# Patient Record
Sex: Female | Born: 1950
Health system: Southern US, Community
[De-identification: ages and names within clinical notes are randomized; demographics above are authoritative.]

## PROBLEM LIST (undated history)

## (undated) DIAGNOSIS — R0600 Dyspnea, unspecified: Secondary | ICD-10-CM

## (undated) DIAGNOSIS — K449 Diaphragmatic hernia without obstruction or gangrene: Secondary | ICD-10-CM

## (undated) DIAGNOSIS — F1210 Cannabis abuse, uncomplicated: Secondary | ICD-10-CM

## (undated) DIAGNOSIS — F1410 Cocaine abuse, uncomplicated: Secondary | ICD-10-CM

## (undated) DIAGNOSIS — K319 Disease of stomach and duodenum, unspecified: Secondary | ICD-10-CM

## (undated) DIAGNOSIS — IMO0002 Reserved for concepts with insufficient information to code with codable children: Secondary | ICD-10-CM

## (undated) DIAGNOSIS — N186 End stage renal disease: Secondary | ICD-10-CM

## (undated) DIAGNOSIS — J449 Chronic obstructive pulmonary disease, unspecified: Secondary | ICD-10-CM

## (undated) DIAGNOSIS — E785 Hyperlipidemia, unspecified: Secondary | ICD-10-CM

## (undated) DIAGNOSIS — N179 Acute kidney failure, unspecified: Secondary | ICD-10-CM

## (undated) DIAGNOSIS — K222 Esophageal obstruction: Secondary | ICD-10-CM

## (undated) DIAGNOSIS — K859 Acute pancreatitis without necrosis or infection, unspecified: Secondary | ICD-10-CM

## (undated) DIAGNOSIS — I639 Cerebral infarction, unspecified: Secondary | ICD-10-CM

## (undated) DIAGNOSIS — N189 Chronic kidney disease, unspecified: Secondary | ICD-10-CM

## (undated) DIAGNOSIS — Z992 Dependence on renal dialysis: Secondary | ICD-10-CM

## (undated) DIAGNOSIS — M1990 Unspecified osteoarthritis, unspecified site: Secondary | ICD-10-CM

## (undated) DIAGNOSIS — K2980 Duodenitis without bleeding: Secondary | ICD-10-CM

## (undated) DIAGNOSIS — R531 Weakness: Secondary | ICD-10-CM

## (undated) DIAGNOSIS — B1920 Unspecified viral hepatitis C without hepatic coma: Secondary | ICD-10-CM

## (undated) DIAGNOSIS — M5412 Radiculopathy, cervical region: Secondary | ICD-10-CM

## (undated) DIAGNOSIS — K219 Gastro-esophageal reflux disease without esophagitis: Secondary | ICD-10-CM

## (undated) DIAGNOSIS — K3189 Other diseases of stomach and duodenum: Secondary | ICD-10-CM

## (undated) DIAGNOSIS — I10 Essential (primary) hypertension: Secondary | ICD-10-CM

## (undated) HISTORY — DX: Acute kidney failure, unspecified: N17.9

## (undated) HISTORY — DX: Reserved for concepts with insufficient information to code with codable children: IMO0002

## (undated) HISTORY — DX: Other diseases of stomach and duodenum: K31.89

## (undated) HISTORY — PX: REPAIR OF PERFORATED ULCER: SHX6065

## (undated) HISTORY — DX: Unspecified viral hepatitis C without hepatic coma: B19.20

## (undated) HISTORY — DX: Essential (primary) hypertension: I10

## (undated) HISTORY — DX: Unspecified osteoarthritis, unspecified site: M19.90

## (undated) HISTORY — DX: Diaphragmatic hernia without obstruction or gangrene: K44.9

## (undated) HISTORY — DX: Duodenitis without bleeding: K29.80

## (undated) HISTORY — DX: Esophageal obstruction: K22.2

## (undated) HISTORY — DX: Radiculopathy, cervical region: M54.12

## (undated) HISTORY — DX: Disease of stomach and duodenum, unspecified: K31.9

## (undated) HISTORY — DX: Acute pancreatitis without necrosis or infection, unspecified: K85.90

## (undated) HISTORY — DX: Hyperlipidemia, unspecified: E78.5

---
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
## 1977-06-09 HISTORY — PX: ABDOMINAL HYSTERECTOMY: SHX81

## 1985-06-09 DIAGNOSIS — B1920 Unspecified viral hepatitis C without hepatic coma: Secondary | ICD-10-CM

## 1985-06-09 HISTORY — DX: Unspecified viral hepatitis C without hepatic coma: B19.20

## 1988-06-09 DIAGNOSIS — IMO0002 Reserved for concepts with insufficient information to code with codable children: Secondary | ICD-10-CM

## 1988-06-09 HISTORY — DX: Reserved for concepts with insufficient information to code with codable children: IMO0002

## 1988-06-09 HISTORY — PX: REPAIR OF PERFORATED ULCER: SHX6065

## 1998-06-09 DIAGNOSIS — K859 Acute pancreatitis without necrosis or infection, unspecified: Secondary | ICD-10-CM

## 1998-06-09 HISTORY — DX: Acute pancreatitis without necrosis or infection, unspecified: K85.90

## 2003-05-27 DIAGNOSIS — F1210 Cannabis abuse, uncomplicated: Secondary | ICD-10-CM

## 2003-05-27 HISTORY — DX: Cannabis abuse, uncomplicated: F12.10

## 2003-06-04 ENCOUNTER — Emergency Department (HOSPITAL_COMMUNITY): Admission: EM | Admit: 2003-06-04 | Discharge: 2003-06-05 | Payer: Self-pay | Admitting: Internal Medicine

## 2003-06-18 ENCOUNTER — Emergency Department (HOSPITAL_COMMUNITY): Admission: EM | Admit: 2003-06-18 | Discharge: 2003-06-19 | Payer: Self-pay | Admitting: Emergency Medicine

## 2004-08-04 ENCOUNTER — Emergency Department (HOSPITAL_COMMUNITY): Admission: EM | Admit: 2004-08-04 | Discharge: 2004-08-04 | Payer: Self-pay | Admitting: Family Medicine

## 2004-08-17 ENCOUNTER — Emergency Department (HOSPITAL_COMMUNITY): Admission: EM | Admit: 2004-08-17 | Discharge: 2004-08-17 | Payer: Self-pay | Admitting: Emergency Medicine

## 2004-08-21 ENCOUNTER — Ambulatory Visit: Payer: Self-pay | Admitting: Family Medicine

## 2004-08-21 ENCOUNTER — Ambulatory Visit: Payer: Self-pay | Admitting: *Deleted

## 2004-12-20 ENCOUNTER — Emergency Department (HOSPITAL_COMMUNITY): Admission: EM | Admit: 2004-12-20 | Discharge: 2004-12-20 | Payer: Self-pay | Admitting: Emergency Medicine

## 2004-12-31 ENCOUNTER — Emergency Department (HOSPITAL_COMMUNITY): Admission: EM | Admit: 2004-12-31 | Discharge: 2005-01-01 | Payer: Self-pay | Admitting: Emergency Medicine

## 2005-04-27 ENCOUNTER — Emergency Department (HOSPITAL_COMMUNITY): Admission: EM | Admit: 2005-04-27 | Discharge: 2005-04-27 | Payer: Self-pay | Admitting: Emergency Medicine

## 2005-05-11 ENCOUNTER — Emergency Department (HOSPITAL_COMMUNITY): Admission: EM | Admit: 2005-05-11 | Discharge: 2005-05-12 | Payer: Self-pay | Admitting: *Deleted

## 2005-05-28 ENCOUNTER — Emergency Department (HOSPITAL_COMMUNITY): Admission: EM | Admit: 2005-05-28 | Discharge: 2005-05-28 | Payer: Self-pay | Admitting: Emergency Medicine

## 2006-06-09 DIAGNOSIS — I10 Essential (primary) hypertension: Secondary | ICD-10-CM

## 2006-06-09 HISTORY — DX: Essential (primary) hypertension: I10

## 2007-03-16 ENCOUNTER — Emergency Department (HOSPITAL_COMMUNITY): Admission: EM | Admit: 2007-03-16 | Discharge: 2007-03-16 | Payer: Self-pay | Admitting: Emergency Medicine

## 2007-03-30 ENCOUNTER — Emergency Department (HOSPITAL_COMMUNITY): Admission: EM | Admit: 2007-03-30 | Discharge: 2007-03-30 | Payer: Self-pay | Admitting: Emergency Medicine

## 2007-05-17 ENCOUNTER — Emergency Department (HOSPITAL_COMMUNITY): Admission: EM | Admit: 2007-05-17 | Discharge: 2007-05-17 | Payer: Self-pay | Admitting: Emergency Medicine

## 2007-11-18 ENCOUNTER — Emergency Department (HOSPITAL_COMMUNITY): Admission: EM | Admit: 2007-11-18 | Discharge: 2007-11-18 | Payer: Self-pay | Admitting: Emergency Medicine

## 2008-01-09 ENCOUNTER — Emergency Department (HOSPITAL_COMMUNITY): Admission: EM | Admit: 2008-01-09 | Discharge: 2008-01-09 | Payer: Self-pay | Admitting: Emergency Medicine

## 2008-02-06 ENCOUNTER — Emergency Department (HOSPITAL_COMMUNITY): Admission: EM | Admit: 2008-02-06 | Discharge: 2008-02-07 | Payer: Self-pay | Admitting: Emergency Medicine

## 2008-02-09 ENCOUNTER — Emergency Department (HOSPITAL_COMMUNITY): Admission: EM | Admit: 2008-02-09 | Discharge: 2008-02-09 | Payer: Self-pay | Admitting: Family Medicine

## 2008-02-17 ENCOUNTER — Emergency Department (HOSPITAL_COMMUNITY): Admission: EM | Admit: 2008-02-17 | Discharge: 2008-02-17 | Payer: Self-pay | Admitting: Emergency Medicine

## 2008-06-17 ENCOUNTER — Emergency Department (HOSPITAL_COMMUNITY): Admission: EM | Admit: 2008-06-17 | Discharge: 2008-06-17 | Payer: Self-pay | Admitting: Emergency Medicine

## 2008-06-20 ENCOUNTER — Emergency Department (HOSPITAL_COMMUNITY): Admission: EM | Admit: 2008-06-20 | Discharge: 2008-06-21 | Payer: Self-pay | Admitting: Emergency Medicine

## 2008-08-08 ENCOUNTER — Ambulatory Visit: Payer: Self-pay | Admitting: Cardiology

## 2008-08-08 ENCOUNTER — Inpatient Hospital Stay (HOSPITAL_COMMUNITY): Admission: EM | Admit: 2008-08-08 | Discharge: 2008-08-12 | Payer: Self-pay | Admitting: Emergency Medicine

## 2008-08-09 ENCOUNTER — Encounter (INDEPENDENT_AMBULATORY_CARE_PROVIDER_SITE_OTHER): Payer: Self-pay | Admitting: *Deleted

## 2008-08-09 ENCOUNTER — Encounter: Payer: Self-pay | Admitting: Internal Medicine

## 2008-08-09 LAB — CONVERTED CEMR LAB: TSH: 3.172 microintl units/mL

## 2008-08-17 ENCOUNTER — Ambulatory Visit: Payer: Self-pay | Admitting: Cardiology

## 2008-10-02 ENCOUNTER — Emergency Department (HOSPITAL_COMMUNITY): Admission: EM | Admit: 2008-10-02 | Discharge: 2008-10-02 | Payer: Self-pay | Admitting: Emergency Medicine

## 2008-10-21 ENCOUNTER — Emergency Department (HOSPITAL_COMMUNITY): Admission: EM | Admit: 2008-10-21 | Discharge: 2008-10-21 | Payer: Self-pay | Admitting: Emergency Medicine

## 2008-11-13 DIAGNOSIS — K5900 Constipation, unspecified: Secondary | ICD-10-CM | POA: Insufficient documentation

## 2008-11-13 DIAGNOSIS — I10 Essential (primary) hypertension: Secondary | ICD-10-CM | POA: Insufficient documentation

## 2008-11-13 DIAGNOSIS — K279 Peptic ulcer, site unspecified, unspecified as acute or chronic, without hemorrhage or perforation: Secondary | ICD-10-CM | POA: Insufficient documentation

## 2008-12-07 ENCOUNTER — Emergency Department (HOSPITAL_COMMUNITY): Admission: EM | Admit: 2008-12-07 | Discharge: 2008-12-07 | Payer: Self-pay | Admitting: Emergency Medicine

## 2009-01-02 ENCOUNTER — Emergency Department (HOSPITAL_COMMUNITY): Admission: EM | Admit: 2009-01-02 | Discharge: 2009-01-02 | Payer: Self-pay | Admitting: Emergency Medicine

## 2009-01-20 ENCOUNTER — Emergency Department (HOSPITAL_COMMUNITY): Admission: EM | Admit: 2009-01-20 | Discharge: 2009-01-20 | Payer: Self-pay | Admitting: Emergency Medicine

## 2009-02-16 ENCOUNTER — Encounter: Payer: Self-pay | Admitting: Cardiology

## 2009-02-23 ENCOUNTER — Emergency Department (HOSPITAL_COMMUNITY): Admission: EM | Admit: 2009-02-23 | Discharge: 2009-02-23 | Payer: Self-pay | Admitting: Emergency Medicine

## 2009-03-03 ENCOUNTER — Emergency Department (HOSPITAL_COMMUNITY): Admission: EM | Admit: 2009-03-03 | Discharge: 2009-03-03 | Payer: Self-pay | Admitting: Emergency Medicine

## 2009-04-08 ENCOUNTER — Emergency Department (HOSPITAL_COMMUNITY): Admission: EM | Admit: 2009-04-08 | Discharge: 2009-04-08 | Payer: Self-pay | Admitting: Emergency Medicine

## 2009-04-19 ENCOUNTER — Emergency Department (HOSPITAL_COMMUNITY): Admission: EM | Admit: 2009-04-19 | Discharge: 2009-04-19 | Payer: Self-pay | Admitting: Emergency Medicine

## 2009-06-11 ENCOUNTER — Emergency Department (HOSPITAL_COMMUNITY): Admission: EM | Admit: 2009-06-11 | Discharge: 2009-06-11 | Payer: Self-pay | Admitting: Emergency Medicine

## 2009-06-22 ENCOUNTER — Encounter (INDEPENDENT_AMBULATORY_CARE_PROVIDER_SITE_OTHER): Payer: Self-pay | Admitting: *Deleted

## 2009-06-22 LAB — CONVERTED CEMR LAB
ALT: 21 units/L
AST: 21 units/L
Albumin: 4.2 g/dL
Alkaline Phosphatase: 73 units/L
Bilirubin, Direct: 0.1 mg/dL
Cholesterol: 210 mg/dL
HDL: 57 mg/dL
LDL Cholesterol: 129 mg/dL
Total Protein: 7.2 g/dL
Triglycerides: 119 mg/dL

## 2009-06-24 ENCOUNTER — Emergency Department (HOSPITAL_COMMUNITY): Admission: EM | Admit: 2009-06-24 | Discharge: 2009-06-24 | Payer: Self-pay | Admitting: Emergency Medicine

## 2009-07-02 ENCOUNTER — Encounter: Payer: Self-pay | Admitting: Cardiology

## 2009-07-04 ENCOUNTER — Encounter (INDEPENDENT_AMBULATORY_CARE_PROVIDER_SITE_OTHER): Payer: Self-pay | Admitting: *Deleted

## 2009-07-11 ENCOUNTER — Emergency Department (HOSPITAL_COMMUNITY): Admission: EM | Admit: 2009-07-11 | Discharge: 2009-07-11 | Payer: Self-pay | Admitting: Emergency Medicine

## 2009-07-18 ENCOUNTER — Ambulatory Visit: Payer: Self-pay | Admitting: Cardiology

## 2009-07-18 DIAGNOSIS — R29818 Other symptoms and signs involving the nervous system: Secondary | ICD-10-CM | POA: Insufficient documentation

## 2009-07-18 DIAGNOSIS — E785 Hyperlipidemia, unspecified: Secondary | ICD-10-CM | POA: Insufficient documentation

## 2009-07-18 DIAGNOSIS — E782 Mixed hyperlipidemia: Secondary | ICD-10-CM | POA: Insufficient documentation

## 2009-08-01 ENCOUNTER — Ambulatory Visit: Payer: Self-pay | Admitting: Cardiology

## 2009-10-04 ENCOUNTER — Encounter (INDEPENDENT_AMBULATORY_CARE_PROVIDER_SITE_OTHER): Payer: Self-pay | Admitting: *Deleted

## 2009-12-19 ENCOUNTER — Encounter: Payer: Self-pay | Admitting: Adult Health

## 2009-12-24 ENCOUNTER — Ambulatory Visit: Payer: Self-pay | Admitting: Cardiology

## 2009-12-24 ENCOUNTER — Encounter (INDEPENDENT_AMBULATORY_CARE_PROVIDER_SITE_OTHER): Payer: Self-pay | Admitting: *Deleted

## 2009-12-24 DIAGNOSIS — F17200 Nicotine dependence, unspecified, uncomplicated: Secondary | ICD-10-CM | POA: Insufficient documentation

## 2009-12-25 LAB — CONVERTED CEMR LAB
ALT: 10 units/L (ref 0–35)
AST: 15 units/L (ref 0–37)
Albumin: 4.1 g/dL (ref 3.5–5.2)
Alkaline Phosphatase: 64 units/L (ref 39–117)
BUN: 11 mg/dL (ref 6–23)
Bilirubin, Direct: 0.1 mg/dL (ref 0.0–0.3)
CO2: 22 meq/L (ref 19–32)
Calcium: 10.1 mg/dL (ref 8.4–10.5)
Chloride: 107 meq/L (ref 96–112)
Cholesterol: 290 mg/dL — ABNORMAL HIGH (ref 0–200)
Creatinine, Ser: 1.12 mg/dL (ref 0.40–1.20)
Glucose, Bld: 99 mg/dL (ref 70–99)
HDL: 38 mg/dL — ABNORMAL LOW (ref >39)
Indirect Bilirubin: 0.2 mg/dL (ref 0.0–0.9)
LDL Cholesterol: 216 mg/dL — ABNORMAL HIGH (ref 0–99)
Potassium: 4.1 meq/L (ref 3.5–5.3)
Sodium: 141 meq/L (ref 135–145)
Total Bilirubin: 0.3 mg/dL (ref 0.3–1.2)
Total CHOL/HDL Ratio: 7.6
Total Protein: 7.6 g/dL (ref 6.0–8.3)
Triglycerides: 181 mg/dL — ABNORMAL HIGH (ref <150)
VLDL: 36 mg/dL (ref 0–40)

## 2010-07-11 NOTE — Assessment & Plan Note (Signed)
Summary: rov   Primary Provider:  None   History of Present Illness: [name] returns to the office as scheduled for continued assessment and treatment of hypertension, chest discomfort and hyperlipidemia.  She reports no new medical problems in recent months.  She has continuing lower back pain, which is her principal day to day issue.  This is exacerbated by movement and relieved by rest.  Tylenol provides some benefit, but Ultram has been more effective.  She is reluctant to try Naprosyn or ibuprofen, even with the addition of a PPI, due to her history of severe peptic ulcer disease.    She occasionally experiences chest discomfort that is poorly characterized and that passes spontaneously.  This may be associated with exertion and is accompanied by dyspnea.  She has had no nitroglycerin for these symptoms.    She has not follow blood pressures.  She is unaware of her lipid levels.  EKG  Procedure date:  12/24/2009  Findings:      Normal sinus rhythm Within normal limits No previous tracings for comparison.   Current Medications (verified): 1)  Amlodipine Besylate 10 Mg Tabs (Amlodipine Besylate) .... Take 1 Tablet By Mouth Once Daily 2)  Lisinopril 10 Mg Tabs (Lisinopril) .... Take 1 Tablet By Mouth Once Daily 3)  Pravastatin Sodium 80 Mg Tabs (Pravastatin Sodium) .... Take One Tablet By Mouth Daily At Bedtime 4)  Ultracet 37.5-325 Mg Tabs (Tramadol-Acetaminophen) .... Take 1 Tablet By Mouth Three Times A Day As Needed  Allergies: 1)  ! Asa 2)  ! * Ibuprofen  Past History:  PMH, FH, and Social History reviewed and updated.  Past Medical History: Chest discomfort; negative stress nuclear study in 2010 Hypertension Hyperlipidemia Tobacco abuse Gastric ulcer with perforation requiring surgical intervention in the 1980s  Social History: Tobacco Use - 50 pack years; continuing at 5 cigarettes per day Drug Use - yes cocaine, marijuana Disabled  unemployed  Review  of Systems       See history of present illness.  Vital Signs:  Patient profile:   60 year old female Weight:      163 pounds O2 Sat:      97 % on Room air Pulse rate:   79 / minute BP sitting:   129 / 81  (left arm)  Vitals Entered By: Jeani Hawking Via LPN (July 18, 624THL QA348G PM)  O2 Flow:  Room air  Physical Exam  General:   General-Well developed; no acute distress:   Neck-No JVD; no carotid bruits: Lungs-No tachypnea, no rales; no rhonchi; no wheezes: Cardiovascular-normal PMI; normal S1 and S2; modest systolic murmur Abdomen-BS normal; soft and non-tender without masses or organomegaly:  Musculoskeletal-No deformities, no cyanosis or clubbing: Neurologic-Normal cranial nerves; symmetric strength and tone:  Skin-Warm, no significant lesions: Extremities-Nl distal pulses; no edema:     Impression & Recommendations:  Problem # 1:  TOBACCO ABUSE (ICD-305.1) Patient encouraged once again to quit.  She is not inclined to utilize any pharmacologic aids at the present time.  Problem # 2:  MUSCULOSKELETAL PAIN (ICD-781.99) Prescription for Ultram renewed.  Lumbosacral spine films will be obtained.  We are attempting to locate a source for primary care for her, either the free clinic or a local physician's office.  Problem # 3:  HYPERLIPIDEMIA (P102836.4) LDL is in excess of 200, which is an indication for pharmacologic therapy.  Pravastatin 80 mg q.d. will be added to her medical regime with a repeat lipid profile in one month.  Problem # 4:  HYPERTENSION (ICD-401.9) Blood pressure control is excellent; current medications will be continued.  I will reevaluate this nice woman in 7 months.  Other Orders: T-Lumbar Spine Complete, 5 Views [id number]) Future Orders: T-Lipid Profile [id number] ... 01/24/2010  Patient Instructions: 1)  Your physician recommends that you schedule a follow-up appointment in: 7 months 2)  Your physician recommends that you return for lab work in: 1  month 3)  Your physician has recommended you make the following change in your medication: pravachol 80mg  daily, ultram 50mg  1-2 tablets three times a day as needed 4)  You have been referred to [facility] for referral to medicaid, free clinic referral also 5)  Your physician discussed the hazards of tobacco use.  Tobacco use cessation is recommended and techniques and options to help you quit were discussed. Prescriptions: ULTRACET 37.5-325 MG TABS (TRAMADOL-ACETAMINOPHEN) Take 1 tablet by mouth three times a day as needed  #90 x 6   Entered by:   Tye Savoy RN   Authorized by:   Yehuda Savannah, MD, [facility]   Signed by:   Tye Savoy RN on 12/24/2009   Method used:   Electronically to        [facility] (retail)       [place] [address]       [place], [place]  [address]       Ph: [phone]       Fax: [phone]   RxID:   [id number] ULTRAM 50 MG TABS (TRAMADOL HCL) 1-2 tablets by mouth three times a day as needed  #90 x 6   Entered by:   Tye Savoy RN   Authorized by:   Yehuda Savannah, MD, [facility]   Signed by:   Tye Savoy RN on 12/24/2009   Method used:   Electronically to        [facility] (retail)       [place] [address]       [place], [place]  [address]       Ph: [phone]       Fax: [phone]   RxID:   [id number] PRAVASTATIN SODIUM 80 MG TABS (PRAVASTATIN SODIUM) Take one tablet by mouth daily at bedtime  #30 x 6   Entered by:   Tye Savoy RN   Authorized by:   Yehuda Savannah, MD, [facility]   Signed by:   Tye Savoy RN on 12/24/2009   Method used:   Electronically to        [facility] (retail)       [place] [address]       [place], [place]  [address]       Ph: [phone]       Fax: [phone]   RxID:   [id number]

## 2010-07-11 NOTE — Miscellaneous (Signed)
Summary: lisinopril hctz refill  Clinical Lists Changes  Medications: Changed medication from LISINOPRIL-HYDROCHLOROTHIAZIDE 20-25 MG TABS (LISINOPRIL-HYDROCHLOROTHIAZIDE) Take 1 tablet by mouth once a day to LISINOPRIL-HYDROCHLOROTHIAZIDE 20-25 MG TABS (LISINOPRIL-HYDROCHLOROTHIAZIDE) Take 1 tablet by mouth once a day - Signed Rx of LISINOPRIL-HYDROCHLOROTHIAZIDE 20-25 MG TABS (LISINOPRIL-HYDROCHLOROTHIAZIDE) Take 1 tablet by mouth once a day;  #30 x 1;  Signed;  Entered by: Tye Savoy RN;  Authorized by: Renella Cunas, MD, [facility];  Method used: Electronically to [facility] [place], [place], [place], [place]  [address], Ph: [phone], Fax: [phone]    Prescriptions: LISINOPRIL-HYDROCHLOROTHIAZIDE 20-25 MG TABS (LISINOPRIL-HYDROCHLOROTHIAZIDE) Take 1 tablet by mouth once a day  #30 x 1   Entered by:   Tye Savoy RN   Authorized by:   Renella Cunas, MD, [facility]   Signed by:   Tye Savoy RN on 07/02/2009   Method used:   Electronically to        [facility] (retail)       [place] [address]       [place], [place]  [address]       Ph: [phone]       Fax: [phone]   RxID:   [id number]

## 2010-07-11 NOTE — Assessment & Plan Note (Signed)
Summary: PAST DUE FOR F/U PER TAMMY/TG   Visit Type:  Follow-up Primary Provider:  no primary m.d   History of Present Illness: Ashley [name] is a 60 y/o AAF who we are following with known history of hypertension. hyperlipidemia and chest pain.  She is a former cocaine abuser. She was admitted to [facility] in March of 2010 and was ruled out for MI with myovew study. She does not have a primary care provider and is in need of medication refills.  She states she is complaint with medications and has not used cocaine for 1 year. She continues to smoke but is cutting down. 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  Preventive Screening-Counseling & Management  Alcohol-Tobacco     Smoking Status: current     Smoking Cessation Counseling: yes  Current Medications (verified): 1)  Amlodipine Besylate 10 Mg Tabs (Amlodipine Besylate) .... Take 1 Tablet By Mouth Once Daily 2)  Lisinopril 10 Mg Tabs (Lisinopril) .... Take 1 Tablet By Mouth Once Daily 3)  Potassium Chloride Crys Cr 20 Meq Cr-Tabs (Potassium Chloride Crys Cr) .... Take 1 Tablet By Mouth Once A Day 4)  Flexeril 10 Mg Tabs (Cyclobenzaprine Hcl) .... Take 1 Tablet By Mouth Two Times A Day 5)  Ultram 50 Mg Tabs (Tramadol Hcl) .... Take 1 Tablet By Mouth Three Times A Day As Needed For Pain  Allergies (verified): 1)  ! Asa  Past History:  Past medical, surgical, family and social histories (including risk factors) reviewed, and no changes noted (except as noted below).  Past Medical History: Reviewed history from 11/13/2008 and no changes required. Current Problems:  CONSTIPATION (ICD-564.00) GASTRIC ULCER (ICD-531.90) HYPERTENSION (ICD-401.9) PMH-FH-SH reviewed-no changes except otherwise noted  Family History: Reviewed history and no changes required.  Social History: Reviewed history from 11/13/2008 and no changes required. Tobacco Use - Yes.  Drug Use - yes cocaine, marijuana Disabled   unemployed  Review of Systems       generalized fatigue , back pain All other systems have been reviewed and are negative unless stated above.    Vital Signs:  Patient profile:   60 year old female Height:      65 inches Weight:      172 pounds BMI:     28.73 Pulse rate:   87 / minute BP sitting:   91 / 57  (right arm)  Vitals Entered By: Doretha Sou, CNA (July 18, 2009 2:25 PM)  Physical Exam  General:  Well developed, well nourished, in no acute distress. Head:  normocephalic and atraumatic Eyes:  PERRLA/EOM intact; conjunctiva and lids normal.Glases  Lungs:  Clear bilaterally to auscultation and percussion. Heart:  Non-displaced PMI, chest non-tender; regular rate and rhythm, S1, S2 without murmurs, rubs or gallops. Carotid upstroke normal, no bruit. Normal abdominal aortic size, no bruits. Femorals normal pulses, no bruits. Pedals normal pulses. No edema, no varicosities. Abdomen:  Bowel sounds positive; abdomen soft and non-tender without masses, organomegaly, or hernias noted. No hepatosplenomegaly. Msk:  Back normal, normal gait. Muscle strength and tone normal. Extremities:  No clubbing or cyanosis. Neurologic:  Alert and oriented x 3. Psych:  Normal affect.   Impression & Recommendations:  Problem # 1:  HYPERTENSION (ICD-401.9) Her blood pressure is much lower than previously documeted. Rechecked manually found to be 110/60.  She is having some dizziness.  I will decrease her Lisinopril to 10mg  daily.  She will continue other medications as directed.  Will check  a BMET for kidney function with use of ACE and HCTZ Her updated medication list for this problem includes:    Amlodipine Besylate 10 Mg Tabs (Amlodipine besylate) .Marland Kitchen... Take 1 tablet by mouth once daily    Lisinopril 10 Mg Tabs (Lisinopril) .Marland Kitchen... Take 1 tablet by mouth once daily  Orders: T-Basic Metabolic Panel ([id number])  Problem # 2:  HYPERLIPIDEMIA (P102836.4) Will check lipids to ascertain  need for statin. Orders: T-Lipid Profile [id number]) T-Hepatic Function [id number])  Problem # 3:  MUSCULOSKELETAL PAIN (ICD-781.99) I have prescribed flexeril 10mg  three times a day as needed back spams and ultram for pain.  She requested Vicodin.  This was not provided.  Patient Instructions: 1)  Your physician recommends that you schedule a follow-up appointment in: 2 weeks for BP check and in 1 month with MD 2)  Your physician recommends that you return for lab work in: Next week 3)  Your physician has recommended you make the following change in your medication: Decrease Lisinopril to 10mg  by mouth once daily , start taking Flexeril and Ultram Prescriptions: LISINOPRIL 10 MG TABS (LISINOPRIL) take 1 tablet by mouth once daily  #30 x 6   Entered by:   Ashley [name] Via LPN   Authorized by:   Ashley Sims, NP   Signed by:   Ashley [name] Via LPN on 624THL   Method used:   Electronically to        [facility] (retail)       [place] [address] [place], [place]  [address]       Ph: [phone]       Fax: [phone]   RxID:   [id number] ULTRAM 50 MG TABS (TRAMADOL HCL) take 1 tablet by mouth three times a day as needed for pain  #60 x 0   Entered by:   Ashley [name] Via LPN   Authorized by:   Ashley Sims, NP   Signed by:   Ashley [name] Via LPN on 624THL   Method used:   Electronically to        [facility] (retail)       [place] [address] [place], [place]  [address]       Ph: [phone]       Fax: [phone]   RxID:   [id number] FLEXERIL 10 MG TABS (CYCLOBENZAPRINE HCL) take 1 tablet by mouth two times a day  #60 x 0   Entered by:   Ashley [name] Via LPN   Authorized by:   Ashley Sims, NP   Signed by:   Ashley [name] Via LPN on 624THL   Method used:   Electronically to        [facility] (retail)       [place] [address] [place], [place]  [address]       Ph: [phone]        Fax: [phone]   RxID:   [id number]

## 2010-07-11 NOTE — Miscellaneous (Signed)
Summary: labs lipids,liver,06/21/2009  Clinical Lists Changes  Observations: Added new observation of ALBUMIN: 4.2 g/dL (06/22/2009 15:20) Added new observation of PROTEIN, TOT: 7.2 g/dL (06/22/2009 15:20) Added new observation of SGPT (ALT): 21 units/L (06/22/2009 15:20) Added new observation of SGOT (AST): 21 units/L (06/22/2009 15:20) Added new observation of ALK PHOS: 73 units/L (06/22/2009 15:20) Added new observation of BILI DIRECT: 0.1 mg/dL (06/22/2009 15:20) Added new observation of LDL: 129 mg/dL (06/22/2009 15:20) Added new observation of HDL: 57 mg/dL (06/22/2009 15:20) Added new observation of TRIGLYC TOT: 119 mg/dL (06/22/2009 15:20) Added new observation of CHOLESTEROL: 210 mg/dL (06/22/2009 15:20)

## 2010-07-11 NOTE — Assessment & Plan Note (Signed)
Summary: BP CHECK  Nurse Visit   Vital Signs:  Patient profile:   60 year old female Height:      65 inches Weight:      173 pounds O2 Sat:      98 % on Room air Pulse rate:   99 / minute BP sitting:   106 / 79  (right arm)  Vitals Entered By: Doretha Sou, CNA (August 01, 2009 3:57 PM)  O2 Flow:  Room air  Current Medications (verified): 1)  Amlodipine Besylate 10 Mg Tabs (Amlodipine Besylate) .... Take 1 Tablet By Mouth Once Daily 2)  Lisinopril 10 Mg Tabs (Lisinopril) .... Take 1/2  Tablet By Mouth Once Daily 3)  Flexeril 10 Mg Tabs (Cyclobenzaprine Hcl) .... Take 1 Tablet By Mouth Two Times A Day 4)  Ultram 50 Mg Tabs (Tramadol Hcl) .... Take 1 Tablet By Mouth Three Times A Day As Needed For Pain  Allergies (verified): 1)  ! Asa   Visit Type:  bp check Primary Provider:  no primary m.d   History of Present Illness: shortness of breath on exertion, no other complaints, lisinopril was decreased at last ov to 10mg  daily

## 2010-07-11 NOTE — Letter (Signed)
Summary: [facility], general practice at [place]. [address] [place] [address]   Phone: [phone]  Fax: [phone]     December 24, 2009 MRN: [id number]   [facility] [address] [place], [place]  [address]      YOUR LAB WORK IS DUE   January 24, 2010  Please go to [facility], located across the street from [facility] on the second floor.  Hours are Monday - Friday 7am until 7:30pm         Saturday 8am until 12noon    _X_  DO NOT EAT OR DRINK AFTER MIDNIGHT EVENING PRIOR TO LABWORK  __ YOUR LABWORK IS NOT FASTING --YOU MAY EAT PRIOR TO LABWORK

## 2010-07-11 NOTE — Miscellaneous (Signed)
Summary: labs tsh 08/09/2008  Clinical Lists Changes  Observations: Added new observation of TSH: 3.172 microintl units/mL (08/09/2008 16:29)

## 2010-08-22 ENCOUNTER — Inpatient Hospital Stay (INDEPENDENT_AMBULATORY_CARE_PROVIDER_SITE_OTHER)
Admission: RE | Admit: 2010-08-22 | Discharge: 2010-08-22 | Disposition: A | Discharge status: Home / Self Care | Payer: Self-pay | Source: Ambulatory Visit | Attending: Family Medicine | Admitting: Family Medicine

## 2010-08-22 ENCOUNTER — Ambulatory Visit (INDEPENDENT_AMBULATORY_CARE_PROVIDER_SITE_OTHER): Payer: Self-pay

## 2010-08-22 DIAGNOSIS — M545 Low back pain, unspecified: Secondary | ICD-10-CM

## 2010-09-04 ENCOUNTER — Other Ambulatory Visit: Payer: Self-pay | Admitting: Cardiology

## 2010-09-05 NOTE — Telephone Encounter (Signed)
Richey °

## 2010-09-13 LAB — DIFFERENTIAL
Eosinophils Relative: 2 % (ref 0–5)
Lymphocytes Relative: 28 % (ref 12–46)
Monocytes Absolute: 0.7 10*3/uL (ref 0.1–1.0)
Monocytes Relative: 6 % (ref 3–12)
Neutro Abs: 7.1 10*3/uL (ref 1.7–7.7)

## 2010-09-13 LAB — COMPREHENSIVE METABOLIC PANEL
AST: 23 U/L (ref 0–37)
Albumin: 4.2 g/dL (ref 3.5–5.2)
Alkaline Phosphatase: 51 U/L (ref 39–117)
BUN: 23 mg/dL (ref 6–23)
Chloride: 107 mEq/L (ref 96–112)
Creatinine, Ser: 1.94 mg/dL — ABNORMAL HIGH (ref 0.4–1.2)
GFR calc Af Amer: 32 mL/min — ABNORMAL LOW (ref >60)
Potassium: 3.8 mEq/L (ref 3.5–5.1)
Total Protein: 8.1 g/dL (ref 6.0–8.3)

## 2010-09-13 LAB — CBC
HCT: 35.9 % — ABNORMAL LOW (ref 36.0–46.0)
Platelets: 349 10*3/uL (ref 150–400)
RDW: 13.2 % (ref 11.5–15.5)
WBC: 11.3 10*3/uL — ABNORMAL HIGH (ref 4.0–10.5)

## 2010-09-13 LAB — URINALYSIS, ROUTINE W REFLEX MICROSCOPIC
Bilirubin Urine: NEGATIVE
Glucose, UA: NEGATIVE mg/dL
Ketones, ur: NEGATIVE mg/dL
pH: 5.5 (ref 5.0–8.0)

## 2010-09-15 LAB — URINE MICROSCOPIC-ADD ON

## 2010-09-15 LAB — WET PREP, GENITAL: Yeast Wet Prep HPF POC: NONE SEEN

## 2010-09-15 LAB — GC/CHLAMYDIA PROBE AMP, GENITAL: Chlamydia, DNA Probe: NEGATIVE

## 2010-09-15 LAB — URINALYSIS, ROUTINE W REFLEX MICROSCOPIC
Bilirubin Urine: NEGATIVE
Glucose, UA: NEGATIVE mg/dL
Specific Gravity, Urine: 1.015 (ref 1.005–1.030)
pH: 6 (ref 5.0–8.0)

## 2010-09-19 LAB — BASIC METABOLIC PANEL
BUN: 11 mg/dL (ref 6–23)
BUN: 7 mg/dL (ref 6–23)
BUN: 8 mg/dL (ref 6–23)
CO2: 26 mEq/L (ref 19–32)
CO2: 28 mEq/L (ref 19–32)
Calcium: 8.8 mg/dL (ref 8.4–10.5)
Calcium: 9.1 mg/dL (ref 8.4–10.5)
Chloride: 104 mEq/L (ref 96–112)
Chloride: 105 mEq/L (ref 96–112)
Chloride: 107 mEq/L (ref 96–112)
Creatinine, Ser: 0.96 mg/dL (ref 0.4–1.2)
Creatinine, Ser: 0.97 mg/dL (ref 0.4–1.2)
GFR calc Af Amer: >60 mL/min (ref >60)
GFR calc non Af Amer: 59 mL/min — ABNORMAL LOW (ref >60)
Glucose, Bld: 84 mg/dL (ref 70–99)
Glucose, Bld: 84 mg/dL (ref 70–99)
Glucose, Bld: 96 mg/dL (ref 70–99)
Potassium: 3.3 mEq/L — ABNORMAL LOW (ref 3.5–5.1)
Potassium: 4 mEq/L (ref 3.5–5.1)
Sodium: 138 mEq/L (ref 135–145)

## 2010-09-19 LAB — DIFFERENTIAL
Basophils Absolute: 0 10*3/uL (ref 0.0–0.1)
Basophils Absolute: 0 10*3/uL (ref 0.0–0.1)
Basophils Relative: 1 % (ref 0–1)
Eosinophils Absolute: 0.4 10*3/uL (ref 0.0–0.7)
Eosinophils Absolute: 0.4 10*3/uL (ref 0.0–0.7)
Eosinophils Relative: 6 % — ABNORMAL HIGH (ref 0–5)
Eosinophils Relative: 6 % — ABNORMAL HIGH (ref 0–5)
Lymphocytes Relative: 50 % — ABNORMAL HIGH (ref 12–46)
Lymphs Abs: 3.1 10*3/uL (ref 0.7–4.0)
Lymphs Abs: 3.6 10*3/uL (ref 0.7–4.0)
Monocytes Absolute: 0.8 10*3/uL (ref 0.1–1.0)
Monocytes Absolute: 0.9 10*3/uL (ref 0.1–1.0)
Monocytes Relative: 11 % (ref 3–12)
Neutro Abs: 2.5 10*3/uL (ref 1.7–7.7)
Neutro Abs: 2.7 10*3/uL (ref 1.7–7.7)
Neutrophils Relative %: 34 % — ABNORMAL LOW (ref 43–77)

## 2010-09-19 LAB — POCT CARDIAC MARKERS
CKMB, poc: <1 ng/mL — ABNORMAL LOW (ref 1.0–8.0)
Myoglobin, poc: 48.6 ng/mL (ref 12–200)
Troponin i, poc: <0.05 ng/mL (ref 0.00–0.09)

## 2010-09-19 LAB — PHOSPHORUS: Phosphorus: 3.4 mg/dL (ref 2.3–4.6)

## 2010-09-19 LAB — CBC
HCT: 36.8 % (ref 36.0–46.0)
Hemoglobin: 12.5 g/dL (ref 12.0–15.0)
MCHC: 33.8 g/dL (ref 30.0–36.0)
MCV: 90.3 fL (ref 78.0–100.0)
MCV: 91.9 fL (ref 78.0–100.0)
Platelets: 291 10*3/uL (ref 150–400)
Platelets: 356 10*3/uL (ref 150–400)
RBC: 4.01 MIL/uL (ref 3.87–5.11)
RDW: 14 % (ref 11.5–15.5)
RDW: 14.4 % (ref 11.5–15.5)
WBC: 7.3 10*3/uL (ref 4.0–10.5)
WBC: 7.4 10*3/uL (ref 4.0–10.5)

## 2010-09-19 LAB — MAGNESIUM: Magnesium: 2.1 mg/dL (ref 1.5–2.5)

## 2010-09-19 LAB — CK TOTAL AND CKMB (NOT AT ARMC)
CK, MB: 1 ng/mL (ref 0.3–4.0)
Relative Index: INVALID (ref 0.0–2.5)
Total CK: 47 U/L (ref 7–177)

## 2010-09-19 LAB — CARDIAC PANEL(CRET KIN+CKTOT+MB+TROPI)
Relative Index: INVALID (ref 0.0–2.5)
Troponin I: <0.01 ng/mL (ref 0.00–0.06)

## 2010-09-19 LAB — LIPID PANEL
Cholesterol: 231 mg/dL — ABNORMAL HIGH (ref 0–200)
Total CHOL/HDL Ratio: 4.5 RATIO

## 2010-09-19 LAB — BRAIN NATRIURETIC PEPTIDE: Pro B Natriuretic peptide (BNP): <30 pg/mL (ref 0.0–100.0)

## 2010-09-19 LAB — D-DIMER, QUANTITATIVE: D-Dimer, Quant: 0.47 ug/mL-FEU (ref 0.00–0.48)

## 2010-09-23 LAB — POCT I-STAT, CHEM 8
Calcium, Ion: 1.18 mmol/L (ref 1.12–1.32)
Creatinine, Ser: 1.1 mg/dL (ref 0.4–1.2)
Glucose, Bld: 75 mg/dL (ref 70–99)
Hemoglobin: 14.6 g/dL (ref 12.0–15.0)
Sodium: 140 mEq/L (ref 135–145)
TCO2: 24 mmol/L (ref 0–100)

## 2010-10-12 ENCOUNTER — Other Ambulatory Visit: Payer: Self-pay | Admitting: Cardiology

## 2010-10-22 NOTE — H&P (Signed)
Ashley [name], Ashley [name]                 ACCOUNT NO.:  [id number]   MEDICAL RECORD NO.:  [id number]          PATIENT TYPE:  INP   LOCATION:  A315                          FACILITY:  [facility]   PHYSICIAN:  Salem Caster, DO    DATE OF BIRTH:  1950-08-24   DATE OF ADMISSION:  08/08/2008  DATE OF DISCHARGE:  03/06/2010LH                              HISTORY & PHYSICAL   CHIEF COMPLAINT:  Chest pain.   HISTORY OF PRESENT ILLNESS:  This is a 60 year old female who presented  with left-sided chest pain.  The patient states pain is sharp in nature,  located at left sternal border, it has been on and off for approximately  1 week with radiation to her back.  The patient states that the pain was  worse with deep inspiration and movement.  The patient denies any major  coughing.   PAST MEDICAL HISTORY:  1. Hypertension.  2. Gastric ulcer.  3. Constipation.   PAST SURGICAL HISTORY:  Hysterectomy.   SOCIAL HISTORY:  No history of drug abuse.  She is current smoker,  occasional drinker.   ALLERGIES:  IBUPROFEN and ASPIRIN.   HOME MEDICATIONS:  None.   REVIEW OF SYSTEMS:  CONSTITUTIONAL:  No weight gain, weight loss, fever,  or chills.  HEENT:  Unremarkable.  CARDIOVASCULAR:  Positive for chest  pain.  RESPIRATORY:  No shortness of breath, dyspnea, or wheezing.  GI:  Unremarkable.  GENITOURINARY:  Unremarkable.  MUSCULOSKELETAL:  Positive  for some back pain.  SKIN/NEUROLOGIC/PSYCHIATRIC:  Unremarkable.   PHYSICAL EXAMINATION:  CONSTITUTIONAL:  He is alert, well-developed,  well-nourished, well-hydrated in no acute distress.  HEENT:  Head is atraumatic and normocephalic.  Eyes, PERRL, EOMI.  NECK:  Supple, nontender, and nondistended.  CARDIOVASCULAR:  Regular rate and rhythm.  No murmurs, rubs, or gallops.  LUNGS:  Clear to auscultation bilaterally.  No rales, rhonchi, or  wheezing.  CHEST:  She has some tenderness in the left sternal border, reproducible  on exam.  ABDOMEN:  Soft,  nontender, and nondistended.  Positive bowel sounds.  EXTREMITIES:  No clubbing, cyanosis, or edema.  NEUROLOGICAL:  Cranial nerves II through XII grossly intact.  The  patient moves all extremities.  She is alert and oriented x3.  SKIN:  Normal, dry, and warm.   LABORATORY DATA:  Myoglobin 50.6, CK-MB less than 1, troponin less than  0.05, white count 7.4, hemoglobin 13.0, hematocrit 36.8, and platelet  count 356,000.  Her D-dimer is 0.47.  Sodium 138, potassium 4.0,  chloride 107, CO2 26, glucose 84, BUN 11, and creatinine is 1.09.  Chest  x-ray showed no acute abnormalities.   ASSESSMENT AND PLAN:  1. Chest pain.  The patient will be ruled out for acute myocardial      infarction.  The patient will have cardiology consult.  I will get      a total of 3 sets of cardiac markers.  We will repeat EKG in the      a.m. also.  The patient will be placed on IV pain medications for      any  pain.  The patient is allergic to ASPIRIN.  We will hold on any      aspirin-like products at this time.  The patient was also found to      be hypertensive, elevated blood pressures.  Medications will be      added for blood pressure control possibly nitroglycerin IV if      needed.  The patient has severe tobacco abuse.  The patient may      need a nicotine patch if needed and also counseling regarding      tobacco cessation.  2. The patient will be placed on deep vein thrombosis as well as      gastrointestinal prophylaxis.  The patient will be followed      closely.  She will be placed on ICU for her blood pressure control      and once this is under control, the patient will be transferred to      general medical bed.  We will await for cardiology recommendations      at this time.      Salem Caster, DO  Electronically Signed     SM/MEDQ  D:  08/13/2008  T:  08/13/2008  Job:  [id number]

## 2010-10-22 NOTE — Consult Note (Signed)
NAMESHAQUISE, Ashley [name]                 ACCOUNT NO.:  [id number]   MEDICAL RECORD NO.:  [id number]          PATIENT TYPE:  INP   LOCATION:  IC04                          FACILITY:  [facility]   PHYSICIAN:  Champ Mungo. Lovena Le, MD    DATE OF BIRTH:  1951/01/04   DATE OF CONSULTATION:  08/09/2008  DATE OF DISCHARGE:                                 CONSULTATION   PRIMARY CARE PHYSICIAN:  None.   REASON FOR CONSULTATION:  Chest pain.   HISTORY OF PRESENT ILLNESS:  Ashley [name] is a 60 year old female patient  with a history of hypertension who has been off medication for the last  3 years secondary to finances.  She relocated to this area from [place]  approximately 3 years ago.  She had been taking care of her aunt and her  sister.  She has noted left-sided chest discomfort off and on for the  last week.  It is not necessarily related to exertion.  She thinks it  gets worse with eating at times.  She also notes that it comes on when  she lies down.  It gets better with sitting up.  She denies any  radiating symptoms.  She does have associated shortness of breath that  is mild.  She denies any associated nausea but has had diaphoresis.  She  denies syncope, near syncope.  Her pain worsened, and she eventually  came to the emergency room yesterday.  Her cardiac markers have been  negative thus far.  Her chest x-ray demonstrates no acute disease, and  her EKG demonstrates sinus rhythm with a heart rate of 51, normal axis  and T-wave inversions in V5 and 6.  We are asked to further evaluate.   PAST MEDICAL HISTORY:  1. Hypertension.  2. Hyperlipidemia.  3. Peptic ulcer disease status post prior upper GI bleeding in the      1980s.   ALLERGIES:  NO KNOWN DRUG ALLERGIES.  She does not take aspirin or  NSAIDs secondary to her prior history of peptic ulcer disease.   MEDICATIONS AT HOME:  None.   SOCIAL HISTORY:  The patient lives in [place] with her aunt.  She has  a 30+ pack-year history of  smoking.  Continues to smoke a pack of  cigarettes every 2-days.  She denies alcohol abuse, but she does admit  to marijuana use, as well as occasional cocaine use.  She last used  cocaine about a week ago.  She is currently unemployed.   FAMILY HISTORY:  Her father died from colon cancer.   REVIEW OF SYSTEMS:  Please see HPI.  Denies fevers, chills, sore throat.  She has had a headache.  Denies rash.  Denies dysuria, hematuria.  Denies bright red blood per rectum or melena, although she did describe  some dark tarry stools about 2 months ago, but this resolved.  She does  have some belching symptoms and does describe some dysphagia.  She  denies odynophagia.  She has had some symptoms that are questionable for  paroxysmal nocturnal dyspnea, but she denies orthopnea.  She has had  some mild pedal edema.  She does note a cough that is nonproductive.  She denies syncope or near-syncope.  The rest of the review of systems  are negative.   PHYSICAL EXAMINATION:  GENERAL:  She is a well-nourished, well-developed  female in no acute distress.  VITAL SIGNS:  Blood pressure is 159/87 (her blood pressure did go up to  187/117 in the emergency room last night), pulse 54, respirations 14,  temperature 98.1, oxygen saturation 94% on room air.  HEENT:  Normal.  NECK:  Without JVD.  LYMPH:  Without lymphadenopathy.  ENDOCRINE:  Without thyromegaly.  CARDIAC:  Normal S1 and S2.  Regular rate and rhythm without murmur.  LUNGS:  Clear to auscultation bilaterally without wheezes, rhonchi or  rales.  SKIN:  Without rash.  ABDOMEN:  Soft with normoactive bowel sounds.  No organomegaly.  Questionable epigastric tenderness with palpation.  EXTREMITIES:  Without clubbing, cyanosis or edema.  MUSCULOSKELETAL:  Without joint deformity.  NEUROLOGIC:  She is alert and oriented x3.  Cranial nerves II-XII  grossly intact.  VASCULAR:  Questionable left carotid bruit.  Dorsalis pedis and  posterior tibialis  pulses are 2+ bilaterally.   CHEST X-RAY:  As outlined above.   ELECTROCARDIOGRAM:  As outlined above.   LABORATORY DATA:  White count 7300, hemoglobin 12.2, hematocrit 35.4,  platelet count 291,000.  Sodium 139, potassium 3.3, BUN 7, creatinine  0.97, glucose 95.  BNP less than 30.  D-dimer 0.47.  Point care markers  negative x2.   ASSESSMENT:  1. Atypical chest discomfort.  2. Uncontrolled hypertension.  3. Untreated dyslipidemia.  4. Cocaine abuse.  5. Tobacco abuse.  6. History of peptic ulcer disease status post prior upper      gastrointestinal bleeding many years ago.  7. Questionable left carotid bruit.   RECOMMENDATIONS:  The patient was also seen and examined by Dr. Cristopher Peru.  We will continue to check cardiac enzymes to rule out  myocardial infarction.  An echocardiogram will be obtained.  She has a  headache for nitroglycerin.  This will be discontinued, and she will be  placed on Norvasc 5 mg a day, as well as lisinopril 20 mg a day.  She  may benefit from the addition of a diuretic to her ACE inhibitor if her  blood pressure needs better control.  Her headache will be treated.  Will consider proceeding with a nuclear study in the next 24-48 hours  after her blood pressure is better controlled unless her enzymes return  positive.  She has been encouraged to stop tobacco and cocaine use.  Lipids will also be checked, as well as a TSH.  Will also obtain carotid  Dopplers to rule out significant carotid stenosis, given her  questionable left carotid bruit.   Thank you very much for the consultation.  We will glad to follow the  patient throughout the remainder of this admission.      Richardson Dopp, PA-C      Champ Mungo. Lovena Le, MD  Electronically Signed    SW/MEDQ  D:  08/09/2008  T:  08/09/2008  Job:  [id number]

## 2010-10-22 NOTE — Discharge Summary (Signed)
Ashley [name], Ashley [name]                 ACCOUNT NO.:  [id number]   MEDICAL RECORD NO.:  [id number]          PATIENT TYPE:  INP   LOCATION:  A315                          FACILITY:  [facility]   PHYSICIAN:  Bonnielee Haff, MD     DATE OF BIRTH:  May 07, 1951   DATE OF ADMISSION:  08/08/2008  DATE OF DISCHARGE:  03/06/2010LH                               DISCHARGE SUMMARY   PRIMARY MEDICAL DOCTOR:  The patient does not have a PMD.   DISCHARGE DIAGNOSES:  1. Left-sided chest pain, etiology unclear.  CT scan is pending.  2. Status post stress test likely negative, though full results not      available yet.  3. Uncontrolled hypertension, improved.  4. Tobacco abuse.   BRIEF HOSPITAL COURSE:  This is a 60 year old African American female  who has not seen a doctor in a while, who presented to the hospital with  complaints of left-sided chest pain.  The patient had  nonspecific EKG  findings in the form of T-wave inversions in V5-V6 which flipped up and  down.  The patient was found to have been hypertensive with blood  pressures greater than [id number] and diastolic greater than 0000000.  She was  started on p.o. blood pressure medications.  She had ruled out for acute  coronary syndrome.  She was seen by Cardiology and underwent a stress  test yesterday.  Unfortunately, we do not have the report available.  Dr. Lattie Haw was supposed to call with the results of the stress test,  but I am presuming that since she he did not call, it is most likely  negative.  She has ruled out.  So I think she is okay from that  standpoint to be discharged.  I have provided her with phone numbers for  Dr. Izell Kent office to call on Monday to see if she needs a followup  appointment with them or not.   In the meantime, she continues to have left-sided chest pain which she  mentions off and on that she has been having this for a few months.  She  is very vague about it.  She is always requesting higher doses of her  narcotics.  In order to rule out any other catastrophic process, I have  ordered a CT angio chest today.  If that is negative, she can be  discharged.  A D-dimer of note was negative.   With antihypertensive medications, her blood pressure is better  controlled now.  She will be discharged home on these medications.  On  the day of discharge, the patient is still having left-sided chest pain,  though she was sleeping comfortably before I went to the room.   PHYSICAL EXAMINATION:  VITAL SIGNS:  All stable.  Blood pressure is  148/81, saturation 96% on room air.  LUNGS:  Clear to auscultation.  CARDIOVASCULAR:  S1 and S2 normal, regular without any murmurs.  ABDOMEN:  Soft, nontender and nondistended, bowel sounds are present.  No masses or organomegaly are appreciated.  EXTREMITIES:  Show no edema.   LABORATORY DATA:  No labs available  today.  She is stable for discharge  if the CT is okay.   DISCHARGE MEDICATIONS:  1. Amlodipine 10 mg daily.  2. Hydrochlorothiazide 25 mg daily.  3. Lisinopril 20 mg daily.  4. KCl, that is potassium chloride 40 mEq daily.  5. Percocet 5/325 1 tablet every 4 hours as needed for pain, only 10      tablets will be prescribed.   IMPRESSION:  Etiology for the chest pain unclear, most likely  musculoskeletal, could be pleurisy although her chest x-ray did not show  any acute abnormalities.  We also thought of pericarditis, but there was  no pleural effusion noted on echo.  Cardiology did not feel this was  pericarditis.   FOLLOW UP:  1. Follow up with unassigned physician for August 08, 2008.  We will      provide her with a phone number she needs to call.  2. She needs to call Dr. Izell [place] office on Monday to see if she      needs to see them in their office regarding the results of the      stress test.   DIET:  Heart healthy.   PHYSICAL ACTIVITY:  No restrictions.   Smoking cessation counseling was provided.  Apparently she also has done   drugs in the past and this counseling was also provided.   Once the results of the CT are available, she can be discharged if this  is negative.   ADDENDUM: Ct Chest was negative for PE or other acute process. Patient  was subsequently discharged from the hospital. No aspirin as she is  intolerant.      Bonnielee Haff, MD  Electronically Signed     GK/MEDQ  D:  08/12/2008  T:  08/12/2008  Job:  [id number]   cc:   Cristopher Estimable. Lattie Haw, [place], [address]  [place], [place] [address]

## 2010-10-22 NOTE — Assessment & Plan Note (Signed)
[facility] OFFICE NOTE   NAME:Ashley [name], Ashley [name]                        MRN:          [id number]  DATE:08/17/2008                            DOB:          22-Jan-1951    CARDIOLOGIST:  Cristopher Estimable. Lattie Haw, MD, [facility]   PRIMARY CARE PHYSICIAN:  None.   REASON FOR VISIT:  Post-hospitalization followup.   HISTORY OF PRESENT ILLNESS:  Ashley [name] is a 60 year old female patient  with a history of hypertension, hyperlipidemia, peptic ulcer disease who  recently was evaluated in [facility] with chest pain.  She  ruled out for myocardial infarction by enzymes.  She underwent a stress  Myoview study.  She had suboptimal exercise with her heart rates  climbing to 78% of her maximal predicted heart rate.  Her images  demonstrated normal perfusion.  An echocardiogram demonstrated an EF of  60-65% with mild-to-moderate LVH.  She also underwent carotid Dopplers  secondary to a bruit and this demonstrated tortuous carotid systems  without definite hemodynamically significant stenosis.  There was an  elevated velocity.  It could be related to vessel tortuosity, but more  proximal plaque in the right CCA or innominate artery beyond sonographic  field cannot be excluded.  The patient was set up for chest CT angiogram  secondary to continuous chest pain by the hospitalist.  This  demonstrated no pulmonary embolism or thoracic aortic dissection.  She  did have coronary and aortic calcifications.  Of note, her chest CT  angiogram did demonstrate classic proximal brachiocephalic arterial  anatomy without proximal stenosis.  In the office today, she states she  is doing well.  She has not really had any recurrent chest pain.  She  denies any significant shortness of breath, orthopnea, PND, or pedal  edema.  Denies any syncope or near-syncope.  She does note some chest  discomfort with eating at times.  She has also had some  dysphagia, that  was mild.  She has been trying to take Pepcid as needed for dyspepsia.   CURRENT MEDICATIONS:  1. Potassium 20 mEq 2 tablets daily.  2. Hydrochlorothiazide 25 mg daily.  3. Lisinopril 20 mg daily.  4. Amlodipine 10 mg daily.   PHYSICAL EXAMINATION:  GENERAL:  She is a well-nourished well-developed  female in no acute distress.  VITAL SIGNS:  Blood pressure is 100/78, pulse 80, weight 158 pounds.  HEENT:  Normal.  NECK:  Without JVD.  CARDIAC:  S1, S2.  Regular rate and rhythm without murmur.  LUNGS:  Clear to auscultation bilaterally.  ABDOMEN:  Soft, nontender.  EXTREMITIES:  Without edema.  NEUROLOGIC:  She is alert and orient x3.  Cranial nerves II through XII  grossly intact.   ASSESSMENT AND PLAN:  1. Chest pain.  As noted above, the patient had a recent evaluation in      the hospital with a negative stress Myoview study and good left      ventricular function by echocardiogram with just mild-to-moderate      left ventricular hypertrophy.  This was in the setting of  uncontrolled hypertension.  This is improved now.  She does have      some history of peptic ulcer disease as well as evidence of      dyspepsia.  I have recommend that she remain on an H2 blocker on a      regular basis for now.  2. Hypertension.  This is much better controlled on her current      medical regimen.  We will refill her medicines and check a BMET      today to follow up on renal function and potassium.  3. Dyslipidemia.  In the hospital, her total cholesterol was 231,      triglycerides 125, HDL 51, LDL 155.  We will continue on      therapeutic lifestyle modifications.  We will eventually follow up      lipids to reassess her lipid control and decide whether or not she      needs therapy.  4. Tobacco abuse.  She is trying to quit.  5. Polysubstance abuse.  She has not used any further cocaine.   DISPOSITION:  The patient will be set up for followup in the next 3  months  or sooner p.r.n.  We have recommended that she obtain a primary  care physician.  We will try to refer her to [facility].  If  she has obtained a primary care physician by the time she follows up  with [place], we can likely set her up for p.r.n. followup after that.      Richardson Dopp, PA-C  Electronically Signed      Marijo Conception. Verl Blalock, MD, [facility]  Electronically Signed   SW/MedQ  DD: 08/17/2008  DT: 08/18/2008  Job #: [id number]

## 2010-11-26 ENCOUNTER — Other Ambulatory Visit: Payer: Self-pay | Admitting: Cardiology

## 2011-01-04 ENCOUNTER — Emergency Department (HOSPITAL_COMMUNITY)
Admission: EM | Admit: 2011-01-04 | Discharge: 2011-01-04 | Disposition: A | Discharge status: Home / Self Care | Payer: Self-pay | Attending: Emergency Medicine | Admitting: Emergency Medicine

## 2011-01-04 DIAGNOSIS — I10 Essential (primary) hypertension: Secondary | ICD-10-CM | POA: Insufficient documentation

## 2011-01-04 DIAGNOSIS — K029 Dental caries, unspecified: Secondary | ICD-10-CM | POA: Insufficient documentation

## 2011-01-04 DIAGNOSIS — K047 Periapical abscess without sinus: Secondary | ICD-10-CM | POA: Insufficient documentation

## 2011-01-10 ENCOUNTER — Ambulatory Visit (INDEPENDENT_AMBULATORY_CARE_PROVIDER_SITE_OTHER): Payer: Self-pay | Admitting: Family Medicine

## 2011-01-10 ENCOUNTER — Encounter: Payer: Self-pay | Admitting: Family Medicine

## 2011-01-10 VITALS — BP 116/77 | HR 80 | Temp 98.0°F | Ht 63.0 in | Wt 167.0 lb

## 2011-01-10 DIAGNOSIS — M545 Low back pain, unspecified: Secondary | ICD-10-CM

## 2011-01-10 DIAGNOSIS — I10 Essential (primary) hypertension: Secondary | ICD-10-CM | POA: Insufficient documentation

## 2011-01-10 DIAGNOSIS — B1920 Unspecified viral hepatitis C without hepatic coma: Secondary | ICD-10-CM | POA: Insufficient documentation

## 2011-01-10 DIAGNOSIS — E785 Hyperlipidemia, unspecified: Secondary | ICD-10-CM | POA: Insufficient documentation

## 2011-01-10 DIAGNOSIS — K047 Periapical abscess without sinus: Secondary | ICD-10-CM

## 2011-01-10 DIAGNOSIS — M549 Dorsalgia, unspecified: Secondary | ICD-10-CM | POA: Insufficient documentation

## 2011-01-10 LAB — LIPID PANEL
Cholesterol: 250 mg/dL — ABNORMAL HIGH (ref 0–200)
HDL: 45 mg/dL (ref >39)
LDL Cholesterol: 164 mg/dL — ABNORMAL HIGH (ref 0–99)
Triglycerides: 204 mg/dL — ABNORMAL HIGH (ref <150)
VLDL: 41 mg/dL — ABNORMAL HIGH (ref 0–40)

## 2011-01-10 LAB — COMPREHENSIVE METABOLIC PANEL
Alkaline Phosphatase: 56 U/L (ref 39–117)
BUN: 16 mg/dL (ref 6–23)
Creat: 1.24 mg/dL — ABNORMAL HIGH (ref 0.50–1.10)
Glucose, Bld: 80 mg/dL (ref 70–99)
Total Bilirubin: 0.3 mg/dL (ref 0.3–1.2)

## 2011-01-10 MED ORDER — HYDROCODONE-ACETAMINOPHEN 5-500 MG PO TABS: 1.0000 | ORAL_TABLET | Freq: Four times a day (QID) | ORAL | Status: DC | PRN

## 2011-01-10 MED ORDER — CYCLOBENZAPRINE HCL 10 MG PO TABS: 10.0000 mg | ORAL_TABLET | Freq: Three times a day (TID) | ORAL | Status: DC | PRN

## 2011-01-10 NOTE — Progress Notes (Signed)
  Subjective:    Patient ID: Ashley [name], female    DOB: [birth year], 60 y.o.   MRN: [id number]  HPI New pr here to establish care and discuss the following: 1. Back pain: x6 mos. Immediate pain following attempt to pull sister up from floor following an epileptic seizure. Sister is 167 lbs. She has some pain free days but experiences low and mid back pain since the incident. She has take tylenol and used heating pad with minimal symptom relief. She has not been evaluated for this pain in the past. She denies radicular symptoms, loss of bowel or bladder function.   2. Dental bscess: pt evaluated in ED last Sunday for R upper tooth pain. Diagnosed with abscess. Started on PCN and given Vicodin prn pain. She is now out of Vicodin. She was instructed to see a doctor for removal of the tooth. She has had multiple teeth removed at the free clinic in [place] last spring. She denies fever. She is still able to eat and drink w/o difficulty.  3. HTN: taking norvasc and lisinopril. No CP, SOB. LE edema. On medication x one year. 4. HLD: started on meds last December. Unsure how high cholesterol or LDL was.  5.  Preventative care: last mammogram unknown. Last colonoscopy   Review of Systems As per HPI    Objective:   Physical Exam  Nursing note and vitals reviewed. Constitutional: She appears well-developed and well-nourished.  HENT:  Head: Normocephalic and atraumatic.  Mouth/Throat: Oropharynx is clear and moist. Abnormal dentition. Dental abscesses and dental caries present. No oropharyngeal exudate, posterior oropharyngeal edema or tonsillar abscesses.         Poor dentition with multiple teeth missing.   Cardiovascular: Normal rate, regular rhythm and normal heart sounds.   Pulmonary/Chest: Effort normal and breath sounds normal.  Musculoskeletal:       Thoracic back: She exhibits tenderness and laceration. She exhibits normal range of motion, no swelling, no edema, no deformity, no pain, no  spasm and normal pulse.       Lumbar back: She exhibits decreased range of motion, tenderness and pain. She exhibits no bony tenderness, no swelling, no edema, no deformity, no laceration, no spasm and normal pulse.       Arms:      Negative straight leg raising test seated and lying.   Neurological: No cranial nerve deficit. Coordination normal.          Assessment & Plan:

## 2011-01-10 NOTE — Patient Instructions (Signed)
Ms. Urness,  Thank you for coming in. Please get your x-rays at [facility].  I will call you with the results and we will come up with a plan once they are done.  For your tooth please apply for [facility] and we can set up the referral.   -Dr. Adrian Blackwater

## 2011-01-14 ENCOUNTER — Emergency Department (HOSPITAL_COMMUNITY)
Admission: EM | Admit: 2011-01-14 | Discharge: 2011-01-14 | Disposition: A | Discharge status: Home / Self Care | Payer: Self-pay | Attending: Emergency Medicine | Admitting: Emergency Medicine

## 2011-01-14 DIAGNOSIS — K089 Disorder of teeth and supporting structures, unspecified: Secondary | ICD-10-CM | POA: Insufficient documentation

## 2011-01-14 DIAGNOSIS — K047 Periapical abscess without sinus: Secondary | ICD-10-CM | POA: Insufficient documentation

## 2011-01-14 DIAGNOSIS — I10 Essential (primary) hypertension: Secondary | ICD-10-CM | POA: Insufficient documentation

## 2011-01-14 NOTE — Assessment & Plan Note (Addendum)
Obtained FLP. Pt with elevated t chol and LDL. Goal for this patient would be LDL < 130.  Continue pravachol.

## 2011-01-14 NOTE — Assessment & Plan Note (Signed)
BP well controlled. Continue current regimen. Check labs (Cr).

## 2011-01-14 NOTE — Assessment & Plan Note (Signed)
Continue PCN. Refill Vicodin. Pt to get orange card/Ashley [name] so that she can visit dentist.

## 2011-01-14 NOTE — Assessment & Plan Note (Signed)
Suspect sprain. Concerning that pain has been present for 6 mos.  Will send pt for x-ray. Flexeril and Vicodin for dental abscess and back pain. Will review x-rays and base further plan off of imaging.

## 2011-01-17 ENCOUNTER — Ambulatory Visit (HOSPITAL_COMMUNITY)
Admission: RE | Admit: 2011-01-17 | Discharge: 2011-01-17 | Disposition: A | Discharge status: Home / Self Care | Payer: Self-pay | Source: Ambulatory Visit | Attending: Family Medicine | Admitting: Family Medicine

## 2011-01-17 DIAGNOSIS — M51379 Other intervertebral disc degeneration, lumbosacral region without mention of lumbar back pain or lower extremity pain: Secondary | ICD-10-CM | POA: Insufficient documentation

## 2011-01-17 DIAGNOSIS — M545 Low back pain, unspecified: Secondary | ICD-10-CM

## 2011-01-17 DIAGNOSIS — M5137 Other intervertebral disc degeneration, lumbosacral region: Secondary | ICD-10-CM | POA: Insufficient documentation

## 2011-02-10 ENCOUNTER — Ambulatory Visit (INDEPENDENT_AMBULATORY_CARE_PROVIDER_SITE_OTHER): Payer: Self-pay

## 2011-02-10 ENCOUNTER — Inpatient Hospital Stay (INDEPENDENT_AMBULATORY_CARE_PROVIDER_SITE_OTHER)
Admission: RE | Admit: 2011-02-10 | Discharge: 2011-02-10 | Disposition: A | Discharge status: Home / Self Care | Payer: Self-pay | Source: Ambulatory Visit | Attending: Family Medicine | Admitting: Family Medicine

## 2011-02-10 DIAGNOSIS — M25519 Pain in unspecified shoulder: Secondary | ICD-10-CM

## 2011-02-13 ENCOUNTER — Ambulatory Visit: Payer: Self-pay

## 2011-02-16 ENCOUNTER — Emergency Department (HOSPITAL_COMMUNITY): Payer: Self-pay

## 2011-02-16 ENCOUNTER — Inpatient Hospital Stay (HOSPITAL_COMMUNITY)
Admission: EM | Admit: 2011-02-16 | Discharge: 2011-02-19 | DRG: 552 | Disposition: A | Discharge status: Home / Self Care | Payer: Self-pay | Attending: Family Medicine | Admitting: Family Medicine

## 2011-02-16 DIAGNOSIS — M47812 Spondylosis without myelopathy or radiculopathy, cervical region: Secondary | ICD-10-CM | POA: Diagnosis present

## 2011-02-16 DIAGNOSIS — M5030 Other cervical disc degeneration, unspecified cervical region: Principal | ICD-10-CM | POA: Diagnosis present

## 2011-02-16 DIAGNOSIS — Z79899 Other long term (current) drug therapy: Secondary | ICD-10-CM

## 2011-02-16 DIAGNOSIS — K259 Gastric ulcer, unspecified as acute or chronic, without hemorrhage or perforation: Secondary | ICD-10-CM | POA: Diagnosis present

## 2011-02-16 DIAGNOSIS — F17200 Nicotine dependence, unspecified, uncomplicated: Secondary | ICD-10-CM | POA: Diagnosis present

## 2011-02-16 DIAGNOSIS — E876 Hypokalemia: Secondary | ICD-10-CM | POA: Diagnosis present

## 2011-02-16 DIAGNOSIS — IMO0002 Reserved for concepts with insufficient information to code with codable children: Secondary | ICD-10-CM

## 2011-02-16 DIAGNOSIS — N179 Acute kidney failure, unspecified: Secondary | ICD-10-CM | POA: Diagnosis present

## 2011-02-16 DIAGNOSIS — E785 Hyperlipidemia, unspecified: Secondary | ICD-10-CM | POA: Diagnosis present

## 2011-02-16 DIAGNOSIS — I10 Essential (primary) hypertension: Secondary | ICD-10-CM | POA: Diagnosis present

## 2011-02-16 LAB — CBC
MCHC: 36 g/dL (ref 30.0–36.0)
MCV: 86.5 fL (ref 78.0–100.0)
Platelets: 349 10*3/uL (ref 150–400)
RDW: 13.7 % (ref 11.5–15.5)
WBC: 11.3 10*3/uL — ABNORMAL HIGH (ref 4.0–10.5)

## 2011-02-16 LAB — DIFFERENTIAL
Basophils Absolute: 0 10*3/uL (ref 0.0–0.1)
Eosinophils Absolute: 0.2 10*3/uL (ref 0.0–0.7)
Eosinophils Relative: 2 % (ref 0–5)
Lymphs Abs: 4.3 10*3/uL — ABNORMAL HIGH (ref 0.7–4.0)
Monocytes Absolute: 1.2 10*3/uL — ABNORMAL HIGH (ref 0.1–1.0)

## 2011-02-16 LAB — BASIC METABOLIC PANEL
BUN: 19 mg/dL (ref 6–23)
CO2: 23 mEq/L (ref 19–32)
Calcium: 10.2 mg/dL (ref 8.4–10.5)
Chloride: 96 mEq/L (ref 96–112)
Creatinine, Ser: 1.58 mg/dL — ABNORMAL HIGH (ref 0.50–1.10)

## 2011-02-16 LAB — POCT I-STAT TROPONIN I

## 2011-02-17 ENCOUNTER — Inpatient Hospital Stay (HOSPITAL_COMMUNITY): Payer: Self-pay

## 2011-02-17 ENCOUNTER — Encounter: Payer: Self-pay | Admitting: Family Medicine

## 2011-02-17 DIAGNOSIS — N179 Acute kidney failure, unspecified: Secondary | ICD-10-CM

## 2011-02-17 DIAGNOSIS — M5412 Radiculopathy, cervical region: Secondary | ICD-10-CM

## 2011-02-17 DIAGNOSIS — F17200 Nicotine dependence, unspecified, uncomplicated: Secondary | ICD-10-CM

## 2011-02-17 DIAGNOSIS — I10 Essential (primary) hypertension: Secondary | ICD-10-CM

## 2011-02-17 LAB — COMPREHENSIVE METABOLIC PANEL
ALT: 25 U/L (ref 0–35)
Alkaline Phosphatase: 58 U/L (ref 39–117)
BUN: 16 mg/dL (ref 6–23)
CO2: 25 mEq/L (ref 19–32)
Chloride: 104 mEq/L (ref 96–112)
GFR calc Af Amer: 60 mL/min — ABNORMAL LOW (ref >60)
GFR calc non Af Amer: 49 mL/min — ABNORMAL LOW (ref >60)
Glucose, Bld: 102 mg/dL — ABNORMAL HIGH (ref 70–99)
Potassium: 3.6 mEq/L (ref 3.5–5.1)
Sodium: 138 mEq/L (ref 135–145)
Total Bilirubin: 0.3 mg/dL (ref 0.3–1.2)
Total Protein: 6.8 g/dL (ref 6.0–8.3)

## 2011-02-17 LAB — CK TOTAL AND CKMB (NOT AT ARMC): Total CK: 94 U/L (ref 7–177)

## 2011-02-17 LAB — LIPASE, BLOOD: Lipase: 36 U/L (ref 11–59)

## 2011-02-17 LAB — HEMOGLOBIN A1C: Mean Plasma Glucose: 126 mg/dL — ABNORMAL HIGH (ref <117)

## 2011-02-17 LAB — CBC
HCT: 37.9 % (ref 36.0–46.0)
Hemoglobin: 13.1 g/dL (ref 12.0–15.0)
MCHC: 34.6 g/dL (ref 30.0–36.0)
RBC: 4.35 MIL/uL (ref 3.87–5.11)
WBC: 9.7 10*3/uL (ref 4.0–10.5)

## 2011-02-17 LAB — CARDIAC PANEL(CRET KIN+CKTOT+MB+TROPI)
Total CK: 85 U/L (ref 7–177)
Troponin I: <0.3 ng/mL (ref <0.30)

## 2011-02-17 LAB — TROPONIN I: Troponin I: <0.3 ng/mL (ref <0.30)

## 2011-02-17 LAB — MRSA PCR SCREENING: MRSA by PCR: NEGATIVE

## 2011-02-17 MED ORDER — IOHEXOL 300 MG/ML  SOLN
100.0000 mL | Freq: Once | INTRAMUSCULAR | Status: AC | PRN
  Administered 2011-02-17: 100 mL via INTRAVENOUS

## 2011-02-17 NOTE — H&P (Signed)
[facility] Admission History and Physical  Patient name: Ashley [name] Medical record number: [id number] Date of birth: [birth year] Age: 60 y.o. Gender: female  Primary Care Provider: Boykin Nearing, MD  Chief Complaint: chest and Right shoulder pain History of Present Illness: Ashley [name] is a 60 y.o. year old female with PMH significant for HTN, HLD, Hepatitis C who is presenting with about a 2 week course of increasing Right shoulder pain and several day course of chest pain.  Regarding her shoulder pain, she describes pain alternating between aching and sharp shooting pains that radiate down her Right arm to her wrist.  She has been seen at Urgent Care for this on 02/10/11 and was diagnosed with generalized shoulder pain and treated with Vicodin and Flexeril.  Patient states this has not improved her pain.  Has not been able to sleep more than several hours at night due to pain.  Denies any weakness, only pain.  No numbness or paresthesias.  Regarding her chest pain, this started several days ago and has gradually been increasing in intensity as well.  Pain is described as aching, crushing pain in her chest that radiates through to her back.  She becomes diaphoretic and nauseous when she has this pain, and had several episodes of vomiting 2 nights ago but has had none since.  No hematemesis.  Pain medication does not seem to help pain in her chest and back.  Chest pain can occur at rest or during exertion, but has no consistent trigger.  No shortness of breath associated with chest pain.  Rest does not relieve her pain and she has never taken NTG.  No fevers, chills, abdominal pain, change in bowel habits, palpitations.    Patient Active Problem List  Diagnoses  . HYPERLIPIDEMIA  . TOBACCO ABUSE  . HYPERTENSION  . GASTRIC ULCER  . CONSTIPATION  . MUSCULOSKELETAL PAIN  . Hepatitis C  . Hyperlipidemia  . Hypertension  . Low back pain  . Dental abscess   Past  Medical History: Past Medical History  Diagnosis Date  . Ulcer 1990    gastric ulcer. Ruptured s/p emergency repair  . Arthritis   . Pancreatitis 2000    resolved  . 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 Hyperlipidemia   . Hypertension     Past Surgical History: Past Surgical History  Procedure Date  . Abdominal hysterectomy 1979    Social History:  Smokes 6-7 cigarettes per day.  Has smoked 1/2 - 1 ppd for past 40 years.  Denies any EtOH or illicit drug use.  History   Social History  . Marital Status: Widowed    Spouse Name: N/A    Number of Children: N/A  . Years of Education: N/A   Social History Main Topics  . Smoking status: Current Everyday Smoker -- 0.3 packs/day    Types: Cigarettes  . Smokeless tobacco: Never Used  . Alcohol Use: 0.0 oz/week    0 Cans of beer per week  . Drug Use: No  . Sexually Active: No   Other Topics Concern  . Not on file   Social History Narrative  . No narrative on file    Family History: Family History  Problem Relation Age of Onset  . Hypertension Father   . Cancer Father   . Hyperlipidemia Father   . Seizures Sister     Allergies: Allergies  Allergen Reactions  . Aspirin Other (See Comments)  Stomach ache  . Ibuprofen Other (See Comments)    Stomach ache    Current Outpatient Prescriptions  Medication Sig Dispense Refill  . amLODipine (NORVASC) 10 MG tablet take 1 tablet by mouth once daily  30 tablet  1  . cyclobenzaprine (FLEXERIL) 10 MG tablet Take 1 tablet (10 mg total) by mouth every 8 (eight) hours as needed for muscle spasms.  30 tablet  1  . HYDROcodone-acetaminophen (VICODIN) 5-500 MG per tablet Take 1 tablet by mouth every 6 (six) hours as needed for pain.  30 tablet  0  . lisinopril (PRINIVIL,ZESTRIL) 10 MG tablet TAKE ONE TABLET BY MOUTH EVERY DAY  30 tablet  3  . pravastatin (PRAVACHOL) 40 MG tablet Take 40 mg by mouth daily.        Review Of Systems: Per HPI with the following additions:  none Otherwise 12 point review of systems was performed and was unremarkable.  Physical Exam: Pulse: 114  Blood Pressure: 146/120 RR:     O2: 98 on RA Temp: 98.1  General: alert, cooperative, appears stated age and no distress HEENT: PERRLA, extra ocular movement intact, sclera clear, anicteric and neck supple with midline trachea.  Mucus membranes dry Heart: Tachycardic.  Regular rate.  No murmur.   Lungs: clear to auscultation, no wheezes or rales and unlabored breathing Abdomen: abdomen is soft without significant tenderness, masses, organomegaly or guarding Extremities: extremities normal, atraumatic, no cyanosis or edema MSK:  Right shoulder atraumatic in appearance. Patient with full active and passive ROM of Right shoulder, though does experience pain with movement.  Pulses intact distally.  Sensation normal Right arm and hand.  Positive Neer sign.  Positive empty can sign.  Right lumbar Paraspinal muscles tight and appear to be in spasm.  Palpable muscle cords present.  No spasm Left lumbar paraspinal muscles. Skin:no rashes, no ecchymoses Neurology: normal without focal findings, mental status, speech normal, alert and oriented x3, PERLA, cranial nerves 2-12 intact, muscle tone and strength normal and symmetric, reflexes normal and symmetric, sensation grossly normal and finger to nose and cerebellar exam normal  Labs and Imaging: Lab Results  Component Value Date/Time   NA 133* 02/16/2011  9:28 PM   K 3.4* 02/16/2011  9:28 PM   CL 96 02/16/2011  9:28 PM   CO2 23 02/16/2011  9:28 PM   BUN 19 02/16/2011  9:28 PM   CREATININE 1.58* 02/16/2011  9:28 PM   CREATININE 1.24* 01/10/2011  3:09 PM   GLUCOSE 130* 02/16/2011  9:28 PM   Lab Results  Component Value Date   WBC 11.3* 02/16/2011   HGB 15.9* 02/16/2011   HCT 44.2 02/16/2011   MCV 86.5 02/16/2011   PLT 349 02/16/2011  D-Dimer: 1.01  CXR:  No acute abnormality Right shoulder xray:  No acute abnormality  Assessment and Plan: Ashley [name] is a 60  y.o. year old female presenting with Right shoulder pain and chest pain, with concern for possible ACS though less likely: 1. Chest pain:  Present for past several days and increasing in intensity.  Atypical chest pain.  Plan to admit patient for ACS rule out, will obtain A1C and TSH.  FLP in 01/2011 revealed LDL of 160.  Her risk factors include her age, HTN, and smoking history.  Also concerning is the fact she experiences pain with deep inspiration, is tachycardic, and had an elevated D-dimer.  After speaking with radiology, patient's creatinine clearance is too high for CTA of chest.  Plan is  to rehydrate the patient overnight and recheck Cr in AM; she may qualify for CTA at that time versus possible V/Q scan.  Will start on full dose Lovenox regardless with eye towards stopping this if we can rule out PE.  Somewhat less likely due to no redness/edema of LE's.  Will also obtain EKG in AM to evaluate for any changes.  Unable to start patient on ASA due to allergy. 2.  Prior cardiac history:  Patient admitted in 2010 with similar symptoms.  Underwent cardiology referral at that time.   She underwent a stress Myoview study                which had suboptimal exercise with her heart rates   climbing to 78% of her maximal predicted heart rate.  Her images demonstrated normal perfusion.  An              echocardiogram demonstrated an EF of 60-65% with mild-to-moderate LVH at that time.  Unclear if she has continued to follow-up with cardiology.  If she rules out from cardiac standpoint today, likely hold on inpatient Cards consult and await outpatient.   2.  Right shoulder pain:  Positive Neer sign/positive supraspinatus testing.  Would favor rotator cuff tendinopathy as etiology of pain.  Does not seem to have any cervical spine or neuropathic pain.  Will attempt to treat with Morphine (along with helping chest pain) overnight with eye towards switching to PO medications in AM.  Would favor outpatient Sports Med  referral and workup.   3.  Back pain:  Appears to be spasm.  Will continue patient's Flexeril.  Other possible etiologies are pancreatitis, though she is not having abdominal pain, dissecting AAA, also less likely.  Will observe for improvement 4.  HTN:  SBP 140-170s in ED.  Narrow pulse pressure, which can be concerning for aortic stenosis.  No murmur auscultated, no others signs or symptoms of aortic stenosis.  Plan to continue patient's home Norvasc.  Will temporarily hold Lisinopril and start patient on beta blocker due to chest pain and somewhat elevated creatinine.   6.  Acute kidney injury:  Baseline creatinine appears to be between 0.9 and 1.  Patient very dry on exam, likely prerenal in nature.  She admits to decreased po intake of both solids and liquids due to arm pain and not feeling hungry/thirsty.  Hold on FeNa for now and attempt to rehydrate patient o/n, recheck Cr in AM.   7.  Question of hepatitis C:  This appears in her Epic problem list but cannot find past confirmation of this.  Consider Hepatitis testing inpatient versus outpatient and, if positive, referral to Hep C clinic. 8. HLD:  Diagnosed at recent outpatient visit.  Continue with statin. 9. Tobacco abuse:  Will obtain smoking cessation consult 10. FEN/GI: heart healthy diet 11. Prophylaxis: lovenox treatment dose 12. Disposition: pending further workup  Annabell Sabal MD [phone]

## 2011-02-18 LAB — BASIC METABOLIC PANEL
BUN: 11 mg/dL (ref 6–23)
Chloride: 109 mEq/L (ref 96–112)
Glucose, Bld: 128 mg/dL — ABNORMAL HIGH (ref 70–99)
Potassium: 3.6 mEq/L (ref 3.5–5.1)

## 2011-02-18 LAB — CBC
HCT: 37.6 % (ref 36.0–46.0)
Hemoglobin: 12.8 g/dL (ref 12.0–15.0)
WBC: 7.9 10*3/uL (ref 4.0–10.5)

## 2011-02-19 ENCOUNTER — Inpatient Hospital Stay (HOSPITAL_COMMUNITY): Payer: Self-pay

## 2011-02-24 NOTE — Discharge Summary (Signed)
Ashley Ashley [name], Ashley [name]                 ACCOUNT NO.:  [id number]  MEDICAL RECORD NO.:  [id number]  LOCATION:  [id number]                         FACILITY:  [facility]  PHYSICIAN:  Jamal Collin. Korry Dalgleish, M.D.DATE OF BIRTH:  Jul 04, 1950  DATE OF ADMISSION:  02/16/2011 DATE OF DISCHARGE:  02/19/2011                              DISCHARGE SUMMARY   PRIMARY CARE PHYSICIAN:  Boykin Nearing, MD at [facility] DIAGNOSIS:  Right-sided cervical radiculopathy.  SECONDARY DIAGNOSES: 1. Acute kidney injury - resolved. 2. Tobacco dependence. 3. Hyperlipidemia. 4. Hypertension. 5. Gastric ulcer disease.  Allergies: NSAIDS, ASA 2/2 previous history of gastric ulcer disease  HOSPITAL COURSE:  The patient presented to the ED on February 16, 2011, with right-sided chest pain of several days duration as well as right shoulder pain of 2 weeks duration.  Her chest pain was crushing radiating to back, was accompanied by nausea and vomiting.  Based on the risk factors of age, hyperlipidemia, hypertension, tobacco abuse, and was concerned for cardiac origin chest pain.  ADMISSION LABS:  D-dimer is 1.01.  Sodium 134, potassium 3.1, creatinine 1.58.  Her EKG was normal.  Her cardiac enzymes were normal.  Chest x- ray was normal and right shoulder x-ray was normal. For the differential diagnosis, we shifted to focus more on pulmonary embolism as a potential cause for this chest pain as well as concern for the acute kidney injury.  In order to rule out pulmonary embolism, a CT angiogram was ordered, and the patient was started on treatment dose of Lovenox.  For the acute kidney injury, the patient was started on IV fluids at 125 mL an hour and her home dose of lisinopril was held in favor of metoprolol. A CT angiogram of the chest showed no pulmonary embolism.  However, there were a few mildly prominent right hilar lymph nodes measuring up to 1.2 cm in short axis and noted to be nonspecific and  have increased in size since comparison examination in 2010.  The patient's repeat EKGs on the morning of February 17, 2011, also showed normal sinus rhythm and nonspecific T-wave abnormality.  At that time, however, the patient was still in significant pain, which she rated 10/10, which was being treated with morphine IV 2 mg q.2 h.. Therefore, we focused on a musculoskeletal origin of pain as apposed to cardiac and pulmonary.    We ordered a cervical x-ray as well as cervical spine MRI.  The cervical spine x-ray showed straightening of the usual lordosis severe diffuse degenerative disk disease, spondylosis, and facet degenerative changes; however, there has been no significant change since she had the same study in February 2011.  The MRIs of the cervical spine showed multilevel spondylosis contributing to mild central stenosis at C4 and C5 and moderate central stenosis at C5 and C6.  There was no cord deformity, abnormal cord signal.  There were also osteophytes and uncinate spurring and contributed to moderate biforaminal stenosis at C3-C4 and C4-C5, and several biforaminal stenosis at C5-C6.  There is an exiting nerve root encroachment at these levels as possible.  The thoracic spine MRI was read as small posterolateral disk protrusion on the left  at T10-T11.  No right-sided disk protrusion, cord deformity, or nerve root encroachment demonstrated.  Therefore, we concluded that the source of her significant neck, shoulder, and right scapula pain was due to cervical radiculopathy, and she was started on a steroid burst as well as gabapentin for the pain.  At the time of discharge, pain in the right shoulder and the scapula.  Chest, diminished a significantly to a 2/10 with improved range of motion of the right shoulder.  The patient denies shortness of breath.  LABORATORY DATA AT DISCHARGE:  BMP was within normal limits.  CBC was also within normal limits.  DISCHARGE  MEDICATIONS: 1. Cyclobenzaprine 10 mg every 8 hours as needed. 2. Gabapentin 300 mg by mouth daily. 3. Prednisone 60 mg daily x3 days for a total of 5 days steroid burst. 4. Omeprazole 20 mg p.o. daily. 5. Pravastatin 20 mg p.o. daily. 6. Amlodipine 10 mg p.o. daily. 7. Lisinopril 10 mg p.o. daily.  The patient will follow up with her primary care physician, Dr. Boykin Nearing at [facility] on February 26, 2011, at 10:00 a.m.  She will follow up with Sports Medicine on February 27, 2011 at 2:30 p.m.  FOLLOWUP ISSUES:  Pain control for cervical radiculopathy as well as continued treatment for hypertension, hyperlipidemia, and tobacco dependence.    ______________________________ Dewain Penning, MD   ______________________________ Jamal Collin. Andria Frames, M.D.    EW/MEDQ  D:  02/20/2011  T:  02/20/2011  Job:  [id number]  Electronically Signed by Dewain Penning  on 02/22/2011 09:57:33 AM Electronically Signed by Madison Hickman M.D. on 02/24/2011 03:33:36 PM

## 2011-02-26 ENCOUNTER — Encounter: Payer: Self-pay | Admitting: Family Medicine

## 2011-02-26 ENCOUNTER — Ambulatory Visit (INDEPENDENT_AMBULATORY_CARE_PROVIDER_SITE_OTHER): Payer: Self-pay | Admitting: Family Medicine

## 2011-02-26 DIAGNOSIS — M67919 Unspecified disorder of synovium and tendon, unspecified shoulder: Secondary | ICD-10-CM

## 2011-02-26 DIAGNOSIS — M75101 Unspecified rotator cuff tear or rupture of right shoulder, not specified as traumatic: Secondary | ICD-10-CM

## 2011-02-26 MED ORDER — OXYCODONE-ACETAMINOPHEN 5-325 MG PO TABS: 1.0000 | ORAL_TABLET | Freq: Four times a day (QID) | ORAL | Status: DC | PRN

## 2011-02-26 MED ORDER — GABAPENTIN 100 MG PO CAPS: 100.0000 mg | ORAL_CAPSULE | Freq: Three times a day (TID) | ORAL | Status: DC

## 2011-02-26 NOTE — Patient Instructions (Signed)
Ashley [name],  Thank you for coming in today. Please increase your gabapentin to 100 mg three times daily. Take your percocet when your pain is severe. I will be in touch regarding your physical therapy.   -Dr. Adrian Blackwater

## 2011-02-27 ENCOUNTER — Encounter: Payer: Self-pay | Admitting: Family Medicine

## 2011-02-27 ENCOUNTER — Ambulatory Visit (INDEPENDENT_AMBULATORY_CARE_PROVIDER_SITE_OTHER): Payer: Self-pay | Admitting: Family Medicine

## 2011-02-27 VITALS — BP 107/71 | HR 86 | Ht 63.0 in | Wt 166.0 lb

## 2011-02-27 DIAGNOSIS — M5412 Radiculopathy, cervical region: Secondary | ICD-10-CM

## 2011-02-27 DIAGNOSIS — M67919 Unspecified disorder of synovium and tendon, unspecified shoulder: Secondary | ICD-10-CM

## 2011-02-27 DIAGNOSIS — M719 Bursopathy, unspecified: Secondary | ICD-10-CM

## 2011-02-27 DIAGNOSIS — M75101 Unspecified rotator cuff tear or rupture of right shoulder, not specified as traumatic: Secondary | ICD-10-CM

## 2011-02-27 MED ORDER — PREDNISONE 10 MG PO TABS: ORAL_TABLET | ORAL | Status: AC

## 2011-02-27 NOTE — Patient Instructions (Signed)
Gabapentin Taper:  100 mg tablet three times a day for 1 week  Then increase to 200 mg at bedtime for 1 week (and 100 mg in the morning and around lunch)  Then increase to 200 mg in the morning and before bed for 1 week (and 100 mg in the morning and around lunch)  Then increase to 200 mg three times a day  F/u in 4 weeks

## 2011-02-28 ENCOUNTER — Encounter: Payer: Self-pay | Admitting: Family Medicine

## 2011-02-28 DIAGNOSIS — M75101 Unspecified rotator cuff tear or rupture of right shoulder, not specified as traumatic: Secondary | ICD-10-CM | POA: Insufficient documentation

## 2011-02-28 DIAGNOSIS — M5412 Radiculopathy, cervical region: Secondary | ICD-10-CM

## 2011-02-28 HISTORY — DX: Radiculopathy, cervical region: M54.12

## 2011-02-28 NOTE — Progress Notes (Signed)
Subjective:    Patient ID: Ashley [name], female    DOB: 1951/01/17, 60 y.o.   MRN: [id number]  HPI  Very pleasant patient who has been having some numbness and tingling in her shoulder blade and down through her arm away to her hand for approximately the last 3 weeks on the RIGHT. No trauma or injury. She has had a similar sensation in the past, but that was on the LEFT side. She does also complain some pain with abduction and some mild pain with internal range of motion. Some pain in the lateral shoulder as well. No history of fracture, dislocation, subluxation, or operative intervention in the neck or shoulder. She is currently not working.  She has been started on some gabapentin, and recently titrated up on this to 100 mg p.o. T.i.d.  MRI of the cervical spine is independently reviewed. 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 C5-6 does appear to be the worst level.  The PMH, PSH, Social History, Family History, Medications, and allergies have been reviewed in [facility], and have been updated if relevant.   Review of Systems  REVIEW OF SYSTEMS  GEN: No fevers, chills. Nontoxic. Primarily MSK c/o today. MSK: Detailed in the HPI GI: tolerating PO intake without difficulty Neuro: above Otherwise the pertinent positives of the ROS are noted above.      Objective:   Physical Exam   Physical Exam  Blood pressure 107/71, pulse 86, height 5\' 3"  (1.6 m), weight 166 lb (75.297 kg).  GEN: Well-developed,well-nourished,in no acute distress; alert,appropriate and cooperative throughout examination HEENT: Normocephalic and atraumatic without obvious abnormalities. Ears, externally no deformities PULM: Breathing comfortably in no respiratory distress EXT: No clubbing, cyanosis, or edema PSYCH: Normally interactive. Cooperative during the interview. Pleasant. Friendly and conversant. Not anxious  or depressed appearing. Normal, full affect.  R shoulder Inspection: No muscle wasting or winging Ecchymosis/edema: neg  AC joint, scapula, clavicle: NT Abduction: full, 5/5 - painful Flexion: full, 5/5 IR, full, lift-off: 5/5 ER at neutral: full, 5/5 AC crossover and compression: neg Neer: pos Hawkins: pos Drop Test: neg Empty Can: neg Supraspinatus insertion: pos Bicipital groove: NT Speed's: neg Yergason's: neg Sulcus sign: neg Scapular dyskinesis: none C5-T1 intact Sensation intact Grip 5/5  Cervical spine: There is a moderate degree of restriction with flexion extension and lateral bending, and more so with rotation. Patient is tender to palpation most in the paracervical region just distal to the occiput and around the muscular attachment. Trapezius is relatively calm. C5-T1 is intact for motor function and sensory function. Spurling's test is positive with symptoms on the RIGHT. Deep tendon reflexes are 2+ upper extremity.      Assessment & Plan:   1. Cervical radiculopathy  predniSONE (DELTASONE) 10 MG tablet  2. Rotator cuff syndrome of right shoulder      Primary problem seems to be coming from the patient's neck, probable nerve impingement which clinically goes along by exam and correlates with her MRI findings. They the patient's primary care provider as started things off well. I would continue to titrate up her gabapentin dose, and gave her instructions to do so. Other options include try cyclic antidepressants and Lyrica. It is reasonable for the patient to have some Percocet, Vicodin, or something similar like she does currently.  She is also having some relatively significant cough impingement on the RIGHT probably has some subacromial bursitis as well. Will try doing  a 10 day prednisone burst and taper to see if she has improvement from either of these symptoms with this.  We reviewed some cervical range of motion and some rotator cuff and scapular  stabilization exercises with the patient. She will benefit from physical therapy, and is going to call us once her [place] is set up.

## 2011-03-03 NOTE — Assessment & Plan Note (Signed)
A: R shoulder pain 2/2 to cervical radiculopathy. P: increase gabapentin as tolerated. Percocet prn. PT.

## 2011-03-03 NOTE — Progress Notes (Signed)
  Subjective:    Patient ID: Ashley [name], female    DOB: 02-09-1951, 60 y.o.   MRN: [id number]  HPI Here for HFU following admission for R shoulder pain. She was admitted to rule out ACS since she has a cardiac history. She was found to have cervical radiculopathy. She reports persistent pain. She feels that the gabapentin is causing her to feel shaky. She denies fainting or falling. She is compliant with her medication. She has her first PT visit scheduled for tomorrow.   Review of Systems As per HPI    Objective:   Physical Exam Gen: alert, NAD CV: SIS2, RRR R upper Ext: full ROM. Pain with palpation of R c-spine and rotation. Grip full. Sensation intact.        Assessment & Plan:

## 2011-03-03 NOTE — H&P (Signed)
Ashley [name], Ashley [name]                 ACCOUNT NO.:  [id number]  MEDICAL RECORD NO.:  [id number]  LOCATION:  [facility]                         FACILITY:  [facility]  PHYSICIAN:  Jamal Collin. Hensel, M.D.DATE OF BIRTH:  May 14, 1951  DATE OF ADMISSION:  02/16/2011 DATE OF DISCHARGE:                             HISTORY & PHYSICAL   PRIMARY CARE PROVIDER:  Boykin Nearing, MD, at [facility].  CHIEF COMPLAINT:  Chest pain and right shoulder pain.  HISTORY OF PRESENT ILLNESS:  [name] is a 60 year old female with past medical history significant for hypertension, hyperlipidemia, and hepatitis C who is presenting with about a 2-week course of increasing right shoulder pain and several-day course of chest pain.  Regarding her shoulder pain which describes pain alternating between aching and sharp shooting pains that radiates down her right arm to her wrist.  She has been seen at Urgent Care for this on February 10, 2011, and was diagnosed with generalized shoulder pain and treated Vicodin and Flexeril.  The patient states this has not improved her pain.  She has not been able to sleep more than several hours at night due to her pain. Denies any weakness, only complains of pain.  No numbness or paresthesias.  Regarding her chest pain, it started several days ago and has been gradually increasing intensity as well.  Pain is described as aching, crushing pain in her chest that radiates through to her back. She becomes diaphoretic and nauseous when she has this pain and had several episodes of vomiting 2 nights ago, but has had none since.  No hematemesis.  Pain medications do not seem to help pain in her chest and back.  Chest pain can occur at rest or during exertion, but has no persistent trigger.  No shortness of breath associated with chest pain. She does have some pain on deep inspiration.  Rest does not relieve her pain and she has never taken nitroglycerin.  No fevers,  chills, abdominal pain, change of bowel habits, or palpitations.  PAST MEDICAL HISTORY: 1. Hyperlipidemia. 2. Tobacco abuse. 3. Hypertension. 4. Gastric ulcer, status post emergency repair in 1990. 5. Question of hepatitis C. 6. History of pancreatitis.  PAST SURGICAL HISTORY:  Abdominal hysterectomy in 1979.  FAMILY HISTORY:  Denies.  SOCIAL HISTORY:  The patient smokes 6-7 cigarettes per day.  She smoked 1/2-1 pack per day for the past 40 years.  She denies any ethanol or illicit drug use.  She does live at home by herself.  ALLERGIES: 1. ASPIRIN. 2. IBUPROFEN.  MEDICATIONS: 1. Amlodipine 10 mg p.o. daily. 2. Flexeril 10 mg p.o. q.8 h. p.r.n. muscle spasm. 3. Vicodin 5/500 one tablet p.o. q.6 h. p.r.n. pain. 4. Lisinopril 10 mg p.o. daily. 5. Pravachol 40 mg p.o. daily.  REVIEW OF SYSTEMS:  A 12-point review of systems completed and negative except for HPI.  PHYSICAL EXAMINATION:  VITAL SIGNS:  Pulse 114, blood pressure 146/120, respiratory rate 18, O2 sats 98% on room air, and temperature is 98.1. GENERAL:  Alert and cooperative patient, appears her stated age, in no distress. HEENT:  Pupils are equal and reactive to light.  Extraocular movements are intact.  Sclerae are clear, anicteric. NECK:  Supple with midline trachea.  Mucous membranes dry. HEART:  Tachycardic with regular rate.  No murmur. LUNGS:  Clear to auscultation without wheezes or rales and no labored breathing. ABDOMEN:  Soft without significant tenderness, masses, or guarding. EXTREMITIES:  Normal and atraumatic without cyanosis or edema.  No erythema. MUSCULOSKELETAL:  Right shoulder atraumatic in appearance.  The patient has full active and passive range of motion of the right shoulder. There, she does experience pain with movement.  Pulses intact distally. Sensation is normal in right arm and hands.  Positive Neer sign. Positive empty can sign.  Right lumbar paraspinal muscles is tight  and appear to be in spasm.  Palpable muscle cords present.  No spasm in the left lumbar paraspinal muscles. SKIN:  No rashes.  No ecchymosis. NEUROLOGY:  Alert and oriented x3.  Mental status and speech normal. Muscle tone and strength normal and symmetric in bilateral upper and lower extremities.  Reflexes normal and symmetric.  Sensation grossly normal throughout.  Finger-nose normal and cerebellar exam normal.  LABORATORY DATA:  Sodium 132, potassium 3.4, chloride 96, CO2 of 23, BUN 19, creatinine 1.58, and glucose 130.  CBC showed white blood cells 11.3, hematocrit 44.2, hemoglobin 51.9, and platelets 349.  D-dimers 1.01.  IMAGING: 1. Chest x-ray showed no acute abnormality. 2. Right shoulder x-ray showed no acute abnormality.  ASSESSMENT/PLAN:  [name] is a 60 year old female presenting with right shoulder pain and chest pain that is concerning for possibly acute coronary syndrome, but this is less likely. 1. Chest pain present over the past several days and increasing in     intensity.  Atypical chest pain.  Plan to admit the patient for ACS     rule out.  We will obtain A1c and TSH.  Fasting lipid panel in     August 2012 revealed LDL of 160.  Her risk factors include age,     hypertension, hyperlipidemia, and smoking history.  Also concern is     that she experienced pain with deep inspiration, is tachycardic,     and had elevated D-dimer.  After speaking with Radiology, as the     patient's creatinine clearance is 2, could not order CTA of her chest.     Plan to rehydrate the patient overnight and recheck creatinine in     the a.m.  She may have qualified for CTA at that time versus     possible V/Q scan.  Plan to start with full-dose Lovenox regardless     with an eye toward stopping this, so we can rule out PE.  Somewhat     less likely due to no redness or edema in the lower extremities.     Not tachypneic.  We will also obtain EKG in the morning to evaluate     for  any changes.  Unable to start the patient on ASPIRIN due to     allergy. 2. Prior cardiac history.  The patient admitted at recently with similar     symptoms.  No cardiology referral at that time.  She underwent a     stress Myoview study which had suboptimal exercise with heart rate     parameter at 78% with maximum reduced heart rate.  Her images     demonstrated normal perfusion.  Echocardiogram at that time showed     EF of 60-65% with mild to moderate LVH.  Unclear if she has     continued to  have followup with Cardiology.  If she rules out from     a cardiac standpoint today, likely hold on the patient's cardiology     consult and await outpatient followup. 3. Right shoulder pain.  Positive Neer sign positive and positive     supraspinatus testing.  We will favor rotator cuff tendinopathy as     the etiology of her pain.  Does not seem to have any cervical spine     or neuropathic pain.  We will attempt to treat with morphine (which     can help her chest pain as well) overnight with an eye towards to     p.o. medications in the a.m.  We will favor outpatient physical     therapy referral and workup. 4. Back pain with increased spasm.  We will continue the patient's     Flexeril.  Other possible etiologies are pancreatitis, although she     is not having abdominal pain.  Dissecting abdominal aortic aneurysm     also possibility though also less likely.  We will observe for     improvement. 5. Hypertension.  Systolic blood pressures [id number] in the ED.  She     has an elevated blood pressure which can be concerning for aortic     stenosis, however, no murmurs auscultated.  She had no signs of     aortic stenosis on echocardiogram in 2010.  She has no other signs     or symptoms of aortic stenosis.  Plan to continue the patient's     home Norvasc.  We will temporarily hold her lisinopril and start     the patient on beta-blocker due to her chest pain and somewhat     elevated  creatinine. 6. Acute kidney injury.  Baseline creatinine appears to be between 0.9-     1.  The patient's renal exam likely prerenal in nature.  She admits     to decreased p.o. intake with solids and liquids due to arm pain     and just generally feeling hungry and thirsty over the past couple     of days.  We will hold on FENa for now, attempt to rehydrate the     patient overnight and recheck her creatinine in the morning. 7. Question of hepatitis C.  This appears in her EPIC problem list,     but I cannot find any past confirmation of this.  Consider     hepatitis testing inpatient verus outpatient and if positive     referral to [facility]. 8. Hyperlipidemia, diagnosed at recent outpatient visit.  Plan to     continue statin. 9. Tobacco abuse.  We will attempt smoking cessation consult. 10.Fluids, electrolytes, nutrition and gastrointestinal.  Heart     healthy diet. 11.Prophylaxis.  Full-dose Lovenox. 12.Disposition.  Pending further workup.     Annabell Sabal, MD   ______________________________ Jamal Collin Andria Frames, M.D.    JW/MEDQ  D:  02/17/2011  T:  02/17/2011  Job:  [id number]  Electronically Signed by Annabell Sabal  on 02/26/2011 03:34:01 PM Electronically Signed by Madison Hickman M.D. on 03/03/2011 09:05:08 AM

## 2011-03-06 ENCOUNTER — Telehealth: Payer: Self-pay | Admitting: Family Medicine

## 2011-03-06 NOTE — Telephone Encounter (Signed)
Ashley [name] is calling because she thought that Dr. Adrian Blackwater was prescribing Flexeril and Percocet.  When she went to the Pharmacy to pick them up, the Percocet was not there.  She said the Flexeril is not working on its own and she wants to know if she can get the Flexeril.

## 2011-03-06 NOTE — Telephone Encounter (Signed)
Pt states that she has taken all of the percocet she was given on the 19th.  Would like a refill on these.  Will forward to Dr. Candace Cruise park as she is covering Dr. Adrian Blackwater box.  Will also route to Dr Adrian Blackwater.  Pt informed [place], Salome Spotted

## 2011-03-07 LAB — WET PREP, GENITAL
Trich, Wet Prep: NONE SEEN
Yeast Wet Prep HPF POC: NONE SEEN

## 2011-03-07 LAB — URINALYSIS, ROUTINE W REFLEX MICROSCOPIC
Ketones, ur: 15 — AB
Leukocytes, UA: NEGATIVE
Nitrite: NEGATIVE
Protein, ur: 100 — AB

## 2011-03-07 LAB — URINE MICROSCOPIC-ADD ON

## 2011-03-07 MED ORDER — OXYCODONE-ACETAMINOPHEN 5-325 MG PO TABS: 1.0000 | ORAL_TABLET | Freq: Three times a day (TID) | ORAL | Status: DC | PRN

## 2011-03-07 NOTE — Telephone Encounter (Signed)
Spoke with patient who has right rotator cuff tendonitis/nerve impingement. Seen by Dr. Adrian Blackwater 09/19 and @ Sports Med 09/20.  Next doctor's appointment ([facility]) is October 19th.  Taking 100mg  gabapentin tid and percocet tid (with gapapentin). Advised take 2 tablets gapapentin tid today then 3 tablets tid tomorrow if tolerating.  Will give Rx for percocet for 20 tablets for now. Hopefully increasing the gabapentin will decrease need for this.

## 2011-03-20 LAB — URINALYSIS, ROUTINE W REFLEX MICROSCOPIC
Bilirubin Urine: NEGATIVE
Nitrite: POSITIVE — AB
Specific Gravity, Urine: 1.025
pH: 5.5

## 2011-03-20 LAB — URINE MICROSCOPIC-ADD ON

## 2011-03-24 ENCOUNTER — Other Ambulatory Visit: Payer: Self-pay | Admitting: Cardiology

## 2011-03-27 ENCOUNTER — Ambulatory Visit: Payer: Self-pay | Admitting: Family Medicine

## 2011-06-05 ENCOUNTER — Other Ambulatory Visit: Payer: Self-pay | Admitting: Cardiology

## 2011-06-07 ENCOUNTER — Other Ambulatory Visit: Payer: Self-pay | Admitting: Cardiology

## 2011-06-09 ENCOUNTER — Telehealth: Payer: Self-pay | Admitting: Family Medicine

## 2011-06-09 NOTE — Telephone Encounter (Signed)
Dr. Adrian Blackwater - patient called asking for a refill of Lisinopril.  She says that a doctor in [place] was the last to refill it, but would like you to prescribe it from now on.  She has been out of the medication for several days.  I told patient that the [facility] Emergency Line does not send prescriptions over the phone, but I would send you a message.  Patient denies any HA, fatigue, chest pain, nausea/vomiting.  Advised her to go to Urgent Care if she develops symptoms.

## 2011-06-11 ENCOUNTER — Other Ambulatory Visit: Payer: Self-pay | Admitting: *Deleted

## 2011-06-11 MED ORDER — LISINOPRIL 10 MG PO TABS: 10.0000 mg | ORAL_TABLET | Freq: Every day | ORAL | Status: DC

## 2011-08-01 ENCOUNTER — Encounter: Payer: Self-pay | Admitting: Family Medicine

## 2011-08-01 ENCOUNTER — Ambulatory Visit (INDEPENDENT_AMBULATORY_CARE_PROVIDER_SITE_OTHER): Payer: Self-pay | Admitting: Family Medicine

## 2011-08-01 DIAGNOSIS — W19XXXA Unspecified fall, initial encounter: Secondary | ICD-10-CM

## 2011-08-01 DIAGNOSIS — M545 Low back pain, unspecified: Secondary | ICD-10-CM

## 2011-08-01 MED ORDER — NAPROXEN 500 MG PO TABS: 500.0000 mg | ORAL_TABLET | Freq: Two times a day (BID) | ORAL | Status: DC

## 2011-08-01 MED ORDER — CYCLOBENZAPRINE HCL 10 MG PO TABS: 10.0000 mg | ORAL_TABLET | Freq: Three times a day (TID) | ORAL | Status: DC | PRN

## 2011-08-01 MED ORDER — OXYCODONE-ACETAMINOPHEN 5-325 MG PO TABS: 1.0000 | ORAL_TABLET | Freq: Four times a day (QID) | ORAL | Status: DC | PRN

## 2011-08-01 NOTE — Assessment & Plan Note (Signed)
Will treat by scheduling Naproxen for one week, the use PRN.  Also Rx for flexeril and percocet as needed.  Advised pt to use heat or ice, and to continue to move as much as possible.

## 2011-08-01 NOTE — Progress Notes (Signed)
  Subjective:    Patient ID: Ashley [name], female    DOB: 07/08/1950, 61 y.o.   MRN: [id number]  HPI  Ashley [name] comes in after a fall last night.  She says she was walking down the stairs in the dark and lost her footing and fell about 5 stairs.  She is not sure how she landed, but says it may have been on her back/bottom.  She says that it hurt immediately, but she did not hear a pop.  Since then the pain has been worsening, and her back has been getting tighter.  She rates the pain as 10/10.  She denies any incontinence, numbness or tingling in legs or feet, or any weakness.  However she does say it his hard to walk due to the pain.    Review of Systems Pertinent items in HPI.     Objective:   Physical Exam BP 130/80  Pulse 84  Temp(Src) 98 F (36.7 C) (Oral)  Ht 5\' 3"  (1.6 m)  Wt 166 lb (75.297 kg)  BMI 29.41 kg/m2 General appearance: alert, cooperative and moderate distress Neck: Full ROM, without tenderness or deformity.  Back: Pt is tender in the midline from about L3 down.  She is also tender and has muscle spasm in the lumbar muscles bilaterally.   Tenderness goes down into the sacrum.  There is no one point that is particularly tender.  She has good ROM and no deformity Leg strength is 5/5 and equal Lower extremity sensation is in tact.        Assessment & Plan:

## 2011-08-01 NOTE — Assessment & Plan Note (Signed)
Concern for lumbar or coccyx fracture considering level of pain.  Will obtain X rays.  Pt denies any symptoms that would indicate her fall was from cardiac problem.

## 2011-08-01 NOTE — Patient Instructions (Signed)
I am sorry you are hurting so badly.  I want to get an x-ray of your back to make sure you did not break anything.  You can take the percocet and flexeril as needed for pain and muscle spasm.  I have written a prescription for naproxen, which I want you to take twice a day every day for one week.  Take that medication with food, it is an anti-inflammatory and will work differently than the other medications to help the pain. You can also use ice or heat on your back, whichever feels better.

## 2011-09-08 ENCOUNTER — Other Ambulatory Visit: Payer: Self-pay | Admitting: Family Medicine

## 2011-10-14 ENCOUNTER — Other Ambulatory Visit: Payer: Self-pay | Admitting: Family Medicine

## 2011-11-20 ENCOUNTER — Other Ambulatory Visit: Payer: Self-pay | Admitting: Family Medicine

## 2011-12-30 ENCOUNTER — Emergency Department (INDEPENDENT_AMBULATORY_CARE_PROVIDER_SITE_OTHER)
Admission: EM | Admit: 2011-12-30 | Discharge: 2011-12-30 | Disposition: A | Discharge status: Home / Self Care | Payer: Self-pay | Source: Home / Self Care

## 2011-12-30 ENCOUNTER — Encounter (HOSPITAL_COMMUNITY): Payer: Self-pay

## 2011-12-30 DIAGNOSIS — M549 Dorsalgia, unspecified: Secondary | ICD-10-CM

## 2011-12-30 DIAGNOSIS — G8929 Other chronic pain: Secondary | ICD-10-CM

## 2011-12-30 LAB — POCT URINALYSIS DIP (DEVICE)
Bilirubin Urine: NEGATIVE
Leukocytes, UA: NEGATIVE
Nitrite: NEGATIVE
Protein, ur: 100 mg/dL — AB
Urobilinogen, UA: 0.2 mg/dL (ref 0.0–1.0)
pH: 6 (ref 5.0–8.0)

## 2011-12-30 MED ORDER — HYDROCODONE-ACETAMINOPHEN 5-325 MG PO TABS
ORAL_TABLET | ORAL | Status: AC
  Filled 2011-12-30: qty 1

## 2011-12-30 MED ORDER — DEXAMETHASONE SODIUM PHOSPHATE 10 MG/ML IJ SOLN: 10.0000 mg | Freq: Once | INTRAMUSCULAR | Status: DC

## 2011-12-30 MED ORDER — METHYLPREDNISOLONE 4 MG PO KIT: PACK | ORAL | Status: AC

## 2011-12-30 MED ORDER — HYDROCODONE-ACETAMINOPHEN 5-325 MG PO TABS
1.0000 | ORAL_TABLET | Freq: Once | ORAL | Status: AC
  Administered 2011-12-30: 1 via ORAL

## 2011-12-30 MED ORDER — OXYCODONE-ACETAMINOPHEN 5-325 MG PO TABS: 1.0000 | ORAL_TABLET | Freq: Four times a day (QID) | ORAL | Status: AC | PRN

## 2011-12-30 MED ORDER — OXYCODONE-ACETAMINOPHEN 5-325 MG PO TABS: 1.0000 | ORAL_TABLET | Freq: Four times a day (QID) | ORAL | Status: DC | PRN

## 2011-12-30 NOTE — ED Provider Notes (Signed)
Medical screening examination/treatment/procedure(s) were performed by non-physician practitioner and as supervising physician I was immediately available for consultation/collaboration.  Philipp Deputy, M.D.   Harden Mo, MD 12/30/11 [phone]

## 2011-12-30 NOTE — ED Provider Notes (Signed)
History     CSN: [id number]  Arrival date & time 12/30/11  1508   None     Chief Complaint  Patient presents with  . Flank Pain    (Consider location/radiation/quality/duration/timing/severity/associated sxs/prior treatment) Patient is a 61 y.o. female presenting with flank pain. The history is provided by the patient.  Flank Pain  complains of right low back pain described as intermittent sharp in nature that began 4 days ago radiating into right hip and tingling in second toe. The pain is aggravated with standing and walking, pain is worse when going up stairs.  No known injury noted.  Retired, reports history of back pain for which she is on pain medication prescribed by her primary care physician.  Denies urinary symptoms.  Pain is 7/10. No red flags such as fevers, age >74, h/o trauma with bony tenderness, neurological deficits, h/o CA, unexplained weight loss, pain worse at night, pain at rest,  h/o prolonged steroid use or h/o osteopenia.    Past Medical History  Diagnosis Date  . Ulcer 1990    gastric ulcer. Ruptured s/p emergency repair  . Arthritis   . Pancreatitis 2000    resolved  . 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 Hyperlipidemia   . Hypertension   . Cervical radiculopathy 02/28/2011    Past Surgical History  Procedure Date  . Abdominal hysterectomy 1979    Family History  Problem Relation Age of Onset  . Hypertension Father   . Cancer Father   . Hyperlipidemia Father   . Seizures Sister     History  Substance Use Topics  . Smoking status: Current Everyday Smoker -- 0.3 packs/day    Types: Cigarettes  . Smokeless tobacco: Never Used  . Alcohol Use: 0.0 oz/week    0 Cans of beer per week    OB History    Grav Para Term Preterm Abortions TAB SAB Ect Mult Living                  Review of Systems  Genitourinary: Positive for flank pain.  All other systems reviewed and are negative.    Allergies  Aspirin and Ibuprofen  Home  Medications   Current Outpatient Rx  Name Route Sig Dispense Refill  . AMLODIPINE BESYLATE 10 MG PO TABS  take 1 tablet by mouth once daily 30 tablet 12  . CYCLOBENZAPRINE HCL 10 MG PO TABS Oral Take 1 tablet (10 mg total) by mouth every 8 (eight) hours as needed for muscle spasms. 30 tablet 1  . GABAPENTIN 100 MG PO CAPS Oral Take 1 capsule (100 mg total) by mouth 3 (three) times daily. 90 capsule 1  . LISINOPRIL 10 MG PO TABS Oral Take 1 tablet (10 mg total) by mouth daily. 90 tablet 3  . METHYLPREDNISOLONE 4 MG PO KIT  follow package directions 21 tablet 0  . NAPROXEN 500 MG PO TABS Oral Take 1 tablet (500 mg total) by mouth 2 (two) times daily with a meal. 60 tablet 0  . OXYCODONE-ACETAMINOPHEN 5-325 MG PO TABS Oral Take 1 tablet by mouth every 6 (six) hours as needed for pain. 12 tablet 0  . OXYCODONE-ACETAMINOPHEN 5-325 MG PO TABS Oral Take 1 tablet by mouth every 6 (six) hours as needed for pain. 30 tablet 0  . PRAVASTATIN SODIUM 40 MG PO TABS Oral Take 40 mg by mouth daily.       BP 116/77  Pulse 78  Temp 98.1 F (  36.7 C) (Oral)  Resp 18  SpO2 94%  Physical Exam  Nursing note and vitals reviewed. Constitutional: She is oriented to person, place, and time. Vital signs are normal. She appears well-developed and well-nourished. She is active and cooperative.  HENT:  Head: Normocephalic.  Eyes: Conjunctivae are normal. Pupils are equal, round, and reactive to light. No scleral icterus.  Neck: Trachea normal. Neck supple.  Cardiovascular: Normal rate and regular rhythm.   Pulmonary/Chest: Effort normal and breath sounds normal.  Musculoskeletal:       Right hip: Normal.       Right knee: Normal.       Right ankle: Normal.       Cervical back: Normal.       Thoracic back: Normal.       Lumbar back: She exhibits tenderness and spasm. She exhibits no bony tenderness.       Right foot: Normal.       Right paravertebral tenderness into right side/hip, negative straightleg raises,  painful right sided ROM of Lspine.  Neurological: She is alert and oriented to person, place, and time. No cranial nerve deficit or sensory deficit. Gait normal. GCS eye subscore is 4. GCS verbal subscore is 5. GCS motor subscore is 6.       Ambulatory with limp  Skin: Skin is warm and dry.  Psychiatric: She has a normal mood and affect. Her speech is normal and behavior is normal. Judgment and thought content normal. Cognition and memory are normal.    ED Course  Procedures (including critical care time)  Labs Reviewed  POCT URINALYSIS DIP (DEVICE) - Abnormal; Notable for the following:    Protein, ur 100 (*)     All other components within normal limits   No results found.   1. Chronic back pain       MDM  Analgesics, steroids, follow up with primary care provider, Rest, intermittent application of cold packs (later, may switch to heat, but do not sleep on heating pad), Consider physical therapy and X-ray studies if not improving. Call or return to clinic prn if these symptoms worsen or fail to improve as anticipated. Imaging not indicated at this time.          Awilda Metro, NP 12/30/11 1724

## 2011-12-30 NOTE — ED Notes (Signed)
States she was wakened from sleep yesterday w pain in her right flank and into her side and her urine is dark

## 2012-01-08 ENCOUNTER — Other Ambulatory Visit: Payer: Self-pay | Admitting: Family Medicine

## 2012-01-08 DIAGNOSIS — Z1231 Encounter for screening mammogram for malignant neoplasm of breast: Secondary | ICD-10-CM

## 2012-01-26 ENCOUNTER — Ambulatory Visit (HOSPITAL_COMMUNITY): Payer: Self-pay

## 2012-01-27 ENCOUNTER — Ambulatory Visit (HOSPITAL_COMMUNITY)
Admission: RE | Admit: 2012-01-27 | Discharge: 2012-01-27 | Disposition: A | Discharge status: Home / Self Care | Payer: Self-pay | Source: Ambulatory Visit | Attending: Family Medicine | Admitting: Family Medicine

## 2012-01-27 DIAGNOSIS — Z1231 Encounter for screening mammogram for malignant neoplasm of breast: Secondary | ICD-10-CM

## 2012-02-01 ENCOUNTER — Emergency Department (HOSPITAL_COMMUNITY)
Admission: EM | Admit: 2012-02-01 | Discharge: 2012-02-01 | Disposition: A | Discharge status: Home / Self Care | Payer: Self-pay | Attending: Emergency Medicine | Admitting: Emergency Medicine

## 2012-02-01 ENCOUNTER — Encounter (HOSPITAL_COMMUNITY): Payer: Self-pay | Admitting: *Deleted

## 2012-02-01 DIAGNOSIS — I10 Essential (primary) hypertension: Secondary | ICD-10-CM | POA: Insufficient documentation

## 2012-02-01 DIAGNOSIS — IMO0002 Reserved for concepts with insufficient information to code with codable children: Secondary | ICD-10-CM | POA: Insufficient documentation

## 2012-02-01 DIAGNOSIS — M5416 Radiculopathy, lumbar region: Secondary | ICD-10-CM

## 2012-02-01 DIAGNOSIS — M129 Arthropathy, unspecified: Secondary | ICD-10-CM | POA: Insufficient documentation

## 2012-02-01 DIAGNOSIS — E785 Hyperlipidemia, unspecified: Secondary | ICD-10-CM | POA: Insufficient documentation

## 2012-02-01 DIAGNOSIS — F17200 Nicotine dependence, unspecified, uncomplicated: Secondary | ICD-10-CM | POA: Insufficient documentation

## 2012-02-01 DIAGNOSIS — B1920 Unspecified viral hepatitis C without hepatic coma: Secondary | ICD-10-CM | POA: Insufficient documentation

## 2012-02-01 MED ORDER — OXYCODONE-ACETAMINOPHEN 5-325 MG PO TABS
1.0000 | ORAL_TABLET | Freq: Once | ORAL | Status: AC
  Administered 2012-02-01: 1 via ORAL
  Filled 2012-02-01: qty 1

## 2012-02-01 MED ORDER — OXYCODONE-ACETAMINOPHEN 5-325 MG PO TABS: 1.0000 | ORAL_TABLET | Freq: Four times a day (QID) | ORAL | Status: AC | PRN

## 2012-02-01 NOTE — ED Provider Notes (Addendum)
History  Scribed for Ashley Dessert, MD, the patient was seen in room TR09C/TR09C. This chart was scribed by Truddie Coco. The patient's care started at 5:43 PM  CSN: [id number]  Arrival date & time 02/01/12  1432   First MD Initiated Contact with Patient 02/01/12 1714      Chief Complaint  Patient presents with  . Hip Pain  . Back Pain    Patient is a 61 y.o. female presenting with back pain. The history is provided by the patient. No language interpreter was used.  Back Pain    Ashley [name] is a 61 y.o. female who presents to the Emergency Department complaining of right hip and lower back pain that started yesterday.  Pt has h/o back problems.  She states that she tried to help her sister stand up three days ago.  She has taken OTC meds with no relief.  Walking makes the pain worse.  Past Medical History  Diagnosis Date  . Ulcer 1990    gastric ulcer. Ruptured s/p emergency repair  . Arthritis   . Pancreatitis 2000    resolved  . 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 Hyperlipidemia   . Hypertension   . Cervical radiculopathy 02/28/2011    Past Surgical History  Procedure Date  . Abdominal hysterectomy 1979    Family History  Problem Relation Age of Onset  . Hypertension Father   . Cancer Father   . Hyperlipidemia Father   . Seizures Sister     History  Substance Use Topics  . Smoking status: Current Everyday Smoker -- 0.3 packs/day    Types: Cigarettes  . Smokeless tobacco: Never Used  . Alcohol Use: 0.0 oz/week    0 Cans of beer per week    OB History    Grav Para Term Preterm Abortions TAB SAB Ect Mult Living                  Review of Systems  Musculoskeletal: Positive for back pain and arthralgias (right hip pain).  All other systems reviewed and are negative.    Allergies  Aspirin and Ibuprofen  Home Medications   Current Outpatient Rx  Name Route Sig Dispense Refill  . AMLODIPINE BESYLATE 10 MG PO TABS  take 1 tablet by mouth  once daily 30 tablet 12  . LISINOPRIL 10 MG PO TABS Oral Take 1 tablet (10 mg total) by mouth daily. 90 tablet 3    BP 104/70  Pulse 78  Temp 98.5 F (36.9 C) (Oral)  Resp 18  SpO2 100%  Physical Exam  Nursing note and vitals reviewed. Constitutional: She is oriented to person, place, and time. She appears well-developed and well-nourished. No distress.  HENT:  Head: Normocephalic and atraumatic.  Eyes: Conjunctivae are normal. Pupils are equal, round, and reactive to light.  Neck: Normal range of motion.  Pulmonary/Chest: Effort normal. No respiratory distress.  Abdominal: There is no tenderness.  Musculoskeletal:       Pain in the right para lumbar region that radiates in L2/L3 area with tenderness in groin area.  No tenderness with ROM of the hip.  Normal sensation and distal pulses.  Neurological: She is alert and oriented to person, place, and time.  Skin: Skin is warm and dry. She is not diaphoretic.  Psychiatric: She has a normal mood and affect. Her behavior is normal.    ED Course  Procedures   DIAGNOSTIC STUDIES: Oxygen Saturation is 100% on  room air, normal by my interpretation.    COORDINATION OF CARE:     Labs Reviewed - No data to display No results found.   1. Lumbar radiculopathy       MDM   Pt with gradual onset of back pain suggestive of radiculopathy in the L2-3 region.  No neurovascular compromise and no incontinence.  Pt has no infectious sx, hx of CA  or other red flags concerning for pathologic back pain.  Pt is able to ambulate but is painful.  Normal strength and reflexes on exam.  Denies trauma. Will give pt pain control and to return for developement of above sx.   I personally performed the services described in this documentation, which was scribed in my presence.  The recorded information has been reviewed and considered.       Ashley Dessert, MD 02/01/12 1751  Ashley Dessert, MD 02/01/12 [id number]

## 2012-02-01 NOTE — ED Notes (Signed)
Patient stated that she was trying to help sister get up after her sister fell and must have pulled something. Patient stated that her pain to the rt hip radiating to the back started Friday. Pt is able to ambulate but limping.

## 2012-02-01 NOTE — ED Notes (Signed)
To ED for eval of right hip and lower back pain that radiates down right leg. Pt denies injury. Pain started yesterday when waking. OTC meds not relieving pain.

## 2012-03-10 ENCOUNTER — Encounter (HOSPITAL_COMMUNITY): Payer: Self-pay | Admitting: *Deleted

## 2012-03-10 ENCOUNTER — Emergency Department (HOSPITAL_COMMUNITY)
Admission: EM | Admit: 2012-03-10 | Discharge: 2012-03-10 | Disposition: A | Discharge status: Home / Self Care | Payer: Self-pay | Attending: Emergency Medicine | Admitting: Emergency Medicine

## 2012-03-10 DIAGNOSIS — M545 Low back pain, unspecified: Secondary | ICD-10-CM | POA: Insufficient documentation

## 2012-03-10 DIAGNOSIS — I10 Essential (primary) hypertension: Secondary | ICD-10-CM | POA: Insufficient documentation

## 2012-03-10 DIAGNOSIS — F17200 Nicotine dependence, unspecified, uncomplicated: Secondary | ICD-10-CM | POA: Insufficient documentation

## 2012-03-10 DIAGNOSIS — Z886 Allergy status to analgesic agent status: Secondary | ICD-10-CM | POA: Insufficient documentation

## 2012-03-10 DIAGNOSIS — G8929 Other chronic pain: Secondary | ICD-10-CM | POA: Insufficient documentation

## 2012-03-10 DIAGNOSIS — X500XXA Overexertion from strenuous movement or load, initial encounter: Secondary | ICD-10-CM | POA: Insufficient documentation

## 2012-03-10 DIAGNOSIS — Z79899 Other long term (current) drug therapy: Secondary | ICD-10-CM | POA: Insufficient documentation

## 2012-03-10 MED ORDER — OXYCODONE-ACETAMINOPHEN 7.5-325 MG PO TABS: 1.0000 | ORAL_TABLET | ORAL | Status: DC | PRN

## 2012-03-10 MED ORDER — CYCLOBENZAPRINE HCL 10 MG PO TABS: 10.0000 mg | ORAL_TABLET | Freq: Two times a day (BID) | ORAL | Status: DC | PRN

## 2012-03-10 NOTE — ED Provider Notes (Signed)
History    This chart was scribed for Ashley Lanes, MD, MD by Rhae Lerner. The patient was seen in room [place] and the patient's care was started at 3:49PM.   CSN: [id number]  Arrival date & time 03/10/12  1524      Chief Complaint  Patient presents with  . Back Pain     Patient is a 61 y.o. female presenting with back pain. The history is provided by the patient. No language interpreter was used.  Back Pain  Pertinent negatives include no fever and no weakness.   Ashley [name] is a 61 y.o. female w/ hx of chronic lower back pain who presents to the Emergency Department complaining of constant, moderate lower back pain onset 1 day ago. 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 Pt reports that she has not taken medication PTA. Denies any other pain.    Past Medical History  Diagnosis Date  . Ulcer 1990    gastric ulcer. Ruptured s/p emergency repair  . Arthritis   . Pancreatitis 2000    resolved  . 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 Hyperlipidemia   . Hypertension   . Cervical radiculopathy 02/28/2011    Past Surgical History  Procedure Date  . Abdominal hysterectomy 1979    Family History  Problem Relation Age of Onset  . Hypertension Father   . Cancer Father   . Hyperlipidemia Father   . Seizures Sister     History  Substance Use Topics  . Smoking status: Current Every Day Smoker -- 0.3 packs/day    Types: Cigarettes  . Smokeless tobacco: Never Used  . Alcohol Use: 0.0 oz/week    0 Cans of beer per week     occ    OB History    Grav Para Term Preterm Abortions TAB SAB Ect Mult Living                  Review of Systems  Constitutional: Negative for fever and chills.  Respiratory: Negative for shortness of breath.   Gastrointestinal: Negative for nausea and vomiting.  Musculoskeletal: Positive for back pain.  Neurological: Negative for weakness.  All other systems reviewed and are  negative.    Allergies  Aspirin and Ibuprofen  Home Medications   Current Outpatient Rx  Name Route Sig Dispense Refill  . AMLODIPINE BESYLATE 10 MG PO TABS  take 1 tablet by mouth once daily 30 tablet 12  . LISINOPRIL 10 MG PO TABS Oral Take 1 tablet (10 mg total) by mouth daily. 90 tablet 3    BP 96/70  Pulse 85  Temp 98.3 F (36.8 C) (Oral)  Resp 18  SpO2 99%  Physical Exam  Nursing note and vitals reviewed. Constitutional: She is oriented to person, place, and time. She appears well-developed. No distress.  HENT:  Head: Normocephalic and atraumatic.  Eyes: Pupils are equal, round, and reactive to light.  Neck: Normal range of motion.  Cardiovascular: Normal rate and intact distal pulses.   Pulmonary/Chest: No respiratory distress.  Abdominal: Normal appearance. She exhibits no distension.  Musculoskeletal: Normal range of motion.       Lumbar back: She exhibits tenderness, pain and spasm.  Neurological: She is alert and oriented to person, place, and time. No cranial nerve deficit.  Skin: Skin is warm and dry. No rash noted.  Psychiatric: She has a  normal mood and affect. Her behavior is normal.    ED Course  Procedures (including critical care time) DIAGNOSTIC STUDIES: Oxygen Saturation is 99% on room air, normal by my interpretation.    COORDINATION OF CARE: 3:51 PM Discussed ED treatment with pt     Labs Reviewed - No data to display No results found.   1. Chronic back pain       MDM         I personally performed the services described in this documentation, which was scribed in my presence. The recorded information has been reviewed and considered.    Ashley Lanes, MD 03/10/12 [phone]

## 2012-03-10 NOTE — ED Notes (Signed)
Pt is here with lower back pain from having to constantly pick up sister off the floor

## 2012-04-10 ENCOUNTER — Other Ambulatory Visit: Payer: Self-pay | Admitting: Cardiology

## 2012-04-13 ENCOUNTER — Ambulatory Visit (INDEPENDENT_AMBULATORY_CARE_PROVIDER_SITE_OTHER): Payer: Self-pay | Admitting: Family Medicine

## 2012-04-13 ENCOUNTER — Encounter: Payer: Self-pay | Admitting: Family Medicine

## 2012-04-13 VITALS — BP 149/92 | HR 82 | Temp 97.7°F | Wt 151.0 lb

## 2012-04-13 DIAGNOSIS — M545 Low back pain, unspecified: Secondary | ICD-10-CM

## 2012-04-13 DIAGNOSIS — I10 Essential (primary) hypertension: Secondary | ICD-10-CM

## 2012-04-13 DIAGNOSIS — M5412 Radiculopathy, cervical region: Secondary | ICD-10-CM

## 2012-04-13 MED ORDER — OXYCODONE-ACETAMINOPHEN 7.5-325 MG PO TABS: 1.0000 | ORAL_TABLET | ORAL | Status: DC | PRN

## 2012-04-13 MED ORDER — AMLODIPINE BESYLATE 10 MG PO TABS: 10.0000 mg | ORAL_TABLET | Freq: Every day | ORAL | Status: DC

## 2012-04-13 MED ORDER — CYCLOBENZAPRINE HCL 10 MG PO TABS: 10.0000 mg | ORAL_TABLET | Freq: Two times a day (BID) | ORAL | Status: DC | PRN

## 2012-04-13 NOTE — Assessment & Plan Note (Signed)
Pt with chronic neck and back pain, currently with flare-up likely related to increased stress from recent death of her daughter - Prescribed flexeril 20 tabs and percocet 10 tabs to help patient with acutely worsened pain, recommended also trying tylenol if pain manageable - Recommend follow-up with Dr. Adrian Blackwater in 1-2 weeks to further evaluate back and neck pain and management, as well as to check on patient going through grief process right now

## 2012-04-13 NOTE — Patient Instructions (Addendum)
It was good to meet you today.  I am really sorry to hear about your daughter and that you are having a hard time. - For your neck and back pain, I am prescribing some percocet and flexeril to get you through this flare-up.  Only take them when necessary.  Otherwise, you can try tylenol as needed, not to exceed recommended dose. - Set up follow-up with your primary doctor, Dr. Adrian Blackwater, in 2-3 weeks to see how you are doing and how your pain is. - I also refilled your amlodipine for 1 month and will let Dr. Adrian Blackwater continue to manage that.  Thanks and take care.

## 2012-04-13 NOTE — Progress Notes (Signed)
Subjective:     Patient ID: Ashley [name], female   DOB: 11-11-1950, 61 y.o.   MRN: [id number]  Neck and back pain  HPI  This is a 61 y.o. female with h/o cervical radiculopathy, low back pain, HLD, HTN, MSK pain, and right shoulder rotator cuff syndrome.  Of note, patient's daughter passed away 6 days ago.  Pt notes that 2 days ago she started having worsening of her chronic neck and back pain.  Her neck is 10/10 piercing pain and back is an aggravating pain.  These symptoms make it hard for her to do anything, and she is very busy right now trying to deal with her daughter's death.  She ran out of flexeril and percocet prescribed from the ED 03/10/12 for neck and back pain, which worked.  She has tried tylenol but this has not helped.  Heating pad helps some.  Denies fevers, instability, bowel or bladder dysfunction, swelling, or changes in activity.  Pain extends from back of head down neck into back, down to lumbar spine.  It is worse when she sits for long periods and includes a stiffness.  Reports not having ever tried physical therapy.  Since death of her daughter, she has some decreased interest, concentration, appetite, and sleep.  Pt is not currently suicidal or homicidal.    Review of Systems Per HPI; otherwise within normal limits. Other PMH reviewed.  Pt would like a refill on amlodipine which she has run out of.    Objective:   Physical Exam BP 149/92  Pulse 82  Temp 97.7 F (36.5 C) (Oral)  Wt 151 lb (68.493 kg) GEN: NAD, sitting at edge of bed and in tears MSK: Neck with full ROM but with pain, back with ROM limited by pain on forward flexion at the hips to 60 degrees, and backward extension limited to 15 degrees by sharp pain, tenderness to palpation right paraspinal muscles down to lumbar spine and right trapezius muscle.  No erythema, swelling, or effusion.  Ambulates normally but slightly slowly and appearing stiff     Assessment:     This is a 61 y.o. female with h/o  cervical radiculopathy, low back pain, HLD, HTN, MSK pain, and right shoulder rotator cuff syndrome, here today with flare-up of chronic neck and back pain 2 days ago, with death of her daughter 6 days ago.    Plan:

## 2012-04-13 NOTE — Assessment & Plan Note (Signed)
149/92 today, and patient requesting refill on amlodipine, has enough lisinopril - Refilled x 1 month; f/u with Dr. Adrian Blackwater for further management

## 2012-05-03 ENCOUNTER — Ambulatory Visit: Payer: Self-pay | Admitting: Family Medicine

## 2012-05-13 ENCOUNTER — Other Ambulatory Visit: Payer: Self-pay | Admitting: Family Medicine

## 2012-05-17 ENCOUNTER — Ambulatory Visit: Payer: Self-pay | Admitting: Family Medicine

## 2012-05-19 ENCOUNTER — Encounter: Payer: Self-pay | Admitting: Family Medicine

## 2012-05-19 ENCOUNTER — Ambulatory Visit (INDEPENDENT_AMBULATORY_CARE_PROVIDER_SITE_OTHER): Payer: Self-pay | Admitting: Family Medicine

## 2012-05-19 VITALS — BP 130/78 | HR 80 | Temp 98.2°F | Ht 63.0 in | Wt 153.0 lb

## 2012-05-19 DIAGNOSIS — M545 Low back pain, unspecified: Secondary | ICD-10-CM

## 2012-05-19 DIAGNOSIS — M5412 Radiculopathy, cervical region: Secondary | ICD-10-CM

## 2012-05-19 DIAGNOSIS — M549 Dorsalgia, unspecified: Secondary | ICD-10-CM

## 2012-05-19 MED ORDER — GABAPENTIN 100 MG PO CAPS: 300.0000 mg | ORAL_CAPSULE | Freq: Every day | ORAL | Status: DC

## 2012-05-19 MED ORDER — OXYCODONE-ACETAMINOPHEN 7.5-325 MG PO TABS: 1.0000 | ORAL_TABLET | ORAL | Status: DC | PRN

## 2012-05-19 MED ORDER — CYCLOBENZAPRINE HCL 10 MG PO TABS: 10.0000 mg | ORAL_TABLET | Freq: Two times a day (BID) | ORAL | Status: DC | PRN

## 2012-05-19 NOTE — Assessment & Plan Note (Signed)
  For your back pain: You are having muscle spasm likely for stress.   1. Refill percocet 10 tabs. No additional refills.  2. Flexeril 30 tabs.  3. Gabapentin 300 mg nightly.  4. Twice daily back exercises. Since PT is not an option at the moment.   F/u in one month.

## 2012-05-19 NOTE — Progress Notes (Signed)
Subjective:     Patient ID: Ashley [name], female   DOB: 03-Jan-1951, 61 y.o.   MRN: [id number]  HPI 61 yo F presents for evaluation of 6 weeks of persistent L scapula pain and bilateral low back pain. She denies weakness in her upper extremities, lower extremities, radicular pain down bottom or legs and tingling/numbness. She has been out of percocet and flexeril for 3 days. Pain was well controlled on this regimen. Additionally, she has not been sleeping well as she is still grieving the death of her daughter who died 2/2 end stage renal disease.   Review of Systems As per HPI    Objective:   Physical Exam BP 130/78  Pulse 80  Temp 98.2 F (36.8 C) (Oral)  Ht 5\' 3"  (1.6 m)  Wt 153 lb (69.4 kg)  BMI 27.10 kg/m2 General appearance: alert, cooperative, no distress and teary during exam.  Back: symmetric, no curvature. ROM normal. No CVA tenderness. normal grip strength. Negative straight led raising. L infraspinatus tenderness. Bilateral low back pain L>R with muscle tenderness on L at L4 level.     Assessment and Plan:

## 2012-05-19 NOTE — Patient Instructions (Addendum)
Ms. Saleeby,   Thank you for coming in today.   For your back pain: You are having muscle spasm likely for stress.   1. Refill percocet 10 tabs. No additional refills.  2. Flexeril 30 tabs.  3. Gabapentin 300 mg nightly.  4. Twice daily back exercises. Since PT is not an option at the moment.   F/u in one month.   Dr. Adrian Blackwater   Back Exercises Back exercises help treat and prevent back injuries. The goal of back exercises is to increase the strength of your abdominal and back muscles and the flexibility of your back. These exercises should be started when you no longer have back pain. Back exercises include:  Pelvic Tilt. Lie on your back with your knees bent. Tilt your pelvis until the lower part of your back is against the floor. Hold this position 5 to 10 sec and repeat 5 to 10 times.  Knee to Chest. Pull first 1 knee up against your chest and hold for 20 to 30 seconds, repeat this with the other knee, and then both knees. This may be done with the other leg straight or bent, whichever feels better.  Sit-Ups or Curl-Ups. Bend your knees 90 degrees. Start with tilting your pelvis, and do a partial, slow sit-up, lifting your trunk only 30 to 45 degrees off the floor. Take at least 2 to 3 seconds for each sit-up. Do not do sit-ups with your knees out straight. If partial sit-ups are difficult, simply do the above but with only tightening your abdominal muscles and holding it as directed.  Hip-Lift. Lie on your back with your knees flexed 90 degrees. Push down with your feet and shoulders as you raise your hips a couple inches off the floor; hold for 10 seconds, repeat 5 to 10 times.  Back arches. Lie on your stomach, propping yourself up on bent elbows. Slowly press on your hands, causing an arch in your low back. Repeat 3 to 5 times. Any initial stiffness and discomfort should lessen with repetition over time.  Shoulder-Lifts. Lie face down with arms beside your body. Keep hips and torso  pressed to floor as you slowly lift your head and shoulders off the floor. Do not overdo your exercises, especially in the beginning. Exercises may cause you some mild back discomfort which lasts for a few minutes; however, if the pain is more severe, or lasts for more than 15 minutes, do not continue exercises until you see your caregiver. Improvement with exercise therapy for back problems is slow.  See your caregivers for assistance with developing a proper back exercise program. Document Released: 07/03/2004 Document Revised: 08/18/2011 Document Reviewed: 03/27/2011 [facility] Patient Information 2013 [place].

## 2012-06-13 ENCOUNTER — Other Ambulatory Visit: Payer: Self-pay | Admitting: Cardiology

## 2012-06-16 ENCOUNTER — Ambulatory Visit: Payer: Self-pay | Admitting: Family Medicine

## 2012-06-29 ENCOUNTER — Other Ambulatory Visit: Payer: Self-pay | Admitting: *Deleted

## 2012-06-30 ENCOUNTER — Ambulatory Visit: Payer: Self-pay | Admitting: Family Medicine

## 2012-07-01 MED ORDER — LISINOPRIL 10 MG PO TABS: 10.0000 mg | ORAL_TABLET | Freq: Every day | ORAL | Status: DC

## 2012-08-08 ENCOUNTER — Encounter (HOSPITAL_COMMUNITY): Payer: Self-pay | Admitting: *Deleted

## 2012-08-08 ENCOUNTER — Emergency Department (HOSPITAL_COMMUNITY)
Admission: EM | Admit: 2012-08-08 | Discharge: 2012-08-08 | Disposition: A | Discharge status: Home / Self Care | Payer: Self-pay | Attending: Emergency Medicine | Admitting: Emergency Medicine

## 2012-08-08 DIAGNOSIS — K089 Disorder of teeth and supporting structures, unspecified: Secondary | ICD-10-CM | POA: Insufficient documentation

## 2012-08-08 DIAGNOSIS — I10 Essential (primary) hypertension: Secondary | ICD-10-CM | POA: Insufficient documentation

## 2012-08-08 DIAGNOSIS — K0889 Other specified disorders of teeth and supporting structures: Secondary | ICD-10-CM

## 2012-08-08 DIAGNOSIS — Z8619 Personal history of other infectious and parasitic diseases: Secondary | ICD-10-CM | POA: Insufficient documentation

## 2012-08-08 DIAGNOSIS — Z8639 Personal history of other endocrine, nutritional and metabolic disease: Secondary | ICD-10-CM | POA: Insufficient documentation

## 2012-08-08 DIAGNOSIS — F17200 Nicotine dependence, unspecified, uncomplicated: Secondary | ICD-10-CM | POA: Insufficient documentation

## 2012-08-08 DIAGNOSIS — Z862 Personal history of diseases of the blood and blood-forming organs and certain disorders involving the immune mechanism: Secondary | ICD-10-CM | POA: Insufficient documentation

## 2012-08-08 DIAGNOSIS — Z8719 Personal history of other diseases of the digestive system: Secondary | ICD-10-CM | POA: Insufficient documentation

## 2012-08-08 DIAGNOSIS — Z79899 Other long term (current) drug therapy: Secondary | ICD-10-CM | POA: Insufficient documentation

## 2012-08-08 DIAGNOSIS — Z8739 Personal history of other diseases of the musculoskeletal system and connective tissue: Secondary | ICD-10-CM | POA: Insufficient documentation

## 2012-08-08 MED ORDER — PENICILLIN V POTASSIUM 500 MG PO TABS: 500.0000 mg | ORAL_TABLET | Freq: Four times a day (QID) | ORAL | Status: AC

## 2012-08-08 MED ORDER — OXYCODONE-ACETAMINOPHEN 5-325 MG PO TABS: 2.0000 | ORAL_TABLET | Freq: Once | ORAL | Status: DC

## 2012-08-08 MED ORDER — OXYCODONE-ACETAMINOPHEN 5-325 MG PO TABS
2.0000 | ORAL_TABLET | Freq: Once | ORAL | Status: AC
  Administered 2012-08-08: 2 via ORAL
  Filled 2012-08-08: qty 2

## 2012-08-08 MED ORDER — CYCLOBENZAPRINE HCL 10 MG PO TABS: 10.0000 mg | ORAL_TABLET | Freq: Once | ORAL | Status: DC

## 2012-08-08 MED ORDER — OXYCODONE-ACETAMINOPHEN 5-325 MG PO TABS: 1.0000 | ORAL_TABLET | Freq: Four times a day (QID) | ORAL | Status: DC | PRN

## 2012-08-08 NOTE — ED Notes (Signed)
Reports right lower toothache since Friday. Airway intact.

## 2012-08-08 NOTE — ED Provider Notes (Signed)
History     CSN: [id number]  Arrival date & time 08/08/12  1307   First MD Initiated Contact with Patient 08/08/12 1336      Chief Complaint  Patient presents with  . Dental Pain    (Consider location/radiation/quality/duration/timing/severity/associated sxs/prior treatment) HPI Comments: Patient presenting with a chief complaint of left lower molar tooth pain.  Pain has been present for the past 2-3 days and is gradually worsening.  She has been taking Ibuprofen for the pain without relief.  She currently does not have a dentist.  Patient is a 62 y.o. female presenting with tooth pain. The history is provided by the patient.  Dental PainThe primary symptoms include mouth pain. Primary symptoms do not include dental injury, oral bleeding or fever. The symptoms occur constantly.  Additional symptoms include: dental sensitivity to temperature, gum swelling and gum tenderness. Additional symptoms do not include: purulent gums, trismus, facial swelling, trouble swallowing, pain with swallowing and drooling.    Past Medical History  Diagnosis Date  . Ulcer 1990    gastric ulcer. Ruptured s/p emergency repair  . Arthritis   . Pancreatitis 2000    resolved  . 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 Hyperlipidemia   . Hypertension   . Cervical radiculopathy 02/28/2011    Past Surgical History  Procedure Laterality Date  . Abdominal hysterectomy  1979    Family History  Problem Relation Age of Onset  . Hypertension Father   . Cancer Father   . Hyperlipidemia Father   . Seizures Sister   . Early death Daughter   . Kidney disease Daughter     end stage dialysis dependent     History  Substance Use Topics  . Smoking status: Current Every Day Smoker -- 0.30 packs/day    Types: Cigarettes  . Smokeless tobacco: Never Used  . Alcohol Use: 0.0 oz/week    0 Cans of beer per week     Comment: occ    OB History   Grav Para Term Preterm Abortions TAB SAB Ect Mult Living                   Review of Systems  Constitutional: Negative for fever and chills.  HENT: Positive for dental problem. Negative for facial swelling, drooling, trouble swallowing, neck pain and neck stiffness.   All other systems reviewed and are negative.    Allergies  Aspirin and Ibuprofen  Home Medications   Current Outpatient Rx  Name  Route  Sig  Dispense  Refill  . amLODipine (NORVASC) 10 MG tablet      take 1 tablet by mouth once daily   90 tablet   0   . lisinopril (PRINIVIL,ZESTRIL) 10 MG tablet   Oral   Take 1 tablet (10 mg total) by mouth daily.   90 tablet   3   . oxyCODONE-acetaminophen (PERCOCET/ROXICET) 5-325 MG per tablet   Oral   Take 1-2 tablets by mouth every 6 (six) hours as needed for pain.   20 tablet   0   . penicillin v potassium (VEETID) 500 MG tablet   Oral   Take 1 tablet (500 mg total) by mouth 4 (four) times daily.   40 tablet   0     BP 120/68  Pulse 91  Temp(Src) 98.3 F (36.8 C) (Oral)  Resp 18  SpO2 97%  Physical Exam  Nursing note and vitals reviewed. Constitutional: She is oriented to person, place,  and time. She appears well-developed and well-nourished. No distress.  HENT:  Head: Normocephalic and atraumatic. No trismus in the jaw.  Mouth/Throat: Uvula is midline, oropharynx is clear and moist and mucous membranes are normal. Abnormal dentition. No dental abscesses or edematous. No oropharyngeal exudate, posterior oropharyngeal edema, posterior oropharyngeal erythema or tonsillar abscesses.  Poor dental hygiene. Pt able to open and close mouth with out difficulty. Airway intact. Uvula midline. Mild gingival swelling with tenderness over affected area, but no fluctuance. No swelling or tenderness of submental and submandibular regions. No sublingual tenderness No elevation of the tongue  Eyes: Conjunctivae and EOM are normal.  Neck: Normal range of motion and full passive range of motion without pain. Neck supple.   Cardiovascular: Normal rate and regular rhythm.   Pulmonary/Chest: Effort normal and breath sounds normal. No respiratory distress. She has no wheezes.  Musculoskeletal: Normal range of motion.  Lymphadenopathy:       Head (right side): No submental, no submandibular, no tonsillar, no preauricular and no posterior auricular adenopathy present.       Head (left side): No submental, no submandibular, no tonsillar, no preauricular and no posterior auricular adenopathy present.  Neurological: She is alert and oriented to person, place, and time.  Skin: Skin is warm and dry. No rash noted. She is not diaphoretic.    ED Course  Procedures (including critical care time)  Labs Reviewed - No data to display No results found.   1. Pain, dental    Looked up the patient in the narcotic database.  No recent narcotic prescriptions.   MDM  Patient with toothache.  No gross abscess.  Exam unconcerning for Ludwig's angina or spread of infection.  Will treat with penicillin and pain medicine.  Urged patient to follow-up with dentist.          Sherlyn Lees Sylvia, PA-C 08/08/12 1621

## 2012-08-10 NOTE — ED Provider Notes (Signed)
Medical screening examination/treatment/procedure(s) were performed by non-physician practitioner and as supervising physician I was immediately available for consultation/collaboration.   Alfonzo Feller, DO 08/10/12 1314

## 2012-09-08 ENCOUNTER — Other Ambulatory Visit: Payer: Self-pay | Admitting: Family Medicine

## 2012-10-14 ENCOUNTER — Encounter (HOSPITAL_COMMUNITY): Payer: Self-pay | Admitting: Emergency Medicine

## 2012-10-14 ENCOUNTER — Emergency Department (HOSPITAL_COMMUNITY)
Admission: EM | Admit: 2012-10-14 | Discharge: 2012-10-14 | Disposition: A | Discharge status: Home / Self Care | Payer: Self-pay | Attending: Emergency Medicine | Admitting: Emergency Medicine

## 2012-10-14 ENCOUNTER — Emergency Department (HOSPITAL_COMMUNITY): Payer: Self-pay

## 2012-10-14 DIAGNOSIS — Z862 Personal history of diseases of the blood and blood-forming organs and certain disorders involving the immune mechanism: Secondary | ICD-10-CM | POA: Insufficient documentation

## 2012-10-14 DIAGNOSIS — Z79899 Other long term (current) drug therapy: Secondary | ICD-10-CM | POA: Insufficient documentation

## 2012-10-14 DIAGNOSIS — Z8719 Personal history of other diseases of the digestive system: Secondary | ICD-10-CM | POA: Insufficient documentation

## 2012-10-14 DIAGNOSIS — Z8639 Personal history of other endocrine, nutritional and metabolic disease: Secondary | ICD-10-CM | POA: Insufficient documentation

## 2012-10-14 DIAGNOSIS — IMO0002 Reserved for concepts with insufficient information to code with codable children: Secondary | ICD-10-CM | POA: Insufficient documentation

## 2012-10-14 DIAGNOSIS — Z8619 Personal history of other infectious and parasitic diseases: Secondary | ICD-10-CM | POA: Insufficient documentation

## 2012-10-14 DIAGNOSIS — Y92009 Unspecified place in unspecified non-institutional (private) residence as the place of occurrence of the external cause: Secondary | ICD-10-CM | POA: Insufficient documentation

## 2012-10-14 DIAGNOSIS — W108XXA Fall (on) (from) other stairs and steps, initial encounter: Secondary | ICD-10-CM | POA: Insufficient documentation

## 2012-10-14 DIAGNOSIS — M549 Dorsalgia, unspecified: Secondary | ICD-10-CM

## 2012-10-14 DIAGNOSIS — Z8739 Personal history of other diseases of the musculoskeletal system and connective tissue: Secondary | ICD-10-CM | POA: Insufficient documentation

## 2012-10-14 DIAGNOSIS — F17200 Nicotine dependence, unspecified, uncomplicated: Secondary | ICD-10-CM | POA: Insufficient documentation

## 2012-10-14 DIAGNOSIS — Y9301 Activity, walking, marching and hiking: Secondary | ICD-10-CM | POA: Insufficient documentation

## 2012-10-14 DIAGNOSIS — I10 Essential (primary) hypertension: Secondary | ICD-10-CM | POA: Insufficient documentation

## 2012-10-14 MED ORDER — KETOROLAC TROMETHAMINE 60 MG/2ML IM SOLN
60.0000 mg | Freq: Once | INTRAMUSCULAR | Status: AC
  Administered 2012-10-14: 60 mg via INTRAMUSCULAR
  Filled 2012-10-14: qty 2

## 2012-10-14 MED ORDER — ACETAMINOPHEN 500 MG PO TABS: 500.0000 mg | ORAL_TABLET | Freq: Four times a day (QID) | ORAL | Status: DC | PRN

## 2012-10-14 MED ORDER — CYCLOBENZAPRINE HCL 10 MG PO TABS: 10.0000 mg | ORAL_TABLET | Freq: Two times a day (BID) | ORAL | Status: DC | PRN

## 2012-10-14 MED ORDER — DIAZEPAM 5 MG PO TABS
5.0000 mg | ORAL_TABLET | Freq: Once | ORAL | Status: DC
  Filled 2012-10-14: qty 1

## 2012-10-14 NOTE — ED Provider Notes (Signed)
History    This chart was scribed for non-physician practitioner Noland Fordyce working with Carmin Muskrat, MD by Roxan Diesel, ED Scribe. This patient was seen in room TR11C/[facility]11C and the patient's care was started at 7:30 PM .   CSN: [id number]  Arrival date & time 10/14/12  1801     Chief Complaint  Patient presents with  . Back Pain     The history is provided by the patient. No language interpreter was used.    HPI Comments: Ashley [name] is a 62 y.o. female with history of chronic lower back pain who presents to the Emergency Department complaining of lower back pain that began 6 hours ago when pt fell while walking down the steps at home and landed on her tailbone.  Pt denies head impact with fall or other injuries.  She describes pain as sharp, exacerbated by movement, and radiating down right posterior thigh.  She denies head pain, neck pain, abdominal pain, urinary symptoms, bowel symptoms, numbness or tingling in legs,fever, chills or any other associated symptoms .  Pt has not attempted to treat pain.  Pt has HTN and medicates for that condition.  She denies bone problems.  Pt cannot take ibuprofen or aspirin because it causes her ulcers to burst.  PCP is Dr. Adrian Blackwater.   Past Medical History  Diagnosis Date  . Ulcer 1990    gastric ulcer. Ruptured s/p emergency repair  . Arthritis   . Pancreatitis 2000    resolved  . 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 Hyperlipidemia   . Hypertension   . Cervical radiculopathy 02/28/2011    Past Surgical History  Procedure Laterality Date  . Abdominal hysterectomy  1979    Family History  Problem Relation Age of Onset  . Hypertension Father   . Cancer Father   . Hyperlipidemia Father   . Seizures Sister   . Early death Daughter   . Kidney disease Daughter     end stage dialysis dependent     History  Substance Use Topics  . Smoking status: Current Every Day Smoker -- 0.30 packs/day    Types: Cigarettes  .  Smokeless tobacco: Never Used  . Alcohol Use: 0.0 oz/week    0 Cans of beer per week     Comment: occ    OB History   Grav Para Term Preterm Abortions TAB SAB Ect Mult Living                  Review of Systems  Constitutional: Negative for fever and chills.  HENT: Negative for neck pain.   Gastrointestinal: Negative for abdominal pain, diarrhea and constipation.  Genitourinary: Negative for dysuria and difficulty urinating.  Musculoskeletal: Positive for back pain.  All other systems reviewed and are negative.    Allergies  Aspirin and Ibuprofen  Home Medications   Current Outpatient Rx  Name  Route  Sig  Dispense  Refill  . Acetaminophen (TYLENOL PO)   Oral   Take 2 tablets by mouth once.         Marland Kitchen amLODipine (NORVASC) 10 MG tablet      take 1 tablet by mouth once daily   90 tablet   0   . lisinopril (PRINIVIL,ZESTRIL) 10 MG tablet   Oral   Take 1 tablet (10 mg total) by mouth daily.   90 tablet   3   . acetaminophen (TYLENOL) 500 MG tablet   Oral   Take  1 tablet (500 mg total) by mouth every 6 (six) hours as needed for pain.   30 tablet   0   . cyclobenzaprine (FLEXERIL) 10 MG tablet   Oral   Take 1 tablet (10 mg total) by mouth 2 (two) times daily as needed for muscle spasms.   10 tablet   0     BP 128/76  Pulse 81  Temp(Src) 98.1 F (36.7 C) (Oral)  Resp 15  Ht 5\' 2"  (1.575 m)  Wt 150 lb (68.04 kg)  BMI 27.43 kg/m2  SpO2 98%  Physical Exam  Nursing note and vitals reviewed. Constitutional: She is oriented to person, place, and time. She appears well-developed and well-nourished. No distress.  HENT:  Head: Normocephalic and atraumatic.  Eyes: EOM are normal.  Neck: Normal range of motion. Neck supple. No tracheal deviation present.  No midline cervical tenderness, crepitus or step-offs.  Cardiovascular: Normal rate.   Pulmonary/Chest: Effort normal. No respiratory distress.  Abdominal: Soft. There is no tenderness.  Musculoskeletal:  Normal range of motion. She exhibits tenderness (Lower lumbar spine, R>L). She exhibits no edema.  Neurological: She is alert and oriented to person, place, and time.  Straight leg negative Sensation intact 5/5 lower extremities Antalgic gate  Skin: Skin is warm and dry.  Intact, no ecchymosis or redness  Psychiatric: She has a normal mood and affect. Her behavior is normal.  Tearful    ED Course  Procedures (including critical care time)  DIAGNOSTIC STUDIES: Oxygen Saturation is 98% on room air, normal by my interpretation.    COORDINATION OF CARE: 7:34 PM-Discussed treatment plan which includes imaging, pain medication and f/u with orthopedist with pt at bedside and pt agreed to plan.      Labs Reviewed - No data to display Dg Lumbar Spine Complete  10/14/2012  *RADIOLOGY REPORT*  Clinical Data: Back pain  LUMBAR SPINE - COMPLETE 4+ VIEW  Comparison: Plain films 01/17/2011  Findings: Normal alignment of the lumbar vertebral bodies.  No loss of vertebral body height or disc height.  No pars fracture.  IMPRESSION: No acute findings lumbar spine.   Original Report Authenticated By: Suzy Bouchard, M.D.      1. Back pain, acute       MDM  Pt with a hx of chronic back pain presenting after falling at home on steps onto her lower back.  Denies hitting head or other injuries from fall.  Denies trying anything for pain PTA.  Denies change in bowel or bladder habits.  Denies numbness or tingling in legs.  Plain films: neg for fx   Tx in ED: Toradol injection and valium.. Pt refused valium and threw remote at RN stating she "I'm not taking this fucking medicine cuz it's not going to help my pain."  Rx: acetaminophen.  Pt has been demanding and rude to myself and nursing staff during entire visit even after I appologized for the wait time.  Pt has hx of chronic back pain.  Do not feel comfortable prescribing narcotics at this time.  Pt may follow up with PCP Dr. Rudean Hitt or pain  management for on going back pain.    I personally performed the services described in this documentation, which was scribed in my presence. The recorded information has been reviewed and is accurate.      Noland Fordyce, PA-C 10/15/12 1553

## 2012-10-14 NOTE — ED Notes (Signed)
Pt c/o lower back pain after slipping today; pt sts lower back pain

## 2012-10-14 NOTE — ED Notes (Signed)
Pt refused to sign signature pad. Stated, "I'm getting the fuck out of here if you motherfuckers aren't going to give me something stronger for my back pain.".

## 2012-10-14 NOTE — ED Notes (Signed)
Patient transported to X-ray 

## 2012-10-15 NOTE — ED Provider Notes (Signed)
  Medical screening examination/treatment/procedure(s) were performed by non-physician practitioner and as supervising physician I was immediately available for consultation/collaboration.   Carmin Muskrat, MD 10/15/12 [phone]

## 2012-12-07 ENCOUNTER — Other Ambulatory Visit: Payer: Self-pay | Admitting: Family Medicine

## 2012-12-08 NOTE — Telephone Encounter (Signed)
Will refill for 3 months but patient is due for return to office for a checkup.

## 2013-01-22 ENCOUNTER — Emergency Department (HOSPITAL_COMMUNITY)
Admission: EM | Admit: 2013-01-22 | Discharge: 2013-01-22 | Disposition: A | Discharge status: Home / Self Care | Payer: Self-pay | Attending: Emergency Medicine | Admitting: Emergency Medicine

## 2013-01-22 ENCOUNTER — Encounter (HOSPITAL_COMMUNITY): Payer: Self-pay | Admitting: *Deleted

## 2013-01-22 DIAGNOSIS — Z872 Personal history of diseases of the skin and subcutaneous tissue: Secondary | ICD-10-CM | POA: Insufficient documentation

## 2013-01-22 DIAGNOSIS — Z8739 Personal history of other diseases of the musculoskeletal system and connective tissue: Secondary | ICD-10-CM | POA: Insufficient documentation

## 2013-01-22 DIAGNOSIS — F17200 Nicotine dependence, unspecified, uncomplicated: Secondary | ICD-10-CM | POA: Insufficient documentation

## 2013-01-22 DIAGNOSIS — K0889 Other specified disorders of teeth and supporting structures: Secondary | ICD-10-CM

## 2013-01-22 DIAGNOSIS — Z79899 Other long term (current) drug therapy: Secondary | ICD-10-CM | POA: Insufficient documentation

## 2013-01-22 DIAGNOSIS — I10 Essential (primary) hypertension: Secondary | ICD-10-CM | POA: Insufficient documentation

## 2013-01-22 DIAGNOSIS — M7989 Other specified soft tissue disorders: Secondary | ICD-10-CM | POA: Insufficient documentation

## 2013-01-22 DIAGNOSIS — Z8619 Personal history of other infectious and parasitic diseases: Secondary | ICD-10-CM | POA: Insufficient documentation

## 2013-01-22 DIAGNOSIS — K089 Disorder of teeth and supporting structures, unspecified: Secondary | ICD-10-CM | POA: Insufficient documentation

## 2013-01-22 DIAGNOSIS — Z8711 Personal history of peptic ulcer disease: Secondary | ICD-10-CM | POA: Insufficient documentation

## 2013-01-22 DIAGNOSIS — M129 Arthropathy, unspecified: Secondary | ICD-10-CM | POA: Insufficient documentation

## 2013-01-22 DIAGNOSIS — Z8719 Personal history of other diseases of the digestive system: Secondary | ICD-10-CM | POA: Insufficient documentation

## 2013-01-22 DIAGNOSIS — Z8639 Personal history of other endocrine, nutritional and metabolic disease: Secondary | ICD-10-CM | POA: Insufficient documentation

## 2013-01-22 DIAGNOSIS — Z862 Personal history of diseases of the blood and blood-forming organs and certain disorders involving the immune mechanism: Secondary | ICD-10-CM | POA: Insufficient documentation

## 2013-01-22 MED ORDER — PENICILLIN V POTASSIUM 500 MG PO TABS: 500.0000 mg | ORAL_TABLET | Freq: Three times a day (TID) | ORAL | Status: DC

## 2013-01-22 MED ORDER — TRAMADOL HCL 50 MG PO TABS
50.0000 mg | ORAL_TABLET | Freq: Once | ORAL | Status: DC
  Filled 2013-01-22: qty 1

## 2013-01-22 MED ORDER — TRAMADOL HCL 50 MG PO TABS: 50.0000 mg | ORAL_TABLET | Freq: Four times a day (QID) | ORAL | Status: DC | PRN

## 2013-01-22 NOTE — ED Provider Notes (Signed)
CSN: [id number]     Arrival date & time 01/22/13  1553 History  This chart was scribed for non-physician practitioner, Domenic Moras, PA-C working with Saddie Benders. Dorna Mai, MD by Frederich Balding, ED scribe. This patient was seen in room TR08C/TR08C and the patient's care was started at 4:05 PM.   Chief Complaint  Patient presents with  . Dental Pain   The history is provided by the patient. No language interpreter was used.    HPI Comments: Ashley [name] is a 62 y.o. female who presents to the Emergency Department complaining of gradual onset, constant sharp, throbbing right lower dental pain that started yesterday. She states any type of movement of her mouth worsens the pain. Pt has tried Tylenol with no relief. Pt states she has been having diarrhea. She states she hasn't been to the dentist yet. Pt denies fever, CP and SOB as associated symptoms.   Past Medical History  Diagnosis Date  . Ulcer 1990    gastric ulcer. Ruptured s/p emergency repair  . Arthritis   . Pancreatitis 2000    resolved  . 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 Hyperlipidemia   . Hypertension   . Cervical radiculopathy 02/28/2011   Past Surgical History  Procedure Laterality Date  . Abdominal hysterectomy  1979   Family History  Problem Relation Age of Onset  . Hypertension Father   . Cancer Father   . Hyperlipidemia Father   . Seizures Sister   . Early death Daughter   . Kidney disease Daughter     end stage dialysis dependent    History  Substance Use Topics  . Smoking status: Current Every Day Smoker -- 0.30 packs/day    Types: Cigarettes  . Smokeless tobacco: Never Used  . Alcohol Use: 0.0 oz/week    0 Cans of beer per week     Comment: occ   OB History   Grav Para Term Preterm Abortions TAB SAB Ect Mult Living                 Review of Systems  Constitutional: Negative for fever.  HENT: Positive for dental problem.   Respiratory: Negative for shortness of breath.   Cardiovascular:  Negative for chest pain.  All other systems reviewed and are negative.    Allergies  Aspirin and Ibuprofen  Home Medications   Current Outpatient Rx  Name  Route  Sig  Dispense  Refill  . Acetaminophen (TYLENOL PO)   Oral   Take 2 tablets by mouth once.         Marland Kitchen amLODipine (NORVASC) 10 MG tablet   Oral   Take 1 tablet (10 mg total) by mouth daily. Please follow up with MD for checkup prior to more refills.   90 tablet   0   . lisinopril (PRINIVIL,ZESTRIL) 10 MG tablet   Oral   Take 1 tablet (10 mg total) by mouth daily.   90 tablet   3    BP 128/77  Pulse 82  Temp(Src) 98.6 F (37 C) (Oral)  Resp 18  SpO2 99%  Physical Exam  Nursing note and vitals reviewed. Constitutional: She is oriented to person, place, and time. She appears well-developed and well-nourished. No distress.  HENT:  Head: Normocephalic and atraumatic.  Second molar on right lower jaw with moderate tenderness. Mild decay noted. No gingival erythema. No obvious abscess. Mild trismus.  Eyes: EOM are normal.  Neck: Neck supple. No tracheal deviation  present.  No lymphadenopathy.   Cardiovascular: Normal rate.   Pulmonary/Chest: Effort normal. No respiratory distress.  Musculoskeletal: Normal range of motion.  Neurological: She is alert and oriented to person, place, and time.  Skin: Skin is warm and dry.  Psychiatric: She has a normal mood and affect. Her behavior is normal.    ED Course   Procedures (including critical care time)  DIAGNOSTIC STUDIES: Oxygen Saturation is 99% on RA, normal by my interpretation.    COORDINATION OF CARE: 4:44 PM-Discussed treatment plan which includes pain medication and antibiotic with pt at bedside and pt agreed to plan. Advised pt to follow up with a dentist.   Labs Reviewed - No data to display No results found. 1. Pain, dental     MDM  BP 128/77  Pulse 82  Temp(Src) 98.6 F (37 C) (Oral)  Resp 18  SpO2 99%  I have reviewed nursing notes  and vital signs.  I reviewed available ER/hospitalization records thought the EMR   I personally performed the services described in this documentation, which was scribed in my presence. The recorded information has been reviewed and is accurate.    Domenic Moras, PA-C 01/22/13 1649

## 2013-01-22 NOTE — ED Notes (Signed)
Pt c/o L lower molar pain.

## 2013-01-22 NOTE — ED Notes (Signed)
Entered room to administer medication and discharge pt. Pt was not in room.

## 2013-01-23 NOTE — ED Provider Notes (Signed)
Medical screening examination/treatment/procedure(s) were performed by non-physician practitioner and as supervising physician I was immediately available for consultation/collaboration.   Saddie Benders. Tamaka Sawin, MD 01/23/13 1737

## 2013-03-15 ENCOUNTER — Other Ambulatory Visit: Payer: Self-pay | Admitting: Family Medicine

## 2013-03-16 ENCOUNTER — Telehealth: Payer: Self-pay | Admitting: Family Medicine

## 2013-03-16 NOTE — Telephone Encounter (Signed)
Just refilled via "refill encounter".  Ashley Sinclair, MD

## 2013-03-16 NOTE — Telephone Encounter (Signed)
Pt called and needs a refill on her BP medication amlodipine sent her pharmacy. JW

## 2013-03-16 NOTE — Telephone Encounter (Signed)
Will forward to MD. Jazmin Hartsell,CMA  

## 2013-03-16 NOTE — Telephone Encounter (Signed)
Will refill ,but Please call patient to let her know to follow up as she has not been seen in clinic since December and I have not met her yet. Hilton Sinclair, MD

## 2013-03-17 NOTE — Telephone Encounter (Signed)
Left message for pt to call back and schedule an appt. Jazmin Hartsell,CMA

## 2013-05-09 HISTORY — PX: ESOPHAGOGASTRODUODENOSCOPY: SHX1529

## 2013-05-15 ENCOUNTER — Encounter (HOSPITAL_COMMUNITY): Payer: Self-pay | Admitting: Emergency Medicine

## 2013-05-15 ENCOUNTER — Emergency Department (HOSPITAL_COMMUNITY): Payer: Self-pay

## 2013-05-15 ENCOUNTER — Emergency Department (HOSPITAL_COMMUNITY)
Admission: EM | Admit: 2013-05-15 | Discharge: 2013-05-15 | Disposition: A | Discharge status: Home / Self Care | Payer: Self-pay | Attending: Emergency Medicine | Admitting: Emergency Medicine

## 2013-05-15 DIAGNOSIS — F17200 Nicotine dependence, unspecified, uncomplicated: Secondary | ICD-10-CM | POA: Insufficient documentation

## 2013-05-15 DIAGNOSIS — R6883 Chills (without fever): Secondary | ICD-10-CM | POA: Insufficient documentation

## 2013-05-15 DIAGNOSIS — R197 Diarrhea, unspecified: Secondary | ICD-10-CM | POA: Insufficient documentation

## 2013-05-15 DIAGNOSIS — Z888 Allergy status to other drugs, medicaments and biological substances status: Secondary | ICD-10-CM | POA: Insufficient documentation

## 2013-05-15 DIAGNOSIS — Z79899 Other long term (current) drug therapy: Secondary | ICD-10-CM | POA: Insufficient documentation

## 2013-05-15 DIAGNOSIS — M129 Arthropathy, unspecified: Secondary | ICD-10-CM | POA: Insufficient documentation

## 2013-05-15 DIAGNOSIS — Z8719 Personal history of other diseases of the digestive system: Secondary | ICD-10-CM | POA: Insufficient documentation

## 2013-05-15 DIAGNOSIS — R05 Cough: Secondary | ICD-10-CM | POA: Insufficient documentation

## 2013-05-15 DIAGNOSIS — R000 Tachycardia, unspecified: Secondary | ICD-10-CM | POA: Insufficient documentation

## 2013-05-15 DIAGNOSIS — R059 Cough, unspecified: Secondary | ICD-10-CM | POA: Insufficient documentation

## 2013-05-15 DIAGNOSIS — E785 Hyperlipidemia, unspecified: Secondary | ICD-10-CM | POA: Insufficient documentation

## 2013-05-15 DIAGNOSIS — B349 Viral infection, unspecified: Secondary | ICD-10-CM

## 2013-05-15 DIAGNOSIS — R5381 Other malaise: Secondary | ICD-10-CM | POA: Insufficient documentation

## 2013-05-15 DIAGNOSIS — B9789 Other viral agents as the cause of diseases classified elsewhere: Secondary | ICD-10-CM | POA: Insufficient documentation

## 2013-05-15 DIAGNOSIS — R630 Anorexia: Secondary | ICD-10-CM | POA: Insufficient documentation

## 2013-05-15 DIAGNOSIS — R110 Nausea: Secondary | ICD-10-CM | POA: Insufficient documentation

## 2013-05-15 DIAGNOSIS — Z8739 Personal history of other diseases of the musculoskeletal system and connective tissue: Secondary | ICD-10-CM | POA: Insufficient documentation

## 2013-05-15 DIAGNOSIS — I10 Essential (primary) hypertension: Secondary | ICD-10-CM | POA: Insufficient documentation

## 2013-05-15 DIAGNOSIS — R0602 Shortness of breath: Secondary | ICD-10-CM | POA: Insufficient documentation

## 2013-05-15 DIAGNOSIS — N289 Disorder of kidney and ureter, unspecified: Secondary | ICD-10-CM | POA: Insufficient documentation

## 2013-05-15 LAB — BASIC METABOLIC PANEL
BUN: 16 mg/dL (ref 6–23)
Calcium: 8.6 mg/dL (ref 8.4–10.5)
GFR calc Af Amer: 33 mL/min — ABNORMAL LOW (ref >90)
GFR calc non Af Amer: 29 mL/min — ABNORMAL LOW (ref >90)
Glucose, Bld: 101 mg/dL — ABNORMAL HIGH (ref 70–99)
Potassium: 3.9 mEq/L (ref 3.5–5.1)
Sodium: 134 mEq/L — ABNORMAL LOW (ref 135–145)

## 2013-05-15 LAB — CBC
HCT: 37.8 % (ref 36.0–46.0)
MCHC: 34.7 g/dL (ref 30.0–36.0)
RDW: 14.6 % (ref 11.5–15.5)
WBC: 14.3 10*3/uL — ABNORMAL HIGH (ref 4.0–10.5)

## 2013-05-15 MED ORDER — ALBUTEROL SULFATE HFA 108 (90 BASE) MCG/ACT IN AERS
2.0000 | INHALATION_SPRAY | RESPIRATORY_TRACT | Status: DC | PRN
  Administered 2013-05-15: 2 via RESPIRATORY_TRACT
  Filled 2013-05-15: qty 6.7

## 2013-05-15 MED ORDER — PROMETHAZINE HCL 25 MG PO TABS: 25.0000 mg | ORAL_TABLET | Freq: Four times a day (QID) | ORAL | Status: DC | PRN

## 2013-05-15 MED ORDER — MORPHINE SULFATE 4 MG/ML IJ SOLN
4.0000 mg | Freq: Once | INTRAMUSCULAR | Status: AC
  Administered 2013-05-15: 4 mg via INTRAVENOUS
  Filled 2013-05-15: qty 1

## 2013-05-15 MED ORDER — SODIUM CHLORIDE 0.9 % IV BOLUS (SEPSIS)
500.0000 mL | Freq: Once | INTRAVENOUS | Status: AC
  Administered 2013-05-15: 500 mL via INTRAVENOUS

## 2013-05-15 MED ORDER — ONDANSETRON HCL 4 MG/2ML IJ SOLN
4.0000 mg | Freq: Once | INTRAMUSCULAR | Status: AC
  Administered 2013-05-15: 4 mg via INTRAVENOUS
  Filled 2013-05-15: qty 2

## 2013-05-15 MED ORDER — ALBUTEROL SULFATE (5 MG/ML) 0.5% IN NEBU
5.0000 mg | INHALATION_SOLUTION | Freq: Once | RESPIRATORY_TRACT | Status: AC
  Administered 2013-05-15: 5 mg via RESPIRATORY_TRACT
  Filled 2013-05-15: qty 1

## 2013-05-15 NOTE — ED Provider Notes (Signed)
CSN: [id number]     Arrival date & time 05/15/13  1126 History   First MD Initiated Contact with Patient 05/15/13 1147     Chief Complaint  Patient presents with  . Chest Pain  . Shortness of Breath  . Cough   (Consider location/radiation/quality/duration/timing/severity/associated sxs/prior Treatment) Patient is a 62 y.o. female presenting with chest pain, shortness of breath, and cough. The history is provided by the patient.  Chest Pain Associated symptoms: cough, fatigue, nausea and shortness of breath   Associated symptoms: no abdominal pain, no back pain, no headache, no numbness, not vomiting and no weakness   Shortness of Breath Associated symptoms: chest pain and cough   Associated symptoms: no abdominal pain, no headaches, no rash, no sore throat and no vomiting   Cough Associated symptoms: chest pain, chills and shortness of breath   Associated symptoms: no headaches, no rash and no sore throat    this is a patient has had cough with some production. She states she's had cold chills. She states she had had nausea and diarrhea also. Her muscles ache. She's had a decreased appetite. She states her granddaughters had similar symptoms it is also here in the ER.  Past Medical History  Diagnosis Date  . Ulcer 1990    gastric ulcer. Ruptured s/p emergency repair  . Arthritis   . Pancreatitis 2000    resolved  . 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 Hyperlipidemia   . Hypertension   . Cervical radiculopathy 02/28/2011   Past Surgical History  Procedure Laterality Date  . Abdominal hysterectomy  1979   Family History  Problem Relation Age of Onset  . Hypertension Father   . Cancer Father   . Hyperlipidemia Father   . Seizures Sister   . Early death Daughter   . Kidney disease Daughter     end stage dialysis dependent    History  Substance Use Topics  . Smoking status: Current Every Day Smoker -- 0.30 packs/day    Types: Cigarettes  . Smokeless tobacco: Never  Used  . Alcohol Use: 0.0 oz/week    0 Cans of beer per week     Comment: occ   OB History   Grav Para Term Preterm Abortions TAB SAB Ect Mult Living                 Review of Systems  Constitutional: Positive for chills, appetite change and fatigue. Negative for activity change.  HENT: Negative for sore throat.   Eyes: Negative for pain.  Respiratory: Positive for cough and shortness of breath. Negative for chest tightness.   Cardiovascular: Positive for chest pain. Negative for leg swelling.  Gastrointestinal: Positive for nausea and diarrhea. Negative for vomiting and abdominal pain.  Genitourinary: Negative for dysuria and flank pain.  Musculoskeletal: Negative for back pain and neck stiffness.  Skin: Negative for rash.  Neurological: Negative for weakness, numbness and headaches.  Psychiatric/Behavioral: Negative for behavioral problems.    Allergies  Aspirin and Ibuprofen  Home Medications   Current Outpatient Rx  Name  Route  Sig  Dispense  Refill  . amLODipine (NORVASC) 10 MG tablet   Oral   Take 10 mg by mouth daily.         Marland Kitchen acetaminophen (TYLENOL) 500 MG tablet   Oral   Take 1,000 mg by mouth daily as needed for mild pain.         Marland Kitchen lisinopril (PRINIVIL,ZESTRIL) 10 MG tablet  Oral   Take 10 mg by mouth daily.         . promethazine (PHENERGAN) 25 MG tablet   Oral   Take 25 mg by mouth every 6 (six) hours as needed for nausea or vomiting.         . traMADol (ULTRAM) 50 MG tablet   Oral   Take 50 mg by mouth every 6 (six) hours as needed for moderate pain.          BP 111/67  Pulse 78  Temp(Src) 99.2 F (37.3 C) (Oral)  Resp 18  Ht 5\' 3"  (1.6 m)  Wt 149 lb (67.586 kg)  BMI 26.40 kg/m2  SpO2 90% Physical Exam  Nursing note and vitals reviewed. Constitutional: She is oriented to person, place, and time. She appears well-developed and well-nourished.  HENT:  Head: Normocephalic and atraumatic.  Eyes: EOM are normal. Pupils are equal,  round, and reactive to light.  Neck: Normal range of motion. Neck supple.  Cardiovascular: Regular rhythm and normal heart sounds.   No murmur heard. Mild tachycardia  Pulmonary/Chest: Effort normal and breath sounds normal. No respiratory distress. She has no wheezes. She has no rales.  Abdominal: Soft. Bowel sounds are normal. She exhibits no distension. There is no tenderness. There is no rebound and no guarding.  Musculoskeletal: Normal range of motion.  Neurological: She is alert and oriented to person, place, and time. No cranial nerve deficit.  Skin: Skin is warm and dry.  Psychiatric: She has a normal mood and affect. Her speech is normal.    ED Course  Procedures (including critical care time) Labs Review Labs Reviewed  CBC - Abnormal; Notable for the following:    WBC 14.3 (*)    All other components within normal limits  BASIC METABOLIC PANEL - Abnormal; Notable for the following:    Sodium 134 (*)    Glucose, Bld 101 (*)    Creatinine, Ser 1.80 (*)    GFR calc non Af Amer 29 (*)    GFR calc Af Amer 33 (*)    All other components within normal limits  POCT I-STAT TROPONIN I   Imaging Review Dg Chest Port 1 View  05/15/2013   CLINICAL DATA:  Chest pain  EXAM: PORTABLE CHEST - 1 VIEW  COMPARISON:  02/16/2011  FINDINGS: Cardiomediastinal silhouette is stable. No acute infiltrate or pleural effusion. No pulmonary edema. Bony thorax is unremarkable.  IMPRESSION: No active disease.   Electronically Signed   By: Lahoma Crocker M.D.   On: 05/15/2013 12:11    EKG Interpretation    Date/Time:  Sunday May 15 2013 11:40:33 EST Ventricular Rate:  102 PR Interval:  112 QRS Duration: 72 QT Interval:  314 QTC Calculation: 409 R Axis:   45 Text Interpretation:  Sinus tachycardia Otherwise normal ECG Confirmed by Real Cona  MD, Saralyn Willison ([id number]) on 05/15/2013 12:04:29 PM            MDM   1. Viral infection   2. Renal insufficiency    Patient was likely viral infection.  Patient feels better after treatment. She has a mild renal insufficiency with appears to be new. Will follow with her primary care Dr.  and  Jasper Riling. Alvino Chapel, MD 05/16/13 [id number]

## 2013-05-15 NOTE — ED Notes (Signed)
Onset 3 days ago productive cough, chest pain, shortness of breath, and. lack of appetite.

## 2013-05-16 ENCOUNTER — Encounter (HOSPITAL_COMMUNITY): Payer: Self-pay | Admitting: Emergency Medicine

## 2013-05-16 ENCOUNTER — Emergency Department (HOSPITAL_COMMUNITY)
Admission: EM | Admit: 2013-05-16 | Discharge: 2013-05-16 | Discharge status: Against Medical Advice | Payer: Self-pay | Attending: Emergency Medicine | Admitting: Emergency Medicine

## 2013-05-16 DIAGNOSIS — M545 Low back pain, unspecified: Secondary | ICD-10-CM | POA: Insufficient documentation

## 2013-05-16 DIAGNOSIS — M549 Dorsalgia, unspecified: Secondary | ICD-10-CM

## 2013-05-16 DIAGNOSIS — F17200 Nicotine dependence, unspecified, uncomplicated: Secondary | ICD-10-CM | POA: Insufficient documentation

## 2013-05-16 DIAGNOSIS — I10 Essential (primary) hypertension: Secondary | ICD-10-CM | POA: Insufficient documentation

## 2013-05-16 NOTE — ED Provider Notes (Signed)
Patient left without being seen or evaluated by myself.   Harlow Mares, PA-C 05/16/13 1727

## 2013-05-16 NOTE — ED Notes (Addendum)
Pt. Was never in room 9. This RN was told by staff pt was guest of fast track 6-. Guest was ambulatory, in NAD steady gait. No dyspnea noted. Pt not present in fast track area at time to be assessed.

## 2013-05-16 NOTE — ED Notes (Signed)
Pt not in room.

## 2013-05-16 NOTE — ED Notes (Signed)
Pt in xray

## 2013-05-16 NOTE — ED Notes (Signed)
Pt. reports right lower back pain radiating to right leg denies injury or fall , ambulatory , no urinary discomfort or hematuria .

## 2013-05-17 NOTE — ED Provider Notes (Signed)
Medical screening examination/treatment/procedure(s) were performed by non-physician practitioner and as supervising physician I was immediately available for consultation/collaboration.  EKG Interpretation   None         Blanchard Kelch, MD 05/17/13 1259

## 2013-05-24 ENCOUNTER — Telehealth: Payer: Self-pay | Admitting: Family Medicine

## 2013-05-24 ENCOUNTER — Encounter: Payer: Self-pay | Admitting: Family Medicine

## 2013-05-24 ENCOUNTER — Ambulatory Visit (INDEPENDENT_AMBULATORY_CARE_PROVIDER_SITE_OTHER): Payer: Self-pay | Admitting: Family Medicine

## 2013-05-24 VITALS — BP 142/87 | HR 80 | Temp 98.7°F | Ht 63.0 in | Wt 149.0 lb

## 2013-05-24 DIAGNOSIS — R109 Unspecified abdominal pain: Secondary | ICD-10-CM

## 2013-05-24 DIAGNOSIS — I10 Essential (primary) hypertension: Secondary | ICD-10-CM

## 2013-05-24 DIAGNOSIS — N179 Acute kidney failure, unspecified: Secondary | ICD-10-CM

## 2013-05-24 DIAGNOSIS — R1033 Periumbilical pain: Secondary | ICD-10-CM | POA: Insufficient documentation

## 2013-05-24 LAB — COMPREHENSIVE METABOLIC PANEL
ALT: 11 U/L (ref 0–35)
Albumin: 3.1 g/dL — ABNORMAL LOW (ref 3.5–5.2)
BUN: 19 mg/dL (ref 6–23)
CO2: 22 mEq/L (ref 19–32)
Chloride: 109 mEq/L (ref 96–112)
Creat: 2.53 mg/dL — ABNORMAL HIGH (ref 0.50–1.10)
Glucose, Bld: 100 mg/dL — ABNORMAL HIGH (ref 70–99)
Potassium: 4.1 mEq/L (ref 3.5–5.3)
Total Bilirubin: 0.3 mg/dL (ref 0.3–1.2)

## 2013-05-24 LAB — POCT UA - MICROSCOPIC ONLY

## 2013-05-24 LAB — CBC
HCT: 37.2 % (ref 36.0–46.0)
Hemoglobin: 12.7 g/dL (ref 12.0–15.0)
Platelets: 641 10*3/uL — ABNORMAL HIGH (ref 150–400)
RBC: 4.29 MIL/uL (ref 3.87–5.11)
RDW: 15.1 % (ref 11.5–15.5)
WBC: 12.9 10*3/uL — ABNORMAL HIGH (ref 4.0–10.5)

## 2013-05-24 LAB — POCT URINALYSIS DIPSTICK
Bilirubin, UA: NEGATIVE
Glucose, UA: NEGATIVE
Ketones, UA: NEGATIVE
Leukocytes, UA: NEGATIVE
Urobilinogen, UA: 0.2

## 2013-05-24 LAB — POCT H PYLORI SCREEN: H Pylori Screen, POC: NEGATIVE

## 2013-05-24 MED ORDER — LOPERAMIDE HCL 2 MG PO CAPS: 2.0000 mg | ORAL_CAPSULE | ORAL | Status: DC | PRN

## 2013-05-24 MED ORDER — PANTOPRAZOLE SODIUM 40 MG PO TBEC: 40.0000 mg | DELAYED_RELEASE_TABLET | Freq: Two times a day (BID) | ORAL | Status: DC

## 2013-05-24 MED ORDER — ONDANSETRON 8 MG PO TBDP: 8.0000 mg | ORAL_TABLET | Freq: Three times a day (TID) | ORAL | Status: DC | PRN

## 2013-05-24 MED ORDER — HYDROCODONE-ACETAMINOPHEN 5-325 MG PO TABS: 1.0000 | ORAL_TABLET | Freq: Four times a day (QID) | ORAL | Status: DC | PRN

## 2013-05-24 MED ORDER — AMLODIPINE BESYLATE 10 MG PO TABS: 10.0000 mg | ORAL_TABLET | Freq: Every day | ORAL | Status: DC

## 2013-05-24 NOTE — Assessment & Plan Note (Signed)
A: ? Prerenal vs infrarenal. P: Continue to hold ACEi Repeat labs  Control nausea and slow diarrhea

## 2013-05-24 NOTE — Assessment & Plan Note (Addendum)
A: periumbilical abdominal pain subacute with nausea, emesis and diarrhea, AKI and hematuria.  Differentials: infectious gastroenterocolitis, renal stone, ischemic colitis, acute gastritis, appendicitis. Appendicitis and ischemic colitis less likely given stable vitals.  Infectious GI illness most likely. Would like to rule out renal pathology given hematuria and AKI P: continue to hold ACEi, no NSAIDS, liberal PO intake of fluids, vicodin for pain control, phenergan/zofran for nausea, lomotil to slow diarrhea. PPI.  Labs: CBC, CMP, HIV, considering adding lipase (exam not localized to pancreas, no ETOH).  Team CNAs to schedule CT abdomen in AM.  Cautioned patient and s/s to prompt ED visit. PCP f/u in one week.

## 2013-05-24 NOTE — Assessment & Plan Note (Signed)
A: declined. Holding lisinopril. Out of Norvasc.  Meds: none  P: refilled Norvasc.

## 2013-05-24 NOTE — Progress Notes (Signed)
   Subjective:    Patient ID: Ashley [name], female    DOB: July 04, 1950, 62 y.o.   MRN: [id number]  HPI 62 yo F presents for same day visit for the following:  1. Abdominal pain: X one week. Stable. 8/10. Worse with eating and bowel movement. Has movement immediately after eating.  Preceded by one week of nausea, NBNB emesis last episode 2 days ago, non-bloody diarrhea last episode today. Along with SOB adn chills. No fever. Sick contacts, infant child with recent PNA.  Tried tramadol for pain control w/o relief. Tried phenergan for nausea control w/ relief.   Work up: Seen in ED on 05/15/13 for CP and SOB: neg CXR, SOB improved with breathing treatment. Noted AKI with Cr 1.8,  slightly elevated WBC of 14.3. Dx with viral resp illness. Advised to hold lisinopril, and f/u with PCP.   Pertinent history: Hep C S/p Hysterectomy Smokes 1-2 cigs daily, denies tobacco/ETOH/IDU Reports history of ruptured "stomach" ulcer and repair-no evidence of this in records. Has gastric ulcer on problem list.  Mentioned "spot on kidney" no renal imaging done at recent ED visit. Found CT abdomen done in 2006 reveals R renal cyst.    Review of Systems As per HPI Gen: no weight loss GU: denies dysuria, hematuria, back pain MSK: admits to L hip pain and near falls onto L side Neuro: L hand numbness    Objective:   Physical Exam BP 142/87  Pulse 80  Temp(Src) 98.7 F (37.1 C) (Oral)  Ht 5\' 3"  (1.6 m)  Wt 149 lb (67.586 kg)  BMI 26.40 kg/m2 General appearance: alert, cooperative and no distress Eyes: conjunctivae/corneas clear. PERRL, EOM's intact.  Mouth: slightly dry mucus membrane (toungue and lips) Lungs: clear to auscultation bilaterally Heart: regular rate and rhythm, S1, S2 normal, no murmur, click, rub or gallop Abdomen: hyperactive, soft, TTP epigastric area, voluntary guarding, no masses.  Extremities: extremities normal, atraumatic, no cyanosis or edema Pulses: 2+ and symmetric Skin: Skin  color, texture, turgor normal. No rashes or lesions Neurologic: Grossly normal, slightly antalgic gait on the L.   Work up today: Urine dipstick shows positive for RBC's.  Micro exam: not a clean catch with 5-15 epis and clue cells. 1-5 WBC's per HPF, 10-15 RBC's per HPF, 2+ bacteria and 1-5 hyaline; occ granular; 0-2 waxy; 1 cellular  casts seen.  POCT H pylori: negative FOBT: negative   Labs ordered and pending: CBC, CMP, HIV Images ordered and need to be schedule: CT abdomen w/o contrast.      Assessment & Plan:

## 2013-05-24 NOTE — Telephone Encounter (Signed)
Attempted to call patient to ask her to start PPI Unable to leave VM.   Blue team please try again in AM.

## 2013-05-24 NOTE — Patient Instructions (Addendum)
Ms. Phon,  Thank you for coming in today. I am concerned about your abdominal pain, acute kidney injury and blood in your urine.   For work up: CT abdomen Labs: CBC and CMP  For symptom control: vicodin  For pain Lomotil to slow diarrea zofran and phenergan for nausea  My staff will schedule your CT and call you with the appt.  Do not take: lisinopril, NSAIDs ( ibuprofen, aleve/advil, naproxen or mobic).   Go to the ED if you develop worsening pain, fever, or persistent vomiting over night.   You will need to f/u with Dr. Darene Lamer.  Try to get on her schedule for next Monday.   Dr. Adrian Blackwater

## 2013-05-25 ENCOUNTER — Encounter (HOSPITAL_COMMUNITY): Payer: Self-pay | Admitting: Emergency Medicine

## 2013-05-25 ENCOUNTER — Telehealth: Payer: Self-pay | Admitting: Family Medicine

## 2013-05-25 ENCOUNTER — Inpatient Hospital Stay (HOSPITAL_COMMUNITY)
Admission: EM | Admit: 2013-05-25 | Discharge: 2013-05-30 | DRG: 683 | Disposition: A | Discharge status: Home / Self Care | Payer: Self-pay | Attending: Family Medicine | Admitting: Family Medicine

## 2013-05-25 DIAGNOSIS — R109 Unspecified abdominal pain: Secondary | ICD-10-CM | POA: Diagnosis present

## 2013-05-25 DIAGNOSIS — R1033 Periumbilical pain: Secondary | ICD-10-CM

## 2013-05-25 DIAGNOSIS — F1910 Other psychoactive substance abuse, uncomplicated: Secondary | ICD-10-CM | POA: Diagnosis present

## 2013-05-25 DIAGNOSIS — E440 Moderate protein-calorie malnutrition: Secondary | ICD-10-CM | POA: Diagnosis present

## 2013-05-25 DIAGNOSIS — D72829 Elevated white blood cell count, unspecified: Secondary | ICD-10-CM | POA: Diagnosis present

## 2013-05-25 DIAGNOSIS — K5900 Constipation, unspecified: Secondary | ICD-10-CM

## 2013-05-25 DIAGNOSIS — D649 Anemia, unspecified: Secondary | ICD-10-CM | POA: Diagnosis present

## 2013-05-25 DIAGNOSIS — K319 Disease of stomach and duodenum, unspecified: Secondary | ICD-10-CM | POA: Diagnosis present

## 2013-05-25 DIAGNOSIS — I10 Essential (primary) hypertension: Secondary | ICD-10-CM | POA: Diagnosis present

## 2013-05-25 DIAGNOSIS — F17200 Nicotine dependence, unspecified, uncomplicated: Secondary | ICD-10-CM | POA: Diagnosis present

## 2013-05-25 DIAGNOSIS — B1920 Unspecified viral hepatitis C without hepatic coma: Secondary | ICD-10-CM | POA: Diagnosis present

## 2013-05-25 DIAGNOSIS — Z6826 Body mass index (BMI) 26.0-26.9, adult: Secondary | ICD-10-CM

## 2013-05-25 DIAGNOSIS — E785 Hyperlipidemia, unspecified: Secondary | ICD-10-CM | POA: Diagnosis present

## 2013-05-25 DIAGNOSIS — K449 Diaphragmatic hernia without obstruction or gangrene: Secondary | ICD-10-CM | POA: Diagnosis present

## 2013-05-25 DIAGNOSIS — N179 Acute kidney failure, unspecified: Principal | ICD-10-CM | POA: Diagnosis present

## 2013-05-25 DIAGNOSIS — K279 Peptic ulcer, site unspecified, unspecified as acute or chronic, without hemorrhage or perforation: Secondary | ICD-10-CM

## 2013-05-25 HISTORY — DX: Cannabis abuse, uncomplicated: F12.10

## 2013-05-25 HISTORY — DX: Cocaine abuse, uncomplicated: F14.10

## 2013-05-25 LAB — URINE MICROSCOPIC-ADD ON

## 2013-05-25 LAB — CBC WITH DIFFERENTIAL/PLATELET
Basophils Absolute: 0 10*3/uL (ref 0.0–0.1)
Eosinophils Absolute: 0.3 10*3/uL (ref 0.0–0.7)
Eosinophils Relative: 2 % (ref 0–5)
HCT: 37 % (ref 36.0–46.0)
Lymphs Abs: 4.8 10*3/uL — ABNORMAL HIGH (ref 0.7–4.0)
MCH: 30 pg (ref 26.0–34.0)
MCV: 86.9 fL (ref 78.0–100.0)
Monocytes Absolute: 1.3 10*3/uL — ABNORMAL HIGH (ref 0.1–1.0)
Neutro Abs: 6.2 10*3/uL (ref 1.7–7.7)
Platelets: 521 10*3/uL — ABNORMAL HIGH (ref 150–400)
RBC: 4.26 MIL/uL (ref 3.87–5.11)
RDW: 14.6 % (ref 11.5–15.5)

## 2013-05-25 LAB — URINALYSIS, ROUTINE W REFLEX MICROSCOPIC
Bilirubin Urine: NEGATIVE
Glucose, UA: NEGATIVE mg/dL
Ketones, ur: NEGATIVE mg/dL
Leukocytes, UA: NEGATIVE
Nitrite: NEGATIVE
Protein, ur: >300 mg/dL — AB
Specific Gravity, Urine: 1.021 (ref 1.005–1.030)
Urobilinogen, UA: 0.2 mg/dL (ref 0.0–1.0)

## 2013-05-25 LAB — COMPREHENSIVE METABOLIC PANEL
AST: 24 U/L (ref 0–37)
BUN: 21 mg/dL (ref 6–23)
CO2: 22 mEq/L (ref 19–32)
Calcium: 8.8 mg/dL (ref 8.4–10.5)
Chloride: 105 mEq/L (ref 96–112)
Creatinine, Ser: 2.5 mg/dL — ABNORMAL HIGH (ref 0.50–1.10)
GFR calc Af Amer: 22 mL/min — ABNORMAL LOW (ref >90)
GFR calc non Af Amer: 19 mL/min — ABNORMAL LOW (ref >90)
Total Bilirubin: 0.2 mg/dL — ABNORMAL LOW (ref 0.3–1.2)
Total Protein: 7.5 g/dL (ref 6.0–8.3)

## 2013-05-25 LAB — HIV ANTIBODY (ROUTINE TESTING W REFLEX): HIV: NONREACTIVE

## 2013-05-25 MED ORDER — MORPHINE SULFATE 4 MG/ML IJ SOLN
4.0000 mg | Freq: Once | INTRAMUSCULAR | Status: AC
  Administered 2013-05-26: 4 mg via INTRAVENOUS
  Filled 2013-05-25: qty 1

## 2013-05-25 MED ORDER — SODIUM CHLORIDE 0.9 % IV BOLUS (SEPSIS)
1000.0000 mL | Freq: Once | INTRAVENOUS | Status: AC
  Administered 2013-05-26: 1000 mL via INTRAVENOUS

## 2013-05-25 MED ORDER — SODIUM CHLORIDE 0.9 % IV SOLN
Freq: Once | INTRAVENOUS | Status: AC
  Administered 2013-05-26: via INTRAVENOUS

## 2013-05-25 NOTE — Telephone Encounter (Signed)
Attempted to call patient. Unable to reach her by phone. [employer] customer unavailable is the message.   Called to discuss labs and f/u symptoms.  Patient has scheduled f/u with PCP next Monday, but I would really like to check in with her sooner via phone or office visit.   Negative HIV, Normal sodium Cr on the rise, despite holding ACEi, BP was elevated yesterday but not severely.  Proteinuria, hematuria and granular cast noted on UA/micro, concerning for ATN.  UCX pending.  Elevated Plt, suspect reactive thrombocytosis given recent GI symptoms, chills and elevated WBC.   Plan: Get in touch with patient to f/u symptoms (diarrhea, urine output) may require home visit tomorrow if we cannot reach her via phone today.  CT abdomen F/u UCx.  Repeat Cr on 12/18 or 12/19.

## 2013-05-25 NOTE — ED Notes (Signed)
Pt here with her PCP c/o abd pain with N/V/D x 1 week; per PCP pt seen in her office yesterday and had labs showing acute renal injury, proteinuria and hematuria

## 2013-05-25 NOTE — ED Notes (Signed)
Pt b/p is elevated, pt has not taken lisonpril since the 7th,

## 2013-05-26 ENCOUNTER — Encounter (HOSPITAL_COMMUNITY): Payer: Self-pay | Admitting: Radiology

## 2013-05-26 ENCOUNTER — Emergency Department (HOSPITAL_COMMUNITY): Payer: Self-pay

## 2013-05-26 DIAGNOSIS — R109 Unspecified abdominal pain: Secondary | ICD-10-CM | POA: Diagnosis present

## 2013-05-26 DIAGNOSIS — F17200 Nicotine dependence, unspecified, uncomplicated: Secondary | ICD-10-CM

## 2013-05-26 DIAGNOSIS — E785 Hyperlipidemia, unspecified: Secondary | ICD-10-CM

## 2013-05-26 DIAGNOSIS — I10 Essential (primary) hypertension: Secondary | ICD-10-CM

## 2013-05-26 DIAGNOSIS — F1410 Cocaine abuse, uncomplicated: Secondary | ICD-10-CM

## 2013-05-26 HISTORY — DX: Cocaine abuse, uncomplicated: F14.10

## 2013-05-26 LAB — SODIUM, URINE, RANDOM: Sodium, Ur: 39 mEq/L

## 2013-05-26 LAB — CBC
Hemoglobin: 11.3 g/dL — ABNORMAL LOW (ref 12.0–15.0)
MCHC: 33.8 g/dL (ref 30.0–36.0)
Platelets: 456 10*3/uL — ABNORMAL HIGH (ref 150–400)
RDW: 14.9 % (ref 11.5–15.5)

## 2013-05-26 LAB — URINE CULTURE: Colony Count: NO GROWTH

## 2013-05-26 LAB — BASIC METABOLIC PANEL
Calcium: 8.5 mg/dL (ref 8.4–10.5)
Chloride: 107 mEq/L (ref 96–112)
GFR calc Af Amer: 27 mL/min — ABNORMAL LOW (ref >90)
GFR calc non Af Amer: 23 mL/min — ABNORMAL LOW (ref >90)
Glucose, Bld: 126 mg/dL — ABNORMAL HIGH (ref 70–99)
Potassium: 4 mEq/L (ref 3.5–5.1)
Sodium: 138 mEq/L (ref 135–145)

## 2013-05-26 LAB — RAPID URINE DRUG SCREEN, HOSP PERFORMED
Barbiturates: NOT DETECTED
Cocaine: POSITIVE — AB
Tetrahydrocannabinol: POSITIVE — AB

## 2013-05-26 LAB — CREATININE, URINE, RANDOM: Creatinine, Urine: 83.44 mg/dL

## 2013-05-26 LAB — LACTIC ACID, PLASMA: Lactic Acid, Venous: 0.6 mmol/L (ref 0.5–2.2)

## 2013-05-26 LAB — TROPONIN I: Troponin I: <0.3 ng/mL (ref <0.30)

## 2013-05-26 MED ORDER — INFLUENZA VAC SPLIT QUAD 0.5 ML IM SUSP
0.5000 mL | INTRAMUSCULAR | Status: AC
  Administered 2013-05-27: 0.5 mL via INTRAMUSCULAR
  Filled 2013-05-26: qty 0.5

## 2013-05-26 MED ORDER — HYDROMORPHONE HCL PF 1 MG/ML IJ SOLN
1.0000 mg | Freq: Once | INTRAMUSCULAR | Status: AC
Start: 2013-05-26 — End: 2013-05-26
  Administered 2013-05-26: 1 mg via INTRAMUSCULAR

## 2013-05-26 MED ORDER — HEPARIN SODIUM (PORCINE) 5000 UNIT/ML IJ SOLN
5000.0000 [IU] | Freq: Three times a day (TID) | INTRAMUSCULAR | Status: DC
  Administered 2013-05-26 – 2013-05-30 (×10): 5000 [IU] via SUBCUTANEOUS
  Filled 2013-05-26 (×15): qty 1

## 2013-05-26 MED ORDER — HYDROMORPHONE HCL PF 1 MG/ML IJ SOLN
1.0000 mg | Freq: Once | INTRAMUSCULAR | Status: DC
  Filled 2013-05-26: qty 1

## 2013-05-26 MED ORDER — CIPROFLOXACIN HCL 500 MG PO TABS
500.0000 mg | ORAL_TABLET | Freq: Once | ORAL | Status: AC
  Administered 2013-05-26: 500 mg via ORAL
  Filled 2013-05-26: qty 1

## 2013-05-26 MED ORDER — RANITIDINE HCL 150 MG PO TABS: 150.0000 mg | ORAL_TABLET | Freq: Two times a day (BID) | ORAL | Status: DC

## 2013-05-26 MED ORDER — ACETAMINOPHEN 325 MG PO TABS: 650.0000 mg | ORAL_TABLET | Freq: Four times a day (QID) | ORAL | Status: DC | PRN

## 2013-05-26 MED ORDER — MORPHINE SULFATE 2 MG/ML IJ SOLN
2.0000 mg | INTRAMUSCULAR | Status: DC | PRN
  Administered 2013-05-26 – 2013-05-28 (×18): 2 mg via INTRAVENOUS
  Filled 2013-05-26 (×19): qty 1

## 2013-05-26 MED ORDER — PANTOPRAZOLE SODIUM 40 MG PO TBEC
40.0000 mg | DELAYED_RELEASE_TABLET | Freq: Two times a day (BID) | ORAL | Status: DC
  Administered 2013-05-26 – 2013-05-30 (×7): 40 mg via ORAL
  Filled 2013-05-26 (×8): qty 1

## 2013-05-26 MED ORDER — AMLODIPINE BESYLATE 10 MG PO TABS
10.0000 mg | ORAL_TABLET | Freq: Every day | ORAL | Status: DC
  Administered 2013-05-26 – 2013-05-30 (×5): 10 mg via ORAL
  Filled 2013-05-26 (×6): qty 1

## 2013-05-26 MED ORDER — ONDANSETRON HCL 4 MG/2ML IJ SOLN
4.0000 mg | Freq: Four times a day (QID) | INTRAMUSCULAR | Status: DC | PRN
  Administered 2013-05-26: 4 mg via INTRAVENOUS
  Filled 2013-05-26: qty 2

## 2013-05-26 MED ORDER — HYDROCODONE-ACETAMINOPHEN 5-325 MG PO TABS: 1.0000 | ORAL_TABLET | Freq: Four times a day (QID) | ORAL | Status: DC | PRN

## 2013-05-26 MED ORDER — CIPROFLOXACIN HCL 500 MG PO TABS: 500.0000 mg | ORAL_TABLET | Freq: Two times a day (BID) | ORAL | Status: DC

## 2013-05-26 MED ORDER — METRONIDAZOLE 500 MG PO TABS
500.0000 mg | ORAL_TABLET | Freq: Once | ORAL | Status: AC
  Administered 2013-05-26: 500 mg via ORAL
  Filled 2013-05-26: qty 1

## 2013-05-26 MED ORDER — ONDANSETRON HCL 4 MG PO TABS: 4.0000 mg | ORAL_TABLET | Freq: Four times a day (QID) | ORAL | Status: DC | PRN

## 2013-05-26 MED ORDER — SUCRALFATE 1 GM/10ML PO SUSP
1.0000 g | Freq: Three times a day (TID) | ORAL | Status: DC
  Administered 2013-05-26 – 2013-05-30 (×15): 1 g via ORAL
  Filled 2013-05-26 (×19): qty 10

## 2013-05-26 MED ORDER — ONDANSETRON 4 MG PO TBDP
8.0000 mg | ORAL_TABLET | Freq: Once | ORAL | Status: AC
  Administered 2013-05-26: 8 mg via ORAL
  Filled 2013-05-26: qty 2

## 2013-05-26 MED ORDER — SODIUM CHLORIDE 0.45 % IV SOLN
INTRAVENOUS | Status: DC
  Administered 2013-05-26: 125 mL/h via INTRAVENOUS
  Administered 2013-05-28 – 2013-05-30 (×5): via INTRAVENOUS

## 2013-05-26 MED ORDER — METRONIDAZOLE 500 MG PO TABS: 500.0000 mg | ORAL_TABLET | Freq: Two times a day (BID) | ORAL | Status: DC

## 2013-05-26 NOTE — Progress Notes (Signed)
Called for report, RN unavailable d/t shift change. Will call back

## 2013-05-26 NOTE — ED Notes (Signed)
Admitting MD at bedside.

## 2013-05-26 NOTE — ED Notes (Signed)
Assisted patient in use of bedpan

## 2013-05-26 NOTE — Progress Notes (Signed)
  PCP progress note   I stopped by to see Mrs. Finken in follow up as I was her PCP before she was reassigned to Dr. Dianah Field on 12/2012 and I have been involved in her acute care needs since she saw me in follow up in the office on 05/24/13. Prior to this I had not seen her since 05/2012. She reports improved abdominal pain and increased appetite. No vomiting or diarrhea.   She endorses L leg pain, lateral calf pain. Worse with walking. Sharp and achy. No injury. No swelling. Had LL back and L hip pain in the office. This has resolved.   Positive UDS for opiods ( I prescribed vicodin on 05/24/13 for her abdominal pain), cocaine and marijuana. She admits to use. She reports last marijuana use as recently and that her household members smoke. She reports last cocaine use at one month ago. She reports not admitting to use previously because cocaine use was remote. She reports that she plans no further use. We discussed the risk of cocaine use including MI, stroke, GI ulcer, bowel ischemia, kidney injury. We discussed the risk of marijuana including abdominal pain, nausea, vomiting. She is amenable to speaking with the Education officer, museum. Consult already in for lack insurance, edited to include polysubstance abuse.   BP 162/91  Pulse 62  Temp(Src) 97.7 F (36.5 C) (Oral)  Resp 18  SpO2 100% General appearance: alert, cooperative and no distress Abdomen: soft, non-tender; bowel sounds normal; no masses,  no organomegaly , mild TTP in the epigastric area  Extremities: no edema. mild TTP l lateral calf. no skin changes.   A/P:  Improving abdominal pain. Work up reveals marijuana and cocaine both of which could contribute to pain, especially cocaine. Strongly counseled against further use. SW consult. Will offer support as much as needed. She would benefit from outpatient behavioral health if she was willing, possibly [facility].   AKI: improving with fluids. F/u repeat Cr in AM.  Appears prerenal.   LLE pain: appears MSK. No edema to suggest DVT, but doppler ordered to rule out. Anticipate improvement with fluids. Ordered heat and tylenol.   Dispo:  Home tomorrow pending continued clinical improvement, SW consult, negative LE venous doppler.   May require medication assistance vs conversation regarding the importance of medication compliance (amlodipine for HTN).  Has left over vicodin at home from rx given on 05/24/13 20 tabs, shortterm, no refills, would not send home with narcotic. Patient is not a good candidate for narcotic use outside of serious injury given recent substance abuse history. Patient may be a good candidate for SNRI, although there is abuse potential with SNRI as well. This is an outpatient issue.   I appreciate the exceptional care provided by the family medicine teaching service. I will continue to follow and will be available for questions. I will be happy to see patient in f/u, however she does alread have an appt with her PCP on Monday 05/30/2013.

## 2013-05-26 NOTE — ED Notes (Signed)
Reported by night shift nurse IV was infiltrated & discontinued.

## 2013-05-26 NOTE — Telephone Encounter (Signed)
Pt is in hospital and has been notified by admitting provider. Jazmin Hartsell,CMA

## 2013-05-26 NOTE — ED Notes (Signed)
Report received, assumed care.  

## 2013-05-26 NOTE — H&P (Signed)
[facility] Attending Admit Note Patient seen and examined by me, discussed with resident team and I agree with admitting resident Dr Landry Corporal note for today.  Patient reports feeling somewhat better with regard to her abdominal pain, likely from analgesic given in ED.  No recent vomiting, but has had marked anorexia recently.  Suspect her AKI is pre-renal secondary to poor oral intake.  She appears dry on exam.  Her abdomen is soft and nontender, audible bowel sounds heard, no guarding or rigidity. I agree with admission and IV rehydration; PPI and sucralfate; follow her renal function as well as Hgb/Hct and serial abdominal exams.  Unlikely to represent mesenteric ischemia.  Dalbert Mayotte, MD

## 2013-05-26 NOTE — ED Provider Notes (Signed)
CSN: [id number]     Arrival date & time 05/25/13  1835 History   First MD Initiated Contact with Patient 05/25/13 2138     Chief Complaint  Patient presents with  . Abdominal Pain  . Emesis  . Diarrhea  . Abnormal Lab   (Consider location/radiation/quality/duration/timing/severity/associated sxs/prior Treatment) HPI Comments: Pt comes in with cc of abd pain, n/v/d. Pt has hx of PUD, Pancreatitis. Sx started a week ago, and are persistent. Emesis and BM x 1 over the past 24 hours, non bloody. Pt was seen at PCP, noted to have abd pain, abd CT scan ordered. She also was noted to have elevated Cr. She denies any dysuria, hematuria. Pain is diffuse, worse in the upper quadrants and is constant.   Patient is a 62 y.o. female presenting with abdominal pain, vomiting, and diarrhea. The history is provided by the patient and medical records.  Abdominal Pain Associated symptoms: diarrhea, nausea and vomiting   Associated symptoms: no chest pain, no chills, no constipation, no cough, no dysuria, no fatigue, no hematuria and no shortness of breath   Emesis Associated symptoms: abdominal pain and diarrhea   Associated symptoms: no chills   Diarrhea Associated symptoms: abdominal pain and vomiting   Associated symptoms: no chills     Past Medical History  Diagnosis Date  . Ulcer 1990    gastric ulcer. Ruptured s/p emergency repair  . Arthritis   . Pancreatitis 2000    resolved  . 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 Hyperlipidemia   . Hypertension   . Cervical radiculopathy 02/28/2011  . Acute kidney injury 05/24/2013   Past Surgical History  Procedure Laterality Date  . Abdominal hysterectomy  1979   Family History  Problem Relation Age of Onset  . Hypertension Father   . Cancer Father   . Hyperlipidemia Father   . Seizures Sister   . Early death Daughter   . Kidney disease Daughter     end stage dialysis dependent    History  Substance Use Topics  . Smoking status:  Current Every Day Smoker -- 0.30 packs/day    Types: Cigarettes  . Smokeless tobacco: Never Used  . Alcohol Use: 0.0 oz/week    0 Cans of beer per week     Comment: occ   OB History   Grav Para Term Preterm Abortions TAB SAB Ect Mult Living                 Review of Systems  Constitutional: Positive for activity change. Negative for chills and fatigue.  HENT: Negative for facial swelling.   Respiratory: Negative for cough, shortness of breath and wheezing.   Cardiovascular: Negative for chest pain.  Gastrointestinal: Positive for nausea, vomiting, abdominal pain and diarrhea. Negative for constipation, blood in stool and abdominal distention.  Genitourinary: Negative for dysuria, hematuria and difficulty urinating.  Musculoskeletal: Negative for neck pain.  Skin: Negative for color change.  Neurological: Negative for speech difficulty.  Hematological: Does not bruise/bleed easily.  Psychiatric/Behavioral: Negative for confusion.    Allergies  Aspirin and Ibuprofen  Home Medications   Current Outpatient Rx  Name  Route  Sig  Dispense  Refill  . acetaminophen (TYLENOL) 500 MG tablet   Oral   Take 1,000 mg by mouth daily as needed for mild pain.         Marland Kitchen amLODipine (NORVASC) 10 MG tablet   Oral   Take 1 tablet (10 mg total) by  mouth daily.   30 tablet   0   . HYDROcodone-acetaminophen (NORCO/VICODIN) 5-325 MG per tablet   Oral   Take 1 tablet by mouth every 6 (six) hours as needed for moderate pain or severe pain.   20 tablet   0   . lisinopril (PRINIVIL,ZESTRIL) 10 MG tablet   Oral   Take 10 mg by mouth daily.         Marland Kitchen loperamide (IMODIUM) 2 MG capsule   Oral   Take 1 capsule (2 mg total) by mouth as needed for diarrhea or loose stools.   30 capsule   0   . ondansetron (ZOFRAN ODT) 8 MG disintegrating tablet   Oral   Take 1 tablet (8 mg total) by mouth every 8 (eight) hours as needed for nausea or vomiting.   20 tablet   0   . pantoprazole  (PROTONIX) 40 MG tablet   Oral   Take 1 tablet (40 mg total) by mouth 2 (two) times daily before a meal.   60 tablet   3   . traMADol (ULTRAM) 50 MG tablet   Oral   Take 50 mg by mouth every 6 (six) hours as needed for moderate pain.         . ciprofloxacin (CIPRO) 500 MG tablet   Oral   Take 1 tablet (500 mg total) by mouth 2 (two) times daily.   20 tablet   0   . HYDROcodone-acetaminophen (NORCO/VICODIN) 5-325 MG per tablet   Oral   Take 1 tablet by mouth every 6 (six) hours as needed.   15 tablet   0   . metroNIDAZOLE (FLAGYL) 500 MG tablet   Oral   Take 1 tablet (500 mg total) by mouth 2 (two) times daily.   14 tablet   0   . ranitidine (ZANTAC) 150 MG tablet   Oral   Take 1 tablet (150 mg total) by mouth 2 (two) times daily.   60 tablet   0    BP 158/68  Pulse 61  Temp(Src) 98.8 F (37.1 C) (Oral)  Resp 16  SpO2 99% Physical Exam  Nursing note and vitals reviewed. Constitutional: She is oriented to person, place, and time. She appears well-developed and well-nourished.  HENT:  Head: Normocephalic and atraumatic.  Eyes: EOM are normal. Pupils are equal, round, and reactive to light.  Neck: Neck supple. No JVD present.  Cardiovascular: Normal rate, regular rhythm and normal heart sounds.   No murmur heard. Pulmonary/Chest: Effort normal. No respiratory distress.  Abdominal: Soft. She exhibits no distension. There is tenderness. There is no rebound and no guarding.  Diffuse tenderness, worst over the epigastric region, with some voluntary guarding.  Neurological: She is alert and oriented to person, place, and time.  Skin: Skin is warm and dry.    ED Course  Procedures (including critical care time) Labs Review Labs Reviewed  CBC WITH DIFFERENTIAL - Abnormal; Notable for the following:    WBC 12.6 (*)    Platelets 521 (*)    Lymphs Abs 4.8 (*)    Monocytes Absolute 1.3 (*)    All other components within normal limits  COMPREHENSIVE METABOLIC  PANEL - Abnormal; Notable for the following:    Creatinine, Ser 2.50 (*)    Albumin 2.9 (*)    Total Bilirubin 0.2 (*)    GFR calc non Af Amer 19 (*)    GFR calc Af Amer 22 (*)    All other components within  normal limits  URINALYSIS, ROUTINE W REFLEX MICROSCOPIC - Abnormal; Notable for the following:    APPearance CLOUDY (*)    Hgb urine dipstick MODERATE (*)    Protein, ur >300 (*)    All other components within normal limits  URINE MICROSCOPIC-ADD ON - Abnormal; Notable for the following:    Squamous Epithelial / LPF MANY (*)    Bacteria, UA MANY (*)    Casts HYALINE CASTS (*)    All other components within normal limits  LIPASE, BLOOD - Abnormal; Notable for the following:    Lipase 62 (*)    All other components within normal limits  URINE CULTURE   Imaging Review Ct Abdomen Pelvis Wo Contrast  05/26/2013   CLINICAL DATA:  Abdominal pain, nausea, vomiting, and diarrhea for 1 week, abnormal urinalysis showing proteinuria and hematuria  EXAM: CT ABDOMEN AND PELVIS WITHOUT CONTRAST  TECHNIQUE: Multidetector CT imaging of the abdomen and pelvis was performed following the standard protocol without intravenous contrast. Sagittal and coronal MPR images reconstructed from axial data set.  COMPARISON:  04/27/2005  FINDINGS: Small posterior left diaphragmatic hernia containing fat.  Within limits of a nonenhanced exam no focal abnormalities of the liver, spleen, pancreas, or adrenal glands.  Kidneys appear somewhat enlarged bilaterally at with thickening of renal cortex since previous study.  No calcification hydronephrosis identified.  Small cyst at inferior pole right kidney 16 mm diameter image 32, unchanged.  Extensive atherosclerotic calcifications.  Normal appendix, retrocecal.  Stomach and bowel loops normal appearance.  Unremarkable ureters and bladder.  Uterus surgically absent.  No mass, adenopathy, free fluid or inflammatory process.  No acute osseous findings.  IMPRESSION: Small  right renal cyst.  Interval increase in size and cortical thickness of the kidneys bilaterally without gross mass or hydronephrosis.  This is nonspecific and be seen with medical renal disease, infection and other etiologies.  No other intra-abdominal or intrapelvic abnormalities identified.   Electronically Signed   By: Lavonia Dana M.D.   On: 05/26/2013 01:07    EKG Interpretation   None       MDM   1. PUD (peptic ulcer disease)   2. Abdominal pain    DDx includes: Pancreatitis Hepatobiliary pathology including cholecystitis Gastritis/PUD SBO ACS syndrome Aortic Dissection Colitis AAA Tumors Intra abdominal abscess Thrombosis Mesenteric ischemia Diverticulitis Peritonitis Appendicitis Hernia Nephrolithiasis Pyelonephritis UTI/Cystitis  Pt comes in with cc of abd pain, n/v/diarrhea. Pt has diffuse tenderness, worst over the epigastrium. CT non contrast is benign. Lipase is mildly elevated. CXR is clear. Labs show mild leukocytosis, and Cr of 2.5 - it was 1.8 just a few days ago. Pt is on nephrotoxins like Lisinopril.  I did repeat exams on the patient x 3 times -and she has persistent pain.  Although i think the epigastric pain, with lipase elevation is likely PU and i can get her GI f/u. Pt repeatedly states that she doesn't feel good, has poor pain control at home, is nauseated - with decreased po intake, and has AKI with Cr of 2.5  - and so looking at multiple active issues - i think it might be best to admit the patient.  FM will evaluate the patient independently and make the final plan. They are thinking about close follow ups and referral.  Varney Biles, MD 05/26/13 [phone]

## 2013-05-26 NOTE — H&P (Signed)
[facility] Admission History and Physical Service Pager: [phone]  Patient name: Ashley [name] Medical record number: [id number] Date of birth: August 09, 1950 Age: 62 y.o. Gender: female  Primary Care Provider: Conni Slipper, MD Consultants: None Code Status: Full  Chief Complaint: Abdminal pain and AKI  Assessment and Plan: Ashley [name] is a 62 y.o. female presenting with abdominal pain and AKI. PMH is significant for PUD, pancreatitis, hepatitis C, HLD and HTN.  # Abdominal pain - intermittent and sharp with h/o PUD. CT abdomen with no acute findings including mesenteric ischemia or renal calculus. Lipse 62. Could see thi swith PUD. Recent workup in clinic 12/16 with normal AST, ALT, and bilirubin. Afebrile with stable vitals.  - Admit to [facility] Medicine Teaching Service, attending Dr. Lindell Noe, to inpatient. - IV hydration. - Diet clear liquids. Decrease to NPO if pain intensifies. - Continue home PPI. - Added PO carafate. - Morphine 2mg  q2 hours IV prn pain. Zofran prn nausea. - Check lactic acid. - FOBT given report of dark stools and h/o PUD. 12/16 negative. If positive on today's check, hold SQ heparin and consult GI for endoscopy. - Serial abdominal exams to evaluate for evolving process/mesenteric ischemia. If early, may have been missed by CT abdomen. - UDS to evaluate for marijuana which can cause cyclic vomiting. - Will not continue cipro/flagyl as no sign of colitis or diverticulitis on CT abd.  # Kidney injury - Uncertain if acute or acute on chronic. Last normal Cr was 02/2011 at 0.91. After that, next value 05/15/13 is 1.8 and then 12/16 was 2.53. Most likely prerenal given reported decreased PO intake. Findings of medical renal disease with renal cortical thickness increase on CT abdomen, and small right renal cyst. - holding lisinopril. - IV hydration. - AM BMET. - Calculate FeNa.   # HTN - Continue norvasc. - Hold lisinopril.  #  Chest pain - Possibly related to PUD, reproducible on exam, unlikely cardiovascular. - EKG - Trop x 2  # Leg pain left - chronic per pt, no signs of DVT or fracture, pt able to ambulate, Atypical, moved for past 2 mo down left hip to back of left leg. Occasionally gives out. - Monitor  # Right flank pain - UA with >300 protein and moderate Hgb, many bacteria, 7-10 WBC but many squamous cells. 12/16 urine culture no growth. - f/u ucx from 12/17 collection  - Will hold off on abx at this time and treat if any signs of infection or dysuria.  # Mild leukocytosis - Check CBC tomorrow  # Weight loss-  Will need to monitor and f/u as an outpatient.  FEN/GI: clear liq diet, 1/2 ns at 125cc/hr Prophylaxis: sq heparin - d/c and consult GI if FOBT positive.  Disposition: Admitting to inpatient pending further evaluation of kidney injury and unremitting abdominal pain. - SW consulted due to lack of insurance  History of Present Illness: Ashley [name] is a 62 y.o. female presenting with intractable abdominal pain and elevated creatinine with h/o gastric ulcer, hep C, HLD, HTN, tobacco abuse, and back pain. She reports 2 weeks ago having emesis and diarrhea, which then progressed to abdominal pain mid-abdomen. She denies h/o similar pain. Pain was 10/10 and made her double over. Since that time, pain has not changed. Last night was "miserable". Pain is sharp, comes and goes. Emesis and diarrhea resolved but she is still intermittently feeling nauseated. She has not had diarrhea since yesterday morning and has not vomited  since 2-3 days ago. Diarrhea was dark. Emesis was whitish. She had been around a child with pneumonia. Reports chills. Has not been eating and drinking like normal for 2 months because of lowered appetite. Thinks she has lost weight. Was 156lbs "some months back" and now is 149 lbs.   Feels weak and has substernal left chest pain yesterday that comes and goes that is sharp and does not  feel like her typical ulcer pain. Reports some shortness of breath.  Also reports pain that started in hip and now is above ankle and makes her limp. Was giving out on her. [place] on it 1 mo ago and it has hurt since then. Denies redness or swelling.   Denies rash, new cough, sneeze, vision or hearing changes.  Denies new medications over past 1 month. Has not taken ibuprofen, aleve, bc powder because of ulcer.  In the ED: Got dilaudid and pain went 6 to 8. Dosed cipri and flagyl x 1, dilaudid 1mg  IM x 1, morphine 4mg  x 1 at 0007, zofran ODT 8mg  x 1, and 1L NS bolus.  Review Of Systems: Per HPI with the following additions: Otherwise 12 point review of systems was performed and was unremarkable.  Patient Active Problem List   Diagnosis Date Noted  . Acute kidney injury 05/24/2013  . Periumbilical abdominal pain 05/24/2013  . Abdominal pain 05/24/2013  . Fall 08/01/2011  . Cervical radiculopathy 02/28/2011  . Rotator cuff syndrome of right shoulder 02/28/2011  . Back pain 01/10/2011  . Hepatitis C   . Hyperlipidemia   . Hypertension   . TOBACCO ABUSE 12/24/2009  . HYPERLIPIDEMIA 07/18/2009  . MUSCULOSKELETAL PAIN 07/18/2009  . GASTRIC ULCER 11/13/2008  . CONSTIPATION 11/13/2008   Past Medical History: Past Medical History  Diagnosis Date  . Ulcer 1990    gastric ulcer. Ruptured s/p emergency repair  . Arthritis   . Pancreatitis 2000    resolved  . 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 Hyperlipidemia   . Hypertension   . Cervical radiculopathy 02/28/2011  . Acute kidney injury 05/24/2013   Past Surgical History: Past Surgical History  Procedure Laterality Date  . Abdominal hysterectomy  1979  H/o D&C  Social History: History  Substance Use Topics  . Smoking status: Current Every Day Smoker -- 0.30 packs/day    Types: Cigarettes  . Smokeless tobacco: Never Used  . Alcohol Use: 0.0 oz/week    0 Cans of beer per week     Comment: occ   Lives with daughter and  sister off [place] in house.  No insurance. Waiting for medicare to kick in.  Tobacco: 7 cig/day, but since sick has not been smoking any in the last couple weeks. Drugs: None Alcohol: None  Additional social history:  Please also refer to relevant sections of EMR.  Family History: Family History  Problem Relation Age of Onset  . Hypertension Father   . Cancer Father   . Hyperlipidemia Father   . Seizures Sister   . Early death Daughter   . Kidney disease Daughter     end stage dialysis dependent    Allergies and Medications: Allergies  Allergen Reactions  . Aspirin Other (See Comments)    Stomach ache  . Ibuprofen Other (See Comments)    Stomach ache   No current facility-administered medications on file prior to encounter.   Current Outpatient Prescriptions on File Prior to Encounter  Medication Sig Dispense Refill  .  acetaminophen (TYLENOL) 500 MG tablet Take 1,000 mg by mouth daily as needed for mild pain.      Marland Kitchen amLODipine (NORVASC) 10 MG tablet Take 1 tablet (10 mg total) by mouth daily.  30 tablet  0  . HYDROcodone-acetaminophen (NORCO/VICODIN) 5-325 MG per tablet Take 1 tablet by mouth every 6 (six) hours as needed for moderate pain or severe pain.  20 tablet  0  . lisinopril (PRINIVIL,ZESTRIL) 10 MG tablet Take 10 mg by mouth daily.      Marland Kitchen loperamide (IMODIUM) 2 MG capsule Take 1 capsule (2 mg total) by mouth as needed for diarrhea or loose stools.  30 capsule  0  . ondansetron (ZOFRAN ODT) 8 MG disintegrating tablet Take 1 tablet (8 mg total) by mouth every 8 (eight) hours as needed for nausea or vomiting.  20 tablet  0  . pantoprazole (PROTONIX) 40 MG tablet Take 1 tablet (40 mg total) by mouth 2 (two) times daily before a meal.  60 tablet  3  . traMADol (ULTRAM) 50 MG tablet Take 50 mg by mouth every 6 (six) hours as needed for moderate pain.        Objective: BP 158/68  Pulse 61  Temp(Src) 98.8 F (37.1 C) (Oral)  Resp 16  SpO2 99% Exam: GEN:  NAD, pleasant HEENT: Atraumatic, normocephalic, neck supple, EOMI, sclera clear, pupils 29mm round and reactive. CV: RRR, no murmurs, rubs, or gallops, 2+ bilateral DP pulses, no appreciable JVD PULM: CTAB, normal effort ABD: Soft, generalized tenderness worse epigastrically, nondistended, NABS SKIN: No rash or cyanosis; warm and well-perfused EXTR: No lower extremity edema or calf tenderness; left posterior leg tenderness 4cm above ankle, no erythema or swelling, no significant left hip pain. PSYCH: Mood and affect euthymic, normal rate and volume of speech NEURO: Awake, alert, no focal deficits grossly, normal speech, gait mildly antalgic BACK: Right flank pain mild  Labs and Imaging: CBC BMET   Recent Labs Lab 05/25/13 2105  WBC 12.6*  HGB 12.8  HCT 37.0  PLT 521*    Recent Labs Lab 05/25/13 2105  NA 139  K 3.9  CL 105  CO2 22  BUN 21  CREATININE 2.50*  GLUCOSE 94  CALCIUM 8.8     CT abdomen/pelvis without contrast: IMPRESSION:  Small right renal cyst.  Interval increase in size and cortical thickness of the kidneys  bilaterally without gross mass or hydronephrosis.  This is nonspecific and be seen with medical renal disease,  infection and other etiologies.  No other intra-abdominal or intrapelvic abnormalities identified.  Electronically Signed  By: Lavonia Dana M.D.  On: 05/26/2013 01:07  UA:  Urinalysis    Component Value Date/Time   COLORURINE YELLOW 05/25/2013 2247   APPEARANCEUR CLOUDY* 05/25/2013 2247   LABSPEC 1.021 05/25/2013 2247   PHURINE 5.0 05/25/2013 2247   GLUCOSEU NEGATIVE 05/25/2013 2247   HGBUR MODERATE* 05/25/2013 2247   BILIRUBINUR NEGATIVE 05/25/2013 2247   BILIRUBINUR NEG 05/24/2013 [place] 05/25/2013 2247   PROTEINUR >300* 05/25/2013 2247   UROBILINOGEN 0.2 05/25/2013 2247   UROBILINOGEN 0.2 05/24/2013 1639   NITRITE NEGATIVE 05/25/2013 2247   NITRITE NEG 05/24/2013 1639   LEUKOCYTESUR NEGATIVE 05/25/2013 2247         Hilton Sinclair, MD 05/26/2013, 8:39 AM PGY-2, [place] Intern pager: [phone], text pages welcome

## 2013-05-27 DIAGNOSIS — M79609 Pain in unspecified limb: Secondary | ICD-10-CM

## 2013-05-27 DIAGNOSIS — K5900 Constipation, unspecified: Secondary | ICD-10-CM

## 2013-05-27 LAB — URINE CULTURE

## 2013-05-27 LAB — BASIC METABOLIC PANEL
Chloride: 114 mEq/L — ABNORMAL HIGH (ref 96–112)
GFR calc Af Amer: 28 mL/min — ABNORMAL LOW (ref >90)
GFR calc non Af Amer: 24 mL/min — ABNORMAL LOW (ref >90)
Potassium: 4.2 mEq/L (ref 3.5–5.1)

## 2013-05-27 LAB — CBC
HCT: 32.4 % — ABNORMAL LOW (ref 36.0–46.0)
Hemoglobin: 10.7 g/dL — ABNORMAL LOW (ref 12.0–15.0)
MCHC: 33 g/dL (ref 30.0–36.0)
RDW: 14.9 % (ref 11.5–15.5)
WBC: 9.7 10*3/uL (ref 4.0–10.5)

## 2013-05-27 MED ORDER — BOOST / RESOURCE BREEZE PO LIQD
1.0000 | Freq: Two times a day (BID) | ORAL | Status: DC
  Administered 2013-05-27 – 2013-05-30 (×5): 1 via ORAL

## 2013-05-27 MED ORDER — POLYETHYLENE GLYCOL 3350 17 G PO PACK
34.0000 g | PACK | Freq: Two times a day (BID) | ORAL | Status: DC
  Administered 2013-05-27 – 2013-05-28 (×2): 34 g via ORAL
  Filled 2013-05-27 (×7): qty 2

## 2013-05-27 MED ORDER — POLYETHYLENE GLYCOL 3350 17 G PO PACK
17.0000 g | PACK | Freq: Two times a day (BID) | ORAL | Status: DC
Start: 2013-05-27 — End: 2013-05-27
  Administered 2013-05-27: 17 g via ORAL
  Filled 2013-05-27 (×2): qty 1

## 2013-05-27 MED ORDER — GLYCERIN (LAXATIVE) 2.1 G RE SUPP
1.0000 | Freq: Once | RECTAL | Status: AC
  Administered 2013-05-27: 1 via RECTAL
  Filled 2013-05-27: qty 1

## 2013-05-27 NOTE — Progress Notes (Signed)
INITIAL NUTRITION ASSESSMENT  DOCUMENTATION CODES Per approved criteria  -Non-severe (moderate) malnutrition in the context of chronic illness   INTERVENTION: Add Resource Breeze po BID, each supplement provides 250 kcal and 9 grams of protein RD to continue to follow nutrition care plan.  NUTRITION DIAGNOSIS: Inadequate oral intake related to abdominal pain and poor appetite as evidenced by pt report.   Goal: Intake to meet >90% of estimated nutrition needs.  Monitor:  weight trends, lab trends, I/O's, PO intake, supplement tolerance, diet advancement  Reason for Assessment: Malnutrition Screening Tool  62 y.o. female  Admitting Dx: AKI  ASSESSMENT: PMHx significant for PUD, pancreatitis, hep C, HLD, HTN and polysubstance abuse. Admitted with abdonimal pain, n/v/d x 2 weeks. Work-up reveals AKI. UDS is positive for opiods, cocaine and marijuana.  Pt reports slight weight loss from 156 - 149 lb, however this is not consistent with weight hx - as her weight hx shows that she usually weighs around 150 lb and that this has been stable. She reports very poor appetite x 2 months. She states that her appetite is so poor that she just goes 2-3 days without eating anything because she just doesn't get hungry (? Related to cocaine use?)  Pt with moderate muscle mass loss in calves. Rest of physical exam didn't reveal fat or muscle mass loss.  Pt meets criteria for moderate MALNUTRITION in the context of chronic illness as evidenced by moderate muscle mass loss and intake of <75% x at least 1 month.  Pt is currently on clear liquids and tolerating well. States that she hasn't eaten this much in "months."  Height: Ht Readings from Last 1 Encounters:  05/26/13 5\' 3"  (1.6 m)    Weight: Wt Readings from Last 1 Encounters:  05/26/13 151 lb 4.8 oz (68.629 kg)    Ideal Body Weight: 115 lb  % Ideal Body Weight: 131%  Wt Readings from Last 10 Encounters:  05/26/13 151 lb 4.8 oz  (68.629 kg)  05/24/13 149 lb (67.586 kg)  05/16/13 151 lb (68.493 kg)  05/15/13 149 lb (67.586 kg)  10/14/12 150 lb (68.04 kg)  05/19/12 153 lb (69.4 kg)  04/13/12 151 lb (68.493 kg)  08/01/11 166 lb (75.297 kg)  02/27/11 166 lb (75.297 kg)  02/26/11 166 lb (75.297 kg)    Usual Body Weight: 150 lb  % Usual Body Weight: 100%  BMI:  Body mass index is 26.81 kg/(m^2). Overweight  Estimated Nutritional Needs: Kcal: 1600 - 1800 Protein: 65 - 75 g Fluid: 1.6 - 1.8 liters  Skin: intact  Diet Order: Clear Liquid  EDUCATION NEEDS: -No education needs identified at this time   Intake/Output Summary (Last 24 hours) at 05/27/13 1243 Last data filed at 05/27/13 0950  Gross per 24 hour  Intake   1460 ml  Output    475 ml  Net    985 ml    Last BM: 12/16  Labs:   Recent Labs Lab 05/25/13 2105 05/26/13 1158 05/27/13 0525  NA 139 138 144  K 3.9 4.0 4.2  CL 105 107 114*  CO2 22 22 22   BUN 21 16 12   CREATININE 2.50* 2.15* 2.07*  CALCIUM 8.8 8.5 8.1*  GLUCOSE 94 126* 87    CBG (last 3)  No results found for this basename: GLUCAP,  in the last 72 hours  Scheduled Meds: . amLODipine  10 mg Oral Daily  . heparin  5,000 Units Subcutaneous Q8H  . pantoprazole  40 mg Oral BID  AC  . sucralfate  1 g Oral TID WC & HS    Continuous Infusions: . sodium chloride 125 mL/hr (05/26/13 1255)    Past Medical History  Diagnosis Date  . Ulcer 1990    gastric ulcer. Ruptured s/p emergency repair  . Arthritis   . Pancreatitis 2000    resolved  . 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 Hyperlipidemia   . Hypertension   . Cervical radiculopathy 02/28/2011  . Acute kidney injury 05/24/2013  . Cocaine substance abuse 05/26/2013    positive UDS   . Marijuana abuse 12/18/204    positive UDS, family members smoke as well    Past Surgical History  Procedure Laterality Date  . Abdominal hysterectomy  1979  . Repair of perforated ulcer      Inda Coke MS, RD,  LDN Pager: [phone] After-hours pager: [phone]

## 2013-05-27 NOTE — Evaluation (Signed)
Occupational Therapy Evaluation Patient Details Name: Ashley [name] MRN: [id number] DOB: 02/11/1951 Today's Date: 05/27/2013 Time: [id number] OT Time Calculation (min): 15 min  OT Assessment / Plan / Recommendation History of present illness Pt is a 62 y.o. female adm due to abdominal pain, weakness.Pt with a PMH of polysubstance abuse.     Clinical Impression   Pt was admitted with abdominal pain.  At home, she is independent with adls and helps care for her sister.  Pt hurt leg a couple of months ago and limped during eval.  Recommend skilled OT to increase safety and independence with adls with supervision level goals in acute.      OT Assessment  Patient needs continued OT Services    Follow Up Recommendations   (if pt has HHPT, then recommend HHOT for iadls)    Barriers to Discharge      Equipment Recommendations   (pt will consider tub seat/bench)    Recommendations for Other Services    Frequency  Min 2X/week    Precautions / Restrictions Precautions Precautions: Fall Precaution Comments: fell 2 months ago; reports that her Lt LE just "gives out"  Restrictions Weight Bearing Restrictions: No   Pertinent Vitals/Pain Leg and abdomen \  Not rated.  Repositioned.  Pt was premedicated    ADL  Grooming: Wash/dry hands;Supervision/safety Where Assessed - Grooming: Unsupported standing Upper Body Bathing: Set up Where Assessed - Upper Body Bathing: Unsupported sitting Lower Body Bathing: Supervision/safety Where Assessed - Lower Body Bathing: Supported sit to stand Upper Body Dressing: Set up Where Assessed - Upper Body Dressing: Unsupported sitting Lower Body Dressing: Set up Where Assessed - Lower Body Dressing: Supported sit to stand Toilet Transfer: Magazine features editor Method: Sit to Loss adjuster, chartered: Regular height toilet Toileting - Water quality scientist and Hygiene: Supervision/safety Where Assessed - Best boy and  Hygiene: Sit to stand from 3-in-1 or toilet Transfers/Ambulation Related to ADLs: ambulated to bathroom.  Pt tends to reach for objects.  States her knee sometimes gives.  She doesn't want a walker but will think about using a cane. 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 She states she has limped since she helped her sister get up from floor 2 months ago.  She doesn't feel like she needs a 3:1 commode.  Recommended to her that she gets a seat or tub bench to use in shower:  she will think about this.  Also encouraged to call non-emergency fire for help if sister falls.  Sister recently came to live wtih her.  Daughter is at home wtih her but works.  Pt is able to cross legs for adls and steady standing, but limps with walking and states knee sometimes gives.    OT Diagnosis: Acute pain;Generalized weakness  OT Problem List: Decreased strength;Decreased activity tolerance;Pain;Decreased knowledge of use of DME or AE OT Treatment Interventions: Self-care/ADL training;DME and/or AE instruction;Patient/family education   OT Goals(Current goals can be found in the care plan section) Acute Rehab OT Goals Patient Stated Goal: get rid of pain OT Goal Formulation: With patient Time For Goal Achievement: 06/10/12 Potential to Achieve Goals: Good ADL Goals Pt Will Transfer to Toilet: with supervision;ambulating;regular height toilet Pt Will Perform Tub/Shower Transfer: with min guard assist;Tub transfer;shower seat;tub bench Additional ADL Goal #1: pt will gather clothes at supervision level, with AD as needed  Visit Information  Last OT Received On: 05/27/13 Assistance Needed: +1 History of Present  Illness: Pt is a 62 y.o. female adm due to abdominal pain, weakness.Pt with a PMH of polysubstance abuse.         Prior Functioning     Home Living Family/patient expects to be discharged to:: Private residence Living Arrangements: Children;Other  relatives Prior Function Level of Independence: Independent Communication Communication: No difficulties Dominant Hand: Right         Vision/Perception     Cognition  Cognition Arousal/Alertness: Awake/alert Behavior During Therapy: WFL for tasks assessed/performed Overall Cognitive Status: Within Functional Limits for tasks assessed    Extremity/Trunk Assessment Upper Extremity Assessment Upper Extremity Assessment: Overall WFL for tasks assessed (pt has soreness in R shoulder:  variable ROM; wfls with OT)     Mobility Bed Mobility Rolling Left: 5: Supervision Left Sidelying to Sit: 5: Supervision Transfers Sit to Stand: 5: Supervision;From toilet;From bed Stand to Sit: To bed;To toilet;5: Supervision Details for Transfer Assistance: min guard to ambulate; supervison for sit to stand     Exercise     Balance Static Standing Balance Static Standing - Balance Support: No upper extremity supported;During functional activity Static Standing - Level of Assistance: 5: Stand by assistance   End of Session OT - End of Session Activity Tolerance: Patient tolerated treatment well Patient left: in bed;with call bell/phone within reach  [place] 05/27/2013, 2:24 PM Lesle Chris, OTR/L [phone] 05/27/2013

## 2013-05-27 NOTE — Progress Notes (Signed)
Clinical Social Work Department BRIEF PSYCHOSOCIAL ASSESSMENT 05/27/2013  Patient:  Ashley [name], Ashley [name]     Account Number:  [id number]     Admit date:  05/25/2013  Clinical Social Worker:  Freeman Caldron  Date/Time:  05/27/2013 02:50 PM  Referred by:  Physician  Date Referred:  05/27/2013 Referred for  Substance Abuse   Other Referral:   Interview type:  Patient Other interview type:    PSYCHOSOCIAL DATA Living Status:  ALONE Admitted from facility:   Level of care:   Primary support name:  Robyne Peers [phone]) Primary support relationship to patient:  CHILD, ADULT Degree of support available:   Good--pt has daughter, Sharlotte Alamo.    CURRENT CONCERNS Current Concerns  Substance Abuse   Other Concerns:    SOCIAL WORK ASSESSMENT / PLAN Pt and CSW engaged in conversation about pt's substance use. Pt states she does not drink, but she has chronic back pain and uses marijuana and takes pills to self-medicate. CSW asked pt if she wants to discontinue this use, and pt states she does and never intended to start using substances to control her pain. Pt requested NA meeting schedule for [facility] and CSW provided this along with 24-hour helpline. CSW also provided list of inpatient and outpatient substance abuse treatment programs for [facility]. Pt thanked CSW for resources, and no additional needs identified.   Assessment/plan status:  No Further Intervention Required Other assessment/ plan:   Information/referral to community resources:   Resources list for inpatient & outpatient programs and NA meeting schedule provided    PATIENT'S/FAMILY'S RESPONSE TO PLAN OF CARE: Pt receptive to [place] conversation and engaged openly and honestly about substance use. Pt grateful for resources list for inpatient & outpatient resources and requested NA meeting schedule. CSW provided all of this. No additional CSW needs identified. CSW signing off.       Ky Barban, MSW,  [facility] Clinical Social Worker [phone]

## 2013-05-27 NOTE — Progress Notes (Signed)
Family Medicine Teaching Service Daily Progress Note Intern Pager: [phone]  Patient name: Ashley [name] Medical record number: [id number] Date of birth: [birth year] Age: 62 y.o. Gender: female  Primary Care Provider: Conni Slipper, MD Consultants: none Code Status: full  Pt Overview and Major Events to Date:  12/18 - Admitted with abdominal pain and AKI  Assessment and Plan: Ashley [name] is a 62 y.o. female presenting with abdominal pain and AKI. PMH is significant for PUD, pancreatitis, hepatitis C, HLD and HTN.   # Abdominal pain - intermittent and sharp with h/o PUD. CT abdomen with no acute findings including mesenteric ischemia or renal calculus. Lipse 62. Could see thi swith PUD. Recent workup in clinic 12/16 with normal AST, ALT, and bilirubin. Afebrile with stable vitals.   - IV hydration.  - Diet clear liquids. Decrease to NPO if pain intensifies.  - Continue home PPI.  - Added PO carafate.  - Morphine 2mg  q2 hours IV prn pain. Zofran prn nausea.  - lactic acid 0.6 - Serial abdominal exams to evaluate for evolving process/mesenteric ischemia. If early, may have been missed by CT abdomen.  - UDS positive for marijuana which can cause cyclic vomiting.  - Will not continue cipro/flagyl as no sign of colitis or diverticulitis on CT abd.  - no BM in 1 week, aggressive bowel regimen today  # Kidney injury - Uncertain if acute or acute on chronic. Last normal Cr was 02/2011 at 0.91. After that, next value 05/15/13 is 1.8 and then 12/16 was 2.53. Most likely prerenal given reported decreased PO intake. Findings of medical renal disease with renal cortical thickness increase on CT abdomen, and small right renal cyst.  - holding lisinopril.  - IV hydration.  - AM BMET.  - FeNa 0.8 - suggests prerenal - creat stable this am at 2.5  # HTN  - Continue norvasc.  - Hold lisinopril.   # Chest pain - Possibly related to PUD, reproducible on exam, unlikely cardiovascular.  - EKG   - Trop negative x 2   # Leg pain left - chronic per pt, no signs of DVT or fracture, pt able to ambulate, Atypical, moved for past 2 mo down left hip to back of left leg. Occasionally gives out.  - Monitor   # Right flank pain - UA with >300 protein and moderate Hgb, many bacteria, 7-10 WBC but many squamous cells. 12/16 urine culture no growth.  - f/u ucx from 12/17 collection  - Will hold off on abx at this time and treat if any signs of infection or dysuria.   # Mild leukocytosis - resolved  # Weight loss- Will need to monitor and f/u as an outpatient.   FEN/GI: clear liq diet, 1/2 ns at 125cc/hr  Prophylaxis: sq heparin - d/c and consult GI if FOBT positive.  Disposition: home pending clinical improvement  Subjective: stable pain this am, reports no BM in 1 week  Objective: Temp:  [97.5 F (36.4 C)-99 F (37.2 C)] 98.5 F (36.9 C) (12/19 0558) Pulse Rate:  [50-71] 66 (12/19 0558) Resp:  [14-18] 18 (12/19 0558) BP: (128-162)/(68-92) 135/78 mmHg (12/19 0558) SpO2:  [95 %-100 %] 99 % (12/19 0558) Weight:  [151 lb 4.8 oz (68.629 kg)] 151 lb 4.8 oz (68.629 kg) (12/18 2231) Physical Exam: GEN: NAD, pleasant  HEENT: Atraumatic, normocephalic, neck supple, EOMI CV: RRR, no murmurs, rubs, or gallops, 2+ bilateral DP pulses, no appreciable JVD  PULM: CTAB, normal effort  ABD: Soft,  generalized tenderness, nondistended, NABS  SKIN: No rash or cyanosis; warm and well-perfused  EXTR: No lower extremity edema or calf tenderness; left posterior leg tenderness, no erythema or swelling, no significant left hip pain.  PSYCH: Mood and affect euthymic, normal rate and volume of speech  NEURO: Awake, alert, no focal deficits grossly, normal speech  Laboratory:  Recent Labs Lab 05/25/13 2105 05/26/13 1158 05/27/13 0525  WBC 12.6* 10.7* 9.7  HGB 12.8 11.3* 10.7*  HCT 37.0 33.4* 32.4*  PLT 521* 456* 444*    Recent Labs Lab 05/24/13 1644 05/25/13 2105 05/26/13 1158  05/27/13 0525  NA 142 139 138 144  K 4.1 3.9 4.0 4.2  CL 109 105 107 114*  CO2 22 22 22 22   BUN 19 21 16 12   CREATININE 2.53* 2.50* 2.15* 2.07*  CALCIUM 8.8 8.8 8.5 8.1*  PROT 6.7 7.5  --   --   BILITOT 0.3 0.2*  --   --   ALKPHOS 44 47  --   --   ALT 11 12  --   --   AST 25 24  --   --   GLUCOSE 100* 94 126* 87   Lactic acid 0.6 Urine Na 39 Urine creatinine 83 FENa 0.8 Trop neg x3   05/26/2013 13:44  Amphetamines NONE DETECTED  Barbiturates NONE DETECTED  Benzodiazepines NONE DETECTED  Opiates POSITIVE (A)  COCAINE POSITIVE (A)  Tetrahydrocannabinol POSITIVE (A)    Imaging/Diagnostic Tests: CT abdomen/pelvis without contrast: Small right renal cyst. Interval increase in size and cortical thickness of the kidneys bilaterally without gross mass or hydronephrosis. This is nonspecific and be seen with medical renal disease, infection and other etiologies. No other intra-abdominal or intrapelvic abnormalities identified.   Beverlyn Roux, MD 05/27/2013, 7:53 AM PGY-1, [place] Intern pager: [phone], text pages welcome

## 2013-05-27 NOTE — Progress Notes (Signed)
*  Preliminary Results* Left lower extremity venous duplex completed. Left lower extremity is negative for deep vein thrombosis. There is no evidence of left Baker's cyst.  05/27/2013 9:44 AM  Maudry Mayhew, RVT, RDCS, RDMS

## 2013-05-27 NOTE — Progress Notes (Signed)
Pt was only able to tolerate a small amount of soap suds enema. Pt with 3 small, round "pebbles" of stool in BSC. Pt states would rather have a suppository. Will continue to monitor.

## 2013-05-27 NOTE — Evaluation (Signed)
Physical Therapy Evaluation Patient Details Name: Ashley [name] MRN: [id number] DOB: [birth year] Today's Date: 05/27/2013 Time: [id number] PT Time Calculation (min): 21 min  PT Assessment / Plan / Recommendation History of Present Illness  Pt is a 62 y.o. female adm due to abdominal pain, weakness.Pt with a PMH of polysubstance abuse.    Clinical Impression  Pt adm due to the above. Presents with decreased independence with functional mobility secondary to deficits listed below. Will benefit from skilled PT to address deficits and increase independence with mobility in order to D/C home with family. Pt is limited by pain in Lt LE and describes pain as sharp and shooting. Pt reports pain began when she fell and when she attempted helping her sister up off the floor 2 months ago. Pt may have injured low back region, unsure if pt has had MRI or x-ray since accident.  Pt has had increased difficulty since fall ambulating up/down her second level to her house. Pt attempting to move to single level apartment if able to get assistance from government. Pt would benefit from post acute rehab but she is unsure due to lack of insurance. Will speak with CM regarding safest D/C disposition. Pt very pleasant and cooperative with therapy.   PT Assessment  Patient needs continued PT services    Follow Up Recommendations  Supervision/Assistance - 24 hour;Outpatient PT;Other (comment) (unsure of transportation )    Does the patient have the potential to tolerate intense rehabilitation      Barriers to Discharge Decreased caregiver support pt does not have physical (A) at home; reports that at times she has to help get her sister off the floor     Equipment Recommendations  Rolling walker with 5" wheels;3in1 (PT)    Recommendations for Other Services OT consult   Frequency Min 3X/week    Precautions / Restrictions Precautions Precautions: Fall Precaution Comments: fell 2 months ago; reports that her Lt LE  just "gives out"  Restrictions Weight Bearing Restrictions: No   Pertinent Vitals/Pain 7/10; requesting pain medicine. patient repositioned for comfort       Mobility  Bed Mobility Bed Mobility: Supine to Sit;Sitting - Scoot to Edge of Bed Supine to Sit: 5: Supervision;HOB flat;With rails Sitting - Scoot to Edge of Bed: 6: Modified independent (Device/Increase time);With rail Details for Bed Mobility Assistance: supervision for safety; relies heavily on handrails for bed mobility Transfers Transfers: Sit to Stand;Stand to Sit Sit to Stand: 4: Min guard;From bed Stand to Sit: 4: Min guard;To chair/3-in-1;With armrests Details for Transfer Assistance: min guard to steady and for safety; cues for safe hand placement  Ambulation/Gait Ambulation/Gait Assistance: 4: Min assist Ambulation Distance (Feet): 30 Feet Assistive device: 1 person hand held assist Ambulation/Gait Assistance Details: pt reaching for external objects to brace herself; has difficulty clearing Lt LE with swing phase; Lt LE did not buckle during this session; min (A) to maintain balance; pt pushing down through supported UE to brace herself throughout gt; very guarded gt  Gait Pattern: Step-through pattern;Decreased stride length;Decreased weight shift to left;Wide base of support;Trunk flexed (ER Lt LE ) Gait velocity: decreased; very guarded due to pain in Lt LE  General Gait Details: c/o pain from lower back; describes pain as "sharp shooting" down her Lt Le  Stairs: No Wheelchair Mobility Wheelchair Mobility: No         PT Diagnosis: Abnormality of gait;Generalized weakness;Acute pain  PT Problem List: Decreased strength;Decreased activity tolerance;Decreased balance;Decreased mobility;Decreased safety awareness;Decreased knowledge of use  of DME;Pain PT Treatment Interventions: DME instruction;Gait training;Stair training;Functional mobility training;Therapeutic activities;Therapeutic exercise;Balance  training;Neuromuscular re-education;Patient/family education     PT Goals(Current goals can be found in the care plan section) Acute Rehab PT Goals Patient Stated Goal: to not have leg pain PT Goal Formulation: With patient Time For Goal Achievement: 06/10/13 Potential to Achieve Goals: Good  Visit Information  Last PT Received On: 05/27/13 Assistance Needed: +1 History of Present Illness: Pt is a 62 y.o. female adm due to abdominal pain, weakness.Pt with a PMH of polysubstance abuse.         Prior Functioning  Home Living Family/patient expects to be discharged to:: Private residence Living Arrangements: Children;Other relatives Available Help at Discharge: Family;Available 24 hours/day Type of Home: House Home Access: Level entry Home Layout: Two level;Bed/bath upstairs Alternate Level Stairs-Number of Steps: 14 Alternate Level Stairs-Rails: Left Home Equipment: None Additional Comments: there is a bedroom and bathroom downstairs but that is where her sister lives; her sister is in poor health per pt and unable to provide physical (A); daughter also lives there but works and is in/out  Prior Function Level of Independence: Independent Comments: pt does not drive; daughter brings groceries; pt reports she is able to ambulate trhroughout stores but requires UE support from cart  Communication Communication: No difficulties Dominant Hand: Right    Cognition  Cognition Arousal/Alertness: Awake/alert Behavior During Therapy: WFL for tasks assessed/performed Overall Cognitive Status: Within Functional Limits for tasks assessed    Extremity/Trunk Assessment Upper Extremity Assessment Upper Extremity Assessment: Defer to OT evaluation Lower Extremity Assessment Lower Extremity Assessment: LLE deficits/detail LLE Deficits / Details: grossly 3+/5; c/o numbness in anterior portion of lower leg that is decreased to light touch  LLE: Unable to fully assess due to pain LLE  Sensation: decreased light touch Cervical / Trunk Assessment Cervical / Trunk Assessment: Normal   Balance Balance Balance Assessed: Yes Static Sitting Balance Static Sitting - Balance Support: Feet supported;No upper extremity supported Static Sitting - Level of Assistance: 7: Independent Static Standing Balance Static Standing - Balance Support: During functional activity;Left upper extremity supported Static Standing - Level of Assistance: Other (comment) (min guard)  End of Session PT - End of Session Equipment Utilized During Treatment: Gait belt Activity Tolerance: Patient tolerated treatment well Patient left: in chair;with call bell/phone within reach Nurse Communication: Mobility status;Precautions;Patient requests pain meds  GP     Gustavus Bryant, [place] 05/27/2013, 9:28 AM

## 2013-05-27 NOTE — Progress Notes (Signed)
FMTS Attending Note Patient seen and examined by me, discussed with resident team and I agree with Dr Quinn Plowman assessment and plan for today. Patient's nausea and abdominal symptoms that are related to food intake; more suggestive of PUD or esophagitis.  Given absence of elevation in alk phos or aminotransferases, makes biliary obstructive process less likely.  Agree with bowel regimen for today, consideration GI consult if not improving with aggressive bowel regimen and IV hydation with liquid diet.  To watch serum Cr, suspect pre-renal cause of AKI.  Dalbert Mayotte, MD

## 2013-05-28 DIAGNOSIS — N179 Acute kidney failure, unspecified: Principal | ICD-10-CM

## 2013-05-28 DIAGNOSIS — R1033 Periumbilical pain: Secondary | ICD-10-CM

## 2013-05-28 DIAGNOSIS — K279 Peptic ulcer, site unspecified, unspecified as acute or chronic, without hemorrhage or perforation: Secondary | ICD-10-CM

## 2013-05-28 DIAGNOSIS — R109 Unspecified abdominal pain: Secondary | ICD-10-CM

## 2013-05-28 LAB — CBC
HCT: 31.3 % — ABNORMAL LOW (ref 36.0–46.0)
MCH: 29.1 pg (ref 26.0–34.0)
MCHC: 32.9 g/dL (ref 30.0–36.0)
MCV: 88.4 fL (ref 78.0–100.0)
RBC: 3.54 MIL/uL — ABNORMAL LOW (ref 3.87–5.11)
RDW: 14.8 % (ref 11.5–15.5)
WBC: 8.9 10*3/uL (ref 4.0–10.5)

## 2013-05-28 LAB — BASIC METABOLIC PANEL
BUN: 9 mg/dL (ref 6–23)
CO2: 23 mEq/L (ref 19–32)
Calcium: 8.3 mg/dL — ABNORMAL LOW (ref 8.4–10.5)
Creatinine, Ser: 2.08 mg/dL — ABNORMAL HIGH (ref 0.50–1.10)
Glucose, Bld: 115 mg/dL — ABNORMAL HIGH (ref 70–99)
Sodium: 142 mEq/L (ref 135–145)

## 2013-05-28 LAB — OCCULT BLOOD X 1 CARD TO LAB, STOOL: Fecal Occult Bld: NEGATIVE

## 2013-05-28 MED ORDER — SODIUM CHLORIDE 0.9 % IV SOLN: INTRAVENOUS | Status: DC

## 2013-05-28 MED ORDER — MORPHINE SULFATE 4 MG/ML IJ SOLN
4.0000 mg | INTRAMUSCULAR | Status: DC | PRN
  Administered 2013-05-28 – 2013-05-30 (×8): 4 mg via INTRAVENOUS
  Filled 2013-05-28 (×8): qty 1

## 2013-05-28 MED ORDER — MORPHINE SULFATE 4 MG/ML IJ SOLN
2.5000 mg | INTRAMUSCULAR | Status: DC | PRN
  Administered 2013-05-28: 2.5 mg via INTRAVENOUS
  Filled 2013-05-28: qty 1

## 2013-05-28 NOTE — Progress Notes (Signed)
FMTS Attending Note  Patient seen and examined by me, discussed with Dr Ardelia Mems and I agree with her plan.  Continued abd pain despite having BM; to consult GI for further recs. Dalbert Mayotte, MD

## 2013-05-28 NOTE — Consult Note (Signed)
Consult Note, for Dr. Benson Norway and Collene Mares ([place])  Primary Care Physician:  Conni Slipper, MD Primary Gastroenterologist:   unassigned  HPI: Ashley [name] is a 62 y.o. female with PMH of remote gastric ulcer with hx of gastric surgery (unknown type), hepatitis C, hypertension, hyperlipidemia, polysubstance abuse presenting with abdominal pain and acute kidney injury found to have UDS positive for opiates, cocaine and marijuana.  Patient reports approximately 2 weeks of mid and epigastric abdominal pain which has been associated with nausea and intermittent vomiting. Vomiting has been nonbloody and nonbilious. Pain is worse with eating at times sharp and stabbing. She reports occasional loose stools associated with her abdominal pain but denies blood in her stool or melena. She has a history of constipation alternating with loose stool. Appetite has been decreased with the pain and oral intake has also decreased. She feels that she has lost weight but cannot quantify. No definite fevers or chills. She reports she has been taking PPI daily at home, some days twice daily.   Past Medical History  Diagnosis Date  . Ulcer 1990    gastric ulcer. Ruptured s/p emergency repair  . Arthritis   . Pancreatitis 2000    resolved  . 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 Hyperlipidemia   . Hypertension   . Cervical radiculopathy 02/28/2011  . Acute kidney injury 05/24/2013  . Cocaine substance abuse 05/26/2013    positive UDS   . Marijuana abuse 12/18/204    positive UDS, family members smoke as well    Past Surgical History  Procedure Laterality Date  . Abdominal hysterectomy  1979  . Repair of perforated ulcer      Prior to Admission medications   Medication Sig Start Date End Date Taking? Authorizing Provider  acetaminophen (TYLENOL) 500 MG tablet Take 1,000 mg by mouth daily as needed for mild pain.   Yes Historical Provider, MD  amLODipine (NORVASC) 10 MG tablet Take 1 tablet  (10 mg total) by mouth daily. 05/24/13  Yes Josalyn C Funches, MD  HYDROcodone-acetaminophen (NORCO/VICODIN) 5-325 MG per tablet Take 1 tablet by mouth every 6 (six) hours as needed for moderate pain or severe pain. 05/24/13  Yes Josalyn C Funches, MD  lisinopril (PRINIVIL,ZESTRIL) 10 MG tablet Take 10 mg by mouth daily.   Yes Historical Provider, MD  loperamide (IMODIUM) 2 MG capsule Take 1 capsule (2 mg total) by mouth as needed for diarrhea or loose stools. 05/24/13  Yes Josalyn C Funches, MD  ondansetron (ZOFRAN ODT) 8 MG disintegrating tablet Take 1 tablet (8 mg total) by mouth every 8 (eight) hours as needed for nausea or vomiting. 05/24/13  Yes Josalyn C Funches, MD  pantoprazole (PROTONIX) 40 MG tablet Take 1 tablet (40 mg total) by mouth 2 (two) times daily before a meal. 05/24/13  Yes Josalyn C Funches, MD  traMADol (ULTRAM) 50 MG tablet Take 50 mg by mouth every 6 (six) hours as needed for moderate pain.   Yes Historical Provider, MD  ciprofloxacin (CIPRO) 500 MG tablet Take 1 tablet (500 mg total) by mouth 2 (two) times daily. 05/26/13   Varney Biles, MD  HYDROcodone-acetaminophen (NORCO/VICODIN) 5-325 MG per tablet Take 1 tablet by mouth every 6 (six) hours as needed. 05/26/13   Varney Biles, MD  metroNIDAZOLE (FLAGYL) 500 MG tablet Take 1 tablet (500 mg total) by mouth 2 (two) times daily. 05/26/13   Varney Biles, MD  ranitidine (ZANTAC) 150 MG tablet Take 1 tablet (  150 mg total) by mouth 2 (two) times daily. 05/26/13   Varney Biles, MD    Current Facility-Administered Medications  Medication Dose Route Frequency Provider Last Rate Last Dose  . 0.45 % sodium chloride infusion   Intravenous Continuous Hilton Sinclair, MD 125 mL/hr at 05/26/13 1255 125 mL/hr at 05/26/13 1255  . acetaminophen (TYLENOL) tablet 650 mg  650 mg Oral Q6H PRN Josalyn C Funches, MD      . amLODipine (NORVASC) tablet 10 mg  10 mg Oral Daily Hilton Sinclair, MD   10 mg at 05/28/13 0902  .  feeding supplement (RESOURCE BREEZE) (RESOURCE BREEZE) liquid 1 Container  1 Container Oral BID BM Erlene Quan, RD   1 Container at 05/28/13 0904  . heparin injection 5,000 Units  5,000 Units Subcutaneous Q8H Hilton Sinclair, MD   5,000 Units at 05/28/13 [phone]  . morphine 2 MG/ML injection 2 mg  2 mg Intravenous Q2H PRN Hilton Sinclair, MD   2 mg at 05/28/13 1221  . ondansetron (ZOFRAN) tablet 4 mg  4 mg Oral Q6H PRN Hilton Sinclair, MD       Or  . ondansetron [facility]) injection 4 mg  4 mg Intravenous Q6H PRN Hilton Sinclair, MD   4 mg at 05/26/13 2202  . pantoprazole (PROTONIX) EC tablet 40 mg  40 mg Oral BID AC Hilton Sinclair, MD   40 mg at 05/28/13 0748  . polyethylene glycol (MIRALAX / GLYCOLAX) packet 34 g  34 g Oral BID Beverlyn Roux, MD   34 g at 05/28/13 0903  . sucralfate (CARAFATE) 1 GM/10ML suspension 1 g  1 g Oral TID WC & HS Hilton Sinclair, MD   1 g at 05/28/13 1347    Allergies as of 05/25/2013 - Review Complete 05/25/2013  Allergen Reaction Noted  . Aspirin Other (See Comments) 07/18/2009  . Ibuprofen Other (See Comments) 12/24/2009    Family History  Problem Relation Age of Onset  . Hypertension Father   . Cancer Father   . Hyperlipidemia Father   . Seizures Sister   . Early death Daughter   . Kidney disease Daughter     end stage dialysis dependent     History   Social History  . Marital Status: Widowed    Spouse Name: N/A    Number of Children: N/A  . Years of Education: N/A   Occupational History  . Not on file.   Social History Main Topics  . Smoking status: Current Every Day Smoker -- 0.30 packs/day for 40 years    Types: Cigarettes  . Smokeless tobacco: Never Used  . Alcohol Use: 0.0 oz/week    0 Cans of beer per week     Comment: occ  . Drug Use: No  . Sexual Activity: No   Other Topics Concern  . Not on file   Social History Narrative  . No narrative on file    Review of Systems: All systems reviewed  an negative except where noted in HPI.   Physical Exam: Vital signs in last 24 hours: Temp:  [98.3 F (36.8 C)-99.1 F (37.3 C)] 98.7 F (37.1 C) (12/20 1300) Pulse Rate:  [61-71] 70 (12/20 1300) Resp:  [18-20] 18 (12/20 1300) BP: (116-151)/(65-79) 126/69 mmHg (12/20 1300) SpO2:  [93 %-96 %] 96 % (12/20 1300) Weight:  [153 lb 6.4 oz (69.582 kg)] 153 lb 6.4 oz (69.582 kg) (12/19 2312) Last BM Date: 05/27/13 Gen: awake, alert, NAD  HEENT: anicteric, op clear CV: RRR, no mrg Pulm: CTA b/l Abd: soft, epigastric and midabdominal tenderness with deep palpation, nondistended, positive bowel sounds  Ext: no c/c/e Neuro: nonfocal   Lab Results:  Recent Labs  05/26/13 1158 05/27/13 0525 05/28/13 0635  WBC 10.7* 9.7 8.9  HGB 11.3* 10.7* 10.3*  HCT 33.4* 32.4* 31.3*  PLT 456* 444* 387   BMET  Recent Labs  05/26/13 1158 05/27/13 0525 05/28/13 0635  NA 138 144 142  K 4.0 4.2 3.8  CL 107 114* 110  CO2 22 22 23   GLUCOSE 126* 87 115*  BUN 16 12 9   CREATININE 2.15* 2.07* 2.08*  CALCIUM 8.5 8.1* 8.3*  Results for NAVNEET, BUHR (MRN [id number]) as of 05/28/2013 14:49  Ref. Range 05/15/2013 11:59 05/24/2013 16:44 05/25/2013 21:05 05/26/2013 11:58 05/27/2013 05:25 05/28/2013 06:35  BUN Latest Range: 6-23 mg/dL 16 19 21 16 12 9   Creatinine Latest Range: 0.50-1.10 mg/dL 1.80 (H) 2.53 (H) 2.50 (H) 2.15 (H) 2.07 (H) 2.08 (H)     LFT  Recent Labs  05/25/13 2105  PROT 7.5  ALBUMIN 2.9*  AST 24  ALT 12  ALKPHOS 47  BILITOT 0.2*   Drugs of Abuse     Component Value Date/Time   LABOPIA POSITIVE* 05/26/2013 1344   COCAINSCRNUR POSITIVE* 05/26/2013 1344   LABBENZ NONE DETECTED 05/26/2013 1344   AMPHETMU NONE DETECTED 05/26/2013 1344   THCU POSITIVE* 05/26/2013 1344   LABBARB NONE DETECTED 05/26/2013 1344     Studies/Results: CT ABDOMEN AND PELVIS WITHOUT CONTRAST   TECHNIQUE: Multidetector CT imaging of the abdomen and pelvis was performed following the standard  protocol without intravenous contrast. Sagittal and coronal MPR images reconstructed from axial data set.   COMPARISON:  04/27/2005   FINDINGS: Small posterior left diaphragmatic hernia containing fat.   Within limits of a nonenhanced exam no focal abnormalities of the liver, spleen, pancreas, or adrenal glands.   Kidneys appear somewhat enlarged bilaterally at with thickening of renal cortex since previous study.   No calcification hydronephrosis identified.   Small cyst at inferior pole right kidney 16 mm diameter image 32, unchanged.   Extensive atherosclerotic calcifications.   Normal appendix, retrocecal.   Stomach and bowel loops normal appearance.   Unremarkable ureters and bladder.   Uterus surgically absent.   No mass, adenopathy, free fluid or inflammatory process.   No acute osseous findings.   IMPRESSION: Small right renal cyst.   Interval increase in size and cortical thickness of the kidneys bilaterally without gross mass or hydronephrosis.   This is nonspecific and be seen with medical renal disease, infection and other etiologies.   No other intra-abdominal or intrapelvic abnormalities identified   Impression / Plan:  62 y.o. female with PMH of remote gastric ulcer with hx of gastric surgery (unknown type), hepatitis C, hypertension, hyperlipidemia, polysubstance abuse presenting with abdominal pain and acute kidney injury found to have UDS positive for opiates, cocaine and marijuana.  1.  Epigastric pain/N/V --  differential includes PUD, gastroduodenitis either H. pylori or ischemic in the setting of cocaine abuse, cholecystitis (less likely with normal LFTs).  I recommend a liver and gallbladder ultrasound and if negative an upper endoscopy.  Upper endoscopy was discussed today including the risks and benefits and she is agreeable to proceed. N.p.o. after midnight for possible endoscopy   2.  Anemia -- likely in part dilutional, no evidence for GI  bleeding. In fact stools heme-negative      LOS: 3  days   Beau Vanduzer M  05/28/2013, 2:43 PM

## 2013-05-28 NOTE — Progress Notes (Signed)
Family Medicine Teaching Service Daily Progress Note Intern Pager: [phone]  Patient name: Ashley [name] Medical record number: [id number] Date of birth: 1950/07/17 Age: 62 y.o. Gender: female  Primary Care Provider: Conni Slipper, MD Consultants: none Code Status: full  Pt Overview and Major Events to Date:  12/18 - Admitted with abdominal pain and AKI  Assessment and Plan: Ashley [name] is a 62 y.o. female presenting with abdominal pain and AKI. PMH is significant for PUD, pancreatitis, hepatitis C, HLD and HTN.   # Abdominal pain - intermittent and sharp with h/o PUD. CT abdomen with no acute findings including mesenteric ischemia or renal calculus. Lipse 62. Could see this with PUD. Recent workup in clinic 12/16 with normal AST, ALT, and bilirubin. Afebrile with stable vitals.   - IV hydration.  - Diet clear liquids. Decrease to NPO if pain intensifies.  - Continue home PPI, have added PO carafate.  - Morphine 2mg  q2 hours IV prn pain. Zofran prn nausea.  - lactic acid 0.6 - Serial abdominal exams to evaluate for evolving process/mesenteric ischemia. If early, may have been missed by CT abdomen.  - UDS positive for marijuana which can cause cyclic vomiting.  - Will not continue cipro/flagyl as no sign of colitis or diverticulitis on CT abd.  - FOBT negative - had large volume BM's overnight with small amount soap suds followed by suppositories, pain not better even with this - consider GI consult today as pain no better.  # Kidney injury - Uncertain if acute or acute on chronic. Last normal Cr was 02/2011 at 0.91. After that, next value 05/15/13 is 1.8 and then 12/16 was 2.53. Most likely prerenal given reported decreased PO intake. Findings of medical renal disease with renal cortical thickness increase on CT abdomen, and small right renal cyst.  - holding lisinopril.  - IV hydration.  - FeNa 0.8 - suggests prerenal - creat stable this am at 2.08, moderately improved since  admission - continue to trend BMET  # HTN  - Continue norvasc.  - Hold lisinopril secondary to AKI  # Chest pain - Possibly related to PUD, reproducible on exam, unlikely cardiovascular.  - EKG  - Trop negative x 2   # Leg pain left - chronic per pt, no signs of DVT or fracture, pt able to ambulate, Atypical, moved for past 2 mo down left hip to back of left leg. Occasionally gives out.  - Monitor  -PT/OT following  # Right flank pain - UA with >300 protein and moderate Hgb, many bacteria, 7-10 WBC but many squamous cells. 12/16 urine culture no growth.  - ucx from 12/17 collection - insignificant growth - Will hold off on abx at this time and treat if any signs of infection or dysuria.   # Mild leukocytosis - resolved  # Weight loss- Will need to monitor and f/u as an outpatient.   FEN/GI: clear liq diet, 1/2 ns at 125cc/hr  Prophylaxis: sq heparin - d/c and consult GI if FOBT positive.  Disposition: home pending clinical improvement  Subjective: stable pain this am, reports no BM in 1 week  Objective: Temp:  [98.3 F (36.8 C)-99.1 F (37.3 C)] 99.1 F (37.3 C) (12/20 0900) Pulse Rate:  [61-71] 65 (12/20 0900) Resp:  [18-20] 20 (12/20 0900) BP: (116-151)/(65-79) 138/73 mmHg (12/20 0900) SpO2:  [93 %-96 %] 96 % (12/20 0900) Weight:  [153 lb 6.4 oz (69.582 kg)] 153 lb 6.4 oz (69.582 kg) (12/19 2312) Physical Exam: GEN:  NAD, pleasant  HEENT: Atraumatic, normocephalic CV: RRR, no murmurs, rubs, or gallops PULM: CTAB, normal effort  ABD: Soft, generalized tenderness, nondistended, hyperactive bowel sounds SKIN: No rash or cyanosis; warm and well-perfused  EXTR: No lower extremity edema or calf tenderness PSYCH: Mood and affect euthymic, normal rate and volume of speech  NEURO: Awake, alert, no focal deficits grossly, normal speech  Laboratory:  Recent Labs Lab 05/26/13 1158 05/27/13 0525 05/28/13 0635  WBC 10.7* 9.7 8.9  HGB 11.3* 10.7* 10.3*  HCT 33.4* 32.4*  31.3*  PLT 456* 444* 387    Recent Labs Lab 05/24/13 1644  05/25/13 2105 05/26/13 1158 05/27/13 0525 05/28/13 0635  NA 142  --  139 138 144 142  K 4.1  --  3.9 4.0 4.2 3.8  CL 109  --  105 107 114* 110  CO2 22  --  22 22 22 23   BUN 19  --  21 16 12 9   CREATININE 2.53*  < > 2.50* 2.15* 2.07* 2.08*  CALCIUM 8.8  --  8.8 8.5 8.1* 8.3*  PROT 6.7  --  7.5  --   --   --   BILITOT 0.3  --  0.2*  --   --   --   ALKPHOS 44  --  47  --   --   --   ALT 11  --  12  --   --   --   AST 25  --  24  --   --   --   GLUCOSE 100*  --  94 126* 87 115*  < > = values in this interval not displayed. Lactic acid 0.6 Urine Na 39 Urine creatinine 83 FENa 0.8 Trop neg x3   05/26/2013 13:44  Amphetamines NONE DETECTED  Barbiturates NONE DETECTED  Benzodiazepines NONE DETECTED  Opiates POSITIVE (A)  COCAINE POSITIVE (A)  Tetrahydrocannabinol POSITIVE (A)    Imaging/Diagnostic Tests: CT abdomen/pelvis without contrast: Small right renal cyst. Interval increase in size and cortical thickness of the kidneys bilaterally without gross mass or hydronephrosis. This is nonspecific and be seen with medical renal disease, infection and other etiologies. No other intra-abdominal or intrapelvic abnormalities identified.   Leeanne Rio, MD 05/28/2013, 11:12 AM PGY-2, [place] Intern pager: [phone], text pages welcome

## 2013-05-29 ENCOUNTER — Encounter (HOSPITAL_COMMUNITY)
Admission: EM | Disposition: A | Discharge status: Home / Self Care | Payer: Self-pay | Source: Home / Self Care | Attending: Family Medicine

## 2013-05-29 ENCOUNTER — Inpatient Hospital Stay (HOSPITAL_COMMUNITY): Payer: Self-pay

## 2013-05-29 ENCOUNTER — Encounter (HOSPITAL_COMMUNITY): Payer: Self-pay | Admitting: *Deleted

## 2013-05-29 HISTORY — PX: ESOPHAGOGASTRODUODENOSCOPY: SHX5428

## 2013-05-29 LAB — BASIC METABOLIC PANEL
BUN: 7 mg/dL (ref 6–23)
Calcium: 8.6 mg/dL (ref 8.4–10.5)
Chloride: 112 mEq/L (ref 96–112)
GFR calc Af Amer: 31 mL/min — ABNORMAL LOW (ref >90)
GFR calc non Af Amer: 27 mL/min — ABNORMAL LOW (ref >90)
Potassium: 3.9 mEq/L (ref 3.5–5.1)
Sodium: 142 mEq/L (ref 135–145)

## 2013-05-29 LAB — CBC
HCT: 31.9 % — ABNORMAL LOW (ref 36.0–46.0)
Hemoglobin: 11 g/dL — ABNORMAL LOW (ref 12.0–15.0)
MCHC: 34.5 g/dL (ref 30.0–36.0)
MCV: 89.1 fL (ref 78.0–100.0)
RBC: 3.58 MIL/uL — ABNORMAL LOW (ref 3.87–5.11)

## 2013-05-29 SURGERY — EGD (ESOPHAGOGASTRODUODENOSCOPY)
Anesthesia: Moderate Sedation

## 2013-05-29 MED ORDER — FENTANYL CITRATE 0.05 MG/ML IJ SOLN
INTRAMUSCULAR | Status: DC | PRN
  Administered 2013-05-29 (×3): 25 ug via INTRAVENOUS

## 2013-05-29 MED ORDER — MIDAZOLAM HCL 10 MG/2ML IJ SOLN
INTRAMUSCULAR | Status: DC | PRN
  Administered 2013-05-29: 2 mg via INTRAVENOUS
  Administered 2013-05-29 (×2): 1 mg via INTRAVENOUS
  Administered 2013-05-29: 2 mg via INTRAVENOUS

## 2013-05-29 MED ORDER — DIPHENHYDRAMINE HCL 50 MG/ML IJ SOLN
INTRAMUSCULAR | Status: AC
  Filled 2013-05-29: qty 1

## 2013-05-29 MED ORDER — BUTAMBEN-TETRACAINE-BENZOCAINE 2-2-14 % EX AERO
INHALATION_SPRAY | CUTANEOUS | Status: DC | PRN
  Administered 2013-05-29: 2 via TOPICAL

## 2013-05-29 MED ORDER — FENTANYL CITRATE 0.05 MG/ML IJ SOLN
INTRAMUSCULAR | Status: AC
  Filled 2013-05-29: qty 2

## 2013-05-29 MED ORDER — MIDAZOLAM HCL 5 MG/ML IJ SOLN
INTRAMUSCULAR | Status: AC
  Filled 2013-05-29: qty 2

## 2013-05-29 NOTE — Progress Notes (Signed)
Family Medicine Teaching Service Daily Progress Note Intern Pager: [phone]  Patient name: Ashley [name] Medical record number: [id number] Date of birth: September 07, 1950 Age: 62 y.o. Gender: female  Primary Care Provider: Conni Slipper, MD Consultants: none Code Status: full  Pt Overview and Major Events to Date:  12/18 - Admitted with abdominal pain and AKI  Assessment and Plan: Ashley [name] is a 62 y.o. female presenting with abdominal pain and AKI. PMH is significant for PUD, pancreatitis, hepatitis C, HLD and HTN.   # Abdominal pain - intermittent and sharp with h/o PUD. CT abdomen with no acute findings including mesenteric ischemia or renal calculus. Lipse 62. Could see this with PUD. Recent workup in clinic 12/16 with normal AST, ALT, and bilirubin. Afebrile with stable vitals.   - IV hydration.  - Diet full liquids. Decrease to NPO if pain intensifies.  - Continue home PPI, have added PO carafate.  - Morphine 2mg  q2 hours IV prn pain. Zofran prn nausea.  - lactic acid 0.6 - UDS positive for marijuana which can cause cyclic vomiting.  - Will not continue cipro/flagyl as no sign of colitis or diverticulitis on CT abd.  - FOBT negative - appreciate GI c/s - EGD done today with mild gastropathy and scattered small non-bleeding angioectasias in the duodenum - will continue current therapy and await h pylori results  # Kidney injury - Uncertain if acute or acute on chronic. Last normal Cr was 02/2011 at 0.91. After that, next value 05/15/13 is 1.8 and then 12/16 was 2.53. Most likely prerenal given reported decreased PO intake. Findings of medical renal disease with renal cortical thickness increase on CT abdomen, and small right renal cyst.  - holding lisinopril.  - IV hydration.  - FeNa 0.8 - suggests prerenal - creat improved this am at 1.88 - continue to trend BMET  # HTN  - Continue norvasc.  - Hold lisinopril secondary to AKI  # Chest pain - Possibly related to PUD,  reproducible on exam, unlikely cardiovascular.  - EKG  - Trop negative x 2   # Leg pain left - chronic per pt, no signs of DVT or fracture, pt able to ambulate, Atypical, moved for past 2 mo down left hip to back of left leg. Occasionally gives out.  - Monitor  -PT/OT following  # Right flank pain - UA with >300 protein and moderate Hgb, many bacteria, 7-10 WBC but many squamous cells. 12/16 urine culture no growth.  - ucx from 12/17 collection - insignificant growth - Will hold off on abx at this time and treat if any signs of infection or dysuria.   # Mild leukocytosis - resolved  # Weight loss- Will need to monitor and f/u as an outpatient.   FEN/GI: full liq diet, 1/2 ns at 125cc/hr  Prophylaxis: sq heparin   Disposition: home pending clinical improvement  Subjective: pain stable today, no others complaints.  Objective: Temp:  [97.8 F (36.6 C)-99.7 F (37.6 C)] 98.3 F (36.8 C) (12/21 1207) Pulse Rate:  [62-99] 73 (12/21 1207) Resp:  [11-21] 19 (12/21 1207) BP: (132-187)/(53-89) 158/89 mmHg (12/21 1207) SpO2:  [93 %-100 %] 97 % (12/21 1207) Weight:  [151 lb 3.2 oz (68.584 kg)] 151 lb 3.2 oz (68.584 kg) (12/20 2044) Physical Exam: GEN: NAD, pleasant  HEENT: Atraumatic, normocephalic CV: RRR, no murmurs, rubs, or gallops PULM: CTAB, normal effort  ABD: Soft, generalized tenderness, nondistended, hyperactive bowel sounds EXTR: No lower extremity edema or calf tenderness PSYCH:  Mood and affect euthymic, normal rate and volume of speech  NEURO: Awake, alert, no focal deficits grossly, normal speech  Laboratory:  Recent Labs Lab 05/27/13 0525 05/28/13 0635 05/29/13 0615  WBC 9.7 8.9 9.0  HGB 10.7* 10.3* 11.0*  HCT 32.4* 31.3* 31.9*  PLT 444* 387 437*    Recent Labs Lab 05/24/13 1644 05/25/13 2105  05/27/13 0525 05/28/13 0635 05/29/13 0615  NA 142 139  < > 144 142 142  K 4.1 3.9  < > 4.2 3.8 3.9  CL 109 105  < > 114* 110 112  CO2 22 22  < > 22 23 20    BUN 19 21  < > 12 9 7   CREATININE 2.53* 2.50*  < > 2.07* 2.08* 1.88*  CALCIUM 8.8 8.8  < > 8.1* 8.3* 8.6  PROT 6.7 7.5  --   --   --   --   BILITOT 0.3 0.2*  --   --   --   --   ALKPHOS 44 47  --   --   --   --   ALT 11 12  --   --   --   --   AST 25 24  --   --   --   --   GLUCOSE 100* 94  < > 87 115* 83  < > = values in this interval not displayed. Lactic acid 0.6 Urine Na 39 Urine creatinine 83 FENa 0.8 Trop neg x3   05/26/2013 13:44  Amphetamines NONE DETECTED  Barbiturates NONE DETECTED  Benzodiazepines NONE DETECTED  Opiates POSITIVE (A)  COCAINE POSITIVE (A)  Tetrahydrocannabinol POSITIVE (A)    Imaging/Diagnostic Tests: CT abdomen/pelvis without contrast: Small right renal cyst. Interval increase in size and cortical thickness of the kidneys bilaterally without gross mass or hydronephrosis. This is nonspecific and be seen with medical renal disease, infection and other etiologies. No other intra-abdominal or intrapelvic abnormalities identified.   Ashley Haven, MD 05/29/2013, 3:28 PM PGY-2, [place] Intern pager: [phone], text pages welcome

## 2013-05-29 NOTE — Op Note (Signed)
[facility] [place], [id number]   ENDOSCOPY PROCEDURE REPORT  PATIENT: Genni, Carlock  MR#: [id number] BIRTHDATE: 08-08-1950 , 27  yrs. old GENDER: Female ENDOSCOPIST: Jerene Bears, MD REFERRED BY:  Dalbert Mayotte, M.D. PROCEDURE DATE:  05/29/2013 PROCEDURE:  EGD w/ biopsy ASA CLASS:     Class III INDICATIONS:  Epigastric pain.   Nausea. MEDICATIONS: These medications were titrated to patient response per physician's verbal order, Fentanyl 75 mcg IV, and Versed 6 mg IV TOPICAL ANESTHETIC: Cetacaine Spray  DESCRIPTION OF PROCEDURE: After the risks benefits and alternatives of the procedure were thoroughly explained, informed consent was obtained.  The Pentax Gastroscope [id number] endoscope was introduced through the mouth and advanced to the second portion of the duodenum. Without limitations.  The instrument was slowly withdrawn as the mucosa was fully examined.    ESOPHAGUS: A 3 cm hiatal hernia was noted.   A non-obstructing Schatzki ring was found at the gastroesophageal junction and was widely open.   The esophagus was otherwise normal.  STOMACH: There was mild antral gastropathy noted.  Cold forcep biopsies were taken at the antrum, angularis and gastric body. The stomach otherwise appeared normal.  DUODENUM: Several small angioectasias were found in the duodenal bulb and 2nd part of the duodenum.  These were not bleeding.  A sessile polyp measuring 5 mm in size was found in the duodenal sweep.  A polypectomy was performed with a cold forceps.  The resection was complete and the polyp tissue was completely retrieved.  Retroflexed views revealed a hiatal hernia.     The scope was then withdrawn from the patient and the procedure completed.  COMPLICATIONS: There were no complications.  ENDOSCOPIC IMPRESSION: 1.   3 cm hiatal hernia 2.   Non-obstructing Schatzki ring was found at the gastroesophageal junction 3.   The esophagus  was otherwise normal. 4.   There was mild antral gastropathy noted; biopsies for H. pylori 5.   The stomach otherwise appeared normal 6.   Scattered, small, non-bleeding angioectasias found in the duodenal bulb and 2nd part of the duodenum 7.   Sessile polyp measuring 5 mm in size was found in the duodenal bulb; removed with cold forceps  RECOMMENDATIONS: 1.  Await pathology results 2.  Continue PPI 3.  Follow-up of helicobacter pylori status, treat if indicated 4.  If pain persists, consider contrast-enhanced cross-sectional imaging of the abdomen  eSigned:  Jerene Bears, MD 05/29/2013 11:02 AM   CC:The Patient  PATIENT NAME:  Dawnyel, Evilsizor MR#: [id number]

## 2013-05-29 NOTE — Progress Notes (Signed)
FMTS Attending Note Patient seen and examined by me, discussed with resident team and I agree with Dr Ellen Henri assessment and plan for today.  Dalbert Mayotte, MD

## 2013-05-30 ENCOUNTER — Ambulatory Visit: Payer: Self-pay | Admitting: Family Medicine

## 2013-05-30 ENCOUNTER — Encounter (HOSPITAL_COMMUNITY): Payer: Self-pay | Admitting: Internal Medicine

## 2013-05-30 LAB — BASIC METABOLIC PANEL
BUN: 6 mg/dL (ref 6–23)
CO2: 22 mEq/L (ref 19–32)
Chloride: 108 mEq/L (ref 96–112)
Creatinine, Ser: 1.79 mg/dL — ABNORMAL HIGH (ref 0.50–1.10)
Glucose, Bld: 89 mg/dL (ref 70–99)

## 2013-05-30 LAB — CBC
HCT: 31.8 % — ABNORMAL LOW (ref 36.0–46.0)
Hemoglobin: 10.7 g/dL — ABNORMAL LOW (ref 12.0–15.0)
MCH: 29.4 pg (ref 26.0–34.0)
MCV: 87.4 fL (ref 78.0–100.0)
Platelets: 390 10*3/uL (ref 150–400)
RBC: 3.64 MIL/uL — ABNORMAL LOW (ref 3.87–5.11)
WBC: 9.2 10*3/uL (ref 4.0–10.5)

## 2013-05-30 MED ORDER — OXYCODONE HCL 5 MG PO TABS
5.0000 mg | ORAL_TABLET | ORAL | Status: DC | PRN
  Administered 2013-05-30: 5 mg via ORAL
  Filled 2013-05-30: qty 1

## 2013-05-30 MED ORDER — POLYETHYLENE GLYCOL 3350 17 G PO PACK: 17.0000 g | PACK | Freq: Every day | ORAL | Status: DC

## 2013-05-30 MED ORDER — SUCRALFATE 1 GM/10ML PO SUSP: 1.0000 g | Freq: Three times a day (TID) | ORAL | Status: DC

## 2013-05-30 NOTE — Progress Notes (Signed)
Physical Therapy Treatment Patient Details Name: Ashley [name] MRN: [id number] DOB: [birth year] Today's Date: 05/30/2013 Time: [id number] PT Time Calculation (min): 24 min  PT Assessment / Plan / Recommendation  History of Present Illness Pt is a 62 y.o. female adm due to abdominal pain, weakness.Pt with a PMH of polysubstance abuse.     PT Comments   Pt very motivated to increase ambulation distance and participate in therapy. Session focused on increase ambulation and ambulating steps to practice prior to D/C home. Pt continues to reach for external objects for UE support while ambulating, would benefit from [facility] for ambulation prior to returning home. Pt is a fall risk due to balance deficits and pain in Lt LE. 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 Will cont to follow per POC.   Follow Up Recommendations  Other (comment) (would benefit form HH AIDE )     Does the patient have the potential to tolerate intense rehabilitation     Barriers to Discharge        Equipment Recommendations  3in1 (PT);Cane    Recommendations for Other Services OT consult  Frequency Min 3X/week   Progress towards PT Goals Progress towards PT goals: Progressing toward goals  Plan Current plan remains appropriate    Precautions / Restrictions Precautions Precautions: Fall Precaution Comments: fell 2 months ago; reports that her Lt LE just "gives out"  Restrictions Weight Bearing Restrictions: No   Pertinent Vitals/Pain 7/10 "shrap pains" in Lt LE     Mobility  Bed Mobility Bed Mobility: Supine to Sit;Sitting - Scoot to Edge of Bed Supine to Sit: 6: Modified independent (Device/Increase time);HOB flat;With rails Sitting - Scoot to Edge of Bed: 6: Modified independent (Device/Increase time) Details for Bed Mobility Assistance: no physical (A); pt relied on handrails   Transfers Transfers: Sit to Stand;Stand to Sit Sit to Stand: 5: Supervision;From bed Stand to Sit: 5: Supervision;To bed Details for Transfer Assistance: supervision for safety; no LOB noted; pt was reaching for external objects for bracing upon standing; would benefit from cane for UE support Ambulation/Gait Ambulation/Gait Assistance: 4: Min guard Ambulation Distance (Feet): 150 Feet Assistive device: None;1 person hand held assist Ambulation/Gait Assistance Details: HHA at times when pt was reaching for UE support; pt to benfefit from cane for UE support and to help increase ambulation distance while reducing Lt LE pain; cues for sequencing; no LOB noted  Gait Pattern: Step-through pattern;Decreased stride length;Decreased weight shift to left;Wide base of support;Trunk flexed Gait velocity: decreased; very guarded due to pain in Lt LE  Stairs: Yes Stairs Assistance: 4: Min guard;5: Supervision Stairs Assistance Details (indicate cue type and reason): cues for stair negotiating sequencing; min guard for safety and to steady Stair Management Technique: One rail Left;Step to pattern;Sideways Number of Stairs: 2    Exercises General Exercises - Lower Extremity Ankle Circles/Pumps: AROM;Both;10 reps;Seated Long Arc Quad: AROM;Left;10 reps;Seated   PT Diagnosis:    PT Problem List:   PT Treatment Interventions:     PT Goals (current goals can now be found in the care plan section) Acute Rehab PT Goals Patient Stated Goal: get rid of pain PT Goal Formulation: With patient Time For Goal Achievement: 06/10/13 Potential to Achieve Goals: Good  Visit Information  Last PT Received On: 05/30/13 Assistance Needed: +1 History of Present Illness: Pt is a 62 y.o. female  adm due to abdominal pain, weakness.Pt with a PMH of polysubstance abuse.      Subjective Data  Subjective: pt lying supine; agreeable to therapy. states " one nurse said i may get to go home today. i need to." Patient  Stated Goal: get rid of pain   Cognition  Cognition Arousal/Alertness: Awake/alert Behavior During Therapy: WFL for tasks assessed/performed Overall Cognitive Status: Within Functional Limits for tasks assessed    Balance  Balance Balance Assessed: Yes Static Sitting Balance Static Sitting - Balance Support: Feet supported;No upper extremity supported Static Sitting - Level of Assistance: 7: Independent Static Sitting - Comment/# of Minutes: tolerated sitting EOB ~ 5 min and continued to sit EOB  Static Standing Balance Static Standing - Balance Support: No upper extremity supported;During functional activity Static Standing - Level of Assistance: 5: Stand by assistance;Other (comment) (to min guard)  End of Session PT - End of Session Equipment Utilized During Treatment: Gait belt Activity Tolerance: Patient tolerated treatment well Patient left: in bed;Other (comment);with call bell/phone within reach (sitting EOB) Nurse Communication: Mobility status;Other (comment) (Pt sitting EOB)   GP     Melina Modena [place], [place] [phone] 05/30/2013, 9:21 AM

## 2013-05-30 NOTE — Progress Notes (Signed)
Subjective: No acute events.  Feeling better.  Objective: Vital signs in last 24 hours: Temp:  [97.8 F (36.6 C)-98.8 F (37.1 C)] 98.7 F (37.1 C) (12/22 0800) Pulse Rate:  [66-81] 81 (12/22 0800) Resp:  [15-19] 18 (12/22 0800) BP: (134-166)/(53-89) 141/62 mmHg (12/22 0800) SpO2:  [93 %-99 %] 98 % (12/22 0800) Weight:  [151 lb 6.4 oz (68.675 kg)] 151 lb 6.4 oz (68.675 kg) (12/21 2026) Last BM Date: 05/29/13  Intake/Output from previous day: 12/21 0701 - 12/22 0700 In: 4883.3 [P.O.:600; I.V.:4283.3] Out: 700 [Urine:700] Intake/Output this shift:    General appearance: alert and sleepy GI: mild epigastric tenderness  Lab Results:  Recent Labs  05/28/13 0635 05/29/13 0615 05/30/13 0751  WBC 8.9 9.0 9.2  HGB 10.3* 11.0* 10.7*  HCT 31.3* 31.9* 31.8*  PLT 387 437* 390   BMET  Recent Labs  05/28/13 0635 05/29/13 0615 05/30/13 0751  NA 142 142 141  K 3.8 3.9 3.5  CL 110 112 108  CO2 23 20 22   GLUCOSE 115* 83 89  BUN 9 7 6   CREATININE 2.08* 1.88* 1.79*  CALCIUM 8.3* 8.6 8.5   LFT No results found for this basename: PROT, ALBUMIN, AST, ALT, ALKPHOS, BILITOT, BILIDIR, IBILI,  in the last 72 hours PT/INR No results found for this basename: LABPROT, INR,  in the last 72 hours Hepatitis Panel No results found for this basename: HEPBSAG, HCVAB, HEPAIGM, HEPBIGM,  in the last 72 hours C-Diff No results found for this basename: CDIFFTOX,  in the last 72 hours Fecal Lactopherrin No results found for this basename: FECLLACTOFRN,  in the last 72 hours  Studies/Results: US Abdomen Limited  05/29/2013   CLINICAL DATA:  Epigastric pain.  EXAM: US ABDOMEN LIMITED - RIGHT UPPER QUADRANT  COMPARISON:  CT 05/26/2013  FINDINGS: Gallbladder  No gallstones or wall thickening visualized. No sonographic Murphy sign noted.  Common bile duct  Diameter: Normal caliber, 5 mm  Liver:  No focal lesion identified. Within normal limits in parenchymal echogenicity.  Increased echotexture  within the right kidney suggesting chronic medical renal disease. No hydronephrosis. Right pleural effusion noted.  IMPRESSION: No evidence of cholelithiasis or acute cholecystitis.  Echogenic right kidney suggesting chronic medical renal disease.  Right pleural effusion.   Electronically Signed   By: Rolm Baptise M.D.   On: 05/29/2013 10:20    Medications:  Scheduled: . amLODipine  10 mg Oral Daily  . feeding supplement (RESOURCE BREEZE)  1 Container Oral BID BM  . heparin  5,000 Units Subcutaneous Q8H  . pantoprazole  40 mg Oral BID AC  . polyethylene glycol  34 g Oral BID  . sucralfate  1 g Oral TID WC & HS   Continuous: . sodium chloride 125 mL/hr at 05/30/13 0300  . sodium chloride      Assessment/Plan: 1) Epigastric pain.   Etiology is unclear.  ? GERD.  Continue with Protonix.    Plan: 1) Protonix BID x 1 month and then QD. 2) Signing off. 3) Follow up with PCP.   LOS: 5 days   Kenza Munar D 05/30/2013, 11:02 AM

## 2013-05-30 NOTE — Progress Notes (Addendum)
Patient only ate about 5% of her lunch, only a few bites before she developed abdominal pain.  Pain medication given with some relief and patient still unable to finish lunch.  Dr. Skeet Simmer notified, will hold discharge orders until we hear back from physician.  Will continue to monitor.  Sanda Linger  Spoke with Dr. Skeet Simmer, patient is tolerating liquids and ok to discharge home.  Eddie North Updated. Sanda Linger

## 2013-05-30 NOTE — Progress Notes (Signed)
Family Medicine Teaching Service Daily Progress Note Intern Pager: [phone]  Patient name: Ashley [name] Medical record number: [id number] Date of birth: 1950-12-12 Age: 62 y.o. Gender: female  Primary Care Provider: Conni Slipper, MD Consultants: none Code Status: full  Pt Overview and Major Events to Date:  12/18 - Admitted with abdominal pain and AKI  Assessment and Plan: Ashley [name] is a 62 y.o. female presenting with abdominal pain and AKI. PMH is significant for PUD, pancreatitis, hepatitis C, HLD and HTN.   # Abdominal pain - intermittent and sharp with h/o PUD. CT abdomen with no acute findings including mesenteric ischemia or renal calculus. Lipse 62. Could see this with PUD. Recent workup in clinic 12/16 with normal AST, ALT, and bilirubin. Afebrile with stable vitals.   - d/c IVF  - Heart healthy diet.  - Continue home PPI, have added PO carafate.  - Transition IV morphine to po oxy. Zofran prn nausea.  - lactic acid 0.6 - UDS positive for marijuana which can cause cyclic vomiting.   - FOBT negative - H. pylori negative - appreciate GI c/s - EGD done 12/21 with mild gastropathy and scattered small non-bleeding angioectasias in the duodenum - will continue current therapy - consider CT w/ contrast as an outpatient when kidneys recover  # Kidney injury - Uncertain if acute or acute on chronic. Last normal Cr was 02/2011 at 0.91. After that, next value 05/15/13 is 1.8 and then 12/16 was 2.53. Most likely prerenal given reported decreased PO intake. Findings of medical renal disease with renal cortical thickness increase on CT abdomen, and small right renal cyst.  - holding lisinopril.  - IV hydration.  - FeNa 0.8 - suggests prerenal - creat improved this am at 1.79 - continue to trend BMET  # HTN  - Continue norvasc.  - Hold lisinopril secondary to AKI  # Chest pain - Possibly related to PUD, reproducible on exam, unlikely cardiovascular.  - EKG  - Trop negative  x 2   # Leg pain left - chronic per pt, no signs of DVT or fracture, pt able to ambulate, Atypical, moved for past 2 mo down left hip to back of left leg. Occasionally gives out.  - Monitor  -PT/OT following  # Right flank pain - UA with >300 protein and moderate Hgb, many bacteria, 7-10 WBC but many squamous cells. 12/16 urine culture no growth.  - ucx from 12/17 collection - insignificant growth - Will hold off on abx at this time and treat if any signs of infection or dysuria.   # Mild leukocytosis - resolved  # Weight loss- Will need to monitor and f/u as an outpatient.   FEN/GI: full liq diet, 1/2 ns at 125cc/hr  Prophylaxis: sq heparin   Disposition: home this pm pending po pain control  Subjective: pain stable today, no others complaints.  Objective: Temp:  [97.8 F (36.6 C)-98.8 F (37.1 C)] 98.7 F (37.1 C) (12/22 0800) Pulse Rate:  [62-99] 81 (12/22 0800) Resp:  [11-21] 18 (12/22 0800) BP: (134-187)/(53-89) 141/62 mmHg (12/22 0800) SpO2:  [93 %-100 %] 98 % (12/22 0800) Weight:  [151 lb 6.4 oz (68.675 kg)] 151 lb 6.4 oz (68.675 kg) (12/21 2026) Physical Exam: GEN: NAD, pleasant  HEENT: Atraumatic, normocephalic CV: RRR, no murmurs, rubs, or gallops PULM: CTAB, normal effort  ABD: Soft, generalized tenderness, nondistended, hyperactive bowel sounds EXTR: No lower extremity edema or calf tenderness PSYCH: Mood and affect euthymic, normal rate and volume of  speech  NEURO: Awake, alert, no focal deficits grossly, normal speech  Laboratory:  Recent Labs Lab 05/28/13 0635 05/29/13 0615 05/30/13 0751  WBC 8.9 9.0 9.2  HGB 10.3* 11.0* 10.7*  HCT 31.3* 31.9* 31.8*  PLT 387 437* 390    Recent Labs Lab 05/24/13 1644 05/25/13 2105  05/28/13 0635 05/29/13 0615 05/30/13 0751  NA 142 139  < > 142 142 141  K 4.1 3.9  < > 3.8 3.9 3.5  CL 109 105  < > 110 112 108  CO2 22 22  < > 23 20 22   BUN 19 21  < > 9 7 6   CREATININE 2.53* 2.50*  < > 2.08* 1.88* 1.79*   CALCIUM 8.8 8.8  < > 8.3* 8.6 8.5  PROT 6.7 7.5  --   --   --   --   BILITOT 0.3 0.2*  --   --   --   --   ALKPHOS 44 47  --   --   --   --   ALT 11 12  --   --   --   --   AST 25 24  --   --   --   --   GLUCOSE 100* 94  < > 115* 83 89  < > = values in this interval not displayed. Lactic acid 0.6 Urine Na 39 Urine creatinine 83 FENa 0.8 Trop neg x3   05/26/2013 13:44  Amphetamines NONE DETECTED  Barbiturates NONE DETECTED  Benzodiazepines NONE DETECTED  Opiates POSITIVE (A)  COCAINE POSITIVE (A)  Tetrahydrocannabinol POSITIVE (A)   Imaging/Diagnostic Tests: CT abdomen/pelvis without contrast: Small right renal cyst. Interval increase in size and cortical thickness of the kidneys bilaterally without gross mass or hydronephrosis. This is nonspecific and be seen with medical renal disease, infection and other etiologies. No other intra-abdominal or intrapelvic abnormalities identified.   Abd U/S: No evidence of cholelithiasis or acute cholecystitis. Echogenic right kidney suggesting chronic medical renal disease. Right pleural effusion  EGD:  1. 3 cm hiatal hernia  2. Non-obstructing Schatzki ring was found at the gastroesophageal junction  3. The esophagus was otherwise normal.  4. There was mild antral gastropathy noted; biopsies for H. pylori  5. The stomach otherwise appeared normal  6. Scattered, small, non-bleeding angioectasias found in the duodenal bulb and 2nd part of the duodenum  7. Sessile polyp measuring 5 mm in size was found in the duodenal bulb; removed with cold forceps   Beverlyn Roux, MD 05/30/2013, 9:55 AM PGY-1, [place] Intern pager: [phone], text pages welcome

## 2013-05-30 NOTE — Progress Notes (Signed)
Occupational Therapy Treatment Patient Details Name: Ashley [name] MRN: [id number] DOB: December 08, 1950 Today's Date: 05/30/2013 Time: [id number] OT Time Calculation (min): 23 min  OT Assessment / Plan / Recommendation  History of present illness Pt is a 62 y.o. female adm due to abdominal pain, weakness.Pt with a PMH of polysubstance abuse.     OT comments  Pt progressing toward goals. Pt continues to require min guard-supervision level with functional mobility and is a fall risk due to Lt LE pain.  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  Pt would greatly benefit from [facility] aide at home to help decrease burden of care and also to decrease fall risk during ADLs/IADLs.  Will continue to follow acutely.  Follow Up Recommendations  Home health OT [facility] aide)    Barriers to Discharge       Equipment Recommendations  3 in 1 bedside comode;Tub/shower bench    Recommendations for Other Services    Frequency Min 2X/week   Progress towards OT Goals Progress towards OT goals: Progressing toward goals  Plan Equipment recommendations need to be updated    Precautions / Restrictions Precautions Precautions: Fall Precaution Comments: fell 2 months ago; reports that her Lt LE just "gives out"  Restrictions Weight Bearing Restrictions: No   Pertinent Vitals/Pain See vitals    ADL  Grooming: Wash/dry hands;Wash/dry face;Supervision/safety Where Assessed - Grooming: Unsupported standing Toilet Transfer: Simulated;Min guard Toilet Transfer Method: Sit to Loss adjuster, chartered:  (bed) Equipment Used: Gait belt Transfers/Ambulation Related to ADLs: Pt ambulated to bed<>sink with min guard for safety.  Pt very cautious with ambulation and reaches for furniture/wall for support due to Lt LE pain. ADL Comments: Educated pt on home safety awareness and ADL/IADL techniques to minimize fall risk.  Pt concerned about providing care for her sister at home and became emotional when  discussing burden of care.  Recommended pt set sister up with sponge baths for now rather than risk injury or fall to herself and sister during a tub transfer.  Pt states she may also take a sponge bath for now until she feels strong enough to get in/out of tub.Discussed use of tub transfer bench, and pt unsure whether it would fit in her tub at home but willing to consider.  Provided pt with indigent AE (reacher) for use during IADLs and when retrieving ADL items in order to minimize fall risk and reduce back discomfort that occurs when bending over.    OT Diagnosis:    OT Problem List:   OT Treatment Interventions:     OT Goals(current goals can now be found in the care plan section) Acute Rehab OT Goals Patient Stated Goal: get rid of pain OT Goal Formulation: With patient Time For Goal Achievement: 06/10/12 Potential to Achieve Goals: Good ADL Goals Pt Will Transfer to Toilet: with supervision;ambulating;regular height toilet Pt Will Perform Tub/Shower Transfer: with min guard assist;Tub transfer;shower seat;tub bench Additional ADL Goal #1: pt will gather clothes at supervision level, with AD as needed  Visit Information  Last OT Received On: 05/30/13 Assistance Needed: +1 History of Present Illness: Pt is a 62 y.o. female adm due to abdominal pain, weakness.Pt with a PMH of polysubstance abuse.      Subjective Data      Prior Functioning       Cognition  Cognition Arousal/Alertness: Awake/alert Behavior During Therapy: WFL for tasks assessed/performed Overall Cognitive Status: Within Functional Limits for tasks assessed  Mobility  Bed Mobility Bed Mobility: Supine to Sit;Sitting - Scoot to Edge of Bed;Sit to Supine Supine to Sit: 6: Modified independent (Device/Increase time) Sitting - Scoot to Edge of Bed: 6: Modified independent (Device/Increase time) Sit to Supine: 6: Modified independent (Device/Increase time)  Transfers Transfers: Sit to Stand;Stand to Sit Sit  to Stand: 5: Supervision;From bed Stand to Sit: 5: Supervision;To bed       Balance   End of Session OT - End of Session Equipment Utilized During Treatment: Gait belt Activity Tolerance: Patient tolerated treatment well Patient left: in bed;with call bell/phone within reach  GO   05/30/2013 Darrol Jump OTR/L Pager [phone] Office [phone]   Darrol Jump 05/30/2013, 9:36 AM

## 2013-05-30 NOTE — Progress Notes (Signed)
Patient discharged.  Patient educated on discharge instructions, follow-up appointments, and discharge medications.  Prescriptions sent electronically to [facility].  Patient educated on abdominal pain, signs and symptoms, and when to call a doctor.  Patient verbalized understanding of discharge education, medications, and follow-up appointments.  AVS signed.  Patient belongings gathered.  IV removed.  Patient ambulated off unit.

## 2013-05-30 NOTE — Progress Notes (Signed)
FMTS Attending Note  I personally saw and evaluated the patient. The plan of care was discussed with the resident team. I agree with the assessment and plan as documented by the resident.   Patient still having abdominal discomfort, tolerating small amount of fluids, no further workup planned from GI perspective. Stable for discharge home with outpatient follow up.   Patient has history of Hepatitis C and would like follow up for this as outpatient. This is unlikely contributing to acute abdominal pain as LFT's wnl.   Dossie Arbour MD

## 2013-05-31 NOTE — Discharge Summary (Addendum)
[facility] Discharge Summary  Patient name: Ashley [name] Medical record number: [id number] Date of birth: 1951/04/21 Age: 62 y.o. Gender: female Date of Admission: 05/25/2013  Date of Discharge: 05/30/13 Admitting Physician: Willeen Niece, MD  Primary Care Provider: Conni Slipper, MD Consultants: GI  Indication for Hospitalization: abdominal pain  Discharge Diagnoses/Problem List:  Patient Active Problem List   Diagnosis Date Noted  . Moderate malnutrition 06/02/2013  . Intractable abdominal pain 05/26/2013  . Acute kidney injury 05/24/2013  . Periumbilical abdominal pain 05/24/2013  . Abdominal pain 05/24/2013  . Fall 08/01/2011  . Cervical radiculopathy 02/28/2011  . Rotator cuff syndrome of right shoulder 02/28/2011  . Back pain 01/10/2011  . Hepatitis C   . Hyperlipidemia   . Hypertension   . TOBACCO ABUSE 12/24/2009  . HYPERLIPIDEMIA 07/18/2009  . MUSCULOSKELETAL PAIN 07/18/2009  . GASTRIC ULCER 11/13/2008  . CONSTIPATION 11/13/2008     Disposition: home  Discharge Condition: stable  Discharge Exam:  GEN: NAD, pleasant  HEENT: Atraumatic, normocephalic  CV: RRR, no murmurs, rubs, or gallops  PULM: CTAB, normal effort  ABD: Soft, generalized tenderness, nondistended, hyperactive bowel sounds  EXTR: No lower extremity edema or calf tenderness  PSYCH: Mood and affect euthymic, normal rate and volume of speech  NEURO: Awake, alert, no focal deficits grossly, normal speech   Brief Hospital Course:  # Abdominal pain: She received a PPI and many negative studies for her abdominal pain. GI was consulted and performed an EGD which identified a few findings that were not likely to cause pain. It improved somewhat with an aggressive bowel regimen. At this point it is likely functional abdominal pain but she continue to be worked up by GI outpatient.  # AKI: This was thought to be prerenal and related to poor PO intake given her FeNa  of 0.8. Her baseline was normal at 0.9 and it bumped to 2.53. It trended down with hydration during her stay and was 1.79 on discharge.  Issues for Follow Up:  # Abdominal pain: Not sure where this is coming from but may have a psychosomatic component. She would likely benefit from close follow-up with the same provider and potentially substance abuse counseling as the cocaine is also likely to play a role.  Significant Procedures: EGD  Significant Labs and Imaging:   Recent Labs Lab 05/28/13 0635 05/29/13 0615 05/30/13 0751  WBC 8.9 9.0 9.2  HGB 10.3* 11.0* 10.7*  HCT 31.3* 31.9* 31.8*  PLT 387 437* 390    Recent Labs Lab 05/26/13 1158 05/27/13 0525 05/28/13 0635 05/29/13 0615 05/30/13 0751  NA 138 144 142 142 141  K 4.0 4.2 3.8 3.9 3.5  CL 107 114* 110 112 108  CO2 22 22 23 20 22   GLUCOSE 126* 87 115* 83 89  BUN 16 12 9 7 6   CREATININE 2.15* 2.07* 2.08* 1.88* 1.79*  CALCIUM 8.5 8.1* 8.3* 8.6 8.5   Lactic acid 0.6  Urine Na 39  Urine creatinine 83  FENa 0.8  Trop neg x3    05/26/2013 13:44   Amphetamines  NONE DETECTED   Barbiturates  NONE DETECTED   Benzodiazepines  NONE DETECTED   Opiates  POSITIVE (A)   COCAINE  POSITIVE (A)   Tetrahydrocannabinol  POSITIVE (A)    CT abdomen/pelvis without contrast: Small right renal cyst. Interval increase in size and cortical thickness of the kidneys bilaterally without gross mass or hydronephrosis. This is nonspecific and be seen with medical renal  disease, infection and other etiologies. No other intra-abdominal or intrapelvic abnormalities identified.   Abd U/S: No evidence of cholelithiasis or acute cholecystitis. Echogenic right kidney suggesting chronic medical renal disease. Right pleural effusion   EGD:  1. 3 cm hiatal hernia  2. Non-obstructing Schatzki ring was found at the gastroesophageal junction  3. The esophagus was otherwise normal.  4. There was mild antral gastropathy noted; biopsies for H. pylori  5.  The stomach otherwise appeared normal  6. Scattered, small, non-bleeding angioectasias found in the duodenal bulb and 2nd part of the duodenum  7. Sessile polyp measuring 5 mm in size was found in the duodenal bulb; removed with cold forceps  Results/Tests Pending at Time of Discharge: none  Discharge Medications:    Medication List    STOP taking these medications       loperamide 2 MG capsule  Commonly known as:  IMODIUM      TAKE these medications       acetaminophen 500 MG tablet  Commonly known as:  TYLENOL  Take 1,000 mg by mouth daily as needed for mild pain.     amLODipine 10 MG tablet  Commonly known as:  NORVASC  Take 1 tablet (10 mg total) by mouth daily.     HYDROcodone-acetaminophen 5-325 MG per tablet  Commonly known as:  NORCO/VICODIN  Take 1 tablet by mouth every 6 (six) hours as needed for moderate pain or severe pain.     lisinopril 10 MG tablet  Commonly known as:  PRINIVIL,ZESTRIL  Take 10 mg by mouth daily.     ondansetron 8 MG disintegrating tablet  Commonly known as:  ZOFRAN ODT  Take 1 tablet (8 mg total) by mouth every 8 (eight) hours as needed for nausea or vomiting.     pantoprazole 40 MG tablet  Commonly known as:  PROTONIX  Take 1 tablet (40 mg total) by mouth 2 (two) times daily before a meal.     polyethylene glycol packet  Commonly known as:  MIRALAX / GLYCOLAX  Take 17 g by mouth daily.     sucralfate 1 GM/10ML suspension  Commonly known as:  CARAFATE  Take 10 mLs (1 g total) by mouth 4 (four) times daily -  with meals and at bedtime.     traMADol 50 MG tablet  Commonly known as:  ULTRAM  Take 50 mg by mouth every 6 (six) hours as needed for moderate pain.        Discharge Instructions: Please refer to Patient Instructions section of EMR for full details.  Patient was counseled important signs and symptoms that should prompt return to medical care, changes in medications, dietary instructions, activity restrictions, and follow  up appointments.   Follow-Up Appointments: Follow-up Information   Follow up with MANN,JYOTHI, MD. Schedule an appointment as soon as possible for a visit in 5 days.   Specialty:  Gastroenterology   Contact information:   [address] [place] [address] [phone]       Follow up with Conni Slipper, MD In 1 week.   Specialty:  Family Medicine   Contact information:   [place] [address] [phone]       Beverlyn Roux, MD 06/02/2013, 12:14 AM PGY-1, [place]

## 2013-06-01 NOTE — Discharge Summary (Signed)
I agree with the discharge summary as documented.   Saurav Crumble MD  

## 2013-06-02 DIAGNOSIS — E440 Moderate protein-calorie malnutrition: Secondary | ICD-10-CM | POA: Diagnosis present

## 2013-06-03 NOTE — Discharge Summary (Signed)
I agree with the discharge summary as documented.   Brittan Mapel MD  

## 2013-06-06 ENCOUNTER — Encounter: Payer: Self-pay | Admitting: Internal Medicine

## 2013-06-09 DIAGNOSIS — E785 Hyperlipidemia, unspecified: Secondary | ICD-10-CM

## 2013-06-09 HISTORY — DX: Hyperlipidemia, unspecified: E78.5

## 2013-06-17 ENCOUNTER — Ambulatory Visit: Payer: Self-pay | Admitting: Family Medicine

## 2013-06-27 ENCOUNTER — Ambulatory Visit: Payer: Self-pay | Admitting: Family Medicine

## 2013-08-11 ENCOUNTER — Other Ambulatory Visit: Payer: Self-pay | Admitting: *Deleted

## 2013-08-11 ENCOUNTER — Telehealth: Payer: Self-pay | Admitting: Family Medicine

## 2013-08-11 NOTE — Telephone Encounter (Signed)
Pt called and needs a refill on her BP medication. jw

## 2013-08-11 NOTE — Telephone Encounter (Signed)
Had pt schedule appt for 09/01/13.  Will forward to MD to give one months supply of BP meds please. Toniesha Zellner, Salome Spotted

## 2013-08-12 MED ORDER — AMLODIPINE BESYLATE 10 MG PO TABS: 10.0000 mg | ORAL_TABLET | Freq: Every day | ORAL | Status: DC

## 2013-08-14 MED ORDER — LISINOPRIL 10 MG PO TABS: 10.0000 mg | ORAL_TABLET | Freq: Every day | ORAL | Status: DC

## 2013-08-14 NOTE — Telephone Encounter (Signed)
Had already filled norvasc yesterday. Filled lisinopril today for 1 month. Pt has h/o AKI and did not follow up after hospitalization. We need to check a BMET at f/u visit.  Ashley Sinclair, MD

## 2013-09-01 ENCOUNTER — Telehealth: Payer: Self-pay | Admitting: Family Medicine

## 2013-09-01 ENCOUNTER — Ambulatory Visit: Payer: Self-pay | Admitting: Family Medicine

## 2013-09-01 NOTE — Telephone Encounter (Signed)
Ashley [name] had to cancel appt today due to transportation issue.   Need to have her meds refilled.  Would you please call her back to advise

## 2013-09-06 NOTE — Telephone Encounter (Signed)
Can you help me figure out which medications she needs refilled, [employer]?  Hilton Sinclair, MD

## 2013-09-06 NOTE — Telephone Encounter (Signed)
LM for patient to call back.  Please ask her when she does which medications she needs refills on. Thanks [employer]

## 2013-09-19 ENCOUNTER — Encounter: Payer: Self-pay | Admitting: Family Medicine

## 2013-09-19 ENCOUNTER — Ambulatory Visit (INDEPENDENT_AMBULATORY_CARE_PROVIDER_SITE_OTHER): Payer: Medicare Other | Admitting: Family Medicine

## 2013-09-19 VITALS — BP 160/85 | HR 83 | Temp 98.4°F | Ht 62.0 in | Wt 144.9 lb

## 2013-09-19 DIAGNOSIS — F17200 Nicotine dependence, unspecified, uncomplicated: Secondary | ICD-10-CM

## 2013-09-19 DIAGNOSIS — I10 Essential (primary) hypertension: Secondary | ICD-10-CM

## 2013-09-19 DIAGNOSIS — E785 Hyperlipidemia, unspecified: Secondary | ICD-10-CM

## 2013-09-19 LAB — BASIC METABOLIC PANEL
BUN: 22 mg/dL (ref 6–23)
CALCIUM: 9.4 mg/dL (ref 8.4–10.5)
CHLORIDE: 106 meq/L (ref 96–112)
CO2: 21 meq/L (ref 19–32)
CREATININE: 1.81 mg/dL — AB (ref 0.50–1.10)
GLUCOSE: 93 mg/dL (ref 70–99)
Potassium: 4 mEq/L (ref 3.5–5.3)
Sodium: 139 mEq/L (ref 135–145)

## 2013-09-19 LAB — LIPID PANEL
Cholesterol: 299 mg/dL — ABNORMAL HIGH (ref 0–200)
HDL: 44 mg/dL (ref >39)
LDL Cholesterol: 220 mg/dL — ABNORMAL HIGH (ref 0–99)
TRIGLYCERIDES: 175 mg/dL — AB (ref <150)
Total CHOL/HDL Ratio: 6.8 Ratio
VLDL: 35 mg/dL (ref 0–40)

## 2013-09-19 MED ORDER — AMLODIPINE BESYLATE 10 MG PO TABS: 10.0000 mg | ORAL_TABLET | Freq: Every day | ORAL | Status: DC

## 2013-09-19 NOTE — Patient Instructions (Signed)
Dear Ashley [name],   It was great to see you today. Thank you for coming to clinic. Please read below regarding the issues that we discussed.   1. Blood pressure-start taking amlodipine again. See [place] in 1 week to see how your blood pressure is doing. I am going to get a blood test to see if your kidneys can handle lisinopril. We may consider another medicine called hydrochlorothiazide.  2. You have a history of high cholesterol so we are going to check this again.  3. Great job cutting back to 5-6 cigarettes. 1800quit now is a resource for quitting smoking. They have free nicotine replacement oftentimes. If they do not have any available, you can always get it over the counter and the quit line can help you with amounts. I would recommend trying the gum which is equal to about 2 cigarettes when it is the 2mg  dosing.   Please follow up in clinic in 1-2 weeks to talk about back pain and blood pressure with Dr. Darene Lamer. Please call earlier if you have any questions or concerns.   Sincerely,  Dr. Garret Reddish  You need to talk to Dr. Darene Lamer about the following:  Health Maintenance Due  Topic Date Due  . Tetanus/tdap  09/20/1967  . Colonoscopy  09/20/1998  . Zostavax  09/19/2008  . Pneumococcal Polysaccharide Vaccine Age 80 And Over  09/19/2013

## 2013-09-19 NOTE — Assessment & Plan Note (Addendum)
Poorly controlled off amlodipine. Restart amlodipine at this time. Recheck BMP to see if can tolerate lisinopril again. As alternative, consider HCTZ given african [place]. Follow up with PCP in 1 week.

## 2013-09-19 NOTE — Progress Notes (Signed)
  Garret Reddish, MD Phone: [phone]  Subjective:   Ashley [name] is a 63 y.o. year old very pleasant female patient who presents with the following:  Hypertension Hyperlipidemia BP Readings from Last 3 Encounters:  09/19/13 160/85  05/30/13 141/62  05/30/13 141/62  Home BP monitoring-no Compliant with medications-  BP-ran out of amlodipine 5 days ago, was taken off of lisinopril due to AKI in December  HLD-never on medication ROS-Denies any chest pain or shortness of breath. Mild headache (typical when off meds). Occasional LE edema when on amlodipine. No blurry vision, LE edema, transient weakness, orthopnea, PND.   Tobacco abuse Trying to quit. Down to 5-6 cigarettes per day. States cannot get over this hill. Willing to try gum or patch.  ROS- no shortness of breath as above.   Past Medical History- history of AKI in December, history of abdominal pain with negative work up in December (states decreased appetite today but otherwise pain has resolved), chronic back pain  Medications- reviewed and updated Current Outpatient Prescriptions  Medication Sig Dispense Refill  . acetaminophen (TYLENOL) 500 MG tablet Take 1,000 mg by mouth daily as needed for mild pain.      Marland Kitchen amLODipine (NORVASC) 10 MG tablet Take 1 tablet (10 mg total) by mouth daily.  90 tablet  3  . HYDROcodone-acetaminophen (NORCO/VICODIN) 5-325 MG per tablet Take 1 tablet by mouth every 6 (six) hours as needed for moderate pain or severe pain.  20 tablet  0  . ondansetron (ZOFRAN ODT) 8 MG disintegrating tablet Take 1 tablet (8 mg total) by mouth every 8 (eight) hours as needed for nausea or vomiting.  20 tablet  0  . polyethylene glycol (MIRALAX / GLYCOLAX) packet Take 17 g by mouth daily.  14 each  0   No current facility-administered medications for this visit.    Objective: BP 160/85  Pulse 83  Temp(Src) 98.4 F (36.9 C) (Oral)  Ht 5\' 2"  (1.575 m)  Wt 144 lb 14.4 oz (65.726 kg)  BMI 26.50  kg/m2 Gen: NAD, resting comfortably on table  CV: RRR no murmurs rubs or gallops Lungs: CTAB no crackles, wheeze, rhonchi Abdomen: soft/nontender/nondistended Ext: no edema Skin: warm, dry, no rash Neuro: grossly normal, moves all extremities  Assessment/Plan:  TOBACCO ABUSE Wants to quit-praised desire. Given number for 1800 quit now and advised 2mg  gum (3x a day) should match nicotine need on 5-6 cigarettes.   Hypertension Poorly controlled off amlodipine. Restart amlodipine at this time. Recheck BMP to see if can tolerate lisinopril again. As alternative, consider HCTZ given african [place]. Follow up with PCP in 1 week.   Hyperlipidemia Check lipids today. Patient no longer on pravachol as she was previously. Cost is a concern but patient knows she will likely need statin.     Orders Placed This Encounter  Procedures  . Lipid panel    Order Specific Question:  Has the patient fasted?    Answer:  No  . Basic metabolic panel    Order Specific Question:  Has the patient fasted?    Answer:  No    Meds ordered this encounter  Medications  . amLODipine (NORVASC) 10 MG tablet    Sig: Take 1 tablet (10 mg total) by mouth daily.    Dispense:  90 tablet    Refill:  3

## 2013-09-19 NOTE — Assessment & Plan Note (Signed)
Check lipids today. Patient no longer on pravachol as she was previously. Cost is a concern but patient knows she will likely need statin.

## 2013-09-19 NOTE — Assessment & Plan Note (Signed)
Wants to quit-praised desire. Given number for 1800 quit now and advised 2mg  gum (3x a day) should match nicotine need on 5-6 cigarettes.

## 2013-09-22 ENCOUNTER — Telehealth: Payer: Self-pay | Admitting: Family Medicine

## 2013-09-22 MED ORDER — PRAVASTATIN SODIUM 80 MG PO TABS: 80.0000 mg | ORAL_TABLET | Freq: Every day | ORAL | Status: DC

## 2013-09-22 NOTE — Telephone Encounter (Signed)
Discussed 35% risk of heart attack/stroke in next 10 years based off of lipids. Patient agreeable to start statin but has no prescription drug coverage. Called [facility] and can start pravastatin 80mg  #90 1 tablet daily with 3 refills for $16 each refill.   Also discussed Creatinine remains elevated at 1.8 so will defer decision on lisinopril to Dr. Dianah Field who is seeing her in early May.

## 2013-10-07 ENCOUNTER — Encounter: Payer: Self-pay | Admitting: Family Medicine

## 2013-10-07 ENCOUNTER — Ambulatory Visit (INDEPENDENT_AMBULATORY_CARE_PROVIDER_SITE_OTHER): Payer: Medicare Other | Admitting: Family Medicine

## 2013-10-07 VITALS — BP 143/104 | HR 76 | Temp 98.3°F | Wt 148.0 lb

## 2013-10-07 DIAGNOSIS — F17200 Nicotine dependence, unspecified, uncomplicated: Secondary | ICD-10-CM

## 2013-10-07 DIAGNOSIS — M549 Dorsalgia, unspecified: Secondary | ICD-10-CM

## 2013-10-07 DIAGNOSIS — I10 Essential (primary) hypertension: Secondary | ICD-10-CM

## 2013-10-07 DIAGNOSIS — N179 Acute kidney failure, unspecified: Secondary | ICD-10-CM

## 2013-10-07 DIAGNOSIS — E785 Hyperlipidemia, unspecified: Secondary | ICD-10-CM

## 2013-10-07 MED ORDER — OXYCODONE-ACETAMINOPHEN 5-325 MG PO TABS: 1.0000 | ORAL_TABLET | Freq: Two times a day (BID) | ORAL | Status: DC | PRN

## 2013-10-07 NOTE — Assessment & Plan Note (Signed)
Uncontrolled, though second check today (manual) was at goal for age (57/90).  - Check 2-3 times this week at Drug store and call with values. - If elevated >150/90 and Cr elevated at f/u in 2 weeks, start HCTZ. - Continue norvasc daily. - Return precautions reviewed.

## 2013-10-07 NOTE — Assessment & Plan Note (Signed)
Patient is quitting with quit date set at Mother's Day. - Knows about 1-800-QUIT-NOW number. - Contact me if need help quitting

## 2013-10-07 NOTE — Assessment & Plan Note (Signed)
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 No red flag symptoms. - Recommended not lifting/asking her sister's provider if live-in aide is possible. - If lifting, lift with legs, not back. - 15 percocet rx'ed today. Discussed this will not be chronic. - Tylenol prn 2-3 times daily. - Stay active. - Return precautions reveiwed.

## 2013-10-07 NOTE — Assessment & Plan Note (Addendum)
Just started pravastatin 2-3 weeks ago. - Recommended taking QHS.

## 2013-10-07 NOTE — Patient Instructions (Signed)
Great to see you.  For back pain, Percocet is only for short-term. Take tylenol as needed 2-3 times daily.  Stay active but avoid heavy lifting. Lift with your legs if needed, not with your back. Watch out for red flag symptoms we discussed, and if any arise, go to the ED.  For your BP, Recheck it 2-3 times and call me next week with values so we can decide if we should start anything. Take norvasc daily. If you are dizzy or lightheaded, check BP and hold medication if it is <120/80. If you develop chest pain, shortness of breath, or other concerning symptoms, seek immediate care. Follow up with me in 2 weeks for this.  For your kidneys, Drink 6-8 glasses of water daily. Follow up in 2 weeks. Continue not taking lisinopril.  Take cholesterol medicine before bed.  Great job cutting back on smoking.  Use the 1800-quit now phone number if needed. I am happy to help stay off if needed.  Best,  Hilton Sinclair, MD   Back Pain, Adult Back pain is very common. The pain often gets better over time. The cause of back pain is usually not dangerous. Most people can learn to manage their back pain on their own.  HOME CARE   Stay active. Start with short walks on flat ground if you can. Try to walk farther each day.  Do not sit, drive, or stand in one place for more than 30 minutes. Do not stay in bed.  Do not avoid exercise or work. Activity can help your back heal faster.  Be careful when you bend or lift an object. Bend at your knees, keep the object close to you, and do not twist.  Sleep on a firm mattress. Lie on your side, and bend your knees. If you lie on your back, put a pillow under your knees.  Only take medicines as told by your doctor.  Put ice on the injured area.  Put ice in a plastic bag.  Place a towel between your skin and the bag.  Leave the ice on for 15-20 minutes, 03-04 times a day for the first 2 to 3 days. After that, you can switch between ice and  heat packs.  Ask your doctor about back exercises or massage.  Avoid feeling anxious or stressed. Find good ways to deal with stress, such as exercise. GET HELP RIGHT AWAY IF:   Your pain does not go away with rest or medicine.  Your pain does not go away in 1 week.  You have new problems.  You do not feel well.  The pain spreads into your legs.  You cannot control when you poop (bowel movement) or pee (urinate).  Your arms or legs feel weak or lose feeling (numbness).  You feel sick to your stomach (nauseous) or throw up (vomit).  You have belly (abdominal) pain.  You feel like you may pass out (faint). MAKE SURE YOU:   Understand these instructions.  Will watch your condition.  Will get help right away if you are not doing well or get worse. Document Released: 11/12/2007 Document Revised: 08/18/2011 Document Reviewed: 10/14/2010 [facility] Patient Information 2014 [place].

## 2013-10-07 NOTE — Assessment & Plan Note (Signed)
Last check consistently elevated at 1.8. Prerenal vs intrarenal. - Stay hydrated and off lisinopril. - Refer to nephrologist if Cr elevated at f/u in 2 weeks.

## 2013-10-07 NOTE — Progress Notes (Signed)
Patient ID: Ashley [name], female   DOB: 1950/06/26, 63 y.o.   MRN: [id number] Subjective:   CC: F/u BP and back pain  HPI:   BP: Has not been stable in a while per patient. Since hospitalization in December 2014, creatinine has been elevated and lisinopril has been held. Last visit BP 160/80, today initial check is 143/104, recheck is 135/90 manually. Has had headaches for 2 weeks. Denies chest pain, leg swelling, or dizziness. Does not check BP outside of office visits. Takes norvasc daily.  AKI: Patient has never seen neprhologist. Cr has been in 1.8 range since 05/2013 admission. She admittedly does not do well staying hydrated. She has stopped taking lisinopril.  Back pain - Patient has severe low back pain that hurts even to walk. 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 Her sister has MR and frequently falls. Pain shoots down back of both legs to knees, along with some numbness, left worse than right. She denies perineal numbness, incontinence of urine or stool, or sudden weakness.   Cholesterol: Patient started pravastatin 2-3 weeks ago but is unsure of time of day to take.  Tobacco abuse: Patient is trying to cut back and now only smokes 3 cigarettes (down from 6-7 daily). Worst time is after meals, but she feels she is over that hump.  Review of Systems - Per HPI.   Smoking status: Cutting back, per above.      Objective:  Physical Exam BP 143/104  Pulse 76  Temp(Src) 98.3 F (36.8 C) (Oral)  Wt 148 lb (67.132 kg) GEN: NAD, pleasant CV: RRR, no m/r/g PULM: CTAB, normal effort HEENT: Atraumatic, normocephalic, neck supple, EOMI, sclera clear  ABD: Soft, nontender, nondistended SKIN: No rash or cyanosis; warm and well-perfused EXTR: No lower extremity edema or calf tenderness PSYCH: Mood and affect euthymic, normal rate and volume of speech NEURO: Awake, alert, no focal deficits grossly, normal speech and gait EXTR: Normal gait, midlly  stiff Low back tender at spine and paraspinal, R>L Straight leg raise at 70 deg with pain and L>R tingling Able to bend forward to 60 deg and extend to 15 deg without discomfort    Assessment:     Ashley [name] is a 63 y.o. female with h/o back pain and HTN here for follow up.    Plan:     Hypertension - Uncontrolled, though second check today (manual) was at goal for age (12/90).  - Check 2-3 times this week at Drug store and call with values. - If elevated >150/90 and Cr elevated at f/u in 2 weeks, start HCTZ. - Continue norvasc daily. - Return precautions reviewed.  AKI - Last check consistently elevated at 1.8. Prerenal vs intrarenal. - Stay hydrated and off lisinopril. - Refer to nephrologist if Cr elevated at f/u in 2 weeks.  Back pain - Chronic, worsened recently due to lifting heavy sister off ground. No red flag symptoms. - Recommended not lifting/asking her sister's provider if live-in aide is possible. - If lifting, lift with legs, not back. - 15 percocet rx'ed today. Discussed this will not be chronic. - Tylenol prn 2-3 times daily. - Stay active. - Return precautions reveiwed.  HLD - Just started pravastatin 2-3 weeks ago. - Recommended taking QHS.  Tobacco abuse: Patient is quitting with quit date set at Mother's Day. - Knows about 1-800-QUIT-NOW number. - Contact me if need help quitting  # Health Maintenance: Not discussed  Follow-up: Follow up in 2 weeks for f/u of AKI and HTN.   Hilton Sinclair, MD [place]

## 2013-11-04 ENCOUNTER — Encounter: Payer: Self-pay | Admitting: Family Medicine

## 2013-11-04 ENCOUNTER — Ambulatory Visit (INDEPENDENT_AMBULATORY_CARE_PROVIDER_SITE_OTHER): Payer: Medicare Other | Admitting: Family Medicine

## 2013-11-04 VITALS — BP 156/94 | HR 76 | Temp 98.6°F | Ht 62.0 in | Wt 142.0 lb

## 2013-11-04 DIAGNOSIS — N179 Acute kidney failure, unspecified: Secondary | ICD-10-CM

## 2013-11-04 DIAGNOSIS — I10 Essential (primary) hypertension: Secondary | ICD-10-CM

## 2013-11-04 DIAGNOSIS — M549 Dorsalgia, unspecified: Secondary | ICD-10-CM

## 2013-11-04 DIAGNOSIS — K089 Disorder of teeth and supporting structures, unspecified: Secondary | ICD-10-CM

## 2013-11-04 DIAGNOSIS — F1410 Cocaine abuse, uncomplicated: Secondary | ICD-10-CM | POA: Insufficient documentation

## 2013-11-04 DIAGNOSIS — R0989 Other specified symptoms and signs involving the circulatory and respiratory systems: Secondary | ICD-10-CM

## 2013-11-04 DIAGNOSIS — F17200 Nicotine dependence, unspecified, uncomplicated: Secondary | ICD-10-CM

## 2013-11-04 DIAGNOSIS — R0609 Other forms of dyspnea: Secondary | ICD-10-CM

## 2013-11-04 LAB — CBC
HCT: 37.9 % (ref 36.0–46.0)
Hemoglobin: 12.8 g/dL (ref 12.0–15.0)
MCH: 28.9 pg (ref 26.0–34.0)
MCHC: 33.8 g/dL (ref 30.0–36.0)
MCV: 85.6 fL (ref 78.0–100.0)
Platelets: 386 10*3/uL (ref 150–400)
RBC: 4.43 MIL/uL (ref 3.87–5.11)
RDW: 15.7 % — AB (ref 11.5–15.5)
WBC: 9.1 10*3/uL (ref 4.0–10.5)

## 2013-11-04 LAB — BASIC METABOLIC PANEL
BUN: 24 mg/dL — AB (ref 6–23)
CALCIUM: 9.3 mg/dL (ref 8.4–10.5)
CO2: 22 mEq/L (ref 19–32)
Chloride: 107 mEq/L (ref 96–112)
Creat: 1.88 mg/dL — ABNORMAL HIGH (ref 0.50–1.10)
Glucose, Bld: 100 mg/dL — ABNORMAL HIGH (ref 70–99)
Potassium: 4.2 mEq/L (ref 3.5–5.3)
Sodium: 139 mEq/L (ref 135–145)

## 2013-11-04 MED ORDER — OXYCODONE-ACETAMINOPHEN 5-325 MG PO TABS: 1.0000 | ORAL_TABLET | Freq: Two times a day (BID) | ORAL | Status: DC | PRN

## 2013-11-04 NOTE — Patient Instructions (Addendum)
Continue BP medication. We are checking labs today for your kidney function and shortness of breath on exertion, and I will call if anything is NOT normal. I am giving 10 tabs of roxicet. Do not take with tylenol. This is not a long term solution. Try to have help lifting and lift with your butt and legs. Use heating pad and rest. Stay active but no heavy lifting. I am also referring you to cardiology. If you do not hear from anyone in 1-2 weeks, call us b back. Go get a chest xray at [facility].  As leaving, make an appt with Dr Valentina Lucks for tobacco cessation. Very proud of you for making this decision. Follow up with me in 1-2 weeks to talk about your shortness of breath. If you develop severe shortness of breath, dizziness, chest pain, or other concerns, seek immediate care.   Dental Care: Organization         Address  Phone  Notes  [facility] Department of [facility] [phone] Accepts children up to age 65 who are enrolled in [place] or [place]; pregnant women with a Medicaid card; and children who have applied for Medicaid or [place] Health Choice, but were declined, whose parents can pay a reduced fee at time of service.  [facility] Department of [facility]  [address], [place] [phone] Accepts children up to age 9 who are enrolled in [place] or [place]; pregnant women with a Medicaid card; and children who have applied for Medicaid or [place] Health Choice, but were declined, whose parents can pay a reduced fee at time of service.  [facility]  [place] [phone] Patients are seen by appointment only. Walk-ins are not accepted. [place] will see patients 52 years of age and older. Monday - Tuesday (8am-5pm) Most Wednesdays (8:30-5pm) $30 per visit, cash only  [facility]  [address], [facility] [phone] Patients are seen by appointment only. Walk-ins are not accepted. [place] will see patients 67 years of age and older. One Wednesday Evening (Monthly: Volunteer Based).  $30 per visit, cash only  [place]  [phone] for adults; Children under age 65, call Graduate Pediatric Dentistry at [phone]. Children aged 44-14, please call [phone] to request a pediatric application.  Dental services are provided in all areas of dental care including fillings, crowns and bridges, complete and partial dentures, implants, gum treatment, root canals, and extractions. Preventive care is also provided. Treatment is provided to both adults and children. Patients are selected via a lottery and there is often a waiting list.   [facility] [address] [place]  [phone] [email or web address]   [facility] [address] [place], [place] [phone], Ext. [phone] Second and Fourth Thursday of each month, opens at 6:30 AM; Clinic ends at 9 AM.  Patients are seen on a first-come first-served basis, and a limited number are seen during each clinic.   [facility]  [address] [place], [place] [phone]   Eligibility Requirements You must have lived in [place], [place], or [place] counties for at least the last three months.   You cannot be eligible for state or federal sponsored [employer], including [employer], [place], or [employer].   You generally cannot be eligible  for healthcare insurance through your employer.    How to apply: Eligibility screenings are held every Tuesday and Wednesday afternoon from 1:00 pm until 4:00 pm. You do not need an appointment for the interview!  [facility] [address], [place], [place]   [place]  [place]  [place]  [phone]

## 2013-11-04 NOTE — Progress Notes (Signed)
Patient ID: Ashley [name], female   DOB: 1950-11-14, 63 y.o.   MRN: [id number] Subjective:   CC: follow up BP and back pain  HPI:   Hypertension BP at home has been 130/60s. Pt reports wanting help quitting smoking. She has been able to get down to 3 cigarettes per day. She takes norvasc daily, denying side effects of leg swelling. Denies chest pain. She occasionally has dyspnea going up stairs, new over the past 2 weeks. Denies cough, PND, or orthopnea. Denies dizziness. SOB improves if she sits still a while. Denies weight gain. Not taking lisinopril due to AKI.  AKI Patient has been staying hydrated, holding lisinopril, and avoiding NSAIDS to try and improve her kidney function.  Tobacco cessation She desires help quitting. She smokes 3 cig/day. Times she desires a cigarette are first thing in the morning and after food. She is interested in medication to help her quit.  Back pain Chronic, worsened last visit due to lifting and again today due to lifting sister yesterday. Sister is heavy and dependent on patient, and fell again yesterday. We had discussed her having someone help with lifting. She had one person help and it was still difficulty. She thinks she lifted with her back, too. Denies red flag symptoms.   Review of Systems - Per HPI. Additionally, poor appetite. Bad teeth may be the cause.  Smoking status: 3 cigarettes daily  Objective:  Physical Exam BP 156/94  Pulse 76  Temp(Src) 98.6 F (37 C) (Oral)  Ht 5\' 2"  (1.575 m)  Wt 142 lb (64.411 kg)  BMI 25.97 kg/m2 GEN: NAD HEENT: Atraumatic, normocephalic, neck supple, EOMI, sclera clear, poor dentition CV: RRR, no murmurs, rubs, or gallops PULM: CTAB, normal effort ABD: Soft, nontender, nondistended SKIN: No rash or cyanosis; warm and well-perfused EXTR: No lower extremity edema or calf tenderness PSYCH: Mood and affect euthymic, normal rate and volume of speech NEURO: Awake, alert, no focal deficits grossly, normal  speech and gait    Assessment:     Ashley [name] is a 63 y.o. female with h/o chronic back pain and HTN here for follow up.    Plan:     HTN Well-controlled on current management. Pt still smoking. Likely some component of white coat HTN. H/o cocaine use. - Continue current management with norvasc. Continue holding lisinopril until see Cr. - BMET today - At f/u, discuss if pt continuing smoking cessation and revisit drug use hx.  Dyspnea New, exertional, though no other signs of fluid overload/new CHF and no chest pain. Dyspnea not currently present. No prior stress test in system. 98% O2 sat during ambulation in clinic. - Will refer to cardiology for stress test or consideration of holter monitoring. - BMET, TSH, CBC today - 2 view CXR. - If persistent, echo and BNP; no obvious reason to perform now.  Tobacco cessation Contemplational. Has brought down to 3 cig/day. - Make appt with Dr Valentina Lucks for help with tobacco cessation. Pt unsure which agent she would like to try at this time.  AKI 1.8 at last check. - F/u BMET today, then decide re: lisinopril.  - If still elevated, nephrology referral.   Back pain Chronic with intermittent worsening due to lifting sister. No red flag symptoms. - Discussed avoiding heavy lifting but trying to stay active. Discussed calling a few friends to help if this happens again, since home health is not around when it happens usually. - Rx 10 more percocet. Discussed not chronic. - Tylenol  PRN prior to using percocet. Do not use more than 3g tylenol in a day. - Heating pad, rest, ice. - Return precautions reviewed. - UDS and pain contract at f/u if more percocet needed.  Poor dentition Pt states this is making her have less appetite. - Provided dentsit list.  # Health Maintenance:  - Not discussed - At f/u, discuss if pt needs Hep C follow up.  Follow-up: Follow up in 1-2 weeks for f/u dyspnea.    Hilton Sinclair, MD [place]

## 2013-11-05 LAB — TSH: TSH: 0.85 u[IU]/mL (ref 0.350–4.500)

## 2013-11-06 DIAGNOSIS — K089 Disorder of teeth and supporting structures, unspecified: Secondary | ICD-10-CM | POA: Insufficient documentation

## 2013-11-06 NOTE — Assessment & Plan Note (Signed)
1.8 at last check. - F/u BMET today, then decide re: lisinopril.  - If still elevated, nephrology referral.

## 2013-11-06 NOTE — Assessment & Plan Note (Signed)
Well-controlled on current management. Pt still smoking. Likely some component of white coat HTN. H/o cocaine use. - Continue current management with norvasc. Continue holding lisinopril until see Cr. - BMET today - At f/u, discuss if pt continuing smoking cessation and revisit drug use hx.

## 2013-11-06 NOTE — Assessment & Plan Note (Signed)
Pt states this is making her have less appetite. - Provided dentsit list.

## 2013-11-06 NOTE — Assessment & Plan Note (Signed)
New, exertional, though no other signs of fluid overload/new CHF and no chest pain. Dyspnea not currently present. No prior stress test in system. 98% O2 sat during ambulation in clinic. - Will refer to cardiology for stress test or consideration of holter monitoring. - BMET, TSH, CBC today - 2 view CXR. - If persistent, echo and BNP; no obvious reason to perform now.

## 2013-11-06 NOTE — Assessment & Plan Note (Signed)
Contemplational. Has brought down to 3 cig/day. - Make appt with Dr Valentina Lucks for help with tobacco cessation. Pt unsure which agent she would like to try at this time.

## 2013-11-06 NOTE — Assessment & Plan Note (Signed)
Chronic with intermittent worsening due to lifting sister. No red flag symptoms. - Discussed avoiding heavy lifting but trying to stay active. Discussed calling a few friends to help if this happens again, since home health is not around when it happens usually. - Rx 10 more percocet. Discussed not chronic. - Tylenol PRN prior to using percocet. Do not use more than 3g tylenol in a day. - Heating pad, rest, ice. - Return precautions reviewed. - UDS and pain contract at f/u if more percocet needed.

## 2013-11-08 ENCOUNTER — Telehealth: Payer: Self-pay | Admitting: Family Medicine

## 2013-11-08 NOTE — Telephone Encounter (Addendum)
Patient's creatinine is still at 1.8, which has been her baseline since 05/2013 with next prior check 02/2011 when it was normal. Referring to nephrology for further eval. Discussed with patient at office visit.  Hilton Sinclair, MD PGY-2, [place]

## 2013-11-10 ENCOUNTER — Ambulatory Visit: Payer: Medicare Other | Admitting: Pharmacist

## 2013-11-21 ENCOUNTER — Telehealth: Payer: Self-pay | Admitting: *Deleted

## 2013-11-21 NOTE — Telephone Encounter (Signed)
Spoke with daughter regarding upcoming appt with [facility].  Her appt is 12/01/2013 with Dr. Shelva Majestic.  [place]  They are located in the same building as [facility].  Their number is [phone] if patient or daughter need to reschedule.  She is calling back when she gets home to get all this information.  Thanks [employer]

## 2013-11-28 ENCOUNTER — Ambulatory Visit (INDEPENDENT_AMBULATORY_CARE_PROVIDER_SITE_OTHER): Payer: Medicare Other | Admitting: Family Medicine

## 2013-11-28 ENCOUNTER — Encounter: Payer: Self-pay | Admitting: Family Medicine

## 2013-11-28 VITALS — BP 134/82 | HR 69 | Temp 98.6°F | Ht 62.0 in | Wt 141.0 lb

## 2013-11-28 DIAGNOSIS — N189 Chronic kidney disease, unspecified: Secondary | ICD-10-CM

## 2013-11-28 DIAGNOSIS — F17200 Nicotine dependence, unspecified, uncomplicated: Secondary | ICD-10-CM

## 2013-11-28 DIAGNOSIS — M545 Low back pain, unspecified: Secondary | ICD-10-CM

## 2013-11-28 DIAGNOSIS — N179 Acute kidney failure, unspecified: Secondary | ICD-10-CM

## 2013-11-28 MED ORDER — TRAMADOL HCL 50 MG PO TABS: 50.0000 mg | ORAL_TABLET | Freq: Three times a day (TID) | ORAL | Status: DC | PRN

## 2013-11-28 MED ORDER — OXYCODONE-ACETAMINOPHEN 5-325 MG PO TABS: 1.0000 | ORAL_TABLET | Freq: Two times a day (BID) | ORAL | Status: DC | PRN

## 2013-11-28 NOTE — Patient Instructions (Signed)
Follow up with me in 1 month or sooner if needed for your back. See me when convenient within the n ext month to evaluate your abdominal pain. If you do not hear from our referral to the kidney doctor or to physical therapy, call us in 1-2 weeks.  Best,  Hilton Sinclair, MD

## 2013-11-28 NOTE — Progress Notes (Signed)
Patient ID: MIKESHIA ZOTTER, female   DOB: 08/04/1950, 63 y.o.   MRN: [id number] Subjective:   CC: Back pain f/u , f/u kidneys, smoking cessation  HPI:   Back pain follow up Patient again picked up her sister early this morning and pain is now 9/10. Tylenol does not help. She finished percocet last month. She used ice pack on her back. She denies red flag symptoms of perineal numbness / tingling, leg weakness, or incontinence of stool or urine. She has not seen PT for this. She has occasional numbness in her legs. Denies other change in back pain. When not lifting (between last visit and this morning), she felt so much better that she has not needed tylenol or percocet.   CKD Creatinine rechecked and found at 1.8, unchanged from prior.  Tobacco cessation Desired, but patient missed last appt. Amenable to meet with Dr Valentina Lucks.    Review of Systems - Per HPI.   SH: Continues to smoke cigarettes, wants to quit.    Objective:  Physical Exam BP 134/82  Pulse 69  Temp(Src) 98.6 F (37 C) (Oral)  Ht 5\' 2"  (1.575 m)  Wt 141 lb (63.957 kg)  BMI 25.78 kg/m2 GEN: NAD, pleasant PULM: Normal effort ABD: Soft, nondistended BACK: tender along paraspinal muscles in lumbar spine EXTR: Negative straight leg raise bilaterally 4+/5 bilateral hip flexion and abduction and adduction Normal sensation along bilateral LE NEURO: Awake, alert, no focal deficits, normal speech and gait Psych: Mood and affect euthymic    Assessment:     ILETA SADOSKY is a 63 y.o. female with h/o chronic low back pain here for follow up and to discuss CKD.    Plan:     CKD With creatinine unchange, still at 1.8. - Referral to nephrology placed.  Back pain follow up Clear acute worsening with lifting sister. She has trouble finding help to lift sister. - Try back brace WHILE lifting to encourage lifting with butt and legs. And more importantly (though less likely), continue to try no heavy lifting. Ask for help with  your sister. - PT referral to strengthen core muscles to support back. - Percocet # 10; Transition to tramadol for severe pain (# 20).  - UDS and pain contract today. - Continue heat and ice. - F/u 1 month or sooner PRN. - MRI if change in back pain quality or red flag symptoms.  Tobacco cessation Patient had missed appt with Dr Valentina Lucks. Still desires cessation. - Has another appt scheduled with Dr Valentina Lucks.   # Health Maintenance: Not discussed - Needs appt to f/u hep C.  Follow-up: Follow up in 1 month for f/u of back pain.  Hilton Sinclair, MD [place]

## 2013-11-29 ENCOUNTER — Ambulatory Visit: Payer: Medicare Other | Admitting: Pharmacist

## 2013-11-29 LAB — DRUG SCR UR, PAIN MGMT, REFLEX CONF
AMPHETAMINE SCRN UR: NEGATIVE
BARBITURATE QUANT UR: NEGATIVE
BENZODIAZEPINES.: NEGATIVE
Cocaine Metabolites: NEGATIVE
Creatinine,U: 149.44 mg/dL
Methadone: NEGATIVE
Opiates: NEGATIVE
PROPOXYPHENE: NEGATIVE
Phencyclidine (PCP): NEGATIVE

## 2013-12-01 ENCOUNTER — Encounter: Payer: Self-pay | Admitting: Cardiovascular Disease

## 2013-12-01 ENCOUNTER — Encounter (INDEPENDENT_AMBULATORY_CARE_PROVIDER_SITE_OTHER): Payer: Medicare Other | Admitting: Cardiovascular Disease

## 2013-12-01 VITALS — BP 145/86 | HR 78 | Ht 62.0 in | Wt 145.0 lb

## 2013-12-01 DIAGNOSIS — I10 Essential (primary) hypertension: Secondary | ICD-10-CM

## 2013-12-01 DIAGNOSIS — R0602 Shortness of breath: Secondary | ICD-10-CM

## 2013-12-03 LAB — CANNABANOIDS (GC/LC/MS), URINE: THC-COOH UR CONFIRM: 491 ng/mL — AB (ref <5)

## 2013-12-04 NOTE — Assessment & Plan Note (Signed)
With creatinine unchange, still at 1.8. - Referral to nephrology placed.

## 2013-12-04 NOTE — Assessment & Plan Note (Signed)
Clear acute worsening with lifting sister. She has trouble finding help to lift sister. - Try back brace WHILE lifting to encourage lifting with butt and legs. And more importantly (though less likely), continue to try no heavy lifting. Ask for help with your sister. - PT referral to strengthen core muscles to support back. - Percocet # 10; Transition to tramadol for severe pain (# 20).  - UDS and pain contract today. - Continue heat and ice. - F/u 1 month or sooner PRN. - MRI if change in back pain quality or red flag symptoms.

## 2013-12-04 NOTE — Assessment & Plan Note (Signed)
Patient had missed appt with Dr Valentina Lucks. Still desires cessation. - Has another appt scheduled with Dr Valentina Lucks.

## 2013-12-11 ENCOUNTER — Telehealth: Payer: Self-pay | Admitting: Family Medicine

## 2013-12-11 NOTE — Telephone Encounter (Addendum)
Called to discuss positive UDS for marijuana with patient. Let her know that if this occurs again, I will unfortunately not be able to prescribe her narcotic pain medication any longer. She voiced understanding.  Hilton Sinclair, MD

## 2013-12-19 ENCOUNTER — Telehealth: Payer: Self-pay | Admitting: *Deleted

## 2013-12-19 NOTE — Telephone Encounter (Signed)
Message copied by Bobbye Riggs on Mon Dec 19, 2013  3:15 PM ------      Message from: Conni Slipper T      Created: Sun Dec 18, 2013  5:04 PM      Regarding: RE: appointment       Yes, would still be good to have this evaluation.            ----- Message -----         From: Bobbye Riggs, CMA         Sent: 12/14/2013  11:21 AM           To: Hilton Sinclair, MD      Subject: RE: appointment                                          Spoke with pt.  She states that she was confused and didn't remember you telling her that she was going to a cardiologist.   Pt tells me that she is not having the shortness of breath anymore and wants to know if she still needs to go?  Told her I would send you a message. [place], Salome Spotted            ----- Message -----         From: Hilton Sinclair, MD         Sent: 12/13/2013  12:31 PM           To: [facility]      Subject: FW: appointment                                          This is a message I got from cardiology about ms Burt Knack today FYI.                  ----- Message -----         From: Lauralee Evener, CMA         Sent: 12/13/2013   8:32 AM           To: Hilton Sinclair, MD      Subject: RE: appointment                                          We will  Be happy to see the patient however it may not be with Dr. Claiborne Billings. His schedule is booked up until the end of September. Have your office to call to see when the soonest appointment can be made.      Thanks for the referral.      ----- Message -----         From: Hilton Sinclair, MD         Sent: 12/08/2013  11:48 PM           To: Lauralee Evener, CMA      Subject: RE: appointment  I had referred her to nephrology and cardiology. Is there any way to get her back in?            Thanks for letting me know.            Conni Slipper, MD            ----- Message -----         From: Lauralee Evener, CMA  Sent: 12/01/2013   4:29 PM           To: Hilton Sinclair, MD      Subject: appointment                                              This patient was referred here for cardiac evaluation to be done today by Dr. Claiborne Billings. She was in the exam room waiting to be seen and decided to leave  Before being seen. Patient did not know why she was here. States that she thought that she was seeing a "kidney doctor"                  Thanks for the referral                  Barry Brunner, [place]; AAMA                                                 ------

## 2013-12-19 NOTE — Telephone Encounter (Signed)
New appt for 01/30/14 @ 245pm with Dr. Vivia Ewing.  Pt informed and agreeable. Aniayah Alaniz, Salome Spotted

## 2013-12-28 ENCOUNTER — Ambulatory Visit (INDEPENDENT_AMBULATORY_CARE_PROVIDER_SITE_OTHER): Payer: Medicare Other | Admitting: Family Medicine

## 2013-12-28 ENCOUNTER — Encounter: Payer: Self-pay | Admitting: Family Medicine

## 2013-12-28 VITALS — BP 155/91 | HR 100 | Temp 97.6°F | Resp 18 | Wt 141.0 lb

## 2013-12-28 DIAGNOSIS — M545 Low back pain, unspecified: Secondary | ICD-10-CM

## 2013-12-28 MED ORDER — HYDROCODONE-ACETAMINOPHEN 5-325 MG PO TABS: 1.0000 | ORAL_TABLET | Freq: Every day | ORAL | Status: DC | PRN

## 2013-12-28 NOTE — Progress Notes (Signed)
Patient ID: Ashley [name], female   DOB: 04/22/1951, 63 y.o.   MRN: [id number] Subjective:   CC: follow up back pain  HPI:   64 y.o. female with h/o chronic back pain here for follow up. Stressor: sick sister who Daney frequently has to pick up if she falls. She has contacted her sister's physician to see if she can get a home aid. She has been using back brace with lifting. She denies perineal numbness, leg weakness, or incontinence. She has not had any falls. She takes tylenol 500mg  Q4 hours every day, only when percocet runs out. She does not take tramadol as she reports it does not help. Pain is central lumbar, 10/10.  Walks for 35 minutes daily.  Uses heat therapy.  Has not heard from PT referral. Overall, pain is unimproved.  Review of Systems - Per HPI.  PMH: Reviewed    Objective:  Physical Exam BP 155/91  Pulse 100  Temp(Src) 97.6 F (36.4 C) (Oral)  Resp 18  Wt 141 lb (63.957 kg)  SpO2 100% GEN: NAD, pleasant Back: TTP mid-back and paraspinal muscles throughout lumbar back Negative straight leg raise bilaterally Normal gait    Assessment:     Ashley [name] is a 63 y.o. female with h/o chronic back pain here for follow up of back pain.    Plan:     Follow up back pain Lumbar spine, worsened when she lifts her sister. Wearing brace, attempting to find health aid for sister so she does not need to lift. - Discussed cyclic nature of severe pain and using narcotics only with severe pain, not daily if possible. - Last visit pain contract and positive UDS for marijuana - discussed that if positive again, will need to stop rx'ing narcotics. - Continue back brace. - PT referral - asked nursing to follow up on this and her renal referral. - Will decrease from percocet to vicodin and rx #10 for severe symptoms. Do not use WITH tylenol. - Return precautions reviewed.  - Continue heat and ice. Try to increase activity.   # Health Maintenance:  - Needs appt to f/u hep  C.  Follow-up: Follow up in 1 month for f/u of back pain.   Hilton Sinclair, MD [place]

## 2013-12-28 NOTE — Patient Instructions (Signed)
Good to see you. Increase activity. I will check on your PT and kidney referral. Call in 1 week if you have not heard anything. Seek immediate care if you have the red flag symptoms we discussed. Otherwise, follow up in 1 month.  Best,  Hilton Sinclair, MD

## 2013-12-31 NOTE — Assessment & Plan Note (Signed)
Lumbar spine, worsened when she lifts her sister. Wearing brace, attempting to find health aid for sister so she does not need to lift. - Discussed cyclic nature of severe pain and using narcotics only with severe pain, not daily if possible. - Last visit pain contract and positive UDS for marijuana - discussed that if positive again, will need to stop rx'ing narcotics. - Continue back brace. - PT referral - asked nursing to follow up on this and her renal referral. - Will decrease from percocet to vicodin and rx #10 for severe symptoms. Do not use WITH tylenol. - Return precautions reviewed.  - Continue heat and ice. Try to increase activity.

## 2014-01-19 NOTE — Progress Notes (Signed)
Pt was never seen and had ECG and left.

## 2014-01-30 ENCOUNTER — Encounter: Payer: Self-pay | Admitting: Cardiovascular Disease

## 2014-01-30 ENCOUNTER — Encounter (HOSPITAL_COMMUNITY): Payer: Self-pay | Admitting: *Deleted

## 2014-01-30 ENCOUNTER — Ambulatory Visit (INDEPENDENT_AMBULATORY_CARE_PROVIDER_SITE_OTHER): Payer: Medicare Other | Admitting: Cardiovascular Disease

## 2014-01-30 VITALS — BP 132/82 | HR 75 | Resp 20 | Ht 63.0 in | Wt 139.2 lb

## 2014-01-30 DIAGNOSIS — I159 Secondary hypertension, unspecified: Secondary | ICD-10-CM

## 2014-01-30 DIAGNOSIS — E785 Hyperlipidemia, unspecified: Secondary | ICD-10-CM

## 2014-01-30 DIAGNOSIS — R0609 Other forms of dyspnea: Secondary | ICD-10-CM

## 2014-01-30 DIAGNOSIS — R0989 Other specified symptoms and signs involving the circulatory and respiratory systems: Secondary | ICD-10-CM

## 2014-01-30 DIAGNOSIS — I158 Other secondary hypertension: Secondary | ICD-10-CM

## 2014-01-30 DIAGNOSIS — R0600 Dyspnea, unspecified: Secondary | ICD-10-CM

## 2014-01-30 NOTE — Assessment & Plan Note (Signed)
Her blood pressure is slightly higher than desirable today. Ideally systolic blood pressure will be around 1:30 since she has significant chronic kidney disease

## 2014-01-30 NOTE — Assessment & Plan Note (Signed)
She has substantial problems with exertional dyspnea (NYHA functional class II-III), but without findings in the physical exam to suggest overt CHF. This may be an angina equivalent. She has at least 3 major risk factors for coronary disease (tobacco use, hypercholesterolemia, hypertension). Recommend that she have a myocardial perfusion study. Unfortunately treadmill exercise is not a good choice because of her exercise limiting left sciatic neuropathy. Will also recommend echocardiogram to assess LV systolic and diastolic function.

## 2014-01-30 NOTE — Assessment & Plan Note (Signed)
Her LDL cholesterol is very high prior to treatment with pravastatin. The typical reduction in LDL cholesterol with the current regimen is unlikely to exceed 35%. She will probably have to switch to a more potent statin such as atorvastatin or Crestor. Target LDL cholesterol depends heavily on whether or not to identify CAD. Would wait on the results of her stress test before recommending changes.

## 2014-01-30 NOTE — Progress Notes (Signed)
Patient ID: NEKETA LACE, female   DOB: December 20, 1950, 63 y.o.   MRN: [id number]      Reason for office visit Exertional dyspnea  Ashley [name] is a 63 year old woman with approximately 10 year history of systemic hypertension and recently diagnosed hyperlipidemia on statin therapy. She has a 40 year history of smoking. She continues to smoke roughly 4 cigarettes a day. She has chronic kidney disease, probably stage III and had acute on chronic renal insufficiency December 2014  Over the last few months she has began expressing exertional dyspnea. She has to walk up 14 steps to her home and has to stop once to catch her breath. This is new for her. Sometimes she is exhausted after taking a shower or making the bed. She has to stop and rest repeatedly when doing housework. She lives with her mentally challenged sister who is very heavy and prone to falling and she has to occasionally lift her off the floor.  She denies cough, wheezing or hemoptysis and does not have lower extremity edema, exertional chest pain, dizziness palpitations or syncope. She does complain of right arm pain but this is clearly positional and consistent with a rotator cuff syndrome. Activity is seriously limited by pain in her left groin and hip. Her left leg "gives out" when walking moderate distances.  She has a history of hepatitis C. She denies intravenous drug use. She states that she occasionally will use marijuana because of her severe back pain. She denies other illicit drugs. In December her urine drug screen was positive for cocaine.   Allergies  Allergen Reactions  . Aspirin Other (See Comments)    Stomach ache  . Ibuprofen Other (See Comments)    Stomach ache    Current Outpatient Prescriptions  Medication Sig Dispense Refill  . acetaminophen (TYLENOL) 500 MG tablet Take 1,000 mg by mouth daily as needed for mild pain.      Marland Kitchen amLODipine (NORVASC) 10 MG tablet Take 1 tablet (10 mg total) by mouth daily.  90  tablet  3  . pravastatin (PRAVACHOL) 80 MG tablet Take 1 tablet (80 mg total) by mouth daily.  90 tablet  3   No current facility-administered medications for this visit.    Past Medical History  Diagnosis Date  . Ulcer 1990    gastric ulcer. Ruptured s/p emergency repair  . Arthritis   . Pancreatitis 2000    resolved  . 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 Hyperlipidemia   . Hypertension   . Cervical radiculopathy 02/28/2011  . Acute kidney injury 05/24/2013  . Cocaine substance abuse 05/26/2013    positive UDS   . Marijuana abuse 12/18/204    positive UDS, family members smoke as well    Past Surgical History  Procedure Laterality Date  . Abdominal hysterectomy  1979  . Repair of perforated ulcer    . Esophagogastroduodenoscopy N/A 05/29/2013    Procedure: ESOPHAGOGASTRODUODENOSCOPY (EGD);  Surgeon: Jerene Bears, MD;  Location: [facility] ENDOSCOPY;  Service: Endoscopy;  Laterality: N/A;    Family History  Problem Relation Age of Onset  . Hypertension Father   . Cancer Father   . Hyperlipidemia Father   . Seizures Sister   . Early death Daughter   . Kidney disease Daughter     end stage dialysis dependent     History   Social History  . Marital Status: Widowed    Spouse Name: N/A    Number of Children:  N/A  . Years of Education: N/A   Occupational History  . Not on file.   Social History Main Topics  . Smoking status: Current Every Day Smoker -- 0.25 packs/day for 40 years    Types: Cigarettes  . Smokeless tobacco: Never Used  . Alcohol Use: No  . Drug Use: No  . Sexual Activity: No   Other Topics Concern  . Not on file   Social History Narrative  . No narrative on file    Review of systems: The patient specifically denies any chest pain at rest or with exertion, dyspnea at rest, orthopnea, paroxysmal nocturnal dyspnea, syncope, palpitations, focal neurological deficits, intermittent claudication, lower extremity edema, unexplained weight gain, cough,  hemoptysis or wheezing.  The patient also denies abdominal pain, nausea, vomiting, dysphagia, diarrhea, constipation, polyuria, polydipsia, dysuria, hematuria, frequency, urgency, abnormal bleeding or bruising, fever, chills, unexpected weight changes, mood swings, change in skin or hair texture, change in voice quality, auditory or visual problems, allergic reactions or rashes, new musculoskeletal complaints other than usual "aches and pains".   PHYSICAL EXAM BP 132/82  Pulse 75  Resp 20  Ht 5\' 3"  (1.6 m)  Wt 139 lb 3.2 oz (63.141 kg)  BMI 24.66 kg/m2  General: Alert, oriented x3, no distress Head: no evidence of trauma, PERRL, EOMI, no exophtalmos or lid lag, no myxedema, no xanthelasma; normal ears, nose and oropharynx Neck: normal jugular venous pulsations and no hepatojugular reflux; brisk carotid pulses without delay and no carotid bruits Chest: clear to auscultation, no signs of consolidation by percussion or palpation, normal fremitus, symmetrical and full respiratory excursions Cardiovascular: normal position and quality of the apical impulse, regular rhythm, normal first and second heart sounds, no murmurs, rubs or gallops Abdomen: no tenderness or distention, no masses by palpation, no abnormal pulsatility or arterial bruits, normal bowel sounds, no hepatosplenomegaly Extremities: no clubbing, cyanosis or edema; 2+ radial, ulnar and brachial pulses bilaterally; 2+ right femoral, posterior tibial and dorsalis pedis pulses; 2+ left femoral, posterior tibial and dorsalis pedis pulses; no subclavian or femoral bruits Neurological: grossly nonfocal   EKG: Normal sinus rhythm  Lipid Panel     Component Value Date/Time   CHOL 299* 09/19/2013 1514   TRIG 175* 09/19/2013 1514   HDL 44 09/19/2013 1514   CHOLHDL 6.8 09/19/2013 1514   VLDL 35 09/19/2013 1514   LDLCALC 220* 09/19/2013 1514    BMET    Component Value Date/Time   NA 139 11/04/2013 1453   K 4.2 11/04/2013 1453   CL 107  11/04/2013 1453   CO2 22 11/04/2013 1453   GLUCOSE 100* 11/04/2013 1453   BUN 24* 11/04/2013 1453   CREATININE 1.88* 11/04/2013 1453   CREATININE 1.79* 05/30/2013 0751   CALCIUM 9.3 11/04/2013 1453   GFRNONAA 29* 05/30/2013 0751   GFRAA 33* 05/30/2013 0751     ASSESSMENT AND PLAN Dyspnea on exertion She has substantial problems with exertional dyspnea (NYHA functional class II-III), but without findings in the physical exam to suggest overt CHF. This may be an angina equivalent. She has at least 3 major risk factors for coronary disease (tobacco use, hypercholesterolemia, hypertension). Recommend that she have a myocardial perfusion study. Unfortunately treadmill exercise is not a good choice because of her exercise limiting left sciatic neuropathy. Will also recommend echocardiogram to assess LV systolic and diastolic function.  HYPERLIPIDEMIA Her LDL cholesterol is very high prior to treatment with pravastatin. The typical reduction in LDL cholesterol with the current regimen is unlikely  to exceed 35%. She will probably have to switch to a more potent statin such as atorvastatin or Crestor. Target LDL cholesterol depends heavily on whether or not to identify CAD. Would wait on the results of her stress test before recommending changes.  Hypertension Her blood pressure is slightly higher than desirable today. Ideally systolic blood pressure will be around 1:30 since she has significant chronic kidney disease   Orders Placed This Encounter  Procedures  . Myocardial Perfusion Imaging  . EKG 12-Lead  . 2D Echocardiogram without contrast   No orders of the defined types were placed in this encounter.    Ashley Humbles, MD, [place] [phone] office [phone] pager

## 2014-01-30 NOTE — Patient Instructions (Signed)
Your physician has requested that you have an echocardiogram. Echocardiography is a painless test that uses sound waves to create images of your heart. It provides your doctor with information about the size and shape of your heart and how well your heart's chambers and valves are working. This procedure takes approximately one hour. There are no restrictions for this procedure.  Your physician has requested that you have a lexiscan myoview. For further information please visit [email or web address]. Please follow instruction sheet, as given.    Your physician recommends that you schedule a follow-up appointment After all test are complete, with Dr. Sallyanne Kuster

## 2014-01-31 ENCOUNTER — Ambulatory Visit: Payer: Medicare Other | Admitting: Family Medicine

## 2014-02-02 ENCOUNTER — Telehealth (HOSPITAL_COMMUNITY): Payer: Self-pay

## 2014-02-03 NOTE — Telephone Encounter (Signed)
Encounter complete. 

## 2014-02-08 ENCOUNTER — Encounter (HOSPITAL_COMMUNITY): Payer: Medicare Other

## 2014-02-08 ENCOUNTER — Inpatient Hospital Stay (HOSPITAL_COMMUNITY): Admission: RE | Admit: 2014-02-08 | Payer: Medicare Other | Source: Ambulatory Visit

## 2014-03-08 ENCOUNTER — Ambulatory Visit: Payer: Medicare Other | Admitting: Cardiovascular Disease

## 2014-03-16 ENCOUNTER — Telehealth: Payer: Self-pay | Admitting: *Deleted

## 2014-03-16 NOTE — Telephone Encounter (Signed)
Received fax from [facility]. They have attempted to contact patient X3.  1st time LM with dgt to have mom to call back, next two times the LM.  Per office policy they will not attempt to call again and if we feel she still needs to be referred they process (including wait time) will need to be started all over again. [place], Salome Spotted

## 2014-03-19 ENCOUNTER — Encounter: Payer: Self-pay | Admitting: Family Medicine

## 2014-03-19 NOTE — Telephone Encounter (Signed)
I just wrote her a letter about this. Thank you. Hilton Sinclair, MD

## 2014-05-08 ENCOUNTER — Ambulatory Visit: Payer: Medicare Other | Admitting: Cardiovascular Disease

## 2014-05-08 ENCOUNTER — Encounter: Payer: Self-pay | Admitting: Internal Medicine

## 2014-07-27 ENCOUNTER — Ambulatory Visit: Payer: Medicare Other | Admitting: Family Medicine

## 2014-08-09 ENCOUNTER — Encounter: Payer: Medicare Other | Admitting: Family Medicine

## 2014-08-09 NOTE — Progress Notes (Signed)
This encounter was created in error - please disregard.

## 2014-09-19 ENCOUNTER — Encounter: Payer: Self-pay | Admitting: Family Medicine

## 2014-09-19 ENCOUNTER — Telehealth: Payer: Self-pay | Admitting: Family Medicine

## 2014-09-19 ENCOUNTER — Ambulatory Visit (INDEPENDENT_AMBULATORY_CARE_PROVIDER_SITE_OTHER): Payer: PPO | Admitting: Family Medicine

## 2014-09-19 VITALS — BP 134/82 | HR 72 | Temp 98.3°F | Ht 62.0 in | Wt 134.4 lb

## 2014-09-19 DIAGNOSIS — B182 Chronic viral hepatitis C: Secondary | ICD-10-CM

## 2014-09-19 DIAGNOSIS — R634 Abnormal weight loss: Secondary | ICD-10-CM

## 2014-09-19 LAB — COMPREHENSIVE METABOLIC PANEL
ALK PHOS: 50 U/L (ref 39–117)
ALT: 13 U/L (ref 0–35)
AST: 20 U/L (ref 0–37)
Albumin: 3.3 g/dL — ABNORMAL LOW (ref 3.5–5.2)
BUN: 16 mg/dL (ref 6–23)
CHLORIDE: 104 meq/L (ref 96–112)
CO2: 26 meq/L (ref 19–32)
Calcium: 9.2 mg/dL (ref 8.4–10.5)
Creat: 1.52 mg/dL — ABNORMAL HIGH (ref 0.50–1.10)
Glucose, Bld: 75 mg/dL (ref 70–99)
POTASSIUM: 4.7 meq/L (ref 3.5–5.3)
SODIUM: 141 meq/L (ref 135–145)
Total Bilirubin: 0.4 mg/dL (ref 0.2–1.2)
Total Protein: 6.8 g/dL (ref 6.0–8.3)

## 2014-09-19 LAB — CBC WITH DIFFERENTIAL/PLATELET
BASOS PCT: 1 % (ref 0–1)
Basophils Absolute: 0.1 10*3/uL (ref 0.0–0.1)
EOS PCT: 4 % (ref 0–5)
Eosinophils Absolute: 0.3 10*3/uL (ref 0.0–0.7)
HCT: 45.3 % (ref 36.0–46.0)
HEMOGLOBIN: 14.9 g/dL (ref 12.0–15.0)
Lymphocytes Relative: 39 % (ref 12–46)
Lymphs Abs: 2.9 10*3/uL (ref 0.7–4.0)
MCH: 28.9 pg (ref 26.0–34.0)
MCHC: 32.9 g/dL (ref 30.0–36.0)
MCV: 87.8 fL (ref 78.0–100.0)
MONO ABS: 1 10*3/uL (ref 0.1–1.0)
MPV: 10 fL (ref 8.6–12.4)
Monocytes Relative: 13 % — ABNORMAL HIGH (ref 3–12)
NEUTROS ABS: 3.2 10*3/uL (ref 1.7–7.7)
Neutrophils Relative %: 43 % (ref 43–77)
Platelets: 377 10*3/uL (ref 150–400)
RBC: 5.16 MIL/uL — AB (ref 3.87–5.11)
RDW: 14.6 % (ref 11.5–15.5)
WBC: 7.4 10*3/uL (ref 4.0–10.5)

## 2014-09-19 LAB — POCT GLYCOSYLATED HEMOGLOBIN (HGB A1C): Hemoglobin A1C: 5.8

## 2014-09-19 LAB — TSH: TSH: 1.429 u[IU]/mL (ref 0.350–4.500)

## 2014-09-19 LAB — POCT SEDIMENTATION RATE: POCT SED RATE: 37 mm/hr — AB (ref 0–22)

## 2014-09-19 MED ORDER — NICOTINE 14 MG/24HR TD PT24: 14.0000 mg | MEDICATED_PATCH | Freq: Every day | TRANSDERMAL | Status: DC

## 2014-09-19 MED ORDER — POLYETHYLENE GLYCOL 3350 17 G PO PACK: 17.0000 g | PACK | Freq: Two times a day (BID) | ORAL | Status: DC

## 2014-09-19 NOTE — Progress Notes (Signed)
Patient ID: Ashley [name], female   DOB: [birth year], 64 y.o.   MRN: [id number] Subjective:   CC: Decreased appetite and weight loss  HPI:   Patient presents to same-day clinic with decreased appetite for 2 months. She eats one fruit for breakfast, often does not eat lunch, and will have a normal-sized dinner. She drinks an normal amount of fluids. She also reports constipation despite taking a stool softener. She denies associated fevers, medication changes, abdominal pain, nausea, vomiting, diarrhea, bloating or increased burping, vaginal bleeding, blood in urine or stool, dysuria, or cough. She has not had symptoms like this in the past. She still smokes 6 cigarettes a day but would like to quit. She denies alcohol use or current drug use but she does have a history of drug use. She does feel like her clothes are fitting more loosely.  She also notes recently becoming sexually active and would like to know the name of a lubrication she could use. She does not use protection consistently.   Review of Systems - Per HPI.   PMH: Gastric ulcer, tobacco abuse, constipation, hepatitis C, hyperlipidemia, hypertension, history of AKI, history of abdominal pain, history of cocaine abuse, history of moderate malnutrition Smoking status: 6 cigarettes per day, contemplational    Objective:  Physical Exam BP 134/82 mmHg  Pulse 72  Temp(Src) 98.3 F (36.8 C) (Oral)  Ht 5\' 2"  (1.575 m)  Wt 134 lb 6 oz (60.952 kg)  BMI 24.57 kg/m2 GEN: NAD CV: RRR, no m/r/g PULM: CTAB, normal effort ABD: S/NT/ND, no organomegaly EXTR: No LE edema or calf tenderness HEENT: Atraumatic, normocephalic, sclera clear, extraocular movements intact, no obvious thyromegaly, neck supple, moist mucous membranes Psych: Mood and affect euthymic, pHQ9: 7 with no SI or HI  Assessment:     EVALYSE TACKITT is a 64 y.o. female here for poor appetite.    Plan:     # See problem list and after visit summary for problem-specific  plans. -For improved lubrication, suggested astroglide or other similar lubricant. Follow-up if no improvement.  # Health Maintenance: STD testing per above.  Follow-up: Follow up in 2-3 weeks for f/u of weight loss.  I have spent at least 25 minutes in room with patient, >50% of this time used in counseling.   Hilton Sinclair, MD [place]

## 2014-09-19 NOTE — Telephone Encounter (Addendum)
Blue team, please notify Ms Rantz that labs mostly normal except elevated sedimentation rate which is a sign of inflammation. This could be due to many things, one of which could be her hepatitis C. Will refer her to GI for further evaluation and discussion of treatment.  Documentation: Labs mostly normal (A1c prediabetic at 5.8, HIV and RPR negative, CMET with Cr 1.52 (improved from prior 1.88), low albumin 3.3, but normal Ca and LFTs, normal CBC and TSH) with EXCEPTION of ESR elevated at 37. Concern for inflammatory, malignant, or infective process (h/o Hep C, has not seen GI in recent past). No colonoscopy seen in system. Had planned to have patient f/u for age-appropriate screening discussion.  - At this time, will refer to gastroenterology for evaluation/treatment of patient's chronic hepatitis C. - At f/u with me, will further discuss age-appropriate screening.  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 Will discuss if any symptoms of GERD or dysphagia.    Hilton Sinclair, MD

## 2014-09-19 NOTE — Assessment & Plan Note (Signed)
Poor appetite for 2 months with 5 pounds of weight loss in the last 8 months, 11 pounds in the last 10 months. Chronic tobacco abuse with no reported coughing, hepatitis C, constipation, unprotected sex. PHQ9 suggestive of mild depression. No SI or HI. No obvious medication on list that could contribute to poor appetite. -We'll check CMET, TSH, HIV, RPR, ESR, A1c, and CBC with differential -Discussed using protection regularly to avoid contracting STD. -Discussed adding Ensure or boost once to twice daily in between meals -Nicotine patch prescribed and tobacco cessation quit line number provided. -We'll also treat constipation in efforts to improve appetite. Miralax 1-2 times daily. -Asked patient to follow-up in 2-3 weeks. At that time, we'll need to discuss depression, age-appropriate screenings, UA, CXR, and potential hepatitis C treatment which patient was curious about.

## 2014-09-19 NOTE — Patient Instructions (Addendum)
Good to see you. We are getting labs today and I will call if any are not normal. Please follow up with me in 3 weeks. Drink ensure daily in between your meals 1-2 times daily. Be sure to stay hydrated. Work on quitting smoking. Use 14mg  nicotine patch daily for 6 weeks. Then we will decrease to the 7mg  patch. Use astroglide for lubrication but be sure to always protect yourself against STDs with a condom. Use miralax 1-2 times daily with plenty of water to help with constipation.  Best,  Hilton Sinclair, MD

## 2014-09-20 LAB — RPR

## 2014-09-20 LAB — HIV ANTIBODY (ROUTINE TESTING W REFLEX): HIV: NONREACTIVE

## 2014-09-20 NOTE — Addendum Note (Signed)
Addended by: Conni Slipper T on: 09/20/2014 10:45 PM   Modules accepted: Orders

## 2014-09-20 NOTE — Progress Notes (Signed)
I was the preceptor on the day of this visit.   Tynia Wiers MD  

## 2014-09-21 NOTE — Telephone Encounter (Signed)
Advised pt as directed below and verbalized understanding. Makaya Juneau, CMA. 

## 2014-10-10 ENCOUNTER — Other Ambulatory Visit: Payer: Self-pay | Admitting: Family Medicine

## 2014-10-11 ENCOUNTER — Other Ambulatory Visit: Payer: Self-pay | Admitting: Family Medicine

## 2014-10-11 NOTE — Telephone Encounter (Signed)
Needs refill on pravastatin (PRAVACHOL) and amLODipine [facility]), pharmacy sent the request to the wrong provider yesterday. Patient also states that other scripts were to be sent in for lubrication and smoking, yet she has not received anything yet. Thanks [employer], ASA

## 2014-10-12 MED ORDER — ATORVASTATIN CALCIUM 80 MG PO TABS: 80.0000 mg | ORAL_TABLET | Freq: Every day | ORAL | Status: DC

## 2014-10-12 MED ORDER — AMLODIPINE BESYLATE 10 MG PO TABS: 10.0000 mg | ORAL_TABLET | Freq: Every day | ORAL | Status: DC

## 2014-10-12 MED ORDER — NICOTINE 14 MG/24HR TD PT24: 14.0000 mg | MEDICATED_PATCH | Freq: Every day | TRANSDERMAL | Status: DC

## 2014-10-12 NOTE — Telephone Encounter (Signed)
Called to notify patient:  Sending in amlodipine and resending nicoderm patch rx.  Astroglide was what we discussed for lubrication, which can be purchased without rx.  At last lipid check calculated 10 yr ASCVD risk 35% but patient had no rx drug coverage so sent in pravastatin 80mg . She reports taking daily. Will refuse at this time and instead send in atorvastatin 80mg  in hopes she will have coverage at this time (she stated she is unsure but will call pharmacy to check if covered and notify us if not, at which point can resent pravastatin 80mg ). With h/o Hep C but normal LFTs 09/2014, will have patient return for repeat LFTs in 4-6 weeks and repeat lipids in 3-6 months. Discussed smoking cessation at last visit as well.  Hilton Sinclair, MD

## 2014-10-13 ENCOUNTER — Encounter: Payer: Self-pay | Admitting: Internal Medicine

## 2014-10-24 ENCOUNTER — Encounter: Payer: Self-pay | Admitting: Internal Medicine

## 2014-10-24 ENCOUNTER — Ambulatory Visit (INDEPENDENT_AMBULATORY_CARE_PROVIDER_SITE_OTHER): Payer: PPO | Admitting: Family Medicine

## 2014-10-24 VITALS — BP 145/89 | HR 85 | Temp 98.1°F | Ht 62.0 in | Wt 133.0 lb

## 2014-10-24 DIAGNOSIS — M533 Sacrococcygeal disorders, not elsewhere classified: Secondary | ICD-10-CM

## 2014-10-24 DIAGNOSIS — M62838 Other muscle spasm: Secondary | ICD-10-CM | POA: Diagnosis not present

## 2014-10-24 DIAGNOSIS — B1920 Unspecified viral hepatitis C without hepatic coma: Secondary | ICD-10-CM | POA: Diagnosis not present

## 2014-10-24 DIAGNOSIS — R634 Abnormal weight loss: Secondary | ICD-10-CM | POA: Diagnosis not present

## 2014-10-24 MED ORDER — TIZANIDINE HCL 2 MG PO CAPS: 2.0000 mg | ORAL_CAPSULE | Freq: Three times a day (TID) | ORAL | Status: DC | PRN

## 2014-10-24 NOTE — Patient Instructions (Addendum)
Take tylenol 1-2 times daily as needed. Use the Flexeril 3 times daily as needed but be aware this will cause drowsiness and do not drive after taking it. Continue using heat to the area. Do physical therapy everyday to over time help improve the symptoms. Follow-up in one month if no improvement.  Sacroiliac Joint Dysfunction The sacroiliac joint connects the lower part of the spine (the sacrum) with the bones of the pelvis. CAUSES  Sometimes, there is no obvious reason for sacroiliac joint dysfunction. Other times, it may occur   During pregnancy.  After injury, such as:  Car accidents.  Sport-related injuries.  Work-related injuries.  Due to one leg being shorter than the other.  Due to other conditions that affect the joints, such as:  Rheumatoid arthritis.  Gout.  Psoriasis.  Joint infection (septic arthritis). SYMPTOMS  Symptoms may include:  Pain in the:  Lower back.  Buttocks.  Groin.  Thighs and legs.  Difficult sitting, standing, walking, lying, bending or lifting. DIAGNOSIS  A number of tests may be used to help diagnose the cause of sacroiliac joint dysfunction, including:  Imaging tests to look for other causes of pain, including:  MRI.  CT scan.  Bone scan.  Diagnostic injection: During a special x-ray (called fluoroscopy), a needle is put into the sacroiliac joint. A numbing medicine is injected into the joint. If the pain is improved or stopped, the diagnosis of sacroiliac joint dysfunction is more likely. TREATMENT  There are a number of types of treatment used for sacroiliac joint dysfunction, including:  Only take over-the-counter or prescription medicines for pain, discomfort, or fever as directed by your caregiver.  Medications to relax muscles.  Rest. Decreasing activity can help cut down on painful muscle spasms and allow the back to heal.  Application of heat or ice to the lower back may improve muscle spasms and soothe  pain.  Brace. A special back brace, called a sacroiliac belt, can help support the joint while your back is healing.  Physical therapy can help teach comfortable positions and exercises to strengthen muscles that support the sacroiliac joint.  Cortisone injections. Injections of steroid medicine into the joint can help decrease swelling and improve pain.  Hyaluronic acid injections. This chemical improves lubrication within the sacroiliac joint, thereby decreasing pain.  Radiofrequency ablation. A special needle is placed into the joint, where it burns away nerves that are carrying pain messages from the joint.  Surgery. Because pain occurs during movement of the joint, screws and plates may be installed in order to limit or prevent joint motion. HOME CARE INSTRUCTIONS   Take all medications exactly as directed.  Follow instructions regarding both rest and physical activity, to avoid worsening the pain.  Do physical therapy exercises exactly as prescribed. SEEK IMMEDIATE MEDICAL CARE IF:  You experience increasingly severe pain.  You develop new symptoms, such as numbness or tingling in your legs or feet.  You lose bladder or bowel control. Document Released: 08/22/2008 Document Revised: 08/18/2011 Document Reviewed: 08/22/2008 [facility] Patient Information 2015 [place], [place]. This information is not intended to replace advice given to you by your health care provider. Make sure you discuss any questions you have with your health care provider.

## 2014-10-24 NOTE — Assessment & Plan Note (Signed)
We decided to defer follow-up of weight loss today as patient preferred to discuss right low back pain. -Return for f/u weight loss.

## 2014-10-24 NOTE — Assessment & Plan Note (Addendum)
History and exam consistent with right SI joint dysfunction with no radiculopathy symptoms, no red flag symptoms, and with tenderness at right SI joint. Positive Faber test. Some muscle spasm on exam. -SI joint dysfunction exercises provided and urged patient PT is mainstay of therapy. -Referral placed for sports medicine to consider SI joint injection. -Can use Tylenol 500mg  twice a day when necessary. -Tizanidine short course prescribed for muscle spasming.  -Heat therapy, ice when necessary -Follow-up in one month if no improvement to reevaluate. Treated with short-courses of opioids in the past but prefer to avoid this as patient has history of drug abuse.

## 2014-10-24 NOTE — Progress Notes (Signed)
Patient ID: Ashley [name], female   DOB: [birth year], 64 y.o.   MRN: [id number] Subjective:   CC: Right low back pain  HPI:   Right low back pain Patient presents to discuss right low back pain that radiates to her posterior leg to mid lateral thigh. She states this has been present for about 2 weeks and has been worsening, making it hard to walk. She denies any falls or increased lifting, and reports this started out of the blue. Denies any swelling, erythema, warmth, fevers, chills, dysuria, numbness, tingling, leg weakness, bowel or bladder incontinence, perineal numbness or tingling. She has never had symptoms like this before. She has not tried any medication. She has not used any massage, heat or ice therapy, or physical therapy.  History of hepatitis C  Patient denies any new symptoms but did not hear anything about her referral to GI for history of hepatitis C.   Review of Systems - Per HPI.   PMH: Tobacco abuse, rotator cuff syndrome right shoulder, poor dentition, MSK pain, moderate malnutrition and weight loss, hypertension, hyperlipidemia, hepatitis C with history of IV drug abuse, gastric ulcer, constipation, history of cocaine abuse, back pain    Objective:  Physical Exam BP 145/89 mmHg  Pulse 85  Temp(Src) 98.1 F (36.7 C) (Oral)  Ht 5\' 2"  (1.575 m)  Wt 133 lb (60.328 kg)  BMI 24.32 kg/m2 GEN: NAD Extremities: Right hip and low back with no deformity, inflammation, or erythema Generalized tenderness to palpation right lumbar and lateral upper thigh, worse at SI joint  Negative straight leg raise bilaterally Positive Faber test right, negative left lower extremity 4+ out of 5 bilateral hip flexion, abduction, and adduction Full hip range of motion with reproduced pain at lumbar and lateral leg on external rotation Mildly antalgic gait HEENT: AT/[place], sclera clear    Assessment:     Ashley [name] is a 64 y.o. female here for right low back pain.    Plan:     # See  problem list and after visit summary for problem-specific plans.   # Health Maintenance: Not discussed  Follow-up: Follow up in 1 month for f/u of low back pain if not improved significantly. Call in 1 week if have not heard about GI referral.  Hilton Sinclair, MD [place]

## 2014-10-24 NOTE — Assessment & Plan Note (Signed)
History of hepatitis C, most recent LFTs normal April 2016. -Referral had been placed to GI at last visit. CMA to evaluate why patient did not receive a call. -Call clinic in  1 week if nothing heard re: referral still.

## 2014-11-07 ENCOUNTER — Ambulatory Visit: Payer: PPO | Admitting: Sports Medicine

## 2014-12-01 ENCOUNTER — Encounter: Payer: PPO | Admitting: Internal Medicine

## 2015-03-21 ENCOUNTER — Encounter (HOSPITAL_COMMUNITY): Payer: Self-pay | Admitting: Family Medicine

## 2015-03-21 ENCOUNTER — Emergency Department (HOSPITAL_COMMUNITY)
Admission: EM | Admit: 2015-03-21 | Discharge: 2015-03-21 | Disposition: A | Discharge status: Home / Self Care | Payer: PPO | Attending: Physician Assistant | Admitting: Physician Assistant

## 2015-03-21 DIAGNOSIS — Z720 Tobacco use: Secondary | ICD-10-CM | POA: Diagnosis not present

## 2015-03-21 DIAGNOSIS — Z8739 Personal history of other diseases of the musculoskeletal system and connective tissue: Secondary | ICD-10-CM | POA: Diagnosis not present

## 2015-03-21 DIAGNOSIS — Z8719 Personal history of other diseases of the digestive system: Secondary | ICD-10-CM | POA: Insufficient documentation

## 2015-03-21 DIAGNOSIS — Z8619 Personal history of other infectious and parasitic diseases: Secondary | ICD-10-CM | POA: Insufficient documentation

## 2015-03-21 DIAGNOSIS — E785 Hyperlipidemia, unspecified: Secondary | ICD-10-CM | POA: Insufficient documentation

## 2015-03-21 DIAGNOSIS — I10 Essential (primary) hypertension: Secondary | ICD-10-CM | POA: Diagnosis not present

## 2015-03-21 DIAGNOSIS — Z79899 Other long term (current) drug therapy: Secondary | ICD-10-CM | POA: Insufficient documentation

## 2015-03-21 DIAGNOSIS — M1990 Unspecified osteoarthritis, unspecified site: Secondary | ICD-10-CM | POA: Insufficient documentation

## 2015-03-21 DIAGNOSIS — R350 Frequency of micturition: Secondary | ICD-10-CM | POA: Diagnosis present

## 2015-03-21 DIAGNOSIS — N390 Urinary tract infection, site not specified: Secondary | ICD-10-CM | POA: Diagnosis not present

## 2015-03-21 LAB — URINALYSIS, ROUTINE W REFLEX MICROSCOPIC
Bilirubin Urine: NEGATIVE
GLUCOSE, UA: NEGATIVE mg/dL
KETONES UR: NEGATIVE mg/dL
NITRITE: NEGATIVE
Protein, ur: >300 mg/dL — AB
SPECIFIC GRAVITY, URINE: 1.011 (ref 1.005–1.030)
Urobilinogen, UA: 0.2 mg/dL (ref 0.0–1.0)
pH: 5.5 (ref 5.0–8.0)

## 2015-03-21 LAB — URINE MICROSCOPIC-ADD ON

## 2015-03-21 MED ORDER — PHENAZOPYRIDINE HCL 200 MG PO TABS: 200.0000 mg | ORAL_TABLET | Freq: Three times a day (TID) | ORAL | Status: DC

## 2015-03-21 MED ORDER — CEPHALEXIN 250 MG PO CAPS
500.0000 mg | ORAL_CAPSULE | Freq: Once | ORAL | Status: AC
  Administered 2015-03-21: 500 mg via ORAL
  Filled 2015-03-21: qty 2

## 2015-03-21 MED ORDER — CEPHALEXIN 500 MG PO CAPS: 500.0000 mg | ORAL_CAPSULE | Freq: Four times a day (QID) | ORAL | Status: DC

## 2015-03-21 MED ORDER — PHENAZOPYRIDINE HCL 100 MG PO TABS
200.0000 mg | ORAL_TABLET | Freq: Once | ORAL | Status: AC
  Administered 2015-03-21: 200 mg via ORAL
  Filled 2015-03-21: qty 2

## 2015-03-21 NOTE — ED Notes (Signed)
The pt is c/o painful urination since yesterday.  No temp no bloody urine seen.  No previous history

## 2015-03-21 NOTE — ED Notes (Signed)
The pt had frequency this am but the frequency has slowed down as the day has  Advanced.  No odor  Clear urine

## 2015-03-21 NOTE — Discharge Instructions (Signed)
Urinary Tract Infection A urinary tract infection (UTI) can occur any place along the urinary tract. The tract includes the kidneys, ureters, bladder, and urethra. A type of germ called bacteria often causes a UTI. UTIs are often helped with antibiotic medicine.  HOME CARE   If given, take antibiotics as told by your doctor. Finish them even if you start to feel better.  Drink enough fluids to keep your pee (urine) clear or pale yellow.  Avoid tea, drinks with caffeine, and bubbly (carbonated) drinks.  Pee often. Avoid holding your pee in for a long time.  Pee before and after having sex (intercourse).  Wipe from front to back after you poop (bowel movement) if you are a woman. Use each tissue only once. GET HELP RIGHT AWAY IF:   You have back pain.  You have lower belly (abdominal) pain.  You have chills.  You feel sick to your stomach (nauseous).  You throw up (vomit).  Your burning or discomfort with peeing does not go away.  You have a fever.  Your symptoms are not better in 3 days. MAKE SURE YOU:   Understand these instructions.  Will watch your condition.  Will get help right away if you are not doing well or get worse.   This information is not intended to replace advice given to you by your health care provider. Make sure you discuss any questions you have with your health care provider.     Take your next dose of your medicines tomorrow morning.  Remember that pyridium will make your urine orange and is normal.  Get rechecked if you develop any worsened symptoms such as fever, nausea, vomiting or worse pain.

## 2015-03-21 NOTE — ED Notes (Signed)
Pt here with pain and burning with urination.

## 2015-03-21 NOTE — ED Provider Notes (Signed)
CSN: [id number]     Arrival date & time 03/21/15  1525 History   First MD Initiated Contact with Patient 03/21/15 1721     Chief Complaint  Patient presents with  . Urinary Frequency     (Consider location/radiation/quality/duration/timing/severity/associated sxs/prior Treatment) The history is provided by the patient.   Ashley [name] is a 64 y.o. female presenting with a 1 day history of increased urinary frequency, burning pain with urination along with urgency.  She denies hematuria, fevers, chills, flank pain, vaginal discharge or vaginal pain or any worsened low back pain although endorses she has low back pain chronically, not worse today.  She has has had no medicines prior to arrival.     Past Medical History  Diagnosis Date  . Ulcer 1990    gastric ulcer. Ruptured s/p emergency repair  . Arthritis   . Pancreatitis 2000    resolved  . 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 Hyperlipidemia   . Hypertension   . Cervical radiculopathy 02/28/2011  . Acute kidney injury ([place]) 05/24/2013  . Cocaine substance abuse 05/26/2013    positive UDS   . Marijuana abuse 12/18/204    positive UDS, family members smoke as well   Past Surgical History  Procedure Laterality Date  . Abdominal hysterectomy  1979  . Repair of perforated ulcer    . Esophagogastroduodenoscopy N/A 05/29/2013    Procedure: ESOPHAGOGASTRODUODENOSCOPY (EGD);  Surgeon: Jerene Bears, MD;  Location: [facility] ENDOSCOPY;  Service: Endoscopy;  Laterality: N/A;   Family History  Problem Relation Age of Onset  . Hypertension Father   . Cancer Father   . Hyperlipidemia Father   . Seizures Sister   . Early death Daughter   . Kidney disease Daughter     end stage dialysis dependent    Social History  Substance Use Topics  . Smoking status: Current Every Day Smoker -- 0.25 packs/day for 40 years    Types: Cigarettes  . Smokeless tobacco: Never Used  . Alcohol Use: No   OB History    No data available      Review of Systems  Constitutional: Negative for fever and chills.  HENT: Negative for congestion and sore throat.   Eyes: Negative.   Respiratory: Negative for chest tightness and shortness of breath.   Cardiovascular: Negative for chest pain.  Gastrointestinal: Negative for nausea and abdominal pain.  Genitourinary: Positive for dysuria, urgency and frequency. Negative for vaginal discharge and vaginal pain.  Musculoskeletal: Negative for joint swelling, arthralgias and neck pain.  Skin: Negative.  Negative for rash and wound.  Neurological: Negative for dizziness, weakness, light-headedness, numbness and headaches.  Psychiatric/Behavioral: Negative.       Allergies  Aspirin and Ibuprofen  Home Medications   Prior to Admission medications   Medication Sig Start Date End Date Taking? Authorizing Provider  acetaminophen (TYLENOL) 500 MG tablet Take 1,000 mg by mouth daily as needed for mild pain.   Yes Historical Provider, MD  amLODipine (NORVASC) 10 MG tablet Take 1 tablet (10 mg total) by mouth daily. 10/12/14  Yes Hilton Sinclair, MD  atorvastatin (LIPITOR) 80 MG tablet Take 80 mg by mouth daily. 01/08/15  Yes Historical Provider, MD  polyethylene glycol (MIRALAX / GLYCOLAX) packet Take 17 g by mouth 2 (two) times daily. Until stooling regularly 09/19/14  Yes Hilton Sinclair, MD  cephALEXin (KEFLEX) 500 MG capsule Take 1 capsule (500 mg total) by mouth 4 (four) times daily. 03/21/15  Evalee Jefferson, PA-C  nicotine (NICODERM CQ - DOSED IN MG/24 HOURS) 14 mg/24hr patch Place 1 patch (14 mg total) onto the skin daily. For 6 weeks. After that, will transition to 7mg  daily for 2 weeks 10/12/14   Hilton Sinclair, MD  phenazopyridine (PYRIDIUM) 200 MG tablet Take 1 tablet (200 mg total) by mouth 3 (three) times daily. 03/21/15   Evalee Jefferson, PA-C   BP 159/86 mmHg  Pulse 71  Temp(Src) 98.6 F (37 C)  Resp 18  SpO2 98% Physical Exam  Constitutional: She appears well-developed  and well-nourished.  HENT:  Head: Normocephalic.  Eyes: Conjunctivae are normal.  Neck: Normal range of motion. Neck supple.  Cardiovascular: Normal rate and intact distal pulses.   Pedal pulses normal.  Pulmonary/Chest: Effort normal.  Abdominal: Soft. Bowel sounds are normal. She exhibits no distension and no mass. There is no tenderness. There is no rebound, no guarding and no CVA tenderness.  Musculoskeletal: Normal range of motion. She exhibits no edema.       Lumbar back: She exhibits tenderness. She exhibits no swelling, no edema and no spasm.  Neurological: She is alert. She has normal strength. She displays no atrophy and no tremor. No sensory deficit. Gait normal.  Reflex Scores:      Patellar reflexes are 2+ on the right side and 2+ on the left side.      Achilles reflexes are 2+ on the right side and 2+ on the left side. No strength deficit noted in hip and knee flexor and extensor muscle groups.  Ankle flexion and extension intact.  Skin: Skin is warm and dry.  Psychiatric: She has a normal mood and affect.  Nursing note and vitals reviewed.   ED Course  Procedures (including critical care time) Labs Review Labs Reviewed  URINALYSIS, ROUTINE W REFLEX MICROSCOPIC (NOT AT [facility]) - Abnormal; Notable for the following:    APPearance CLOUDY (*)    Hgb urine dipstick MODERATE (*)    Protein, ur >300 (*)    Leukocytes, UA TRACE (*)    All other components within normal limits  URINE MICROSCOPIC-ADD ON - Abnormal; Notable for the following:    Squamous Epithelial / LPF FEW (*)    All other components within normal limits  URINE CULTURE    Imaging Review No results found. I have personally reviewed and evaluated these images and lab results as part of my medical decision-making.   EKG Interpretation None      MDM   Final diagnoses:  UTI (lower urinary tract infection)    Patient with UTI, urine culture was ordered.  She was placed on Keflex and Pyridium first  dose is given here.  She is encouraged to follow-up with her PCP for repeat urinalysis per lab visit after the antibiotics are completed, of course recheck either here or with her primary doctor for any worsening symptoms including fevers, pain, nausea or vomiting.    Evalee Jefferson, PA-C 03/21/15 1821  Courteney Julio Alm, MD 03/21/15 [id number]

## 2015-03-24 LAB — URINE CULTURE
Culture: 10000
SPECIAL REQUESTS: NORMAL

## 2015-03-25 ENCOUNTER — Telehealth (HOSPITAL_BASED_OUTPATIENT_CLINIC_OR_DEPARTMENT_OTHER): Payer: Self-pay | Admitting: Emergency Medicine

## 2015-03-25 NOTE — Telephone Encounter (Signed)
Post ED Visit - Positive Culture Follow-up  Culture report reviewed by antimicrobial stewardship pharmacist:  []  Heide Guile, Pharm.D., BCPS []  Alycia Rossetti, Pharm.D., BCPS []  [place], Pharm.D., BCPS, AAHIVP []  Legrand Como, Pharm.D., BCPS, AAHIVP []  [place], Pharm.D. []  Cassie Teachey, [place].D. Anette Guarneri PharmD  Positive urine culture E. Coli Treated with cephalexin, organism sensitive to the same and no further patient follow-up is required at this time.  Hazle Nordmann 03/25/2015, 11:50 AM

## 2015-04-11 ENCOUNTER — Ambulatory Visit (INDEPENDENT_AMBULATORY_CARE_PROVIDER_SITE_OTHER): Payer: PPO | Admitting: Family Medicine

## 2015-04-11 ENCOUNTER — Encounter: Payer: Self-pay | Admitting: Family Medicine

## 2015-04-11 VITALS — BP 138/80 | HR 71 | Temp 98.3°F | Ht 62.0 in | Wt 138.0 lb

## 2015-04-11 DIAGNOSIS — R300 Dysuria: Secondary | ICD-10-CM

## 2015-04-11 DIAGNOSIS — N390 Urinary tract infection, site not specified: Secondary | ICD-10-CM

## 2015-04-11 DIAGNOSIS — R319 Hematuria, unspecified: Secondary | ICD-10-CM

## 2015-04-11 LAB — POCT URINALYSIS DIPSTICK
BILIRUBIN UA: NEGATIVE
GLUCOSE UA: NEGATIVE
KETONES UA: NEGATIVE
Nitrite, UA: POSITIVE
Protein, UA: >=300
Spec Grav, UA: 1.025
Urobilinogen, UA: 0.2
pH, UA: 7

## 2015-04-11 LAB — POCT UA - MICROSCOPIC ONLY

## 2015-04-11 MED ORDER — SULFAMETHOXAZOLE-TRIMETHOPRIM 800-160 MG PO TABS: 1.0000 | ORAL_TABLET | Freq: Two times a day (BID) | ORAL | Status: DC

## 2015-04-11 NOTE — Patient Instructions (Signed)
Thank you so much for coming to see me today! I have sent in an antibiotic for you to take twice a day for ten days. Please let me know if you experience any adverse effects or if this medication is ineffective. I will let you know if we need to change antibiotics based on the culture.  Thanks again! Dr. Gerlean Ren

## 2015-04-14 DIAGNOSIS — N390 Urinary tract infection, site not specified: Secondary | ICD-10-CM | POA: Insufficient documentation

## 2015-04-14 LAB — URINE CULTURE

## 2015-04-14 NOTE — Assessment & Plan Note (Signed)
-   Urine with 3+ rods, cloudy, moderate leukocytes, positive nitrites, loaded WBC - Urine culture sent, Klebsiella speciated, sensitive to Bactrim - Will avoid Keflex given recent treatment failure - Prescription for Bactrim given to complete ten day course

## 2015-04-14 NOTE — Progress Notes (Signed)
Subjective:     Patient ID: Ashley [name], female   DOB: [birth year], 64 y.o.   MRN: [id number]  HPI Mrs. Mosbey is a 64yo female presenting today for possible UTI. # DYSURIA  Pain urinating started 3 weeks ago. Pain is: suprapubic Medications tried: Keflex and Pyridium. States symptoms improved with course of Keflex, but never fully resolved. Prescribed Keflex 500mg  qid for five days on 03/21/15. More than 3 UTIs in the last 12 months: No, last UTI several years ago STD exposure: No  Symptoms Urgency: Yes Frequency: Yes Change in Odor: Yes Blood in urine: No Pain in back: Yes, Mid-back, Denies flank pain Fever: No Vaginal discharge: No Mouth Ulcers: No  Review of Symptoms - see HPI PMH - Smoking status noted.    Review of Systems Per HPI    Objective:   Physical Exam  Constitutional: She appears well-developed and well-nourished. No distress.  Cardiovascular: Normal rate and regular rhythm.  Exam reveals no gallop and no friction rub.   No murmur heard. Pulmonary/Chest: Effort normal. No respiratory distress. She has no wheezes.  Abdominal: Soft. Bowel sounds are normal. She exhibits no distension.  Suprapubic tenderness, No CVA tenderness noted  Musculoskeletal: She exhibits no edema.  Skin: No rash noted.  Psychiatric: She has a normal mood and affect.      Assessment and Plan:     UTI (urinary tract infection) - Urine with 3+ rods, cloudy, moderate leukocytes, positive nitrites, loaded WBC - Urine culture sent, Klebsiella speciated, sensitive to Bactrim - Will avoid Keflex given recent treatment failure - Prescription for Bactrim given to complete ten day course

## 2015-07-26 ENCOUNTER — Telehealth: Payer: Self-pay | Admitting: *Deleted

## 2015-07-26 NOTE — Telephone Encounter (Signed)
Called patient to offer flu vaccine, however, VM picked up.  Left message on patient's voice mail to return call. Victoria Henshaw L, RN   

## 2015-09-15 ENCOUNTER — Emergency Department (HOSPITAL_COMMUNITY)
Admission: EM | Admit: 2015-09-15 | Discharge: 2015-09-15 | Disposition: A | Discharge status: Home / Self Care | Payer: PPO | Attending: Emergency Medicine | Admitting: Emergency Medicine

## 2015-09-15 ENCOUNTER — Encounter (HOSPITAL_COMMUNITY): Payer: Self-pay | Admitting: Emergency Medicine

## 2015-09-15 DIAGNOSIS — Z79899 Other long term (current) drug therapy: Secondary | ICD-10-CM | POA: Insufficient documentation

## 2015-09-15 DIAGNOSIS — Z8619 Personal history of other infectious and parasitic diseases: Secondary | ICD-10-CM | POA: Insufficient documentation

## 2015-09-15 DIAGNOSIS — F1120 Opioid dependence, uncomplicated: Secondary | ICD-10-CM | POA: Diagnosis not present

## 2015-09-15 DIAGNOSIS — F17210 Nicotine dependence, cigarettes, uncomplicated: Secondary | ICD-10-CM | POA: Diagnosis not present

## 2015-09-15 DIAGNOSIS — Z872 Personal history of diseases of the skin and subcutaneous tissue: Secondary | ICD-10-CM | POA: Insufficient documentation

## 2015-09-15 DIAGNOSIS — I10 Essential (primary) hypertension: Secondary | ICD-10-CM | POA: Diagnosis not present

## 2015-09-15 DIAGNOSIS — Z87448 Personal history of other diseases of urinary system: Secondary | ICD-10-CM | POA: Insufficient documentation

## 2015-09-15 DIAGNOSIS — E785 Hyperlipidemia, unspecified: Secondary | ICD-10-CM | POA: Diagnosis not present

## 2015-09-15 DIAGNOSIS — F1123 Opioid dependence with withdrawal: Secondary | ICD-10-CM | POA: Diagnosis present

## 2015-09-15 DIAGNOSIS — Z8719 Personal history of other diseases of the digestive system: Secondary | ICD-10-CM | POA: Diagnosis not present

## 2015-09-15 DIAGNOSIS — Z8739 Personal history of other diseases of the musculoskeletal system and connective tissue: Secondary | ICD-10-CM | POA: Diagnosis not present

## 2015-09-15 MED ORDER — ONDANSETRON 8 MG PO TBDP: 8.0000 mg | ORAL_TABLET | Freq: Three times a day (TID) | ORAL | Status: DC | PRN

## 2015-09-15 MED ORDER — ONDANSETRON 8 MG PO TBDP
8.0000 mg | ORAL_TABLET | Freq: Once | ORAL | Status: AC
  Administered 2015-09-15: 8 mg via ORAL
  Filled 2015-09-15: qty 1

## 2015-09-15 MED ORDER — CLONIDINE HCL 0.2 MG PO TABS: 0.2000 mg | ORAL_TABLET | Freq: Every day | ORAL | Status: DC

## 2015-09-15 MED ORDER — CLONIDINE HCL 0.1 MG PO TABS
0.1000 mg | ORAL_TABLET | Freq: Once | ORAL | Status: AC
  Administered 2015-09-15: 0.1 mg via ORAL
  Filled 2015-09-15: qty 1

## 2015-09-15 NOTE — ED Provider Notes (Signed)
CSN: [id number]     Arrival date & time 09/15/15  1506 History   First MD Initiated Contact with Patient 09/15/15 1542     Chief Complaint  Patient presents with  . 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 Last use was yesterday. Note some myalgias and emesis which is since resolved. Denies any suicidal or homicidal ideations. Denies any concurrent alcohol use. Went to [facility] and was sent to for further evaluation. Denies being in rehabilitation before. Denies any concurrent psychiatric illness  The history is provided by the patient.    Past Medical History  Diagnosis Date  . Ulcer 1990    gastric ulcer. Ruptured s/p emergency repair  . Arthritis   . Pancreatitis 2000    resolved  . 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 Hyperlipidemia   . Hypertension   . Cervical radiculopathy 02/28/2011  . Acute kidney injury ([place]) 05/24/2013  . Cocaine substance abuse 05/26/2013    positive UDS   . Marijuana abuse 12/18/204    positive UDS, family members smoke as well   Past Surgical History  Procedure Laterality Date  . Abdominal hysterectomy  1979  . Repair of perforated ulcer    . Esophagogastroduodenoscopy N/A 05/29/2013    Procedure: ESOPHAGOGASTRODUODENOSCOPY (EGD);  Surgeon: Jerene Bears, MD;  Location: [facility] ENDOSCOPY;  Service: Endoscopy;  Laterality: N/A;   Family History  Problem Relation Age of Onset  . Hypertension Father   . Cancer Father   . Hyperlipidemia Father   . Seizures Sister   . Early death Daughter   . Kidney disease Daughter     end stage dialysis dependent    Social History  Substance Use Topics  . Smoking status: Current Every Day Smoker -- 0.25 packs/day for 40 years    Types: Cigarettes  . Smokeless tobacco: Never Used  . Alcohol Use: No   OB History    No data available     Review of Systems  All other systems  reviewed and are negative.     Allergies  Aspirin and Ibuprofen  Home Medications   Prior to Admission medications   Medication Sig Start Date End Date Taking? Authorizing Provider  amLODipine (NORVASC) 10 MG tablet Take 1 tablet (10 mg total) by mouth daily. 10/12/14  Yes Hilton Sinclair, MD  atorvastatin (LIPITOR) 80 MG tablet Take 80 mg by mouth daily. 01/08/15  Yes Historical Provider, MD  cephALEXin (KEFLEX) 500 MG capsule Take 1 capsule (500 mg total) by mouth 4 (four) times daily. Patient not taking: Reported on 09/15/2015 03/21/15   Evalee Jefferson, PA-C  nicotine (NICODERM CQ - DOSED IN MG/24 HOURS) 14 mg/24hr patch Place 1 patch (14 mg total) onto the skin daily. For 6 weeks. After that, will transition to 7mg  daily for 2 weeks Patient not taking: Reported on 09/15/2015 10/12/14   Hilton Sinclair, MD  phenazopyridine (PYRIDIUM) 200 MG tablet Take 1 tablet (200 mg total) by mouth 3 (three) times daily. Patient not taking: Reported on 09/15/2015 03/21/15   Evalee Jefferson, PA-C  polyethylene glycol (MIRALAX / GLYCOLAX) packet Take 17 g by mouth 2 (two) times daily. Until stooling regularly Patient not taking: Reported on 09/15/2015 09/19/14   Hilton Sinclair, MD  sulfamethoxazole-trimethoprim (BACTRIM DS,SEPTRA DS) 800-160 MG tablet Take 1 tablet by mouth 2 (two) times daily. Patient not taking: Reported on 09/15/2015 04/11/15   Lorna Few,  DO   BP 149/96 mmHg  Pulse 78  Temp(Src) 97.9 F (36.6 C) (Oral)  Resp 18  SpO2 100% Physical Exam  Constitutional: She is oriented to person, place, and time. She appears well-developed and well-nourished.  Non-toxic appearance. No distress.  HENT:  Head: Normocephalic and atraumatic.  Eyes: Conjunctivae, EOM and lids are normal. Pupils are equal, round, and reactive to light.  Neck: Normal range of motion. Neck supple. No tracheal deviation present. No thyroid mass present.  Cardiovascular: Normal rate, regular rhythm and normal heart  sounds.  Exam reveals no gallop.   No murmur heard. Pulmonary/Chest: Effort normal and breath sounds normal. No stridor. No respiratory distress. She has no decreased breath sounds. She has no wheezes. She has no rhonchi. She has no rales.  Abdominal: Soft. Normal appearance and bowel sounds are normal. She exhibits no distension. There is no tenderness. There is no rebound and no CVA tenderness.  Musculoskeletal: Normal range of motion. She exhibits no edema or tenderness.  Neurological: She is alert and oriented to person, place, and time. She has normal strength. No cranial nerve deficit or sensory deficit. GCS eye subscore is 4. GCS verbal subscore is 5. GCS motor subscore is 6.  Skin: Skin is warm and dry. No abrasion and no rash noted.  Psychiatric: She has a normal mood and affect. Her speech is normal and behavior is normal. She expresses no suicidal plans and no homicidal plans.  Nursing note and vitals reviewed.   ED Course  Procedures (including critical care time) Labs Review Labs Reviewed - No data to display  Imaging Review No results found. I have personally reviewed and evaluated these images and lab results as part of my medical decision-making.   EKG Interpretation None      MDM   Final diagnoses:  None    Patient given meds opiate withdrawal here as well as resources. She has no acute psychiatric illness at this time.    Lacretia Leigh, MD 09/15/15 [phone]

## 2015-09-15 NOTE — ED Notes (Signed)
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 Last use 24 hours ago. Pt denies using any other substances. Pt having chills, aches, weakness, nausea and HA. Denies SI/HI.

## 2015-09-15 NOTE — ED Notes (Signed)
Case management unable to pay for prescriptions due to insurance status that will help cover medications.

## 2015-09-15 NOTE — ED Notes (Signed)
Bed: [facility]4 Expected date:  Expected time:  Means of arrival:  Comments: EMS- Heroin detox

## 2015-09-15 NOTE — Discharge Instructions (Signed)
Community Resource Guide Outpatient Counseling/Substance Abuse Adult °The United Way’s “211” is a great source of information about community services available.  Access by dialing 2-1-1 from anywhere in [place], or by website -  www.nc211.org.  ° °Other Local Resources (Updated 06/2015) ° °Crisis Hotlines °  °Services  ° °  °Area Served  °[facility] • Crisis Hotline, available 24 hours a day, 7 days a week: 800-939-5911 [place], [place]  ° [facility] • Crisis Hotline, available 24 hours a day, 7 days a week: 866-275-9552 [place], [place]  °[facility] • Suicide Prevention Hotline, available 24 hours a day, 7 days a week: 800-273-8255 [place], [place]  °[facility] ° • Crisis Hotline, available 24 hours a day, 7 days a week: 336-676-6840 [place], [place] °  °[facility] • Crisis Hotline, available 24 hours a day, 7 days a week: 800-256-2452 All °  °[facility] • Crisis Hotline, available 24 hours a day, 7 days a week: 877-626-1772 [place]  ° °Other Local Resources (Updated 06/2015) ° °Outpatient Counseling/ Substance Abuse Programs  °Services  ° °  °Address and Phone Number  °[facility] ° • Options include Individual counseling, group counseling, intensive outpatient program (several hours a day, several days a week) °• Offers depression assessments °• Provides methadone maintenance program 336-333-6860 °[address], [address] °[place], [place] [address] °  °[facility] ° • Offers partial hospitalization/day treatment and DUI/DWI programs °• Accepts Medicare, private insurance 336-299-4655 °[address], [address] °[place], [place] [address]  °[facility] ° ° • Services include intensive outpatient program (several hours a day, several days a week), outpatient treatment, DUI/DWI services, family education °• Also has some services specifically for Veterans °• Offers transitional housing   336-886-5594 °[address] °[place], [place] [address] °  °  °[facility] • Accepts Medicare, private pay, and private insurance 336-272-0855 °[address] °[place], [place] [address]  °[facility] • Services include individual counseling, substance abuse intensive outpatient program (several hours a day, several days a week), day treatment °• Accepts Medicare, Medicaid, private insurance 336-271-5888 °[address] °[place], [place] [address]  °[facility] ° • Offers substance abuse intensive outpatient program (several hours a day, several days a week), partial hospitalization program 336-832-9800 °[address] °[place], [place] [address] ° °336-349-4454 °[address] °[place], [place] [address] ° °336-386-3795 °[address] °[place], [place] [address] ° °336-993-6120 °[address] °[place], [place] [address]  °[facility] • Individual counseling only °• Accepts private insurance only 336-292-1510 °[address] °[place], [place] [address]  °[facility] • Methadone maintenance program 800-805-6989 °[address] °[place], [place] [address]  °[facility] • Walk-In Clinic providing substance abuse and mental health counseling °• Accepts Medicaid, Medicare, private insurance °• Offers sliding scale for uninsured 336-342-8316 °[address] °[place], [place]   °[facility] • Offers individual counseling, and intensive in-home services 336-347-7415 °[address] °[place], [place] [address]  °[facility] • Offers individual counseling, family counseling, group therapy, domestic violence counseling, consumer credit counseling °• Accepts Medicare, Medicaid, private insurance °• Offers sliding scale for uninsured 336-387-6161 °[address] °, [place] [address] ° °336-889-6161 °[place], [address] °[place],  [address]  °[facility] • Offers individual, family   and group counseling °• 3 locations - [place], [place], and [place] ° 336-899-8800 ° °[address] °[place], [place] [address] ° °[address] °[place], [place] [address] ° °[address] °[place], [place] [address]  °[facility]  ° • Offers psychiatric assessment, 8-week Intensive Outpatient Program (several hours a day, several times a week, daytime or evenings), early recovery group, family Program, medication management °• Private pay or private insurance only 336 -621-3381, or  °800-659-3381 °[address] °[place], [place] [address]  °[facility] • Offers individual, couples and family counseling °• Accepts Medicaid, private insurance, and sliding scale for uninsured 336-542-2076 °[address] °[place], [place] [address]  °David Fuller, [facility] • Individual counseling °• Private insurance 336-852-4051 °[address] °[place], [place] [address]  °[facility] ° • Offers assessment, substance abuse treatment, and behavioral health treatment 336-878-6098 °[address] °[place], [place] [address]  °[facility] • Individual counseling °• Accepts private insurance 336-272-1972 °[address] °[place], [place] [address]  °[facility] • Individual counseling °• Accepts Medicare, private insurance 336-547-1574 °[address] °[place], [place] [address]  °[facility]  ° • Offers intensive outpatient program (several hours a day, several times a week) °• Private pay, private insurance [phone] °[address] °[place], [place]  °[facility] • Individual counseling °• Medicare, private insurance 336-505-9494 °[address] °[place], [place] [address]  °[facility]  ° • Offers intensive outpatient program (several hours a day, several times a week) and partial hospitalization  program 336-794-3550 °[address] °[place], [place] [address]  °Parrish McKinney, MD • Individual counseling 336-282-1251 °[address] °[place], [place] [address]  °[facility] • Offers Christian counseling to individuals, couples, and families °• Accepts Medicare and private insurance; offers sliding scale for uninsured 336-288-1484 °[address] °[place], [place] [address]  °[facility] • Christian counseling 336-542-2060 °[address] °[place], [place]  [address]  °[facility] ° • Offers crisis counseling, individual counseling, group therapy, in-home therapy, domestic violence services, day treatment, DWI services, Community Support Team (CST), Assertive Community Treatment Team (ACTT), substance abuse Intensive Outpatient Program (several hours a day, several times a week) °• 2 locations - [place] and [place] 336-229-5905 °[address] °[place], [place] [address] ° °336-694-1777 °[address] °[place], [place] [address]  °[facility]  ° ° • Individual counseling and group therapy °• Accepts private insurance, Medicare, Medicaid 336-379-7146 °[address] °[place], [place]  °[facility] • Offers individual and family counseling °• Offers LGBTQ services °• Accepts private insurance and private pay 336-288-9190 °[address] °[place], [place] [address]  °[facility]  ° • Offers individual counseling, group therapy, and outpatient detox °• Accepts private insurance 336-389-1413 °[address] °[place], [place]  °[facility] • Individual counseling °• Accepts Medicare, private insurance 336-632-3505 °[address] °[place], [place] [address]  °[facility] • Individual counseling °• Accepts Medicare, private insurance 336-570-0104 °[address] °, [place] [address]  °[facility] ° • Offers substance abuse  Intensive Outpatient Program (several hours a day, several times a week) 336-323-1385, or °[phone] °,   ° °

## 2015-09-15 NOTE — Care Management (Signed)
CM spoke with Gennette Pac Nurse who is requesting assistance with the cost of the patient's Clonidine and Zofran. Informed the patient has insurance. Unable to use MATCH. Presenter, broadcasting BSN CCM

## 2015-09-16 ENCOUNTER — Telehealth: Payer: Self-pay | Admitting: Obstetrics and Gynecology

## 2015-09-16 NOTE — Progress Notes (Signed)
NCM did follow up with ED RN, Apolonio Schneiders. Pt was given Rx to take to her pharmacy. Contacted pt to follow up on Rx. States she does have Rx to take to pharmacy. Jonnie Finner RN CCM Case Mgmt phone [phone]

## 2015-10-09 ENCOUNTER — Other Ambulatory Visit: Payer: Self-pay | Admitting: Family Medicine

## 2016-01-15 ENCOUNTER — Ambulatory Visit: Payer: PPO | Admitting: *Deleted

## 2016-01-29 ENCOUNTER — Ambulatory Visit: Payer: Medicare Other | Admitting: *Deleted

## 2016-01-29 ENCOUNTER — Encounter: Payer: Self-pay | Admitting: Internal Medicine

## 2016-01-29 ENCOUNTER — Ambulatory Visit (INDEPENDENT_AMBULATORY_CARE_PROVIDER_SITE_OTHER): Payer: Medicare Other | Admitting: Internal Medicine

## 2016-01-29 VITALS — BP 149/75 | HR 83 | Temp 98.1°F | Ht 62.5 in | Wt 123.4 lb

## 2016-01-29 DIAGNOSIS — R300 Dysuria: Secondary | ICD-10-CM

## 2016-01-29 DIAGNOSIS — N3000 Acute cystitis without hematuria: Secondary | ICD-10-CM | POA: Diagnosis not present

## 2016-01-29 LAB — POCT URINALYSIS DIPSTICK
Bilirubin, UA: NEGATIVE
Glucose, UA: NEGATIVE
Ketones, UA: NEGATIVE
Nitrite, UA: POSITIVE
PH UA: 6
SPEC GRAV UA: 1.02
UROBILINOGEN UA: 0.2

## 2016-01-29 LAB — POCT UA - MICROSCOPIC ONLY

## 2016-01-29 MED ORDER — CIPROFLOXACIN HCL 500 MG PO TABS: 500.0000 mg | ORAL_TABLET | Freq: Two times a day (BID) | ORAL | 0 refills | Status: DC

## 2016-01-29 MED ORDER — PHENAZOPYRIDINE HCL 100 MG PO TABS: 100.0000 mg | ORAL_TABLET | Freq: Three times a day (TID) | ORAL | 0 refills | Status: DC | PRN

## 2016-01-29 NOTE — Progress Notes (Signed)
   Subjective:    Patient ID: Ashley [name], female    DOB: 03-28-1951, 65 y.o.   MRN: [id number]  HPI  Patient presents for same day appt for dysuria.   Dysuria Reporting dysuria for two weeks. Had an appt scheduled last week but was unable to come as she did not have a ride. Also endorses R flank pain and increased urinary frequency. Denies hematuria, abdominal pain, nausea, vomiting, fevers, chills. Has not taken anything to help with pain. Has had UTIs in the past and said her current symptoms feel similar.   Review of Systems See HPI.     Objective:   Physical Exam  Constitutional: She is oriented to person, place, and time. She appears well-developed and well-nourished. No distress.  HENT:  Head: Normocephalic and atraumatic.  Eyes: EOM are normal.  Pulmonary/Chest: Effort normal. No respiratory distress.  Abdominal: Soft. Bowel sounds are normal. She exhibits no distension. There is no tenderness.  Positive R CVA tenderness  Neurological: She is alert and oriented to person, place, and time.  Skin: Skin is warm and dry.  Psychiatric: She has a normal mood and affect. Her behavior is normal.  Vitals reviewed.     Assessment & Plan:  UTI (urinary tract infection) UA with moderate hemoglobin, positive nitrites, trace leuks. Positive R CVA tenderness. Afebrile and well-appearing in office, so outpatient treatment appropriate.  - Cipro 500mg  BID x10d - Pyridium PRN - Return if no improvement in one week, or if develops fevers, vomiting, abdominal pain  Adin Hector, MD, MPH PGY-2 [facility] Pager [phone]

## 2016-01-29 NOTE — Patient Instructions (Addendum)
It was nice meeting you today Ms. Klopf!  For your urinary tract infection, please begin taking ciprofloxacin (antibiotic) twice a day for the next 10 days. It will cost $4 at [facility].   You can also take pyridium up to three times a day as needed for burning with urination.  If you begin to develop fevers, severe abdominal pain, or vomiting, please call our office or go to the emergency room.  If your symptoms have not improved in one week, please call to schedule another appointment.   If you have any questions or concerns, please feel free to call the clinic.   Be well,  Dr. Avon Gully

## 2016-01-29 NOTE — Assessment & Plan Note (Signed)
UA with moderate hemoglobin, positive nitrites, trace leuks. Positive R CVA tenderness. Afebrile and well-appearing in office, so outpatient treatment appropriate.  - Cipro 500mg  BID x10d - Pyridium PRN - Return if no improvement in one week, or if develops fevers, vomiting, abdominal pain

## 2016-01-29 NOTE — Addendum Note (Signed)
Addended by: [place] Pink on: 01/29/2016 12:12 PM   Modules accepted: Orders

## 2016-02-04 ENCOUNTER — Ambulatory Visit: Payer: Medicare Other | Admitting: Internal Medicine

## 2016-03-04 ENCOUNTER — Ambulatory Visit: Payer: Medicare Other | Admitting: *Deleted

## 2016-03-11 ENCOUNTER — Ambulatory Visit: Payer: Medicare Other | Admitting: *Deleted

## 2016-04-08 ENCOUNTER — Ambulatory Visit: Payer: Medicare Other | Admitting: Family Medicine

## 2016-04-10 ENCOUNTER — Ambulatory Visit: Payer: Medicare Other | Admitting: Family Medicine

## 2016-04-15 ENCOUNTER — Ambulatory Visit (INDEPENDENT_AMBULATORY_CARE_PROVIDER_SITE_OTHER): Payer: Medicare Other | Admitting: Family Medicine

## 2016-04-15 ENCOUNTER — Encounter: Payer: Self-pay | Admitting: Family Medicine

## 2016-04-15 VITALS — BP 146/64 | HR 78 | Temp 98.1°F | Ht 63.0 in | Wt 123.0 lb

## 2016-04-15 DIAGNOSIS — F1190 Opioid use, unspecified, uncomplicated: Secondary | ICD-10-CM | POA: Diagnosis not present

## 2016-04-15 DIAGNOSIS — Z23 Encounter for immunization: Secondary | ICD-10-CM | POA: Diagnosis not present

## 2016-04-15 MED ORDER — CLONIDINE HCL 0.1 MG/24HR TD PTWK: 0.1000 mg | MEDICATED_PATCH | TRANSDERMAL | 12 refills | Status: DC

## 2016-04-15 MED ORDER — CLONIDINE HCL 0.1 MG/24HR TD PTWK: 0.1000 mg | MEDICATED_PATCH | TRANSDERMAL | Status: DC

## 2016-04-15 NOTE — Assessment & Plan Note (Addendum)
Clonidine 0.1mg  patch x1 prescribed to help with withdrawal symptoms.  Patient appears highly motivated to pursue sobriety. Integrated care spoke with patient today and offered her outpatient options. Please see Jeanett Schlein Robb's note.

## 2016-04-15 NOTE — Progress Notes (Signed)
Dr. Shawna Orleans requested a [facility].   Presenting Issue:  Patient would like to stop opioid use  Assessment / Plan / Recommendations: Patient expresses high motivation for wanting to stop opioid use. Current barrier to receiving treatment is her sister, who is intellectually disabled, and lives with the patient. Patient states that her sister goes to a program on Mondays, Tuesdays, and Thursdays, and so those days she has more flexibility. [facility] helped patient create an action plan, given patient's readiness to receive help. Patient plans to attend walk-in hours at [facility] [place] next Monday at 12:30pm. Patient states that if she is able to recover from her opioid use, she will have more money and will be able to do more things that she enjoys. [facility] will follow up by phone on Tuesday to see how it goes.

## 2016-04-15 NOTE — Patient Instructions (Signed)
It was great to meet you today, thank you for sharing your story with me.  I hope that speaking to our behavioral health team about outpatient rehab was helpful to you, and I truly hope that you find an option that works for you.  I have sent in a clonidine patch that stays on for 1 week to help take the edge off your withdrawal symptoms.    Dr. Bufford Lope, [place]

## 2016-04-15 NOTE — Progress Notes (Signed)
    Subjective:  Ashley [name] is a 65 y.o. female who presents to the [facility] today with a chief complaint of hot flashes.   HPI: #Heroin use Is seeking help for heroin use. States has had issues with chronic back pain that led her to first seek opiate pills but has lately been using heroin intermittently. Has noticed that approximately 2-3 days after using heroin will get hot flashes, sweats, diarrhea, nausea and overall feeling sick. This resolves when she resumes heroin use. States is very motivated to get sober, needs help with withdrawal symptoms and a place to go for outpatient rehab. Inpatient rehab is not an option since her sister is mentally delayed and she is the primary caretaker without other familial support.   ROS: Per HPI  Objective:  Physical Exam: BP (!) 146/64   Pulse 78   Temp 98.1 F (36.7 C) (Oral)   Ht 5\' 3"  (1.6 m)   Wt 123 lb (55.8 kg)   BMI 21.79 kg/m   Gen: NAD, resting comfortably CV: RRR with no murmurs appreciated Pulm: NWOB, CTAB with no crackles, wheezes, or rhonchi Skin: warm, dry Neuro: grossly normal, moves all extremities Psych: Normal affect and thought content   Assessment/Plan:  Heroin use Clonidine 0.1mg  patch x1 prescribed to help with withdrawal symptoms.  Patient appears highly motivated to pursue sobriety. Integrated care spoke with patient today and offered her outpatient options. Please see Jeanett Schlein Robb's note.   Bufford Lope, DO PGY-1, [facility] 04/15/2016 4:01 PM

## 2016-04-22 ENCOUNTER — Telehealth: Payer: Self-pay

## 2016-04-22 NOTE — Telephone Encounter (Addendum)
[facility] called to follow up with patient about going to [facility] Patient states that she has not gone. She has been feeling very fatigued and states that medications have not been helping her enough. She is hoping to have more energy by the end of the week and will try to go to [facility] on Friday. Patient reminded that walk-in hours for outpatient program are MWF 12:30-3. She would appreciate a check in call from [facility] on Friday. I told her that Caroleen Hamman could call to check in on Friday.

## 2016-04-25 ENCOUNTER — Telehealth: Payer: Self-pay | Admitting: Psychology

## 2016-04-25 NOTE — Telephone Encounter (Signed)
[facility] Caroleen Hamman) called to inquire whether patient was able to access [facility] ([facility]). [facility] left voicemail and will plan to give patient a follow-up call on Monday 11/20

## 2016-04-29 ENCOUNTER — Telehealth: Payer: Self-pay

## 2016-04-29 NOTE — Telephone Encounter (Signed)
[facility] calling to check in about patient's plan to go to [facility]. Patient states she has been unable to get a ride, though still feels it is very important for her to go to [facility]. She plans to look into riding the bus and going next week. States she would appreciate a followup call from [facility] in two weeks.

## 2016-05-05 ENCOUNTER — Ambulatory Visit: Payer: Medicare Other | Admitting: Student

## 2016-05-20 ENCOUNTER — Ambulatory Visit: Payer: Medicare Other | Admitting: Obstetrics and Gynecology

## 2016-05-29 ENCOUNTER — Ambulatory Visit: Payer: Medicare Other | Admitting: Obstetrics and Gynecology

## 2016-06-04 ENCOUNTER — Ambulatory Visit: Payer: Medicare Other | Admitting: Obstetrics and Gynecology

## 2016-06-16 ENCOUNTER — Ambulatory Visit: Payer: Medicare Other | Admitting: Obstetrics and Gynecology

## 2016-06-20 ENCOUNTER — Ambulatory Visit: Payer: Medicare Other | Admitting: Obstetrics and Gynecology

## 2016-10-31 ENCOUNTER — Other Ambulatory Visit: Payer: Self-pay | Admitting: Obstetrics and Gynecology

## 2016-10-31 MED ORDER — AMLODIPINE BESYLATE 10 MG PO TABS: 10.0000 mg | ORAL_TABLET | Freq: Every day | ORAL | 0 refills | Status: DC

## 2016-10-31 NOTE — Telephone Encounter (Signed)
Pt calling to request refill of:  Name of Medication(s) amlodipine Last date of OV:  04-15-16 Pharmacy:  [facility]  [address] Has appt 11-11-16 but needs her pills until then, she ran out today  Will route refill request to Clinic RN.  Discussed with patient policy to call pharmacy for future refills.  Also, discussed refills may take up to 48 hours to approve or deny.  Roseanna Rainbow

## 2016-11-11 ENCOUNTER — Ambulatory Visit (INDEPENDENT_AMBULATORY_CARE_PROVIDER_SITE_OTHER): Payer: Medicare Other | Admitting: Obstetrics and Gynecology

## 2016-11-11 ENCOUNTER — Encounter: Payer: Self-pay | Admitting: Obstetrics and Gynecology

## 2016-11-11 VITALS — BP 178/90 | HR 70 | Temp 98.3°F | Wt 112.0 lb

## 2016-11-11 DIAGNOSIS — I10 Essential (primary) hypertension: Secondary | ICD-10-CM | POA: Diagnosis not present

## 2016-11-11 DIAGNOSIS — F1990 Other psychoactive substance use, unspecified, uncomplicated: Secondary | ICD-10-CM | POA: Diagnosis not present

## 2016-11-11 DIAGNOSIS — Z0000 Encounter for general adult medical examination without abnormal findings: Secondary | ICD-10-CM | POA: Diagnosis not present

## 2016-11-11 DIAGNOSIS — R634 Abnormal weight loss: Secondary | ICD-10-CM

## 2016-11-11 DIAGNOSIS — E785 Hyperlipidemia, unspecified: Secondary | ICD-10-CM | POA: Diagnosis not present

## 2016-11-11 MED ORDER — AMLODIPINE BESYLATE 10 MG PO TABS: 10.0000 mg | ORAL_TABLET | Freq: Every day | ORAL | 1 refills | Status: AC

## 2016-11-11 MED ORDER — CLONIDINE HCL 0.1 MG PO TABS: 0.1000 mg | ORAL_TABLET | Freq: Four times a day (QID) | ORAL | 0 refills | Status: DC | PRN

## 2016-11-11 NOTE — Patient Instructions (Signed)
Medication given for withdrawal symptoms Need to come back in for discussion on cessation  BP medication refilled. Do not take while taking withdrawal medication New cholesterol medication also sent   orders placed for colonoscopy and mammogram

## 2016-11-11 NOTE — Progress Notes (Signed)
Subjective: Chief Complaint  Patient presents with  . Medication Refill     HPI: Ashley [name] is a 66 y.o. presenting to clinic today to discuss the following:  #Hypertension Blood pressure at home: not checking Blood pressure today: elevated Taking Meds: needs refills on medications, has been out of medications for about 1 week Side effects: none ROS: Denies headache, chest pain, abdominal pain or shortness of breath.  #Substance Abuse H/o multiple substance use Recently seen in clinic trying to get off of heroin Last used heroin 3 days ago Believes she is starting to withdraw endorsing cold chills, diarrhea, nausea, loss of appetitie  #Hyperlipidemia  Not taking statin Not taking ASA States she stopped because she did not like how it made her feel; willing to try new medication  #Weight loss Has noticed a 30lb weight loss Not trying to loss weight has had loss of appetite Denies any fevers, pain, +chills, no masses Not up to date on cancer screenings   ROS noted in HPI.   Past Medical, Surgical, Social, and Family History Reviewed & Updated per EMR.   Past Medical History:  Diagnosis Date  . Acute kidney injury ([place]) 05/24/2013  . Arthritis   . Cervical radiculopathy 02/28/2011  . Cocaine substance abuse 05/26/2013   positive UDS   . Hepatitis C 1987   history of IVDA  . Hyperlipidemia   . Hypertension   . Marijuana abuse 12/18/204   positive UDS, family members smoke as well  . Pancreatitis 2000   resolved  . Ulcer 1990   gastric ulcer. Ruptured s/p emergency repair    History  Smoking Status  . Current Every Day Smoker  . Packs/day: 0.25  . Years: 40.00  . Types: Cigarettes  Smokeless Tobacco  . Never Used    Objective: BP (!) 178/90   Pulse 70   Temp 98.3 F (36.8 C) (Oral)   Wt 112 lb (50.8 kg)   SpO2 99%   BMI 19.84 kg/m  Vitals and nursing notes reviewed  Physical Exam Gen: NAD, resting comfortably, thin CV: Regular  rate Pulm: NWOB Skin: warm, dry Neuro: grossly normal, moves all extremities Psych: Normal affect and thought content  Assessment/Plan: Please see problem based Assessment and Plan PATIENT EDUCATION PROVIDED: See AVS    Diagnosis and plan along with any newly prescribed medication(s) were discussed in detail with this patient today. The patient verbalized understanding and agreed with the plan. Patient advised if symptoms worsen return to clinic or ER.    Orders Placed This Encounter  Procedures  . MM DIGITAL SCREENING BILATERAL    Standing Status:   Future    Standing Expiration Date:   01/13/2018    Order Specific Question:   Reason for Exam (SYMPTOM  OR DIAGNOSIS REQUIRED)    Answer:   breast cancer screening    Order Specific Question:   Preferred imaging location?    Answer:   [facility]  . Ambulatory referral to Gastroenterology    Referral Priority:   Routine    Referral Type:   Consultation    Referral Reason:   Specialty Services Required    Number of Visits Requested:   1    Meds ordered this encounter  Medications  . amLODipine (NORVASC) 10 MG tablet    Sig: Take 1 tablet (10 mg total) by mouth daily.    Dispense:  90 tablet    Refill:  1  . cloNIDine (CATAPRES) 0.1 MG tablet  Sig: Take 1 tablet (0.1 mg total) by mouth every 6 (six) hours as needed (withdrawl symtpoms).    Dispense:  10 tablet    Refill:  0    Luiz Blare, DO 11/11/2016, 3:21 PM PGY-3, [place]

## 2016-11-13 ENCOUNTER — Ambulatory Visit (INDEPENDENT_AMBULATORY_CARE_PROVIDER_SITE_OTHER): Payer: Medicare Other | Admitting: Obstetrics and Gynecology

## 2016-11-13 ENCOUNTER — Encounter: Payer: Self-pay | Admitting: Internal Medicine

## 2016-11-13 VITALS — BP 190/100 | HR 68 | Temp 98.1°F | Ht 63.0 in | Wt 114.6 lb

## 2016-11-13 DIAGNOSIS — I10 Essential (primary) hypertension: Secondary | ICD-10-CM

## 2016-11-13 DIAGNOSIS — Z1211 Encounter for screening for malignant neoplasm of colon: Secondary | ICD-10-CM | POA: Insufficient documentation

## 2016-11-13 DIAGNOSIS — E785 Hyperlipidemia, unspecified: Secondary | ICD-10-CM

## 2016-11-13 DIAGNOSIS — F1990 Other psychoactive substance use, unspecified, uncomplicated: Secondary | ICD-10-CM | POA: Diagnosis not present

## 2016-11-13 DIAGNOSIS — R634 Abnormal weight loss: Secondary | ICD-10-CM

## 2016-11-13 DIAGNOSIS — Z0000 Encounter for general adult medical examination without abnormal findings: Secondary | ICD-10-CM

## 2016-11-13 MED ORDER — BOOST / RESOURCE BREEZE PO LIQD: 1.0000 | Freq: Three times a day (TID) | ORAL | 6 refills | Status: DC

## 2016-11-13 MED ORDER — CLONIDINE HCL 0.1 MG PO TABS: 0.1000 mg | ORAL_TABLET | Freq: Once | ORAL | Status: DC

## 2016-11-13 MED ORDER — CLONIDINE HCL 0.1 MG PO TABS
0.2000 mg | ORAL_TABLET | Freq: Once | ORAL | Status: AC
  Administered 2016-11-13: 0.2 mg via ORAL

## 2016-11-13 MED ORDER — ROSUVASTATIN CALCIUM 20 MG PO TABS: 20.0000 mg | ORAL_TABLET | Freq: Every day | ORAL | 6 refills | Status: DC

## 2016-11-13 NOTE — Assessment & Plan Note (Signed)
Orders placed for colonoscopy and mammogram.

## 2016-11-13 NOTE — Progress Notes (Signed)
     Subjective: Chief Complaint  Patient presents with  . Weight Loss     HPI: Ashley [name] is a 66 y.o. presenting to clinic today to discuss the following:  #Weight Loss Presents with daughter to discuss weight loss Daughter concerned  patient states she has no appetite Clothes are fitting looser Not up to date on health maintenance  Endorses +diarrhia, chills/sweats Denies fevers, pain, masses, blood  #Hypertension Off blood pressure medicine Didn't have the money to fill from last visit Has not taken in about a week Elevated BP today  #Substance Use Patient last seen 2 days ago with withdrawal symptoms  Was given clonidine to help with symptoms but never picked up States she used heroin again yesterday Daughter is aware of moms substance use  ROS noted in HPI.   Past Medical, Surgical, Social, and Family History Reviewed & Updated per EMR.    History  Smoking Status  . Current Every Day Smoker  . Packs/day: 0.25  . Years: 40.00  . Types: Cigarettes  Smokeless Tobacco  . Never Used      Objective: BP (!) 190/100   Pulse 68   Temp 98.1 F (36.7 C) (Oral)   Ht 5\' 3"  (1.6 m)   Wt 114 lb 9.6 oz (52 kg)   SpO2 99%   BMI 20.30 kg/m  Vitals and nursing notes reviewed  Physical Exam Gen: NAD, resting comfortably, thin CV: Regular rate Pulm: NWOB Skin: warm, dry Neuro: grossly normal, moves all extremities Psych: Normal affect and thought content   Assessment/Plan: Please see problem based Assessment and Plan PATIENT EDUCATION PROVIDED: See AVS     Orders Placed This Encounter  Procedures  . Lipid Panel    Order Specific Question:   Has the patient fasted?    Answer:   No  . HIV antibody  . RPR  . TSH  . Comprehensive metabolic panel    Order Specific Question:   Has the patient fasted?    Answer:   No  . CBC with Differential  . Hepatitis C antibody    Meds ordered this encounter  Medications  . feeding supplement (BOOST /  RESOURCE BREEZE) LIQD    Sig: Take 1 Container by mouth 3 (three) times daily between meals.    Dispense:  237 mL    Refill:  6  . DISCONTD: cloNIDine (CATAPRES) tablet 0.1 mg  . cloNIDine (CATAPRES) tablet 0.2 mg     Luiz Blare, DO 11/13/2016, 3:06 PM PGY-3, [place]

## 2016-11-13 NOTE — Assessment & Plan Note (Signed)
Discussed cessation and health implications of continued use. Patient having symptoms of withdrawal. Rx given for clonidine to help with withdrawal symptoms. Patient does not seem ready to fully quit. Need to monitor.

## 2016-11-13 NOTE — Assessment & Plan Note (Signed)
Did not tolerate Lipitor. Not currently on statin. ASCVD score 49% in the next 10 years. Intolerant to ASA. Rx given for Crestor to try. Will need lipid panel at next visit with other blood work.

## 2016-11-13 NOTE — Assessment & Plan Note (Addendum)
Elevated today. Patient has been without BP medication. Refill given. Need to follow-up in 2 weeks for recheck to make sure she doesn't need adjustments. Ideal BP <130/80. Discussed long term implications of elevated pressures.

## 2016-11-13 NOTE — Patient Instructions (Addendum)
Supplemental drink sent to pharmacy Discount coupons given Take all your medications are prescribed Please contact our social worker about helping with substance use Need to schedule mammogram!  Failure to Thrive, Adult Failure to thrive is a group of problems. These problems include eating too little and losing weight. People who have this condition may do fewer and fewer activities over time. They may lose interest in being with friends or they may not want to eat or drink. Follow these instructions at home:  Take medicines only as told by your doctor.  Eat a healthy, well-balanced diet. Make sure that you eat enough.  Be active. Do strength training. A physical therapist can help to set up an exercise program that fits you.  Make sure that you are safe at home.  Make sure that you have a plan for what to do if you cannot make decisions for yourself. Contact a doctor if:  You are not able to eat well.  You are not able to move around.  You feel very sad.  You feel very hopeless. Get help right away if:  You think about ending your life.  You cannot eat or drink.  You do not get out of bed.  Staying at home is not safe.  You have a fever. This information is not intended to replace advice given to you by your health care provider. Make sure you discuss any questions you have with your health care provider. Document Released: 05/15/2011 Document Revised: 11/01/2015 Document Reviewed: 08/21/2014 Elsevier Interactive Patient Education  [employer].

## 2016-11-13 NOTE — Assessment & Plan Note (Signed)
Ongoing problem for patient over the last 2 years. Since last visit in November patient has lost 11lbs. Poor appetite. Chronic tobacco abuse. Polysubstance abuse. No obvious medication on list that could contribute to poor appetite. Discussed that acutely while withdrawing and using heroin patient is going to have weight loss and poor appetite. Encouraged patient to stop substance use to help with weight gain. Also discussed smoking cessation. Discussed adding Ensure or boost to meals.

## 2016-11-14 LAB — CBC WITH DIFFERENTIAL/PLATELET
BASOS: 0 %
Basophils Absolute: 0 10*3/uL (ref 0.0–0.2)
EOS (ABSOLUTE): 0.2 10*3/uL (ref 0.0–0.4)
EOS: 3 %
Hematocrit: 36.2 % (ref 34.0–46.6)
Hemoglobin: 11.6 g/dL (ref 11.1–15.9)
IMMATURE GRANS (ABS): 0 10*3/uL (ref 0.0–0.1)
IMMATURE GRANULOCYTES: 0 %
LYMPHS: 39 %
Lymphocytes Absolute: 3.1 10*3/uL (ref 0.7–3.1)
MCH: 29 pg (ref 26.6–33.0)
MCHC: 32 g/dL (ref 31.5–35.7)
MCV: 91 fL (ref 79–97)
MONOS ABS: 0.7 10*3/uL (ref 0.1–0.9)
Monocytes: 9 %
NEUTROS PCT: 49 %
Neutrophils Absolute: 3.9 10*3/uL (ref 1.4–7.0)
PLATELETS: 336 10*3/uL (ref 150–379)
RBC: 4 x10E6/uL (ref 3.77–5.28)
RDW: 15.4 % (ref 12.3–15.4)
WBC: 8 10*3/uL (ref 3.4–10.8)

## 2016-11-14 LAB — LIPID PANEL
CHOLESTEROL TOTAL: 281 mg/dL — AB (ref 100–199)
Chol/HDL Ratio: 3.9 ratio (ref 0.0–4.4)
HDL: 72 mg/dL (ref >39)
LDL Calculated: 181 mg/dL — ABNORMAL HIGH (ref 0–99)
Triglycerides: 142 mg/dL (ref 0–149)
VLDL Cholesterol Cal: 28 mg/dL (ref 5–40)

## 2016-11-14 LAB — COMPREHENSIVE METABOLIC PANEL
ALK PHOS: 58 IU/L (ref 39–117)
ALT: 10 IU/L (ref 0–32)
AST: 16 IU/L (ref 0–40)
Albumin/Globulin Ratio: 1.2 (ref 1.2–2.2)
Albumin: 3.6 g/dL (ref 3.6–4.8)
BUN/Creatinine Ratio: 9 — ABNORMAL LOW (ref 12–28)
BUN: 41 mg/dL — ABNORMAL HIGH (ref 8–27)
Bilirubin Total: <0.2 mg/dL (ref 0.0–1.2)
CALCIUM: 9 mg/dL (ref 8.7–10.3)
CO2: 22 mmol/L (ref 18–29)
CREATININE: 4.75 mg/dL — AB (ref 0.57–1.00)
Chloride: 104 mmol/L (ref 96–106)
GFR calc Af Amer: 10 mL/min/{1.73_m2} — ABNORMAL LOW (ref >59)
GFR, EST NON AFRICAN AMERICAN: 9 mL/min/{1.73_m2} — AB (ref >59)
Globulin, Total: 3.1 g/dL (ref 1.5–4.5)
Glucose: 127 mg/dL — ABNORMAL HIGH (ref 65–99)
POTASSIUM: 4.1 mmol/L (ref 3.5–5.2)
Sodium: 142 mmol/L (ref 134–144)
Total Protein: 6.7 g/dL (ref 6.0–8.5)

## 2016-11-14 LAB — RPR: RPR: NONREACTIVE

## 2016-11-14 LAB — TSH: TSH: 2.02 u[IU]/mL (ref 0.450–4.500)

## 2016-11-14 LAB — HIV ANTIBODY (ROUTINE TESTING W REFLEX): HIV SCREEN 4TH GENERATION: NONREACTIVE

## 2016-11-14 LAB — HEPATITIS C ANTIBODY: Hep C Virus Ab: >11 s/co ratio — ABNORMAL HIGH (ref 0.0–0.9)

## 2016-11-16 NOTE — Assessment & Plan Note (Signed)
Elevated. Patient noncompliant with medications. One dose of clonidine given in clinic with monitoring. Patient encouraged to go pick up medications today. Given discount coupons.

## 2016-11-16 NOTE — Assessment & Plan Note (Addendum)
Ongoing long-term problem. Patient actually has gained weight in the last 2 days since last visit. Patient denies any unprotected sex. Denies mood symptoms, SI or HI. Having some symptoms but feel this is most consistent with substance abuse and withdrawal.  -Will check CMET, TSH, HIV, RPR, and CBC with differential -Discussed need to get up to date on health screenings. Orders placed.  -Discussed adding Boost to meals; Rx given -Tobacco cessation counseling provided -Follow-up in 3-4 weeks for weight recheck.

## 2016-11-16 NOTE — Assessment & Plan Note (Signed)
Lipid panel ordered. Patient encouraged to go pick up statin from last visit and start taking.

## 2016-11-16 NOTE — Assessment & Plan Note (Signed)
Continues to use with most recent use yesterday. Called pharmacy to discontinue clonidine as patient not going to be a good candidate for medication with recurrent use. CSW information given to help with finding rehab facilities. Patient does not appear ready to quit. Will continue to counsel.

## 2016-11-17 ENCOUNTER — Other Ambulatory Visit: Payer: Self-pay | Admitting: Obstetrics and Gynecology

## 2016-11-17 DIAGNOSIS — B182 Chronic viral hepatitis C: Secondary | ICD-10-CM

## 2016-11-18 ENCOUNTER — Telehealth: Payer: Self-pay | Admitting: Internal Medicine

## 2016-11-18 NOTE — Telephone Encounter (Signed)
I am covering Dr. Gerarda Fraction' inbox while she is on vacation. Called patient over the phone to discuss her lab results from 6/18. I let her know that her kidney function has gotten significantly worse from two years ago. Her potassium is normal. Discussed with Dr. McDiarmid. Patient was already scheduled to follow-up with Dr. Gerarda Fraction on 7/3. I have moved up this appointment to 6/22 at 10:30AM. Patient will likely need addition work-up for her renal insufficiency. Patient voiced understanding.  Hyman Bible, MD PGY-2

## 2016-11-28 ENCOUNTER — Ambulatory Visit: Payer: Medicare Other | Admitting: Obstetrics and Gynecology

## 2016-12-03 ENCOUNTER — Ambulatory Visit: Payer: Medicare Other | Admitting: Obstetrics and Gynecology

## 2016-12-08 ENCOUNTER — Ambulatory Visit: Payer: Medicare Other | Admitting: Obstetrics and Gynecology

## 2016-12-09 ENCOUNTER — Ambulatory Visit: Payer: Medicare Other | Admitting: Obstetrics and Gynecology

## 2016-12-31 ENCOUNTER — Encounter: Payer: Self-pay | Admitting: *Deleted

## 2017-01-07 ENCOUNTER — Encounter: Payer: Medicare Other | Admitting: Internal Medicine

## 2017-01-19 ENCOUNTER — Ambulatory Visit: Payer: Medicare Other | Admitting: Internal Medicine

## 2017-02-13 ENCOUNTER — Ambulatory Visit: Payer: Medicare Other

## 2017-06-09 DIAGNOSIS — I639 Cerebral infarction, unspecified: Secondary | ICD-10-CM

## 2017-06-09 HISTORY — DX: Cerebral infarction, unspecified: I63.9

## 2017-06-13 ENCOUNTER — Inpatient Hospital Stay (HOSPITAL_COMMUNITY)
Admission: EM | Admit: 2017-06-13 | Discharge: 2017-07-02 | DRG: 981 | Disposition: A | Discharge status: Skilled Nursing Facility | Payer: Medicare Other | Attending: Family Medicine | Admitting: Family Medicine

## 2017-06-13 ENCOUNTER — Emergency Department (HOSPITAL_COMMUNITY): Payer: Medicare Other

## 2017-06-13 ENCOUNTER — Other Ambulatory Visit: Payer: Self-pay

## 2017-06-13 ENCOUNTER — Encounter (HOSPITAL_COMMUNITY): Payer: Self-pay

## 2017-06-13 DIAGNOSIS — N186 End stage renal disease: Secondary | ICD-10-CM | POA: Diagnosis present

## 2017-06-13 DIAGNOSIS — B9689 Other specified bacterial agents as the cause of diseases classified elsewhere: Secondary | ICD-10-CM | POA: Diagnosis present

## 2017-06-13 DIAGNOSIS — R9431 Abnormal electrocardiogram [ECG] [EKG]: Secondary | ICD-10-CM | POA: Diagnosis not present

## 2017-06-13 DIAGNOSIS — R531 Weakness: Secondary | ICD-10-CM | POA: Diagnosis not present

## 2017-06-13 DIAGNOSIS — K259 Gastric ulcer, unspecified as acute or chronic, without hemorrhage or perforation: Secondary | ICD-10-CM | POA: Diagnosis not present

## 2017-06-13 DIAGNOSIS — Z79899 Other long term (current) drug therapy: Secondary | ICD-10-CM | POA: Diagnosis not present

## 2017-06-13 DIAGNOSIS — F1410 Cocaine abuse, uncomplicated: Secondary | ICD-10-CM | POA: Diagnosis present

## 2017-06-13 DIAGNOSIS — R7989 Other specified abnormal findings of blood chemistry: Secondary | ICD-10-CM

## 2017-06-13 DIAGNOSIS — E872 Acidosis: Secondary | ICD-10-CM | POA: Diagnosis present

## 2017-06-13 DIAGNOSIS — Z6821 Body mass index (BMI) 21.0-21.9, adult: Secondary | ICD-10-CM

## 2017-06-13 DIAGNOSIS — Z90710 Acquired absence of both cervix and uterus: Secondary | ICD-10-CM

## 2017-06-13 DIAGNOSIS — Z992 Dependence on renal dialysis: Secondary | ICD-10-CM

## 2017-06-13 DIAGNOSIS — A5901 Trichomonal vulvovaginitis: Secondary | ICD-10-CM | POA: Diagnosis present

## 2017-06-13 DIAGNOSIS — Z8249 Family history of ischemic heart disease and other diseases of the circulatory system: Secondary | ICD-10-CM | POA: Diagnosis not present

## 2017-06-13 DIAGNOSIS — N185 Chronic kidney disease, stage 5: Secondary | ICD-10-CM | POA: Diagnosis not present

## 2017-06-13 DIAGNOSIS — R404 Transient alteration of awareness: Secondary | ICD-10-CM | POA: Diagnosis not present

## 2017-06-13 DIAGNOSIS — N189 Chronic kidney disease, unspecified: Secondary | ICD-10-CM

## 2017-06-13 DIAGNOSIS — I120 Hypertensive chronic kidney disease with stage 5 chronic kidney disease or end stage renal disease: Secondary | ICD-10-CM | POA: Diagnosis not present

## 2017-06-13 DIAGNOSIS — E785 Hyperlipidemia, unspecified: Secondary | ICD-10-CM | POA: Diagnosis not present

## 2017-06-13 DIAGNOSIS — G8191 Hemiplegia, unspecified affecting right dominant side: Secondary | ICD-10-CM | POA: Diagnosis not present

## 2017-06-13 DIAGNOSIS — I953 Hypotension of hemodialysis: Secondary | ICD-10-CM | POA: Diagnosis not present

## 2017-06-13 DIAGNOSIS — R29703 NIHSS score 3: Secondary | ICD-10-CM | POA: Diagnosis not present

## 2017-06-13 DIAGNOSIS — N179 Acute kidney failure, unspecified: Secondary | ICD-10-CM | POA: Diagnosis not present

## 2017-06-13 DIAGNOSIS — K5900 Constipation, unspecified: Secondary | ICD-10-CM | POA: Diagnosis not present

## 2017-06-13 DIAGNOSIS — Z7982 Long term (current) use of aspirin: Secondary | ICD-10-CM | POA: Diagnosis not present

## 2017-06-13 DIAGNOSIS — I6789 Other cerebrovascular disease: Secondary | ICD-10-CM | POA: Diagnosis not present

## 2017-06-13 DIAGNOSIS — Z419 Encounter for procedure for purposes other than remedying health state, unspecified: Secondary | ICD-10-CM

## 2017-06-13 DIAGNOSIS — I10 Essential (primary) hypertension: Secondary | ICD-10-CM | POA: Diagnosis present

## 2017-06-13 DIAGNOSIS — E440 Moderate protein-calorie malnutrition: Secondary | ICD-10-CM | POA: Diagnosis present

## 2017-06-13 DIAGNOSIS — R42 Dizziness and giddiness: Secondary | ICD-10-CM | POA: Diagnosis not present

## 2017-06-13 DIAGNOSIS — N760 Acute vaginitis: Secondary | ICD-10-CM | POA: Diagnosis present

## 2017-06-13 DIAGNOSIS — I639 Cerebral infarction, unspecified: Secondary | ICD-10-CM | POA: Diagnosis present

## 2017-06-13 DIAGNOSIS — F1910 Other psychoactive substance abuse, uncomplicated: Secondary | ICD-10-CM

## 2017-06-13 DIAGNOSIS — Z4901 Encounter for fitting and adjustment of extracorporeal dialysis catheter: Secondary | ICD-10-CM

## 2017-06-13 DIAGNOSIS — T7421XA Adult sexual abuse, confirmed, initial encounter: Secondary | ICD-10-CM | POA: Diagnosis present

## 2017-06-13 DIAGNOSIS — B1920 Unspecified viral hepatitis C without hepatic coma: Secondary | ICD-10-CM | POA: Diagnosis present

## 2017-06-13 DIAGNOSIS — F17210 Nicotine dependence, cigarettes, uncomplicated: Secondary | ICD-10-CM | POA: Diagnosis present

## 2017-06-13 HISTORY — DX: Chronic kidney disease, unspecified: N18.9

## 2017-06-13 HISTORY — DX: Weakness: R53.1

## 2017-06-13 LAB — CBC
HEMATOCRIT: 29.8 % — AB (ref 36.0–46.0)
Hemoglobin: 9.8 g/dL — ABNORMAL LOW (ref 12.0–15.0)
MCH: 28.6 pg (ref 26.0–34.0)
MCHC: 32.9 g/dL (ref 30.0–36.0)
MCV: 86.9 fL (ref 78.0–100.0)
PLATELETS: 287 10*3/uL (ref 150–400)
RBC: 3.43 MIL/uL — ABNORMAL LOW (ref 3.87–5.11)
RDW: 15.2 % (ref 11.5–15.5)
WBC: 8.6 10*3/uL (ref 4.0–10.5)

## 2017-06-13 LAB — I-STAT TROPONIN, ED: TROPONIN I, POC: 0.01 ng/mL (ref 0.00–0.08)

## 2017-06-13 LAB — I-STAT CHEM 8, ED
BUN: 81 mg/dL — ABNORMAL HIGH (ref 6–20)
CHLORIDE: 110 mmol/L (ref 101–111)
Calcium, Ion: 1 mmol/L — ABNORMAL LOW (ref 1.15–1.40)
Creatinine, Ser: 10.3 mg/dL — ABNORMAL HIGH (ref 0.44–1.00)
GLUCOSE: 89 mg/dL (ref 65–99)
HEMATOCRIT: 29 % — AB (ref 36.0–46.0)
HEMOGLOBIN: 9.9 g/dL — AB (ref 12.0–15.0)
Potassium: 4.9 mmol/L (ref 3.5–5.1)
SODIUM: 140 mmol/L (ref 135–145)
TCO2: 20 mmol/L — ABNORMAL LOW (ref 22–32)

## 2017-06-13 LAB — URINALYSIS, ROUTINE W REFLEX MICROSCOPIC
Bilirubin Urine: NEGATIVE
Glucose, UA: 50 mg/dL — AB
KETONES UR: NEGATIVE mg/dL
Nitrite: NEGATIVE
PROTEIN: 100 mg/dL — AB
Specific Gravity, Urine: 1.011 (ref 1.005–1.030)
pH: 6 (ref 5.0–8.0)

## 2017-06-13 LAB — DIFFERENTIAL
BASOS ABS: 0 10*3/uL (ref 0.0–0.1)
BASOS PCT: 0 %
EOS ABS: 0.2 10*3/uL (ref 0.0–0.7)
Eosinophils Relative: 2 %
Lymphocytes Relative: 29 %
Lymphs Abs: 2.5 10*3/uL (ref 0.7–4.0)
MONOS PCT: 9 %
Monocytes Absolute: 0.8 10*3/uL (ref 0.1–1.0)
Neutro Abs: 5.1 10*3/uL (ref 1.7–7.7)
Neutrophils Relative %: 60 %

## 2017-06-13 LAB — COMPREHENSIVE METABOLIC PANEL
ALT: 15 U/L (ref 14–54)
AST: 16 U/L (ref 15–41)
Albumin: 2.8 g/dL — ABNORMAL LOW (ref 3.5–5.0)
Alkaline Phosphatase: 77 U/L (ref 38–126)
Anion gap: 11 (ref 5–15)
BUN: 85 mg/dL — AB (ref 6–20)
CO2: 16 mmol/L — AB (ref 22–32)
Calcium: 7.6 mg/dL — ABNORMAL LOW (ref 8.9–10.3)
Chloride: 107 mmol/L (ref 101–111)
Creatinine, Ser: 9.67 mg/dL — ABNORMAL HIGH (ref 0.44–1.00)
GFR, EST AFRICAN AMERICAN: 4 mL/min — AB (ref >60)
GFR, EST NON AFRICAN AMERICAN: 4 mL/min — AB (ref >60)
Glucose, Bld: 89 mg/dL (ref 65–99)
POTASSIUM: 4.6 mmol/L (ref 3.5–5.1)
SODIUM: 134 mmol/L — AB (ref 135–145)
Total Bilirubin: 0.3 mg/dL (ref 0.3–1.2)
Total Protein: 6.7 g/dL (ref 6.5–8.1)

## 2017-06-13 LAB — PROTIME-INR
INR: 1.05
PROTHROMBIN TIME: 13.6 s (ref 11.4–15.2)

## 2017-06-13 LAB — RAPID URINE DRUG SCREEN, HOSP PERFORMED
Amphetamines: NOT DETECTED
BENZODIAZEPINES: NOT DETECTED
Barbiturates: NOT DETECTED
COCAINE: POSITIVE — AB
Opiates: POSITIVE — AB
Tetrahydrocannabinol: NOT DETECTED

## 2017-06-13 LAB — WET PREP, GENITAL
SPERM: NONE SEEN
YEAST WET PREP: NONE SEEN

## 2017-06-13 LAB — ETHANOL

## 2017-06-13 LAB — APTT: APTT: 30 s (ref 24–36)

## 2017-06-13 MED ORDER — METRONIDAZOLE 500 MG PO TABS
500.0000 mg | ORAL_TABLET | Freq: Once | ORAL | Status: AC
Start: 2017-06-14 — End: 2017-06-14
  Administered 2017-06-14: 500 mg via ORAL
  Filled 2017-06-13: qty 1

## 2017-06-13 MED ORDER — SODIUM CHLORIDE 0.9 % IV BOLUS (SEPSIS)
1000.0000 mL | Freq: Once | INTRAVENOUS | Status: AC
  Administered 2017-06-13: 1000 mL via INTRAVENOUS

## 2017-06-13 MED ORDER — SODIUM CHLORIDE 0.9 % IV SOLN
INTRAVENOUS | Status: DC
Start: 2017-06-14 — End: 2017-06-14
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
## 2017-06-13 NOTE — ED Triage Notes (Signed)
Pt arrived by [facility]; stated that patient c/o R sided wkness and dizziness upon standing for past six days; pt states that she felt like her R side gives out on her and she has been having to drag R leg. A/O x4

## 2017-06-13 NOTE — ED Notes (Signed)
Patient transported to CT 

## 2017-06-13 NOTE — ED Notes (Signed)
HW label sent to main lab,  Collected dark green tube for istat troponin.  Showing collected earlier but no results in chl.

## 2017-06-13 NOTE — ED Notes (Signed)
SANE nurse at bedside.

## 2017-06-14 ENCOUNTER — Inpatient Hospital Stay (HOSPITAL_COMMUNITY): Payer: Medicare Other

## 2017-06-14 ENCOUNTER — Other Ambulatory Visit: Payer: Self-pay

## 2017-06-14 DIAGNOSIS — B9689 Other specified bacterial agents as the cause of diseases classified elsewhere: Secondary | ICD-10-CM | POA: Diagnosis present

## 2017-06-14 DIAGNOSIS — E440 Moderate protein-calorie malnutrition: Secondary | ICD-10-CM | POA: Diagnosis not present

## 2017-06-14 DIAGNOSIS — F1910 Other psychoactive substance abuse, uncomplicated: Secondary | ICD-10-CM | POA: Diagnosis not present

## 2017-06-14 DIAGNOSIS — N19 Unspecified kidney failure: Secondary | ICD-10-CM | POA: Diagnosis not present

## 2017-06-14 DIAGNOSIS — R29703 NIHSS score 3: Secondary | ICD-10-CM | POA: Diagnosis present

## 2017-06-14 DIAGNOSIS — I639 Cerebral infarction, unspecified: Secondary | ICD-10-CM | POA: Diagnosis present

## 2017-06-14 DIAGNOSIS — R262 Difficulty in walking, not elsewhere classified: Secondary | ICD-10-CM | POA: Diagnosis not present

## 2017-06-14 DIAGNOSIS — K259 Gastric ulcer, unspecified as acute or chronic, without hemorrhage or perforation: Secondary | ICD-10-CM | POA: Diagnosis present

## 2017-06-14 DIAGNOSIS — R531 Weakness: Secondary | ICD-10-CM

## 2017-06-14 DIAGNOSIS — A5901 Trichomonal vulvovaginitis: Secondary | ICD-10-CM | POA: Diagnosis present

## 2017-06-14 DIAGNOSIS — S59919D Unspecified injury of unspecified forearm, subsequent encounter: Secondary | ICD-10-CM | POA: Diagnosis not present

## 2017-06-14 DIAGNOSIS — I6789 Other cerebrovascular disease: Secondary | ICD-10-CM

## 2017-06-14 DIAGNOSIS — M6281 Muscle weakness (generalized): Secondary | ICD-10-CM | POA: Diagnosis not present

## 2017-06-14 DIAGNOSIS — E872 Acidosis: Secondary | ICD-10-CM | POA: Diagnosis not present

## 2017-06-14 DIAGNOSIS — M5127 Other intervertebral disc displacement, lumbosacral region: Secondary | ICD-10-CM | POA: Diagnosis not present

## 2017-06-14 DIAGNOSIS — Z4901 Encounter for fitting and adjustment of extracorporeal dialysis catheter: Secondary | ICD-10-CM | POA: Diagnosis not present

## 2017-06-14 DIAGNOSIS — Z90710 Acquired absence of both cervix and uterus: Secondary | ICD-10-CM | POA: Diagnosis not present

## 2017-06-14 DIAGNOSIS — N186 End stage renal disease: Secondary | ICD-10-CM | POA: Diagnosis not present

## 2017-06-14 DIAGNOSIS — N189 Chronic kidney disease, unspecified: Secondary | ICD-10-CM | POA: Diagnosis not present

## 2017-06-14 DIAGNOSIS — I10 Essential (primary) hypertension: Secondary | ICD-10-CM | POA: Diagnosis not present

## 2017-06-14 DIAGNOSIS — F17210 Nicotine dependence, cigarettes, uncomplicated: Secondary | ICD-10-CM | POA: Diagnosis present

## 2017-06-14 DIAGNOSIS — Z6821 Body mass index (BMI) 21.0-21.9, adult: Secondary | ICD-10-CM | POA: Diagnosis not present

## 2017-06-14 DIAGNOSIS — F1410 Cocaine abuse, uncomplicated: Secondary | ICD-10-CM

## 2017-06-14 DIAGNOSIS — R42 Dizziness and giddiness: Secondary | ICD-10-CM | POA: Diagnosis not present

## 2017-06-14 DIAGNOSIS — Z7982 Long term (current) use of aspirin: Secondary | ICD-10-CM | POA: Diagnosis not present

## 2017-06-14 DIAGNOSIS — N760 Acute vaginitis: Secondary | ICD-10-CM | POA: Diagnosis present

## 2017-06-14 DIAGNOSIS — G8191 Hemiplegia, unspecified affecting right dominant side: Secondary | ICD-10-CM | POA: Diagnosis present

## 2017-06-14 DIAGNOSIS — E785 Hyperlipidemia, unspecified: Secondary | ICD-10-CM | POA: Diagnosis not present

## 2017-06-14 DIAGNOSIS — T7421XD Adult sexual abuse, confirmed, subsequent encounter: Secondary | ICD-10-CM | POA: Diagnosis not present

## 2017-06-14 DIAGNOSIS — G458 Other transient cerebral ischemic attacks and related syndromes: Secondary | ICD-10-CM | POA: Diagnosis not present

## 2017-06-14 DIAGNOSIS — K5900 Constipation, unspecified: Secondary | ICD-10-CM | POA: Diagnosis not present

## 2017-06-14 DIAGNOSIS — I953 Hypotension of hemodialysis: Secondary | ICD-10-CM | POA: Diagnosis not present

## 2017-06-14 DIAGNOSIS — Z79899 Other long term (current) drug therapy: Secondary | ICD-10-CM | POA: Diagnosis not present

## 2017-06-14 DIAGNOSIS — T7421XA Adult sexual abuse, confirmed, initial encounter: Secondary | ICD-10-CM | POA: Diagnosis present

## 2017-06-14 DIAGNOSIS — B1920 Unspecified viral hepatitis C without hepatic coma: Secondary | ICD-10-CM | POA: Diagnosis present

## 2017-06-14 DIAGNOSIS — N179 Acute kidney failure, unspecified: Secondary | ICD-10-CM | POA: Diagnosis not present

## 2017-06-14 DIAGNOSIS — G464 Cerebellar stroke syndrome: Secondary | ICD-10-CM | POA: Diagnosis not present

## 2017-06-14 DIAGNOSIS — Z8249 Family history of ischemic heart disease and other diseases of the circulatory system: Secondary | ICD-10-CM | POA: Diagnosis not present

## 2017-06-14 DIAGNOSIS — I120 Hypertensive chronic kidney disease with stage 5 chronic kidney disease or end stage renal disease: Secondary | ICD-10-CM | POA: Diagnosis not present

## 2017-06-14 DIAGNOSIS — R278 Other lack of coordination: Secondary | ICD-10-CM | POA: Diagnosis not present

## 2017-06-14 DIAGNOSIS — N185 Chronic kidney disease, stage 5: Secondary | ICD-10-CM | POA: Diagnosis not present

## 2017-06-14 DIAGNOSIS — Z992 Dependence on renal dialysis: Secondary | ICD-10-CM | POA: Diagnosis not present

## 2017-06-14 DIAGNOSIS — R7989 Other specified abnormal findings of blood chemistry: Secondary | ICD-10-CM | POA: Diagnosis not present

## 2017-06-14 HISTORY — DX: Weakness: R53.1

## 2017-06-14 LAB — RENAL FUNCTION PANEL
Albumin: 2.8 g/dL — ABNORMAL LOW (ref 3.5–5.0)
Anion gap: 12 (ref 5–15)
BUN: 76 mg/dL — AB (ref 6–20)
CHLORIDE: 107 mmol/L (ref 101–111)
CO2: 14 mmol/L — ABNORMAL LOW (ref 22–32)
Calcium: 7.6 mg/dL — ABNORMAL LOW (ref 8.9–10.3)
Creatinine, Ser: 9.2 mg/dL — ABNORMAL HIGH (ref 0.44–1.00)
GFR calc Af Amer: 4 mL/min — ABNORMAL LOW (ref >60)
GFR, EST NON AFRICAN AMERICAN: 4 mL/min — AB (ref >60)
Glucose, Bld: 90 mg/dL (ref 65–99)
POTASSIUM: 3.9 mmol/L (ref 3.5–5.1)
Phosphorus: 5.3 mg/dL — ABNORMAL HIGH (ref 2.5–4.6)
Sodium: 133 mmol/L — ABNORMAL LOW (ref 135–145)

## 2017-06-14 LAB — ECHOCARDIOGRAM COMPLETE
Height: 62 in
WEIGHTICAEL: 1756.63 [oz_av]

## 2017-06-14 LAB — CBC
HEMATOCRIT: 30.6 % — AB (ref 36.0–46.0)
Hemoglobin: 10.2 g/dL — ABNORMAL LOW (ref 12.0–15.0)
MCH: 29 pg (ref 26.0–34.0)
MCHC: 33.3 g/dL (ref 30.0–36.0)
MCV: 86.9 fL (ref 78.0–100.0)
Platelets: 287 10*3/uL (ref 150–400)
RBC: 3.52 MIL/uL — ABNORMAL LOW (ref 3.87–5.11)
RDW: 15.3 % (ref 11.5–15.5)
WBC: 8.4 10*3/uL (ref 4.0–10.5)

## 2017-06-14 LAB — RPR: RPR: NONREACTIVE

## 2017-06-14 LAB — LIPID PANEL
CHOL/HDL RATIO: 3.8 ratio
Cholesterol: 219 mg/dL — ABNORMAL HIGH (ref 0–200)
HDL: 57 mg/dL (ref >40)
LDL Cholesterol: 137 mg/dL — ABNORMAL HIGH (ref 0–99)
Triglycerides: 125 mg/dL (ref <150)
VLDL: 25 mg/dL (ref 0–40)

## 2017-06-14 LAB — HEMOGLOBIN A1C
Hgb A1c MFr Bld: 5.1 % (ref 4.8–5.6)
MEAN PLASMA GLUCOSE: 99.67 mg/dL

## 2017-06-14 LAB — PROTEIN / CREATININE RATIO, URINE
CREATININE, URINE: 107.1 mg/dL
Protein Creatinine Ratio: 4.07 mg/mg{Cre} — ABNORMAL HIGH (ref 0.00–0.15)
Total Protein, Urine: 436 mg/dL

## 2017-06-14 LAB — HIV ANTIBODY (ROUTINE TESTING W REFLEX): HIV SCREEN 4TH GENERATION: NONREACTIVE

## 2017-06-14 LAB — SODIUM, URINE, RANDOM: Sodium, Ur: 28 mmol/L

## 2017-06-14 LAB — CREATININE, URINE, RANDOM: CREATININE, URINE: 107.75 mg/dL

## 2017-06-14 MED ORDER — METRONIDAZOLE 500 MG PO TABS
500.0000 mg | ORAL_TABLET | Freq: Two times a day (BID) | ORAL | Status: DC
  Filled 2017-06-14: qty 1

## 2017-06-14 MED ORDER — SENNOSIDES-DOCUSATE SODIUM 8.6-50 MG PO TABS: 1.0000 | ORAL_TABLET | Freq: Every evening | ORAL | Status: DC | PRN

## 2017-06-14 MED ORDER — ACETAMINOPHEN 325 MG PO TABS
650.0000 mg | ORAL_TABLET | Freq: Four times a day (QID) | ORAL | Status: DC | PRN
  Administered 2017-06-14 – 2017-07-01 (×14): 650 mg via ORAL
  Filled 2017-06-14 (×15): qty 2

## 2017-06-14 MED ORDER — CLONIDINE HCL 0.1 MG PO TABS
0.1000 mg | ORAL_TABLET | Freq: Two times a day (BID) | ORAL | Status: DC
  Administered 2017-06-14 – 2017-06-16 (×5): 0.1 mg via ORAL
  Filled 2017-06-14 (×5): qty 1

## 2017-06-14 MED ORDER — ENOXAPARIN SODIUM 30 MG/0.3ML ~~LOC~~ SOLN
30.0000 mg | Freq: Every day | SUBCUTANEOUS | Status: DC
  Administered 2017-06-14 – 2017-07-01 (×17): 30 mg via SUBCUTANEOUS
  Filled 2017-06-14 (×19): qty 0.3

## 2017-06-14 MED ORDER — HYDRALAZINE HCL 20 MG/ML IJ SOLN
5.0000 mg | Freq: Four times a day (QID) | INTRAMUSCULAR | Status: DC | PRN
  Administered 2017-06-14: 5 mg via INTRAVENOUS
  Filled 2017-06-14: qty 1

## 2017-06-14 MED ORDER — STROKE: EARLY STAGES OF RECOVERY BOOK
Freq: Once | Status: DC
  Filled 2017-06-14: qty 1

## 2017-06-14 MED ORDER — ROSUVASTATIN CALCIUM 20 MG PO TABS: 40.0000 mg | ORAL_TABLET | Freq: Every day | ORAL | Status: DC

## 2017-06-14 MED ORDER — ROSUVASTATIN CALCIUM 10 MG PO TABS: 5.0000 mg | ORAL_TABLET | Freq: Every day | ORAL | Status: DC

## 2017-06-14 MED ORDER — ROSUVASTATIN CALCIUM 20 MG PO TABS: 20.0000 mg | ORAL_TABLET | Freq: Every day | ORAL | Status: DC

## 2017-06-14 MED ORDER — CALCIUM CARBONATE ANTACID 500 MG PO CHEW
200.0000 mg | CHEWABLE_TABLET | Freq: Three times a day (TID) | ORAL | Status: DC
  Administered 2017-06-14 – 2017-06-29 (×41): 200 mg via ORAL
  Filled 2017-06-14 (×43): qty 1

## 2017-06-14 MED ORDER — LACTATED RINGERS IV SOLN
INTRAVENOUS | Status: AC
  Administered 2017-06-14 – 2017-06-15 (×2): via INTRAVENOUS

## 2017-06-14 MED ORDER — PANTOPRAZOLE SODIUM 40 MG PO TBEC
40.0000 mg | DELAYED_RELEASE_TABLET | Freq: Every day | ORAL | Status: DC
  Administered 2017-06-14 – 2017-07-02 (×19): 40 mg via ORAL
  Filled 2017-06-14 (×20): qty 1

## 2017-06-14 MED ORDER — HYDRALAZINE HCL 20 MG/ML IJ SOLN
5.0000 mg | Freq: Once | INTRAMUSCULAR | Status: AC
  Administered 2017-06-14: 5 mg via INTRAVENOUS
  Filled 2017-06-14: qty 1

## 2017-06-14 MED ORDER — METRONIDAZOLE 500 MG PO TABS: 1500.0000 mg | ORAL_TABLET | Freq: Once | ORAL | Status: DC

## 2017-06-14 MED ORDER — ROSUVASTATIN CALCIUM 10 MG PO TABS
10.0000 mg | ORAL_TABLET | Freq: Every day | ORAL | Status: DC
  Administered 2017-06-14: 10 mg via ORAL
  Filled 2017-06-14: qty 1

## 2017-06-14 MED ORDER — AMLODIPINE BESYLATE 5 MG PO TABS: 10.0000 mg | ORAL_TABLET | Freq: Every day | ORAL | Status: DC

## 2017-06-14 MED ORDER — ASPIRIN EC 81 MG PO TBEC
81.0000 mg | DELAYED_RELEASE_TABLET | Freq: Every day | ORAL | Status: DC
  Administered 2017-06-14 – 2017-07-02 (×18): 81 mg via ORAL
  Filled 2017-06-14 (×18): qty 1

## 2017-06-14 MED ORDER — METRONIDAZOLE 500 MG PO TABS
500.0000 mg | ORAL_TABLET | Freq: Two times a day (BID) | ORAL | Status: AC
  Administered 2017-06-14 – 2017-06-18 (×9): 500 mg via ORAL
  Filled 2017-06-14 (×9): qty 1
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
## 2017-06-14 NOTE — ED Notes (Signed)
Patient denies pain and is resting comfortably.  

## 2017-06-14 NOTE — ED Notes (Signed)
Monitor intact to pt - sinus rhythm. Pt alert, oriented. Denies any c/o pain/weakness. Pt ate breakfast w/o difficulty.

## 2017-06-14 NOTE — Progress Notes (Signed)
VASCULAR LAB PRELIMINARY  PRELIMINARY  PRELIMINARY  PRELIMINARY  Carotid duplex completed.    Preliminary report:  1-39% ICA plaquing.  Vertebral artery flow is antegrade.  Tortuous left ICA/ECA.  Malyna Budney, RVT 06/14/2017, 4:21 PM

## 2017-06-14 NOTE — Progress Notes (Signed)
*  PRELIMINARY RESULTS* Echocardiogram 2D Echocardiogram has been performed.  Ashley [name] 06/14/2017, 4:09 PM

## 2017-06-14 NOTE — Consult Note (Addendum)
NEUROHOSPITALISTS - CONSULTATION NOTE   Requesting Physician: Dr. Kerrin Mo  Admit date: 06/13/2017    Chief Complaint: Right sided weakness  History obtained from:  Patient and Chart     HPI   Ashley [name] is an 67 y.o. female with a PMH of HTN, HLD and Polysubstance Abuse who presents to the hospital with complaints of acute onset right sided weakness over one week ago. 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  Patient is not a good historian. She states that approximately one week ago she had an acute onset of Right sided weakness with H/A and blurred vision. Her symptoms slowly improved over the week and she did not seek medical attention until yesterday when her Right sided weakness started to worsen.  She states she has not been taking her Crestor for a few months. Has not filled the Rx likely due to costs. She says she has not taken ASA in over 30 years for history of ulcer and stomach pains.   Date last known well: Date: 06/07/2017 Time last known well: patient unsure of timing tPA Given: No: stroke symptoms started over one week ago Modified Rankin: Rankin Score=0 NIHSS: 3  Past Medical History: She  has a past medical history of Acute kidney injury ([place]) (05/24/2013), Arthritis, Cervical radiculopathy (02/28/2011), Cocaine substance abuse ([place]) (05/26/2013), Gastropathy, Hepatitis C (1987), Hiatal hernia, Hyperlipidemia, Hypertension, Marijuana abuse (12/18/204), Pancreatitis (2000), Schatzki's ring, and Ulcer (1990).  Past Surgical History: She  has a past surgical history that includes Abdominal hysterectomy (1979); Repair of perforated ulcer; and Esophagogastroduodenoscopy (N/A, 05/29/2013).  Family History:  indicated that her mother is deceased. She indicated that her father is deceased. She indicated that both of her sisters are alive. She indicated that her brother is alive. She indicated that only one of her two  daughters is alive.  Social History:  She  reports that she has been smoking cigarettes.  She has a 10.00 pack-year smoking history. she has never used smokeless tobacco. She reports that she uses drugs. Drugs: Heroin and Marijuana. She reports that she does not drink alcohol.  Allergies:  Allergies  Allergen Reactions  . Aspirin Other (See Comments)    Stomach ache  . Ibuprofen Other (See Comments)    Stomach ache   Medications: Current Meds  Medication Sig  . amLODipine (NORVASC) 10 MG tablet Take 1 tablet (10 mg total) by mouth daily.  . rosuvastatin (CRESTOR) 20 MG tablet Take 1 tablet (20 mg total) by mouth daily.  Patient was not taking Crestor for the past few months. Did not fill Rx  ROS: History obtained from the patient General ROS: Posititve for - Mild H/A x one week Psychological ROS: negative for - behavioral disorder, hallucinations, memory difficulties, mood swings or suicidal ideation Ophthalmic ROS: negative for - blurry vision, double vision, eye pain or loss of vision ENT ROS: negative for - epistaxis, nasal discharge, oral lesions, sore throat, tinnitus or vertigo Allergy and Immunology ROS: negative for - hives or itchy/watery eyes Hematological and Lymphatic ROS: negative for - bleeding problems, bruising or swollen lymph nodes Endocrine ROS: negative for - galactorrhea, hair pattern changes, polydipsia/polyuria or temperature intolerance Respiratory ROS: negative for - cough, hemoptysis, shortness of breath or wheezing Cardiovascular ROS: negative for - chest pain, dyspnea on exertion, edema or irregular heartbeat Gastrointestinal ROS: negative for - abdominal pain, diarrhea, hematemesis, nausea/vomiting or stool incontinence Genito-Urinary ROS: positive for -vaginal discharge Musculoskeletal ROS: negative for - joint  swelling or muscular weakness Neurological ROS: as noted in HPI Dermatological ROS: negative for rash and skin lesion changes  Physical  Examination: Vitals:   06/14/17 0800 06/14/17 0900 06/14/17 0930 06/14/17 1016  BP: (!) 167/95 (!) 169/90 (!) 165/92 (!) 173/85  Pulse: 78 79 81 84  Resp: 14 13 14 16   Temp:    98.7 F (37.1 C)  TempSrc:    Oral  SpO2: 98% 99% 99% 98%  Weight:    49.8 kg (109 lb 12.6 oz)  Height:    5\' 2"  (1.575 m)   HEENT-  Normocephalic, Normal external eye/conjunctiva.  Normal external ears. Normal external nose, mucus membranes and septum.   Cardiovascular - Regular rate and rhythm  Respiratory - Lungs clear bilaterally. Non-labored breathing, No wheezing. Abdomen - soft and non-tender, BS normal Extremities- no edema or cyanosis Skin-warm and dry  Neurological Examination Mental Status: Alert, oriented, thought content appropriate.  Speech fluent without evidence of aphasia or neglect.  Able to follow 3 step commands without difficulty. Cranial Nerves: II: Visual Fields are full. Pupils are equal, round, and reactive to light.   III,IV, VI: EOMI without ptosis or diploplia. Rotational and horizontal nystagmus.   V: Facial sensation is symmetric to temperature VII: Facial movement is symmetric.  VIII: hearing is intact to voice X: Uvula elevates symmetrically XI: Shoulder shrug is symmetric. XII: tongue is midline without atrophy or fasciculations.  Motor: Tone is normal. Bulk is normal. 5/5 strength on left. 4/5 on the right. Right Leg slightly weaker than right arm but likely secondary to pain with movement.  Sensor: Sensation is symmetric to light touch and temperature in the arms and legs. Deep Tendon Reflexes: 2+ and symmetric throughout in the biceps and patellae Plantars: Toes are downgoing bilaterally.  Cerebellar: slightly ataxic with finger to nose on Right Gait: no truncal ataxia   Lab Results: CBC: Recent Labs  Lab 06/13/17 2052 06/13/17 2122 06/14/17 0334  WBC 8.6  --  8.4  HGB 9.8* 9.9* 10.2*  HCT 29.8* 29.0* 30.6*  MCV 86.9  --  86.9  PLT 287  --  016   Basic  Metabolic Panel: Recent Labs  Lab 06/13/17 2052 06/13/17 2122 06/14/17 0334  NA 134* 140 133*  K 4.6 4.9 3.9  CL 107 110 107  CO2 16*  --  14*  GLUCOSE 89 89 90  BUN 85* 81* 76*  CREATININE 9.67* 10.30* 9.20*  CALCIUM 7.6*  --  7.6*  PHOS  --   --  5.3*   Liver Function Tests: Recent Labs  Lab 06/13/17 2052 06/14/17 0334  AST 16  --   ALT 15  --   ALKPHOS 77  --   BILITOT 0.3  --   PROT 6.7  --   ALBUMIN 2.8* 2.8*   Urinalysis:  Recent Labs  Lab 06/13/17 2125  COLORURINE YELLOW  APPEARANCEUR CLOUDY*  LABSPEC 1.011  PHURINE 6.0  GLUCOSEU 50*  HGBUR SMALL*  BILIRUBINUR NEGATIVE  KETONESUR NEGATIVE  PROTEINUR 100*  NITRITE NEGATIVE  LEUKOCYTESUR LARGE*   Urine Drug Screen:      Component Value Date/Time   LABOPIA POSITIVE (A) 06/13/2017 2125   COCAINSCRNUR POSITIVE (A) 06/13/2017 2125   COCAINSCRNUR NEG 11/28/2013 1704   LABBENZ NONE DETECTED 06/13/2017 2125   LABBENZ NEG 11/28/2013 1704   AMPHETMU NONE DETECTED 06/13/2017 2125   THCU NONE DETECTED 06/13/2017 2125   LABBARB NONE DETECTED 06/13/2017 2125    Alcohol Level:  Recent Labs  Lab 06/13/17 2052  ETH <10   Imaging: Ct Head Wo Contrast Result Date: 06/13/2017 IMPRESSION: 1. No acute intracranial abnormality. 2. Atrophy with chronic small vessel white matter ischemic disease.   Mr Brain Wo Contrast Result Date: 06/14/2017 IMPRESSION: 1. Left posterior limb of internal capsule subcentimeter subacute infarction. No hemorrhage or mass effect. 2. Moderate chronic microvascular ischemic changes and moderate parenchymal volume loss of the brain. 3. Few punctate foci of chronic microhemorrhage in periventricular white matter, probably related to hypertension.   Mr Lumbar Spine Wo Contrast Result Date: 06/14/2017 IMPRESSION: 1. No acute osseous abnormality or malalignment. 2. Lumbar spondylosis with prominent L5-S1 discogenic degenerative changes and lower lumbar facet arthrosis. 3. L4-5 mild bilateral  foraminal and L5-S1 moderate right mild left foraminal stenosis. 4. No significant canal stenosis.     IMPRESSION: [name] is a 67 y.o. female with PMH of HTN, HLD, Polysubstance abuse who presents to the hospital with complaints of acute onset right sided weakness over one week ago. MRI reveals:  Subacute, Left posterior limb of internal capsule Infarction:  Suspected Etiology: small vessel disease Resultant Symptoms: right sided weakness Stroke Risk Factors: hyperlipidemia, hypertension and smoking Other Stroke Risk Factors: Advanced age, Cigarette smoker, Polysubstance Abuse  Outstanding Stroke Work-up Studies:     Echocardiogram:                                                     B/L Carotid U/S:   Stroke labs                                                     PLAN  06/14/2017: Frequent Neurochecks  Telemetry Monitoring NPO until passes Stroke swallow screen START Low dose EC Aspirin with Protonix and mealtime, has hx of ulcer 30 years ago Continue Statin at higher dose, as patient had not been taking x months If patient complains of stomach pain then discontinue ASA therapy HgbA1C and Lipid Profile PT/OT/SLP Consult Case Management /MSW- assistance with medications if necessary Ongoing aggressive stroke risk factor management Patient will be counseled to be compliant with her antithrombotic medications Patient will be counseled on Lifestyle modifications including, Diet, Exercise, and Stress Follow up with [facility] in 6 weeks  HYPERTENSION: Stable, some elevated B/P noted since admission. SBP goal of < 160. DBP goal of < 95 with subacute stroke Long term BP goal normotensive. May slowly restart home B/P medications  Home Meds: Norvasc  HYPERLIPIDEMIA:  Lipid panel pending Home Meds: Crestor 20 mg -patient has not been taking for a few months LDL  goal < 70 -     11/13/2016 LDL was 181 Increased  Crestor to 40 mg daily Continue statin at  discharge  R/O DIABETES: Lab Results  Component Value Date   HGBA1C 5.8 09/19/2014  HgbA1c goal < 7.0  TOBACCO ABUSE & POLYSUBSTANCE ABUSE UDS+ Current smoker Smoking cessation counseling provided Nicotine patch provided  Other Active Problems: Active Problems:   Progressive focal motor weakness    Hospital day # 0 VTE prophylaxis: SCD's  Diet - Diet renal with fluid restriction Fluid restriction: 1200 mL Fluid; Room service appropriate? Yes; Fluid consistency:  Thin   FAMILY UPDATES: No family at bedside  TEAM UPDATES:Hensel, Jamal Collin, MD   Prior Home Stroke Medications:  No antithrombotic  Discharge Stroke Meds:  Please discharge patient on aspirin 81 mg daily   Disposition: 01-Home or Self Care Therapy Recs:               PENDING Home Equipment:         PENDING Follow Up:  Rory Percy, DO -PCP Follow up in 1-2 weeks      Assessment and plan discussed with with attending physician and they are in agreement.    Mary Sella, ANP-C [facility] 06/14/2017, 10:46 AM  Please page stroke NP  Or  PA  Or MD from 8am -4 pm  as this patient from this time will be  followed by the stroke.   You can look them up on [email or web address]  Password [email or web address]    NEUROHOSPITALIST ADDENDUM Seen and examined the patient today. Formulated plan as documented above by Shaune Spittle I agree with recommendations as above.   Lacunar stroke in smoker, hypertensive pt. Needs aggressive control of risk factors. Stroke team to follow.  Karena Addison Ondrea Dow MD [facility] [phone]  If 7pm to 7am, please call on call as listed on AMION.

## 2017-06-14 NOTE — ED Notes (Signed)
Pt lying on bed w/eyes closed. Respirations even, unlabored. Pt aware of bed assignment.
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
## 2017-06-14 NOTE — Evaluation (Signed)
Speech Language Pathology Evaluation Patient Details Name: Ashley [name] MRN: [id number] DOB: [birth year] Today's Date: 06/14/2017 Time: 8453-6468 SLP Time Calculation (min) (ACUTE ONLY): 13 min  Problem List:  Patient Active Problem List   Diagnosis Date Noted  . 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 Healthcare maintenance 11/13/2016  . Substance use disorder 11/13/2016  . Loss of weight 09/19/2014  . Poor dentition 11/06/2013  . Dyspnea on exertion 11/04/2013  . Moderate malnutrition ([place]) 06/02/2013  . Acute kidney injury ([place]) 05/24/2013  . Abdominal pain 05/24/2013  . Fall 08/01/2011  . Cervical radiculopathy 02/28/2011  . Back pain 01/10/2011  . Hepatitis C   . Hyperlipidemia   . Hypertension   . TOBACCO ABUSE 12/24/2009  . GASTRIC ULCER 11/13/2008   Past Medical History:  Past Medical History:  Diagnosis Date  . Acute kidney injury ([address]) 05/24/2013  . Arthritis   . Cervical radiculopathy 02/28/2011  . Cocaine substance abuse ([place]) 05/26/2013   positive UDS   . Gastropathy   . Hepatitis C 1987   history of IVDA  . Hiatal hernia   . Hyperlipidemia   . Hypertension   . Marijuana abuse 12/18/204   positive UDS, family members smoke as well  . Pancreatitis 2000   resolved  . Schatzki's ring   . Ulcer 1990   gastric ulcer. Ruptured s/p emergency repair   Past Surgical History:  Past Surgical History:  Procedure Laterality Date  . ABDOMINAL HYSTERECTOMY  1979  . ESOPHAGOGASTRODUODENOSCOPY N/A 05/29/2013   Procedure: ESOPHAGOGASTRODUODENOSCOPY (EGD);  Surgeon: Jerene Bears, MD;  Location: [facility] ENDOSCOPY;  Service: Endoscopy;  Laterality: N/A;  . REPAIR OF PERFORATED ULCER     HPI:  67 y.o. female with a PMH of HTN, HLD and Polysubstance Abuse who presents to the hospital with complaints of acute onset right sided weakness over one week ago. 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   Assessment / Plan /  Recommendation Clinical Impression  Pt presents with normal speech, expressive/receptive language.  She demonstrated reduced effort during assessment of memory, problem-solving, giving up early into task, saying "I don't know" but also c/o pain and asking for medication (RN notified). Pt lives alone; she is worried she won't be able to move back into her home given stroke dx.  SLP will follow briefly for further evaluation as vigilance, pain management improves.     SLP Assessment  SLP Recommendation/Assessment: Patient needs continued Speech Lanaguage Pathology Services SLP Visit Diagnosis: Cognitive communication deficit (R41.841)    Follow Up Recommendations  Other (comment)(tba)    Frequency and Duration min 2x/week  1 week      SLP Evaluation Cognition  Overall Cognitive Status: Difficult to assess Arousal/Alertness: Awake/alert Orientation Level: Oriented to person;Oriented to place;Oriented to time Attention: Selective Selective Attention: Appears intact Memory: Impaired Memory Impairment: Storage deficit Problem Solving: Impaired Problem Solving Impairment: Verbal basic Executive Function: Reasoning Reasoning: Impaired Reasoning Impairment: Verbal basic Safety/Judgment: Appears intact       Comprehension  Auditory Comprehension Overall Auditory Comprehension: Appears within functional limits for tasks assessed Visual Recognition/Discrimination Discrimination: Within Function Limits Reading Comprehension Reading Status: Within funtional limits    Expression Expression Primary Mode of Expression: Verbal Verbal Expression Overall Verbal Expression: Appears within functional limits for tasks assessed Written Expression Dominant Hand: Right Written Expression: Not tested   Oral / Motor  Oral Motor/Sensory Function Overall Oral Motor/Sensory Function: Within functional limits Motor Speech Overall Motor Speech: Appears within functional limits  for tasks assessed    GO                    Juan Quam Laurice 06/14/2017, 12:50 PM  Estill Bamberg L. Tivis Ringer, [place] CCC/SLP Pager [phone]

## 2017-06-14 NOTE — Progress Notes (Signed)
Patient c/o new sharp right sided flank pain, attending paged, verbal orders for tylenol 650 q 6, will follow orders and continue to monitor

## 2017-06-14 NOTE — Progress Notes (Signed)
FPTS Interim Progress Note  S: Patient sleeping and easily woken on my arrival.  She denies pain or any other concerns.  She was surprised to find that she has a subacute stroke.  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 - will start clonidine 0.1 mg BID - d/c hydralazine - d/c home amlodipine - per neuro recs, permissive hypertension not necessary since stroke is subacute.  Goal today is < 980 systolic, goal tomorrow is <221 systolic  CKD V:  - nephrology consulted, appreciate their recommendations - renally dose medications - add phosphate binder  Trichomonas and bacterial vaginosis - metronidazole 500 mg BID x 5 days  Kathrene Alu, MD 06/14/2017, 9:12 AM PGY-1, [place] [facility] Service pager [phone]

## 2017-06-14 NOTE — ED Notes (Signed)
Pt is requesting something to eat. Pt is given a [place] sandwich, apple sauce with ginger ale to drinkm

## 2017-06-15 ENCOUNTER — Inpatient Hospital Stay (HOSPITAL_COMMUNITY): Payer: Medicare Other

## 2017-06-15 ENCOUNTER — Encounter (HOSPITAL_COMMUNITY): Payer: Self-pay | Admitting: *Deleted

## 2017-06-15 DIAGNOSIS — N189 Chronic kidney disease, unspecified: Secondary | ICD-10-CM

## 2017-06-15 DIAGNOSIS — T7421XD Adult sexual abuse, confirmed, subsequent encounter: Secondary | ICD-10-CM

## 2017-06-15 DIAGNOSIS — N185 Chronic kidney disease, stage 5: Secondary | ICD-10-CM

## 2017-06-15 LAB — KAPPA/LAMBDA LIGHT CHAINS
KAPPA, LAMDA LIGHT CHAIN RATIO: 1.31 (ref 0.26–1.65)
Kappa free light chain: 121.4 mg/L — ABNORMAL HIGH (ref 3.3–19.4)
Lambda free light chains: 92.4 mg/L — ABNORMAL HIGH (ref 5.7–26.3)

## 2017-06-15 LAB — RENAL FUNCTION PANEL
ALBUMIN: 2.5 g/dL — AB (ref 3.5–5.0)
Anion gap: 9 (ref 5–15)
BUN: 66 mg/dL — AB (ref 6–20)
CALCIUM: 7.3 mg/dL — AB (ref 8.9–10.3)
CO2: 17 mmol/L — ABNORMAL LOW (ref 22–32)
Chloride: 111 mmol/L (ref 101–111)
Creatinine, Ser: 8.81 mg/dL — ABNORMAL HIGH (ref 0.44–1.00)
GFR calc Af Amer: 5 mL/min — ABNORMAL LOW (ref >60)
GFR calc non Af Amer: 4 mL/min — ABNORMAL LOW (ref >60)
GLUCOSE: 119 mg/dL — AB (ref 65–99)
PHOSPHORUS: 5.9 mg/dL — AB (ref 2.5–4.6)
Potassium: 4.2 mmol/L (ref 3.5–5.1)
SODIUM: 137 mmol/L (ref 135–145)

## 2017-06-15 LAB — GC/CHLAMYDIA PROBE AMP (~~LOC~~) NOT AT ARMC
Chlamydia: NEGATIVE
Neisseria Gonorrhea: NEGATIVE

## 2017-06-15 LAB — C4 COMPLEMENT: Complement C4, Body Fluid: 19 mg/dL (ref 14–44)

## 2017-06-15 LAB — C3 COMPLEMENT: C3 COMPLEMENT: 105 mg/dL (ref 82–167)

## 2017-06-15 MED ORDER — AMLODIPINE BESYLATE 10 MG PO TABS
10.0000 mg | ORAL_TABLET | Freq: Every day | ORAL | Status: DC
  Administered 2017-06-15 – 2017-07-02 (×17): 10 mg via ORAL
  Filled 2017-06-15 (×18): qty 1

## 2017-06-15 MED ORDER — ATORVASTATIN CALCIUM 40 MG PO TABS
40.0000 mg | ORAL_TABLET | Freq: Every day | ORAL | Status: DC
  Administered 2017-06-15 – 2017-07-02 (×18): 40 mg via ORAL
  Filled 2017-06-15 (×18): qty 1

## 2017-06-15 MED ORDER — ROSUVASTATIN CALCIUM 20 MG PO TABS: 40.0000 mg | ORAL_TABLET | Freq: Every day | ORAL | Status: DC

## 2017-06-15 MED ORDER — NICOTINE 14 MG/24HR TD PT24
14.0000 mg | MEDICATED_PATCH | Freq: Every day | TRANSDERMAL | Status: DC
  Administered 2017-06-15 – 2017-06-16 (×2): 14 mg via TRANSDERMAL
  Filled 2017-06-15 (×2): qty 1

## 2017-06-15 MED ORDER — NEPRO/CARBSTEADY PO LIQD
237.0000 mL | Freq: Two times a day (BID) | ORAL | Status: DC
  Administered 2017-06-15 – 2017-07-02 (×20): 237 mL via ORAL
  Filled 2017-06-15 (×37): qty 237

## 2017-06-15 MED ORDER — DEXTROSE 5 % IV SOLN
1.5000 g | INTRAVENOUS | Status: AC
  Administered 2017-06-16: 1.5 g via INTRAVENOUS
  Filled 2017-06-15 (×2): qty 1.5
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
## 2017-06-15 NOTE — Progress Notes (Signed)
Introduced myself to patient as her PCP and briefly discussed her current admission. Will plan to follow along and see her in the clinic for follow up and further primary care. Appreciate the excellent care of Dr. Shan Levans and the [facility] Medicine team.  Rory Percy, DO PGY-1, [place] [facility] 06/15/2017 4:23 PM

## 2017-06-15 NOTE — Progress Notes (Signed)
Upper Extremity Vein Map  Right Cephalic  Segment Diameter Depth Comment  1. Axilla 3.67mm 1.55mm   2. Mid upper arm 1.18mm 1.66mm   3. Above [facility] 0.32mm 1.40mm   4. In AC 1.30mm 0.21mm   5. Below AC 2.70mm 2.66mm   6. Mid forearm 2.63mm 1.46mm   7. Wrist   Not visualized                  Right Basilic  Segment Diameter Depth Comment  1. Axilla   Not visualized  2. Mid upper arm 1.69mm 14.61mm   3. Above [facility]   Not visualized  4. In [facility]   Not visualized  5. Below [facility]   Not visualized  6. Mid forearm   Not visualized  7. Wrist   Not visualized                   Left Cephalic  Segment Diameter Depth Comment  1. Axilla 3.81mm 1.49mm   2. Mid upper arm 2.38mm 2.81mm   3. Above [facility] 1.52mm 1.15mm   4. In [facility] 1.29mm 2.5mm   5. Below AC 3.22mm 4.37mm   6. Mid forearm 3.73mm 2.22mm Branch  7. Wrist 1.14mm 1.59mm                   Left Basilic  Segment Diameter Depth Comment  1. Axilla 2.62mm 16.23mm   2. Mid upper arm 3.40mm 4.98mm   3. Above AC 4.79mm 4.23mm   4. In [facility] 3.88mm 5.6mm Branch  5. Below AC 2.25mm 1.108mm   6. Mid forearm 2.24mm 1.76mm   7. Wrist 1.51mm 1.25mm                    06/15/2017 6:13 PM Maudry Mayhew, BS, RVT, RDCS, RDMS

## 2017-06-15 NOTE — SANE Note (Signed)
Follow-up Phone Call  Patient gives verbal consent for a FNE/SANE follow-up phone call in 48-72 hours: no Patient's telephone number: patient unsure without her phone and does not currently have her phone Patient gives verbal consent to leave voicemail at the phone number listed above: No DO NOT CALL between the hours of: Patient does not give consent to follow up, states, "I'll probably be in the hospital in 2 days, after that I may call the support people because I've been feeling all the things you said, but I just want to move on."
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
## 2017-06-15 NOTE — Progress Notes (Signed)
Rehab Admissions Coordinator Note:  Patient was screened by Retta Diones for appropriateness for an Inpatient Acute Rehab Consult.  At this time, we are recommending [facility].  If she has help after discharge, could also consider inpatient rehab consult.  Call me for questions.  Retta Diones 06/15/2017, 8:52 AM  I can be reached at [phone].
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
## 2017-06-15 NOTE — Progress Notes (Signed)
Initial Nutrition Assessment  DOCUMENTATION CODES:   Non-severe (moderate) malnutrition in context of chronic illness  INTERVENTION:   Nepro Shake po BID, each supplement provides 425 kcal and 19 grams protein  NUTRITION DIAGNOSIS:   Moderate Malnutrition related to chronic illness(CKD V, HTN, polysubstance abuse) as evidenced by moderate fat depletion, mild muscle depletion.  GOAL:   Patient will meet greater than or equal to 90% of their needs  MONITOR:   PO intake, Supplement acceptance, Labs, Weight trends  REASON FOR ASSESSMENT:   Malnutrition Screening Tool    ASSESSMENT:   67 yo admitted with subacute CVA with polysubstance abuse and HTN. Pt with CKD V not yet on HD. Pt with hx of hepatitis C, gastric ulcer  Pt alert, very flat affect on visit today Recorded po intake 15% at lunch today, 50% at breakfast. Pt ate 100% at dinner last night. Pt reports she had no appetite PTA but unable to elaborate much on what she has been eating/drinking. Just states "not much."  Pt reports she has lost weight; reports UBW around 140 pounds. Current wt of 109 pounds (22% wt loss). According to wt encounters, pt weighed 114 pounds in June of 2018, 123 pounds in August of 2017 and 139 pounds in August of 2015. Appears gradual consistent wt loss over several years but not significant per time frame.    Labs: phosphorus 5.3, sodium 133 Meds: LR at 75 ml/hr   NUTRITION - FOCUSED PHYSICAL EXAM:    Most Recent Value  Orbital Region  Moderate depletion  Upper Arm Region  Moderate depletion  Thoracic and Lumbar Region  Mild depletion  Buccal Region  Moderate depletion  Temple Region  Mild depletion  Clavicle Bone Region  Mild depletion  Clavicle and Acromion Bone Region  Mild depletion  Scapular Bone Region  Mild depletion  Dorsal Hand  Mild depletion  Patellar Region  Mild depletion  Anterior Thigh Region  Mild depletion  Posterior Calf Region  Mild depletion  Edema (RD  Assessment)  None       Diet Order:  Diet renal with fluid restriction Fluid restriction: 1200 mL Fluid; Room service appropriate? Yes; Fluid consistency: Thin Diet NPO time specified Except for: Sips with Meds  EDUCATION NEEDS:   Not appropriate for education at this time  Skin:  Skin Assessment: Reviewed RN Assessment  Last BM:  06/13/17  Height:   Ht Readings from Last 1 Encounters:  06/14/17 5\' 2"  (1.575 m)    Weight:   Wt Readings from Last 1 Encounters:  06/15/17 109 lb 12.6 oz (49.8 kg)    Ideal Body Weight:     BMI:  Body mass index is 20.08 kg/m.  Estimated Nutritional Needs:   Kcal:  1500-1750 kcals  Protein:  75-88 g  Fluid:  1.3 L   Kerman Passey MS, RD, LDN, CNSC [phone] Pager  [phone] Weekend/On-Call Pager
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
## 2017-06-16 ENCOUNTER — Inpatient Hospital Stay (HOSPITAL_COMMUNITY): Payer: Medicare Other

## 2017-06-16 ENCOUNTER — Encounter (HOSPITAL_COMMUNITY)
Admission: EM | Disposition: A | Discharge status: Skilled Nursing Facility | Payer: Self-pay | Source: Home / Self Care | Attending: Family Medicine

## 2017-06-16 ENCOUNTER — Inpatient Hospital Stay (HOSPITAL_COMMUNITY): Payer: Medicare Other | Admitting: Anesthesiology

## 2017-06-16 DIAGNOSIS — R7989 Other specified abnormal findings of blood chemistry: Secondary | ICD-10-CM

## 2017-06-16 DIAGNOSIS — N185 Chronic kidney disease, stage 5: Secondary | ICD-10-CM

## 2017-06-16 HISTORY — PX: AV FISTULA PLACEMENT: SHX1204

## 2017-06-16 HISTORY — PX: INSERTION OF DIALYSIS CATHETER: SHX1324

## 2017-06-16 LAB — BASIC METABOLIC PANEL
ANION GAP: 10 (ref 5–15)
BUN: 70 mg/dL — AB (ref 6–20)
CHLORIDE: 113 mmol/L — AB (ref 101–111)
CO2: 18 mmol/L — ABNORMAL LOW (ref 22–32)
Calcium: 7.2 mg/dL — ABNORMAL LOW (ref 8.9–10.3)
Creatinine, Ser: 8.68 mg/dL — ABNORMAL HIGH (ref 0.44–1.00)
GFR calc Af Amer: 5 mL/min — ABNORMAL LOW (ref >60)
GFR calc non Af Amer: 4 mL/min — ABNORMAL LOW (ref >60)
GLUCOSE: 91 mg/dL (ref 65–99)
POTASSIUM: 4 mmol/L (ref 3.5–5.1)
SODIUM: 141 mmol/L (ref 135–145)

## 2017-06-16 LAB — CBC
HEMATOCRIT: 27.4 % — AB (ref 36.0–46.0)
HEMOGLOBIN: 8.9 g/dL — AB (ref 12.0–15.0)
MCH: 28.4 pg (ref 26.0–34.0)
MCHC: 32.5 g/dL (ref 30.0–36.0)
MCV: 87.5 fL (ref 78.0–100.0)
Platelets: 277 10*3/uL (ref 150–400)
RBC: 3.13 MIL/uL — ABNORMAL LOW (ref 3.87–5.11)
RDW: 15.6 % — AB (ref 11.5–15.5)
WBC: 6.3 10*3/uL (ref 4.0–10.5)

## 2017-06-16 LAB — PROTEIN ELECTROPHORESIS, SERUM
A/G RATIO SPE: 0.8 (ref 0.7–1.7)
ALBUMIN ELP: 2.6 g/dL — AB (ref 2.9–4.4)
ALPHA-1-GLOBULIN: 0.2 g/dL (ref 0.0–0.4)
Alpha-2-Globulin: 0.9 g/dL (ref 0.4–1.0)
Beta Globulin: 1 g/dL (ref 0.7–1.3)
GAMMA GLOBULIN: 1.1 g/dL (ref 0.4–1.8)
Globulin, Total: 3.2 g/dL (ref 2.2–3.9)
M-Spike, %: 0.3 g/dL — ABNORMAL HIGH
Total Protein ELP: 5.8 g/dL — ABNORMAL LOW (ref 6.0–8.5)

## 2017-06-16 LAB — RENAL FUNCTION PANEL
ANION GAP: 14 (ref 5–15)
Albumin: 2.2 g/dL — ABNORMAL LOW (ref 3.5–5.0)
BUN: 72 mg/dL — AB (ref 6–20)
CHLORIDE: 112 mmol/L — AB (ref 101–111)
CO2: 15 mmol/L — ABNORMAL LOW (ref 22–32)
Calcium: 7.1 mg/dL — ABNORMAL LOW (ref 8.9–10.3)
Creatinine, Ser: 8.5 mg/dL — ABNORMAL HIGH (ref 0.44–1.00)
GFR calc Af Amer: 5 mL/min — ABNORMAL LOW (ref >60)
GFR calc non Af Amer: 4 mL/min — ABNORMAL LOW (ref >60)
GLUCOSE: 89 mg/dL (ref 65–99)
POTASSIUM: 3.9 mmol/L (ref 3.5–5.1)
Phosphorus: 5.7 mg/dL — ABNORMAL HIGH (ref 2.5–4.6)
Sodium: 141 mmol/L (ref 135–145)

## 2017-06-16 LAB — SURGICAL PCR SCREEN
MRSA, PCR: NEGATIVE
STAPHYLOCOCCUS AUREUS: NEGATIVE

## 2017-06-16 LAB — PARATHYROID HORMONE, INTACT (NO CA): PTH: 1017 pg/mL — ABNORMAL HIGH (ref 15–65)

## 2017-06-16 SURGERY — INSERTION OF DIALYSIS CATHETER
Anesthesia: General | Site: Groin | Laterality: Right

## 2017-06-16 MED ORDER — PHENYLEPHRINE 40 MCG/ML (10ML) SYRINGE FOR IV PUSH (FOR BLOOD PRESSURE SUPPORT)
PREFILLED_SYRINGE | INTRAVENOUS | Status: AC
  Filled 2017-06-16: qty 30

## 2017-06-16 MED ORDER — CALCITRIOL 1 MCG/ML IV SOLN
0.5000 ug | INTRAVENOUS | Status: DC
  Filled 2017-06-16: qty 0.5

## 2017-06-16 MED ORDER — HYDRALAZINE HCL 10 MG PO TABS
10.0000 mg | ORAL_TABLET | Freq: Four times a day (QID) | ORAL | Status: DC
  Administered 2017-06-16 – 2017-06-21 (×14): 10 mg via ORAL
  Filled 2017-06-16 (×15): qty 1

## 2017-06-16 MED ORDER — SODIUM CHLORIDE 0.9 % IV SOLN
INTRAVENOUS | Status: DC | PRN
  Administered 2017-06-16 (×2): via INTRAVENOUS

## 2017-06-16 MED ORDER — HEPARIN SODIUM (PORCINE) 1000 UNIT/ML IJ SOLN
INTRAMUSCULAR | Status: AC
  Filled 2017-06-16: qty 1

## 2017-06-16 MED ORDER — ONDANSETRON HCL 4 MG/2ML IJ SOLN
INTRAMUSCULAR | Status: DC | PRN
  Administered 2017-06-16: 4 mg via INTRAVENOUS

## 2017-06-16 MED ORDER — OXYCODONE HCL 5 MG PO TABS
5.0000 mg | ORAL_TABLET | Freq: Once | ORAL | Status: AC
  Administered 2017-06-16: 5 mg via ORAL
  Filled 2017-06-16: qty 1

## 2017-06-16 MED ORDER — NICOTINE 7 MG/24HR TD PT24
7.0000 mg | MEDICATED_PATCH | Freq: Every day | TRANSDERMAL | Status: DC
  Administered 2017-06-17 – 2017-06-23 (×7): 7 mg via TRANSDERMAL
  Filled 2017-06-16 (×7): qty 1

## 2017-06-16 MED ORDER — 0.9 % SODIUM CHLORIDE (POUR BTL) OPTIME
TOPICAL | Status: DC | PRN
  Administered 2017-06-16: 1000 mL

## 2017-06-16 MED ORDER — CLONIDINE HCL 0.3 MG PO TABS: 0.3000 mg | ORAL_TABLET | Freq: Two times a day (BID) | ORAL | Status: DC

## 2017-06-16 MED ORDER — MIDAZOLAM HCL 5 MG/5ML IJ SOLN
INTRAMUSCULAR | Status: DC | PRN
  Administered 2017-06-16: 2 mg via INTRAVENOUS

## 2017-06-16 MED ORDER — PROPOFOL 10 MG/ML IV BOLUS
INTRAVENOUS | Status: AC
  Filled 2017-06-16: qty 20

## 2017-06-16 MED ORDER — PROMETHAZINE HCL 25 MG/ML IJ SOLN: 6.2500 mg | INTRAMUSCULAR | Status: DC | PRN

## 2017-06-16 MED ORDER — SODIUM CHLORIDE 0.9 % IV SOLN
INTRAVENOUS | Status: DC | PRN
  Administered 2017-06-16: 500 mL

## 2017-06-16 MED ORDER — DEXAMETHASONE SODIUM PHOSPHATE 10 MG/ML IJ SOLN
INTRAMUSCULAR | Status: AC
  Filled 2017-06-16: qty 1

## 2017-06-16 MED ORDER — FENTANYL CITRATE (PF) 250 MCG/5ML IJ SOLN
INTRAMUSCULAR | Status: AC
  Filled 2017-06-16: qty 5

## 2017-06-16 MED ORDER — ONDANSETRON HCL 4 MG/2ML IJ SOLN
INTRAMUSCULAR | Status: AC
  Filled 2017-06-16: qty 2

## 2017-06-16 MED ORDER — PROPOFOL 10 MG/ML IV BOLUS
INTRAVENOUS | Status: DC | PRN
  Administered 2017-06-16: 150 mg via INTRAVENOUS

## 2017-06-16 MED ORDER — CLONIDINE HCL 0.3 MG/24HR TD PTWK
0.3000 mg | MEDICATED_PATCH | TRANSDERMAL | Status: DC
  Administered 2017-06-16 – 2017-06-23 (×2): 0.3 mg via TRANSDERMAL
  Filled 2017-06-16 (×2): qty 1

## 2017-06-16 MED ORDER — ALBUMIN HUMAN 5 % IV SOLN
INTRAVENOUS | Status: DC | PRN
  Administered 2017-06-16: 12:00:00 via INTRAVENOUS

## 2017-06-16 MED ORDER — CLONIDINE HCL 0.1 MG PO TABS
0.1000 mg | ORAL_TABLET | Freq: Two times a day (BID) | ORAL | Status: AC
  Administered 2017-06-17 (×2): 0.1 mg via ORAL
  Filled 2017-06-16 (×2): qty 1

## 2017-06-16 MED ORDER — HYDROMORPHONE HCL 1 MG/ML IJ SOLN: 0.2500 mg | INTRAMUSCULAR | Status: DC | PRN

## 2017-06-16 MED ORDER — CLONIDINE HCL 0.1 MG PO TABS
0.1000 mg | ORAL_TABLET | Freq: Three times a day (TID) | ORAL | Status: AC
  Administered 2017-06-16 (×2): 0.1 mg via ORAL
  Filled 2017-06-16 (×2): qty 1

## 2017-06-16 MED ORDER — CALCITRIOL 0.5 MCG PO CAPS
0.5000 ug | ORAL_CAPSULE | ORAL | Status: DC
  Administered 2017-06-17 – 2017-06-26 (×5): 0.5 ug via ORAL
  Filled 2017-06-16 (×4): qty 1

## 2017-06-16 MED ORDER — LIDOCAINE HCL (PF) 1 % IJ SOLN
INTRAMUSCULAR | Status: AC
  Filled 2017-06-16: qty 30

## 2017-06-16 MED ORDER — LIDOCAINE-EPINEPHRINE (PF) 1 %-1:200000 IJ SOLN
INTRAMUSCULAR | Status: AC
  Filled 2017-06-16: qty 30

## 2017-06-16 MED ORDER — SODIUM CHLORIDE 0.9 % IV SOLN
Freq: Once | INTRAVENOUS | Status: AC
  Administered 2017-06-16: 10:00:00 via INTRAVENOUS

## 2017-06-16 MED ORDER — EPHEDRINE SULFATE 50 MG/ML IJ SOLN
INTRAMUSCULAR | Status: DC | PRN
  Administered 2017-06-16 (×5): 10 mg via INTRAVENOUS

## 2017-06-16 MED ORDER — LIDOCAINE HCL (CARDIAC) 20 MG/ML IV SOLN
INTRAVENOUS | Status: DC | PRN
  Administered 2017-06-16: 100 mg via INTRAVENOUS

## 2017-06-16 MED ORDER — MIDAZOLAM HCL 2 MG/2ML IJ SOLN
INTRAMUSCULAR | Status: AC
  Filled 2017-06-16: qty 2

## 2017-06-16 MED ORDER — HEPARIN SODIUM (PORCINE) 1000 UNIT/ML IJ SOLN
INTRAMUSCULAR | Status: DC | PRN
  Administered 2017-06-16: 6000 [IU]

## 2017-06-16 MED ORDER — EPHEDRINE 5 MG/ML INJ
INTRAVENOUS | Status: AC
  Filled 2017-06-16: qty 20

## 2017-06-16 MED ORDER — CLONIDINE HCL 0.1 MG PO TABS: 0.1000 mg | ORAL_TABLET | Freq: Two times a day (BID) | ORAL | Status: DC

## 2017-06-16 MED ORDER — DEXAMETHASONE SODIUM PHOSPHATE 10 MG/ML IJ SOLN
INTRAMUSCULAR | Status: DC | PRN
  Administered 2017-06-16: 10 mg via INTRAVENOUS

## 2017-06-16 MED ORDER — TRAMADOL HCL 50 MG PO TABS
50.0000 mg | ORAL_TABLET | Freq: Four times a day (QID) | ORAL | Status: DC | PRN
  Administered 2017-06-16 – 2017-07-02 (×46): 50 mg via ORAL
  Filled 2017-06-16 (×46): qty 1

## 2017-06-16 MED ORDER — FENTANYL CITRATE (PF) 100 MCG/2ML IJ SOLN
INTRAMUSCULAR | Status: DC | PRN
  Administered 2017-06-16: 50 ug via INTRAVENOUS
  Administered 2017-06-16 (×2): 25 ug via INTRAVENOUS
  Administered 2017-06-16: 100 ug via INTRAVENOUS

## 2017-06-16 SURGICAL SUPPLY — 67 items
ARMBAND PINK RESTRICT EXTREMIT (MISCELLANEOUS) ×6 IMPLANT
BAG DECANTER FOR FLEXI CONT (MISCELLANEOUS) ×3 IMPLANT
BIOPATCH RED 1 DISK 7.0 (GAUZE/BANDAGES/DRESSINGS) ×3 IMPLANT
CANISTER SUCT 3000ML PPV (MISCELLANEOUS) ×3 IMPLANT
CATH ANGIO 5F BER 65CM (CATHETERS) ×3 IMPLANT
CATH PALINDROME RT-P 15FX19CM (CATHETERS) IMPLANT
CATH PALINDROME RT-P 15FX23CM (CATHETERS) IMPLANT
CATH PALINDROME RT-P 15FX28CM (CATHETERS) IMPLANT
CATH PALINDROME RT-P 15FX55CM (CATHETERS) ×3 IMPLANT
CATH STRAIGHT 5FR 65CM (CATHETERS) IMPLANT
CLIP VESOCCLUDE MED 6/CT (CLIP) ×3 IMPLANT
CLIP VESOCCLUDE SM WIDE 6/CT (CLIP) ×3 IMPLANT
COVER PROBE W GEL 5X96 (DRAPES) ×6 IMPLANT
COVER SURGICAL LIGHT HANDLE (MISCELLANEOUS) ×3 IMPLANT
DECANTER SPIKE VIAL GLASS SM (MISCELLANEOUS) ×3 IMPLANT
DERMABOND ADVANCED (GAUZE/BANDAGES/DRESSINGS) ×2
DERMABOND ADVANCED .7 DNX12 (GAUZE/BANDAGES/DRESSINGS) ×4 IMPLANT
DRAPE C-ARM 42X72 X-RAY (DRAPES) ×3 IMPLANT
DRAPE CHEST BREAST 15X10 FENES (DRAPES) ×3 IMPLANT
DRAPE ORTHO SPLIT 77X108 STRL (DRAPES) ×2
DRAPE SURG ORHT 6 SPLT 77X108 (DRAPES) ×4 IMPLANT
DRSG OPSITE POSTOP 4X6 (GAUZE/BANDAGES/DRESSINGS) ×3 IMPLANT
ELECT REM PT RETURN 9FT ADLT (ELECTROSURGICAL) ×3
ELECTRODE REM PT RTRN 9FT ADLT (ELECTROSURGICAL) ×2 IMPLANT
GAUZE SPONGE 4X4 12PLY STRL LF (GAUZE/BANDAGES/DRESSINGS) ×3 IMPLANT
GAUZE SPONGE 4X4 16PLY XRAY LF (GAUZE/BANDAGES/DRESSINGS) ×3 IMPLANT
GLOVE BIO SURGEON STRL SZ7 (GLOVE) ×3 IMPLANT
GLOVE BIOGEL M 6.5 STRL (GLOVE) ×6 IMPLANT
GLOVE BIOGEL M 7.0 STRL (GLOVE) ×3 IMPLANT
GLOVE BIOGEL PI IND STRL 6.5 (GLOVE) ×2 IMPLANT
GLOVE BIOGEL PI IND STRL 7.5 (GLOVE) ×2 IMPLANT
GLOVE BIOGEL PI INDICATOR 6.5 (GLOVE) ×1
GLOVE BIOGEL PI INDICATOR 7.5 (GLOVE) ×1
GOWN STRL REUS W/ TWL LRG LVL3 (GOWN DISPOSABLE) ×6 IMPLANT
GOWN STRL REUS W/ TWL XL LVL3 (GOWN DISPOSABLE) ×2 IMPLANT
GOWN STRL REUS W/TWL LRG LVL3 (GOWN DISPOSABLE) ×3
GOWN STRL REUS W/TWL XL LVL3 (GOWN DISPOSABLE) ×1
GUIDEWIRE AMPLATZ STIFF 0.35 (WIRE) ×3 IMPLANT
HEMOSTAT SPONGE AVITENE ULTRA (HEMOSTASIS) IMPLANT
KIT BASIN OR (CUSTOM PROCEDURE TRAY) ×3 IMPLANT
KIT ROOM TURNOVER OR (KITS) ×3 IMPLANT
NEEDLE 18GX1X1/2 (RX/OR ONLY) (NEEDLE) ×3 IMPLANT
NEEDLE HYPO 25GX1X1/2 BEV (NEEDLE) ×3 IMPLANT
NS IRRIG 1000ML POUR BTL (IV SOLUTION) ×3 IMPLANT
PACK CV ACCESS (CUSTOM PROCEDURE TRAY) ×3 IMPLANT
PACK SURGICAL SETUP 50X90 (CUSTOM PROCEDURE TRAY) ×3 IMPLANT
PAD ARMBOARD 7.5X6 YLW CONV (MISCELLANEOUS) ×6 IMPLANT
SET MICROPUNCTURE 5F STIFF (MISCELLANEOUS) IMPLANT
SHEATH AVANTI 11CM 5FR (MISCELLANEOUS) ×3 IMPLANT
SOAP 2 % CHG 4 OZ (WOUND CARE) ×3 IMPLANT
SUT ETHILON 3 0 PS 1 (SUTURE) ×3 IMPLANT
SUT MNCRL AB 4-0 PS2 18 (SUTURE) ×3 IMPLANT
SUT PROLENE 6 0 BV (SUTURE) IMPLANT
SUT PROLENE 7 0 BV 1 (SUTURE) ×6 IMPLANT
SUT VIC AB 3-0 SH 27 (SUTURE) ×1
SUT VIC AB 3-0 SH 27X BRD (SUTURE) ×2 IMPLANT
SYR 10ML LL (SYRINGE) ×3 IMPLANT
SYR 20CC LL (SYRINGE) ×6 IMPLANT
SYR 3ML LL SCALE MARK (SYRINGE) ×3 IMPLANT
SYR 5ML LL (SYRINGE) ×3 IMPLANT
SYR CONTROL 10ML LL (SYRINGE) ×3 IMPLANT
TOWEL GREEN STERILE (TOWEL DISPOSABLE) ×3 IMPLANT
TOWEL GREEN STERILE FF (TOWEL DISPOSABLE) ×3 IMPLANT
UNDERPAD 30X30 (UNDERPADS AND DIAPERS) ×3 IMPLANT
WATER STERILE IRR 1000ML POUR (IV SOLUTION) ×3 IMPLANT
WIRE AMPLATZ SS-J .035X180CM (WIRE) IMPLANT
WIRE BENTSON .035X145CM (WIRE) ×3 IMPLANT

## 2017-06-16 NOTE — Op Note (Signed)
OPERATIVE NOTE  PROCEDURE: 1.  Left first stage basilic vein transposition  2.  Right femoral vein tunneled dialysis catheter placement 3.  Right femoral vein tunneled dialysis catheter exchange 4.  Right vein cannulation under ultrasound guidance 5.  Bilateral internal jugular vein cannulation under ultrasound guidance  PRE-OPERATIVE DIAGNOSIS: hemodialysis dependence  POST-OPERATIVE DIAGNOSIS: same as above  SURGEON: Adele Barthel, MD  ANESTHESIA: general  ESTIMATED BLOOD LOSS: 30 cc  FINDING(S): 1.  Tips of the catheter in the right atrium on fluoroscopy 2.  Likely proximal right internal jugular vein stenosis vs occlusion 3.  Left brachial artery: 3.0 mm, somewhat diseased 4.  Left radial artery: palpable at end of case 5.  Easily palpable thrill in left arm at end of case  SPECIMEN(S):  none  INDICATIONS:   Ashley [name] is a 67 y.o. female who  presents with end stage renal disease.  The patient presents for tunneled dialysis catheter placement and left arm arteriovenous fistula vs graft placement.  The patient is aware the risks of tunneled dialysis catheter placement include but are not limited to: bleeding, infection, central venous injury, pneumothorax, possible venous stenosis, possible malpositioning in the venous system, and possible infections related to long-term catheter presence.  Risk, benefits, and alternatives to access surgery were discussed.  The patient is aware the risks include but are not limited to: bleeding, infection, steal syndrome, nerve damage, ischemic monomelic neuropathy, thrombosis, failure to mature, complications related to venous hypertension, need for additional procedures, death and stroke.  The patient agrees to proceed forward with the procedure.   DESCRIPTION: After written full informed consent was obtained from the patient, the patient was taken back to the operating room.  Prior to induction, the patient was given IV antibiotics.   After obtaining adequate sedation, the patient was prepped and draped in the standard fashion for a chest tunneled dialysis catheter placement.  I first cannulated the right internal jugular vein which appeared to be under sized on Sonosite.  I could easily cannulate the vein and aspirate but the wire would not pass proximally.  I pulled the wire and needle and held pressure for 2 minutes.  I then turned my attention to the left neck.  Under Sonosite guidance, I cannulated the left internal jugular vein.  The wire passed proximally but would not advanced into the superior vena cava, rather the wire looped into the right internal jugular vein.  Despite several manipulations, this failed to redirect the wire into the superior vena cava.  I loaded a 5-Fr sheath over the wire with some resistance.  I removed the wire and prior to loading the sheath with heparinized saline, I tried to aspirate the sheath.  No blood would aspirate.  I pulled the sheath back until I got some venous aspiration.  I then tried to loaded a wire into the sheath.  The wire would not easily pass.  I elected to pull the wire and sheath and held pressure for 3 minutes.  No change in blood pressure was noted.  At this point, I elected to attempt a femoral tunneled dialysis catheter.  The drapes were removed and then the patient was prepped and draped for a femoral tunneled dialysis catheter placement.  Under ultrasound guidance, the right femoral vein was cannulated with a 18 gauge needle.  A J-wire was then placed into the right atrium under fluroscopic guidance.  The wire was then secured in place with a clamp to the drapes.  I  then made stab incisions at the cannulation and exit sites.  I dissected from the exit site to the cannulation site with a metal dissector.  The wire was then unclamped and I removed the needle.  In the process of trying to dilator the skin tract and venotomy, the wire got bent.  I loaded the BER-2 catheter that  previously been pulled over the bent wire and then exchanged it for an Amplatz wire which was advanced into the right atrium under fluoroscopy.  I removed the catheter and then the skin and venotomy was dilated serially with graduated dilators over the Amplatz wire..  Finally, the dilator-sheath was placed under fluroscopic guidance into the iliac vein.  The dilator and wire were removed.    A 55 cm Palindrome catheter was placed under fluoroscopic guidance into the right atrium.  The sheath was broken and peeled away while holding the catheter cuff at the level of the skin.  The catheter was clamped with the plastic clamp.  The back of the catheter was transected revealing the two lumen.  The dissector was docked onto one of these lumens.  The dissector with the back end of this catheter was pulled through the subcutaneous tunnel.  The catheter was again clamped and the back end of the catheter was transected again to reveal the two lumens again.  The catheter collar was placed over the catheter and then two ports loaded onto the two lumens.  The catheter collar was snapped into place, securing the two ports.  The catheter appeared to be too long as the tip appeared to be above the superior vena cava, so exchange of the tunneled dialysis catheter and placement of a more distal exit site was going to be needed.    I pulled the tunneled dialysis catheter up in the groin incision and clamped the catheter transversely.  I transected the back end of the catheter and removed.  I pulled the cut end out the groin incision and then clamped one lumen of the catheter.  I released the transverse clamp and then loaded the Amplatz wire through the open lumen.  I removed the prior catheter and held pressure to the groin incision.  I loaded the dilator-sheath over the wire into the right groin.  I loaded the 55 cm Palindrome catheter over the wire into the right atrium.  I removed the Amplatz wire.  I made another exit site  stab incision another 5 cm distal from the prior incision.  Using the dissector, I dissected from this new exit incision to the groin incision.  The backend of the catheter was clamped with the plastic clamp.  The back of the catheter was transected revealing the two lumen.  The dissector was docked onto one of these lumens.  The dissector with the back end of this catheter was pulled through the subcutaneous tunnel.  The catheter was again clamped and the back end of the catheter was transected again to reveal the two lumens again.  The catheter collar was placed over the catheter and then two ports loaded onto the two lumens.  Each port was tested by aspirating and flushing.  No resistance was noted.  Each port was then thoroughly flushed with heparinized saline.  The catheter was secured in placed with two interrupted stitches of 3-0 Nylon tied to the catheter.  The cannulation incision and previous exit incisions were closed with a U-stitch of 4-0 Monocryl.  The cannulation and exit incisions were cleaned and sterile  bandages applied.  Each port was then loaded with concentrated heparin (1000 Units/mL) at the manufacturer recommended volumes to each port.  Sterile caps were applied to each port.    On completion fluoroscopy, the tips of the catheter were in the right atrium, and there was no evidence of pneumothorax.    At this point, the drapes were removed.  The patient was repositioned for a left arm access procedure.  The patient was reprepped and redraped for such procedure.  Using SonoSite guidance, the location of the patient's left brachial artery and basilic vein were marked out on the skin.   I looked for a cephalic vein, but this vein was not easily visible at this point.  I made a transverse incision at the level of the antecubitum and dissected through the subcutaneous tissue and fascia to gain exposure of the brachial artery.  This was noted to be 3 mm in diameter externally.  This was  dissected out proximally and distally and controlled with vessel loops .  I then dissected out the basilic vein.  This was noted to be 2-2.5 mm in diameter externally.  The distal segment of the vein was ligated with a  2-0 silk, and the vein was transected.  The proximal segment was interrogated with serial dilators.  The vein accepted up to a 3.5 mm dilator without any difficulty.  I then instilled the heparinized saline into the vein and clamped it.  At this point, I reset my exposure of the brachial artery and placed the artery under tension proximally and distally.  I made an arteriotomy with a #11 blade, and then I extended the arteriotomy with a Potts scissor.  I injected heparinized saline proximal and distal to this arteriotomy.  The vein was then sewn to the artery in an end-to-side configuration with a running stitch of 7-0 Prolene.  Prior to completing this anastomosis, I allowed the vein and artery to backbleed.  There was no evidence of clot from any vessels.  I completed the anastomosis in the usual fashion and then released all vessel loops and clamps.    There was a palpable thrill in the venous outflow, and there was a palpable radial pulse.  At this point, I irrigated out the surgical wound.  There was no further active bleeding.  The subcutaneous tissue was reapproximated with a running stitch of 3-0 Vicryl.  The skin was then reapproximated with a running subcuticular stitch of 4-0 Vicryl.  The skin was then cleaned, dried, and reinforced with Dermabond.  The patient tolerated this procedure well.    COMPLICATIONS: none  CONDITION: stable   Adele Barthel, MD, [facility] [place] Office: [phone] Pager: [phone]  06/16/2017, 11:55 AM

## 2017-06-16 NOTE — Interval H&P Note (Signed)
   History and Physical Update  The patient was interviewed and re-examined.  The patient's previous History and Physical has been reviewed and is unchanged from Dr. Donnetta Hutching consult.  There is no change in the plan of care: tunneled dialysis catheter placement, left arteriovenous fistula vs arteriovenous graft placement.   The patient is aware the risks of tunneled dialysis catheter placement include but are not limited to: bleeding, infection, central venous injury, pneumothorax, possible venous stenosis, possible malpositioning in the venous system, and possible infections related to long-term catheter presence.   Risk, benefits, and alternatives to access surgery were discussed.    The patient is aware the risks include but are not limited to: bleeding, infection, steal syndrome, nerve damage, ischemic monomelic neuropathy, thrombosis, failure to mature, complications related to venous hypertension, need for additional procedures, death and stroke.    The patient agrees to proceed forward with the procedure.   Adele Barthel, MD, FACS [facility] [place] Office: [phone] Pager: [phone]  06/16/2017, 10:21 AM

## 2017-06-16 NOTE — Progress Notes (Signed)
Asked to assist with transition from PO clonidine to clonidine patch.  Patient has been receiving clonidine 0.1mg  PO BID since 1/6 with consistently elevated blood pressures (152-199/76-101 in the past 24h).  Team entered for clonidine 0.3mg  transdermal patch to start today.  Due to delayed onset of action of clonidine patch, it is recommended to overlap with oral therapy for at least 48h.  Plan: -place clonidine patch today as ordered by IMTS team -clonidine 0.1mg  PO to be given two more times today to help with elevated blood pressure -clonidine 0.1mg  BID x2 doses to be given on 1/9, then PO clonidine to stop   Sahiba Granholm D. Angus Amini, PharmD, BCPS Clinical Pharmacist Clinical Phone for 06/16/2017 until 3:30pm: [phone] If after 3:30pm, please call main pharmacy at [phone] 06/16/2017 3:42 PM
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
## 2017-06-16 NOTE — Progress Notes (Signed)
[facility] ROUNDING NOTE   Subjective:   89F with severe renal failure, unclear if AoCKD or progressive CKD, has proteinuria, occ hematuria, in setting of chronic HCV infection. Here with subacute CVA.   Complements and serologies are negative so far  June 2018 -- creatinine  4 "s   This looks like chronic progressive renal failure    Objective:  Vital signs in last 24 hours:  Temp:  [98.2 F (36.8 C)-98.7 F (37.1 C)] 98.4 F (36.9 C) (01/08 0804) Pulse Rate:  [58-74] 58 (01/08 0804) Resp:  [17-18] 18 (01/08 0804) BP: (152-178)/(76-89) 178/76 (01/08 0804) SpO2:  [100 %] 100 % (01/08 0804) Weight:  [109 lb 5.6 oz (49.6 kg)] 109 lb 5.6 oz (49.6 kg) (01/07 2048)  Weight change: -7.1 oz (-0.2 kg) Filed Weights   06/14/17 2043 06/15/17 0259 06/15/17 2048  Weight: 109 lb 12.6 oz (49.8 kg) 109 lb 12.6 oz (49.8 kg) 109 lb 5.6 oz (49.6 kg)    Intake/Output: I/O last 3 completed shifts: In: 3110.8 [P.O.:1342; I.V.:1768.8] Out: 975 [Urine:975]   Intake/Output this shift:  No intake/output data recorded.  CVS- RRR RS- CTA ABD- BS present soft non-distended EXT- no edema   Basic Metabolic Panel: Recent Labs  Lab 06/13/17 2052 06/13/17 2122 06/14/17 0334 06/15/17 1338 06/16/17 0459  NA 134* 140 133* 137 141  141  K 4.6 4.9 3.9 4.2 4.0  3.9  CL 107 110 107 111 113*  112*  CO2 16*  --  14* 17* 18*  15*  GLUCOSE 89 89 90 119* 91  89  BUN 85* 81* 76* 66* 70*  72*  CREATININE 9.67* 10.30* 9.20* 8.81* 8.68*  8.50*  CALCIUM 7.6*  --  7.6* 7.3* 7.2*  7.1*  PHOS  --   --  5.3* 5.9* 5.7*    Liver Function Tests: Recent Labs  Lab 06/13/17 2052 06/14/17 0334 06/15/17 1338 06/16/17 0459  AST 16  --   --   --   ALT 15  --   --   --   ALKPHOS 77  --   --   --   BILITOT 0.3  --   --   --   PROT 6.7  --   --   --   ALBUMIN 2.8* 2.8* 2.5* 2.2*   No results for input(s): LIPASE, AMYLASE in the last 168 hours. No results for input(s): AMMONIA  in the last 168 hours.  CBC: Recent Labs  Lab 06/13/17 2052 06/13/17 2122 06/14/17 0334 06/16/17 0459  WBC 8.6  --  8.4 6.3  NEUTROABS 5.1  --   --   --   HGB 9.8* 9.9* 10.2* 8.9*  HCT 29.8* 29.0* 30.6* 27.4*  MCV 86.9  --  86.9 87.5  PLT 287  --  287 277    Cardiac Enzymes: No results for input(s): CKTOTAL, CKMB, CKMBINDEX, TROPONINI in the last 168 hours.  BNP: Invalid input(s): POCBNP  CBG: No results for input(s): GLUCAP in the last 168 hours.  Microbiology: Results for orders placed or performed during the hospital encounter of 06/13/17  Wet prep, genital     Status: Abnormal   Collection Time: 06/13/17 10:41 PM  Result Value Ref Range Status   Yeast Wet Prep HPF POC NONE SEEN NONE SEEN Final   Trich, Wet Prep PRESENT (A) NONE SEEN Final   Clue Cells Wet Prep HPF POC PRESENT (A) NONE SEEN Final   WBC, Wet Prep HPF POC TOO NUMEROUS TO COUNT (  A) NONE SEEN Final   Sperm NONE SEEN  Final    Coagulation Studies: Recent Labs    06/13/17 2050/09/05  LABPROT 13.6  INR 1.05    Urinalysis: Recent Labs    06/13/17 2123-09-05  COLORURINE YELLOW  LABSPEC 1.011  PHURINE 6.0  GLUCOSEU 50*  HGBUR SMALL*  BILIRUBINUR NEGATIVE  KETONESUR NEGATIVE  PROTEINUR 100*  NITRITE NEGATIVE  LEUKOCYTESUR LARGE*      Imaging: US Renal  Result Date: 06/15/2017 CLINICAL DATA:  Acute kidney injury. EXAM: RENAL / URINARY TRACT ULTRASOUND COMPLETE COMPARISON:  None. FINDINGS: Right Kidney: Length: 8.7 cm. Thinning of the renal parenchyma and diffusely increased renal echogenicity. No mass or hydronephrosis visualized. Left Kidney: Length: 8.7 cm. Thinning of the renal parenchyma and diffusely increased renal echogenicity. No mass or hydronephrosis visualized. Bladder: Appears normal for degree of bladder distention. IMPRESSION: Renal cortical thinning and increased echogenicity consistent with chronic medical renal disease. No hydronephrosis. Electronically Signed   By: Jeb Levering M.D.    On: 06/15/2017 02:17     Medications:   . [MAR Hold] cefUROXime (ZINACEF)  IV     . [MAR Hold]  stroke: mapping our early stages of recovery book   Does not apply Once  . [MAR Hold] amLODipine  10 mg Oral Daily  . [MAR Hold] aspirin EC  81 mg Oral Daily  . [MAR Hold] atorvastatin  40 mg Oral q1800  . [MAR Hold] calcium carbonate  200 mg of elemental calcium Oral TID WC  . [MAR Hold] cloNIDine  0.1 mg Oral BID  . [MAR Hold] enoxaparin (LOVENOX) injection  30 mg Subcutaneous Daily  . [MAR Hold] feeding supplement (NEPRO CARB STEADY)  237 mL Oral BID BM  . [MAR Hold] metroNIDAZOLE  500 mg Oral BID  . [MAR Hold] nicotine  14 mg Transdermal Daily  . [MAR Hold] pantoprazole  40 mg Oral Daily   [MAR Hold] acetaminophen, [MAR Hold] senna-docusate  Assessment/ Plan:  1. Renal Failure of unclear chronicity, severe; historical proteinuria, HCV Ab positive. Serologies pending and small echodense kidneys on ultrasound  2. Metabolic Acidosis, mild inc AG, likely 2/2 #1 3. HTN 4. Subacute CVA  Plan 1. Not uremic, no immediate HD needs  Although I suspect that dialysis will be needed unfortunately this admission  Baseline creatiinine about 4 in June 2016   Appreciate VVS and will initiate dialysis 2. Eval for GN: awaiting studies Ratio Kappa/lambda normal and complements normal   Not sure needs biopsy    3. Renal US small echodense  4. Daily weights, Daily Renal Panel, Strict I/Os, Avoid nephrotoxins (NSAIDs, judicious IV Contrast) 5. PTH 1017 start Calcitriol 0.33mcg MWF with dialysis      LOS: 2 Sparkles Mcneely W @TODAY @10 :08 AM
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
## 2017-06-16 NOTE — Anesthesia Preprocedure Evaluation (Addendum)
Anesthesia Evaluation  Patient identified by MRN, date of birth, ID band Patient awake    Reviewed: Allergy & Precautions, NPO status , Patient's Chart, lab work & pertinent test results  History of Anesthesia Complications Negative for: history of anesthetic complications  Airway Mallampati: II  TM Distance: >3 FB Neck ROM: Full    Dental  (+) Dental Advisory Given, Poor Dentition   Pulmonary Current Smoker,    Pulmonary exam normal        Cardiovascular hypertension, Pt. on medications Normal cardiovascular exam  Left ventricle:  The cavity size was normal. Wall thickness was increased increased in a pattern of mild to moderate LVH. Systolic function was normal. The estimated ejection fraction was in the range of 60% to 65%. Wall motion was normal;   Neuro/Psych CVA, Residual Symptoms negative psych ROS   GI/Hepatic hiatal hernia, PUD, (+) Hepatitis -, C  Endo/Other    Renal/GU Renal disease     Musculoskeletal   Abdominal   Peds  Hematology   Anesthesia Other Findings   Reproductive/Obstetrics                            Anesthesia Physical Anesthesia Plan  ASA: III  Anesthesia Plan: General   Post-op Pain Management:    Induction: Intravenous  PONV Risk Score and Plan: 2 and Ondansetron and Dexamethasone  Airway Management Planned: LMA  Additional Equipment:   Intra-op Plan:   Post-operative Plan: Extubation in OR  Informed Consent: I have reviewed the patients History and Physical, chart, labs and discussed the procedure including the risks, benefits and alternatives for the proposed anesthesia with the patient or authorized representative who has indicated his/her understanding and acceptance.   Dental advisory given  Plan Discussed with: CRNA, Anesthesiologist and Surgeon  Anesthesia Plan Comments:        Anesthesia Quick Evaluation
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
## 2017-06-16 NOTE — Progress Notes (Signed)
SLP Cancellation Note  Patient Details Name: Ashley [name] MRN: [id number] DOB: May 30, 1951   Cancelled treatment:       Reason Eval/Treat Not Completed: Patient at procedure or test/unavailable   Juan Quam Laurice 06/16/2017, 11:57 AM
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
## 2017-06-16 NOTE — Transfer of Care (Signed)
Immediate Anesthesia Transfer of Care Note  Patient: Ashley [name]  Procedure(s) Performed: INSERTION OF DIALYSIS CATHETER (Right Groin) ARTERIOVENOUS (AV) FISTULA CREATION LEFT ARM (Left )  Patient Location: PACU  Anesthesia Type:General  Level of Consciousness: awake and patient cooperative  Airway & Oxygen Therapy: Patient Spontanous Breathing  Post-op Assessment: Report given to RN and Post -op Vital signs reviewed and stable  Post vital signs: Reviewed and stable  Last Vitals:  Vitals:   06/16/17 0804 06/16/17 1316  BP: (!) 178/76   Pulse: (!) 58   Resp: 18   Temp: 36.9 C (P) 36.6 C  SpO2: 100%     Last Pain:  Vitals:   06/16/17 1316  TempSrc:   PainSc: (P) Asleep         Complications: No apparent anesthesia complications

## 2017-06-16 NOTE — Progress Notes (Signed)
Patients pain is unrelieved by current PRN medications. MD notified X2. Winfrey, MD stated that she would discuss this with her team and place orders. Will continue to monitor.

## 2017-06-16 NOTE — Anesthesia Procedure Notes (Signed)
Procedure Name: LMA Insertion Date/Time: 06/16/2017 11:54 AM Performed by: Lance Coon, CRNA Pre-anesthesia Checklist: Patient identified, Emergency Drugs available, Suction available, Timeout performed and Patient being monitored Patient Re-evaluated:Patient Re-evaluated prior to induction Oxygen Delivery Method: Circle system utilized Preoxygenation: Pre-oxygenation with 100% oxygen Induction Type: IV induction LMA: LMA inserted LMA Size: 3.0 Number of attempts: 1 Placement Confirmation: breath sounds checked- equal and bilateral and positive ETCO2 Tube secured with: Tape Dental Injury: Teeth and Oropharynx as per pre-operative assessment

## 2017-06-16 NOTE — Anesthesia Postprocedure Evaluation (Signed)
Anesthesia Post Note  Patient: Ashley [name]  Procedure(s) Performed: INSERTION OF DIALYSIS CATHETER (Right Groin) ARTERIOVENOUS (AV) FISTULA CREATION LEFT ARM (Left )     Patient location during evaluation: PACU Anesthesia Type: General Level of consciousness: sedated Pain management: pain level controlled Vital Signs Assessment: post-procedure vital signs reviewed and stable Respiratory status: spontaneous breathing and respiratory function stable Cardiovascular status: stable Postop Assessment: no apparent nausea or vomiting Anesthetic complications: no    Last Vitals:  Vitals:   06/16/17 1316 06/16/17 1330  BP:    Pulse:    Resp:    Temp: 36.6 C 36.6 C  SpO2:      Last Pain:  Vitals:   06/16/17 1330  TempSrc:   PainSc: 0-No pain                 Lanelle Lindo DANIEL

## 2017-06-17 ENCOUNTER — Encounter (HOSPITAL_COMMUNITY): Payer: Self-pay | Admitting: Vascular Surgery

## 2017-06-17 ENCOUNTER — Telehealth: Payer: Self-pay | Admitting: Vascular Surgery

## 2017-06-17 LAB — RENAL FUNCTION PANEL
ALBUMIN: 2.6 g/dL — AB (ref 3.5–5.0)
ANION GAP: 12 (ref 5–15)
BUN: 67 mg/dL — ABNORMAL HIGH (ref 6–20)
CALCIUM: 7.4 mg/dL — AB (ref 8.9–10.3)
CO2: 15 mmol/L — ABNORMAL LOW (ref 22–32)
CREATININE: 8.4 mg/dL — AB (ref 0.44–1.00)
Chloride: 108 mmol/L (ref 101–111)
GFR, EST AFRICAN AMERICAN: 5 mL/min — AB (ref >60)
GFR, EST NON AFRICAN AMERICAN: 4 mL/min — AB (ref >60)
Glucose, Bld: 120 mg/dL — ABNORMAL HIGH (ref 65–99)
PHOSPHORUS: 5.1 mg/dL — AB (ref 2.5–4.6)
Potassium: 4 mmol/L (ref 3.5–5.1)
SODIUM: 135 mmol/L (ref 135–145)

## 2017-06-17 NOTE — Telephone Encounter (Signed)
-----   Message from Mena Goes, RN sent at 06/16/2017  2:28 PM EST ----- Regarding: 6-8 weeks    ----- Message ----- From: Conrad Westfield, MD Sent: 06/16/2017   1:16 PM To: [facility] [address]  NEVAEN TREDWAY [phone] [birth year]  PROCEDURE: 1.  Left first stage basilic vein transposition  2.  Right femoral vein tunneled dialysis catheter placement 3.  Right femoral vein tunneled dialysis catheter exchange 4.  Right vein cannulation under ultrasound guidance 5.  Bilateral internal jugular vein cannulation under ultrasound guidance  Asst: Dagoberto Ligas, [facility]   Follow-up: 6-8 weeks

## 2017-06-17 NOTE — Progress Notes (Signed)
[facility] ROUNDING NOTE   Subjective:   51F with severe renal failure, unclear if AoCKD or progressive CKD, has proteinuria, occ hematuria, in setting of chronic HCV infection. Here with subacute CVA.   Complements and serologies are negative so far  June 2018 -- creatinine  4 "s  Now up to 8.4   This looks like chronic progressive renal failure    Objective:  Vital signs in last 24 hours:  Temp:  [97.8 F (36.6 C)-98.6 F (37 C)] 97.9 F (36.6 C) (01/09 0831) Pulse Rate:  [72-83] 72 (01/09 0831) Resp:  [16-18] 18 (01/09 0831) BP: (137-155)/(69-78) 144/69 (01/09 0831) SpO2:  [100 %] 100 % (01/09 0831) Weight:  [109 lb 12.6 oz (49.8 kg)] 109 lb 12.6 oz (49.8 kg) (01/08 2105)  Weight change: 7.1 oz (0.2 kg) Filed Weights   06/15/17 0259 06/15/17 2048 06/16/17 2105  Weight: 109 lb 12.6 oz (49.8 kg) 109 lb 5.6 oz (49.6 kg) 109 lb 12.6 oz (49.8 kg)    Intake/Output: I/O last 3 completed shifts: In: 2440 [P.O.:900; I.V.:700; IV Piggyback:250] Out: 1230 [Urine:1200; Blood:30]   Intake/Output this shift:  Total I/O In: -  Out: 350 [Urine:350]  CVS- RRR RS- CTA ABD- BS present soft non-distended EXT- no edema   Basic Metabolic Panel: Recent Labs  Lab 06/13/17 2052 06/13/17 2122 06/14/17 0334 06/15/17 1338 06/16/17 0459 06/17/17 0625  NA 134* 140 133* 137 141  141 135  K 4.6 4.9 3.9 4.2 4.0  3.9 4.0  CL 107 110 107 111 113*  112* 108  CO2 16*  --  14* 17* 18*  15* 15*  GLUCOSE 89 89 90 119* 91  89 120*  BUN 85* 81* 76* 66* 70*  72* 67*  CREATININE 9.67* 10.30* 9.20* 8.81* 8.68*  8.50* 8.40*  CALCIUM 7.6*  --  7.6* 7.3* 7.2*  7.1* 7.4*  PHOS  --   --  5.3* 5.9* 5.7* 5.1*    Liver Function Tests: Recent Labs  Lab 06/13/17 2052 06/14/17 0334 06/15/17 1338 06/16/17 0459 06/17/17 0625  AST 16  --   --   --   --   ALT 15  --   --   --   --   ALKPHOS 77  --   --   --   --   BILITOT 0.3  --   --   --   --   PROT 6.7  --   --    --   --   ALBUMIN 2.8* 2.8* 2.5* 2.2* 2.6*   No results for input(s): LIPASE, AMYLASE in the last 168 hours. No results for input(s): AMMONIA in the last 168 hours.  CBC: Recent Labs  Lab 06/13/17 2052 06/13/17 2122 06/14/17 0334 06/16/17 0459  WBC 8.6  --  8.4 6.3  NEUTROABS 5.1  --   --   --   HGB 9.8* 9.9* 10.2* 8.9*  HCT 29.8* 29.0* 30.6* 27.4*  MCV 86.9  --  86.9 87.5  PLT 287  --  287 277    Cardiac Enzymes: No results for input(s): CKTOTAL, CKMB, CKMBINDEX, TROPONINI in the last 168 hours.  BNP: Invalid input(s): POCBNP  CBG: No results for input(s): GLUCAP in the last 168 hours.  Microbiology: Results for orders placed or performed during the hospital encounter of 06/13/17  Wet prep, genital     Status: Abnormal   Collection Time: 06/13/17 10:41 PM  Result Value Ref Range Status   Yeast Wet Prep  HPF POC NONE SEEN NONE SEEN Final   Trich, Wet Prep PRESENT (A) NONE SEEN Final   Clue Cells Wet Prep HPF POC PRESENT (A) NONE SEEN Final   WBC, Wet Prep HPF POC TOO NUMEROUS TO COUNT (A) NONE SEEN Final   Sperm NONE SEEN  Final  Surgical PCR screen     Status: None   Collection Time: 06/16/17  8:31 AM  Result Value Ref Range Status   MRSA, PCR NEGATIVE NEGATIVE Final   Staphylococcus aureus NEGATIVE NEGATIVE Final    Comment: (NOTE) The Xpert SA Assay (FDA approved for NASAL specimens in patients 69 years of age and older), is one component of a comprehensive surveillance program. It is not intended to diagnose infection nor to guide or monitor treatment.     Coagulation Studies: No results for input(s): LABPROT, INR in the last 72 hours.  Urinalysis: No results for input(s): COLORURINE, LABSPEC, PHURINE, GLUCOSEU, HGBUR, BILIRUBINUR, KETONESUR, PROTEINUR, UROBILINOGEN, NITRITE, LEUKOCYTESUR in the last 72 hours.  Invalid input(s): APPERANCEUR    Imaging: Dg Chest Port 1 View  Result Date: 06/16/2017 CLINICAL DATA:  Dialysis catheter. EXAM: PORTABLE  CHEST 1 VIEW COMPARISON:  05/15/2013. FINDINGS: Right femoral dialysis catheter noted with its tip projected over the superior vena cava. Heart size stable. No pulmonary venous congestion. No focal infiltrate. No pleural effusion or pneumothorax. IMPRESSION: Right femoral dialysis catheter with its tip projected over the superior vena cava. No acute cardiopulmonary disease. Electronically Signed   By: Marcello Moores  Register   On: 06/16/2017 13:49   Dg Fluoro Guide Cv Line-no Report  Result Date: 06/16/2017 Fluoroscopy was utilized by the requesting physician.  No radiographic interpretation.     Medications:    .  stroke: mapping our early stages of recovery book   Does not apply Once  . amLODipine  10 mg Oral Daily  . aspirin EC  81 mg Oral Daily  . atorvastatin  40 mg Oral q1800  . calcitRIOL  0.5 mcg Oral Q M,W,F-HD  . calcium carbonate  200 mg of elemental calcium Oral TID WC  . cloNIDine  0.3 mg Transdermal Q Tue  . cloNIDine  0.1 mg Oral BID  . enoxaparin (LOVENOX) injection  30 mg Subcutaneous Daily  . feeding supplement (NEPRO CARB STEADY)  237 mL Oral BID BM  . hydrALAZINE  10 mg Oral Q6H  . metroNIDAZOLE  500 mg Oral BID  . nicotine  7 mg Transdermal Daily  . pantoprazole  40 mg Oral Daily   acetaminophen, senna-docusate, traMADol  Assessment/ Plan:  1. Renal Failure of unclear chronicity, severe; historical proteinuria, HCV Ab positive. Serologies pending and small echodense kidneys on ultrasound  2. Metabolic Acidosis, mild inc AG, likely 2/2 #1 3. HTN 4. Subacute CVA  Plan 1. Not uremic, no immediate HD needs  Although I suspect that dialysis will be needed unfortunately this admission  Baseline creatiinine about 4 in June 2016   Appreciate VVS and will initiate dialysis appreciate VVS assistance  2. Eval for GN: awaiting studies Ratio Kappa/lambda normal and complements normal   Not sure needs biopsy    3. Renal US small echodense  4. Daily weights, Daily Renal Panel,  Strict I/Os, Avoid nephrotoxins (NSAIDs, judicious IV Contrast) 5. PTH 1017 start Calcitriol 0.21mcg MWF with dialysis      LOS: 3 Buel Molder W @TODAY @10 :25 AM

## 2017-06-17 NOTE — Telephone Encounter (Signed)
Sched appt 07/29/17 at 11:30. Lm on hm#.

## 2017-06-17 NOTE — Progress Notes (Signed)
SLP Cancellation Note  Patient Details Name: Ashley [name] MRN: [id number] DOB: [birth year]   Cancelled treatment:       Reason Eval/Treat Not Completed: Patient declined, no reason specified; attempted x2; pt refused twice agitated "trying to take a nap."   Elvina Sidle, M.S., CCC-SLP 06/17/2017, 3:39 PM

## 2017-06-17 NOTE — Progress Notes (Addendum)
  Postoperative hemodialysis access     Date of Surgery:  06/16/17 Surgeon: Bridgett Larsson  Subjective:  C/o pain at the incision.  Denies pain in her hand.  PHYSICAL EXAMINATION:  Vitals:   06/16/17 2105 06/17/17 0419  BP: 139/71 (!) 148/78  Pulse: 77 81  Resp: 17 18  Temp: 98.1 F (36.7 C) 98.6 F (37 C)  SpO2: 100% 100%    Incision is clean and dry Sensation in digits is intact;  There is  Thrill  There is bruit. The graft/fistula is palpable    ASSESSMENT/PLAN:  Ashley [name] is a 67 y.o. year old female who is s/p  PROCEDURE: 1.  Left first stage basilic vein transposition  2.  Right femoral vein tunneled dialysis catheter placement 3.  Right femoral vein tunneled dialysis catheter exchange 4.  Right vein cannulation under ultrasound guidance 5.  Bilateral internal jugular vein cannulation under ultrasound guidance.  -graft/fistula is patent -pt does not have evidence of steal sx-only pain around the incision. -f/u with Dr. Bridgett Larsson in 4-6 weeks to check maturation of AVF -will sign off-call as needed.   Leontine Locket, PA-C [facility] [phone]

## 2017-06-17 NOTE — Progress Notes (Signed)
PT Cancellation Note  Patient Details Name: Ashley [name] MRN: [id number] DOB: [birth year]   Cancelled Treatment:    Reason Eval/Treat Not Completed: Patient declined, no reason specified. Despite max encouragement from therapist, pt declining OOB mobility at this time, stating "I don't feel like it right now". PT will continue to f/u with pt as available.   [place] 06/17/2017, 9:27 AM
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
## 2017-06-18 LAB — RENAL FUNCTION PANEL
Albumin: 2.4 g/dL — ABNORMAL LOW (ref 3.5–5.0)
Anion gap: 9 (ref 5–15)
BUN: 68 mg/dL — AB (ref 6–20)
CO2: 17 mmol/L — AB (ref 22–32)
CREATININE: 8.54 mg/dL — AB (ref 0.44–1.00)
Calcium: 7.8 mg/dL — ABNORMAL LOW (ref 8.9–10.3)
Chloride: 110 mmol/L (ref 101–111)
GFR calc Af Amer: 5 mL/min — ABNORMAL LOW (ref >60)
GFR, EST NON AFRICAN AMERICAN: 4 mL/min — AB (ref >60)
GLUCOSE: 87 mg/dL (ref 65–99)
POTASSIUM: 4.8 mmol/L (ref 3.5–5.1)
Phosphorus: 6.5 mg/dL — ABNORMAL HIGH (ref 2.5–4.6)
Sodium: 136 mmol/L (ref 135–145)

## 2017-06-18 NOTE — Progress Notes (Signed)
[facility] ROUNDING NOTE   Subjective:   67F with severe renal failure, unclear if AoCKD or progressive CKD, has proteinuria, occ hematuria, in setting of chronic HCV infection. Here with subacute CVA.   Complements and serologies are negative so far  June 2018 -- creatinine  4 "s  Now up to 8.4   This looks like chronic progressive renal failure    She started HD 1/10 will need CLIP and dialysis for today ordered    Objective:  Vital signs in last 24 hours:  Temp:  [98 F (36.7 C)-98.5 F (36.9 C)] 98.1 F (36.7 C) (01/10 0821) Pulse Rate:  [65-78] 65 (01/10 0821) Resp:  [18-20] 18 (01/10 0821) BP: (116-150)/(58-80) 141/79 (01/10 0821) SpO2:  [100 %] 100 % (01/10 0821) Weight:  [109 lb 5.6 oz (49.6 kg)] 109 lb 5.6 oz (49.6 kg) (01/09 2104)  Weight change: -7.1 oz (-0.2 kg) Filed Weights   06/15/17 2048 06/16/17 2105 06/17/17 2104  Weight: 109 lb 5.6 oz (49.6 kg) 109 lb 12.6 oz (49.8 kg) 109 lb 5.6 oz (49.6 kg)    Intake/Output: I/O last 3 completed shifts: In: 420 [P.O.:420] Out: 1350 [Urine:1350]   Intake/Output this shift:  No intake/output data recorded.  CVS- RRR RS- CTA ABD- BS present soft non-distended EXT- no edema   Basic Metabolic Panel: Recent Labs  Lab 06/14/17 0334 06/15/17 1338 06/16/17 0459 06/17/17 0625 06/18/17 0806  NA 133* 137 141  141 135 136  K 3.9 4.2 4.0  3.9 4.0 4.8  CL 107 111 113*  112* 108 110  CO2 14* 17* 18*  15* 15* 17*  GLUCOSE 90 119* 91  89 120* 87  BUN 76* 66* 70*  72* 67* 68*  CREATININE 9.20* 8.81* 8.68*  8.50* 8.40* 8.54*  CALCIUM 7.6* 7.3* 7.2*  7.1* 7.4* 7.8*  PHOS 5.3* 5.9* 5.7* 5.1* 6.5*    Liver Function Tests: Recent Labs  Lab 06/13/17 2052 06/14/17 0334 06/15/17 1338 06/16/17 0459 06/17/17 0625 06/18/17 0806  AST 16  --   --   --   --   --   ALT 15  --   --   --   --   --   ALKPHOS 77  --   --   --   --   --   BILITOT 0.3  --   --   --   --   --   PROT 6.7  --   --    --   --   --   ALBUMIN 2.8* 2.8* 2.5* 2.2* 2.6* 2.4*   No results for input(s): LIPASE, AMYLASE in the last 168 hours. No results for input(s): AMMONIA in the last 168 hours.  CBC: Recent Labs  Lab 06/13/17 2052 06/13/17 2122 06/14/17 0334 06/16/17 0459  WBC 8.6  --  8.4 6.3  NEUTROABS 5.1  --   --   --   HGB 9.8* 9.9* 10.2* 8.9*  HCT 29.8* 29.0* 30.6* 27.4*  MCV 86.9  --  86.9 87.5  PLT 287  --  287 277    Cardiac Enzymes: No results for input(s): CKTOTAL, CKMB, CKMBINDEX, TROPONINI in the last 168 hours.  BNP: Invalid input(s): POCBNP  CBG: No results for input(s): GLUCAP in the last 168 hours.  Microbiology: Results for orders placed or performed during the hospital encounter of 06/13/17  Wet prep, genital     Status: Abnormal   Collection Time: 06/13/17 10:41 PM  Result Value Ref Range  Status   Yeast Wet Prep HPF POC NONE SEEN NONE SEEN Final   Trich, Wet Prep PRESENT (A) NONE SEEN Final   Clue Cells Wet Prep HPF POC PRESENT (A) NONE SEEN Final   WBC, Wet Prep HPF POC TOO NUMEROUS TO COUNT (A) NONE SEEN Final   Sperm NONE SEEN  Final  Surgical PCR screen     Status: None   Collection Time: 06/16/17  8:31 AM  Result Value Ref Range Status   MRSA, PCR NEGATIVE NEGATIVE Final   Staphylococcus aureus NEGATIVE NEGATIVE Final    Comment: (NOTE) The Xpert SA Assay (FDA approved for NASAL specimens in patients 69 years of age and older), is one component of a comprehensive surveillance program. It is not intended to diagnose infection nor to guide or monitor treatment.     Coagulation Studies: No results for input(s): LABPROT, INR in the last 72 hours.  Urinalysis: No results for input(s): COLORURINE, LABSPEC, PHURINE, GLUCOSEU, HGBUR, BILIRUBINUR, KETONESUR, PROTEINUR, UROBILINOGEN, NITRITE, LEUKOCYTESUR in the last 72 hours.  Invalid input(s): APPERANCEUR    Imaging: Dg Chest Port 1 View  Result Date: 06/16/2017 CLINICAL DATA:  Dialysis catheter. EXAM:  PORTABLE CHEST 1 VIEW COMPARISON:  05/15/2013. FINDINGS: Right femoral dialysis catheter noted with its tip projected over the superior vena cava. Heart size stable. No pulmonary venous congestion. No focal infiltrate. No pleural effusion or pneumothorax. IMPRESSION: Right femoral dialysis catheter with its tip projected over the superior vena cava. No acute cardiopulmonary disease. Electronically Signed   By: Marcello Moores  Register   On: 06/16/2017 13:49   Dg Fluoro Guide Cv Line-no Report  Result Date: 06/16/2017 Fluoroscopy was utilized by the requesting physician.  No radiographic interpretation.     Medications:    .  stroke: mapping our early stages of recovery book   Does not apply Once  . amLODipine  10 mg Oral Daily  . aspirin EC  81 mg Oral Daily  . atorvastatin  40 mg Oral q1800  . calcitRIOL  0.5 mcg Oral Q M,W,F-HD  . calcium carbonate  200 mg of elemental calcium Oral TID WC  . cloNIDine  0.3 mg Transdermal Q Tue  . enoxaparin (LOVENOX) injection  30 mg Subcutaneous Daily  . feeding supplement (NEPRO CARB STEADY)  237 mL Oral BID BM  . hydrALAZINE  10 mg Oral Q6H  . metroNIDAZOLE  500 mg Oral BID  . nicotine  7 mg Transdermal Daily  . pantoprazole  40 mg Oral Daily   acetaminophen, senna-docusate, traMADol  Assessment/ Plan:  1. Renal Failure of unclear chronicity, severe; historical proteinuria, HCV Ab positive. Serologies pending and small echodense kidneys on ultrasound  2. Metabolic Acidosis, mild inc AG, likely 2/2 #1 3. HTN 4. Subacute CVA  Plan 1. Not uremic, no immediate HD needs  Although I suspect that dialysis will be needed unfortunately this admission  Baseline creatiinine about 4 in June 2016   Appreciate VVS   Continue  Dialysis #2 2. Eval for GN: awaiting studies Ratio Kappa/lambda normal and complements normal   Not sure needs biopsy    3. Renal US small echodense  4. Daily weights, Daily Renal Panel, Strict I/Os, Avoid nephrotoxins (NSAIDs, judicious IV  Contrast) 5. PTH 1017 start Calcitriol 0.27mcg MWF with dialysis      LOS: 4 Kiran Lapine W @TODAY @9 :57 AM
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
## 2017-06-18 NOTE — Progress Notes (Signed)
PT Cancellation Note  Patient Details Name: Ashley [name] MRN: [id number] DOB: 03/30/1951   Cancelled Treatment:    Reason Eval/Treat Not Completed: Patient declined, no reason specified. Despite max encouragement from therapist, pt refusing to perform any OOB activity at this time, reporting "I don't want to do it right now." PT will continue to f/u with pt as available.    [place] 06/18/2017, 8:30 AM

## 2017-06-18 NOTE — Care Management Important Message (Signed)
Important Message  Patient Details  Name: Ashley [name] MRN: [id number] Date of Birth: [birth year]   Medicare Important Message Given:  Yes    Talesha Ellithorpe Montine Circle 06/18/2017, 12:59 PM

## 2017-06-19 LAB — CBC
HCT: 25.5 % — ABNORMAL LOW (ref 36.0–46.0)
Hemoglobin: 8.3 g/dL — ABNORMAL LOW (ref 12.0–15.0)
MCH: 28.7 pg (ref 26.0–34.0)
MCHC: 32.5 g/dL (ref 30.0–36.0)
MCV: 88.2 fL (ref 78.0–100.0)
PLATELETS: 248 10*3/uL (ref 150–400)
RBC: 2.89 MIL/uL — ABNORMAL LOW (ref 3.87–5.11)
RDW: 15.6 % — AB (ref 11.5–15.5)
WBC: 10 10*3/uL (ref 4.0–10.5)

## 2017-06-19 LAB — URINALYSIS, ROUTINE W REFLEX MICROSCOPIC
Bilirubin Urine: NEGATIVE
GLUCOSE, UA: 50 mg/dL — AB
KETONES UR: NEGATIVE mg/dL
Nitrite: NEGATIVE
Protein, ur: 100 mg/dL — AB
Specific Gravity, Urine: 1.013 (ref 1.005–1.030)
pH: 5 (ref 5.0–8.0)

## 2017-06-19 LAB — RENAL FUNCTION PANEL
ALBUMIN: 2.4 g/dL — AB (ref 3.5–5.0)
ANION GAP: 8 (ref 5–15)
BUN: 55 mg/dL — ABNORMAL HIGH (ref 6–20)
CHLORIDE: 108 mmol/L (ref 101–111)
CO2: 23 mmol/L (ref 22–32)
Calcium: 7.9 mg/dL — ABNORMAL LOW (ref 8.9–10.3)
Creatinine, Ser: 6.51 mg/dL — ABNORMAL HIGH (ref 0.44–1.00)
GFR calc Af Amer: 7 mL/min — ABNORMAL LOW (ref >60)
GFR, EST NON AFRICAN AMERICAN: 6 mL/min — AB (ref >60)
GLUCOSE: 89 mg/dL (ref 65–99)
PHOSPHORUS: 6 mg/dL — AB (ref 2.5–4.6)
Potassium: 4.4 mmol/L (ref 3.5–5.1)
Sodium: 139 mmol/L (ref 135–145)

## 2017-06-19 LAB — HEPATITIS B SURFACE ANTIBODY,QUALITATIVE: Hep B S Ab: REACTIVE

## 2017-06-19 LAB — HEPATITIS B SURFACE ANTIGEN: HEP B S AG: NEGATIVE

## 2017-06-19 LAB — HEPATITIS B CORE ANTIBODY, TOTAL: Hep B Core Total Ab: POSITIVE — AB

## 2017-06-19 MED ORDER — KIDNEY FAILURE BOOK
Freq: Once | Status: DC
  Filled 2017-06-19: qty 1

## 2017-06-19 MED ORDER — OXYCODONE HCL 5 MG PO TABS
5.0000 mg | ORAL_TABLET | Freq: Once | ORAL | Status: AC
  Administered 2017-06-19: 5 mg via ORAL
  Filled 2017-06-19: qty 1

## 2017-06-19 MED ORDER — METRONIDAZOLE 500 MG PO TABS
500.0000 mg | ORAL_TABLET | Freq: Two times a day (BID) | ORAL | Status: DC
  Administered 2017-06-19 – 2017-06-22 (×8): 500 mg via ORAL
  Filled 2017-06-19 (×8): qty 1

## 2017-06-19 MED ORDER — CALCITRIOL 0.5 MCG PO CAPS
ORAL_CAPSULE | ORAL | Status: AC
  Filled 2017-06-19: qty 1

## 2017-06-19 NOTE — Progress Notes (Signed)
[facility] ROUNDING NOTE   Subjective:   67F with severe renal failure, unclear if AoCKD or progressive CKD, has proteinuria, occ hematuria, in setting of chronic HCV infection. Here with subacute CVA.   Complements and serologies are negative so far  June 2018 -- creatinine  4 "s  Now up to 8.4   This looks like chronic progressive renal failure    She  HD # 3  will need CLIP  Awaiting placement     Objective:  Vital signs in last 24 hours:  Temp:  [98 F (36.7 C)-98.7 F (37.1 C)] 98.7 F (37.1 C) (01/11 0450) Pulse Rate:  [68-77] 68 (01/11 0450) Resp:  [17-18] 18 (01/11 0450) BP: (120-164)/(65-81) 137/65 (01/11 0450) SpO2:  [96 %-100 %] 100 % (01/11 0450) Weight:  [119 lb 4.3 oz (54.1 kg)-122 lb 9.2 oz (55.6 kg)] 119 lb 4.3 oz (54.1 kg) (01/10 2051)  Weight change: 13 lb 3.6 oz (6 kg) Filed Weights   06/18/17 1550 06/18/17 1800 06/18/17 2051  Weight: 122 lb 9.2 oz (55.6 kg) 119 lb 4.3 oz (54.1 kg) 119 lb 4.3 oz (54.1 kg)    Intake/Output: I/O last 3 completed shifts: In: 480 [P.O.:480] Out: 2700 [Urine:1200; Other:1500]   Intake/Output this shift:  Total I/O In: 720 [P.O.:720] Out: -   CVS- RRR RS- CTA ABD- BS present soft non-distended EXT- no edema   Basic Metabolic Panel: Recent Labs  Lab 06/15/17 1338 06/16/17 0459 06/17/17 0625 06/18/17 0806 06/19/17 0447  NA 137 141  141 135 136 139  K 4.2 4.0  3.9 4.0 4.8 4.4  CL 111 113*  112* 108 110 108  CO2 17* 18*  15* 15* 17* 23  GLUCOSE 119* 91  89 120* 87 89  BUN 66* 70*  72* 67* 68* 55*  CREATININE 8.81* 8.68*  8.50* 8.40* 8.54* 6.51*  CALCIUM 7.3* 7.2*  7.1* 7.4* 7.8* 7.9*  PHOS 5.9* 5.7* 5.1* 6.5* 6.0*    Liver Function Tests: Recent Labs  Lab 06/13/17 2052  06/15/17 1338 06/16/17 0459 06/17/17 0625 06/18/17 0806 06/19/17 0447  AST 16  --   --   --   --   --   --   ALT 15  --   --   --   --   --   --   ALKPHOS 77  --   --   --   --   --   --   BILITOT 0.3   --   --   --   --   --   --   PROT 6.7  --   --   --   --   --   --   ALBUMIN 2.8*   < > 2.5* 2.2* 2.6* 2.4* 2.4*   < > = values in this interval not displayed.   No results for input(s): LIPASE, AMYLASE in the last 168 hours. No results for input(s): AMMONIA in the last 168 hours.  CBC: Recent Labs  Lab 06/13/17 2052 06/13/17 2122 06/14/17 0334 06/16/17 0459  WBC 8.6  --  8.4 6.3  NEUTROABS 5.1  --   --   --   HGB 9.8* 9.9* 10.2* 8.9*  HCT 29.8* 29.0* 30.6* 27.4*  MCV 86.9  --  86.9 87.5  PLT 287  --  287 277    Cardiac Enzymes: No results for input(s): CKTOTAL, CKMB, CKMBINDEX, TROPONINI in the last 168 hours.  BNP: Invalid input(s): POCBNP  CBG: No  results for input(s): GLUCAP in the last 168 hours.  Microbiology: Results for orders placed or performed during the hospital encounter of 06/13/17  Wet prep, genital     Status: Abnormal   Collection Time: 06/13/17 10:41 PM  Result Value Ref Range Status   Yeast Wet Prep HPF POC NONE SEEN NONE SEEN Final   Trich, Wet Prep PRESENT (A) NONE SEEN Final   Clue Cells Wet Prep HPF POC PRESENT (A) NONE SEEN Final   WBC, Wet Prep HPF POC TOO NUMEROUS TO COUNT (A) NONE SEEN Final   Sperm NONE SEEN  Final  Surgical PCR screen     Status: None   Collection Time: 06/16/17  8:31 AM  Result Value Ref Range Status   MRSA, PCR NEGATIVE NEGATIVE Final   Staphylococcus aureus NEGATIVE NEGATIVE Final    Comment: (NOTE) The Xpert SA Assay (FDA approved for NASAL specimens in patients 69 years of age and older), is one component of a comprehensive surveillance program. It is not intended to diagnose infection nor to guide or monitor treatment.     Coagulation Studies: No results for input(s): LABPROT, INR in the last 72 hours.  Urinalysis: No results for input(s): COLORURINE, LABSPEC, PHURINE, GLUCOSEU, HGBUR, BILIRUBINUR, KETONESUR, PROTEINUR, UROBILINOGEN, NITRITE, LEUKOCYTESUR in the last 72 hours.  Invalid input(s):  APPERANCEUR    Imaging: No results found.   Medications:    .  stroke: mapping our early stages of recovery book   Does not apply Once  . amLODipine  10 mg Oral Daily  . aspirin EC  81 mg Oral Daily  . atorvastatin  40 mg Oral q1800  . calcitRIOL  0.5 mcg Oral Q M,W,F-HD  . calcium carbonate  200 mg of elemental calcium Oral TID WC  . cloNIDine  0.3 mg Transdermal Q Tue  . enoxaparin (LOVENOX) injection  30 mg Subcutaneous Daily  . feeding supplement (NEPRO CARB STEADY)  237 mL Oral BID BM  . hydrALAZINE  10 mg Oral Q6H  . kidney failure book   Does not apply Once  . nicotine  7 mg Transdermal Daily  . oxyCODONE  5 mg Oral Once  . pantoprazole  40 mg Oral Daily   acetaminophen, senna-docusate, traMADol  Assessment/ Plan:  1. Renal Failure of unclear chronicity, severe; historical proteinuria, HCV Ab positive. Serologies pending and small echodense kidneys on ultrasound  2. Metabolic Acidosis, mild inc AG, likely 2/2 #1 3. HTN 4. Subacute CVA  Plan 1. Not uremic, no immediate HD needs  Although I suspect that dialysis will be needed unfortunately this admission  Baseline creatiinine about 4 in June 2016   Appreciate VVS   Continue  Dialysis #3 2. Eval for GN: awaiting studies Ratio Kappa/lambda normal and complements normal   Not sure needs biopsy    3. Renal US small echodense  4. Daily weights, Daily Renal Panel, Strict I/Os, Avoid nephrotoxins (NSAIDs, judicious IV Contrast) 5. PTH 1017 start Calcitriol 0.42mcg MWF with dialysis      LOS: 5 Zygmunt Mcglinn W @TODAY @11 :15 AM

## 2017-06-19 NOTE — Clinical Social Work Note (Signed)
Clinical Social Work Assessment  Patient Details  Name: Ashley [name] MRN: [id number] Date of Birth: 1950-09-03  Date of referral:  06/17/17               Reason for consult:  Facility Placement, Discharge Planning                Permission sought to share information with:  Family Supports Permission granted to share information::  Yes, Verbal Permission Granted  Name::     Robyne Peers  Agency::     Relationship::  Daughter  Contact Information:  [phone]  Housing/Transportation Living arrangements for the past 2 months:  [place] of Information:  Patient Patient Interpreter Needed:  None Criminal Activity/Legal Involvement Pertinent to Current Situation/Hospitalization:  No - Comment as needed Significant Relationships:  Adult Children Lives with:  Self Do you feel safe going back to the place where you live?  No Need for family participation in patient care:  Yes (Comment)  Care giving concerns:  Ms. Bogie reported that she lives alone and would have no one to be with her at discharge. Patient also advised CSW that her son and daughter work.   Social Worker assessment / plan:  CSW talked with patient at the bedside on 06/17/17 regarding discharge planning and recommendation of ST rehab. Patient was in bed and was alert, oriented and agreeable to talking with CSW. Ms. Gable reported that she lives alone and her daughter lives in [place] and her son lives in [place]. Patient indicated that she was totally self-sufficient and after discussing explaining the purpose of ST rehab, patient agreeable. CSW and patient also discussed her new ESRD diagnosis and starting dialysis.  Employment status:  Retired, Disabled (Comment on whether or not currently receiving Disability) Insurance information:  Managed Medicare PT Recommendations:  [place] / Referral to community resources:  [place]  Patient/Family's  Response to care: Ms. Kats expressed no concerns regarding her care during hospitalization.  Patient/Family's Understanding of and Emotional Response to Diagnosis, Current Treatment, and Prognosis:  Patient appeared to understand her health issues, reason for hospitalization and the benefits of ST rehab.  Emotional Assessment Appearance:  Appears stated age Attitude/Demeanor/Rapport:  Other(Appropriate) Affect (typically observed):  Appropriate, Pleasant Orientation:  Oriented to Self, Oriented to Situation, Oriented to Place, Oriented to  Time Alcohol / Substance use:  Tobacco Use, Alcohol Use, Illicit Drugs(Patient reported that she smokes, does not drink and uses illicit drugs) Psych involvement (Current and /or in the community):  No (Comment)  Discharge Needs  Concerns to be addressed:  Discharge Planning Concerns Readmission within the last 30 days:    Current discharge risk:  None Barriers to Discharge:  Continued Medical Work up   [employer] Mila [place], [place] 06/19/2017, 5:58 PM

## 2017-06-19 NOTE — Progress Notes (Signed)
  Speech Language Pathology Treatment: Cognitive-Linquistic  Patient Details Name: Ashley [name] MRN: [id number] DOB: 10/14/1950 Today's Date: 06/19/2017 Time: 8676-1950 SLP Time Calculation (min) (ACUTE ONLY): 10 min  Assessment / Plan / Recommendation Clinical Impression  F/u for cognitive-communication.  Pt with improved spontaneity of communication and pragmatics.  Demonstrates improved divergent naming, selective attention, and functional recall of events, meal choices for today with Mod I cues. Disposition will be SNF for rehab.  No further SLP needs identified - our services will sign off.   HPI HPI: 67 y.o. female with a PMH of HTN, HLD and Polysubstance Abuse who presents to the hospital with complaints of acute onset right sided weakness over one week ago. 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      SLP Plan  All goals met       Recommendations                   Follow up Recommendations: None SLP Visit Diagnosis: Cognitive communication deficit (D32.671) Plan: All goals met       GO                Juan Quam Laurice 06/19/2017, 9:56 AM

## 2017-06-19 NOTE — Care Management Note (Signed)
Case Management Note  Patient Details  Name: Ashley [name] MRN: [id number] Date of Birth: [birth year]  Subjective/Objective:                 + CVA, will SNF for rehab, also awaiting CLIP in progress. CSW following for SNF placement.   Action/Plan:   Expected Discharge Date:                  Expected Discharge Plan:  Skilled Nursing Facility  In-House Referral:  Clinical Social Work  Discharge planning Services  CM Consult  Post Acute Care Choice:    Choice offered to:     DME Arranged:    DME Agency:     HH Arranged:    [place] Agency:     Status of Service:  In process, will continue to follow  If discussed at Long Length of Stay Meetings, dates discussed:    Additional Comments:  Carles Collet, RN 06/19/2017, 2:43 PM

## 2017-06-19 NOTE — Clinical Social Work Note (Signed)
Bed search has been initiated for ST rehab and to date no bed offers. CSW will f/u with assistant Clinical SW Director regarding facilities that may consider patient.   Breyah Akhter Givens, MSW, LCSW Licensed Clinical Social Worker [place] [phone]
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
## 2017-06-19 NOTE — Progress Notes (Signed)
Patient is complaining of pain unrelieved by current PRN meds. MD notified and made aware. MD stated that he would talk to his colleague and possibly put in a order for pain medication. Will continue to monitor.
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
## 2017-06-19 NOTE — Progress Notes (Signed)
Patient is complaining of pain unrelieved by current PRN medications. MD notified. MD stated that he would come up and see the patient. Will continue to monitor.

## 2017-06-20 LAB — RENAL FUNCTION PANEL
ALBUMIN: 2.4 g/dL — AB (ref 3.5–5.0)
ANION GAP: 9 (ref 5–15)
BUN: 35 mg/dL — ABNORMAL HIGH (ref 6–20)
CALCIUM: 8.3 mg/dL — AB (ref 8.9–10.3)
CO2: 25 mmol/L (ref 22–32)
Chloride: 102 mmol/L (ref 101–111)
Creatinine, Ser: 4.98 mg/dL — ABNORMAL HIGH (ref 0.44–1.00)
GFR calc Af Amer: 9 mL/min — ABNORMAL LOW (ref >60)
GFR, EST NON AFRICAN AMERICAN: 8 mL/min — AB (ref >60)
GLUCOSE: 89 mg/dL (ref 65–99)
PHOSPHORUS: 4.9 mg/dL — AB (ref 2.5–4.6)
Potassium: 4.1 mmol/L (ref 3.5–5.1)
Sodium: 136 mmol/L (ref 135–145)
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
## 2017-06-20 NOTE — Progress Notes (Signed)
[facility] ROUNDING NOTE   Subjective:   31F with severe renal failure, unclear if AoCKD or progressive CKD, has proteinuria, occ hematuria, in setting of chronic HCV infection. Here with subacute CVA.   Complements and serologies are negative so far  June 2018 -- creatinine  4 "s  Now up to 8.4   This looks like chronic progressive renal failure    She  HD # 3  will need CLIP  Awaiting placement     Objective:  Vital signs in last 24 hours:  Temp:  [98.1 F (36.7 C)-99 F (37.2 C)] 99 F (37.2 C) (01/12 0529) Pulse Rate:  [74-86] 82 (01/12 0529) Resp:  [17-20] 18 (01/12 0529) BP: (140-162)/(55-83) 148/81 (01/12 0529) SpO2:  [98 %-100 %] 98 % (01/12 0529) Weight:  [120 lb 5.9 oz (54.6 kg)-122 lb 9.2 oz (55.6 kg)] 120 lb 5.9 oz (54.6 kg) (01/11 2100)  Weight change: 0 lb (0 kg) Filed Weights   06/19/17 1405 06/19/17 1650 06/19/17 2100  Weight: 122 lb 9.2 oz (55.6 kg) 120 lb 5.9 oz (54.6 kg) 120 lb 5.9 oz (54.6 kg)    Intake/Output: I/O last 3 completed shifts: In: 1200 [P.O.:1200] Out: 45 [Urine:2150; Other:1000]   Intake/Output this shift:  No intake/output data recorded.  CVS- RRR RS- CTA ABD- BS present soft non-distended EXT- no edema   Basic Metabolic Panel: Recent Labs  Lab 06/16/17 0459 06/17/17 0625 06/18/17 0806 06/19/17 0447 06/20/17 0601  NA 141  141 135 136 139 136  K 4.0  3.9 4.0 4.8 4.4 4.1  CL 113*  112* 108 110 108 102  CO2 18*  15* 15* 17* 23 25  GLUCOSE 91  89 120* 87 89 89  BUN 70*  72* 67* 68* 55* 35*  CREATININE 8.68*  8.50* 8.40* 8.54* 6.51* 4.98*  CALCIUM 7.2*  7.1* 7.4* 7.8* 7.9* 8.3*  PHOS 5.7* 5.1* 6.5* 6.0* 4.9*    Liver Function Tests: Recent Labs  Lab 06/13/17 2052  06/16/17 0459 06/17/17 0625 06/18/17 0806 06/19/17 0447 06/20/17 0601  AST 16  --   --   --   --   --   --   ALT 15  --   --   --   --   --   --   ALKPHOS 77  --   --   --   --   --   --   BILITOT 0.3  --   --   --   --    --   --   PROT 6.7  --   --   --   --   --   --   ALBUMIN 2.8*   < > 2.2* 2.6* 2.4* 2.4* 2.4*   < > = values in this interval not displayed.   No results for input(s): LIPASE, AMYLASE in the last 168 hours. No results for input(s): AMMONIA in the last 168 hours.  CBC: Recent Labs  Lab 06/13/17 2052 06/13/17 2122 06/14/17 0334 06/16/17 0459 06/19/17 1710  WBC 8.6  --  8.4 6.3 10.0  NEUTROABS 5.1  --   --   --   --   HGB 9.8* 9.9* 10.2* 8.9* 8.3*  HCT 29.8* 29.0* 30.6* 27.4* 25.5*  MCV 86.9  --  86.9 87.5 88.2  PLT 287  --  287 277 248    Cardiac Enzymes: No results for input(s): CKTOTAL, CKMB, CKMBINDEX, TROPONINI in the last 168 hours.  BNP: Invalid input(s):  POCBNP  CBG: No results for input(s): GLUCAP in the last 168 hours.  Microbiology: Results for orders placed or performed during the hospital encounter of 06/13/17  Wet prep, genital     Status: Abnormal   Collection Time: 06/13/17 10:41 PM  Result Value Ref Range Status   Yeast Wet Prep HPF POC NONE SEEN NONE SEEN Final   Trich, Wet Prep PRESENT (A) NONE SEEN Final   Clue Cells Wet Prep HPF POC PRESENT (A) NONE SEEN Final   WBC, Wet Prep HPF POC TOO NUMEROUS TO COUNT (A) NONE SEEN Final   Sperm NONE SEEN  Final  Surgical PCR screen     Status: None   Collection Time: 06/16/17  8:31 AM  Result Value Ref Range Status   MRSA, PCR NEGATIVE NEGATIVE Final   Staphylococcus aureus NEGATIVE NEGATIVE Final    Comment: (NOTE) The Xpert SA Assay (FDA approved for NASAL specimens in patients 53 years of age and older), is one component of a comprehensive surveillance program. It is not intended to diagnose infection nor to guide or monitor treatment.     Coagulation Studies: No results for input(s): LABPROT, INR in the last 72 hours.  Urinalysis: Recent Labs    06/19/17 1820  COLORURINE YELLOW  LABSPEC 1.013  PHURINE 5.0  GLUCOSEU 50*  HGBUR SMALL*  BILIRUBINUR NEGATIVE  KETONESUR NEGATIVE  PROTEINUR  100*  NITRITE NEGATIVE  LEUKOCYTESUR TRACE*      Imaging: No results found.   Medications:    .  stroke: mapping our early stages of recovery book   Does not apply Once  . amLODipine  10 mg Oral Daily  . aspirin EC  81 mg Oral Daily  . atorvastatin  40 mg Oral q1800  . calcitRIOL  0.5 mcg Oral Q M,W,F-HD  . calcium carbonate  200 mg of elemental calcium Oral TID WC  . cloNIDine  0.3 mg Transdermal Q Tue  . enoxaparin (LOVENOX) injection  30 mg Subcutaneous Daily  . feeding supplement (NEPRO CARB STEADY)  237 mL Oral BID BM  . hydrALAZINE  10 mg Oral Q6H  . kidney failure book   Does not apply Once  . metroNIDAZOLE  500 mg Oral Q12H  . nicotine  7 mg Transdermal Daily  . pantoprazole  40 mg Oral Daily   acetaminophen, senna-docusate, traMADol  Assessment/ Plan:  1. Renal Failure of unclear chronicity, severe; historical proteinuria, HCV Ab positive. Serologies pending and small echodense kidneys on ultrasound  2. Metabolic Acidosis, mild inc AG, likely 2/2 #1 3. HTN 4. Subacute CVA  Plan 1. Not uremic, no immediate HD needs  Although I suspect that dialysis will be needed unfortunately this admission  Baseline creatiinine about 4 in June 2016   Appreciate VVS   She has had 3 treatments so far and ws awaiting CLIP  2. Eval for GN: awaiting studies Ratio Kappa/lambda normal and complements normal   Not sure needs biopsy    3. Renal US small echodense  4. Daily weights, Daily Renal Panel, Strict I/Os, Avoid nephrotoxins (NSAIDs, judicious IV Contrast) 5. PTH 1017 start Calcitriol 0.50mcg MWF with dialysis      LOS: 6 Osmin Welz W @TODAY @11 :03 AM

## 2017-06-21 LAB — RENAL FUNCTION PANEL
ANION GAP: 11 (ref 5–15)
Albumin: 2.3 g/dL — ABNORMAL LOW (ref 3.5–5.0)
BUN: 51 mg/dL — ABNORMAL HIGH (ref 6–20)
CALCIUM: 8.3 mg/dL — AB (ref 8.9–10.3)
CO2: 22 mmol/L (ref 22–32)
Chloride: 101 mmol/L (ref 101–111)
Creatinine, Ser: 5.68 mg/dL — ABNORMAL HIGH (ref 0.44–1.00)
GFR calc non Af Amer: 7 mL/min — ABNORMAL LOW (ref >60)
GFR, EST AFRICAN AMERICAN: 8 mL/min — AB (ref >60)
GLUCOSE: 130 mg/dL — AB (ref 65–99)
POTASSIUM: 4.3 mmol/L (ref 3.5–5.1)
Phosphorus: 5.1 mg/dL — ABNORMAL HIGH (ref 2.5–4.6)
Sodium: 134 mmol/L — ABNORMAL LOW (ref 135–145)

## 2017-06-21 LAB — IRON AND TIBC
Iron: 28 ug/dL (ref 28–170)
Saturation Ratios: 14 % (ref 10.4–31.8)
TIBC: 196 ug/dL — AB (ref 250–450)
UIBC: 168 ug/dL

## 2017-06-21 MED ORDER — DARBEPOETIN ALFA 60 MCG/0.3ML IJ SOSY
60.0000 ug | PREFILLED_SYRINGE | INTRAMUSCULAR | Status: DC
  Administered 2017-06-22: 60 ug via INTRAVENOUS
  Filled 2017-06-21: qty 0.3

## 2017-06-21 MED ORDER — HYDRALAZINE HCL 25 MG PO TABS
25.0000 mg | ORAL_TABLET | Freq: Three times a day (TID) | ORAL | Status: DC
  Administered 2017-06-21 – 2017-07-02 (×29): 25 mg via ORAL
  Filled 2017-06-21 (×31): qty 1
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
## 2017-06-21 NOTE — Progress Notes (Signed)
Ashley [name]   Subjective:   48F with severe renal failure, unclear if AoCKD or progressive CKD, has proteinuria, occ hematuria, in setting of chronic HCV infection. Here with subacute CVA.   Complements and serologies are negative so far  June 2018 -- creatinine  4 "s  Now up to 8.4   This looks like chronic progressive renal failure    She  HD # 3  will need CLIP  Awaiting placement     She is MWF dialysis schedule and will continue this treatment     Objective:  Vital signs in last 24 hours:  Temp:  [98.1 F (36.7 C)-98.8 F (37.1 C)] 98.8 F (37.1 C) (01/13 0942) Pulse Rate:  [73-84] 80 (01/13 0942) Resp:  [16-18] 16 (01/13 0942) BP: (150-175)/(73-84) 170/84 (01/13 0942) SpO2:  [98 %-100 %] 99 % (01/13 0942) Weight:  [120 lb 2.4 oz (54.5 kg)] 120 lb 2.4 oz (54.5 kg) (01/12 2045)  Weight change: -6.8 oz (-1.1 kg) Filed Weights   06/19/17 1650 06/19/17 2100 06/20/17 2045  Weight: 120 lb 5.9 oz (54.6 kg) 120 lb 5.9 oz (54.6 kg) 120 lb 2.4 oz (54.5 kg)    Intake/Output: I/O last 3 completed shifts: In: 1320 [P.O.:1320] Out: 2275 [Urine:2275]   Intake/Output this shift:  Total I/O In: 240 [P.O.:240] Out: 0   CVS- RRR RS- CTA ABD- BS present soft non-distended EXT- no edema   Basic Metabolic Panel: Recent Labs  Lab 06/17/17 0625 06/18/17 0806 06/19/17 0447 06/20/17 0601 06/21/17 0726  NA 135 136 139 136 134*  K 4.0 4.8 4.4 4.1 4.3  CL 108 110 108 102 101  CO2 15* 17* 23 25 22   GLUCOSE 120* 87 89 89 130*  BUN 67* 68* 55* 35* 51*  CREATININE 8.40* 8.54* 6.51* 4.98* 5.68*  CALCIUM 7.4* 7.8* 7.9* 8.3* 8.3*  PHOS 5.1* 6.5* 6.0* 4.9* 5.1*    Liver Function Tests: Recent Labs  Lab 06/17/17 0625 06/18/17 0806 06/19/17 0447 06/20/17 0601 06/21/17 0726  ALBUMIN 2.6* 2.4* 2.4* 2.4* 2.3*   No results for input(s): LIPASE, AMYLASE in the last 168 hours. No results for input(s): AMMONIA in the last 168  hours.  CBC: Recent Labs  Lab 06/16/17 0459 06/19/17 1710  WBC 6.3 10.0  HGB 8.9* 8.3*  HCT 27.4* 25.5*  MCV 87.5 88.2  PLT 277 248    Cardiac Enzymes: No results for input(s): CKTOTAL, CKMB, CKMBINDEX, TROPONINI in the last 168 hours.  BNP: Invalid input(s): POCBNP  CBG: No results for input(s): GLUCAP in the last 168 hours.  Microbiology: Results for orders placed or performed during the hospital encounter of 06/13/17  Wet prep, genital     Status: Abnormal   Collection Time: 06/13/17 10:41 PM  Result Value Ref Range Status   Yeast Wet Prep HPF POC NONE SEEN NONE SEEN Final   Trich, Wet Prep PRESENT (A) NONE SEEN Final   Clue Cells Wet Prep HPF POC PRESENT (A) NONE SEEN Final   WBC, Wet Prep HPF POC TOO NUMEROUS TO COUNT (A) NONE SEEN Final   Sperm NONE SEEN  Final  Surgical PCR screen     Status: None   Collection Time: 06/16/17  8:31 AM  Result Value Ref Range Status   MRSA, PCR NEGATIVE NEGATIVE Final   Staphylococcus aureus NEGATIVE NEGATIVE Final    Comment: ([name]) The Xpert SA Assay (FDA approved for NASAL specimens in patients 16 years of age and older), is one component  of a comprehensive surveillance program. It is not intended to diagnose infection nor to guide or monitor treatment.     Coagulation Studies: No results for input(s): LABPROT, INR in the last 72 hours.  Urinalysis: Recent Labs    06/19/17 1820  COLORURINE YELLOW  LABSPEC 1.013  PHURINE 5.0  GLUCOSEU 50*  HGBUR SMALL*  BILIRUBINUR NEGATIVE  KETONESUR NEGATIVE  PROTEINUR 100*  NITRITE NEGATIVE  LEUKOCYTESUR TRACE*      Imaging: No results found.   Medications:    .  stroke: mapping our early stages of recovery book   Does not apply Once  . amLODipine  10 mg Oral Daily  . aspirin EC  81 mg Oral Daily  . atorvastatin  40 mg Oral q1800  . calcitRIOL  0.5 mcg Oral Q M,W,F-HD  . calcium carbonate  200 mg of elemental calcium Oral TID WC  . cloNIDine  0.3 mg Transdermal  Q Tue  . enoxaparin (LOVENOX) injection  30 mg Subcutaneous Daily  . feeding supplement (NEPRO CARB STEADY)  237 mL Oral BID BM  . hydrALAZINE  10 mg Oral Q6H  . kidney failure book   Does not apply Once  . metroNIDAZOLE  500 mg Oral Q12H  . nicotine  7 mg Transdermal Daily  . pantoprazole  40 mg Oral Daily   acetaminophen, senna-docusate, traMADol  Assessment/ Plan:  1. Renal Failure of unclear chronicity, severe; historical proteinuria, HCV Ab positive. Serologies pending and small echodense kidneys on ultrasound  2. Metabolic Acidosis, mild inc AG, likely 2/2 #1 3. HTN 4. Subacute CVA  Plan 1. Not uremic, no immediate HD needs  Although I suspect that dialysis will be needed unfortunately this admission  Baseline creatiinine about 4 in June 2016   Appreciate VVS   She has had 3 treatments so far and ws awaiting CLIP  2. Eval for GN: awaiting studies Ratio Kappa/lambda normal and complements normal  She did have a small M spike and it may be prudent to have Oncology see her at some point  3. Renal US small echodense  4. Daily weights, Daily Renal Panel, Strict I/Os, Avoid nephrotoxins (NSAIDs, judicious IV Contrast) 5. PTH 1017 start Calcitriol 0.89mcg MWF with dialysis 6. Anemia  Start ESA and check iron studies      LOS: 7 Killian Ress W @TODAY @9 :54 AM

## 2017-06-22 DIAGNOSIS — R531 Weakness: Secondary | ICD-10-CM

## 2017-06-22 LAB — RENAL FUNCTION PANEL
Albumin: 2.3 g/dL — ABNORMAL LOW (ref 3.5–5.0)
Anion gap: 11 (ref 5–15)
BUN: 67 mg/dL — AB (ref 6–20)
CHLORIDE: 100 mmol/L — AB (ref 101–111)
CO2: 21 mmol/L — AB (ref 22–32)
CREATININE: 6.01 mg/dL — AB (ref 0.44–1.00)
Calcium: 8.3 mg/dL — ABNORMAL LOW (ref 8.9–10.3)
GFR calc non Af Amer: 6 mL/min — ABNORMAL LOW (ref >60)
GFR, EST AFRICAN AMERICAN: 7 mL/min — AB (ref >60)
GLUCOSE: 139 mg/dL — AB (ref 65–99)
Phosphorus: 4.7 mg/dL — ABNORMAL HIGH (ref 2.5–4.6)
Potassium: 4.1 mmol/L (ref 3.5–5.1)
Sodium: 132 mmol/L — ABNORMAL LOW (ref 135–145)

## 2017-06-22 LAB — URINE CULTURE

## 2017-06-22 MED ORDER — CALCITRIOL 0.5 MCG PO CAPS
ORAL_CAPSULE | ORAL | Status: AC
  Administered 2017-06-22: 0.5 ug via ORAL
  Filled 2017-06-22: qty 1

## 2017-06-22 MED ORDER — DARBEPOETIN ALFA 60 MCG/0.3ML IJ SOSY
PREFILLED_SYRINGE | INTRAMUSCULAR | Status: AC
  Filled 2017-06-22: qty 0.3

## 2017-06-22 MED ORDER — DARBEPOETIN ALFA 100 MCG/0.5ML IJ SOSY
PREFILLED_SYRINGE | INTRAMUSCULAR | Status: AC
  Administered 2017-06-22: 60 ug via INTRAVENOUS
  Filled 2017-06-22: qty 0.5
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
## 2017-06-22 NOTE — Progress Notes (Signed)
  [facility] Progress Note   Assessment/ Plan:   1. 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  Continue secondary prevention 2.ESRD: MWF schedule, HD #3 today.  Likely due to chronic progressive nephrosclerosis. 3. Anemia: Hgb 8.3 06/19/2017, Tsat 14%, getting ESA and IV iron.    Please note she has a + Hep B core antibody but a negative Hep B surface antigen and a reactive Hep B s antibody.  She does not need to be in Hep B iso at this point but will need to be monitored for seroconversion which although rare can occur.   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 Nutrition:getting nepro 6. Hypertension: on hydralazine, amlodipine 7.  Dispo: plan for SNF  Subjective:    Seen on HD.  Pt feeling sweaty, UF turned off, BP dropped and pt was placed in Trendelenberg.   Objective:   BP 128/71   Pulse 86   Temp 98.6 F (37 C) (Oral)   Resp 18   Ht 5\' 2"  (1.575 m)   Wt 54.8 kg (120 lb 13 oz)   SpO2 96%   BMI 22.10 kg/m   Physical Exam: Gen: older woman, diaphoretic CVS: RRR no m/r/g Resp: clear bilaterally no c/w/r MIW:OEHO nontender NABS Ext: no LE edema  Labs: BMET Recent Labs  Lab 06/16/17 0459 06/17/17 0625 06/18/17 0806 06/19/17 0447 06/20/17 0601 06/21/17 0726 06/22/17 0623  NA 141  141 135 136 139 136 134* 132*  K 4.0  3.9 4.0 4.8 4.4 4.1 4.3 4.1  CL 113*  112* 108 110 108 102 101 100*  CO2 18*  15* 15* 17* 23 25 22  21*  GLUCOSE 91  89 120* 87 89 89 130* 139*  BUN 70*  72* 67* 68* 55* 35* 51* 67*  CREATININE 8.68*  8.50* 8.40* 8.54* 6.51* 4.98* 5.68* 6.01*  CALCIUM 7.2*  7.1* 7.4* 7.8* 7.9* 8.3* 8.3* 8.3*  PHOS 5.7* 5.1* 6.5* 6.0* 4.9* 5.1* 4.7*   CBC Recent Labs  Lab 06/16/17 0459 06/19/17 1710  WBC 6.3 10.0  HGB 8.9* 8.3*  HCT 27.4* 25.5*  MCV 87.5 88.2  PLT 277 248    @IMGRELPRIORS @ Medications:    .  stroke: mapping our early stages of recovery book   Does not apply Once  . amLODipine  10 mg  Oral Daily  . aspirin EC  81 mg Oral Daily  . atorvastatin  40 mg Oral q1800  . calcitRIOL      . calcitRIOL  0.5 mcg Oral Q M,W,F-HD  . calcium carbonate  200 mg of elemental calcium Oral TID WC  . cloNIDine  0.3 mg Transdermal Q Tue  . Darbepoetin Alfa      . darbepoetin (ARANESP) injection - DIALYSIS  60 mcg Intravenous Q Mon-HD  . enoxaparin (LOVENOX) injection  30 mg Subcutaneous Daily  . feeding supplement (NEPRO CARB STEADY)  237 mL Oral BID BM  . hydrALAZINE  25 mg Oral Q8H  . kidney failure book   Does not apply Once  . metroNIDAZOLE  500 mg Oral Q12H  . nicotine  7 mg Transdermal Daily  . pantoprazole  40 mg Oral Daily     Madelon Lips MD [facility] pgr [phone] 06/22/2017, 12:00 PM

## 2017-06-22 NOTE — Procedures (Signed)
Patient seen and examined on Hemodialysis. QB 400 mL/ min, UF goal 3.5L  Pt BP dropped, I turned UF off.    Treatment adjusted as needed.  Madelon Lips MD [facility] pgr [phone] 12:14 PM

## 2017-06-22 NOTE — Clinical Social Work Note (Addendum)
CSW continuing efforts to find placement for patient, in and outside of [facility] and no bed offers to date. CSW has also consulted with CSW Microbiologist to discuss patient and will following up on the recommendations to contact local SNF's directly regarding patient and if no offers, contact [place] and [place].  Lashaundra Lehrmann Givens, MSW, LCSW Licensed Clinical Social Worker [place] [phone]

## 2017-06-23 ENCOUNTER — Encounter (HOSPITAL_COMMUNITY): Payer: Self-pay | Admitting: Family Medicine

## 2017-06-23 DIAGNOSIS — Z992 Dependence on renal dialysis: Secondary | ICD-10-CM

## 2017-06-23 DIAGNOSIS — N186 End stage renal disease: Secondary | ICD-10-CM

## 2017-06-23 DIAGNOSIS — I10 Essential (primary) hypertension: Secondary | ICD-10-CM

## 2017-06-23 LAB — RENAL FUNCTION PANEL
ALBUMIN: 2.3 g/dL — AB (ref 3.5–5.0)
ANION GAP: 11 (ref 5–15)
Albumin: 2.3 g/dL — ABNORMAL LOW (ref 3.5–5.0)
Anion gap: 12 (ref 5–15)
BUN: 43 mg/dL — ABNORMAL HIGH (ref 6–20)
BUN: 62 mg/dL — ABNORMAL HIGH (ref 6–20)
CALCIUM: 8.5 mg/dL — AB (ref 8.9–10.3)
CALCIUM: 7.9 mg/dL — AB (ref 8.9–10.3)
CHLORIDE: 98 mmol/L — AB (ref 101–111)
CO2: 24 mmol/L (ref 22–32)
CO2: 22 mmol/L (ref 22–32)
CREATININE: 5.77 mg/dL — AB (ref 0.44–1.00)
Chloride: 100 mmol/L — ABNORMAL LOW (ref 101–111)
Creatinine, Ser: 4.64 mg/dL — ABNORMAL HIGH (ref 0.44–1.00)
GFR calc Af Amer: 10 mL/min — ABNORMAL LOW (ref >60)
GFR calc Af Amer: 8 mL/min — ABNORMAL LOW (ref >60)
GFR, EST NON AFRICAN AMERICAN: 9 mL/min — AB (ref >60)
GFR, EST NON AFRICAN AMERICAN: 7 mL/min — AB (ref >60)
GLUCOSE: 92 mg/dL (ref 65–99)
Glucose, Bld: 165 mg/dL — ABNORMAL HIGH (ref 65–99)
PHOSPHORUS: 5.1 mg/dL — AB (ref 2.5–4.6)
POTASSIUM: 4.3 mmol/L (ref 3.5–5.1)
Phosphorus: 4.8 mg/dL — ABNORMAL HIGH (ref 2.5–4.6)
Potassium: 4.1 mmol/L (ref 3.5–5.1)
SODIUM: 135 mmol/L (ref 135–145)
SODIUM: 132 mmol/L — AB (ref 135–145)

## 2017-06-23 LAB — CBC
HCT: 21.6 % — ABNORMAL LOW (ref 36.0–46.0)
Hemoglobin: 7 g/dL — ABNORMAL LOW (ref 12.0–15.0)
MCH: 28.6 pg (ref 26.0–34.0)
MCHC: 32.4 g/dL (ref 30.0–36.0)
MCV: 88.2 fL (ref 78.0–100.0)
PLATELETS: 315 10*3/uL (ref 150–400)
RBC: 2.45 MIL/uL — ABNORMAL LOW (ref 3.87–5.11)
RDW: 15.2 % (ref 11.5–15.5)
WBC: 11.5 10*3/uL — AB (ref 4.0–10.5)

## 2017-06-23 MED ORDER — LIDOCAINE HCL (PF) 1 % IJ SOLN: 5.0000 mL | INTRAMUSCULAR | Status: DC | PRN

## 2017-06-23 MED ORDER — SODIUM CHLORIDE 0.9 % IV SOLN: 100.0000 mL | INTRAVENOUS | Status: DC | PRN

## 2017-06-23 MED ORDER — NICOTINE 14 MG/24HR TD PT24
14.0000 mg | MEDICATED_PATCH | Freq: Every day | TRANSDERMAL | Status: DC
  Administered 2017-06-24 – 2017-07-02 (×9): 14 mg via TRANSDERMAL
  Filled 2017-06-23 (×9): qty 1

## 2017-06-23 MED ORDER — HEPARIN SODIUM (PORCINE) 1000 UNIT/ML DIALYSIS: 1000.0000 [IU] | INTRAMUSCULAR | Status: DC | PRN

## 2017-06-23 MED ORDER — ALTEPLASE 2 MG IJ SOLR: 2.0000 mg | Freq: Once | INTRAMUSCULAR | Status: DC | PRN

## 2017-06-23 MED ORDER — LIDOCAINE-PRILOCAINE 2.5-2.5 % EX CREA: 1.0000 "application " | TOPICAL_CREAM | CUTANEOUS | Status: DC | PRN

## 2017-06-23 MED ORDER — PENTAFLUOROPROP-TETRAFLUOROETH EX AERO: 1.0000 "application " | INHALATION_SPRAY | CUTANEOUS | Status: DC | PRN
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
## 2017-06-23 NOTE — Progress Notes (Signed)
Nutrition Follow-up  DOCUMENTATION CODES:   Non-severe (moderate) malnutrition in context of chronic illness  INTERVENTION:   Continue Nepro Shake po BID, each supplement provides 425 kcal and 19 grams protein.  NUTRITION DIAGNOSIS:   Moderate Malnutrition related to chronic illness(CKD V, HTN, polysubstance abuse) as evidenced by moderate fat depletion, mild muscle depletion.  Ongoing  GOAL:   Patient will meet greater than or equal to 90% of their needs  Progressing  MONITOR:   PO intake, Supplement acceptance, Labs, Weight trends  ASSESSMENT:   67 yo admitted with subacute CVA with polysubstance abuse and HTN. Pt with CKD V. Dialysis cath placed 1/18 and on MWF HD schedule. Pt with hx of hepatitis C, gastric ulcer  Spoke with pt at bedside who reports she has been eating "alright." Pt completed 75% of breakfast this AM which included pancakes, coffee, and peaches. Meal completion has been between 0-75%. Per pt, she has been occasionally consuming Nepro oral nutrition supplements. Per chart, she has refused. Unopened Nepro shake by pt's bedside at time of visit. Encouraged pt to consume Nepro shakes and added instructions to serve cold.  Medications reviewed and include: Lipitor 40 mg, calcium carbonate, and Protonix. Labs reviewed: BUN 43 (H), creatinine 4.64 (H), calcium 8.5 (L), phosphorus 5.1 (H)  Diet Order:  Diet renal with fluid restriction Fluid restriction: 1200 mL Fluid; Room service appropriate? Yes; Fluid consistency: Thin  EDUCATION NEEDS:   Not appropriate for education at this time  Skin:  Skin Assessment: Reviewed RN Assessment  Last BM:  06/22/17  Height:   Ht Readings from Last 1 Encounters:  06/14/17 5\' 2"  (1.575 m)    Weight:   Wt Readings from Last 1 Encounters:  06/22/17 113 lb 4.8 oz (51.4 kg)    Ideal Body Weight:  50 kg  BMI:  Body mass index is 20.72 kg/m.  Estimated Nutritional Needs:   Kcal:  1500-1750 kcals  Protein:   75-88 g  Fluid:  1.3 L    Gaynell Face, MS, RD, LDN Pager: [phone] Weekend/After Hours: [phone]

## 2017-06-23 NOTE — Progress Notes (Signed)
  [facility] Progress Note   Assessment/ Plan:   1. 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  Continue secondary prevention 2.ESRD: MWF schedule, HD #3 1/14.  Likely due to chronic progressive nephrosclerosis.  CLIP in process 3. Anemia: Hgb 8.3 06/19/2017, Tsat 14%, getting ESA and IV iron.    Please note she has a + Hep B core antibody but a negative Hep B surface antigen and a reactive Hep B s antibody.  She does not need to be in Hep B iso at this point but will need to be monitored for seroconversion which although rare can occur.   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 Nutrition:getting nepro 6. Hypertension: on hydralazine, amlodipine 7.  Dispo: plan for SNF  Subjective:    No complaints today.   Objective:   BP 136/69   Pulse 85   Temp 98.4 F (36.9 C) (Oral)   Resp 18   Ht 5\' 2"  (1.575 m)   Wt 51.4 kg (113 lb 4.8 oz)   SpO2 100%   BMI 20.72 kg/m   Physical Exam: Gen: older woman, sitting in chair.   CVS: RRR no m/r/g Resp: clear bilaterally no c/w/r WSF:KCLE nontender NABS Ext: no LE edema ACCESS: R IJ TDC in place, LUE AVF + T/B  Labs: BMET Recent Labs  Lab 06/17/17 0625 06/18/17 0806 06/19/17 0447 06/20/17 0601 06/21/17 0726 06/22/17 0623 06/23/17 0637  NA 135 136 139 136 134* 132* 135  K 4.0 4.8 4.4 4.1 4.3 4.1 4.3  CL 108 110 108 102 101 100* 100*  CO2 15* 17* 23 25 22  21* 24  GLUCOSE 120* 87 89 89 130* 139* 92  BUN 67* 68* 55* 35* 51* 67* 43*  CREATININE 8.40* 8.54* 6.51* 4.98* 5.68* 6.01* 4.64*  CALCIUM 7.4* 7.8* 7.9* 8.3* 8.3* 8.3* 8.5*  PHOS 5.1* 6.5* 6.0* 4.9* 5.1* 4.7* 5.1*   CBC Recent Labs  Lab 06/19/17 1710  WBC 10.0  HGB 8.3*  HCT 25.5*  MCV 88.2  PLT 248    @IMGRELPRIORS @ Medications:    .  stroke: mapping our early stages of recovery book   Does not apply Once  . amLODipine  10 mg Oral Daily  . aspirin EC  81 mg Oral Daily  . atorvastatin  40 mg Oral q1800  . calcitRIOL   0.5 mcg Oral Q M,W,F-HD  . calcium carbonate  200 mg of elemental calcium Oral TID WC  . cloNIDine  0.3 mg Transdermal Q Tue  . darbepoetin (ARANESP) injection - DIALYSIS  60 mcg Intravenous Q Mon-HD  . enoxaparin (LOVENOX) injection  30 mg Subcutaneous Daily  . feeding supplement (NEPRO CARB STEADY)  237 mL Oral BID BM  . hydrALAZINE  25 mg Oral Q8H  . kidney failure book   Does not apply Once  . [START ON 06/24/2017] nicotine  14 mg Transdermal Daily  . pantoprazole  40 mg Oral Daily     Madelon Lips MD [facility] pgr [phone] 06/23/2017, 1:45 PM

## 2017-06-23 NOTE — Progress Notes (Signed)
Accepted at [facility] .1st treatment Thursday,January 17.2019 @ 10:45am .Schedule and chairtime Tuesday,Thursday,Saturday 12:10pm 2nd shift

## 2017-06-23 NOTE — Progress Notes (Signed)
Patient states that her current nicotine patch is not working for her. MD notified. New orders placed. Will continue to monitor.

## 2017-06-23 NOTE — Clinical Social Work Note (Addendum)
CSW continuing search for a skilled nursing facility for Ms. Hartzell. Calls made today to admissions directors at [facility], [facility] and [facility] facilities and they are unable to accept patient. Contact made with Freda Munro, admissions director with [facility] at 1:44 pm and she indicated that patient's clinicals would be reviewed and she would get back with CSW. Another call made to [facility] at 3:46 pm and message left. Per Dr. Jacquiline Doe direction, calls will be made to [facility] and [facility].  Call made to [facility] and spoke with admissions director Suanne Marker regarding patient. She requested that clinicals be sent to them. CSW also informed that their patient's use [facility] in [place], as patient's HD center will have to be changed.  Shakyla Nolley Givens, MSW, LCSW Licensed Clinical Social Worker [place] [phone]
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
## 2017-06-24 LAB — RENAL FUNCTION PANEL
Albumin: 2.4 g/dL — ABNORMAL LOW (ref 3.5–5.0)
Anion gap: 11 (ref 5–15)
BUN: 40 mg/dL — ABNORMAL HIGH (ref 6–20)
CALCIUM: 8.2 mg/dL — AB (ref 8.9–10.3)
CO2: 24 mmol/L (ref 22–32)
CREATININE: 3.87 mg/dL — AB (ref 0.44–1.00)
Chloride: 100 mmol/L — ABNORMAL LOW (ref 101–111)
GFR calc Af Amer: 13 mL/min — ABNORMAL LOW (ref >60)
GFR, EST NON AFRICAN AMERICAN: 11 mL/min — AB (ref >60)
Glucose, Bld: 122 mg/dL — ABNORMAL HIGH (ref 65–99)
Phosphorus: 3 mg/dL (ref 2.5–4.6)
Potassium: 3.2 mmol/L — ABNORMAL LOW (ref 3.5–5.1)
SODIUM: 135 mmol/L (ref 135–145)

## 2017-06-24 LAB — CBC
HCT: 22.3 % — ABNORMAL LOW (ref 36.0–46.0)
Hemoglobin: 7.2 g/dL — ABNORMAL LOW (ref 12.0–15.0)
MCH: 28.9 pg (ref 26.0–34.0)
MCHC: 32.3 g/dL (ref 30.0–36.0)
MCV: 89.6 fL (ref 78.0–100.0)
Platelets: 329 10*3/uL (ref 150–400)
RBC: 2.49 MIL/uL — ABNORMAL LOW (ref 3.87–5.11)
RDW: 15.6 % — ABNORMAL HIGH (ref 11.5–15.5)
WBC: 8.8 10*3/uL (ref 4.0–10.5)

## 2017-06-24 LAB — PREPARE RBC (CROSSMATCH)

## 2017-06-24 MED ORDER — SODIUM CHLORIDE 0.9 % IV SOLN: Freq: Once | INTRAVENOUS | Status: DC

## 2017-06-24 MED ORDER — SODIUM CHLORIDE 0.9 % IV SOLN
125.0000 mg | INTRAVENOUS | Status: DC
  Administered 2017-06-24 – 2017-06-26 (×2): 125 mg via INTRAVENOUS
  Filled 2017-06-24 (×5): qty 10

## 2017-06-24 NOTE — Plan of Care (Signed)
Pt a and o x4; right side weakness noted but pt mobilizing.  Tolerating diet.  RA.  Tolerates HD.  Awaiting SNF placement.

## 2017-06-24 NOTE — Progress Notes (Signed)
  [facility] Progress Note   Assessment/ Plan:   1. 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  Continue secondary prevention 2.ESRD: Shorter HD treatment today and then TTS schedule as an outpatient.  CLIP'd to [facility] TTS 2nd shift.   3. Anemia: Hgb 8.3 06/19/2017, Tsat 14%, getting ESA and IV iron.  Hgb 7.0 today without signs of bleeding, will tranfuse 1 u pRBCs today in HD.    Please note she has a + Hep B core antibody but a negative Hep B surface antigen and a reactive Hep B s antibody.  She does not need to be in Hep B iso at this point but will need to be monitored for seroconversion which although rare can occur.   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 Nutrition:getting nepro 6. Hypertension: on hydralazine, amlodipine, clonidine patch 7.  Dispo: d/c today  Subjective:    Seen this AM on dialysis.  For d/c today. Short treatment today as she will be TTS.    Objective:   BP (!) 144/84 (BP Location: Right Arm)   Pulse 85   Temp 98.3 F (36.8 C) (Oral)   Resp 18   Ht 5\' 2"  (1.575 m)   Wt 52 kg (114 lb 10.2 oz) Comment: stood to scale  SpO2 100%   BMI 20.97 kg/m   Physical Exam: Gen: older woman, lying in bed, NAD CVS: RRR no m/r/g Resp: clear bilaterally no c/w/r RZN:BVAP nontender NABS Ext: no LE edema ACCESS: R IJ TDC in place, LUE AVF + T/B  Labs: BMET Recent Labs  Lab 06/19/17 0447 06/20/17 0601 06/21/17 0726 06/22/17 0623 06/23/17 0637 06/23/17 2228 06/24/17 0814  NA 139 136 134* 132* 135 132* 135  K 4.4 4.1 4.3 4.1 4.3 4.1 3.2*  CL 108 102 101 100* 100* 98* 100*  CO2 23 25 22  21* 24 22 24   GLUCOSE 89 89 130* 139* 92 165* 122*  BUN 55* 35* 51* 67* 43* 62* 40*  CREATININE 6.51* 4.98* 5.68* 6.01* 4.64* 5.77* 3.87*  CALCIUM 7.9* 8.3* 8.3* 8.3* 8.5* 7.9* 8.2*  PHOS 6.0* 4.9* 5.1* 4.7* 5.1* 4.8* 3.0   CBC Recent Labs  Lab 06/19/17 1710 06/23/17 2228 06/24/17 0833  WBC 10.0 11.5* 8.8  HGB 8.3* 7.0* 7.2*   HCT 25.5* 21.6* 22.3*  MCV 88.2 88.2 89.6  PLT 248 315 329    @IMGRELPRIORS @ Medications:    . amLODipine  10 mg Oral Daily  . aspirin EC  81 mg Oral Daily  . atorvastatin  40 mg Oral q1800  . calcitRIOL  0.5 mcg Oral Q M,W,F-HD  . calcium carbonate  200 mg of elemental calcium Oral TID WC  . cloNIDine  0.3 mg Transdermal Q Tue  . darbepoetin (ARANESP) injection - DIALYSIS  60 mcg Intravenous Q Mon-HD  . enoxaparin (LOVENOX) injection  30 mg Subcutaneous Daily  . feeding supplement (NEPRO CARB STEADY)  237 mL Oral BID BM  . hydrALAZINE  25 mg Oral Q8H  . kidney failure book   Does not apply Once  . nicotine  14 mg Transdermal Daily  . pantoprazole  40 mg Oral Daily     Madelon Lips MD [facility] pgr [phone] 06/24/2017, 1:10 PM

## 2017-06-24 NOTE — Clinical Social Work Note (Addendum)
Clinicals transmitted to [facility] admissions director Suanne Marker this morning and confirmed by phone call that information received. [facility]acement request form and necessary clinicals faxed to [facility] for dialysis placement in [place] while at the skilled facility. Once CSW set-up at a dialysis center in [place], patient will be ready for discharge. CSW will continue following and providing SW intervention services through discharge.  Gerron Guidotti Givens, MSW, LCSW Licensed Clinical Social Worker [place] [phone]

## 2017-06-24 NOTE — Procedures (Signed)
Patient seen and examined on Hemodialysis. QB 400 mL/ min, UF goal 1L.  Getting 1 u pRBCs.  Treatment adjusted as needed.  Madelon Lips MD [facility] pgr [phone] 1:13 PM
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
## 2017-06-25 LAB — UIFE/LIGHT CHAINS/TP QN, 24-HR UR
% BETA, Urine: 20.2 %
ALBUMIN, U: 40.2 %
ALPHA 1 URINE: 10.4 %
ALPHA 2 UR: 14.8 %
FREE KAPPA LT CHAINS, UR: 479 mg/L — AB (ref 1.35–24.19)
FREE KAPPA/LAMBDA RATIO: 3.8 (ref 2.04–10.37)
Free Lambda Lt Chains,Ur: 126 mg/L — ABNORMAL HIGH (ref 0.24–6.66)
GAMMA GLOBULIN URINE: 14.4 %
TOTAL PROTEIN, URINE-UPE24: 302.8 mg/dL
Total Protein, Urine-Ur/day: 1817 mg/24 hr — ABNORMAL HIGH (ref 30–150)
Total Volume: 600

## 2017-06-25 LAB — RENAL FUNCTION PANEL
Albumin: 2.4 g/dL — ABNORMAL LOW (ref 3.5–5.0)
Anion gap: 9 (ref 5–15)
BUN: 35 mg/dL — ABNORMAL HIGH (ref 6–20)
CO2: 26 mmol/L (ref 22–32)
CREATININE: 3.81 mg/dL — AB (ref 0.44–1.00)
Calcium: 8.5 mg/dL — ABNORMAL LOW (ref 8.9–10.3)
Chloride: 100 mmol/L — ABNORMAL LOW (ref 101–111)
GFR, EST AFRICAN AMERICAN: 13 mL/min — AB (ref >60)
GFR, EST NON AFRICAN AMERICAN: 11 mL/min — AB (ref >60)
Glucose, Bld: 88 mg/dL (ref 65–99)
POTASSIUM: 4.5 mmol/L (ref 3.5–5.1)
Phosphorus: 4.2 mg/dL (ref 2.5–4.6)
Sodium: 135 mmol/L (ref 135–145)

## 2017-06-25 LAB — BPAM RBC
Blood Product Expiration Date: 201902032359
ISSUE DATE / TIME: 201901161102
Unit Type and Rh: 600

## 2017-06-25 LAB — TYPE AND SCREEN
ABO/RH(D): A NEG
Antibody Screen: NEGATIVE
Unit division: 0
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
## 2017-06-25 NOTE — Progress Notes (Signed)
  [facility] Progress Note   Assessment/ Plan:   1. 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  Continue secondary prevention 2.ESRD: Next HD planned for tomorrow 1/18.  CLIP'd to [facility] TTS 2nd shift but looks like she'll be going to the [place] area for discharge. 3. Anemia: Hgb 8.3 06/19/2017, Tsat 14%, getting ESA and IV iron.  Hgb 7.0 today without signs of bleeding, will tranfuse 1 u pRBCs today in HD.    Please note she has a + Hep B core antibody but a negative Hep B surface antigen and a reactive Hep B s antibody.  She does not need to be in Hep B iso at this point but will need to be monitored for seroconversion which although rare can occur.   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 Nutrition:getting nepro 6. Hypertension: on hydralazine, amlodipine, clonidine patch 7.  M-spike: IFE in process- consider f/u as OP with oncology 8.  Dispo: d/c today  Subjective:    D/c didn't happen yesterday, going to [place] area SNF, appears from CM notes that she'll need to go to a [place] [facility].   Objective:   BP (!) 150/72 (BP Location: Right Arm)   Pulse 79   Temp 98.4 F (36.9 C) (Oral)   Resp 18   Ht 5\' 2"  (1.575 m)   Wt 52 kg (114 lb 10.2 oz)   SpO2 100%   BMI 20.97 kg/m   Physical Exam: Gen: older woman, lying in bed, NAD CVS: RRR no m/r/g Resp: clear bilaterally no c/w/r DPO:EUMP nontender NABS Ext: no LE edema ACCESS: R IJ TDC in place, LUE AVF + T/B  Labs: BMET Recent Labs  Lab 06/20/17 0601 06/21/17 0726 06/22/17 0623 06/23/17 0637 06/23/17 2228 06/24/17 0814 06/25/17 0716  NA 136 134* 132* 135 132* 135 135  K 4.1 4.3 4.1 4.3 4.1 3.2* 4.5  CL 102 101 100* 100* 98* 100* 100*  CO2 25 22 21* 24 22 24 26   GLUCOSE 89 130* 139* 92 165* 122* 88  BUN 35* 51* 67* 43* 62* 40* 35*  CREATININE 4.98* 5.68* 6.01* 4.64* 5.77* 3.87* 3.81*  CALCIUM 8.3* 8.3* 8.3* 8.5* 7.9* 8.2* 8.5*  PHOS 4.9* 5.1* 4.7* 5.1* 4.8* 3.0 4.2    CBC Recent Labs  Lab 06/19/17 1710 06/23/17 2228 06/24/17 0833  WBC 10.0 11.5* 8.8  HGB 8.3* 7.0* 7.2*  HCT 25.5* 21.6* 22.3*  MCV 88.2 88.2 89.6  PLT 248 315 329    @IMGRELPRIORS @ Medications:    . amLODipine  10 mg Oral Daily  . aspirin EC  81 mg Oral Daily  . atorvastatin  40 mg Oral q1800  . calcitRIOL  0.5 mcg Oral Q M,W,F-HD  . calcium carbonate  200 mg of elemental calcium Oral TID WC  . cloNIDine  0.3 mg Transdermal Q Tue  . darbepoetin (ARANESP) injection - DIALYSIS  60 mcg Intravenous Q Mon-HD  . enoxaparin (LOVENOX) injection  30 mg Subcutaneous Daily  . feeding supplement (NEPRO CARB STEADY)  237 mL Oral BID BM  . hydrALAZINE  25 mg Oral Q8H  . kidney failure book   Does not apply Once  . nicotine  14 mg Transdermal Daily  . pantoprazole  40 mg Oral Daily     Madelon Lips MD [facility] pgr [phone] 06/25/2017, 1:03 PM

## 2017-06-25 NOTE — Progress Notes (Signed)
OT Cancellation Note  Patient Details Name: Ashley [name] MRN: [id number] DOB: 07-09-1950   Cancelled Treatment:    Reason Eval/Treat Not Completed: Patient declined, no reason specified. Despite max A pt declining participation this session. Will attempt tomorrow.   [place] 06/25/2017, 5:19 PM  Hulda Humphrey OTR/L [phone]

## 2017-06-25 NOTE — Progress Notes (Signed)
PT Cancellation Note  Patient Details Name: Ashley [name] MRN: [id number] DOB: July 26, 1950   Cancelled Treatment:    Reason Eval/Treat Not Completed: Patient declined, no reason specified. Pt refusing to participate in any mobility at this time, reporting that she just recently walked to and from the bathroom with staff. PT will continue to f/u with pt as available.    [place] 06/25/2017, 2:28 PM

## 2017-06-26 LAB — RENAL FUNCTION PANEL
Albumin: 2.3 g/dL — ABNORMAL LOW (ref 3.5–5.0)
Anion gap: 13 (ref 5–15)
BUN: 60 mg/dL — ABNORMAL HIGH (ref 6–20)
CHLORIDE: 100 mmol/L — AB (ref 101–111)
CO2: 22 mmol/L (ref 22–32)
Calcium: 8.4 mg/dL — ABNORMAL LOW (ref 8.9–10.3)
Creatinine, Ser: 5.43 mg/dL — ABNORMAL HIGH (ref 0.44–1.00)
GFR, EST AFRICAN AMERICAN: 8 mL/min — AB (ref >60)
GFR, EST NON AFRICAN AMERICAN: 7 mL/min — AB (ref >60)
Glucose, Bld: 131 mg/dL — ABNORMAL HIGH (ref 65–99)
POTASSIUM: 4 mmol/L (ref 3.5–5.1)
Phosphorus: 4.2 mg/dL (ref 2.5–4.6)
Sodium: 135 mmol/L (ref 135–145)

## 2017-06-26 LAB — CBC
HEMATOCRIT: 24.4 % — AB (ref 36.0–46.0)
Hemoglobin: 8.3 g/dL — ABNORMAL LOW (ref 12.0–15.0)
MCH: 31.3 pg (ref 26.0–34.0)
MCHC: 34 g/dL (ref 30.0–36.0)
MCV: 92.1 fL (ref 78.0–100.0)
PLATELETS: 384 10*3/uL (ref 150–400)
RBC: 2.65 MIL/uL — AB (ref 3.87–5.11)
RDW: 15.5 % (ref 11.5–15.5)
WBC: 10.9 10*3/uL — AB (ref 4.0–10.5)

## 2017-06-26 NOTE — Progress Notes (Addendum)
  [facility] Progress Note   Assessment/ Plan:   1. 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  Continue secondary prevention 2.ESRD: for HD today- unclear what schedule she will be on as outpt, may need a [facility] unit, will do MWF here.  Using R femoral TDC, 1st stage BVT performed 06/16/17 by Dr, Bridgett Larsson. 3. Anemia: Hgb 8.3 06/19/2017, Tsat 14%, getting ESA and IV iron.  S/p 1 u prbcs 1/16.    Please note she has a + Hep B core antibody but a negative Hep B surface antigen and a reactive Hep B s antibody.  She does not need to be in Hep B iso at this point but will need to be monitored for seroconversion which although rare can occur.   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 Nutrition:getting nepro 6. Hypertension: on hydralazine, amlodipine, clonidine patch 7.  Dispo: d/c today hopefully  Subjective:    For HD today as well.  She is awaiting SNF placement.     Objective:   BP 135/67 (BP Location: Right Arm)   Pulse 70   Temp 98 F (36.7 C) (Oral)   Resp 18   Ht 5\' 2"  (1.575 m)   Wt 52 kg (114 lb 10.2 oz)   SpO2 99%   BMI 20.97 kg/m   Physical Exam: Gen: older woman, lying in bed, NAD CVS: RRR no m/r/g Resp: clear bilaterally no c/w/r BJY:NWGN nontender NABS Ext: no LE edema ACCESS: R femoral TDC in place, LUE AVF + T/B (1st stage BVT)  Labs: BMET Recent Labs  Lab 06/21/17 0726 06/22/17 5621 06/23/17 3086 06/23/17 2228 06/24/17 0814 06/25/17 0716 06/26/17 0921  NA 134* 132* 135 132* 135 135 135  K 4.3 4.1 4.3 4.1 3.2* 4.5 4.0  CL 101 100* 100* 98* 100* 100* 100*  CO2 22 21* 24 22 24 26 22   GLUCOSE 130* 139* 92 165* 122* 88 131*  BUN 51* 67* 43* 62* 40* 35* 60*  CREATININE 5.68* 6.01* 4.64* 5.77* 3.87* 3.81* 5.43*  CALCIUM 8.3* 8.3* 8.5* 7.9* 8.2* 8.5* 8.4*  PHOS 5.1* 4.7* 5.1* 4.8* 3.0 4.2 4.2   CBC Recent Labs  Lab 06/19/17 1710 06/23/17 2228 06/24/17 0833 06/26/17 0921  WBC 10.0 11.5* 8.8 10.9*  HGB 8.3* 7.0*  7.2* 8.3*  HCT 25.5* 21.6* 22.3* 24.4*  MCV 88.2 88.2 89.6 92.1  PLT 248 315 329 384    @IMGRELPRIORS @ Medications:    . amLODipine  10 mg Oral Daily  . aspirin EC  81 mg Oral Daily  . atorvastatin  40 mg Oral q1800  . calcitRIOL  0.5 mcg Oral Q M,W,F-HD  . calcium carbonate  200 mg of elemental calcium Oral TID WC  . cloNIDine  0.3 mg Transdermal Q Tue  . darbepoetin (ARANESP) injection - DIALYSIS  60 mcg Intravenous Q Mon-HD  . enoxaparin (LOVENOX) injection  30 mg Subcutaneous Daily  . feeding supplement (NEPRO CARB STEADY)  237 mL Oral BID BM  . hydrALAZINE  25 mg Oral Q8H  . kidney failure book   Does not apply Once  . nicotine  14 mg Transdermal Daily  . pantoprazole  40 mg Oral Daily     Madelon Lips MD [facility] pgr [phone] 06/26/2017, 12:21 PM

## 2017-06-26 NOTE — Progress Notes (Signed)
Smoking cessation information given to patient and reviewed. Patient stated that she would review the information in detail and had no further questions. Will continue to provide education.
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
## 2017-06-26 NOTE — Progress Notes (Signed)
PT Cancellation Note  Patient Details Name: Ashley [name] MRN: [id number] DOB: [birth year]   Cancelled Treatment:    Reason Eval/Treat Not Completed: Patient at procedure or test/unavailable. Pt going to HD. PT will continue to f/u with pt as available.    [place] 06/26/2017, 12:52 PM

## 2017-06-26 NOTE — Clinical Social Work Note (Signed)
CSW continuing efforts to find placement for patient. Followed up with Suanne Marker at [facility] skilled facility and was informed that they have declined patient. Call made to [facility] and talked with admissions director Tamika at 3:57 pm. She requested that clinicals be sent through [facility] and she will review on Monday as she had left work for the day. CSW will f/u with admissions director on Monday.  Kano Heckmann Givens, MSW, LCSW Licensed Clinical Social Worker [place] [phone]
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
## 2017-06-26 NOTE — Progress Notes (Signed)
Patient tolerated hemodialysis treatment well.  Post wt 51.7 kg.  Educated regarding cath care post hospitalization.  Patient verbalized understanding.

## 2017-06-27 LAB — RENAL FUNCTION PANEL
ALBUMIN: 2.4 g/dL — AB (ref 3.5–5.0)
ANION GAP: 12 (ref 5–15)
BUN: 28 mg/dL — ABNORMAL HIGH (ref 6–20)
CO2: 24 mmol/L (ref 22–32)
Calcium: 8.5 mg/dL — ABNORMAL LOW (ref 8.9–10.3)
Chloride: 96 mmol/L — ABNORMAL LOW (ref 101–111)
Creatinine, Ser: 3.31 mg/dL — ABNORMAL HIGH (ref 0.44–1.00)
GFR, EST AFRICAN AMERICAN: 15 mL/min — AB (ref >60)
GFR, EST NON AFRICAN AMERICAN: 13 mL/min — AB (ref >60)
Glucose, Bld: 88 mg/dL (ref 65–99)
PHOSPHORUS: 4.4 mg/dL (ref 2.5–4.6)
Potassium: 4.2 mmol/L (ref 3.5–5.1)
Sodium: 132 mmol/L — ABNORMAL LOW (ref 135–145)

## 2017-06-27 LAB — CBC
HEMATOCRIT: 26.2 % — AB (ref 36.0–46.0)
HEMOGLOBIN: 8.3 g/dL — AB (ref 12.0–15.0)
MCH: 29.1 pg (ref 26.0–34.0)
MCHC: 31.7 g/dL (ref 30.0–36.0)
MCV: 91.9 fL (ref 78.0–100.0)
Platelets: 401 10*3/uL — ABNORMAL HIGH (ref 150–400)
RBC: 2.85 MIL/uL — ABNORMAL LOW (ref 3.87–5.11)
RDW: 15.6 % — ABNORMAL HIGH (ref 11.5–15.5)
WBC: 10.4 10*3/uL (ref 4.0–10.5)

## 2017-06-27 MED ORDER — OXYCODONE HCL 5 MG PO TABS
5.0000 mg | ORAL_TABLET | Freq: Once | ORAL | Status: AC
  Administered 2017-06-27: 5 mg via ORAL
  Filled 2017-06-27: qty 1

## 2017-06-27 NOTE — Progress Notes (Signed)
Patient is complaining of pain unrelieved by current PRN medications. MD notified and aware. Will continue to monitor.
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
## 2017-06-27 NOTE — Progress Notes (Signed)
  [facility] Progress Note   Assessment/ Plan:   1. 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  Continue secondary prevention 2.ESRD: for HD today- unclear what schedule she will be on as outpt, may need a davita unit, will do MWF here.  Using R femoral TDC, 1st stage BVT performed 06/16/17 by Dr, Bridgett Larsson. 3. Anemia: Hgb 8.3 06/19/2017, Tsat 14%, getting ESA and IV iron.  S/p 1 u prbcs 1/16.    Please note she has a + Hep B core antibody but a negative Hep B surface antigen and a reactive Hep B s antibody.  She does not need to be in Hep B iso at this point but will need to be monitored for seroconversion which although rare can occur.   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 Nutrition:getting nepro 6. Hypertension: on hydralazine, amlodipine, clonidine patch 7.  Dispo: needs a SNF  Subjective:    Awaiting SNF.     Objective:   BP 129/68 (BP Location: Right Arm)   Pulse 71   Temp 98.3 F (36.8 C) (Oral)   Resp 18   Ht 5\' 2"  (1.575 m)   Wt 51.6 kg (113 lb 12.1 oz)   SpO2 99%   BMI 20.81 kg/m   Physical Exam: Gen: older woman, lying in bed, NAD CVS: RRR no m/r/g Resp: clear bilaterally no c/w/r WLS:LHTD nontender NABS Ext: no LE edema ACCESS: R femoral TDC in place, LUE AVF + T/B (1st stage BVT)  Labs: BMET Recent Labs  Lab 06/22/17 0623 06/23/17 4287 06/23/17 2228 06/24/17 0814 06/25/17 0716 06/26/17 0921 06/27/17 0514  NA 132* 135 132* 135 135 135 132*  K 4.1 4.3 4.1 3.2* 4.5 4.0 4.2  CL 100* 100* 98* 100* 100* 100* 96*  CO2 21* 24 22 24 26 22 24   GLUCOSE 139* 92 165* 122* 88 131* 88  BUN 67* 43* 62* 40* 35* 60* 28*  CREATININE 6.01* 4.64* 5.77* 3.87* 3.81* 5.43* 3.31*  CALCIUM 8.3* 8.5* 7.9* 8.2* 8.5* 8.4* 8.5*  PHOS 4.7* 5.1* 4.8* 3.0 4.2 4.2 4.4   CBC Recent Labs  Lab 06/23/17 2228 06/24/17 0833 06/26/17 0921 06/27/17 0514  WBC 11.5* 8.8 10.9* 10.4  HGB 7.0* 7.2* 8.3* 8.3*  HCT 21.6* 22.3* 24.4* 26.2*  MCV  88.2 89.6 92.1 91.9  PLT 315 329 384 401*    @IMGRELPRIORS @ Medications:    . amLODipine  10 mg Oral Daily  . aspirin EC  81 mg Oral Daily  . atorvastatin  40 mg Oral q1800  . calcitRIOL  0.5 mcg Oral Q M,W,F-HD  . calcium carbonate  200 mg of elemental calcium Oral TID WC  . cloNIDine  0.3 mg Transdermal Q Tue  . darbepoetin (ARANESP) injection - DIALYSIS  60 mcg Intravenous Q Mon-HD  . enoxaparin (LOVENOX) injection  30 mg Subcutaneous Daily  . feeding supplement (NEPRO CARB STEADY)  237 mL Oral BID BM  . hydrALAZINE  25 mg Oral Q8H  . kidney failure book   Does not apply Once  . nicotine  14 mg Transdermal Daily  . pantoprazole  40 mg Oral Daily     Madelon Lips MD [facility] pgr [phone] 06/27/2017, 11:21 AM

## 2017-06-28 LAB — RENAL FUNCTION PANEL
ALBUMIN: 2.4 g/dL — AB (ref 3.5–5.0)
Anion gap: 12 (ref 5–15)
BUN: 49 mg/dL — AB (ref 6–20)
CO2: 24 mmol/L (ref 22–32)
Calcium: 8.5 mg/dL — ABNORMAL LOW (ref 8.9–10.3)
Chloride: 97 mmol/L — ABNORMAL LOW (ref 101–111)
Creatinine, Ser: 4.88 mg/dL — ABNORMAL HIGH (ref 0.44–1.00)
GFR calc Af Amer: 10 mL/min — ABNORMAL LOW (ref >60)
GFR, EST NON AFRICAN AMERICAN: 8 mL/min — AB (ref >60)
GLUCOSE: 84 mg/dL (ref 65–99)
PHOSPHORUS: 5.4 mg/dL — AB (ref 2.5–4.6)
POTASSIUM: 4 mmol/L (ref 3.5–5.1)
Sodium: 133 mmol/L — ABNORMAL LOW (ref 135–145)

## 2017-06-28 LAB — OCCULT BLOOD X 1 CARD TO LAB, STOOL: FECAL OCCULT BLD: POSITIVE — AB

## 2017-06-28 MED ORDER — POLYETHYLENE GLYCOL 3350 17 G PO PACK
17.0000 g | PACK | Freq: Every day | ORAL | Status: DC | PRN
  Administered 2017-06-28: 17 g via ORAL
  Filled 2017-06-28: qty 1

## 2017-06-28 MED ORDER — DARBEPOETIN ALFA 150 MCG/0.3ML IJ SOSY
150.0000 ug | PREFILLED_SYRINGE | INTRAMUSCULAR | Status: DC
  Filled 2017-06-28: qty 0.3

## 2017-06-28 NOTE — Progress Notes (Signed)
Patients fecal occult was positive. MD notified and made aware. Will continue to monitor.
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
## 2017-06-28 NOTE — Progress Notes (Signed)
  [facility] Progress Note   Assessment/ Plan:    23F p/w subacute CVA, h/o drug abuse, Hep C, new start HD, awaiting placement.   1. 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  Continue secondary prevention  2.ESRD: for HD today- unclear what schedule she will be on as outpt, may need a davita unit, will do MWF here.  Using R femoral TDC, 1st stage BVT performed 06/16/17 by Dr, Bridgett Larsson.  Has a + Hep B core antibody but a negative Hep B surface antigen and a reactive Hep B s antibody.  3. Anemia:  Tsat 14%, getting Aranesp (increased to 150 mcg q Monday) and IV iron.  S/p 1 u prbcs 1/16.      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 Nutrition:getting nepro  6. Hypertension: on hydralazine, amlodipine, clonidine patch  7.  Dispo: needs a SNF- dispo has been difficult d/t h/o drug use.  Subjective:    Awaiting SNF. No changes today.      Objective:   BP 123/62 (BP Location: Right Arm)   Pulse 69   Temp 98.4 F (36.9 C) (Oral)   Resp 16   Ht 5\' 2"  (1.575 m)   Wt 51.8 kg (114 lb 3.2 oz)   SpO2 100%   BMI 20.89 kg/m   Physical Exam: Gen: older woman, lying in bed, NAD CVS: RRR no m/r/g Resp: clear bilaterally no c/w/r LGX:QJJH nontender NABS Ext: no LE edema ACCESS: R femoral TDC in place, LUE AVF + T/B (1st stage BVT)  Labs: BMET Recent Labs  Lab 06/23/17 0637 06/23/17 2228 06/24/17 0814 06/25/17 0716 06/26/17 0921 06/27/17 0514 06/28/17 0740  NA 135 132* 135 135 135 132* 133*  K 4.3 4.1 3.2* 4.5 4.0 4.2 4.0  CL 100* 98* 100* 100* 100* 96* 97*  CO2 24 22 24 26 22 24 24   GLUCOSE 92 165* 122* 88 131* 88 84  BUN 43* 62* 40* 35* 60* 28* 49*  CREATININE 4.64* 5.77* 3.87* 3.81* 5.43* 3.31* 4.88*  CALCIUM 8.5* 7.9* 8.2* 8.5* 8.4* 8.5* 8.5*  PHOS 5.1* 4.8* 3.0 4.2 4.2 4.4 5.4*   CBC Recent Labs  Lab 06/23/17 2228 06/24/17 0833 06/26/17 0921 06/27/17 0514  WBC 11.5* 8.8 10.9* 10.4  HGB 7.0* 7.2* 8.3* 8.3*  HCT 21.6*  22.3* 24.4* 26.2*  MCV 88.2 89.6 92.1 91.9  PLT 315 329 384 401*    @IMGRELPRIORS @ Medications:    . amLODipine  10 mg Oral Daily  . aspirin EC  81 mg Oral Daily  . atorvastatin  40 mg Oral q1800  . calcitRIOL  0.5 mcg Oral Q M,W,F-HD  . calcium carbonate  200 mg of elemental calcium Oral TID WC  . cloNIDine  0.3 mg Transdermal Q Tue  . darbepoetin (ARANESP) injection - DIALYSIS  60 mcg Intravenous Q Mon-HD  . enoxaparin (LOVENOX) injection  30 mg Subcutaneous Daily  . feeding supplement (NEPRO CARB STEADY)  237 mL Oral BID BM  . hydrALAZINE  25 mg Oral Q8H  . kidney failure book   Does not apply Once  . nicotine  14 mg Transdermal Daily  . pantoprazole  40 mg Oral Daily     Madelon Lips MD [facility] pgr [phone] 06/28/2017, 10:42 AM

## 2017-06-29 DIAGNOSIS — N189 Chronic kidney disease, unspecified: Secondary | ICD-10-CM

## 2017-06-29 MED ORDER — CALCITRIOL 0.5 MCG PO CAPS: 0.5000 ug | ORAL_CAPSULE | Freq: Once | ORAL | Status: DC

## 2017-06-29 MED ORDER — DARBEPOETIN ALFA 150 MCG/0.3ML IJ SOSY: 150.0000 ug | PREFILLED_SYRINGE | INTRAMUSCULAR | Status: DC

## 2017-06-29 MED ORDER — CALCIUM ACETATE (PHOS BINDER) 667 MG PO CAPS
667.0000 mg | ORAL_CAPSULE | Freq: Three times a day (TID) | ORAL | Status: DC
  Administered 2017-06-29 – 2017-07-02 (×8): 667 mg via ORAL
  Filled 2017-06-29 (×8): qty 1

## 2017-06-29 MED ORDER — CLONIDINE HCL 0.2 MG/24HR TD PTWK
0.2000 mg | MEDICATED_PATCH | TRANSDERMAL | Status: DC
  Administered 2017-06-29: 0.2 mg via TRANSDERMAL
  Filled 2017-06-29: qty 1

## 2017-06-29 MED ORDER — SODIUM CHLORIDE 0.9 % IV SOLN
125.0000 mg | Freq: Once | INTRAVENOUS | Status: AC
  Administered 2017-06-30: 125 mg via INTRAVENOUS
  Filled 2017-06-29 (×2): qty 10
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
## 2017-06-29 NOTE — Clinical Social Work Note (Addendum)
Ashley [name] has declined at this time. Still pt has no bed offers. CSW to consult with CSW Surveyor, quantity.   5:00- AD suggest CSW reach out to [place]. Dentist will be in the building tomorrow morning and will look at pt on the hub. CSW has not heard back from [place].   [place], [name]

## 2017-06-29 NOTE — Progress Notes (Signed)
[facility] Progress Note   Assessment/ Plan:    67F p/w subacute CVA, h/o drug abuse, Hep C, new start HD, awaiting placement.   1. 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  Continue secondary prevention  2.ESRD: new this admission- for HD today- unclear what schedule she will be on as outpt, may need a davita unit, will do MWF here.  If gets accepted here has spot TTS at [place]- second shift. Using R femoral TDC, 1st stage BVT performed 06/16/17 by Dr, Bridgett Larsson.  Has a + Hep B core antibody but a negative Hep B surface antigen and a reactive Hep B s antibody.  3. Anemia:  Tsat 14%, getting Aranesp (increased to 150 mcg q Monday) and IV iron.  S/p 1 u prbcs 1/16.      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 Nutrition:getting nepro  6. Hypertension: on hydralazine, amlodipine, clonidine patch- will decrease dose of patch as BP seems well controlled   7.  Dispo: needs a SNF- dispo has been difficult d/t h/o drug use. Is ready to go from renal standpoint as long as has OP HD unit  Subjective:    Awaiting SNF. No changes today.      Objective:   BP (!) 109/51 (BP Location: Right Arm)   Pulse 69   Temp 98.1 F (36.7 C) (Oral)   Resp 18   Ht 5\' 2"  (1.575 m)   Wt 51.8 kg (114 lb 3.2 oz)   SpO2 100%   BMI 20.89 kg/m   Physical Exam: Gen: older woman, lying in bed, NAD CVS: RRR no m/r/g Resp: clear bilaterally no c/w/r JHE:RDEY nontender NABS Ext: no LE edema ACCESS: R femoral TDC in place, LUE AVF + T/B (1st stage BVT)  Labs: BMET Recent Labs  Lab 06/23/17 0637 06/23/17 2228 06/24/17 0814 06/25/17 0716 06/26/17 0921 06/27/17 0514 06/28/17 0740  NA 135 132* 135 135 135 132* 133*  K 4.3 4.1 3.2* 4.5 4.0 4.2 4.0  CL 100* 98* 100* 100* 100* 96* 97*  CO2 24 22 24 26 22 24 24   GLUCOSE 92 165* 122* 88 131* 88 84  BUN 43* 62* 40* 35* 60* 28* 49*  CREATININE 4.64* 5.77* 3.87* 3.81* 5.43* 3.31* 4.88*  CALCIUM  8.5* 7.9* 8.2* 8.5* 8.4* 8.5* 8.5*  PHOS 5.1* 4.8* 3.0 4.2 4.2 4.4 5.4*   CBC Recent Labs  Lab 06/23/17 2228 06/24/17 0833 06/26/17 0921 06/27/17 0514  WBC 11.5* 8.8 10.9* 10.4  HGB 7.0* 7.2* 8.3* 8.3*  HCT 21.6* 22.3* 24.4* 26.2*  MCV 88.2 89.6 92.1 91.9  PLT 315 329 384 401*    @IMGRELPRIORS @ Medications:    . amLODipine  10 mg Oral Daily  . aspirin EC  81 mg Oral Daily  . atorvastatin  40 mg Oral q1800  . calcitRIOL  0.5 mcg Oral Q M,W,F-HD  . calcium carbonate  200 mg of elemental calcium Oral TID WC  . cloNIDine  0.3 mg Transdermal Q Tue  . darbepoetin (ARANESP) injection - DIALYSIS  150 mcg Intravenous Q Mon-HD  . enoxaparin (LOVENOX) injection  30 mg Subcutaneous Daily  . feeding supplement (NEPRO CARB STEADY)  237 mL Oral BID BM  . hydrALAZINE  25 mg Oral Q8H  . kidney failure book   Does not apply Once  . nicotine  14 mg Transdermal Daily  . pantoprazole  40 mg Oral Daily  Ayslin Kundert A  06/29/2017, 12:06 PM
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
## 2017-06-30 LAB — RENAL FUNCTION PANEL
ALBUMIN: 2.3 g/dL — AB (ref 3.5–5.0)
ANION GAP: 12 (ref 5–15)
BUN: 82 mg/dL — ABNORMAL HIGH (ref 6–20)
CALCIUM: 8.1 mg/dL — AB (ref 8.9–10.3)
CO2: 21 mmol/L — ABNORMAL LOW (ref 22–32)
Chloride: 101 mmol/L (ref 101–111)
Creatinine, Ser: 6.33 mg/dL — ABNORMAL HIGH (ref 0.44–1.00)
GFR calc non Af Amer: 6 mL/min — ABNORMAL LOW (ref >60)
GFR, EST AFRICAN AMERICAN: 7 mL/min — AB (ref >60)
GLUCOSE: 87 mg/dL (ref 65–99)
PHOSPHORUS: 6.5 mg/dL — AB (ref 2.5–4.6)
POTASSIUM: 6.7 mmol/L — AB (ref 3.5–5.1)
Sodium: 134 mmol/L — ABNORMAL LOW (ref 135–145)

## 2017-06-30 LAB — CBC
HEMATOCRIT: 21.2 % — AB (ref 36.0–46.0)
HEMOGLOBIN: 7.1 g/dL — AB (ref 12.0–15.0)
MCH: 31.6 pg (ref 26.0–34.0)
MCHC: 33.5 g/dL (ref 30.0–36.0)
MCV: 94.2 fL (ref 78.0–100.0)
Platelets: 408 10*3/uL — ABNORMAL HIGH (ref 150–400)
RBC: 2.25 MIL/uL — AB (ref 3.87–5.11)
RDW: 15.7 % — ABNORMAL HIGH (ref 11.5–15.5)
WBC: 8.3 10*3/uL (ref 4.0–10.5)

## 2017-06-30 MED ORDER — DARBEPOETIN ALFA 150 MCG/0.3ML IJ SOSY
PREFILLED_SYRINGE | INTRAMUSCULAR | Status: AC
  Filled 2017-06-30: qty 0.3

## 2017-06-30 MED ORDER — CALCITRIOL 0.5 MCG PO CAPS
ORAL_CAPSULE | ORAL | Status: AC
  Filled 2017-06-30: qty 1

## 2017-06-30 MED ORDER — DARBEPOETIN ALFA 150 MCG/0.3ML IJ SOSY
150.0000 ug | PREFILLED_SYRINGE | INTRAMUSCULAR | Status: DC
  Administered 2017-06-30: 150 ug via INTRAVENOUS

## 2017-06-30 MED ORDER — CALCITRIOL 0.5 MCG PO CAPS
0.5000 ug | ORAL_CAPSULE | ORAL | Status: DC
  Administered 2017-06-30 – 2017-07-02 (×2): 0.5 ug via ORAL

## 2017-06-30 NOTE — Procedures (Signed)
Patient was seen on dialysis and the procedure was supervised.  BFR 350  Via PC BP is  117/60.   Patient appears to be tolerating treatment well  Alyscia Carmon A 06/30/2017
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
## 2017-06-30 NOTE — Care Management Note (Signed)
Case Management Note  Patient Details  Name: Ashley [name] MRN: [id number] Date of Birth: 10-28-1950  Subjective/Objective:  Pt presented for weakness. Hx of Drug Abuse.  Renal Failure on HD. CLIP completed for [facility] TTS 2nd shift. CSW trying to assist with SNF placement for disposition.                   Action/Plan: Discussed in [facility] recommendations for CSW to contact [facility] to see if pt will be able to get a SNF bed at this location. CM will continue to monitor.   Expected Discharge Date:                  Expected Discharge Plan:  [place]  In-House Referral:  Clinical Social Work  Discharge planning Services  CM Consult  Post Acute Care Choice:  NA Choice offered to:  NA  DME Arranged:  N/A DME Agency:  NA  HH Arranged:  NA HH Agency:  NA  Status of Service:  Completed, signed off  If discussed at [place] of Stay Meetings, dates discussed:  06-30-17 Additional Comments:  Bethena Roys, RN 06/30/2017, 9:52 AM

## 2017-06-30 NOTE — Clinical Social Work Note (Addendum)
CSW received call from Maudie Mercury, Mudlogger of nursing at [place] (10:05 am) requesting clinicals be resent on patient and she will review and get back with SW.  1:33 pm - CSW followed up with Kara Pacer at [facility]  [phone] - mobile) regarding Ms. Bachar patient and was informed that they cannot accept patient.   1:45 pm - Call made to [facility] with Althea Charon and [place] following up regarding Ms. Frith and they are unable to accept patient.  1:59 pm - Call made to Kit, admissions director at [facility] regarding patient. Per Kit, at present they do not have any female beds.  5:07 pm - Call made to medical director, Dr. Reynaldo Minium and update given. CSW advised to contact Edwyna Ready, [place] Surveyor, quantity. Call made to [facility] and he will follow-up with Freda Munro at [facility] regarding patient as Dr. Reynaldo Minium had made contact with them regarding Ms. Novakowski. CSW will wait to hear from [place] Surveyor, quantity regarding [employer].   Taquilla Downum Givens, MSW, LCSW Licensed Clinical Social Worker [place] [phone]

## 2017-06-30 NOTE — Progress Notes (Signed)
Nutrition Follow-up  DOCUMENTATION CODES:   Non-severe (moderate) malnutrition in context of chronic illness  INTERVENTION:   -Continue Nepro Shake po BID, each supplement provides 425 kcal and 19 grams protein    NUTRITION DIAGNOSIS:   Moderate Malnutrition related to chronic illness(CKD V, HTN, polysubstance abuse) as evidenced by moderate fat depletion, mild muscle depletion.  Being addressed as po intake improving, taking supplements  GOAL:   Patient will meet greater than or equal to 90% of their needs  Progressing  MONITOR:   PO intake, Supplement acceptance, Labs, Weight trends  REASON FOR ASSESSMENT:   Malnutrition Screening Tool    ASSESSMENT:   67 yo admitted with subacute CVA with polysubstance abuse and HTN. Pt with CKD V. Dialysis cath placed 1/18 and on MWF HD schedule. Pt with hx of hepatitis C, gastric ulcer  Recent po intake 50-100% of all meals,  taking some Nepro Hyperkalemic today, pt without HD x 4 days, plan for HD today Awaiting SNF placement  Weight fluctuating since admission but relatively stable since admission. Admission wt 109 pounds, current wt 113 pounds, Noted pt with good UOP, may not require strict fluid restriction at this time  Labs:sodium 134, potassium 6.7 (H), phosphorus 6.5 (H) Meds: calcitriol, calcium acetate, ferric gluconate,   Diet Order:  Fall precautions Diet renal with fluid restriction Fluid restriction: 1200 mL Fluid; Room service appropriate? Yes; Fluid consistency: Thin  EDUCATION NEEDS:   Not appropriate for education at this time  Skin:  Skin Assessment: Reviewed RN Assessment  Last BM:  06/22/17  Height:   Ht Readings from Last 1 Encounters:  06/14/17 5\' 2"  (1.575 m)    Weight:   Wt Readings from Last 1 Encounters:  06/29/17 113 lb 8.6 oz (51.5 kg)    Ideal Body Weight:  50 kg  BMI:  Body mass index is 20.77 kg/m.  Estimated Nutritional Needs:   Kcal:  1500-1750 kcals  Protein:  75-88  g  Fluid:  1.3 L  Kerman Passey MS, RD, LDN, CNSC [phone] Pager  [phone] Weekend/On-Call Pager
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
## 2017-06-30 NOTE — Progress Notes (Signed)
[facility] Progress Note   Assessment/ Plan:    39F p/w subacute CVA, h/o drug abuse, Hep C, new start HD, awaiting placement.   1. 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  Continue secondary prevention  2.ESRD: new this admission- for HD today- unclear what schedule she will be on as outpt, may need a davita unit, last done Friday, K was 6.7 today, I think just because went 4 days without.  If gets accepted to SNF here has spot TTS at [place]- second shift. Using R femoral TDC, 1st stage BVT performed 06/16/17 by Dr, Bridgett Larsson.  Has a + Hep B core antibody but a negative Hep B surface antigen and a reactive Hep B s antibody. HD today , then will plan next for Thursday- keep on TTS for now  3. Anemia:  Tsat 14%, getting Aranesp (increased to 150 mcg q Monday) and IV iron.  S/p 1 u prbcs 1/16.      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 Nutrition:getting nepro  6. Hypertension: on hydralazine, amlodipine, clonidine patch- will decrease dose of patch as BP seems well controlled - as I dec clonidine patch yest- no change today- if stays good- will dec to #1 patch and try to get off of that   7.  Dispo: needs a SNF- dispo has been difficult d/t h/o drug use. Is ready to go from renal standpoint as long as has OP HD unit  Subjective:    Awaiting SNF. No changes today.      Objective:   BP 128/71   Pulse 72   Temp 98.4 F (36.9 C) (Oral)   Resp 20   Ht 5\' 2"  (1.575 m)   Wt 51.5 kg (113 lb 8.6 oz)   SpO2 100%   BMI 20.77 kg/m   Physical Exam: Gen: older woman, lying in bed, NAD CVS: RRR no m/r/g Resp: clear bilaterally no c/w/r JQB:HALP nontender NABS Ext: no LE edema ACCESS: R femoral TDC in place, LUE AVF + T/B (1st stage BVT)  Labs: BMET Recent Labs  Lab 06/23/17 2228 06/24/17 0814 06/25/17 0716 06/26/17 0921 06/27/17 0514 06/28/17 0740 06/30/17 0721  NA 132* 135 135 135 132* 133* 134*  K 4.1  3.2* 4.5 4.0 4.2 4.0 6.7*  CL 98* 100* 100* 100* 96* 97* 101  CO2 22 24 26 22 24 24  21*  GLUCOSE 165* 122* 88 131* 88 84 87  BUN 62* 40* 35* 60* 28* 49* 82*  CREATININE 5.77* 3.87* 3.81* 5.43* 3.31* 4.88* 6.33*  CALCIUM 7.9* 8.2* 8.5* 8.4* 8.5* 8.5* 8.1*  PHOS 4.8* 3.0 4.2 4.2 4.4 5.4* 6.5*   CBC Recent Labs  Lab 06/23/17 2228 06/24/17 0833 06/26/17 0921 06/27/17 0514  WBC 11.5* 8.8 10.9* 10.4  HGB 7.0* 7.2* 8.3* 8.3*  HCT 21.6* 22.3* 24.4* 26.2*  MCV 88.2 89.6 92.1 91.9  PLT 315 329 384 401*    @IMGRELPRIORS @ Medications:    . amLODipine  10 mg Oral Daily  . aspirin EC  81 mg Oral Daily  . atorvastatin  40 mg Oral q1800  . calcitRIOL  0.5 mcg Oral Q M,W,F-HD  . calcitRIOL  0.5 mcg Oral Once  . calcium acetate  667 mg Oral TID WC  . cloNIDine  0.2 mg Transdermal Weekly  . [START ON 07/01/2017] darbepoetin (ARANESP) injection - DIALYSIS  150 mcg Intravenous Q Wed-HD  . enoxaparin (LOVENOX) injection  30 mg Subcutaneous Daily  . feeding supplement (NEPRO CARB STEADY)  237 mL Oral BID BM  . hydrALAZINE  25 mg Oral Q8H  . kidney failure book   Does not apply Once  . nicotine  14 mg Transdermal Daily  . pantoprazole  40 mg Oral Daily     Izsak Meir A  06/30/2017, 10:09 AM

## 2017-06-30 NOTE — Progress Notes (Signed)
OT Cancellation Note    06/30/17 1100  OT Visit Information  Last OT Received On 06/30/17  Reason Eval/Treat Not Completed Other (comment) (Pt in HD. Will attmept later if able. )  [facility], OT/L  [phone] 06/30/2017

## 2017-07-01 LAB — CBC WITH DIFFERENTIAL/PLATELET
Basophils Absolute: 0 10*3/uL (ref 0.0–0.1)
Basophils Relative: 1 %
EOS PCT: 4 %
Eosinophils Absolute: 0.3 10*3/uL (ref 0.0–0.7)
HCT: 23.2 % — ABNORMAL LOW (ref 36.0–46.0)
Hemoglobin: 7.6 g/dL — ABNORMAL LOW (ref 12.0–15.0)
LYMPHS ABS: 1.8 10*3/uL (ref 0.7–4.0)
LYMPHS PCT: 22 %
MCH: 30.6 pg (ref 26.0–34.0)
MCHC: 32.8 g/dL (ref 30.0–36.0)
MCV: 93.5 fL (ref 78.0–100.0)
MONO ABS: 1.5 10*3/uL — AB (ref 0.1–1.0)
Monocytes Relative: 18 %
Neutro Abs: 4.5 10*3/uL (ref 1.7–7.7)
Neutrophils Relative %: 55 %
PLATELETS: 375 10*3/uL (ref 150–400)
RBC: 2.48 MIL/uL — AB (ref 3.87–5.11)
RDW: 16.4 % — AB (ref 11.5–15.5)
WBC: 8.2 10*3/uL (ref 4.0–10.5)

## 2017-07-01 LAB — RENAL FUNCTION PANEL
Albumin: 2.4 g/dL — ABNORMAL LOW (ref 3.5–5.0)
Anion gap: 12 (ref 5–15)
BUN: 38 mg/dL — AB (ref 6–20)
CHLORIDE: 100 mmol/L — AB (ref 101–111)
CO2: 24 mmol/L (ref 22–32)
Calcium: 8.6 mg/dL — ABNORMAL LOW (ref 8.9–10.3)
Creatinine, Ser: 3.91 mg/dL — ABNORMAL HIGH (ref 0.44–1.00)
GFR calc Af Amer: 13 mL/min — ABNORMAL LOW (ref >60)
GFR calc non Af Amer: 11 mL/min — ABNORMAL LOW (ref >60)
GLUCOSE: 90 mg/dL (ref 65–99)
POTASSIUM: 4.1 mmol/L (ref 3.5–5.1)
Phosphorus: 4.5 mg/dL (ref 2.5–4.6)
Sodium: 136 mmol/L (ref 135–145)

## 2017-07-01 MED ORDER — CLONIDINE HCL 0.1 MG/24HR TD PTWK
0.1000 mg | MEDICATED_PATCH | TRANSDERMAL | Status: DC
  Filled 2017-07-01: qty 1

## 2017-07-01 MED ORDER — HEPARIN SODIUM (PORCINE) 5000 UNIT/ML IJ SOLN
5000.0000 [IU] | Freq: Three times a day (TID) | INTRAMUSCULAR | Status: DC
  Administered 2017-07-01 – 2017-07-02 (×5): 5000 [IU] via SUBCUTANEOUS
  Filled 2017-07-01 (×5): qty 1

## 2017-07-01 MED ORDER — SODIUM CHLORIDE 0.9 % IV SOLN
125.0000 mg | INTRAVENOUS | Status: DC
  Administered 2017-07-02: 125 mg via INTRAVENOUS
  Filled 2017-07-01 (×2): qty 10
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
## 2017-07-01 NOTE — Care Management Important Message (Signed)
Important Message  Patient Details  Name: Ashley [name] MRN: [id number] Date of Birth: April 12, 1951   Medicare Important Message Given:  Yes    Orbie Pyo 07/01/2017, 12:36 PM

## 2017-07-01 NOTE — Care Management Note (Signed)
Case Management Note Per Previous Note: Bethena Roys, RN 06/30/2017, 9:52 AM Patient Details  Name: Ashley [name] MRN: [id number] Date of Birth: 02/03/1951  Subjective/Objective:  Pt presented for weakness. Hx of Drug Abuse.  Renal Failure on HD. CLIP completed for [facility] TTS 2nd shift. CSW trying to assist with SNF placement for disposition.             Action/Plan: Discussed in [facility] recommendations for CSW to contact [facility] to see if pt will be able to get a SNF bed at this location. CM will continue to monitor.   Expected Discharge Date:                  Expected Discharge Plan:  [place]  In-House Referral:  Clinical Social Work  Discharge planning Services  CM Consult, Medication Assistance  Post Acute Care Choice:  NA Choice offered to:  NA  DME Arranged:  N/A DME Agency:  NA  HH Arranged:  NA HH Agency:  NA  Status of Service:  Completed, signed off  If discussed at [employer] of Stay Meetings, dates discussed:  06-30-17 ---- Additional Comments:  Insurance noted, Patient will not qualify for medication assistance at this time.  Kristen Cardinal, RN  Nurse case manager [phone] 07/01/2017, 3:19 PM

## 2017-07-01 NOTE — Progress Notes (Signed)
  [facility] Progress Note   Assessment/ Plan:    33F p/w subacute CVA, h/o drug abuse, Hep C, new start HD, awaiting placement. - Possibly to [facility]   1. 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  Continue secondary prevention  2.ESRD: new this admission-- unclear what schedule she will be on as outpt as dispo is unclear, - has spot TTS at [place]- second shift. Using R femoral TDC, 1st stage BVT performed 06/16/17 by Dr, Bridgett Larsson.  Has a + Hep B core antibody but a negative Hep B surface antigen and a reactive Hep B s antibody. Next HD on Thursday- keep on TTS for now  3. Anemia:  Tsat 14%, getting Aranesp (increased to 150 mcg q Monday) and IV iron.  S/p 1 u prbcs 1/16.      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 Nutrition:getting nepro  6. Hypertension: on hydralazine, amlodipine, clonidine patch- - will stop patch - then work on other meds if BP stays low   7.  Dispo: needs a SNF- dispo has been difficult d/t h/o drug use. Is ready to go from renal standpoint    Subjective:    Awaiting SNF. HD yest- removed 1500- tolerated well - says is tired maybe due to BP    Objective:   BP (!) 115/53 (BP Location: Right Arm)   Pulse 71   Temp 98.7 F (37.1 C) (Oral)   Resp 16   Ht 5\' 2"  (1.575 m)   Wt 51 kg (112 lb 7 oz)   SpO2 100%   BMI 20.56 kg/m   Physical Exam: Gen: older woman, lying in bed, NAD CVS: RRR no m/r/g Resp: clear bilaterally no c/w/r ZOX:[id number] nontender NABS Ext: no LE edema ACCESS: R femoral TDC in place, LUE AVF + T/B (1st stage BVT)  Labs: BMET Recent Labs  Lab 06/25/17 0716 06/26/17 0921 06/27/17 0514 06/28/17 0740 06/30/17 0721 07/01/17 0523  NA 135 135 132* 133* 134* 136  K 4.5 4.0 4.2 4.0 6.7* 4.1  CL 100* 100* 96* 97* 101 100*  CO2 26 22 24 24  21* 24  GLUCOSE 88 131* 88 84 87 90  BUN 35* 60* 28* 49* 82* 38*  CREATININE 3.81* 5.43* 3.31* 4.88* 6.33* 3.91*  CALCIUM  8.5* 8.4* 8.5* 8.5* 8.1* 8.6*  PHOS 4.2 4.2 4.4 5.4* 6.5* 4.5   CBC Recent Labs  Lab 06/26/17 0921 06/27/17 0514 06/30/17 0721  WBC 10.9* 10.4 8.3  HGB 8.3* 8.3* 7.1*  HCT 24.4* 26.2* 21.2*  MCV 92.1 91.9 94.2  PLT 384 401* 408*    @IMGRELPRIORS @ Medications:    . amLODipine  10 mg Oral Daily  . aspirin EC  81 mg Oral Daily  . atorvastatin  40 mg Oral q1800  . calcitRIOL  0.5 mcg Oral Q T,Th,Sa-HD  . calcium acetate  667 mg Oral TID WC  . cloNIDine  0.2 mg Transdermal Weekly  . darbepoetin (ARANESP) injection - DIALYSIS  150 mcg Intravenous Q Tue-HD  . enoxaparin (LOVENOX) injection  30 mg Subcutaneous Daily  . feeding supplement (NEPRO CARB STEADY)  237 mL Oral BID BM  . hydrALAZINE  25 mg Oral Q8H  . kidney failure book   Does not apply Once  . nicotine  14 mg Transdermal Daily  . pantoprazole  40 mg Oral Daily     Lynann Demetrius A  07/01/2017, 10:15 AM

## 2017-07-02 DIAGNOSIS — N2581 Secondary hyperparathyroidism of renal origin: Secondary | ICD-10-CM | POA: Diagnosis not present

## 2017-07-02 DIAGNOSIS — H2513 Age-related nuclear cataract, bilateral: Secondary | ICD-10-CM | POA: Diagnosis not present

## 2017-07-02 DIAGNOSIS — E872 Acidosis: Secondary | ICD-10-CM | POA: Diagnosis not present

## 2017-07-02 DIAGNOSIS — G464 Cerebellar stroke syndrome: Secondary | ICD-10-CM | POA: Diagnosis not present

## 2017-07-02 DIAGNOSIS — N185 Chronic kidney disease, stage 5: Secondary | ICD-10-CM | POA: Diagnosis not present

## 2017-07-02 DIAGNOSIS — I639 Cerebral infarction, unspecified: Secondary | ICD-10-CM | POA: Diagnosis not present

## 2017-07-02 DIAGNOSIS — Z23 Encounter for immunization: Secondary | ICD-10-CM | POA: Diagnosis not present

## 2017-07-02 DIAGNOSIS — D631 Anemia in chronic kidney disease: Secondary | ICD-10-CM | POA: Diagnosis not present

## 2017-07-02 DIAGNOSIS — D509 Iron deficiency anemia, unspecified: Secondary | ICD-10-CM | POA: Diagnosis not present

## 2017-07-02 DIAGNOSIS — T8249XD Other complication of vascular dialysis catheter, subsequent encounter: Secondary | ICD-10-CM | POA: Diagnosis not present

## 2017-07-02 DIAGNOSIS — D689 Coagulation defect, unspecified: Secondary | ICD-10-CM | POA: Diagnosis not present

## 2017-07-02 DIAGNOSIS — N179 Acute kidney failure, unspecified: Secondary | ICD-10-CM | POA: Diagnosis not present

## 2017-07-02 DIAGNOSIS — S59919D Unspecified injury of unspecified forearm, subsequent encounter: Secondary | ICD-10-CM | POA: Diagnosis not present

## 2017-07-02 DIAGNOSIS — R278 Other lack of coordination: Secondary | ICD-10-CM | POA: Diagnosis not present

## 2017-07-02 DIAGNOSIS — G458 Other transient cerebral ischemic attacks and related syndromes: Secondary | ICD-10-CM | POA: Diagnosis not present

## 2017-07-02 DIAGNOSIS — R262 Difficulty in walking, not elsewhere classified: Secondary | ICD-10-CM | POA: Diagnosis not present

## 2017-07-02 DIAGNOSIS — N186 End stage renal disease: Secondary | ICD-10-CM | POA: Diagnosis not present

## 2017-07-02 DIAGNOSIS — Z992 Dependence on renal dialysis: Secondary | ICD-10-CM | POA: Diagnosis not present

## 2017-07-02 DIAGNOSIS — M6281 Muscle weakness (generalized): Secondary | ICD-10-CM | POA: Diagnosis not present

## 2017-07-02 DIAGNOSIS — I10 Essential (primary) hypertension: Secondary | ICD-10-CM | POA: Diagnosis not present

## 2017-07-02 LAB — RENAL FUNCTION PANEL
ANION GAP: 12 (ref 5–15)
Albumin: 2.3 g/dL — ABNORMAL LOW (ref 3.5–5.0)
BUN: 63 mg/dL — ABNORMAL HIGH (ref 6–20)
CHLORIDE: 98 mmol/L — AB (ref 101–111)
CO2: 22 mmol/L (ref 22–32)
CREATININE: 5.24 mg/dL — AB (ref 0.44–1.00)
Calcium: 8.2 mg/dL — ABNORMAL LOW (ref 8.9–10.3)
GFR calc Af Amer: 9 mL/min — ABNORMAL LOW (ref >60)
GFR calc non Af Amer: 8 mL/min — ABNORMAL LOW (ref >60)
GLUCOSE: 125 mg/dL — AB (ref 65–99)
Phosphorus: 4.7 mg/dL — ABNORMAL HIGH (ref 2.5–4.6)
Potassium: 4.3 mmol/L (ref 3.5–5.1)
Sodium: 132 mmol/L — ABNORMAL LOW (ref 135–145)

## 2017-07-02 LAB — CBC
HCT: 21.5 % — ABNORMAL LOW (ref 36.0–46.0)
HEMOGLOBIN: 7 g/dL — AB (ref 12.0–15.0)
MCH: 30.4 pg (ref 26.0–34.0)
MCHC: 32.6 g/dL (ref 30.0–36.0)
MCV: 93.5 fL (ref 78.0–100.0)
Platelets: 390 10*3/uL (ref 150–400)
RBC: 2.3 MIL/uL — AB (ref 3.87–5.11)
RDW: 16.4 % — ABNORMAL HIGH (ref 11.5–15.5)
WBC: 8.8 10*3/uL (ref 4.0–10.5)

## 2017-07-02 LAB — PREPARE RBC (CROSSMATCH)

## 2017-07-02 MED ORDER — SENNOSIDES-DOCUSATE SODIUM 8.6-50 MG PO TABS: 1.0000 | ORAL_TABLET | Freq: Every evening | ORAL | 0 refills | Status: DC | PRN

## 2017-07-02 MED ORDER — NICOTINE 14 MG/24HR TD PT24: 14.0000 mg | MEDICATED_PATCH | Freq: Every day | TRANSDERMAL | 0 refills | Status: DC

## 2017-07-02 MED ORDER — PANTOPRAZOLE SODIUM 40 MG PO TBEC: 40.0000 mg | DELAYED_RELEASE_TABLET | Freq: Every day | ORAL | 0 refills | Status: DC

## 2017-07-02 MED ORDER — CALCITRIOL 0.5 MCG PO CAPS
ORAL_CAPSULE | ORAL | Status: AC
  Administered 2017-07-02: 0.5 ug via ORAL
  Filled 2017-07-02: qty 1

## 2017-07-02 MED ORDER — HYDRALAZINE HCL 25 MG PO TABS: 25.0000 mg | ORAL_TABLET | Freq: Three times a day (TID) | ORAL | 0 refills | Status: DC

## 2017-07-02 MED ORDER — ATORVASTATIN CALCIUM 40 MG PO TABS: 40.0000 mg | ORAL_TABLET | Freq: Every day | ORAL | 0 refills | Status: DC

## 2017-07-02 MED ORDER — CALCITRIOL 0.5 MCG PO CAPS: 0.5000 ug | ORAL_CAPSULE | ORAL | 0 refills | Status: DC

## 2017-07-02 MED ORDER — ASPIRIN 81 MG PO TBEC: 81.0000 mg | DELAYED_RELEASE_TABLET | Freq: Every day | ORAL | 0 refills | Status: DC

## 2017-07-02 MED ORDER — SODIUM CHLORIDE 0.9 % IV SOLN
Freq: Once | INTRAVENOUS | Status: AC
  Administered 2017-07-02: 17:00:00 via INTRAVENOUS

## 2017-07-02 MED ORDER — HEPARIN SODIUM (PORCINE) 1000 UNIT/ML DIALYSIS: 20.0000 [IU]/kg | INTRAMUSCULAR | Status: DC | PRN

## 2017-07-02 MED ORDER — TRAMADOL HCL 50 MG PO TABS: 50.0000 mg | ORAL_TABLET | Freq: Four times a day (QID) | ORAL | 0 refills | Status: DC | PRN

## 2017-07-02 MED ORDER — POLYETHYLENE GLYCOL 3350 17 G PO PACK: 17.0000 g | PACK | Freq: Every day | ORAL | 0 refills | Status: DC | PRN

## 2017-07-02 MED ORDER — ACETAMINOPHEN 325 MG PO TABS: 650.0000 mg | ORAL_TABLET | Freq: Four times a day (QID) | ORAL | 0 refills | Status: DC | PRN

## 2017-07-02 MED ORDER — CALCIUM ACETATE (PHOS BINDER) 667 MG PO CAPS: 667.0000 mg | ORAL_CAPSULE | Freq: Three times a day (TID) | ORAL | 0 refills | Status: DC

## 2017-07-02 NOTE — Progress Notes (Signed)
  [facility] Progress Note   Assessment/ Plan:    72F p/w subacute CVA, h/o drug abuse, Hep C, new start HD, awaiting placement. - Possibly to [facility]   1. 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  Continue secondary prevention  2.ESRD: new this admission-- unclear what schedule she will be on as outpt as dispo is unclear, - has spot TTS at [place]- second shift. Using R femoral TDC, 1st stage BVT performed 06/16/17 by Dr, Bridgett Larsson.  Has a + Hep B core antibody but a negative Hep B surface antigen and a reactive Hep B s antibody. Next HD on Thursday/today- keep on TTS for now  3. Anemia:  Tsat 14%, getting Aranesp (increased to 150 mcg q Monday) and IV iron.  S/p 1 u prbcs 1/16.      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 Nutrition:getting nepro  6. Hypertension: on hydralazine, amlodipine, clonidine patch- - will stop patch - then work on other meds if BP stays low   7.  Dispo: needs a SNF- dispo has been difficult d/t h/o drug use. Is ready to go from renal standpoint - maybe [place]    Subjective:    Awaiting SNF. HD due today   Objective:   BP (!) 129/59 (BP Location: Right Arm)   Pulse 68   Temp 98.5 F (36.9 C) (Oral)   Resp 16   Ht 5\' 2"  (1.575 m)   Wt 52.2 kg (115 lb)   SpO2 100%   BMI 21.03 kg/m   Physical Exam: Gen: older woman, lying in bed, NAD CVS: RRR no m/r/g Resp: clear bilaterally no c/w/r VCB:[id number] nontender NABS Ext: no LE edema ACCESS: R femoral TDC in place, LUE AVF + T/B (1st stage BVT)  Labs: BMET Recent Labs  Lab 06/26/17 0921 06/27/17 0514 06/28/17 0740 06/30/17 0721 07/01/17 0523  NA 135 132* 133* 134* 136  K 4.0 4.2 4.0 6.7* 4.1  CL 100* 96* 97* 101 100*  CO2 22 24 24  21* 24  GLUCOSE 131* 88 84 87 90  BUN 60* 28* 49* 82* 38*  CREATININE 5.43* 3.31* 4.88* 6.33* 3.91*  CALCIUM 8.4* 8.5* 8.5* 8.1* 8.6*  PHOS 4.2 4.4 5.4* 6.5* 4.5   CBC Recent Labs   Lab 06/26/17 0921 06/27/17 0514 06/30/17 0721 07/01/17 1020  WBC 10.9* 10.4 8.3 8.2  NEUTROABS  --   --   --  4.5  HGB 8.3* 8.3* 7.1* 7.6*  HCT 24.4* 26.2* 21.2* 23.2*  MCV 92.1 91.9 94.2 93.5  PLT 384 401* 408* 375    @IMGRELPRIORS @ Medications:    . amLODipine  10 mg Oral Daily  . aspirin EC  81 mg Oral Daily  . atorvastatin  40 mg Oral q1800  . calcitRIOL  0.5 mcg Oral Q T,Th,Sa-HD  . calcium acetate  667 mg Oral TID WC  . darbepoetin (ARANESP) injection - DIALYSIS  150 mcg Intravenous Q Tue-HD  . feeding supplement (NEPRO CARB STEADY)  237 mL Oral BID BM  . heparin injection (subcutaneous)  5,000 Units Subcutaneous Q8H  . hydrALAZINE  25 mg Oral Q8H  . kidney failure book   Does not apply Once  . nicotine  14 mg Transdermal Daily  . pantoprazole  40 mg Oral Daily     Starla Deller A  07/02/2017, 9:20 AM
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
## 2017-07-02 NOTE — Clinical Social Work Placement (Signed)
   CLINICAL SOCIAL WORK PLACEMENT  NOTE 07/02/17 - DISCHARGED TO [facility]BULANCE  Date:  07/02/2017  Patient Details  Name: Ashley [name] MRN: [id number] Date of Birth: [birth year]  Clinical Social Work is seeking post-discharge placement for this patient at the [place] level of care (*CSW will initial, date and re-position this form in  chart as items are completed):  No(Difficult placement - consulted with CSW Microbiologist )   Patient/family provided with [place] Work Department's list of facilities offering this level of care within the geographic area requested by the patient (or if unable, by the patient's family).  No   Patient/family informed of their freedom to choose among providers that offer the needed level of care, that participate in Medicare, Medicaid or managed care program needed by the patient, have an available bed and are willing to accept the patient.  No   Patient/family informed of [facility]'s ownership interest in [facility] and [facility], as well as of the fact that they are under no obligation to receive care at these facilities.  PASRR submitted to EDS on 06/15/17     PASRR number received on 06/15/17     Existing PASRR number confirmed on       [facility] transmitted to all facilities in geographic area requested by pt/family on 06/17/17(CSW also did SNF search out of county and out of state from 06/16/17)     [place] transmitted to all facilities within larger geographic area on       Patient informed that his/her managed care company has contracts with or will negotiate with certain facilities, including the following:        Yes(Patient advised when facility located that would accept patient.)   Patient/family informed of bed offers received.  Patient chooses bed at [facility] was advised that facility would accept her)     Physician recommends and patient chooses bed at       Patient to be transferred to [facility] on 07/02/17.  Patient to be transferred to facility by Ambulance     Patient family notified on 07/02/17 of transfer.  Name of family member notified:  Onita Winchester(Patient contacted daughter by phone regarding her discharge to [facility] on 1/24 while CSW in room room)     PHYSICIAN       Additional Comment:    _______________________________________________ Sable Feil, LCSW 07/02/2017, 6:59 PM

## 2017-07-02 NOTE — Progress Notes (Signed)
Report given to [facility].Made them aware that patient will be coming to their facility late between  8-9 pm. Du to the fact that unable to give blood at HD and she presently getting it now at the time of the report.

## 2017-07-02 NOTE — Progress Notes (Signed)
Patient arrived to unit by bed.  Reviewed treatment plan and this RN agrees with plan.  Report received from bedside RN, Gwenlyn Perking.  Consent verified.  Patient A & O X 4.   Lung sounds diminished and clear to ausculation in all fields. No edema. Cardiac:  NSR.  Removed caps and cleansed R femoral  catheter with chlorhedxidine.  Aspirated ports of heparin and flushed them with saline per protocol.  Connected and secured lines, initiated treatment at 1035.  UF Goal of 2500 mL and net fluid removal 2 L.  Will continue to monitor.

## 2017-07-02 NOTE — Progress Notes (Signed)
OT Cancellation Note  Patient Details Name: Ashley [name] MRN: [id number] DOB: 1951-02-27   Cancelled Treatment:    Reason Eval/Treat Not Completed: Patient at procedure or test/ unavailable(HD) . OT will continue to follow for treatment as schedule allows Jaci Carrel 07/02/2017, 10:58 AM  Hulda Humphrey OTR/L [phone]
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
## 2017-07-02 NOTE — Procedures (Signed)
Patient was seen on dialysis and the procedure was supervised.  BFR 400  Via PC BP is  120/65.   Patient appears to be tolerating treatment well  Ashley [name] A 07/02/2017

## 2017-07-02 NOTE — Progress Notes (Signed)
Dialysis treatment completed.  2500 mL ultrafiltrated.  2000 mL net fluid removal.  Patient status unchanged. Lung sounds diminished to ausculation in all fields. No edema. Cardiac: NSR.  Cleansed R femoral catheter with chlorhexidine.  Disconnected lines and flushed ports with saline per protocol.  Ports locked with heparin and capped per protocol.    Report given to bedside, RN Gwenlyn Perking.

## 2017-07-02 NOTE — Progress Notes (Signed)
PT Cancellation Note  Patient Details Name: Ashley [name] MRN: [id number] DOB: 1950-09-26   Cancelled Treatment:    Reason Eval/Treat Not Completed: Patient at procedure or test/unavailable. Pt off of the floor for HD. PT will continue to f/u with pt as available.    [place] 07/02/2017, 11:09 AM

## 2017-07-02 NOTE — Progress Notes (Signed)
[facility] [phone].

## 2017-07-03 LAB — TYPE AND SCREEN
ABO/RH(D): A NEG
Antibody Screen: NEGATIVE
DAT, IgG: NEGATIVE
UNIT DIVISION: 0
Unit division: 0

## 2017-07-03 LAB — BPAM RBC
Blood Product Expiration Date: 201902112359
Blood Product Expiration Date: 201902212359
ISSUE DATE / TIME: 201901241628
Unit Type and Rh: 600
Unit Type and Rh: 600

## 2017-07-04 DIAGNOSIS — D631 Anemia in chronic kidney disease: Secondary | ICD-10-CM | POA: Diagnosis not present

## 2017-07-04 DIAGNOSIS — N2581 Secondary hyperparathyroidism of renal origin: Secondary | ICD-10-CM | POA: Diagnosis not present

## 2017-07-04 DIAGNOSIS — Z23 Encounter for immunization: Secondary | ICD-10-CM | POA: Diagnosis not present

## 2017-07-04 DIAGNOSIS — T8249XD Other complication of vascular dialysis catheter, subsequent encounter: Secondary | ICD-10-CM | POA: Diagnosis not present

## 2017-07-04 DIAGNOSIS — D689 Coagulation defect, unspecified: Secondary | ICD-10-CM | POA: Diagnosis not present

## 2017-07-04 DIAGNOSIS — D509 Iron deficiency anemia, unspecified: Secondary | ICD-10-CM | POA: Diagnosis not present

## 2017-07-04 DIAGNOSIS — N186 End stage renal disease: Secondary | ICD-10-CM | POA: Diagnosis not present

## 2017-07-07 DIAGNOSIS — N2581 Secondary hyperparathyroidism of renal origin: Secondary | ICD-10-CM | POA: Diagnosis not present

## 2017-07-07 DIAGNOSIS — T8249XD Other complication of vascular dialysis catheter, subsequent encounter: Secondary | ICD-10-CM | POA: Diagnosis not present

## 2017-07-07 DIAGNOSIS — D689 Coagulation defect, unspecified: Secondary | ICD-10-CM | POA: Diagnosis not present

## 2017-07-07 DIAGNOSIS — Z23 Encounter for immunization: Secondary | ICD-10-CM | POA: Diagnosis not present

## 2017-07-07 DIAGNOSIS — D509 Iron deficiency anemia, unspecified: Secondary | ICD-10-CM | POA: Diagnosis not present

## 2017-07-07 DIAGNOSIS — D631 Anemia in chronic kidney disease: Secondary | ICD-10-CM | POA: Diagnosis not present

## 2017-07-07 DIAGNOSIS — N186 End stage renal disease: Secondary | ICD-10-CM | POA: Diagnosis not present

## 2017-07-08 DIAGNOSIS — I10 Essential (primary) hypertension: Secondary | ICD-10-CM | POA: Diagnosis not present

## 2017-07-08 DIAGNOSIS — N185 Chronic kidney disease, stage 5: Secondary | ICD-10-CM | POA: Diagnosis not present

## 2017-07-08 DIAGNOSIS — I639 Cerebral infarction, unspecified: Secondary | ICD-10-CM | POA: Diagnosis not present

## 2017-07-09 DIAGNOSIS — Z23 Encounter for immunization: Secondary | ICD-10-CM | POA: Diagnosis not present

## 2017-07-09 DIAGNOSIS — D509 Iron deficiency anemia, unspecified: Secondary | ICD-10-CM | POA: Diagnosis not present

## 2017-07-09 DIAGNOSIS — D631 Anemia in chronic kidney disease: Secondary | ICD-10-CM | POA: Diagnosis not present

## 2017-07-09 DIAGNOSIS — D689 Coagulation defect, unspecified: Secondary | ICD-10-CM | POA: Diagnosis not present

## 2017-07-09 DIAGNOSIS — N186 End stage renal disease: Secondary | ICD-10-CM | POA: Diagnosis not present

## 2017-07-09 DIAGNOSIS — T8249XD Other complication of vascular dialysis catheter, subsequent encounter: Secondary | ICD-10-CM | POA: Diagnosis not present

## 2017-07-09 DIAGNOSIS — N2581 Secondary hyperparathyroidism of renal origin: Secondary | ICD-10-CM | POA: Diagnosis not present

## 2017-07-10 DIAGNOSIS — Z992 Dependence on renal dialysis: Secondary | ICD-10-CM | POA: Diagnosis not present

## 2017-07-10 DIAGNOSIS — N186 End stage renal disease: Secondary | ICD-10-CM | POA: Diagnosis not present

## 2017-07-10 DIAGNOSIS — N179 Acute kidney failure, unspecified: Secondary | ICD-10-CM | POA: Diagnosis not present

## 2017-07-11 DIAGNOSIS — N2581 Secondary hyperparathyroidism of renal origin: Secondary | ICD-10-CM | POA: Diagnosis not present

## 2017-07-11 DIAGNOSIS — D509 Iron deficiency anemia, unspecified: Secondary | ICD-10-CM | POA: Diagnosis not present

## 2017-07-11 DIAGNOSIS — T8249XD Other complication of vascular dialysis catheter, subsequent encounter: Secondary | ICD-10-CM | POA: Diagnosis not present

## 2017-07-11 DIAGNOSIS — D689 Coagulation defect, unspecified: Secondary | ICD-10-CM | POA: Diagnosis not present

## 2017-07-11 DIAGNOSIS — D631 Anemia in chronic kidney disease: Secondary | ICD-10-CM | POA: Diagnosis not present

## 2017-07-11 DIAGNOSIS — Z992 Dependence on renal dialysis: Secondary | ICD-10-CM | POA: Diagnosis not present

## 2017-07-11 DIAGNOSIS — N186 End stage renal disease: Secondary | ICD-10-CM | POA: Diagnosis not present

## 2017-07-14 DIAGNOSIS — D689 Coagulation defect, unspecified: Secondary | ICD-10-CM | POA: Diagnosis not present

## 2017-07-14 DIAGNOSIS — T8249XD Other complication of vascular dialysis catheter, subsequent encounter: Secondary | ICD-10-CM | POA: Diagnosis not present

## 2017-07-14 DIAGNOSIS — N186 End stage renal disease: Secondary | ICD-10-CM | POA: Diagnosis not present

## 2017-07-14 DIAGNOSIS — D509 Iron deficiency anemia, unspecified: Secondary | ICD-10-CM | POA: Diagnosis not present

## 2017-07-14 DIAGNOSIS — D631 Anemia in chronic kidney disease: Secondary | ICD-10-CM | POA: Diagnosis not present

## 2017-07-14 DIAGNOSIS — Z992 Dependence on renal dialysis: Secondary | ICD-10-CM | POA: Diagnosis not present

## 2017-07-14 DIAGNOSIS — N2581 Secondary hyperparathyroidism of renal origin: Secondary | ICD-10-CM | POA: Diagnosis not present

## 2017-07-16 DIAGNOSIS — D509 Iron deficiency anemia, unspecified: Secondary | ICD-10-CM | POA: Diagnosis not present

## 2017-07-16 DIAGNOSIS — N2581 Secondary hyperparathyroidism of renal origin: Secondary | ICD-10-CM | POA: Diagnosis not present

## 2017-07-16 DIAGNOSIS — Z992 Dependence on renal dialysis: Secondary | ICD-10-CM | POA: Diagnosis not present

## 2017-07-16 DIAGNOSIS — D689 Coagulation defect, unspecified: Secondary | ICD-10-CM | POA: Diagnosis not present

## 2017-07-16 DIAGNOSIS — N186 End stage renal disease: Secondary | ICD-10-CM | POA: Diagnosis not present

## 2017-07-16 DIAGNOSIS — T8249XD Other complication of vascular dialysis catheter, subsequent encounter: Secondary | ICD-10-CM | POA: Diagnosis not present

## 2017-07-16 DIAGNOSIS — D631 Anemia in chronic kidney disease: Secondary | ICD-10-CM | POA: Diagnosis not present

## 2017-07-18 DIAGNOSIS — N2581 Secondary hyperparathyroidism of renal origin: Secondary | ICD-10-CM | POA: Diagnosis not present

## 2017-07-18 DIAGNOSIS — D689 Coagulation defect, unspecified: Secondary | ICD-10-CM | POA: Diagnosis not present

## 2017-07-18 DIAGNOSIS — D509 Iron deficiency anemia, unspecified: Secondary | ICD-10-CM | POA: Diagnosis not present

## 2017-07-18 DIAGNOSIS — T8249XD Other complication of vascular dialysis catheter, subsequent encounter: Secondary | ICD-10-CM | POA: Diagnosis not present

## 2017-07-18 DIAGNOSIS — D631 Anemia in chronic kidney disease: Secondary | ICD-10-CM | POA: Diagnosis not present

## 2017-07-18 DIAGNOSIS — Z992 Dependence on renal dialysis: Secondary | ICD-10-CM | POA: Diagnosis not present

## 2017-07-18 DIAGNOSIS — N186 End stage renal disease: Secondary | ICD-10-CM | POA: Diagnosis not present

## 2017-07-18 DIAGNOSIS — H2513 Age-related nuclear cataract, bilateral: Secondary | ICD-10-CM | POA: Diagnosis not present

## 2017-07-21 DIAGNOSIS — D689 Coagulation defect, unspecified: Secondary | ICD-10-CM | POA: Diagnosis not present

## 2017-07-21 DIAGNOSIS — T8249XD Other complication of vascular dialysis catheter, subsequent encounter: Secondary | ICD-10-CM | POA: Diagnosis not present

## 2017-07-21 DIAGNOSIS — Z992 Dependence on renal dialysis: Secondary | ICD-10-CM | POA: Diagnosis not present

## 2017-07-21 DIAGNOSIS — N186 End stage renal disease: Secondary | ICD-10-CM | POA: Diagnosis not present

## 2017-07-21 DIAGNOSIS — N2581 Secondary hyperparathyroidism of renal origin: Secondary | ICD-10-CM | POA: Diagnosis not present

## 2017-07-21 DIAGNOSIS — D509 Iron deficiency anemia, unspecified: Secondary | ICD-10-CM | POA: Diagnosis not present

## 2017-07-21 DIAGNOSIS — D631 Anemia in chronic kidney disease: Secondary | ICD-10-CM | POA: Diagnosis not present

## 2017-07-23 DIAGNOSIS — D689 Coagulation defect, unspecified: Secondary | ICD-10-CM | POA: Diagnosis not present

## 2017-07-23 DIAGNOSIS — T8249XD Other complication of vascular dialysis catheter, subsequent encounter: Secondary | ICD-10-CM | POA: Diagnosis not present

## 2017-07-23 DIAGNOSIS — Z992 Dependence on renal dialysis: Secondary | ICD-10-CM | POA: Diagnosis not present

## 2017-07-23 DIAGNOSIS — D631 Anemia in chronic kidney disease: Secondary | ICD-10-CM | POA: Diagnosis not present

## 2017-07-23 DIAGNOSIS — N2581 Secondary hyperparathyroidism of renal origin: Secondary | ICD-10-CM | POA: Diagnosis not present

## 2017-07-23 DIAGNOSIS — D509 Iron deficiency anemia, unspecified: Secondary | ICD-10-CM | POA: Diagnosis not present

## 2017-07-23 DIAGNOSIS — N186 End stage renal disease: Secondary | ICD-10-CM | POA: Diagnosis not present

## 2017-07-25 DIAGNOSIS — D689 Coagulation defect, unspecified: Secondary | ICD-10-CM | POA: Diagnosis not present

## 2017-07-25 DIAGNOSIS — N186 End stage renal disease: Secondary | ICD-10-CM | POA: Diagnosis not present

## 2017-07-25 DIAGNOSIS — D631 Anemia in chronic kidney disease: Secondary | ICD-10-CM | POA: Diagnosis not present

## 2017-07-25 DIAGNOSIS — N2581 Secondary hyperparathyroidism of renal origin: Secondary | ICD-10-CM | POA: Diagnosis not present

## 2017-07-25 DIAGNOSIS — D509 Iron deficiency anemia, unspecified: Secondary | ICD-10-CM | POA: Diagnosis not present

## 2017-07-25 DIAGNOSIS — T8249XD Other complication of vascular dialysis catheter, subsequent encounter: Secondary | ICD-10-CM | POA: Diagnosis not present

## 2017-07-25 DIAGNOSIS — Z992 Dependence on renal dialysis: Secondary | ICD-10-CM | POA: Diagnosis not present

## 2017-07-28 DIAGNOSIS — D631 Anemia in chronic kidney disease: Secondary | ICD-10-CM | POA: Diagnosis not present

## 2017-07-28 DIAGNOSIS — D689 Coagulation defect, unspecified: Secondary | ICD-10-CM | POA: Diagnosis not present

## 2017-07-28 DIAGNOSIS — T8249XD Other complication of vascular dialysis catheter, subsequent encounter: Secondary | ICD-10-CM | POA: Diagnosis not present

## 2017-07-28 DIAGNOSIS — N2581 Secondary hyperparathyroidism of renal origin: Secondary | ICD-10-CM | POA: Diagnosis not present

## 2017-07-28 DIAGNOSIS — N186 End stage renal disease: Secondary | ICD-10-CM | POA: Diagnosis not present

## 2017-07-28 DIAGNOSIS — Z992 Dependence on renal dialysis: Secondary | ICD-10-CM | POA: Diagnosis not present

## 2017-07-28 DIAGNOSIS — D509 Iron deficiency anemia, unspecified: Secondary | ICD-10-CM | POA: Diagnosis not present

## 2017-07-29 ENCOUNTER — Other Ambulatory Visit: Payer: Self-pay | Admitting: *Deleted

## 2017-07-29 ENCOUNTER — Telehealth: Payer: Self-pay | Admitting: *Deleted

## 2017-07-29 ENCOUNTER — Encounter: Payer: Medicare Other | Admitting: Vascular Surgery

## 2017-07-29 ENCOUNTER — Encounter: Payer: Self-pay | Admitting: *Deleted

## 2017-07-29 DIAGNOSIS — I639 Cerebral infarction, unspecified: Secondary | ICD-10-CM | POA: Diagnosis not present

## 2017-07-29 DIAGNOSIS — D649 Anemia, unspecified: Secondary | ICD-10-CM | POA: Diagnosis not present

## 2017-07-29 DIAGNOSIS — N186 End stage renal disease: Secondary | ICD-10-CM | POA: Diagnosis not present

## 2017-07-29 DIAGNOSIS — I10 Essential (primary) hypertension: Secondary | ICD-10-CM | POA: Diagnosis not present

## 2017-07-29 NOTE — Pre-Procedure Instructions (Signed)
    Ashley [name]  07/29/2017      [id number] [address], [place] - [place] [address] [place] [address] Phone: [phone] Fax: [phone]  [place] [address]), [place] [address] [place] [address] Phone: [phone] Fax: [phone]  [facility] [place], [place] [address] [place] [address] [place] [address] Phone: [phone] Fax: [phone]    Your procedure is scheduled on Friday, July 31, 2017  [facility] to [facility] Admitting at 7:00 A.M.  Call this number if you have problems the morning of surgery:  [phone]   Remember:  Do not eat food or drink liquids after midnight.  Take these medicines the morning of surgery with A SIP OF WATER : amLODipine (NORVASC),  hydrALAZINE (APRESOLINE), pantoprazole (PROTONIX), if needed: pain medication, ondansetron (ZOFRAN ODT) for nausea or vomiting. Stop taking vitamins, fish oil and herbal medications. Do not take any NSAIDs ie: Ibuprofen, Advil, Naproxen (Aleve), Motrin, BC and Goody Powder; stop now.   Do not wear jewelry, make-up or nail polish.  Do not wear lotions, powders, or perfumes, or deodorant.  Do not shave 48 hours prior to surgery.    Do not bring valuables to the hospital.  [facility] is not responsible for any belongings or valuables.  Contacts, dentures or bridgework may not be worn into surgery.  Leave your suitcase in the car.  After surgery it may be brought to your room. Patients discharged the day of surgery will not be allowed to drive home. Please read over the following fact sheets that you were given. Call to confirm receipt of fax: Allyne Gee, RN, BSN @ [phone]- Thank You

## 2017-07-29 NOTE — Progress Notes (Signed)
Referral from [facility] for [facility] exchange due to "poor functioning TDC".  Verified patient is able to give consent per kidney center.

## 2017-07-29 NOTE — Telephone Encounter (Signed)
Call to [facility] at [facility] and reviewed instructions (see letter).They will handle transportation. I also faxed instruction letter to [facility].

## 2017-07-29 NOTE — Progress Notes (Signed)
Spoke to patient and to nurse at [facility]. To be at [facility] at 7 am on 07/31/17 for [facility] exchange. NPO past [place] night prior and expect call and follow the instructions from the hospital pre-admission department. Letter faxed to [facility] with instructions.

## 2017-07-30 ENCOUNTER — Encounter (HOSPITAL_COMMUNITY): Payer: Self-pay | Admitting: *Deleted

## 2017-07-30 ENCOUNTER — Other Ambulatory Visit: Payer: Self-pay

## 2017-07-30 DIAGNOSIS — T8249XD Other complication of vascular dialysis catheter, subsequent encounter: Secondary | ICD-10-CM | POA: Diagnosis not present

## 2017-07-30 DIAGNOSIS — N2581 Secondary hyperparathyroidism of renal origin: Secondary | ICD-10-CM | POA: Diagnosis not present

## 2017-07-30 DIAGNOSIS — D689 Coagulation defect, unspecified: Secondary | ICD-10-CM | POA: Diagnosis not present

## 2017-07-30 DIAGNOSIS — Z992 Dependence on renal dialysis: Secondary | ICD-10-CM | POA: Diagnosis not present

## 2017-07-30 DIAGNOSIS — D509 Iron deficiency anemia, unspecified: Secondary | ICD-10-CM | POA: Diagnosis not present

## 2017-07-30 DIAGNOSIS — N186 End stage renal disease: Secondary | ICD-10-CM | POA: Diagnosis not present

## 2017-07-30 DIAGNOSIS — D631 Anemia in chronic kidney disease: Secondary | ICD-10-CM | POA: Diagnosis not present

## 2017-07-30 NOTE — Progress Notes (Signed)
SDW-Pre-op call completed by pt nurse Cloyd Stagers, RN of [facility]. Pt unavailable, please complete anesthesia assessment  and Apnea Screening with pt on DOS. RN called and confirmed receipt of fax. RN verbalized understanding of all pre-op instructions.

## 2017-07-31 ENCOUNTER — Encounter (HOSPITAL_COMMUNITY)
Admission: RE | Disposition: A | Discharge status: Home / Self Care | Payer: Self-pay | Source: Ambulatory Visit | Attending: Vascular Surgery

## 2017-07-31 ENCOUNTER — Ambulatory Visit (HOSPITAL_COMMUNITY)
Admission: RE | Admit: 2017-07-31 | Discharge: 2017-07-31 | Disposition: A | Discharge status: Home / Self Care | Payer: Medicare Other | Source: Ambulatory Visit | Attending: Vascular Surgery | Admitting: Vascular Surgery

## 2017-07-31 ENCOUNTER — Ambulatory Visit (HOSPITAL_COMMUNITY): Payer: Medicare Other | Admitting: Anesthesiology

## 2017-07-31 ENCOUNTER — Ambulatory Visit (HOSPITAL_COMMUNITY): Payer: Medicare Other

## 2017-07-31 ENCOUNTER — Encounter (HOSPITAL_COMMUNITY): Payer: Self-pay | Admitting: Urology

## 2017-07-31 ENCOUNTER — Telehealth: Payer: Self-pay | Admitting: Vascular Surgery

## 2017-07-31 ENCOUNTER — Telehealth: Payer: Self-pay | Admitting: Family Medicine

## 2017-07-31 DIAGNOSIS — N186 End stage renal disease: Secondary | ICD-10-CM

## 2017-07-31 DIAGNOSIS — T8241XA Breakdown (mechanical) of vascular dialysis catheter, initial encounter: Secondary | ICD-10-CM | POA: Insufficient documentation

## 2017-07-31 DIAGNOSIS — Z95828 Presence of other vascular implants and grafts: Secondary | ICD-10-CM

## 2017-07-31 DIAGNOSIS — T82898A Other specified complication of vascular prosthetic devices, implants and grafts, initial encounter: Secondary | ICD-10-CM | POA: Diagnosis not present

## 2017-07-31 DIAGNOSIS — Z452 Encounter for adjustment and management of vascular access device: Secondary | ICD-10-CM | POA: Diagnosis not present

## 2017-07-31 DIAGNOSIS — I120 Hypertensive chronic kidney disease with stage 5 chronic kidney disease or end stage renal disease: Secondary | ICD-10-CM | POA: Insufficient documentation

## 2017-07-31 DIAGNOSIS — Z8673 Personal history of transient ischemic attack (TIA), and cerebral infarction without residual deficits: Secondary | ICD-10-CM | POA: Diagnosis not present

## 2017-07-31 DIAGNOSIS — Y832 Surgical operation with anastomosis, bypass or graft as the cause of abnormal reaction of the patient, or of later complication, without mention of misadventure at the time of the procedure: Secondary | ICD-10-CM | POA: Insufficient documentation

## 2017-07-31 DIAGNOSIS — B1920 Unspecified viral hepatitis C without hepatic coma: Secondary | ICD-10-CM | POA: Insufficient documentation

## 2017-07-31 DIAGNOSIS — Z87891 Personal history of nicotine dependence: Secondary | ICD-10-CM | POA: Diagnosis not present

## 2017-07-31 DIAGNOSIS — Z992 Dependence on renal dialysis: Secondary | ICD-10-CM | POA: Insufficient documentation

## 2017-07-31 HISTORY — DX: Gastro-esophageal reflux disease without esophagitis: K21.9

## 2017-07-31 HISTORY — DX: Cerebral infarction, unspecified: I63.9

## 2017-07-31 HISTORY — PX: EXCHANGE OF A DIALYSIS CATHETER: SHX5818

## 2017-07-31 LAB — POCT I-STAT 4, (NA,K, GLUC, HGB,HCT)
Glucose, Bld: 89 mg/dL (ref 65–99)
HCT: 26 % — ABNORMAL LOW (ref 36.0–46.0)
HEMOGLOBIN: 8.8 g/dL — AB (ref 12.0–15.0)
POTASSIUM: 3.7 mmol/L (ref 3.5–5.1)
Sodium: 138 mmol/L (ref 135–145)

## 2017-07-31 SURGERY — EXCHANGE OF A DIALYSIS CATHETER
Anesthesia: General | Site: Groin | Laterality: Left

## 2017-07-31 MED ORDER — PROPOFOL 500 MG/50ML IV EMUL
INTRAVENOUS | Status: DC | PRN
  Administered 2017-07-31: 75 ug/kg/min via INTRAVENOUS

## 2017-07-31 MED ORDER — HEPARIN SODIUM (PORCINE) 1000 UNIT/ML IJ SOLN
INTRAMUSCULAR | Status: DC | PRN
  Administered 2017-07-31: 6 [IU] via INTRAVENOUS

## 2017-07-31 MED ORDER — LIDOCAINE-EPINEPHRINE 0.5 %-1:200000 IJ SOLN
INTRAMUSCULAR | Status: AC
  Filled 2017-07-31: qty 1

## 2017-07-31 MED ORDER — HYDROMORPHONE HCL 1 MG/ML IJ SOLN: 0.2500 mg | INTRAMUSCULAR | Status: DC | PRN

## 2017-07-31 MED ORDER — PROMETHAZINE HCL 25 MG/ML IJ SOLN: 6.2500 mg | INTRAMUSCULAR | Status: DC | PRN

## 2017-07-31 MED ORDER — 0.9 % SODIUM CHLORIDE (POUR BTL) OPTIME
TOPICAL | Status: DC | PRN
  Administered 2017-07-31: 1000 mL

## 2017-07-31 MED ORDER — HEPARIN SODIUM (PORCINE) 1000 UNIT/ML IJ SOLN
INTRAMUSCULAR | Status: AC
  Filled 2017-07-31: qty 1

## 2017-07-31 MED ORDER — TRAMADOL HCL 50 MG PO TABS
50.0000 mg | ORAL_TABLET | Freq: Once | ORAL | Status: AC
  Administered 2017-07-31: 50 mg via ORAL

## 2017-07-31 MED ORDER — PHENYLEPHRINE 40 MCG/ML (10ML) SYRINGE FOR IV PUSH (FOR BLOOD PRESSURE SUPPORT)
PREFILLED_SYRINGE | INTRAVENOUS | Status: AC
  Filled 2017-07-31: qty 10

## 2017-07-31 MED ORDER — LIDOCAINE-EPINEPHRINE 0.5 %-1:200000 IJ SOLN
INTRAMUSCULAR | Status: DC | PRN
  Administered 2017-07-31: 8 mL

## 2017-07-31 MED ORDER — TRAMADOL HCL 50 MG PO TABS
ORAL_TABLET | ORAL | Status: AC
  Filled 2017-07-31: qty 1

## 2017-07-31 MED ORDER — PROPOFOL 10 MG/ML IV BOLUS
INTRAVENOUS | Status: AC
  Filled 2017-07-31: qty 40

## 2017-07-31 MED ORDER — FENTANYL CITRATE (PF) 250 MCG/5ML IJ SOLN
INTRAMUSCULAR | Status: AC
  Filled 2017-07-31: qty 5

## 2017-07-31 MED ORDER — FENTANYL CITRATE (PF) 100 MCG/2ML IJ SOLN
INTRAMUSCULAR | Status: DC | PRN
  Administered 2017-07-31: 25 ug via INTRAVENOUS
  Administered 2017-07-31: 50 ug via INTRAVENOUS
  Administered 2017-07-31: 25 ug via INTRAVENOUS
  Administered 2017-07-31: 50 ug via INTRAVENOUS

## 2017-07-31 MED ORDER — PROPOFOL 10 MG/ML IV BOLUS
INTRAVENOUS | Status: DC | PRN
  Administered 2017-07-31: 20 mg via INTRAVENOUS

## 2017-07-31 MED ORDER — SODIUM CHLORIDE 0.9 % IV SOLN
INTRAVENOUS | Status: DC
  Administered 2017-07-31 (×2): via INTRAVENOUS

## 2017-07-31 MED ORDER — SODIUM CHLORIDE 0.9 % IV SOLN
INTRAVENOUS | Status: DC | PRN
  Administered 2017-07-31: 10:00:00

## 2017-07-31 MED ORDER — CEFUROXIME SODIUM 1.5 G IV SOLR
INTRAVENOUS | Status: AC
  Filled 2017-07-31: qty 1.5

## 2017-07-31 MED ORDER — SODIUM CHLORIDE 0.9 % IV SOLN
1.5000 g | INTRAVENOUS | Status: AC
  Administered 2017-07-31: 1.5 g via INTRAVENOUS

## 2017-07-31 MED ORDER — CHLORHEXIDINE GLUCONATE 4 % EX LIQD: 60.0000 mL | Freq: Once | CUTANEOUS | Status: DC

## 2017-07-31 SURGICAL SUPPLY — 36 items
BAG DECANTER FOR FLEXI CONT (MISCELLANEOUS) ×2 IMPLANT
BIOPATCH RED 1 DISK 7.0 (GAUZE/BANDAGES/DRESSINGS) ×2 IMPLANT
CATH PALINDROME RT-P 15FX19CM (CATHETERS) IMPLANT
CATH PALINDROME RT-P 15FX23CM (CATHETERS) IMPLANT
CATH PALINDROME RT-P 15FX28CM (CATHETERS) IMPLANT
CATH PALINDROME RT-P 15FX55CM (CATHETERS) ×2 IMPLANT
COVER PROBE W GEL 5X96 (DRAPES) IMPLANT
COVER SURGICAL LIGHT HANDLE (MISCELLANEOUS) ×2 IMPLANT
DECANTER SPIKE VIAL GLASS SM (MISCELLANEOUS) ×2 IMPLANT
DRAPE C-ARM 42X72 X-RAY (DRAPES) ×2 IMPLANT
DRAPE CHEST BREAST 15X10 FENES (DRAPES) ×2 IMPLANT
DRSG TEGADERM 2-3/8X2-3/4 SM (GAUZE/BANDAGES/DRESSINGS) ×2 IMPLANT
GAUZE SPONGE 4X4 12PLY STRL LF (GAUZE/BANDAGES/DRESSINGS) ×4 IMPLANT
GLOVE SS BIOGEL STRL SZ 7.5 (GLOVE) ×1 IMPLANT
GLOVE SUPERSENSE BIOGEL SZ 7.5 (GLOVE) ×1
GOWN STRL REUS W/ TWL LRG LVL3 (GOWN DISPOSABLE) ×3 IMPLANT
GOWN STRL REUS W/TWL LRG LVL3 (GOWN DISPOSABLE) ×3
KIT BASIN OR (CUSTOM PROCEDURE TRAY) ×2 IMPLANT
KIT ROOM TURNOVER OR (KITS) ×2 IMPLANT
NEEDLE 18GX1X1/2 (RX/OR ONLY) (NEEDLE) ×2 IMPLANT
NEEDLE 22X1 1/2 (OR ONLY) (NEEDLE) ×2 IMPLANT
NEEDLE HYPO 25GX1X1/2 BEV (NEEDLE) ×2 IMPLANT
NS IRRIG 1000ML POUR BTL (IV SOLUTION) ×2 IMPLANT
PACK SURGICAL SETUP 50X90 (CUSTOM PROCEDURE TRAY) ×2 IMPLANT
PAD ARMBOARD 7.5X6 YLW CONV (MISCELLANEOUS) ×4 IMPLANT
SOAP 2 % CHG 4 OZ (WOUND CARE) ×2 IMPLANT
SUT ETHILON 3 0 PS 1 (SUTURE) ×2 IMPLANT
SUT VICRYL 4-0 PS2 18IN ABS (SUTURE) ×2 IMPLANT
SYR 10ML LL (SYRINGE) ×2 IMPLANT
SYR 20CC LL (SYRINGE) ×2 IMPLANT
SYR 30ML LL (SYRINGE) IMPLANT
SYR 5ML LL (SYRINGE) ×4 IMPLANT
SYR CONTROL 10ML LL (SYRINGE) ×2 IMPLANT
TAPE CLOTH SURG 4X10 WHT LF (GAUZE/BANDAGES/DRESSINGS) ×4 IMPLANT
TOWEL GREEN STERILE (TOWEL DISPOSABLE) ×2 IMPLANT
WATER STERILE IRR 1000ML POUR (IV SOLUTION) ×2 IMPLANT

## 2017-07-31 NOTE — Anesthesia Procedure Notes (Signed)
Procedure Name: MAC Date/Time: 07/31/2017 9:18 AM Performed by: Eligha Bridegroom, CRNA Pre-anesthesia Checklist: Patient identified, Emergency Drugs available, Suction available and Patient being monitored Patient Re-evaluated:Patient Re-evaluated prior to induction Oxygen Delivery Method: Nasal cannula Preoxygenation: Pre-oxygenation with 100% oxygen Induction Type: IV induction

## 2017-07-31 NOTE — Telephone Encounter (Signed)
Pt had outpatient surgery today and is in pain.  Her Tylenol is not working. It was suggested she contact the dr who did the surgery but she said she had and they would give her anything. She is a resident at [facility].  It was suggested she talk to the nurses or dr there. She says there is no dr there.  She is wanting something for the pain.

## 2017-07-31 NOTE — Op Note (Signed)
    OPERATIVE REPORT  DATE OF SURGERY: 07/31/2017  PATIENT: Ashley [name], 67 y.o. female MRN: [id number]  DOB: 1951-03-18  PRE-OPERATIVE DIAGNOSIS: End-stage renal disease with poorly functioning right femoral hemodialysis catheter  POST-OPERATIVE DIAGNOSIS:  Same  PROCEDURE: Placement of new left femoral vein tunneled hemodialysis catheter, removal of right femoral catheter  SURGEON:  Curt Jews, M.D.  PHYSICIAN ASSISTANT: Nurse  ANESTHESIA: Local with sedation  EBL: Minimal ml  Total I/O In: 640 [P.O.:240; I.V.:300; Other:100] Out: 5 [Blood:5]  BLOOD ADMINISTERED: None  DRAINS: None  SPECIMEN: None  COUNTS CORRECT:  YES  PLAN OF CARE: PACU stable  PATIENT DISPOSITION:  PACU - hemodynamically stable  PROCEDURE DETAILS: The patient was taken to the operating room placed supine position where the area of the right and left groins and the right catheter was prepped and draped in the usual sterile fashion.  Using local anesthesia and sinus site visualization the left common femoral vein was entered with a guidewire through an 18-gauge needle.  The guidewire was passed centrally and this was confirmed with fluoroscopy.  Dilator and peel-away sheath was passed over the guidewire and the dilator was removed.  The 55 cm tunneled catheter was passed over the guidewire and through the peel-away sheath and was positioned at the level of the mid right atrium.  The existing catheter from the right side was actually with the tips in the innominate vein.  The left femoral catheter was brought through a separate stab incision and the 2 lm ports were attached and both lumens flushed and aspirated easily and were locked with 1000 unit/cc heparin.  The catheter was secured to the skin with a 3-0 nylon stitch and the entry site was closed with a 4-0 subcuticular Vicryl stitch.  Next the right femoral catheter was removed by initially instilling local anesthesia around the exit site.  The cuff was  mobilized and the catheter was removed.  The patient tolerated this procedure without immediate complication was transferred to the recovery room where chest x-rays pending   Rosetta Posner, M.D., [facility] 07/31/2017 11:03 AM

## 2017-07-31 NOTE — Telephone Encounter (Signed)
Will forward to MD to advise. Marcellas Marchant,CMA  

## 2017-07-31 NOTE — Anesthesia Preprocedure Evaluation (Addendum)
Anesthesia Evaluation  Patient identified by MRN, date of birth, ID band Patient awake    Reviewed: Allergy & Precautions, NPO status , Patient's Chart, lab work & pertinent test results  History of Anesthesia Complications Negative for: history of anesthetic complications  Airway Mallampati: II  TM Distance: >3 FB Neck ROM: Full    Dental no notable dental hx. (+) Dental Advisory Given   Pulmonary Current Smoker, former smoker,    Pulmonary exam normal        Cardiovascular hypertension, Pt. on medications Normal cardiovascular exam  Left ventricle:  The cavity size was normal. Wall thickness was increased increased in a pattern of mild to moderate LVH. Systolic function was normal. The estimated ejection fraction was in the range of 60% to 65%. Wall motion was normal;   Neuro/Psych CVA, Residual Symptoms negative psych ROS   GI/Hepatic hiatal hernia, PUD, (+) Hepatitis -, C  Endo/Other    Renal/GU Renal disease     Musculoskeletal   Abdominal   Peds  Hematology   Anesthesia Other Findings   Reproductive/Obstetrics                            Anesthesia Physical  Anesthesia Plan  ASA: III  Anesthesia Plan: MAC   Post-op Pain Management:    Induction: Intravenous  PONV Risk Score and Plan: 2 and Ondansetron, Dexamethasone and Treatment may vary due to age or medical condition  Airway Management Planned: Natural Airway and Simple Face Mask  Additional Equipment:   Intra-op Plan:   Post-operative Plan:   Informed Consent: I have reviewed the patients History and Physical, chart, labs and discussed the procedure including the risks, benefits and alternatives for the proposed anesthesia with the patient or authorized representative who has indicated his/her understanding and acceptance.   Dental advisory given  Plan Discussed with: CRNA and Anesthesiologist  Anesthesia Plan  Comments:        Anesthesia Quick Evaluation

## 2017-07-31 NOTE — Anesthesia Postprocedure Evaluation (Signed)
Anesthesia Post Note  Patient: Ashley [name]  Procedure(s) Performed: Removal  OF A  Right GroinTUNNELED  DIALYSIS CATHETER ,  Insertion of Left Femoral Dialysis Catheter. (Left Groin)     Anesthesia Post Evaluation  Last Vitals:  Vitals:   07/31/17 0757 07/31/17 1015  BP: (!) 145/65   Pulse: 81 79  Resp: 18 (!) 29  Temp: 36.6 C (!) 36.1 C  SpO2: 100% 100%    Last Pain:  Vitals:   07/31/17 0757  TempSrc: Oral                 [name]

## 2017-07-31 NOTE — Telephone Encounter (Signed)
Appt was sched for 07/29/17 at 11:30. Appt given to pt. Pt called back to cancel the appt. She stated she will call back to resch when she is ready.

## 2017-07-31 NOTE — H&P (Signed)
Patient name: Ashley [name] MRN: [id number] DOB: 28-Aug-1950 Sex: female    HPI: Ashley [name] is a 67 y.o. female presenting for surgery today.  She had placement of a left arm AV fistula and right femoral vein hemodialysis catheter by Dr. Claudie Fisherman on 06/17/2007.  At that same procedure she had access of both internal jugular veins and could not thread the wire proximally.  Therefore had replacement of a right femoral vein catheter.  She has had poor functioning of this and is here today for exchange  Current Facility-Administered Medications  Medication Dose Route Frequency Provider Last Rate Last Dose  . 0.9 %  sodium chloride infusion   Intravenous Continuous Zailyn Thoennes, Kristen Loader, MD      . cefUROXime (ZINACEF) 1.5 g in sodium chloride 0.9 % 100 mL IVPB  1.5 g Intravenous 30 min Pre-Op Ladarian Bonczek, Kristen Loader, MD      . chlorhexidine (HIBICLENS) 4 % liquid 4 application  60 mL Topical Once Gearald Stonebraker, Kristen Loader, MD       And  . Melene Muller ON 08/01/2017] chlorhexidine (HIBICLENS) 4 % liquid 4 application  60 mL Topical Once Mashonda Broski, Kristen Loader, MD      . sodium chloride 0.9 % with cefUROXime (ZINACEF) ADS Med              PHYSICAL EXAM: There were no vitals filed for this visit.  GENERAL: The patient is a well-nourished female, in no acute distress. The vital signs are documented above. Right femoral vein catheter in place  MEDICAL ISSUES: Poorly functioning right femoral vein hemodialysis catheter.  Will replace in the operating room today.   Larina Earthly, MD FACS [facility] Tel [phone] Pager [phone]

## 2017-07-31 NOTE — Anesthesia Postprocedure Evaluation (Signed)
Anesthesia Post Note  Patient: ALIANAH LOFTON  Procedure(s) Performed: Removal  OF A  Right GroinTUNNELED  DIALYSIS CATHETER ,  Insertion of Left Femoral Dialysis Catheter. (Left Groin)     Patient location during evaluation: PACU Anesthesia Type: General Level of consciousness: awake and alert Pain management: pain level controlled Vital Signs Assessment: post-procedure vital signs reviewed and stable Respiratory status: spontaneous breathing and respiratory function stable Cardiovascular status: stable Postop Assessment: no apparent nausea or vomiting Anesthetic complications: no    Last Vitals:  Vitals:   07/31/17 1045 07/31/17 1100  BP: 130/65 131/64  Pulse: 81 85  Resp: 13 14  Temp:    SpO2: 96% 100%    Last Pain:  Vitals:   07/31/17 0757  TempSrc: Oral                 Roosevelt Bisher DANIEL

## 2017-07-31 NOTE — Telephone Encounter (Signed)
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

## 2017-07-31 NOTE — Transfer of Care (Signed)
Immediate Anesthesia Transfer of Care Note  Patient: Ashley [name]  Procedure(s) Performed: Removal  OF A  Right GroinTUNNELED  DIALYSIS CATHETER ,  Insertion of Left Femoral Dialysis Catheter. (Left Groin)  Patient Location: PACU  Anesthesia Type:MAC  Level of Consciousness: awake and alert   Airway & Oxygen Therapy: Patient Spontanous Breathing and Patient connected to nasal cannula oxygen  Post-op Assessment: Report given to RN and Post -op Vital signs reviewed and stable  Post vital signs: Reviewed and stable  Last Vitals:  Vitals:   07/31/17 0757 07/31/17 1015  BP: (!) 145/65   Pulse: 81 79  Resp: 18 (!) 29  Temp: 36.6 C (!) 36.1 C  SpO2: 100% 100%    Last Pain:  Vitals:   07/31/17 0757  TempSrc: Oral      Patients Stated Pain Goal: 0 (44/81/85 6314)  Complications: No apparent anesthesia complications

## 2017-08-01 ENCOUNTER — Encounter (HOSPITAL_COMMUNITY): Payer: Self-pay | Admitting: Vascular Surgery

## 2017-08-01 DIAGNOSIS — D509 Iron deficiency anemia, unspecified: Secondary | ICD-10-CM | POA: Diagnosis not present

## 2017-08-01 DIAGNOSIS — D631 Anemia in chronic kidney disease: Secondary | ICD-10-CM | POA: Diagnosis not present

## 2017-08-01 DIAGNOSIS — T8249XD Other complication of vascular dialysis catheter, subsequent encounter: Secondary | ICD-10-CM | POA: Diagnosis not present

## 2017-08-01 DIAGNOSIS — N186 End stage renal disease: Secondary | ICD-10-CM | POA: Diagnosis not present

## 2017-08-01 DIAGNOSIS — N2581 Secondary hyperparathyroidism of renal origin: Secondary | ICD-10-CM | POA: Diagnosis not present

## 2017-08-01 DIAGNOSIS — D689 Coagulation defect, unspecified: Secondary | ICD-10-CM | POA: Diagnosis not present

## 2017-08-01 DIAGNOSIS — Z992 Dependence on renal dialysis: Secondary | ICD-10-CM | POA: Diagnosis not present

## 2017-08-01 NOTE — Telephone Encounter (Signed)
Unfortunately, I can't give her anything for it without being seen. It's best for her to follow up with her surgeon to assess if anything is wrong with the surgical site given they won't prescribe anything over the phone.  Rory Percy, DO PGY-1, [facility] 08/01/2017 8:27 PM

## 2017-08-03 LAB — POCT I-STAT 4, (NA,K, GLUC, HGB,HCT)
GLUCOSE: 87 mg/dL (ref 65–99)
HEMATOCRIT: 28 % — AB (ref 36.0–46.0)
Hemoglobin: 9.5 g/dL — ABNORMAL LOW (ref 12.0–15.0)
Potassium: 7.6 mmol/L (ref 3.5–5.1)
SODIUM: 134 mmol/L — AB (ref 135–145)

## 2017-08-03 NOTE — Telephone Encounter (Signed)
Spoke with patient and she is unable to be seen in the office due to be at a rehab center.  Advised her to have the floor nurse working with her today contact her surgeon and update them on her pain level and how she is doing.  Informed her that they are responsible for her pain.  Patient voiced understanding. Oaklynn Stierwalt,CMA

## 2017-08-04 DIAGNOSIS — N2581 Secondary hyperparathyroidism of renal origin: Secondary | ICD-10-CM | POA: Diagnosis not present

## 2017-08-04 DIAGNOSIS — T8249XD Other complication of vascular dialysis catheter, subsequent encounter: Secondary | ICD-10-CM | POA: Diagnosis not present

## 2017-08-04 DIAGNOSIS — M2518 Fistula, other specified site: Secondary | ICD-10-CM | POA: Diagnosis not present

## 2017-08-04 DIAGNOSIS — D689 Coagulation defect, unspecified: Secondary | ICD-10-CM | POA: Diagnosis not present

## 2017-08-04 DIAGNOSIS — N186 End stage renal disease: Secondary | ICD-10-CM | POA: Diagnosis not present

## 2017-08-04 DIAGNOSIS — D631 Anemia in chronic kidney disease: Secondary | ICD-10-CM | POA: Diagnosis not present

## 2017-08-04 DIAGNOSIS — D509 Iron deficiency anemia, unspecified: Secondary | ICD-10-CM | POA: Diagnosis not present

## 2017-08-04 DIAGNOSIS — Z992 Dependence on renal dialysis: Secondary | ICD-10-CM | POA: Diagnosis not present

## 2017-08-05 ENCOUNTER — Encounter (HOSPITAL_COMMUNITY): Payer: Self-pay | Admitting: Vascular Surgery

## 2017-08-05 DIAGNOSIS — M2518 Fistula, other specified site: Secondary | ICD-10-CM | POA: Diagnosis not present

## 2017-08-05 NOTE — OR Nursing (Signed)
Entered a charge for Dialysis Catheter.

## 2017-08-06 DIAGNOSIS — T8249XD Other complication of vascular dialysis catheter, subsequent encounter: Secondary | ICD-10-CM | POA: Diagnosis not present

## 2017-08-06 DIAGNOSIS — Z992 Dependence on renal dialysis: Secondary | ICD-10-CM | POA: Diagnosis not present

## 2017-08-06 DIAGNOSIS — M2518 Fistula, other specified site: Secondary | ICD-10-CM | POA: Diagnosis not present

## 2017-08-06 DIAGNOSIS — D631 Anemia in chronic kidney disease: Secondary | ICD-10-CM | POA: Diagnosis not present

## 2017-08-06 DIAGNOSIS — N2581 Secondary hyperparathyroidism of renal origin: Secondary | ICD-10-CM | POA: Diagnosis not present

## 2017-08-06 DIAGNOSIS — N186 End stage renal disease: Secondary | ICD-10-CM | POA: Diagnosis not present

## 2017-08-06 DIAGNOSIS — D509 Iron deficiency anemia, unspecified: Secondary | ICD-10-CM | POA: Diagnosis not present

## 2017-08-06 DIAGNOSIS — D689 Coagulation defect, unspecified: Secondary | ICD-10-CM | POA: Diagnosis not present

## 2017-08-07 DIAGNOSIS — N186 End stage renal disease: Secondary | ICD-10-CM | POA: Diagnosis not present

## 2017-08-07 DIAGNOSIS — Z992 Dependence on renal dialysis: Secondary | ICD-10-CM | POA: Diagnosis not present

## 2017-08-07 DIAGNOSIS — N179 Acute kidney failure, unspecified: Secondary | ICD-10-CM | POA: Diagnosis not present

## 2017-08-07 DIAGNOSIS — M2518 Fistula, other specified site: Secondary | ICD-10-CM | POA: Diagnosis not present

## 2017-08-08 DIAGNOSIS — T8249XD Other complication of vascular dialysis catheter, subsequent encounter: Secondary | ICD-10-CM | POA: Diagnosis not present

## 2017-08-08 DIAGNOSIS — N2581 Secondary hyperparathyroidism of renal origin: Secondary | ICD-10-CM | POA: Diagnosis not present

## 2017-08-08 DIAGNOSIS — D509 Iron deficiency anemia, unspecified: Secondary | ICD-10-CM | POA: Diagnosis not present

## 2017-08-08 DIAGNOSIS — N186 End stage renal disease: Secondary | ICD-10-CM | POA: Diagnosis not present

## 2017-08-08 DIAGNOSIS — D631 Anemia in chronic kidney disease: Secondary | ICD-10-CM | POA: Diagnosis not present

## 2017-08-08 DIAGNOSIS — D689 Coagulation defect, unspecified: Secondary | ICD-10-CM | POA: Diagnosis not present

## 2017-08-08 DIAGNOSIS — Z23 Encounter for immunization: Secondary | ICD-10-CM | POA: Diagnosis not present

## 2017-08-08 DIAGNOSIS — Z992 Dependence on renal dialysis: Secondary | ICD-10-CM | POA: Diagnosis not present

## 2017-08-10 DIAGNOSIS — M2518 Fistula, other specified site: Secondary | ICD-10-CM | POA: Diagnosis not present

## 2017-08-11 DIAGNOSIS — Z992 Dependence on renal dialysis: Secondary | ICD-10-CM | POA: Diagnosis not present

## 2017-08-11 DIAGNOSIS — N186 End stage renal disease: Secondary | ICD-10-CM | POA: Diagnosis not present

## 2017-08-11 DIAGNOSIS — N2581 Secondary hyperparathyroidism of renal origin: Secondary | ICD-10-CM | POA: Diagnosis not present

## 2017-08-11 DIAGNOSIS — T8249XD Other complication of vascular dialysis catheter, subsequent encounter: Secondary | ICD-10-CM | POA: Diagnosis not present

## 2017-08-11 DIAGNOSIS — Z23 Encounter for immunization: Secondary | ICD-10-CM | POA: Diagnosis not present

## 2017-08-11 DIAGNOSIS — D689 Coagulation defect, unspecified: Secondary | ICD-10-CM | POA: Diagnosis not present

## 2017-08-11 DIAGNOSIS — M2518 Fistula, other specified site: Secondary | ICD-10-CM | POA: Diagnosis not present

## 2017-08-11 DIAGNOSIS — D631 Anemia in chronic kidney disease: Secondary | ICD-10-CM | POA: Diagnosis not present

## 2017-08-11 DIAGNOSIS — D509 Iron deficiency anemia, unspecified: Secondary | ICD-10-CM | POA: Diagnosis not present

## 2017-08-12 DIAGNOSIS — M2518 Fistula, other specified site: Secondary | ICD-10-CM | POA: Diagnosis not present

## 2017-08-13 DIAGNOSIS — N186 End stage renal disease: Secondary | ICD-10-CM | POA: Diagnosis not present

## 2017-08-13 DIAGNOSIS — T8249XD Other complication of vascular dialysis catheter, subsequent encounter: Secondary | ICD-10-CM | POA: Diagnosis not present

## 2017-08-13 DIAGNOSIS — D689 Coagulation defect, unspecified: Secondary | ICD-10-CM | POA: Diagnosis not present

## 2017-08-13 DIAGNOSIS — D631 Anemia in chronic kidney disease: Secondary | ICD-10-CM | POA: Diagnosis not present

## 2017-08-13 DIAGNOSIS — D509 Iron deficiency anemia, unspecified: Secondary | ICD-10-CM | POA: Diagnosis not present

## 2017-08-13 DIAGNOSIS — N2581 Secondary hyperparathyroidism of renal origin: Secondary | ICD-10-CM | POA: Diagnosis not present

## 2017-08-13 DIAGNOSIS — M2518 Fistula, other specified site: Secondary | ICD-10-CM | POA: Diagnosis not present

## 2017-08-13 DIAGNOSIS — Z23 Encounter for immunization: Secondary | ICD-10-CM | POA: Diagnosis not present

## 2017-08-13 DIAGNOSIS — Z992 Dependence on renal dialysis: Secondary | ICD-10-CM | POA: Diagnosis not present

## 2017-08-14 DIAGNOSIS — M2518 Fistula, other specified site: Secondary | ICD-10-CM | POA: Diagnosis not present

## 2017-08-15 DIAGNOSIS — Z23 Encounter for immunization: Secondary | ICD-10-CM | POA: Diagnosis not present

## 2017-08-15 DIAGNOSIS — T8249XD Other complication of vascular dialysis catheter, subsequent encounter: Secondary | ICD-10-CM | POA: Diagnosis not present

## 2017-08-15 DIAGNOSIS — Z992 Dependence on renal dialysis: Secondary | ICD-10-CM | POA: Diagnosis not present

## 2017-08-15 DIAGNOSIS — D689 Coagulation defect, unspecified: Secondary | ICD-10-CM | POA: Diagnosis not present

## 2017-08-15 DIAGNOSIS — N186 End stage renal disease: Secondary | ICD-10-CM | POA: Diagnosis not present

## 2017-08-15 DIAGNOSIS — D509 Iron deficiency anemia, unspecified: Secondary | ICD-10-CM | POA: Diagnosis not present

## 2017-08-15 DIAGNOSIS — N2581 Secondary hyperparathyroidism of renal origin: Secondary | ICD-10-CM | POA: Diagnosis not present

## 2017-08-15 DIAGNOSIS — D631 Anemia in chronic kidney disease: Secondary | ICD-10-CM | POA: Diagnosis not present

## 2017-08-17 DIAGNOSIS — M2518 Fistula, other specified site: Secondary | ICD-10-CM | POA: Diagnosis not present

## 2017-08-18 DIAGNOSIS — D509 Iron deficiency anemia, unspecified: Secondary | ICD-10-CM | POA: Diagnosis not present

## 2017-08-18 DIAGNOSIS — Z992 Dependence on renal dialysis: Secondary | ICD-10-CM | POA: Diagnosis not present

## 2017-08-18 DIAGNOSIS — D689 Coagulation defect, unspecified: Secondary | ICD-10-CM | POA: Diagnosis not present

## 2017-08-18 DIAGNOSIS — N186 End stage renal disease: Secondary | ICD-10-CM | POA: Diagnosis not present

## 2017-08-18 DIAGNOSIS — T8249XD Other complication of vascular dialysis catheter, subsequent encounter: Secondary | ICD-10-CM | POA: Diagnosis not present

## 2017-08-18 DIAGNOSIS — Z23 Encounter for immunization: Secondary | ICD-10-CM | POA: Diagnosis not present

## 2017-08-18 DIAGNOSIS — N2581 Secondary hyperparathyroidism of renal origin: Secondary | ICD-10-CM | POA: Diagnosis not present

## 2017-08-18 DIAGNOSIS — M2518 Fistula, other specified site: Secondary | ICD-10-CM | POA: Diagnosis not present

## 2017-08-18 DIAGNOSIS — D631 Anemia in chronic kidney disease: Secondary | ICD-10-CM | POA: Diagnosis not present

## 2017-08-19 DIAGNOSIS — M2518 Fistula, other specified site: Secondary | ICD-10-CM | POA: Diagnosis not present

## 2017-08-20 DIAGNOSIS — D689 Coagulation defect, unspecified: Secondary | ICD-10-CM | POA: Diagnosis not present

## 2017-08-20 DIAGNOSIS — Z23 Encounter for immunization: Secondary | ICD-10-CM | POA: Diagnosis not present

## 2017-08-20 DIAGNOSIS — D631 Anemia in chronic kidney disease: Secondary | ICD-10-CM | POA: Diagnosis not present

## 2017-08-20 DIAGNOSIS — M2518 Fistula, other specified site: Secondary | ICD-10-CM | POA: Diagnosis not present

## 2017-08-20 DIAGNOSIS — T8249XD Other complication of vascular dialysis catheter, subsequent encounter: Secondary | ICD-10-CM | POA: Diagnosis not present

## 2017-08-20 DIAGNOSIS — Z992 Dependence on renal dialysis: Secondary | ICD-10-CM | POA: Diagnosis not present

## 2017-08-20 DIAGNOSIS — N186 End stage renal disease: Secondary | ICD-10-CM | POA: Diagnosis not present

## 2017-08-20 DIAGNOSIS — N2581 Secondary hyperparathyroidism of renal origin: Secondary | ICD-10-CM | POA: Diagnosis not present

## 2017-08-20 DIAGNOSIS — D509 Iron deficiency anemia, unspecified: Secondary | ICD-10-CM | POA: Diagnosis not present

## 2017-08-21 DIAGNOSIS — M2518 Fistula, other specified site: Secondary | ICD-10-CM | POA: Diagnosis not present

## 2017-08-22 DIAGNOSIS — T8249XD Other complication of vascular dialysis catheter, subsequent encounter: Secondary | ICD-10-CM | POA: Diagnosis not present

## 2017-08-22 DIAGNOSIS — D631 Anemia in chronic kidney disease: Secondary | ICD-10-CM | POA: Diagnosis not present

## 2017-08-22 DIAGNOSIS — N2581 Secondary hyperparathyroidism of renal origin: Secondary | ICD-10-CM | POA: Diagnosis not present

## 2017-08-22 DIAGNOSIS — Z992 Dependence on renal dialysis: Secondary | ICD-10-CM | POA: Diagnosis not present

## 2017-08-22 DIAGNOSIS — D689 Coagulation defect, unspecified: Secondary | ICD-10-CM | POA: Diagnosis not present

## 2017-08-22 DIAGNOSIS — Z23 Encounter for immunization: Secondary | ICD-10-CM | POA: Diagnosis not present

## 2017-08-22 DIAGNOSIS — D509 Iron deficiency anemia, unspecified: Secondary | ICD-10-CM | POA: Diagnosis not present

## 2017-08-22 DIAGNOSIS — N186 End stage renal disease: Secondary | ICD-10-CM | POA: Diagnosis not present

## 2017-08-25 DIAGNOSIS — Z23 Encounter for immunization: Secondary | ICD-10-CM | POA: Diagnosis not present

## 2017-08-25 DIAGNOSIS — Z992 Dependence on renal dialysis: Secondary | ICD-10-CM | POA: Diagnosis not present

## 2017-08-25 DIAGNOSIS — D631 Anemia in chronic kidney disease: Secondary | ICD-10-CM | POA: Diagnosis not present

## 2017-08-25 DIAGNOSIS — N2581 Secondary hyperparathyroidism of renal origin: Secondary | ICD-10-CM | POA: Diagnosis not present

## 2017-08-25 DIAGNOSIS — T8249XD Other complication of vascular dialysis catheter, subsequent encounter: Secondary | ICD-10-CM | POA: Diagnosis not present

## 2017-08-25 DIAGNOSIS — D509 Iron deficiency anemia, unspecified: Secondary | ICD-10-CM | POA: Diagnosis not present

## 2017-08-25 DIAGNOSIS — I10 Essential (primary) hypertension: Secondary | ICD-10-CM | POA: Diagnosis not present

## 2017-08-25 DIAGNOSIS — D689 Coagulation defect, unspecified: Secondary | ICD-10-CM | POA: Diagnosis not present

## 2017-08-25 DIAGNOSIS — N186 End stage renal disease: Secondary | ICD-10-CM | POA: Diagnosis not present

## 2017-08-25 DIAGNOSIS — D649 Anemia, unspecified: Secondary | ICD-10-CM | POA: Diagnosis not present

## 2017-08-25 DIAGNOSIS — I639 Cerebral infarction, unspecified: Secondary | ICD-10-CM | POA: Diagnosis not present

## 2017-08-27 DIAGNOSIS — N2581 Secondary hyperparathyroidism of renal origin: Secondary | ICD-10-CM | POA: Diagnosis not present

## 2017-08-27 DIAGNOSIS — D631 Anemia in chronic kidney disease: Secondary | ICD-10-CM | POA: Diagnosis not present

## 2017-08-27 DIAGNOSIS — T8249XD Other complication of vascular dialysis catheter, subsequent encounter: Secondary | ICD-10-CM | POA: Diagnosis not present

## 2017-08-27 DIAGNOSIS — D689 Coagulation defect, unspecified: Secondary | ICD-10-CM | POA: Diagnosis not present

## 2017-08-27 DIAGNOSIS — Z23 Encounter for immunization: Secondary | ICD-10-CM | POA: Diagnosis not present

## 2017-08-27 DIAGNOSIS — D509 Iron deficiency anemia, unspecified: Secondary | ICD-10-CM | POA: Diagnosis not present

## 2017-08-27 DIAGNOSIS — Z992 Dependence on renal dialysis: Secondary | ICD-10-CM | POA: Diagnosis not present

## 2017-08-27 DIAGNOSIS — N186 End stage renal disease: Secondary | ICD-10-CM | POA: Diagnosis not present

## 2017-08-29 DIAGNOSIS — D689 Coagulation defect, unspecified: Secondary | ICD-10-CM | POA: Diagnosis not present

## 2017-08-29 DIAGNOSIS — N2581 Secondary hyperparathyroidism of renal origin: Secondary | ICD-10-CM | POA: Diagnosis not present

## 2017-08-29 DIAGNOSIS — N186 End stage renal disease: Secondary | ICD-10-CM | POA: Diagnosis not present

## 2017-08-29 DIAGNOSIS — T8249XD Other complication of vascular dialysis catheter, subsequent encounter: Secondary | ICD-10-CM | POA: Diagnosis not present

## 2017-08-29 DIAGNOSIS — D509 Iron deficiency anemia, unspecified: Secondary | ICD-10-CM | POA: Diagnosis not present

## 2017-08-29 DIAGNOSIS — Z992 Dependence on renal dialysis: Secondary | ICD-10-CM | POA: Diagnosis not present

## 2017-08-29 DIAGNOSIS — Z23 Encounter for immunization: Secondary | ICD-10-CM | POA: Diagnosis not present

## 2017-08-29 DIAGNOSIS — D631 Anemia in chronic kidney disease: Secondary | ICD-10-CM | POA: Diagnosis not present

## 2017-09-01 DIAGNOSIS — T8249XD Other complication of vascular dialysis catheter, subsequent encounter: Secondary | ICD-10-CM | POA: Diagnosis not present

## 2017-09-01 DIAGNOSIS — N2581 Secondary hyperparathyroidism of renal origin: Secondary | ICD-10-CM | POA: Diagnosis not present

## 2017-09-01 DIAGNOSIS — D689 Coagulation defect, unspecified: Secondary | ICD-10-CM | POA: Diagnosis not present

## 2017-09-01 DIAGNOSIS — Z992 Dependence on renal dialysis: Secondary | ICD-10-CM | POA: Diagnosis not present

## 2017-09-01 DIAGNOSIS — D509 Iron deficiency anemia, unspecified: Secondary | ICD-10-CM | POA: Diagnosis not present

## 2017-09-01 DIAGNOSIS — D631 Anemia in chronic kidney disease: Secondary | ICD-10-CM | POA: Diagnosis not present

## 2017-09-01 DIAGNOSIS — Z23 Encounter for immunization: Secondary | ICD-10-CM | POA: Diagnosis not present

## 2017-09-01 DIAGNOSIS — N186 End stage renal disease: Secondary | ICD-10-CM | POA: Diagnosis not present

## 2017-09-03 DIAGNOSIS — N2581 Secondary hyperparathyroidism of renal origin: Secondary | ICD-10-CM | POA: Diagnosis not present

## 2017-09-03 DIAGNOSIS — Z23 Encounter for immunization: Secondary | ICD-10-CM | POA: Diagnosis not present

## 2017-09-03 DIAGNOSIS — T8249XD Other complication of vascular dialysis catheter, subsequent encounter: Secondary | ICD-10-CM | POA: Diagnosis not present

## 2017-09-03 DIAGNOSIS — Z992 Dependence on renal dialysis: Secondary | ICD-10-CM | POA: Diagnosis not present

## 2017-09-03 DIAGNOSIS — D689 Coagulation defect, unspecified: Secondary | ICD-10-CM | POA: Diagnosis not present

## 2017-09-03 DIAGNOSIS — D509 Iron deficiency anemia, unspecified: Secondary | ICD-10-CM | POA: Diagnosis not present

## 2017-09-03 DIAGNOSIS — N186 End stage renal disease: Secondary | ICD-10-CM | POA: Diagnosis not present

## 2017-09-03 DIAGNOSIS — D631 Anemia in chronic kidney disease: Secondary | ICD-10-CM | POA: Diagnosis not present

## 2017-09-05 DIAGNOSIS — Z23 Encounter for immunization: Secondary | ICD-10-CM | POA: Diagnosis not present

## 2017-09-05 DIAGNOSIS — D509 Iron deficiency anemia, unspecified: Secondary | ICD-10-CM | POA: Diagnosis not present

## 2017-09-05 DIAGNOSIS — D689 Coagulation defect, unspecified: Secondary | ICD-10-CM | POA: Diagnosis not present

## 2017-09-05 DIAGNOSIS — N186 End stage renal disease: Secondary | ICD-10-CM | POA: Diagnosis not present

## 2017-09-05 DIAGNOSIS — N2581 Secondary hyperparathyroidism of renal origin: Secondary | ICD-10-CM | POA: Diagnosis not present

## 2017-09-05 DIAGNOSIS — Z992 Dependence on renal dialysis: Secondary | ICD-10-CM | POA: Diagnosis not present

## 2017-09-05 DIAGNOSIS — D631 Anemia in chronic kidney disease: Secondary | ICD-10-CM | POA: Diagnosis not present

## 2017-09-05 DIAGNOSIS — T8249XD Other complication of vascular dialysis catheter, subsequent encounter: Secondary | ICD-10-CM | POA: Diagnosis not present

## 2017-09-07 DIAGNOSIS — N186 End stage renal disease: Secondary | ICD-10-CM | POA: Diagnosis not present

## 2017-09-07 DIAGNOSIS — N179 Acute kidney failure, unspecified: Secondary | ICD-10-CM | POA: Diagnosis not present

## 2017-09-07 DIAGNOSIS — Z992 Dependence on renal dialysis: Secondary | ICD-10-CM | POA: Diagnosis not present

## 2017-09-08 DIAGNOSIS — T8249XD Other complication of vascular dialysis catheter, subsequent encounter: Secondary | ICD-10-CM | POA: Diagnosis not present

## 2017-09-08 DIAGNOSIS — D689 Coagulation defect, unspecified: Secondary | ICD-10-CM | POA: Diagnosis not present

## 2017-09-08 DIAGNOSIS — N186 End stage renal disease: Secondary | ICD-10-CM | POA: Diagnosis not present

## 2017-09-08 DIAGNOSIS — N2581 Secondary hyperparathyroidism of renal origin: Secondary | ICD-10-CM | POA: Diagnosis not present

## 2017-09-08 DIAGNOSIS — D631 Anemia in chronic kidney disease: Secondary | ICD-10-CM | POA: Diagnosis not present

## 2017-09-08 DIAGNOSIS — D509 Iron deficiency anemia, unspecified: Secondary | ICD-10-CM | POA: Diagnosis not present

## 2017-09-10 DIAGNOSIS — D509 Iron deficiency anemia, unspecified: Secondary | ICD-10-CM | POA: Diagnosis not present

## 2017-09-10 DIAGNOSIS — D689 Coagulation defect, unspecified: Secondary | ICD-10-CM | POA: Diagnosis not present

## 2017-09-10 DIAGNOSIS — T8249XD Other complication of vascular dialysis catheter, subsequent encounter: Secondary | ICD-10-CM | POA: Diagnosis not present

## 2017-09-10 DIAGNOSIS — N186 End stage renal disease: Secondary | ICD-10-CM | POA: Diagnosis not present

## 2017-09-10 DIAGNOSIS — D631 Anemia in chronic kidney disease: Secondary | ICD-10-CM | POA: Diagnosis not present

## 2017-09-10 DIAGNOSIS — N2581 Secondary hyperparathyroidism of renal origin: Secondary | ICD-10-CM | POA: Diagnosis not present

## 2017-09-12 DIAGNOSIS — T8249XD Other complication of vascular dialysis catheter, subsequent encounter: Secondary | ICD-10-CM | POA: Diagnosis not present

## 2017-09-12 DIAGNOSIS — D509 Iron deficiency anemia, unspecified: Secondary | ICD-10-CM | POA: Diagnosis not present

## 2017-09-12 DIAGNOSIS — N2581 Secondary hyperparathyroidism of renal origin: Secondary | ICD-10-CM | POA: Diagnosis not present

## 2017-09-12 DIAGNOSIS — D631 Anemia in chronic kidney disease: Secondary | ICD-10-CM | POA: Diagnosis not present

## 2017-09-12 DIAGNOSIS — D689 Coagulation defect, unspecified: Secondary | ICD-10-CM | POA: Diagnosis not present

## 2017-09-12 DIAGNOSIS — N186 End stage renal disease: Secondary | ICD-10-CM | POA: Diagnosis not present

## 2017-09-15 DIAGNOSIS — D509 Iron deficiency anemia, unspecified: Secondary | ICD-10-CM | POA: Diagnosis not present

## 2017-09-15 DIAGNOSIS — T8249XD Other complication of vascular dialysis catheter, subsequent encounter: Secondary | ICD-10-CM | POA: Diagnosis not present

## 2017-09-15 DIAGNOSIS — D689 Coagulation defect, unspecified: Secondary | ICD-10-CM | POA: Diagnosis not present

## 2017-09-15 DIAGNOSIS — N186 End stage renal disease: Secondary | ICD-10-CM | POA: Diagnosis not present

## 2017-09-15 DIAGNOSIS — D631 Anemia in chronic kidney disease: Secondary | ICD-10-CM | POA: Diagnosis not present

## 2017-09-15 DIAGNOSIS — N2581 Secondary hyperparathyroidism of renal origin: Secondary | ICD-10-CM | POA: Diagnosis not present

## 2017-09-18 DIAGNOSIS — N2581 Secondary hyperparathyroidism of renal origin: Secondary | ICD-10-CM | POA: Diagnosis not present

## 2017-09-18 DIAGNOSIS — D689 Coagulation defect, unspecified: Secondary | ICD-10-CM | POA: Diagnosis not present

## 2017-09-18 DIAGNOSIS — T8249XD Other complication of vascular dialysis catheter, subsequent encounter: Secondary | ICD-10-CM | POA: Diagnosis not present

## 2017-09-18 DIAGNOSIS — D631 Anemia in chronic kidney disease: Secondary | ICD-10-CM | POA: Diagnosis not present

## 2017-09-18 DIAGNOSIS — D509 Iron deficiency anemia, unspecified: Secondary | ICD-10-CM | POA: Diagnosis not present

## 2017-09-18 DIAGNOSIS — N186 End stage renal disease: Secondary | ICD-10-CM | POA: Diagnosis not present

## 2017-09-19 DIAGNOSIS — T8249XD Other complication of vascular dialysis catheter, subsequent encounter: Secondary | ICD-10-CM | POA: Diagnosis not present

## 2017-09-19 DIAGNOSIS — N2581 Secondary hyperparathyroidism of renal origin: Secondary | ICD-10-CM | POA: Diagnosis not present

## 2017-09-19 DIAGNOSIS — D689 Coagulation defect, unspecified: Secondary | ICD-10-CM | POA: Diagnosis not present

## 2017-09-19 DIAGNOSIS — D509 Iron deficiency anemia, unspecified: Secondary | ICD-10-CM | POA: Diagnosis not present

## 2017-09-19 DIAGNOSIS — N186 End stage renal disease: Secondary | ICD-10-CM | POA: Diagnosis not present

## 2017-09-19 DIAGNOSIS — D631 Anemia in chronic kidney disease: Secondary | ICD-10-CM | POA: Diagnosis not present

## 2017-09-22 DIAGNOSIS — D689 Coagulation defect, unspecified: Secondary | ICD-10-CM | POA: Diagnosis not present

## 2017-09-22 DIAGNOSIS — I10 Essential (primary) hypertension: Secondary | ICD-10-CM | POA: Diagnosis not present

## 2017-09-22 DIAGNOSIS — N186 End stage renal disease: Secondary | ICD-10-CM | POA: Diagnosis not present

## 2017-09-22 DIAGNOSIS — D631 Anemia in chronic kidney disease: Secondary | ICD-10-CM | POA: Diagnosis not present

## 2017-09-22 DIAGNOSIS — T8249XD Other complication of vascular dialysis catheter, subsequent encounter: Secondary | ICD-10-CM | POA: Diagnosis not present

## 2017-09-22 DIAGNOSIS — N2581 Secondary hyperparathyroidism of renal origin: Secondary | ICD-10-CM | POA: Diagnosis not present

## 2017-09-22 DIAGNOSIS — D509 Iron deficiency anemia, unspecified: Secondary | ICD-10-CM | POA: Diagnosis not present

## 2017-09-22 DIAGNOSIS — I639 Cerebral infarction, unspecified: Secondary | ICD-10-CM | POA: Diagnosis not present

## 2017-09-24 ENCOUNTER — Encounter: Payer: Self-pay | Admitting: Surgery

## 2017-09-24 ENCOUNTER — Other Ambulatory Visit: Payer: Self-pay | Admitting: *Deleted

## 2017-09-24 ENCOUNTER — Other Ambulatory Visit: Payer: Self-pay

## 2017-09-24 ENCOUNTER — Ambulatory Visit (INDEPENDENT_AMBULATORY_CARE_PROVIDER_SITE_OTHER): Payer: Medicare Other | Admitting: Surgery

## 2017-09-24 ENCOUNTER — Encounter: Payer: Self-pay | Admitting: *Deleted

## 2017-09-24 VITALS — BP 146/80 | HR 75 | Temp 98.7°F | Resp 14 | Ht 62.0 in | Wt 127.0 lb

## 2017-09-24 DIAGNOSIS — D631 Anemia in chronic kidney disease: Secondary | ICD-10-CM | POA: Diagnosis not present

## 2017-09-24 DIAGNOSIS — D689 Coagulation defect, unspecified: Secondary | ICD-10-CM | POA: Diagnosis not present

## 2017-09-24 DIAGNOSIS — D509 Iron deficiency anemia, unspecified: Secondary | ICD-10-CM | POA: Diagnosis not present

## 2017-09-24 DIAGNOSIS — N2581 Secondary hyperparathyroidism of renal origin: Secondary | ICD-10-CM | POA: Diagnosis not present

## 2017-09-24 DIAGNOSIS — N186 End stage renal disease: Secondary | ICD-10-CM | POA: Diagnosis not present

## 2017-09-24 DIAGNOSIS — Z992 Dependence on renal dialysis: Secondary | ICD-10-CM

## 2017-09-24 DIAGNOSIS — T8249XD Other complication of vascular dialysis catheter, subsequent encounter: Secondary | ICD-10-CM | POA: Diagnosis not present

## 2017-09-24 NOTE — Progress Notes (Signed)
[facility] [place]  Patient name: Ashley [name] MRN: [id number] DOB: 04-10-1951 Sex: female   REASON FOR VISIT:   Follow up  [place]:    Ashley [name] is a 67 y.o. female who returns today for follow-up.  She is status post left first stage basilic vein fistula creation on 06/16/2017 by Dr. Bridgett Larsson.  She subsequently had a catheter changed on February 22.  She is on dialysis Tuesday Thursday Saturday.  She has not been evaluated for a second stage procedure.   PAST MEDICAL HISTORY:   Past Medical History:  Diagnosis Date  . Acute kidney injury ([place]) 05/24/2013  . Arthritis   . Cervical radiculopathy 02/28/2011  . Chronic renal failure   . Cocaine substance abuse ([place]) 05/26/2013   positive UDS   . Gastropathy   . GERD (gastroesophageal reflux disease)   . Hepatitis C 1987   history of IVDA  . Hiatal hernia   . Hyperlipidemia   . Hypertension   . Marijuana abuse 12/18/204   positive UDS, family members smoke as well  . Pancreatitis 2000   resolved  . 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 Schatzki's ring   . Stroke ([place])   . Ulcer 1990   gastric ulcer. Ruptured s/p emergency repair     FAMILY HISTORY:   Family History  Problem Relation Age of Onset  . Hypertension Father   . Cancer Father   . Hyperlipidemia Father   . Seizures Sister   . Early death Daughter   . Kidney disease Daughter        end stage dialysis dependent     SOCIAL HISTORY:   Social History   Tobacco Use  . Smoking status: Former Smoker    Packs/day: 0.25    Years: 40.00    Pack years: 10.00    Types: Cigarettes  . Smokeless tobacco: Never Used  . Tobacco comment: wears a patch  Substance Use Topics  . Alcohol use: No    Alcohol/week: 0.0 oz     ALLERGIES:   Allergies  Allergen Reactions  . Aspirin Other (See Comments)    Stomach ache  . Ibuprofen Other (See Comments)    Stomach ache      CURRENT MEDICATIONS:   Current Outpatient Medications  Medication Sig Dispense Refill  . acetaminophen (TYLENOL) 325 MG tablet Take 2 tablets (650 mg total) by mouth every 6 (six) hours as needed (pain). (Patient taking differently: Take 650 mg by mouth every 6 (six) hours as needed for moderate pain or headache. ) 100 tablet 0  . amLODipine (NORVASC) 10 MG tablet Take 1 tablet (10 mg total) by mouth daily. 90 tablet 1  . atorvastatin (LIPITOR) 40 MG tablet Take 1 tablet (40 mg total) by mouth daily at 6 PM. 30 tablet 0  . calcitRIOL (ROCALTROL) 0.5 MCG capsule Take 1 capsule (0.5 mcg total) by mouth Every Tuesday,Thursday,and Saturday with dialysis. 30 capsule 0  . calcium acetate (PHOSLO) 667 MG capsule Take 1 capsule (667 mg total) by mouth 3 (three) times daily with meals. 30 capsule 0  . feeding supplement (BOOST / RESOURCE BREEZE) LIQD Take 1 Container by mouth 3 (three) times daily between meals. 237 mL 6  . hydrALAZINE (APRESOLINE) 25 MG tablet Take 1 tablet (25 mg total) by mouth every 8 (eight) hours. 90 tablet 0  . nicotine (NICODERM CQ - DOSED IN MG/24 HOURS) 14 mg/24hr patch Place 1 patch (14 mg  total) onto the skin daily. 28 patch 0  . Nutritional Supplements (FEEDING SUPPLEMENT, NEPRO CARB STEADY,) LIQD Take by mouth.    . ondansetron (ZOFRAN ODT) 8 MG disintegrating tablet Take 1 tablet (8 mg total) by mouth every 8 (eight) hours as needed for nausea or vomiting. 20 tablet 0  . pantoprazole (PROTONIX) 40 MG tablet Take 1 tablet (40 mg total) by mouth daily. 30 tablet 0  . polyethylene glycol (MIRALAX / GLYCOLAX) packet Take 17 g by mouth daily as needed for mild constipation. 14 each 0  . senna-docusate (SENOKOT-S) 8.6-50 MG tablet Take 1 tablet by mouth at bedtime as needed for mild constipation. 30 tablet 0  . traMADol (ULTRAM) 50 MG tablet Take 1 tablet (50 mg total) by mouth every 6 (six) hours as needed for moderate pain. 30 tablet 0   No current facility-administered  medications for this visit.     REVIEW OF SYSTEMS:   [X]  denotes positive finding, [ ]  denotes negative finding Cardiac  Comments:  Chest pain or chest pressure:    Shortness of breath upon exertion:    Short of breath when lying flat:    Irregular heart rhythm:        Vascular    Pain in calf, thigh, or hip brought on by ambulation:    Pain in feet at night that wakes you up from your sleep:     Blood clot in your veins:    Leg swelling:         Pulmonary    Oxygen at home:    Productive cough:     Wheezing:         Neurologic    Sudden weakness in arms or legs:     Sudden numbness in arms or legs:     Sudden onset of difficulty speaking or slurred speech:    Temporary loss of vision in one eye:     Problems with dizziness:         Gastrointestinal    Blood in stool:     Vomited blood:         Genitourinary    Burning when urinating:     Blood in urine:        Psychiatric    Major depression:         Hematologic    Bleeding problems:    Problems with blood clotting too easily:        Skin    Rashes or ulcers:        Constitutional    Fever or chills:      PHYSICAL EXAM:   There were no vitals filed for this visit.  GENERAL: The patient is a well-nourished female, in no acute distress. The vital signs are documented above. CARDIAC: There is a regular rate and rhythm.  VASCULAR: Palpable thrill within the fistula.  All incisions have healed. PULMONARY: Non-labored respirations MUSCULOSKELETAL: There are no major deformities or cyanosis. NEUROLOGIC: No focal weakness or paresthesias are detected. SKIN: There are no ulcers or rashes noted. PSYCHIATRIC: The patient has a normal affect.  STUDIES:   I evaluated the basilic vein with sono site.  It appears to be a generous vein, greater than 7 mm.  There is some narrowing of the upper arm but the vein maintains a greater than 5 mm lumen.  MEDICAL ISSUES:   Patient will be scheduled for a second stage  basilic vein fistula by Dr. Bridgett Larsson next week on a nondialysis day.  The risks and benefits of the procedure were discussed with the patient.  All questions answered.    Annamarie Major, MD [facility] [phone] Pager [phone]

## 2017-09-24 NOTE — Progress Notes (Signed)
Call to [facility] @ [facility] [place]. Instructed to have patient at [facility] admitting department at 8 am on 10/02/16 for surgery. They will arrange transportation. Reminded BODE to read letter of instructions sent back to facility with patient this am.

## 2017-09-24 NOTE — Progress Notes (Signed)
Vitals:   09/24/17 0921  BP: (!) 147/80  Pulse: 75  Resp: 14  Temp: 98.7 F (37.1 C)  TempSrc: Oral  SpO2: 100%  Weight: 127 lb (57.6 kg)  Height: 5\' 2"  (1.575 m)

## 2017-09-24 NOTE — H&P (View-Only) (Signed)
[facility] [place]  Patient name: Ashley [name] MRN: [id number] DOB: 08-30-1950 Sex: female   REASON FOR VISIT:   Follow up  [place]:    Ashley [name] is a 67 y.o. female who returns today for follow-up.  She is status post left first stage basilic vein fistula creation on 06/16/2017 by Dr. Bridgett Larsson.  She subsequently had a catheter changed on February 22.  She is on dialysis Tuesday Thursday Saturday.  She has not been evaluated for a second stage procedure.   PAST MEDICAL HISTORY:   Past Medical History:  Diagnosis Date  . Acute kidney injury ([place]) 05/24/2013  . Arthritis   . Cervical radiculopathy 02/28/2011  . Chronic renal failure   . Cocaine substance abuse ([place]) 05/26/2013   positive UDS   . Gastropathy   . GERD (gastroesophageal reflux disease)   . Hepatitis C 1987   history of IVDA  . Hiatal hernia   . Hyperlipidemia   . Hypertension   . Marijuana abuse 12/18/204   positive UDS, family members smoke as well  . Pancreatitis 2000   resolved  . 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 Schatzki's ring   . Stroke ([place])   . Ulcer 1990   gastric ulcer. Ruptured s/p emergency repair     FAMILY HISTORY:   Family History  Problem Relation Age of Onset  . Hypertension Father   . Cancer Father   . Hyperlipidemia Father   . Seizures Sister   . Early death Daughter   . Kidney disease Daughter        end stage dialysis dependent     SOCIAL HISTORY:   Social History   Tobacco Use  . Smoking status: Former Smoker    Packs/day: 0.25    Years: 40.00    Pack years: 10.00    Types: Cigarettes  . Smokeless tobacco: Never Used  . Tobacco comment: wears a patch  Substance Use Topics  . Alcohol use: No    Alcohol/week: 0.0 oz     ALLERGIES:   Allergies  Allergen Reactions  . Aspirin Other (See Comments)    Stomach ache  . Ibuprofen Other (See Comments)    Stomach ache      CURRENT MEDICATIONS:   Current Outpatient Medications  Medication Sig Dispense Refill  . acetaminophen (TYLENOL) 325 MG tablet Take 2 tablets (650 mg total) by mouth every 6 (six) hours as needed (pain). (Patient taking differently: Take 650 mg by mouth every 6 (six) hours as needed for moderate pain or headache. ) 100 tablet 0  . amLODipine (NORVASC) 10 MG tablet Take 1 tablet (10 mg total) by mouth daily. 90 tablet 1  . atorvastatin (LIPITOR) 40 MG tablet Take 1 tablet (40 mg total) by mouth daily at 6 PM. 30 tablet 0  . calcitRIOL (ROCALTROL) 0.5 MCG capsule Take 1 capsule (0.5 mcg total) by mouth Every Tuesday,Thursday,and Saturday with dialysis. 30 capsule 0  . calcium acetate (PHOSLO) 667 MG capsule Take 1 capsule (667 mg total) by mouth 3 (three) times daily with meals. 30 capsule 0  . feeding supplement (BOOST / RESOURCE BREEZE) LIQD Take 1 Container by mouth 3 (three) times daily between meals. 237 mL 6  . hydrALAZINE (APRESOLINE) 25 MG tablet Take 1 tablet (25 mg total) by mouth every 8 (eight) hours. 90 tablet 0  . nicotine (NICODERM CQ - DOSED IN MG/24 HOURS) 14 mg/24hr patch Place 1 patch (14 mg  total) onto the skin daily. 28 patch 0  . Nutritional Supplements (FEEDING SUPPLEMENT, NEPRO CARB STEADY,) LIQD Take by mouth.    . ondansetron (ZOFRAN ODT) 8 MG disintegrating tablet Take 1 tablet (8 mg total) by mouth every 8 (eight) hours as needed for nausea or vomiting. 20 tablet 0  . pantoprazole (PROTONIX) 40 MG tablet Take 1 tablet (40 mg total) by mouth daily. 30 tablet 0  . polyethylene glycol (MIRALAX / GLYCOLAX) packet Take 17 g by mouth daily as needed for mild constipation. 14 each 0  . senna-docusate (SENOKOT-S) 8.6-50 MG tablet Take 1 tablet by mouth at bedtime as needed for mild constipation. 30 tablet 0  . traMADol (ULTRAM) 50 MG tablet Take 1 tablet (50 mg total) by mouth every 6 (six) hours as needed for moderate pain. 30 tablet 0   No current facility-administered  medications for this visit.     REVIEW OF SYSTEMS:   [X]  denotes positive finding, [ ]  denotes negative finding Cardiac  Comments:  Chest pain or chest pressure:    Shortness of breath upon exertion:    Short of breath when lying flat:    Irregular heart rhythm:        Vascular    Pain in calf, thigh, or hip brought on by ambulation:    Pain in feet at night that wakes you up from your sleep:     Blood clot in your veins:    Leg swelling:         Pulmonary    Oxygen at home:    Productive cough:     Wheezing:         Neurologic    Sudden weakness in arms or legs:     Sudden numbness in arms or legs:     Sudden onset of difficulty speaking or slurred speech:    Temporary loss of vision in one eye:     Problems with dizziness:         Gastrointestinal    Blood in stool:     Vomited blood:         Genitourinary    Burning when urinating:     Blood in urine:        Psychiatric    Major depression:         Hematologic    Bleeding problems:    Problems with blood clotting too easily:        Skin    Rashes or ulcers:        Constitutional    Fever or chills:      PHYSICAL EXAM:   There were no vitals filed for this visit.  GENERAL: The patient is a well-nourished female, in no acute distress. The vital signs are documented above. CARDIAC: There is a regular rate and rhythm.  VASCULAR: Palpable thrill within the fistula.  All incisions have healed. PULMONARY: Non-labored respirations MUSCULOSKELETAL: There are no major deformities or cyanosis. NEUROLOGIC: No focal weakness or paresthesias are detected. SKIN: There are no ulcers or rashes noted. PSYCHIATRIC: The patient has a normal affect.  STUDIES:   I evaluated the basilic vein with sono site.  It appears to be a generous vein, greater than 7 mm.  There is some narrowing of the upper arm but the vein maintains a greater than 5 mm lumen.  MEDICAL ISSUES:   Patient will be scheduled for a second stage  basilic vein fistula by Dr. Bridgett Larsson next week on a nondialysis day.  The risks and benefits of the procedure were discussed with the patient.  All questions answered.    Annamarie Major, MD [facility] [phone] Pager [phone]

## 2017-09-26 DIAGNOSIS — D631 Anemia in chronic kidney disease: Secondary | ICD-10-CM | POA: Diagnosis not present

## 2017-09-26 DIAGNOSIS — D509 Iron deficiency anemia, unspecified: Secondary | ICD-10-CM | POA: Diagnosis not present

## 2017-09-26 DIAGNOSIS — T8249XD Other complication of vascular dialysis catheter, subsequent encounter: Secondary | ICD-10-CM | POA: Diagnosis not present

## 2017-09-26 DIAGNOSIS — N2581 Secondary hyperparathyroidism of renal origin: Secondary | ICD-10-CM | POA: Diagnosis not present

## 2017-09-26 DIAGNOSIS — D689 Coagulation defect, unspecified: Secondary | ICD-10-CM | POA: Diagnosis not present

## 2017-09-26 DIAGNOSIS — N186 End stage renal disease: Secondary | ICD-10-CM | POA: Diagnosis not present

## 2017-09-29 DIAGNOSIS — D689 Coagulation defect, unspecified: Secondary | ICD-10-CM | POA: Diagnosis not present

## 2017-09-29 DIAGNOSIS — D509 Iron deficiency anemia, unspecified: Secondary | ICD-10-CM | POA: Diagnosis not present

## 2017-09-29 DIAGNOSIS — D631 Anemia in chronic kidney disease: Secondary | ICD-10-CM | POA: Diagnosis not present

## 2017-09-29 DIAGNOSIS — T8249XD Other complication of vascular dialysis catheter, subsequent encounter: Secondary | ICD-10-CM | POA: Diagnosis not present

## 2017-09-29 DIAGNOSIS — N186 End stage renal disease: Secondary | ICD-10-CM | POA: Diagnosis not present

## 2017-09-29 DIAGNOSIS — N2581 Secondary hyperparathyroidism of renal origin: Secondary | ICD-10-CM | POA: Diagnosis not present

## 2017-09-30 ENCOUNTER — Ambulatory Visit: Payer: Medicare Other | Admitting: Neurology

## 2017-09-30 NOTE — Pre-Procedure Instructions (Signed)
    KIERAH GOATLEY  09/30/2017        Your procedure is scheduled on Friday, October 02, 2017  Report to [facility] Admitting at 8:00 A.M.  Call this number if you have problems the morning of surgery:  [phone]   Remember:  Do not eat food or drink liquids after midnight Thursday, October 01, 2017  Take these medicines the morning of surgery with A SIP OF WATER : amLODipine (NORVASC),  hydrALAZINE (APRESOLINE), pantoprazole (PROTONIX), if needed: pain medication, ondansetron (ZOFRAN ODT) for nausea or vomiting.  Stop taking vitamins, fish oil and herbal medications. Do not take any NSAIDs ie: Ibuprofen, Advil, Naproxen (Aleve), Motrin, BC and Goody Powder; stop now.  Do not wear jewelry, make-up or nail polish.  Do not wear lotions, powders, or perfumes, or deodorant.  Do not shave 48 hours prior to surgery.    Do not bring valuables to the hospital.  [facility] is not responsible for any belongings or valuables.  Contacts, dentures or bridgework may not be worn into surgery. For patients admitted to the hospital, discharge time will be determined by your treatment team.  Patients discharged the day of surgery will not be allowed to drive home.  Please read over the following fact sheets that you were given.

## 2017-09-30 NOTE — Progress Notes (Signed)
Pt nurse, Cloyd Stagers, RN of [facility] confirmed receipt of fax, reviewed and verbalized understanding of all pre-op instructions. Nurse denies that pt C/O SOB and chest pain. Please complete assessment on DOS with pt.

## 2017-10-01 DIAGNOSIS — D509 Iron deficiency anemia, unspecified: Secondary | ICD-10-CM | POA: Diagnosis not present

## 2017-10-01 DIAGNOSIS — T8249XD Other complication of vascular dialysis catheter, subsequent encounter: Secondary | ICD-10-CM | POA: Diagnosis not present

## 2017-10-01 DIAGNOSIS — N2581 Secondary hyperparathyroidism of renal origin: Secondary | ICD-10-CM | POA: Diagnosis not present

## 2017-10-01 DIAGNOSIS — N186 End stage renal disease: Secondary | ICD-10-CM | POA: Diagnosis not present

## 2017-10-01 DIAGNOSIS — D631 Anemia in chronic kidney disease: Secondary | ICD-10-CM | POA: Diagnosis not present

## 2017-10-01 DIAGNOSIS — D689 Coagulation defect, unspecified: Secondary | ICD-10-CM | POA: Diagnosis not present

## 2017-10-02 ENCOUNTER — Ambulatory Visit (HOSPITAL_COMMUNITY): Payer: Medicare Other | Admitting: Certified Registered Nurse Anesthetist

## 2017-10-02 ENCOUNTER — Ambulatory Visit (HOSPITAL_COMMUNITY)
Admission: RE | Admit: 2017-10-02 | Discharge: 2017-10-02 | Disposition: A | Discharge status: Rehabilitation Facility | Payer: Medicare Other | Source: Ambulatory Visit | Attending: Vascular Surgery | Admitting: Vascular Surgery

## 2017-10-02 ENCOUNTER — Encounter (HOSPITAL_COMMUNITY)
Admission: RE | Disposition: A | Discharge status: Rehabilitation Facility | Payer: Self-pay | Source: Ambulatory Visit | Attending: Vascular Surgery

## 2017-10-02 ENCOUNTER — Encounter (HOSPITAL_COMMUNITY): Payer: Self-pay | Admitting: *Deleted

## 2017-10-02 DIAGNOSIS — E785 Hyperlipidemia, unspecified: Secondary | ICD-10-CM | POA: Diagnosis not present

## 2017-10-02 DIAGNOSIS — N186 End stage renal disease: Secondary | ICD-10-CM | POA: Insufficient documentation

## 2017-10-02 DIAGNOSIS — I120 Hypertensive chronic kidney disease with stage 5 chronic kidney disease or end stage renal disease: Secondary | ICD-10-CM | POA: Diagnosis not present

## 2017-10-02 DIAGNOSIS — N185 Chronic kidney disease, stage 5: Secondary | ICD-10-CM | POA: Diagnosis not present

## 2017-10-02 DIAGNOSIS — K219 Gastro-esophageal reflux disease without esophagitis: Secondary | ICD-10-CM | POA: Insufficient documentation

## 2017-10-02 DIAGNOSIS — Z87891 Personal history of nicotine dependence: Secondary | ICD-10-CM | POA: Diagnosis not present

## 2017-10-02 DIAGNOSIS — Z79899 Other long term (current) drug therapy: Secondary | ICD-10-CM | POA: Diagnosis not present

## 2017-10-02 HISTORY — PX: BASCILIC VEIN TRANSPOSITION: SHX5742

## 2017-10-02 LAB — POCT I-STAT 4, (NA,K, GLUC, HGB,HCT)
GLUCOSE: 87 mg/dL (ref 65–99)
HCT: 46 % (ref 36.0–46.0)
Hemoglobin: 15.6 g/dL — ABNORMAL HIGH (ref 12.0–15.0)
Potassium: 4.2 mmol/L (ref 3.5–5.1)
SODIUM: 137 mmol/L (ref 135–145)

## 2017-10-02 SURGERY — TRANSPOSITION, VEIN, BASILIC
Anesthesia: Monitor Anesthesia Care | Site: Arm Upper | Laterality: Left

## 2017-10-02 MED ORDER — KETAMINE HCL 10 MG/ML IJ SOLN
INTRAMUSCULAR | Status: AC
  Filled 2017-10-02: qty 1

## 2017-10-02 MED ORDER — LIDOCAINE-EPINEPHRINE 0.5 %-1:200000 IJ SOLN
INTRAMUSCULAR | Status: AC
  Filled 2017-10-02: qty 1

## 2017-10-02 MED ORDER — KETAMINE HCL 10 MG/ML IJ SOLN
INTRAMUSCULAR | Status: DC | PRN
  Administered 2017-10-02 (×2): 20 mg via INTRAVENOUS

## 2017-10-02 MED ORDER — SODIUM CHLORIDE 0.9 % IV SOLN
INTRAVENOUS | Status: DC
  Administered 2017-10-02: 35 mL/h via INTRAVENOUS

## 2017-10-02 MED ORDER — PROPOFOL 1000 MG/100ML IV EMUL
INTRAVENOUS | Status: AC
  Filled 2017-10-02: qty 100

## 2017-10-02 MED ORDER — LIDOCAINE-EPINEPHRINE 0.5 %-1:200000 IJ SOLN
INTRAMUSCULAR | Status: DC | PRN
  Administered 2017-10-02: 24 mL

## 2017-10-02 MED ORDER — PHENYLEPHRINE HCL 10 MG/ML IJ SOLN
INTRAMUSCULAR | Status: DC | PRN
  Administered 2017-10-02 (×2): 80 ug via INTRAVENOUS

## 2017-10-02 MED ORDER — MIDAZOLAM HCL 5 MG/5ML IJ SOLN
INTRAMUSCULAR | Status: DC | PRN
  Administered 2017-10-02: 1 mg via INTRAVENOUS

## 2017-10-02 MED ORDER — MIDAZOLAM HCL 2 MG/2ML IJ SOLN
INTRAMUSCULAR | Status: AC
  Filled 2017-10-02: qty 2

## 2017-10-02 MED ORDER — OXYCODONE-ACETAMINOPHEN 5-325 MG PO TABS: 1.0000 | ORAL_TABLET | Freq: Four times a day (QID) | ORAL | 0 refills | Status: DC | PRN

## 2017-10-02 MED ORDER — FENTANYL CITRATE (PF) 100 MCG/2ML IJ SOLN
INTRAMUSCULAR | Status: AC
  Administered 2017-10-02: 50 ug via INTRAVENOUS
  Filled 2017-10-02: qty 2

## 2017-10-02 MED ORDER — ONDANSETRON HCL 4 MG/2ML IJ SOLN
INTRAMUSCULAR | Status: DC | PRN
  Administered 2017-10-02: 4 mg via INTRAVENOUS

## 2017-10-02 MED ORDER — GLYCOPYRROLATE 0.2 MG/ML IJ SOLN
INTRAMUSCULAR | Status: DC | PRN
  Administered 2017-10-02: .2 mg via INTRAVENOUS

## 2017-10-02 MED ORDER — SODIUM CHLORIDE 0.9 % IV SOLN
INTRAVENOUS | Status: DC | PRN
  Administered 2017-10-02: 500 mL

## 2017-10-02 MED ORDER — METOPROLOL TARTRATE 5 MG/5ML IV SOLN
INTRAVENOUS | Status: DC | PRN
  Administered 2017-10-02: 1 mg via INTRAVENOUS

## 2017-10-02 MED ORDER — PROPOFOL 10 MG/ML IV BOLUS
INTRAVENOUS | Status: DC | PRN
  Administered 2017-10-02 (×3): 30 mg via INTRAVENOUS
  Administered 2017-10-02 (×3): 20 mg via INTRAVENOUS
  Administered 2017-10-02: 30 mg via INTRAVENOUS
  Administered 2017-10-02: 20 mg via INTRAVENOUS

## 2017-10-02 MED ORDER — SODIUM CHLORIDE 0.9 % IV SOLN
INTRAVENOUS | Status: AC
  Filled 2017-10-02: qty 1.2

## 2017-10-02 MED ORDER — FENTANYL CITRATE (PF) 100 MCG/2ML IJ SOLN
25.0000 ug | INTRAMUSCULAR | Status: DC | PRN
  Administered 2017-10-02 (×2): 50 ug via INTRAVENOUS

## 2017-10-02 MED ORDER — FENTANYL CITRATE (PF) 250 MCG/5ML IJ SOLN
INTRAMUSCULAR | Status: DC | PRN
  Administered 2017-10-02 (×8): 25 ug via INTRAVENOUS

## 2017-10-02 MED ORDER — CHLORHEXIDINE GLUCONATE 4 % EX LIQD: 60.0000 mL | Freq: Once | CUTANEOUS | Status: DC

## 2017-10-02 MED ORDER — METOPROLOL TARTRATE 5 MG/5ML IV SOLN
INTRAVENOUS | Status: AC
  Filled 2017-10-02: qty 5

## 2017-10-02 MED ORDER — ONDANSETRON HCL 4 MG/2ML IJ SOLN
INTRAMUSCULAR | Status: AC
  Filled 2017-10-02: qty 2

## 2017-10-02 MED ORDER — 0.9 % SODIUM CHLORIDE (POUR BTL) OPTIME
TOPICAL | Status: DC | PRN
  Administered 2017-10-02: 1000 mL

## 2017-10-02 MED ORDER — PROPOFOL 500 MG/50ML IV EMUL
INTRAVENOUS | Status: DC | PRN
  Administered 2017-10-02: 25 ug/kg/min via INTRAVENOUS

## 2017-10-02 MED ORDER — CEFAZOLIN SODIUM-DEXTROSE 2-4 GM/100ML-% IV SOLN
2.0000 g | INTRAVENOUS | Status: AC
  Administered 2017-10-02: 2 g via INTRAVENOUS

## 2017-10-02 MED ORDER — FENTANYL CITRATE (PF) 250 MCG/5ML IJ SOLN
INTRAMUSCULAR | Status: AC
  Filled 2017-10-02: qty 5

## 2017-10-02 MED ORDER — DEXAMETHASONE SODIUM PHOSPHATE 10 MG/ML IJ SOLN
INTRAMUSCULAR | Status: AC
  Filled 2017-10-02: qty 1

## 2017-10-02 MED ORDER — DEXAMETHASONE SODIUM PHOSPHATE 10 MG/ML IJ SOLN
INTRAMUSCULAR | Status: DC | PRN
  Administered 2017-10-02: 10 mg via INTRAVENOUS

## 2017-10-02 MED ORDER — DEXTROSE 5 % IV SOLN
INTRAVENOUS | Status: DC | PRN
  Administered 2017-10-02: 25 ug/min via INTRAVENOUS

## 2017-10-02 MED ORDER — PHENYLEPHRINE 40 MCG/ML (10ML) SYRINGE FOR IV PUSH (FOR BLOOD PRESSURE SUPPORT)
PREFILLED_SYRINGE | INTRAVENOUS | Status: AC
  Filled 2017-10-02: qty 10

## 2017-10-02 MED ORDER — PROPOFOL 10 MG/ML IV BOLUS
INTRAVENOUS | Status: AC
  Filled 2017-10-02: qty 20

## 2017-10-02 MED ORDER — CEFAZOLIN SODIUM-DEXTROSE 2-4 GM/100ML-% IV SOLN
INTRAVENOUS | Status: AC
  Filled 2017-10-02: qty 100

## 2017-10-02 MED ORDER — LIDOCAINE 2% (20 MG/ML) 5 ML SYRINGE
INTRAMUSCULAR | Status: AC
  Filled 2017-10-02: qty 5

## 2017-10-02 SURGICAL SUPPLY — 31 items
ARMBAND PINK RESTRICT EXTREMIT (MISCELLANEOUS) ×2 IMPLANT
CANISTER SUCT 3000ML PPV (MISCELLANEOUS) ×2 IMPLANT
CANNULA VESSEL 3MM 2 BLNT TIP (CANNULA) ×2 IMPLANT
CLIP LIGATING EXTRA MED SLVR (CLIP) ×2 IMPLANT
CLIP LIGATING EXTRA SM BLUE (MISCELLANEOUS) ×2 IMPLANT
COVER PROBE W GEL 5X96 (DRAPES) ×2 IMPLANT
DECANTER SPIKE VIAL GLASS SM (MISCELLANEOUS) ×2 IMPLANT
DERMABOND ADHESIVE PROPEN (GAUZE/BANDAGES/DRESSINGS) ×1
DERMABOND ADVANCED (GAUZE/BANDAGES/DRESSINGS) ×1
DERMABOND ADVANCED .7 DNX12 (GAUZE/BANDAGES/DRESSINGS) ×1 IMPLANT
DERMABOND ADVANCED .7 DNX6 (GAUZE/BANDAGES/DRESSINGS) ×1 IMPLANT
ELECT REM PT RETURN 9FT ADLT (ELECTROSURGICAL) ×2
ELECTRODE REM PT RTRN 9FT ADLT (ELECTROSURGICAL) ×1 IMPLANT
GLOVE SS BIOGEL STRL SZ 7.5 (GLOVE) ×1 IMPLANT
GLOVE SUPERSENSE BIOGEL SZ 7.5 (GLOVE) ×1
GOWN STRL REUS W/ TWL LRG LVL3 (GOWN DISPOSABLE) ×3 IMPLANT
GOWN STRL REUS W/TWL LRG LVL3 (GOWN DISPOSABLE) ×3
KIT BASIN OR (CUSTOM PROCEDURE TRAY) ×2 IMPLANT
KIT TURNOVER KIT B (KITS) ×2 IMPLANT
NS IRRIG 1000ML POUR BTL (IV SOLUTION) ×2 IMPLANT
PACK CV ACCESS (CUSTOM PROCEDURE TRAY) ×2 IMPLANT
PAD ARMBOARD 7.5X6 YLW CONV (MISCELLANEOUS) ×4 IMPLANT
SUT PROLENE 6 0 CC (SUTURE) ×2 IMPLANT
SUT SILK 2 0 SH (SUTURE) ×2 IMPLANT
SUT SILK 3 0 (SUTURE) ×1
SUT SILK 3-0 18XBRD TIE 12 (SUTURE) ×1 IMPLANT
SUT VIC AB 3-0 SH 27 (SUTURE) ×3
SUT VIC AB 3-0 SH 27X BRD (SUTURE) ×3 IMPLANT
TOWEL GREEN STERILE (TOWEL DISPOSABLE) ×2 IMPLANT
UNDERPAD 30X30 (UNDERPADS AND DIAPERS) ×2 IMPLANT
WATER STERILE IRR 1000ML POUR (IV SOLUTION) ×2 IMPLANT

## 2017-10-02 NOTE — Interval H&P Note (Signed)
History and Physical Interval Note:  10/02/2017 12:15 PM  Ashley [name]  has presented today for surgery, with the diagnosis of END STAGE RENAL DISEASE FOR HEMODIALYSIS ACCESS  The various methods of treatment have been discussed with the patient and family. After consideration of risks, benefits and other options for treatment, the patient has consented to  Procedure(s): BASILIC [place] (Left) as a surgical intervention .  The patient's history has been reviewed, patient examined, no change in status, stable for surgery.  I have reviewed the patient's chart and labs.  Questions were answered to the patient's satisfaction.     Curt Jews

## 2017-10-02 NOTE — Anesthesia Preprocedure Evaluation (Signed)
Anesthesia Evaluation  Patient identified by MRN, date of birth, ID band Patient awake    Reviewed: Allergy & Precautions, NPO status , Patient's Chart, lab work & pertinent test results  History of Anesthesia Complications Negative for: history of anesthetic complications  Airway Mallampati: II  TM Distance: >3 FB Neck ROM: Full    Dental no notable dental hx. (+) Dental Advisory Given   Pulmonary Current Smoker, former smoker,    Pulmonary exam normal        Cardiovascular hypertension, Pt. on medications Normal cardiovascular exam  Left ventricle:  The cavity size was normal. Wall thickness was increased increased in a pattern of mild to moderate LVH. Systolic function was normal. The estimated ejection fraction was in the range of 60% to 65%. Wall motion was normal;   Neuro/Psych CVA, Residual Symptoms negative psych ROS   GI/Hepatic hiatal hernia, PUD, (+) Hepatitis -, C  Endo/Other    Renal/GU Renal disease     Musculoskeletal   Abdominal   Peds  Hematology   Anesthesia Other Findings   Reproductive/Obstetrics                             Lab Results  Component Value Date   WBC 8.8 07/02/2017   HGB 15.6 (H) 10/02/2017   HCT 46.0 10/02/2017   MCV 93.5 07/02/2017   PLT 390 07/02/2017   Lab Results  Component Value Date   CREATININE 5.24 (H) 07/02/2017   BUN 63 (H) 07/02/2017   NA 137 10/02/2017   K 4.2 10/02/2017   CL 98 (L) 07/02/2017   CO2 22 07/02/2017    Anesthesia Physical  Anesthesia Plan  ASA: III  Anesthesia Plan: MAC   Post-op Pain Management:    Induction: Intravenous  PONV Risk Score and Plan: 2 and Ondansetron, Treatment may vary due to age or medical condition and Propofol infusion  Airway Management Planned: Natural Airway and Simple Face Mask  Additional Equipment:   Intra-op Plan:   Post-operative Plan:   Informed Consent: I have reviewed  the patients History and Physical, chart, labs and discussed the procedure including the risks, benefits and alternatives for the proposed anesthesia with the patient or authorized representative who has indicated his/her understanding and acceptance.   Dental advisory given  Plan Discussed with: CRNA and Anesthesiologist  Anesthesia Plan Comments:         Anesthesia Quick Evaluation

## 2017-10-02 NOTE — Discharge Instructions (Signed)
° °  [facility] ° °Discharge Instructions ° °AV Fistula or Graft Surgery for Dialysis Access ° °Please refer to the following instructions for your post-procedure care. Your surgeon or physician assistant will discuss any changes with you. ° °Activity ° °You may drive the day following your surgery, if you are comfortable and no longer taking prescription pain medication. Resume full activity as the soreness in your incision resolves. ° °Bathing/Showering ° °You may shower after you go home. Keep your incision dry for 48 hours. Do not soak in a bathtub, hot tub, or swim until the incision heals completely. You may not shower if you have a hemodialysis catheter. ° °Incision Care ° °Clean your incision with mild soap and water after 48 hours. Pat the area dry with a clean towel. You do not need a bandage unless otherwise instructed. Do not apply any ointments or creams to your incision. You may have skin glue on your incision. Do not peel it off. It will come off on its own in about one week. Your arm may swell a bit after surgery. To reduce swelling use pillows to elevate your arm so it is above your heart. Your doctor will tell you if you need to lightly wrap your arm with an ACE bandage. ° °Diet ° °Resume your normal diet. There are not special food restrictions following this procedure. In order to heal from your surgery, it is CRITICAL to get adequate nutrition. Your body requires vitamins, minerals, and protein. Vegetables are the best source of vitamins and minerals. Vegetables also provide the perfect balance of protein. Processed food has little nutritional value, so try to avoid this. ° °Medications ° °Resume taking all of your medications. If your incision is causing pain, you may take over-the counter pain relievers such as acetaminophen (Tylenol). If you were prescribed a stronger pain medication, please be aware these medications can cause nausea and constipation. Prevent  nausea by taking the medication with a snack or meal. Avoid constipation by drinking plenty of fluids and eating foods with high amount of fiber, such as fruits, vegetables, and grains. Do not take Tylenol if you are taking prescription pain medications. ° ° ° ° °Follow up °Your surgeon may want to see you in the office following your access surgery. If so, this will be arranged at the time of your surgery. ° °Please call us immediately for any of the following conditions: ° °Increased pain, redness, drainage (pus) from your incision site °Fever of 101 degrees or higher °Severe or worsening pain at your incision site °Hand pain or numbness. ° °Reduce your risk of vascular disease: ° °Stop smoking. If you would like help, call [facility] at 1-800-QUIT-NOW (1-800-784-8669) or [employer] at 336-586-4000 ° °Manage your cholesterol °Maintain a desired weight °Control your diabetes °Keep your blood pressure down ° °Dialysis ° °It will take several weeks to several months for your new dialysis access to be ready for use. Your surgeon will determine when it is OK to use it. Your nephrologist will continue to direct your dialysis. You can continue to use your Permcath until your new access is ready for use. ° °If you have any questions, please call the office at [phone]. ° °

## 2017-10-02 NOTE — Op Note (Signed)
    OPERATIVE REPORT  DATE OF SURGERY: 10/02/2017  PATIENT: Ashley [name], 67 y.o. female MRN: [id number]  DOB: August 20, 1950  PRE-OPERATIVE DIAGNOSIS: End-stage renal disease  POST-OPERATIVE DIAGNOSIS:  Same  PROCEDURE: Second stage left basilic vein transposition  SURGEON:  Curt Jews, M.D.  PHYSICIAN ASSISTANT: Nurse  ANESTHESIA: Local with sedation  EBL: Minimal ml  Total I/O In: 640 [P.O.:240; I.V.:400] Out: 10 [Blood:10]  BLOOD ADMINISTERED: None  DRAINS: None  SPECIMEN: None  COUNTS CORRECT:  YES  PLAN OF CARE: PACU  PATIENT DISPOSITION:  PACU - hemodynamically stable  PROCEDURE DETAILS: Patient was taken to the operating placed supine position where the area of the left arm and left axilla were prepped and draped you sterile fashion.  Incision was made over the prior brachial artery to basilic vein anastomosis.  The vein was isolated at this level.  Separate incision was made in the mid upper arm and then at the axilla and the vein was exposed throughout.  Veins were tributary branches were ligated with 3-0 and 4-0 silk ties and divided.  The vein was marked to reduce risk for twisting.  The vein was occluded near the arteriovenous anastomosis and the vein was transected.  The vein was brought out through the tunnel.  The vein was gently dilated and was of excellent caliber.  A subcutaneous tunnel was created from the level of the antecubital incision to the axillary incision and the vein was brought back through this tunnel.  The vein was cut to the appropriate length and the vein was sewn to a cuff of vein at the brachial artery anastomosis with a running 6-0 Prolene suture.  Clamps were removed and thrill was noted.  The wounds were closed with 3-0 Vicryl in the subcutaneous and sub-particular tissue.  Sterile dressing was applied and the patient was transferred to the recovery room in stable condition   Rosetta Posner, M.D., [facility] 10/02/2017 4:44 PM

## 2017-10-02 NOTE — Transfer of Care (Signed)
Immediate Anesthesia Transfer of Care Note  Patient: Ashley [name]  Procedure(s) Performed: BASILIC VEIN TRANSPOSITION SECOND STAGE LEFT ARM (Left Arm Upper)  Patient Location: PACU  Anesthesia Type:MAC  Level of Consciousness: awake, alert  and oriented  Airway & Oxygen Therapy: Patient Spontanous Breathing and Patient connected to nasal cannula oxygen  Post-op Assessment: Report given to RN and Post -op Vital signs reviewed and stable  Post vital signs: Reviewed and stable  Last Vitals:  Vitals Value Taken Time  BP 139/79 10/02/2017  3:16 PM  Temp 36.8 C 10/02/2017  3:16 PM  Pulse 77 10/02/2017  3:18 PM  Resp 12 10/02/2017  3:18 PM  SpO2 94 % 10/02/2017  3:18 PM  Vitals shown include unvalidated device data.  Last Pain:  Vitals:   10/02/17 1043  TempSrc:   PainSc: 0-No pain         Complications: No apparent anesthesia complications

## 2017-10-03 DIAGNOSIS — D509 Iron deficiency anemia, unspecified: Secondary | ICD-10-CM | POA: Diagnosis not present

## 2017-10-03 DIAGNOSIS — T8249XD Other complication of vascular dialysis catheter, subsequent encounter: Secondary | ICD-10-CM | POA: Diagnosis not present

## 2017-10-03 DIAGNOSIS — N2581 Secondary hyperparathyroidism of renal origin: Secondary | ICD-10-CM | POA: Diagnosis not present

## 2017-10-03 DIAGNOSIS — D631 Anemia in chronic kidney disease: Secondary | ICD-10-CM | POA: Diagnosis not present

## 2017-10-03 DIAGNOSIS — N186 End stage renal disease: Secondary | ICD-10-CM | POA: Diagnosis not present

## 2017-10-03 DIAGNOSIS — D689 Coagulation defect, unspecified: Secondary | ICD-10-CM | POA: Diagnosis not present

## 2017-10-03 NOTE — Anesthesia Postprocedure Evaluation (Signed)
Anesthesia Post Note  Patient: Ashley [name]  Procedure(s) Performed: BASILIC VEIN TRANSPOSITION SECOND STAGE LEFT ARM (Left Arm Upper)     Patient location during evaluation: PACU Anesthesia Type: MAC Level of consciousness: awake and alert Pain management: pain level controlled Vital Signs Assessment: post-procedure vital signs reviewed and stable Respiratory status: spontaneous breathing, nonlabored ventilation, respiratory function stable and patient connected to nasal cannula oxygen Cardiovascular status: stable and blood pressure returned to baseline Postop Assessment: no apparent nausea or vomiting Anesthetic complications: no    Last Vitals:  Vitals:   10/02/17 1600 10/02/17 1615  BP: 120/75 128/75  Pulse: 70 75  Resp: 13 15  Temp:  (!) 36.1 C  SpO2: 93% 92%    Last Pain:  Vitals:   10/02/17 1615  TempSrc:   PainSc: 0-No pain                 Tiajuana Amass

## 2017-10-04 ENCOUNTER — Encounter (HOSPITAL_COMMUNITY): Payer: Self-pay | Admitting: Vascular Surgery

## 2017-10-05 ENCOUNTER — Telehealth: Payer: Self-pay | Admitting: Surgery

## 2017-10-05 NOTE — Telephone Encounter (Signed)
-----   Message from Mena Goes, RN sent at 10/03/2017  5:25 PM EDT ----- Regarding: 5-6 weeks in [facility] clinic   ----- Message ----- From: Ulyses Amor, PA-C Sent: 10/02/2017   2:56 PM To: [facility] Charge Pool  F/u in [place] wed. Clinic 5-6 weeks s/p second stage basilic av fistula

## 2017-10-05 NOTE — Telephone Encounter (Signed)
Sched appt 11/18/17 at 1:00. Spoke to pt to inform of appt.

## 2017-10-06 DIAGNOSIS — N2581 Secondary hyperparathyroidism of renal origin: Secondary | ICD-10-CM | POA: Diagnosis not present

## 2017-10-06 DIAGNOSIS — D631 Anemia in chronic kidney disease: Secondary | ICD-10-CM | POA: Diagnosis not present

## 2017-10-06 DIAGNOSIS — N186 End stage renal disease: Secondary | ICD-10-CM | POA: Diagnosis not present

## 2017-10-06 DIAGNOSIS — T8249XD Other complication of vascular dialysis catheter, subsequent encounter: Secondary | ICD-10-CM | POA: Diagnosis not present

## 2017-10-06 DIAGNOSIS — D509 Iron deficiency anemia, unspecified: Secondary | ICD-10-CM | POA: Diagnosis not present

## 2017-10-06 DIAGNOSIS — D689 Coagulation defect, unspecified: Secondary | ICD-10-CM | POA: Diagnosis not present

## 2017-10-08 DIAGNOSIS — T8249XD Other complication of vascular dialysis catheter, subsequent encounter: Secondary | ICD-10-CM | POA: Diagnosis not present

## 2017-10-08 DIAGNOSIS — R51 Headache: Secondary | ICD-10-CM | POA: Diagnosis not present

## 2017-10-08 DIAGNOSIS — N186 End stage renal disease: Secondary | ICD-10-CM | POA: Diagnosis not present

## 2017-10-08 DIAGNOSIS — D509 Iron deficiency anemia, unspecified: Secondary | ICD-10-CM | POA: Diagnosis not present

## 2017-10-08 DIAGNOSIS — D689 Coagulation defect, unspecified: Secondary | ICD-10-CM | POA: Diagnosis not present

## 2017-10-08 DIAGNOSIS — Z992 Dependence on renal dialysis: Secondary | ICD-10-CM | POA: Diagnosis not present

## 2017-10-08 DIAGNOSIS — N2581 Secondary hyperparathyroidism of renal origin: Secondary | ICD-10-CM | POA: Diagnosis not present

## 2017-10-10 DIAGNOSIS — D689 Coagulation defect, unspecified: Secondary | ICD-10-CM | POA: Diagnosis not present

## 2017-10-10 DIAGNOSIS — Z992 Dependence on renal dialysis: Secondary | ICD-10-CM | POA: Diagnosis not present

## 2017-10-10 DIAGNOSIS — D509 Iron deficiency anemia, unspecified: Secondary | ICD-10-CM | POA: Diagnosis not present

## 2017-10-10 DIAGNOSIS — N2581 Secondary hyperparathyroidism of renal origin: Secondary | ICD-10-CM | POA: Diagnosis not present

## 2017-10-10 DIAGNOSIS — N186 End stage renal disease: Secondary | ICD-10-CM | POA: Diagnosis not present

## 2017-10-10 DIAGNOSIS — R51 Headache: Secondary | ICD-10-CM | POA: Diagnosis not present

## 2017-10-10 DIAGNOSIS — T8249XD Other complication of vascular dialysis catheter, subsequent encounter: Secondary | ICD-10-CM | POA: Diagnosis not present

## 2017-10-12 ENCOUNTER — Ambulatory Visit: Payer: Medicare Other | Admitting: Surgery

## 2017-10-13 DIAGNOSIS — D509 Iron deficiency anemia, unspecified: Secondary | ICD-10-CM | POA: Diagnosis not present

## 2017-10-13 DIAGNOSIS — D689 Coagulation defect, unspecified: Secondary | ICD-10-CM | POA: Diagnosis not present

## 2017-10-13 DIAGNOSIS — R51 Headache: Secondary | ICD-10-CM | POA: Diagnosis not present

## 2017-10-13 DIAGNOSIS — N2581 Secondary hyperparathyroidism of renal origin: Secondary | ICD-10-CM | POA: Diagnosis not present

## 2017-10-13 DIAGNOSIS — N186 End stage renal disease: Secondary | ICD-10-CM | POA: Diagnosis not present

## 2017-10-13 DIAGNOSIS — T8249XD Other complication of vascular dialysis catheter, subsequent encounter: Secondary | ICD-10-CM | POA: Diagnosis not present

## 2017-10-13 DIAGNOSIS — Z992 Dependence on renal dialysis: Secondary | ICD-10-CM | POA: Diagnosis not present

## 2017-10-14 DIAGNOSIS — D689 Coagulation defect, unspecified: Secondary | ICD-10-CM | POA: Diagnosis not present

## 2017-10-14 DIAGNOSIS — T8249XD Other complication of vascular dialysis catheter, subsequent encounter: Secondary | ICD-10-CM | POA: Diagnosis not present

## 2017-10-14 DIAGNOSIS — N186 End stage renal disease: Secondary | ICD-10-CM | POA: Diagnosis not present

## 2017-10-14 DIAGNOSIS — Z992 Dependence on renal dialysis: Secondary | ICD-10-CM | POA: Diagnosis not present

## 2017-10-14 DIAGNOSIS — N2581 Secondary hyperparathyroidism of renal origin: Secondary | ICD-10-CM | POA: Diagnosis not present

## 2017-10-14 DIAGNOSIS — T8249XA Other complication of vascular dialysis catheter, initial encounter: Secondary | ICD-10-CM | POA: Diagnosis not present

## 2017-10-14 DIAGNOSIS — D509 Iron deficiency anemia, unspecified: Secondary | ICD-10-CM | POA: Diagnosis not present

## 2017-10-14 DIAGNOSIS — R51 Headache: Secondary | ICD-10-CM | POA: Diagnosis not present

## 2017-10-15 DIAGNOSIS — T8249XD Other complication of vascular dialysis catheter, subsequent encounter: Secondary | ICD-10-CM | POA: Diagnosis not present

## 2017-10-15 DIAGNOSIS — D689 Coagulation defect, unspecified: Secondary | ICD-10-CM | POA: Diagnosis not present

## 2017-10-15 DIAGNOSIS — N186 End stage renal disease: Secondary | ICD-10-CM | POA: Diagnosis not present

## 2017-10-15 DIAGNOSIS — N2581 Secondary hyperparathyroidism of renal origin: Secondary | ICD-10-CM | POA: Diagnosis not present

## 2017-10-15 DIAGNOSIS — R51 Headache: Secondary | ICD-10-CM | POA: Diagnosis not present

## 2017-10-15 DIAGNOSIS — D509 Iron deficiency anemia, unspecified: Secondary | ICD-10-CM | POA: Diagnosis not present

## 2017-10-15 DIAGNOSIS — Z992 Dependence on renal dialysis: Secondary | ICD-10-CM | POA: Diagnosis not present

## 2017-10-17 DIAGNOSIS — Z992 Dependence on renal dialysis: Secondary | ICD-10-CM | POA: Diagnosis not present

## 2017-10-17 DIAGNOSIS — T8249XD Other complication of vascular dialysis catheter, subsequent encounter: Secondary | ICD-10-CM | POA: Diagnosis not present

## 2017-10-17 DIAGNOSIS — D509 Iron deficiency anemia, unspecified: Secondary | ICD-10-CM | POA: Diagnosis not present

## 2017-10-17 DIAGNOSIS — N2581 Secondary hyperparathyroidism of renal origin: Secondary | ICD-10-CM | POA: Diagnosis not present

## 2017-10-17 DIAGNOSIS — D689 Coagulation defect, unspecified: Secondary | ICD-10-CM | POA: Diagnosis not present

## 2017-10-17 DIAGNOSIS — R51 Headache: Secondary | ICD-10-CM | POA: Diagnosis not present

## 2017-10-17 DIAGNOSIS — N186 End stage renal disease: Secondary | ICD-10-CM | POA: Diagnosis not present

## 2017-10-20 DIAGNOSIS — N186 End stage renal disease: Secondary | ICD-10-CM | POA: Diagnosis not present

## 2017-10-20 DIAGNOSIS — D509 Iron deficiency anemia, unspecified: Secondary | ICD-10-CM | POA: Diagnosis not present

## 2017-10-20 DIAGNOSIS — N2581 Secondary hyperparathyroidism of renal origin: Secondary | ICD-10-CM | POA: Diagnosis not present

## 2017-10-20 DIAGNOSIS — T8249XD Other complication of vascular dialysis catheter, subsequent encounter: Secondary | ICD-10-CM | POA: Diagnosis not present

## 2017-10-20 DIAGNOSIS — Z992 Dependence on renal dialysis: Secondary | ICD-10-CM | POA: Diagnosis not present

## 2017-10-20 DIAGNOSIS — D689 Coagulation defect, unspecified: Secondary | ICD-10-CM | POA: Diagnosis not present

## 2017-10-20 DIAGNOSIS — R51 Headache: Secondary | ICD-10-CM | POA: Diagnosis not present

## 2017-10-22 DIAGNOSIS — D689 Coagulation defect, unspecified: Secondary | ICD-10-CM | POA: Diagnosis not present

## 2017-10-22 DIAGNOSIS — D509 Iron deficiency anemia, unspecified: Secondary | ICD-10-CM | POA: Diagnosis not present

## 2017-10-22 DIAGNOSIS — Z992 Dependence on renal dialysis: Secondary | ICD-10-CM | POA: Diagnosis not present

## 2017-10-22 DIAGNOSIS — T8249XD Other complication of vascular dialysis catheter, subsequent encounter: Secondary | ICD-10-CM | POA: Diagnosis not present

## 2017-10-22 DIAGNOSIS — N2581 Secondary hyperparathyroidism of renal origin: Secondary | ICD-10-CM | POA: Diagnosis not present

## 2017-10-22 DIAGNOSIS — N186 End stage renal disease: Secondary | ICD-10-CM | POA: Diagnosis not present

## 2017-10-22 DIAGNOSIS — R51 Headache: Secondary | ICD-10-CM | POA: Diagnosis not present

## 2017-10-24 DIAGNOSIS — N186 End stage renal disease: Secondary | ICD-10-CM | POA: Diagnosis not present

## 2017-10-24 DIAGNOSIS — T8249XD Other complication of vascular dialysis catheter, subsequent encounter: Secondary | ICD-10-CM | POA: Diagnosis not present

## 2017-10-24 DIAGNOSIS — Z992 Dependence on renal dialysis: Secondary | ICD-10-CM | POA: Diagnosis not present

## 2017-10-24 DIAGNOSIS — R51 Headache: Secondary | ICD-10-CM | POA: Diagnosis not present

## 2017-10-24 DIAGNOSIS — D509 Iron deficiency anemia, unspecified: Secondary | ICD-10-CM | POA: Diagnosis not present

## 2017-10-24 DIAGNOSIS — D689 Coagulation defect, unspecified: Secondary | ICD-10-CM | POA: Diagnosis not present

## 2017-10-24 DIAGNOSIS — N2581 Secondary hyperparathyroidism of renal origin: Secondary | ICD-10-CM | POA: Diagnosis not present

## 2017-10-27 DIAGNOSIS — R51 Headache: Secondary | ICD-10-CM | POA: Diagnosis not present

## 2017-10-27 DIAGNOSIS — N186 End stage renal disease: Secondary | ICD-10-CM | POA: Diagnosis not present

## 2017-10-27 DIAGNOSIS — Z992 Dependence on renal dialysis: Secondary | ICD-10-CM | POA: Diagnosis not present

## 2017-10-27 DIAGNOSIS — D509 Iron deficiency anemia, unspecified: Secondary | ICD-10-CM | POA: Diagnosis not present

## 2017-10-27 DIAGNOSIS — T8249XD Other complication of vascular dialysis catheter, subsequent encounter: Secondary | ICD-10-CM | POA: Diagnosis not present

## 2017-10-27 DIAGNOSIS — N2581 Secondary hyperparathyroidism of renal origin: Secondary | ICD-10-CM | POA: Diagnosis not present

## 2017-10-27 DIAGNOSIS — D689 Coagulation defect, unspecified: Secondary | ICD-10-CM | POA: Diagnosis not present

## 2017-10-29 DIAGNOSIS — R51 Headache: Secondary | ICD-10-CM | POA: Diagnosis not present

## 2017-10-29 DIAGNOSIS — T8249XD Other complication of vascular dialysis catheter, subsequent encounter: Secondary | ICD-10-CM | POA: Diagnosis not present

## 2017-10-29 DIAGNOSIS — N186 End stage renal disease: Secondary | ICD-10-CM | POA: Diagnosis not present

## 2017-10-29 DIAGNOSIS — D509 Iron deficiency anemia, unspecified: Secondary | ICD-10-CM | POA: Diagnosis not present

## 2017-10-29 DIAGNOSIS — Z992 Dependence on renal dialysis: Secondary | ICD-10-CM | POA: Diagnosis not present

## 2017-10-29 DIAGNOSIS — N2581 Secondary hyperparathyroidism of renal origin: Secondary | ICD-10-CM | POA: Diagnosis not present

## 2017-10-29 DIAGNOSIS — D689 Coagulation defect, unspecified: Secondary | ICD-10-CM | POA: Diagnosis not present

## 2017-10-31 DIAGNOSIS — R51 Headache: Secondary | ICD-10-CM | POA: Diagnosis not present

## 2017-10-31 DIAGNOSIS — D509 Iron deficiency anemia, unspecified: Secondary | ICD-10-CM | POA: Diagnosis not present

## 2017-10-31 DIAGNOSIS — Z992 Dependence on renal dialysis: Secondary | ICD-10-CM | POA: Diagnosis not present

## 2017-10-31 DIAGNOSIS — N186 End stage renal disease: Secondary | ICD-10-CM | POA: Diagnosis not present

## 2017-10-31 DIAGNOSIS — T8249XD Other complication of vascular dialysis catheter, subsequent encounter: Secondary | ICD-10-CM | POA: Diagnosis not present

## 2017-10-31 DIAGNOSIS — D689 Coagulation defect, unspecified: Secondary | ICD-10-CM | POA: Diagnosis not present

## 2017-10-31 DIAGNOSIS — N2581 Secondary hyperparathyroidism of renal origin: Secondary | ICD-10-CM | POA: Diagnosis not present

## 2017-11-03 DIAGNOSIS — N186 End stage renal disease: Secondary | ICD-10-CM | POA: Diagnosis not present

## 2017-11-03 DIAGNOSIS — D689 Coagulation defect, unspecified: Secondary | ICD-10-CM | POA: Diagnosis not present

## 2017-11-03 DIAGNOSIS — T8249XD Other complication of vascular dialysis catheter, subsequent encounter: Secondary | ICD-10-CM | POA: Diagnosis not present

## 2017-11-03 DIAGNOSIS — Z992 Dependence on renal dialysis: Secondary | ICD-10-CM | POA: Diagnosis not present

## 2017-11-03 DIAGNOSIS — D509 Iron deficiency anemia, unspecified: Secondary | ICD-10-CM | POA: Diagnosis not present

## 2017-11-03 DIAGNOSIS — R51 Headache: Secondary | ICD-10-CM | POA: Diagnosis not present

## 2017-11-03 DIAGNOSIS — N2581 Secondary hyperparathyroidism of renal origin: Secondary | ICD-10-CM | POA: Diagnosis not present

## 2017-11-05 DIAGNOSIS — D509 Iron deficiency anemia, unspecified: Secondary | ICD-10-CM | POA: Diagnosis not present

## 2017-11-05 DIAGNOSIS — Z992 Dependence on renal dialysis: Secondary | ICD-10-CM | POA: Diagnosis not present

## 2017-11-05 DIAGNOSIS — N186 End stage renal disease: Secondary | ICD-10-CM | POA: Diagnosis not present

## 2017-11-05 DIAGNOSIS — N2581 Secondary hyperparathyroidism of renal origin: Secondary | ICD-10-CM | POA: Diagnosis not present

## 2017-11-05 DIAGNOSIS — T8249XD Other complication of vascular dialysis catheter, subsequent encounter: Secondary | ICD-10-CM | POA: Diagnosis not present

## 2017-11-05 DIAGNOSIS — R51 Headache: Secondary | ICD-10-CM | POA: Diagnosis not present

## 2017-11-05 DIAGNOSIS — D689 Coagulation defect, unspecified: Secondary | ICD-10-CM | POA: Diagnosis not present

## 2017-11-06 DIAGNOSIS — N179 Acute kidney failure, unspecified: Secondary | ICD-10-CM | POA: Diagnosis not present

## 2017-11-06 DIAGNOSIS — Z992 Dependence on renal dialysis: Secondary | ICD-10-CM | POA: Diagnosis not present

## 2017-11-06 DIAGNOSIS — N186 End stage renal disease: Secondary | ICD-10-CM | POA: Diagnosis not present

## 2017-11-07 DIAGNOSIS — T8249XD Other complication of vascular dialysis catheter, subsequent encounter: Secondary | ICD-10-CM | POA: Diagnosis not present

## 2017-11-07 DIAGNOSIS — N2581 Secondary hyperparathyroidism of renal origin: Secondary | ICD-10-CM | POA: Diagnosis not present

## 2017-11-07 DIAGNOSIS — N186 End stage renal disease: Secondary | ICD-10-CM | POA: Diagnosis not present

## 2017-11-07 DIAGNOSIS — D631 Anemia in chronic kidney disease: Secondary | ICD-10-CM | POA: Diagnosis not present

## 2017-11-07 DIAGNOSIS — D509 Iron deficiency anemia, unspecified: Secondary | ICD-10-CM | POA: Diagnosis not present

## 2017-11-07 DIAGNOSIS — D689 Coagulation defect, unspecified: Secondary | ICD-10-CM | POA: Diagnosis not present

## 2017-11-10 DIAGNOSIS — N2581 Secondary hyperparathyroidism of renal origin: Secondary | ICD-10-CM | POA: Diagnosis not present

## 2017-11-10 DIAGNOSIS — N186 End stage renal disease: Secondary | ICD-10-CM | POA: Diagnosis not present

## 2017-11-10 DIAGNOSIS — T8249XD Other complication of vascular dialysis catheter, subsequent encounter: Secondary | ICD-10-CM | POA: Diagnosis not present

## 2017-11-10 DIAGNOSIS — D631 Anemia in chronic kidney disease: Secondary | ICD-10-CM | POA: Diagnosis not present

## 2017-11-10 DIAGNOSIS — D689 Coagulation defect, unspecified: Secondary | ICD-10-CM | POA: Diagnosis not present

## 2017-11-10 DIAGNOSIS — D509 Iron deficiency anemia, unspecified: Secondary | ICD-10-CM | POA: Diagnosis not present

## 2017-11-12 DIAGNOSIS — N186 End stage renal disease: Secondary | ICD-10-CM | POA: Diagnosis not present

## 2017-11-12 DIAGNOSIS — T8249XD Other complication of vascular dialysis catheter, subsequent encounter: Secondary | ICD-10-CM | POA: Diagnosis not present

## 2017-11-12 DIAGNOSIS — N2581 Secondary hyperparathyroidism of renal origin: Secondary | ICD-10-CM | POA: Diagnosis not present

## 2017-11-12 DIAGNOSIS — D631 Anemia in chronic kidney disease: Secondary | ICD-10-CM | POA: Diagnosis not present

## 2017-11-12 DIAGNOSIS — D509 Iron deficiency anemia, unspecified: Secondary | ICD-10-CM | POA: Diagnosis not present

## 2017-11-12 DIAGNOSIS — D689 Coagulation defect, unspecified: Secondary | ICD-10-CM | POA: Diagnosis not present

## 2017-11-14 DIAGNOSIS — N186 End stage renal disease: Secondary | ICD-10-CM | POA: Diagnosis not present

## 2017-11-14 DIAGNOSIS — D631 Anemia in chronic kidney disease: Secondary | ICD-10-CM | POA: Diagnosis not present

## 2017-11-14 DIAGNOSIS — T8249XD Other complication of vascular dialysis catheter, subsequent encounter: Secondary | ICD-10-CM | POA: Diagnosis not present

## 2017-11-14 DIAGNOSIS — N2581 Secondary hyperparathyroidism of renal origin: Secondary | ICD-10-CM | POA: Diagnosis not present

## 2017-11-14 DIAGNOSIS — D689 Coagulation defect, unspecified: Secondary | ICD-10-CM | POA: Diagnosis not present

## 2017-11-14 DIAGNOSIS — D509 Iron deficiency anemia, unspecified: Secondary | ICD-10-CM | POA: Diagnosis not present

## 2017-11-17 DIAGNOSIS — N186 End stage renal disease: Secondary | ICD-10-CM | POA: Diagnosis not present

## 2017-11-17 DIAGNOSIS — N2581 Secondary hyperparathyroidism of renal origin: Secondary | ICD-10-CM | POA: Diagnosis not present

## 2017-11-17 DIAGNOSIS — D631 Anemia in chronic kidney disease: Secondary | ICD-10-CM | POA: Diagnosis not present

## 2017-11-17 DIAGNOSIS — T8249XD Other complication of vascular dialysis catheter, subsequent encounter: Secondary | ICD-10-CM | POA: Diagnosis not present

## 2017-11-17 DIAGNOSIS — D689 Coagulation defect, unspecified: Secondary | ICD-10-CM | POA: Diagnosis not present

## 2017-11-17 DIAGNOSIS — D509 Iron deficiency anemia, unspecified: Secondary | ICD-10-CM | POA: Diagnosis not present

## 2017-11-18 ENCOUNTER — Ambulatory Visit (INDEPENDENT_AMBULATORY_CARE_PROVIDER_SITE_OTHER): Payer: Self-pay | Admitting: Physician Assistant

## 2017-11-18 ENCOUNTER — Other Ambulatory Visit: Payer: Self-pay

## 2017-11-18 VITALS — BP 119/76 | HR 76 | Temp 97.9°F | Resp 16 | Ht 62.0 in | Wt 132.0 lb

## 2017-11-18 DIAGNOSIS — N186 End stage renal disease: Secondary | ICD-10-CM | POA: Diagnosis not present

## 2017-11-18 DIAGNOSIS — I10 Essential (primary) hypertension: Secondary | ICD-10-CM | POA: Diagnosis not present

## 2017-11-18 DIAGNOSIS — I639 Cerebral infarction, unspecified: Secondary | ICD-10-CM | POA: Diagnosis not present

## 2017-11-18 DIAGNOSIS — Z992 Dependence on renal dialysis: Secondary | ICD-10-CM

## 2017-11-18 NOTE — Progress Notes (Signed)
    Postoperative Access Visit   History of Present Illness   Ashley [name] is a 67 y.o. year old female who presents for postoperative follow-up for: left second stage basilic vein transposition by Dr. Donnetta Hutching  (Date: 10/02/17).  The patient's wounds are healed.  The patient denies steal symptoms.  The patient currently resides in a nursing facility and is seen in a wheelchair today.  She is currently dialyzing on a TTS schedule from L groin [facility].   Physical Examination   Vitals:   11/18/17 1249  BP: 119/76  Pulse: 76  Resp: 16  Temp: 97.9 F (36.6 C)  TempSrc: Oral  SpO2: 99%  Weight: 132 lb (59.9 kg)  Height: 5\' 2"  (1.575 m)   Body mass index is 24.14 kg/m.  left arm Incision is healed, hand grip is 5/5, sensation in digits is intact, palpable thrill, bruit can be auscultated; palpable L radial pulse     Medical Decision Making   Ashley [name] is a 67 y.o. year old female who presents s/p left second stage basilic vein transposition   The patient's access is ready for use.  The patient's tunneled dialysis catheter can be removed when Nephrology is comfortable with the performance of the fistula during HD  The patient may follow up as needed   Dagoberto Ligas, PA-C [facility]: [phone]

## 2017-11-19 DIAGNOSIS — D509 Iron deficiency anemia, unspecified: Secondary | ICD-10-CM | POA: Diagnosis not present

## 2017-11-19 DIAGNOSIS — T8249XD Other complication of vascular dialysis catheter, subsequent encounter: Secondary | ICD-10-CM | POA: Diagnosis not present

## 2017-11-19 DIAGNOSIS — N2581 Secondary hyperparathyroidism of renal origin: Secondary | ICD-10-CM | POA: Diagnosis not present

## 2017-11-19 DIAGNOSIS — N186 End stage renal disease: Secondary | ICD-10-CM | POA: Diagnosis not present

## 2017-11-19 DIAGNOSIS — D631 Anemia in chronic kidney disease: Secondary | ICD-10-CM | POA: Diagnosis not present

## 2017-11-19 DIAGNOSIS — D689 Coagulation defect, unspecified: Secondary | ICD-10-CM | POA: Diagnosis not present

## 2017-11-21 DIAGNOSIS — N2581 Secondary hyperparathyroidism of renal origin: Secondary | ICD-10-CM | POA: Diagnosis not present

## 2017-11-21 DIAGNOSIS — D509 Iron deficiency anemia, unspecified: Secondary | ICD-10-CM | POA: Diagnosis not present

## 2017-11-21 DIAGNOSIS — D631 Anemia in chronic kidney disease: Secondary | ICD-10-CM | POA: Diagnosis not present

## 2017-11-21 DIAGNOSIS — N186 End stage renal disease: Secondary | ICD-10-CM | POA: Diagnosis not present

## 2017-11-21 DIAGNOSIS — D689 Coagulation defect, unspecified: Secondary | ICD-10-CM | POA: Diagnosis not present

## 2017-11-21 DIAGNOSIS — T8249XD Other complication of vascular dialysis catheter, subsequent encounter: Secondary | ICD-10-CM | POA: Diagnosis not present

## 2017-11-24 DIAGNOSIS — N186 End stage renal disease: Secondary | ICD-10-CM | POA: Diagnosis not present

## 2017-11-24 DIAGNOSIS — N2581 Secondary hyperparathyroidism of renal origin: Secondary | ICD-10-CM | POA: Diagnosis not present

## 2017-11-24 DIAGNOSIS — T8249XD Other complication of vascular dialysis catheter, subsequent encounter: Secondary | ICD-10-CM | POA: Diagnosis not present

## 2017-11-24 DIAGNOSIS — D509 Iron deficiency anemia, unspecified: Secondary | ICD-10-CM | POA: Diagnosis not present

## 2017-11-24 DIAGNOSIS — D689 Coagulation defect, unspecified: Secondary | ICD-10-CM | POA: Diagnosis not present

## 2017-11-24 DIAGNOSIS — D631 Anemia in chronic kidney disease: Secondary | ICD-10-CM | POA: Diagnosis not present

## 2017-11-26 DIAGNOSIS — N2581 Secondary hyperparathyroidism of renal origin: Secondary | ICD-10-CM | POA: Diagnosis not present

## 2017-11-26 DIAGNOSIS — D509 Iron deficiency anemia, unspecified: Secondary | ICD-10-CM | POA: Diagnosis not present

## 2017-11-26 DIAGNOSIS — N186 End stage renal disease: Secondary | ICD-10-CM | POA: Diagnosis not present

## 2017-11-26 DIAGNOSIS — T8249XD Other complication of vascular dialysis catheter, subsequent encounter: Secondary | ICD-10-CM | POA: Diagnosis not present

## 2017-11-26 DIAGNOSIS — D631 Anemia in chronic kidney disease: Secondary | ICD-10-CM | POA: Diagnosis not present

## 2017-11-26 DIAGNOSIS — D689 Coagulation defect, unspecified: Secondary | ICD-10-CM | POA: Diagnosis not present

## 2017-11-28 DIAGNOSIS — D689 Coagulation defect, unspecified: Secondary | ICD-10-CM | POA: Diagnosis not present

## 2017-11-28 DIAGNOSIS — D631 Anemia in chronic kidney disease: Secondary | ICD-10-CM | POA: Diagnosis not present

## 2017-11-28 DIAGNOSIS — D509 Iron deficiency anemia, unspecified: Secondary | ICD-10-CM | POA: Diagnosis not present

## 2017-11-28 DIAGNOSIS — N2581 Secondary hyperparathyroidism of renal origin: Secondary | ICD-10-CM | POA: Diagnosis not present

## 2017-11-28 DIAGNOSIS — T8249XD Other complication of vascular dialysis catheter, subsequent encounter: Secondary | ICD-10-CM | POA: Diagnosis not present

## 2017-11-28 DIAGNOSIS — N186 End stage renal disease: Secondary | ICD-10-CM | POA: Diagnosis not present

## 2017-12-01 DIAGNOSIS — N186 End stage renal disease: Secondary | ICD-10-CM | POA: Diagnosis not present

## 2017-12-01 DIAGNOSIS — D509 Iron deficiency anemia, unspecified: Secondary | ICD-10-CM | POA: Diagnosis not present

## 2017-12-01 DIAGNOSIS — N2581 Secondary hyperparathyroidism of renal origin: Secondary | ICD-10-CM | POA: Diagnosis not present

## 2017-12-01 DIAGNOSIS — D689 Coagulation defect, unspecified: Secondary | ICD-10-CM | POA: Diagnosis not present

## 2017-12-01 DIAGNOSIS — T8249XD Other complication of vascular dialysis catheter, subsequent encounter: Secondary | ICD-10-CM | POA: Diagnosis not present

## 2017-12-01 DIAGNOSIS — D631 Anemia in chronic kidney disease: Secondary | ICD-10-CM | POA: Diagnosis not present

## 2017-12-03 DIAGNOSIS — T8249XD Other complication of vascular dialysis catheter, subsequent encounter: Secondary | ICD-10-CM | POA: Diagnosis not present

## 2017-12-03 DIAGNOSIS — N186 End stage renal disease: Secondary | ICD-10-CM | POA: Diagnosis not present

## 2017-12-03 DIAGNOSIS — D689 Coagulation defect, unspecified: Secondary | ICD-10-CM | POA: Diagnosis not present

## 2017-12-03 DIAGNOSIS — N2581 Secondary hyperparathyroidism of renal origin: Secondary | ICD-10-CM | POA: Diagnosis not present

## 2017-12-03 DIAGNOSIS — D509 Iron deficiency anemia, unspecified: Secondary | ICD-10-CM | POA: Diagnosis not present

## 2017-12-03 DIAGNOSIS — D631 Anemia in chronic kidney disease: Secondary | ICD-10-CM | POA: Diagnosis not present

## 2017-12-05 DIAGNOSIS — N2581 Secondary hyperparathyroidism of renal origin: Secondary | ICD-10-CM | POA: Diagnosis not present

## 2017-12-05 DIAGNOSIS — D631 Anemia in chronic kidney disease: Secondary | ICD-10-CM | POA: Diagnosis not present

## 2017-12-05 DIAGNOSIS — N186 End stage renal disease: Secondary | ICD-10-CM | POA: Diagnosis not present

## 2017-12-05 DIAGNOSIS — T8249XD Other complication of vascular dialysis catheter, subsequent encounter: Secondary | ICD-10-CM | POA: Diagnosis not present

## 2017-12-05 DIAGNOSIS — D689 Coagulation defect, unspecified: Secondary | ICD-10-CM | POA: Diagnosis not present

## 2017-12-05 DIAGNOSIS — D509 Iron deficiency anemia, unspecified: Secondary | ICD-10-CM | POA: Diagnosis not present

## 2017-12-06 DIAGNOSIS — Z992 Dependence on renal dialysis: Secondary | ICD-10-CM | POA: Diagnosis not present

## 2017-12-06 DIAGNOSIS — N186 End stage renal disease: Secondary | ICD-10-CM | POA: Diagnosis not present

## 2017-12-06 DIAGNOSIS — N179 Acute kidney failure, unspecified: Secondary | ICD-10-CM | POA: Diagnosis not present

## 2018-02-04 ENCOUNTER — Ambulatory Visit: Payer: Medicare Other | Admitting: Family Medicine

## 2018-02-05 ENCOUNTER — Ambulatory Visit: Payer: Medicare Other | Admitting: Family Medicine

## 2018-04-02 ENCOUNTER — Encounter: Payer: Self-pay | Admitting: Internal Medicine

## 2018-04-02 ENCOUNTER — Ambulatory Visit: Payer: Medicare Other | Attending: Internal Medicine | Admitting: Internal Medicine

## 2018-04-02 VITALS — BP 149/80 | HR 82 | Temp 98.0°F | Resp 16 | Ht 62.0 in | Wt 150.0 lb

## 2018-04-02 DIAGNOSIS — F17200 Nicotine dependence, unspecified, uncomplicated: Secondary | ICD-10-CM | POA: Diagnosis not present

## 2018-04-02 DIAGNOSIS — N186 End stage renal disease: Secondary | ICD-10-CM | POA: Insufficient documentation

## 2018-04-02 DIAGNOSIS — Z90710 Acquired absence of both cervix and uterus: Secondary | ICD-10-CM | POA: Insufficient documentation

## 2018-04-02 DIAGNOSIS — Z79899 Other long term (current) drug therapy: Secondary | ICD-10-CM | POA: Insufficient documentation

## 2018-04-02 DIAGNOSIS — Z8711 Personal history of peptic ulcer disease: Secondary | ICD-10-CM | POA: Insufficient documentation

## 2018-04-02 DIAGNOSIS — I120 Hypertensive chronic kidney disease with stage 5 chronic kidney disease or end stage renal disease: Secondary | ICD-10-CM | POA: Insufficient documentation

## 2018-04-02 DIAGNOSIS — Z992 Dependence on renal dialysis: Secondary | ICD-10-CM | POA: Insufficient documentation

## 2018-04-02 DIAGNOSIS — Z8673 Personal history of transient ischemic attack (TIA), and cerebral infarction without residual deficits: Secondary | ICD-10-CM | POA: Diagnosis not present

## 2018-04-02 DIAGNOSIS — Z841 Family history of disorders of kidney and ureter: Secondary | ICD-10-CM | POA: Insufficient documentation

## 2018-04-02 DIAGNOSIS — Z1239 Encounter for other screening for malignant neoplasm of breast: Secondary | ICD-10-CM

## 2018-04-02 DIAGNOSIS — I10 Essential (primary) hypertension: Secondary | ICD-10-CM

## 2018-04-02 DIAGNOSIS — Z8249 Family history of ischemic heart disease and other diseases of the circulatory system: Secondary | ICD-10-CM | POA: Insufficient documentation

## 2018-04-02 DIAGNOSIS — F1911 Other psychoactive substance abuse, in remission: Secondary | ICD-10-CM | POA: Diagnosis not present

## 2018-04-02 DIAGNOSIS — Z886 Allergy status to analgesic agent status: Secondary | ICD-10-CM | POA: Insufficient documentation

## 2018-04-02 DIAGNOSIS — D649 Anemia, unspecified: Secondary | ICD-10-CM | POA: Insufficient documentation

## 2018-04-02 DIAGNOSIS — Z8619 Personal history of other infectious and parasitic diseases: Secondary | ICD-10-CM | POA: Insufficient documentation

## 2018-04-02 DIAGNOSIS — E785 Hyperlipidemia, unspecified: Secondary | ICD-10-CM | POA: Insufficient documentation

## 2018-04-02 DIAGNOSIS — B182 Chronic viral hepatitis C: Secondary | ICD-10-CM | POA: Insufficient documentation

## 2018-04-02 DIAGNOSIS — F17210 Nicotine dependence, cigarettes, uncomplicated: Secondary | ICD-10-CM | POA: Insufficient documentation

## 2018-04-02 DIAGNOSIS — Z1331 Encounter for screening for depression: Secondary | ICD-10-CM | POA: Insufficient documentation

## 2018-04-02 DIAGNOSIS — Z1211 Encounter for screening for malignant neoplasm of colon: Secondary | ICD-10-CM

## 2018-04-02 MED ORDER — NICOTINE 7 MG/24HR TD PT24: 7.0000 mg | MEDICATED_PATCH | Freq: Every day | TRANSDERMAL | 0 refills | Status: DC

## 2018-04-02 MED ORDER — NICOTINE 14 MG/24HR TD PT24: 14.0000 mg | MEDICATED_PATCH | Freq: Every day | TRANSDERMAL | 0 refills | Status: DC

## 2018-04-02 MED ORDER — ASPIRIN EC 81 MG PO TBEC: 81.0000 mg | DELAYED_RELEASE_TABLET | Freq: Every day | ORAL | 1 refills | Status: DC

## 2018-04-02 NOTE — Patient Instructions (Signed)
Start the nicotine patches at the 14 mg daily.  Use it for 1 month and then step down to the 7 mg.  Start the low-dose aspirin 81 mg.  Make sure that you are taking the Protonix daily with this.  If it causes any upset stomach please let me know.  Please remember to get documentation of when you receive the flu vaccine and pneumonia vaccine from your dialysis center.  Please bring that for me on your next appointment.
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
## 2018-04-03 LAB — COMPREHENSIVE METABOLIC PANEL
A/G RATIO: 1.4 (ref 1.2–2.2)
ALT: 17 IU/L (ref 0–32)
AST: 22 IU/L (ref 0–40)
Albumin: 4.3 g/dL (ref 3.6–4.8)
Alkaline Phosphatase: 57 IU/L (ref 39–117)
BUN / CREAT RATIO: 5 — AB (ref 12–28)
BUN: 25 mg/dL (ref 8–27)
Bilirubin Total: 0.3 mg/dL (ref 0.0–1.2)
CALCIUM: 9.9 mg/dL (ref 8.7–10.3)
CHLORIDE: 94 mmol/L — AB (ref 96–106)
CO2: 23 mmol/L (ref 20–29)
Creatinine, Ser: 5.51 mg/dL — ABNORMAL HIGH (ref 0.57–1.00)
GFR, EST AFRICAN AMERICAN: 8 mL/min/{1.73_m2} — AB (ref >59)
GFR, EST NON AFRICAN AMERICAN: 7 mL/min/{1.73_m2} — AB (ref >59)
Globulin, Total: 3.1 g/dL (ref 1.5–4.5)
Glucose: 65 mg/dL (ref 65–99)
POTASSIUM: 3.8 mmol/L (ref 3.5–5.2)
SODIUM: 139 mmol/L (ref 134–144)
TOTAL PROTEIN: 7.4 g/dL (ref 6.0–8.5)

## 2018-04-03 LAB — CBC
HEMOGLOBIN: 13.1 g/dL (ref 11.1–15.9)
Hematocrit: 38 % (ref 34.0–46.6)
MCH: 31.3 pg (ref 26.6–33.0)
MCHC: 34.5 g/dL (ref 31.5–35.7)
MCV: 91 fL (ref 79–97)
Platelets: 312 10*3/uL (ref 150–450)
RBC: 4.18 x10E6/uL (ref 3.77–5.28)
RDW: 14 % (ref 12.3–15.4)
WBC: 8.2 10*3/uL (ref 3.4–10.8)

## 2018-04-03 LAB — LIPID PANEL
Chol/HDL Ratio: 3.1 ratio (ref 0.0–4.4)
Cholesterol, Total: 194 mg/dL (ref 100–199)
HDL: 63 mg/dL (ref >39)
LDL CALC: 104 mg/dL — AB (ref 0–99)
TRIGLYCERIDES: 135 mg/dL (ref 0–149)
VLDL Cholesterol Cal: 27 mg/dL (ref 5–40)

## 2018-04-04 ENCOUNTER — Other Ambulatory Visit: Payer: Self-pay | Admitting: Internal Medicine

## 2018-04-04 MED ORDER — ATORVASTATIN CALCIUM 40 MG PO TABS: 40.0000 mg | ORAL_TABLET | Freq: Every day | ORAL | 5 refills | Status: DC

## 2018-04-05 LAB — HEPATITIS C VRS RNA DETECT BY PCR-QUAL: HCV RNA NAA QUALITATIVE: POSITIVE — AB

## 2018-04-07 ENCOUNTER — Telehealth: Payer: Self-pay

## 2018-04-07 LAB — SPECIMEN STATUS REPORT

## 2018-04-07 NOTE — Telephone Encounter (Signed)
Contacted pt to go over lab results pt is aware and doesn't have any questions or concerns 

## 2018-04-09 ENCOUNTER — Telehealth: Payer: Self-pay | Admitting: Licensed Clinical Social Worker

## 2018-04-09 NOTE — Telephone Encounter (Signed)
LCSWA placed call to patient. LCSWA explained that a referral was completed by PCP to address behavioral health screen from previous appointment.   Pt shared that she has been feeling overwhelmed with her medical conditions, dialysis, and the various appointments. Pt has limited transportation and is unable to meet with LCSWA in the near future. She denied current or hx of SI/HI. Pt has an adult daughter who resides locally and will check on her periodically. She reports feeling safe in her home.  LCSWA discussed therapeutic interventions to maintain motivation, decrease symptoms, and promote relaxation. Supportive resources for food insecurity were discussed and mailed out to pt's updated address.   Pt was strongly encouraged to contact LCSWA with any additional concern or questions. Pt was appreciative for the assistance. No additional concerns noted.

## 2018-04-10 LAB — SPECIMEN STATUS REPORT

## 2018-04-10 LAB — HEPATITIS C GENOTYPE

## 2018-04-10 LAB — HCV RNA QUANT RFLX ULTRA OR GENOTYP
HCV Quant Baseline: 954000 IU/mL
HCV log10: 5.98 log10 IU/mL

## 2018-04-12 ENCOUNTER — Telehealth: Payer: Self-pay

## 2018-04-12 ENCOUNTER — Encounter: Payer: Medicare Other | Admitting: Family

## 2018-04-12 NOTE — Telephone Encounter (Signed)
Contacted pt to go over lab results pt is aware and doesn't have any questions or concerns 

## 2018-04-28 ENCOUNTER — Ambulatory Visit (INDEPENDENT_AMBULATORY_CARE_PROVIDER_SITE_OTHER): Payer: Medicare Other | Admitting: Infectious Diseases

## 2018-04-28 ENCOUNTER — Encounter: Payer: Self-pay | Admitting: Infectious Diseases

## 2018-04-28 VITALS — BP 133/84 | HR 101 | Temp 98.3°F | Wt 148.0 lb

## 2018-04-28 DIAGNOSIS — R21 Rash and other nonspecific skin eruption: Secondary | ICD-10-CM | POA: Diagnosis not present

## 2018-04-28 DIAGNOSIS — B182 Chronic viral hepatitis C: Secondary | ICD-10-CM

## 2018-04-28 NOTE — Patient Instructions (Signed)
Nice to meet you today!    We need to get a little more information about your hepatitis c infection before we start your treatment. I anticipate that we can get you started in a few weeks.    Until we can get you treated would recommend: - condoms with sexual encounters or abstinence - no sharing of razors, toothbrushes or anything that could potentially have blood on it.  - limit alcohol to as little as possible to less than 1 drink a day  - limit tylenol use to less than 2,000 mg daily    We will call you once we get your liver ultrasound and lab results and when to start your Mavyret - this will be 3 pills once daily. Don't miss a single pill!   We will see you back 4 weeks after starting treatment!

## 2018-04-28 NOTE — Assessment & Plan Note (Signed)
Hyperpigmented macules on palms that are suspicious appearing for syphilis. Will check RPR today. Last sexual partner was 1 year ago but does not recall any previous testing.
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
## 2018-04-28 NOTE — Assessment & Plan Note (Signed)
New Patient with Chronic Hepatitis C genotype 2b, treatment naive.  APRI 0.176 FIB-4 1.18 >> both methods of non-invasive liver staging indicate that the patient is statistically very low risk for fibrosis (Metavir F0). However, considering that she has had known diagnosis now for nearly 40 years will proceed with ABD electrography/US.    I discussed with the patient the lab findings that confirm chronic hepatitis C as well as the natural history and progression of disease including about 30% of people who develop cirrhosis of the liver if left untreated and once cirrhosis is established there is a 2-7% risk per year of liver cancer and liver failure.  I discussed the importance of treatment and benefits in reducing the risk, even if significant liver fibrosis exists. I also discussed risk for re-infection following treatment should he not continue to modify risk factors.    Patient counseled extensively on limiting acetaminophen to no more than 2 grams daily, avoidance of alcohol.  Transmission discussed with patient including sexual transmission, sharing razors and toothbrush.   Will need referral to gastroenterology if concern for cirrhosis  Substance abuse is in remission.   Considering ESRD and PPI use will plan for MAVYRET 3 with likely duration of therapy 8 weeks. Will await U/S to be certain.   Hepatitis A titer today. She has immunity to Hep B through previous infection.    Pneumovax and flu vaccine given with HD center.   Further work up to include liver staging   Will call her with results and duration of therapy with Mavyret.

## 2018-04-29 LAB — PROTIME-INR
INR: 1
Prothrombin Time: 10.3 s (ref 9.0–11.5)

## 2018-04-29 LAB — RPR: RPR Ser Ql: NONREACTIVE

## 2018-04-29 LAB — HEPATITIS A ANTIBODY, TOTAL: HEPATITIS A AB,TOTAL: REACTIVE — AB

## 2018-05-05 ENCOUNTER — Ambulatory Visit (HOSPITAL_COMMUNITY): Payer: Medicare Other

## 2018-05-13 ENCOUNTER — Encounter: Payer: Self-pay | Admitting: Internal Medicine

## 2018-05-18 ENCOUNTER — Other Ambulatory Visit: Payer: Self-pay | Admitting: Internal Medicine

## 2018-05-18 DIAGNOSIS — Z13820 Encounter for screening for osteoporosis: Secondary | ICD-10-CM

## 2018-05-18 DIAGNOSIS — Z1231 Encounter for screening mammogram for malignant neoplasm of breast: Secondary | ICD-10-CM

## 2018-05-26 ENCOUNTER — Ambulatory Visit: Payer: Medicare Other | Admitting: Infectious Diseases

## 2018-06-08 ENCOUNTER — Ambulatory Visit: Payer: Medicare Other | Admitting: Internal Medicine

## 2018-06-18 ENCOUNTER — Other Ambulatory Visit (HOSPITAL_COMMUNITY): Payer: Self-pay | Admitting: Nephrology

## 2018-06-18 DIAGNOSIS — N186 End stage renal disease: Secondary | ICD-10-CM

## 2018-06-21 ENCOUNTER — Other Ambulatory Visit: Payer: Self-pay

## 2018-06-21 ENCOUNTER — Other Ambulatory Visit (HOSPITAL_COMMUNITY): Payer: Self-pay | Admitting: Nephrology

## 2018-06-21 ENCOUNTER — Encounter (HOSPITAL_COMMUNITY): Payer: Self-pay

## 2018-06-21 ENCOUNTER — Ambulatory Visit (HOSPITAL_COMMUNITY)
Admission: RE | Admit: 2018-06-21 | Discharge: 2018-06-21 | Disposition: A | Discharge status: Home / Self Care | Payer: Medicare Other | Source: Ambulatory Visit | Attending: Nephrology | Admitting: Nephrology

## 2018-06-21 DIAGNOSIS — Z8673 Personal history of transient ischemic attack (TIA), and cerebral infarction without residual deficits: Secondary | ICD-10-CM | POA: Diagnosis not present

## 2018-06-21 DIAGNOSIS — Y832 Surgical operation with anastomosis, bypass or graft as the cause of abnormal reaction of the patient, or of later complication, without mention of misadventure at the time of the procedure: Secondary | ICD-10-CM | POA: Diagnosis not present

## 2018-06-21 DIAGNOSIS — Z79899 Other long term (current) drug therapy: Secondary | ICD-10-CM | POA: Insufficient documentation

## 2018-06-21 DIAGNOSIS — Z992 Dependence on renal dialysis: Secondary | ICD-10-CM | POA: Insufficient documentation

## 2018-06-21 DIAGNOSIS — T82858A Stenosis of vascular prosthetic devices, implants and grafts, initial encounter: Secondary | ICD-10-CM | POA: Insufficient documentation

## 2018-06-21 DIAGNOSIS — N186 End stage renal disease: Secondary | ICD-10-CM

## 2018-06-21 DIAGNOSIS — Z7982 Long term (current) use of aspirin: Secondary | ICD-10-CM | POA: Insufficient documentation

## 2018-06-21 DIAGNOSIS — Z87891 Personal history of nicotine dependence: Secondary | ICD-10-CM | POA: Diagnosis not present

## 2018-06-21 DIAGNOSIS — I120 Hypertensive chronic kidney disease with stage 5 chronic kidney disease or end stage renal disease: Secondary | ICD-10-CM | POA: Diagnosis not present

## 2018-06-21 DIAGNOSIS — E785 Hyperlipidemia, unspecified: Secondary | ICD-10-CM | POA: Diagnosis not present

## 2018-06-21 DIAGNOSIS — K219 Gastro-esophageal reflux disease without esophagitis: Secondary | ICD-10-CM | POA: Diagnosis not present

## 2018-06-21 HISTORY — PX: IR AV DIALY SHUNT INTRO NEEDLE/INTRACATH INITIAL W/PTA/IMG LEFT: IMG6103

## 2018-06-21 MED ORDER — LIDOCAINE HCL (PF) 1 % IJ SOLN
INTRAMUSCULAR | Status: AC | PRN
  Administered 2018-06-21: 10 mL

## 2018-06-21 MED ORDER — IOPAMIDOL (ISOVUE-300) INJECTION 61%
INTRAVENOUS | Status: AC
  Administered 2018-06-21: 40 mL
  Filled 2018-06-21: qty 100

## 2018-06-21 MED ORDER — SODIUM CHLORIDE 0.9 % IV SOLN: INTRAVENOUS | Status: DC

## 2018-06-21 MED ORDER — LIDOCAINE HCL 1 % IJ SOLN
INTRAMUSCULAR | Status: AC
  Filled 2018-06-21: qty 20

## 2018-06-21 NOTE — Procedures (Signed)
LUE AVFistulagram Venous PTA 7 mm Success EBL 0 Comp 0

## 2018-06-21 NOTE — H&P (Signed)
Chief Complaint: Patient was seen in consultation today for left arm dialysis fistula evaluation with possible angioplasty/stent at the request of [place]  Referring Physician(s): Coladonato,Joseph  Supervising Physician: Marybelle Killings  Patient Status: [facility] - Out-pt  History of Present Illness: Ashley [name] is a 68 y.o. female   Fistula placed 06/16/17 Has used successfully all yr Did have [facility] ago  Successfully completed dialysis Thurs 06/17/18 After dialysis-- fistula swelled and was very painful Swelling has decreased-- pain is less Was unable to dialyze Sat secondary swelling and pain  Request now for fistula evaluation and possible angioplasty/stent placement    Past Medical History:  Diagnosis Date  . Acute kidney injury ([place]) 05/24/2013  . Arthritis   . Cervical radiculopathy 02/28/2011  . Chronic renal failure   . Cocaine substance abuse ([place]) 05/26/2013   positive UDS   . Gastropathy   . GERD (gastroesophageal reflux disease)   . Hepatitis C 1987   history of IVDA  . Hiatal hernia   . Hyperlipidemia   . Hypertension   . Marijuana abuse 12/18/204   positive UDS, family members smoke as well  . Pancreatitis 2000   resolved  . 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 Schatzki's ring   . Stroke ([place])   . Ulcer 1990   gastric ulcer. Ruptured s/p emergency repair    Past Surgical History:  Procedure Laterality Date  . ABDOMINAL HYSTERECTOMY  1979  . AV FISTULA PLACEMENT Left 06/16/2017   Procedure: ARTERIOVENOUS (AV) FISTULA CREATION LEFT ARM;  Surgeon: Conrad Kimberly, MD;  Location: [place];  Service: Vascular;  Laterality: Left;  . BASCILIC VEIN TRANSPOSITION Left 10/02/2017   Procedure: BASILIC VEIN TRANSPOSITION SECOND STAGE LEFT ARM;  Surgeon: Rosetta Posner, MD;  Location: [facility] OR;  Service: Vascular;  Laterality: Left;  . ESOPHAGOGASTRODUODENOSCOPY N/A 05/29/2013   Procedure: ESOPHAGOGASTRODUODENOSCOPY (EGD);   Surgeon: Jerene Bears, MD;  Location: [facility] ENDOSCOPY;  Service: Endoscopy;  Laterality: N/A;  . EXCHANGE OF A DIALYSIS CATHETER Left 07/31/2017   Procedure: Removal  OF A  Right GroinTUNNELED  DIALYSIS CATHETER ,  Insertion of Left Femoral Dialysis Catheter.;  Surgeon: Rosetta Posner, MD;  Location: [place];  Service: Vascular;  Laterality: Left;  . INSERTION OF DIALYSIS CATHETER Right 06/16/2017   Procedure: INSERTION OF DIALYSIS CATHETER;  Surgeon: Conrad Salem, MD;  Location: [place];  Service: Vascular;  Laterality: Right;  . REPAIR OF PERFORATED ULCER      Allergies: Aspirin and Ibuprofen  Medications: Prior to Admission medications   Medication Sig Start Date End Date Taking? Authorizing Provider  amLODipine (NORVASC) 10 MG tablet Take 1 tablet (10 mg total) by mouth daily. 11/11/16  Yes Katheren Shams, DO  aspirin EC 81 MG tablet Take 1 tablet (81 mg total) by mouth daily. 04/02/18  Yes Ladell Pier, MD  atorvastatin (LIPITOR) 40 MG tablet Take 1 tablet (40 mg total) by mouth daily at 6 PM. 04/04/18  Yes Ladell Pier, MD  B Complex-C-Folic Acid (DIALYVITE 867) 0.8 MG TABS Take by mouth.   Yes [provider]  cyclobenzaprine (FLEXERIL) 5 MG tablet Take 5 mg by mouth 3 (three) times daily as needed for muscle spasms.   Yes [provider]  ferric citrate (AURYXIA) 1 GM 210 MG(Fe) tablet Take 210 mg by mouth 2 (two) times daily with a meal.   Yes [provider]  hydrALAZINE (APRESOLINE) 25 MG tablet Take  1 tablet (25 mg total) by mouth every 8 (eight) hours. 07/02/17  Yes Rumball, Alison, DO  Lidocaine-Prilocaine, Bulk, 2.5-2.5 % CREA by Does not apply route. Apply 1 a small amount to skin 3 times a week apply to AVF 60 min prior to dialysis, wrap site w plastic wrap q tx   Yes [provider]  pantoprazole (PROTONIX) 40 MG tablet Take 1 tablet (40 mg total) by mouth daily. 07/02/17  Yes Rory Percy, DO     Family History  Problem Relation Age  of Onset  . Hypertension Father   . Cancer Father   . Hyperlipidemia Father   . Seizures Sister   . Early death Daughter   . Kidney disease Daughter        end stage dialysis dependent     Social History   Socioeconomic History  . Marital status: Widowed    Spouse name: Not on file  . Number of children: Not on file  . Years of education: Not on file  . Highest education level: Not on file  Occupational History  . Not on file  Social Needs  . Financial resource strain: Not on file  . Food insecurity:    Worry: Not on file    Inability: Not on file  . Transportation needs:    Medical: Not on file    Non-medical: Not on file  Tobacco Use  . Smoking status: Former Smoker    Packs/day: 0.25    Years: 40.00    Pack years: 10.00    Types: Cigarettes  . Smokeless tobacco: Never Used  . Tobacco comment: wears a patch  Substance and Sexual Activity  . Alcohol use: No    Alcohol/week: 0.0 standard drinks  . Drug use: Yes    Types: Heroin, Marijuana    Comment: hasn't used in months  . Sexual activity: Never  Lifestyle  . Physical activity:    Days per week: Not on file    Minutes per session: Not on file  . Stress: Not on file  Relationships  . Social connections:    Talks on phone: Not on file    Gets together: Not on file    Attends religious service: Not on file    Active member of club or organization: Not on file    Attends meetings of clubs or organizations: Not on file    Relationship status: Not on file  Other Topics Concern  . Not on file  Social History Narrative  . Not on file     Review of Systems: A 12 point ROS discussed and pertinent positives are indicated in the HPI above.  All other systems are negative.  Review of Systems  Constitutional: Negative for activity change, fatigue and fever.  Respiratory: Negative for cough and shortness of breath.   Cardiovascular: Negative for chest pain.  Gastrointestinal: Negative for abdominal pain.    Musculoskeletal: Negative for back pain.  Psychiatric/Behavioral: Negative for behavioral problems and confusion.    Vital Signs: BP (!) 165/77   Pulse 87   Temp 97.8 F (36.6 C) (Oral)   Ht 5\' 2"  (1.575 m)   Wt 150 lb (68 kg)   SpO2 100%   BMI 27.44 kg/m   Physical Exam Vitals signs reviewed.  Cardiovascular:     Rate and Rhythm: Normal rate and regular rhythm.     Heart sounds: Normal heart sounds.  Pulmonary:     Effort: Pulmonary effort is normal.  Breath sounds: Normal breath sounds.  Abdominal:     General: Bowel sounds are normal.     Palpations: Abdomen is soft.     Tenderness: There is no abdominal tenderness.  Musculoskeletal: Normal range of motion.     Comments: Left upper arm fistula Minimal swelling noted Tender to touch + pulses No thrill  Skin:    General: Skin is warm and dry.  Neurological:     Mental Status: She is alert.     Imaging: No results found.  Labs:  CBC: Recent Labs    06/30/17 0721 07/01/17 1020 07/02/17 1049 07/31/17 0808 07/31/17 0824 10/02/17 1039 04/02/18 1058  WBC 8.3 8.2 8.8  --   --   --  8.2  HGB 7.1* 7.6* 7.0* 9.5* 8.8* 15.6* 13.1  HCT 21.2* 23.2* 21.5* 28.0* 26.0* 46.0 38.0  PLT 408* 375 390  --   --   --  312    COAGS: Recent Labs    04/28/18 1432  INR 1.0    BMP: Recent Labs    06/30/17 0721 07/01/17 0523 07/02/17 1049 07/31/17 0808 07/31/17 0824 10/02/17 1039 04/02/18 1058  NA 134* 136 132* 134* 138 137 139  K 6.7* 4.1 4.3 7.6* 3.7 4.2 3.8  CL 101 100* 98*  --   --   --  94*  CO2 21* 24 22  --   --   --  23  GLUCOSE 87 90 125* 87 89 87 65  BUN 82* 38* 63*  --   --   --  25  CALCIUM 8.1* 8.6* 8.2*  --   --   --  9.9  CREATININE 6.33* 3.91* 5.24*  --   --   --  5.51*  GFRNONAA 6* 11* 8*  --   --   --  7*  GFRAA 7* 13* 9*  --   --   --  8*    LIVER FUNCTION TESTS: Recent Labs    06/30/17 0721 07/01/17 0523 07/02/17 1049 04/02/18 1058  BILITOT  --   --   --  0.3  AST  --   --    --  22  ALT  --   --   --  17  ALKPHOS  --   --   --  57  PROT  --   --   --  7.4  ALBUMIN 2.3* 2.4* 2.3* 4.3    TUMOR MARKERS: No results for input(s): AFPTM, CEA, CA199, CHROMGRNA in the last 8760 hours.  Assessment and Plan:  Left arm fistula swelling and pain Good pulse-- no thrill For evaluation and possible angioplasty stent placement Pt is aware of procedure benefits and risks including but not limited to Infection; bleeding; damage to fistula Agreeable to proceed Consent signed and in chart   Thank you for this interesting consult.  I greatly enjoyed meeting BENJAMIN MERRIHEW and look forward to participating in their care.  A copy of this report was sent to the requesting provider on this date.  Electronically Signed: Lavonia Drafts, PA-C 06/21/2018, 9:32 AM   I spent a total of  30 Minutes   in face to face in clinical consultation, greater than 50% of which was counseling/coordinating care for left arm fistula evaluation

## 2018-07-05 ENCOUNTER — Ambulatory Visit (HOSPITAL_COMMUNITY): Payer: Medicare Other

## 2018-07-12 ENCOUNTER — Ambulatory Visit: Payer: Medicare Other | Admitting: Infectious Diseases

## 2018-07-14 ENCOUNTER — Other Ambulatory Visit: Payer: Medicare Other

## 2018-07-14 ENCOUNTER — Ambulatory Visit: Payer: Medicare Other

## 2018-07-21 ENCOUNTER — Other Ambulatory Visit: Payer: Medicare Other

## 2018-08-09 ENCOUNTER — Ambulatory Visit: Payer: Medicare Other

## 2018-09-15 ENCOUNTER — Other Ambulatory Visit: Payer: Medicare Other

## 2018-09-15 ENCOUNTER — Ambulatory Visit: Payer: Medicare Other

## 2018-10-14 ENCOUNTER — Other Ambulatory Visit: Payer: Self-pay

## 2018-10-14 ENCOUNTER — Observation Stay (HOSPITAL_COMMUNITY): Payer: Medicare Other

## 2018-10-14 ENCOUNTER — Inpatient Hospital Stay (HOSPITAL_COMMUNITY)
Admission: EM | Admit: 2018-10-14 | Discharge: 2018-10-16 | DRG: 438 | Disposition: A | Discharge status: Home / Self Care | Payer: Medicare Other | Attending: Internal Medicine | Admitting: Internal Medicine

## 2018-10-14 ENCOUNTER — Encounter (HOSPITAL_COMMUNITY): Payer: Self-pay

## 2018-10-14 ENCOUNTER — Emergency Department (HOSPITAL_COMMUNITY): Payer: Medicare Other

## 2018-10-14 DIAGNOSIS — E8889 Other specified metabolic disorders: Secondary | ICD-10-CM | POA: Diagnosis present

## 2018-10-14 DIAGNOSIS — Z841 Family history of disorders of kidney and ureter: Secondary | ICD-10-CM

## 2018-10-14 DIAGNOSIS — Z8349 Family history of other endocrine, nutritional and metabolic diseases: Secondary | ICD-10-CM | POA: Diagnosis not present

## 2018-10-14 DIAGNOSIS — Z886 Allergy status to analgesic agent status: Secondary | ICD-10-CM

## 2018-10-14 DIAGNOSIS — E861 Hypovolemia: Secondary | ICD-10-CM | POA: Diagnosis present

## 2018-10-14 DIAGNOSIS — Z7982 Long term (current) use of aspirin: Secondary | ICD-10-CM

## 2018-10-14 DIAGNOSIS — I10 Essential (primary) hypertension: Secondary | ICD-10-CM | POA: Diagnosis present

## 2018-10-14 DIAGNOSIS — Z87891 Personal history of nicotine dependence: Secondary | ICD-10-CM | POA: Diagnosis not present

## 2018-10-14 DIAGNOSIS — Z992 Dependence on renal dialysis: Secondary | ICD-10-CM

## 2018-10-14 DIAGNOSIS — I120 Hypertensive chronic kidney disease with stage 5 chronic kidney disease or end stage renal disease: Secondary | ICD-10-CM | POA: Diagnosis present

## 2018-10-14 DIAGNOSIS — R109 Unspecified abdominal pain: Secondary | ICD-10-CM | POA: Diagnosis not present

## 2018-10-14 DIAGNOSIS — B182 Chronic viral hepatitis C: Secondary | ICD-10-CM | POA: Diagnosis present

## 2018-10-14 DIAGNOSIS — Z8711 Personal history of peptic ulcer disease: Secondary | ICD-10-CM | POA: Diagnosis not present

## 2018-10-14 DIAGNOSIS — Z79899 Other long term (current) drug therapy: Secondary | ICD-10-CM

## 2018-10-14 DIAGNOSIS — N2581 Secondary hyperparathyroidism of renal origin: Secondary | ICD-10-CM | POA: Diagnosis present

## 2018-10-14 DIAGNOSIS — K219 Gastro-esophageal reflux disease without esophagitis: Secondary | ICD-10-CM | POA: Diagnosis present

## 2018-10-14 DIAGNOSIS — N2889 Other specified disorders of kidney and ureter: Secondary | ICD-10-CM | POA: Diagnosis present

## 2018-10-14 DIAGNOSIS — K279 Peptic ulcer, site unspecified, unspecified as acute or chronic, without hemorrhage or perforation: Secondary | ICD-10-CM | POA: Diagnosis not present

## 2018-10-14 DIAGNOSIS — D631 Anemia in chronic kidney disease: Secondary | ICD-10-CM | POA: Diagnosis present

## 2018-10-14 DIAGNOSIS — N186 End stage renal disease: Secondary | ICD-10-CM | POA: Diagnosis present

## 2018-10-14 DIAGNOSIS — Z90710 Acquired absence of both cervix and uterus: Secondary | ICD-10-CM

## 2018-10-14 DIAGNOSIS — K859 Acute pancreatitis without necrosis or infection, unspecified: Secondary | ICD-10-CM | POA: Diagnosis not present

## 2018-10-14 DIAGNOSIS — Z8673 Personal history of transient ischemic attack (TIA), and cerebral infarction without residual deficits: Secondary | ICD-10-CM

## 2018-10-14 DIAGNOSIS — E785 Hyperlipidemia, unspecified: Secondary | ICD-10-CM | POA: Diagnosis present

## 2018-10-14 DIAGNOSIS — Z8249 Family history of ischemic heart disease and other diseases of the circulatory system: Secondary | ICD-10-CM

## 2018-10-14 LAB — COMPREHENSIVE METABOLIC PANEL
ALT: 16 U/L (ref 0–44)
AST: 14 U/L — ABNORMAL LOW (ref 15–41)
Albumin: 3.5 g/dL (ref 3.5–5.0)
Alkaline Phosphatase: 51 U/L (ref 38–126)
Anion gap: 15 (ref 5–15)
BUN: 69 mg/dL — ABNORMAL HIGH (ref 8–23)
CO2: 22 mmol/L (ref 22–32)
Calcium: 9.9 mg/dL (ref 8.9–10.3)
Chloride: 104 mmol/L (ref 98–111)
Creatinine, Ser: 12.82 mg/dL — ABNORMAL HIGH (ref 0.44–1.00)
GFR calc Af Amer: 3 mL/min — ABNORMAL LOW (ref >60)
GFR calc non Af Amer: 3 mL/min — ABNORMAL LOW (ref >60)
Glucose, Bld: 97 mg/dL (ref 70–99)
Potassium: 4.4 mmol/L (ref 3.5–5.1)
Sodium: 141 mmol/L (ref 135–145)
Total Bilirubin: 0.6 mg/dL (ref 0.3–1.2)
Total Protein: 7.3 g/dL (ref 6.5–8.1)

## 2018-10-14 LAB — RAPID URINE DRUG SCREEN, HOSP PERFORMED
Amphetamines: NOT DETECTED
Barbiturates: NOT DETECTED
Benzodiazepines: NOT DETECTED
Cocaine: NOT DETECTED
Opiates: NOT DETECTED
Tetrahydrocannabinol: POSITIVE — AB

## 2018-10-14 LAB — CBC WITH DIFFERENTIAL/PLATELET
Abs Immature Granulocytes: 0.05 10*3/uL (ref 0.00–0.07)
Basophils Absolute: 0 10*3/uL (ref 0.0–0.1)
Basophils Relative: 0 %
Eosinophils Absolute: 0.3 10*3/uL (ref 0.0–0.5)
Eosinophils Relative: 2 %
HCT: 36.1 % (ref 36.0–46.0)
Hemoglobin: 11.9 g/dL — ABNORMAL LOW (ref 12.0–15.0)
Immature Granulocytes: 0 %
Lymphocytes Relative: 25 %
Lymphs Abs: 2.9 10*3/uL (ref 0.7–4.0)
MCH: 29.2 pg (ref 26.0–34.0)
MCHC: 33 g/dL (ref 30.0–36.0)
MCV: 88.7 fL (ref 80.0–100.0)
Monocytes Absolute: 1.2 10*3/uL — ABNORMAL HIGH (ref 0.1–1.0)
Monocytes Relative: 10 %
Neutro Abs: 7 10*3/uL (ref 1.7–7.7)
Neutrophils Relative %: 63 %
Platelets: 283 10*3/uL (ref 150–400)
RBC: 4.07 MIL/uL (ref 3.87–5.11)
RDW: 18.1 % — ABNORMAL HIGH (ref 11.5–15.5)
WBC: 11.4 10*3/uL — ABNORMAL HIGH (ref 4.0–10.5)
nRBC: 0 % (ref 0.0–0.2)

## 2018-10-14 LAB — MRSA PCR SCREENING: MRSA by PCR: NEGATIVE

## 2018-10-14 LAB — HIV ANTIBODY (ROUTINE TESTING W REFLEX): HIV Screen 4th Generation wRfx: NONREACTIVE

## 2018-10-14 LAB — LACTIC ACID, PLASMA: Lactic Acid, Venous: 0.8 mmol/L (ref 0.5–1.9)

## 2018-10-14 LAB — LIPID PANEL
Cholesterol: 177 mg/dL (ref 0–200)
HDL: 61 mg/dL (ref >40)
LDL Cholesterol: 97 mg/dL (ref 0–99)
Total CHOL/HDL Ratio: 2.9 RATIO
Triglycerides: 96 mg/dL (ref <150)
VLDL: 19 mg/dL (ref 0–40)

## 2018-10-14 LAB — LIPASE, BLOOD: Lipase: 179 U/L — ABNORMAL HIGH (ref 11–51)

## 2018-10-14 MED ORDER — ASPIRIN EC 81 MG PO TBEC
81.0000 mg | DELAYED_RELEASE_TABLET | Freq: Every day | ORAL | Status: DC
  Administered 2018-10-14 – 2018-10-16 (×2): 81 mg via ORAL
  Filled 2018-10-14 (×3): qty 1

## 2018-10-14 MED ORDER — ONDANSETRON HCL 4 MG/2ML IJ SOLN: 4.0000 mg | Freq: Four times a day (QID) | INTRAMUSCULAR | Status: DC | PRN

## 2018-10-14 MED ORDER — DOXERCALCIFEROL 4 MCG/2ML IV SOLN
4.0000 ug | INTRAVENOUS | Status: DC
  Administered 2018-10-16: 11:00:00 4 ug via INTRAVENOUS
  Filled 2018-10-14: qty 2

## 2018-10-14 MED ORDER — ONDANSETRON HCL 4 MG/2ML IJ SOLN
4.0000 mg | Freq: Once | INTRAMUSCULAR | Status: AC
  Administered 2018-10-14: 4 mg via INTRAVENOUS
  Filled 2018-10-14: qty 2

## 2018-10-14 MED ORDER — FENTANYL CITRATE (PF) 100 MCG/2ML IJ SOLN
50.0000 ug | Freq: Once | INTRAMUSCULAR | Status: AC
  Administered 2018-10-14: 50 ug via INTRAVENOUS
  Filled 2018-10-14: qty 2

## 2018-10-14 MED ORDER — ACETAMINOPHEN 325 MG PO TABS: 650.0000 mg | ORAL_TABLET | Freq: Four times a day (QID) | ORAL | Status: DC | PRN

## 2018-10-14 MED ORDER — IOHEXOL 300 MG/ML  SOLN
100.0000 mL | Freq: Once | INTRAMUSCULAR | Status: AC | PRN
  Administered 2018-10-14: 100 mL via INTRAVENOUS

## 2018-10-14 MED ORDER — SODIUM CHLORIDE 0.9 % IV BOLUS
500.0000 mL | Freq: Once | INTRAVENOUS | Status: AC
  Administered 2018-10-14: 500 mL via INTRAVENOUS

## 2018-10-14 MED ORDER — HEPARIN SODIUM (PORCINE) 5000 UNIT/ML IJ SOLN
5000.0000 [IU] | Freq: Three times a day (TID) | INTRAMUSCULAR | Status: DC
  Administered 2018-10-14 – 2018-10-16 (×7): 5000 [IU] via SUBCUTANEOUS
  Filled 2018-10-14 (×6): qty 1

## 2018-10-14 MED ORDER — ATORVASTATIN CALCIUM 40 MG PO TABS
40.0000 mg | ORAL_TABLET | Freq: Every day | ORAL | Status: DC
  Administered 2018-10-14 – 2018-10-16 (×3): 40 mg via ORAL
  Filled 2018-10-14 (×3): qty 1

## 2018-10-14 MED ORDER — ONDANSETRON HCL 4 MG PO TABS: 4.0000 mg | ORAL_TABLET | Freq: Four times a day (QID) | ORAL | Status: DC | PRN

## 2018-10-14 MED ORDER — PANTOPRAZOLE SODIUM 40 MG PO TBEC
40.0000 mg | DELAYED_RELEASE_TABLET | Freq: Every day | ORAL | Status: DC
  Administered 2018-10-14 – 2018-10-16 (×3): 40 mg via ORAL
  Filled 2018-10-14 (×3): qty 1

## 2018-10-14 MED ORDER — OXYCODONE HCL 5 MG PO TABS
5.0000 mg | ORAL_TABLET | ORAL | Status: DC | PRN
  Administered 2018-10-14 – 2018-10-16 (×9): 5 mg via ORAL
  Filled 2018-10-14 (×9): qty 1

## 2018-10-14 MED ORDER — HYDROMORPHONE HCL 1 MG/ML IJ SOLN
0.5000 mg | INTRAMUSCULAR | Status: DC | PRN
  Administered 2018-10-14 (×3): 1 mg via INTRAVENOUS
  Filled 2018-10-14 (×3): qty 1

## 2018-10-14 MED ORDER — ACETAMINOPHEN 650 MG RE SUPP: 650.0000 mg | Freq: Four times a day (QID) | RECTAL | Status: DC | PRN

## 2018-10-14 MED ORDER — AMLODIPINE BESYLATE 10 MG PO TABS
10.0000 mg | ORAL_TABLET | Freq: Every day | ORAL | Status: DC
  Administered 2018-10-14 – 2018-10-16 (×3): 10 mg via ORAL
  Filled 2018-10-14 (×3): qty 1

## 2018-10-14 MED ORDER — HYDRALAZINE HCL 25 MG PO TABS
25.0000 mg | ORAL_TABLET | Freq: Three times a day (TID) | ORAL | Status: DC
  Administered 2018-10-14 – 2018-10-16 (×7): 25 mg via ORAL
  Filled 2018-10-14 (×7): qty 1

## 2018-10-14 NOTE — Progress Notes (Signed)
Initial Nutrition Assessment  DOCUMENTATION CODES:   Not applicable  INTERVENTION:   Once diet advanced    Nepro Shake po BID, each supplement provides 425 kcal and 19 grams protein  Provide renal MVI daily  NUTRITION DIAGNOSIS:   Increased nutrient needs related to chronic illness(ESRD on HD) as evidenced by estimated needs.  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 Presents this admission with acute pancreatitis.    RD working remotely.  Last HD on 5/2 outpatient, missed treatment on 5/5 due to abdominal pain. Plan for inpatient HD today.   Attempted to speak with pt via phone, no answer. Will need to obtain nutrition/weight history if possible. In previous admissions, pt consumed Nepro shakes. Will provide these once diet is advanced as she is currently NPO.   Outpatient EDW charted as 65.5 kg. Current wt looks to be stated at 65.8 kg. Will need to obtain actual weight to monitor trends.   Medications: hectorol  Labs: lipase 179 (H)   Diet Order:   Diet Order            Diet NPO time specified Except for: Sips with Meds  Diet effective now              EDUCATION NEEDS:   Not appropriate for education at this time  Skin:  Skin Assessment: Reviewed RN Assessment  Last BM:  5/6  Height:   Ht Readings from Last 1 Encounters:  10/14/18 5\' 2"  (1.575 m)    Weight:   Wt Readings from Last 1 Encounters:  10/14/18 65.8 kg    Ideal Body Weight:  50 kg  BMI:  Body mass index is 26.52 kg/m.  Estimated Nutritional Needs:   Kcal:  1800-2000 kcal  Protein:  90-110 grams  Fluid:  1000 ml +  UOP   Mariana Single RD, LDN Clinical Nutrition Pager # [phone]

## 2018-10-14 NOTE — ED Notes (Signed)
ED TO INPATIENT HANDOFF REPORT  ED Nurse Name and Phone #: [phone], Tray [place]  S Name/Age/Gender Ashley [name] 68 y.o. female Room/Bed: 029C/029C  Code Status   Code Status: Full Code  Home/SNF/Other Home Patient oriented to: self, place, time and situation Is this baseline? Yes   Triage Complete: Triage complete  Chief Complaint Abd Pain  Triage Note Pt arrived via [facility]; pt from hm with c/o L sided abd pain; pt is dialysis pt, restricted on the L side, lst dialysis was Sat; denies n/v/d; 150/100, 100, CBG 100, 16, 98 % on RA   Allergies Allergies  Allergen Reactions  . Aspirin Nausea And Vomiting    Stomach ache  . Ibuprofen Nausea And Vomiting    Stomach ache    Level of Care/Admitting Diagnosis ED Disposition    ED Disposition Condition Comment   Admit  Hospital Area: [facility] [[id number]]  Level of Care: Med-Surg [16]  I expect the patient will be discharged within 24 hours: No (not a candidate for 5C-Observation unit)  Covid Evaluation: N/A  Diagnosis: Acute pancreatitis [577.0.ICD-9-CM]  Admitting Physician: Lenore Cordia [[id number]]  Attending Physician: Lenore Cordia [[id number]]  PT Class (Do Not Modify): Observation [104]  PT Acc Code (Do Not Modify): Observation [10022]       B Medical/Surgery History Past Medical History:  Diagnosis Date  . Acute kidney injury ([place]) 05/24/2013  . Arthritis   . Cervical radiculopathy 02/28/2011  . Chronic renal failure   . Cocaine substance abuse ([place]) 05/26/2013   positive UDS   . Gastropathy   . GERD (gastroesophageal reflux disease)   . Hepatitis C 1987   history of IVDA  . Hiatal hernia   . Hyperlipidemia   . Hypertension   . Marijuana abuse 12/18/204   positive UDS, family members smoke as well  . Pancreatitis 2000   resolved  . 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 Schatzki's ring   . Stroke ([place])   . Ulcer 1990   gastric ulcer. Ruptured s/p emergency repair   Past  Surgical History:  Procedure Laterality Date  . ABDOMINAL HYSTERECTOMY  1979  . AV FISTULA PLACEMENT Left 06/16/2017   Procedure: ARTERIOVENOUS (AV) FISTULA CREATION LEFT ARM;  Surgeon: Conrad Gordon, MD;  Location: [place];  Service: Vascular;  Laterality: Left;  . BASCILIC VEIN TRANSPOSITION Left 10/02/2017   Procedure: BASILIC VEIN TRANSPOSITION SECOND STAGE LEFT ARM;  Surgeon: Rosetta Posner, MD;  Location[facility] OR;  Service: Vascular;  Laterality: Left;  . ESOPHAGOGASTRODUODENOSCOPY N/A 05/29/2013   Procedure: ESOPHAGOGASTRODUODENOSCOPY (EGD);  Surgeon: Jerene Bears, MD;  Location[facility] ENDOSCOPY;  Service: Endoscopy;  Laterality: N/A;  . EXCHANGE OF A DIALYSIS CATHETER Left 07/31/2017   Procedure: Removal  OF A  Right GroinTUNNELED  DIALYSIS CATHETER ,  Insertion of Left Femoral Dialysis Catheter.;  Surgeon: Rosetta Posner, MD;  Location: [place];  Service: Vascular;  Laterality: Left;  . INSERTION OF DIALYSIS CATHETER Right 06/16/2017   Procedure: INSERTION OF DIALYSIS CATHETER;  Surgeon: Conrad Callaway, MD;  Location: [place];  Service: Vascular;  Laterality: Right;  . IR AV DIALY SHUNT INTRO [place] W/PTA/IMG LEFT  06/21/2018  . REPAIR OF PERFORATED ULCER       A IV Location/Drains/Wounds Patient Lines/Drains/Airways Status   Active Line/Drains/Airways    Name:   Placement date:   Placement time:   Site:   Days:   Peripheral IV 07/31/17 Posterior;Right Hand  07/31/17    0806    Hand   440   Peripheral IV 10/14/18 Right Forearm   10/14/18    0153    Forearm   less than 1   Fistula / Graft Left Upper arm Arteriovenous fistula   06/16/17    1245    Upper arm   485   Hemodialysis Catheter Left Femoral vein Double-lumen   07/31/17    0950    Femoral vein   440   Incision (Closed) 06/16/17 Groin Right   06/16/17    1116     485   Incision (Closed) 06/16/17 Arm Left   06/16/17    1226     485   Incision (Closed) 07/31/17 Groin Left   07/31/17    1005     440   Incision (Closed) 07/31/17  Groin Right   07/31/17    1005     440   Incision (Closed) 10/02/17 Arm Left   10/02/17    1400     377          Intake/Output Last 24 hours No intake or output data in the 24 hours ending 10/14/18 0549  Labs/Imaging Results for orders placed or performed during the hospital encounter of 10/14/18 (from the past 48 hour(s))  CBC with Differential/Platelet     Status: Abnormal   Collection Time: 10/14/18  1:56 AM  Result Value Ref Range   WBC 11.4 (H) 4.0 - 10.5 K/uL   RBC 4.07 3.87 - 5.11 MIL/uL   Hemoglobin 11.9 (L) 12.0 - 15.0 g/dL   HCT 36.1 36.0 - 46.0 %   MCV 88.7 80.0 - 100.0 fL   MCH 29.2 26.0 - 34.0 pg   MCHC 33.0 30.0 - 36.0 g/dL   RDW 18.1 (H) 11.5 - 15.5 %   Platelets 283 150 - 400 K/uL   nRBC 0.0 0.0 - 0.2 %   Neutrophils Relative % 63 %   Neutro Abs 7.0 1.7 - 7.7 K/uL   Lymphocytes Relative 25 %   Lymphs Abs 2.9 0.7 - 4.0 K/uL   Monocytes Relative 10 %   Monocytes Absolute 1.2 (H) 0.1 - 1.0 K/uL   Eosinophils Relative 2 %   Eosinophils Absolute 0.3 0.0 - 0.5 K/uL   Basophils Relative 0 %   Basophils Absolute 0.0 0.0 - 0.1 K/uL   Immature Granulocytes 0 %   Abs Immature Granulocytes 0.05 0.00 - 0.07 K/uL    Comment: Performed at [facility], [address], [place], [place] [address]  Comprehensive metabolic panel     Status: Abnormal   Collection Time: 10/14/18  1:56 AM  Result Value Ref Range   Sodium 141 135 - 145 mmol/L   Potassium 4.4 3.5 - 5.1 mmol/L   Chloride 104 98 - 111 mmol/L   CO2 22 22 - 32 mmol/L   Glucose, Bld 97 70 - 99 mg/dL   BUN 69 (H) 8 - 23 mg/dL   Creatinine, Ser 12.82 (H) 0.44 - 1.00 mg/dL   Calcium 9.9 8.9 - 10.3 mg/dL   Total Protein 7.3 6.5 - 8.1 g/dL   Albumin 3.5 3.5 - 5.0 g/dL   AST 14 (L) 15 - 41 U/L   ALT 16 0 - 44 U/L   Alkaline Phosphatase 51 38 - 126 U/L   Total Bilirubin 0.6 0.3 - 1.2 mg/dL   GFR calc non Af Amer 3 (L) >60 mL/min   GFR calc Af Amer 3 (  L) >60 mL/min   Anion gap 15 5 - 15    Comment:  Performed at [facility] [address], [place], [place] [address]  Lipase, blood     Status: Abnormal   Collection Time: 10/14/18  1:56 AM  Result Value Ref Range   Lipase 179 (H) 11 - 51 U/L    Comment: Performed at [facility], [place] [address] [place], [place] [address]  Lactic acid, plasma     Status: None   Collection Time: 10/14/18  1:56 AM  Result Value Ref Range   Lactic Acid, Venous 0.8 0.5 - 1.9 mmol/L    Comment: Performed at [facility] [address], [place], [place] [address]   Ct Abdomen Pelvis W Contrast  Result Date: 10/14/2018 CLINICAL DATA:  68 y/o  F; left-sided abdominal pain for 3 days. EXAM: CT ABDOMEN AND PELVIS WITH CONTRAST TECHNIQUE: Multidetector CT imaging of the abdomen and pelvis was performed using the standard protocol following bolus administration of intravenous contrast. CONTRAST:  155m OMNIPAQUE IOHEXOL 300 MG/ML  SOLN COMPARISON:  05/26/2013 CT abdomen and pelvis. 06/14/2017 lumbar spine MRI. 06/14/2018 renal ultrasound. FINDINGS: Lower chest: Small posterior left diaphragmatic hernia containing fat is stable. Stable 3 mm nodule a peripheral right lower lobe compatible with benign etiology. Hepatobiliary: No focal liver abnormality is seen. No gallstones, gallbladder wall thickening, or biliary dilatation. Pancreas: Mild edema surrounding the body and tail of pancreas. No findings of pancreatic necrosis or acute peripancreatic collection. Spleen: Normal in size without focal abnormality. Adrenals/Urinary Tract: Adrenal glands are unremarkable. Bilateral renal atrophy. Small cysts are present in kidneys bilaterally. No hydronephrosis or urinary stone. Normal bladder. New indeterminate 10 mm 41 HU nodule within the dorsal aspect of left mid kidney (series 3, image 15). Stomach/Bowel: Stomach is within normal limits. Appendix appears normal. No evidence of bowel wall thickening, distention, or inflammatory changes. Vascular/Lymphatic: Aortic  atherosclerosis. No enlarged abdominal or pelvic lymph nodes. Reproductive: Status post hysterectomy. No adnexal masses. Other: No abdominal wall hernia or abnormality. No abdominopelvic ascites. Musculoskeletal: No fracture is seen. IMPRESSION: 1. Mild edema surrounding the body and tail of pancreas compatible with acute pancreatitis in the appropriate clinical setting. No evidence for pancreatic necrosis or acute peripancreatic collection. 2. New indeterminate 10 mm nodule within the dorsal aspect of left mid kidney. Further characterization with renal protocol CT or MRI is recommended. This recommendation follows ACR consensus guidelines: Management of Incidental Renal Mass on CT: A White Paper of the ACR Incidental Findings Committee. J Am Coll Radiol 2017; article in press. 3. Aortic Atherosclerosis (ICD10-I70.0). Electronically Signed   By: LKristine GarbeM.D.   On: 10/14/2018 03:42    Pending Labs Unresulted Labs (From admission, onward)    Start     Ordered   10/15/18 0500  CBC  Tomorrow morning,   R     10/14/18 0501   10/15/18 0500  Comprehensive metabolic panel  Tomorrow morning,   R     10/14/18 0501   10/14/18 0500  HIV antibody (Routine Testing)  Add-on,   R     10/14/18 0501          Vitals/Pain Today's Vitals   10/14/18 0150 10/14/18 0151 10/14/18 0156  BP:   (!) 155/81  Pulse:   82  Resp:   18  Temp:   98.5 F (36.9 C)  TempSrc:   Oral  SpO2:   99%  Weight:  65.8 kg  Height:  '5\' 2"'  (1.575 m)   PainSc: 10-Worst pain ever      Isolation Precautions No active isolations  Medications Medications  heparin injection 5,000 Units (has no administration in time range)  acetaminophen (TYLENOL) tablet 650 mg (has no administration in time range)    Or  acetaminophen (TYLENOL) suppository 650 mg (has no administration in time range)  ondansetron (ZOFRAN) tablet 4 mg (has no administration in time range)    Or  ondansetron (ZOFRAN) injection 4 mg (has no  administration in time range)  sodium chloride 0.9 % bolus 500 mL (has no administration in time range)  HYDROmorphone (DILAUDID) injection 0.5-1 mg (has no administration in time range)  oxyCODONE (Oxy IR/ROXICODONE) immediate release tablet 5 mg (has no administration in time range)  amLODipine (NORVASC) tablet 10 mg (has no administration in time range)  aspirin EC tablet 81 mg (has no administration in time range)  atorvastatin (LIPITOR) tablet 40 mg (has no administration in time range)  hydrALAZINE (APRESOLINE) tablet 25 mg (has no administration in time range)  pantoprazole (PROTONIX) EC tablet 40 mg (has no administration in time range)  fentaNYL (SUBLIMAZE) injection 50 mcg (50 mcg Intravenous Given 10/14/18 0204)  ondansetron (ZOFRAN) injection 4 mg (4 mg Intravenous Given 10/14/18 0204)  iohexol (OMNIPAQUE) 300 MG/ML solution 100 mL (100 mLs Intravenous Contrast Given 10/14/18 0316)  fentaNYL (SUBLIMAZE) injection 50 mcg (50 mcg Intravenous Given 10/14/18 0422)    Mobility walks Low fall risk   Focused Assessments GI   R Recommendations: See Admitting Provider Note  Report given to:   Additional Notes:

## 2018-10-14 NOTE — ED Triage Notes (Signed)
Pt arrived via [facility]; pt from hm with c/o L sided abd pain; pt is dialysis pt, restricted on the L side, lst dialysis was Sat; denies n/v/d; 150/100, 100, CBG 100, 16, 98 % on RA

## 2018-10-14 NOTE — H&P (Signed)
History and Physical    WYNNE ROZAK OIN:[id number] DOB: January 27, 1951 DOA: 10/14/2018  PCP: Ladell Pier, MD  Patient coming from: Home  I have personally briefly reviewed patient's old medical records in [place]  Chief Complaint: Abdominal pain  HPI: Ashley [name] is a 68 y.o. female with medical history significant for ESRD on TTS HD, history of CVA, hypertension, hyperlipidemia, chronic hepatitis C, peptic ulcer disease, and history of substance use who presents to the ED for evaluation of abdominal pain.  Patient states she had new onset abdominal pain across her upper abdomen beginning 3 to 4 days ago.  Symptoms have been persistent and she has been unable to maintain adequate oral intake due to fear of exacerbating her pain.  She thinks this may be related to eating some takeout tacos for 5 days ago however denies any nausea, vomiting, diarrhea or similar symptoms in family members were also ate tacos.  She has not had any fevers, chills, diaphoresis, chest pain, dyspnea, or swelling in her feet or legs.  She says she still has some urine output.  She denies any recent alcohol use.  Her last dialysis was on 10/10/2018, she missed her last session due to significant abdominal pain.  ED Course:  Initial vitals show BP 155/81, pulse 82, RR 18, temp 90.5 Fahrenheit, SPO2 99% on room air.  Labs are notable for WBC 11.4, hemoglobin 11.9, platelets 283, BUN 69, creatinine 12.82, potassium 4.4, AST 14, ALT 16, alk phos 51, total bilirubin 0.6, lipase 179, lactic acid 0.8.  CT abdomen/pelvis with contrast showed changes consistent with acute pancreatitis without evidence of pancreatic necrosis or peripancreatic collection.  No gallstones, gallbladder wall thickening, or biliary dilatation were seen.  A new indeterminate 10 mm nodule within the dorsal aspect of left mid kidney was seen.  Patient was given IV Zofran and IV fentanyl x2 and the hospitalist service was consulted to admit for  further evaluation and management.  Review of Systems: As per HPI otherwise 10 point review of systems negative.    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 Ruptured s/p emergency repair    Past Surgical History:  Procedure Laterality Date   ABDOMINAL HYSTERECTOMY  1979   AV FISTULA PLACEMENT Left 06/16/2017   Procedure: ARTERIOVENOUS (AV) FISTULA CREATION LEFT ARM;  Surgeon: Conrad Lost Nation, MD;  Location: [facility];  Service: Vascular;  Laterality: Left;   [place] Left 10/02/2017   Procedure: BASILIC VEIN TRANSPOSITION SECOND STAGE LEFT ARM;  Surgeon: Rosetta Posner, MD;  Location: [facility];  Service: Vascular;  Laterality: Left;   ESOPHAGOGASTRODUODENOSCOPY N/A 05/29/2013   Procedure: ESOPHAGOGASTRODUODENOSCOPY (EGD);  Surgeon: Jerene Bears, MD;  Location[facility];  Service: Endoscopy;  Laterality: N/A;   EXCHANGE OF A DIALYSIS CATHETER Left 07/31/2017   Procedure: Removal  OF A  Right GroinTUNNELED  DIALYSIS CATHETER ,  Insertion of Left Femoral Dialysis Catheter.;  Surgeon: Rosetta Posner, MD;  Location: [facility];  Service: Vascular;  Laterality: Left;   INSERTION OF DIALYSIS CATHETER Right 06/16/2017   Procedure: INSERTION OF DIALYSIS CATHETER;  Surgeon: Conrad Garland, MD;  Location: [facility];  Service: Vascular;  Laterality: Right;   IR AV DIALY SHUNT INTRO NEEDLE/INTRACATH INITIAL W/PTA/IMG LEFT  06/21/2018   REPAIR OF  PERFORATED ULCER       reports that she has quit smoking. Her smoking use included cigarettes. She has a 10.00 pack-year smoking history. She has never used smokeless tobacco. She reports current drug use. Drugs: Heroin and Marijuana. She reports that she does not drink alcohol.  Allergies  Allergen Reactions   Aspirin Nausea And Vomiting    Stomach ache   Ibuprofen Nausea And Vomiting    Stomach ache    Family History  Problem Relation Age of Onset   Hypertension Father    Cancer Father    Hyperlipidemia Father    Seizures Sister    Early death Daughter    Kidney disease Daughter        end stage dialysis dependent      Prior to Admission medications   Medication Sig Start Date End Date Taking? Authorizing Provider  amLODipine (NORVASC) 10 MG tablet Take 1 tablet (10 mg total) by mouth daily. 11/11/16   Katheren Shams, DO  aspirin EC 81 MG tablet Take 1 tablet (81 mg total) by mouth daily. 04/02/18   Ladell Pier, MD  atorvastatin (LIPITOR) 40 MG tablet Take 1 tablet (40 mg total) by mouth daily at 6 PM. 04/04/18   Ladell Pier, MD  B Complex-C-Folic Acid (DIALYVITE 811) 0.8 MG TABS Take by mouth.    [provider]  cyclobenzaprine (FLEXERIL) 5 MG tablet Take 5 mg by mouth 3 (three) times daily as needed for muscle spasms.    [provider]  ferric citrate (AURYXIA) 1 GM 210 MG(Fe) tablet Take 210 mg by mouth 2 (two) times daily with a meal.    [provider]  hydrALAZINE (APRESOLINE) 25 MG tablet Take 1 tablet (25 mg total) by mouth every 8 (eight) hours. 07/02/17   Rory Percy, DO  Lidocaine-Prilocaine, Bulk, 2.5-2.5 % CREA by Does not apply route. Apply 1 a small amount to skin 3 times a week apply to AVF 60 min prior to dialysis, wrap site w plastic wrap q tx    [provider]  pantoprazole (PROTONIX) 40 MG tablet Take 1 tablet (40 mg total) by mouth daily. 07/02/17   Rory Percy, DO    Physical Exam: Vitals:    10/14/18 0151 10/14/18 0156  BP:  (!) 155/81  Pulse:  82  Resp:  18  Temp:  98.5 F (36.9 C)  TempSrc:  Oral  SpO2:  99%  Weight: 65.8 kg   Height: _0  (1.575 m)     Constitutional: Elderly woman resting supine in bed, NAD, calm, comfortable Eyes: PERRL, lids and conjunctivae normal ENMT: Mucous membranes are moist. Posterior pharynx clear of any exudate or lesions.Normal dentition.  Neck: normal, supple, no masses. Respiratory: Faint bibasilar crackles.  Normal respiratory effort. No accessory muscle use.  Cardiovascular: Regular rate and rhythm, no murmurs / rubs / gallops. No extremity edema. 2+ pedal pulses.  aVF LUE with palpable thrill. Abdomen: Generalized tenderness to palpation. No hepatosplenomegaly. Bowel sounds positive.  Musculoskeletal: no clubbing /  cyanosis. No joint deformity upper and lower extremities. Good ROM, no contractures. Normal muscle tone.  Skin: no rashes, lesions, ulcers. No induration Neurologic: CN 2-12 grossly intact. Sensation intact, Strength 5/5 in all 4.  Psychiatric: Normal judgment and insight. Alert and oriented x 3. Normal mood.     Labs on Admission: I have personally reviewed following labs and imaging studies  CBC: Recent Labs  Lab 10/14/18 0156  WBC 11.4*  NEUTROABS 7.0  HGB 11.9*  HCT 36.1  MCV 88.7  PLT 194   Basic Metabolic Panel: Recent Labs  Lab 10/14/18 0156  NA 141  K 4.4  CL 104  CO2 22  GLUCOSE 97  BUN 69*  CREATININE 12.82*  CALCIUM 9.9   GFR: Estimated Creatinine Clearance: 3.6 mL/min (A) (by C-G formula based on SCr of 12.82 mg/dL (H)). Liver Function Tests: Recent Labs  Lab 10/14/18 0156  AST 14*  ALT 16  ALKPHOS 51  BILITOT 0.6  PROT 7.3  ALBUMIN 3.5   Recent Labs  Lab 10/14/18 0156  LIPASE 179*   No results for input(s): AMMONIA in the last 168 hours. Coagulation Profile: No results for input(s): INR, PROTIME in the last 168 hours. Cardiac Enzymes: No results for input(s):  CKTOTAL, CKMB, CKMBINDEX, TROPONINI in the last 168 hours. BNP (last 3 results) No results for input(s): PROBNP in the last 8760 hours. HbA1C: No results for input(s): HGBA1C in the last 72 hours. CBG: No results for input(s): GLUCAP in the last 168 hours. Lipid Profile: No results for input(s): CHOL, HDL, LDLCALC, TRIG, CHOLHDL, LDLDIRECT in the last 72 hours. Thyroid Function Tests: No results for input(s): TSH, T4TOTAL, FREET4, T3FREE, THYROIDAB in the last 72 hours. Anemia Panel: No results for input(s): VITAMINB12, FOLATE, FERRITIN, TIBC, IRON, RETICCTPCT in the last 72 hours. Urine analysis:    Component Value Date/Time   COLORURINE YELLOW 06/19/2017 1820   APPEARANCEUR HAZY (A) 06/19/2017 1820   LABSPEC 1.013 06/19/2017 1820   PHURINE 5.0 06/19/2017 1820   GLUCOSEU 50 (A) 06/19/2017 1820   HGBUR SMALL (A) 06/19/2017 1820   BILIRUBINUR NEGATIVE 06/19/2017 1820   BILIRUBINUR NEG 01/29/2016 1142   KETONESUR NEGATIVE 06/19/2017 1820   PROTEINUR 100 (A) 06/19/2017 1820   UROBILINOGEN 0.2 01/29/2016 1142   UROBILINOGEN 0.2 03/21/2015 1649   NITRITE NEGATIVE 06/19/2017 1820   LEUKOCYTESUR TRACE (A) 06/19/2017 1820    Radiological Exams on Admission: Ct Abdomen Pelvis W Contrast  Result Date: 10/14/2018 CLINICAL DATA:  68 y/o  F; left-sided abdominal pain for 3 days. EXAM: CT ABDOMEN AND PELVIS WITH CONTRAST TECHNIQUE: Multidetector CT imaging of the abdomen and pelvis was performed using the standard protocol following bolus administration of intravenous contrast. CONTRAST:  147m OMNIPAQUE IOHEXOL 300 MG/ML  SOLN COMPARISON:  05/26/2013 CT abdomen and pelvis. 06/14/2017 lumbar spine MRI. 06/14/2018 renal ultrasound. FINDINGS: Lower chest: Small posterior left diaphragmatic hernia containing fat is stable. Stable 3 mm nodule a peripheral right lower lobe compatible with benign etiology. Hepatobiliary: No focal liver abnormality is seen. No gallstones, gallbladder wall thickening, or  biliary dilatation. Pancreas: Mild edema surrounding the body and tail of pancreas. No findings of pancreatic necrosis or acute peripancreatic collection. Spleen: Normal in size without focal abnormality. Adrenals/Urinary Tract: Adrenal glands are unremarkable. Bilateral renal atrophy. Small cysts are present in kidneys bilaterally. No hydronephrosis or urinary stone. Normal bladder. New indeterminate 10 mm 41 HU nodule within the dorsal aspect of left mid kidney (series 3, image 15). Stomach/Bowel: Stomach is within  normal limits. Appendix appears normal. No evidence of bowel wall thickening, distention, or inflammatory changes. Vascular/Lymphatic: Aortic atherosclerosis. No enlarged abdominal or pelvic lymph nodes. Reproductive: Status post hysterectomy. No adnexal masses. Other: No abdominal wall hernia or abnormality. No abdominopelvic ascites. Musculoskeletal: No fracture is seen. IMPRESSION: 1. Mild edema surrounding the body and tail of pancreas compatible with acute pancreatitis in the appropriate clinical setting. No evidence for pancreatic necrosis or acute peripancreatic collection. 2. New indeterminate 10 mm nodule within the dorsal aspect of left mid kidney. Further characterization with renal protocol CT or MRI is recommended. This recommendation follows ACR consensus guidelines: Management of Incidental Renal Mass on CT: A White Paper of the ACR Incidental Findings Committee. J Am Coll Radiol 2017; article in press. 3. Aortic Atherosclerosis (ICD10-I70.0). Electronically Signed   By: Kristine Garbe M.D.   On: 10/14/2018 03:42    EKG: Independently reviewed.  Sinus rhythm without acute ischemic changes, motion artifact.  Assessment/Plan Principal Problem:   Acute pancreatitis Active Problems:   Peptic ulcer disease   Hyperlipidemia   Hypertension   ESRD (end stage renal disease) on dialysis ([place])   History of CVA (cerebrovascular accident)  CAMAY PEDIGO is a 68 y.o. female  with medical history significant for ESRD on TTS HD, history of CVA, hypertension, hyperlipidemia, chronic hepatitis C, peptic ulcer disease, and history of substance use who is admitted with acute pancreatitis.   Acute pancreatitis: Unclear etiology, no gallstones or biliary dilatation seen on CT scan.  Patient denies any recent alcohol use.  Continue supportive care and pain control.  IV fluid resuscitation limited due to ESRD. -Give 500 mL normal saline now -Continue pain control with oral OxyIR as tolerated and IV Dilaudid as needed -Strict I/O's  ESRD on TTS HD: Last dialysis was 10/10/2018.  Currently appears euvolemic to mildly hypovolemic on exam.  No emergent need for dialysis. -Consult nephrology in a.m. for usual dialysis  Hypertension: -Continue home amlodipine and hydralazine  History of CVA: -Continue aspirin 81 mg daily and atorvastatin 40 mg daily  Peptic ulcer disease: -Continue pantoprazole  Left kidney nodule: Determinate 10 mm nodule of the left mid kidney seen on CT scan.  Follow-up renal protocol CT or MRI recommended.  DVT prophylaxis: Subcutaneous heparin Code Status: Full code, confirmed with patient Family Communication: None present on admission Disposition Plan: Pending clinical progress Consults called: None, will need nephrology consult for usual dialysis Admission status: Observation   Zada Finders MD [facility] Pager [phone]  If 7PM-7AM, please contact night-coverage [email or web address]  10/14/2018, 5:13 AM
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
## 2018-10-14 NOTE — Consult Note (Signed)
[facility] Renal Consultation Note  Indication for Consultation:  Management of ESRD/hemodialysis; anemia, hypertension/volume and secondary hyperparathyroidism  HPI: Ashley [name] is a 68 y.o. female with  ESRD ( HD TTS  @ [facility])started at 06/17/17 [facility] , . PMH subacute CVA, Ischemic L posterior limb of internal capsule. Hep C, pancreatitis ,polysubstance abuse (denies etoh past 27yr/ but  use + Marijuana  ) HTN, HLD  . Now admitted with Pancreatitis( lipase 179) Last HD was Sat 5/02, missed Tuesday sec abd pain ( ate [employer] Saturday night from takeout never  eaten there before) Usually  compliant with op HD .  Denies fevers , chills, sob, cough , CP, dizziness or problems with last HD . Currently in Rm back from [place] states Abd pain better with pain med.      Past Medical History:  Diagnosis Date  . Acute kidney injury ([place]) 05/24/2013  . Arthritis   . Cervical radiculopathy 02/28/2011  . Chronic renal failure   . Cocaine substance abuse ([place]) 05/26/2013   positive UDS   . Gastropathy   . GERD (gastroesophageal reflux disease)   . Hepatitis C 1987   history of IVDA  . Hiatal hernia   . Hyperlipidemia   . Hypertension   . Marijuana abuse 12/18/204   positive UDS, family members smoke as well  . Pancreatitis 2000   resolved  . 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 Schatzki's ring   . Stroke ([place])   . Ulcer 1990   gastric ulcer. Ruptured s/p emergency repair    Past Surgical History:  Procedure Laterality Date  . ABDOMINAL HYSTERECTOMY  1979  . AV FISTULA PLACEMENT Left 06/16/2017   Procedure: ARTERIOVENOUS (AV) FISTULA CREATION LEFT ARM;  Surgeon: Conrad Alpine, MD;  Location: [place];  Service: Vascular;  Laterality: Left;  . BASCILIC VEIN TRANSPOSITION Left 10/02/2017   Procedure: BASILIC VEIN TRANSPOSITION SECOND STAGE LEFT ARM;  Surgeon: Rosetta Posner, MD;  Location[facility] OR;  Service: Vascular;  Laterality: Left;  . ESOPHAGOGASTRODUODENOSCOPY N/A 05/29/2013    Procedure: ESOPHAGOGASTRODUODENOSCOPY (EGD);  Surgeon: Jerene Bears, MD;  Location: [facility] ENDOSCOPY;  Service: Endoscopy;  Laterality: N/A;  . EXCHANGE OF A DIALYSIS CATHETER Left 07/31/2017   Procedure: Removal  OF A  Right GroinTUNNELED  DIALYSIS CATHETER ,  Insertion of Left Femoral Dialysis Catheter.;  Surgeon: Rosetta Posner, MD;  Location: [place];  Service: Vascular;  Laterality: Left;  . INSERTION OF DIALYSIS CATHETER Right 06/16/2017   Procedure: INSERTION OF DIALYSIS CATHETER;  Surgeon: Conrad , MD;  Location: [place];  Service: Vascular;  Laterality: Right;  . IR AV DIALY SHUNT INTRO [place] W/PTA/IMG LEFT  06/21/2018  . REPAIR OF PERFORATED ULCER        Family History  Problem Relation Age of Onset  . Hypertension Father   . Cancer Father   . Hyperlipidemia Father   . Seizures Sister   . Early death Daughter   . Kidney disease Daughter        end stage dialysis dependent       reports that she has quit smoking. Her smoking use included cigarettes. She has a 10.00 pack-year smoking history. She has never used smokeless tobacco. She reports current drug use. Drugs: Heroin and Marijuana. She reports that she does not drink alcohol.   Allergies  Allergen Reactions  . Aspirin Nausea And Vomiting    Stomach ache  . Ibuprofen Nausea And Vomiting  Stomach ache    Prior to Admission medications   Medication Sig Start Date End Date Taking? Authorizing Provider  amLODipine (NORVASC) 10 MG tablet Take 1 tablet (10 mg total) by mouth daily. 11/11/16  Yes Luiz Blare Y, DO  atorvastatin (LIPITOR) 40 MG tablet Take 1 tablet (40 mg total) by mouth daily at 6 PM. 04/04/18  Yes Ladell Pier, MD  B Complex-C-Folic Acid (DIALYVITE 790) 0.8 MG TABS Take by mouth.   Yes [provider]  cyclobenzaprine (FLEXERIL) 5 MG tablet Take 5 mg by mouth 3 (three) times daily as needed for muscle spasms.   Yes [provider]  ferric citrate (AURYXIA) 1 GM 210  MG(Fe) tablet Take 210 mg by mouth 2 (two) times daily with a meal.   Yes [provider]  hydrALAZINE (APRESOLINE) 25 MG tablet Take 1 tablet (25 mg total) by mouth every 8 (eight) hours. 07/02/17  Yes Rumball, Alison, DO  Lidocaine-Prilocaine, Bulk, 2.5-2.5 % CREA by Does not apply route. Apply 1 a small amount to skin 3 times a week apply to AVF 60 min prior to dialysis, wrap site w plastic wrap q tx   Yes [provider]  pantoprazole (PROTONIX) 40 MG tablet Take 1 tablet (40 mg total) by mouth daily. 07/02/17  Yes Rory Percy, DO    WIO:[id number] **OR** acetaminophen, HYDROmorphone (DILAUDID) injection, ondansetron **OR** ondansetron (ZOFRAN) IV, oxyCODONE  Results for orders placed or performed during the hospital encounter of 10/14/18 (from the past 48 hour(s))  CBC with Differential/Platelet     Status: Abnormal   Collection Time: 10/14/18  1:56 AM  Result Value Ref Range   WBC 11.4 (H) 4.0 - 10.5 K/uL   RBC 4.07 3.87 - 5.11 MIL/uL   Hemoglobin 11.9 (L) 12.0 - 15.0 g/dL   HCT 36.1 36.0 - 46.0 %   MCV 88.7 80.0 - 100.0 fL   MCH 29.2 26.0 - 34.0 pg   MCHC 33.0 30.0 - 36.0 g/dL   RDW 18.1 (H) 11.5 - 15.5 %   Platelets 283 150 - 400 K/uL   nRBC 0.0 0.0 - 0.2 %   Neutrophils Relative % 63 %   Neutro Abs 7.0 1.7 - 7.7 K/uL   Lymphocytes Relative 25 %   Lymphs Abs 2.9 0.7 - 4.0 K/uL   Monocytes Relative 10 %   Monocytes Absolute 1.2 (H) 0.1 - 1.0 K/uL   Eosinophils Relative 2 %   Eosinophils Absolute 0.3 0.0 - 0.5 K/uL   Basophils Relative 0 %   Basophils Absolute 0.0 0.0 - 0.1 K/uL   Immature Granulocytes 0 %   Abs Immature Granulocytes 0.05 0.00 - 0.07 K/uL    Comment: Performed at [facility], [address] [place], [place] [address]  Comprehensive metabolic panel     Status: Abnormal   Collection Time: 10/14/18  1:56 AM  Result Value Ref Range   Sodium 141 135 - 145 mmol/L   Potassium 4.4 3.5 - 5.1 mmol/L   Chloride 104 98 - 111 mmol/L    CO2 22 22 - 32 mmol/L   Glucose, Bld 97 70 - 99 mg/dL   BUN 69 (H) 8 - 23 mg/dL   Creatinine, Ser 12.82 (H) 0.44 - 1.00 mg/dL   Calcium 9.9 8.9 - 10.3 mg/dL   Total Protein 7.3 6.5 - 8.1 g/dL   Albumin 3.5 3.5 - 5.0 g/dL   AST 14 (L) 15 - 41 U/L   ALT 16 0 -  44 U/L   Alkaline Phosphatase 51 38 - 126 U/L   Total Bilirubin 0.6 0.3 - 1.2 mg/dL   GFR calc non Af Amer 3 (L) >60 mL/min   GFR calc Af Amer 3 (L) >60 mL/min   Anion gap 15 5 - 15    Comment: Performed at [facility] [address] [place], [place] [address]  Lipase, blood     Status: Abnormal   Collection Time: 10/14/18  1:56 AM  Result Value Ref Range   Lipase 179 (H) 11 - 51 U/L    Comment: Performed at [facility], [place] [address], [place], [place] [address]  Lactic acid, plasma     Status: None   Collection Time: 10/14/18  1:56 AM  Result Value Ref Range   Lactic Acid, Venous 0.8 0.5 - 1.9 mmol/L    Comment: Performed at [facility] [address], [place], [place] [address]  Urine rapid drug screen (hosp performed)     Status: Abnormal   Collection Time: 10/14/18  9:38 AM  Result Value Ref Range   Opiates NONE DETECTED NONE DETECTED   Cocaine NONE DETECTED NONE DETECTED   Benzodiazepines NONE DETECTED NONE DETECTED   Amphetamines NONE DETECTED NONE DETECTED   Tetrahydrocannabinol POSITIVE (A) NONE DETECTED   Barbiturates NONE DETECTED NONE DETECTED    Comment: (NOTE) DRUG SCREEN FOR MEDICAL PURPOSES ONLY.  IF CONFIRMATION IS NEEDED FOR ANY PURPOSE, NOTIFY LAB WITHIN 5 DAYS. LOWEST DETECTABLE LIMITS FOR URINE DRUG SCREEN Drug Class                     Cutoff (ng/mL) Amphetamine and metabolites    1000 Barbiturate and metabolites    200 Benzodiazepine                 734 Tricyclics and metabolites     300 Opiates and metabolites        300 Cocaine and metabolites        300 THC                            50 Performed at [facility], [place] [address] [place], [place] [address]      ROS: See hpi   Physical Exam: Vitals:   10/14/18 0156 10/14/18 0828  BP: (!) 155/81 (!) 145/79  Pulse: 82   Resp: 18 18  Temp: 98.5 F (36.9 C) 98 F (36.7 C)  SpO2: 99% 100%     General: alert wd, wn  Female ,nad HEENT: , eomi, not icteric, MMM Neck: supple, no jvd Heart: RRR no r,m,g Lungs: CTA bilat ,nonlabored breathing  Abdomen: BS pos, Soft , Non tender, Non distended, no mass appreciated   Extremities: NO pedal edema  Skin: Warm dry with out rash or ulcer noted  Neuro: Alert, OX3, moving all extrem , appropriate  Dialysis Access: LUA AVF pos bruit   Dialysis Orders: Center: [facility] on TTS . EDW 65.5 HD Bath 2k, 2.5 CA  Time 3hr 33min Heparin 2000 bolus / 1037mid tx. Access LUA AVF     Hec 4 mcg IV/HD  NO ESA  Last hgb 12.5 (/30/20)   Assessment/Plan  1. ESRD -  HD tts (has missed hd 5/05  last hd 5/02)  k 4.4  Bun 69  Cr 12.89  ( op bun 38  Cr 10 )  Hd today for mildly  ^  lab ,no excess vol  2. Pancreatitis = wu per admit  3. Hypertension/volume  - no excess vol on exam ,(at edw by wt) bp stable Mild ^ with abd discomfort earlier / no uf today / Home amlodipine 5mg  / hydralazine 25 mg tid  4. Anemia  - hgb 11.9  No esa 5. Metabolic bone disease - CA  9.9 Hec continue / Auryxia as binder continue when po meal 6. Left kidney nodule:Determinate 10 mm nodule of the left mid kidney seen on CT scan.  Follow-up renal protocol CT or MRI recommended.  Ernest Haber, PA-C [facility] [phone] 10/14/2018, 10:22 AM

## 2018-10-14 NOTE — Progress Notes (Signed)
Patient admitted after midnight, please see H&P.  Here with pancreatitis.  Had episode in past but not sure what caused it.  Denies current alcohol -check FLP -check RUQ U/S  Eulogio Bear DO

## 2018-10-15 ENCOUNTER — Other Ambulatory Visit: Payer: Self-pay

## 2018-10-15 DIAGNOSIS — N186 End stage renal disease: Secondary | ICD-10-CM

## 2018-10-15 LAB — CBC
HCT: 34.2 % — ABNORMAL LOW (ref 36.0–46.0)
Hemoglobin: 11.3 g/dL — ABNORMAL LOW (ref 12.0–15.0)
MCH: 29.1 pg (ref 26.0–34.0)
MCHC: 33 g/dL (ref 30.0–36.0)
MCV: 88.1 fL (ref 80.0–100.0)
Platelets: 235 10*3/uL (ref 150–400)
RBC: 3.88 MIL/uL (ref 3.87–5.11)
RDW: 17.9 % — ABNORMAL HIGH (ref 11.5–15.5)
WBC: 6.9 10*3/uL (ref 4.0–10.5)
nRBC: 0 % (ref 0.0–0.2)

## 2018-10-15 MED ORDER — DICLOFENAC SODIUM 1 % TD GEL
2.0000 g | Freq: Four times a day (QID) | TRANSDERMAL | Status: DC
  Administered 2018-10-15 – 2018-10-16 (×5): 2 g via TOPICAL
  Filled 2018-10-15: qty 100
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
## 2018-10-15 NOTE — Progress Notes (Signed)
[facility] Progress Note    Assessment/ Plan:   Dialysis Orders: Center: [facility] on TTS . EDW 65.5 HD Bath 2k, 2.5 CA  Time 3hr 38min Heparin 2000 bolus / 1055mid tx. Access LUA AVF     Hec 4 mcg IV/HD  NO ESA  Last hgb 12.5 (/30/20)   Assessment/Plan  1. ESRD -  HD tts (has missed hd 5/05  last hd 5/02)  k 4.4  Bun 69  Cr 12.89  ( op bun 38  Cr 10 )  s/p Hd Thur. - next HD Sat; minimal UF given poor oral intake.   2. Pancreatitis = wu per admit; currently NPO  3. Hypertension/volume  - no excess vol on exam ,(at edw by wt) bp stable Mild ^ with abd discomfort; a little better than yest / Home amlodipine 5mg  / hydralazine 25 mg tid  4. Anemia  - hgb 11.3  No esa 5. Metabolic bone disease - CA  9.9 Hec continue / Auryxia as binder continue when po meal 6. Left kidney nodule:Determinate 10 mm nodule of the left mid kidney seen on CT scan. Follow-up renal protocol CT or MRI recommended.  Subjective:   Lt flank pain with movement, o/w non painful. Abd pain but better than yest. Denies f/c/n/v   Objective:   BP 123/77 (BP Location: Right Arm)   Pulse 91   Temp 100.2 F (37.9 C) (Oral)   Resp 18   Ht 5\' 2"  (1.575 m)   Wt 67.4 kg   SpO2 92%   BMI 27.18 kg/m   Intake/Output Summary (Last 24 hours) at 10/15/2018 7408 Last data filed at 10/15/2018 0453 Gross per 24 hour  Intake 60 ml  Output -68 ml  Net 128 ml   Weight change: 2.028 kg  Physical Exam: General: alert wd, wn  Female ,nad Heart: RRR no r,m,g Lungs: CTA bilat ,nonlabored breathing  Abdomen: BS pos, SNDNT Extremities: NO pedal edema  Skin: Warm dry with out rash or ulcer noted  Neuro: Alert, OX3, moving all extrem , appropriate  Dialysis Access: LUA BBF pos bruit   Imaging: Ct Abdomen Pelvis W Contrast  Result Date: 10/14/2018 CLINICAL DATA:  68 y/o  F; left-sided abdominal pain for 3 days. EXAM: CT ABDOMEN AND PELVIS WITH CONTRAST TECHNIQUE: Multidetector CT imaging of the abdomen and pelvis  was performed using the standard protocol following bolus administration of intravenous contrast. CONTRAST:  179mL OMNIPAQUE IOHEXOL 300 MG/ML  SOLN COMPARISON:  05/26/2013 CT abdomen and pelvis. 06/14/2017 lumbar spine MRI. 06/14/2018 renal ultrasound. FINDINGS: Lower chest: Small posterior left diaphragmatic hernia containing fat is stable. Stable 3 mm nodule a peripheral right lower lobe compatible with benign etiology. Hepatobiliary: No focal liver abnormality is seen. No gallstones, gallbladder wall thickening, or biliary dilatation. Pancreas: Mild edema surrounding the body and tail of pancreas. No findings of pancreatic necrosis or acute peripancreatic collection. Spleen: Normal in size without focal abnormality. Adrenals/Urinary Tract: Adrenal glands are unremarkable. Bilateral renal atrophy. Small cysts are present in kidneys bilaterally. No hydronephrosis or urinary stone. Normal bladder. New indeterminate 10 mm 41 HU nodule within the dorsal aspect of left mid kidney (series 3, image 15). Stomach/Bowel: Stomach is within normal limits. Appendix appears normal. No evidence of bowel wall thickening, distention, or inflammatory changes. Vascular/Lymphatic: Aortic atherosclerosis. No enlarged abdominal or pelvic lymph nodes. Reproductive: Status post hysterectomy. No adnexal masses. Other: No abdominal wall hernia or abnormality. No abdominopelvic ascites. Musculoskeletal: No fracture is seen. IMPRESSION: 1. Mild  edema surrounding the body and tail of pancreas compatible with acute pancreatitis in the appropriate clinical setting. No evidence for pancreatic necrosis or acute peripancreatic collection. 2. New indeterminate 10 mm nodule within the dorsal aspect of left mid kidney. Further characterization with renal protocol CT or MRI is recommended. This recommendation follows ACR consensus guidelines: Management of Incidental Renal Mass on CT: A White Paper of the ACR Incidental Findings Committee. J Am Coll  Radiol 2017; article in press. 3. Aortic Atherosclerosis (ICD10-I70.0). Electronically Signed   By: Kristine Garbe M.D.   On: 10/14/2018 03:42   US Abdomen Limited Ruq  Result Date: 10/14/2018 CLINICAL DATA:  Pancreatitis EXAM: ULTRASOUND ABDOMEN LIMITED RIGHT UPPER QUADRANT COMPARISON:  Abdominal CT 10/14/2018 FINDINGS: Gallbladder: No gallstones or wall thickening visualized. No sonographic Murphy sign noted by sonographer. Common bile duct: Diameter: 5 mm Liver: No focal lesion identified. Within normal limits in parenchymal echogenicity. Portal vein is patent on color Doppler imaging with normal direction of blood flow towards the liver. Echogenic right kidney as seen with chronic medical renal disease. IMPRESSION: Negative.  No evidence of biliary calculus. Electronically Signed   By: Monte Fantasia M.D.   On: 10/14/2018 10:15    Labs: BMET Recent Labs  Lab 10/14/18 0156 10/15/18 0318  NA 141 138  K 4.4 4.3  CL 104 101  CO2 22 26  GLUCOSE 97 79  BUN 69* 23  CREATININE 12.82* 7.09*  CALCIUM 9.9 8.6*   CBC Recent Labs  Lab 10/14/18 0156 10/15/18 0318  WBC 11.4* 6.9  NEUTROABS 7.0  --   HGB 11.9* 11.3*  HCT 36.1 34.2*  MCV 88.7 88.1  PLT 283 235    Medications:    . amLODipine  10 mg Oral Daily  . aspirin EC  81 mg Oral Daily  . atorvastatin  40 mg Oral q1800  . [START ON 10/16/2018] doxercalciferol  4 mcg Intravenous Q T,Th,Sa-HD  . heparin  5,000 Units Subcutaneous Q8H  . hydrALAZINE  25 mg Oral Q8H  . pantoprazole  40 mg Oral Daily      Otelia Santee, MD 10/15/2018, 7:14 AM

## 2018-10-15 NOTE — Progress Notes (Addendum)
Progress Note    Ashley [name]  IHK:[id number] DOB: [birth year]  DOA: 10/14/2018 PCP: Ladell Pier, MD    Brief Narrative:     Medical records reviewed and are as summarized below:  Ashley [name] is an 68 y.o. female with medical history significant for ESRD on TTS HD, history of CVA, hypertension, hyperlipidemia, chronic hepatitis C, peptic ulcer disease, and history of substance use who presents to the ED for evaluation of abdominal pain.  Patient states she had new onset abdominal pain across her upper abdomen beginning 3 to 4 days ago.  Symptoms have been persistent and she has been unable to maintain adequate oral intake due to fear of exacerbating her pain.    Assessment/Plan:   Principal Problem:   Acute pancreatitis Active Problems:   Peptic ulcer disease   Hyperlipidemia   Hypertension   ESRD (end stage renal disease) on dialysis ([place])   History of CVA (cerebrovascular accident)  Acute pancreatitis: Unclear etiology, no gallstones or biliary dilatation seen on CT scan.  RUQ normal, Patient denies any recent alcohol use.   -Continue pain control with oral OxyIR as tolerated and IV Dilaudid as needed (has not used dilaudid today -advance diet as tolerated with hope for d/c in AM  ESRD on TTS HD: -s/p HD yesterday  Hypertension: -Continue home amlodipine and hydralazine  History of CVA: -Continue aspirin 81 mg daily and atorvastatin 40 mg daily  Peptic ulcer disease: -Continue pantoprazole  Left kidney nodule: Determinate 10 mm nodule of the left mid kidney seen on CT scan.  Follow-up renal protocol CT or MRI recommended.   Family Communication/Anticipated D/C date and plan/Code Status   DVT prophylaxis: heparin Code Status: Full Code.  Family Communication:  Disposition Plan: home in AM if tolerates advancement of diet-- needs continued monitoring in hospital-- ESRD so judicious fluids    Medical Consultants:    renal  Subjective:    Less pain and would like to try clear liquids (has not required dilaudid today)  Objective:    Vitals:   10/14/18 2154 10/15/18 0453 10/15/18 0500 10/15/18 0945  BP: 127/72 123/77  130/62  Pulse: 75 91  89  Resp: 20 18  20   Temp: 98.1 F (36.7 C) 100.2 F (37.9 C)  99.6 F (37.6 C)  TempSrc: Oral Oral  Oral  SpO2: 95% 92%  95%  Weight: 67.4 kg  67.4 kg   Height:        Intake/Output Summary (Last 24 hours) at 10/15/2018 1248 Last data filed at 10/15/2018 1241 Gross per 24 hour  Intake 370 ml  Output 532 ml  Net -162 ml   Filed Weights   10/14/18 1622 10/14/18 2154 10/15/18 0500  Weight: 67.8 kg 67.4 kg 67.4 kg    Exam: In bed, NAD rrr Clear, no wheezing +BS, min edema No LE edema  Data Reviewed:   I have personally reviewed following labs and imaging studies:  Labs: Labs show the following:   Basic Metabolic Panel: Recent Labs  Lab 10/14/18 0156 10/15/18 0318  NA 141 138  K 4.4 4.3  CL 104 101  CO2 22 26  GLUCOSE 97 79  BUN 69* 23  CREATININE 12.82* 7.09*  CALCIUM 9.9 8.6*   GFR Estimated Creatinine Clearance: 6.6 mL/min (A) (by C-G formula based on SCr of 7.09 mg/dL (H)). Liver Function Tests: Recent Labs  Lab 10/14/18 0156 10/15/18 0318  AST 14* 21  ALT 16 18  ALKPHOS 51 48  BILITOT 0.6 0.8  PROT 7.3 6.4*  ALBUMIN 3.5 2.9*   Recent Labs  Lab 10/14/18 0156  LIPASE 179*   No results for input(s): AMMONIA in the last 168 hours. Coagulation profile No results for input(s): INR, PROTIME in the last 168 hours.  CBC: Recent Labs  Lab 10/14/18 0156 10/15/18 0318  WBC 11.4* 6.9  NEUTROABS 7.0  --   HGB 11.9* 11.3*  HCT 36.1 34.2*  MCV 88.7 88.1  PLT 283 235   Cardiac Enzymes: No results for input(s): CKTOTAL, CKMB, CKMBINDEX, TROPONINI in the last 168 hours. BNP (last 3 results) No results for input(s): PROBNP in the last 8760 hours. CBG: No results for input(s): GLUCAP in the last 168 hours. D-Dimer: No results for  input(s): DDIMER in the last 72 hours. Hgb A1c: No results for input(s): HGBA1C in the last 72 hours. Lipid Profile: Recent Labs    10/14/18 0941  CHOL 177  HDL 61  LDLCALC 97  TRIG 96  CHOLHDL 2.9   Thyroid function studies: No results for input(s): TSH, T4TOTAL, T3FREE, THYROIDAB in the last 72 hours.  Invalid input(s): FREET3 Anemia work up: No results for input(s): VITAMINB12, FOLATE, FERRITIN, TIBC, IRON, RETICCTPCT in the last 72 hours. Sepsis Labs: Recent Labs  Lab 10/14/18 0156 10/15/18 0318  WBC 11.4* 6.9  LATICACIDVEN 0.8  --     Microbiology Recent Results (from the past 240 hour(s))  MRSA PCR Screening     Status: None   Collection Time: 10/14/18 10:50 AM  Result Value Ref Range Status   MRSA by PCR NEGATIVE NEGATIVE Final    Comment:        The GeneXpert MRSA Assay (FDA approved for NASAL specimens only), is one component of a comprehensive MRSA colonization surveillance program. It is not intended to diagnose MRSA infection nor to guide or monitor treatment for MRSA infections. Performed at [facility], [place] [address] [place], [place] [address]     Procedures and diagnostic studies:  Ct Abdomen Pelvis W Contrast  Result Date: 10/14/2018 CLINICAL DATA:  68 y/o  F; left-sided abdominal pain for 3 days. EXAM: CT ABDOMEN AND PELVIS WITH CONTRAST TECHNIQUE: Multidetector CT imaging of the abdomen and pelvis was performed using the standard protocol following bolus administration of intravenous contrast. CONTRAST:  186mL OMNIPAQUE IOHEXOL 300 MG/ML  SOLN COMPARISON:  05/26/2013 CT abdomen and pelvis. 06/14/2017 lumbar spine MRI. 06/14/2018 renal ultrasound. FINDINGS: Lower chest: Small posterior left diaphragmatic hernia containing fat is stable. Stable 3 mm nodule a peripheral right lower lobe compatible with benign etiology. Hepatobiliary: No focal liver abnormality is seen. No gallstones, gallbladder wall thickening, or biliary dilatation.  Pancreas: Mild edema surrounding the body and tail of pancreas. No findings of pancreatic necrosis or acute peripancreatic collection. Spleen: Normal in size without focal abnormality. Adrenals/Urinary Tract: Adrenal glands are unremarkable. Bilateral renal atrophy. Small cysts are present in kidneys bilaterally. No hydronephrosis or urinary stone. Normal bladder. New indeterminate 10 mm 41 HU nodule within the dorsal aspect of left mid kidney (series 3, image 15). Stomach/Bowel: Stomach is within normal limits. Appendix appears normal. No evidence of bowel wall thickening, distention, or inflammatory changes. Vascular/Lymphatic: Aortic atherosclerosis. No enlarged abdominal or pelvic lymph nodes. Reproductive: Status post hysterectomy. No adnexal masses. Other: No abdominal wall hernia or abnormality. No abdominopelvic ascites. Musculoskeletal: No fracture is seen. IMPRESSION: 1. Mild edema surrounding the body and tail of pancreas compatible with acute pancreatitis in the appropriate clinical setting. No evidence  for pancreatic necrosis or acute peripancreatic collection. 2. New indeterminate 10 mm nodule within the dorsal aspect of left mid kidney. Further characterization with renal protocol CT or MRI is recommended. This recommendation follows ACR consensus guidelines: Management of Incidental Renal Mass on CT: A White Paper of the ACR Incidental Findings Committee. J Am Coll Radiol 2017; article in press. 3. Aortic Atherosclerosis (ICD10-I70.0). Electronically Signed   By: Kristine Garbe M.D.   On: 10/14/2018 03:42   US Abdomen Limited Ruq  Result Date: 10/14/2018 CLINICAL DATA:  Pancreatitis EXAM: ULTRASOUND ABDOMEN LIMITED RIGHT UPPER QUADRANT COMPARISON:  Abdominal CT 10/14/2018 FINDINGS: Gallbladder: No gallstones or wall thickening visualized. No sonographic Murphy sign noted by sonographer. Common bile duct: Diameter: 5 mm Liver: No focal lesion identified. Within normal limits in  parenchymal echogenicity. Portal vein is patent on color Doppler imaging with normal direction of blood flow towards the liver. Echogenic right kidney as seen with chronic medical renal disease. IMPRESSION: Negative.  No evidence of biliary calculus. Electronically Signed   By: Monte Fantasia M.D.   On: 10/14/2018 10:15    Medications:    amLODipine  10 mg Oral Daily   aspirin EC  81 mg Oral Daily   atorvastatin  40 mg Oral q1800   diclofenac sodium  2 g Topical QID   [START ON 10/16/2018] doxercalciferol  4 mcg Intravenous Q T,Th,Sa-HD   heparin  5,000 Units Subcutaneous Q8H   hydrALAZINE  25 mg Oral Q8H   pantoprazole  40 mg Oral Daily   Continuous Infusions:   LOS: 1 day   Geradine Girt  [facility]   How to contact the [facility] Attending or Consulting provider [place] or covering provider during after hours [place], for this patient?  1. Check the care team in [facility] and look for a) attending/consulting [facility] provider listed and b) the [facility] team listed 2. Log into [email or web address] and use [place]'s universal password to access. If you do not have the password, please contact the hospital operator. 3. Locate the [facility] provider you are looking for under [facility] and page to a number that you can be directly reached. 4. If you still have difficulty reaching the provider, please page the [facility] (Director on Call) for the Hospitalists listed on amion for assistance.  10/15/2018, 12:48 PM

## 2018-10-15 NOTE — Progress Notes (Signed)
Patient unable to tolerate full liquid diet. Patient placed back on clear liquid. Will continue to monitor.

## 2018-10-16 DIAGNOSIS — Z992 Dependence on renal dialysis: Secondary | ICD-10-CM

## 2018-10-16 DIAGNOSIS — K279 Peptic ulcer, site unspecified, unspecified as acute or chronic, without hemorrhage or perforation: Secondary | ICD-10-CM

## 2018-10-16 DIAGNOSIS — I10 Essential (primary) hypertension: Secondary | ICD-10-CM

## 2018-10-16 DIAGNOSIS — Z8673 Personal history of transient ischemic attack (TIA), and cerebral infarction without residual deficits: Secondary | ICD-10-CM

## 2018-10-16 DIAGNOSIS — E785 Hyperlipidemia, unspecified: Secondary | ICD-10-CM

## 2018-10-16 LAB — COMPREHENSIVE METABOLIC PANEL
ALT: 18 U/L (ref 0–44)
AST: 21 U/L (ref 15–41)
Albumin: 2.9 g/dL — ABNORMAL LOW (ref 3.5–5.0)
Alkaline Phosphatase: 48 U/L (ref 38–126)
Anion gap: 11 (ref 5–15)
BUN: 23 mg/dL (ref 8–23)
CO2: 26 mmol/L (ref 22–32)
Calcium: 8.6 mg/dL — ABNORMAL LOW (ref 8.9–10.3)
Chloride: 101 mmol/L (ref 98–111)
Creatinine, Ser: 7.09 mg/dL — ABNORMAL HIGH (ref 0.44–1.00)
GFR calc Af Amer: 6 mL/min — ABNORMAL LOW (ref >60)
GFR calc non Af Amer: 5 mL/min — ABNORMAL LOW (ref >60)
Glucose, Bld: 79 mg/dL (ref 70–99)
Potassium: 4.3 mmol/L (ref 3.5–5.1)
Sodium: 138 mmol/L (ref 135–145)
Total Bilirubin: 0.8 mg/dL (ref 0.3–1.2)
Total Protein: 6.4 g/dL — ABNORMAL LOW (ref 6.5–8.1)

## 2018-10-16 LAB — RENAL FUNCTION PANEL
Albumin: 3 g/dL — ABNORMAL LOW (ref 3.5–5.0)
Anion gap: 13 (ref 5–15)
BUN: 26 mg/dL — ABNORMAL HIGH (ref 8–23)
CO2: 24 mmol/L (ref 22–32)
Calcium: 9 mg/dL (ref 8.9–10.3)
Chloride: 101 mmol/L (ref 98–111)
Creatinine, Ser: 7.44 mg/dL — ABNORMAL HIGH (ref 0.44–1.00)
GFR calc Af Amer: 6 mL/min — ABNORMAL LOW (ref >60)
GFR calc non Af Amer: 5 mL/min — ABNORMAL LOW (ref >60)
Glucose, Bld: 84 mg/dL (ref 70–99)
Phosphorus: 4 mg/dL (ref 2.5–4.6)
Potassium: 4 mmol/L (ref 3.5–5.1)
Sodium: 138 mmol/L (ref 135–145)

## 2018-10-16 LAB — CBC
HCT: 35 % — ABNORMAL LOW (ref 36.0–46.0)
Hemoglobin: 11.3 g/dL — ABNORMAL LOW (ref 12.0–15.0)
MCH: 28.5 pg (ref 26.0–34.0)
MCHC: 32.3 g/dL (ref 30.0–36.0)
MCV: 88.4 fL (ref 80.0–100.0)
Platelets: 250 10*3/uL (ref 150–400)
RBC: 3.96 MIL/uL (ref 3.87–5.11)
RDW: 17.2 % — ABNORMAL HIGH (ref 11.5–15.5)
WBC: 4.6 10*3/uL (ref 4.0–10.5)
nRBC: 0 % (ref 0.0–0.2)

## 2018-10-16 LAB — LIPASE, BLOOD: Lipase: 55 U/L — ABNORMAL HIGH (ref 11–51)

## 2018-10-16 MED ORDER — DOXERCALCIFEROL 4 MCG/2ML IV SOLN
INTRAVENOUS | Status: AC
  Administered 2018-10-16: 4 ug via INTRAVENOUS
  Filled 2018-10-16: qty 2

## 2018-10-16 NOTE — Discharge Summary (Signed)
Physician Discharge Summary  Ashley [name] MHD:[id number] DOB: 1951-03-02 DOA: 10/14/2018  PCP: Ladell Pier, MD  Admit date: 10/14/2018 Discharge date: 10/16/2018  Admitted From: Home  Disposition: Home  Recommendations for Outpatient Follow-up:  1. Follow up with PCP/nephrology in 1-2 weeks 2. Continue to follow with dialysis schedule Tuesday Thursday Saturday  Discharge Condition: Stable CODE STATUS: Full Diet recommendation: Renal/dialysis diet per nephrology  Brief/Interim Summary: Ashley [name] is an 68 y.o. female with medical history significant forESRD on TTS HD, history of CVA, hypertension, hyperlipidemia, chronic hepatitis C, peptic ulcer disease, and history of substance use whopresents to the ED for evaluation of abdominal pain. Patient states she had new onset abdominal pain across her upper abdomen beginning 3 to 4 days ago. Symptoms have been persistent and she has been unable to maintain adequate oral intake due to fear of exacerbating her pain.  Above for acute intractable abdominal pain, poor p.o. intake nausea vomiting.  Lipase concerning for pancreatitis although no clear etiology was ever confirmed given negative imaging labs and history.  This time patient symptoms have resolved on supportive care, tolerating p.o. well, otherwise based downtrending.  Patient requesting discharge home which is certainly reasonable.  She will need close follow-up with PCP and nephrology in the next 3 to 5 days, patient is to continue dialysis Tuesday Thursday Saturday as scheduled.  Patient otherwise stable and agreeable for discharge home.  Discharge Diagnoses:  Principal Problem:   Acute pancreatitis Active Problems:   Peptic ulcer disease   Hyperlipidemia   Hypertension   ESRD (end stage renal disease) on dialysis ([facility])   History of CVA (cerebrovascular accident)  Acute pancreatitis, resolved, POA -without clear etiology or cause: Unclear etiology, no gallstones or  biliary dilatation seen on CT scan.RUQ normal, Patient denies any recent alcohol use or changes in medications/diet.  -Pain well controlled today, no further need for narcotics -Tolerating diet advancement well  ESRD on TTS HD: -Currently in dialysis, follow with nephrology as above and continue scheduled dialysis appointments  Hypertension, well controlled: -Continue home amlodipine and hydralazine  History of CVA: -Continue aspirin 81 mg daily and atorvastatin 40 mg daily  Peptic ulcer disease: -Continue pantoprazole  Left kidney nodule, incidentally noted: Indeterminate 10 mm nodule of the left mid kidney seen on CT scan. Follow-up renal protocol CT or MRI per protocol -defer to PCP for further evaluation and scheduling  Discharge Instructions: Follow-up with PCP and nephrology as scheduled   Allergies as of 10/16/2018      Reactions   Aspirin Nausea And Vomiting   Stomach ache   Ibuprofen Nausea And Vomiting   Stomach ache      Medication List    TAKE these medications   amLODipine 10 MG tablet Commonly known as:  NORVASC Take 1 tablet (10 mg total) by mouth daily.   atorvastatin 40 MG tablet Commonly known as:  LIPITOR Take 1 tablet (40 mg total) by mouth daily at 6 PM.   Auryxia 1 GM 210 MG(Fe) tablet Generic drug:  ferric citrate Take 210 mg by mouth 2 (two) times daily with a meal.   cyclobenzaprine 5 MG tablet Commonly known as:  FLEXERIL Take 5 mg by mouth 3 (three) times daily as needed for muscle spasms.   Dialyvite 800 0.8 MG Tabs Take by mouth.   hydrALAZINE 25 MG tablet Commonly known as:  APRESOLINE Take 1 tablet (25 mg total) by mouth every 8 (eight) hours.   Lidocaine-Prilocaine (Bulk) 2.5-2.5 %  Crea by Does not apply route. Apply 1 a small amount to skin 3 times a week apply to AVF 60 min prior to dialysis, wrap site w plastic wrap q tx   pantoprazole 40 MG tablet Commonly known as:  PROTONIX Take 1 tablet (40 mg total) by mouth  daily.       Allergies  Allergen Reactions  . Aspirin Nausea And Vomiting    Stomach ache  . Ibuprofen Nausea And Vomiting    Stomach ache    Consultations:  Nephrology   Procedures/Studies: Ct Abdomen Pelvis W Contrast  Result Date: 10/14/2018 CLINICAL DATA:  68 y/o  F; left-sided abdominal pain for 3 days. EXAM: CT ABDOMEN AND PELVIS WITH CONTRAST TECHNIQUE: Multidetector CT imaging of the abdomen and pelvis was performed using the standard protocol following bolus administration of intravenous contrast. CONTRAST:  112mL OMNIPAQUE IOHEXOL 300 MG/ML  SOLN COMPARISON:  05/26/2013 CT abdomen and pelvis. 06/14/2017 lumbar spine MRI. 06/14/2018 renal ultrasound. FINDINGS: Lower chest: Small posterior left diaphragmatic hernia containing fat is stable. Stable 3 mm nodule a peripheral right lower lobe compatible with benign etiology. Hepatobiliary: No focal liver abnormality is seen. No gallstones, gallbladder wall thickening, or biliary dilatation. Pancreas: Mild edema surrounding the body and tail of pancreas. No findings of pancreatic necrosis or acute peripancreatic collection. Spleen: Normal in size without focal abnormality. Adrenals/Urinary Tract: Adrenal glands are unremarkable. Bilateral renal atrophy. Small cysts are present in kidneys bilaterally. No hydronephrosis or urinary stone. Normal bladder. New indeterminate 10 mm 41 HU nodule within the dorsal aspect of left mid kidney (series 3, image 15). Stomach/Bowel: Stomach is within normal limits. Appendix appears normal. No evidence of bowel wall thickening, distention, or inflammatory changes. Vascular/Lymphatic: Aortic atherosclerosis. No enlarged abdominal or pelvic lymph nodes. Reproductive: Status post hysterectomy. No adnexal masses. Other: No abdominal wall hernia or abnormality. No abdominopelvic ascites. Musculoskeletal: No fracture is seen. IMPRESSION: 1. Mild edema surrounding the body and tail of pancreas compatible with acute  pancreatitis in the appropriate clinical setting. No evidence for pancreatic necrosis or acute peripancreatic collection. 2. New indeterminate 10 mm nodule within the dorsal aspect of left mid kidney. Further characterization with renal protocol CT or MRI is recommended. This recommendation follows ACR consensus guidelines: Management of Incidental Renal Mass on CT: A White Paper of the ACR Incidental Findings Committee. J Am Coll Radiol 2017; article in press. 3. Aortic Atherosclerosis (ICD10-I70.0). Electronically Signed   By: Kristine Garbe M.D.   On: 10/14/2018 03:42   US Abdomen Limited Ruq  Result Date: 10/14/2018 CLINICAL DATA:  Pancreatitis EXAM: ULTRASOUND ABDOMEN LIMITED RIGHT UPPER QUADRANT COMPARISON:  Abdominal CT 10/14/2018 FINDINGS: Gallbladder: No gallstones or wall thickening visualized. No sonographic Murphy sign noted by sonographer. Common bile duct: Diameter: 5 mm Liver: No focal lesion identified. Within normal limits in parenchymal echogenicity. Portal vein is patent on color Doppler imaging with normal direction of blood flow towards the liver. Echogenic right kidney as seen with chronic medical renal disease. IMPRESSION: Negative.  No evidence of biliary calculus. Electronically Signed   By: Monte Fantasia M.D.   On: 10/14/2018 10:15   (Echo, Carotid, EGD, Colonoscopy, ERCP)    Subjective: Tolerating p.o. well today without complaint or difficulty -abdominal pain has resolved.  Denies chest pain, shortness of breath, nausea, vomiting, diarrhea, constipation, headache, fevers, chills.   Discharge Exam: Vitals:   10/16/18 1130 10/16/18 1149  BP:  (!) 121/97  Pulse:  (!) 103  Resp:    Temp: 98.7  F (37.1 C)   SpO2:     Vitals:   10/16/18 1030 10/16/18 1100 10/16/18 1130 10/16/18 1149  BP: (!) 163/90 (!) 142/85  (!) 121/97  Pulse: 89 67  (!) 103  Resp:      Temp:   98.7 F (37.1 C)   TempSrc:   Oral   SpO2:      Weight:    65.9 kg  Height:         General: Pt is alert, awake, not in acute distress Cardiovascular: RRR, S1/S2 +, no rubs, no gallops Respiratory: CTA bilaterally, no wheezing, no rhonchi Abdominal: Soft, NT, ND, bowel sounds + Extremities: no edema, no cyanosis    The results of significant diagnostics from this hospitalization (including imaging, microbiology, ancillary and laboratory) are listed below for reference.     Microbiology: Recent Results (from the past 240 hour(s))  MRSA PCR Screening     Status: None   Collection Time: 10/14/18 10:50 AM  Result Value Ref Range Status   MRSA by PCR NEGATIVE NEGATIVE Final    Comment:        The GeneXpert MRSA Assay (FDA approved for NASAL specimens only), is one component of a comprehensive MRSA colonization surveillance program. It is not intended to diagnose MRSA infection nor to guide or monitor treatment for MRSA infections. Performed at [facility], [place] [address], [place], [place] [address]      Labs: BNP (last 3 results) No results for input(s): BNP in the last 8760 hours. Basic Metabolic Panel: Recent Labs  Lab 10/14/18 0156 10/15/18 0318 10/16/18 0657  NA 141 138 138  K 4.4 4.3 4.0  CL 104 101 101  CO2 22 26 24   GLUCOSE 97 79 84  BUN 69* 23 26*  CREATININE 12.82* 7.09* 7.44*  CALCIUM 9.9 8.6* 9.0  PHOS  --   --  4.0   Liver Function Tests: Recent Labs  Lab 10/14/18 0156 10/15/18 0318 10/16/18 0657  AST 14* 21  --   ALT 16 18  --   ALKPHOS 51 48  --   BILITOT 0.6 0.8  --   PROT 7.3 6.4*  --   ALBUMIN 3.5 2.9* 3.0*   Recent Labs  Lab 10/14/18 0156 10/16/18 0657  LIPASE 179* 55*   No results for input(s): AMMONIA in the last 168 hours. CBC: Recent Labs  Lab 10/14/18 0156 10/15/18 0318 10/16/18 0657  WBC 11.4* 6.9 4.6  NEUTROABS 7.0  --   --   HGB 11.9* 11.3* 11.3*  HCT 36.1 34.2* 35.0*  MCV 88.7 88.1 88.4  PLT 283 235 250   Cardiac Enzymes: No results for input(s): CKTOTAL, CKMB, CKMBINDEX, TROPONINI in  the last 168 hours. BNP: Invalid input(s): POCBNP CBG: No results for input(s): GLUCAP in the last 168 hours. D-Dimer No results for input(s): DDIMER in the last 72 hours. Hgb A1c No results for input(s): HGBA1C in the last 72 hours. Lipid Profile Recent Labs    10/14/18 0941  CHOL 177  HDL 61  LDLCALC 97  TRIG 96  CHOLHDL 2.9   Thyroid function studies No results for input(s): TSH, T4TOTAL, T3FREE, THYROIDAB in the last 72 hours.  Invalid input(s): FREET3 Anemia work up No results for input(s): VITAMINB12, FOLATE, FERRITIN, TIBC, IRON, RETICCTPCT in the last 72 hours. Urinalysis    Component Value Date/Time   COLORURINE YELLOW 06/19/2017 1820   APPEARANCEUR HAZY (A) 06/19/2017 1820   LABSPEC 1.013 06/19/2017 1820   PHURINE  5.0 06/19/2017 1820   GLUCOSEU 50 (A) 06/19/2017 1820   HGBUR SMALL (A) 06/19/2017 1820   BILIRUBINUR NEGATIVE 06/19/2017 1820   BILIRUBINUR NEG 01/29/2016 1142   KETONESUR NEGATIVE 06/19/2017 1820   PROTEINUR 100 (A) 06/19/2017 1820   UROBILINOGEN 0.2 01/29/2016 1142   UROBILINOGEN 0.2 03/21/2015 1649   NITRITE NEGATIVE 06/19/2017 1820   LEUKOCYTESUR TRACE (A) 06/19/2017 1820   Sepsis Labs Invalid input(s): PROCALCITONIN,  WBC,  LACTICIDVEN Microbiology Recent Results (from the past 240 hour(s))  MRSA PCR Screening     Status: None   Collection Time: 10/14/18 10:50 AM  Result Value Ref Range Status   MRSA by PCR NEGATIVE NEGATIVE Final    Comment:        The GeneXpert MRSA Assay (FDA approved for NASAL specimens only), is one component of a comprehensive MRSA colonization surveillance program. It is not intended to diagnose MRSA infection nor to guide or monitor treatment for MRSA infections. Performed at [facility], [place] [address], [place], [place] [address]      Time coordinating discharge: Over 30 minutes  SIGNED:   Little Ishikawa, DO [facility] 10/16/2018, 12:29 PM Pager   If 7PM-7AM, please  contact night-coverage [email or web address] Password [email or web address]

## 2018-10-16 NOTE — Progress Notes (Signed)
[facility] Progress Note    Assessment/ Plan:   Dialysis Orders: Center:[facility]. DJS97.68YO Bath 2k, 2.5 CATime 3hr 44minHeparin 2000 bolus / 1080mid tx. AccessLUA AVF  Hec 15mcg IV/HD  NO ESA Last hgb 12.5 (/30/20)  Assessment/Plan  1. ESRD -HD tts (has missed hd 5/05 last hd 5/02) k 4.4 Bun 69 Cr 12.89 ( op bun 38 Cr 10 ) s/p Hd Thur.  Seen on HD at 810AM Lt BBBT  3/2.25 142/81   UF goal 1.5L (net 1L) she's tolerating No changes for now  - next HD Tues   2. Pancreatitis = wu per admit; clears 5/8 and tolerated jello 3. Hypertension/volume - no excess vol on exam ,(at edw by wt) bp stable Mild ^ with abd discomfort; a little better than yest / Home amlodipine 5mg  / hydralazine 25 mg tid 4. Anemia - hgb 11.3 No esa 5. Metabolic bone disease -CA 9.9 Hec continue / Auryxia as binder continue when po meal 6. Left kidney nodule: 10 mm nodule of the left mid kidney seen on CT scan. Follow-up renal protocol CT or MRI recommended  Subjective:    Abd pain better and able to tolerate jello but upset by the ice cream.  Denies f/c/n/v   Objective:   BP (!) 176/86 (BP Location: Right Arm)   Pulse 82   Temp 98.6 F (37 C) (Oral)   Resp 20   Ht 5\' 2"  (1.575 m)   Wt 66.9 kg   SpO2 93%   BMI 26.98 kg/m   Intake/Output Summary (Last 24 hours) at 10/16/2018 0801 Last data filed at 10/16/2018 0200 Gross per 24 hour  Intake 700 ml  Output 600 ml  Net 100 ml   Weight change: -0.9 kg  Physical Exam: General:alert wd, wn Female ,nad Heart:RRR no r,m,g Lungs:CTA bilat ,nonlabored breathing Abdomen:BS pos, SNDNT Extremities:NO pedal edema Skin:Warm dry with out rash or ulcer noted Neuro:Alert, OX3, moving all extrem , appropriate Dialysis Access:LUA BBF pos bruit    Imaging: US Abdomen Limited Ruq  Result Date: 10/14/2018 CLINICAL DATA:  Pancreatitis EXAM: ULTRASOUND ABDOMEN LIMITED RIGHT UPPER QUADRANT COMPARISON:   Abdominal CT 10/14/2018 FINDINGS: Gallbladder: No gallstones or wall thickening visualized. No sonographic Murphy sign noted by sonographer. Common bile duct: Diameter: 5 mm Liver: No focal lesion identified. Within normal limits in parenchymal echogenicity. Portal vein is patent on color Doppler imaging with normal direction of blood flow towards the liver. Echogenic right kidney as seen with chronic medical renal disease. IMPRESSION: Negative.  No evidence of biliary calculus. Electronically Signed   By: Monte Fantasia M.D.   On: 10/14/2018 10:15    Labs: BMET Recent Labs  Lab 10/14/18 0156 10/15/18 0318  NA 141 138  K 4.4 4.3  CL 104 101  CO2 22 26  GLUCOSE 97 79  BUN 69* 23  CREATININE 12.82* 7.09*  CALCIUM 9.9 8.6*   CBC Recent Labs  Lab 10/14/18 0156 10/15/18 0318  WBC 11.4* 6.9  NEUTROABS 7.0  --   HGB 11.9* 11.3*  HCT 36.1 34.2*  MCV 88.7 88.1  PLT 283 235    Medications:    . amLODipine  10 mg Oral Daily  . aspirin EC  81 mg Oral Daily  . atorvastatin  40 mg Oral q1800  . diclofenac sodium  2 g Topical QID  . doxercalciferol  4 mcg Intravenous Q T,Th,Sa-HD  . heparin  5,000 Units Subcutaneous Q8H  . hydrALAZINE  25 mg Oral Q8H  .  pantoprazole  40 mg Oral Daily      Otelia Santee, MD 10/16/2018, 8:01 AM

## 2018-10-16 NOTE — Progress Notes (Signed)
DISCHARGE NOTE HOME Ashley [name] to be discharged to home per MD order. Discussed prescriptions and follow up appointments with the patient. Prescriptions given to patient; medication list explained in detail. Soft and low fat renal diet emphasized .Patient verbalized understanding.  Skin clean, dry and intact without evidence of skin break down, no evidence of skin tears noted. IV catheter discontinued intact. Site without signs and symptoms of complications. Dressing and pressure applied. Pt denies pain at the site currently. No complaints noted especially after eating her lunch and dinner meals,thought her food intake were just less than 50% of her meal tray,but the patient said "this is so much better ,first ,I was able to eat and keep it down and second I don't have any discomfort nor pain when I ate".  Patient free of lines, drains, and wounds.   An After Visit Summary (AVS) was printed and given to the patient. Patient escorted via wheelchair, and discharged home via private auto.  Green, Zenon Mayo, RN

## 2018-11-17 ENCOUNTER — Ambulatory Visit: Payer: Medicare Other

## 2018-11-17 ENCOUNTER — Inpatient Hospital Stay: Admission: RE | Admit: 2018-11-17 | Payer: Medicare Other | Source: Ambulatory Visit

## 2018-12-08 DIAGNOSIS — K859 Acute pancreatitis without necrosis or infection, unspecified: Secondary | ICD-10-CM

## 2018-12-08 HISTORY — DX: Acute pancreatitis without necrosis or infection, unspecified: K85.90

## 2018-12-15 ENCOUNTER — Emergency Department (HOSPITAL_COMMUNITY): Payer: Medicare Other

## 2018-12-15 ENCOUNTER — Emergency Department (HOSPITAL_COMMUNITY)
Admission: EM | Admit: 2018-12-15 | Discharge: 2018-12-15 | Disposition: A | Discharge status: Home / Self Care | Payer: Medicare Other | Attending: Emergency Medicine | Admitting: Emergency Medicine

## 2018-12-15 ENCOUNTER — Encounter (HOSPITAL_COMMUNITY): Payer: Self-pay | Admitting: Emergency Medicine

## 2018-12-15 DIAGNOSIS — I120 Hypertensive chronic kidney disease with stage 5 chronic kidney disease or end stage renal disease: Secondary | ICD-10-CM | POA: Diagnosis not present

## 2018-12-15 DIAGNOSIS — Z992 Dependence on renal dialysis: Secondary | ICD-10-CM | POA: Diagnosis not present

## 2018-12-15 DIAGNOSIS — Z87891 Personal history of nicotine dependence: Secondary | ICD-10-CM | POA: Insufficient documentation

## 2018-12-15 DIAGNOSIS — B182 Chronic viral hepatitis C: Secondary | ICD-10-CM | POA: Insufficient documentation

## 2018-12-15 DIAGNOSIS — N186 End stage renal disease: Secondary | ICD-10-CM | POA: Insufficient documentation

## 2018-12-15 DIAGNOSIS — R0789 Other chest pain: Secondary | ICD-10-CM | POA: Insufficient documentation

## 2018-12-15 DIAGNOSIS — Z79899 Other long term (current) drug therapy: Secondary | ICD-10-CM | POA: Insufficient documentation

## 2018-12-15 LAB — BASIC METABOLIC PANEL
Anion gap: 14 (ref 5–15)
BUN: 42 mg/dL — ABNORMAL HIGH (ref 8–23)
CO2: 27 mmol/L (ref 22–32)
Calcium: 9.7 mg/dL (ref 8.9–10.3)
Chloride: 98 mmol/L (ref 98–111)
Creatinine, Ser: 7.62 mg/dL — ABNORMAL HIGH (ref 0.44–1.00)
GFR calc Af Amer: 6 mL/min — ABNORMAL LOW (ref >60)
GFR calc non Af Amer: 5 mL/min — ABNORMAL LOW (ref >60)
Glucose, Bld: 92 mg/dL (ref 70–99)
Potassium: 4.7 mmol/L (ref 3.5–5.1)
Sodium: 139 mmol/L (ref 135–145)

## 2018-12-15 LAB — CBC
HCT: 35.6 % — ABNORMAL LOW (ref 36.0–46.0)
Hemoglobin: 11.6 g/dL — ABNORMAL LOW (ref 12.0–15.0)
MCH: 30.5 pg (ref 26.0–34.0)
MCHC: 32.6 g/dL (ref 30.0–36.0)
MCV: 93.7 fL (ref 80.0–100.0)
Platelets: 315 10*3/uL (ref 150–400)
RBC: 3.8 MIL/uL — ABNORMAL LOW (ref 3.87–5.11)
RDW: 14.7 % (ref 11.5–15.5)
WBC: 9.9 10*3/uL (ref 4.0–10.5)
nRBC: 0 % (ref 0.0–0.2)

## 2018-12-15 LAB — TROPONIN I (HIGH SENSITIVITY)
Troponin I (High Sensitivity): 4 ng/L (ref <18)
Troponin I (High Sensitivity): 4 ng/L (ref <18)

## 2018-12-15 LAB — D-DIMER, QUANTITATIVE: D-Dimer, Quant: 0.8 ug/mL-FEU — ABNORMAL HIGH (ref 0.00–0.50)

## 2018-12-15 MED ORDER — FENTANYL CITRATE (PF) 100 MCG/2ML IJ SOLN: 25.0000 ug | Freq: Once | INTRAMUSCULAR | Status: DC

## 2018-12-15 MED ORDER — FENTANYL CITRATE (PF) 100 MCG/2ML IJ SOLN
50.0000 ug | Freq: Once | INTRAMUSCULAR | Status: AC
  Administered 2018-12-15: 50 ug via INTRAVENOUS
  Filled 2018-12-15: qty 2

## 2018-12-15 MED ORDER — SODIUM CHLORIDE 0.9% FLUSH: 3.0000 mL | Freq: Once | INTRAVENOUS | Status: DC

## 2018-12-15 MED ORDER — FENTANYL CITRATE (PF) 100 MCG/2ML IJ SOLN
50.0000 ug | Freq: Once | INTRAMUSCULAR | Status: AC
  Administered 2018-12-15: 50 ug via INTRAMUSCULAR
  Filled 2018-12-15: qty 2

## 2018-12-15 MED ORDER — IOHEXOL 350 MG/ML SOLN
75.0000 mL | Freq: Once | INTRAVENOUS | Status: AC | PRN
  Administered 2018-12-15: 75 mL via INTRAVENOUS

## 2018-12-15 NOTE — ED Triage Notes (Signed)
Pt arrives to ED with cp that started 1 hour ago. Dialysis pt last treatment yesterday.

## 2018-12-15 NOTE — Discharge Instructions (Addendum)
Please read instructions below. Follow up with your primary care provider. You can take over the counter medication as needed for symptoms.  Return to the ER for new or worsening symptoms; including worsening chest pain, shortness of breath, pain that radiates to the arm or neck, pain or shortness of breath worsened with exertion.
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
## 2018-12-15 NOTE — ED Notes (Signed)
Patient transported to CT 

## 2018-12-27 ENCOUNTER — Telehealth: Payer: Self-pay

## 2018-12-27 NOTE — Telephone Encounter (Signed)
Referral from [facility] [phone], no notes and sent referral to scheduling

## 2018-12-29 ENCOUNTER — Inpatient Hospital Stay (HOSPITAL_COMMUNITY)
Admission: EM | Admit: 2018-12-29 | Discharge: 2019-01-04 | DRG: 438 | Disposition: A | Discharge status: Home / Self Care | Payer: Medicare Other | Attending: Internal Medicine | Admitting: Internal Medicine

## 2018-12-29 ENCOUNTER — Encounter (HOSPITAL_COMMUNITY): Payer: Self-pay

## 2018-12-29 ENCOUNTER — Other Ambulatory Visit: Payer: Self-pay

## 2018-12-29 DIAGNOSIS — Z992 Dependence on renal dialysis: Secondary | ICD-10-CM

## 2018-12-29 DIAGNOSIS — I120 Hypertensive chronic kidney disease with stage 5 chronic kidney disease or end stage renal disease: Secondary | ICD-10-CM | POA: Diagnosis present

## 2018-12-29 DIAGNOSIS — N186 End stage renal disease: Secondary | ICD-10-CM

## 2018-12-29 DIAGNOSIS — N2581 Secondary hyperparathyroidism of renal origin: Secondary | ICD-10-CM | POA: Diagnosis present

## 2018-12-29 DIAGNOSIS — Z20828 Contact with and (suspected) exposure to other viral communicable diseases: Secondary | ICD-10-CM | POA: Diagnosis present

## 2018-12-29 DIAGNOSIS — Z87891 Personal history of nicotine dependence: Secondary | ICD-10-CM

## 2018-12-29 DIAGNOSIS — Z90710 Acquired absence of both cervix and uterus: Secondary | ICD-10-CM

## 2018-12-29 DIAGNOSIS — K861 Other chronic pancreatitis: Secondary | ICD-10-CM | POA: Diagnosis present

## 2018-12-29 DIAGNOSIS — K219 Gastro-esophageal reflux disease without esophagitis: Secondary | ICD-10-CM | POA: Diagnosis present

## 2018-12-29 DIAGNOSIS — I10 Essential (primary) hypertension: Secondary | ICD-10-CM | POA: Diagnosis present

## 2018-12-29 DIAGNOSIS — B182 Chronic viral hepatitis C: Secondary | ICD-10-CM | POA: Diagnosis present

## 2018-12-29 DIAGNOSIS — K8500 Idiopathic acute pancreatitis without necrosis or infection: Secondary | ICD-10-CM | POA: Diagnosis not present

## 2018-12-29 DIAGNOSIS — Z79899 Other long term (current) drug therapy: Secondary | ICD-10-CM

## 2018-12-29 DIAGNOSIS — N289 Disorder of kidney and ureter, unspecified: Secondary | ICD-10-CM

## 2018-12-29 DIAGNOSIS — K279 Peptic ulcer, site unspecified, unspecified as acute or chronic, without hemorrhage or perforation: Secondary | ICD-10-CM | POA: Diagnosis present

## 2018-12-29 DIAGNOSIS — E785 Hyperlipidemia, unspecified: Secondary | ICD-10-CM | POA: Diagnosis present

## 2018-12-29 DIAGNOSIS — K859 Acute pancreatitis without necrosis or infection, unspecified: Secondary | ICD-10-CM | POA: Diagnosis present

## 2018-12-29 DIAGNOSIS — D649 Anemia, unspecified: Secondary | ICD-10-CM | POA: Diagnosis present

## 2018-12-29 DIAGNOSIS — Z8673 Personal history of transient ischemic attack (TIA), and cerebral infarction without residual deficits: Secondary | ICD-10-CM

## 2018-12-29 MED ORDER — SODIUM CHLORIDE 0.9% FLUSH: 3.0000 mL | Freq: Once | INTRAVENOUS | Status: DC

## 2018-12-29 NOTE — ED Triage Notes (Signed)
Pt comes via [facility] for abd pain that has been going on for two days, some nausea yesterday and today. Hx of pancreatitis, feels the same. Pt is a dialysis pt, T Thu Sat

## 2018-12-30 ENCOUNTER — Other Ambulatory Visit: Payer: Self-pay

## 2018-12-30 ENCOUNTER — Observation Stay (HOSPITAL_COMMUNITY): Payer: Medicare Other

## 2018-12-30 ENCOUNTER — Encounter (HOSPITAL_COMMUNITY): Payer: Self-pay | Admitting: General Practice

## 2018-12-30 DIAGNOSIS — Z90710 Acquired absence of both cervix and uterus: Secondary | ICD-10-CM | POA: Diagnosis not present

## 2018-12-30 DIAGNOSIS — N2581 Secondary hyperparathyroidism of renal origin: Secondary | ICD-10-CM | POA: Diagnosis present

## 2018-12-30 DIAGNOSIS — K219 Gastro-esophageal reflux disease without esophagitis: Secondary | ICD-10-CM | POA: Diagnosis present

## 2018-12-30 DIAGNOSIS — I120 Hypertensive chronic kidney disease with stage 5 chronic kidney disease or end stage renal disease: Secondary | ICD-10-CM | POA: Diagnosis present

## 2018-12-30 DIAGNOSIS — Z20828 Contact with and (suspected) exposure to other viral communicable diseases: Secondary | ICD-10-CM | POA: Diagnosis present

## 2018-12-30 DIAGNOSIS — I10 Essential (primary) hypertension: Secondary | ICD-10-CM

## 2018-12-30 DIAGNOSIS — Z992 Dependence on renal dialysis: Secondary | ICD-10-CM | POA: Diagnosis not present

## 2018-12-30 DIAGNOSIS — N289 Disorder of kidney and ureter, unspecified: Secondary | ICD-10-CM

## 2018-12-30 DIAGNOSIS — Z8673 Personal history of transient ischemic attack (TIA), and cerebral infarction without residual deficits: Secondary | ICD-10-CM | POA: Diagnosis not present

## 2018-12-30 DIAGNOSIS — N186 End stage renal disease: Secondary | ICD-10-CM | POA: Diagnosis present

## 2018-12-30 DIAGNOSIS — K279 Peptic ulcer, site unspecified, unspecified as acute or chronic, without hemorrhage or perforation: Secondary | ICD-10-CM

## 2018-12-30 DIAGNOSIS — B182 Chronic viral hepatitis C: Secondary | ICD-10-CM | POA: Diagnosis present

## 2018-12-30 DIAGNOSIS — E785 Hyperlipidemia, unspecified: Secondary | ICD-10-CM | POA: Diagnosis present

## 2018-12-30 DIAGNOSIS — K8500 Idiopathic acute pancreatitis without necrosis or infection: Secondary | ICD-10-CM | POA: Diagnosis present

## 2018-12-30 DIAGNOSIS — K861 Other chronic pancreatitis: Secondary | ICD-10-CM | POA: Diagnosis present

## 2018-12-30 DIAGNOSIS — Z79899 Other long term (current) drug therapy: Secondary | ICD-10-CM | POA: Diagnosis not present

## 2018-12-30 DIAGNOSIS — D649 Anemia, unspecified: Secondary | ICD-10-CM | POA: Diagnosis present

## 2018-12-30 DIAGNOSIS — Z87891 Personal history of nicotine dependence: Secondary | ICD-10-CM | POA: Diagnosis not present

## 2018-12-30 LAB — LACTIC ACID, PLASMA
Lactic Acid, Venous: 1 mmol/L (ref 0.5–1.9)
Lactic Acid, Venous: 0.7 mmol/L (ref 0.5–1.9)

## 2018-12-30 LAB — COMPREHENSIVE METABOLIC PANEL
ALT: 22 U/L (ref 0–44)
AST: 23 U/L (ref 15–41)
Albumin: 3.6 g/dL (ref 3.5–5.0)
Alkaline Phosphatase: 79 U/L (ref 38–126)
Anion gap: 15 (ref 5–15)
BUN: 45 mg/dL — ABNORMAL HIGH (ref 8–23)
CO2: 26 mmol/L (ref 22–32)
Calcium: 9.7 mg/dL (ref 8.9–10.3)
Chloride: 97 mmol/L — ABNORMAL LOW (ref 98–111)
Creatinine, Ser: 9 mg/dL — ABNORMAL HIGH (ref 0.44–1.00)
GFR calc Af Amer: 5 mL/min — ABNORMAL LOW (ref >60)
GFR calc non Af Amer: 4 mL/min — ABNORMAL LOW (ref >60)
Glucose, Bld: 91 mg/dL (ref 70–99)
Potassium: 4.9 mmol/L (ref 3.5–5.1)
Sodium: 138 mmol/L (ref 135–145)
Total Bilirubin: 0.4 mg/dL (ref 0.3–1.2)
Total Protein: 7.5 g/dL (ref 6.5–8.1)

## 2018-12-30 LAB — CBC
HCT: 36.2 % (ref 36.0–46.0)
Hemoglobin: 11.9 g/dL — ABNORMAL LOW (ref 12.0–15.0)
MCH: 31.1 pg (ref 26.0–34.0)
MCHC: 32.9 g/dL (ref 30.0–36.0)
MCV: 94.5 fL (ref 80.0–100.0)
Platelets: 309 10*3/uL (ref 150–400)
RBC: 3.83 MIL/uL — ABNORMAL LOW (ref 3.87–5.11)
RDW: 14.7 % (ref 11.5–15.5)
WBC: 8.4 10*3/uL (ref 4.0–10.5)
nRBC: 0 % (ref 0.0–0.2)

## 2018-12-30 LAB — MRSA PCR SCREENING: MRSA by PCR: NEGATIVE

## 2018-12-30 LAB — SARS CORONAVIRUS 2 BY RT PCR (HOSPITAL ORDER, PERFORMED IN ~~LOC~~ HOSPITAL LAB): SARS Coronavirus 2: NEGATIVE

## 2018-12-30 LAB — LIPASE, BLOOD: Lipase: 134 U/L — ABNORMAL HIGH (ref 11–51)

## 2018-12-30 MED ORDER — FENTANYL CITRATE (PF) 100 MCG/2ML IJ SOLN
50.0000 ug | Freq: Once | INTRAMUSCULAR | Status: AC
  Administered 2018-12-30: 50 ug via INTRAVENOUS
  Filled 2018-12-30: qty 2

## 2018-12-30 MED ORDER — HYDRALAZINE HCL 25 MG PO TABS
25.0000 mg | ORAL_TABLET | Freq: Three times a day (TID) | ORAL | Status: DC
  Administered 2018-12-31 – 2019-01-04 (×13): 25 mg via ORAL
  Filled 2018-12-30 (×15): qty 1

## 2018-12-30 MED ORDER — HEPARIN SODIUM (PORCINE) 5000 UNIT/ML IJ SOLN
5000.0000 [IU] | Freq: Three times a day (TID) | INTRAMUSCULAR | Status: DC
  Administered 2018-12-30 – 2019-01-04 (×15): 5000 [IU] via SUBCUTANEOUS
  Filled 2018-12-30 (×15): qty 1

## 2018-12-30 MED ORDER — ACETAMINOPHEN 650 MG RE SUPP: 650.0000 mg | Freq: Four times a day (QID) | RECTAL | Status: DC | PRN

## 2018-12-30 MED ORDER — AMLODIPINE BESYLATE 10 MG PO TABS
10.0000 mg | ORAL_TABLET | Freq: Every day | ORAL | Status: DC
  Administered 2018-12-30 – 2019-01-04 (×6): 10 mg via ORAL
  Filled 2018-12-30: qty 2
  Filled 2018-12-30 (×5): qty 1

## 2018-12-30 MED ORDER — MORPHINE SULFATE (PF) 2 MG/ML IV SOLN
2.0000 mg | INTRAVENOUS | Status: DC | PRN
  Administered 2018-12-30: 4 mg via INTRAVENOUS
  Filled 2018-12-30: qty 2

## 2018-12-30 MED ORDER — SODIUM CHLORIDE 0.9 % IV SOLN: Freq: Once | INTRAVENOUS | Status: DC

## 2018-12-30 MED ORDER — HYDROMORPHONE HCL 1 MG/ML IJ SOLN
0.5000 mg | INTRAMUSCULAR | Status: DC | PRN
  Administered 2018-12-30 – 2019-01-03 (×21): 0.5 mg via INTRAVENOUS
  Filled 2018-12-30 (×21): qty 1

## 2018-12-30 MED ORDER — OXYCODONE HCL 5 MG PO TABS
5.0000 mg | ORAL_TABLET | ORAL | Status: DC | PRN
  Administered 2018-12-30 – 2019-01-04 (×12): 5 mg via ORAL
  Filled 2018-12-30 (×13): qty 1

## 2018-12-30 MED ORDER — SODIUM CHLORIDE 0.9 % IV BOLUS
1000.0000 mL | Freq: Once | INTRAVENOUS | Status: AC
  Administered 2018-12-30: 05:00:00 1000 mL via INTRAVENOUS

## 2018-12-30 MED ORDER — ONDANSETRON HCL 4 MG PO TABS: 4.0000 mg | ORAL_TABLET | Freq: Four times a day (QID) | ORAL | Status: DC | PRN

## 2018-12-30 MED ORDER — ATORVASTATIN CALCIUM 40 MG PO TABS
40.0000 mg | ORAL_TABLET | Freq: Every day | ORAL | Status: DC
  Administered 2018-12-31 – 2019-01-03 (×4): 40 mg via ORAL
  Filled 2018-12-30 (×4): qty 1

## 2018-12-30 MED ORDER — ACETAMINOPHEN 325 MG PO TABS: 650.0000 mg | ORAL_TABLET | Freq: Four times a day (QID) | ORAL | Status: DC | PRN

## 2018-12-30 MED ORDER — ONDANSETRON HCL 4 MG/2ML IJ SOLN: 4.0000 mg | Freq: Four times a day (QID) | INTRAMUSCULAR | Status: DC | PRN

## 2018-12-30 MED ORDER — LIDOCAINE VISCOUS HCL 2 % MT SOLN
15.0000 mL | Freq: Once | OROMUCOSAL | Status: AC
  Administered 2018-12-30: 15 mL via ORAL
  Filled 2018-12-30: qty 15

## 2018-12-30 MED ORDER — PANTOPRAZOLE SODIUM 40 MG PO TBEC
40.0000 mg | DELAYED_RELEASE_TABLET | Freq: Every day | ORAL | Status: DC
  Administered 2018-12-30 – 2019-01-04 (×6): 40 mg via ORAL
  Filled 2018-12-30 (×6): qty 1

## 2018-12-30 MED ORDER — ALUM & MAG HYDROXIDE-SIMETH 200-200-20 MG/5ML PO SUSP
30.0000 mL | Freq: Once | ORAL | Status: AC
  Administered 2018-12-30: 30 mL via ORAL
  Filled 2018-12-30: qty 30

## 2018-12-30 MED ORDER — CHLORHEXIDINE GLUCONATE CLOTH 2 % EX PADS: 6.0000 | MEDICATED_PAD | Freq: Every day | CUTANEOUS | Status: DC

## 2018-12-30 NOTE — Plan of Care (Signed)
  Problem: Education: Goal: Knowledge of General Education information will improve Description Including pain rating scale, medication(s)/side effects and non-pharmacologic comfort measures Outcome: Progressing   

## 2018-12-30 NOTE — ED Notes (Signed)
WT 150.1

## 2018-12-30 NOTE — Consult Note (Signed)
Renal Service Consult Note [facility]  Ashley [name] 12/30/2018 Ashley [name] Requesting Physician:  Dr Ashley Crane, C.   Reason for Consult:  ESRD pt w/ abd pain HPI: The patient is a 68 [name].o. year-old with hx of CVA, pancreatitis, HTN, HL, hep C, drug abuse has been on HD since Jan 2019, presented to ED with abd pain for two days.  H/o pancreatitis , pain is similar. No n/v or diarreha, no fevers.  Has not missed HD.  Asked to see for esrd.   Lipase is 134.  No SIRS in ED.  Pt's pain better after pain medication.  COVID negative. Pt has good HD compliacne.      ROS  denies CP  no joint pain   no HA  no blurry vision  no rash    Past Medical History  Past Medical History:  Diagnosis Date  . Acute kidney injury (Ashley [name]) 05/24/2013  . Arthritis   . Cervical radiculopathy 02/28/2011  . Chronic renal failure   . Cocaine substance abuse ([place]) 05/26/2013   positive UDS   . Gastropathy   . GERD (gastroesophageal reflux disease)   . Hepatitis C 1987   history of IVDA  . Hiatal hernia   . Hyperlipidemia   . Hypertension   . Marijuana abuse 12/18/204   positive UDS, family members smoke as well  . Pancreatitis 2000   resolved  . 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 Schatzki's ring   . Stroke ([place])   . Ulcer 1990   gastric ulcer. Ruptured s/p emergency repair   Past Surgical History  Past Surgical History:  Procedure Laterality Date  . ABDOMINAL HYSTERECTOMY  1979  . AV FISTULA PLACEMENT Left 06/16/2017   Procedure: ARTERIOVENOUS (AV) FISTULA CREATION LEFT ARM;  Surgeon: Ashley Baidland, [name];  Location: [place];  Service: Vascular;  Laterality: Left;  . BASCILIC VEIN TRANSPOSITION Left 10/02/2017   Procedure: BASILIC VEIN TRANSPOSITION SECOND STAGE LEFT ARM;  Surgeon: Ashley Posner, [name];  [facility] OR;  Service: Vascular;  Laterality: Left;  . ESOPHAGOGASTRODUODENOSCOPY N/A 05/29/2013   Procedure: ESOPHAGOGASTRODUODENOSCOPY (EGD);  Surgeon: Ashley Bears, [name];   Location: [facility] ENDOSCOPY;  Service: Endoscopy;  Laterality: N/A;  . EXCHANGE OF A DIALYSIS CATHETER Left 07/31/2017   Procedure: Removal  OF A  Right GroinTUNNELED  DIALYSIS CATHETER ,  Insertion of Left Femoral Dialysis Catheter.;  Surgeon: Ashley Posner, [name];  Location: [place];  Service: Vascular;  Laterality: Left;  . INSERTION OF DIALYSIS CATHETER Right 06/16/2017   Procedure: INSERTION OF DIALYSIS CATHETER;  Surgeon: Ashley Orchard, [name];  Location: [place];  Service: Vascular;  Laterality: Right;  . IR AV DIALY SHUNT INTRO [place] W/PTA/IMG LEFT  06/21/2018  . REPAIR OF PERFORATED ULCER     Family History  Family History  Problem Relation Age of Onset  . Hypertension Father   . Cancer Father   . Hyperlipidemia Father   . Seizures Sister   . Early death Daughter   . Kidney disease Daughter        end stage dialysis dependent    Social History  reports that she has quit smoking. Her smoking use included cigarettes. She has a 10.00 pack-year smoking history. She has never used smokeless tobacco. She reports current drug use. Drugs: Heroin and Marijuana. She reports that she does not drink alcohol. Allergies  Allergies  Allergen Reactions  . Aspirin Nausea And Vomiting    Stomach ache  .  Ibuprofen Nausea And Vomiting    Stomach ache   Home medications Prior to Admission medications   Medication Sig Start Date End Date Taking? Authorizing Provider  amLODipine (NORVASC) 10 MG tablet Take 1 tablet (10 mg total) by mouth daily. 11/11/16  Yes Ashley Blare [name], [name]  atorvastatin (LIPITOR) 40 MG tablet Take 1 tablet (40 mg total) by mouth daily at 6 PM. 04/04/18  Yes Ashley [name]  hydrALAZINE (APRESOLINE) 25 MG tablet Take 1 tablet (25 mg total) by mouth every 8 (eight) hours. 07/02/17  Yes Ashley [name]  pantoprazole (PROTONIX) 40 MG tablet Take 1 tablet (40 mg total) by mouth daily. Patient not taking: Reported on 12/30/2018 07/02/17   Ashley [name]   Liver  Function Tests Recent Labs  Lab 12/29/18 2258  AST 23  ALT 22  ALKPHOS 79  BILITOT 0.4  PROT 7.5  ALBUMIN 3.6   Recent Labs  Lab 12/29/18 2258  LIPASE 134*   CBC Recent Labs  Lab 12/29/18 2258  WBC 8.4  HGB 11.9*  HCT 36.2  MCV 94.5  PLT 573   Basic Metabolic Panel Recent Labs  Lab 12/29/18 2258  NA 138  K 4.9  CL 97*  CO2 26  GLUCOSE 91  BUN 45*  CREATININE 9.00*  CALCIUM 9.7   Iron/TIBC/Ferritin/ %Sat    Component Value Date/Time   IRON 28 06/21/2017 1055   TIBC 196 (L) 06/21/2017 1055   IRONPCTSAT 14 06/21/2017 1055    Vitals:   12/30/18 1115 12/30/18 1130 12/30/18 1145 12/30/18 1213  BP: (!) 167/90 (!) 160/96  (!) 165/83  Pulse:   86 73  Resp:    18  Temp:    (!) 97.5 F (36.4 C)  TempSrc:    Oral  SpO2:  93% 95% 100%  Weight:    68.4 kg  Height:    5' 2.5" (1.588 m)    Exam Gen alert, no distress, calm elderly AAF, ambulates w/o difficulty No rash, cyanosis or gangrene Sclera anicteric, throat clear  No jvd or bruits Chest clear bilat to the bases RRR no RG, soft SEM Abd soft mod distended, nontender, no mass or ascites +bs GU defer MS no joint effusions or deformity Ext no LE or UE edema, no wounds or ulcers Neuro is alert, Ox 3 , nf LUA AVF +bruit    Home meds:  - amlodipine 10/ hydralazine 25 tid  - pantoprazole 40/ atorvastatin 40 hs    Outpt HD: TTS Ashley  [name]  2/2 bath   66.5kg   350/800  LUA AVF  P4  Hep 2000 +1000prn  - mircera 30 on 7/7  - hect 2 ug     Assessment/ Plan: 1. Abd pain - suspected pancreatitis, Abd CT pending. Per primary 2. ESRD - on HD TTS. Plan HD today.  3. HTN/ vol - cont BP meds as needed. 2kg up. UF 1- 2kg on HD today 4. H/o CVA 5. H/o hep C 6. Anemia ckd - Hb > 11, no esa for now. Last was on 7/7 7. MBD ckd - con tmeds      Ashley Splinter  [name] 12/30/2018, 12:43 PM

## 2018-12-30 NOTE — ED Notes (Signed)
The pt went to the br  The staff taking her id not knownthat the pt needed a specimen  Not saved

## 2018-12-30 NOTE — H&P (Signed)
History and Physical    Ashley [name]:[id number] DOB: 12/24/1950 DOA: 12/29/2018  PCP: Ladell Pier, MD  Patient coming from: Home  I have personally briefly reviewed patient's old medical records in [place]  Chief Complaint: Abd pain  HPI: Ashley [name] is a 68 y.o. female with medical history significant of prior substance abuse in past, ESRD on TTS dialysis, HTN.  Patient had mild acute pancreatitis in May of this year, uncomplicated and ultimately idiopathic as work up didn't reveal an obvious cause at that time.  Today she presents to the ED with c/o 2 day history of epigastric abd pain.  Gradually worsening.  Some associated nausea.  No vomiting no diarrhea.  Feels identical to pancreatitis pain she had back in May.  No fever, no cough, no SOB, no dysuria, no CP.  No EtOH use.  Does still make urine about 2 times a day she reports.   ED Course: Lipase 134.  No SIRS.  Given Fentanyl and IVF with slight improvement in pain.  COVID pending.   Review of Systems: As per HPI, otherwise all review of systems negative.  Past Medical History:  Diagnosis Date  . Acute kidney injury ([place]) 05/24/2013  . Arthritis   . Cervical radiculopathy 02/28/2011  . Chronic renal failure   . Cocaine substance abuse ([place]) 05/26/2013   positive UDS   . Gastropathy   . GERD (gastroesophageal reflux disease)   . Hepatitis C 1987   history of IVDA  . Hiatal hernia   . Hyperlipidemia   . Hypertension   . Marijuana abuse 12/18/204   positive UDS, family members smoke as well  . Pancreatitis 2000   resolved  . 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 Schatzki's ring   . Stroke ([place])   . Ulcer 1990   gastric ulcer. Ruptured s/p emergency repair    Past Surgical History:  Procedure Laterality Date  . ABDOMINAL HYSTERECTOMY  1979  . AV FISTULA PLACEMENT Left 06/16/2017   Procedure: ARTERIOVENOUS (AV) FISTULA CREATION LEFT ARM;  Surgeon: Conrad [place], MD;  Location[facility];  Service: Vascular;  Laterality: Left;  . BASCILIC VEIN TRANSPOSITION Left 10/02/2017   Procedure: BASILIC VEIN TRANSPOSITION SECOND STAGE LEFT ARM;  Surgeon: Rosetta Posner, MD;  Location: [facility] OR;  Service: Vascular;  Laterality: Left;  . ESOPHAGOGASTRODUODENOSCOPY N/A 05/29/2013   Procedure: ESOPHAGOGASTRODUODENOSCOPY (EGD);  Surgeon: Jerene Bears, MD;  Location: [facility] ENDOSCOPY;  Service: Endoscopy;  Laterality: N/A;  . EXCHANGE OF A DIALYSIS CATHETER Left 07/31/2017   Procedure: Removal  OF A  Right GroinTUNNELED  DIALYSIS CATHETER ,  Insertion of Left Femoral Dialysis Catheter.;  Surgeon: Rosetta Posner, MD;  Location: [place];  Service: Vascular;  Laterality: Left;  . INSERTION OF DIALYSIS CATHETER Right 06/16/2017   Procedure: INSERTION OF DIALYSIS CATHETER;  Surgeon: Conrad [place], MD;  Location: [place];  Service: Vascular;  Laterality: Right;  . IR AV DIALY SHUNT INTRO [place] W/PTA/IMG LEFT  06/21/2018  . REPAIR OF PERFORATED ULCER       reports that she has quit smoking. Her smoking use included cigarettes. She has a 10.00 pack-year smoking history. She has never used smokeless tobacco. She reports current drug use. Drugs: Heroin and Marijuana. She reports that she does not drink alcohol.  Allergies  Allergen Reactions  . Aspirin Nausea And Vomiting    Stomach ache  . Ibuprofen Nausea And Vomiting    Stomach ache  Family History  Problem Relation Age of Onset  . Hypertension Father   . Cancer Father   . Hyperlipidemia Father   . Seizures Sister   . Early death Daughter   . Kidney disease Daughter        end stage dialysis dependent      Prior to Admission medications   Medication Sig Start Date End Date Taking? Authorizing Provider  amLODipine (NORVASC) 10 MG tablet Take 1 tablet (10 mg total) by mouth daily. 11/11/16  Yes Luiz Blare Y, DO  atorvastatin (LIPITOR) 40 MG tablet Take 1 tablet (40 mg total) by mouth daily at 6 PM. 04/04/18  Yes Ladell Pier, MD  hydrALAZINE (APRESOLINE) 25 MG tablet Take 1 tablet (25 mg total) by mouth every 8 (eight) hours. 07/02/17  Yes Rory Percy, DO  pantoprazole (PROTONIX) 40 MG tablet Take 1 tablet (40 mg total) by mouth daily. Patient not taking: Reported on 12/30/2018 07/02/17   Rory Percy, DO    Physical Exam: Vitals:   12/29/18 2258 12/30/18 0158 12/30/18 0315 12/30/18 0330  BP: (!) 159/88 (!) 157/81 (!) 156/76 137/66  Pulse: 85 82 82 79  Resp: 18 18    Temp: 98.2 F (36.8 C) 97.7 F (36.5 C)    TempSrc: Oral Oral    SpO2: 99% 100% 97% 95%    Constitutional: NAD, calm, comfortable Eyes: PERRL, lids and conjunctivae normal ENMT: Mucous membranes are moist. Posterior pharynx clear of any exudate or lesions.Normal dentition.  Neck: normal, supple, no masses, no thyromegaly Respiratory: clear to auscultation bilaterally, no wheezing, no crackles. Normal respiratory effort. No accessory muscle use.  Cardiovascular: Regular rate and rhythm, no murmurs / rubs / gallops. No extremity edema. 2+ pedal pulses. No carotid bruits.  Abdomen: no tenderness, no masses palpated. No hepatosplenomegaly. Bowel sounds positive.  Musculoskeletal: no clubbing / cyanosis. No joint deformity upper and lower extremities. Good ROM, no contractures. Normal muscle tone.  Skin: no rashes, lesions, ulcers. No induration Neurologic: CN 2-12 grossly intact. Sensation intact, DTR normal. Strength 5/5 in all 4.  Psychiatric: Normal judgment and insight. Alert and oriented x 3. Normal mood.    Labs on Admission: I have personally reviewed following labs and imaging studies  CBC: Recent Labs  Lab 12/29/18 2258  WBC 8.4  HGB 11.9*  HCT 36.2  MCV 94.5  PLT 387   Basic Metabolic Panel: Recent Labs  Lab 12/29/18 2258  NA 138  K 4.9  CL 97*  CO2 26  GLUCOSE 91  BUN 45*  CREATININE 9.00*  CALCIUM 9.7   GFR: CrCl cannot be calculated (Unknown ideal weight.). Liver Function Tests: Recent Labs   Lab 12/29/18 2258  AST 23  ALT 22  ALKPHOS 79  BILITOT 0.4  PROT 7.5  ALBUMIN 3.6   Recent Labs  Lab 12/29/18 2258  LIPASE 134*   No results for input(s): AMMONIA in the last 168 hours. Coagulation Profile: No results for input(s): INR, PROTIME in the last 168 hours. Cardiac Enzymes: No results for input(s): CKTOTAL, CKMB, CKMBINDEX, TROPONINI in the last 168 hours. BNP (last 3 results) No results for input(s): PROBNP in the last 8760 hours. HbA1C: No results for input(s): HGBA1C in the last 72 hours. CBG: No results for input(s): GLUCAP in the last 168 hours. Lipid Profile: No results for input(s): CHOL, HDL, LDLCALC, TRIG, CHOLHDL, LDLDIRECT in the last 72 hours. Thyroid Function Tests: No results for input(s): TSH, T4TOTAL, FREET4, T3FREE, THYROIDAB in the last 72  hours. Anemia Panel: No results for input(s): VITAMINB12, FOLATE, FERRITIN, TIBC, IRON, RETICCTPCT in the last 72 hours. Urine analysis:    Component Value Date/Time   COLORURINE YELLOW 06/19/2017 1820   APPEARANCEUR HAZY (A) 06/19/2017 1820   LABSPEC 1.013 06/19/2017 1820   PHURINE 5.0 06/19/2017 1820   GLUCOSEU 50 (A) 06/19/2017 1820   HGBUR SMALL (A) 06/19/2017 1820   BILIRUBINUR NEGATIVE 06/19/2017 1820   BILIRUBINUR NEG 01/29/2016 1142   KETONESUR NEGATIVE 06/19/2017 1820   PROTEINUR 100 (A) 06/19/2017 1820   UROBILINOGEN 0.2 01/29/2016 1142   UROBILINOGEN 0.2 03/21/2015 1649   NITRITE NEGATIVE 06/19/2017 1820   LEUKOCYTESUR TRACE (A) 06/19/2017 1820    Radiological Exams on Admission: No results found.  EKG: Independently reviewed.  Assessment/Plan Principal Problem:   Acute pancreatitis Active Problems:   Peptic ulcer disease   Hypertension   ESRD (end stage renal disease) on dialysis [facility])   History of cardioembolic cerebrovascular accident (CVA)   Lesion of left native kidney    1. Epigastric abd pain, likely representing recurrent acute pancreatitis - 1. Mild lipase elevation  similar to May noted, HPI similar to May, cause was felt to be idiopathic back in May 2. Getting confirmatory CT abd/pelvis w/o contrast and to see if any complication (ie pseudocyst formation). 3. Check lactate 4. IVF: do want to treat acute pancreatitis with IVF since she has poor PO intake, but conservatively since she is a dialysis patient 1. got ~300cc bolus in ED (not the 1L that was initially ordered), will run the remaining ~700cc in at 75cc/hr for now, let nephro decide on maint rate when they see her today for routine dialysis. 5. No SIRS at this time to suggest a need for ABx 6. Morphine PRN pain 7. Zofran PRN nausea 8. UA pending 2. PUD - Resume PPI that she wasn't taking 3. HTN - Continue amlodipine and hydralazine 4. H/o Stroke - 1. continue BP meds and statin 2. Dont see ASA on her med rec, did someone intentionally stop this?  Maybe PCP due to h/o PUD including perforated ulcer? (PCP noted she wasn't on it during office visit Oct 2019).  Will hold off on ordering for now in setting of acute epigastric abdominal pain. 5. ESRD - 1. Dialysis TTS 2. Due for routine dialysis today, call nephro in AM 3. Looks like phosphate binders also have disappeared from her med rec despite h/o metabolic bone disease.  Was on Auryxia back in May, will defer re-ordering of these to nephro though. 6. L kidney nodule - 1. Was 3mm back in May 2. Follow up on size on todays CT scan after its performed.  DVT prophylaxis: Heparin [place] Code Status: Full Family Communication: No family in room Disposition Plan: Home after admit Consults called: None, call nephro in AM Admission status: Place in obs - convert to IP when not discharged today (as I doubt she will be)   Alcario Drought, [place] [facility]  How to contact the [facility] Attending or Consulting provider [place] or covering provider during after hours [place], for this patient?  1. Check the care team in [facility] and look for a)  attending/consulting [facility] provider listed and b) the [facility] team listed 2. Log into [email or web address]  Amion Physician Scheduling and messaging for groups and whole hospitals  On call and physician scheduling software for group practices, residents, hospitalists and other medical providers for call, clinic, rotation and shift schedules. OnCall [employer] is a hospital-wide system  for scheduling doctors and paging doctors on call. EasyPlot is for scientific plotting and data analysis.  [email or web address]  and use [place]'s universal password to access. If you do not have the password, please contact the hospital operator.  3. Locate the [facility] provider you are looking for under [facility] and page to a number that you can be directly reached. 4. If you still have difficulty reaching the provider, please page the [facility] (Director on Call) for the Hospitalists listed on amion for assistance.  12/30/2018, 6:08 AM

## 2018-12-30 NOTE — Plan of Care (Signed)
  Problem: Pain Managment: Goal: General experience of comfort will improve Outcome: Progressing   

## 2018-12-30 NOTE — Progress Notes (Signed)
New Admission Note:   Arrival Method: from ED via stretcher Mental Orientation:  A&Ox4 Telemetry: none Assessment: Completed, refer to chart Skin: Intact IV: RH saline locked Pain: 7/10 Tubes: None Safety Measures: Safety Fall Prevention Plan has been discussed  Admission: to be completed 5 [place] Orientation: Patient has been orientated to the room, unit and staff.   Family: none at bedside  Orders to be reviewed and implemented. Will continue to monitor the patient. Call light has been placed within reach and bed alarm has been activated.

## 2018-12-30 NOTE — ED Notes (Signed)
Pt reports that her pain is still there in her abd

## 2018-12-30 NOTE — Progress Notes (Signed)
Ashley [name] is a 68 y.o. female with medical history significant of prior pancreatitis, ESRD on HD TTS, HTN, who presented with significant epigastric abdominal pain of 2 days duration and was found to have elevated lipase.  Physical exam is suggestive of acute pancreatitis.  CT abdomen and pelvis without contrast done on admission is pending.  Denies vomiting or diarrhea.  Denies alcohol use.  She likes to eat greasy food.  Symptoms feel identical to prior pancreatitis.  She states she still makes urine about 2 times a day.  Patient had mild acute pancreatitis in May of this year, uncomplicated and ultimately idiopathic as work up didn't reveal an obvious cause at that time.  12/30/18: Patient was seen and examined at her bedside in the ED.  Epigastric abdominal pain is about a 7 out of 10 nonradiating.  Will DC IV fluid due to ESRD on HD and switch IV pain medication to Dilaudid.  Please refer to H&P dictated by Ashley [name] on 12/30/2018 for further details of the assessment and plan.
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
## 2018-12-30 NOTE — Progress Notes (Signed)
Renal Navigator notified OP HD clinic/[facility] of patient's negative COVID 19 rapid test result to provide continuity of care and safety.  Alphonzo Cruise, [place] Renal Navigator [phone]

## 2018-12-30 NOTE — ED Notes (Signed)
Dr gardner at bedside  

## 2018-12-30 NOTE — ED Notes (Signed)
abd pain for 2 days last dialysis was weddnesday  No n v or diarrhea

## 2018-12-30 NOTE — Progress Notes (Signed)
Renal Navigator notified by patient's RN that she has been admitted. Renal Navigator spoke with Dr. Schertz/Nephrologist, and therefore, patient will be dialyzed in hospital today and Renal Navigator has cancelled her OP HD treatment for tomorrow at [facility] clinic and her SCAT transportation to appointment tomorrow.  Alphonzo Cruise,  Renal Navigator [phone]

## 2018-12-30 NOTE — Progress Notes (Signed)
Renal Navigator notified by Renal PA/L. Penninger requesting rescheduled HD treatment for tomorrow, 12/31/18. Renal Navigator obtained rescheduled HD appointment [facility] tomorrow, 12/31/18 at 12:00pm, with 11:45am arrival time.  Renal Navigator arranged [facility] transportation for this appointment from her home with a 10:25am-10:55am pick up window and back to her home with a 4:00pm-4:30pm pick up window from the clinic.  If patient remains in the hospital in the morning of 12/31/18, other arrangements for transportation to the clinic will need to [place] will monitor and assist.  Alphonzo Cruise, [place] Renal Navigator [phone]

## 2018-12-31 LAB — CBC
HCT: 38.2 % (ref 36.0–46.0)
Hemoglobin: 12.5 g/dL (ref 12.0–15.0)
MCH: 30.9 pg (ref 26.0–34.0)
MCHC: 32.7 g/dL (ref 30.0–36.0)
MCV: 94.3 fL (ref 80.0–100.0)
Platelets: 310 10*3/uL (ref 150–400)
RBC: 4.05 MIL/uL (ref 3.87–5.11)
RDW: 14.1 % (ref 11.5–15.5)
WBC: 6.3 10*3/uL (ref 4.0–10.5)
nRBC: 0 % (ref 0.0–0.2)

## 2018-12-31 LAB — URINALYSIS, ROUTINE W REFLEX MICROSCOPIC
Bacteria, UA: NONE SEEN
Bilirubin Urine: NEGATIVE
Glucose, UA: NEGATIVE mg/dL
Ketones, ur: NEGATIVE mg/dL
Leukocytes,Ua: NEGATIVE
Nitrite: NEGATIVE
Protein, ur: >=300 mg/dL — AB
Specific Gravity, Urine: 1.008 (ref 1.005–1.030)
pH: 9 — ABNORMAL HIGH (ref 5.0–8.0)

## 2018-12-31 LAB — LIPASE, BLOOD: Lipase: 63 U/L — ABNORMAL HIGH (ref 11–51)

## 2018-12-31 MED ORDER — DOXERCALCIFEROL 0.5 MCG PO CAPS
2.0000 ug | ORAL_CAPSULE | ORAL | Status: DC
  Administered 2019-01-01: 2 ug via ORAL
  Filled 2018-12-31 (×2): qty 4

## 2018-12-31 NOTE — Progress Notes (Addendum)
[facility] Progress Note   Subjective:  Seen in room - still with abd pain, but improved slightly. No vomiting. No CP/dyspnea.  Objective Vitals:   12/31/18 0300 12/31/18 0330 12/31/18 0400 12/31/18 0434  BP: (!) 138/95 (!) 155/89 (!) 143/65 (!) 153/72  Pulse: (!) 107 (!) 106 99 61  Resp:    16  Temp:   98.5 F (36.9 C) 98.3 F (36.8 C)  TempSrc:   Oral Oral  SpO2:   95% 97%  Weight:   66.3 kg   Height:       Physical Exam General: Frail woman, NAD. Heart: RRR; 2/6 systolic murmur Lungs: CTAB Abdomen: soft, mild generalized tenderness Extremities: No LE edema Dialysis Access: LUE AVF + thrill  Additional Objective Labs: Basic Metabolic Panel: Recent Labs  Lab 12/29/18 2258  NA 138  K 4.9  CL 97*  CO2 26  GLUCOSE 91  BUN 45*  CREATININE 9.00*  CALCIUM 9.7   Liver Function Tests: Recent Labs  Lab 12/29/18 2258  AST 23  ALT 22  ALKPHOS 79  BILITOT 0.4  PROT 7.5  ALBUMIN 3.6   Recent Labs  Lab 12/29/18 2258  LIPASE 134*   CBC: Recent Labs  Lab 12/29/18 2258 12/31/18 0607  WBC 8.4 6.3  HGB 11.9* 12.5  HCT 36.2 38.2  MCV 94.5 94.3  PLT 309 310   Blood Culture    Component Value Date/Time   SDES URINE, RANDOM 06/20/2017 2222   SPECREQUEST NONE 06/20/2017 2222   CULT MULTIPLE SPECIES PRESENT, SUGGEST RECOLLECTION (A) 06/20/2017 2222   REPTSTATUS 06/22/2017 FINAL 06/20/2017 2222   Studies/Results: Ct Abdomen Pelvis Wo Contrast  Result Date: 12/30/2018 CLINICAL DATA:  Abdominal pain.  Suspected pancreatitis. EXAM: CT ABDOMEN AND PELVIS WITHOUT CONTRAST TECHNIQUE: Multidetector CT imaging of the abdomen and pelvis was performed following the standard protocol without IV contrast. COMPARISON:  CT abdomen pelvis dated Oct 14, 2018. FINDINGS: Lower chest: No acute abnormality. Bibasilar pleural-parenchymal scarring. Hepatobiliary: No focal liver abnormality is seen. No gallstones, gallbladder wall thickening, or biliary dilatation.  Pancreas: Mild edema and stranding around the pancreatic head. Previously seen edema around the pancreatic body and tail has resolved. No ductal dilatation. Spleen: Normal in size without focal abnormality. Adrenals/Urinary Tract: The adrenal glands are unremarkable. Unchanged bilateral renal atrophy. No renal calculi or hydronephrosis. The bladder is unremarkable. Stomach/Bowel: Stomach is within normal limits. Appendix appears normal. No evidence of bowel wall thickening, distention, or inflammatory changes. Vascular/Lymphatic: Aortic atherosclerosis. No enlarged abdominal or pelvic lymph nodes. Reproductive: Status post hysterectomy. No adnexal masses. Other: No abdominal wall hernia or abnormality. No abdominopelvic ascites. No pneumoperitoneum. Musculoskeletal: No acute or significant osseous findings. IMPRESSION: 1. Mild edema and stranding around the head of the pancreas, consistent with acute pancreatitis. 2.  Aortic atherosclerosis (ICD10-I70.0). Electronically Signed   By: Titus Dubin M.D.   On: 12/30/2018 08:41   Medications:  . amLODipine  10 mg Oral Daily  . atorvastatin  40 mg Oral q1800  . Chlorhexidine Gluconate Cloth  6 each Topical Q0600  . heparin  5,000 Units Subcutaneous Q8H  . hydrALAZINE  25 mg Oral Q8H  . pantoprazole  40 mg Oral Daily  . sodium chloride flush  3 mL Intravenous Once    Dialysis Orders: TTS [facility] 3.5hr, 2K/2Ca, EDW 66.5kg, 350/800, LUE AVF, Profile 4, Hep 2000 bolus - Mircera 77mcg IV q 2 weeks (last 7/7) - Hectoral 18mcg IV q HD  Assessment/Plan: 1. Pancreatitis: No pseudocyst per  abd CT, +mild inflammation of head of pancreas c/w pancreatitis.  Afebrile. Pain control per primary. 2. ESRD: Continue HD per TTS schedule - next 7/25.  3. HTN/volume: BP reasonable, continue home meds. 4. Anemia: Hgb 12.5 - technically due for ESA, but holding for now since Hgb > 11. 5. Secondary hyperparathyroidism: Ca ok. Resume VDRA with next HD. Phos pending. 6. Hx  CVA 7. Hx Hep C  Ashley Penton, PA-C 12/31/2018, 9:20 AM  [facility] Pager: [phone]  Pt seen, examined and agree w A/P as above.  Kelly Splinter  MD 12/31/2018, 4:04 PM

## 2018-12-31 NOTE — Progress Notes (Signed)
PROGRESS NOTE  Ashley [name] WVP:[id number] DOB: [birth year] DOA: 12/29/2018 PCP: Ladell Pier, MD  HPI/Recap of past 24 hours: Ashley [name] is a 68 y.o. female with medical history significant of prior pancreatitis, ESRD on HD TTS, HTN, who presented with significant epigastric abdominal pain of 2 days duration and was found to have elevated lipase.  Physical exam is suggestive of acute pancreatitis.  CT abdomen and pelvis without contrast done on admission is pending.  Denies vomiting or diarrhea.  Denies alcohol use.  She likes to eat greasy food.  Symptoms feel identical to prior pancreatitis.  She states she still makes urine about 2 times a day.  Patient had mild acute pancreatitis in May of this year, uncomplicated and ultimately idiopathic as work up didn't reveal an obvious cause at that time.  Evidence of acute pancreatitis on CT abdomen and pelvis with elevated lipase level.  Admitted for acute recurrent pancreatitis.   12/31/18: Patient was seen and examined at her bedside.  Had HD last night.  Abdominal pain is persistent but improved.  No nausea.  She inquires about her diet, will start full liquid and advance her diet as tolerated.    Assessment/Plan: Principal Problem:   Acute pancreatitis Active Problems:   Peptic ulcer disease   Hypertension   ESRD (end stage renal disease) on dialysis [facility])   History of cardioembolic cerebrovascular accident (CVA)   Lesion of left native kidney   Acute recurrent pancreatitis Presented with elevated lipase level, epigastric abdominal pain and evidence of pancreatitis on CT abdomen and pelvis States she has had pancreatitis x3 times Continue p.o. oxycodone as needed for moderate pain Continue IV Dilaudid as needed for severe pain She asked about a diet today Started full liquid angiograms done as tolerated Repeat lipase level in the morning  ESRD on HD Tuesday Thursday Saturday Last hemodialysis 12/30/2018 Nephrology  following  Essential hypertension Blood pressure stable Continue amlodipine 10 mg daily and p.o. hydralazine 25 mg 3 times daily Continue to monitor vital signs  Hyperlipidemia Continue Lipitor 40 mg daily  History of peptic ulcer disease Continue PPI, Protonix 40 mg daily  History of left kidney nodule 10 mm in May 2020  Risks: High risk for decompensation due to acute recurrent pancreatitis, ESRD on HD, multiple comorbidities and advanced age.  Patient will require at least 2 midnights for further evaluation and treatment of present condition.    DVT prophylaxis: Heparin [place] 3 times daily Code Status: Full Family Communication: No family in room Disposition Plan: Home after admit Consults called: None, call nephro in AM      Objective: Vitals:   12/31/18 0330 12/31/18 0400 12/31/18 0434 12/31/18 0949  BP: (!) 155/89 (!) 143/65 (!) 153/72 (!) 141/77  Pulse: (!) 106 99 61 90  Resp:   16 18  Temp:  98.5 F (36.9 C) 98.3 F (36.8 C) 98.7 F (37.1 C)  TempSrc:  Oral Oral Oral  SpO2:  95% 97% 95%  Weight:  66.3 kg    Height:        Intake/Output Summary (Last 24 hours) at 12/31/2018 1510 Last data filed at 12/31/2018 1212 Gross per 24 hour  Intake 60 ml  Output 1400 ml  Net -1340 ml   Filed Weights   12/30/18 2015 12/31/18 0046 12/31/18 0400  Weight: 67.5 kg 67.3 kg 66.3 kg    Exam:  . General: 68 y.o. year-old female well developed well nourished in no acute distress.  Alert  and oriented x3. . Cardiovascular: Regular rate and rhythm with no rubs or gallops.  No thyromegaly or JVD noted.   Marland Kitchen Respiratory: Clear to auscultation with no wheezes or rales. Good inspiratory effort. . Abdomen: Soft mildly tender in epigastric region.  Nondistended with normal bowel sounds x4 quadrants. . Musculoskeletal: No lower extremity edema. 2/4 pulses in all 4 extremities. Marland Kitchen Psychiatry: Mood is appropriate for condition and setting   Data Reviewed: CBC: Recent Labs  Lab  12/29/18 2258 12/31/18 0607  WBC 8.4 6.3  HGB 11.9* 12.5  HCT 36.2 38.2  MCV 94.5 94.3  PLT 309 536   Basic Metabolic Panel: Recent Labs  Lab 12/29/18 2258 12/31/18 0607  NA 138 137  K 4.9 4.0  CL 97* 96*  CO2 26 25  GLUCOSE 91 82  BUN 45* 13  CREATININE 9.00* 5.47*  CALCIUM 9.7 9.0   GFR: Estimated Creatinine Clearance: 8.7 mL/min (A) (by C-G formula based on SCr of 5.47 mg/dL (H)). Liver Function Tests: Recent Labs  Lab 12/29/18 2258 12/31/18 0607  AST 23 25  ALT 22 20  ALKPHOS 79 77  BILITOT 0.4 0.8  PROT 7.5 7.7  ALBUMIN 3.6 3.6   Recent Labs  Lab 12/29/18 2258 12/31/18 0607  LIPASE 134* 63*   No results for input(s): AMMONIA in the last 168 hours. Coagulation Profile: No results for input(s): INR, PROTIME in the last 168 hours. Cardiac Enzymes: No results for input(s): CKTOTAL, CKMB, CKMBINDEX, TROPONINI in the last 168 hours. BNP (last 3 results) No results for input(s): PROBNP in the last 8760 hours. HbA1C: No results for input(s): HGBA1C in the last 72 hours. CBG: No results for input(s): GLUCAP in the last 168 hours. Lipid Profile: No results for input(s): CHOL, HDL, LDLCALC, TRIG, CHOLHDL, LDLDIRECT in the last 72 hours. Thyroid Function Tests: No results for input(s): TSH, T4TOTAL, FREET4, T3FREE, THYROIDAB in the last 72 hours. Anemia Panel: No results for input(s): VITAMINB12, FOLATE, FERRITIN, TIBC, IRON, RETICCTPCT in the last 72 hours. Urine analysis:    Component Value Date/Time   COLORURINE YELLOW 12/31/2018 1014   APPEARANCEUR CLEAR 12/31/2018 1014   LABSPEC 1.008 12/31/2018 1014   PHURINE 9.0 (H) 12/31/2018 1014   GLUCOSEU NEGATIVE 12/31/2018 1014   HGBUR SMALL (A) 12/31/2018 1014   BILIRUBINUR NEGATIVE 12/31/2018 1014   BILIRUBINUR NEG 01/29/2016 1142   KETONESUR NEGATIVE 12/31/2018 1014   PROTEINUR >=300 (A) 12/31/2018 1014   UROBILINOGEN 0.2 01/29/2016 1142   UROBILINOGEN 0.2 03/21/2015 1649   NITRITE NEGATIVE  12/31/2018 1014   LEUKOCYTESUR NEGATIVE 12/31/2018 1014   Sepsis Labs: @LABRCNTIP (procalcitonin:4,lacticidven:4)  ) Recent Results (from the past 240 hour(s))  SARS Coronavirus 2 (CEPHEID - Performed in [place] hospital lab), Hosp Order     Status: None   Collection Time: 12/30/18  6:19 AM   Specimen: Nasopharyngeal Swab  Result Value Ref Range Status   SARS Coronavirus 2 NEGATIVE NEGATIVE Final    Comment: (NOTE) If result is NEGATIVE SARS-CoV-2 target nucleic acids are NOT DETECTED. The SARS-CoV-2 RNA is generally detectable in upper and lower  respiratory specimens during the acute phase of infection. The lowest  concentration of SARS-CoV-2 viral copies this assay can detect is 250  copies / mL. A negative result does not preclude SARS-CoV-2 infection  and should not be used as the sole basis for treatment or other  patient management decisions.  A negative result may occur with  improper specimen collection / handling, submission of specimen other  than nasopharyngeal swab, presence of viral mutation(s) within the  areas targeted by this assay, and inadequate number of viral copies  (<250 copies / mL). A negative result must be combined with clinical  observations, patient history, and epidemiological information. If result is POSITIVE SARS-CoV-2 target nucleic acids are DETECTED. The SARS-CoV-2 RNA is generally detectable in upper and lower  respiratory specimens dur ing the acute phase of infection.  Positive  results are indicative of active infection with SARS-CoV-2.  Clinical  correlation with patient history and other diagnostic information is  necessary to determine patient infection status.  Positive results do  not rule out bacterial infection or co-infection with other viruses. If result is PRESUMPTIVE POSTIVE SARS-CoV-2 nucleic acids MAY BE PRESENT.   A presumptive positive result was obtained on the submitted specimen  and confirmed on repeat testing.  While  2019 novel coronavirus  (SARS-CoV-2) nucleic acids may be present in the submitted sample  additional confirmatory testing may be necessary for epidemiological  and / or clinical management purposes  to differentiate between  SARS-CoV-2 and other Sarbecovirus currently known to infect humans.  If clinically indicated additional testing with an alternate test  methodology [phone]) is advised. The SARS-CoV-2 RNA is generally  detectable in upper and lower respiratory sp ecimens during the acute  phase of infection. The expected result is Negative. Fact Sheet for Patients:  StrictlyIdeas.no Fact Sheet for Healthcare Providers: BankingDealers.co.za This test is not yet approved or cleared by the [place] FDA and has been authorized for detection and/or diagnosis of SARS-CoV-2 by FDA under an Emergency Use Authorization (EUA).  This EUA will remain in effect (meaning this test can be used) for the duration of the COVID-19 declaration under Section 564(b)(1) of the Act, 21 U.S.C. section 360bbb-3(b)(1), unless the authorization is terminated or revoked sooner. Performed at [facility], [place] [address], [place], [place] [address]   MRSA PCR Screening     Status: None   Collection Time: 12/30/18 12:15 PM   Specimen: Nasal Mucosa; Nasopharyngeal  Result Value Ref Range Status   MRSA by PCR NEGATIVE NEGATIVE Final    Comment:        The GeneXpert MRSA Assay (FDA approved for NASAL specimens only), is one component of a comprehensive MRSA colonization surveillance program. It is not intended to diagnose MRSA infection nor to guide or monitor treatment for MRSA infections. Performed at [facility], [place] [address], [place], [place] [address]       Studies: No results found.  Scheduled Meds: . amLODipine  10 mg Oral Daily  . atorvastatin  40 mg Oral q1800  . Chlorhexidine Gluconate Cloth  6 each Topical Q0600  .  [START ON 01/01/2019] doxercalciferol  2 mcg Oral Once per day on Tue Thu Sat  . heparin  5,000 Units Subcutaneous Q8H  . hydrALAZINE  25 mg Oral Q8H  . pantoprazole  40 mg Oral Daily  . sodium chloride flush  3 mL Intravenous Once    Continuous Infusions:   LOS: 1 day     Kayleen Memos, MD [facility] Pager [phone]  If 7PM-7AM, please contact night-coverage [email or web address] Password [email or web address] 12/31/2018, 3:10 PM

## 2019-01-01 LAB — COMPREHENSIVE METABOLIC PANEL
ALT: 20 U/L (ref 0–44)
AST: 25 U/L (ref 15–41)
Albumin: 3.6 g/dL (ref 3.5–5.0)
Alkaline Phosphatase: 77 U/L (ref 38–126)
Anion gap: 16 — ABNORMAL HIGH (ref 5–15)
BUN: 13 mg/dL (ref 8–23)
CO2: 25 mmol/L (ref 22–32)
Calcium: 9 mg/dL (ref 8.9–10.3)
Chloride: 96 mmol/L — ABNORMAL LOW (ref 98–111)
Creatinine, Ser: 5.47 mg/dL — ABNORMAL HIGH (ref 0.44–1.00)
GFR calc Af Amer: 8 mL/min — ABNORMAL LOW (ref >60)
GFR calc non Af Amer: 7 mL/min — ABNORMAL LOW (ref >60)
Glucose, Bld: 82 mg/dL (ref 70–99)
Potassium: 4 mmol/L (ref 3.5–5.1)
Sodium: 137 mmol/L (ref 135–145)
Total Bilirubin: 0.8 mg/dL (ref 0.3–1.2)
Total Protein: 7.7 g/dL (ref 6.5–8.1)

## 2019-01-01 LAB — CBC
HCT: 35.7 % — ABNORMAL LOW (ref 36.0–46.0)
Hemoglobin: 11.7 g/dL — ABNORMAL LOW (ref 12.0–15.0)
MCH: 30.6 pg (ref 26.0–34.0)
MCHC: 32.8 g/dL (ref 30.0–36.0)
MCV: 93.5 fL (ref 80.0–100.0)
Platelets: 334 10*3/uL (ref 150–400)
RBC: 3.82 MIL/uL — ABNORMAL LOW (ref 3.87–5.11)
RDW: 13.8 % (ref 11.5–15.5)
WBC: 5.6 10*3/uL (ref 4.0–10.5)
nRBC: 0 % (ref 0.0–0.2)

## 2019-01-01 LAB — RENAL FUNCTION PANEL
Albumin: 3.4 g/dL — ABNORMAL LOW (ref 3.5–5.0)
Anion gap: 15 (ref 5–15)
BUN: 29 mg/dL — ABNORMAL HIGH (ref 8–23)
CO2: 23 mmol/L (ref 22–32)
Calcium: 9.2 mg/dL (ref 8.9–10.3)
Chloride: 94 mmol/L — ABNORMAL LOW (ref 98–111)
Creatinine, Ser: 8.52 mg/dL — ABNORMAL HIGH (ref 0.44–1.00)
GFR calc Af Amer: 5 mL/min — ABNORMAL LOW (ref >60)
GFR calc non Af Amer: 4 mL/min — ABNORMAL LOW (ref >60)
Glucose, Bld: 168 mg/dL — ABNORMAL HIGH (ref 70–99)
Phosphorus: 6.3 mg/dL — ABNORMAL HIGH (ref 2.5–4.6)
Potassium: 3.9 mmol/L (ref 3.5–5.1)
Sodium: 132 mmol/L — ABNORMAL LOW (ref 135–145)

## 2019-01-01 MED ORDER — LABETALOL HCL 5 MG/ML IV SOLN: 10.0000 mg | Freq: Four times a day (QID) | INTRAVENOUS | Status: DC | PRN

## 2019-01-01 NOTE — Plan of Care (Signed)
  Problem: Nutrition: Goal: Adequate nutrition will be maintained Outcome: Progressing   Problem: Coping: Goal: Level of anxiety will decrease Outcome: Progressing   

## 2019-01-01 NOTE — Progress Notes (Signed)
PROGRESS NOTE  Ashley [name] VPX:[id number] DOB: 1951/03/27 DOA: 12/29/2018 PCP: Ladell Pier, MD  HPI/Recap of past 24 hours: Ashley [name] is a 68 y.o. female with medical history significant of prior pancreatitis, ESRD on HD TTS, HTN, who presented with significant epigastric abdominal pain of 2 days duration and was found to have elevated lipase.  Physical exam is suggestive of acute pancreatitis.  CT abdomen and pelvis without contrast done on admission is pending.  Denies vomiting or diarrhea.  Denies alcohol use.  She likes to eat greasy food.  Symptoms feel identical to prior pancreatitis.  She states she still makes urine about 2 times a day.  Patient had mild acute pancreatitis in May of this year, uncomplicated and ultimately idiopathic as work up didn't reveal an obvious cause at that time.  Evidence of acute pancreatitis on CT abdomen and pelvis with elevated lipase level.  Admitted for acute recurrent pancreatitis.  01/01/19: Patient was seen and examined at her bedside at HD center.  Patient reports chest pain intermittent occurring 30-40 mins into dialysis session for the past 4 weeks.EKG ordered as need when having chest pain.  Denies nausea and abdominal pain is improving.    Assessment/Plan: Principal Problem:   Acute pancreatitis Active Problems:   Peptic ulcer disease   Hypertension   ESRD (end stage renal disease) on dialysis [facility])   History of cardioembolic cerebrovascular accident (CVA)   Lesion of left native kidney   Acute recurrent pancreatitis Presented with elevated lipase level, epigastric abdominal pain and evidence of pancreatitis on CT abdomen and pelvis No pseudocyst on CT scan States she has had pancreatitis x3 times Continue p.o. oxycodone as needed for moderate pain Continue IV Dilaudid as needed for severe pain She asked about a diet today Soft Diet as tolerated Lipase 134>>63  ESRD on HD Tuesday Thursday Saturday Last hemodialysis  12/30/2018 HD Today 01/01/2019 Nephrology following  Intermittent chest discomfort, unclear etiology States discomfort 30-40 minutes into HD for the last 4 weeks Twelve-lead EKG PRN when having chest discomfort  Essential hypertension Blood pressure not at goal post HD C/w amlodipine 10 mg daily and p.o. hydralazine 25 mg 3 times daily C/w  monitor vital signs IV labetalol 10 mg every 6 hours PRN for systolic blood pressure greater than 170  Hyperlipidemia Continue Lipitor 40 mg daily  History of peptic ulcer disease Continue PPI, Protonix 40 mg daily  History of left kidney nodule 10 mm in May 2020  History of hepatitis C Follow-up with GI      DVT prophylaxis: Heparin [place] 3 times daily Code Status: Full Family Communication: No family in room Disposition Plan: Home after admit Consults called: None, call nephro in AM      Objective: Vitals:   01/01/19 1030 01/01/19 1100 01/01/19 1130 01/01/19 1141  BP: (!) 162/99 (!) 163/96 (!) 164/84 (!) 151/92  Pulse: (!) 107 (!) 103 100 (!) 101  Resp: 16 18 19 18   Temp:    97.9 F (36.6 C)  TempSrc:    Oral  SpO2:    99%  Weight:    67.8 kg  Height:        Intake/Output Summary (Last 24 hours) at 01/01/2019 1258 Last data filed at 01/01/2019 1141 Gross per 24 hour  Intake 680 ml  Output 500 ml  Net 180 ml   Filed Weights   12/31/18 2003 01/01/19 0800 01/01/19 1141  Weight: 66.1 kg 68.1 kg 67.8 kg  Exam:  . General: 68 y.o. year-old female well-developed well-nourished in no acute distress.  Alert and oriented x3.   . Cardiovascular: Regular rate and rhythm no rubs or gallops.  No JVD or thyromegaly noted.   Marland Kitchen Respiratory: Clear to auscultation no wheezes or rales.  Good inspiratory effort.  . Abdomen: Soft mildly tender epigastric with normal bowel sounds present. . Musculoskeletal: No lower extremity edema.  2 out of 4 pulses in all 4 extremities . Psychiatry: Mood is appropriate for condition and setting    Data Reviewed: CBC: Recent Labs  Lab 12/29/18 2258 12/31/18 0607 01/01/19 0810  WBC 8.4 6.3 5.6  HGB 11.9* 12.5 11.7*  HCT 36.2 38.2 35.7*  MCV 94.5 94.3 93.5  PLT 309 310 510   Basic Metabolic Panel: Recent Labs  Lab 12/29/18 2258 12/31/18 0607 01/01/19 0810  NA 138 137 132*  K 4.9 4.0 3.9  CL 97* 96* 94*  CO2 26 25 23   GLUCOSE 91 82 168*  BUN 45* 13 29*  CREATININE 9.00* 5.47* 8.52*  CALCIUM 9.7 9.0 9.2  PHOS  --   --  6.3*   GFR: Estimated Creatinine Clearance: 5.6 mL/min (A) (by C-G formula based on SCr of 8.52 mg/dL (H)). Liver Function Tests: Recent Labs  Lab 12/29/18 2258 12/31/18 0607 01/01/19 0810  AST 23 25  --   ALT 22 20  --   ALKPHOS 79 77  --   BILITOT 0.4 0.8  --   PROT 7.5 7.7  --   ALBUMIN 3.6 3.6 3.4*   Recent Labs  Lab 12/29/18 2258 12/31/18 0607  LIPASE 134* 63*   No results for input(s): AMMONIA in the last 168 hours. Coagulation Profile: No results for input(s): INR, PROTIME in the last 168 hours. Cardiac Enzymes: No results for input(s): CKTOTAL, CKMB, CKMBINDEX, TROPONINI in the last 168 hours. BNP (last 3 results) No results for input(s): PROBNP in the last 8760 hours. HbA1C: No results for input(s): HGBA1C in the last 72 hours. CBG: No results for input(s): GLUCAP in the last 168 hours. Lipid Profile: No results for input(s): CHOL, HDL, LDLCALC, TRIG, CHOLHDL, LDLDIRECT in the last 72 hours. Thyroid Function Tests: No results for input(s): TSH, T4TOTAL, FREET4, T3FREE, THYROIDAB in the last 72 hours. Anemia Panel: No results for input(s): VITAMINB12, FOLATE, FERRITIN, TIBC, IRON, RETICCTPCT in the last 72 hours. Urine analysis:    Component Value Date/Time   COLORURINE YELLOW 12/31/2018 1014   APPEARANCEUR CLEAR 12/31/2018 1014   LABSPEC 1.008 12/31/2018 1014   PHURINE 9.0 (H) 12/31/2018 1014   GLUCOSEU NEGATIVE 12/31/2018 1014   HGBUR SMALL (A) 12/31/2018 1014   BILIRUBINUR NEGATIVE 12/31/2018 1014    BILIRUBINUR NEG 01/29/2016 1142   KETONESUR NEGATIVE 12/31/2018 1014   PROTEINUR >=300 (A) 12/31/2018 1014   UROBILINOGEN 0.2 01/29/2016 1142   UROBILINOGEN 0.2 03/21/2015 1649   NITRITE NEGATIVE 12/31/2018 1014   LEUKOCYTESUR NEGATIVE 12/31/2018 1014   Sepsis Labs: @LABRCNTIP (procalcitonin:4,lacticidven:4)  ) Recent Results (from the past 240 hour(s))  SARS Coronavirus 2 (CEPHEID - Performed in [place] hospital lab), Hosp Order     Status: None   Collection Time: 12/30/18  6:19 AM   Specimen: Nasopharyngeal Swab  Result Value Ref Range Status   SARS Coronavirus 2 NEGATIVE NEGATIVE Final    Comment: (NOTE) If result is NEGATIVE SARS-CoV-2 target nucleic acids are NOT DETECTED. The SARS-CoV-2 RNA is generally detectable in upper and lower  respiratory specimens during the acute phase of infection. The  lowest  concentration of SARS-CoV-2 viral copies this assay can detect is 250  copies / mL. A negative result does not preclude SARS-CoV-2 infection  and should not be used as the sole basis for treatment or other  patient management decisions.  A negative result may occur with  improper specimen collection / handling, submission of specimen other  than nasopharyngeal swab, presence of viral mutation(s) within the  areas targeted by this assay, and inadequate number of viral copies  (<250 copies / mL). A negative result must be combined with clinical  observations, patient history, and epidemiological information. If result is POSITIVE SARS-CoV-2 target nucleic acids are DETECTED. The SARS-CoV-2 RNA is generally detectable in upper and lower  respiratory specimens dur ing the acute phase of infection.  Positive  results are indicative of active infection with SARS-CoV-2.  Clinical  correlation with patient history and other diagnostic information is  necessary to determine patient infection status.  Positive results do  not rule out bacterial infection or co-infection with  other viruses. If result is PRESUMPTIVE POSTIVE SARS-CoV-2 nucleic acids MAY BE PRESENT.   A presumptive positive result was obtained on the submitted specimen  and confirmed on repeat testing.  While 2019 novel coronavirus  (SARS-CoV-2) nucleic acids may be present in the submitted sample  additional confirmatory testing may be necessary for epidemiological  and / or clinical management purposes  to differentiate between  SARS-CoV-2 and other Sarbecovirus currently known to infect humans.  If clinically indicated additional testing with an alternate test  methodology [phone]) is advised. The SARS-CoV-2 RNA is generally  detectable in upper and lower respiratory sp ecimens during the acute  phase of infection. The expected result is Negative. Fact Sheet for Patients:  StrictlyIdeas.no Fact Sheet for Healthcare Providers: BankingDealers.co.za This test is not yet approved or cleared by the [place] FDA and has been authorized for detection and/or diagnosis of SARS-CoV-2 by FDA under an Emergency Use Authorization (EUA).  This EUA will remain in effect (meaning this test can be used) for the duration of the COVID-19 declaration under Section 564(b)(1) of the Act, 21 U.S.C. section 360bbb-3(b)(1), unless the authorization is terminated or revoked sooner. Performed at [facility], [place] [address], [place], [place] [address]   MRSA PCR Screening     Status: None   Collection Time: 12/30/18 12:15 PM   Specimen: Nasal Mucosa; Nasopharyngeal  Result Value Ref Range Status   MRSA by PCR NEGATIVE NEGATIVE Final    Comment:        The GeneXpert MRSA Assay (FDA approved for NASAL specimens only), is one component of a comprehensive MRSA colonization surveillance program. It is not intended to diagnose MRSA infection nor to guide or monitor treatment for MRSA infections. Performed at [facility], [place] [address],  [place], [place] [address]       Studies: No results found.  Scheduled Meds: . amLODipine  10 mg Oral Daily  . atorvastatin  40 mg Oral q1800  . Chlorhexidine Gluconate Cloth  6 each Topical Q0600  . doxercalciferol  2 mcg Oral Once per day on Tue Thu Sat  . heparin  5,000 Units Subcutaneous Q8H  . hydrALAZINE  25 mg Oral Q8H  . pantoprazole  40 mg Oral Daily  . sodium chloride flush  3 mL Intravenous Once    Continuous Infusions:   LOS: 2 days     Kayleen Memos, MD [facility] Pager [phone]  If 7PM-7AM, please  contact night-coverage [email or web address] Password [facility] 01/01/2019, 12:58 PM

## 2019-01-01 NOTE — Progress Notes (Signed)
   [facility] Progress Note   Subjective:  Seen on HD - tolerating. No UF today since hasn't really eaten in 2 days. She is hoping will be able to eat later today, feels hungry. No CP/dyspnea.  Objective Vitals:   01/01/19 0830 01/01/19 0900 01/01/19 0930 01/01/19 1000  BP: (!) 151/90 (!) 159/90 (!) 146/97 (!) 163/97  Pulse: 92 93 97 96  Resp: 16 18 18 18   Temp:      TempSrc:      SpO2:      Weight:      Height:       Physical Exam General: Frail woman, NAD. Heart: RRR; 2/6 systolic murmur Lungs: CTAB Abdomen: soft, mild generalized tenderness Extremities: No LE edema Dialysis Access: LUE AVF + thrill  Additional Objective Labs: Basic Metabolic Panel: Recent Labs  Lab 12/29/18 2258 12/31/18 0607 01/01/19 0810  NA 138 137 132*  K 4.9 4.0 3.9  CL 97* 96* 94*  CO2 26 25 23   GLUCOSE 91 82 168*  BUN 45* 13 29*  CREATININE 9.00* 5.47* 8.52*  CALCIUM 9.7 9.0 9.2  PHOS  --   --  6.3*   Liver Function Tests: Recent Labs  Lab 12/29/18 2258 12/31/18 0607 01/01/19 0810  AST 23 25  --   ALT 22 20  --   ALKPHOS 79 77  --   BILITOT 0.4 0.8  --   PROT 7.5 7.7  --   ALBUMIN 3.6 3.6 3.4*   Recent Labs  Lab 12/29/18 2258 12/31/18 0607  LIPASE 134* 63*   CBC: Recent Labs  Lab 12/29/18 2258 12/31/18 0607 01/01/19 0810  WBC 8.4 6.3 5.6  HGB 11.9* 12.5 11.7*  HCT 36.2 38.2 35.7*  MCV 94.5 94.3 93.5  PLT 309 310 334   Medications:  . amLODipine  10 mg Oral Daily  . atorvastatin  40 mg Oral q1800  . Chlorhexidine Gluconate Cloth  6 each Topical Q0600  . doxercalciferol  2 mcg Oral Once per day on Tue Thu Sat  . heparin  5,000 Units Subcutaneous Q8H  . hydrALAZINE  25 mg Oral Q8H  . pantoprazole  40 mg Oral Daily  . sodium chloride flush  3 mL Intravenous Once    Dialysis Orders: TTS [facility] 3.5hr, 2K/2Ca, EDW 66.5kg, 350/800, LUE AVF, Profile 4, Hep 2000 bolus - Mircera 33mcg IV q 2 weeks (last 7/7) - Hectoral 96mcg IV q  HD  Assessment/Plan: 1. Pancreatitis: No pseudocyst per abd CT, + mild inflammation of head of pancreas c/w pancreatitis.  Afebrile. Pain control per primary. 2. ESRD: Continue HD per TTS schedule - HD today. 3. HTN/volume: BP slightly high, no edema - continue home meds. 4. Anemia: Hgb 11.7 - technically due for ESA, but holding for now since Hgb > 11. 5. Secondary hyperparathyroidism: Ca ok, Phos ^ high. Continue VDRA, resume binders when eating. 6. Hx CVA 7. Hx Hep C  Veneta Penton, PA-C 01/01/2019, 10:31 AM  [facility] Pager: [phone]

## 2019-01-02 DIAGNOSIS — B182 Chronic viral hepatitis C: Secondary | ICD-10-CM

## 2019-01-02 DIAGNOSIS — Z992 Dependence on renal dialysis: Secondary | ICD-10-CM

## 2019-01-02 DIAGNOSIS — N186 End stage renal disease: Secondary | ICD-10-CM

## 2019-01-02 LAB — LIPASE, BLOOD: Lipase: 42 U/L (ref 11–51)

## 2019-01-02 NOTE — Progress Notes (Addendum)
  [facility] Progress Note   Subjective:  Seen in room. Abd pain continues to improve. Tried to eat meatloaf and rice last night, says was not appetizing at all. Looks like back on clears this morning. No dyspnea.  Objective Vitals:   01/01/19 1719 01/01/19 2119 01/02/19 0558 01/02/19 0854  BP: (!) 131/59 (!) 117/55 124/68 (!) 142/71  Pulse: 91 84 83 86  Resp: 18 12 16 18   Temp: 98.2 F (36.8 C) 98.6 F (37 C) 98.4 F (36.9 C) 98 F (36.7 C)  TempSrc: Oral Oral Oral Oral  SpO2: 98% 96% 95% 96%  Weight:   64.9 kg   Height:       Physical Exam General:Frail woman, NAD. Heart:RRR; 2/6 systolic murmur Lungs:CTAB Abdomen:soft, mild generalized tenderness Extremities:No LE edema Dialysis Access:LUE AVF + thrill  Additional Objective Labs: Basic Metabolic Panel: Recent Labs  Lab 12/29/18 2258 12/31/18 0607 01/01/19 0810  NA 138 137 132*  K 4.9 4.0 3.9  CL 97* 96* 94*  CO2 26 25 23   GLUCOSE 91 82 168*  BUN 45* 13 29*  CREATININE 9.00* 5.47* 8.52*  CALCIUM 9.7 9.0 9.2  PHOS  --   --  6.3*   Liver Function Tests: Recent Labs  Lab 12/29/18 2258 12/31/18 0607 01/01/19 0810  AST 23 25  --   ALT 22 20  --   ALKPHOS 79 77  --   BILITOT 0.4 0.8  --   PROT 7.5 7.7  --   ALBUMIN 3.6 3.6 3.4*   Recent Labs  Lab 12/29/18 2258 12/31/18 0607 01/02/19 0540  LIPASE 134* 63* 42   CBC: Recent Labs  Lab 12/29/18 2258 12/31/18 0607 01/01/19 0810  WBC 8.4 6.3 5.6  HGB 11.9* 12.5 11.7*  HCT 36.2 38.2 35.7*  MCV 94.5 94.3 93.5  PLT 309 310 334   Medications:  . amLODipine  10 mg Oral Daily  . atorvastatin  40 mg Oral q1800  . Chlorhexidine Gluconate Cloth  6 each Topical Q0600  . doxercalciferol  2 mcg Oral Once per day on Tue Thu Sat  . heparin  5,000 Units Subcutaneous Q8H  . hydrALAZINE  25 mg Oral Q8H  . pantoprazole  40 mg Oral Daily  . sodium chloride flush  3 mL Intravenous Once    Dialysis Orders: TTS [facility] 3.5hr, 2K/2Ca, EDW  66.5kg, 350/800, LUE AVF, Profile 4, Hep 2000 bolus - Mircera 83mcg IV q 2 weeks (last 7/7) - Hectoral 1mcg IV q HD  Assessment/Plan: 1.Pancreatitis: No pseudocyst per abd CT, + mild inflammation of head of pancreas c/w pancreatitis.Afebrile. Lipase improving. 2. ESRD:Continue HD per TTS schedule - next 7/28 if still inpatient. 3.HTN/volume:BP slightly high, no edema - continue home meds. 4. Anemia:Hgb 11.7 - technically due for ESA, but holding for now since Hgb > 11. 5. Secondary hyperparathyroidism:Ca ok, Phos ^ high. Continue VDRA, resume binders when eating. 6.Hx CVA 7. Hx Hep C  Veneta Penton, PA-C 01/02/2019, 9:01 AM  [facility] Pager: [phone]  Pt seen, examined and agree w A/P as above.  Kelly Splinter  MD 01/02/2019, 2:32 PM

## 2019-01-02 NOTE — Progress Notes (Signed)
PROGRESS NOTE  [name] JSE:[id number] DOB: Jan 05, 1951 DOA: 12/29/2018 PCP: Ladell Pier, MD  HPI/Recap of past 24 hours: Ashley [name] is a 68 y.o. female with medical history significant of prior pancreatitis, ESRD on HD TTS, HTN, who presented with significant epigastric abdominal pain of 2 days duration and was found to have elevated lipase.  Physical exam is suggestive of acute pancreatitis.  CT abdomen and pelvis without contrast done on admission is pending.  Denies vomiting or diarrhea.  Denies alcohol use.  She likes to eat greasy food.  Symptoms feel identical to prior pancreatitis.  She states she still makes urine about 2 times a day.  Patient had mild acute pancreatitis in May of this year, uncomplicated and ultimately idiopathic as work up didn't reveal an obvious cause at that time.  Evidence of acute pancreatitis on CT abdomen and pelvis with elevated lipase level.  Admitted for acute recurrent pancreatitis.   01/02/19: Patient was seen and examined at her bedside this morning.  She reports persistent nausea and improved but persistent epigastric pain.  Denies chest pain or dyspnea at rest.      Assessment/Plan: Principal Problem:   Acute pancreatitis Active Problems:   Peptic ulcer disease   Hypertension   ESRD (end stage renal disease) on dialysis [facility])   History of cardioembolic cerebrovascular accident (CVA)   Lesion of left native kidney   Acute recurrent pancreatitis Presented with elevated lipase level, epigastric abdominal pain and evidence of pancreatitis on CT abdomen and pelvis No pseudocyst on CT scan States she has had pancreatitis x3 times Continue p.o. oxycodone as needed for moderate pain Continue IV Dilaudid as needed for severe pain Advance diet as tolerated Lipase 134>>63>> 42 Persistent nausea Continue antiemetics GI consulted  Untreated hepatitis C  Follow-up with GI or ID clinic outpatient LFTs are stable No coagulopathy   ESRD on HD Tuesday Thursday Saturday Last hemodialysis 12/30/2018 HD Today 01/01/2019 Nephrology following  Intermittent chest discomfort, unclear etiology States discomfort 30-40 minutes into HD for the last 4 weeks Twelve-lead EKG PRN when having chest discomfort  Essential hypertension Blood pressure not at goal post HD C/w amlodipine 10 mg daily and p.o. hydralazine 25 mg 3 times daily C/w  monitor vital signs IV labetalol 10 mg every 6 hours PRN for systolic blood pressure greater than 170  Hyperlipidemia Continue Lipitor 40 mg daily  History of peptic ulcer disease Continue PPI, Protonix 40 mg daily  History of left kidney nodule 10 mm in May 2020  History of hepatitis C Follow-up with GI      DVT prophylaxis: Heparin [place] 3 times daily Code Status: Full Family Communication: No family in room Disposition Plan:  Home possibly tomorrow 01/03/19 or when GI signs off. Consults called:  Nephro, GI.      Objective: Vitals:   01/01/19 1719 01/01/19 2119 01/02/19 0558 01/02/19 0854  BP: (!) 131/59 (!) 117/55 124/68 (!) 142/71  Pulse: 91 84 83 86  Resp: 18 12 16 18   Temp: 98.2 F (36.8 C) 98.6 F (37 C) 98.4 F (36.9 C) 98 F (36.7 C)  TempSrc: Oral Oral Oral Oral  SpO2: 98% 96% 95% 96%  Weight:   64.9 kg   Height:        Intake/Output Summary (Last 24 hours) at 01/02/2019 1101 Last data filed at 01/02/2019 0912 Gross per 24 hour  Intake 1080 ml  Output 700 ml  Net 380 ml   [employer]  01/01/19 0800 01/01/19 1141 01/02/19 0558  Weight: 68.1 kg 67.8 kg 64.9 kg    Exam:  . General: 68 y.o. year-old female well-developed well-nourished.  Alert and oriented x3.   . Cardiovascular: Regular rate and rhythm.  No rubs or gallops.  No JVD or thyromegaly. Marland Kitchen Respiratory: Clear to Auscultation No Wheezes or Rales.  Good Inspiratory Effort.  . Abdomen: Soft mildly tender epigastric region.  Bowel sounds present. . Musculoskeletal: No lower extremity edema.   Peripheral pulses in 4 extremities. Marland Kitchen Psychiatry: Mood is appropriate   Data Reviewed: CBC: Recent Labs  Lab 12/29/18 2258 12/31/18 0607 01/01/19 0810  WBC 8.4 6.3 5.6  HGB 11.9* 12.5 11.7*  HCT 36.2 38.2 35.7*  MCV 94.5 94.3 93.5  PLT 309 310 818   Basic Metabolic Panel: Recent Labs  Lab 12/29/18 2258 12/31/18 0607 01/01/19 0810  NA 138 137 132*  K 4.9 4.0 3.9  CL 97* 96* 94*  CO2 26 25 23   GLUCOSE 91 82 168*  BUN 45* 13 29*  CREATININE 9.00* 5.47* 8.52*  CALCIUM 9.7 9.0 9.2  PHOS  --   --  6.3*   GFR: Estimated Creatinine Clearance: 5.5 mL/min (A) (by C-G formula based on SCr of 8.52 mg/dL (H)). Liver Function Tests: Recent Labs  Lab 12/29/18 2258 12/31/18 0607 01/01/19 0810  AST 23 25  --   ALT 22 20  --   ALKPHOS 79 77  --   BILITOT 0.4 0.8  --   PROT 7.5 7.7  --   ALBUMIN 3.6 3.6 3.4*   Recent Labs  Lab 12/29/18 2258 12/31/18 0607 01/02/19 0540  LIPASE 134* 63* 42   No results for input(s): AMMONIA in the last 168 hours. Coagulation Profile: No results for input(s): INR, PROTIME in the last 168 hours. Cardiac Enzymes: No results for input(s): CKTOTAL, CKMB, CKMBINDEX, TROPONINI in the last 168 hours. BNP (last 3 results) No results for input(s): PROBNP in the last 8760 hours. HbA1C: No results for input(s): HGBA1C in the last 72 hours. CBG: No results for input(s): GLUCAP in the last 168 hours. Lipid Profile: No results for input(s): CHOL, HDL, LDLCALC, TRIG, CHOLHDL, LDLDIRECT in the last 72 hours. Thyroid Function Tests: No results for input(s): TSH, T4TOTAL, FREET4, T3FREE, THYROIDAB in the last 72 hours. Anemia Panel: No results for input(s): VITAMINB12, FOLATE, FERRITIN, TIBC, IRON, RETICCTPCT in the last 72 hours. Urine analysis:    Component Value Date/Time   COLORURINE YELLOW 12/31/2018 1014   APPEARANCEUR CLEAR 12/31/2018 1014   LABSPEC 1.008 12/31/2018 1014   PHURINE 9.0 (H) 12/31/2018 1014   GLUCOSEU NEGATIVE 12/31/2018  1014   HGBUR SMALL (A) 12/31/2018 1014   BILIRUBINUR NEGATIVE 12/31/2018 1014   BILIRUBINUR NEG 01/29/2016 1142   KETONESUR NEGATIVE 12/31/2018 1014   PROTEINUR >=300 (A) 12/31/2018 1014   UROBILINOGEN 0.2 01/29/2016 1142   UROBILINOGEN 0.2 03/21/2015 1649   NITRITE NEGATIVE 12/31/2018 1014   LEUKOCYTESUR NEGATIVE 12/31/2018 1014   Sepsis Labs: @LABRCNTIP (procalcitonin:4,lacticidven:4)  ) Recent Results (from the past 240 hour(s))  SARS Coronavirus 2 (CEPHEID - Performed in [place] hospital lab), Hosp Order     Status: None   Collection Time: 12/30/18  6:19 AM   Specimen: Nasopharyngeal Swab  Result Value Ref Range Status   SARS Coronavirus 2 NEGATIVE NEGATIVE Final    Comment: (NOTE) If result is NEGATIVE SARS-CoV-2 target nucleic acids are NOT DETECTED. The SARS-CoV-2 RNA is generally detectable in upper and lower  respiratory specimens during  the acute phase of infection. The lowest  concentration of SARS-CoV-2 viral copies this assay can detect is 250  copies / mL. A negative result does not preclude SARS-CoV-2 infection  and should not be used as the sole basis for treatment or other  patient management decisions.  A negative result may occur with  improper specimen collection / handling, submission of specimen other  than nasopharyngeal swab, presence of viral mutation(s) within the  areas targeted by this assay, and inadequate number of viral copies  (<250 copies / mL). A negative result must be combined with clinical  observations, patient history, and epidemiological information. If result is POSITIVE SARS-CoV-2 target nucleic acids are DETECTED. The SARS-CoV-2 RNA is generally detectable in upper and lower  respiratory specimens dur ing the acute phase of infection.  Positive  results are indicative of active infection with SARS-CoV-2.  Clinical  correlation with patient history and other diagnostic information is  necessary to determine patient infection  status.  Positive results do  not rule out bacterial infection or co-infection with other viruses. If result is PRESUMPTIVE POSTIVE SARS-CoV-2 nucleic acids MAY BE PRESENT.   A presumptive positive result was obtained on the submitted specimen  and confirmed on repeat testing.  While 2019 novel coronavirus  (SARS-CoV-2) nucleic acids may be present in the submitted sample  additional confirmatory testing may be necessary for epidemiological  and / or clinical management purposes  to differentiate between  SARS-CoV-2 and other Sarbecovirus currently known to infect humans.  If clinically indicated additional testing with an alternate test  methodology [phone]) is advised. The SARS-CoV-2 RNA is generally  detectable in upper and lower respiratory sp ecimens during the acute  phase of infection. The expected result is Negative. Fact Sheet for Patients:  StrictlyIdeas.no Fact Sheet for Healthcare Providers: BankingDealers.co.za This test is not yet approved or cleared by the [place] FDA and has been authorized for detection and/or diagnosis of SARS-CoV-2 by FDA under an Emergency Use Authorization (EUA).  This EUA will remain in effect (meaning this test can be used) for the duration of the COVID-19 declaration under Section 564(b)(1) of the Act, 21 U.S.C. section 360bbb-3(b)(1), unless the authorization is terminated or revoked sooner. Performed at [facility], [place] [address] [place], [place] [address]   MRSA PCR Screening     Status: None   Collection Time: 12/30/18 12:15 PM   Specimen: Nasal Mucosa; Nasopharyngeal  Result Value Ref Range Status   MRSA by PCR NEGATIVE NEGATIVE Final    Comment:        The GeneXpert MRSA Assay (FDA approved for NASAL specimens only), is one component of a comprehensive MRSA colonization surveillance program. It is not intended to diagnose MRSA infection nor to guide or monitor  treatment for MRSA infections. Performed at [facility], [place] [address] [place], [place] [address]       Studies: No results found.  Scheduled Meds: . amLODipine  10 mg Oral Daily  . atorvastatin  40 mg Oral q1800  . Chlorhexidine Gluconate Cloth  6 each Topical Q0600  . doxercalciferol  2 mcg Oral Once per day on Tue Thu Sat  . heparin  5,000 Units Subcutaneous Q8H  . hydrALAZINE  25 mg Oral Q8H  . pantoprazole  40 mg Oral Daily  . sodium chloride flush  3 mL Intravenous Once    Continuous Infusions:   LOS: 3 days     Kayleen Memos, MD [facility]  Pager [phone]  If 7PM-7AM, please contact night-coverage [email or web address] Password [email or web address] 01/02/2019, 11:01 AM

## 2019-01-02 NOTE — Consult Note (Signed)
Referring Provider: Irene Pap Primary Care Physician:  Ladell Pier, MD Primary Gastroenterologist:  Dr. Carol Ada  Reason for Consultation: Nausea, abdominal pain, recurrent pancreatitis.   HPI: Ashley [name] is a 68 y.o. female with a past medical history significant for pancreatitis, GERD, perforated gastric ulcer with surgical repair 1990, Hepatitis C (never treated), IVDA,  ESRD on HD, HTN who presented to the ED with epigastric pain for 2 days. She was concerned she was having another episode of pancreatitis so she presented to [facility] ED for further evaluation.   In review of her records, she was admitted admitted to the hospital 10/14/2018 with abdominal pain, she was diagnosed with pancreatitis. Lipase 179 at that time and an abdominal/pelvic CT showed Mild edema surrounding the body and tail of pancreas compatible with acute pancreatitis in the appropriate clinical setting. No evidence for pancreatic necrosis or acute peripancreatic collection. A RUQ sonogram did not show any gallstones or gallbladder wall thickening. Normal liver. Patent portal vein.  ED Course 12/30/2018:  Na 138. K 4.9. Glu 91. BUN 45. Cr. 9.0. Alk phos 79. AST 23. ALT 22. Lipase 134. Lactic acid 1.0. WBC 8.4. Hg 11.9. HCT 36.2. SARS Coronoavirus 2 negative.  Abdominal/Pevlic CT WO contrast 8/67/6720: Normal liver. No gallstones. Pancreas with mild edema and stranding around the pancreatic head. Previously seen edema around the pancreatic body and tail has resolved. No ductal dilatation. Mild edema and stranding around the head of the pancreas, consistent with acute pancreatitis  Currently, she complains of having mild upper abdominal pain that is somewhat triggered by eating and is reduced after receiving Dilaudid. She had nausea and dry heaves last night, no vomiting. She passed a small dark loose stool yesterday, stools dark since taking iron. She takes Protonix 61m QD for GERD. No dysphagia or heartburn.  She denies alcohol use. No NSAIDs use. She complains of having left chest pain 30-60 minutes before her dialysis is completed. She does not have chest pain otherwise. She discussed her CP complaint with her nephrologist and she was advised to see a cardiologist which has not yet been done. No chest pain at this time. No SOB. No fever, sweat or chills. No weight loss. No family history of pancreatic cancer. She's never had a colonoscopy. She reported completing a Cologuard test 3 months ago which was negative. Chronic hepatitis C never treated, she is considering treatment in the near future. Her only new medication was Hydralazine, unlikely cause of pancreatitis.   05/29/2013 EGD by Dr. PHilarie Fredrickson 3 cm HH, non obstructing Schatzki's ring, mild antral gastropathy, small nonbleeding AVM duodenum, polyp duodenal bulb (benign reactive mucosa). No evidence of H. Pylori.  Past Medical History:  Diagnosis Date  . [place] ([place] 05/24[place]  . Acute pancreatitis 12/2018  . Arthritis   . Cervical radiculopathy 02/28/2011  . Chronic renal failure   . [place] 05/26/2013   positive UDS   . Gastropathy   . GERD (gastroesophageal reflux disease)   . Hepatitis C 1987   history of IVDA  . Hiatal hernia   . Hyperlipidemia   . Hypertension   . Marijuana abuse 12/18/204   positive UDS, family members smoke as well  . Pancreatitis 2000   resolved  . 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 Schatzki's ring   . [place]roke ([place]   . Ulcer 1990   gastric ulcer. Ruptured s/p emergency repair    Past Surgical History:  Procedure Laterality Date  .  ABDOMINAL HYSTERECTOMY  1979  . AV FISTULA PLACEMENT Left 06/16/2017   Procedure: ARTERIOVENOUS (AV) FISTULA CREATION LEFT ARM;  Surgeon: Conrad Burns, MD;  Location: [place];  Service: Vascular;  Laterality: Left;  . BASCILIC VEIN TRANSPOSITION Left 10/02/2017   Procedure: BASILIC VEIN TRANSPOSITION SECOND STAGE LEFT ARM;  Surgeon:  Rosetta Posner, MD;  Location: [facility] OR;  Service: Vascular;  Laterality: Left;  . ESOPHAGOGASTRODUODENOSCOPY N/A 05/29/2013   Procedure: ESOPHAGOGASTRODUODENOSCOPY (EGD);  Surgeon: Jerene Bears, MD;  [facility] END[facility]COPY;  Service: Endoscopy;  Laterality: N/A;  . EXCHANGE OF A DIALYSIS CATHETER Left 07/31/2017   Procedure: Removal  OF A  Right GroinTUNNELED  DIALYSIS CATHETER ,  Insertion of Left Femoral Dialysis Catheter.;  Surgeon: Rosetta Posner, MD;  Location: [place];  Service: Vascular;  Laterality: Left;  . INSERTION OF DIALYSIS CATHETER Right 06/16/2017   Procedure: INSERTION OF DIALYSIS CATHETER;  Surgeon: Conrad Brandt, MD;  Location: [place];  Service: Vascular;  Laterality: Right;  . IR AV DIALY SHUNT INTRO [place] W/PTA/IMG LEFT  06/21/2018  . REPAIR OF PERFORATED ULCER      Prior to Admission medications   Medication Sig Start Date End Date Taking? Authorizing Provider  amLODipine (NORVASC) 10 MG tablet Take 1 tablet (10 mg total) by mouth daily. 11/11/16  Yes Luiz Blare Y, DO  atorvastatin (LIPITOR) 40 MG tablet Take 1 tablet (40 mg total) by mouth daily at 6 PM. 04/04/18  Yes Ladell Pier, MD  hydrALAZINE (APRESOLINE) 25 MG tablet Take 1 tablet (25 mg total) by mouth every 8 (eight) hours. 07/02/17  Yes Rory Percy, DO  pantoprazole (PROTONIX) 40 MG tablet Take 1 tablet (40 mg total) by mouth daily. Patient not taking: Reported on 12/30/2018 07/02/17   Rory Percy, DO    Current Facility-Administered Medications  Medication Dose Route Frequency Provider Last Rate Last Dose  . acetaminophen (TYLENOL) tablet 650 mg  650 mg Oral Q6H PRN Etta Quill, DO       Or  . acetaminophen (TYLENOL) suppository 650 mg  650 mg Rectal Q6H PRN Etta Quill, DO      . amLODipine (NORVASC) tablet 10 mg  10 mg Oral Daily Jennette Kettle M, DO   10 mg at 01/02/19 5176  . atorvastatin (LIPITOR) tablet 40 mg  40 mg Oral q1800 Etta Quill, DO   40 mg at 01/01/19 1755   . Chlorhexidine Gluconate Cloth 2 % PADS 6 each  6 each Topical Q0600 Roney Jaffe, MD      . doxercalciferol (HECTOROL) capsule 2 mcg  2 mcg Oral Once per day on Tue Thu Sat Loren Racer, PA-C   2 mcg at 01/01/19 1409  . heparin injection 5,000 Units  5,000 Units Subcutaneous Q8H Jennette Kettle M, DO   5,000 Units at 01/02/19 0600  . hydrALAZINE (APRESOLINE) tablet 25 mg  25 mg Oral Q8H Gardner, Jared M, DO   25 mg at 01/02/19 0600  . HYDROmorphone (DILAUDID) injection 0.5 mg  0.5 mg Intravenous Q4H PRN Irene Pap N, DO   0.5 mg at 01/02/19 1102  . labetalol (NORMODYNE) injection 10 mg  10 mg Intravenous Q6H PRN Irene Pap N, DO      . ondansetron (ZOFRAN) tablet 4 mg  4 mg Oral Q6H PRN Etta Quill, DO       Or  . ondansetron [facility]) injection 4 mg  4 mg Intravenous Q6H PRN Etta Quill, DO      .  oxyCODONE (Oxy IR/ROXICODONE) immediate release tablet 5 mg  5 mg Oral Q3H PRN Irene Pap N, DO   5 mg at 01/01/19 2139  . pantoprazole (PROTONIX) EC tablet 40 mg  40 mg Oral Daily Jennette Kettle M, DO   40 mg at 01/02/19 5956  . sodium chloride flush (NS) 0.9 % injection 3 mL  3 mL Intravenous Once Cardama, Grayce Sessions, MD   Stopped at 12/30/18 0703    Allergies as of 12/29/2018 - Review Complete 12/29/2018  Allergen Reaction Noted  . Aspirin Nausea And Vomiting 07/18/2009  . Ibuprofen Nausea And Vomiting 12/24/2009    Family History  Problem Relation Age of Onset  . Hypertension Father   . Cancer Father   . Hyperlipidemia Father   . Seizures Sister   . Early death Daughter   . Kidney disease Daughter        end stage dialysis dependent     Social History   Socioeconomic History  . Marital status: Widowed    Spouse name: Not on file  . Number of children: Not on file  . Years of education: Not on file  . Highest education level: Not on file  Occupational History  . Not on file  Social Needs  . Financial resource strain: Not on file  . Food insecurity     Worry: Not on file    Inability: Not on file  . Transportation needs    Medical: Not on file    Non-medical: Not on file  Tobacco Use  . Smoking status: Former Smoker    Packs/day: 0.25    Years: 40.00    Pack years: 10.00    Types: Cigarettes  . Smokeless tobacco: Never Used  . Tobacco comment: wears a patch  Substance and Sexual Activity  . Alcohol use: No    Alcohol/week: 0.0 standard drinks  . Drug use: Yes    Types: Heroin, Marijuana, Cocaine    Comment: hasn't used in months  . Sexual activity: Never  Lifestyle  . Physical activity    Days per week: Not on file    Minutes per session: Not on file  . Stress: Not on file  Relationships  . Social Herbalist on phone: Not on file    Gets together: Not on file    Attends religious service: Not on file    Active member of club or organization: Not on file    Attends meetings of clubs or organizations: Not on file    Relationship status: Not on file  . Intimate partner violence    Fear of current or ex partner: Not on file    Emotionally abused: Not on file    Physically abused: Not on file    Forced sexual activity: Not on file  Other Topics Concern  . Not on file  Social History Narrative  . Not on file    Review of Systems:SeeHPI, all other systems reviewed and are negative   Physical Exam: Vital signs in last 24 hours: Temp:  [97.9 F (36.6 C)-98.6 F (37 C)] 98 F (36.7 C) (07/26 0854) Pulse Rate:  [83-103] 86 (07/26 0854) Resp:  [12-19] 18 (07/26 0854) BP: (117-164)/(55-92) 142/71 (07/26 0854) SpO2:  [95 %-99 %] 96 % (07/26 0854) Weight:  [64.9 kg-67.8 kg] 64.9 kg (07/26 0558) Last BM Date: 01/01/19 General:   Alert,  well-developed, well-nourished, pleasant and cooperative in NAD. Head:  Normocephalic and atraumatic. Eyes:  Sclera clear, no icterus.  Conjunctiva pink. Ears:  Normal auditory acuity. Nose:  No deformity, discharge or lesions. Mouth:  No deformity or lesions.   Neck:   Supple. Lungs:  Clear throughout. Heart:  RRR, no murmurs. Abdomen:  Soft, mild LUQ tenderness without rebound or guarding, no HSM. Rectal:  Deferred  Msk:  Symmetrical without gross deformities. . Pulses:  Normal pulses noted. Extremities:  Without clubbing or edema. LUE with fistula, + bruit and thrill. Neurologic:  Alert and  oriented x4;  grossly normal neurologically. Skin:  Intact without significant lesions or rashes.. Psych:  Alert and cooperative. Normal mood and affect.  Intake/Output from previous day: 07/25 0701 - 07/26 0700 In: 1080 [P.O.:1080] Out: 700 [Urine:200] Intake/Output this shift: Total I/O In: 240 [P.O.:240] Out: 0   Lab Results: Recent Labs    12/31/18 0607 01/01/19 0810  WBC 6.3 5.6  HGB 12.5 11.7*  HCT 38.2 35.7*  PLT 310 334   BMET Recent Labs    12/31/18 0607 01/01/19 0810  NA 137 132*  K 4.0 3.9  CL 96* 94*  CO2 25 23  GLUCOSE 82 168*  BUN 13 29*  CREATININE 5.47* 8.52*  CALCIUM 9.0 9.2   LFT Recent Labs    12/31/18 0607 01/01/19 0810  PROT 7.7  --   ALBUMIN 3.6 3.4*  AST 25  --   ALT 20  --   ALKPHOS 77  --   BILITOT 0.8  --    PT/INR No results for input(s): LABPROT, INR in the last 72 hours. Hepatitis Panel No results for input(s): HEPBSAG, HCVAB, HEPAIGM, HEPBIGM in the last 72 hours.    Studies/Results: No results found.  IMPRESSION/PLAN:  1. 68 y.o. female with recurrent epigastric pain diagnosed with pancreatitis per CT imaging. Lipase 134 >>63>>42. Minimal nausea/dry heaves last night. Tolerating a renal solid diet without significant abdominal pain. -consider EUS to further evaluate the pancreas and biliary tree, further recommendations per Dr. Benson Norway -request nephrology input for IV fluids in setting -will check IgG4, Triglyceride level in am -diet as tolerated -monitor abdominal pain -Zofran 61m po or IV Q 6hrs as needed   2. ESRD on HD. Last dialysis ws 7/25. CP during HD. Hg 12.5-11.7. -consider  cardiac evaluation while in hospital for c/o left chest pain that occurs only during dialysis  3. Chronic Hepatitis C, never treated. -follow up with Dr. HBenson Norwayas outpatient for Hep C RNA quant and Hep C tx  Further recommendations per Dr. PMaurine MinisterMDorathy Daft 01/02/2019, 11:13 AM

## 2019-01-02 NOTE — Plan of Care (Signed)
  Problem: Pain Managment: Goal: General experience of comfort will improve Outcome: Progressing   Problem: Coping: Goal: Level of anxiety will decrease Outcome: Progressing   Problem: Nutrition: Goal: Adequate nutrition will be maintained Outcome: Progressing   

## 2019-01-03 DIAGNOSIS — K8500 Idiopathic acute pancreatitis without necrosis or infection: Principal | ICD-10-CM

## 2019-01-03 LAB — CBC
HCT: 37.3 % (ref 36.0–46.0)
Hemoglobin: 12.6 g/dL (ref 12.0–15.0)
MCH: 31.1 pg (ref 26.0–34.0)
MCHC: 33.8 g/dL (ref 30.0–36.0)
MCV: 92.1 fL (ref 80.0–100.0)
Platelets: 282 10*3/uL (ref 150–400)
RBC: 4.05 MIL/uL (ref 3.87–5.11)
RDW: 13.7 % (ref 11.5–15.5)
WBC: 5 10*3/uL (ref 4.0–10.5)
nRBC: 0 % (ref 0.0–0.2)

## 2019-01-03 LAB — BASIC METABOLIC PANEL
Anion gap: 13 (ref 5–15)
BUN: 25 mg/dL — ABNORMAL HIGH (ref 8–23)
CO2: 27 mmol/L (ref 22–32)
Calcium: 9.3 mg/dL (ref 8.9–10.3)
Chloride: 96 mmol/L — ABNORMAL LOW (ref 98–111)
Creatinine, Ser: 9.11 mg/dL — ABNORMAL HIGH (ref 0.44–1.00)
GFR calc Af Amer: 5 mL/min — ABNORMAL LOW (ref >60)
GFR calc non Af Amer: 4 mL/min — ABNORMAL LOW (ref >60)
Glucose, Bld: 110 mg/dL — ABNORMAL HIGH (ref 70–99)
Potassium: 3.8 mmol/L (ref 3.5–5.1)
Sodium: 136 mmol/L (ref 135–145)

## 2019-01-03 LAB — TRIGLYCERIDES: Triglycerides: 68 mg/dL (ref <150)

## 2019-01-03 MED ORDER — FERRIC CITRATE 1 GM 210 MG(FE) PO TABS
210.0000 mg | ORAL_TABLET | Freq: Three times a day (TID) | ORAL | Status: DC
  Administered 2019-01-03 – 2019-01-04 (×2): 210 mg via ORAL
  Filled 2019-01-03 (×2): qty 1

## 2019-01-03 MED ORDER — CHLORHEXIDINE GLUCONATE CLOTH 2 % EX PADS: 6.0000 | MEDICATED_PAD | Freq: Every day | CUTANEOUS | Status: DC

## 2019-01-03 NOTE — Progress Notes (Signed)
[facility] Progress Note   Subjective:   Patient seen in room. Reports she is feeling better today. Ate breakfast this morning and has not had any abdominal pain yet. Denies N/V/D, SOB, CP, palpitations, and edema.  Objective Vitals:   01/02/19 1639 01/03/19 0500 01/03/19 0604 01/03/19 0829  BP: 113/80  (!) 155/81 130/66  Pulse: 83  84 82  Resp: 18  16 18   Temp: 98.2 F (36.8 C)  98.5 F (36.9 C) 98.9 F (37.2 C)  TempSrc: Oral  Oral Oral  SpO2: 95%  92% 97%  Weight:  65.2 kg    Height:       Physical Exam General: Frail appearing woman, alert and in NAD Heart: RRR, no murmurs, rubs or gallops Lungs: CTA bilaterally without wheezing, rhonchi or rales Abdomen: Soft, non-distended. + LUQ pain on light palpation Extremities: No peripheral edema Dialysis Access: LUE AVF + thrill  Additional Objective Labs: Basic Metabolic Panel: Recent Labs  Lab 12/31/18 0607 01/01/19 0810 01/03/19 0926  NA 137 132* 136  K 4.0 3.9 3.8  CL 96* 94* 96*  CO2 25 23 27   GLUCOSE 82 168* 110*  BUN 13 29* 25*  CREATININE 5.47* 8.52* 9.11*  CALCIUM 9.0 9.2 9.3  PHOS  --  6.3*  --    Liver Function Tests: Recent Labs  Lab 12/29/18 2258 12/31/18 0607 01/01/19 0810  AST 23 25  --   ALT 22 20  --   ALKPHOS 79 77  --   BILITOT 0.4 0.8  --   PROT 7.5 7.7  --   ALBUMIN 3.6 3.6 3.4*   Recent Labs  Lab 12/29/18 2258 12/31/18 0607 01/02/19 0540  LIPASE 134* 63* 42   CBC: Recent Labs  Lab 12/29/18 2258 12/31/18 0607 01/01/19 0810 01/03/19 0926  WBC 8.4 6.3 5.6 5.0  HGB 11.9* 12.5 11.7* 12.6  HCT 36.2 38.2 35.7* 37.3  MCV 94.5 94.3 93.5 92.1  PLT 309 310 334 282   Blood Culture    Component Value Date/Time   SDES URINE, RANDOM 06/20/2017 2222   SPECREQUEST NONE 06/20/2017 2222   CULT MULTIPLE SPECIES PRESENT, SUGGEST RECOLLECTION (A) 06/20/2017 2222   REPTSTATUS 06/22/2017 FINAL 06/20/2017 2222    Medications:  . amLODipine  10 mg Oral Daily  .  atorvastatin  40 mg Oral q1800  . Chlorhexidine Gluconate Cloth  6 each Topical Q0600  . doxercalciferol  2 mcg Oral Once per day on Tue Thu Sat  . heparin  5,000 Units Subcutaneous Q8H  . hydrALAZINE  25 mg Oral Q8H  . pantoprazole  40 mg Oral Daily  . sodium chloride flush  3 mL Intravenous Once    Dialysis Orders: TTS [facility] 3.5hr, 2K/2Ca, EDW 66.5kg, 350/800, LUE AVF, Profile 4, Hep 2000 bolus - Mircera 37mcg IV q 2 weeks (last 7/7) - Hectoral 40mcg IV q HD - Auryxia 210mg  1 tab TID with meals   Assessment/Plan: 1.Pancreatitis: No pseudocyst per abd CT, + mild inflammation of head of pancreas c/w pancreatitis.Afebrile. Lipase improving. Feeling better this morning. Ongoing work up per GI.  2. ESRD:Continue HD per TTS schedule -next tomorrow if still inpatient. K+ 3.8.  3.HTN/volume:BPvariable/slightly high, no edema. Continue amlodipine and hydralazine. No evidence of volume overload on exam today. UF with dialysis tomorrow as tolerated. Now below outpatient EDW, will likely need to be lowered at discharge. 4. Anemia:Hgb 12.6. No ESA indicated at this time.  5. Secondary hyperparathyroidism:Ca 9.3, Phos 6.3.ContinueVDRA. Will resume binders as  she is now tolerating some PO intake. 6.Hx CVA: Per primary 7. Hx Hep C: Outpatient GI follow up recommended.  Anice Paganini, PA-C 01/03/2019, 1:27 PM  [facility] Pager: [phone]

## 2019-01-03 NOTE — Progress Notes (Signed)
Daily Rounding Note  01/03/2019, 3:14 PM  LOS: 4 days   SUBJECTIVE:   Chief complaint: Acute pancreatitis. Patient's pain is pretty much gone.  She ate eggs and grits this morning and a hamburger and sides at lunch.  This did not cause any nausea or pain.  Still a little bit of tenderness to palpation in the left upper quadrant but no pain spontaneously or with movement.  Feels like her appetite has improved.  Has not had a bowel movement for about 3 days but does not feel like she needs a laxative yet.  OBJECTIVE:         Vital signs in last 24 hours:    Temp:  [98.2 F (36.8 C)-98.9 F (37.2 C)] 98.9 F (37.2 C) (07/27 0829) Pulse Rate:  [82-84] 82 (07/27 0829) Resp:  [16-18] 18 (07/27 0829) BP: (113-155)/(66-81) 130/66 (07/27 0829) SpO2:  [92 %-97 %] 97 % (07/27 0829) Weight:  [65.2 kg] 65.2 kg (07/27 0500) Last BM Date: 01/01/19 Filed Weights   01/01/19 1141 01/02/19 0558 01/03/19 0500  Weight: 67.8 kg 64.9 kg 65.2 kg   General: Patient looks a bit chronically ill but not significantly unwell and not acutely ill. Heart: RRR. Chest: Clear bilaterally.  Vocal quality hoarse. Abdomen: Active bowel sounds.  Not distended.  Tender without guarding or rebound in the left upper quadrant. Extremities: CCE.  Thrill palpable at fistula on inner right upper arm. Neuro/Psych: Pleasant, cooperative, fully alert and oriented.  No gross deficits.  Intake/Output from previous day: 07/26 0701 - 07/27 0700 In: 840 [P.O.:840] Out: 300 [Urine:300]  Intake/Output this shift: Total I/O In: 120 [P.O.:120] Out: 200 [Urine:200]  Lab Results: Recent Labs    01/01/19 0810 01/03/19 0926  WBC 5.6 5.0  HGB 11.7* 12.6  HCT 35.7* 37.3  PLT 334 282   BMET Recent Labs    01/01/19 0810 01/03/19 0926  NA 132* 136  K 3.9 3.8  CL 94* 96*  CO2 23 27  GLUCOSE 168* 110*  BUN 29* 25*  CREATININE 8.52* 9.11*  CALCIUM 9.2 9.3    LFT Recent Labs    01/01/19 0810  ALBUMIN 3.4*    Studies/Results: No results found.   Scheduled Meds: . amLODipine  10 mg Oral Daily  . atorvastatin  40 mg Oral q1800  . Chlorhexidine Gluconate Cloth  6 each Topical Q0600  . [START ON 01/04/2019] Chlorhexidine Gluconate Cloth  6 each Topical Q0600  . doxercalciferol  2 mcg Oral Once per day on Tue Thu Sat  . ferric citrate  210 mg Oral TID WC  . heparin  5,000 Units Subcutaneous Q8H  . hydrALAZINE  25 mg Oral Q8H  . pantoprazole  40 mg Oral Daily  . sodium chloride flush  3 mL Intravenous Once   Continuous Infusions: PRN Meds:.acetaminophen **OR** acetaminophen, HYDROmorphone (DILAUDID) injection, labetalol, ondansetron **OR** ondansetron (ZOFRAN) IV, oxyCODONE   ASSESMENT:   *    Recurrent, acute pancreatitis of unclear etiology. Lipase 134 >> 42.  Normal LFTs.    *   ESRD.   On hemodialysis TTS.  *     Hepatitis C, never treated.  No fatty liver or cirrhosis on CT 4 days ago. Patient met with Janene Madeira, NP from infectious disease in 04/2018 Plan was to obtain ultrasound for evaluation of liver, cirrhosis screening and then begin treatment with Mavyret.  However pt lost to follow-up, never had the ultrasound.Marland Kitchen  *  Repair of Perforated gastric ulcer 1990.   PLAN   *     Eventually will need MRCP as an outpatient and possibly endoscopic ultrasound.  In the meantime supportive care.  Follow-up will be with Dr. Zenovia Jarred  *    Patient eventually will need screening colonoscopy which she has never had in her lifetime.  *    Patient needs to get reestablished with the infectious disease clinic and get started on treatment for her hep C.  *    Patient is well enough to go home after dialysis treatment tomorrow.   Azucena Freed  01/03/2019, 3:14 PM Phone [phone]

## 2019-01-03 NOTE — Progress Notes (Signed)
PROGRESS NOTE  Ashley [name] TMA:[id number] DOB: 08-Aug-1950 DOA: 12/29/2018 PCP: Ladell Pier, MD  HPI/Recap of past 24 hours: Ashley [name] is a 68 y.o. female with medical history significant of prior pancreatitis, ESRD on HD TTS, HTN, who presented with significant epigastric abdominal pain of 2 days duration and was found to have elevated lipase.  Physical exam is suggestive of acute pancreatitis.  CT abdomen and pelvis without contrast done on admission is pending.  Denies vomiting or diarrhea.  Denies alcohol use.  She likes to eat greasy food.  Symptoms feel identical to prior pancreatitis.  She states she still makes urine about 2 times a day.  Patient had mild acute pancreatitis in May of this year, uncomplicated and ultimately idiopathic as work up didn't reveal an obvious cause at that time.  Evidence of acute pancreatitis on CT abdomen and pelvis with elevated lipase level.  Admitted for acute recurrent pancreatitis.  01/03/19: Patient was seen and examined on her bedside.  Patient reports persistent nausea and epigastric pain worse after eating.  GI is following.  She denies any cardiopulmonary symptoms.   Assessment/Plan: Principal Problem:   Acute pancreatitis Active Problems:   Peptic ulcer disease   Hypertension   ESRD (end stage renal disease) on dialysis [facility])   History of cardioembolic cerebrovascular accident (CVA)   Lesion of left native kidney   Acute recurrent pancreatitis, unclear etiology Presented with elevated lipase level, epigastric abdominal pain and evidence of pancreatitis on CT abdomen and pelvis No pseudocyst on CT scan States she has had pancreatitis x3 times No history of alcohol abuse Continue p.o. oxycodone as needed for moderate pain Continue IV Dilaudid as needed for severe pain Advance diet as tolerated Lipase 134>>63>> 42 Persistent nausea and epigastric pain worse with food Continue antiemetics GI following Triglyceride level  normal No leukocytosis, afebrile  Untreated hepatitis C  Follow-up with GI or ID clinic outpatient LFTs are stable No coagulopathy  ESRD on HD Tuesday Thursday Saturday Last hemodialysis 12/30/2018 HD Today 01/01/2019 Nephrology following  Resolved intermittent chest discomfort, unclear etiology  Essential hypertension Blood pressure at goal C/w amlodipine 10 mg daily and p.o. hydralazine 25 mg 3 times daily C/w  monitor vital signs IV labetalol 10 mg every 6 hours PRN for systolic blood pressure greater than 170  Hyperlipidemia Continue Lipitor 40 mg daily  History of peptic ulcer disease Continue PPI, Protonix 40 mg daily  History of left kidney nodule 10 mm in May 2020  History of hepatitis C Follow-up with GI      DVT prophylaxis: Heparin [place] 3 times daily Code Status: Full Family Communication: No family in room Disposition Plan:  Home possibly tomorrow 01/04/19 or when GI signs off. Consults called:  Nephro, GI.      Objective: Vitals:   01/02/19 1639 01/03/19 0500 01/03/19 0604 01/03/19 0829  BP: 113/80  (!) 155/81 130/66  Pulse: 83  84 82  Resp: 18  16 18   Temp: 98.2 F (36.8 C)  98.5 F (36.9 C) 98.9 F (37.2 C)  TempSrc: Oral  Oral Oral  SpO2: 95%  92% 97%  Weight:  65.2 kg    Height:        Intake/Output Summary (Last 24 hours) at 01/03/2019 1251 Last data filed at 01/03/2019 0831 Gross per 24 hour  Intake 720 ml  Output 500 ml  Net 220 ml   Filed Weights   01/01/19 1141 01/02/19 0558 01/03/19 0500  Weight: 67.8 kg  64.9 kg 65.2 kg    Exam:  . General: 68 y.o. year-old female well-developed well-nourished appears uncomfortable due to epigastric pain. . Cardiovascular: Regular rate and rhythm no rubs or gallops.  No JVD or thyromegaly. Marland Kitchen Respiratory: Clear to auscultation no wheezes or Rales.  Good inspiratory effort.  . Abdomen: Epigastric tenderness with palpation.  Hypoactive bowel sounds. . Musculoskeletal: No lower extremity  edema.  2 out of 4 pulses in all 4 extremities . Psychiatry: Mood appropriate for condition and setting.   Data Reviewed: CBC: Recent Labs  Lab 12/29/18 2258 12/31/18 0607 01/01/19 0810 01/03/19 0926  WBC 8.4 6.3 5.6 5.0  HGB 11.9* 12.5 11.7* 12.6  HCT 36.2 38.2 35.7* 37.3  MCV 94.5 94.3 93.5 92.1  PLT 309 310 334 253   Basic Metabolic Panel: Recent Labs  Lab 12/29/18 2258 12/31/18 0607 01/01/19 0810 01/03/19 0926  NA 138 137 132* 136  K 4.9 4.0 3.9 3.8  CL 97* 96* 94* 96*  CO2 26 25 23 27   GLUCOSE 91 82 168* 110*  BUN 45* 13 29* 25*  CREATININE 9.00* 5.47* 8.52* 9.11*  CALCIUM 9.7 9.0 9.2 9.3  PHOS  --   --  6.3*  --    GFR: Estimated Creatinine Clearance: 5.2 mL/min (A) (by C-G formula based on SCr of 9.11 mg/dL (H)). Liver Function Tests: Recent Labs  Lab 12/29/18 2258 12/31/18 0607 01/01/19 0810  AST 23 25  --   ALT 22 20  --   ALKPHOS 79 77  --   BILITOT 0.4 0.8  --   PROT 7.5 7.7  --   ALBUMIN 3.6 3.6 3.4*   Recent Labs  Lab 12/29/18 2258 12/31/18 0607 01/02/19 0540  LIPASE 134* 63* 42   No results for input(s): AMMONIA in the last 168 hours. Coagulation Profile: No results for input(s): INR, PROTIME in the last 168 hours. Cardiac Enzymes: No results for input(s): CKTOTAL, CKMB, CKMBINDEX, TROPONINI in the last 168 hours. BNP (last 3 results) No results for input(s): PROBNP in the last 8760 hours. HbA1C: No results for input(s): HGBA1C in the last 72 hours. CBG: No results for input(s): GLUCAP in the last 168 hours. Lipid Profile: Recent Labs    01/03/19 0927  TRIG 68   Thyroid Function Tests: No results for input(s): TSH, T4TOTAL, FREET4, T3FREE, THYROIDAB in the last 72 hours. Anemia Panel: No results for input(s): VITAMINB12, FOLATE, FERRITIN, TIBC, IRON, RETICCTPCT in the last 72 hours. Urine analysis:    Component Value Date/Time   COLORURINE YELLOW 12/31/2018 1014   APPEARANCEUR CLEAR 12/31/2018 1014   LABSPEC 1.008  12/31/2018 1014   PHURINE 9.0 (H) 12/31/2018 1014   GLUCOSEU NEGATIVE 12/31/2018 1014   HGBUR SMALL (A) 12/31/2018 1014   BILIRUBINUR NEGATIVE 12/31/2018 1014   BILIRUBINUR NEG 01/29/2016 1142   KETONESUR NEGATIVE 12/31/2018 1014   PROTEINUR >=300 (A) 12/31/2018 1014   UROBILINOGEN 0.2 01/29/2016 1142   UROBILINOGEN 0.2 03/21/2015 1649   NITRITE NEGATIVE 12/31/2018 1014   LEUKOCYTESUR NEGATIVE 12/31/2018 1014   Sepsis Labs: @LABRCNTIP (procalcitonin:4,lacticidven:4)  ) Recent Results (from the past 240 hour(s))  SARS Coronavirus 2 (CEPHEID - Performed in [place] hospital lab), Hosp Order     Status: None   Collection Time: 12/30/18  6:19 AM   Specimen: Nasopharyngeal Swab  Result Value Ref Range Status   SARS Coronavirus 2 NEGATIVE NEGATIVE Final    Comment: (NOTE) If result is NEGATIVE SARS-CoV-2 target nucleic acids are NOT DETECTED. The SARS-CoV-2 RNA  is generally detectable in upper and lower  respiratory specimens during the acute phase of infection. The lowest  concentration of SARS-CoV-2 viral copies this assay can detect is 250  copies / mL. A negative result does not preclude SARS-CoV-2 infection  and should not be used as the sole basis for treatment or other  patient management decisions.  A negative result may occur with  improper specimen collection / handling, submission of specimen other  than nasopharyngeal swab, presence of viral mutation(s) within the  areas targeted by this assay, and inadequate number of viral copies  (<250 copies / mL). A negative result must be combined with clinical  observations, patient history, and epidemiological information. If result is POSITIVE SARS-CoV-2 target nucleic acids are DETECTED. The SARS-CoV-2 RNA is generally detectable in upper and lower  respiratory specimens dur ing the acute phase of infection.  Positive  results are indicative of active infection with SARS-CoV-2.  Clinical  correlation with patient history  and other diagnostic information is  necessary to determine patient infection status.  Positive results do  not rule out bacterial infection or co-infection with other viruses. If result is PRESUMPTIVE POSTIVE SARS-CoV-2 nucleic acids MAY BE PRESENT.   A presumptive positive result was obtained on the submitted specimen  and confirmed on repeat testing.  While 2019 novel coronavirus  (SARS-CoV-2) nucleic acids may be present in the submitted sample  additional confirmatory testing may be necessary for epidemiological  and / or clinical management purposes  to differentiate between  SARS-CoV-2 and other Sarbecovirus currently known to infect humans.  If clinically indicated additional testing with an alternate test  methodology [phone]) is advised. The SARS-CoV-2 RNA is generally  detectable in upper and lower respiratory sp ecimens during the acute  phase of infection. The expected result is Negative. Fact Sheet for Patients:  StrictlyIdeas.no Fact Sheet for Healthcare Providers: BankingDealers.co.za This test is not yet approved or cleared by the [place] FDA and has been authorized for detection and/or diagnosis of SARS-CoV-2 by FDA under an Emergency Use Authorization (EUA).  This EUA will remain in effect (meaning this test can be used) for the duration of the COVID-19 declaration under Section 564(b)(1) of the Act, 21 U.S.C. section 360bbb-3(b)(1), unless the authorization is terminated or revoked sooner. Performed at [facility], [place] [address], [place], [place] [address]   MRSA PCR Screening     Status: None   Collection Time: 12/30/18 12:15 PM   Specimen: Nasal Mucosa; Nasopharyngeal  Result Value Ref Range Status   MRSA by PCR NEGATIVE NEGATIVE Final    Comment:        The GeneXpert MRSA Assay (FDA approved for NASAL specimens only), is one component of a comprehensive MRSA colonization surveillance program.  It is not intended to diagnose MRSA infection nor to guide or monitor treatment for MRSA infections. Performed at [facility], [place] [address] [place], [place] [address]       Studies: No results found.  Scheduled Meds: . amLODipine  10 mg Oral Daily  . atorvastatin  40 mg Oral q1800  . Chlorhexidine Gluconate Cloth  6 each Topical Q0600  . doxercalciferol  2 mcg Oral Once per day on Tue Thu Sat  . heparin  5,000 Units Subcutaneous Q8H  . hydrALAZINE  25 mg Oral Q8H  . pantoprazole  40 mg Oral Daily  . sodium chloride flush  3 mL Intravenous Once    Continuous Infusions:   LOS: 4  days     Kayleen Memos, MD [facility] Pager [phone]  If 7PM-7AM, please contact night-coverage [email or web address] Password [facility] 01/03/2019, 12:51 PM

## 2019-01-03 NOTE — Care Management Important Message (Signed)
Important Message  Patient Details  Name: Ashley [name] MRN: [id number] Date of Birth: 1951-02-22   Medicare Important Message Given:  Yes     Orbie Pyo 01/03/2019, 2:55 PM

## 2019-01-04 LAB — COMPREHENSIVE METABOLIC PANEL
ALT: 23 U/L (ref 0–44)
AST: 34 U/L (ref 15–41)
Albumin: 3.3 g/dL — ABNORMAL LOW (ref 3.5–5.0)
Alkaline Phosphatase: 72 U/L (ref 38–126)
Anion gap: 14 (ref 5–15)
BUN: 37 mg/dL — ABNORMAL HIGH (ref 8–23)
CO2: 24 mmol/L (ref 22–32)
Calcium: 9.3 mg/dL (ref 8.9–10.3)
Chloride: 98 mmol/L (ref 98–111)
Creatinine, Ser: 11.22 mg/dL — ABNORMAL HIGH (ref 0.44–1.00)
GFR calc Af Amer: 4 mL/min — ABNORMAL LOW (ref >60)
GFR calc non Af Amer: 3 mL/min — ABNORMAL LOW (ref >60)
Glucose, Bld: 85 mg/dL (ref 70–99)
Potassium: 4.5 mmol/L (ref 3.5–5.1)
Sodium: 136 mmol/L (ref 135–145)
Total Bilirubin: 0.3 mg/dL (ref 0.3–1.2)
Total Protein: 7.2 g/dL (ref 6.5–8.1)

## 2019-01-04 LAB — LIPASE, BLOOD: Lipase: 42 U/L (ref 11–51)

## 2019-01-04 MED ORDER — OXYCODONE HCL 5 MG PO TABS: 5.0000 mg | ORAL_TABLET | Freq: Two times a day (BID) | ORAL | 0 refills | Status: AC | PRN

## 2019-01-04 MED ORDER — DOXERCALCIFEROL 1 MCG PO CAPS: 2.0000 ug | ORAL_CAPSULE | ORAL | 0 refills | Status: DC

## 2019-01-04 MED ORDER — PANTOPRAZOLE SODIUM 40 MG PO TBEC: 40.0000 mg | DELAYED_RELEASE_TABLET | Freq: Every day | ORAL | 0 refills | Status: DC

## 2019-01-04 MED ORDER — FERRIC CITRATE 1 GM 210 MG(FE) PO TABS: 210.0000 mg | ORAL_TABLET | Freq: Three times a day (TID) | ORAL | 0 refills | Status: DC

## 2019-01-04 MED FILL — AURYXIA 210 MG TABLET: 1 GM | 30 days supply | Qty: 90 | Fill #0

## 2019-01-04 MED FILL — PANTOPRAZOLE SOD DR 40 MG T: 40 | 30 days supply | Qty: 30 | Fill #0

## 2019-01-04 MED FILL — oxyCODONE HCL 5 MG TABS: 5 | 7 days supply | Qty: 14 | Fill #0

## 2019-01-04 NOTE — Plan of Care (Signed)
  Problem: Education: Goal: Knowledge of General Education information will improve Description: Including pain rating scale, medication(s)/side effects and non-pharmacologic comfort measures Outcome: Adequate for Discharge   Problem: Health Behavior/Discharge Planning: Goal: Ability to manage health-related needs will improve Outcome: Adequate for Discharge   Problem: Clinical Measurements: Goal: Ability to maintain clinical measurements within normal limits will improve Outcome: Adequate for Discharge Goal: Will remain free from infection Outcome: Adequate for Discharge Goal: Diagnostic test results will improve Outcome: Adequate for Discharge Goal: Respiratory complications will improve Outcome: Adequate for Discharge Goal: Cardiovascular complication will be avoided Outcome: Adequate for Discharge   Problem: Activity: Goal: Risk for activity intolerance will decrease Outcome: Adequate for Discharge   Problem: Nutrition: Goal: Adequate nutrition will be maintained 01/04/2019 1029 by Baldo Ash, RN Outcome: Adequate for Discharge 01/04/2019 [phone] by Baldo Ash, RN Outcome: Progressing   Problem: Coping: Goal: Level of anxiety will decrease Outcome: Adequate for Discharge   Problem: Elimination: Goal: Will not experience complications related to bowel motility Outcome: Adequate for Discharge Goal: Will not experience complications related to urinary retention Outcome: Adequate for Discharge   Problem: Pain Managment: Goal: General experience of comfort will improve Outcome: Adequate for Discharge   Problem: Safety: Goal: Ability to remain free from injury will improve Outcome: Adequate for Discharge   Problem: Skin Integrity: Goal: Risk for impaired skin integrity will decrease Outcome: Adequate for Discharge

## 2019-01-04 NOTE — Progress Notes (Signed)
Ashley [name] to be discharged home per MD order. Discussed prescriptions and follow up appointments with the patient. Prescriptions given to patient; medication list explained in detail. Patient verbalized understanding.  Skin clean, dry and intact without evidence of skin break down, no evidence of skin tears noted. IV catheter discontinued intact. Site without signs and symptoms of complications. Dressing and pressure applied. Pt denies pain at the site currently. No complaints noted.  Patient free of lines, drains, and wounds.   An After Visit Summary (AVS) was printed and given to the patient. Patient escorted via wheelchair, and discharged home via private auto.  Baldo Ash, RN

## 2019-01-04 NOTE — Progress Notes (Signed)
Renal Navigator scheduled HD at [facility]'s [facility] at 12:00pm as her RN states she is scheduled for discharge today. Renal Navigator spoke with Dr. Foster/Nephrology who states patient can be discharged to home HD clinic today. Per [facility], they can pick her up from HD clinic and take her home if she can be taken from hospital to HD clinic. Renal Navigator spoke with CSW/V. Crawford to request a cab voucher to take patient to HD clinic from hospital.  Renal Navigator spoke with patient at bedside who was understanding. Patient's RN aware.  Alphonzo Cruise, [place] Renal Navigator [phone]

## 2019-01-04 NOTE — Clinical Social Work Note (Signed)
LATE ENTRY: Per request from Dialysis Coordinator Colleen, patient was provided with a cab voucher to get to HD center from hospital this morning. She will be transported home by [facility] after dialysis.   Keiko Myricks Givens, MSW, LCSW Licensed Clinical Social Worker [place] [phone]

## 2019-01-04 NOTE — Discharge Summary (Signed)
Discharge Summary  Ashley [name] LXB:[id number] DOB: 1951-04-08  PCP: Ladell Pier, MD  Admit date: 12/29/2018 Discharge date: 01/04/2019  Time spent: 35 minutes  Recommendations for Outpatient Follow-up:  1. Follow-up with GI 2. Follow-up with your primary care provider 3. Take your medications as prescribed  Discharge Diagnoses:  Active Hospital Problems   Diagnosis Date Noted   Acute pancreatitis 10/14/2018   Lesion of left native kidney 12/30/2018   History of cardioembolic cerebrovascular accident (CVA) 04/02/2018   ESRD (end stage renal disease) on dialysis ([place]) 06/23/2017   Hypertension    Peptic ulcer disease 11/13/2008    Resolved Hospital Problems  No resolved problems to display.    Discharge Condition: Stable  Diet recommendation: Resume previous diet  Vitals:   01/03/19 2059 01/04/19 0549  BP: (!) 142/66 134/66  Pulse: 78 78  Resp: 16 16  Temp: 98.8 F (37.1 C) 98.5 F (36.9 C)  SpO2: 99% 95%    History of present illness:  Ashley E Cooperis a 68 y.o.femalewith medical history significant ofprior pancreatitis, ESRD on HD TTS, HTN, who presented with significant epigastric abdominal pain of 2 days duration and was found to have elevated lipase.  Physical exam is suggestive of acute pancreatitis.  CT abdomen and pelvis without contrast done on admission is pending.  Denies vomiting or diarrhea.  Denies alcohol use.  She likes to eat greasy food.  Symptoms feel identical to prior pancreatitis.  She states she still makes urine about 2 times a day.  Patient had mild acute pancreatitis in May of this year, uncomplicated and ultimately idiopathic as work up didn't reveal an obvious cause at that time.  Evidence of acute pancreatitis on CT abdomen and pelvis with elevated lipase level.  Admitted for acute recurrent pancreatitis, idiopathic.  GI was consulted and followed.  She will follow-up with GI outpatient for possible MRI/MRCP/EUS in 3 to 5  weeks.  01/04/19: Patient was seen and examined at her bedside.  No acute events overnight.  She states she feels better today and she is tolerating a diet with no nausea or worsening abdominal pain.  On the day of discharge, the patient was hemodynamically stable.  She will need to follow-up with GI and her primary care provider posthospitalization.  Patient is scheduled for hemodialysis today 01/04/19 at 1200 outpatient.  Patient understands and agrees to plan.  01/03/19: Patient was seen and examined on her bedside.  Patient reports persistent nausea and epigastric pain worse after eating.  GI is following.  She denies any cardiopulmonary symptoms.   Hospital Course:  Principal Problem:   Acute pancreatitis Active Problems:   Peptic ulcer disease   Hypertension   ESRD (end stage renal disease) on dialysis ([place])   History of cardioembolic cerebrovascular accident (CVA)   Lesion of left native kidney  Acute recurrent pancreatitis, idiopathic Presented with elevated lipase level, epigastric abdominal pain and evidence of pancreatitis on CT abdomen and pelvis No pseudocyst on CT scan States she has had pancreatitis x3 times No history of alcohol abuse Triglyceride level normal GI consulted plan for MRI/MRCP/EUS outpatient in 3 to 5 weeks. Optimized pain control Lipase 134>>63>> 42>>42 Tolerating diet well Afebrile with no leukocytosis Follow-up with GI outpatient  Untreated hepatitis C  LFTs are stable No coagulopathy Follow-up with GI outpatient   ESRD on HD Tuesday Thursday Saturday Last hemodialysis 12/30/2018, HD on 01/01/2019 Planned outpatient HD on 01/04/2019 at noon Keep hemodialysis appointments  Resolved intermittent chest discomfort, unclear etiology  Essential hypertension Blood pressure is at goal, normotensive. Continue amlodipine 10 mg daily and p.o. hydralazine 25 mg 3 times daily  Hyperlipidemia Continue Lipitor 40 mg daily  History of peptic ulcer  disease Continue PPI, Protonix 40 mg daily  History of left kidney nodule 10 mm in May 2020 Follow-up with your primary care provider for surveillance   Code Status:Full  Consults called: Nephro, GI.   Discharge Exam: BP 134/66 (BP Location: Right Arm)    Pulse 78    Temp 98.5 F (36.9 C) (Oral)    Resp 16    Ht 5' 2.5" (1.588 m)    Wt 65 kg    SpO2 95%    BMI 25.79 kg/m   General: 68 y.o. year-old female well developed well nourished in no acute distress.  Alert and oriented x3.  Cardiovascular: Regular rate and rhythm with no rubs or gallops.  No thyromegaly or JVD noted.    Respiratory: Clear to auscultation with no wheezes or rales. Good inspiratory effort.  Abdomen: Soft nontender nondistended with normal bowel sounds x4 quadrants.  Musculoskeletal: No lower extremity edema. 2/4 pulses in all 4 extremities.  Psychiatry: Mood is appropriate for condition and setting  Discharge Instructions You were cared for by a hospitalist during your hospital stay. If you have any questions about your discharge medications or the care you received while you were in the hospital after you are discharged, you can call the unit and asked to speak with the hospitalist on call if the hospitalist that took care of you is not available. Once you are discharged, your primary care physician will handle any further medical issues. Please note that NO REFILLS for any discharge medications will be authorized once you are discharged, as it is imperative that you return to your primary care physician (or establish a relationship with a primary care physician if you do not have one) for your aftercare needs so that they can reassess your need for medications and monitor your lab values.   Allergies as of 01/04/2019      Reactions   Aspirin Nausea And Vomiting   Stomach ache   Ibuprofen Nausea And Vomiting   Stomach ache      Medication List    TAKE these medications   amLODipine 10 MG  tablet Commonly known as: NORVASC Take 1 tablet (10 mg total) by mouth daily.   atorvastatin 40 MG tablet Commonly known as: LIPITOR Take 1 tablet (40 mg total) by mouth daily at 6 PM.   ferric citrate 1 GM 210 MG(Fe) tablet Commonly known as: AURYXIA Take 1 tablet (210 mg total) by mouth 3 (three) times daily with meals.   hydrALAZINE 25 MG tablet Commonly known as: APRESOLINE Take 1 tablet (25 mg total) by mouth every 8 (eight) hours.   oxyCODONE 5 MG immediate release tablet Commonly known as: Oxy IR/ROXICODONE Take 1 tablet (5 mg total) by mouth 2 (two) times daily as needed for up to 7 days for moderate pain or breakthrough pain.   pantoprazole 40 MG tablet Commonly known as: PROTONIX Take 1 tablet (40 mg total) by mouth daily.      Allergies  Allergen Reactions   Aspirin Nausea And Vomiting    Stomach ache   Ibuprofen Nausea And Vomiting    Stomach ache   Follow-up Information    Ladell Pier, MD. Call in 1 day(s).   Specialty: Internal Medicine Why: Please call for a post hospital follow-up appointment. Contact information:  [address] [place] [address] [phone]        Jerene Bears, MD. Call in 1 day(s).   Specialty: Gastroenterology Why: Please call for a post hospital follow-up appointment. Contact information: [address] [place] [address] [phone]            The results of significant diagnostics from this hospitalization (including imaging, microbiology, ancillary and laboratory) are listed below for reference.    Significant Diagnostic Studies: Ct Abdomen Pelvis Wo Contrast  Result Date: 12/30/2018 CLINICAL DATA:  Abdominal pain.  Suspected pancreatitis. EXAM: CT ABDOMEN AND PELVIS WITHOUT CONTRAST TECHNIQUE: Multidetector CT imaging of the abdomen and pelvis was performed following the standard protocol without IV contrast. COMPARISON:  CT abdomen pelvis dated Oct 14, 2018. FINDINGS: Lower chest: No acute  abnormality. Bibasilar pleural-parenchymal scarring. Hepatobiliary: No focal liver abnormality is seen. No gallstones, gallbladder wall thickening, or biliary dilatation. Pancreas: Mild edema and stranding around the pancreatic head. Previously seen edema around the pancreatic body and tail has resolved. No ductal dilatation. Spleen: Normal in size without focal abnormality. Adrenals/Urinary Tract: The adrenal glands are unremarkable. Unchanged bilateral renal atrophy. No renal calculi or hydronephrosis. The bladder is unremarkable. Stomach/Bowel: Stomach is within normal limits. Appendix appears normal. No evidence of bowel wall thickening, distention, or inflammatory changes. Vascular/Lymphatic: Aortic atherosclerosis. No enlarged abdominal or pelvic lymph nodes. Reproductive: Status post hysterectomy. No adnexal masses. Other: No abdominal wall hernia or abnormality. No abdominopelvic ascites. No pneumoperitoneum. Musculoskeletal: No acute or significant osseous findings. IMPRESSION: 1. Mild edema and stranding around the head of the pancreas, consistent with acute pancreatitis. 2.  Aortic atherosclerosis (ICD10-I70.0). Electronically Signed   By: Titus Dubin M.D.   On: 12/30/2018 08:41   Dg Chest 2 View  Result Date: 12/15/2018 CLINICAL DATA:  68 year old female with chest pain. EXAM: CHEST - 2 VIEW COMPARISON:  Chest radiograph dated 07/31/2017 FINDINGS: Bibasilar linear and platelike atelectasis/scarring. No focal consolidation, pleural effusion, or pneumothorax. The cardiac silhouette is within normal limits. Atherosclerotic calcification of the aorta. No acute osseous pathology. A vascular stent noted in the region of the left axilla. IMPRESSION: No active cardiopulmonary disease. Electronically Signed   By: Anner Crete M.D.   On: 12/15/2018 11:42   Ct Angio Chest Pe W/cm &/or Wo Cm  Result Date: 12/15/2018 CLINICAL DATA:  Left-sided chest pain since this morning. EXAM: CT ANGIOGRAPHY CHEST  WITH CONTRAST TECHNIQUE: Multidetector CT imaging of the chest was performed using the standard protocol during bolus administration of intravenous contrast. Multiplanar CT image reconstructions and MIPs were obtained to evaluate the vascular anatomy. CONTRAST:  32mL OMNIPAQUE IOHEXOL 350 MG/ML SOLN COMPARISON:  CTA chest dated February 17, 2011. FINDINGS: Cardiovascular: Satisfactory opacification of the pulmonary arteries to the segmental level. No evidence of pulmonary embolism. Normal heart size. No pericardial effusion. No thoracic aortic aneurysm or dissection. Coronary, aortic arch, and branch vessel atherosclerotic vascular disease. Mediastinum/Nodes: Mildly prominent subcentimeter right hilar lymph nodes. No pathologically enlarged mediastinal, hilar, or axillary lymph nodes. Thyroid gland, trachea, and esophagus demonstrate no significant findings. Lungs/Pleura: Scattered pleuroparenchymal scarring and mild depend subsegmental atelectasis. No focal consolidation, pleural effusion, or pneumothorax. Unchanged mild paraseptal emphysema in the lung apices. Unchanged 3 mm nodule in the right upper lobe (series 8, image 32), stable since 2012, benign. Upper Abdomen: No acute abnormality. Musculoskeletal: No chest wall abnormality. No acute or significant osseous findings. Review of the MIP images confirms the above findings. IMPRESSION: 1. No evidence of pulmonary  embolism. No acute intrathoracic process. 2.  Emphysema (ICD10-J43.9). 3.  Aortic atherosclerosis (ICD10-I70.0). Electronically Signed   By: Titus Dubin M.D.   On: 12/15/2018 17:16    Microbiology: Recent Results (from the past 240 hour(s))  SARS Coronavirus 2 (CEPHEID - Performed in [place] hospital lab), Hosp Order     Status: None   Collection Time: 12/30/18  6:19 AM   Specimen: Nasopharyngeal Swab  Result Value Ref Range Status   SARS Coronavirus 2 NEGATIVE NEGATIVE Final    Comment: (NOTE) If result is NEGATIVE SARS-CoV-2  target nucleic acids are NOT DETECTED. The SARS-CoV-2 RNA is generally detectable in upper and lower  respiratory specimens during the acute phase of infection. The lowest  concentration of SARS-CoV-2 viral copies this assay can detect is 250  copies / mL. A negative result does not preclude SARS-CoV-2 infection  and should not be used as the sole basis for treatment or other  patient management decisions.  A negative result may occur with  improper specimen collection / handling, submission of specimen other  than nasopharyngeal swab, presence of viral mutation(s) within the  areas targeted by this assay, and inadequate number of viral copies  (<250 copies / mL). A negative result must be combined with clinical  observations, patient history, and epidemiological information. If result is POSITIVE SARS-CoV-2 target nucleic acids are DETECTED. The SARS-CoV-2 RNA is generally detectable in upper and lower  respiratory specimens dur ing the acute phase of infection.  Positive  results are indicative of active infection with SARS-CoV-2.  Clinical  correlation with patient history and other diagnostic information is  necessary to determine patient infection status.  Positive results do  not rule out bacterial infection or co-infection with other viruses. If result is PRESUMPTIVE POSTIVE SARS-CoV-2 nucleic acids MAY BE PRESENT.   A presumptive positive result was obtained on the submitted specimen  and confirmed on repeat testing.  While 2019 novel coronavirus  (SARS-CoV-2) nucleic acids may be present in the submitted sample  additional confirmatory testing may be necessary for epidemiological  and / or clinical management purposes  to differentiate between  SARS-CoV-2 and other Sarbecovirus currently known to infect humans.  If clinically indicated additional testing with an alternate test  methodology [phone]) is advised. The SARS-CoV-2 RNA is generally  detectable in upper and lower  respiratory sp ecimens during the acute  phase of infection. The expected result is Negative. Fact Sheet for Patients:  StrictlyIdeas.no Fact Sheet for Healthcare Providers: BankingDealers.co.za This test is not yet approved or cleared by the [place] FDA and has been authorized for detection and/or diagnosis of SARS-CoV-2 by FDA under an Emergency Use Authorization (EUA).  This EUA will remain in effect (meaning this test can be used) for the duration of the COVID-19 declaration under Section 564(b)(1) of the Act, 21 U.S.C. section 360bbb-3(b)(1), unless the authorization is terminated or revoked sooner. Performed at [facility], [place] [address] [place], [place] [address]   MRSA PCR Screening     Status: None   Collection Time: 12/30/18 12:15 PM   Specimen: Nasal Mucosa; Nasopharyngeal  Result Value Ref Range Status   MRSA by PCR NEGATIVE NEGATIVE Final    Comment:        The GeneXpert MRSA Assay (FDA approved for NASAL specimens only), is one component of a comprehensive MRSA colonization surveillance program. It is not intended to diagnose MRSA infection nor to guide or monitor treatment for MRSA infections. Performed at [facility]  [facility], [place] [address], [place], [place] [address]      Labs: Basic Metabolic Panel: Recent Labs  Lab 12/29/18 2258 12/31/18 0607 01/01/19 0810 01/03/19 0926 01/04/19 0649  NA 138 137 132* 136 136  K 4.9 4.0 3.9 3.8 4.5  CL 97* 96* 94* 96* 98  CO2 26 25 23 27 24   GLUCOSE 91 82 168* 110* 85  BUN 45* 13 29* 25* 37*  CREATININE 9.00* 5.47* 8.52* 9.11* 11.22*  CALCIUM 9.7 9.0 9.2 9.3 9.3  PHOS  --   --  6.3*  --   --    Liver Function Tests: Recent Labs  Lab 12/29/18 2258 12/31/18 0607 01/01/19 0810 01/04/19 0649  AST 23 25  --  34  ALT 22 20  --  23  ALKPHOS 79 77  --  72  BILITOT 0.4 0.8  --  0.3  PROT 7.5 7.7  --  7.2  ALBUMIN 3.6 3.6 3.4* 3.3*   Recent Labs   Lab 12/29/18 2258 12/31/18 0607 01/02/19 0540 01/04/19 0649  LIPASE 134* 63* 42 42   No results for input(s): AMMONIA in the last 168 hours. CBC: Recent Labs  Lab 12/29/18 2258 12/31/18 0607 01/01/19 0810 01/03/19 0926  WBC 8.4 6.3 5.6 5.0  HGB 11.9* 12.5 11.7* 12.6  HCT 36.2 38.2 35.7* 37.3  MCV 94.5 94.3 93.5 92.1  PLT 309 310 334 282   Cardiac Enzymes: No results for input(s): CKTOTAL, CKMB, CKMBINDEX, TROPONINI in the last 168 hours. BNP: BNP (last 3 results) No results for input(s): BNP in the last 8760 hours.  ProBNP (last 3 results) No results for input(s): PROBNP in the last 8760 hours.  CBG: No results for input(s): GLUCAP in the last 168 hours.     Signed:  Kayleen Memos, MD [facility] 01/04/2019, 9:56 AM

## 2019-01-04 NOTE — Plan of Care (Signed)
  Problem: Education: Goal: Knowledge of General Education information will improve Description: Including pain rating scale, medication(s)/side effects and non-pharmacologic comfort measures Outcome: Progressing   Problem: Pain Managment: Goal: General experience of comfort will improve Outcome: Progressing   

## 2019-01-04 NOTE — Plan of Care (Signed)
  Problem: Nutrition: Goal: Adequate nutrition will be maintained Outcome: Progressing   

## 2019-01-04 NOTE — Discharge Instructions (Signed)
Acute Pancreatitis  Acute pancreatitis happens when the pancreas gets swollen. The pancreas is a large gland in the body that helps to control blood sugar. It also makes enzymes that help to digest food. This condition can last a few days and cause serious problems. The lungs, heart, and kidneys may stop working. What are the causes? Causes include:  Alcohol abuse.  Drug abuse.  Gallstones.  A tumor in the pancreas. Other causes include:  Some medicines.  Some chemicals.  Diabetes.  An infection.  Damage caused by an accident.  The poison (venom) from a scorpion bite.  Belly (abdominal) surgery.  The body's defense system (immune system) attacking the pancreas (autoimmune pancreatitis).  Genes that are passed from parent to child (inherited). In some cases, the cause is not known. What are the signs or symptoms?  Pain in the upper belly that may be felt in the back. The pain may be very bad.  Swelling of the belly.  Feeling sick to your stomach (nauseous) and throwing up (vomiting).  Fever. How is this treated? You will likely have to stay in the hospital. Treatment may include:  Pain medicine.  Fluid through an IV tube.  Placing a tube in the stomach to take out the stomach contents. This may help you stop throwing up.  Not eating for 3-4 days.  Antibiotic medicines, if you have an infection.  Treating any other problems that may be the cause.  Steroid medicines, if your problem is caused by your defense system attacking your body's own tissues.  Surgery. Follow these instructions at home: Eating and drinking   Follow instructions from your doctor about what to eat and drink.  Eat foods that do not have a lot of fat in them.  Eat small meals often. Do not eat big meals.  Drink enough fluid to keep your pee (urine) pale yellow.  Do not drink alcohol if it caused your condition. Medicines  Take over-the-counter and prescription medicines only  as told by your doctor.  Ask your doctor if the medicine prescribed to you: ? Requires you to avoid driving or using heavy machinery. ? Can cause trouble pooping (constipation). You may need to take steps to prevent or treat trouble pooping:  Take over-the-counter or prescription medicines.  Eat foods that are high in fiber. These include beans, whole grains, and fresh fruits and vegetables.  Limit foods that are high in fat and sugar. These include fried or sweet foods. General instructions  Do not use any products that contain nicotine or tobacco, such as cigarettes, e-cigarettes, and chewing tobacco. If you need help quitting, ask your doctor.  Get plenty of rest.  Check your blood sugar at home as told by your doctor.  Keep all follow-up visits as told by your doctor. This is important. Contact a doctor if:  You do not get better as quickly as expected.  You have new symptoms.  Your symptoms get worse.  You have pain or weakness that lasts a long time.  You keep feeling sick to your stomach.  You get better and then you have pain again.  You have a fever. Get help right away if:  You cannot eat or keep fluids down.  Your pain gets very bad.  Your skin or the white part of your eyes turns yellow.  You have sudden swelling in your belly.  You throw up.  You feel dizzy or you pass out (faint).  Your blood sugar is high (over 300  mg/dL). Summary  Acute pancreatitis happens when the pancreas gets swollen.  This condition is often caused by alcohol abuse, drug abuse, or gallstones.  You will likely have to stay in the hospital for treatment. This information is not intended to replace advice given to you by your health care provider. Make sure you discuss any questions you have with your health care provider. Document Released: 11/12/2007 Document Revised: 03/15/2018 Document Reviewed: 03/15/2018 Elsevier Patient Education  2020 [employer].   Pancreatitis Eating Plan Pancreatitis is when your pancreas becomes irritated and swollen (inflamed). The pancreas is a small organ located behind your stomach. It helps your body digest food and regulate your blood sugar. Pancreatitis can affect how your body digests food, especially foods with fat. You may also have other symptoms such as abdominal pain or nausea. When you have pancreatitis, following a low-fat eating plan may help you manage symptoms and recover more quickly. Work with your health care provider or a diet and nutrition specialist (dietitian) to create an eating plan that is right for you. What are tips for following this plan? Reading food labels Use the information on food labels to help keep track of how much fat you eat:  Check the serving size.  Look for the amount of total fat in grams (g) in one serving. ? Low-fat foods have 3 g of fat or less per serving. ? Fat-free foods have 0.5 g of fat or less per serving.  Keep track of how much fat you eat based on how many servings you eat. ? For example, if you eat two servings, the amount of fat you eat will be two times what is listed on the label. Shopping   Buy low-fat or nonfat foods, such as: ? Fresh, frozen, or canned fruits and vegetables. ? Grains, including pasta, bread, and rice. ? Lean meat, poultry, fish, and other protein foods. ? Low-fat or nonfat dairy.  Avoid buying bakery products and other sweets made with whole milk, butter, and eggs.  Avoid buying snack foods with added fat, such as anything with butter or cheese flavoring. Cooking  Remove skin from poultry, and remove extra fat from meat.  Limit the amount of fat and oil you use to 6 teaspoons or less per day.  Cook using low-fat methods, such as boiling, broiling, grilling, steaming, or baking.  Use spray oil to cook. Add fat-free chicken broth to add flavor and moisture.  Avoid adding cream to thicken soups or sauces. Use other  thickeners such as corn starch or tomato paste. Meal planning   Eat a low-fat diet as told by your dietitian. For most people, this means having no more than 55-65 grams of fat each day.  Eat small, frequent meals throughout the day. For example, you may have 5-6 small meals instead of 3 large meals.  Drink enough fluid to keep your urine pale yellow.  Do not drink alcohol. Talk to your health care provider if you need help stopping.  Limit how much caffeine you have, including black coffee, black and green tea, caffeinated soft drinks, and energy drinks. General information  Let your health care provider or dietitian know if you have unplanned weight loss on this eating plan.  You may be instructed to follow a clear liquid diet during a flare of symptoms. Talk with your health care provider about how to manage your diet during symptoms of a flare.  Take any vitamins or supplements as told by your health care  provider.  Work with a Microbiologist, especially if you have other conditions such as obesity or diabetes mellitus. What foods should I avoid? Fruits Fried fruits. Fruits served with butter or cream. Vegetables Fried vegetables. Vegetables cooked with butter, cheese, or cream. Grains Biscuits, waffles, donuts, pastries, and croissants. Pies and cookies. Butter-flavored popcorn. Regular crackers. Meats and other protein foods Fatty cuts of meat. Poultry with skin. Organ meats. Bacon, sausage, and cold cuts. Whole eggs. Nuts and nut butters. Dairy Whole and 2% milk. Whole milk yogurt. Whole milk ice cream. Cream and half-and-half. Cream cheese. Sour cream. Cheese. Beverages Wine, beer, and liquor. The items listed above may not be a complete list of foods and beverages to avoid. Contact a dietitian for more information. Summary  Pancreatitis can affect how your body digests food, especially foods with fat.  When you have pancreatitis, it is recommended that you follow a low-fat  eating plan to help you recover more quickly and manage symptoms. For most people, this means limiting fat to no more than 55-65 grams per day.  Do not drink alcohol. Limit the amount of caffeine you have, and drink enough fluid to keep your urine pale yellow. This information is not intended to replace advice given to you by your health care provider. Make sure you discuss any questions you have with your health care provider. Document Released: 09/01/2017 Document Revised: 09/16/2018 Document Reviewed: 09/01/2017 Elsevier Patient Education  2020 [employer].

## 2019-01-05 LAB — IGG 4: IgG, Subclass 4: 25 mg/dL (ref 2–96)

## 2019-01-28 ENCOUNTER — Encounter: Payer: Self-pay | Admitting: Gastroenterology

## 2019-01-28 ENCOUNTER — Ambulatory Visit (INDEPENDENT_AMBULATORY_CARE_PROVIDER_SITE_OTHER): Payer: Medicare Other | Admitting: Gastroenterology

## 2019-01-28 VITALS — BP 144/80 | HR 83 | Temp 97.8°F | Ht 62.0 in | Wt 147.0 lb

## 2019-01-28 DIAGNOSIS — Z1211 Encounter for screening for malignant neoplasm of colon: Secondary | ICD-10-CM | POA: Diagnosis not present

## 2019-01-28 DIAGNOSIS — K858 Other acute pancreatitis without necrosis or infection: Secondary | ICD-10-CM | POA: Diagnosis not present

## 2019-01-28 NOTE — Patient Instructions (Signed)
You have been scheduled for an MRI at [facility]  on 02/04/2019. Your appointment time is 7:00pm. Please arrive 30 minutes prior to your appointment time for registration purposes. Please make certain not to have anything to eat or drink 4 hours prior to your test. In addition, if you have any metal in your body, have a pacemaker or defibrillator, please be sure to let your ordering physician know. This test typically takes 45 minutes to 1 hour to complete. Should you need to reschedule, please call [phone] to do so.  Try over the counter magnesium supplement for muscle cramps IF ok with dialysis.   Your provider has ordered Cologuard testing as an option for colon cancer screening. This is performed by [employer] and may be out of network with your insurance. PRIOR to completing the test, it is YOUR responsibility to contact your insurance about covered benefits for this test. Your out of pocket expense could be anywhere from $0.00 to $649.00.   When you call to check coverage with your insurer, please provide the following information:   -The ONLY provider of Cologuard is [place] code for Cologuard is [id number].  [employer] NPI # [id number]  -[employer] Tax ID # [id number]   We have already sent your demographic and insurance information to [employer] (phone number [phone]) and they should contact you within the next week regarding your test. If you have not heard from them within the next week, please call our office at [phone].  Thank you for choosing me and [facility].  Janett Billow Zehr-PA

## 2019-01-28 NOTE — Progress Notes (Signed)
01/28/2019 SAKENA WEHRS ON:[id number] 1950/08/23   HISTORY OF PRESENT ILLNESS: This is a 68 year old female who is a patient of Dr. Vena Rua.  Was apparently a Dr. Benson Norway patient by default during an unassigned hospital stay, but patient was seen by our service as inpatient as well and has elected to follow-up here.  Anyway, she's had recurrent pancreatitis.  Had one episode remotely in the past, but has had 2 episodes of mild pancreatitis now since May of this year.  No source found.  No evidence of gallstones.  No alcohol use.  IgG4 normal as well as normal triglycerides.  She was discharged from the hospital about 3 weeks ago at which time her symptoms were much improved and lipase had normalized.  LFTs have been normal.  Upon discharge from the hospital was recommended that she have an MRI abdomen/MRCP in 3 to 5 weeks to rule out pancreatic divisum, structural abnormalities.  May also need EUS to rule out microlithiasis, etc.  Currently she feels well.  Says that her appetite is not great, but she forces herself to eat.  Denies any abdominal pain, nausea, vomiting.  She has never had a colonoscopy in the past.  Denies any issues with moving her bowels or any sign of rectal bleeding.  Thinks that she had family history of colon cancer in apparent paternal uncle, but no first-degree relative.  She does have hepatitis C, genotype 2B.  Supposed to follow-up with infectious disease for treatment.   Past Medical History:  Diagnosis Date  . Acute kidney injury ([place]) 05/24/2013  . Acute pancreatitis 12/2018  . Arthritis   . Cervical radiculopathy 02/28/2011  . Chronic renal failure   . Cocaine substance abuse ([place]) 05/26/2013   positive UDS   . Gastropathy   . GERD (gastroesophageal reflux disease)   . Hepatitis C 1987   history of IVDA  . Hiatal hernia   . Hyperlipidemia   . Hypertension   . Marijuana abuse 12/18/204   positive UDS, family members smoke as well  . Pancreatitis 2000   resolved  . 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 Schatzki's ring   . Stroke ([place])   . Ulcer 1990   gastric ulcer. Ruptured s/p emergency repair   Past Surgical History:  Procedure Laterality Date  . ABDOMINAL HYSTERECTOMY  1979  . AV FISTULA PLACEMENT Left 06/16/2017   Procedure: ARTERIOVENOUS (AV) FISTULA CREATION LEFT ARM;  Surgeon: Conrad Clearmont, MD;  Location: [facility];  Service: Vascular;  Laterality: Left;  . BASCILIC VEIN TRANSPOSITION Left 10/02/2017   Procedure: BASILIC VEIN TRANSPOSITION SECOND STAGE LEFT ARM;  Surgeon: Rosetta Posner, MD;  Location: [facility] OR;  Service: Vascular;  Laterality: Left;  . ESOPHAGOGASTRODUODENOSCOPY N/A 05/29/2013   Procedure: ESOPHAGOGASTRODUODENOSCOPY (EGD);  Surgeon: Jerene Bears, MD;  Location: [facility] ENDOSCOPY;  Service: Endoscopy;  Laterality: N/A;  . EXCHANGE OF A DIALYSIS CATHETER Left 07/31/2017   Procedure: Removal  OF A  Right GroinTUNNELED  DIALYSIS CATHETER ,  Insertion of Left Femoral Dialysis Catheter.;  Surgeon: Rosetta Posner, MD;  Location: [place];  Service: Vascular;  Laterality: Left;  . INSERTION OF DIALYSIS CATHETER Right 06/16/2017   Procedure: INSERTION OF DIALYSIS CATHETER;  Surgeon: Conrad Vernon, MD;  Location: [facility];  Service: Vascular;  Laterality: Right;  . IR AV DIALY SHUNT INTRO [place] W/PTA/IMG LEFT  06/21/2018  . REPAIR OF PERFORATED ULCER      reports that she has been smoking  cigarettes. She has a 10.00 pack-year smoking history. She has never used smokeless tobacco. She reports previous drug use. Drugs: Heroin, Marijuana, and Cocaine. She reports that she does not drink alcohol. family history includes Cancer in her father; Early death in her daughter; Hyperlipidemia in her father; Hypertension in her father; Kidney disease in her daughter; Seizures in her sister. Allergies  Allergen Reactions  . Aspirin Nausea And Vomiting    Stomach ache  . Ibuprofen Nausea And Vomiting    Stomach ache       Outpatient Encounter Medications as of 01/28/2019  Medication Sig  . amLODipine (NORVASC) 10 MG tablet Take 1 tablet (10 mg total) by mouth daily.  Marland Kitchen atorvastatin (LIPITOR) 40 MG tablet Take 1 tablet (40 mg total) by mouth daily at 6 PM.  . ferric citrate (AURYXIA) 1 GM 210 MG(Fe) tablet Take 420 mg by mouth 2 (two) times daily with a meal.  . hydrALAZINE (APRESOLINE) 25 MG tablet Take 1 tablet (25 mg total) by mouth every 8 (eight) hours.  . pantoprazole (PROTONIX) 40 MG tablet Take 1 tablet (40 mg total) by mouth daily.  . [DISCONTINUED] ferric citrate (AURYXIA) 1 GM 210 MG(Fe) tablet Take 1 tablet (210 mg total) by mouth 3 (three) times daily with meals. (Patient taking differently: Take 210 mg by mouth 2 (two) times daily with a meal. )   No facility-administered encounter medications on file as of 01/28/2019.      REVIEW OF SYSTEMS  : All other systems reviewed and negative except where noted in the History of Present Illness.   PHYSICAL EXAM: BP (!) 144/80   Pulse 83   Temp 97.8 F (36.6 C)   Ht 5\' 2"  (1.575 m)   Wt 147 lb (66.7 kg)   BMI 26.89 kg/m  General: Well developed black female in no acute distress Head: Normocephalic and atraumatic Eyes:  Sclerae anicteric, conjunctiva pink. Ears: Normal auditory acuity Lungs: Clear throughout to auscultation; no increased WOB. Heart: Regular rate and rhythm; no M/R/G. Abdomen: Soft, non-distended.  BS present.  Mild upper abdominal TTP. Musculoskeletal: Symmetrical with no gross deformities  Skin: No lesions on visible extremities Extremities: No edema  Neurological: Alert oriented x 4, grossly non-focal Psychological:  Alert and cooperative. Normal mood and affect  ASSESSMENT AND PLAN: *Recurrent acute pancreatitis with unclear etiology: No gallstones noted, LFTs normal, no alcohol use.  IgG4 and triglyceride levels normal.  Will schedule MRI abdomen/MRCP without contrast to rule out pancreatic divisum/anatomic abnormalities.   May need to consider EUS to rule out microlithiasis, etc. *CRC screening: Discussed colonoscopy as well as Cologuard.  Patient would like to have Cologuard performed first, but is agreeable to colonoscopy if she has a positive Cologuard.  No family history of colon cancer no rectal bleeding/bowel complaints. *ESRD on HD TTS *Repair of perforated GU in 1990 *Hep C:  Needs treatment with ID.  Liver appears normal by ultrasound and CT scan.  LFTs normal.   CC:  Ladell Pier, MD

## 2019-02-03 ENCOUNTER — Inpatient Hospital Stay: Payer: Medicare Other | Admitting: Internal Medicine

## 2019-02-03 NOTE — Progress Notes (Signed)
Addendum: Reviewed and agree with assessment and management plan. Agree with MRI/MRCP, will followup results  Kirsta Probert, Lajuan Lines, MD

## 2019-02-04 ENCOUNTER — Other Ambulatory Visit: Payer: Self-pay

## 2019-02-04 ENCOUNTER — Ambulatory Visit: Payer: Medicare Other | Attending: Internal Medicine | Admitting: Internal Medicine

## 2019-02-04 ENCOUNTER — Encounter: Payer: Self-pay | Admitting: Internal Medicine

## 2019-02-04 ENCOUNTER — Ambulatory Visit (HOSPITAL_COMMUNITY): Admission: RE | Admit: 2019-02-04 | Payer: Medicare Other | Source: Ambulatory Visit

## 2019-02-04 ENCOUNTER — Ambulatory Visit (HOSPITAL_BASED_OUTPATIENT_CLINIC_OR_DEPARTMENT_OTHER): Payer: Medicare Other | Admitting: Pharmacist

## 2019-02-04 ENCOUNTER — Other Ambulatory Visit: Payer: Self-pay | Admitting: Gastroenterology

## 2019-02-04 VITALS — BP 150/80 | HR 80 | Temp 98.2°F | Resp 16 | Ht 62.0 in | Wt 149.4 lb

## 2019-02-04 DIAGNOSIS — IMO0002 Reserved for concepts with insufficient information to code with codable children: Secondary | ICD-10-CM

## 2019-02-04 DIAGNOSIS — I10 Essential (primary) hypertension: Secondary | ICD-10-CM

## 2019-02-04 DIAGNOSIS — Z09 Encounter for follow-up examination after completed treatment for conditions other than malignant neoplasm: Secondary | ICD-10-CM

## 2019-02-04 DIAGNOSIS — Z23 Encounter for immunization: Secondary | ICD-10-CM

## 2019-02-04 DIAGNOSIS — F1911 Other psychoactive substance abuse, in remission: Secondary | ICD-10-CM

## 2019-02-04 DIAGNOSIS — F17210 Nicotine dependence, cigarettes, uncomplicated: Secondary | ICD-10-CM

## 2019-02-04 DIAGNOSIS — B182 Chronic viral hepatitis C: Secondary | ICD-10-CM

## 2019-02-04 DIAGNOSIS — K858 Other acute pancreatitis without necrosis or infection: Secondary | ICD-10-CM

## 2019-02-04 DIAGNOSIS — F17200 Nicotine dependence, unspecified, uncomplicated: Secondary | ICD-10-CM | POA: Insufficient documentation

## 2019-02-04 DIAGNOSIS — K859 Acute pancreatitis without necrosis or infection, unspecified: Secondary | ICD-10-CM

## 2019-02-04 DIAGNOSIS — Z1239 Encounter for other screening for malignant neoplasm of breast: Secondary | ICD-10-CM

## 2019-02-04 DIAGNOSIS — F1120 Opioid dependence, uncomplicated: Secondary | ICD-10-CM | POA: Insufficient documentation

## 2019-02-04 MED ORDER — HYDRALAZINE HCL 50 MG PO TABS: 50.0000 mg | ORAL_TABLET | Freq: Three times a day (TID) | ORAL | 3 refills | Status: DC

## 2019-02-04 NOTE — Patient Instructions (Addendum)
Your blood pressure is not at goal of 130/80 or lower.  We have increased hydralazine to 50 mg 3 times a day.  Continue to limit salt in the foods.  You have received the flu vaccine and tetanus vaccine (Tdap) today.  You have been referred for a mammogram.  I have submitted a referral for you to see the infectious disease specialist for follow-up on the chronic hepatitis C.   Td Vaccine (Tetanus and Diphtheria): What You Need to Know 1. Why get vaccinated? Tetanus  and diphtheria are very serious diseases. They are rare in the [place] today, but people who do become infected often have severe complications. Td vaccine is used to protect adolescents and adults from both of these diseases. Both tetanus and diphtheria are infections caused by bacteria. Diphtheria spreads from person to person through coughing or sneezing. Tetanus-causing bacteria enter the body through cuts, scratches, or wounds. TETANUS (Lockjaw) causes painful muscle tightening and stiffness, usually all over the body.  It can lead to tightening of muscles in the head and neck so you can't open your mouth, swallow, or sometimes even breathe. Tetanus kills about 1 out of every 10 people who are infected even after receiving the best medical care. DIPHTHERIA can cause a thick coating to form in the back of the throat.  It can lead to breathing problems, paralysis, heart failure, and death. Before vaccines, as many as 200,000 cases of diphtheria and hundreds of cases of tetanus were reported in the [place] each year. Since vaccination began, reports of cases for both diseases have dropped by about 99%. 2. Td vaccine Td vaccine can protect adolescents and adults from tetanus and diphtheria. Td is usually given as a booster dose every 10 years but it can also be given earlier after a severe and dirty wound or burn. Another vaccine, called Tdap, which protects against pertussis in addition to tetanus and diphtheria, is  sometimes recommended instead of Td vaccine. Your doctor or the person giving you the vaccine can give you more information. Td may safely be given at the same time as other vaccines. 3. Some people should not get this vaccine  A person who has ever had a life-threatening allergic reaction after a previous dose of any tetanus or diphtheria containing vaccine, OR has a severe allergy to any part of this vaccine, should not get Td vaccine. Tell the person giving the vaccine about any severe allergies.  Talk to your doctor if you: ? had severe pain or swelling after any vaccine containing diphtheria or tetanus, ? ever had a condition called Guillain Barr Syndrome (GBS), ? aren't feeling well on the day the shot is scheduled. 4. Risks of a vaccine reaction With any medicine, including vaccines, there is a chance of side effects. These are usually mild and go away on their own. Serious reactions are also possible but are rare. Most people who get Td vaccine do not have any problems with it. Mild Problems following Td vaccine: (Did not interfere with activities)  Pain where the shot was given (about 8 people in 10)  Redness or swelling where the shot was given (about 1 person in 4)  Mild fever (rare)  Headache (about 1 person in 4)  Tiredness (about 1 person in 4) Moderate Problems following Td vaccine: (Interfered with activities, but did not require medical attention)  Fever over 102F (rare) Severe Problems following Td vaccine: (Unable to perform usual activities; required medical attention)  Swelling, severe pain,  bleeding and/or redness in the arm where the shot was given (rare). Problems that could happen after any vaccine:  People sometimes faint after a medical procedure, including vaccination. Sitting or lying down for about 15 minutes can help prevent fainting, and injuries caused by a fall. Tell your doctor if you feel dizzy, or have vision changes or ringing in the  ears.  Some people get severe pain in the shoulder and have difficulty moving the arm where a shot was given. This happens very rarely.  Any medication can cause a severe allergic reaction. Such reactions from a vaccine are very rare, estimated at fewer than 1 in a million doses, and would happen within a few minutes to a few hours after the vaccination. As with any medicine, there is a very remote chance of a vaccine causing a serious injury or death. The safety of vaccines is always being monitored. For more information, visit: http://www.aguilar.org/ 5. What if there is a serious reaction? What should I look for?  Look for anything that concerns you, such as signs of a severe allergic reaction, very high fever, or unusual behavior. Signs of a severe allergic reaction can include hives, swelling of the face and throat, difficulty breathing, a fast heartbeat, dizziness, and weakness. These would usually start a few minutes to a few hours after the vaccination. What should I do?  If you think it is a severe allergic reaction or other emergency that can't wait, call 9-1-1 or get the person to the nearest hospital. Otherwise, call your doctor.  Afterward, the reaction should be reported to the Vaccine Adverse Event Reporting System (VAERS). Your doctor might file this report, or you can do it yourself through the VAERS web site at www.vaers.SamedayNews.es, or by calling 916-657-0522. VAERS does not give medical advice. 6. The National Vaccine Injury Compensation Program The [employer] Vaccine Injury Compensation Program (VICP) is a federal program that was created to compensate people who may have been injured by certain vaccines. Persons who believe they may have been injured by a vaccine can learn about the program and about filing a claim by calling 682-671-4033 or visiting the [place] website at GoldCloset.com.ee. There is a time limit to file a claim for compensation. 7. How can I  learn more?  Ask your doctor. He or she can give you the vaccine package insert or suggest other sources of information.  Call your local or state health department.  Contact the Centers for Disease Control and Prevention (CDC): ? Call 510-396-5851 (1-800-CDC-INFO) ? Visit CDC's website at http://hunter.com/ Vaccine Information Statement Td Vaccine (09/18/15) This information is not intended to replace advice given to you by your health care provider. Make sure you discuss any questions you have with your health care provider. Document Released: 03/23/2006 Document Revised: 01/11/2018 Document Reviewed: 01/11/2018 Elsevier Interactive Patient Education  [place].   Influenza Virus Vaccine injection (Fluarix) What is this medicine? INFLUENZA VIRUS VACCINE (in floo EN zuh VAHY ruhs vak SEEN) helps to reduce the risk of getting influenza also known as the flu. This medicine may be used for other purposes; ask your health care provider or pharmacist if you have questions. COMMON BRAND NAME(S): Fluarix, Fluzone What should I tell my health care provider before I take this medicine? They need to know if you have any of these conditions:  bleeding disorder like hemophilia  fever or infection  Guillain-Barre syndrome or other neurological problems  immune system problems  infection with the human immunodeficiency virus (  HIV) or AIDS  low blood platelet counts  multiple sclerosis  an unusual or allergic reaction to influenza virus vaccine, eggs, chicken proteins, latex, gentamicin, other medicines, foods, dyes or preservatives  pregnant or trying to get pregnant  breast-feeding How should I use this medicine? This vaccine is for injection into a muscle. It is given by a health care professional. A copy of Vaccine Information Statements will be given before each vaccination. Read this sheet carefully each time. The sheet may change frequently. Talk to your  pediatrician regarding the use of this medicine in children. Special care may be needed. Overdosage: If you think you have taken too much of this medicine contact a poison control center or emergency room at once. NOTE: This medicine is only for you. Do not share this medicine with others. What if I miss a dose? This does not apply. What may interact with this medicine?  chemotherapy or radiation therapy  medicines that lower your immune system like etanercept, anakinra, infliximab, and adalimumab  medicines that treat or prevent blood clots like warfarin  phenytoin  steroid medicines like prednisone or cortisone  theophylline  vaccines This list may not describe all possible interactions. Give your health care provider a list of all the medicines, herbs, non-prescription drugs, or dietary supplements you use. Also tell them if you smoke, drink alcohol, or use illegal drugs. Some items may interact with your medicine. What should I watch for while using this medicine? Report any side effects that do not go away within 3 days to your doctor or health care professional. Call your health care provider if any unusual symptoms occur within 6 weeks of receiving this vaccine. You may still catch the flu, but the illness is not usually as bad. You cannot get the flu from the vaccine. The vaccine will not protect against colds or other illnesses that may cause fever. The vaccine is needed every year. What side effects may I notice from receiving this medicine? Side effects that you should report to your doctor or health care professional as soon as possible:  allergic reactions like skin rash, itching or hives, swelling of the face, lips, or tongue Side effects that usually do not require medical attention (report to your doctor or health care professional if they continue or are bothersome):  fever  headache  muscle aches and pains  pain, tenderness, redness, or swelling at site where  injected  weak or tired This list may not describe all possible side effects. Call your doctor for medical advice about side effects. You may report side effects to FDA at 1-800-FDA-1088. Where should I keep my medicine? This vaccine is only given in a clinic, pharmacy, doctor's office, or other health care setting and will not be stored at home. NOTE: This sheet is a summary. It may not cover all possible information. If you have questions about this medicine, talk to your doctor, pharmacist, or health care provider.  2020 Elsevier/Gold Standard (2007-12-22 09:30:40)
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
## 2019-02-04 NOTE — Progress Notes (Signed)
Patient presents for vaccination against tetanus and influenza per orders of Dr. Wynetta Emery. Consent given. Counseling provided. No contraindications exists. Vaccine administered without incident.

## 2019-02-04 NOTE — Progress Notes (Signed)
Pt states her whole body is in pain   

## 2019-02-08 ENCOUNTER — Telehealth: Payer: Self-pay

## 2019-02-08 NOTE — Telephone Encounter (Signed)
Incoming fax from [employer] for patients Cologuard. Fax indicates that the stool sample could not be processed. Pt has been notified that they will call her to initiate a new kit to be mailed to her home address.   

## 2019-02-15 ENCOUNTER — Emergency Department (HOSPITAL_COMMUNITY): Payer: Medicare Other

## 2019-02-15 ENCOUNTER — Emergency Department (HOSPITAL_COMMUNITY)
Admission: EM | Admit: 2019-02-15 | Discharge: 2019-02-15 | Discharge status: Against Medical Advice | Payer: Medicare Other | Attending: Emergency Medicine | Admitting: Emergency Medicine

## 2019-02-15 DIAGNOSIS — Z992 Dependence on renal dialysis: Secondary | ICD-10-CM | POA: Diagnosis not present

## 2019-02-15 DIAGNOSIS — R0600 Dyspnea, unspecified: Secondary | ICD-10-CM | POA: Insufficient documentation

## 2019-02-15 DIAGNOSIS — I120 Hypertensive chronic kidney disease with stage 5 chronic kidney disease or end stage renal disease: Secondary | ICD-10-CM | POA: Insufficient documentation

## 2019-02-15 DIAGNOSIS — Z79899 Other long term (current) drug therapy: Secondary | ICD-10-CM | POA: Diagnosis not present

## 2019-02-15 DIAGNOSIS — N186 End stage renal disease: Secondary | ICD-10-CM | POA: Diagnosis not present

## 2019-02-15 DIAGNOSIS — R0789 Other chest pain: Secondary | ICD-10-CM | POA: Diagnosis not present

## 2019-02-15 DIAGNOSIS — F17210 Nicotine dependence, cigarettes, uncomplicated: Secondary | ICD-10-CM | POA: Diagnosis not present

## 2019-02-15 DIAGNOSIS — Z5321 Procedure and treatment not carried out due to patient leaving prior to being seen by health care provider: Secondary | ICD-10-CM

## 2019-02-15 LAB — CBC
HCT: 33.3 % — ABNORMAL LOW (ref 36.0–46.0)
Hemoglobin: 10.9 g/dL — ABNORMAL LOW (ref 12.0–15.0)
MCH: 31.7 pg (ref 26.0–34.0)
MCHC: 32.7 g/dL (ref 30.0–36.0)
MCV: 96.8 fL (ref 80.0–100.0)
Platelets: 373 10*3/uL (ref 150–400)
RBC: 3.44 MIL/uL — ABNORMAL LOW (ref 3.87–5.11)
RDW: 16.9 % — ABNORMAL HIGH (ref 11.5–15.5)
WBC: 7.8 10*3/uL (ref 4.0–10.5)
nRBC: 0 % (ref 0.0–0.2)

## 2019-02-15 LAB — TROPONIN I (HIGH SENSITIVITY): Troponin I (High Sensitivity): 4 ng/L (ref <18)

## 2019-02-15 LAB — BASIC METABOLIC PANEL
Anion gap: 15 (ref 5–15)
BUN: 22 mg/dL (ref 8–23)
CO2: 24 mmol/L (ref 22–32)
Calcium: 8.1 mg/dL — ABNORMAL LOW (ref 8.9–10.3)
Chloride: 101 mmol/L (ref 98–111)
Creatinine, Ser: 6.39 mg/dL — ABNORMAL HIGH (ref 0.44–1.00)
GFR calc Af Amer: 7 mL/min — ABNORMAL LOW (ref >60)
GFR calc non Af Amer: 6 mL/min — ABNORMAL LOW (ref >60)
Glucose, Bld: 92 mg/dL (ref 70–99)
Potassium: 3.8 mmol/L (ref 3.5–5.1)
Sodium: 140 mmol/L (ref 135–145)

## 2019-02-15 MED ORDER — SODIUM CHLORIDE 0.9% FLUSH: 3.0000 mL | Freq: Once | INTRAVENOUS | Status: DC

## 2019-02-15 NOTE — ED Notes (Signed)
Went to draw pt's trop, pt not in room. Notified Kayla(RN) & Claudia(PA) will wait 5 minutes to see if pt returns.

## 2019-02-15 NOTE — ED Notes (Signed)
Pt hasn't returned, notified Claudia(PA)

## 2019-02-15 NOTE — ED Triage Notes (Signed)
Experienced 10/10 chest pain while on dialysis treatment this morning.  She did not get to complete her treatment.  She reports she is scheduled for an cardiac MRi on 9/28.  Her pain has decreased to 9/10 since discontinuing treatment.

## 2019-02-15 NOTE — ED Notes (Signed)
Pt left without notifying staff. PA notified.
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
## 2019-02-18 ENCOUNTER — Other Ambulatory Visit: Payer: Self-pay | Admitting: Cardiology

## 2019-02-18 ENCOUNTER — Other Ambulatory Visit (HOSPITAL_COMMUNITY): Payer: Self-pay | Admitting: Cardiology

## 2019-02-18 DIAGNOSIS — R079 Chest pain, unspecified: Secondary | ICD-10-CM

## 2019-02-25 ENCOUNTER — Ambulatory Visit (HOSPITAL_COMMUNITY): Payer: Medicare Other

## 2019-02-28 ENCOUNTER — Other Ambulatory Visit: Payer: Self-pay

## 2019-02-28 ENCOUNTER — Emergency Department (HOSPITAL_COMMUNITY)
Admission: EM | Admit: 2019-02-28 | Discharge: 2019-03-01 | Disposition: A | Discharge status: Home / Self Care | Payer: Medicare Other | Attending: Emergency Medicine | Admitting: Emergency Medicine

## 2019-02-28 ENCOUNTER — Encounter (HOSPITAL_COMMUNITY): Payer: Self-pay

## 2019-02-28 DIAGNOSIS — R1013 Epigastric pain: Secondary | ICD-10-CM | POA: Diagnosis present

## 2019-02-28 DIAGNOSIS — I120 Hypertensive chronic kidney disease with stage 5 chronic kidney disease or end stage renal disease: Secondary | ICD-10-CM | POA: Diagnosis not present

## 2019-02-28 DIAGNOSIS — N186 End stage renal disease: Secondary | ICD-10-CM | POA: Diagnosis not present

## 2019-02-28 DIAGNOSIS — Z79899 Other long term (current) drug therapy: Secondary | ICD-10-CM | POA: Diagnosis not present

## 2019-02-28 DIAGNOSIS — K859 Acute pancreatitis without necrosis or infection, unspecified: Secondary | ICD-10-CM | POA: Insufficient documentation

## 2019-02-28 DIAGNOSIS — F17210 Nicotine dependence, cigarettes, uncomplicated: Secondary | ICD-10-CM | POA: Insufficient documentation

## 2019-02-28 DIAGNOSIS — Z992 Dependence on renal dialysis: Secondary | ICD-10-CM | POA: Insufficient documentation

## 2019-02-28 LAB — CBC
HCT: 27.8 % — ABNORMAL LOW (ref 36.0–46.0)
Hemoglobin: 9.4 g/dL — ABNORMAL LOW (ref 12.0–15.0)
MCH: 32.4 pg (ref 26.0–34.0)
MCHC: 33.8 g/dL (ref 30.0–36.0)
MCV: 95.9 fL (ref 80.0–100.0)
Platelets: 269 10*3/uL (ref 150–400)
RBC: 2.9 MIL/uL — ABNORMAL LOW (ref 3.87–5.11)
RDW: 16.2 % — ABNORMAL HIGH (ref 11.5–15.5)
WBC: 7.5 10*3/uL (ref 4.0–10.5)
nRBC: 0 % (ref 0.0–0.2)

## 2019-02-28 LAB — COMPREHENSIVE METABOLIC PANEL
ALT: 19 U/L (ref 0–44)
AST: 25 U/L (ref 15–41)
Albumin: 3.5 g/dL (ref 3.5–5.0)
Alkaline Phosphatase: 75 U/L (ref 38–126)
Anion gap: 13 (ref 5–15)
BUN: 65 mg/dL — ABNORMAL HIGH (ref 8–23)
CO2: 28 mmol/L (ref 22–32)
Calcium: 8.4 mg/dL — ABNORMAL LOW (ref 8.9–10.3)
Chloride: 98 mmol/L (ref 98–111)
Creatinine, Ser: 9.61 mg/dL — ABNORMAL HIGH (ref 0.44–1.00)
GFR calc Af Amer: 4 mL/min — ABNORMAL LOW (ref >60)
GFR calc non Af Amer: 4 mL/min — ABNORMAL LOW (ref >60)
Glucose, Bld: 108 mg/dL — ABNORMAL HIGH (ref 70–99)
Potassium: 4.7 mmol/L (ref 3.5–5.1)
Sodium: 139 mmol/L (ref 135–145)
Total Bilirubin: 0.5 mg/dL (ref 0.3–1.2)
Total Protein: 7.1 g/dL (ref 6.5–8.1)

## 2019-02-28 LAB — LIPASE, BLOOD: Lipase: 123 U/L — ABNORMAL HIGH (ref 11–51)

## 2019-02-28 MED ORDER — MORPHINE SULFATE (PF) 4 MG/ML IV SOLN
4.0000 mg | Freq: Once | INTRAVENOUS | Status: AC
  Administered 2019-02-28: 4 mg via INTRAVENOUS
  Filled 2019-02-28: qty 1

## 2019-02-28 MED ORDER — METOCLOPRAMIDE HCL 5 MG/ML IJ SOLN
10.0000 mg | Freq: Once | INTRAMUSCULAR | Status: AC
  Administered 2019-02-28: 10 mg via INTRAVENOUS
  Filled 2019-02-28: qty 2

## 2019-02-28 MED ORDER — SODIUM CHLORIDE 0.9% FLUSH
3.0000 mL | Freq: Once | INTRAVENOUS | Status: AC
  Administered 2019-03-01: 03:00:00 3 mL via INTRAVENOUS

## 2019-02-28 NOTE — ED Triage Notes (Signed)
Pt from home with ems for c.o generalized abd pain since Friday. Pt receives dialysis T/Th/Sat, last treatment was on Saturday. Pt endorsing some nausea, no vomiting, 1 episode of diarrhea this morning. Pt a.o, nad noted.

## 2019-02-28 NOTE — ED Notes (Signed)
Pt advises she is a dialysis pt, unable to provide urine sample.
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
## 2019-03-01 ENCOUNTER — Emergency Department (HOSPITAL_COMMUNITY): Payer: Medicare Other

## 2019-03-01 MED ORDER — HYDROMORPHONE HCL 1 MG/ML IJ SOLN
1.0000 mg | Freq: Once | INTRAMUSCULAR | Status: AC
  Administered 2019-03-01: 03:00:00 1 mg via INTRAVENOUS
  Filled 2019-03-01: qty 1

## 2019-03-01 MED ORDER — OXYCODONE HCL 5 MG PO TABS: 5.0000 mg | ORAL_TABLET | ORAL | 0 refills | Status: AC | PRN

## 2019-03-01 NOTE — Discharge Instructions (Signed)
You were seen in the ER for abdominal pain.  Your pancreas levels were elevated.  There was some inflammation around your pancreas on CT but no other complications.  I suspect your pain is from a flare of pancreatitis.  Treatment of mild pancreatitis includes pain control, nausea control and clear liquid diet.  Take 500 to 1000 mg of acetaminophen every 6 hours for mild to moderate pain.  For more severe breakthrough pain take 5 mg of oxycodone every 4 hours or as needed.  Use Phenergan for nausea as needed.  Stick to a clear liquid diet for the next 48 to 72 hours and slowly resume normal diet as pain improves.  Go to your appointment for MRI/MRCP by gastroenterology next week.  Return to the ER for worsening pain despite medicines, persistent nausea or vomiting, fevers, chest pain

## 2019-03-01 NOTE — ED Notes (Signed)
Pt oxygen dropped to 82% while sleeping and after receiving pain meds, placed on 2L

## 2019-03-01 NOTE — ED Notes (Signed)
Patient verbalizes understanding of discharge instructions. Opportunity for questioning and answers were provided. Armband removed by staff, pt discharged from ED ambulatory.   

## 2019-03-04 ENCOUNTER — Ambulatory Visit (HOSPITAL_COMMUNITY): Admission: RE | Admit: 2019-03-04 | Payer: Medicare Other | Source: Ambulatory Visit

## 2019-03-09 ENCOUNTER — Ambulatory Visit (HOSPITAL_COMMUNITY): Admission: RE | Admit: 2019-03-09 | Payer: Medicare Other | Source: Ambulatory Visit

## 2019-03-18 ENCOUNTER — Other Ambulatory Visit: Payer: Self-pay

## 2019-03-18 ENCOUNTER — Encounter (HOSPITAL_COMMUNITY)
Admission: RE | Admit: 2019-03-18 | Discharge: 2019-03-18 | Disposition: A | Discharge status: Home / Self Care | Payer: Medicare Other | Source: Ambulatory Visit | Attending: Cardiology | Admitting: Cardiology

## 2019-03-18 DIAGNOSIS — R079 Chest pain, unspecified: Secondary | ICD-10-CM | POA: Diagnosis present

## 2019-03-18 MED ORDER — TECHNETIUM TC 99M TETROFOSMIN IV KIT: 10.0000 | PACK | Freq: Once | INTRAVENOUS | Status: DC | PRN

## 2019-03-18 MED ORDER — TECHNETIUM TC 99M TETROFOSMIN IV KIT
10.0000 | PACK | Freq: Once | INTRAVENOUS | Status: AC | PRN
  Administered 2019-03-18: 10 via INTRAVENOUS

## 2019-03-18 MED ORDER — TECHNETIUM TC 99M TETROFOSMIN IV KIT
30.0000 | PACK | Freq: Once | INTRAVENOUS | Status: AC | PRN
  Administered 2019-03-18: 30 via INTRAVENOUS

## 2019-03-18 MED ORDER — REGADENOSON 0.4 MG/5ML IV SOLN
INTRAVENOUS | Status: AC
  Administered 2019-03-18: 0.4 mg via INTRAVENOUS
  Filled 2019-03-18: qty 5

## 2019-03-18 MED ORDER — REGADENOSON 0.4 MG/5ML IV SOLN
0.4000 mg | Freq: Once | INTRAVENOUS | Status: AC
  Administered 2019-03-18: 10:00:00 0.4 mg via INTRAVENOUS

## 2019-03-30 ENCOUNTER — Other Ambulatory Visit: Payer: Self-pay

## 2019-03-30 ENCOUNTER — Ambulatory Visit
Admission: RE | Admit: 2019-03-30 | Discharge: 2019-03-30 | Disposition: A | Discharge status: Home / Self Care | Payer: Medicare Other | Source: Ambulatory Visit | Attending: Internal Medicine | Admitting: Internal Medicine

## 2019-03-30 DIAGNOSIS — Z1239 Encounter for other screening for malignant neoplasm of breast: Secondary | ICD-10-CM

## 2019-03-31 ENCOUNTER — Telehealth: Payer: Self-pay

## 2019-03-31 NOTE — Telephone Encounter (Signed)
Contacted pt to go over MM results pt is aware and doesn't have any questions or concerns

## 2019-04-05 ENCOUNTER — Telehealth: Payer: Self-pay

## 2019-04-05 NOTE — Telephone Encounter (Signed)
COVID-19 Pre-Screening Questions:04/05/19   Do you currently have a fever (>100 F), chills or unexplained body aches? NO  Are you currently experiencing new cough, shortness of breath, sore throat, runny nose? NO .  . Have you recently travelled outside the state of [place] in the last 14 days? NO .  Have you been in contact with someone that is currently pending confirmation of Covid19 testing or has been confirmed to have the Covid19 virus?  NO  **If the patient answers NO to ALL questions -  advise the patient to please call the clinic before coming to the office should any symptoms develop.     

## 2019-04-06 ENCOUNTER — Other Ambulatory Visit: Payer: Self-pay

## 2019-04-06 ENCOUNTER — Ambulatory Visit (INDEPENDENT_AMBULATORY_CARE_PROVIDER_SITE_OTHER): Payer: Medicare Other | Admitting: Family

## 2019-04-06 ENCOUNTER — Encounter: Payer: Self-pay | Admitting: Family

## 2019-04-06 ENCOUNTER — Telehealth: Payer: Self-pay | Admitting: Pharmacy Technician

## 2019-04-06 VITALS — BP 173/84 | HR 82 | Temp 98.6°F | Wt 147.0 lb

## 2019-04-06 DIAGNOSIS — E785 Hyperlipidemia, unspecified: Secondary | ICD-10-CM

## 2019-04-06 DIAGNOSIS — B182 Chronic viral hepatitis C: Secondary | ICD-10-CM | POA: Diagnosis not present
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
## 2019-04-06 NOTE — Patient Instructions (Signed)
Nice to meet you.  We will check your blood work today.  I will reach out to your primary care provider about changing atorvastatin to rosuvastatin.  Limit acetaminophen (Tylenol) usage to no more than 2 grams (2,000 mg) per day.  Avoid alcohol.  Do not share toothbrushes or razors.  Practice safe sex to protect against transmission as well as sexually transmitted disease.    Hepatitis C Hepatitis C is a viral infection of the liver. It can lead to scarring of the liver (cirrhosis), liver failure, or liver cancer. Hepatitis C may go undetected for months or years because people with the infection may not have symptoms, or they may have only mild symptoms. What are the causes? This condition is caused by the hepatitis C virus (HCV). The virus can spread from person to person (is contagious) through:  Blood.  Childbirth. A woman who has hepatitis C can pass it to her baby during birth.  Bodily fluids, such as breast milk, tears, semen, vaginal fluids, and saliva.  Blood transfusions or organ transplants done in the [place] before 1992.  What increases the risk? The following factors may make you more likely to develop this condition:  Having contact with unclean (contaminated) needles or syringes. This may result from: ? Acupuncture. ? Tattoing. ? Body piercing. ? Injecting drugs.  Having unprotected sex with someone who is infected.  Needing treatment to filter your blood (kidney dialysis).  Having HIV (human immunodeficiency virus) or AIDS (acquired immunodeficiency syndrome).  Working in a job that involves contact with blood or bodily fluids, such as health care.  What are the signs or symptoms? Symptoms of this condition include:  Fatigue.  Loss of appetite.  Nausea.  Vomiting.  Abdominal pain.  Dark yellow urine.  Yellowish skin and eyes (jaundice).  Itchy skin.  Clay-colored bowel movements.  Joint pain.  Bleeding and bruising easily.   Fluid building up in your stomach (ascites).  In some cases, you may not have any symptoms. How is this diagnosed? This condition is diagnosed with:  Blood tests.  Other tests to check how well your liver is functioning. They may include: ? Magnetic resonance elastography (MRE). This imaging test uses MRIs and sound waves to measure liver stiffness. ? Transient elastography. This imaging test uses ultrasounds to measure liver stiffness. ? Liver biopsy. This test requires taking a small tissue sample from your liver to examine it under a microscope.  How is this treated? Your health care provider may perform noninvasive tests or a liver biopsy to help decide the best course of treatment. Treatment may include:  Antiviral medicines and other medicines.  Follow-up treatments every 6-12 months for infections or other liver conditions.  Receiving a donated liver (liver transplant).  Follow these instructions at home: Medicines  Take over-the-counter and prescription medicines only as told by your health care provider.  Take your antiviral medicine as told by your health care provider. Do not stop taking the antiviral even if you start to feel better.  Do not take any medicines unless approved by your health care provider, including over-the-counter medicines and birth control pills. Activity  Rest as needed.  Do not have sex unless approved by your health care provider.  Ask your health care provider when you may return to school or work. Eating and drinking  Eat a balanced diet with plenty of fruits and vegetables, whole grains, and lowfat (lean) meats or non-meat proteins (such as beans or tofu).  Drink enough fluids  to keep your urine clear or pale yellow.  Do not drink alcohol. General instructions  Do not share toothbrushes, nail clippers, or razors.  Wash your hands frequently with soap and water. If soap and water are not available, use hand sanitizer.  Cover any  cuts or open sores on your skin to prevent spreading the virus.  Keep all follow-up visits as told by your health care provider. This is important. You may need follow-up visits every 6-12 months. How is this prevented? There is no vaccine for hepatitis C. The only way to prevent the disease is to reduce the risk of exposure to the virus. Make sure you:  Wash your hands frequently with soap and water. If soap and water are not available, use hand sanitizer.  Do not share needles or syringes.  Practice safe sex and use condoms.  Avoid handling blood or bodily fluids without gloves or other protection.  Avoid getting tattoos or piercings in shops or other locations that are not clean.  Contact a health care provider if:  You have a fever.  You develop abdominal pain.  You pass dark urine.  You pass clay-colored stools.  You develop joint pain. Get help right away if:  You have increasing fatigue or weakness.  You lose your appetite.  You cannot eat or drink without vomiting.  You develop jaundice or your jaundice gets worse.  You bruise or bleed easily. Summary  Hepatitis C is a viral infection of the liver. It can lead to scarring of the liver (cirrhosis), liver failure, or liver cancer.  The hepatitis C virus (HCV) causes this condition. The virus can pass from person to person (is contagious).  You should not take any medicines unless approved by your health care provider. This includes over-the-counter medicines and birth control pills. This information is not intended to replace advice given to you by your health care provider. Make sure you discuss any questions you have with your health care provider. Document Released: 05/23/2000 Document Revised: 07/01/2016 Document Reviewed: 07/01/2016 Elsevier Interactive Patient Education  [employer].

## 2019-04-06 NOTE — Assessment & Plan Note (Signed)
Ashley [name] currently takes atorvastatin which interacts with Hepatitis C medications. Have sent message to primary care team about changing atorvastatin to rosuvastatin during Hepatitis C treatment.

## 2019-04-06 NOTE — Telephone Encounter (Signed)
[facility] Patient Teacher, English as a foreign language completed.    The patient is insured through [employer] and has a ? copay (to be determined).  We will continue to follow to see if copay assistance is needed.  Venida Jarvis. Nadara Mustard [place] Patient [facility] for Infectious Disease Phone: [phone] Fax:  [phone]

## 2019-04-06 NOTE — Assessment & Plan Note (Signed)
Ms. Aumann is a 68 y/o female with Genotype 2b, asymptomatic, chronic Hepatitis C likely acquired from history of IV drug use. She is treatment naive and previous work up with low risk of fibrosis based on APRI and FIB-4. Discussed the pathophysiology, transmission, prevention, risks if left untreated, and treatment options with questions answered. Will check RNA level and liver function and fibrosis testing today as her last viral load is 68 year old. Message sent to her primary care team with need for change of atorvastatin to rosuvastatin and staying off pantoprazole due to medication interactions which could increase risk of treatment failure. Currently planning on treating with Mavyret for 8 weeks pending blood work results.

## 2019-04-12 ENCOUNTER — Telehealth: Payer: Self-pay | Admitting: Family

## 2019-04-12 LAB — LIVER FIBROSIS, FIBROTEST-ACTITEST
ALT: 18 U/L (ref 6–29)
Alpha-2-Macroglobulin: 317 mg/dL — ABNORMAL HIGH (ref 106–279)
Apolipoprotein A1: 156 mg/dL (ref 101–198)
Bilirubin: 0.3 mg/dL (ref 0.2–1.2)
Fibrosis Score: 0.35
GGT: 39 U/L (ref 3–65)
Haptoglobin: 165 mg/dL (ref 43–212)
Necroinflammat ACT Score: 0.07
Reference ID: 3143665

## 2019-04-12 LAB — HEPATITIS C RNA QUANTITATIVE
HCV Quantitative Log: 6.79 Log IU/mL — ABNORMAL HIGH
HCV RNA, PCR, QN: 6110000 IU/mL — ABNORMAL HIGH

## 2019-04-12 LAB — HEPATIC FUNCTION PANEL
AG Ratio: 1.3 (calc) (ref 1.0–2.5)
ALT: 17 U/L (ref 6–29)
AST: 20 U/L (ref 10–35)
Albumin: 4 g/dL (ref 3.6–5.1)
Alkaline phosphatase (APISO): 87 U/L (ref 37–153)
Bilirubin, Direct: 0.1 mg/dL (ref 0.0–0.2)
Globulin: 3.2 g/dL (calc) (ref 1.9–3.7)
Indirect Bilirubin: 0.2 mg/dL (calc) (ref 0.2–1.2)
Total Bilirubin: 0.3 mg/dL (ref 0.2–1.2)
Total Protein: 7.2 g/dL (ref 6.1–8.1)

## 2019-04-12 LAB — CBC
HCT: 31.4 % — ABNORMAL LOW (ref 35.0–45.0)
Hemoglobin: 10.2 g/dL — ABNORMAL LOW (ref 11.7–15.5)
MCH: 29.9 pg (ref 27.0–33.0)
MCHC: 32.5 g/dL (ref 32.0–36.0)
MCV: 92.1 fL (ref 80.0–100.0)
MPV: 10.2 fL (ref 7.5–12.5)
Platelets: 350 10*3/uL (ref 140–400)
RBC: 3.41 10*6/uL — ABNORMAL LOW (ref 3.80–5.10)
RDW: 15.2 % — ABNORMAL HIGH (ref 11.0–15.0)
WBC: 7.9 10*3/uL (ref 3.8–10.8)

## 2019-04-12 MED ORDER — ROSUVASTATIN CALCIUM 10 MG PO TABS: 10.0000 mg | ORAL_TABLET | Freq: Every day | ORAL | 2 refills | Status: DC

## 2019-04-12 MED ORDER — SOFOSBUVIR-VELPATASVIR 400-100 MG PO TABS: 1.0000 | ORAL_TABLET | Freq: Every day | ORAL | 2 refills | Status: DC

## 2019-04-12 NOTE — Telephone Encounter (Signed)
[name] has Genotype 2b Chronic Hepatitis C with a viral load of 6.11 million. APRI score is 0.143 and FIB-4 is 0.7 indicating low risk of fibrosis. Fibrotest results remain pending at present. Will plan for 12 weeks of Epclusa with medication taken following dialysis on treatment days. Have sent in new prescription for rosuvastatin 10 mg from atorvastatin due to medication interaction. Discussed plan of care and treatment goals with Ashley [name].

## 2019-04-13 NOTE — Telephone Encounter (Signed)
[facility] Patient Advocate Encounter   Received notification from [facility] that prior authorization for Ashley [name] is required.   PA submitted on 11/04 Key [id number] Status is pending- should know within 72 hours    [facility] will continue to follow.  Bartholomew Crews, CPhT Specialty Pharmacy Patient [facility] for Infectious Disease Phone: [phone] Fax: [phone] 04/13/2019 4:11 PM

## 2019-04-14 ENCOUNTER — Other Ambulatory Visit: Payer: Self-pay | Admitting: Internal Medicine

## 2019-04-15 ENCOUNTER — Encounter: Payer: Self-pay | Admitting: Pharmacy Technician

## 2019-04-15 ENCOUNTER — Telehealth: Payer: Self-pay | Admitting: Pharmacist

## 2019-04-15 MED FILL — SOFOSBUVIR-VELPATASVIR 400-: 400-100 | 28 days supply | Qty: 28 | Fill #0

## 2019-04-15 NOTE — Telephone Encounter (Signed)
[facility] Patient Advocate Encounter  Prior Authorization for [name] has been approved.    PA# [id number] Effective dates: 04/14/2019 through 07/06/2019  Patients co-pay is $0.00   [facility] will call the patient to counsel and set up first shipment of the medication.  Bartholomew Crews, CPhT Specialty Pharmacy Patient [facility] for Infectious Disease Phone: [phone] Fax: [phone] 04/15/2019 10:23 AM

## 2019-04-15 NOTE — Telephone Encounter (Signed)
Ashley [name] reached out to [facility] with questions about her [name] as well as some of her other medications. She reported she had forgotten to inform us that she was taking metoprolol succinate 25 mg daily, which I have added to her medication list. This medication does not interact with her [name] and she should continue to take this as prescribed. She also had a question when to stop her atorvastatin and start her rosuvastatin. She was counseled to stop her atorvastatin and start taking rosuvastatin right away. I also talked to her about her Eplcusa, reiterating the importance of adherence and that she needs to take this medication everyday with food. I informed her she may experience some of the common side effect such as headache or fatigue but these typically resolve as therapy is continued. All of her questions were answered and she will start her Epclusa when she receives it. She was encourage to give [place] a call if she experiences any issues or if she has any further questions.   Nicoletta Dress, PharmD PGY2 Infectious Disease Pharmacy Resident

## 2019-04-15 NOTE — Telephone Encounter (Signed)
Great, thanks

## 2019-05-11 MED FILL — SOFOSBUVIR-VELPATASVIR 400-: 400-100 | 28 days supply | Qty: 28 | Fill #1

## 2019-05-16 ENCOUNTER — Ambulatory Visit: Payer: Medicare Other | Admitting: Internal Medicine

## 2019-05-17 ENCOUNTER — Other Ambulatory Visit: Payer: Self-pay | Admitting: Pharmacist

## 2019-05-17 DIAGNOSIS — B182 Chronic viral hepatitis C: Secondary | ICD-10-CM

## 2019-05-17 DIAGNOSIS — Z79899 Other long term (current) drug therapy: Secondary | ICD-10-CM

## 2019-05-18 ENCOUNTER — Ambulatory Visit: Payer: Medicare Other | Admitting: Pharmacist

## 2019-05-26 ENCOUNTER — Telehealth: Payer: Self-pay | Admitting: Pharmacist

## 2019-05-26 NOTE — Telephone Encounter (Signed)
Patient missed her 4 week follow up for Hep C. Called to see how she was doing. She is doing well on Epclusa and states that she hasn't missed a single dose.  She is not having any side effects.  She has already started her 2nd month.  Will reschedule her to see me when she has completed therapy in February. Told her to keep doing a great job and finish out her full 3 months of treatment.

## 2019-06-15 MED FILL — SOFOSBUVIR-VELPATASVIR 400-: 400-100 | 28 days supply | Qty: 28 | Fill #2

## 2019-07-09 ENCOUNTER — Encounter (HOSPITAL_COMMUNITY): Payer: Self-pay | Admitting: Emergency Medicine

## 2019-07-09 ENCOUNTER — Observation Stay (HOSPITAL_COMMUNITY)
Admission: EM | Admit: 2019-07-09 | Discharge: 2019-07-11 | Disposition: A | Discharge status: Home / Self Care | Payer: Medicare Other | Attending: Internal Medicine | Admitting: Internal Medicine

## 2019-07-09 ENCOUNTER — Other Ambulatory Visit: Payer: Self-pay

## 2019-07-09 ENCOUNTER — Emergency Department (HOSPITAL_COMMUNITY): Payer: Medicare Other

## 2019-07-09 ENCOUNTER — Observation Stay (HOSPITAL_COMMUNITY): Payer: Medicare Other

## 2019-07-09 DIAGNOSIS — D631 Anemia in chronic kidney disease: Secondary | ICD-10-CM | POA: Diagnosis not present

## 2019-07-09 DIAGNOSIS — I10 Essential (primary) hypertension: Secondary | ICD-10-CM | POA: Diagnosis not present

## 2019-07-09 DIAGNOSIS — Z20822 Contact with and (suspected) exposure to covid-19: Secondary | ICD-10-CM | POA: Diagnosis not present

## 2019-07-09 DIAGNOSIS — Z8673 Personal history of transient ischemic attack (TIA), and cerebral infarction without residual deficits: Secondary | ICD-10-CM | POA: Diagnosis not present

## 2019-07-09 DIAGNOSIS — K8500 Idiopathic acute pancreatitis without necrosis or infection: Secondary | ICD-10-CM

## 2019-07-09 DIAGNOSIS — Z8711 Personal history of peptic ulcer disease: Secondary | ICD-10-CM | POA: Insufficient documentation

## 2019-07-09 DIAGNOSIS — N186 End stage renal disease: Secondary | ICD-10-CM | POA: Diagnosis not present

## 2019-07-09 DIAGNOSIS — F17210 Nicotine dependence, cigarettes, uncomplicated: Secondary | ICD-10-CM | POA: Diagnosis not present

## 2019-07-09 DIAGNOSIS — M1990 Unspecified osteoarthritis, unspecified site: Secondary | ICD-10-CM | POA: Insufficient documentation

## 2019-07-09 DIAGNOSIS — Z79899 Other long term (current) drug therapy: Secondary | ICD-10-CM | POA: Insufficient documentation

## 2019-07-09 DIAGNOSIS — Z886 Allergy status to analgesic agent status: Secondary | ICD-10-CM | POA: Diagnosis not present

## 2019-07-09 DIAGNOSIS — I120 Hypertensive chronic kidney disease with stage 5 chronic kidney disease or end stage renal disease: Secondary | ICD-10-CM | POA: Insufficient documentation

## 2019-07-09 DIAGNOSIS — F1910 Other psychoactive substance abuse, uncomplicated: Secondary | ICD-10-CM | POA: Diagnosis present

## 2019-07-09 DIAGNOSIS — N2581 Secondary hyperparathyroidism of renal origin: Secondary | ICD-10-CM | POA: Diagnosis not present

## 2019-07-09 DIAGNOSIS — K859 Acute pancreatitis without necrosis or infection, unspecified: Secondary | ICD-10-CM | POA: Diagnosis present

## 2019-07-09 DIAGNOSIS — B182 Chronic viral hepatitis C: Secondary | ICD-10-CM

## 2019-07-09 DIAGNOSIS — Z8719 Personal history of other diseases of the digestive system: Secondary | ICD-10-CM | POA: Diagnosis not present

## 2019-07-09 DIAGNOSIS — K858 Other acute pancreatitis without necrosis or infection: Secondary | ICD-10-CM | POA: Diagnosis not present

## 2019-07-09 DIAGNOSIS — E785 Hyperlipidemia, unspecified: Secondary | ICD-10-CM | POA: Diagnosis not present

## 2019-07-09 DIAGNOSIS — F1290 Cannabis use, unspecified, uncomplicated: Secondary | ICD-10-CM | POA: Diagnosis not present

## 2019-07-09 DIAGNOSIS — Z992 Dependence on renal dialysis: Secondary | ICD-10-CM

## 2019-07-09 DIAGNOSIS — I700 Atherosclerosis of aorta: Secondary | ICD-10-CM | POA: Diagnosis not present

## 2019-07-09 DIAGNOSIS — E8889 Other specified metabolic disorders: Secondary | ICD-10-CM | POA: Insufficient documentation

## 2019-07-09 DIAGNOSIS — B1920 Unspecified viral hepatitis C without hepatic coma: Secondary | ICD-10-CM | POA: Diagnosis present

## 2019-07-09 DIAGNOSIS — F17200 Nicotine dependence, unspecified, uncomplicated: Secondary | ICD-10-CM | POA: Diagnosis present

## 2019-07-09 HISTORY — DX: End stage renal disease: N18.6

## 2019-07-09 HISTORY — DX: End stage renal disease: Z99.2

## 2019-07-09 LAB — RESPIRATORY PANEL BY RT PCR (FLU A&B, COVID)
Influenza A by PCR: NEGATIVE
Influenza B by PCR: NEGATIVE
SARS Coronavirus 2 by RT PCR: NEGATIVE

## 2019-07-09 LAB — COMPREHENSIVE METABOLIC PANEL WITH GFR
ALT: 9 U/L (ref 0–44)
AST: 15 U/L (ref 15–41)
Albumin: 3.1 g/dL — ABNORMAL LOW (ref 3.5–5.0)
Alkaline Phosphatase: 53 U/L (ref 38–126)
Anion gap: 15 (ref 5–15)
BUN: 30 mg/dL — ABNORMAL HIGH (ref 8–23)
CO2: 28 mmol/L (ref 22–32)
Calcium: 9.6 mg/dL (ref 8.9–10.3)
Chloride: 92 mmol/L — ABNORMAL LOW (ref 98–111)
Creatinine, Ser: 9.3 mg/dL — ABNORMAL HIGH (ref 0.44–1.00)
GFR calc Af Amer: 4 mL/min — ABNORMAL LOW
GFR calc non Af Amer: 4 mL/min — ABNORMAL LOW
Glucose, Bld: 140 mg/dL — ABNORMAL HIGH (ref 70–99)
Potassium: 4.4 mmol/L (ref 3.5–5.1)
Sodium: 135 mmol/L (ref 135–145)
Total Bilirubin: 0.1 mg/dL — ABNORMAL LOW (ref 0.3–1.2)
Total Protein: 7.4 g/dL (ref 6.5–8.1)

## 2019-07-09 LAB — CBC WITH DIFFERENTIAL/PLATELET
Abs Immature Granulocytes: 0.02 10*3/uL (ref 0.00–0.07)
Basophils Absolute: 0 10*3/uL (ref 0.0–0.1)
Basophils Relative: 0 %
Eosinophils Absolute: 0.3 10*3/uL (ref 0.0–0.5)
Eosinophils Relative: 4 %
HCT: 40.4 % (ref 36.0–46.0)
Hemoglobin: 12.8 g/dL (ref 12.0–15.0)
Immature Granulocytes: 0 %
Lymphocytes Relative: 28 %
Lymphs Abs: 1.9 10*3/uL (ref 0.7–4.0)
MCH: 28 pg (ref 26.0–34.0)
MCHC: 31.7 g/dL (ref 30.0–36.0)
MCV: 88.4 fL (ref 80.0–100.0)
Monocytes Absolute: 0.7 10*3/uL (ref 0.1–1.0)
Monocytes Relative: 10 %
Neutro Abs: 3.9 10*3/uL (ref 1.7–7.7)
Neutrophils Relative %: 58 %
Platelets: 244 10*3/uL (ref 150–400)
RBC: 4.57 MIL/uL (ref 3.87–5.11)
RDW: 18.3 % — ABNORMAL HIGH (ref 11.5–15.5)
WBC: 6.8 10*3/uL (ref 4.0–10.5)
nRBC: 0 % (ref 0.0–0.2)

## 2019-07-09 LAB — LIPASE, BLOOD: Lipase: 64 U/L — ABNORMAL HIGH (ref 11–51)

## 2019-07-09 MED ORDER — CLONIDINE HCL 0.1 MG PO TABS
0.1000 mg | ORAL_TABLET | Freq: Two times a day (BID) | ORAL | Status: DC
  Administered 2019-07-09 – 2019-07-11 (×4): 0.1 mg via ORAL
  Filled 2019-07-09 (×4): qty 1

## 2019-07-09 MED ORDER — LACTATED RINGERS IV SOLN: INTRAVENOUS | Status: DC

## 2019-07-09 MED ORDER — SOFOSBUVIR-VELPATASVIR 400-100 MG PO TABS: 1.0000 | ORAL_TABLET | Freq: Every day | ORAL | Status: DC

## 2019-07-09 MED ORDER — HYDROXYZINE HCL 25 MG PO TABS: 25.0000 mg | ORAL_TABLET | Freq: Three times a day (TID) | ORAL | Status: DC | PRN

## 2019-07-09 MED ORDER — ONDANSETRON HCL 4 MG/2ML IJ SOLN
4.0000 mg | Freq: Once | INTRAMUSCULAR | Status: AC
  Administered 2019-07-09: 4 mg via INTRAVENOUS
  Filled 2019-07-09: qty 2

## 2019-07-09 MED ORDER — SODIUM CHLORIDE 0.9 % IV SOLN: 100.0000 mL | INTRAVENOUS | Status: DC | PRN

## 2019-07-09 MED ORDER — MORPHINE SULFATE (PF) 4 MG/ML IV SOLN
4.0000 mg | Freq: Once | INTRAVENOUS | Status: AC
  Administered 2019-07-09: 4 mg via INTRAVENOUS
  Filled 2019-07-09: qty 1

## 2019-07-09 MED ORDER — HEPARIN SODIUM (PORCINE) 1000 UNIT/ML DIALYSIS: 20.0000 [IU]/kg | INTRAMUSCULAR | Status: DC | PRN

## 2019-07-09 MED ORDER — LIDOCAINE HCL (PF) 1 % IJ SOLN: 5.0000 mL | INTRAMUSCULAR | Status: DC | PRN

## 2019-07-09 MED ORDER — ACETAMINOPHEN 325 MG PO TABS: 650.0000 mg | ORAL_TABLET | Freq: Four times a day (QID) | ORAL | Status: DC | PRN

## 2019-07-09 MED ORDER — SODIUM CHLORIDE 0.9 % IV SOLN: 62.5000 mg | INTRAVENOUS | Status: DC

## 2019-07-09 MED ORDER — ACETAMINOPHEN 650 MG RE SUPP: 650.0000 mg | Freq: Four times a day (QID) | RECTAL | Status: DC | PRN

## 2019-07-09 MED ORDER — RENA-VITE PO TABS
1.0000 | ORAL_TABLET | Freq: Every day | ORAL | Status: DC
  Administered 2019-07-10 – 2019-07-11 (×2): 1 via ORAL
  Filled 2019-07-09 (×2): qty 1

## 2019-07-09 MED ORDER — LIDOCAINE-PRILOCAINE 2.5-2.5 % EX CREA: 1.0000 "application " | TOPICAL_CREAM | CUTANEOUS | Status: DC | PRN

## 2019-07-09 MED ORDER — ONDANSETRON HCL 4 MG PO TABS: 4.0000 mg | ORAL_TABLET | Freq: Four times a day (QID) | ORAL | Status: DC | PRN

## 2019-07-09 MED ORDER — PENTAFLUOROPROP-TETRAFLUOROETH EX AERO: 1.0000 "application " | INHALATION_SPRAY | CUTANEOUS | Status: DC | PRN

## 2019-07-09 MED ORDER — AMLODIPINE BESYLATE 10 MG PO TABS
10.0000 mg | ORAL_TABLET | Freq: Every day | ORAL | Status: DC
  Administered 2019-07-10 – 2019-07-11 (×2): 10 mg via ORAL
  Filled 2019-07-09 (×2): qty 1

## 2019-07-09 MED ORDER — HEPARIN SODIUM (PORCINE) 1000 UNIT/ML DIALYSIS: 1000.0000 [IU] | INTRAMUSCULAR | Status: DC | PRN

## 2019-07-09 MED ORDER — CALCIUM CARBONATE ANTACID 500 MG PO CHEW: 500.0000 mg | CHEWABLE_TABLET | Freq: Four times a day (QID) | ORAL | Status: DC | PRN

## 2019-07-09 MED ORDER — SORBITOL 70 % SOLN
30.0000 mL | Status: DC | PRN
  Filled 2019-07-09: qty 30

## 2019-07-09 MED ORDER — ZOLPIDEM TARTRATE 5 MG PO TABS: 5.0000 mg | ORAL_TABLET | Freq: Every evening | ORAL | Status: DC | PRN

## 2019-07-09 MED ORDER — NEPRO/CARBSTEADY PO LIQD
237.0000 mL | Freq: Three times a day (TID) | ORAL | Status: DC | PRN
  Filled 2019-07-09: qty 237

## 2019-07-09 MED ORDER — FENTANYL CITRATE (PF) 100 MCG/2ML IJ SOLN
12.5000 ug | INTRAMUSCULAR | Status: DC | PRN
  Administered 2019-07-09 – 2019-07-11 (×11): 12.5 ug via INTRAVENOUS
  Filled 2019-07-09 (×11): qty 2

## 2019-07-09 MED ORDER — DOXERCALCIFEROL 4 MCG/2ML IV SOLN: 4.0000 ug | INTRAVENOUS | Status: DC

## 2019-07-09 MED ORDER — CAMPHOR-MENTHOL 0.5-0.5 % EX LOTN: 1.0000 "application " | TOPICAL_LOTION | Freq: Three times a day (TID) | CUTANEOUS | Status: DC | PRN

## 2019-07-09 MED ORDER — CHLORHEXIDINE GLUCONATE CLOTH 2 % EX PADS: 6.0000 | MEDICATED_PAD | Freq: Every day | CUTANEOUS | Status: DC

## 2019-07-09 MED ORDER — HEPARIN SODIUM (PORCINE) 5000 UNIT/ML IJ SOLN
5000.0000 [IU] | Freq: Three times a day (TID) | INTRAMUSCULAR | Status: DC
  Administered 2019-07-10: 5000 [IU] via SUBCUTANEOUS
  Filled 2019-07-09: qty 1

## 2019-07-09 MED ORDER — ALTEPLASE 2 MG IJ SOLR: 2.0000 mg | Freq: Once | INTRAMUSCULAR | Status: DC | PRN

## 2019-07-09 MED ORDER — METOPROLOL SUCCINATE ER 25 MG PO TB24
25.0000 mg | ORAL_TABLET | Freq: Every day | ORAL | Status: DC
  Administered 2019-07-10 – 2019-07-11 (×2): 25 mg via ORAL
  Filled 2019-07-09 (×2): qty 1

## 2019-07-09 MED ORDER — ONDANSETRON HCL 4 MG/2ML IJ SOLN: 4.0000 mg | Freq: Four times a day (QID) | INTRAMUSCULAR | Status: DC | PRN

## 2019-07-09 MED ORDER — NICOTINE 7 MG/24HR TD PT24
7.0000 mg | MEDICATED_PATCH | Freq: Every day | TRANSDERMAL | Status: DC
  Administered 2019-07-09 – 2019-07-11 (×3): 7 mg via TRANSDERMAL
  Filled 2019-07-09 (×3): qty 1

## 2019-07-09 MED ORDER — DOCUSATE SODIUM 283 MG RE ENEM
1.0000 | ENEMA | RECTAL | Status: DC | PRN
  Filled 2019-07-09: qty 1

## 2019-07-09 NOTE — ED Notes (Signed)
Ask patient for urine sample, Patient stated that she was a dialysis patient and produced very limited urine output.

## 2019-07-09 NOTE — Progress Notes (Signed)
Pt roomed to unit at 1825. Received a call from hemodialysis for pt to come and have a treatment. Report given to HD nurse

## 2019-07-09 NOTE — ED Provider Notes (Signed)
Medical screening examination/treatment/procedure(s) were conducted as a shared visit with non-physician practitioner(s) and myself.  I personally evaluated the patient during the encounter.    Patient has history of recurrent pancreatitis.  He has had 3 days of abdominal pain.  Associated nausea without vomiting.  Patient is alert and appropriate.  Mntal status is clear.  No respiratory distress.  I agree with plan of management.   Charlesetta Shanks, MD 07/09/19 [phone]

## 2019-07-09 NOTE — Progress Notes (Signed)
Patient returned back from dialysis. Report received from HD nurse.

## 2019-07-09 NOTE — ED Triage Notes (Signed)
To ED via [facility] from home -- c/o abd pain-- "I think it is my pancreatitis" denies etoh use "for years" -- dialysis T. TH, Sat pt-- fistula in left upper arm -- did not go to dialysis today.

## 2019-07-09 NOTE — H&P (Addendum)
History and Physical    HERO PREM [id number] DOB: [birth year] DOA: 07/09/2019  PCP: Sonia Side., FNP Consultants:  Elna Breslow- ID; Py[place]le - GI; [place] - nephrology Patient coming from:  Home - lives alone; NOK: Daughter, Robyne Peers, [phone]  Chief Complaint: Abdominal pain  HPI: Ashley [name] is a 69 y.o. female with medical history significant of CVA; polysubstance abuse; HTN; HLD; Hep C; ESRD on TTS HD; and recurrent pancreatitis presenting with abdominal pain.  She reports she has been doing her "regular everything, going to dialysis and back home."  For the last 3 times she has been here, it was related to her pancreas.  She noticed problems on Wednesday or Thursday.  She had upper abdominal pain anytime she tried to eat or drink  She vomited Thursday but none since.  She tried to eat yesterday and she had severe pain after.  She tried to drink coffee this morning "and it didn't act too good."  She didn't go to HD today due to pain.  She does not drink ETOH.  She has been on Hep C medication for 2 months and 3 weeks - this is the last week.  No fevers.  Her last BM was Wednesday.   ED Course:  H/o recurrent pancreatitis, drug user.  Abd pain with n/v with eating.  Lipase 64, CT abnormal.  COVID pending.  Missed HD today - nephrology will try to dialyze tonight.  Review of Systems: As per HPI; otherwise review of systems reviewed and negative.   Ambulatory Status:  Ambulates without assistance  Past Medical History:  Diagnosis Date  . Acute pancreatitis 12/2018  . Arthritis   . Cervical radiculopathy 02/28/2011  . Cocaine substance abuse ([place]) 05/26/2013   positive UDS   . ESRD on hemodialysis (HCC)    TTS  . Gastropathy   . GERD (gastroesophageal reflux disease)   . Hepatitis C 1987   history of IVDA  . Hiatal hernia   . Hyperlipidemia   . Hypertension   . Marijuana abuse 12/18/204   positive UDS, family members smoke as well  . Pancreatitis 2000    resolved  . 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 Schatzki's ring   . Stroke ([place])   . Ulcer 1990   gastric ulcer. Ruptured s/p emergency repair    Past Surgical History:  Procedure Laterality Date  . ABDOMINAL HYSTERECTOMY  1979  . AV FISTULA PLACEMENT Left 06/16/2017   Procedure: ARTERIOVENOUS (AV) FISTULA CREATION LEFT ARM;  Surgeon: Conrad Cache, MD;  Location: [place];  Service: Vascular;  Laterality: Left;  . BASCILIC VEIN TRANSPOSITION Left 10/02/2017   Procedure: BASILIC VEIN TRANSPOSITION SECOND STAGE LEFT ARM;  Surgeon: Rosetta Posner, MD;  [facility];  Service: Vascular;  Laterality: Left;  . ESOPHAGOGASTRODUODENOSCOPY N/A 05/29/2013   Procedure: ESOPHAGOGASTRODUODENOSCOPY (EGD);  Surgeon: Jerene Bears, MD;  Location: [facility] ENDOSCOPY;  Service: Endoscopy;  Laterality: N/A;  . EXCHANGE OF A DIALYSIS CATHETER Left 07/31/2017   Procedure: Removal  OF A  Right GroinTUNNELED  DIALYSIS CATHETER ,  Insertion of Left Femoral Dialysis Catheter.;  Surgeon: Rosetta Posner, MD;  Location: [place];  Service: Vascular;  Laterality: Left;  . INSERTION OF DIALYSIS CATHETER Right 06/16/2017   Procedure: INSERTION OF DIALYSIS CATHETER;  Surgeon: Conrad Indianola, MD;  Location: [place];  Service: Vascular;  Laterality: Right;  . IR AV DIALY SHUNT INTRO [place] W/PTA/IMG LEFT  06/21/2018  . REPAIR OF  PERFORATED ULCER      Social History   Socioeconomic History  . Marital status: Widowed    Spouse name: Not on file  . Number of children: 3  . Years of education: Not on file  . Highest education level: Not on file  Occupational History  . Occupation: retired  Tobacco Use  . Smoking status: Current Every Day Smoker    Packs/day: 0.25    Years: 40.00    Pack years: 10.00    Types: Cigarettes  . Smokeless tobacco: Never Used  Substance and Sexual Activity  . Alcohol use: No    Alcohol/week: 0.0 standard drinks  . Drug use: Not Currently    Types: Heroin, Marijuana, Cocaine     Comment: hasn't used cocaine in 1-2 years; she smokes marijuana daily, "whenever I can get it"  . Sexual activity: Never  Other Topics Concern  . Not on file  Social History Narrative  . Not on file   Social Determinants of Health   Financial Resource Strain:   . Difficulty of Paying Living Expenses: Not on file  Food Insecurity:   . Worried About Charity fundraiser in the Last Year: Not on file  . Ran Out of Food in the Last Year: Not on file  Transportation Needs:   . Lack of Transportation (Medical): Not on file  . Lack of Transportation (Non-Medical): Not on file  Physical Activity:   . Days of Exercise per Week: Not on file  . Minutes of Exercise per Session: Not on file  Stress:   . Feeling of Stress : Not on file  Social Connections:   . Frequency of Communication with Friends and Family: Not on file  . Frequency of Social Gatherings with Friends and Family: Not on file  . Attends Religious Services: Not on file  . Active Member of Clubs or Organizations: Not on file  . Attends Archivist Meetings: Not on file  . Marital Status: Not on file  Intimate Partner Violence:   . Fear of Current or Ex-Partner: Not on file  . Emotionally Abused: Not on file  . Physically Abused: Not on file  . Sexually Abused: Not on file    Allergies  Allergen Reactions  . Aspirin Nausea And Vomiting    Stomach ache  . Ibuprofen Nausea And Vomiting    Stomach ache    Family History  Problem Relation Age of Onset  . Hypertension Father   . Cancer Father   . Hyperlipidemia Father   . Seizures Sister   . Early death Daughter   . Kidney disease Daughter        end stage dialysis dependent     Prior to Admission medications   Medication Sig Start Date End Date Taking? Authorizing Provider  amLODipine (NORVASC) 10 MG tablet Take 1 tablet (10 mg total) by mouth daily. 11/11/16  Yes Phelps, Aviva Signs Y, DO  B Complex-C-Zn-Folic Acid (DIALYVITE [id number] WITH ZINC) 0.8 MG TABS Take  1 tablet by mouth daily. 07/07/19  Yes [provider]  cloNIDine (CATAPRES) 0.1 MG tablet Take 0.1 mg by mouth 2 (two) times daily. 06/25/19  Yes [provider]  metoprolol succinate (TOPROL-XL) 25 MG 24 hr tablet Take 25 mg by mouth daily.   Yes [provider]  rosuvastatin (CRESTOR) 10 MG tablet Take 1 tablet (10 mg total) by mouth daily. 04/12/19  Yes Golden Circle, FNP  hydrALAZINE (APRESOLINE) 50 MG tablet Take 1 tablet (50  mg total) by mouth every 8 (eight) hours. Patient not taking: Reported on 07/09/2019 02/04/19   Ladell Pier, MD  Sofosbuvir-Velpatasvir (EPCLUSA) 400-100 MG TABS Take 1 tablet by mouth daily. Take 1 tablet by mouth daily. Patient not taking: Reported on 07/09/2019 04/12/19   Golden Circle, FNP    Physical Exam: Vitals:   07/09/19 1545 07/09/19 1600 07/09/19 1615 07/09/19 1630  BP: (!) 160/76  (!) 165/71 (!) 175/72  Pulse: (!) 52 (!) 49 (!) 56 (!) 56  Resp: 12 15 13 13   Temp:      TempSrc:      SpO2: 100% 100% 100% 99%  Weight:      Height:         . General:  Appears calm and comfortable and is NAD . Eyes:  PERRL, EOMI, normal lids, iris . ENT:  grossly normal hearing, lips & tongue, mmm; poor dentition . Neck:  no LAD, masses or thyromegaly . Cardiovascular:  RRR, no m/r/g. No LE edema.  Marland Kitchen Respiratory:   CTA bilaterally with no wheezes/rales/rhonchi.  Normal respiratory effort. . Abdomen:  soft, mildly diffiusely TTP, ND, NABS . Skin:  no rash or induration seen on limited exam . Musculoskeletal:  grossly normal tone BUE/BLE, good ROM, no bony abnormality . Psychiatric:  grossly normal mood and affect, speech fluent and appropriate, AOx3 . Neurologic:  CN 2-12 grossly intact, moves all extremities in coordinated fashion    Radiological Exams on Admission: CT ABDOMEN PELVIS WO CONTRAST  Result Date: 07/09/2019 CLINICAL DATA:  Abdominal pain and vomiting. Elevated lipase and history of pancreatitis. EXAM: CT  ABDOMEN AND PELVIS WITHOUT CONTRAST TECHNIQUE: Multidetector CT imaging of the abdomen and pelvis was performed following the standard protocol without IV contrast. COMPARISON:  03/01/2019 FINDINGS: Lower chest: Stable bibasilar scarring. Hepatobiliary: No focal liver abnormality is seen. No gallstones, gallbladder wall thickening, or biliary dilatation. Pancreas: Peripancreatic inflammation present around the head and body of the pancreas. No abnormal fluid collections. No pancreatic calcifications or significant pancreatic ductal dilatation. Spleen: Normal in size without focal abnormality. Adrenals/Urinary Tract: Adrenal glands are unremarkable. Kidneys are normal, without renal calculi, focal lesion, or hydronephrosis. Bladder is decompressed. Stomach/Bowel: Bowel shows no evidence of obstruction, ileus, inflammation or lesion. The appendix is normal. No free air identified. Vascular/Lymphatic: Stable densely calcified plaque in the abdominal aorta and iliac arteries without evidence of aneurysmal disease. Reproductive: Status post hysterectomy. No adnexal masses. Other: No abdominal wall hernia or abnormality. No abdominopelvic ascites. Musculoskeletal: No acute or significant osseous findings. IMPRESSION: Peripancreatic inflammation around the head and body of the pancreas. Findings are consistent with acute pancreatitis. Electronically Signed   By: Aletta Edouard M.D.   On: 07/09/2019 12:17   DG Chest Portable 1 View  Result Date: 07/09/2019 CLINICAL DATA:  Chest and upper abdominal pain. Shortness of breath. EXAM: PORTABLE CHEST 1 VIEW COMPARISON:  02/15/2019 FINDINGS: Numerous leads and wires project over the chest. Left upper extremity vascular stent. Midline trachea. Normal heart size. Tortuous thoracic aorta. Atherosclerosis in the transverse aorta. No pleural effusion or pneumothorax. Clear lungs. IMPRESSION: No acute cardiopulmonary disease. Aortic Atherosclerosis (ICD10-I70.0). Electronically  Signed   By: Abigail Miyamoto M.D.   On: 07/09/2019 10:10    EKG: not done   Labs on Admission: I have personally reviewed the available labs and imaging studies at the time of the admission.  Pertinent labs:   Glucose 140 BUN 30/Creatinine 9.30/GFR 4 Albumin 3.1 Lipase 64 Normal CBC HCV quant 6,110,000  Assessment/Plan Principal Problem:   Acute pancreatitis Active Problems:   Hepatitis C   Dyslipidemia   Hypertension   Polysubstance abuse ([place])   Tobacco dependence    Recurrent acute pancreatitis -Patient with prior h/o acute idiopathic pancreatitis presenting with similar symptoms -She initially presented with abdominal pain on 5/7; she was discharged on 5/9 and d/c summary noted that she had acute intractable abdominal pain with poor PO intake and n/v; she had elevated lipase.  Pancreatitis was not clearly confirmed during that visit. -She returned on 7/23 with PE suggestive of acute pancreatitis.  She was hospitalized through 7/28 with elevated lipase and CT findings confirming acute pancreatitis.  This was idiopathic in nature and GI was consulted. -She followed up with [facility] on 8/21 and saw Dr. Hilarie Fredrickson and PA Zehr.  She was scheduled for MRI/MRCP and there was discussion about need for a possible EUS.  She does not appear to have had MRI/MRCP performed. -She returned to the ER on 9/21 and was again diagnosed with acute pancreatitis, but it was mild enough that she was discharged to home. -She now presents with abdominal pain with eating but not really n/v, unremarkable lipase, pancreatitis findings on CT -Overall, this appears to be very mild pancreatitis -Will observe for now -Strict NPO for now but advance diet as tolerated -Aggressive IVF hydration is generally indicated but is not appropriate due to ESRD; will hydrate with 50 cc/hr x 10 hours -Pain control with fentanyl 12.5 mg q2h prn.   -Nausea control with Zofran -Will order MRI/MRCP for further  evaluation -Await results of MRI and clinical course to determine if GI consult is needed.  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 million -She was started on Epclusa on 11/3 and is starting her last week of treatment this week for a total of 3 months -In review of side effects, pancreatitis is not listed and this was confirmed with pharmacy -She was changed from atorvastatin to rosuvastatin due to medication interaction prior to treatment with [name] -Will continue Epclusa for now  Polysubstance abuse -Patient acknowledges daily marijuana use, ongoing -Her last UDS that was + for cocaine was in 2019 -Cessation encouraged; this should be encouraged on an ongoing basis -UDS ordered  HTN -Continue Norvasc, Clonidine, Toprol XL -She reports that she has chosen to no longer take hydralazine so will hold for now  HLD -As noted above, she was changed from Lipitor to Crestor to accommodate Epclusa -However, both statins are associated with an increased risk for pancreatitis -Will hold Crestor for now  Tobacco dependence -Tobacco Dependence: encourage cessation.   -This was discussed with the patient and should be reviewed on an ongoing basis.   -Patch ordered at patient request.   Note: This patient has been tested and is negative for the novel coronavirus COVID-19.  DVT prophylaxis: Heparin Code Status:  Full - confirmed with patient Family Communication: None present; she did not desire that I contact her family at the time of admission  Disposition Plan:  Home once clinically improved Consults called: Nephrology  Admission status: It is my clinical opinion that referral for OBSERVATION is reasonable and  necessary in this patient based  on the above information provided. The aforementioned taken together are felt to place the patient at high risk for further clinical deterioration. However it is anticipated that the patient may be medically stable for discharge from the hospital within 24 to 48 hours.    Karmen Bongo MD [facility]   How to contact the [facility] Attending or Consulting provider [place] or covering provider during after hours [place], for this patient?  1. Check the care team in [facility] and look for a) attending/consulting [facility] provider listed and b) the [facility] team listed 2. Log into [email or web address] and use [place]'s universal password to access. If you do not have the password, please contact the hospital operator. 3. Locate the [facility] provider you are looking for under [facility] and page to a number that you can be directly reached. 4. If you still have difficulty reaching the provider, please page the [facility] (Director on Call) for the Hospitalists listed on amion for assistance.   07/09/2019, 4:46 PM

## 2019-07-09 NOTE — Consult Note (Signed)
[facility] Renal Consultation Note    Indication for Consultation:  Management of ESRD/hemodialysis; anemia, hypertension/volume and secondary hyperparathyroidism  HPI: Ashley [name] is a 69 y.o. female with ESRD on TTS dialysis at [facility] with a hx of CVA, HTN, HLD, Hep C, drug abuse, hx recurrent pancreatitis,  presents with recurrent abdominal pain that started Wednesday or Thursday.  She denies ETOH but does think she may have been eating some high fat foods.   Last HD 1/28. Lipase is 64 LFT wnl. K 4.4 WBC 6.8 hgb 12.8. Abdominal ST showed peripancreatic inflammation around the head and body of the pancreas.  She is admitted for further pain management and need for dialysis. She is afebrile. BP is in usual range - EDW is 62.5 - weight in computer says 74.4  She vomited Thursday but not since. Pain is epigastric.  No SOB, CP, fever, chills. She thinks she may be constipated.  She has been taking Epclusa for Hep C and has about 5 days to take and has no problems with this med.   Past Medical History:  Diagnosis Date  . Acute pancreatitis 12/2018  . Arthritis   . Cervical radiculopathy 02/28/2011  . Chronic renal failure   . Cocaine substance abuse ([place]) 05/26/2013   positive UDS   . Gastropathy   . GERD (gastroesophageal reflux disease)   . Hepatitis C 1987   history of IVDA  . Hiatal hernia   . Hyperlipidemia   . Hypertension   . Marijuana abuse 12/18/204   positive UDS, family members smoke as well  . Pancreatitis 2000   resolved  . 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 Schatzki's ring   . Stroke ([place])   . Ulcer 1990   gastric ulcer. Ruptured s/p emergency repair   Past Surgical History:  Procedure Laterality Date  . ABDOMINAL HYSTERECTOMY  1979  . AV FISTULA PLACEMENT Left 06/16/2017   Procedure: ARTERIOVENOUS (AV) FISTULA CREATION LEFT ARM;  Surgeon: Conrad Ponder, MD;  Location: [place];  Service: Vascular;  Laterality: Left;  . BASCILIC VEIN TRANSPOSITION  Left 10/02/2017   Procedure: BASILIC VEIN TRANSPOSITION SECOND STAGE LEFT ARM;  Surgeon: Rosetta Posner, MD;  Location: [facility] OR;  Service: Vascular;  Laterality: Left;  . ESOPHAGOGASTRODUODENOSCOPY N/A 05/29/2013   Procedure: ESOPHAGOGASTRODUODENOSCOPY (EGD);  Surgeon: Jerene Bears, MD;  Location: [facility] ENDOSCOPY;  Service: Endoscopy;  Laterality: N/A;  . EXCHANGE OF A DIALYSIS CATHETER Left 07/31/2017   Procedure: Removal  OF A  Right GroinTUNNELED  DIALYSIS CATHETER ,  Insertion of Left Femoral Dialysis Catheter.;  Surgeon: Rosetta Posner, MD;  Location: [place];  Service: Vascular;  Laterality: Left;  . INSERTION OF DIALYSIS CATHETER Right 06/16/2017   Procedure: INSERTION OF DIALYSIS CATHETER;  Surgeon: Conrad [place], MD;  Location: [place];  Service: Vascular;  Laterality: Right;  . IR AV DIALY SHUNT INTRO [place] W/PTA/IMG LEFT  06/21/2018  . REPAIR OF PERFORATED ULCER     Family History  Problem Relation Age of Onset  . Hypertension Father   . Cancer Father   . Hyperlipidemia Father   . Seizures Sister   . Early death Daughter   . Kidney disease Daughter        end stage dialysis dependent    Social History:  reports that she has been smoking cigarettes. She has a 10.00 pack-year smoking history. She has never used smokeless tobacco. She reports previous drug use. Drugs: Heroin, Marijuana, and Cocaine.  She reports that she does not drink alcohol. Allergies  Allergen Reactions  . Aspirin Nausea And Vomiting    Stomach ache  . Ibuprofen Nausea And Vomiting    Stomach ache   Prior to Admission medications   Medication Sig Start Date End Date Taking? Authorizing Provider  amLODipine (NORVASC) 10 MG tablet Take 1 tablet (10 mg total) by mouth daily. 11/11/16  Yes Phelps, Aviva Signs Y, DO  B Complex-C-Zn-Folic Acid (DIALYVITE Q000111Q WITH ZINC) 0.8 MG TABS Take 1 tablet by mouth daily. 07/07/19  Yes [provider]  cloNIDine (CATAPRES) 0.1 MG tablet Take 0.1 mg by mouth 2 (two)  times daily. 06/25/19  Yes [provider]  metoprolol succinate (TOPROL-XL) 25 MG 24 hr tablet Take 25 mg by mouth daily.   Yes [provider]  rosuvastatin (CRESTOR) 10 MG tablet Take 1 tablet (10 mg total) by mouth daily. 04/12/19  Yes Golden Circle, FNP  hydrALAZINE (APRESOLINE) 50 MG tablet Take 1 tablet (50 mg total) by mouth every 8 (eight) hours. Patient not taking: Reported on 07/09/2019 02/04/19   Ladell Pier, MD  Sofosbuvir-Velpatasvir (EPCLUSA) 400-100 MG TABS Take 1 tablet by mouth daily. Take 1 tablet by mouth daily. Patient not taking: Reported on 07/09/2019 04/12/19   Golden Circle, FNP   No current facility-administered medications for this encounter.   Current Outpatient Medications  Medication Sig Dispense Refill  . amLODipine (NORVASC) 10 MG tablet Take 1 tablet (10 mg total) by mouth daily. 90 tablet 1  . B Complex-C-Zn-Folic Acid (DIALYVITE Q000111Q WITH ZINC) 0.8 MG TABS Take 1 tablet by mouth daily.    . cloNIDine (CATAPRES) 0.1 MG tablet Take 0.1 mg by mouth 2 (two) times daily.    . metoprolol succinate (TOPROL-XL) 25 MG 24 hr tablet Take 25 mg by mouth daily.    . rosuvastatin (CRESTOR) 10 MG tablet Take 1 tablet (10 mg total) by mouth daily. 30 tablet 2  . hydrALAZINE (APRESOLINE) 50 MG tablet Take 1 tablet (50 mg total) by mouth every 8 (eight) hours. (Patient not taking: Reported on 07/09/2019) 90 tablet 3  . Sofosbuvir-Velpatasvir (EPCLUSA) 400-100 MG TABS Take 1 tablet by mouth daily. Take 1 tablet by mouth daily. (Patient not taking: Reported on 07/09/2019) 28 tablet 2   Labs: Basic Metabolic Panel: Recent Labs  Lab 07/09/19 0940  NA 135  K 4.4  CL 92*  CO2 28  GLUCOSE 140*  BUN 30*  CREATININE 9.30*  CALCIUM 9.6   Liver Function Tests: Recent Labs  Lab 07/09/19 0940  AST 15  ALT 9  ALKPHOS 53  BILITOT 0.1*  PROT 7.4  ALBUMIN 3.1*   Recent Labs  Lab 07/09/19 0940  LIPASE 64*   CBC: Recent Labs  Lab  07/09/19 0940  WBC 6.8  NEUTROABS 3.9  HGB 12.8  HCT 40.4  MCV 88.4  PLT 244  Studies/Results: CT ABDOMEN PELVIS WO CONTRAST  Result Date: 07/09/2019 CLINICAL DATA:  Abdominal pain and vomiting. Elevated lipase and history of pancreatitis. EXAM: CT ABDOMEN AND PELVIS WITHOUT CONTRAST TECHNIQUE: Multidetector CT imaging of the abdomen and pelvis was performed following the standard protocol without IV contrast. COMPARISON:  03/01/2019 FINDINGS: Lower chest: Stable bibasilar scarring. Hepatobiliary: No focal liver abnormality is seen. No gallstones, gallbladder wall thickening, or biliary dilatation. Pancreas: Peripancreatic inflammation present around the head and body of the pancreas. No abnormal fluid collections. No pancreatic calcifications or significant pancreatic ductal dilatation. Spleen: Normal in size without focal abnormality.  Adrenals/Urinary Tract: Adrenal glands are unremarkable. Kidneys are normal, without renal calculi, focal lesion, or hydronephrosis. Bladder is decompressed. Stomach/Bowel: Bowel shows no evidence of obstruction, ileus, inflammation or lesion. The appendix is normal. No free air identified. Vascular/Lymphatic: Stable densely calcified plaque in the abdominal aorta and iliac arteries without evidence of aneurysmal disease. Reproductive: Status post hysterectomy. No adnexal masses. Other: No abdominal wall hernia or abnormality. No abdominopelvic ascites. Musculoskeletal: No acute or significant osseous findings. IMPRESSION: Peripancreatic inflammation around the head and body of the pancreas. Findings are consistent with acute pancreatitis. Electronically Signed   By: Aletta Edouard M.D.   On: 07/09/2019 12:17   DG Chest Portable 1 View  Result Date: 07/09/2019 CLINICAL DATA:  Chest and upper abdominal pain. Shortness of breath. EXAM: PORTABLE CHEST 1 VIEW COMPARISON:  02/15/2019 FINDINGS: Numerous leads and wires project over the chest. Left upper extremity vascular  stent. Midline trachea. Normal heart size. Tortuous thoracic aorta. Atherosclerosis in the transverse aorta. No pleural effusion or pneumothorax. Clear lungs. IMPRESSION: No acute cardiopulmonary disease. Aortic Atherosclerosis (ICD10-I70.0). Electronically Signed   By: Abigail Miyamoto M.D.   On: 07/09/2019 10:10    ROS: As per HPI otherwise negative.  Physical Exam: Vitals:   07/09/19 0930 07/09/19 0937 07/09/19 0939 07/09/19 1004  BP:  (!) 158/72    Pulse:  (!) 59    Resp:  16    Temp:  98.6 F (37 C)    TempSrc:  Oral    SpO2: 96% 95%    Weight:   66.7 kg 74.4 kg  Height:    5\' 2"  (1.575 m)     General: WDWN NAD  Head: NCAT sclera not icteric MMM Neck: Supple.  Lungs: CTA bilaterally without wheezes, rales, or rhonchi. Breathing is unlabored. Heart: RRR with S1 S2.  Abdomen: soft sig epigastric tenderness+ BS Lower extremities:without edema or ischemic changes, no open wounds  Neuro: A & O  X 3. Moves all extremities spontaneously. Psych:  Responds to questions appropriately with a normal affect. Dialysis Access: left upper AVF + bruit  Dialysis Orders: TTS [facility] 160 Optiflux 3.5 hours EDW 62.5 2 K 2.25 Ca profile 4 heparin 2000 with 1000 mid tm left upper AVF Recent labs: hgb 1.25 - up from 10.4 - last Mircera 59 q 2 last 1/21  and venofer 50/week on Tuesdays iPTH 614 Hectorol 4 Aurixya 1 ac sensipar 30/day Current BP meds amlodipine 10 and hydral 100 bid clonidine 0.1 bid  Assessment/Plan: 1. Recurrent pancreatitis -per primary - not sure if high fat foods are what is really setting it off - but can aggravate - discussed with her 2. ESRD -  TTS - HD today per routine K 4.4 pending COVID clearance 3. Hypertension/volume  - post BP average 150/80 recently getting to 62.1 below edw - lowest BP during tmts 110s/60s- needs accurate weight pre HD and UF to EDW as tolerated. CXR neg 4. Anemia  - hgb 12.8 - no ESA for now - continue weekly Fe - due on Tuesdays 5. Metabolic bone disease  -  Continue hectorol/binders when taking solids 6. Nutrition - NPO > CL per primary  7. Hx Hep C on Epclusa since 04/12/19 - 12 week course  8. Hx CVA 9. Hx substance abuse  Myriam Jacobson, PA-C [facility] [place] [phone] 07/09/2019, 2:51 PM
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
## 2019-07-10 DIAGNOSIS — Z992 Dependence on renal dialysis: Secondary | ICD-10-CM | POA: Diagnosis not present

## 2019-07-10 DIAGNOSIS — K8500 Idiopathic acute pancreatitis without necrosis or infection: Secondary | ICD-10-CM | POA: Diagnosis not present

## 2019-07-10 DIAGNOSIS — F17200 Nicotine dependence, unspecified, uncomplicated: Secondary | ICD-10-CM | POA: Diagnosis not present

## 2019-07-10 DIAGNOSIS — B182 Chronic viral hepatitis C: Secondary | ICD-10-CM | POA: Diagnosis not present

## 2019-07-10 LAB — COMPREHENSIVE METABOLIC PANEL
ALT: 10 U/L (ref 0–44)
AST: 14 U/L — ABNORMAL LOW (ref 15–41)
Albumin: 2.8 g/dL — ABNORMAL LOW (ref 3.5–5.0)
Alkaline Phosphatase: 47 U/L (ref 38–126)
Anion gap: 12 (ref 5–15)
BUN: 16 mg/dL (ref 8–23)
CO2: 28 mmol/L (ref 22–32)
Calcium: 9 mg/dL (ref 8.9–10.3)
Chloride: 99 mmol/L (ref 98–111)
Creatinine, Ser: 6.66 mg/dL — ABNORMAL HIGH (ref 0.44–1.00)
GFR calc Af Amer: 7 mL/min — ABNORMAL LOW (ref >60)
GFR calc non Af Amer: 6 mL/min — ABNORMAL LOW (ref >60)
Glucose, Bld: 70 mg/dL (ref 70–99)
Potassium: 4.4 mmol/L (ref 3.5–5.1)
Sodium: 139 mmol/L (ref 135–145)
Total Bilirubin: 0.6 mg/dL (ref 0.3–1.2)
Total Protein: 6.9 g/dL (ref 6.5–8.1)

## 2019-07-10 LAB — CBC
HCT: 38 % (ref 36.0–46.0)
Hemoglobin: 12.3 g/dL (ref 12.0–15.0)
MCH: 28.6 pg (ref 26.0–34.0)
MCHC: 32.4 g/dL (ref 30.0–36.0)
MCV: 88.4 fL (ref 80.0–100.0)
Platelets: 186 10*3/uL (ref 150–400)
RBC: 4.3 MIL/uL (ref 3.87–5.11)
RDW: 17.6 % — ABNORMAL HIGH (ref 11.5–15.5)
WBC: 6.5 10*3/uL (ref 4.0–10.5)
nRBC: 0 % (ref 0.0–0.2)

## 2019-07-10 NOTE — Progress Notes (Addendum)
[facility] Progress Note   Dialysis Orders: TTS [place] 160 Optiflux 3.5 hours EDW 62.5 2 K 2.25 Ca profile 4 heparin 2000 with 1000 mid tm left upper AVF Recent labs: hgb 1.25 - up from 10.4 - last Mircera 59 q 2 last 1/21  and venofer 50/week on Tuesdays iPTH 614 Hectorol 4 Aurixya 1 ac sensipar 30/day Current BP meds amlodipine 10 and hydral 100 bid clonidine 0.1 bid-- also on MTP 25 succ - not on dialysis med list - needs updating at d/c   Assessment/Plan: 1. Recurrent pancreatitis -per primary - not sure if high fat foods are what is really setting it off - but can aggravate - discussed with her. For dc Monday am per PMD notes.  2. ESRD -  TTS - next HD Tuesday - tolerated HD without problems Saturday 3. Hypertension/volume  - post BP average 150/80 recently getting to 62.1 below edw - lowest BP during tmts 110s/60s- needs accurate weight pre HD and UF to EDW as tolerated. CXR neg net UF 500 yesterday with post wt 61.3 - BP still up - suspect EDW may need to go down a tad more  4. Anemia  - hgb 12.8 - no ESA for now - continue weekly Fe - due on Tuesdays 5. Metabolic bone disease -  Continue hectorol/ add binders when taking solids 6. Nutrition - NPO > CL per primary  7. Hx Hep C on Epclusa since 04/12/19 - 12 week course  8. Hx CVA 9. Hx substance abuse  Myriam Jacobson, PA-C [facility] Beeper [phone] 07/10/2019,8:08 AM  LOS: 0 days    Pt seen, examined and agree w assess/plan as above with additions as indicated.  [facility] 07/10/2019, 5:14 PM     Subjective:   Lives alone - has daughter in town whom she sees but called EMS herself to come to the hospital. Hasn't had anything to eat and not eating much PTA since abdominal pain started   Objective Vitals:   07/09/19 2200 07/09/19 2249 07/09/19 2319 07/10/19 0505  BP: (!) 164/86 (!) 164/88 (!) 162/89 (!) 156/69  Pulse: 74 75 73 63  Resp:    18  Temp:  98.4 F (36.9  C)  98.5 F (36.9 C)  TempSrc:  Oral  Oral  SpO2:  99% 98% 91%  Weight:  61.3 kg    Height:       Physical Exam General: NAD breathing easily on room air Heart: RRR Lungs: no rales Abdomen: soft with epigastric tenderness though not as much as on exam yesterday Extremities: no LE edema Dialysis Access:  Left upper AVF + bruit   Additional Objective Labs: Basic Metabolic Panel: Recent Labs  Lab 07/09/19 0940 07/10/19 0354  NA 135 139  K 4.4 4.4  CL 92* 99  CO2 28 28  GLUCOSE 140* 70  BUN 30* 16  CREATININE 9.30* 6.66*  CALCIUM 9.6 9.0   Liver Function Tests: Recent Labs  Lab 07/09/19 0940 07/10/19 0354  AST 15 14*  ALT 9 10  ALKPHOS 53 47  BILITOT 0.1* 0.6  PROT 7.4 6.9  ALBUMIN 3.1* 2.8*   Recent Labs  Lab 07/09/19 0940  LIPASE 64*   CBC: Recent Labs  Lab 07/09/19 0940 07/10/19 0354  WBC 6.8 6.5  NEUTROABS 3.9  --   HGB 12.8 12.3  HCT 40.4 38.0  MCV 88.4 88.4  PLT 244 186   Blood Culture    Component Value  Date/Time   SDES URINE, RANDOM 06/20/2017 2222   SPECREQUEST NONE 06/20/2017 2222   CULT MULTIPLE SPECIES PRESENT, SUGGEST RECOLLECTION (A) 06/20/2017 2222   REPTSTATUS 06/22/2017 FINAL 06/20/2017 2222    Cardiac Enzymes: No results for input(s): CKTOTAL, CKMB, CKMBINDEX, TROPONINI in the last 168 hours. CBG: No results for input(s): GLUCAP in the last 168 hours. Iron Studies: No results for input(s): IRON, TIBC, TRANSFERRIN, FERRITIN in the last 72 hours. Lab Results  Component Value Date   INR 1.0 04/28/2018   INR 1.05 06/13/2017   Studies/Results: CT ABDOMEN PELVIS WO CONTRAST  Result Date: 07/09/2019 CLINICAL DATA:  Abdominal pain and vomiting. Elevated lipase and history of pancreatitis. EXAM: CT ABDOMEN AND PELVIS WITHOUT CONTRAST TECHNIQUE: Multidetector CT imaging of the abdomen and pelvis was performed following the standard protocol without IV contrast. COMPARISON:  03/01/2019 FINDINGS: Lower chest: Stable bibasilar  scarring. Hepatobiliary: No focal liver abnormality is seen. No gallstones, gallbladder wall thickening, or biliary dilatation. Pancreas: Peripancreatic inflammation present around the head and body of the pancreas. No abnormal fluid collections. No pancreatic calcifications or significant pancreatic ductal dilatation. Spleen: Normal in size without focal abnormality. Adrenals/Urinary Tract: Adrenal glands are unremarkable. Kidneys are normal, without renal calculi, focal lesion, or hydronephrosis. Bladder is decompressed. Stomach/Bowel: Bowel shows no evidence of obstruction, ileus, inflammation or lesion. The appendix is normal. No free air identified. Vascular/Lymphatic: Stable densely calcified plaque in the abdominal aorta and iliac arteries without evidence of aneurysmal disease. Reproductive: Status post hysterectomy. No adnexal masses. Other: No abdominal wall hernia or abnormality. No abdominopelvic ascites. Musculoskeletal: No acute or significant osseous findings. IMPRESSION: Peripancreatic inflammation around the head and body of the pancreas. Findings are consistent with acute pancreatitis. Electronically Signed   By: Aletta Edouard M.D.   On: 07/09/2019 12:17   MR ABDOMEN MRCP WO CONTRAST  Result Date: 07/09/2019 CLINICAL DATA:  Abdominal pain and vomiting. Acute pancreatitis. End-stage renal disease. EXAM: MRI ABDOMEN WITHOUT CONTRAST  (INCLUDING MRCP) TECHNIQUE: Multiplanar multisequence MR imaging of the abdomen was performed. Heavily T2-weighted images of the biliary and pancreatic ducts were obtained, and three-dimensional MRCP images were rendered by post processing. COMPARISON:  Noncontrast CT on 07/09/2019 FINDINGS: Lower chest: No acute findings. Hepatobiliary: No masses visualized on this unenhanced exam. Diffuse T2 hypointensity of hepatic parenchyma is consistent with iron overload. Gallbladder is unremarkable. Common bile duct measures 7 mm in diameter which is within normal limits  for age. No evidence of choledocholithiasis or biliary stricture. Pancreas: No evidence of pancreatic mass. Mild pancreatic and peripancreatic edema is consistent with acute pancreatitis. No peripancreatic fluid collections. Spleen: Within normal limits in size. Diffuse T2 hypointensity of parenchyma is consistent with iron overload. Adrenals/Urinary tract: Normal adrenal glands. 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 No evidence of renal mass or hydronephrosis. Stomach/Bowel: Visualized portion unremarkable. Vascular/Lymphatic: No pathologically enlarged lymph nodes identified. No evidence of abdominal aortic aneurysm. Other:  None. Musculoskeletal:  No suspicious bone lesions identified. IMPRESSION: Mild acute pancreatitis. No evidence of pancreatic necrosis, pseudocyst, or other complication. No evidence of biliary ductal dilatation or choledocholithiasis. Hemosiderosis. Electronically Signed   By: Marlaine Hind M.D.   On: 07/09/2019 18:30   MR 3D Recon At Scanner  Result Date: 07/09/2019 CLINICAL DATA:  Abdominal pain and vomiting. Acute pancreatitis. End-stage renal disease. EXAM: MRI ABDOMEN WITHOUT CONTRAST  (INCLUDING MRCP) TECHNIQUE: Multiplanar multisequence MR imaging of the abdomen was performed. Heavily T2-weighted images of  the biliary and pancreatic ducts were obtained, and three-dimensional MRCP images were rendered by post processing. COMPARISON:  Noncontrast CT on 07/09/2019 FINDINGS: Lower chest: No acute findings. Hepatobiliary: No masses visualized on this unenhanced exam. Diffuse T2 hypointensity of hepatic parenchyma is consistent with iron overload. Gallbladder is unremarkable. Common bile duct measures 7 mm in diameter which is within normal limits for age. No evidence of choledocholithiasis or biliary stricture. Pancreas: No evidence of pancreatic mass. Mild pancreatic and peripancreatic edema  is consistent with acute pancreatitis. No peripancreatic fluid collections. Spleen: Within normal limits in size. Diffuse T2 hypointensity of parenchyma is consistent with iron overload. Adrenals/Urinary tract: Normal adrenal glands. 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 No evidence of renal mass or hydronephrosis. Stomach/Bowel: Visualized portion unremarkable. Vascular/Lymphatic: No pathologically enlarged lymph nodes identified. No evidence of abdominal aortic aneurysm. Other:  None. Musculoskeletal:  No suspicious bone lesions identified. IMPRESSION: Mild acute pancreatitis. No evidence of pancreatic necrosis, pseudocyst, or other complication. No evidence of biliary ductal dilatation or choledocholithiasis. Hemosiderosis. Electronically Signed   By: Marlaine Hind M.D.   On: 07/09/2019 18:30   DG Chest Portable 1 View  Result Date: 07/09/2019 CLINICAL DATA:  Chest and upper abdominal pain. Shortness of breath. EXAM: PORTABLE CHEST 1 VIEW COMPARISON:  02/15/2019 FINDINGS: Numerous leads and wires project over the chest. Left upper extremity vascular stent. Midline trachea. Normal heart size. Tortuous thoracic aorta. Atherosclerosis in the transverse aorta. No pleural effusion or pneumothorax. Clear lungs. IMPRESSION: No acute cardiopulmonary disease. Aortic Atherosclerosis (ICD10-I70.0). Electronically Signed   By: Abigail Miyamoto M.D.   On: 07/09/2019 10:10   Medications: . [START ON 07/12/2019] ferric gluconate (FERRLECIT/NULECIT) IV     . amLODipine  10 mg Oral Daily  . Chlorhexidine Gluconate Cloth  6 each Topical Q0600  . cloNIDine  0.1 mg Oral BID  . [START ON 07/12/2019] doxercalciferol  4 mcg Intravenous Q T,Th,Sa-HD  . heparin  5,000 Units Subcutaneous Q8H  . metoprolol succinate  25 mg Oral Daily  . multivitamin  1 tablet Oral Daily  . nicotine  7 mg Transdermal Daily  . Sofosbuvir-Velpatasvir  1  tablet Oral Daily

## 2019-07-10 NOTE — Progress Notes (Signed)
Progress Note    Ashley [name]  [id number] DOB: 12-04-1950  DOA: 07/09/2019 PCP: Sonia Side., FNP    Brief Narrative:    Medical records reviewed and are as summarized below:  Ashley [name] is an 68 y.o. female presenting with abdominal pain.  She was found to have a mild pancreatitis.  Patient denies alcohol.  Assessment/Plan:   Principal Problem:   Acute pancreatitis Active Problems:   Hepatitis C   Dyslipidemia   Hypertension   Polysubstance abuse ([place])   Tobacco dependence   Recurrent acute pancreatitis -Patient with prior h/o acute idiopathic pancreatitis presenting with similar symptoms -She initially presented with abdominal pain on 5/7; she was discharged on 5/9 and d/c summary noted that she had acute intractable abdominal pain with poor PO intake and n/v; she had elevated lipase.  Pancreatitis was not clearly confirmed during that visit. -She returned on 7/23 with PE suggestive of acute pancreatitis.  She was hospitalized through 7/28 with elevated lipase and CT findings confirming acute pancreatitis.  This was idiopathic in nature and GI was consulted. -She followed up with [facility] on 8/21 and saw Dr. Hilarie Fredrickson and PA Zehr.  She was scheduled for MRI/MRCP and there was discussion about need for a possible EUS.  She does not appear to have had MRI/MRCP performed. -She returned to the ER on 9/21 and was again diagnosed with acute pancreatitis, but it was mild enough that she was discharged to home. -She now presents with abdominal pain with eating but not really n/v, unremarkable lipase, pancreatitis findings on CT -Slowly advance diet -Pain control with fentanyl 12.5 mg q2h prn.  -Nausea control with Zofran -MRCP: Mild acute pancreatitis.  No evidence of pancreatic necrosis, pseudocyst, or any other complication.  No biliary ductal dilatation or choledocholithiasis.  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 -She was started on Epclusa on 11/3 and is starting her last week of treatment this week for a total of 3 months -In review of side effects, pancreatitis is not listed and this was confirmed with pharmacy -She was changed from atorvastatin to rosuvastatin due to medication interaction prior to treatment with [name] -Will continue Epclusa for now  Polysubstance abuse -Patient acknowledges daily marijuana use, ongoing -Her last UDS that was + for cocaine was in 2019 -Encourage cessation  HTN -Continue Norvasc, Clonidine, Toprol XL -She reports that she has chosen to no longer take hydralazine so will stop  HLD -As noted above, she was changed from Lipitor to Crestor to accommodate Epclusa -However, both statins are associated with an increased risk for pancreatitis -Will hold Crestor for now  Tobacco dependence -Encourage cessation    Family Communication/Anticipated D/C date and plan/Code Status   DVT prophylaxis: Heparin Code Status: Full Code.  Family Communication: None Disposition Plan: Home in the a.m.   Medical Consultants:    Renal    Subjective:   Asking for clears  Objective:    Vitals:   07/09/19 2200 07/09/19 2249 07/09/19 2319 07/10/19 0505  BP: (!) 164/86 (!) 164/88 (!) 162/89 (!) 156/69  Pulse: 74 75 73 63  Resp:    18  Temp:  98.4 F (  36.9 C)  98.5 F (36.9 C)  TempSrc:  Oral  Oral  SpO2:  99% 98% 91%  Weight:  61.3 kg    Height:        Intake/Output Summary (Last 24 hours) at 07/10/2019 1143 Last data filed at 07/10/2019 0857 Gross per 24 hour  Intake 0 ml  Output 500 ml  Net -500 ml   Filed Weights   07/09/19 1004 07/09/19 1950 07/09/19 2249  Weight: 74.4 kg 61.8 kg 61.3 kg     Exam: In bed, no acute distress Positive bowel sounds tender to palpation mid epigastric region Alert and oriented x3 Pleasant cooperative Regular rate and rhythm No increased work of breathing  Data Reviewed:   I have personally reviewed following labs and imaging studies:  Labs: Labs show the following:   Basic Metabolic Panel: Recent Labs  Lab 07/09/19 0940 07/10/19 0354  NA 135 139  K 4.4 4.4  CL 92* 99  CO2 28 28  GLUCOSE 140* 70  BUN 30* 16  CREATININE 9.30* 6.66*  CALCIUM 9.6 9.0   GFR Estimated Creatinine Clearance: 6.8 mL/min (A) (by C-G formula based on SCr of 6.66 mg/dL (H)). Liver Function Tests: Recent Labs  Lab 07/09/19 0940 07/10/19 0354  AST 15 14*  ALT 9 10  ALKPHOS 53 47  BILITOT 0.1* 0.6  PROT 7.4 6.9  ALBUMIN 3.1* 2.8*   Recent Labs  Lab 07/09/19 0940  LIPASE 64*   No results for input(s): AMMONIA in the last 168 hours. Coagulation profile No results for input(s): INR, PROTIME in the last 168 hours.  CBC: Recent Labs  Lab 07/09/19 0940 07/10/19 0354  WBC 6.8 6.5  NEUTROABS 3.9  --   HGB 12.8 12.3  HCT 40.4 38.0  MCV 88.4 88.4  PLT 244 186   Cardiac Enzymes: No results for input(s): CKTOTAL, CKMB, CKMBINDEX, TROPONINI in the last 168 hours. BNP (last 3 results) No results for input(s): PROBNP in the last 8760 hours. CBG: No results for input(s): GLUCAP in the last 168 hours. D-Dimer: No results for input(s): DDIMER in the last 72 hours. Hgb A1c: No results for input(s): HGBA1C in the last 72 hours. Lipid Profile: No results for input(s): CHOL, HDL, LDLCALC, TRIG, CHOLHDL, LDLDIRECT in the last 72 hours. Thyroid function studies: No results for input(s): TSH, T4TOTAL, T3FREE, THYROIDAB in the last 72 hours.  Invalid input(s): FREET3 Anemia work up: No results for input(s): VITAMINB12, FOLATE, FERRITIN, TIBC, IRON, RETICCTPCT in the last 72 hours. Sepsis Labs: Recent Labs  Lab 07/09/19 0940 07/10/19 0354   WBC 6.8 6.5    Microbiology Recent Results (from the past 240 hour(s))  Respiratory Panel by RT PCR (Flu A&B, Covid) - Nasopharyngeal Swab     Status: None   Collection Time: 07/09/19  2:38 PM   Specimen: Nasopharyngeal Swab  Result Value Ref Range Status   SARS Coronavirus 2 by RT PCR NEGATIVE NEGATIVE Final    Comment: (NOTE) SARS-CoV-2 target nucleic acids are NOT DETECTED. The SARS-CoV-2 RNA is generally detectable in upper respiratoy specimens during the acute phase of infection. The lowest concentration of SARS-CoV-2 viral copies this assay can detect is 131 copies/mL. A negative result does not preclude SARS-Cov-2 infection and should not be used as the sole basis for treatment or other patient management decisions. A negative result may occur with  improper specimen collection/handling, submission of specimen other than nasopharyngeal swab, presence of viral mutation(s) within the areas targeted by this assay, and inadequate  number of viral copies (<131 copies/mL). A negative result must be combined with clinical observations, patient history, and epidemiological information. The expected result is Negative. Fact Sheet for Patients:  PinkCheek.be Fact Sheet for Healthcare Providers:  GravelBags.it This test is not yet ap proved or cleared by the [place] FDA and  has been authorized for detection and/or diagnosis of SARS-CoV-2 by FDA under an Emergency Use Authorization (EUA). This EUA will remain  in effect (meaning this test can be used) for the duration of the COVID-19 declaration under Section 564(b)(1) of the Act, 21 U.S.C. section 360bbb-3(b)(1), unless the authorization is terminated or revoked sooner.    Influenza A by PCR NEGATIVE NEGATIVE Final   Influenza B by PCR NEGATIVE NEGATIVE Final    Comment: (NOTE) The Xpert Xpress SARS-CoV-2/FLU/RSV assay is intended as an aid in  the diagnosis of  influenza from Nasopharyngeal swab specimens and  should not be used as a sole basis for treatment. Nasal washings and  aspirates are unacceptable for Xpert Xpress SARS-CoV-2/FLU/RSV  testing. Fact Sheet for Patients: PinkCheek.be Fact Sheet for Healthcare Providers: GravelBags.it This test is not yet approved or cleared by the [place] FDA and  has been authorized for detection and/or diagnosis of SARS-CoV-2 by  FDA under an Emergency Use Authorization (EUA). This EUA will remain  in effect (meaning this test can be used) for the duration of the  Covid-19 declaration under Section 564(b)(1) of the Act, 21  U.S.C. section 360bbb-3(b)(1), unless the authorization is  terminated or revoked. Performed at [facility], [place] [address], [place], [place] [address]     Procedures and diagnostic studies:  CT ABDOMEN PELVIS WO CONTRAST  Result Date: 07/09/2019 CLINICAL DATA:  Abdominal pain and vomiting. Elevated lipase and history of pancreatitis. EXAM: CT ABDOMEN AND PELVIS WITHOUT CONTRAST TECHNIQUE: Multidetector CT imaging of the abdomen and pelvis was performed following the standard protocol without IV contrast. COMPARISON:  03/01/2019 FINDINGS: Lower chest: Stable bibasilar scarring. Hepatobiliary: No focal liver abnormality is seen. No gallstones, gallbladder wall thickening, or biliary dilatation. Pancreas: Peripancreatic inflammation present around the head and body of the pancreas. No abnormal fluid collections. No pancreatic calcifications or significant pancreatic ductal dilatation. Spleen: Normal in size without focal abnormality. Adrenals/Urinary Tract: Adrenal glands are unremarkable. Kidneys are normal, without renal calculi, focal lesion, or hydronephrosis. Bladder is decompressed. Stomach/Bowel: Bowel shows no evidence of obstruction, ileus, inflammation or lesion. The appendix is normal. No free air identified.  Vascular/Lymphatic: Stable densely calcified plaque in the abdominal aorta and iliac arteries without evidence of aneurysmal disease. Reproductive: Status post hysterectomy. No adnexal masses. Other: No abdominal wall hernia or abnormality. No abdominopelvic ascites. Musculoskeletal: No acute or significant osseous findings. IMPRESSION: Peripancreatic inflammation around the head and body of the pancreas. Findings are consistent with acute pancreatitis. Electronically Signed   By: Aletta Edouard M.D.   On: 07/09/2019 12:17   MR ABDOMEN MRCP WO CONTRAST  Result Date: 07/09/2019 CLINICAL DATA:  Abdominal pain and vomiting. Acute pancreatitis. End-stage renal disease. EXAM: MRI ABDOMEN WITHOUT CONTRAST  (INCLUDING MRCP) TECHNIQUE: Multiplanar multisequence MR imaging of the abdomen was performed. Heavily T2-weighted images of the biliary and pancreatic ducts were obtained, and three-dimensional MRCP images were rendered by post processing. COMPARISON:  Noncontrast CT on 07/09/2019 FINDINGS: Lower chest: No acute findings. Hepatobiliary: No masses visualized on this unenhanced exam. Diffuse T2 hypointensity of hepatic parenchyma is consistent with iron overload. Gallbladder is unremarkable. Common bile duct measures 7 mm in  diameter which is within normal limits for age. No evidence of choledocholithiasis or biliary stricture. Pancreas: No evidence of pancreatic mass. Mild pancreatic and peripancreatic edema is consistent with acute pancreatitis. No peripancreatic fluid collections. Spleen: Within normal limits in size. Diffuse T2 hypointensity of parenchyma is consistent with iron overload. Adrenals/Urinary tract: Normal adrenal glands. 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 No evidence of renal mass or hydronephrosis. Stomach/Bowel: Visualized portion unremarkable. Vascular/Lymphatic: No pathologically enlarged lymph  nodes identified. No evidence of abdominal aortic aneurysm. Other:  None. Musculoskeletal:  No suspicious bone lesions identified. IMPRESSION: Mild acute pancreatitis. No evidence of pancreatic necrosis, pseudocyst, or other complication. No evidence of biliary ductal dilatation or choledocholithiasis. Hemosiderosis. Electronically Signed   By: Marlaine Hind M.D.   On: 07/09/2019 18:30   MR 3D Recon At Scanner  Result Date: 07/09/2019 CLINICAL DATA:  Abdominal pain and vomiting. Acute pancreatitis. End-stage renal disease. EXAM: MRI ABDOMEN WITHOUT CONTRAST  (INCLUDING MRCP) TECHNIQUE: Multiplanar multisequence MR imaging of the abdomen was performed. Heavily T2-weighted images of the biliary and pancreatic ducts were obtained, and three-dimensional MRCP images were rendered by post processing. COMPARISON:  Noncontrast CT on 07/09/2019 FINDINGS: Lower chest: No acute findings. Hepatobiliary: No masses visualized on this unenhanced exam. Diffuse T2 hypointensity of hepatic parenchyma is consistent with iron overload. Gallbladder is unremarkable. Common bile duct measures 7 mm in diameter which is within normal limits for age. No evidence of choledocholithiasis or biliary stricture. Pancreas: No evidence of pancreatic mass. Mild pancreatic and peripancreatic edema is consistent with acute pancreatitis. No peripancreatic fluid collections. Spleen: Within normal limits in size. Diffuse T2 hypointensity of parenchyma is consistent with iron overload. Adrenals/Urinary tract: Normal adrenal glands. 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 No evidence of renal mass or hydronephrosis. Stomach/Bowel: Visualized portion unremarkable. Vascular/Lymphatic: No pathologically enlarged lymph nodes identified. No evidence of abdominal aortic aneurysm. Other:  None. Musculoskeletal:  No suspicious bone lesions identified. IMPRESSION: Mild  acute pancreatitis. No evidence of pancreatic necrosis, pseudocyst, or other complication. No evidence of biliary ductal dilatation or choledocholithiasis. Hemosiderosis. Electronically Signed   By: Marlaine Hind M.D.   On: 07/09/2019 18:30   DG Chest Portable 1 View  Result Date: 07/09/2019 CLINICAL DATA:  Chest and upper abdominal pain. Shortness of breath. EXAM: PORTABLE CHEST 1 VIEW COMPARISON:  02/15/2019 FINDINGS: Numerous leads and wires project over the chest. Left upper extremity vascular stent. Midline trachea. Normal heart size. Tortuous thoracic aorta. Atherosclerosis in the transverse aorta. No pleural effusion or pneumothorax. Clear lungs. IMPRESSION: No acute cardiopulmonary disease. Aortic Atherosclerosis (ICD10-I70.0). Electronically Signed   By: Abigail Miyamoto M.D.   On: 07/09/2019 10:10    Medications:   . amLODipine  10 mg Oral Daily  . Chlorhexidine Gluconate Cloth  6 each Topical Q0600  . cloNIDine  0.1 mg Oral BID  . [START ON 07/12/2019] doxercalciferol  4 mcg Intravenous Q T,Th,Sa-HD  . heparin  5,000 Units Subcutaneous Q8H  . metoprolol succinate  25 mg Oral Daily  . multivitamin  1 tablet Oral Daily  . nicotine  7 mg Transdermal Daily  . Sofosbuvir-Velpatasvir  1 tablet Oral Daily   Continuous Infusions: . [START ON 07/12/2019] ferric gluconate (FERRLECIT/NULECIT) IV       LOS: 0 days   Geradine Girt  [facility]   How to contact the [facility] Attending or  Consulting provider [place] or covering provider during after hours [place], for this patient?  1. Check the care team in [facility] and look for a) attending/consulting [facility] provider listed and b) the [facility] team listed 2. Log into [email or web address] and use [place] universal password to access. If you do not have the password, please contact the hospital operator. 3. Locate the [facility] provider you are looking for under [facility] and page to a number that you can be directly reached. 4. If you still have  difficulty reaching the provider, please page the [facility] (Director on Call) for the Hospitalists listed on amion for assistance.  07/10/2019, 11:43 AM

## 2019-07-11 ENCOUNTER — Ambulatory Visit: Payer: Medicare Other | Admitting: Internal Medicine

## 2019-07-11 DIAGNOSIS — Z992 Dependence on renal dialysis: Secondary | ICD-10-CM | POA: Diagnosis not present

## 2019-07-11 DIAGNOSIS — K8500 Idiopathic acute pancreatitis without necrosis or infection: Secondary | ICD-10-CM | POA: Diagnosis not present

## 2019-07-11 MED ORDER — OXYCODONE HCL 5 MG PO TABS: 5.0000 mg | ORAL_TABLET | Freq: Three times a day (TID) | ORAL | 0 refills | Status: DC | PRN

## 2019-07-11 MED ORDER — NEPRO/CARBSTEADY PO LIQD: 237.0000 mL | Freq: Three times a day (TID) | ORAL | 0 refills | Status: DC | PRN

## 2019-07-11 NOTE — Plan of Care (Deleted)
Pt for discharge going home, alert and oriented, tolerates her meal, ambulatory, left AV fistula, complained of abdominal pain given fentanyl as ordered, discontinued peripheral IV line, given health teachings, next appointment, due med explained and understood, given all her personal belongings, waiting for her family to pick her up.

## 2019-07-11 NOTE — Progress Notes (Signed)
[facility] Progress Note   Dialysis Orders: TTS [place] 160 Optiflux 3.5 hours EDW 62.5 2 K 2.25 Ca profile 4 heparin 2000 with 1000 mid tm left upper AVF Recent labs: hgb 1.25 - up from 10.4 - last Mircera 59 q 2 last 1/21  and venofer 50/week on Tuesdays iPTH 614 Hectorol 4 Aurixya 1 ac sensipar 30/day Current BP meds amlodipine 10 and hydral 100 bid clonidine 0.1 bid-- also on MTP 25 succ - not on dialysis med list - needs updating at d/c   Assessment/Plan: 1. Recurrent pancreatitis -per primary. 2. ESRD -  TTS - next HD Tuesday - tolerated HD without problems Saturday, plan next HD here tomorrow if remains in hospital o/w can go to outpt unit with attention to EDW as I suspect she will continue to lose weight as she recovers.  3. Hypertension/volume  - post BP average 150/80 recently getting to 62.1 below edw - lowest BP during tmts 110s/60s- needs accurate weight pre HD and UF to EDW as tolerated. CXR neg net UF 500 Sat with post wt 61.3 - BP still up - suspect EDW may need to go down a tad more >  Will request wt to be done today.  4. Anemia  - hgb 12s - no ESA for now - continue weekly Fe - due on Tuesdays 5. Metabolic bone disease -  Continue hectorol/ add binders when taking solids 6. Nutrition - NPO > CL per primary  7. Hx Hep C on Epclusa since 04/12/19 - 12 week course  8. Hx CVA 9. Hx substance abuse      Subjective:   Says not tolerating much diet due to epigastric pain.  Appears she may discharge today.  No complaints of dyspnea, edema.   Objective Vitals:   07/10/19 0505 07/10/19 1416 07/10/19 2150 07/11/19 0526  BP: (!) 156/69 (!) 143/85 (!) 164/78 (!) 171/73  Pulse: 63 66 62 (!) 53  Resp: 18 16 18 16   Temp: 98.5 F (36.9 C) 98.6 F (37 C) 99 F (37.2 C) 98.2 F (36.8 C)  TempSrc: Oral Oral Oral Oral  SpO2: 91% 95% 92% 95%  Weight:      Height:       Physical Exam General: NAD breathing easily on room air Heart: RRR Lungs: no rales Abdomen:  soft with epigastric tenderness  Extremities: no LE edema Dialysis Access:  Left upper AVF + bruit   Additional Objective Labs: Basic Metabolic Panel: Recent Labs  Lab 07/09/19 0940 07/10/19 0354  NA 135 139  K 4.4 4.4  CL 92* 99  CO2 28 28  GLUCOSE 140* 70  BUN 30* 16  CREATININE 9.30* 6.66*  CALCIUM 9.6 9.0   Liver Function Tests: Recent Labs  Lab 07/09/19 0940 07/10/19 0354  AST 15 14*  ALT 9 10  ALKPHOS 53 47  BILITOT 0.1* 0.6  PROT 7.4 6.9  ALBUMIN 3.1* 2.8*   Recent Labs  Lab 07/09/19 0940  LIPASE 64*   CBC: Recent Labs  Lab 07/09/19 0940 07/10/19 0354  WBC 6.8 6.5  NEUTROABS 3.9  --   HGB 12.8 12.3  HCT 40.4 38.0  MCV 88.4 88.4  PLT 244 186   Blood Culture    Component Value Date/Time   SDES URINE, RANDOM 06/20/2017 2222   SPECREQUEST NONE 06/20/2017 2222   CULT MULTIPLE SPECIES PRESENT, SUGGEST RECOLLECTION (A) 06/20/2017 2222   REPTSTATUS 06/22/2017 FINAL 06/20/2017 2222    Cardiac Enzymes: No results for input(s): CKTOTAL,  CKMB, CKMBINDEX, TROPONINI in the last 168 hours. CBG: No results for input(s): GLUCAP in the last 168 hours. Iron Studies: No results for input(s): IRON, TIBC, TRANSFERRIN, FERRITIN in the last 72 hours. Lab Results  Component Value Date   INR 1.0 04/28/2018   INR 1.05 06/13/2017   Studies/Results: CT ABDOMEN PELVIS WO CONTRAST  Result Date: 07/09/2019 CLINICAL DATA:  Abdominal pain and vomiting. Elevated lipase and history of pancreatitis. EXAM: CT ABDOMEN AND PELVIS WITHOUT CONTRAST TECHNIQUE: Multidetector CT imaging of the abdomen and pelvis was performed following the standard protocol without IV contrast. COMPARISON:  03/01/2019 FINDINGS: Lower chest: Stable bibasilar scarring. Hepatobiliary: No focal liver abnormality is seen. No gallstones, gallbladder wall thickening, or biliary dilatation. Pancreas: Peripancreatic inflammation present around the head and body of the pancreas. No abnormal fluid collections.  No pancreatic calcifications or significant pancreatic ductal dilatation. Spleen: Normal in size without focal abnormality. Adrenals/Urinary Tract: Adrenal glands are unremarkable. Kidneys are normal, without renal calculi, focal lesion, or hydronephrosis. Bladder is decompressed. Stomach/Bowel: Bowel shows no evidence of obstruction, ileus, inflammation or lesion. The appendix is normal. No free air identified. Vascular/Lymphatic: Stable densely calcified plaque in the abdominal aorta and iliac arteries without evidence of aneurysmal disease. Reproductive: Status post hysterectomy. No adnexal masses. Other: No abdominal wall hernia or abnormality. No abdominopelvic ascites. Musculoskeletal: No acute or significant osseous findings. IMPRESSION: Peripancreatic inflammation around the head and body of the pancreas. Findings are consistent with acute pancreatitis. Electronically Signed   By: Aletta Edouard M.D.   On: 07/09/2019 12:17   MR ABDOMEN MRCP WO CONTRAST  Result Date: 07/09/2019 CLINICAL DATA:  Abdominal pain and vomiting. Acute pancreatitis. End-stage renal disease. EXAM: MRI ABDOMEN WITHOUT CONTRAST  (INCLUDING MRCP) TECHNIQUE: Multiplanar multisequence MR imaging of the abdomen was performed. Heavily T2-weighted images of the biliary and pancreatic ducts were obtained, and three-dimensional MRCP images were rendered by post processing. COMPARISON:  Noncontrast CT on 07/09/2019 FINDINGS: Lower chest: No acute findings. Hepatobiliary: No masses visualized on this unenhanced exam. Diffuse T2 hypointensity of hepatic parenchyma is consistent with iron overload. Gallbladder is unremarkable. Common bile duct measures 7 mm in diameter which is within normal limits for age. No evidence of choledocholithiasis or biliary stricture. Pancreas: No evidence of pancreatic mass. Mild pancreatic and peripancreatic edema is consistent with acute pancreatitis. No peripancreatic fluid collections. Spleen: Within normal  limits in size. Diffuse T2 hypointensity of parenchyma is consistent with iron overload. Adrenals/Urinary tract: Normal adrenal glands. 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 No evidence of renal mass or hydronephrosis. Stomach/Bowel: Visualized portion unremarkable. Vascular/Lymphatic: No pathologically enlarged lymph nodes identified. No evidence of abdominal aortic aneurysm. Other:  None. Musculoskeletal:  No suspicious bone lesions identified. IMPRESSION: Mild acute pancreatitis. No evidence of pancreatic necrosis, pseudocyst, or other complication. No evidence of biliary ductal dilatation or choledocholithiasis. Hemosiderosis. Electronically Signed   By: Marlaine Hind M.D.   On: 07/09/2019 18:30   MR 3D Recon At Scanner  Result Date: 07/09/2019 CLINICAL DATA:  Abdominal pain and vomiting. Acute pancreatitis. End-stage renal disease. EXAM: MRI ABDOMEN WITHOUT CONTRAST  (INCLUDING MRCP) TECHNIQUE: Multiplanar multisequence MR imaging of the abdomen was performed. Heavily T2-weighted images of the biliary and pancreatic ducts were obtained, and three-dimensional MRCP images were rendered by post processing. COMPARISON:  Noncontrast CT on 07/09/2019 FINDINGS: Lower chest: No acute findings. Hepatobiliary: No masses visualized on this unenhanced exam. Diffuse T2 hypointensity of hepatic parenchyma  is consistent with iron overload. Gallbladder is unremarkable. Common bile duct measures 7 mm in diameter which is within normal limits for age. No evidence of choledocholithiasis or biliary stricture. Pancreas: No evidence of pancreatic mass. Mild pancreatic and peripancreatic edema is consistent with acute pancreatitis. No peripancreatic fluid collections. Spleen: Within normal limits in size. Diffuse T2 hypointensity of parenchyma is consistent with iron overload. Adrenals/Urinary tract: Normal adrenal glands. 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 No evidence of renal mass or hydronephrosis. Stomach/Bowel: Visualized portion unremarkable. Vascular/Lymphatic: No pathologically enlarged lymph nodes identified. No evidence of abdominal aortic aneurysm. Other:  None. Musculoskeletal:  No suspicious bone lesions identified. IMPRESSION: Mild acute pancreatitis. No evidence of pancreatic necrosis, pseudocyst, or other complication. No evidence of biliary ductal dilatation or choledocholithiasis. Hemosiderosis. Electronically Signed   By: Marlaine Hind M.D.   On: 07/09/2019 18:30   DG Chest Portable 1 View  Result Date: 07/09/2019 CLINICAL DATA:  Chest and upper abdominal pain. Shortness of breath. EXAM: PORTABLE CHEST 1 VIEW COMPARISON:  02/15/2019 FINDINGS: Numerous leads and wires project over the chest. Left upper extremity vascular stent. Midline trachea. Normal heart size. Tortuous thoracic aorta. Atherosclerosis in the transverse aorta. No pleural effusion or pneumothorax. Clear lungs. IMPRESSION: No acute cardiopulmonary disease. Aortic Atherosclerosis (ICD10-I70.0). Electronically Signed   By: Abigail Miyamoto M.D.   On: 07/09/2019 10:10   Medications: . [START ON 07/12/2019] ferric gluconate (FERRLECIT/NULECIT) IV     . amLODipine  10 mg Oral Daily  . Chlorhexidine Gluconate Cloth  6 each Topical Q0600  . cloNIDine  0.1 mg Oral BID  . [START ON 07/12/2019] doxercalciferol  4 mcg Intravenous Q T,Th,Sa-HD  . heparin  5,000 Units Subcutaneous Q8H  . metoprolol succinate  25 mg Oral Daily  . multivitamin  1 tablet Oral Daily  . nicotine  7 mg Transdermal Daily  . Sofosbuvir-Velpatasvir  1 tablet Oral Daily

## 2019-07-11 NOTE — Plan of Care (Signed)
Pt for discharge going home, alert and oriented, tolerates her meal, ambulatory, given pain med for abdominal pain, discontinued peripheral IV line, given health teachings, next appointment, due med explained and understood, given all her personal belongings, waiting for her family to pick her up.

## 2019-07-11 NOTE — Progress Notes (Signed)
Pt discharged home.

## 2019-07-11 NOTE — Discharge Summary (Signed)
Physician Discharge Summary  Ashley [name] [id number] DOB: 01/10/1951 DOA: 07/09/2019  PCP: Sonia Side., FNP  Admit date: 07/09/2019 Discharge date: 07/11/2019  Time spent: 45 minutes  Recommendations for Outpatient Follow-up:  1. Follow up with GI 5-10 days for evaluation of recurrent pancreatitis  2. Dialysis per usual schedule 3. Full liquid diet to advance slowly as tolerated. Avoid fatty foods   Discharge Diagnoses:  Principal Problem:   Acute pancreatitis Active Problems:   Hepatitis C   Dyslipidemia   Hypertension   Polysubstance abuse ([place])   Tobacco dependence   Discharge Condition: stable  Diet recommendation: full liquid to advance slowly as tolerated  Filed Weights   07/09/19 1004 07/09/19 1950 07/09/19 2249  Weight: 74.4 kg 61.8 kg 61.3 kg    History of present illness:  Ashley [name] is a 69 y.o. female with medical history significant of CVA; polysubstance abuse; HTN; HLD; Hep C; ESRD on TTS HD; and recurrent pancreatitis presented 1/30 with abdominal pain.  She reported she had been doing her "regular everything, going to dialysis and back home."  For the previous 3 times she has been hospitalized, it was related to her pancreas. She described upper abdominal pain anytime she tried to eat or drink  She vomited 2 days prior to presentation but none since.  She tried to eat 1 day prior and she had severe pain after.   She didn't go to HD  due to pain.  She does not drink ETOH.  She had been on Hep C medication for 2 months and 3 weeks - this is the last week.  No fevers.  Her last BM was Wednesday.   Hospital Course:  Recurrent acute pancreatitis Patient with prior h/o acute idiopathic pancreatitis presenting with similar symptoms. -She initially presented with abdominal pain on 5/7; she was discharged on 5/9 and d/c summary noted that she had acute intractable abdominal pain with poor PO intake and n/v; she had elevated lipase. Pancreatitis was not  clearly confirmed during that visit. -She returned on 7/23 with PE suggestive of acute pancreatitis. She was hospitalized through 7/28 with elevated lipase and CT findings confirming acute pancreatitis. This was idiopathic in nature and GI was consulted. -She followed up with [facility] on 8/21 and saw Dr. Hilarie Fredrickson and PA Zehr. She was scheduled for MRI/MRCP and there was discussion about need for a possible EUS. She does not appear to have had MRI/MRCP performed. -She returned to the ER on 9/21 and was again diagnosed with acute pancreatitis, but it was mild enough that she was discharged to home. -She present 1/30 with abdominal pain with eating but not really n/v, unremarkable lipase, pancreatitis findings on CT. -MRCP: Mild acute pancreatitis.  No evidence of pancreatic necrosis, pseudocyst, or any other complication.  No biliary ductal dilatation or choledocholithiasis.  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 No nausea or vomiting. She is afebrile non-toxic appearing and hemodynamically stable.  -Will need to follow-up outpatient with GI  ESRD on TTS HD -Patient on chronic TTS HD -Nephrology following, she got HD yesterday.  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 -She was started on Epclusa on 11/3 and is starting her last week of treatment this week for a total of 3 months -In review of side effects, pancreatitis is not listed and this was confirmed with pharmacy -She was changed  from atorvastatin to rosuvastatin due to medication interaction prior to treatment with Epclusa -Will continue Epclusa   Polysubstance abuse -Patient acknowledges daily marijuana use, ongoing -Her last UDS that was + for cocaine was in 2019 -Encourage cessation  HTN -Continue Norvasc, Clonidine, Toprol XL -She reported that she has chosen to no longer take hydralazine so will  stop  HLD -As noted above, she was changed from Lipitor to Crestor to accommodate Epclusa -However, both statins are associated with an increased risk for pancreatitis -Will stop Crestor for now  Tobacco dependence -Encourage cessation  Procedures:     Consultations:  [facility]  Discharge Exam: Vitals:   07/10/19 2150 07/11/19 0526  BP: (!) 164/78 (!) 171/73  Pulse: 62 (!) 53  Resp: 18 16  Temp: 99 F (37.2 C) 98.2 F (36.8 C)  SpO2: 92% 95%    General: awake alert no acute distress Cardiovascular: rrr no mgr no LE edema Respiratory: normal effort BS clear bilaterally no wheeze Abd: non-distended soft +BS mild diffuse tenderness abdomen with palpation worse in epigastric area. No guarding or rebounding  Discharge Instructions   Discharge Instructions    Call MD for:  persistant dizziness or light-headedness   Complete by: As directed    Call MD for:  severe uncontrolled pain   Complete by: As directed    Call MD for:  temperature >100.4   Complete by: As directed    Diet - low sodium heart healthy   Complete by: As directed    Discharge instructions   Complete by: As directed    Take medications as prescribed Eat small frequent meals starting with full liquids and gradually advance to bland food Avoid fatty foods Follow up with GI 5-10 days for evaluation of recurrent pancreatitis   Increase activity slowly   Complete by: As directed      Allergies as of 07/11/2019      Reactions   Aspirin Nausea And Vomiting   Stomach ache   Ibuprofen Nausea And Vomiting   Stomach ache      Medication List    STOP taking these medications   hydrALAZINE 50 MG tablet Commonly known as: APRESOLINE   rosuvastatin 10 MG tablet Commonly known as: CRESTOR     TAKE these medications   amLODipine 10 MG tablet Commonly known as: NORVASC Take 1 tablet (10 mg total) by mouth daily.   cloNIDine 0.1 MG tablet Commonly known as: CATAPRES Take 0.1 mg by  mouth 2 (two) times daily.   DIALYVITE 800 WITH ZINC 0.8 MG Tabs Take 1 tablet by mouth daily.   feeding supplement (NEPRO CARB STEADY) Liqd Take 237 mLs by mouth 3 (three) times daily as needed (Supplement).   metoprolol succinate 25 MG 24 hr tablet Commonly known as: TOPROL-XL Take 25 mg by mouth daily.   oxyCODONE 5 MG immediate release tablet Commonly known as: Roxicodone Take 1 tablet (5 mg total) by mouth every 8 (eight) hours as needed for up to 5 days for severe pain or breakthrough pain.   Sofosbuvir-Velpatasvir 400-100 MG Tabs Commonly known as: Epclusa Take 1 tablet by mouth daily. Take 1 tablet by mouth daily.      Allergies  Allergen Reactions  . Aspirin Nausea And Vomiting    Stomach ache  . Ibuprofen Nausea And Vomiting    Stomach ache      The results of significant diagnostics from this hospitalization (including imaging, microbiology, ancillary and laboratory) are listed below for reference.  Significant Diagnostic Studies: CT ABDOMEN PELVIS WO CONTRAST  Result Date: 07/09/2019 CLINICAL DATA:  Abdominal pain and vomiting. Elevated lipase and history of pancreatitis. EXAM: CT ABDOMEN AND PELVIS WITHOUT CONTRAST TECHNIQUE: Multidetector CT imaging of the abdomen and pelvis was performed following the standard protocol without IV contrast. COMPARISON:  03/01/2019 FINDINGS: Lower chest: Stable bibasilar scarring. Hepatobiliary: No focal liver abnormality is seen. No gallstones, gallbladder wall thickening, or biliary dilatation. Pancreas: Peripancreatic inflammation present around the head and body of the pancreas. No abnormal fluid collections. No pancreatic calcifications or significant pancreatic ductal dilatation. Spleen: Normal in size without focal abnormality. Adrenals/Urinary Tract: Adrenal glands are unremarkable. Kidneys are normal, without renal calculi, focal lesion, or hydronephrosis. Bladder is decompressed. Stomach/Bowel: Bowel shows no evidence of  obstruction, ileus, inflammation or lesion. The appendix is normal. No free air identified. Vascular/Lymphatic: Stable densely calcified plaque in the abdominal aorta and iliac arteries without evidence of aneurysmal disease. Reproductive: Status post hysterectomy. No adnexal masses. Other: No abdominal wall hernia or abnormality. No abdominopelvic ascites. Musculoskeletal: No acute or significant osseous findings. IMPRESSION: Peripancreatic inflammation around the head and body of the pancreas. Findings are consistent with acute pancreatitis. Electronically Signed   By: Aletta Edouard M.D.   On: 07/09/2019 12:17   MR ABDOMEN MRCP WO CONTRAST  Result Date: 07/09/2019 CLINICAL DATA:  Abdominal pain and vomiting. Acute pancreatitis. End-stage renal disease. EXAM: MRI ABDOMEN WITHOUT CONTRAST  (INCLUDING MRCP) TECHNIQUE: Multiplanar multisequence MR imaging of the abdomen was performed. Heavily T2-weighted images of the biliary and pancreatic ducts were obtained, and three-dimensional MRCP images were rendered by post processing. COMPARISON:  Noncontrast CT on 07/09/2019 FINDINGS: Lower chest: No acute findings. Hepatobiliary: No masses visualized on this unenhanced exam. Diffuse T2 hypointensity of hepatic parenchyma is consistent with iron overload. Gallbladder is unremarkable. Common bile duct measures 7 mm in diameter which is within normal limits for age. No evidence of choledocholithiasis or biliary stricture. Pancreas: No evidence of pancreatic mass. Mild pancreatic and peripancreatic edema is consistent with acute pancreatitis. No peripancreatic fluid collections. Spleen: Within normal limits in size. Diffuse T2 hypointensity of parenchyma is consistent with iron overload. Adrenals/Urinary tract: Normal adrenal glands. 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 No evidence of renal mass or hydronephrosis.  Stomach/Bowel: Visualized portion unremarkable. Vascular/Lymphatic: No pathologically enlarged lymph nodes identified. No evidence of abdominal aortic aneurysm. Other:  None. Musculoskeletal:  No suspicious bone lesions identified. IMPRESSION: Mild acute pancreatitis. No evidence of pancreatic necrosis, pseudocyst, or other complication. No evidence of biliary ductal dilatation or choledocholithiasis. Hemosiderosis. Electronically Signed   By: Marlaine Hind M.D.   On: 07/09/2019 18:30   MR 3D Recon At Scanner  Result Date: 07/09/2019 CLINICAL DATA:  Abdominal pain and vomiting. Acute pancreatitis. End-stage renal disease. EXAM: MRI ABDOMEN WITHOUT CONTRAST  (INCLUDING MRCP) TECHNIQUE: Multiplanar multisequence MR imaging of the abdomen was performed. Heavily T2-weighted images of the biliary and pancreatic ducts were obtained, and three-dimensional MRCP images were rendered by post processing. COMPARISON:  Noncontrast CT on 07/09/2019 FINDINGS: Lower chest: No acute findings. Hepatobiliary: No masses visualized on this unenhanced exam. Diffuse T2 hypointensity of hepatic parenchyma is consistent with iron overload. Gallbladder is unremarkable. Common bile duct measures 7 mm in diameter which is within normal limits for age. No evidence of choledocholithiasis or biliary stricture. Pancreas: No evidence of pancreatic mass. Mild pancreatic and peripancreatic edema is consistent with acute pancreatitis. No peripancreatic fluid collections.  Spleen: Within normal limits in size. Diffuse T2 hypointensity of parenchyma is consistent with iron overload. Adrenals/Urinary tract: Normal adrenal glands. 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 No evidence of renal mass or hydronephrosis. Stomach/Bowel: Visualized portion unremarkable. Vascular/Lymphatic: No pathologically enlarged lymph nodes identified. No evidence of abdominal aortic  aneurysm. Other:  None. Musculoskeletal:  No suspicious bone lesions identified. IMPRESSION: Mild acute pancreatitis. No evidence of pancreatic necrosis, pseudocyst, or other complication. No evidence of biliary ductal dilatation or choledocholithiasis. Hemosiderosis. Electronically Signed   By: Marlaine Hind M.D.   On: 07/09/2019 18:30   DG Chest Portable 1 View  Result Date: 07/09/2019 CLINICAL DATA:  Chest and upper abdominal pain. Shortness of breath. EXAM: PORTABLE CHEST 1 VIEW COMPARISON:  02/15/2019 FINDINGS: Numerous leads and wires project over the chest. Left upper extremity vascular stent. Midline trachea. Normal heart size. Tortuous thoracic aorta. Atherosclerosis in the transverse aorta. No pleural effusion or pneumothorax. Clear lungs. IMPRESSION: No acute cardiopulmonary disease. Aortic Atherosclerosis (ICD10-I70.0). Electronically Signed   By: Abigail Miyamoto M.D.   On: 07/09/2019 10:10    Microbiology: Recent Results (from the past 240 hour(s))  Respiratory Panel by RT PCR (Flu A&B, Covid) - Nasopharyngeal Swab     Status: None   Collection Time: 07/09/19  2:38 PM   Specimen: Nasopharyngeal Swab  Result Value Ref Range Status   SARS Coronavirus 2 by RT PCR NEGATIVE NEGATIVE Final    Comment: (NOTE) SARS-CoV-2 target nucleic acids are NOT DETECTED. The SARS-CoV-2 RNA is generally detectable in upper respiratoy specimens during the acute phase of infection. The lowest concentration of SARS-CoV-2 viral copies this assay can detect is 131 copies/mL. A negative result does not preclude SARS-Cov-2 infection and should not be used as the sole basis for treatment or other patient management decisions. A negative result may occur with  improper specimen collection/handling, submission of specimen other than nasopharyngeal swab, presence of viral mutation(s) within the areas targeted by this assay, and inadequate number of viral copies (<131 copies/mL). A negative result must be combined  with clinical observations, patient history, and epidemiological information. The expected result is Negative. Fact Sheet for Patients:  PinkCheek.be Fact Sheet for Healthcare Providers:  GravelBags.it This test is not yet ap proved or cleared by the [place] FDA and  has been authorized for detection and/or diagnosis of SARS-CoV-2 by FDA under an Emergency Use Authorization (EUA). This EUA will remain  in effect (meaning this test can be used) for the duration of the COVID-19 declaration under Section 564(b)(1) of the Act, 21 U.S.C. section 360bbb-3(b)(1), unless the authorization is terminated or revoked sooner.    Influenza A by PCR NEGATIVE NEGATIVE Final   Influenza B by PCR NEGATIVE NEGATIVE Final    Comment: (NOTE) The Xpert Xpress SARS-CoV-2/FLU/RSV assay is intended as an aid in  the diagnosis of influenza from Nasopharyngeal swab specimens and  should not be used as a sole basis for treatment. Nasal washings and  aspirates are unacceptable for Xpert Xpress SARS-CoV-2/FLU/RSV  testing. Fact Sheet for Patients: PinkCheek.be Fact Sheet for Healthcare Providers: GravelBags.it This test is not yet approved or cleared by the [place] FDA and  has been authorized for detection and/or diagnosis of SARS-CoV-2 by  FDA under an Emergency Use Authorization (EUA). This EUA will remain  in effect (meaning this test can be used) for the duration of the  Covid-19 declaration under Section 564(b)(1) of the  Act, 21  U.S.C. section 360bbb-3(b)(1), unless the authorization is  terminated or revoked. Performed at [facility], [place] [address], [place], [place] [address]      Labs: Basic Metabolic Panel: Recent Labs  Lab 07/09/19 0940 07/10/19 0354  NA 135 139  K 4.4 4.4  CL 92* 99  CO2 28 28  GLUCOSE 140* 70  BUN 30* 16  CREATININE 9.30* 6.66*   CALCIUM 9.6 9.0   Liver Function Tests: Recent Labs  Lab 07/09/19 0940 07/10/19 0354  AST 15 14*  ALT 9 10  ALKPHOS 53 47  BILITOT 0.1* 0.6  PROT 7.4 6.9  ALBUMIN 3.1* 2.8*   Recent Labs  Lab 07/09/19 0940  LIPASE 64*   No results for input(s): AMMONIA in the last 168 hours. CBC: Recent Labs  Lab 07/09/19 0940 07/10/19 0354  WBC 6.8 6.5  NEUTROABS 3.9  --   HGB 12.8 12.3  HCT 40.4 38.0  MCV 88.4 88.4  PLT 244 186   Cardiac Enzymes: No results for input(s): CKTOTAL, CKMB, CKMBINDEX, TROPONINI in the last 168 hours. BNP: BNP (last 3 results) No results for input(s): BNP in the last 8760 hours.  ProBNP (last 3 results) No results for input(s): PROBNP in the last 8760 hours.  CBG: No results for input(s): GLUCAP in the last 168 hours.     SignedRadene Gunning NP [facility] 07/11/2019, 10:16 AM

## 2019-07-11 NOTE — Discharge Instructions (Signed)
Acute Pancreatitis    Acute pancreatitis happens when the pancreas gets swollen. The pancreas is a large gland in the body that helps to control blood sugar. It also makes enzymes that help to digest food.  This condition can last a few days and cause serious problems. The lungs, heart, and kidneys may stop working.  What are the causes?  Causes include:  · Alcohol abuse.  · Drug abuse.  · Gallstones.  · A tumor in the pancreas.  Other causes include:  · Some medicines.  · Some chemicals.  · Diabetes.  · An infection.  · Damage caused by an accident.  · The poison (venom) from a scorpion bite.  · Belly (abdominal) surgery.  · The body's defense system (immune system) attacking the pancreas (autoimmune pancreatitis).  · Genes that are passed from parent to child (inherited).  In some cases, the cause is not known.  What are the signs or symptoms?  · Pain in the upper belly that may be felt in the back. The pain may be very bad.  · Swelling of the belly.  · Feeling sick to your stomach (nauseous) and throwing up (vomiting).  · Fever.  How is this treated?  You will likely have to stay in the hospital. Treatment may include:  · Pain medicine.  · Fluid through an IV tube.  · Placing a tube in the stomach to take out the stomach contents. This may help you stop throwing up.  · Not eating for 3-4 days.  · Antibiotic medicines, if you have an infection.  · Treating any other problems that may be the cause.  · Steroid medicines, if your problem is caused by your defense system attacking your body's own tissues.  · Surgery.  Follow these instructions at home:  Eating and drinking    · Follow instructions from your doctor about what to eat and drink.  · Eat foods that do not have a lot of fat in them.  · Eat small meals often. Do not eat big meals.  · Drink enough fluid to keep your pee (urine) pale yellow.  · Do not drink alcohol if it caused your condition.  Medicines  · Take over-the-counter and prescription medicines only  as told by your doctor.  · Ask your doctor if the medicine prescribed to you:  ? Requires you to avoid driving or using heavy machinery.  ? Can cause trouble pooping (constipation). You may need to take steps to prevent or treat trouble pooping:  § Take over-the-counter or prescription medicines.  § Eat foods that are high in fiber. These include beans, whole grains, and fresh fruits and vegetables.  § Limit foods that are high in fat and sugar. These include fried or sweet foods.  General instructions  · Do not use any products that contain nicotine or tobacco, such as cigarettes, e-cigarettes, and chewing tobacco. If you need help quitting, ask your doctor.  · Get plenty of rest.  · Check your blood sugar at home as told by your doctor.  · Keep all follow-up visits as told by your doctor. This is important.  Contact a doctor if:  · You do not get better as quickly as expected.  · You have new symptoms.  · Your symptoms get worse.  · You have pain or weakness that lasts a long time.  · You keep feeling sick to your stomach.  · You get better and then you have pain again.  ·   You have a fever.  Get help right away if:  · You cannot eat or keep fluids down.  · Your pain gets very bad.  · Your skin or the white part of your eyes turns yellow.  · You have sudden swelling in your belly.  · You throw up.  · You feel dizzy or you pass out (faint).  · Your blood sugar is high (over 300 mg/dL).  Summary  · Acute pancreatitis happens when the pancreas gets swollen.  · This condition is often caused by alcohol abuse, drug abuse, or gallstones.  · You will likely have to stay in the hospital for treatment.  This information is not intended to replace advice given to you by your health care provider. Make sure you discuss any questions you have with your health care provider.  Document Revised: 03/15/2018 Document Reviewed: 03/15/2018  Elsevier Patient Education © 2020 [employer]

## 2019-07-12 ENCOUNTER — Telehealth: Payer: Self-pay

## 2019-07-12 ENCOUNTER — Telehealth: Payer: Self-pay | Admitting: Nephrology

## 2019-07-12 NOTE — Telephone Encounter (Signed)
NOTES ON [place], SENT REFERRAL TO [place]

## 2019-07-12 NOTE — Telephone Encounter (Signed)
Transition of Care Contact from [place]   Date of Discharge: 07/11/19  Date of Contact: 07/12/19 Method of contact: phone Talked to patient   Patient contacted to discuss transition of care form recent hospitaliztion. Patient was admitted to [facility] from 1/30 to 2/1 with the discharge diagnosis of acute recurrent pancreatitis.    Medication changes were reviewed - hydralazine and crestor stopped.    Patient will follow up with is outpatient dialysis center on 07/14/19.  Did not go to dialysis today because ongoing abdominal pain and weakness.  Advised to limit fluids/potassium and not to miss another dialysis.    Other follow up needs include f/u with GI.   Jen Mow, PA-C [facility] Pager: [phone]

## 2019-07-14 ENCOUNTER — Inpatient Hospital Stay (HOSPITAL_COMMUNITY)
Admission: EM | Admit: 2019-07-14 | Discharge: 2019-07-20 | DRG: 438 | Disposition: A | Discharge status: Home Health | Payer: Medicare Other | Attending: Family Medicine | Admitting: Family Medicine

## 2019-07-14 ENCOUNTER — Other Ambulatory Visit: Payer: Self-pay

## 2019-07-14 ENCOUNTER — Encounter (HOSPITAL_COMMUNITY): Payer: Self-pay | Admitting: Internal Medicine

## 2019-07-14 ENCOUNTER — Emergency Department (HOSPITAL_COMMUNITY): Payer: Medicare Other

## 2019-07-14 DIAGNOSIS — K861 Other chronic pancreatitis: Secondary | ICD-10-CM | POA: Diagnosis present

## 2019-07-14 DIAGNOSIS — Z79899 Other long term (current) drug therapy: Secondary | ICD-10-CM

## 2019-07-14 DIAGNOSIS — K5900 Constipation, unspecified: Secondary | ICD-10-CM | POA: Diagnosis present

## 2019-07-14 DIAGNOSIS — F17210 Nicotine dependence, cigarettes, uncomplicated: Secondary | ICD-10-CM | POA: Diagnosis present

## 2019-07-14 DIAGNOSIS — N186 End stage renal disease: Secondary | ICD-10-CM | POA: Diagnosis present

## 2019-07-14 DIAGNOSIS — I160 Hypertensive urgency: Secondary | ICD-10-CM | POA: Diagnosis present

## 2019-07-14 DIAGNOSIS — N2581 Secondary hyperparathyroidism of renal origin: Secondary | ICD-10-CM | POA: Diagnosis present

## 2019-07-14 DIAGNOSIS — B1920 Unspecified viral hepatitis C without hepatic coma: Secondary | ICD-10-CM | POA: Diagnosis present

## 2019-07-14 DIAGNOSIS — B182 Chronic viral hepatitis C: Secondary | ICD-10-CM | POA: Diagnosis not present

## 2019-07-14 DIAGNOSIS — K859 Acute pancreatitis without necrosis or infection, unspecified: Principal | ICD-10-CM | POA: Diagnosis present

## 2019-07-14 DIAGNOSIS — Z8673 Personal history of transient ischemic attack (TIA), and cerebral infarction without residual deficits: Secondary | ICD-10-CM

## 2019-07-14 DIAGNOSIS — R0902 Hypoxemia: Secondary | ICD-10-CM

## 2019-07-14 DIAGNOSIS — I10 Essential (primary) hypertension: Secondary | ICD-10-CM | POA: Diagnosis present

## 2019-07-14 DIAGNOSIS — Z20822 Contact with and (suspected) exposure to covid-19: Secondary | ICD-10-CM | POA: Diagnosis present

## 2019-07-14 DIAGNOSIS — Z90710 Acquired absence of both cervix and uterus: Secondary | ICD-10-CM

## 2019-07-14 DIAGNOSIS — Z992 Dependence on renal dialysis: Secondary | ICD-10-CM

## 2019-07-14 DIAGNOSIS — E875 Hyperkalemia: Secondary | ICD-10-CM | POA: Diagnosis present

## 2019-07-14 DIAGNOSIS — J9601 Acute respiratory failure with hypoxia: Secondary | ICD-10-CM | POA: Diagnosis present

## 2019-07-14 DIAGNOSIS — J9600 Acute respiratory failure, unspecified whether with hypoxia or hypercapnia: Secondary | ICD-10-CM | POA: Clinically undetermined

## 2019-07-14 DIAGNOSIS — E785 Hyperlipidemia, unspecified: Secondary | ICD-10-CM | POA: Diagnosis not present

## 2019-07-14 DIAGNOSIS — D631 Anemia in chronic kidney disease: Secondary | ICD-10-CM | POA: Diagnosis present

## 2019-07-14 DIAGNOSIS — I120 Hypertensive chronic kidney disease with stage 5 chronic kidney disease or end stage renal disease: Secondary | ICD-10-CM | POA: Diagnosis present

## 2019-07-14 LAB — CREATININE, SERUM
Creatinine, Ser: 15.45 mg/dL — ABNORMAL HIGH (ref 0.44–1.00)
GFR calc Af Amer: 2 mL/min — ABNORMAL LOW (ref >60)
GFR calc non Af Amer: 2 mL/min — ABNORMAL LOW (ref >60)

## 2019-07-14 LAB — COMPREHENSIVE METABOLIC PANEL
ALT: 11 U/L (ref 0–44)
AST: 17 U/L (ref 15–41)
Albumin: 3.4 g/dL — ABNORMAL LOW (ref 3.5–5.0)
Alkaline Phosphatase: 53 U/L (ref 38–126)
Anion gap: 19 — ABNORMAL HIGH (ref 5–15)
BUN: 70 mg/dL — ABNORMAL HIGH (ref 8–23)
CO2: 22 mmol/L (ref 22–32)
Calcium: 9.4 mg/dL (ref 8.9–10.3)
Chloride: 94 mmol/L — ABNORMAL LOW (ref 98–111)
Creatinine, Ser: 15.21 mg/dL — ABNORMAL HIGH (ref 0.44–1.00)
GFR calc Af Amer: 2 mL/min — ABNORMAL LOW (ref >60)
GFR calc non Af Amer: 2 mL/min — ABNORMAL LOW (ref >60)
Glucose, Bld: 95 mg/dL (ref 70–99)
Potassium: 5.3 mmol/L — ABNORMAL HIGH (ref 3.5–5.1)
Sodium: 135 mmol/L (ref 135–145)
Total Bilirubin: 0.3 mg/dL (ref 0.3–1.2)
Total Protein: 7.5 g/dL (ref 6.5–8.1)

## 2019-07-14 LAB — LIPASE, BLOOD
Lipase: 79 U/L — ABNORMAL HIGH (ref 11–51)
Lipase: 82 U/L — ABNORMAL HIGH (ref 11–51)

## 2019-07-14 LAB — CBC WITH DIFFERENTIAL/PLATELET
Abs Immature Granulocytes: 0.01 10*3/uL (ref 0.00–0.07)
Basophils Absolute: 0 10*3/uL (ref 0.0–0.1)
Basophils Relative: 0 %
Eosinophils Absolute: 0.3 10*3/uL (ref 0.0–0.5)
Eosinophils Relative: 4 %
HCT: 36.5 % (ref 36.0–46.0)
Hemoglobin: 11.7 g/dL — ABNORMAL LOW (ref 12.0–15.0)
Immature Granulocytes: 0 %
Lymphocytes Relative: 26 %
Lymphs Abs: 2 10*3/uL (ref 0.7–4.0)
MCH: 27.8 pg (ref 26.0–34.0)
MCHC: 32.1 g/dL (ref 30.0–36.0)
MCV: 86.7 fL (ref 80.0–100.0)
Monocytes Absolute: 1 10*3/uL (ref 0.1–1.0)
Monocytes Relative: 13 %
Neutro Abs: 4.2 10*3/uL (ref 1.7–7.7)
Neutrophils Relative %: 57 %
Platelets: 202 10*3/uL (ref 150–400)
RBC: 4.21 MIL/uL (ref 3.87–5.11)
RDW: 17.5 % — ABNORMAL HIGH (ref 11.5–15.5)
WBC: 7.5 10*3/uL (ref 4.0–10.5)
nRBC: 0 % (ref 0.0–0.2)

## 2019-07-14 LAB — SARS CORONAVIRUS 2 (TAT 6-24 HRS): SARS Coronavirus 2: NEGATIVE

## 2019-07-14 LAB — CBC
HCT: 36.2 % (ref 36.0–46.0)
Hemoglobin: 11.8 g/dL — ABNORMAL LOW (ref 12.0–15.0)
MCH: 28.5 pg (ref 26.0–34.0)
MCHC: 32.6 g/dL (ref 30.0–36.0)
MCV: 87.4 fL (ref 80.0–100.0)
Platelets: 223 10*3/uL (ref 150–400)
RBC: 4.14 MIL/uL (ref 3.87–5.11)
RDW: 17.5 % — ABNORMAL HIGH (ref 11.5–15.5)
WBC: 7.5 10*3/uL (ref 4.0–10.5)
nRBC: 0 % (ref 0.0–0.2)

## 2019-07-14 MED ORDER — ONDANSETRON HCL 4 MG/2ML IJ SOLN: 4.0000 mg | Freq: Four times a day (QID) | INTRAMUSCULAR | Status: DC | PRN

## 2019-07-14 MED ORDER — OXYCODONE HCL 5 MG PO TABS
5.0000 mg | ORAL_TABLET | ORAL | Status: DC | PRN
  Administered 2019-07-14 – 2019-07-15 (×4): 10 mg via ORAL
  Administered 2019-07-15: 08:00:00 5 mg via ORAL
  Administered 2019-07-15 – 2019-07-18 (×13): 10 mg via ORAL
  Filled 2019-07-14: qty 2
  Filled 2019-07-14: qty 1
  Filled 2019-07-14 (×16): qty 2

## 2019-07-14 MED ORDER — ONDANSETRON HCL 4 MG PO TABS: 4.0000 mg | ORAL_TABLET | Freq: Four times a day (QID) | ORAL | Status: DC | PRN

## 2019-07-14 MED ORDER — MORPHINE SULFATE (PF) 2 MG/ML IV SOLN
2.0000 mg | INTRAVENOUS | Status: DC | PRN
  Administered 2019-07-14 – 2019-07-16 (×9): 2 mg via INTRAVENOUS
  Filled 2019-07-14 (×9): qty 1

## 2019-07-14 MED ORDER — CLONIDINE HCL 0.1 MG PO TABS
0.1000 mg | ORAL_TABLET | Freq: Two times a day (BID) | ORAL | Status: DC
  Administered 2019-07-14 – 2019-07-20 (×12): 0.1 mg via ORAL
  Filled 2019-07-14 (×12): qty 1

## 2019-07-14 MED ORDER — ACETAMINOPHEN 325 MG PO TABS
650.0000 mg | ORAL_TABLET | Freq: Four times a day (QID) | ORAL | Status: DC | PRN
  Filled 2019-07-14: qty 2

## 2019-07-14 MED ORDER — MORPHINE SULFATE (PF) 4 MG/ML IV SOLN
4.0000 mg | Freq: Once | INTRAVENOUS | Status: AC
  Administered 2019-07-14: 12:00:00 4 mg via INTRAMUSCULAR
  Filled 2019-07-14: qty 1

## 2019-07-14 MED ORDER — MORPHINE SULFATE (PF) 4 MG/ML IV SOLN: 4.0000 mg | Freq: Once | INTRAVENOUS | Status: DC

## 2019-07-14 MED ORDER — METOPROLOL SUCCINATE ER 25 MG PO TB24
25.0000 mg | ORAL_TABLET | Freq: Every day | ORAL | Status: DC
  Administered 2019-07-14 – 2019-07-20 (×7): 25 mg via ORAL
  Filled 2019-07-14 (×7): qty 1

## 2019-07-14 MED ORDER — ACETAMINOPHEN 650 MG RE SUPP: 650.0000 mg | Freq: Four times a day (QID) | RECTAL | Status: DC | PRN

## 2019-07-14 MED ORDER — HEPARIN SODIUM (PORCINE) 5000 UNIT/ML IJ SOLN
5000.0000 [IU] | Freq: Three times a day (TID) | INTRAMUSCULAR | Status: DC
  Administered 2019-07-15 – 2019-07-18 (×12): 5000 [IU] via SUBCUTANEOUS
  Filled 2019-07-14 (×13): qty 1

## 2019-07-14 MED ORDER — AMLODIPINE BESYLATE 10 MG PO TABS
10.0000 mg | ORAL_TABLET | Freq: Every day | ORAL | Status: DC
  Administered 2019-07-14 – 2019-07-20 (×7): 10 mg via ORAL
  Filled 2019-07-14 (×7): qty 1

## 2019-07-14 MED ORDER — SOFOSBUVIR-VELPATASVIR 400-100 MG PO TABS: 1.0000 | ORAL_TABLET | Freq: Every day | ORAL | Status: DC

## 2019-07-14 NOTE — Progress Notes (Signed)
Subjective:  Just recently dc Recurrent acute/ chronic  Pancreatitis . No reports " Abd pain  hurt too bad to go HD  tue and today and came to ER with Abd pain . / denies sob / last hd  07/09/19/ reports little po intake   Objective Vital signs in last 24 hours: Vitals:   07/14/19 1245 07/14/19 1300 07/14/19 1330 07/14/19 1445  BP: (!) 162/78  (!) 162/79 (!) 164/88  Pulse: (!) 55 (!) 57 (!) 57 61  Resp:  11 18 20   Temp:    (!) 97.5 F (36.4 C)  TempSrc:    Oral  SpO2: 97% 99% 98% 100%  Weight:    61.4 kg  Height:    5\' 2"  (1.575 m)   Weight change:   Physical Exam: General:  Alert .Pleasant ,elderly AAF , NAD  Ox3 Heart: RRR, No m, r, G Lungs: Scattered soft wheezing , unlabored  Breathing  Abdomen: BS  Pos , soft , diffusely tender no rebound , no ascites  Extremities: no pedal edema Dialysis Access:  Pos bruit  LUA AVF   Dialysis Orders: Center: [facility] on TTS . EDW 61.0 HD [place]  2k, 2.25 ca   Time 3.5 hrs  Heparin 2000 and 1000 mid  txm . Access LUA AVF     Hec 4 mcg IV/HD  Mircera 50 mcg  q 2wks  (last given 06/30/19) hgb >11 recent hosp    Sensipar 30mg   q day   Problem/Plan: 1. Abd pain / HO Chronic/ Recurrent  Pancreatitis - per admit  2. ESRD -  HD  TTS  K 5.3 / Cr 15.21 with CXR  No volume excess ( last HD sat 07/09/19 ) hd today  keep even 3. Hypertension/volume  - No volume excess on CXR / bp up in ER  continued home meds on Amlodipine 10 mg qd, Metoprolol xl 25 mg q day, Clonidine 0.1 mg bid  4. Anemia  - hgb 11.7 no esa needs currently 5. Metabolic bone disease -  contiued vit d on hd  And binder when po foods  6. HO CVA  7. HO Hep C on Epclusa  Since 04/12/19  8. HO substance abuse    Ernest Haber, [place] [facility] Beeper [phone] 07/14/2019,2:57 PM  LOS: 0 days   Labs: Basic Metabolic Panel: Recent Labs  Lab 07/09/19 0940 07/10/19 0354 07/14/19 0833  NA 135 139 135  K 4.4 4.4 5.3*  CL 92* 99 94*  CO2 28 28 22   GLUCOSE 140* 70 95   BUN 30* 16 70*  CREATININE 9.30* 6.66* 15.21*  CALCIUM 9.6 9.0 9.4   Liver Function Tests: Recent Labs  Lab 07/09/19 0940 07/10/19 0354 07/14/19 0833  AST 15 14* 17  ALT 9 10 11   ALKPHOS 53 47 53  BILITOT 0.1* 0.6 0.3  PROT 7.4 6.9 7.5  ALBUMIN 3.1* 2.8* 3.4*   Recent Labs  Lab 07/09/19 0940 07/14/19 0833  LIPASE 64* 79*   No results for input(s): AMMONIA in the last 168 hours. CBC: Recent Labs  Lab 07/09/19 0940 07/10/19 0354 07/14/19 0915  WBC 6.8 6.5 7.5  NEUTROABS 3.9  --  4.2  HGB 12.8 12.3 11.7*  HCT 40.4 38.0 36.5  MCV 88.4 88.4 86.7  PLT 244 186 202   Cardiac Enzymes: No results for input(s): CKTOTAL, CKMB, CKMBINDEX, TROPONINI in the last 168 hours. CBG: No results for input(s): GLUCAP in the last 168 hours.  Studies/Results: DG Chest  2 View  Result Date: 07/14/2019 CLINICAL DATA:  Hypoxia, abdominal pain EXAM: CHEST - 2 VIEW COMPARISON:  07/09/2019 FINDINGS: The heart size and mediastinal contours are within normal limits. Calcific aortic knob. No focal airspace consolidation, pleural effusion, or pneumothorax. The visualized skeletal structures are unremarkable. Vascular stent within the left upper extremity. IMPRESSION: No active cardiopulmonary disease. Electronically Signed   By: Davina Poke D.O.   On: 07/14/2019 10:04   Medications:

## 2019-07-14 NOTE — ED Notes (Signed)
Got patient undress on the monitor patient is resting with call bell in reach 

## 2019-07-14 NOTE — H&P (Signed)
History and Physical    Ashley [name] [id number] DOB: 05-08-1951 DOA: 07/14/2019  PCP: Sonia Side., FNP  Patient coming from: Home  I have personally briefly reviewed patient's old medical records in [place]  Chief Complaint: Abdominal pain  HPI: Ashley [name] is a 69 y.o. female with medical history significant of end-stage renal disease on hemodialysis Tuesday, Thursday, Saturday, recurrent pancreatitis, was recently discharged from the hospital on 07/11/2019 after being treated for an episode of pancreatitis.  After returning home, she reports worsening of abdominal pain.  Pain was similar to the pancreatitis pain that she had while she was in the hospital.  Her pain got worse when she tried to eat or drink anything.  She was unable to keep anything down by mouth.  She mainly tried to consume broth as well as Nepro drinks.  She reports that her pain has substantially worse after trying to drink anything.  Her p.o. intake is been poor the last several days.  She has not had any diarrhea.  No fever, cough, shortness of breath.  Due to her worsening abdominal pain, she came back to the emergency room for evaluation.  ED Course: On arrival to the emergency room, potassium noted to be elevated at 5.3.  Her last dialysis was on 1/30.  EKG did not show any acute changes.  Lipase mildly elevated at 79.  On her last admission, lipase was only 64, but radiographic evidence of pancreatitis was noted on MRCP.  Due to her persistent abdominal pain, diminished p.o. intake and need for dialysis, she has been referred for admission.  Review of Systems: As per HPI otherwise 10 point review of systems negative.    Past Medical History:  Diagnosis Date  . Acute pancreatitis 12/2018  . Arthritis   . Cervical radiculopathy 02/28/2011  . Cocaine substance abuse ([place]) 05/26/2013   positive UDS   . ESRD on hemodialysis (HCC)    TTS  . Gastropathy   . GERD (gastroesophageal reflux disease)   .  Hepatitis C 1987   history of IVDA  . Hiatal hernia   . Hyperlipidemia   . Hypertension   . Marijuana abuse 12/18/204   positive UDS, family members smoke as well  . Pancreatitis 2000   resolved  . 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 Schatzki's ring   . Stroke ([place])   . Ulcer 1990   gastric ulcer. Ruptured s/p emergency repair    Past Surgical History:  Procedure Laterality Date  . ABDOMINAL HYSTERECTOMY  1979  . AV FISTULA PLACEMENT Left 06/16/2017   Procedure: ARTERIOVENOUS (AV) FISTULA CREATION LEFT ARM;  Surgeon: [place], MD;  Location: [place];  Service: Vascular;  Laterality: Left;  . BASCILIC VEIN TRANSPOSITION Left 10/02/2017   Procedure: BASILIC VEIN TRANSPOSITION SECOND STAGE LEFT ARM;  Surgeon: Rosetta Posner, MD;  Location: [facility] OR;  Service: Vascular;  Laterality: Left;  . ESOPHAGOGASTRODUODENOSCOPY N/A 05/29/2013   Procedure: ESOPHAGOGASTRODUODENOSCOPY (EGD);  Surgeon: Jerene Bears, MD;  Location: [facility] ENDOSCOPY;  Service: Endoscopy;  Laterality: N/A;  . EXCHANGE OF A DIALYSIS CATHETER Left 07/31/2017   Procedure: Removal  OF A  Right GroinTUNNELED  DIALYSIS CATHETER ,  Insertion of Left Femoral Dialysis Catheter.;  Surgeon: Rosetta Posner, MD;  Location: [place];  Service: Vascular;  Laterality: Left;  . INSERTION OF DIALYSIS CATHETER Right 06/16/2017   Procedure: INSERTION OF DIALYSIS CATHETER;  Surgeon: Conrad [place], MD;  Location: [place];  Service: Vascular;  Laterality: Right;  . IR AV DIALY SHUNT INTRO NEEDLE/INTRACATH INITIAL W/PTA/IMG LEFT  06/21/2018  . REPAIR OF PERFORATED ULCER      Social History:  reports that she has been smoking cigarettes. She has a 10.00 pack-year smoking history. She has never used smokeless tobacco. She reports previous drug use. Drugs: Heroin, Marijuana, and Cocaine. She reports that she does not drink alcohol.  Allergies  Allergen Reactions  . Aspirin Nausea And Vomiting    Stomach ache  . Ibuprofen Nausea And Vomiting     Stomach ache    Family History  Problem Relation Age of Onset  . Hypertension Father   . Cancer Father   . Hyperlipidemia Father   . Seizures Sister   . Early death Daughter   . Kidney disease Daughter        end stage dialysis dependent      Prior to Admission medications   Medication Sig Start Date End Date Taking? Authorizing Provider  amLODipine (NORVASC) 10 MG tablet Take 1 tablet (10 mg total) by mouth daily. 11/11/16   Katheren Shams, DO  B Complex-C-Zn-Folic Acid (DIALYVITE Q000111Q WITH ZINC) 0.8 MG TABS Take 1 tablet by mouth daily. 07/07/19   [provider]  cloNIDine (CATAPRES) 0.1 MG tablet Take 0.1 mg by mouth 2 (two) times daily. 06/25/19   [provider]  metoprolol succinate (TOPROL-XL) 25 MG 24 hr tablet Take 25 mg by mouth daily.    [provider]  Nutritional Supplements (FEEDING SUPPLEMENT, NEPRO CARB STEADY,) LIQD Take 237 mLs by mouth 3 (three) times daily as needed (Supplement). 07/11/19   Black, Lezlie Octave, NP  oxyCODONE (ROXICODONE) 5 MG immediate release tablet Take 1 tablet (5 mg total) by mouth every 8 (eight) hours as needed for up to 5 days for severe pain or breakthrough pain. 07/11/19 07/16/19  Geradine Girt, DO  Sofosbuvir-Velpatasvir (EPCLUSA) 400-100 MG TABS Take 1 tablet by mouth daily. Take 1 tablet by mouth daily. Patient not taking: Reported on 07/09/2019 04/12/19   [place], FNP    Physical Exam: Vitals:   07/14/19 1245 07/14/19 1300 07/14/19 1330 07/14/19 1445  BP: (!) 162/78  (!) 162/79 (!) 164/88  Pulse: (!) 55 (!) 57 (!) 57 61  Resp:  11 18 20   Temp:    (!) 97.5 F (36.4 C)  TempSrc:    Oral  SpO2: 97% 99% 98% 100%  Weight:    61.4 kg  Height:    5\' 2"  (1.575 m)    Constitutional: NAD, calm, comfortable Eyes: PERRL, lids and conjunctivae normal ENMT: Mucous membranes are moist. Posterior pharynx clear of any exudate or lesions.Normal dentition.  Neck: normal, supple, no masses, no thyromegaly Respiratory:  clear to auscultation bilaterally, no wheezing, no crackles. Normal respiratory effort. No accessory muscle use.  Cardiovascular: Regular rate and rhythm, no murmurs / rubs / gallops. No extremity edema. 2+ pedal pulses. No carotid bruits.  Abdomen: Soft, diffusely tender, no masses palpated. No hepatosplenomegaly. Bowel sounds positive.  Musculoskeletal: no clubbing / cyanosis. No joint deformity upper and lower extremities. Good ROM, no contractures. Normal muscle tone.  Skin: no rashes, lesions, ulcers. No induration Neurologic: CN 2-12 grossly intact. Sensation intact, DTR normal. Strength 5/5 in all 4.  Psychiatric: Normal judgment and insight. Alert and oriented x 3. Normal mood.    Labs on Admission: I have personally reviewed following labs and imaging studies  CBC: Recent Labs  Lab 07/09/19 0940 07/10/19  0354 07/14/19 0915  WBC 6.8 6.5 7.5  NEUTROABS 3.9  --  4.2  HGB 12.8 12.3 11.7*  HCT 40.4 38.0 36.5  MCV 88.4 88.4 86.7  PLT 244 186 [id number]   Basic Metabolic Panel: Recent Labs  Lab 07/09/19 0940 07/10/19 0354 07/14/19 0833  NA 135 139 135  K 4.4 4.4 5.3*  CL 92* 99 94*  CO2 28 28 22   GLUCOSE 140* 70 95  BUN 30* 16 70*  CREATININE 9.30* 6.66* 15.21*  CALCIUM 9.6 9.0 9.4   GFR: Estimated Creatinine Clearance: 3 mL/min (A) (by C-G formula based on SCr of 15.21 mg/dL (H)). Liver Function Tests: Recent Labs  Lab 07/09/19 0940 07/10/19 0354 07/14/19 0833  AST 15 14* 17  ALT 9 10 11   ALKPHOS 53 47 53  BILITOT 0.1* 0.6 0.3  PROT 7.4 6.9 7.5  ALBUMIN 3.1* 2.8* 3.4*   Recent Labs  Lab 07/09/19 0940 07/14/19 0833  LIPASE 64* 79*   No results for input(s): AMMONIA in the last 168 hours. Coagulation Profile: No results for input(s): INR, PROTIME in the last 168 hours. Cardiac Enzymes: No results for input(s): CKTOTAL, CKMB, CKMBINDEX, TROPONINI in the last 168 hours. BNP (last 3 results) No results for input(s): PROBNP in the last 8760 hours. HbA1C: No  results for input(s): HGBA1C in the last 72 hours. CBG: No results for input(s): GLUCAP in the last 168 hours. Lipid Profile: No results for input(s): CHOL, HDL, LDLCALC, TRIG, CHOLHDL, LDLDIRECT in the last 72 hours. Thyroid Function Tests: No results for input(s): TSH, T4TOTAL, FREET4, T3FREE, THYROIDAB in the last 72 hours. Anemia Panel: No results for input(s): VITAMINB12, FOLATE, FERRITIN, TIBC, IRON, RETICCTPCT in the last 72 hours. Urine analysis:    Component Value Date/Time   COLORURINE YELLOW 12/31/2018 1014   APPEARANCEUR CLEAR 12/31/2018 1014   LABSPEC 1.008 12/31/2018 1014   PHURINE 9.0 (H) 12/31/2018 1014   GLUCOSEU NEGATIVE 12/31/2018 1014   HGBUR SMALL (A) 12/31/2018 1014   BILIRUBINUR NEGATIVE 12/31/2018 1014   BILIRUBINUR NEG 01/29/2016 1142   KETONESUR NEGATIVE 12/31/2018 1014   PROTEINUR >=300 (A) 12/31/2018 1014   UROBILINOGEN 0.2 01/29/2016 1142   UROBILINOGEN 0.2 03/21/2015 1649   NITRITE NEGATIVE 12/31/2018 1014   LEUKOCYTESUR NEGATIVE 12/31/2018 1014    Radiological Exams on Admission: DG Chest 2 View  Result Date: 07/14/2019 CLINICAL DATA:  Hypoxia, abdominal pain EXAM: CHEST - 2 VIEW COMPARISON:  07/09/2019 FINDINGS: The heart size and mediastinal contours are within normal limits. Calcific aortic knob. No focal airspace consolidation, pleural effusion, or pneumothorax. The visualized skeletal structures are unremarkable. Vascular stent within the left upper extremity. IMPRESSION: No active cardiopulmonary disease. Electronically Signed   By: Davina Poke D.O.   On: 07/14/2019 10:04    EKG: Independently reviewed.  Sinus rhythm without acute changes.  Assessment/Plan Active Problems:   Hepatitis C   Dyslipidemia   Hypertension   ESRD (end stage renal disease) on dialysis (HCC)   Pancreatitis   Hyperkalemia     1. Recurrent pancreatitis.  Patient has acute on chronic pancreatitis.  She does not have any clear precipitating factors at this  time.  Treat supportively with pain management.  Would avoid aggressive IV hydration to her dialysis status.  Start on clear liquids and advance as tolerated. 2. End-stage renal disease on hemodialysis.  Nephrology following.  She is undergoing dialysis today. 3. Hyperkalemia.  This should correct with dialysis. 4. Hypertension.  Continue home regimen.  Blood pressure mildly elevated,  but this should improve after dialysis. 5. Dyslipidemia.  Not on statin since this can predispose to pancreatitis with concurrent Epclusa use  DVT prophylaxis: Heparin Code Status: Full code Family Communication: Discussed with patient Disposition Plan: Discharge home once abdominal pain has improved and able to tolerate p.o. intake Consults called: Nephrology Admission status: Observation, MedSurg  Kathie Dike MD [facility]   If 7PM-7AM, please contact night-coverage [email or web address]   07/14/2019, 3:55 PM

## 2019-07-14 NOTE — ED Triage Notes (Signed)
Pt coming from HD via [facility] with complaints of abdominal pain and constipation X1 week. Pt did not receive HD TX today. Last HD was 07/09/2019.

## 2019-07-14 NOTE — ED Notes (Signed)
Pt noted to have oxygen saturations of 87% with good pleth. PA Gerald Stabs made aware. CXR ordered per verbal order. Pt placed on 2L [place].

## 2019-07-14 NOTE — ED Notes (Signed)
Per lab, cbc clotted. Will place new order and resend specimen.

## 2019-07-14 NOTE — ED Notes (Signed)
IV team at bedside 

## 2019-07-14 NOTE — Progress Notes (Signed)
Report given to Dialysis RN.

## 2019-07-15 DIAGNOSIS — I10 Essential (primary) hypertension: Secondary | ICD-10-CM | POA: Diagnosis not present

## 2019-07-15 DIAGNOSIS — K861 Other chronic pancreatitis: Secondary | ICD-10-CM | POA: Diagnosis present

## 2019-07-15 DIAGNOSIS — Z79899 Other long term (current) drug therapy: Secondary | ICD-10-CM | POA: Diagnosis not present

## 2019-07-15 DIAGNOSIS — N186 End stage renal disease: Secondary | ICD-10-CM | POA: Diagnosis present

## 2019-07-15 DIAGNOSIS — E875 Hyperkalemia: Secondary | ICD-10-CM | POA: Diagnosis present

## 2019-07-15 DIAGNOSIS — I120 Hypertensive chronic kidney disease with stage 5 chronic kidney disease or end stage renal disease: Secondary | ICD-10-CM | POA: Diagnosis present

## 2019-07-15 DIAGNOSIS — Z90710 Acquired absence of both cervix and uterus: Secondary | ICD-10-CM | POA: Diagnosis not present

## 2019-07-15 DIAGNOSIS — E785 Hyperlipidemia, unspecified: Secondary | ICD-10-CM | POA: Diagnosis present

## 2019-07-15 DIAGNOSIS — F17210 Nicotine dependence, cigarettes, uncomplicated: Secondary | ICD-10-CM | POA: Diagnosis present

## 2019-07-15 DIAGNOSIS — Z992 Dependence on renal dialysis: Secondary | ICD-10-CM | POA: Diagnosis not present

## 2019-07-15 DIAGNOSIS — N2581 Secondary hyperparathyroidism of renal origin: Secondary | ICD-10-CM | POA: Diagnosis present

## 2019-07-15 DIAGNOSIS — D631 Anemia in chronic kidney disease: Secondary | ICD-10-CM | POA: Diagnosis present

## 2019-07-15 DIAGNOSIS — K859 Acute pancreatitis without necrosis or infection, unspecified: Secondary | ICD-10-CM | POA: Diagnosis present

## 2019-07-15 DIAGNOSIS — K5900 Constipation, unspecified: Secondary | ICD-10-CM | POA: Diagnosis present

## 2019-07-15 DIAGNOSIS — Z20822 Contact with and (suspected) exposure to covid-19: Secondary | ICD-10-CM | POA: Diagnosis present

## 2019-07-15 DIAGNOSIS — I160 Hypertensive urgency: Secondary | ICD-10-CM | POA: Diagnosis present

## 2019-07-15 DIAGNOSIS — J9601 Acute respiratory failure with hypoxia: Secondary | ICD-10-CM | POA: Diagnosis present

## 2019-07-15 DIAGNOSIS — B1920 Unspecified viral hepatitis C without hepatic coma: Secondary | ICD-10-CM | POA: Diagnosis present

## 2019-07-15 DIAGNOSIS — Z8673 Personal history of transient ischemic attack (TIA), and cerebral infarction without residual deficits: Secondary | ICD-10-CM | POA: Diagnosis not present

## 2019-07-15 LAB — COMPREHENSIVE METABOLIC PANEL
ALT: 9 U/L (ref 0–44)
AST: 15 U/L (ref 15–41)
Albumin: 3.3 g/dL — ABNORMAL LOW (ref 3.5–5.0)
Alkaline Phosphatase: 58 U/L (ref 38–126)
Anion gap: 14 (ref 5–15)
BUN: 22 mg/dL (ref 8–23)
CO2: 29 mmol/L (ref 22–32)
Calcium: 9.3 mg/dL (ref 8.9–10.3)
Chloride: 94 mmol/L — ABNORMAL LOW (ref 98–111)
Creatinine, Ser: 8.3 mg/dL — ABNORMAL HIGH (ref 0.44–1.00)
GFR calc Af Amer: 5 mL/min — ABNORMAL LOW (ref >60)
GFR calc non Af Amer: 4 mL/min — ABNORMAL LOW (ref >60)
Glucose, Bld: 134 mg/dL — ABNORMAL HIGH (ref 70–99)
Potassium: 4.4 mmol/L (ref 3.5–5.1)
Sodium: 137 mmol/L (ref 135–145)
Total Bilirubin: 0.7 mg/dL (ref 0.3–1.2)
Total Protein: 8 g/dL (ref 6.5–8.1)

## 2019-07-15 LAB — CBC
HCT: 40.1 % (ref 36.0–46.0)
Hemoglobin: 13.2 g/dL (ref 12.0–15.0)
MCH: 28.7 pg (ref 26.0–34.0)
MCHC: 32.9 g/dL (ref 30.0–36.0)
MCV: 87.2 fL (ref 80.0–100.0)
Platelets: 239 10*3/uL (ref 150–400)
RBC: 4.6 MIL/uL (ref 3.87–5.11)
RDW: 17.5 % — ABNORMAL HIGH (ref 11.5–15.5)
WBC: 6.6 10*3/uL (ref 4.0–10.5)
nRBC: 0 % (ref 0.0–0.2)

## 2019-07-15 MED ORDER — CHLORHEXIDINE GLUCONATE CLOTH 2 % EX PADS
6.0000 | MEDICATED_PAD | Freq: Every day | CUTANEOUS | Status: DC
  Administered 2019-07-15 – 2019-07-18 (×2): 6 via TOPICAL

## 2019-07-15 MED ORDER — CHLORHEXIDINE GLUCONATE CLOTH 2 % EX PADS
6.0000 | MEDICATED_PAD | Freq: Every day | CUTANEOUS | Status: DC
  Administered 2019-07-15 – 2019-07-20 (×3): 6 via TOPICAL
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
## 2019-07-15 NOTE — Progress Notes (Addendum)
[facility] Progress Note   Subjective: Patient seen and examined at bedside.  Reports abdominal pain, feels like she needs to pass gas but then it goes away.  Denies n/v/d, CP, SOB, weakness, dizziness and fatigue.  HD yesterday tolerated well.     Objective Vitals:   07/14/19 1830 07/14/19 1938 07/15/19 0054 07/15/19 0624  BP: (!) 188/80 (!) 142/96 134/69 136/70  Pulse: 74 72 62 62  Resp: 18  16 15   Temp: 98 F (36.7 C)  98.6 F (37 C) 98.3 F (36.8 C)  TempSrc: Oral  Oral Oral  SpO2: 100%  (!) 89% 92%  Weight: 60 kg     Height:       Physical Exam General:NAD, chronically ill appearing female, laying in bed Heart:RRR Lungs:CTAB Abdomen:soft, +epigastric tenderness, ND Extremities:no LE edema Dialysis Access: LU AVF +b   Filed Weights   07/14/19 1445 07/14/19 1455 07/14/19 1830  Weight: 61.4 kg 61.5 kg 60 kg    Intake/Output Summary (Last 24 hours) at 07/15/2019 1041 Last data filed at 07/15/2019 0300 Gross per 24 hour  Intake 240 ml  Output 1500 ml  Net -1260 ml    Additional Objective Labs: Basic Metabolic Panel: Recent Labs  Lab 07/10/19 0354 07/10/19 0354 07/14/19 0833 07/14/19 1717 07/15/19 0714  NA 139  --  135  --  137  K 4.4  --  5.3*  --  4.4  CL 99  --  94*  --  94*  CO2 28  --  22  --  29  GLUCOSE 70  --  95  --  134*  BUN 16  --  70*  --  22  CREATININE 6.66*   < > 15.21* 15.45* 8.30*  CALCIUM 9.0  --  9.4  --  9.3   < > = values in this interval not displayed.   Liver Function Tests: Recent Labs  Lab 07/10/19 0354 07/14/19 0833 07/15/19 0714  AST 14* 17 15  ALT 10 11 9   ALKPHOS 47 53 58  BILITOT 0.6 0.3 0.7  PROT 6.9 7.5 8.0  ALBUMIN 2.8* 3.4* 3.3*   Recent Labs  Lab 07/09/19 0940 07/14/19 0833 07/14/19 1717  LIPASE 64* 79* 82*   CBC: Recent Labs  Lab 07/09/19 0940 07/09/19 0940 07/10/19 0354 07/10/19 0354 07/14/19 0915 07/14/19 1717 07/15/19 0714  WBC 6.8   < > 6.5   < > 7.5 7.5 6.6  NEUTROABS 3.9   --   --   --  4.2  --   --   HGB 12.8   < > 12.3   < > 11.7* 11.8* 13.2  HCT 40.4   < > 38.0   < > 36.5 36.2 40.1  MCV 88.4  --  88.4  --  86.7 87.4 87.2  PLT 244   < > 186   < > 202 223 239   < > = values in this interval not displayed.   Studies/Results: DG Chest 2 View  Result Date: 07/14/2019 CLINICAL DATA:  Hypoxia, abdominal pain EXAM: CHEST - 2 VIEW COMPARISON:  07/09/2019 FINDINGS: The heart size and mediastinal contours are within normal limits. Calcific aortic knob. No focal airspace consolidation, pleural effusion, or pneumothorax. The visualized skeletal structures are unremarkable. Vascular stent within the left upper extremity. IMPRESSION: No active cardiopulmonary disease. Electronically Signed   By: Davina Poke D.O.   On: 07/14/2019 10:04    Medications:  . amLODipine  10 mg Oral Daily  .  Chlorhexidine Gluconate Cloth  6 each Topical Q0600  . cloNIDine  0.1 mg Oral BID  . heparin  5,000 Units Subcutaneous Q8H  . metoprolol succinate  25 mg Oral Daily  . Sofosbuvir-Velpatasvir  1 tablet Oral Daily    Dialysis Orders: [facility] on TTS . EDW 61.0 HD Bath  2k, 2.25 ca   Time 3.5 hrs  Heparin 2000 and 1000 mid  txm . Access LUA AVF     Hec 4 mcg IV/HD  Mircera 50 mcg  q 2wks  (last given 06/30/19) hgb >11 recent hosp    Sensipar 30mg   q day   Assessment/Plan: 1. Abd pain/Hx chronic/recurrent pancreatitis - per primary 2. ESRD - on HD TTS.  HD yesterday tolerated well.  Orders written for HD tomorrow if remains admitted. K 4.4, use 2K bath 3. Anemia of CKD- Hgb 13.2, ESA not indicated at this time, follow trends 4. Secondary hyperparathyroidism - Ca at goal. Will check phos. Continue VDRA, add binders once diet advanced.  5. HTN/volume -  BP well controlled.  Continue home meds, amlodipine, metoprolol and clonidine.  Does not appear volume overloaded on exam.    6. Nutrition - Renal diet w/fluid restrictions once advanced 7. Hx CVA 8. Hx Hep C on Epclusa 9. Hx  substance abuse  Jen Mow, PA-C [facility] Pager: [phone] 07/15/2019,10:41 AM  LOS: 0 days

## 2019-07-15 NOTE — Progress Notes (Signed)
PROGRESS NOTE    Ashley [name]  [id number] DOB: 1950/08/13 DOA: 07/14/2019 PCP: Sonia Side., FNP   Brief Narrative:  HPI: Ashley [name] is a 69 y.o. female with medical history significant of end-stage renal disease on hemodialysis Tuesday, Thursday, Saturday, recurrent pancreatitis, was recently discharged from the hospital on 07/11/2019 after being treated for an episode of pancreatitis.  After returning home, she reports worsening of abdominal pain.  Pain was similar to the pancreatitis pain that she had while she was in the hospital.  Her pain got worse when she tried to eat or drink anything.  She was unable to keep anything down by mouth.  She mainly tried to consume broth as well as Nepro drinks.  She reports that her pain has substantially worse after trying to drink anything.  Her p.o. intake is been poor the last several days.  She has not had any diarrhea.  No fever, cough, shortness of breath.  Due to her worsening abdominal pain, she came back to the emergency room for evaluation.  ED Course: On arrival to the emergency room, potassium noted to be elevated at 5.3.  Her last dialysis was on 1/30.  EKG did not show any acute changes.  Lipase mildly elevated at 79.  On her last admission, lipase was only 64, but radiographic evidence of pancreatitis was noted on MRCP.  Due to her persistent abdominal pain, diminished p.o. intake and need for dialysis, she has been referred for admission.  Assessment & Plan:   Active Problems:   Hepatitis C   Dyslipidemia   Hypertension   ESRD (end stage renal disease) on dialysis (HCC)   Pancreatitis   Hyperkalemia   Acute on chronic pancreatitis (HCC)   Acute on chronic pancreatitis/recurrent pancreatitis: Still with abdominal pain but now 7 out of 10 instead of 10 out of 10 yesterday.  No nausea.  Tolerating clear liquids.  Will advance to full liquid.  Continue current pain management.  End-stage renal disease on HD: Receives HD on TTS  schedule.  Nephrology on board.  Appreciate their help.  Hyperkalemia: Corrected after dialysis.  Essential hypertension/hypertensive urgency: Presented with blood pressure of 136/115.  Now much better and within normal range.Continue all her home medications.  Dyslipidemia: Not on any statins due to recurrent pancreatitis.  DVT prophylaxis: Heparin Code Status: Full code Family Communication: None present at bedside.  Plan of care discussed with patient in length and he verbalized understanding and agreed with it. Patient is from: Home Disposition Plan: Potentially home Barriers to discharge: Clinical improvement   Estimated body mass index is 24.19 kg/m as calculated from the following:   Height as of this encounter: 5\' 2"  (1.575 m).   Weight as of this encounter: 60 kg.      Nutritional status:               Consultants:   Nephrology  Procedures:   None  Antimicrobials:   None   Subjective: Seen and examined.  Feels better but still with abdominal pain of 8 out of 10.  No nausea and tolerating clear liquids.  Objective: Vitals:   07/14/19 1830 07/14/19 1938 07/15/19 0054 07/15/19 0624  BP: (!) 188/80 (!) 142/96 134/69 136/70  Pulse: 74 72 62 62  Resp: 18  16 15   Temp: 98 F (36.7 C)  98.6 F (37 C) 98.3 F (36.8 C)  TempSrc: Oral  Oral Oral  SpO2: 100%  (!) 89% 92%  Weight: 60  kg     Height:        Intake/Output Summary (Last 24 hours) at 07/15/2019 1151 Last data filed at 07/15/2019 0300 Gross per 24 hour  Intake 240 ml  Output 1500 ml  Net -1260 ml   Filed Weights   07/14/19 1445 07/14/19 1455 07/14/19 1830  Weight: 61.4 kg 61.5 kg 60 kg    Examination:  General exam: Appears calm and comfortable  Respiratory system: Clear to auscultation. Respiratory effort normal. Cardiovascular system: S1 & S2 heard, RRR. No JVD, murmurs, rubs, gallops or clicks. No pedal edema. Gastrointestinal system: Abdomen is nondistended, soft and  epigastric and right upper quadrant tenderness. No organomegaly or masses felt. Normal bowel sounds heard. Central nervous system: Alert and oriented. No focal neurological deficits. Extremities: Symmetric 5 x 5 power. Skin: No rashes, lesions or ulcers Psychiatry: Judgement and insight appear normal. Mood & affect flat.   Data Reviewed: I have personally reviewed following labs and imaging studies  CBC: Recent Labs  Lab 07/09/19 0940 07/10/19 0354 07/14/19 0915 07/14/19 1717 07/15/19 0714  WBC 6.8 6.5 7.5 7.5 6.6  NEUTROABS 3.9  --  4.2  --   --   HGB 12.8 12.3 11.7* 11.8* 13.2  HCT 40.4 38.0 36.5 36.2 40.1  MCV 88.4 88.4 86.7 87.4 87.2  PLT 244 186 202 223 [id number]   Basic Metabolic Panel: Recent Labs  Lab 07/09/19 0940 07/10/19 0354 07/14/19 0833 07/14/19 1717 07/15/19 0714  NA 135 139 135  --  137  K 4.4 4.4 5.3*  --  4.4  CL 92* 99 94*  --  94*  CO2 28 28 22   --  29  GLUCOSE 140* 70 95  --  134*  BUN 30* 16 70*  --  22  CREATININE 9.30* 6.66* 15.21* 15.45* 8.30*  CALCIUM 9.6 9.0 9.4  --  9.3   GFR: Estimated Creatinine Clearance: 5 mL/min (A) (by C-G formula based on SCr of 8.3 mg/dL (H)). Liver Function Tests: Recent Labs  Lab 07/09/19 0940 07/10/19 0354 07/14/19 0833 07/15/19 0714  AST 15 14* 17 15  ALT 9 10 11 9   ALKPHOS 53 47 53 58  BILITOT 0.1* 0.6 0.3 0.7  PROT 7.4 6.9 7.5 8.0  ALBUMIN 3.1* 2.8* 3.4* 3.3*   Recent Labs  Lab 07/09/19 0940 07/14/19 0833 07/14/19 1717  LIPASE 64* 79* 82*   No results for input(s): AMMONIA in the last 168 hours. Coagulation Profile: No results for input(s): INR, PROTIME in the last 168 hours. Cardiac Enzymes: No results for input(s): CKTOTAL, CKMB, CKMBINDEX, TROPONINI in the last 168 hours. BNP (last 3 results) No results for input(s): PROBNP in the last 8760 hours. HbA1C: No results for input(s): HGBA1C in the last 72 hours. CBG: No results for input(s): GLUCAP in the last 168 hours. Lipid Profile: No  results for input(s): CHOL, HDL, LDLCALC, TRIG, CHOLHDL, LDLDIRECT in the last 72 hours. Thyroid Function Tests: No results for input(s): TSH, T4TOTAL, FREET4, T3FREE, THYROIDAB in the last 72 hours. Anemia Panel: No results for input(s): VITAMINB12, FOLATE, FERRITIN, TIBC, IRON, RETICCTPCT in the last 72 hours. Sepsis Labs: No results for input(s): PROCALCITON, LATICACIDVEN in the last 168 hours.  Recent Results (from the past 240 hour(s))  Respiratory Panel by RT PCR (Flu A&B, Covid) - Nasopharyngeal Swab     Status: None   Collection Time: 07/09/19  2:38 PM   Specimen: Nasopharyngeal Swab  Result Value Ref Range Status   SARS Coronavirus 2 by  RT PCR NEGATIVE NEGATIVE Final    Comment: (NOTE) SARS-CoV-2 target nucleic acids are NOT DETECTED. The SARS-CoV-2 RNA is generally detectable in upper respiratoy specimens during the acute phase of infection. The lowest concentration of SARS-CoV-2 viral copies this assay can detect is 131 copies/mL. A negative result does not preclude SARS-Cov-2 infection and should not be used as the sole basis for treatment or other patient management decisions. A negative result may occur with  improper specimen collection/handling, submission of specimen other than nasopharyngeal swab, presence of viral mutation(s) within the areas targeted by this assay, and inadequate number of viral copies (<131 copies/mL). A negative result must be combined with clinical observations, patient history, and epidemiological information. The expected result is Negative. Fact Sheet for Patients:  PinkCheek.be Fact Sheet for Healthcare Providers:  GravelBags.it This test is not yet ap proved or cleared by the [place] FDA and  has been authorized for detection and/or diagnosis of SARS-CoV-2 by FDA under an Emergency Use Authorization (EUA). This EUA will remain  in effect (meaning this test can be used) for  the duration of the COVID-19 declaration under Section 564(b)(1) of the Act, 21 U.S.C. section 360bbb-3(b)(1), unless the authorization is terminated or revoked sooner.    Influenza A by PCR NEGATIVE NEGATIVE Final   Influenza B by PCR NEGATIVE NEGATIVE Final    Comment: (NOTE) The Xpert Xpress SARS-CoV-2/FLU/RSV assay is intended as an aid in  the diagnosis of influenza from Nasopharyngeal swab specimens and  should not be used as a sole basis for treatment. Nasal washings and  aspirates are unacceptable for Xpert Xpress SARS-CoV-2/FLU/RSV  testing. Fact Sheet for Patients: PinkCheek.be Fact Sheet for Healthcare Providers: GravelBags.it This test is not yet approved or cleared by the [place] FDA and  has been authorized for detection and/or diagnosis of SARS-CoV-2 by  FDA under an Emergency Use Authorization (EUA). This EUA will remain  in effect (meaning this test can be used) for the duration of the  Covid-19 declaration under Section 564(b)(1) of the Act, 21  U.S.C. section 360bbb-3(b)(1), unless the authorization is  terminated or revoked. Performed at [facility], [place] [address] [place], [place] [address]   SARS CORONAVIRUS 2 (TAT 6-24 HRS) Nasopharyngeal Nasopharyngeal Swab     Status: None   Collection Time: 07/14/19  1:02 PM   Specimen: Nasopharyngeal Swab  Result Value Ref Range Status   SARS Coronavirus 2 NEGATIVE NEGATIVE Final    Comment: (NOTE) SARS-CoV-2 target nucleic acids are NOT DETECTED. The SARS-CoV-2 RNA is generally detectable in upper and lower respiratory specimens during the acute phase of infection. Negative results do not preclude SARS-CoV-2 infection, do not rule out co-infections with other pathogens, and should not be used as the sole basis for treatment or other patient management decisions. Negative results must be combined with clinical observations, patient history, and  epidemiological information. The expected result is Negative. Fact Sheet for Patients: SugarRoll.be Fact Sheet for Healthcare Providers: https://www.woods-mathews.com/ This test is not yet approved or cleared by the [place] FDA and  has been authorized for detection and/or diagnosis of SARS-CoV-2 by FDA under an Emergency Use Authorization (EUA). This EUA will remain  in effect (meaning this test can be used) for the duration of the COVID-19 declaration under Section 56 4(b)(1) of the Act, 21 U.S.C. section 360bbb-3(b)(1), unless the authorization is terminated or revoked sooner. Performed at [facility], [place] [address], [place], [place] [address]  Radiology Studies: DG Chest 2 View  Result Date: 07/14/2019 CLINICAL DATA:  Hypoxia, abdominal pain EXAM: CHEST - 2 VIEW COMPARISON:  07/09/2019 FINDINGS: The heart size and mediastinal contours are within normal limits. Calcific aortic knob. No focal airspace consolidation, pleural effusion, or pneumothorax. The visualized skeletal structures are unremarkable. Vascular stent within the left upper extremity. IMPRESSION: No active cardiopulmonary disease. Electronically Signed   By: Davina Poke D.O.   On: 07/14/2019 10:04    Scheduled Meds: . amLODipine  10 mg Oral Daily  . Chlorhexidine Gluconate Cloth  6 each Topical Q0600  . Chlorhexidine Gluconate Cloth  6 each Topical Q0600  . cloNIDine  0.1 mg Oral BID  . heparin  5,000 Units Subcutaneous Q8H  . metoprolol succinate  25 mg Oral Daily  . Sofosbuvir-Velpatasvir  1 tablet Oral Daily   Continuous Infusions:   LOS: 0 days   Time spent: 32 minutes   Darliss Cheney, MD [facility]  07/15/2019, 11:51 AM   To contact the attending provider between 7A-7P or the covering provider during after hours 7P-7A, please log into the web site [email or web address].
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
## 2019-07-15 NOTE — Evaluation (Signed)
Physical Therapy Evaluation Patient Details Name: Ashley [name] MRN: [id number] DOB: April 25, 1951 Today's Date: 07/15/2019   History of Present Illness  Pt is a 69 y/o female admitted secondary to worsening abdominal pain, likely from pancreatitis. Pt with recent hospitalization for pancreatitis. PMH includes HTN, ESRD on HD TTS, CVA, and hep C.   Clinical Impression  Pt admitted secondary to problem above with deficits below. Pt unsteady without AD and required min A. Pt with improved steadiness with use of RW. Educated about use of RW at home to increase safety. Will continue to follow acutely to maximize functional mobility independence and safety.     Follow Up Recommendations Home health PT    Equipment Recommendations  Rolling walker with 5" wheels    Recommendations for Other Services       Precautions / Restrictions Precautions Precautions: Fall Restrictions Weight Bearing Restrictions: No      Mobility  Bed Mobility Overal bed mobility: Modified Independent                Transfers Overall transfer level: Needs assistance Equipment used: None;Rolling walker (2 wheeled) Transfers: Sit to/from Stand Sit to Stand: Min assist;Min guard         General transfer comment: Pt with unsteadiness when standing without AD. Required min A for steadying assist. Min guard A required when using RW.   Ambulation/Gait Ambulation/Gait assistance: Min guard Gait Distance (Feet): 100 Feet Assistive device: Rolling walker (2 wheeled) Gait Pattern/deviations: Step-through pattern;Decreased stride length Gait velocity: Decreased   General Gait Details: Improved steadiness with RW. Min guard for safety. Educated about using RW at home to increase safety.   Stairs            Wheelchair Mobility    Modified Rankin (Stroke Patients Only)       Balance Overall balance assessment: Needs assistance Sitting-balance support: No upper extremity supported;Feet  supported Sitting balance-Leahy Scale: Fair     Standing balance support: Bilateral upper extremity supported;During functional activity Standing balance-Leahy Scale: Poor Standing balance comment: Reliant on BUE support                              Pertinent Vitals/Pain Pain Assessment: Faces Faces Pain Scale: Hurts a little bit Pain Location: abdomen Pain Descriptors / Indicators: Grimacing Pain Intervention(s): Limited activity within patient's tolerance;Monitored during session;Repositioned    Home Living Family/patient expects to be discharged to:: Private residence Living Arrangements: Alone Available Help at Discharge: Friend(s);Family;Available PRN/intermittently Type of Home: Apartment Home Access: Elevator     Home Layout: One level Home Equipment: None      Prior Function Level of Independence: Independent         Comments: Uses [facility] for transportation to/from HD.      Hand Dominance        Extremity/Trunk Assessment   Upper Extremity Assessment Upper Extremity Assessment: Overall WFL for tasks assessed    Lower Extremity Assessment Lower Extremity Assessment: Generalized weakness    Cervical / Trunk Assessment Cervical / Trunk Assessment: Normal  Communication   Communication: No difficulties  Cognition Arousal/Alertness: Awake/alert Behavior During Therapy: WFL for tasks assessed/performed Overall Cognitive Status: Within Functional Limits for tasks assessed                                        General Comments  Exercises     Assessment/Plan    PT Assessment Patient needs continued PT services  PT Problem List Decreased strength;Decreased balance;Decreased mobility;Decreased knowledge of use of DME       PT Treatment Interventions Gait training;DME instruction;Stair training;Functional mobility training;Therapeutic activities;Therapeutic exercise;Balance training;Patient/family education    PT  Goals (Current goals can be found in the Care Plan section)  Acute Rehab PT Goals Patient Stated Goal: to feel better PT Goal Formulation: With patient Time For Goal Achievement: 07/29/19 Potential to Achieve Goals: Good    Frequency Min 3X/week   Barriers to discharge Decreased caregiver support      Co-evaluation               AM-PAC PT "6 Clicks" Mobility  Outcome Measure Help needed turning from your back to your side while in a flat bed without using bedrails?: None Help needed moving from lying on your back to sitting on the side of a flat bed without using bedrails?: None Help needed moving to and from a bed to a chair (including a wheelchair)?: A Little Help needed standing up from a chair using your arms (e.g., wheelchair or bedside chair)?: A Little Help needed to walk in hospital room?: A Little Help needed climbing 3-5 steps with a railing? : A Lot 6 Click Score: 19    End of Session Equipment Utilized During Treatment: Gait belt Activity Tolerance: Patient tolerated treatment well Patient left: in bed;with call bell/phone within reach;with bed alarm set Nurse Communication: Mobility status PT Visit Diagnosis: Unsteadiness on feet (R26.81);Muscle weakness (generalized) (M62.81)    Time: [id number] PT Time Calculation (min) (ACUTE ONLY): 17 min   Charges:   PT Evaluation $PT Eval Low Complexity: 1 Low          Lou Miner, DPT  Acute Rehabilitation Services  Pager: [phone] Office: [phone]   Rudean Hitt 07/15/2019, 2:31 PM

## 2019-07-16 LAB — CBC
HCT: 38.4 % (ref 36.0–46.0)
Hemoglobin: 12.3 g/dL (ref 12.0–15.0)
MCH: 28 pg (ref 26.0–34.0)
MCHC: 32 g/dL (ref 30.0–36.0)
MCV: 87.3 fL (ref 80.0–100.0)
Platelets: 273 10*3/uL (ref 150–400)
RBC: 4.4 MIL/uL (ref 3.87–5.11)
RDW: 17.1 % — ABNORMAL HIGH (ref 11.5–15.5)
WBC: 6 10*3/uL (ref 4.0–10.5)
nRBC: 0 % (ref 0.0–0.2)

## 2019-07-16 LAB — RENAL FUNCTION PANEL
Albumin: 3.1 g/dL — ABNORMAL LOW (ref 3.5–5.0)
Anion gap: 16 — ABNORMAL HIGH (ref 5–15)
BUN: 32 mg/dL — ABNORMAL HIGH (ref 8–23)
CO2: 27 mmol/L (ref 22–32)
Calcium: 9.5 mg/dL (ref 8.9–10.3)
Chloride: 91 mmol/L — ABNORMAL LOW (ref 98–111)
Creatinine, Ser: 10.51 mg/dL — ABNORMAL HIGH (ref 0.44–1.00)
GFR calc Af Amer: 4 mL/min — ABNORMAL LOW (ref >60)
GFR calc non Af Amer: 3 mL/min — ABNORMAL LOW (ref >60)
Glucose, Bld: 156 mg/dL — ABNORMAL HIGH (ref 70–99)
Phosphorus: 8.6 mg/dL — ABNORMAL HIGH (ref 2.5–4.6)
Potassium: 4.2 mmol/L (ref 3.5–5.1)
Sodium: 134 mmol/L — ABNORMAL LOW (ref 135–145)

## 2019-07-16 MED ORDER — HEPARIN SODIUM (PORCINE) 1000 UNIT/ML IJ SOLN
INTRAMUSCULAR | Status: AC
  Administered 2019-07-16: 2000 [IU]
  Filled 2019-07-16: qty 3

## 2019-07-16 NOTE — Progress Notes (Signed)
[facility] Progress Note   Subjective: c/o ongoing quite severe abd pain and inability to tolerate po intake  Objective Vitals:   07/16/19 0554 07/16/19 0719 07/16/19 0732 07/16/19 0745  BP: (!) 173/77 (!) 153/76 (!) 144/78 128/80  Pulse: (!) 57 61 61 62  Resp: 18 18    Temp: 98 F (36.7 C) 98.1 F (36.7 C)    TempSrc: Oral Oral    SpO2: 96% 99%    Weight:  60.5 kg    Height:       Physical Exam General:NAD, chronically ill appearing female, laying in bed Heart:RRR Lungs:CTAB Abdomen:soft, +epigastric tenderness, ND Extremities:no LE edema Dialysis Access: LU AVF +b   Filed Weights   07/14/19 1455 07/14/19 1830 07/16/19 0719  Weight: 61.5 kg 60 kg 60.5 kg    Intake/Output Summary (Last 24 hours) at 07/16/2019 0909 Last data filed at 07/15/2019 2200 Gross per 24 hour  Intake 120 ml  Output --  Net 120 ml    Additional Objective Labs: Basic Metabolic Panel: Recent Labs  Lab 07/14/19 0833 07/14/19 0833 07/14/19 1717 07/15/19 0714 07/16/19 0744  NA 135  --   --  137 134*  K 5.3*  --   --  4.4 4.2  CL 94*  --   --  94* 91*  CO2 22  --   --  29 27  GLUCOSE 95  --   --  134* 156*  BUN 70*  --   --  22 32*  CREATININE 15.21*   < > 15.45* 8.30* 10.51*  CALCIUM 9.4  --   --  9.3 9.5  PHOS  --   --   --   --  8.6*   < > = values in this interval not displayed.   Liver Function Tests: Recent Labs  Lab 07/10/19 0354 07/10/19 0354 07/14/19 0833 07/15/19 0714 07/16/19 0744  AST 14*  --  17 15  --   ALT 10  --  11 9  --   ALKPHOS 47  --  53 58  --   BILITOT 0.6  --  0.3 0.7  --   PROT 6.9  --  7.5 8.0  --   ALBUMIN 2.8*   < > 3.4* 3.3* 3.1*   < > = values in this interval not displayed.   Recent Labs  Lab 07/09/19 0940 07/14/19 0833 07/14/19 1717  LIPASE 64* 79* 82*   CBC: Recent Labs  Lab 07/09/19 0940 07/09/19 0940 07/10/19 0354 07/10/19 0354 07/14/19 0915 07/14/19 0915 07/14/19 1717 07/15/19 0714 07/16/19 0743  WBC 6.8    < > 6.5   < > 7.5   < > 7.5 6.6 6.0  NEUTROABS 3.9  --   --   --  4.2  --   --   --   --   HGB 12.8   < > 12.3   < > 11.7*   < > 11.8* 13.2 12.3  HCT 40.4   < > 38.0   < > 36.5   < > 36.2 40.1 38.4  MCV 88.4   < > 88.4  --  86.7  --  87.4 87.2 87.3  PLT 244   < > 186   < > 202   < > 223 239 273   < > = values in this interval not displayed.   Studies/Results: DG Chest 2 View  Result Date: 07/14/2019 CLINICAL DATA:  Hypoxia, abdominal pain EXAM: CHEST - 2 VIEW  COMPARISON:  07/09/2019 FINDINGS: The heart size and mediastinal contours are within normal limits. Calcific aortic knob. No focal airspace consolidation, pleural effusion, or pneumothorax. The visualized skeletal structures are unremarkable. Vascular stent within the left upper extremity. IMPRESSION: No active cardiopulmonary disease. Electronically Signed   By: Davina Poke D.O.   On: 07/14/2019 10:04    Medications:  . heparin      . amLODipine  10 mg Oral Daily  . Chlorhexidine Gluconate Cloth  6 each Topical Q0600  . Chlorhexidine Gluconate Cloth  6 each Topical Q0600  . cloNIDine  0.1 mg Oral BID  . heparin  5,000 Units Subcutaneous Q8H  . metoprolol succinate  25 mg Oral Daily  . Sofosbuvir-Velpatasvir  1 tablet Oral Daily    Dialysis Orders: [place] on TTS . EDW 61.0 HD Bath  2k, 2.25 ca   Time 3.5 hrs  Heparin 2000 and 1000 mid  txm . Access LUA AVF     Hec 4 mcg IV/HD  Mircera 50 mcg  q 2wks  (last given 06/30/19) hgb >11 recent hosp    Sensipar 30mg   q day   Assessment/Plan: 1. Abd pain/Hx chronic/recurrent pancreatitis - seems out of proportion to lipase level; if pain persists would consider reinvolving GI. 2. ESRD - on HD TTS.  HD today.  No UF due to poor po intake.  Standing post weight, may need new EDW at d/c as below EDW currently.  3. Anemia of CKD- Hgb 13.2, ESA not indicated at this time, follow trends 4. Secondary hyperparathyroidism - Ca at goal. Phos 8.6. Continue VDRA, add binders once diet  advanced.  5. HTN/volume -  BP generally well controlled.  Continue home meds, amlodipine, metoprolol and clonidine.  Does not appear volume overloaded on exam.    6. Nutrition - Renal diet w/fluid restrictions once advanced 7. Hx CVA 8. Hx Hep C on Epclusa 9. Hx substance abuse  Jannifer Hick MD [facility] Pager [phone]

## 2019-07-16 NOTE — Progress Notes (Signed)
Patient returned from [place], awake, c/o abd. Pain10/10. Pain med given. Lunch tray order. VS in doc flow sheet. NO other distress voice.

## 2019-07-16 NOTE — Progress Notes (Signed)
PROGRESS NOTE    Ashley [name]  [id number] DOB: 12/22/1950 DOA: 07/14/2019 PCP: Sonia Side., FNP   Brief Narrative:  HPI: Ashley [name] is a 69 y.o. female with medical history significant of end-stage renal disease on hemodialysis Tuesday, Thursday, Saturday, recurrent pancreatitis, was recently discharged from the hospital on 07/11/2019 after being treated for an episode of pancreatitis.  After returning home, she reports worsening of abdominal pain.  Pain was similar to the pancreatitis pain that she had while she was in the hospital.  Her pain got worse when she tried to eat or drink anything.  She was unable to keep anything down by mouth.  She mainly tried to consume broth as well as Nepro drinks.  She reports that her pain has substantially worse after trying to drink anything.  Her p.o. intake is been poor the last several days.  She has not had any diarrhea.  No fever, cough, shortness of breath.  Due to her worsening abdominal pain, she came back to the emergency room for evaluation.  ED Course: On arrival to the emergency room, potassium noted to be elevated at 5.3.  Her last dialysis was on 1/30.  EKG did not show any acute changes.  Lipase mildly elevated at 79.  On her last admission, lipase was only 64, but radiographic evidence of pancreatitis was noted on MRCP.  Due to her persistent abdominal pain, diminished p.o. intake and need for dialysis, she has been referred for admission.  Assessment & Plan:   Active Problems:   Hepatitis C   Dyslipidemia   Hypertension   ESRD (end stage renal disease) on dialysis (HCC)   Pancreatitis   Hyperkalemia   Acute on chronic pancreatitis (HCC)   Acute on chronic pancreatitis/recurrent pancreatitis: Continues to complain of 7 out of 10 abdominal pain but has been tolerating full liquid diet without any nausea.  Also has generalized abdominal tenderness more so in the epigastrium.  Will advance to soft diet.  Wonder if her pain is  going to be chronic now that she has recurrent and chronic pancreatitis.  Potentially discharge home tomorrow if continues to tolerate soft diet.  End-stage renal disease on HD: Receives HD on TTS schedule.  Nephrology on board.  Appreciate their help.  Hyperkalemia: Corrected after dialysis.  Essential hypertension/hypertensive urgency: Presented with blood pressure of 136/115.  Now much better and within normal range.Continue all her home medications.  Dyslipidemia: Not on any statins due to recurrent pancreatitis.  DVT prophylaxis: Heparin Code Status: Full code Family Communication: None present at bedside.  Plan of care discussed with patient in length and he verbalized understanding and agreed with it. Patient is from: Home Disposition Plan: Home with home health potentially tomorrow Barriers to discharge: Clinical improvement   Estimated body mass index is 24.4 kg/m as calculated from the following:   Height as of this encounter: 5\' 2"  (1.575 m).   Weight as of this encounter: 60.5 kg.      Nutritional status:               Consultants:   Nephrology  Procedures:   None  Antimicrobials:   None   Subjective: Seen and examined in dialysis today.  Continues to complain of 7 out of 10 abdominal pain but no nausea and tolerating full liquid diet.  Objective: Vitals:   07/16/19 0915 07/16/19 0945 07/16/19 1015 07/16/19 1045  BP: (!) 142/76 (!) 153/75 (!) 164/85 (!) 165/88  Pulse: 69 72  75 76  Resp:      Temp:      TempSrc:      SpO2:      Weight:      Height:        Intake/Output Summary (Last 24 hours) at 07/16/2019 1204 Last data filed at 07/15/2019 2200 Gross per 24 hour  Intake 120 ml  Output --  Net 120 ml   Filed Weights   07/14/19 1455 07/14/19 1830 07/16/19 0719  Weight: 61.5 kg 60 kg 60.5 kg    Examination:  General exam: Appears calm and comfortable  Respiratory system: Clear to auscultation. Respiratory effort  normal. Cardiovascular system: S1 & S2 heard, RRR. No JVD, murmurs, rubs, gallops or clicks. No pedal edema. Gastrointestinal system: Abdomen is nondistended, soft and generalized abdominal tenderness, more pronounced in epigastric region. No organomegaly or masses felt. Normal bowel sounds heard. Central nervous system: Alert and oriented. No focal neurological deficits. Extremities: Symmetric 5 x 5 power. Skin: No rashes, lesions or ulcers.  Psychiatry: Judgement and insight appear poor, mood & affect flat.   Data Reviewed: I have personally reviewed following labs and imaging studies  CBC: Recent Labs  Lab 07/10/19 0354 07/14/19 0915 07/14/19 1717 07/15/19 0714 07/16/19 0743  WBC 6.5 7.5 7.5 6.6 6.0  NEUTROABS  --  4.2  --   --   --   HGB 12.3 11.7* 11.8* 13.2 12.3  HCT 38.0 36.5 36.2 40.1 38.4  MCV 88.4 86.7 87.4 87.2 87.3  PLT 186 202 223 239 [id number]   Basic Metabolic Panel: Recent Labs  Lab 07/10/19 0354 07/14/19 0833 07/14/19 1717 07/15/19 0714 07/16/19 0744  NA 139 135  --  137 134*  K 4.4 5.3*  --  4.4 4.2  CL 99 94*  --  94* 91*  CO2 28 22  --  29 27  GLUCOSE 70 95  --  134* 156*  BUN 16 70*  --  22 32*  CREATININE 6.66* 15.21* 15.45* 8.30* 10.51*  CALCIUM 9.0 9.4  --  9.3 9.5  PHOS  --   --   --   --  8.6*   GFR: Estimated Creatinine Clearance: 4.3 mL/min (A) (by C-G formula based on SCr of 10.51 mg/dL (H)). Liver Function Tests: Recent Labs  Lab 07/10/19 0354 07/14/19 0833 07/15/19 0714 07/16/19 0744  AST 14* 17 15  --   ALT 10 11 9   --   ALKPHOS 47 53 58  --   BILITOT 0.6 0.3 0.7  --   PROT 6.9 7.5 8.0  --   ALBUMIN 2.8* 3.4* 3.3* 3.1*   Recent Labs  Lab 07/14/19 0833 07/14/19 1717  LIPASE 79* 82*   No results for input(s): AMMONIA in the last 168 hours. Coagulation Profile: No results for input(s): INR, PROTIME in the last 168 hours. Cardiac Enzymes: No results for input(s): CKTOTAL, CKMB, CKMBINDEX, TROPONINI in the last 168 hours. BNP  (last 3 results) No results for input(s): PROBNP in the last 8760 hours. HbA1C: No results for input(s): HGBA1C in the last 72 hours. CBG: No results for input(s): GLUCAP in the last 168 hours. Lipid Profile: No results for input(s): CHOL, HDL, LDLCALC, TRIG, CHOLHDL, LDLDIRECT in the last 72 hours. Thyroid Function Tests: No results for input(s): TSH, T4TOTAL, FREET4, T3FREE, THYROIDAB in the last 72 hours. Anemia Panel: No results for input(s): VITAMINB12, FOLATE, FERRITIN, TIBC, IRON, RETICCTPCT in the last 72 hours. Sepsis Labs: No results for input(s): PROCALCITON, LATICACIDVEN in the  last 168 hours.  Recent Results (from the past 240 hour(s))  Respiratory Panel by RT PCR (Flu A&B, Covid) - Nasopharyngeal Swab     Status: None   Collection Time: 07/09/19  2:38 PM   Specimen: Nasopharyngeal Swab  Result Value Ref Range Status   SARS Coronavirus 2 by RT PCR NEGATIVE NEGATIVE Final    Comment: (NOTE) SARS-CoV-2 target nucleic acids are NOT DETECTED. The SARS-CoV-2 RNA is generally detectable in upper respiratoy specimens during the acute phase of infection. The lowest concentration of SARS-CoV-2 viral copies this assay can detect is 131 copies/mL. A negative result does not preclude SARS-Cov-2 infection and should not be used as the sole basis for treatment or other patient management decisions. A negative result may occur with  improper specimen collection/handling, submission of specimen other than nasopharyngeal swab, presence of viral mutation(s) within the areas targeted by this assay, and inadequate number of viral copies (<131 copies/mL). A negative result must be combined with clinical observations, patient history, and epidemiological information. The expected result is Negative. Fact Sheet for Patients:  PinkCheek.be Fact Sheet for Healthcare Providers:  GravelBags.it This test is not yet ap proved or cleared  by the [place] FDA and  has been authorized for detection and/or diagnosis of SARS-CoV-2 by FDA under an Emergency Use Authorization (EUA). This EUA will remain  in effect (meaning this test can be used) for the duration of the COVID-19 declaration under Section 564(b)(1) of the Act, 21 U.S.C. section 360bbb-3(b)(1), unless the authorization is terminated or revoked sooner.    Influenza A by PCR NEGATIVE NEGATIVE Final   Influenza B by PCR NEGATIVE NEGATIVE Final    Comment: (NOTE) The Xpert Xpress SARS-CoV-2/FLU/RSV assay is intended as an aid in  the diagnosis of influenza from Nasopharyngeal swab specimens and  should not be used as a sole basis for treatment. Nasal washings and  aspirates are unacceptable for Xpert Xpress SARS-CoV-2/FLU/RSV  testing. Fact Sheet for Patients: PinkCheek.be Fact Sheet for Healthcare Providers: GravelBags.it This test is not yet approved or cleared by the [place] FDA and  has been authorized for detection and/or diagnosis of SARS-CoV-2 by  FDA under an Emergency Use Authorization (EUA). This EUA will remain  in effect (meaning this test can be used) for the duration of the  Covid-19 declaration under Section 564(b)(1) of the Act, 21  U.S.C. section 360bbb-3(b)(1), unless the authorization is  terminated or revoked. Performed at [facility], [place] [address], [place], [place] [address]   SARS CORONAVIRUS 2 (TAT 6-24 HRS) Nasopharyngeal Nasopharyngeal Swab     Status: None   Collection Time: 07/14/19  1:02 PM   Specimen: Nasopharyngeal Swab  Result Value Ref Range Status   SARS Coronavirus 2 NEGATIVE NEGATIVE Final    Comment: (NOTE) SARS-CoV-2 target nucleic acids are NOT DETECTED. The SARS-CoV-2 RNA is generally detectable in upper and lower respiratory specimens during the acute phase of infection. Negative results do not preclude SARS-CoV-2 infection, do not rule  out co-infections with other pathogens, and should not be used as the sole basis for treatment or other patient management decisions. Negative results must be combined with clinical observations, patient history, and epidemiological information. The expected result is Negative. Fact Sheet for Patients: SugarRoll.be Fact Sheet for Healthcare Providers: https://www.woods-mathews.com/ This test is not yet approved or cleared by the [place] FDA and  has been authorized for detection and/or diagnosis of SARS-CoV-2 by FDA under an Emergency Use Authorization (EUA). This EUA  will remain  in effect (meaning this test can be used) for the duration of the COVID-19 declaration under Section 56 4(b)(1) of the Act, 21 U.S.C. section 360bbb-3(b)(1), unless the authorization is terminated or revoked sooner. Performed at [facility], [place] [address] [place], [place] [address]       Radiology Studies: No results found.  Scheduled Meds: . amLODipine  10 mg Oral Daily  . Chlorhexidine Gluconate Cloth  6 each Topical Q0600  . Chlorhexidine Gluconate Cloth  6 each Topical Q0600  . cloNIDine  0.1 mg Oral BID  . heparin  5,000 Units Subcutaneous Q8H  . metoprolol succinate  25 mg Oral Daily  . Sofosbuvir-Velpatasvir  1 tablet Oral Daily   Continuous Infusions:   LOS: 1 day   Time spent: 28 minutes   Darliss Cheney, MD [facility]  07/16/2019, 12:04 PM   To contact the attending provider between 7A-7P or the covering provider during after hours 7P-7A, please log into the web site [email or web address].

## 2019-07-17 LAB — CBC
HCT: 38.2 % (ref 36.0–46.0)
Hemoglobin: 12.3 g/dL (ref 12.0–15.0)
MCH: 28 pg (ref 26.0–34.0)
MCHC: 32.2 g/dL (ref 30.0–36.0)
MCV: 87 fL (ref 80.0–100.0)
Platelets: 265 10*3/uL (ref 150–400)
RBC: 4.39 MIL/uL (ref 3.87–5.11)
RDW: 16.7 % — ABNORMAL HIGH (ref 11.5–15.5)
WBC: 5.8 10*3/uL (ref 4.0–10.5)
nRBC: 0 % (ref 0.0–0.2)

## 2019-07-17 LAB — BASIC METABOLIC PANEL
Anion gap: 12 (ref 5–15)
BUN: 12 mg/dL (ref 8–23)
CO2: 27 mmol/L (ref 22–32)
Calcium: 9.7 mg/dL (ref 8.9–10.3)
Chloride: 98 mmol/L (ref 98–111)
Creatinine, Ser: 6.51 mg/dL — ABNORMAL HIGH (ref 0.44–1.00)
GFR calc Af Amer: 7 mL/min — ABNORMAL LOW (ref >60)
GFR calc non Af Amer: 6 mL/min — ABNORMAL LOW (ref >60)
Glucose, Bld: 90 mg/dL (ref 70–99)
Potassium: 4.3 mmol/L (ref 3.5–5.1)
Sodium: 137 mmol/L (ref 135–145)

## 2019-07-17 MED ORDER — PANTOPRAZOLE SODIUM 40 MG IV SOLR
40.0000 mg | Freq: Two times a day (BID) | INTRAVENOUS | Status: DC
  Administered 2019-07-17 – 2019-07-18 (×4): 40 mg via INTRAVENOUS
  Filled 2019-07-17 (×4): qty 40

## 2019-07-17 NOTE — Progress Notes (Signed)
[facility] Progress Note   Subjective:   Patient seen and examined at bedside.  Continues to have abdominal pain and nausea with little improvement.  Minimal PO intake tolerated yesterday. Going to try to eat something again today.  Denies SOB, diarrhea, edema, and fatigue.   Objective Vitals:   07/16/19 1758 07/17/19 0005 07/17/19 0552 07/17/19 0926  BP: (!) 141/68 (!) 146/64 (!) 182/149 (!) 156/67  Pulse: 69 (!) 59 60 61  Resp: 18 15 15 16   Temp: 98.3 F (36.8 C) 98.4 F (36.9 C) 98.1 F (36.7 C) 98.3 F (36.8 C)  TempSrc: Oral Oral Oral Oral  SpO2: 100% 91% 94% 100%  Weight:      Height:       Physical Exam General:NAD, chronically ill appearing female, laying in bed Heart:RRR Lungs:CTAB Abdomen:soft, +epigastric tenderness, ND Extremities:no LE edema Dialysis Access: LU AVF +b/t   Filed Weights   07/14/19 1830 07/16/19 0719 07/16/19 1130  Weight: 60 kg 60.5 kg 59.7 kg    Intake/Output Summary (Last 24 hours) at 07/17/2019 1214 Last data filed at 07/17/2019 0900 Gross per 24 hour  Intake 840 ml  Output --  Net 840 ml    Additional Objective Labs: Basic Metabolic Panel: Recent Labs  Lab 07/15/19 0714 07/16/19 0744 07/17/19 0620  NA 137 134* 137  K 4.4 4.2 4.3  CL 94* 91* 98  CO2 29 27 27   GLUCOSE 134* 156* 90  BUN 22 32* 12  CREATININE 8.30* 10.51* 6.51*  CALCIUM 9.3 9.5 9.7  PHOS  --  8.6*  --    Liver Function Tests: Recent Labs  Lab 07/14/19 0833 07/15/19 0714 07/16/19 0744  AST 17 15  --   ALT 11 9  --   ALKPHOS 53 58  --   BILITOT 0.3 0.7  --   PROT 7.5 8.0  --   ALBUMIN 3.4* 3.3* 3.1*   Recent Labs  Lab 07/14/19 0833 07/14/19 1717  LIPASE 79* 82*   CBC: Recent Labs  Lab 07/14/19 0915 07/14/19 0915 07/14/19 1717 07/14/19 1717 07/15/19 0714 07/16/19 0743 07/17/19 0620  WBC 7.5   < > 7.5   < > 6.6 6.0 5.8  NEUTROABS 4.2  --   --   --   --   --   --   HGB 11.7*   < > 11.8*   < > 13.2 12.3 12.3  HCT 36.5   <  > 36.2   < > 40.1 38.4 38.2  MCV 86.7  --  87.4  --  87.2 87.3 87.0  PLT 202   < > 223   < > 239 273 265   < > = values in this interval not displayed.   Blood Culture    Component Value Date/Time   SDES URINE, RANDOM 06/20/2017 2222   SPECREQUEST NONE 06/20/2017 2222   CULT MULTIPLE SPECIES PRESENT, SUGGEST RECOLLECTION (A) 06/20/2017 2222   REPTSTATUS 06/22/2017 FINAL 06/20/2017 2222    Medications:  . amLODipine  10 mg Oral Daily  . Chlorhexidine Gluconate Cloth  6 each Topical Q0600  . Chlorhexidine Gluconate Cloth  6 each Topical Q0600  . cloNIDine  0.1 mg Oral BID  . heparin  5,000 Units Subcutaneous Q8H  . metoprolol succinate  25 mg Oral Daily  . pantoprazole (PROTONIX) IV  40 mg Intravenous Q12H  . Sofosbuvir-Velpatasvir  1 tablet Oral Daily    Dialysis Orders: [place] on TTS . EDW 61.0 HD Bath 2k, 2.25  ca Time 3.5 hrs Heparin 2000 and 1000 mid txm . Access LUA AVF  Hec 4 mcg IV/HD  Mircera 50 mcg q 2wks (last given 06/30/19) hgb >11 recent hosp  Sensipar 30mg  q day   Assessment/Plan: 1. Abd pain/Hx chronic/recurrent pancreatitis - continued pain, seems out of proportion to lipase level; would likely benefit from GI referral.  2. ESRD - on HD TTS.  K 4.3. Next dialysis on 2/9.   3. Anemia of CKD- Hgb 12.3, ESA not indicated at this time, follow trends 4. Secondary hyperparathyroidism - Ca at goal. Phos 8.6. Continue VDRA, add binders once diet advanced.  5. HTN/volume -  BP elevated. Continue home meds, amlodipine, metoprolol and clonidine.  Does not appear volume overloaded on exam.    6. Nutrition - Renal diet w/fluid restrictions once advanced 7. Hx CVA 8. Hx Hep C on Epclusa 9. Hx substance abuse   Jen Mow, PA-C [facility] Pager: [phone] 07/17/2019,12:14 PM  LOS: 2 days

## 2019-07-17 NOTE — Progress Notes (Signed)
PROGRESS NOTE    [name]  [id number] DOB: [birth year] DOA: 07/14/2019 PCP: Sonia Side., FNP   Brief Narrative:  HPI: [name] is a 69 y.o. female with medical history significant of end-stage renal disease on hemodialysis Tuesday, Thursday, Saturday, recurrent pancreatitis, was recently discharged from the hospital on 07/11/2019 after being treated for an episode of pancreatitis.  After returning home, she reports worsening of abdominal pain.  Pain was similar to the pancreatitis pain that she had while she was in the hospital.  Her pain got worse when she tried to eat or drink anything.  She was unable to keep anything down by mouth.  She mainly tried to consume broth as well as Nepro drinks.  She reports that her pain has substantially worse after trying to drink anything.  Her p.o. intake is been poor the last several days.  She has not had any diarrhea.  No fever, cough, shortness of breath.  Due to her worsening abdominal pain, she came back to the emergency room for evaluation.  ED Course: On arrival to the emergency room, potassium noted to be elevated at 5.3.  Her last dialysis was on 1/30.  EKG did not show any acute changes.  Lipase mildly elevated at 79.  On her last admission, lipase was only 64, but radiographic evidence of pancreatitis was noted on MRCP.  Due to her persistent abdominal pain, diminished p.o. intake and need for dialysis, she has been referred for admission.  Assessment & Plan:   Active Problems:   Hepatitis C   Dyslipidemia   Hypertension   ESRD (end stage renal disease) on dialysis (HCC)   Pancreatitis   Hyperkalemia   Acute on chronic pancreatitis (HCC)   Acute on chronic pancreatitis/recurrent pancreatitis: Once again continues to complain of 7 out of 10 pain however she continues to tolerate diet and she is on soft diet now.  No nausea or vomiting.  Continues to have epigastric abdominal tenderness which makes me think if she has also been  having some gastritis.  Will advance her diet to regular diet and try Protonix.  Hoping for discharge home tomorrow.    End-stage renal disease on HD: Receives HD on TTS schedule.  Nephrology on board.  Appreciate their help.  Hyperkalemia: Corrected after dialysis.  Essential hypertension/hypertensive urgency: Presented with blood pressure of 136/115.  Now much better and within normal range except for few episodes of hypertension.Continue all her home medications.  Dyslipidemia: Not on any statins due to recurrent pancreatitis.  DVT prophylaxis: Heparin Code Status: Full code Family Communication: None present at bedside.  Plan of care discussed with patient in length and he verbalized understanding and agreed with it. Patient is from: Home Disposition Plan: Home with home health potentially tomorrow Barriers to discharge: Clinical improvement   Estimated body mass index is 24.07 kg/m as calculated from the following:   Height as of this encounter: 5\' 2"  (1.575 m).   Weight as of this encounter: 59.7 kg.      Nutritional status:               Consultants:   Nephrology  Procedures:   None  Antimicrobials:   None   Subjective: Seen and examined.  Once again complains of 7 out of 10 abdominal pain.  No other complaint.  Tolerating soft diet.  Objective: Vitals:   07/16/19 1758 07/17/19 0005 07/17/19 0552 07/17/19 0926  BP: (!) 141/68 (!) 146/64 (!) 182/149 (!) 156/67  Pulse: 69 (!) 59 60 61  Resp: 18 15 15 16   Temp: 98.3 F (36.8 C) 98.4 F (36.9 C) 98.1 F (36.7 C) 98.3 F (36.8 C)  TempSrc: Oral Oral Oral Oral  SpO2: 100% 91% 94% 100%  Weight:      Height:        Intake/Output Summary (Last 24 hours) at 07/17/2019 1126 Last data filed at 07/17/2019 0900 Gross per 24 hour  Intake 840 ml  Output -3 ml  Net 843 ml   Filed Weights   07/14/19 1830 07/16/19 0719 07/16/19 1130  Weight: 60 kg 60.5 kg 59.7 kg    Examination:  General exam:  Appears calm and comfortable  Respiratory system: Clear to auscultation. Respiratory effort normal. Cardiovascular system: S1 & S2 heard, RRR. No JVD, murmurs, rubs, gallops or clicks. No pedal edema. Gastrointestinal system: Abdomen is nondistended, soft and epigastric tenderness. No organomegaly or masses felt. Normal bowel sounds heard. Central nervous system: Alert and oriented. No focal neurological deficits. Extremities: Symmetric 5 x 5 power. Skin: No rashes, lesions or ulcers.  Psychiatry: Judgement and insight appear poor. Mood & affect flat   Data Reviewed: I have personally reviewed following labs and imaging studies  CBC: Recent Labs  Lab 07/14/19 0915 07/14/19 1717 07/15/19 0714 07/16/19 0743 07/17/19 0620  WBC 7.5 7.5 6.6 6.0 5.8  NEUTROABS 4.2  --   --   --   --   HGB 11.7* 11.8* 13.2 12.3 12.3  HCT 36.5 36.2 40.1 38.4 38.2  MCV 86.7 87.4 87.2 87.3 87.0  PLT 202 223 239 273 [id number]   Basic Metabolic Panel: Recent Labs  Lab 07/14/19 0833 07/14/19 1717 07/15/19 0714 07/16/19 0744 07/17/19 0620  NA 135  --  137 134* 137  K 5.3*  --  4.4 4.2 4.3  CL 94*  --  94* 91* 98  CO2 22  --  29 27 27   GLUCOSE 95  --  134* 156* 90  BUN 70*  --  22 32* 12  CREATININE 15.21* 15.45* 8.30* 10.51* 6.51*  CALCIUM 9.4  --  9.3 9.5 9.7  PHOS  --   --   --  8.6*  --    GFR: Estimated Creatinine Clearance: 6.4 mL/min (A) (by C-G formula based on SCr of 6.51 mg/dL (H)). Liver Function Tests: Recent Labs  Lab 07/14/19 0833 07/15/19 0714 07/16/19 0744  AST 17 15  --   ALT 11 9  --   ALKPHOS 53 58  --   BILITOT 0.3 0.7  --   PROT 7.5 8.0  --   ALBUMIN 3.4* 3.3* 3.1*   Recent Labs  Lab 07/14/19 0833 07/14/19 1717  LIPASE 79* 82*   No results for input(s): AMMONIA in the last 168 hours. Coagulation Profile: No results for input(s): INR, PROTIME in the last 168 hours. Cardiac Enzymes: No results for input(s): CKTOTAL, CKMB, CKMBINDEX, TROPONINI in the last 168  hours. BNP (last 3 results) No results for input(s): PROBNP in the last 8760 hours. HbA1C: No results for input(s): HGBA1C in the last 72 hours. CBG: No results for input(s): GLUCAP in the last 168 hours. Lipid Profile: No results for input(s): CHOL, HDL, LDLCALC, TRIG, CHOLHDL, LDLDIRECT in the last 72 hours. Thyroid Function Tests: No results for input(s): TSH, T4TOTAL, FREET4, T3FREE, THYROIDAB in the last 72 hours. Anemia Panel: No results for input(s): VITAMINB12, FOLATE, FERRITIN, TIBC, IRON, RETICCTPCT in the last 72 hours. Sepsis Labs: No results for input(s): PROCALCITON,  LATICACIDVEN in the last 168 hours.  Recent Results (from the past 240 hour(s))  Respiratory Panel by RT PCR (Flu A&B, Covid) - Nasopharyngeal Swab     Status: None   Collection Time: 07/09/19  2:38 PM   Specimen: Nasopharyngeal Swab  Result Value Ref Range Status   SARS Coronavirus 2 by RT PCR NEGATIVE NEGATIVE Final    Comment: (NOTE) SARS-CoV-2 target nucleic acids are NOT DETECTED. The SARS-CoV-2 RNA is generally detectable in upper respiratoy specimens during the acute phase of infection. The lowest concentration of SARS-CoV-2 viral copies this assay can detect is 131 copies/mL. A negative result does not preclude SARS-Cov-2 infection and should not be used as the sole basis for treatment or other patient management decisions. A negative result may occur with  improper specimen collection/handling, submission of specimen other than nasopharyngeal swab, presence of viral mutation(s) within the areas targeted by this assay, and inadequate number of viral copies (<131 copies/mL). A negative result must be combined with clinical observations, patient history, and epidemiological information. The expected result is Negative. Fact Sheet for Patients:  PinkCheek.be Fact Sheet for Healthcare Providers:  GravelBags.it This test is not yet ap proved  or cleared by the [place] FDA and  has been authorized for detection and/or diagnosis of SARS-CoV-2 by FDA under an Emergency Use Authorization (EUA). This EUA will remain  in effect (meaning this test can be used) for the duration of the COVID-19 declaration under Section 564(b)(1) of the Act, 21 U.S.C. section 360bbb-3(b)(1), unless the authorization is terminated or revoked sooner.    Influenza A by PCR NEGATIVE NEGATIVE Final   Influenza B by PCR NEGATIVE NEGATIVE Final    Comment: (NOTE) The Xpert Xpress SARS-CoV-2/FLU/RSV assay is intended as an aid in  the diagnosis of influenza from Nasopharyngeal swab specimens and  should not be used as a sole basis for treatment. Nasal washings and  aspirates are unacceptable for Xpert Xpress SARS-CoV-2/FLU/RSV  testing. Fact Sheet for Patients: PinkCheek.be Fact Sheet for Healthcare Providers: GravelBags.it This test is not yet approved or cleared by the [place] FDA and  has been authorized for detection and/or diagnosis of SARS-CoV-2 by  FDA under an Emergency Use Authorization (EUA). This EUA will remain  in effect (meaning this test can be used) for the duration of the  Covid-19 declaration under Section 564(b)(1) of the Act, 21  U.S.C. section 360bbb-3(b)(1), unless the authorization is  terminated or revoked. Performed at [facility], [place] [address] [place], [place] [address]   SARS CORONAVIRUS 2 (TAT 6-24 HRS) Nasopharyngeal Nasopharyngeal Swab     Status: None   Collection Time: 07/14/19  1:02 PM   Specimen: Nasopharyngeal Swab  Result Value Ref Range Status   SARS Coronavirus 2 NEGATIVE NEGATIVE Final    Comment: (NOTE) SARS-CoV-2 target nucleic acids are NOT DETECTED. The SARS-CoV-2 RNA is generally detectable in upper and lower respiratory specimens during the acute phase of infection. Negative results do not preclude SARS-CoV-2 infection, do not  rule out co-infections with other pathogens, and should not be used as the sole basis for treatment or other patient management decisions. Negative results must be combined with clinical observations, patient history, and epidemiological information. The expected result is Negative. Fact Sheet for Patients: SugarRoll.be Fact Sheet for Healthcare Providers: https://www.woods-mathews.com/ This test is not yet approved or cleared by the [place] FDA and  has been authorized for detection and/or diagnosis of SARS-CoV-2 by FDA under an Emergency Use Authorization (  EUA). This EUA will remain  in effect (meaning this test can be used) for the duration of the COVID-19 declaration under Section 56 4(b)(1) of the Act, 21 U.S.C. section 360bbb-3(b)(1), unless the authorization is terminated or revoked sooner. Performed at [facility], [place] [address], [place], [place] [address]       Radiology Studies: No results found.  Scheduled Meds: . amLODipine  10 mg Oral Daily  . Chlorhexidine Gluconate Cloth  6 each Topical Q0600  . Chlorhexidine Gluconate Cloth  6 each Topical Q0600  . cloNIDine  0.1 mg Oral BID  . heparin  5,000 Units Subcutaneous Q8H  . metoprolol succinate  25 mg Oral Daily  . pantoprazole (PROTONIX) IV  40 mg Intravenous Q12H  . Sofosbuvir-Velpatasvir  1 tablet Oral Daily   Continuous Infusions:   LOS: 2 days   Time spent: 27 minutes   Darliss Cheney, MD [facility]  07/17/2019, 11:26 AM   To contact the attending provider between 7A-7P or the covering provider during after hours 7P-7A, please log into the web site [email or web address].

## 2019-07-18 ENCOUNTER — Inpatient Hospital Stay (HOSPITAL_COMMUNITY): Payer: Medicare Other

## 2019-07-18 DIAGNOSIS — J9600 Acute respiratory failure, unspecified whether with hypoxia or hypercapnia: Secondary | ICD-10-CM | POA: Clinically undetermined

## 2019-07-18 LAB — CBC
HCT: 34.5 % — ABNORMAL LOW (ref 36.0–46.0)
Hemoglobin: 11 g/dL — ABNORMAL LOW (ref 12.0–15.0)
MCH: 27.9 pg (ref 26.0–34.0)
MCHC: 31.9 g/dL (ref 30.0–36.0)
MCV: 87.6 fL (ref 80.0–100.0)
Platelets: 275 10*3/uL (ref 150–400)
RBC: 3.94 MIL/uL (ref 3.87–5.11)
RDW: 16.6 % — ABNORMAL HIGH (ref 11.5–15.5)
WBC: 4.8 10*3/uL (ref 4.0–10.5)
nRBC: 0 % (ref 0.0–0.2)

## 2019-07-18 LAB — BASIC METABOLIC PANEL
Anion gap: 14 (ref 5–15)
BUN: 23 mg/dL (ref 8–23)
CO2: 26 mmol/L (ref 22–32)
Calcium: 9.1 mg/dL (ref 8.9–10.3)
Chloride: 96 mmol/L — ABNORMAL LOW (ref 98–111)
Creatinine, Ser: 9.26 mg/dL — ABNORMAL HIGH (ref 0.44–1.00)
GFR calc Af Amer: 4 mL/min — ABNORMAL LOW (ref >60)
GFR calc non Af Amer: 4 mL/min — ABNORMAL LOW (ref >60)
Glucose, Bld: 170 mg/dL — ABNORMAL HIGH (ref 70–99)
Potassium: 4.1 mmol/L (ref 3.5–5.1)
Sodium: 136 mmol/L (ref 135–145)

## 2019-07-18 MED ORDER — OXYCODONE HCL 5 MG PO TABS
5.0000 mg | ORAL_TABLET | Freq: Four times a day (QID) | ORAL | Status: DC | PRN
  Administered 2019-07-18 – 2019-07-20 (×6): 5 mg via ORAL
  Filled 2019-07-18 (×6): qty 1

## 2019-07-18 MED ORDER — MORPHINE SULFATE (PF) 2 MG/ML IV SOLN: 1.0000 mg | INTRAVENOUS | Status: DC | PRN

## 2019-07-18 MED ORDER — POLYETHYLENE GLYCOL 3350 17 G PO PACK
17.0000 g | PACK | Freq: Every day | ORAL | Status: DC | PRN
  Administered 2019-07-18 – 2019-07-19 (×2): 17 g via ORAL
  Filled 2019-07-18 (×2): qty 1

## 2019-07-18 NOTE — Progress Notes (Signed)
SATURATION QUALIFICATIONS:   Patient Saturations on Room Air at Rest = 97%  Patient Saturations on Room Air while Ambulating = 86%  Patient Saturations on 2 Liters of oxygen while Ambulating = 96%  Patient ambulating in hallway on room air and oxygen saturation dropped down to 88%, she was able to perform some deep breathing exercises and oxygen returned to 92%RA. After further ambulating in hallway patient oxygen saturation dropped down to 86% and was unable to return above 90%, so O2 [place] was applied at 2L. She then returned to 96% 2L [place]. No c/o SOB/dyspnea during ambulation.

## 2019-07-18 NOTE — Progress Notes (Signed)
Subjective:  Feels better / hd tomor on schedule;e   Objective Vital signs in last 24 hours: Vitals:   07/18/19 0026 07/18/19 0512 07/18/19 0846 07/18/19 1153  BP: (!) 146/67 139/62  (!) 119/57  Pulse: 66 (!) 52 62 (!) 54  Resp: 18 18  16   Temp: 98.6 F (37 C) 97.9 F (36.6 C)  98.2 F (36.8 C)  TempSrc: Oral Oral  Oral  SpO2: 96% 100%  97%  Weight:      Height:       Weight change:     Physical Exam: General:  NAD Alert .Pleasant ,elderly AAF  Heart: RRR, No m, r, G Lungs: CTA  unlabored  Breathing  Abdomen: BS  Pos , soft , non tender no ascites  Extremities: no pedal edema Dialysis Access:  Pos bruit  LUA AVF    OP Dialysis Orders: [facility] on TTS . EDW 61.0 HD Bath 2k, 2.25 ca Time 3.5 hrs Heparin 2000 and 1000 mid txm . Access LUA AVF  Hec 4 mcg IV/HD  Mircera 50 mcg q 2wks (last given 06/30/19) hgb >11 recent hosp  Sensipar 30mg  q day   Problem/Plan: 1. Abd pain/Hx chronic/recurrent pancreatitis -plan per admit /possible dc tomor  2. ESRD- on HD TTS. K 4.1. Next dialysis on 2/9.   3. Anemiaof CKD- Hgb 11.0 , ESA not indicated at this time, follow trends 4. Secondary hyperparathyroidism- Ca at goal. Phos 8.6. Continue VDRA, add binders once diet advanced.  5.HTN/volume- BP elevated.Continue home meds, amlodipine, metoprolol and clonidine. Wt loss from decr appetite lower edw = 60.0 kg  Does not appear volume overloaded on exam.   6. Nutrition- Renal diet w/fluid restrictions once advanced 7. Hx CVA 8. Hx Hep C on Epclusa 9. Hx substance abuse    Ernest Haber, PA-C [facility] [phone] 07/18/2019,1:14 PM  LOS: 3 days   Labs: Basic Metabolic Panel: Recent Labs  Lab 07/16/19 0744 07/17/19 0620 07/18/19 0810  NA 134* 137 136  K 4.2 4.3 4.1  CL 91* 98 96*  CO2 27 27 26   GLUCOSE 156* 90 170*  BUN 32* 12 23  CREATININE 10.51* 6.51* 9.26*  CALCIUM 9.5 9.7 9.1  PHOS 8.6*  --   --    Liver Function  Tests: Recent Labs  Lab 07/14/19 0833 07/15/19 0714 07/16/19 0744  AST 17 15  --   ALT 11 9  --   ALKPHOS 53 58  --   BILITOT 0.3 0.7  --   PROT 7.5 8.0  --   ALBUMIN 3.4* 3.3* 3.1*   Recent Labs  Lab 07/14/19 0833 07/14/19 1717  LIPASE 79* 82*   No results for input(s): AMMONIA in the last 168 hours. CBC: Recent Labs  Lab 07/14/19 0915 07/14/19 0915 07/14/19 1717 07/14/19 1717 07/15/19 0714 07/15/19 0714 07/16/19 0743 07/17/19 0620 07/18/19 0810  WBC 7.5   < > 7.5   < > 6.6   < > 6.0 5.8 4.8  NEUTROABS 4.2  --   --   --   --   --   --   --   --   HGB 11.7*   < > 11.8*   < > 13.2   < > 12.3 12.3 11.0*  HCT 36.5   < > 36.2   < > 40.1   < > 38.4 38.2 34.5*  MCV 86.7   < > 87.4  --  87.2  --  87.3 87.0 87.6  PLT  202   < > 223   < > 239   < > 273 265 275   < > = values in this interval not displayed.   Cardiac Enzymes: No results for input(s): CKTOTAL, CKMB, CKMBINDEX, TROPONINI in the last 168 hours. CBG: No results for input(s): GLUCAP in the last 168 hours.  Studies/Results: No results found. Medications:  . amLODipine  10 mg Oral Daily  . Chlorhexidine Gluconate Cloth  6 each Topical Q0600  . Chlorhexidine Gluconate Cloth  6 each Topical Q0600  . cloNIDine  0.1 mg Oral BID  . heparin  5,000 Units Subcutaneous Q8H  . metoprolol succinate  25 mg Oral Daily  . pantoprazole (PROTONIX) IV  40 mg Intravenous Q12H

## 2019-07-18 NOTE — Progress Notes (Signed)
PROGRESS NOTE    [place] [name]  [id number] DOB: 07-21-1950 DOA: 07/14/2019 PCP: Sonia Side., FNP     Brief Narrative:  Patient is a 69 year old female with a past medical history of end-stage renal disease on hemodialysis TTS, recurrent pancreatitis, dyslipidemia, hepatitis C, and hypertension who was recently discharged on 2/1 for pancreatitis. She had worsening of pain after going home and came back on 2/4 where she was readmitted.   New events last 24 hours / Subjective: Patient ate a little bit this morning and has slight pain though greatly improved from yesterday. Reports around 50% improvement. She is breathing fine but her saturations have been noted to drop when she physically exerts herself. She is not on oxygen at home and denies any history of COPD or asthma.  Assessment & Plan:   Principal Problem:   Acute on chronic pancreatitis (HCC)  Continue to advance diet  Morphine as needed  Oxycodone as needed for moderate pain  Zofran for nausea  This is improving  Active Problems:   Acute respiratory failure (HCC)  Supplemental oxygen as needed  CXR today  If neg, will consider CT chest  Daily weights, does not seem volume overloaded and I do not hear any wheezing or rales on exam    Hepatitis C  Hold home med for now    Hypertension  Continue Toprol XL 25 mg daily  Continue clonidine 0.1 mg twice daily  Continue amlodipine 10 mg daily    ESRD (end stage renal disease) on dialysis [facility])  Appreciate nephrology  Monitor BMP    Hyperkalemia    Resolved  Monitor  DVT prophylaxis: Heparin Code Status: Full Family Communication: Self Disposition Plan: Patient is coming from home, will be discharged home with home health; barrier to discharge includes hypoxemia   Consultants:   Nephrology   Objective: Vitals:   07/18/19 0026 07/18/19 0512 07/18/19 0846 07/18/19 1153  BP: (!) 146/67 139/62  (!) 119/57  Pulse: 66 (!) 52 62 (!) 54  Resp: 18 18  16    Temp: 98.6 F (37 C) 97.9 F (36.6 C)  98.2 F (36.8 C)  TempSrc: Oral Oral  Oral  SpO2: 96% 100%  97%  Weight:      Height:        Intake/Output Summary (Last 24 hours) at 07/18/2019 1425 Last data filed at 07/17/2019 1800 Gross per 24 hour  Intake 240 ml  Output -  Net 240 ml   Filed Weights   07/14/19 1830 07/16/19 0719 07/16/19 1130  Weight: 60 kg 60.5 kg 59.7 kg    Examination:  General exam: Appears calm and comfortable  Respiratory system: Clear to auscultation. Respiratory effort normal. No respiratory distress. No conversational dyspnea.  Cardiovascular system: S1 & S2 heard, RRR. No murmurs. No pedal edema. Gastrointestinal system: Abdomen is nondistended, soft and mildly tender to palpation in the epigastric region. Normal bowel sounds heard. Central nervous system: Alert and oriented. No focal neurological deficits. Speech clear.  Extremities: Symmetric in appearance  Skin: No rashes, lesions or ulcers on exposed skin  Psychiatry: Judgement and insight appear normal. Mood & affect appropriate.   Data Reviewed: I have personally reviewed following labs and imaging studies  CBC: Recent Labs  Lab 07/14/19 0915 07/14/19 0915 07/14/19 1717 07/15/19 0714 07/16/19 0743 07/17/19 0620 07/18/19 0810  WBC 7.5   < > 7.5 6.6 6.0 5.8 4.8  NEUTROABS 4.2  --   --   --   --   --   --  HGB 11.7*   < > 11.8* 13.2 12.3 12.3 11.0*  HCT 36.5   < > 36.2 40.1 38.4 38.2 34.5*  MCV 86.7   < > 87.4 87.2 87.3 87.0 87.6  PLT 202   < > 223 239 273 265 275   < > = values in this interval not displayed.   Basic Metabolic Panel: Recent Labs  Lab 07/14/19 0833 07/14/19 0833 07/14/19 1717 07/15/19 0714 07/16/19 0744 07/17/19 0620 07/18/19 0810  NA 135  --   --  137 134* 137 136  K 5.3*  --   --  4.4 4.2 4.3 4.1  CL 94*  --   --  94* 91* 98 96*  CO2 22  --   --  29 27 27 26   GLUCOSE 95  --   --  134* 156* 90 170*  BUN 70*  --   --  22 32* 12 23  CREATININE 15.21*   < >  15.45* 8.30* 10.51* 6.51* 9.26*  CALCIUM 9.4  --   --  9.3 9.5 9.7 9.1  PHOS  --   --   --   --  8.6*  --   --    < > = values in this interval not displayed.   GFR: Estimated Creatinine Clearance: 4.5 mL/min (A) (by C-G formula based on SCr of 9.26 mg/dL (H)). Liver Function Tests: Recent Labs  Lab 07/14/19 0833 07/15/19 0714 07/16/19 0744  AST 17 15  --   ALT 11 9  --   ALKPHOS 53 58  --   BILITOT 0.3 0.7  --   PROT 7.5 8.0  --   ALBUMIN 3.4* 3.3* 3.1*   Recent Labs  Lab 07/14/19 [id number] 07/14/19 1717  LIPASE 79* 82*   Recent Results (from the past 240 hour(s))  Respiratory Panel by RT PCR (Flu A&B, Covid) - Nasopharyngeal Swab     Status: None   Collection Time: 07/09/19  2:38 PM   Specimen: Nasopharyngeal Swab  Result Value Ref Range Status   SARS Coronavirus 2 by RT PCR NEGATIVE NEGATIVE Final    Comment: (NOTE) SARS-CoV-2 target nucleic acids are NOT DETECTED. The SARS-CoV-2 RNA is generally detectable in upper respiratoy specimens during the acute phase of infection. The lowest concentration of SARS-CoV-2 viral copies this assay can detect is 131 copies/mL. A negative result does not preclude SARS-Cov-2 infection and should not be used as the sole basis for treatment or other patient management decisions. A negative result may occur with  improper specimen collection/handling, submission of specimen other than nasopharyngeal swab, presence of viral mutation(s) within the areas targeted by this assay, and inadequate number of viral copies (<131 copies/mL). A negative result must be combined with clinical observations, patient history, and epidemiological information. The expected result is Negative. Fact Sheet for Patients:  PinkCheek.be Fact Sheet for Healthcare Providers:  GravelBags.it This test is not yet ap proved or cleared by the [place] FDA and  has been authorized for detection and/or  diagnosis of SARS-CoV-2 by FDA under an Emergency Use Authorization (EUA). This EUA will remain  in effect (meaning this test can be used) for the duration of the COVID-19 declaration under Section 564(b)(1) of the Act, 21 U.S.C. section 360bbb-3(b)(1), unless the authorization is terminated or revoked sooner.    Influenza A by PCR NEGATIVE NEGATIVE Final   Influenza B by PCR NEGATIVE NEGATIVE Final    Comment: (NOTE) The Xpert Xpress SARS-CoV-2/FLU/RSV assay is intended as an aid  in  the diagnosis of influenza from Nasopharyngeal swab specimens and  should not be used as a sole basis for treatment. Nasal washings and  aspirates are unacceptable for Xpert Xpress SARS-CoV-2/FLU/RSV  testing. Fact Sheet for Patients: PinkCheek.be Fact Sheet for Healthcare Providers: GravelBags.it This test is not yet approved or cleared by the [place] FDA and  has been authorized for detection and/or diagnosis of SARS-CoV-2 by  FDA under an Emergency Use Authorization (EUA). This EUA will remain  in effect (meaning this test can be used) for the duration of the  Covid-19 declaration under Section 564(b)(1) of the Act, 21  U.S.C. section 360bbb-3(b)(1), unless the authorization is  terminated or revoked. Performed at [facility], [place] [address], [place], [place] [address]   SARS CORONAVIRUS 2 (TAT 6-24 HRS) Nasopharyngeal Nasopharyngeal Swab     Status: None   Collection Time: 07/14/19  1:02 PM   Specimen: Nasopharyngeal Swab  Result Value Ref Range Status   SARS Coronavirus 2 NEGATIVE NEGATIVE Final    Comment: (NOTE) SARS-CoV-2 target nucleic acids are NOT DETECTED. The SARS-CoV-2 RNA is generally detectable in upper and lower respiratory specimens during the acute phase of infection. Negative results do not preclude SARS-CoV-2 infection, do not rule out co-infections with other pathogens, and should not be used as the sole  basis for treatment or other patient management decisions. Negative results must be combined with clinical observations, patient history, and epidemiological information. The expected result is Negative. Fact Sheet for Patients: SugarRoll.be Fact Sheet for Healthcare Providers: https://www.woods-mathews.com/ This test is not yet approved or cleared by the [place] FDA and  has been authorized for detection and/or diagnosis of SARS-CoV-2 by FDA under an Emergency Use Authorization (EUA). This EUA will remain  in effect (meaning this test can be used) for the duration of the COVID-19 declaration under Section 56 4(b)(1) of the Act, 21 U.S.C. section 360bbb-3(b)(1), unless the authorization is terminated or revoked sooner. Performed at [facility], [place] [address], [place], [place] [address]      Scheduled Meds: . amLODipine  10 mg Oral Daily  . Chlorhexidine Gluconate Cloth  6 each Topical Q0600  . Chlorhexidine Gluconate Cloth  6 each Topical Q0600  . cloNIDine  0.1 mg Oral BID  . heparin  5,000 Units Subcutaneous Q8H  . metoprolol succinate  25 mg Oral Daily  . pantoprazole (PROTONIX) IV  40 mg Intravenous Q12H   Continuous Infusions:   LOS: 3 days    Time spent: 30 minutes   Shelda Pal, DO [facility] 07/18/2019, 2:25 PM   Available via Epic secure chat 7am-7pm After these hours, please refer to coverage provider listed on [email or web address]

## 2019-07-18 NOTE — Progress Notes (Signed)
PT Cancellation Note  Patient Details Name: Ashley [name] MRN: [id number] DOB: [birth year]   Cancelled Treatment:    Reason Eval/Treat Not Completed: Pain limiting ability to participate;Fatigue/lethargy limiting ability to participate.  Pt is asking to wait and will retry tomorrow.   [name] 07/18/2019, 4:11 PM  [name], PT MS Acute Rehab Dept. Number: [place] [facility] [place]

## 2019-07-19 LAB — BASIC METABOLIC PANEL
Anion gap: 19 — ABNORMAL HIGH (ref 5–15)
BUN: 33 mg/dL — ABNORMAL HIGH (ref 8–23)
CO2: 22 mmol/L (ref 22–32)
Calcium: 9 mg/dL (ref 8.9–10.3)
Chloride: 92 mmol/L — ABNORMAL LOW (ref 98–111)
Creatinine, Ser: 11.34 mg/dL — ABNORMAL HIGH (ref 0.44–1.00)
GFR calc Af Amer: 4 mL/min — ABNORMAL LOW (ref >60)
GFR calc non Af Amer: 3 mL/min — ABNORMAL LOW (ref >60)
Glucose, Bld: 136 mg/dL — ABNORMAL HIGH (ref 70–99)
Potassium: 4.9 mmol/L (ref 3.5–5.1)
Sodium: 133 mmol/L — ABNORMAL LOW (ref 135–145)

## 2019-07-19 LAB — PHOSPHORUS: Phosphorus: 7.2 mg/dL — ABNORMAL HIGH (ref 2.5–4.6)

## 2019-07-19 LAB — CBC
HCT: 32.9 % — ABNORMAL LOW (ref 36.0–46.0)
Hemoglobin: 10.6 g/dL — ABNORMAL LOW (ref 12.0–15.0)
MCH: 28.5 pg (ref 26.0–34.0)
MCHC: 32.2 g/dL (ref 30.0–36.0)
MCV: 88.4 fL (ref 80.0–100.0)
Platelets: 303 10*3/uL (ref 150–400)
RBC: 3.72 MIL/uL — ABNORMAL LOW (ref 3.87–5.11)
RDW: 16.9 % — ABNORMAL HIGH (ref 11.5–15.5)
WBC: 5.8 10*3/uL (ref 4.0–10.5)
nRBC: 0 % (ref 0.0–0.2)

## 2019-07-19 MED ORDER — SORBITOL 70 % SOLN
30.0000 mL | Freq: Three times a day (TID) | Status: DC | PRN
  Administered 2019-07-19: 16:00:00 30 mL via ORAL
  Filled 2019-07-19: qty 30

## 2019-07-19 MED ORDER — PANTOPRAZOLE SODIUM 40 MG PO TBEC
40.0000 mg | DELAYED_RELEASE_TABLET | Freq: Two times a day (BID) | ORAL | Status: DC
  Administered 2019-07-19 – 2019-07-20 (×3): 40 mg via ORAL
  Filled 2019-07-19 (×3): qty 1

## 2019-07-19 MED ORDER — ALBUTEROL SULFATE (2.5 MG/3ML) 0.083% IN NEBU: 2.5000 mg | INHALATION_SOLUTION | RESPIRATORY_TRACT | Status: DC | PRN

## 2019-07-19 NOTE — Progress Notes (Signed)
Subjective:  Seen on HD , tolerating 1l uf  bp stable / tolerated oatmeal, cos constipation , Sorbitol after HD// thinks may go home today   Objective Vital signs in last 24 hours: Vitals:   07/18/19 1800 07/18/19 2354 07/19/19 0524 07/19/19 0525  BP: (!) 104/92 (!) 128/53 133/77   Pulse: 71 (!) 46 (!) 51 (!) 51  Resp: 16 15 17    Temp: 98.1 F (36.7 C) 98.3 F (36.8 C) 97.8 F (36.6 C)   TempSrc: Oral Oral Oral   SpO2: 94% 93% 95% 91%  Weight:    60.7 kg  Height:       Weight change:   Physical  Exam: General:NAD Alert .Pleasant ,elderly AAF  Heart:RRR, No m, r, G Lungs:CTA  unlabored Breathing  Abdomen:BS Pos , soft , non tender no ascites  Extremities:no pedal edema Dialysis Access:patent  On hd LUA AVF    OP Dialysis Orders: [place] on TTS . EDW 61.0 HD Bath 2k, 2.25 ca Time 3.5 hrs Heparin 2000 and 1000 mid txm . Access LUA AVF  Hec 4 mcg IV/HD  Mircera 50 mcg q 2wks (last given 06/30/19) hgb >11 recent hosp  Sensipar 30mg  q day   Problem/Plan: 1. Abd pain/Hx chronic/recurrent pancreatitis-plan per admit /possible dc today  2. ESRD- on HD TTS.K 4.1.on Schedule  dialysis. 3. Anemiaof CKD- Hgb 11.0 , ESA not indicated at this time, follow trends 4. Secondary hyperparathyroidism- Ca at goal. Phos 8.6. Continue VDRA, add binders once diet advanced.  5.HTN/volume- BPelevated down with hd uf now 134/70 .Continue home meds, amlodipine, metoprolol and clonidine. Wt loss from decr appetite lower edw = 60.0 kg  Does not appear volume overloaded on exam.  6. Nutrition- Renal diet w/fluid restrictions once advanced 7. Hx CVA 8. Hx Hep C on Epclusa 9. Hx substance abuse=Tobacco abuse  Now off  Cig. 2 weeks  "may  stop"  Ernest Haber, PA-C [facility] Beeper [phone] 07/19/2019,12:45 PM  LOS: 4 days   Labs: Basic Metabolic Panel: Recent Labs  Lab 07/16/19 0744 07/17/19 0620 07/18/19 0810  NA 134* 137 136  K 4.2  4.3 4.1  CL 91* 98 96*  CO2 27 27 26   GLUCOSE 156* 90 170*  BUN 32* 12 23  CREATININE 10.51* 6.51* 9.26*  CALCIUM 9.5 9.7 9.1  PHOS 8.6*  --   --    Liver Function Tests: Recent Labs  Lab 07/14/19 0833 07/15/19 0714 07/16/19 0744  AST 17 15  --   ALT 11 9  --   ALKPHOS 53 58  --   BILITOT 0.3 0.7  --   PROT 7.5 8.0  --   ALBUMIN 3.4* 3.3* 3.1*   Recent Labs  Lab 07/14/19 0833 07/14/19 1717  LIPASE 79* 82*   No results for input(s): AMMONIA in the last 168 hours. CBC: Recent Labs  Lab 07/14/19 0915 07/14/19 0915 07/14/19 1717 07/14/19 1717 07/15/19 0714 07/15/19 0714 07/16/19 0743 07/17/19 0620 07/18/19 0810  WBC 7.5   < > 7.5   < > 6.6   < > 6.0 5.8 4.8  NEUTROABS 4.2  --   --   --   --   --   --   --   --   HGB 11.7*   < > 11.8*   < > 13.2   < > 12.3 12.3 11.0*  HCT 36.5   < > 36.2   < > 40.1   < > 38.4 38.2 34.5*  MCV 86.7   < > 87.4  --  87.2  --  87.3 87.0 87.6  PLT 202   < > 223   < > 239   < > 273 265 275   < > = values in this interval not displayed.   Cardiac Enzymes: No results for input(s): CKTOTAL, CKMB, CKMBINDEX, TROPONINI in the last 168 hours. CBG: No results for input(s): GLUCAP in the last 168 hours.  Studies/Results: DG CHEST PORT 1 VIEW  Result Date: 07/18/2019 CLINICAL DATA:  Hypoxia EXAM: PORTABLE CHEST 1 VIEW COMPARISON:  07/14/2019 FINDINGS: Heart and mediastinal contours are within normal limits. No focal opacities or effusions. No acute bony abnormality. IMPRESSION: No active disease. Electronically Signed   By: Rolm Baptise M.D.   On: 07/18/2019 15:07   Medications:  . amLODipine  10 mg Oral Daily  . Chlorhexidine Gluconate Cloth  6 each Topical Q0600  . Chlorhexidine Gluconate Cloth  6 each Topical Q0600  . cloNIDine  0.1 mg Oral BID  . heparin  5,000 Units Subcutaneous Q8H  . metoprolol succinate  25 mg Oral Daily  . pantoprazole  40 mg Oral BID

## 2019-07-19 NOTE — Progress Notes (Signed)
Pt was not seen for visit today as she is in HD, will retry tomorrow.   07/19/19 1300  PT Visit Information  Last PT Received On 07/19/19  Reason Eval/Treat Not Completed Patient at procedure or test/unavailable    [name], PT MS Acute Rehab Dept. Number: [place] and [place]

## 2019-07-19 NOTE — Progress Notes (Signed)
PROGRESS NOTE    Ashley [name]  [id number] DOB: 02-07-1951 DOA: 07/14/2019 PCP: Sonia Side., FNP     Brief Narrative:  Patient is a 69 year old female with a past medical history of end-stage renal disease on hemodialysis TTS, recurrent pancreatitis, dyslipidemia, hepatitis C, and hypertension who was recently discharged on 2/1 for pancreatitis. She had worsening of pain after going home and came back on 2/4 where she was readmitted.  Her pancreatitis progressed nicely, though she developed low oxygen saturations with ambulation.   New events last 24 hours / Subjective: Pain is better.  Continues to eat with some pain and nausea still.  She did desaturate with ambulation yesterday.  X-ray was unremarkable.  Started incentive spirometry yesterday.  Assessment & Plan:   Principal Problem:  Acute on chronic pancreatitis (HCC)             Continue to advance diet             Morphine as needed             Oxycodone as needed for moderate pain             Zofran for nausea             This is improving  Active Problems:   Acute respiratory failure (HCC)             Supplemental oxygen as needed             If she desaturates today with ambulation, will obtain CT chest  Will trial an albuterol nebulization as well             Daily weights, does not seem volume overloaded and I do not hear any wheezing or rales on exam    Hepatitis C             Hold home med for now    Hypertension             Continue Toprol XL 25 mg daily             Continue clonidine 0.1 mg twice daily             Continue amlodipine 10 mg daily    ESRD (end stage renal disease) on dialysis [facility])             Appreciate nephrology             Monitor BMP    Hyperkalemia             Resolved             Monitor  DVT prophylaxis: Heparin Code Status: Full Family Communication: Self Coming From: Home Disposition Plan: Home with home health Barriers to Discharge: Persistent  hypoxia  Consultants:   Nephrology  Antimicrobials:  Anti-infectives (From admission, onward)   Start     Dose/Rate Route Frequency Ordered Stop   07/14/19 1600  Sofosbuvir-Velpatasvir 400-100 MG TABS 1 tablet  Status:  Discontinued    Note to Pharmacy: Take 1 tablet by mouth daily.     1 tablet Oral Daily 07/14/19 1554 07/18/19 1250        Objective: Vitals:   07/18/19 1800 07/18/19 2354 07/19/19 0524 07/19/19 0525  BP: (!) 104/92 (!) 128/53 133/77   Pulse: 71 (!) 46 (!) 51 (!) 51  Resp: 16 15 17    Temp: 98.1 F (36.7 C) 98.3 F (36.8 C) 97.8 F (36.6 C)   TempSrc:  Oral Oral Oral   SpO2: 94% 93% 95% 91%  Weight:    60.7 kg  Height:        Intake/Output Summary (Last 24 hours) at 07/19/2019 1252 Last data filed at 07/18/2019 1800 Gross per 24 hour  Intake 360 ml  Output --  Net 360 ml   Filed Weights   07/16/19 0719 07/16/19 1130 07/19/19 0525  Weight: 60.5 kg 59.7 kg 60.7 kg    Examination:  General exam: Appears calm and comfortable  Respiratory system: Diffuse rhonchi but no rales or wheezing. Respiratory effort normal. No respiratory distress. No conversational dyspnea.  Cardiovascular system: S1 & S2 heard, RRR. No murmurs. No pedal edema. Gastrointestinal system: Abdomen is nondistended, soft and nontender. Normal bowel sounds heard. Central nervous system: Alert and oriented. No focal neurological deficits. Speech clear.  Extremities: Symmetric in appearance  Skin: No rashes, lesions or ulcers on exposed skin  Psychiatry: Judgement and insight appear normal. Mood & affect appropriate.   Data Reviewed: I have personally reviewed following labs and imaging studies  CBC: Recent Labs  Lab 07/14/19 0915 07/14/19 0915 07/14/19 1717 07/15/19 0714 07/16/19 0743 07/17/19 0620 07/18/19 0810  WBC 7.5   < > 7.5 6.6 6.0 5.8 4.8  NEUTROABS 4.2  --   --   --   --   --   --   HGB 11.7*   < > 11.8* 13.2 12.3 12.3 11.0*  HCT 36.5   < > 36.2 40.1 38.4 38.2 34.5*   MCV 86.7   < > 87.4 87.2 87.3 87.0 87.6  PLT 202   < > 223 239 273 265 275   < > = values in this interval not displayed.   Basic Metabolic Panel: Recent Labs  Lab 07/14/19 0833 07/14/19 0833 07/14/19 1717 07/15/19 0714 07/16/19 0744 07/17/19 0620 07/18/19 0810  NA 135  --   --  137 134* 137 136  K 5.3*  --   --  4.4 4.2 4.3 4.1  CL 94*  --   --  94* 91* 98 96*  CO2 22  --   --  29 27 27 26   GLUCOSE 95  --   --  134* 156* 90 170*  BUN 70*  --   --  22 32* 12 23  CREATININE 15.21*   < > 15.45* 8.30* 10.51* 6.51* 9.26*  CALCIUM 9.4  --   --  9.3 9.5 9.7 9.1  PHOS  --   --   --   --  8.6*  --   --    < > = values in this interval not displayed.   GFR: Estimated Creatinine Clearance: 4.8 mL/min (A) (by C-G formula based on SCr of 9.26 mg/dL (H)). Liver Function Tests: Recent Labs  Lab 07/14/19 0833 07/15/19 0714 07/16/19 0744  AST 17 15  --   ALT 11 9  --   ALKPHOS 53 58  --   BILITOT 0.3 0.7  --   PROT 7.5 8.0  --   ALBUMIN 3.4* 3.3* 3.1*   Recent Labs  Lab 07/14/19 [id number] 07/14/19 1717  LIPASE 79* 82*   Recent Results (from the past 240 hour(s))  Respiratory Panel by RT PCR (Flu A&B, Covid) - Nasopharyngeal Swab     Status: None   Collection Time: 07/09/19  2:38 PM   Specimen: Nasopharyngeal Swab  Result Value Ref Range Status   SARS Coronavirus 2 by RT PCR NEGATIVE NEGATIVE Final    Comment: (NOTE)  SARS-CoV-2 target nucleic acids are NOT DETECTED. The SARS-CoV-2 RNA is generally detectable in upper respiratoy specimens during the acute phase of infection. The lowest concentration of SARS-CoV-2 viral copies this assay can detect is 131 copies/mL. A negative result does not preclude SARS-Cov-2 infection and should not be used as the sole basis for treatment or other patient management decisions. A negative result may occur with  improper specimen collection/handling, submission of specimen other than nasopharyngeal swab, presence of viral mutation(s) within  the areas targeted by this assay, and inadequate number of viral copies (<131 copies/mL). A negative result must be combined with clinical observations, patient history, and epidemiological information. The expected result is Negative. Fact Sheet for Patients:  PinkCheek.be Fact Sheet for Healthcare Providers:  GravelBags.it This test is not yet ap proved or cleared by the [place] FDA and  has been authorized for detection and/or diagnosis of SARS-CoV-2 by FDA under an Emergency Use Authorization (EUA). This EUA will remain  in effect (meaning this test can be used) for the duration of the COVID-19 declaration under Section 564(b)(1) of the Act, 21 U.S.C. section 360bbb-3(b)(1), unless the authorization is terminated or revoked sooner.    Influenza A by PCR NEGATIVE NEGATIVE Final   Influenza B by PCR NEGATIVE NEGATIVE Final    Comment: (NOTE) The Xpert Xpress SARS-CoV-2/FLU/RSV assay is intended as an aid in  the diagnosis of influenza from Nasopharyngeal swab specimens and  should not be used as a sole basis for treatment. Nasal washings and  aspirates are unacceptable for Xpert Xpress SARS-CoV-2/FLU/RSV  testing. Fact Sheet for Patients: PinkCheek.be Fact Sheet for Healthcare Providers: GravelBags.it This test is not yet approved or cleared by the [place] FDA and  has been authorized for detection and/or diagnosis of SARS-CoV-2 by  FDA under an Emergency Use Authorization (EUA). This EUA will remain  in effect (meaning this test can be used) for the duration of the  Covid-19 declaration under Section 564(b)(1) of the Act, 21  U.S.C. section 360bbb-3(b)(1), unless the authorization is  terminated or revoked. Performed at [facility], [place] [address], [place], [place] [address]   SARS CORONAVIRUS 2 (TAT 6-24 HRS) Nasopharyngeal Nasopharyngeal Swab      Status: None   Collection Time: 07/14/19  1:02 PM   Specimen: Nasopharyngeal Swab  Result Value Ref Range Status   SARS Coronavirus 2 NEGATIVE NEGATIVE Final    Comment: (NOTE) SARS-CoV-2 target nucleic acids are NOT DETECTED. The SARS-CoV-2 RNA is generally detectable in upper and lower respiratory specimens during the acute phase of infection. Negative results do not preclude SARS-CoV-2 infection, do not rule out co-infections with other pathogens, and should not be used as the sole basis for treatment or other patient management decisions. Negative results must be combined with clinical observations, patient history, and epidemiological information. The expected result is Negative. Fact Sheet for Patients: SugarRoll.be Fact Sheet for Healthcare Providers: https://www.woods-mathews.com/ This test is not yet approved or cleared by the [place] FDA and  has been authorized for detection and/or diagnosis of SARS-CoV-2 by FDA under an Emergency Use Authorization (EUA). This EUA will remain  in effect (meaning this test can be used) for the duration of the COVID-19 declaration under Section 56 4(b)(1) of the Act, 21 U.S.C. section 360bbb-3(b)(1), unless the authorization is terminated or revoked sooner. Performed at [facility], [place] [address] [place], [place] [address]       Radiology Studies: DG CHEST PORT 1 VIEW  Result Date: 07/18/2019 CLINICAL DATA:  Hypoxia EXAM: PORTABLE CHEST 1 VIEW COMPARISON:  07/14/2019 FINDINGS: Heart and mediastinal contours are within normal limits. No focal opacities or effusions. No acute bony abnormality. IMPRESSION: No active disease. Electronically Signed   By: Rolm Baptise M.D.   On: 07/18/2019 15:07     Scheduled Meds: . amLODipine  10 mg Oral Daily  . Chlorhexidine Gluconate Cloth  6 each Topical Q0600  . Chlorhexidine Gluconate Cloth  6 each Topical Q0600  . cloNIDine  0.1 mg Oral  BID  . heparin  5,000 Units Subcutaneous Q8H  . metoprolol succinate  25 mg Oral Daily  . pantoprazole  40 mg Oral BID   Continuous Infusions:   LOS: 4 days    Time spent: 30 minutes   Shelda Pal, DO [facility] 07/19/2019, 12:52 PM   Available via Epic secure chat 7am-7pm After these hours, please refer to coverage provider listed on [email or web address]

## 2019-07-20 LAB — CBC
HCT: 36.1 % (ref 36.0–46.0)
Hemoglobin: 11.4 g/dL — ABNORMAL LOW (ref 12.0–15.0)
MCH: 27.8 pg (ref 26.0–34.0)
MCHC: 31.6 g/dL (ref 30.0–36.0)
MCV: 88 fL (ref 80.0–100.0)
Platelets: 291 10*3/uL (ref 150–400)
RBC: 4.1 MIL/uL (ref 3.87–5.11)
RDW: 16.9 % — ABNORMAL HIGH (ref 11.5–15.5)
WBC: 5.3 10*3/uL (ref 4.0–10.5)
nRBC: 0 % (ref 0.0–0.2)

## 2019-07-20 MED ORDER — TRAMADOL HCL 50 MG PO TABS: 50.0000 mg | ORAL_TABLET | Freq: Two times a day (BID) | ORAL | 0 refills | Status: AC | PRN

## 2019-07-20 NOTE — Discharge Summary (Signed)
Physician Discharge Summary  Ashley [name] [id number] DOB: Feb 18, 1951 DOA: 07/14/2019  PCP: Sonia Side., FNP  Admit date: 07/14/2019 Discharge date: 07/20/2019  Admitted From: Home Disposition: Home  Recommendations for Outpatient Follow-up:  1. Follow up with PCP in 1 week 2. Please obtain BMP/CBC in 1 week    Discharge Condition: Fair CODE STATUS: Full Diet recommendation: Renal diet, sodium restriction  Brief/Interim Summary: Patient is a 69 year old female with a past medical history of end-stage renal disease on hemodialysis TTS, recurrent pancreatitis, dyslipidemia, hepatitis C, and hypertension who was recently discharged on 2/1 for pancreatitis. She had worsening of pain after going home and came back on 2/4 where she was readmitted for pancreatitis.    Throughout her stay, her appetite steadily returned and pain improved, though she developed low oxygen saturations with ambulation.  She required less pain medication, her oxygenation also improved.  Subjective on day of discharge: Patient's pain improved even more.  Over the last 24 hours, she was able to ambulate without desaturating off of oxygen.  She is tolerating an oral diet.  She is ready to go home.  Discharge Diagnoses:  Principal Problem:   Acute on chronic pancreatitis (HCC) Active Problems:   Hepatitis C   Dyslipidemia   Hypertension   ESRD (end stage renal disease) on dialysis ([place])   Pancreatitis   Hyperkalemia   Acute respiratory failure [facility])  Discharge Instructions  Discharge Instructions    Call MD for:  difficulty breathing, headache or visual disturbances   Complete by: As directed    Call MD for:  persistant nausea and vomiting   Complete by: As directed    Call MD for:  severe uncontrolled pain   Complete by: As directed    Call MD for:  temperature >100.4   Complete by: As directed    Diet - low sodium heart healthy   Complete by: As directed    Increase activity slowly   Complete  by: As directed      Allergies as of 07/20/2019      Reactions   Aspirin Nausea And Vomiting   Stomach ache   Ibuprofen Nausea And Vomiting   Stomach ache      Medication List    STOP taking these medications   oxyCODONE 5 MG immediate release tablet Commonly known as: Roxicodone     TAKE these medications   amLODipine 10 MG tablet Commonly known as: NORVASC Take 1 tablet (10 mg total) by mouth daily.   cloNIDine 0.1 MG tablet Commonly known as: CATAPRES Take 0.1 mg by mouth 2 (two) times daily.   DIALYVITE 800 WITH ZINC 0.8 MG Tabs Take 1 tablet by mouth daily.   feeding supplement (NEPRO CARB STEADY) Liqd Take 237 mLs by mouth 3 (three) times daily as needed (Supplement).   metoprolol succinate 25 MG 24 hr tablet Commonly known as: TOPROL-XL Take 25 mg by mouth daily.   Sofosbuvir-Velpatasvir 400-100 MG Tabs Commonly known as: Epclusa Take 1 tablet by mouth daily. Take 1 tablet by mouth daily.   traMADol 50 MG tablet Commonly known as: Ultram Take 1 tablet (50 mg total) by mouth every 12 (twelve) hours as needed for up to 5 days for moderate pain.            Durable Medical Equipment  (From admission, onward)         Start     Ordered   07/15/19 1524  For home use only DME Walker rolling  Once    Question Answer Comment  Walker: With [place]   Patient needs a walker to treat with the following condition Weakness      07/15/19 1523         Follow-up Information    [facility].   Specialty: Home Health Services Contact information: [place] [phone]          Allergies  Allergen Reactions  . Aspirin Nausea And Vomiting    Stomach ache  . Ibuprofen Nausea And Vomiting    Stomach ache    Consultations:  Nephrology   Procedures/Studies: CT ABDOMEN PELVIS WO CONTRAST  Result Date: 07/09/2019 CLINICAL DATA:  Abdominal pain and vomiting. Elevated lipase and history of  pancreatitis. EXAM: CT ABDOMEN AND PELVIS WITHOUT CONTRAST TECHNIQUE: Multidetector CT imaging of the abdomen and pelvis was performed following the standard protocol without IV contrast. COMPARISON:  03/01/2019 FINDINGS: Lower chest: Stable bibasilar scarring. Hepatobiliary: No focal liver abnormality is seen. No gallstones, gallbladder wall thickening, or biliary dilatation. Pancreas: Peripancreatic inflammation present around the head and body of the pancreas. No abnormal fluid collections. No pancreatic calcifications or significant pancreatic ductal dilatation. Spleen: Normal in size without focal abnormality. Adrenals/Urinary Tract: Adrenal glands are unremarkable. Kidneys are normal, without renal calculi, focal lesion, or hydronephrosis. Bladder is decompressed. Stomach/Bowel: Bowel shows no evidence of obstruction, ileus, inflammation or lesion. The appendix is normal. No free air identified. Vascular/Lymphatic: Stable densely calcified plaque in the abdominal aorta and iliac arteries without evidence of aneurysmal disease. Reproductive: Status post hysterectomy. No adnexal masses. Other: No abdominal wall hernia or abnormality. No abdominopelvic ascites. Musculoskeletal: No acute or significant osseous findings. IMPRESSION: Peripancreatic inflammation around the head and body of the pancreas. Findings are consistent with acute pancreatitis. Electronically Signed   By: Aletta Edouard M.D.   On: 07/09/2019 12:17   DG Chest 2 View  Result Date: 07/14/2019 CLINICAL DATA:  Hypoxia, abdominal pain EXAM: CHEST - 2 VIEW COMPARISON:  07/09/2019 FINDINGS: The heart size and mediastinal contours are within normal limits. Calcific aortic knob. No focal airspace consolidation, pleural effusion, or pneumothorax. The visualized skeletal structures are unremarkable. Vascular stent within the left upper extremity. IMPRESSION: No active cardiopulmonary disease. Electronically Signed   By: Davina Poke D.O.   On:  07/14/2019 10:04   MR ABDOMEN MRCP WO CONTRAST  Result Date: 07/09/2019 CLINICAL DATA:  Abdominal pain and vomiting. Acute pancreatitis. End-stage renal disease. EXAM: MRI ABDOMEN WITHOUT CONTRAST  (INCLUDING MRCP) TECHNIQUE: Multiplanar multisequence MR imaging of the abdomen was performed. Heavily T2-weighted images of the biliary and pancreatic ducts were obtained, and three-dimensional MRCP images were rendered by post processing. COMPARISON:  Noncontrast CT on 07/09/2019 FINDINGS: Lower chest: No acute findings. Hepatobiliary: No masses visualized on this unenhanced exam. Diffuse T2 hypointensity of hepatic parenchyma is consistent with iron overload. Gallbladder is unremarkable. Common bile duct measures 7 mm in diameter which is within normal limits for age. No evidence of choledocholithiasis or biliary stricture. Pancreas: No evidence of pancreatic mass. Mild pancreatic and peripancreatic edema is consistent with acute pancreatitis. No peripancreatic fluid collections. Spleen: Within normal limits in size. Diffuse T2 hypointensity of parenchyma is consistent with iron overload. Adrenals/Urinary tract: Normal adrenal glands. 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 No evidence of renal mass or hydronephrosis. Stomach/Bowel: Visualized portion unremarkable. Vascular/Lymphatic: No pathologically enlarged lymph nodes identified. No evidence of  abdominal aortic aneurysm. Other:  None. Musculoskeletal:  No suspicious bone lesions identified. IMPRESSION: Mild acute pancreatitis. No evidence of pancreatic necrosis, pseudocyst, or other complication. No evidence of biliary ductal dilatation or choledocholithiasis. Hemosiderosis. Electronically Signed   By: Marlaine Hind M.D.   On: 07/09/2019 18:30   MR 3D Recon At Scanner  Result Date: 07/09/2019 CLINICAL DATA:  Abdominal pain and vomiting. Acute pancreatitis. End-stage  renal disease. EXAM: MRI ABDOMEN WITHOUT CONTRAST  (INCLUDING MRCP) TECHNIQUE: Multiplanar multisequence MR imaging of the abdomen was performed. Heavily T2-weighted images of the biliary and pancreatic ducts were obtained, and three-dimensional MRCP images were rendered by post processing. COMPARISON:  Noncontrast CT on 07/09/2019 FINDINGS: Lower chest: No acute findings. Hepatobiliary: No masses visualized on this unenhanced exam. Diffuse T2 hypointensity of hepatic parenchyma is consistent with iron overload. Gallbladder is unremarkable. Common bile duct measures 7 mm in diameter which is within normal limits for age. No evidence of choledocholithiasis or biliary stricture. Pancreas: No evidence of pancreatic mass. Mild pancreatic and peripancreatic edema is consistent with acute pancreatitis. No peripancreatic fluid collections. Spleen: Within normal limits in size. Diffuse T2 hypointensity of parenchyma is consistent with iron overload. Adrenals/Urinary tract: Normal adrenal glands. 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 No evidence of renal mass or hydronephrosis. Stomach/Bowel: Visualized portion unremarkable. Vascular/Lymphatic: No pathologically enlarged lymph nodes identified. No evidence of abdominal aortic aneurysm. Other:  None. Musculoskeletal:  No suspicious bone lesions identified. IMPRESSION: Mild acute pancreatitis. No evidence of pancreatic necrosis, pseudocyst, or other complication. No evidence of biliary ductal dilatation or choledocholithiasis. Hemosiderosis. Electronically Signed   By: Marlaine Hind M.D.   On: 07/09/2019 18:30   DG CHEST PORT 1 VIEW  Result Date: 07/18/2019 CLINICAL DATA:  Hypoxia EXAM: PORTABLE CHEST 1 VIEW COMPARISON:  07/14/2019 FINDINGS: Heart and mediastinal contours are within normal limits. No focal opacities or effusions. No acute bony abnormality. IMPRESSION: No active  disease. Electronically Signed   By: Rolm Baptise M.D.   On: 07/18/2019 15:07   DG Chest Portable 1 View  Result Date: 07/09/2019 CLINICAL DATA:  Chest and upper abdominal pain. Shortness of breath. EXAM: PORTABLE CHEST 1 VIEW COMPARISON:  02/15/2019 FINDINGS: Numerous leads and wires project over the chest. Left upper extremity vascular stent. Midline trachea. Normal heart size. Tortuous thoracic aorta. Atherosclerosis in the transverse aorta. No pleural effusion or pneumothorax. Clear lungs. IMPRESSION: No acute cardiopulmonary disease. Aortic Atherosclerosis (ICD10-I70.0). Electronically Signed   By: Abigail Miyamoto M.D.   On: 07/09/2019 10:10      Discharge Exam: Vitals:   07/19/19 2327 07/20/19 0544  BP: (!) 164/66 (!) 161/65  Pulse: (!) 54 (!) 54  Resp: 17 16  Temp: 98.6 F (37 C) 99 F (37.2 C)  SpO2: 91% 91%    General: Pt is alert, awake, not in acute distress Cardiovascular: RRR, no edema Respiratory: CTA bilaterally, no wheezing, no rhonchi, no respiratory distress, no conversational dyspnea  Abdominal: Soft, mild epigastric tenderness to palpation ND, bowel sounds + Extremities: no edema, no cyanosis Psych: Normal mood and affect, stable judgement and insight     The results of significant diagnostics from this hospitalization (including imaging, microbiology, ancillary and laboratory) are listed below for reference.     Microbiology: Recent Results (from the past 240 hour(s))  SARS CORONAVIRUS 2 (TAT 6-24 HRS) Nasopharyngeal Nasopharyngeal Swab     Status: None   Collection Time: 07/14/19  1:02 PM  Specimen: Nasopharyngeal Swab  Result Value Ref Range Status   SARS Coronavirus 2 NEGATIVE NEGATIVE Final    Comment: (NOTE) SARS-CoV-2 target nucleic acids are NOT DETECTED. The SARS-CoV-2 RNA is generally detectable in upper and lower respiratory specimens during the acute phase of infection. Negative results do not preclude SARS-CoV-2 infection, do not rule  out co-infections with other pathogens, and should not be used as the sole basis for treatment or other patient management decisions. Negative results must be combined with clinical observations, patient history, and epidemiological information. The expected result is Negative. Fact Sheet for Patients: SugarRoll.be Fact Sheet for Healthcare Providers: https://www.woods-mathews.com/ This test is not yet approved or cleared by the [place] FDA and  has been authorized for detection and/or diagnosis of SARS-CoV-2 by FDA under an Emergency Use Authorization (EUA). This EUA will remain  in effect (meaning this test can be used) for the duration of the COVID-19 declaration under Section 56 4(b)(1) of the Act, 21 U.S.C. section 360bbb-3(b)(1), unless the authorization is terminated or revoked sooner. Performed at [facility], [place] [address] [place], [place] [address]      Labs: Basic Metabolic Panel: Recent Labs  Lab 07/16/19 0744 07/17/19 0620 07/18/19 0810 07/19/19 1523 07/20/19 0519  NA 134* 137 136 133* 137  K 4.2 4.3 4.1 4.9 4.5  CL 91* 98 96* 92* 99  CO2 27 27 26 22 27   GLUCOSE 156* 90 170* 136* 90  BUN 32* 12 23 33* 10  CREATININE 10.51* 6.51* 9.26* 11.34* 5.78*  CALCIUM 9.5 9.7 9.1 9.0 9.4  PHOS 8.6*  --   --  7.2*  --    Liver Function Tests: Recent Labs  Lab 07/14/19 0833 07/15/19 0714 07/16/19 0744  AST 17 15  --   ALT 11 9  --   ALKPHOS 53 58  --   BILITOT 0.3 0.7  --   PROT 7.5 8.0  --   ALBUMIN 3.4* 3.3* 3.1*   Recent Labs  Lab 07/14/19 0833 07/14/19 1717  LIPASE 79* 82*   CBC: Recent Labs  Lab 07/14/19 0915 07/14/19 1717 07/16/19 0743 07/17/19 0620 07/18/19 0810 07/19/19 1523 07/20/19 0519  WBC 7.5   < > 6.0 5.8 4.8 5.8 5.3  NEUTROABS 4.2  --   --   --   --   --   --   HGB 11.7*   < > 12.3 12.3 11.0* 10.6* 11.4*  HCT 36.5   < > 38.4 38.2 34.5* 32.9* 36.1  MCV 86.7   < > 87.3 87.0  87.6 88.4 88.0  PLT 202   < > 273 265 275 303 291   < > = values in this interval not displayed.   Urinalysis    Component Value Date/Time   COLORURINE YELLOW 12/31/2018 1014   APPEARANCEUR CLEAR 12/31/2018 1014   LABSPEC 1.008 12/31/2018 1014   PHURINE 9.0 (H) 12/31/2018 1014   GLUCOSEU NEGATIVE 12/31/2018 1014   HGBUR SMALL (A) 12/31/2018 [place] 12/31/2018 1014   BILIRUBINUR NEG 01/29/2016 1142   KETONESUR NEGATIVE 12/31/2018 1014   PROTEINUR >=300 (A) 12/31/2018 1014   UROBILINOGEN 0.2 01/29/2016 1142   UROBILINOGEN 0.2 03/21/2015 1649   NITRITE NEGATIVE 12/31/2018 [place] 12/31/2018 1014   Microbiology Recent Results (from the past 240 hour(s))  SARS CORONAVIRUS 2 (TAT 6-24 HRS) Nasopharyngeal Nasopharyngeal Swab     Status: None   Collection Time: 07/14/19  1:02 PM   Specimen:  Nasopharyngeal Swab  Result Value Ref Range Status   SARS Coronavirus 2 NEGATIVE NEGATIVE Final    Comment: (NOTE) SARS-CoV-2 target nucleic acids are NOT DETECTED. The SARS-CoV-2 RNA is generally detectable in upper and lower respiratory specimens during the acute phase of infection. Negative results do not preclude SARS-CoV-2 infection, do not rule out co-infections with other pathogens, and should not be used as the sole basis for treatment or other patient management decisions. Negative results must be combined with clinical observations, patient history, and epidemiological information. The expected result is Negative. Fact Sheet for Patients: SugarRoll.be Fact Sheet for Healthcare Providers: https://www.woods-mathews.com/ This test is not yet approved or cleared by the [place] FDA and  has been authorized for detection and/or diagnosis of SARS-CoV-2 by FDA under an Emergency Use Authorization (EUA). This EUA will remain  in effect (meaning this test can be used) for the duration of the COVID-19  declaration under Section 56 4(b)(1) of the Act, 21 U.S.C. section 360bbb-3(b)(1), unless the authorization is terminated or revoked sooner. Performed at [facility], [place] [address] [place], [place] [address]      Patient was seen and examined on the day of discharge and was found to be in stable condition. Time coordinating discharge: 35 minutes including assessment and coordination of care, as well as examination of the patient.   SIGNED:  Shelda Pal, DO [facility] 07/20/2019, 12:31 PM

## 2019-07-21 LAB — BASIC METABOLIC PANEL
Anion gap: 11 (ref 5–15)
BUN: 10 mg/dL (ref 8–23)
CO2: 27 mmol/L (ref 22–32)
Calcium: 9.4 mg/dL (ref 8.9–10.3)
Chloride: 99 mmol/L (ref 98–111)
Creatinine, Ser: 5.78 mg/dL — ABNORMAL HIGH (ref 0.44–1.00)
GFR calc Af Amer: 8 mL/min — ABNORMAL LOW (ref >60)
GFR calc non Af Amer: 7 mL/min — ABNORMAL LOW (ref >60)
Glucose, Bld: 90 mg/dL (ref 70–99)
Potassium: 4.5 mmol/L (ref 3.5–5.1)
Sodium: 137 mmol/L (ref 135–145)

## 2019-07-25 ENCOUNTER — Encounter: Payer: Self-pay | Admitting: General Practice

## 2019-07-25 ENCOUNTER — Ambulatory Visit: Payer: Medicare Other | Admitting: Pharmacist

## 2019-08-24 IMAGING — CT CT HEAD W/O CM
3 series · 14 of 47 positions shown, 16 images · non-contrast
Comparison: 04/19/2009

CLINICAL DATA: Right-sided weakness and dizziness

EXAM:
CT HEAD WITHOUT CONTRAST
TECHNIQUE: Contiguous axial images were obtained from the base of the skull
through the vertex without intravenous contrast.

[Series 3: head 5.0 h30s · axial · 0.44mm/px · z∈[-173,-48]mm · 8 of 31 slices shown, 10 images]
[im 3/31  brain]
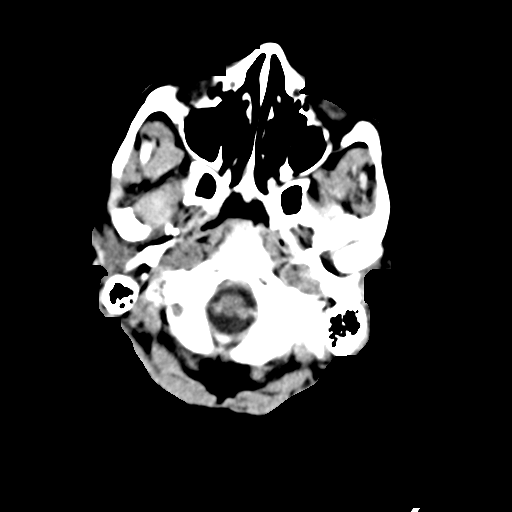
[im 3/31  bone]
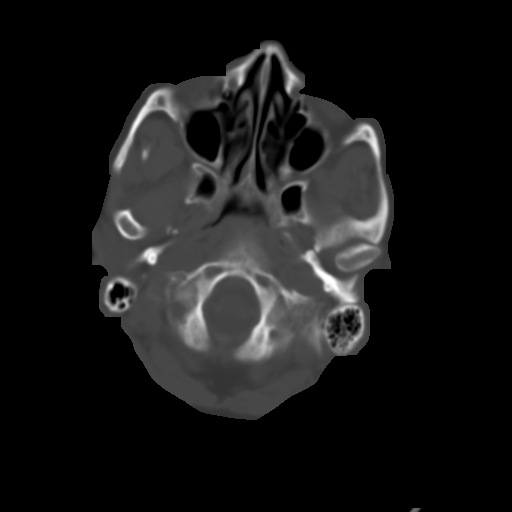
[im 7/31  brain]
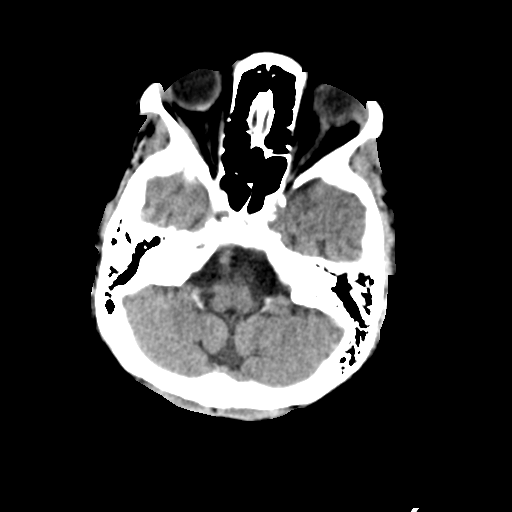
[im 10/31  brain]
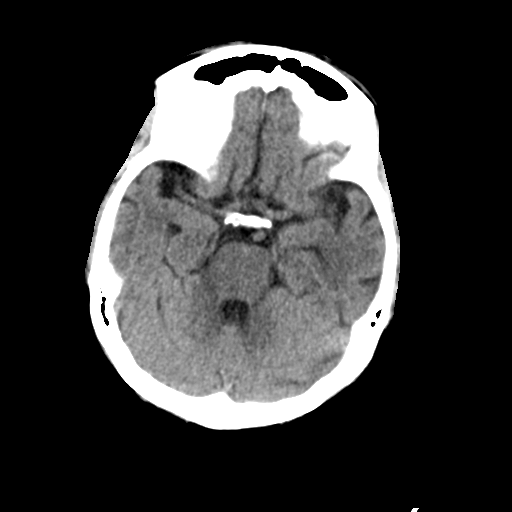
[im 14/31  brain]
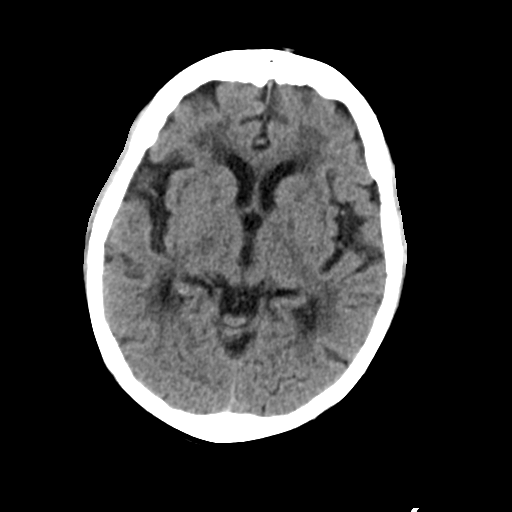
[im 17/31  brain]
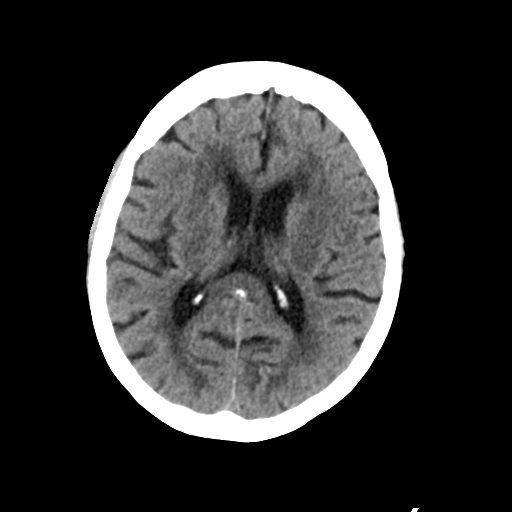
[im 17/31  bone]
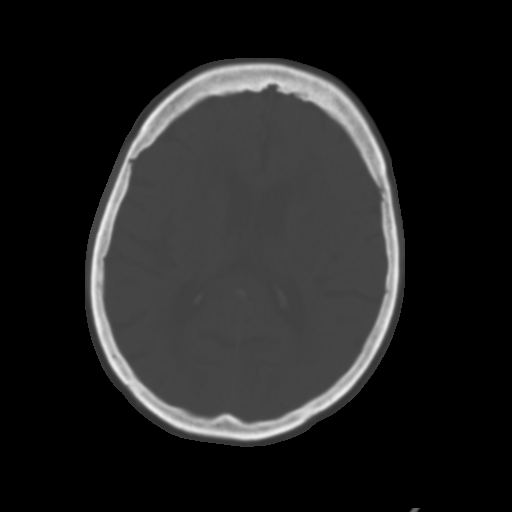
[im 21/31  brain]
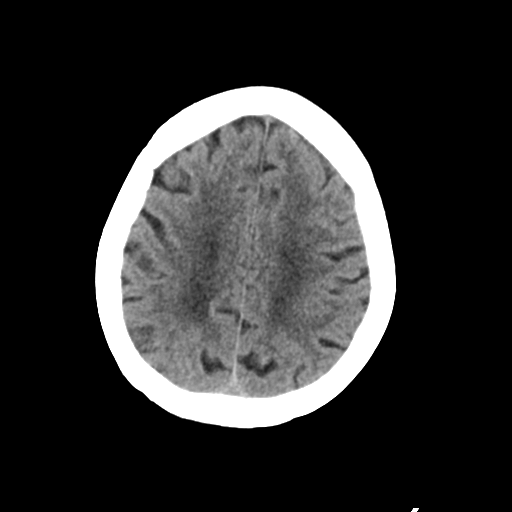
[im 24/31  brain]
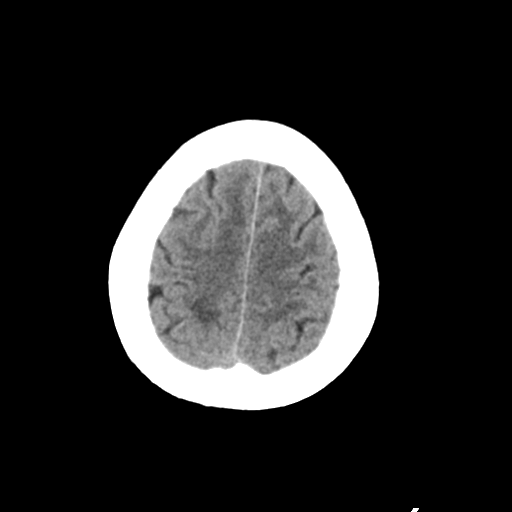
[im 28/31  brain]
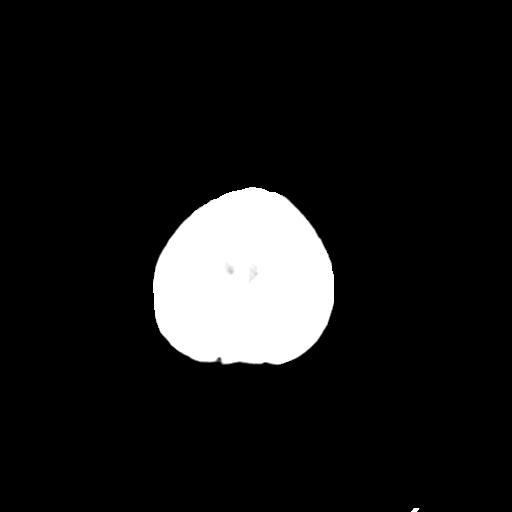

[Series 5: head 3.0 mpr cor · coronal · 0.30mm/px · 3 of 67 slices shown]
[im 23/67  brain]
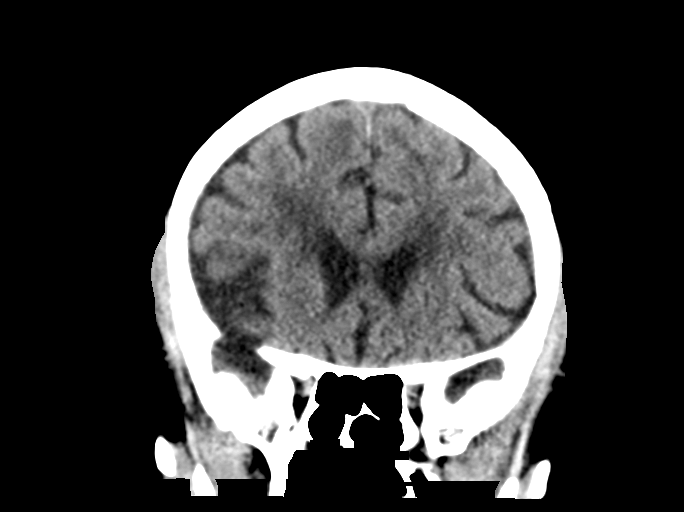
[im 30/67  brain]
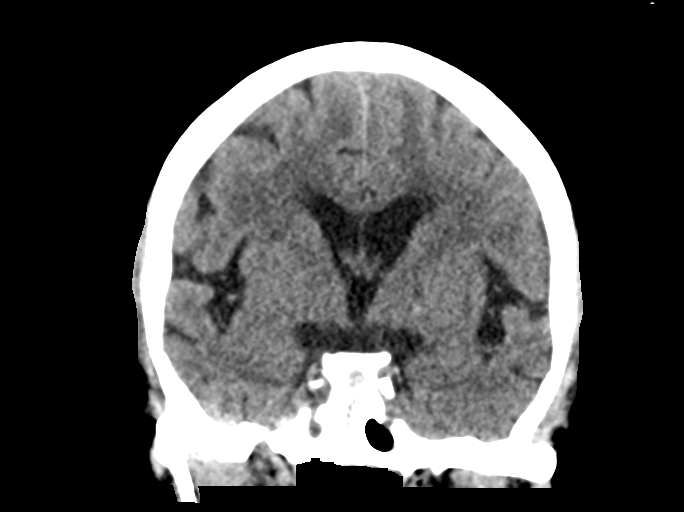
[im 37/67  brain]
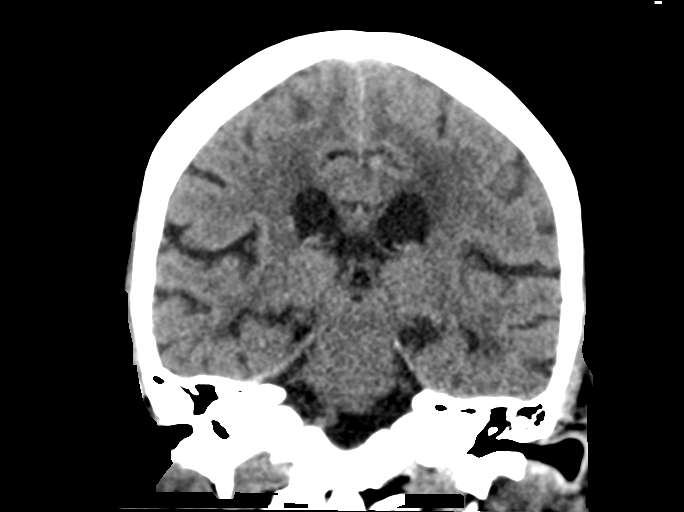

[Series 6: head 3.0 mpr sag · sagittal · 0.30mm/px · 3 of 67 slices shown]
[im 23/67  brain]
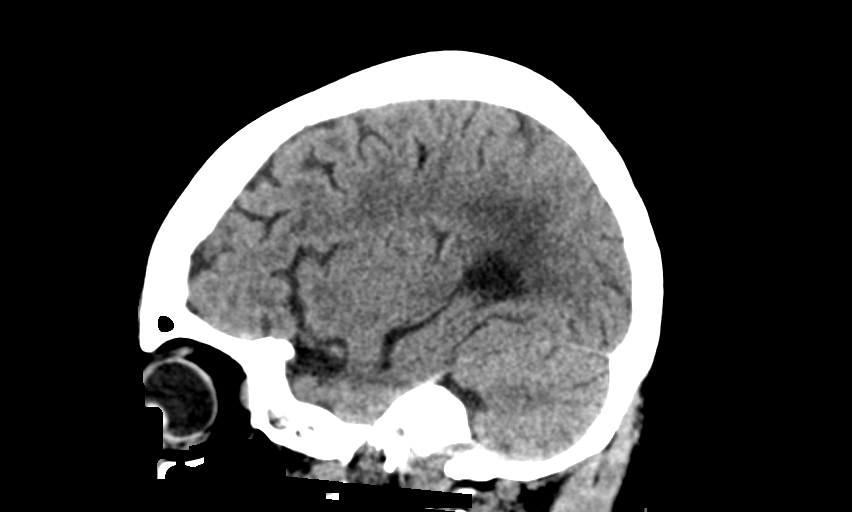
[im 34/67  brain]
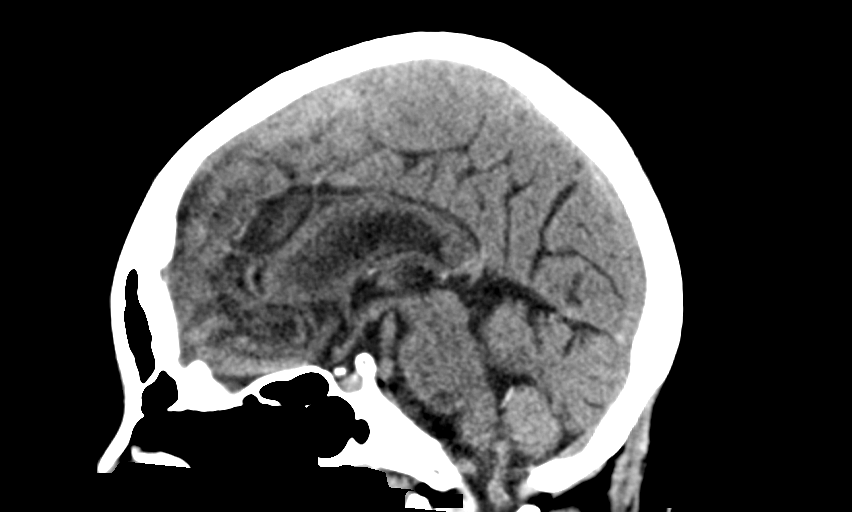
[im 45/67  brain]
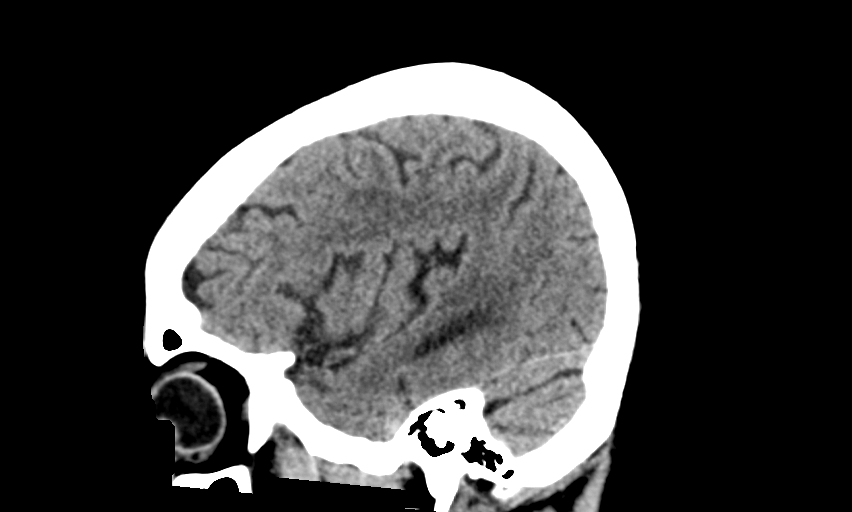

[14 of 47 positions shown; findings below may reference images not displayed]

FINDINGS: Brain: There is no evidence for acute hemorrhage, hydrocephalus,
mass lesion, or abnormal extra-axial fluid collection. No definite
CT evidence for acute infarction. Diffuse loss of parenchymal volume
is consistent with atrophy. Patchy low attenuation in the deep
hemispheric and periventricular white matter is nonspecific, but
likely reflects chronic microvascular ischemic demyelination.

Vascular: No hyperdense vessel or unexpected calcification.

Skull: No evidence for fracture. No worrisome lytic or sclerotic
lesion.

Sinuses/Orbits: The visualized paranasal sinuses and mastoid air
cells are clear. Visualized portions of the globes and intraorbital
fat are unremarkable.

Other: None.
IMPRESSION: 1. No acute intracranial abnormality.
2. Atrophy with chronic small vessel white matter ischemic disease.

## 2019-09-05 ENCOUNTER — Inpatient Hospital Stay (HOSPITAL_COMMUNITY)
Admission: EM | Admit: 2019-09-05 | Discharge: 2019-09-11 | DRG: 438 | Disposition: A | Discharge status: Home / Self Care | Payer: Medicare Other | Attending: Internal Medicine | Admitting: Internal Medicine

## 2019-09-05 ENCOUNTER — Emergency Department (HOSPITAL_COMMUNITY): Payer: Medicare Other

## 2019-09-05 ENCOUNTER — Other Ambulatory Visit: Payer: Self-pay

## 2019-09-05 ENCOUNTER — Encounter (HOSPITAL_COMMUNITY): Payer: Self-pay | Admitting: Emergency Medicine

## 2019-09-05 ENCOUNTER — Other Ambulatory Visit: Payer: Self-pay | Admitting: Physician Assistant

## 2019-09-05 DIAGNOSIS — G8929 Other chronic pain: Secondary | ICD-10-CM | POA: Diagnosis present

## 2019-09-05 DIAGNOSIS — F17200 Nicotine dependence, unspecified, uncomplicated: Secondary | ICD-10-CM

## 2019-09-05 DIAGNOSIS — B1920 Unspecified viral hepatitis C without hepatic coma: Secondary | ICD-10-CM | POA: Diagnosis present

## 2019-09-05 DIAGNOSIS — R1013 Epigastric pain: Secondary | ICD-10-CM | POA: Diagnosis not present

## 2019-09-05 DIAGNOSIS — K859 Acute pancreatitis without necrosis or infection, unspecified: Secondary | ICD-10-CM | POA: Diagnosis present

## 2019-09-05 DIAGNOSIS — Z79899 Other long term (current) drug therapy: Secondary | ICD-10-CM

## 2019-09-05 DIAGNOSIS — K861 Other chronic pancreatitis: Secondary | ICD-10-CM | POA: Diagnosis not present

## 2019-09-05 DIAGNOSIS — D631 Anemia in chronic kidney disease: Secondary | ICD-10-CM | POA: Diagnosis present

## 2019-09-05 DIAGNOSIS — K219 Gastro-esophageal reflux disease without esophagitis: Secondary | ICD-10-CM | POA: Diagnosis present

## 2019-09-05 DIAGNOSIS — F1210 Cannabis abuse, uncomplicated: Secondary | ICD-10-CM | POA: Diagnosis present

## 2019-09-05 DIAGNOSIS — K2970 Gastritis, unspecified, without bleeding: Secondary | ICD-10-CM

## 2019-09-05 DIAGNOSIS — K2990 Gastroduodenitis, unspecified, without bleeding: Secondary | ICD-10-CM | POA: Diagnosis present

## 2019-09-05 DIAGNOSIS — F17210 Nicotine dependence, cigarettes, uncomplicated: Secondary | ICD-10-CM | POA: Diagnosis present

## 2019-09-05 DIAGNOSIS — I120 Hypertensive chronic kidney disease with stage 5 chronic kidney disease or end stage renal disease: Secondary | ICD-10-CM | POA: Diagnosis present

## 2019-09-05 DIAGNOSIS — R748 Abnormal levels of other serum enzymes: Secondary | ICD-10-CM

## 2019-09-05 DIAGNOSIS — M1990 Unspecified osteoarthritis, unspecified site: Secondary | ICD-10-CM | POA: Diagnosis present

## 2019-09-05 DIAGNOSIS — Z8249 Family history of ischemic heart disease and other diseases of the circulatory system: Secondary | ICD-10-CM

## 2019-09-05 DIAGNOSIS — K449 Diaphragmatic hernia without obstruction or gangrene: Secondary | ICD-10-CM | POA: Diagnosis present

## 2019-09-05 DIAGNOSIS — Z20822 Contact with and (suspected) exposure to covid-19: Secondary | ICD-10-CM | POA: Diagnosis present

## 2019-09-05 DIAGNOSIS — K31819 Angiodysplasia of stomach and duodenum without bleeding: Secondary | ICD-10-CM

## 2019-09-05 DIAGNOSIS — Z8711 Personal history of peptic ulcer disease: Secondary | ICD-10-CM

## 2019-09-05 DIAGNOSIS — R109 Unspecified abdominal pain: Secondary | ICD-10-CM | POA: Diagnosis present

## 2019-09-05 DIAGNOSIS — Z83438 Family history of other disorder of lipoprotein metabolism and other lipidemia: Secondary | ICD-10-CM

## 2019-09-05 DIAGNOSIS — Z8673 Personal history of transient ischemic attack (TIA), and cerebral infarction without residual deficits: Secondary | ICD-10-CM

## 2019-09-05 DIAGNOSIS — F1411 Cocaine abuse, in remission: Secondary | ICD-10-CM | POA: Diagnosis present

## 2019-09-05 DIAGNOSIS — K2980 Duodenitis without bleeding: Secondary | ICD-10-CM | POA: Diagnosis present

## 2019-09-05 DIAGNOSIS — R001 Bradycardia, unspecified: Secondary | ICD-10-CM | POA: Diagnosis present

## 2019-09-05 DIAGNOSIS — E8889 Other specified metabolic disorders: Secondary | ICD-10-CM | POA: Diagnosis present

## 2019-09-05 DIAGNOSIS — Z992 Dependence on renal dialysis: Secondary | ICD-10-CM

## 2019-09-05 DIAGNOSIS — R791 Abnormal coagulation profile: Secondary | ICD-10-CM | POA: Diagnosis present

## 2019-09-05 DIAGNOSIS — N2581 Secondary hyperparathyroidism of renal origin: Secondary | ICD-10-CM | POA: Diagnosis present

## 2019-09-05 DIAGNOSIS — N186 End stage renal disease: Secondary | ICD-10-CM | POA: Diagnosis present

## 2019-09-05 LAB — CBC WITH DIFFERENTIAL/PLATELET
Abs Immature Granulocytes: 0.03 10*3/uL (ref 0.00–0.07)
Basophils Absolute: 0 10*3/uL (ref 0.0–0.1)
Basophils Relative: 0 %
Eosinophils Absolute: 0.3 10*3/uL (ref 0.0–0.5)
Eosinophils Relative: 3 %
HCT: 41.3 % (ref 36.0–46.0)
Hemoglobin: 13.7 g/dL (ref 12.0–15.0)
Immature Granulocytes: 0 %
Lymphocytes Relative: 30 %
Lymphs Abs: 2.7 10*3/uL (ref 0.7–4.0)
MCH: 30.2 pg (ref 26.0–34.0)
MCHC: 33.2 g/dL (ref 30.0–36.0)
MCV: 91.2 fL (ref 80.0–100.0)
Monocytes Absolute: 0.8 10*3/uL (ref 0.1–1.0)
Monocytes Relative: 9 %
Neutro Abs: 5.2 10*3/uL (ref 1.7–7.7)
Neutrophils Relative %: 58 %
Platelets: 259 10*3/uL (ref 150–400)
RBC: 4.53 MIL/uL (ref 3.87–5.11)
RDW: 18.6 % — ABNORMAL HIGH (ref 11.5–15.5)
WBC: 9.1 10*3/uL (ref 4.0–10.5)
nRBC: 0 % (ref 0.0–0.2)

## 2019-09-05 LAB — TROPONIN I (HIGH SENSITIVITY)
Troponin I (High Sensitivity): 5 ng/L (ref <18)
Troponin I (High Sensitivity): 3 ng/L (ref <18)

## 2019-09-05 LAB — COMPREHENSIVE METABOLIC PANEL
ALT: 13 U/L (ref 0–44)
AST: 16 U/L (ref 15–41)
Albumin: 3.9 g/dL (ref 3.5–5.0)
Alkaline Phosphatase: 59 U/L (ref 38–126)
Anion gap: 18 — ABNORMAL HIGH (ref 5–15)
BUN: 49 mg/dL — ABNORMAL HIGH (ref 8–23)
CO2: 31 mmol/L (ref 22–32)
Calcium: 10.1 mg/dL (ref 8.9–10.3)
Chloride: 88 mmol/L — ABNORMAL LOW (ref 98–111)
Creatinine, Ser: 11.46 mg/dL — ABNORMAL HIGH (ref 0.44–1.00)
GFR calc Af Amer: 3 mL/min — ABNORMAL LOW (ref >60)
GFR calc non Af Amer: 3 mL/min — ABNORMAL LOW (ref >60)
Glucose, Bld: 111 mg/dL — ABNORMAL HIGH (ref 70–99)
Potassium: 5.3 mmol/L — ABNORMAL HIGH (ref 3.5–5.1)
Sodium: 137 mmol/L (ref 135–145)
Total Bilirubin: 0.8 mg/dL (ref 0.3–1.2)
Total Protein: 8.6 g/dL — ABNORMAL HIGH (ref 6.5–8.1)

## 2019-09-05 LAB — LIPASE, BLOOD: Lipase: 85 U/L — ABNORMAL HIGH (ref 11–51)

## 2019-09-05 LAB — SARS CORONAVIRUS 2 (TAT 6-24 HRS): SARS Coronavirus 2: NEGATIVE

## 2019-09-05 MED ORDER — LIDOCAINE-PRILOCAINE 2.5-2.5 % EX CREA
1.0000 "application " | TOPICAL_CREAM | CUTANEOUS | Status: DC
  Filled 2019-09-05: qty 5

## 2019-09-05 MED ORDER — ONDANSETRON HCL 4 MG/2ML IJ SOLN: 4.0000 mg | Freq: Four times a day (QID) | INTRAMUSCULAR | Status: DC | PRN

## 2019-09-05 MED ORDER — HEPARIN SODIUM (PORCINE) 5000 UNIT/ML IJ SOLN
5000.0000 [IU] | Freq: Two times a day (BID) | INTRAMUSCULAR | Status: DC
  Administered 2019-09-05 – 2019-09-11 (×11): 5000 [IU] via SUBCUTANEOUS
  Filled 2019-09-05 (×11): qty 1

## 2019-09-05 MED ORDER — MORPHINE SULFATE (PF) 4 MG/ML IV SOLN
4.0000 mg | Freq: Once | INTRAVENOUS | Status: AC
  Administered 2019-09-05: 11:00:00 4 mg via INTRAVENOUS
  Filled 2019-09-05: qty 1

## 2019-09-05 MED ORDER — SODIUM CHLORIDE 0.9 % IV SOLN: INTRAVENOUS | Status: DC

## 2019-09-05 MED ORDER — SODIUM ZIRCONIUM CYCLOSILICATE 10 G PO PACK
10.0000 g | PACK | Freq: Once | ORAL | Status: AC
  Administered 2019-09-05: 14:00:00 10 g via ORAL
  Filled 2019-09-05: qty 1

## 2019-09-05 MED ORDER — RENA-VITE PO TABS
1.0000 | ORAL_TABLET | Freq: Every day | ORAL | Status: DC
  Administered 2019-09-05 – 2019-09-10 (×6): 1 via ORAL
  Filled 2019-09-05 (×6): qty 1

## 2019-09-05 MED ORDER — AMLODIPINE BESYLATE 10 MG PO TABS
10.0000 mg | ORAL_TABLET | Freq: Every day | ORAL | Status: DC
  Administered 2019-09-05 – 2019-09-11 (×4): 10 mg via ORAL
  Filled 2019-09-05: qty 2
  Filled 2019-09-05 (×5): qty 1

## 2019-09-05 MED ORDER — FERRIC CITRATE 1 GM 210 MG(FE) PO TABS
210.0000 mg | ORAL_TABLET | Freq: Three times a day (TID) | ORAL | Status: DC
  Administered 2019-09-06 – 2019-09-11 (×14): 210 mg via ORAL
  Filled 2019-09-05 (×19): qty 1

## 2019-09-05 MED ORDER — DIALYVITE 800/ZINC 0.8 MG PO TABS: 1.0000 | ORAL_TABLET | Freq: Every day | ORAL | Status: DC

## 2019-09-05 MED ORDER — HYDROMORPHONE HCL 1 MG/ML IJ SOLN
0.5000 mg | INTRAMUSCULAR | Status: DC | PRN
  Administered 2019-09-05 – 2019-09-06 (×5): 0.5 mg via INTRAVENOUS
  Filled 2019-09-05 (×5): qty 1

## 2019-09-05 MED ORDER — MORPHINE SULFATE (PF) 4 MG/ML IV SOLN
4.0000 mg | Freq: Once | INTRAVENOUS | Status: AC
  Administered 2019-09-05: 10:00:00 4 mg via INTRAVENOUS
  Filled 2019-09-05: qty 1

## 2019-09-05 MED ORDER — CLONIDINE HCL 0.1 MG PO TABS
0.1000 mg | ORAL_TABLET | Freq: Two times a day (BID) | ORAL | Status: DC
  Administered 2019-09-05 – 2019-09-11 (×10): 0.1 mg via ORAL
  Filled 2019-09-05 (×12): qty 1

## 2019-09-05 MED ORDER — METOPROLOL SUCCINATE ER 25 MG PO TB24
25.0000 mg | ORAL_TABLET | Freq: Every day | ORAL | Status: DC
  Administered 2019-09-05 – 2019-09-11 (×4): 25 mg via ORAL
  Filled 2019-09-05 (×6): qty 1

## 2019-09-05 MED ORDER — DOXERCALCIFEROL 2.5 MCG PO CAPS
3.0000 ug | ORAL_CAPSULE | ORAL | Status: DC
  Filled 2019-09-05 (×3): qty 1

## 2019-09-05 MED ORDER — SODIUM CHLORIDE 0.9 % IV BOLUS
250.0000 mL | Freq: Once | INTRAVENOUS | Status: AC
  Administered 2019-09-05: 16:00:00 250 mL via INTRAVENOUS

## 2019-09-05 MED ORDER — OXYCODONE HCL 5 MG PO TABS
5.0000 mg | ORAL_TABLET | ORAL | Status: DC | PRN
  Administered 2019-09-05 – 2019-09-07 (×6): 5 mg via ORAL
  Filled 2019-09-05 (×6): qty 1

## 2019-09-05 MED ORDER — ONDANSETRON HCL 4 MG/2ML IJ SOLN
4.0000 mg | Freq: Once | INTRAMUSCULAR | Status: AC
  Administered 2019-09-05: 10:00:00 4 mg via INTRAVENOUS
  Filled 2019-09-05: qty 2

## 2019-09-05 MED ORDER — NEPRO/CARBSTEADY PO LIQD: 237.0000 mL | Freq: Three times a day (TID) | ORAL | Status: DC | PRN

## 2019-09-05 MED ORDER — ACETAMINOPHEN 325 MG PO TABS: 650.0000 mg | ORAL_TABLET | Freq: Four times a day (QID) | ORAL | Status: DC | PRN

## 2019-09-05 MED ORDER — CHLORHEXIDINE GLUCONATE CLOTH 2 % EX PADS
6.0000 | MEDICATED_PAD | Freq: Every day | CUTANEOUS | Status: DC
  Administered 2019-09-06 – 2019-09-08 (×2): 6 via TOPICAL

## 2019-09-05 MED ORDER — ACETAMINOPHEN 650 MG RE SUPP: 650.0000 mg | Freq: Four times a day (QID) | RECTAL | Status: DC | PRN

## 2019-09-05 MED ORDER — CINACALCET HCL 30 MG PO TABS
30.0000 mg | ORAL_TABLET | Freq: Every day | ORAL | Status: DC
  Filled 2019-09-05: qty 1

## 2019-09-05 MED ORDER — MORPHINE SULFATE (PF) 4 MG/ML IV SOLN
4.0000 mg | Freq: Once | INTRAVENOUS | Status: AC
  Administered 2019-09-05: 4 mg via INTRAVENOUS
  Filled 2019-09-05: qty 1

## 2019-09-05 NOTE — ED Notes (Signed)
Patient transported to X-ray 

## 2019-09-05 NOTE — ED Notes (Signed)
Report called to Manuela Schwartz RN

## 2019-09-05 NOTE — H&P (View-Only) (Signed)
Consultation  Referring Provider:  Dr. Roosevelt Locks    Primary Care Physician:  Sonia Side., FNP Primary Gastroenterologist: Dr. Hilarie Fredrickson        Reason for Consultation: Recurrent Pancreatitis             HPI:   Ashley [name] is a 69 y.o. female with a past medical history significant for chronic pancreatitis, GERD, perforated ulcer with surgical repair 1990, hepatitis C and states completed his Rx in January 2021), history of ESRD on HD, hypertension and others listed below who presented to the ED with epigastric pain for 2 days.    Today, patient explains she developed pain about 2 days ago which feels the same as her previous episodes of pancreatitis.  Describes a total of 5-6 episodes of pancreatitis since July of last year.  Also with multiple loose diarrheal stools, the last on Saturday, 09/03/19,  and nausea.  She has not ate or drank anything since last night and anytime that she does try it increases the pain. Has really had no appetite for months and never feels like things are completely resolved.    Denies fever, chills, weight loss or symptoms that awaken her from sleep.  ED course: Lipase 85; Abdominal Xray with no acute findings.  GI history: 07/09/2019 CT without contrast: Showed mild acute pancreatitis with no evidence of pancreatic necrosis, pseudocyst or other complication, no evidence of biliary ductal dilatation or choledocholithiasis, hemosiderosis 01/28/2019 office visit with Alonza Bogus, PA-C: At that visit discussed that she had recurrent pancreatitis, had one episode remotely in the past but had 2 episodes of mild pancreatitis since May of that year, nausea no source found, no evidence of gallstones, no alcohol use, IgG4 normal as well as normal triglycerides, has had multiple MRI/MRCP, EUS is also been discussed that she has never had a colonoscopy, also discussed that she had hepatitis C; plan: Scheduled for MRI/MRCP without contrast to rule out pancreatic  divisum/anatomic abnormalities, also discussed EUS to rule out microlithiasis, also gave patient Cologuard (results said that they could not be processed)  Past Medical History:  Diagnosis Date  . Acute pancreatitis 12/2018  . Arthritis   . Cervical radiculopathy 02/28/2011  . Cocaine substance abuse ([place]) 05/26/2013   positive UDS   . ESRD on hemodialysis (HCC)    TTS  . Gastropathy   . GERD (gastroesophageal reflux disease)   . Hepatitis C 1987   history of IVDA  . Hiatal hernia   . Hyperlipidemia   . Hypertension   . Marijuana abuse 12/18/204   positive UDS, family members smoke as well  . Pancreatitis 2000   resolved  . 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 Schatzki's ring   . Stroke ([place])   . Ulcer 1990   gastric ulcer. Ruptured s/p emergency repair    Past Surgical History:  Procedure Laterality Date  . ABDOMINAL HYSTERECTOMY  1979  . AV FISTULA PLACEMENT Left 06/16/2017   Procedure: ARTERIOVENOUS (AV) FISTULA CREATION LEFT ARM;  Surgeon: Conrad Hickory Ridge, MD;  Location: [facility];  Service: Vascular;  Laterality: Left;  . BASCILIC VEIN TRANSPOSITION Left 10/02/2017   Procedure: BASILIC VEIN TRANSPOSITION SECOND STAGE LEFT ARM;  Surgeon: Rosetta Posner, MD;  Location: [facility] OR;  Service: Vascular;  Laterality: Left;  . ESOPHAGOGASTRODUODENOSCOPY N/A 05/29/2013   Procedure: ESOPHAGOGASTRODUODENOSCOPY (EGD);  Surgeon: Jerene Bears, MD;  Location: [facility] ENDOSCOPY;  Service: Endoscopy;  Laterality: N/A;  . EXCHANGE OF A DIALYSIS  CATHETER Left 07/31/2017   Procedure: Removal  OF A  Right GroinTUNNELED  DIALYSIS CATHETER ,  Insertion of Left Femoral Dialysis Catheter.;  Surgeon: Rosetta Posner, MD;  Location: [facility];  Service: Vascular;  Laterality: Left;  . INSERTION OF DIALYSIS CATHETER Right 06/16/2017   Procedure: INSERTION OF DIALYSIS CATHETER;  Surgeon: Conrad Hallstead, MD;  Location: [facility];  Service: Vascular;  Laterality: Right;  . IR AV DIALY SHUNT INTRO [place]  W/PTA/IMG LEFT  06/21/2018  . REPAIR OF PERFORATED ULCER      Family History  Problem Relation Age of Onset  . Hypertension Father   . Cancer Father   . Hyperlipidemia Father   . Seizures Sister   . Early death Daughter   . Kidney disease Daughter        end stage dialysis dependent     Social History   Tobacco Use  . Smoking status: Current Every Day Smoker    Packs/day: 0.25    Years: 40.00    Pack years: 10.00    Types: Cigarettes  . Smokeless tobacco: Never Used  Substance Use Topics  . Alcohol use: No    Alcohol/week: 0.0 standard drinks  . Drug use: Not Currently    Types: Heroin, Marijuana, Cocaine    Comment: hasn't used cocaine in 1-2 years; she smokes marijuana daily, "whenever I can get it"    Prior to Admission medications   Medication Sig Start Date End Date Taking? Authorizing Provider  acetaminophen (TYLENOL) 500 MG tablet Take 1,000 mg by mouth every 6 (six) hours as needed for headache (pain).   Yes [provider]  amLODipine (NORVASC) 10 MG tablet Take 1 tablet (10 mg total) by mouth daily. Patient taking differently: Take 10 mg by mouth daily at 12 noon.  11/11/16  Yes Luiz Blare Y, DO  B Complex-C-Zn-Folic Acid (DIALYVITE 160 WITH ZINC) 0.8 MG TABS Take 1 tablet by mouth daily with breakfast.  07/07/19  Yes [provider]  cinacalcet (SENSIPAR) 30 MG tablet Take 30 mg by mouth daily with breakfast.   Yes [provider]  cloNIDine (CATAPRES) 0.1 MG tablet Take 0.1 mg by mouth 2 (two) times daily. 06/25/19  Yes [provider]  ferric citrate (AURYXIA) 1 GM 210 MG(Fe) tablet Take 210 mg by mouth 3 (three) times daily with meals.   Yes [provider]  lidocaine-prilocaine (EMLA) cream Apply 1 application topically See admin instructions. Apply small amount to access site on Tuesday, Thursday, Saturday one hour before dialysis. Cover with occlusive dressing (saran wrap) 08/25/19  Yes [provider]    Nutritional Supplements (FEEDING SUPPLEMENT, NEPRO CARB STEADY,) LIQD Take 237 mLs by mouth 3 (three) times daily as needed (Supplement). Patient taking differently: Take 237 mLs by mouth 2 (two) times daily as needed (meal supplement).  07/11/19  Yes Black, Lezlie Octave, NP    Current Facility-Administered Medications  Medication Dose Route Frequency Provider Last Rate Last Admin  . acetaminophen (TYLENOL) tablet 650 mg  650 mg Oral Q6H PRN Wynetta Fines T, MD       Or  . acetaminophen (TYLENOL) suppository 650 mg  650 mg Rectal Q6H PRN Wynetta Fines T, MD      . amLODipine (NORVASC) tablet 10 mg  10 mg Oral Daily Wynetta Fines T, MD      . Derrill Memo ON 09/06/2019] cinacalcet (SENSIPAR) tablet 30 mg  30 mg Oral Q breakfast Lequita Halt, MD      .  cloNIDine (CATAPRES) tablet 0.1 mg  0.1 mg Oral BID Wynetta Fines T, MD      . Derrill Memo ON 09/06/2019] doxercalciferol (HECTOROL) capsule 3 mcg  3 mcg Oral Q T,Th,Sa-HD Claudia Desanctis, MD      . feeding supplement (NEPRO CARB STEADY) liquid 237 mL  237 mL Oral TID PRN Wynetta Fines T, MD      . ferric citrate (AURYXIA) tablet 210 mg  210 mg Oral TID WC Wynetta Fines T, MD      . heparin injection 5,000 Units  5,000 Units Subcutaneous Q12H Lequita Halt, MD   5,000 Units at 09/05/19 1418  . HYDROmorphone (DILAUDID) injection 0.5 mg  0.5 mg Intravenous Q4H PRN Wynetta Fines T, MD   0.5 mg at 09/05/19 1419  . [START ON 09/06/2019] lidocaine-prilocaine (EMLA) cream 1 application  1 application Topical Q T,Th,Sa-HD Wynetta Fines T, MD      . metoprolol succinate (TOPROL-XL) 24 hr tablet 25 mg  25 mg Oral Daily Wynetta Fines T, MD      . multivitamin (RENA-VIT) tablet 1 tablet  1 tablet Oral QHS Michael, Melissa P, RPH      . ondansetron [facility]) injection 4 mg  4 mg Intravenous Q6H PRN Wynetta Fines T, MD      . oxyCODONE (Oxy IR/ROXICODONE) immediate release tablet 5 mg  5 mg Oral Q4H PRN Wynetta Fines T, MD      . sodium chloride 0.9 % bolus 250 mL  250 mL Intravenous Once Claudia Desanctis, MD        Allergies as of 09/05/2019 - Review Complete 09/05/2019  Allergen Reaction Noted  . Aspirin Nausea And Vomiting 07/18/2009  . Ibuprofen Nausea And Vomiting 12/24/2009     Review of Systems:    Constitutional: No weight loss, fever or chills Skin: No rash  Cardiovascular: No chest pain Respiratory: No SOB  Gastrointestinal: See HPI and otherwise negative Genitourinary: No dysuria  Neurological: No headache, dizziness or syncope Musculoskeletal: No new muscle or joint pain Hematologic: No bleeding  Psychiatric: No history of depression or anxiety    Physical Exam:  Vital signs in last 24 hours: Temp:  [98.2 F (36.8 C)-98.7 F (37.1 C)] 98.2 F (36.8 C) (03/29 1341) Pulse Rate:  [45-76] 68 (03/29 1341) Resp:  [16-17] 17 (03/29 1341) BP: (123-151)/(66-96) 147/96 (03/29 1341) SpO2:  [95 %-100 %] 100 % (03/29 1341) Weight:  [59.9 kg] 59.9 kg (03/29 0909) Last BM Date: 08/06/19 General:   Pleasant AA female appears to be in NAD, Well developed, Well nourished, alert and cooperative Head:  Normocephalic and atraumatic. Eyes:   PEERL, EOMI. No icterus. Conjunctiva pink. Ears:  Normal auditory acuity. Neck:  Supple Throat: Oral cavity and pharynx without inflammation, swelling or lesion.  Lungs: Respirations even and unlabored. Lungs clear to auscultation bilaterally.   No wheezes, crackles, or rhonchi.  Heart: Normal S1, S2. No MRG. Regular rate and rhythm. No peripheral edema, cyanosis or pallor.  Abdomen:  Soft, nondistended, marked epigastric ttp. Normal bowel sounds. No appreciable masses or hepatomegaly. Rectal:  Not performed.  Msk:  Symmetrical without gross deformities. Peripheral pulses intact.  Extremities:  Without edema, no deformity or joint abnormality.  Neurologic:  Alert and  oriented x4;  grossly normal neurologically.  Skin:   Dry and intact without significant lesions or rashes. Psychiatric: Demonstrates good judgement and reason without  abnormal affect or behaviors.  LAB RESULTS: Recent Labs    09/05/19 0912  WBC  9.1  HGB 13.7  HCT 41.3  PLT 259   BMET Recent Labs    09/05/19 0912  NA 137  K 5.3*  CL 88*  CO2 31  GLUCOSE 111*  BUN 49*  CREATININE 11.46*  CALCIUM 10.1   LFT Recent Labs    09/05/19 0912  PROT 8.6*  ALBUMIN 3.9  AST 16  ALT 13  ALKPHOS 59  BILITOT 0.8    STUDIES: DG Abd Acute W/Chest  Result Date: 09/05/2019 CLINICAL DATA:  Abdominal pain and vomiting. EXAM: DG ABDOMEN ACUTE W/ 1V CHEST COMPARISON:  Chest x-ray 03/03/2009 FINDINGS: The upright chest x-ray is unremarkable. No acute pulmonary findings, pleural effusion or pulmonary lesions. The heart is normal in size. Stable tortuosity and calcification of the thoracic aorta. A left axillary stent is noted. Two views of the abdomen demonstrate an unremarkable bowel gas pattern. No findings for obstruction or perforation. The soft tissue shadows are maintained. No worrisome calcifications. The bony structures are intact. IMPRESSION: 1. No acute cardiopulmonary findings. 2. No plain film findings for an acute abdominal process. Electronically Signed   By: Marijo Sanes M.D.   On: 09/05/2019 09:42     Impression / Plan:   Impression: 1.  Recurrent pancreatitis:  Presents with epigastric pain and nausea/vomiting, last EGD 2014, last imaging January with mild changes of pancreatitis, 5-6 episodes over the past 8 months of pancreatitis with no etiology, all work up negative, EUS has been discussed in the past for possible microlithiasis 2.  ESRD on HD 3.  Anemia in the setting of CKD  Plan: 1. Will discuss any possible further workup during this hospital stay with Dr. Carlean Purl. Likely EUS will have to be scheduled outpatient for further eval. After some discussion would also like to proceed with EGD to consider gastric origin for symptoms. Tentatively scheduled for tomorrow 900am. Dr Carlean Purl will discuss further with patient. 2. Continue  analgesics and antiemetics. 3. Continue other supportive measures 4. Please await any further recommendations from Dr. Carlean Purl later today  Thank you for your kind consultation, we will continue to follow.  Lavone Nian Lemmon  09/05/2019, 3:35 PM    [facility] Attending   I have taken an interval history, reviewed the chart and examined the patient. I agree with the Advanced Practitioner's note, impression and recommendations.   ADDITIONS:  ? If truly having recurrent pancreatitis Her lipase elevation is not more than 2 x the NL, she has ESRD, and the pancreas changes have been seen numerous times.  She may indeed have chronic pancreatitis but sometimes lipase elvation comes from the intestine or stomach and I think reasonable to do an EGd and also look at the papilla with duodenoscope to see if that is normal or not.  Hopefully her labs (K) will allow [place] to sedate and scope tomorrow at 0900 if not will need to go for Wed after HD tomorrow  An EUS may be needed but will not be possible this admission.   The risks and benefits as well as alternatives of endoscopic procedure(s) have been discussed and reviewed. All questions answered. The patient agrees to proceed.  Gatha Mayer, MD, [facility] 09/05/2019 5:06 PM

## 2019-09-05 NOTE — Plan of Care (Signed)
  Problem: Education: Goal: Knowledge of General Education information will improve Description: Including pain rating scale, medication(s)/side effects and non-pharmacologic comfort measures Outcome: Progressing   Problem: Health Behavior/Discharge Planning: Goal: Ability to manage health-related needs will improve Outcome: Progressing   Problem: Clinical Measurements: Goal: Will remain free from infection Outcome: Progressing   Problem: Nutrition: Goal: Adequate nutrition will be maintained Outcome: Progressing   Problem: Elimination: Goal: Will not experience complications related to urinary retention Outcome: Progressing   Problem: Pain Managment: Goal: General experience of comfort will improve Outcome: Progressing   Problem: Safety: Goal: Ability to remain free from injury will improve Outcome: Progressing   Problem: Skin Integrity: Goal: Risk for impaired skin integrity will decrease Outcome: Progressing   

## 2019-09-05 NOTE — Consult Note (Addendum)
Consultation  Referring Provider:  Dr. Roosevelt Locks    Primary Care Physician:  Sonia Side., FNP Primary Gastroenterologist: Dr. Hilarie Fredrickson        Reason for Consultation: Recurrent Pancreatitis             HPI:   Ashley [name] is a 69 y.o. female with a past medical history significant for chronic pancreatitis, GERD, perforated ulcer with surgical repair 1990, hepatitis C and states completed his Rx in January 2021), history of ESRD on HD, hypertension and others listed below who presented to the ED with epigastric pain for 2 days.    Today, patient explains she developed pain about 2 days ago which feels the same as her previous episodes of pancreatitis.  Describes a total of 5-6 episodes of pancreatitis since July of last year.  Also with multiple loose diarrheal stools, the last on Saturday, 09/03/19,  and nausea.  She has not ate or drank anything since last night and anytime that she does try it increases the pain. Has really had no appetite for months and never feels like things are completely resolved.    Denies fever, chills, weight loss or symptoms that awaken her from sleep.  ED course: Lipase 85; Abdominal Xray with no acute findings.  GI history: 07/09/2019 CT without contrast: Showed mild acute pancreatitis with no evidence of pancreatic necrosis, pseudocyst or other complication, no evidence of biliary ductal dilatation or choledocholithiasis, hemosiderosis 01/28/2019 office visit with Alonza Bogus, PA-C: At that visit discussed that she had recurrent pancreatitis, had one episode remotely in the past but had 2 episodes of mild pancreatitis since May of that year, nausea no source found, no evidence of gallstones, no alcohol use, IgG4 normal as well as normal triglycerides, has had multiple MRI/MRCP, EUS is also been discussed that she has never had a colonoscopy, also discussed that she had hepatitis C; plan: Scheduled for MRI/MRCP without contrast to rule out pancreatic  divisum/anatomic abnormalities, also discussed EUS to rule out microlithiasis, also gave patient Cologuard (results said that they could not be processed)  Past Medical History:  Diagnosis Date  . Acute pancreatitis 12/2018  . Arthritis   . Cervical radiculopathy 02/28/2011  . Cocaine substance abuse ([place]) 05/26/2013   positive UDS   . ESRD on hemodialysis (HCC)    TTS  . Gastropathy   . GERD (gastroesophageal reflux disease)   . Hepatitis C 1987   history of IVDA  . Hiatal hernia   . Hyperlipidemia   . Hypertension   . Marijuana abuse 12/18/204   positive UDS, family members smoke as well  . Pancreatitis 2000   resolved  . 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 Schatzki's ring   . Stroke ([place])   . Ulcer 1990   gastric ulcer. Ruptured s/p emergency repair    Past Surgical History:  Procedure Laterality Date  . ABDOMINAL HYSTERECTOMY  1979  . AV FISTULA PLACEMENT Left 06/16/2017   Procedure: ARTERIOVENOUS (AV) FISTULA CREATION LEFT ARM;  Surgeon: Conrad Dearing, MD;  Location: [place];  Service: Vascular;  Laterality: Left;  . BASCILIC VEIN TRANSPOSITION Left 10/02/2017   Procedure: BASILIC VEIN TRANSPOSITION SECOND STAGE LEFT ARM;  Surgeon: Rosetta Posner, MD;  Location: [facility] OR;  Service: Vascular;  Laterality: Left;  . ESOPHAGOGASTRODUODENOSCOPY N/A 05/29/2013   Procedure: ESOPHAGOGASTRODUODENOSCOPY (EGD);  Surgeon: Jerene Bears, MD;  Location: [facility] ENDOSCOPY;  Service: Endoscopy;  Laterality: N/A;  . EXCHANGE OF A DIALYSIS  CATHETER Left 07/31/2017   Procedure: Removal  OF A  Right GroinTUNNELED  DIALYSIS CATHETER ,  Insertion of Left Femoral Dialysis Catheter.;  Surgeon: Rosetta Posner, MD;  Location: [place];  Service: Vascular;  Laterality: Left;  . INSERTION OF DIALYSIS CATHETER Right 06/16/2017   Procedure: INSERTION OF DIALYSIS CATHETER;  Surgeon: Conrad Malvern, MD;  Location: [place];  Service: Vascular;  Laterality: Right;  . IR AV DIALY SHUNT INTRO [place]  W/PTA/IMG LEFT  06/21/2018  . REPAIR OF PERFORATED ULCER      Family History  Problem Relation Age of Onset  . Hypertension Father   . Cancer Father   . Hyperlipidemia Father   . Seizures Sister   . Early death Daughter   . Kidney disease Daughter        end stage dialysis dependent     Social History   Tobacco Use  . Smoking status: Current Every Day Smoker    Packs/day: 0.25    Years: 40.00    Pack years: 10.00    Types: Cigarettes  . Smokeless tobacco: Never Used  Substance Use Topics  . Alcohol use: No    Alcohol/week: 0.0 standard drinks  . Drug use: Not Currently    Types: Heroin, Marijuana, Cocaine    Comment: hasn't used cocaine in 1-2 years; she smokes marijuana daily, "whenever I can get it"    Prior to Admission medications   Medication Sig Start Date End Date Taking? Authorizing Provider  acetaminophen (TYLENOL) 500 MG tablet Take 1,000 mg by mouth every 6 (six) hours as needed for headache (pain).   Yes [provider]  amLODipine (NORVASC) 10 MG tablet Take 1 tablet (10 mg total) by mouth daily. Patient taking differently: Take 10 mg by mouth daily at 12 noon.  11/11/16  Yes Luiz Blare Y, DO  B Complex-C-Zn-Folic Acid (DIALYVITE 841 WITH ZINC) 0.8 MG TABS Take 1 tablet by mouth daily with breakfast.  07/07/19  Yes [provider]  cinacalcet (SENSIPAR) 30 MG tablet Take 30 mg by mouth daily with breakfast.   Yes [provider]  cloNIDine (CATAPRES) 0.1 MG tablet Take 0.1 mg by mouth 2 (two) times daily. 06/25/19  Yes [provider]  ferric citrate (AURYXIA) 1 GM 210 MG(Fe) tablet Take 210 mg by mouth 3 (three) times daily with meals.   Yes [provider]  lidocaine-prilocaine (EMLA) cream Apply 1 application topically See admin instructions. Apply small amount to access site on Tuesday, Thursday, Saturday one hour before dialysis. Cover with occlusive dressing (saran wrap) 08/25/19  Yes [provider]   Nutritional Supplements (FEEDING SUPPLEMENT, NEPRO CARB STEADY,) LIQD Take 237 mLs by mouth 3 (three) times daily as needed (Supplement). Patient taking differently: Take 237 mLs by mouth 2 (two) times daily as needed (meal supplement).  07/11/19  Yes Black, Lezlie Octave, NP    Current Facility-Administered Medications  Medication Dose Route Frequency Provider Last Rate Last Admin  . acetaminophen (TYLENOL) tablet 650 mg  650 mg Oral Q6H PRN Wynetta Fines T, MD       Or  . acetaminophen (TYLENOL) suppository 650 mg  650 mg Rectal Q6H PRN Wynetta Fines T, MD      . amLODipine (NORVASC) tablet 10 mg  10 mg Oral Daily Wynetta Fines T, MD      . Derrill Memo ON 09/06/2019] cinacalcet (SENSIPAR) tablet 30 mg  30 mg Oral Q breakfast Lequita Halt, MD      .  cloNIDine (CATAPRES) tablet 0.1 mg  0.1 mg Oral BID Wynetta Fines T, MD      . Derrill Memo ON 09/06/2019] doxercalciferol (HECTOROL) capsule 3 mcg  3 mcg Oral Q T,Th,Sa-HD Claudia Desanctis, MD      . feeding supplement (NEPRO CARB STEADY) liquid 237 mL  237 mL Oral TID PRN Wynetta Fines T, MD      . ferric citrate (AURYXIA) tablet 210 mg  210 mg Oral TID WC Wynetta Fines T, MD      . heparin injection 5,000 Units  5,000 Units Subcutaneous Q12H Lequita Halt, MD   5,000 Units at 09/05/19 1418  . HYDROmorphone (DILAUDID) injection 0.5 mg  0.5 mg Intravenous Q4H PRN Wynetta Fines T, MD   0.5 mg at 09/05/19 1419  . [START ON 09/06/2019] lidocaine-prilocaine (EMLA) cream 1 application  1 application Topical Q T,Th,Sa-HD Wynetta Fines T, MD      . metoprolol succinate (TOPROL-XL) 24 hr tablet 25 mg  25 mg Oral Daily Wynetta Fines T, MD      . multivitamin (RENA-VIT) tablet 1 tablet  1 tablet Oral QHS Michael, Melissa P, RPH      . ondansetron [facility]) injection 4 mg  4 mg Intravenous Q6H PRN Wynetta Fines T, MD      . oxyCODONE (Oxy IR/ROXICODONE) immediate release tablet 5 mg  5 mg Oral Q4H PRN Wynetta Fines T, MD      . sodium chloride 0.9 % bolus 250 mL  250 mL Intravenous Once Claudia Desanctis, MD        Allergies as of 09/05/2019 - Review Complete 09/05/2019  Allergen Reaction Noted  . Aspirin Nausea And Vomiting 07/18/2009  . Ibuprofen Nausea And Vomiting 12/24/2009     Review of Systems:    Constitutional: No weight loss, fever or chills Skin: No rash  Cardiovascular: No chest pain Respiratory: No SOB  Gastrointestinal: See HPI and otherwise negative Genitourinary: No dysuria  Neurological: No headache, dizziness or syncope Musculoskeletal: No new muscle or joint pain Hematologic: No bleeding  Psychiatric: No history of depression or anxiety    Physical Exam:  Vital signs in last 24 hours: Temp:  [98.2 F (36.8 C)-98.7 F (37.1 C)] 98.2 F (36.8 C) (03/29 1341) Pulse Rate:  [45-76] 68 (03/29 1341) Resp:  [16-17] 17 (03/29 1341) BP: (123-151)/(66-96) 147/96 (03/29 1341) SpO2:  [95 %-100 %] 100 % (03/29 1341) Weight:  [59.9 kg] 59.9 kg (03/29 0909) Last BM Date: 08/06/19 General:   Pleasant AA female appears to be in NAD, Well developed, Well nourished, alert and cooperative Head:  Normocephalic and atraumatic. Eyes:   PEERL, EOMI. No icterus. Conjunctiva pink. Ears:  Normal auditory acuity. Neck:  Supple Throat: Oral cavity and pharynx without inflammation, swelling or lesion.  Lungs: Respirations even and unlabored. Lungs clear to auscultation bilaterally.   No wheezes, crackles, or rhonchi.  Heart: Normal S1, S2. No MRG. Regular rate and rhythm. No peripheral edema, cyanosis or pallor.  Abdomen:  Soft, nondistended, marked epigastric ttp. Normal bowel sounds. No appreciable masses or hepatomegaly. Rectal:  Not performed.  Msk:  Symmetrical without gross deformities. Peripheral pulses intact.  Extremities:  Without edema, no deformity or joint abnormality.  Neurologic:  Alert and  oriented x4;  grossly normal neurologically.  Skin:   Dry and intact without significant lesions or rashes. Psychiatric: Demonstrates good judgement and reason without  abnormal affect or behaviors.  LAB RESULTS: Recent Labs    09/05/19 0912  WBC  9.1  HGB 13.7  HCT 41.3  PLT 259   BMET Recent Labs    09/05/19 0912  NA 137  K 5.3*  CL 88*  CO2 31  GLUCOSE 111*  BUN 49*  CREATININE 11.46*  CALCIUM 10.1   LFT Recent Labs    09/05/19 0912  PROT 8.6*  ALBUMIN 3.9  AST 16  ALT 13  ALKPHOS 59  BILITOT 0.8    STUDIES: DG Abd Acute W/Chest  Result Date: 09/05/2019 CLINICAL DATA:  Abdominal pain and vomiting. EXAM: DG ABDOMEN ACUTE W/ 1V CHEST COMPARISON:  Chest x-ray 03/03/2009 FINDINGS: The upright chest x-ray is unremarkable. No acute pulmonary findings, pleural effusion or pulmonary lesions. The heart is normal in size. Stable tortuosity and calcification of the thoracic aorta. A left axillary stent is noted. Two views of the abdomen demonstrate an unremarkable bowel gas pattern. No findings for obstruction or perforation. The soft tissue shadows are maintained. No worrisome calcifications. The bony structures are intact. IMPRESSION: 1. No acute cardiopulmonary findings. 2. No plain film findings for an acute abdominal process. Electronically Signed   By: Marijo Sanes M.D.   On: 09/05/2019 09:42     Impression / Plan:   Impression: 1.  Recurrent pancreatitis:  Presents with epigastric pain and nausea/vomiting, last EGD 2014, last imaging January with mild changes of pancreatitis, 5-6 episodes over the past 8 months of pancreatitis with no etiology, all work up negative, EUS has been discussed in the past for possible microlithiasis 2.  ESRD on HD 3.  Anemia in the setting of CKD  Plan: 1. Will discuss any possible further workup during this hospital stay with Dr. Carlean Purl. Likely EUS will have to be scheduled outpatient for further eval. After some discussion would also like to proceed with EGD to consider gastric origin for symptoms. Tentatively scheduled for tomorrow 900am. Dr Carlean Purl will discuss further with patient. 2. Continue  analgesics and antiemetics. 3. Continue other supportive measures 4. Please await any further recommendations from Dr. Carlean Purl later today  Thank you for your kind consultation, we will continue to follow.  Lavone Nian Lemmon  09/05/2019, 3:35 PM    [facility] Attending   I have taken an interval history, reviewed the chart and examined the patient. I agree with the Advanced Practitioner's note, impression and recommendations.   ADDITIONS:  ? If truly having recurrent pancreatitis Her lipase elevation is not more than 2 x the NL, she has ESRD, and the pancreas changes have been seen numerous times.  She may indeed have chronic pancreatitis but sometimes lipase elvation comes from the intestine or stomach and I think reasonable to do an EGd and also look at the papilla with duodenoscope to see if that is normal or not.  Hopefully her labs (K) will allow [place] to sedate and scope tomorrow at 0900 if not will need to go for Wed after HD tomorrow  An EUS may be needed but will not be possible this admission.   The risks and benefits as well as alternatives of endoscopic procedure(s) have been discussed and reviewed. All questions answered. The patient agrees to proceed.  Gatha Mayer, MD, [facility] 09/05/2019 5:06 PM

## 2019-09-05 NOTE — H&P (Addendum)
History and Physical    MINERVIA OSSO HDQ:[id number] DOB: 10/01/1950 DOA: 09/05/2019  PCP: Sonia Side., FNP   Patient coming from: Home  I have personally briefly reviewed patient's old medical records in [place]  Chief Complaint: Abd pain  HPI: Ashley [name] is a 69 y.o. female with medical history significant of recurrent pancreatitis, GERD, perforated gastric ulcer with surgical repair 1990, Hepatitis C (completed Epclusa Rx in Jan 2021), Hx of IVDA,  ESRD on HD, HTN who presented to the ED with epigastric pain for 2 days. The nature of pain was same as when she had her previous episodes and she had such episode about 4-5 times since last July. She also had multiple loose diarrhea, without any changes of abd pain, feeling nausea and has not been eating or drinking since last night. No fever or chills. ED Course: Lipase 85, CT abd showed no acute findings.  Review of Systems: As per HPI otherwise 10 point review of systems negative.    Past Medical History:  Diagnosis Date  . Acute pancreatitis 12/2018  . Arthritis   . Cervical radiculopathy 02/28/2011  . Cocaine substance abuse ([place]) 05/26/2013   positive UDS   . ESRD on hemodialysis (HCC)    TTS  . Gastropathy   . GERD (gastroesophageal reflux disease)   . Hepatitis C 1987   history of IVDA  . Hiatal hernia   . Hyperlipidemia   . Hypertension   . Marijuana abuse 12/18/204   positive UDS, family members smoke as well  . Pancreatitis 2000   resolved  . 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 Schatzki's ring   . Stroke ([place])   . Ulcer 1990   gastric ulcer. Ruptured s/p emergency repair    Past Surgical History:  Procedure Laterality Date  . ABDOMINAL HYSTERECTOMY  1979  . AV FISTULA PLACEMENT Left 06/16/2017   Procedure: ARTERIOVENOUS (AV) FISTULA CREATION LEFT ARM;  Surgeon: Conrad Rohrsburg, MD;  Location: [place];  Service: Vascular;  Laterality: Left;  . BASCILIC VEIN TRANSPOSITION Left 10/02/2017    Procedure: BASILIC VEIN TRANSPOSITION SECOND STAGE LEFT ARM;  Surgeon: Rosetta Posner, MD;  Location: [facility] OR;  Service: Vascular;  Laterality: Left;  . ESOPHAGOGASTRODUODENOSCOPY N/A 05/29/2013   Procedure: ESOPHAGOGASTRODUODENOSCOPY (EGD);  Surgeon: Jerene Bears, MD;  Location: [facility] ENDOSCOPY;  Service: Endoscopy;  Laterality: N/A;  . EXCHANGE OF A DIALYSIS CATHETER Left 07/31/2017   Procedure: Removal  OF A  Right GroinTUNNELED  DIALYSIS CATHETER ,  Insertion of Left Femoral Dialysis Catheter.;  Surgeon: Rosetta Posner, MD;  Location: [place];  Service: Vascular;  Laterality: Left;  . INSERTION OF DIALYSIS CATHETER Right 06/16/2017   Procedure: INSERTION OF DIALYSIS CATHETER;  Surgeon: Conrad Sheridan Lake, MD;  Location: [place];  Service: Vascular;  Laterality: Right;  . IR AV DIALY SHUNT INTRO [place] W/PTA/IMG LEFT  06/21/2018  . REPAIR OF PERFORATED ULCER       reports that she has been smoking cigarettes. She has a 10.00 pack-year smoking history. She has never used smokeless tobacco. She reports previous drug use. Drugs: Heroin, Marijuana, and Cocaine. She reports that she does not drink alcohol.  Allergies  Allergen Reactions  . Aspirin Nausea And Vomiting    Stomach ache  . Ibuprofen Nausea And Vomiting    Stomach ache    Family History  Problem Relation Age of Onset  . Hypertension Father   . Cancer Father   .  Hyperlipidemia Father   . Seizures Sister   . Early death Daughter   . Kidney disease Daughter        end stage dialysis dependent      Prior to Admission medications   Medication Sig Start Date End Date Taking? Authorizing Provider  amLODipine (NORVASC) 10 MG tablet Take 1 tablet (10 mg total) by mouth daily. 11/11/16   Katheren Shams, DO  B Complex-C-Zn-Folic Acid (DIALYVITE 751 WITH ZINC) 0.8 MG TABS Take 1 tablet by mouth daily. 07/07/19   [provider]  cloNIDine (CATAPRES) 0.1 MG tablet Take 0.1 mg by mouth 2 (two) times daily. 06/25/19   [provider]  metoprolol succinate (TOPROL-XL) 25 MG 24 hr tablet Take 25 mg by mouth daily.    [provider]  Nutritional Supplements (FEEDING SUPPLEMENT, NEPRO CARB STEADY,) LIQD Take 237 mLs by mouth 3 (three) times daily as needed (Supplement). 07/11/19   Black, Lezlie Octave, NP  Sofosbuvir-Velpatasvir (EPCLUSA) 400-100 MG TABS Take 1 tablet by mouth daily. Take 1 tablet by mouth daily. Patient not taking: Reported on 07/09/2019 04/12/19   [place], FNP    Physical Exam: Vitals:   09/05/19 1015 09/05/19 1030 09/05/19 1100 09/05/19 1115  BP: 123/70 123/66 (!) 151/81 (!) 145/77  Pulse: 65 64 68 (!) 45  Resp:    16  Temp:      TempSrc:      SpO2: 95% 96% 97% 99%  Weight:      Height:        Constitutional: NAD, calm, comfortable Vitals:   09/05/19 1015 09/05/19 1030 09/05/19 1100 09/05/19 1115  BP: 123/70 123/66 (!) 151/81 (!) 145/77  Pulse: 65 64 68 (!) 45  Resp:    16  Temp:      TempSrc:      SpO2: 95% 96% 97% 99%  Weight:      Height:       Eyes: PERRL, lids and conjunctivae normal ENMT: Mucous membranes are moist. Posterior pharynx clear of any exudate or lesions.Normal dentition.  Neck: normal, supple, no masses, no thyromegaly Respiratory: clear to auscultation bilaterally, no wheezing, no crackles. Normal respiratory effort. No accessory muscle use.  Cardiovascular: Regular rate and rhythm, no murmurs / rubs / gallops. No extremity edema. 2+ pedal pulses. No carotid bruits.  Abdomen: mild tenderness on deep palpation on epigastric area, no rebound or guarding, no masses palpated. No hepatosplenomegaly. Bowel sounds positive.  Musculoskeletal: no clubbing / cyanosis. No joint deformity upper and lower extremities. Good ROM, no contractures. Normal muscle tone.  Skin: no rashes, lesions, ulcers. No induration Neurologic: CN 2-12 grossly intact. Sensation intact, DTR normal. Strength 5/5 in all 4.  Psychiatric: Normal judgment and insight. Alert and  oriented x 3. Normal mood.     Labs on Admission: I have personally reviewed following labs and imaging studies  CBC: Recent Labs  Lab 09/05/19 0912  WBC 9.1  NEUTROABS 5.2  HGB 13.7  HCT 41.3  MCV 91.2  PLT 700   Basic Metabolic Panel: Recent Labs  Lab 09/05/19 0912  NA 137  K 5.3*  CL 88*  CO2 31  GLUCOSE 111*  BUN 49*  CREATININE 11.46*  CALCIUM 10.1   GFR: Estimated Creatinine Clearance: 3.6 mL/min (A) (by C-G formula based on SCr of 11.46 mg/dL (H)). Liver Function Tests: Recent Labs  Lab 09/05/19 0912  AST 16  ALT 13  ALKPHOS 59  BILITOT 0.8  PROT 8.6*  ALBUMIN  3.9   Recent Labs  Lab 09/05/19 0912  LIPASE 85*   No results for input(s): AMMONIA in the last 168 hours. Coagulation Profile: No results for input(s): INR, PROTIME in the last 168 hours. Cardiac Enzymes: No results for input(s): CKTOTAL, CKMB, CKMBINDEX, TROPONINI in the last 168 hours. BNP (last 3 results) No results for input(s): PROBNP in the last 8760 hours. HbA1C: No results for input(s): HGBA1C in the last 72 hours. CBG: No results for input(s): GLUCAP in the last 168 hours. Lipid Profile: No results for input(s): CHOL, HDL, LDLCALC, TRIG, CHOLHDL, LDLDIRECT in the last 72 hours. Thyroid Function Tests: No results for input(s): TSH, T4TOTAL, FREET4, T3FREE, THYROIDAB in the last 72 hours. Anemia Panel: No results for input(s): VITAMINB12, FOLATE, FERRITIN, TIBC, IRON, RETICCTPCT in the last 72 hours. Urine analysis:    Component Value Date/Time   COLORURINE YELLOW 12/31/2018 1014   APPEARANCEUR CLEAR 12/31/2018 1014   LABSPEC 1.008 12/31/2018 1014   PHURINE 9.0 (H) 12/31/2018 1014   GLUCOSEU NEGATIVE 12/31/2018 1014   HGBUR SMALL (A) 12/31/2018 1014   BILIRUBINUR NEGATIVE 12/31/2018 1014   BILIRUBINUR NEG 01/29/2016 1142   KETONESUR NEGATIVE 12/31/2018 1014   PROTEINUR >=300 (A) 12/31/2018 1014   UROBILINOGEN 0.2 01/29/2016 1142   UROBILINOGEN 0.2 03/21/2015 1649    NITRITE NEGATIVE 12/31/2018 1014   LEUKOCYTESUR NEGATIVE 12/31/2018 1014    Radiological Exams on Admission: DG Abd Acute W/Chest  Result Date: 09/05/2019 CLINICAL DATA:  Abdominal pain and vomiting. EXAM: DG ABDOMEN ACUTE W/ 1V CHEST COMPARISON:  Chest x-ray 03/03/2009 FINDINGS: The upright chest x-ray is unremarkable. No acute pulmonary findings, pleural effusion or pulmonary lesions. The heart is normal in size. Stable tortuosity and calcification of the thoracic aorta. A left axillary stent is noted. Two views of the abdomen demonstrate an unremarkable bowel gas pattern. No findings for obstruction or perforation. The soft tissue shadows are maintained. No worrisome calcifications. The bony structures are intact. IMPRESSION: 1. No acute cardiopulmonary findings. 2. No plain film findings for an acute abdominal process. Electronically Signed   By: Marijo Sanes M.D.   On: 09/05/2019 09:42    EKG: Independently reviewed. NSR, non-specific ST-T changes on V2-3  Assessment/Plan Active Problems:   Acute pancreatitis  Acute pancreatitis, recurrent Etiology unknown. Paged [facility] who saw pt in July 2020 admission, she also had a rather unremarkable MRI in Jan 2021 and today's CT did show any biliary or pancrease etiology. GI proposed EUS in July, might be the next move Pt failed GI challenge in ED, will plan for Floor Obs, with NPO, no maintaining hydration after discussion with Nephro, and pain control, expect discharge in 24-48 hours depending on symptoms improvement.  ESRD She follows TTS schedule D/W Dr. Royce Macadamia, not recommending continuous IVF, but only PRN bolus  HepC Completed treatement  HTN BP high, will resume home meds  Hx of drug abuse Check UDS  DVT prophylaxis: Heparin SubQ Code Status: Full Family Communication: None at bedside Disposition Plan: Likely go home in 1-2 days once symptoms improve Consults called: GI paged, Nephro Admission status: Floor obs   Lequita Halt MD [facility] Pager [phone]    09/05/2019, 12:42 PM

## 2019-09-05 NOTE — Consult Note (Signed)
[facility] Renal Consultation Note  Requesting MD: Wynetta Fines, MD Indication for Consultation:  ESRD  Chief complaint: abdominal pain   HPI:  Ashley [name] is a 69 y.o. female with a history of recurrent pancreatitis, end-stage renal disease on hemodialysis Tuesday Thursday Saturday, prior cocaine use, hepatitis C, GERD, and hypertension who presented to the hospital with epigastric pain.  She is being admitted with pancreatitis. She states has had a little dizziness with standing.  Hasn't been able to eat or drink much if any for the past couple of days due to abdominal pain.  States has had similar presentation before and is discouraged.  She states additional testing was planned but hasn't yet been completed.  Currently with nausea but no vomiting.  She denies any muscle cramping with HD.  Has been leaving at her EDW as below and last tx was Saturday, 3/27.    PMHx:   Past Medical History:  Diagnosis Date  . Acute pancreatitis 12/2018  . Arthritis   . Cervical radiculopathy 02/28/2011  . Cocaine substance abuse ([place]) 05/26/2013   positive UDS   . ESRD on hemodialysis (HCC)    TTS  . Gastropathy   . GERD (gastroesophageal reflux disease)   . Hepatitis C 1987   history of IVDA  . Hiatal hernia   . Hyperlipidemia   . Hypertension   . Marijuana abuse 12/18/204   positive UDS, family members smoke as well  . Pancreatitis 2000   resolved  . 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 Schatzki's ring   . Stroke ([place])   . Ulcer 1990   gastric ulcer. Ruptured s/p emergency repair    Past Surgical History:  Procedure Laterality Date  . ABDOMINAL HYSTERECTOMY  1979  . AV FISTULA PLACEMENT Left 06/16/2017   Procedure: ARTERIOVENOUS (AV) FISTULA CREATION LEFT ARM;  Surgeon: Conrad [place], MD;  Location: [place];  Service: Vascular;  Laterality: Left;  . BASCILIC VEIN TRANSPOSITION Left 10/02/2017   Procedure: BASILIC VEIN TRANSPOSITION SECOND STAGE LEFT ARM;  Surgeon:  Rosetta Posner, MD;  Location: [facility] OR;  Service: Vascular;  Laterality: Left;  . ESOPHAGOGASTRODUODENOSCOPY N/A 05/29/2013   Procedure: ESOPHAGOGASTRODUODENOSCOPY (EGD);  Surgeon: Jerene Bears, MD;  Location: [facility] ENDOSCOPY;  Service: Endoscopy;  Laterality: N/A;  . EXCHANGE OF A DIALYSIS CATHETER Left 07/31/2017   Procedure: Removal  OF A  Right GroinTUNNELED  DIALYSIS CATHETER ,  Insertion of Left Femoral Dialysis Catheter.;  Surgeon: Rosetta Posner, MD;  Location: [place];  Service: Vascular;  Laterality: Left;  . INSERTION OF DIALYSIS CATHETER Right 06/16/2017   Procedure: INSERTION OF DIALYSIS CATHETER;  Surgeon: Conrad Fairfield, MD;  Location: [place];  Service: Vascular;  Laterality: Right;  . IR AV DIALY SHUNT INTRO [place] W/PTA/IMG LEFT  06/21/2018  . REPAIR OF PERFORATED ULCER      Family Hx:  Family History  Problem Relation Age of Onset  . Hypertension Father   . Cancer Father   . Hyperlipidemia Father   . Seizures Sister   . Early death Daughter   . Kidney disease Daughter        end stage dialysis dependent     Social History:  reports that she has been smoking cigarettes. She has a 10.00 pack-year smoking history. She has never used smokeless tobacco. She reports previous drug use. Drugs: Heroin, Marijuana, and Cocaine. She reports that she does not drink alcohol.  Allergies:  Allergies  Allergen Reactions  .  Aspirin Nausea And Vomiting    Stomach ache  . Ibuprofen Nausea And Vomiting    Stomach ache    Medications: Prior to Admission medications   Medication Sig Start Date End Date Taking? Authorizing Provider  acetaminophen (TYLENOL) 500 MG tablet Take 1,000 mg by mouth every 6 (six) hours as needed for headache (pain).   Yes [provider]  amLODipine (NORVASC) 10 MG tablet Take 1 tablet (10 mg total) by mouth daily. Patient taking differently: Take 10 mg by mouth daily at 12 noon.  11/11/16  Yes Luiz Blare Y, DO  B Complex-C-Zn-Folic Acid  (DIALYVITE 800 WITH ZINC) 0.8 MG TABS Take 1 tablet by mouth daily with breakfast.  07/07/19  Yes [provider]  cinacalcet (SENSIPAR) 30 MG tablet Take 30 mg by mouth daily with breakfast.   Yes [provider]  cloNIDine (CATAPRES) 0.1 MG tablet Take 0.1 mg by mouth 2 (two) times daily. 06/25/19  Yes [provider]  ferric citrate (AURYXIA) 1 GM 210 MG(Fe) tablet Take 210 mg by mouth 3 (three) times daily with meals.   Yes [provider]  lidocaine-prilocaine (EMLA) cream Apply 1 application topically See admin instructions. Apply small amount to access site on Tuesday, Thursday, Saturday one hour before dialysis. Cover with occlusive dressing (saran wrap) 08/25/19  Yes [provider]  Nutritional Supplements (FEEDING SUPPLEMENT, NEPRO CARB STEADY,) LIQD Take 237 mLs by mouth 3 (three) times daily as needed (Supplement). Patient taking differently: Take 237 mLs by mouth 2 (two) times daily as needed (meal supplement).  07/11/19  Yes Black, Lezlie Octave, NP    I have reviewed the patient's current and reported prior to admission medications.  Labs:  BMP Latest Ref Rng & Units 09/05/2019 07/20/2019 07/19/2019  Glucose 70 - 99 mg/dL 111(H) 90 136(H)  BUN 8 - 23 mg/dL 49(H) 10 33(H)  Creatinine 0.44 - 1.00 mg/dL 11.46(H) 5.78(H) 11.34(H)  BUN/Creat Ratio 12 - 28 - - -  Sodium 135 - 145 mmol/L 137 137 133(L)  Potassium 3.5 - 5.1 mmol/L 5.3(H) 4.5 4.9  Chloride 98 - 111 mmol/L 88(L) 99 92(L)  CO2 22 - 32 mmol/L 31 27 22   Calcium 8.9 - 10.3 mg/dL 10.1 9.4 9.0    Urinalysis    Component Value Date/Time   COLORURINE YELLOW 12/31/2018 1014   APPEARANCEUR CLEAR 12/31/2018 1014   LABSPEC 1.008 12/31/2018 1014   PHURINE 9.0 (H) 12/31/2018 1014   GLUCOSEU NEGATIVE 12/31/2018 1014   HGBUR SMALL (A) 12/31/2018 1014   BILIRUBINUR NEGATIVE 12/31/2018 1014   BILIRUBINUR NEG 01/29/2016 1142   KETONESUR NEGATIVE 12/31/2018 1014   PROTEINUR >=300 (A) 12/31/2018 1014    UROBILINOGEN 0.2 01/29/2016 1142   UROBILINOGEN 0.2 03/21/2015 1649   NITRITE NEGATIVE 12/31/2018 1014   LEUKOCYTESUR NEGATIVE 12/31/2018 1014     ROS:  Pertinent items noted in HPI and remainder of comprehensive ROS otherwise negative.    Physical Exam: Vitals:   09/05/19 1115 09/05/19 1341  BP: (!) 145/77 (!) 147/96  Pulse: (!) 45 68  Resp: 16 17  Temp:  98.2 F (36.8 C)  SpO2: 99% 100%     General: adult female in bed in NAD at rest HEENT: NCAT Eyes: EOMI sclera anicteric Neck: supple trachea midline Heart: S1S2 no rub  Lungs: clear and unlabored  Abdomen: soft/epigastric tenderness; nondistended  Extremities: no edema appreciated no cyanosis or clubbing Skin: no rash on extremities exposed Neuro: alert and oriented x 3 provides hx and follows  commands  Psych normal mood and affect  Access: LUE AVF bruit and thrill   Outpatient HD orders:  [facility]  TTS schedule  3.5 hours 350 BF, 800 DF 2K/2.25 calcium EDW 59.5 kg and has been getting to EDW; last tx 3/27 and left 59.4 kg hectorol 3 mcg each tx Not on ESA - last Hb 11.4 on 3/18 and got mircera 50 mcg Jan.21, 2021  Assessment/Plan:  # ESRD  - Plan for HD per TTS schedule  - Given patient's ESRD, would avoid morphine for pain control. - noted pt rec'd lokelma x 1 dose today  # Acute Pancreatitis, recurrent - normal saline minibolus 250 mL once now  - ok to repeat overnight if needed; would defer continuous fluids given patient with ESRD - Given patient's ESRD, would avoid morphine for pain control. (If IV agent needed dilaudid or fentanyl preferred)  # Bradycardia  - resolved - unsure if accurate  - follow and titrate regimen if needed   # HTN  - follow on home regimen   # Anemia CKD  - no acute indication for ESA; last outpatient Hb 11.4 and 13.7 on admission  # Secondary hyperparathyroidism and metabolic bone disease  - Continue hectorol; on [place]; note on sensipar.   Appears off per outpt HD medication list but pt states she started it back two weeks ago.  Follow for now  Claudia Desanctis 09/05/2019, 3:34 PM

## 2019-09-05 NOTE — ED Triage Notes (Signed)
Pt BIB [facility] from home. Pt complaining of abdominal pain since Friday. Pt with history of pancreatitis. Denies N/V. VSS. NAD.
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
## 2019-09-06 ENCOUNTER — Encounter (HOSPITAL_COMMUNITY): Payer: Self-pay | Admitting: Internal Medicine

## 2019-09-06 ENCOUNTER — Encounter (HOSPITAL_COMMUNITY)
Admission: EM | Disposition: A | Discharge status: Home / Self Care | Payer: Self-pay | Source: Home / Self Care | Attending: Internal Medicine

## 2019-09-06 ENCOUNTER — Observation Stay (HOSPITAL_COMMUNITY): Payer: Medicare Other | Admitting: Anesthesiology

## 2019-09-06 DIAGNOSIS — Y92098 Other place in other non-institutional residence as the place of occurrence of the external cause: Secondary | ICD-10-CM | POA: Diagnosis not present

## 2019-09-06 DIAGNOSIS — W108XXA Fall (on) (from) other stairs and steps, initial encounter: Secondary | ICD-10-CM | POA: Diagnosis not present

## 2019-09-06 DIAGNOSIS — R1013 Epigastric pain: Secondary | ICD-10-CM | POA: Diagnosis not present

## 2019-09-06 DIAGNOSIS — M1990 Unspecified osteoarthritis, unspecified site: Secondary | ICD-10-CM | POA: Diagnosis not present

## 2019-09-06 DIAGNOSIS — K31819 Angiodysplasia of stomach and duodenum without bleeding: Secondary | ICD-10-CM

## 2019-09-06 DIAGNOSIS — F1210 Cannabis abuse, uncomplicated: Secondary | ICD-10-CM | POA: Diagnosis present

## 2019-09-06 DIAGNOSIS — Z8673 Personal history of transient ischemic attack (TIA), and cerebral infarction without residual deficits: Secondary | ICD-10-CM | POA: Diagnosis not present

## 2019-09-06 DIAGNOSIS — R109 Unspecified abdominal pain: Secondary | ICD-10-CM | POA: Diagnosis present

## 2019-09-06 DIAGNOSIS — I120 Hypertensive chronic kidney disease with stage 5 chronic kidney disease or end stage renal disease: Secondary | ICD-10-CM | POA: Diagnosis present

## 2019-09-06 DIAGNOSIS — K219 Gastro-esophageal reflux disease without esophagitis: Secondary | ICD-10-CM | POA: Diagnosis present

## 2019-09-06 DIAGNOSIS — K3189 Other diseases of stomach and duodenum: Secondary | ICD-10-CM

## 2019-09-06 DIAGNOSIS — N186 End stage renal disease: Secondary | ICD-10-CM | POA: Diagnosis present

## 2019-09-06 DIAGNOSIS — Z79899 Other long term (current) drug therapy: Secondary | ICD-10-CM | POA: Diagnosis not present

## 2019-09-06 DIAGNOSIS — Z8711 Personal history of peptic ulcer disease: Secondary | ICD-10-CM | POA: Diagnosis not present

## 2019-09-06 DIAGNOSIS — K859 Acute pancreatitis without necrosis or infection, unspecified: Secondary | ICD-10-CM | POA: Diagnosis present

## 2019-09-06 DIAGNOSIS — S6991XA Unspecified injury of right wrist, hand and finger(s), initial encounter: Secondary | ICD-10-CM | POA: Diagnosis present

## 2019-09-06 DIAGNOSIS — F1411 Cocaine abuse, in remission: Secondary | ICD-10-CM | POA: Diagnosis present

## 2019-09-06 DIAGNOSIS — M79641 Pain in right hand: Secondary | ICD-10-CM | POA: Diagnosis not present

## 2019-09-06 DIAGNOSIS — K2980 Duodenitis without bleeding: Secondary | ICD-10-CM

## 2019-09-06 DIAGNOSIS — E8889 Other specified metabolic disorders: Secondary | ICD-10-CM | POA: Diagnosis present

## 2019-09-06 DIAGNOSIS — N2581 Secondary hyperparathyroidism of renal origin: Secondary | ICD-10-CM | POA: Diagnosis present

## 2019-09-06 DIAGNOSIS — K8500 Idiopathic acute pancreatitis without necrosis or infection: Secondary | ICD-10-CM | POA: Diagnosis not present

## 2019-09-06 DIAGNOSIS — K2990 Gastroduodenitis, unspecified, without bleeding: Secondary | ICD-10-CM | POA: Diagnosis present

## 2019-09-06 DIAGNOSIS — D631 Anemia in chronic kidney disease: Secondary | ICD-10-CM | POA: Diagnosis present

## 2019-09-06 DIAGNOSIS — Z20822 Contact with and (suspected) exposure to covid-19: Secondary | ICD-10-CM | POA: Diagnosis present

## 2019-09-06 DIAGNOSIS — R791 Abnormal coagulation profile: Secondary | ICD-10-CM | POA: Diagnosis present

## 2019-09-06 DIAGNOSIS — K861 Other chronic pancreatitis: Secondary | ICD-10-CM | POA: Diagnosis present

## 2019-09-06 DIAGNOSIS — S52591A Other fractures of lower end of right radius, initial encounter for closed fracture: Secondary | ICD-10-CM | POA: Diagnosis not present

## 2019-09-06 DIAGNOSIS — K2970 Gastritis, unspecified, without bleeding: Secondary | ICD-10-CM

## 2019-09-06 DIAGNOSIS — Z992 Dependence on renal dialysis: Secondary | ICD-10-CM | POA: Diagnosis not present

## 2019-09-06 DIAGNOSIS — B1920 Unspecified viral hepatitis C without hepatic coma: Secondary | ICD-10-CM | POA: Diagnosis present

## 2019-09-06 DIAGNOSIS — Z83438 Family history of other disorder of lipoprotein metabolism and other lipidemia: Secondary | ICD-10-CM | POA: Diagnosis not present

## 2019-09-06 DIAGNOSIS — F17210 Nicotine dependence, cigarettes, uncomplicated: Secondary | ICD-10-CM | POA: Diagnosis present

## 2019-09-06 DIAGNOSIS — R001 Bradycardia, unspecified: Secondary | ICD-10-CM | POA: Diagnosis present

## 2019-09-06 DIAGNOSIS — G8929 Other chronic pain: Secondary | ICD-10-CM | POA: Diagnosis present

## 2019-09-06 DIAGNOSIS — Y9389 Activity, other specified: Secondary | ICD-10-CM | POA: Diagnosis not present

## 2019-09-06 DIAGNOSIS — Z8249 Family history of ischemic heart disease and other diseases of the circulatory system: Secondary | ICD-10-CM | POA: Diagnosis not present

## 2019-09-06 DIAGNOSIS — Y999 Unspecified external cause status: Secondary | ICD-10-CM | POA: Diagnosis not present

## 2019-09-06 HISTORY — PX: ESOPHAGOGASTRODUODENOSCOPY (EGD) WITH PROPOFOL: SHX5813

## 2019-09-06 HISTORY — PX: HOT HEMOSTASIS: SHX5433

## 2019-09-06 HISTORY — PX: BIOPSY: SHX5522

## 2019-09-06 LAB — CBC
HCT: 40.2 % (ref 36.0–46.0)
Hemoglobin: 13.2 g/dL (ref 12.0–15.0)
MCH: 29.7 pg (ref 26.0–34.0)
MCHC: 32.8 g/dL (ref 30.0–36.0)
MCV: 90.5 fL (ref 80.0–100.0)
Platelets: 240 10*3/uL (ref 150–400)
RBC: 4.44 MIL/uL (ref 3.87–5.11)
RDW: 17.4 % — ABNORMAL HIGH (ref 11.5–15.5)
WBC: 8.8 10*3/uL (ref 4.0–10.5)
nRBC: 0 % (ref 0.0–0.2)

## 2019-09-06 LAB — LIPASE, BLOOD: Lipase: 82 U/L — ABNORMAL HIGH (ref 11–51)

## 2019-09-06 LAB — COMPREHENSIVE METABOLIC PANEL
ALT: 7 U/L (ref 0–44)
AST: 17 U/L (ref 15–41)
Albumin: 3.5 g/dL (ref 3.5–5.0)
Alkaline Phosphatase: 58 U/L (ref 38–126)
Anion gap: 21 — ABNORMAL HIGH (ref 5–15)
BUN: 60 mg/dL — ABNORMAL HIGH (ref 8–23)
CO2: 25 mmol/L (ref 22–32)
Calcium: 9.3 mg/dL (ref 8.9–10.3)
Chloride: 91 mmol/L — ABNORMAL LOW (ref 98–111)
Creatinine, Ser: 13.83 mg/dL — ABNORMAL HIGH (ref 0.44–1.00)
GFR calc Af Amer: 3 mL/min — ABNORMAL LOW (ref >60)
GFR calc non Af Amer: 2 mL/min — ABNORMAL LOW (ref >60)
Glucose, Bld: 96 mg/dL (ref 70–99)
Potassium: 5.7 mmol/L — ABNORMAL HIGH (ref 3.5–5.1)
Sodium: 137 mmol/L (ref 135–145)
Total Bilirubin: 0.5 mg/dL (ref 0.3–1.2)
Total Protein: 7.6 g/dL (ref 6.5–8.1)

## 2019-09-06 LAB — MRSA PCR SCREENING: MRSA by PCR: NEGATIVE

## 2019-09-06 SURGERY — ESOPHAGOGASTRODUODENOSCOPY (EGD) WITH PROPOFOL
Anesthesia: Monitor Anesthesia Care

## 2019-09-06 MED ORDER — SODIUM CHLORIDE 0.9 % IV SOLN: INTRAVENOUS | Status: DC

## 2019-09-06 MED ORDER — OXYCODONE HCL 5 MG PO TABS
ORAL_TABLET | ORAL | Status: AC
  Administered 2019-09-06: 5 mg via ORAL
  Filled 2019-09-06: qty 1

## 2019-09-06 MED ORDER — LIDOCAINE-PRILOCAINE 2.5-2.5 % EX CREA
1.0000 "application " | TOPICAL_CREAM | CUTANEOUS | Status: DC | PRN
  Filled 2019-09-06: qty 5

## 2019-09-06 MED ORDER — ALTEPLASE 2 MG IJ SOLR: 2.0000 mg | Freq: Once | INTRAMUSCULAR | Status: DC | PRN

## 2019-09-06 MED ORDER — HEPARIN SODIUM (PORCINE) 1000 UNIT/ML DIALYSIS
1000.0000 [IU] | INTRAMUSCULAR | Status: DC | PRN
  Filled 2019-09-06: qty 1

## 2019-09-06 MED ORDER — SODIUM CHLORIDE 0.9 % IV SOLN: 100.0000 mL | INTRAVENOUS | Status: DC | PRN

## 2019-09-06 MED ORDER — PROPOFOL 500 MG/50ML IV EMUL
INTRAVENOUS | Status: DC | PRN
  Administered 2019-09-06: 75 ug/kg/min via INTRAVENOUS

## 2019-09-06 MED ORDER — LIDOCAINE 2% (20 MG/ML) 5 ML SYRINGE
INTRAMUSCULAR | Status: DC | PRN
  Administered 2019-09-06: 60 mg via INTRAVENOUS

## 2019-09-06 MED ORDER — HYDROMORPHONE HCL 1 MG/ML IJ SOLN
0.5000 mg | INTRAMUSCULAR | Status: DC | PRN
  Administered 2019-09-06 – 2019-09-10 (×13): 0.5 mg via INTRAVENOUS
  Filled 2019-09-06 (×15): qty 1

## 2019-09-06 MED ORDER — PROPOFOL 10 MG/ML IV BOLUS
INTRAVENOUS | Status: DC | PRN
  Administered 2019-09-06: 20 mg via INTRAVENOUS

## 2019-09-06 MED ORDER — PANTOPRAZOLE SODIUM 40 MG PO TBEC
40.0000 mg | DELAYED_RELEASE_TABLET | Freq: Every day | ORAL | Status: DC
  Administered 2019-09-06 – 2019-09-11 (×6): 40 mg via ORAL
  Filled 2019-09-06 (×6): qty 1

## 2019-09-06 MED ORDER — BOOST / RESOURCE BREEZE PO LIQD CUSTOM: 1.0000 | Freq: Two times a day (BID) | ORAL | Status: DC

## 2019-09-06 MED ORDER — PRO-STAT SUGAR FREE PO LIQD
30.0000 mL | Freq: Two times a day (BID) | ORAL | Status: DC
  Administered 2019-09-06 – 2019-09-11 (×9): 30 mL via ORAL
  Filled 2019-09-06 (×9): qty 30

## 2019-09-06 MED ORDER — LIDOCAINE HCL (PF) 1 % IJ SOLN: 5.0000 mL | INTRAMUSCULAR | Status: DC | PRN

## 2019-09-06 MED ORDER — SODIUM CHLORIDE 0.9 % IV SOLN: INTRAVENOUS | Status: DC | PRN

## 2019-09-06 MED ORDER — PENTAFLUOROPROP-TETRAFLUOROETH EX AERO: 1.0000 "application " | INHALATION_SPRAY | CUTANEOUS | Status: DC | PRN

## 2019-09-06 SURGICAL SUPPLY — 15 items

## 2019-09-06 NOTE — Interval H&P Note (Signed)
History and Physical Interval Note:  09/06/2019 8:36 AM  Ashley [name]  has presented today for surgery, with the diagnosis of Epigastric pain.  The various methods of treatment have been discussed with the patient and family. After consideration of risks, benefits and other options for treatment, the patient has consented to  Procedure(s): ESOPHAGOGASTRODUODENOSCOPY (EGD) WITH PROPOFOL (N/A) as a surgical intervention.  The patient's history has been reviewed, patient examined, no change in status, stable for surgery.  I have reviewed the patient's chart and labs.  Questions were answered to the patient's satisfaction.     Silvano Rusk

## 2019-09-06 NOTE — Anesthesia Procedure Notes (Signed)
Procedure Name: MAC Date/Time: 09/06/2019 8:40 AM Performed by: Inda Coke, CRNA Pre-anesthesia Checklist: Patient identified, Emergency Drugs available, Suction available, Timeout performed and Patient being monitored Patient Re-evaluated:Patient Re-evaluated prior to induction Oxygen Delivery Method: Nasal cannula Induction Type: IV induction Dental Injury: Teeth and Oropharynx as per pre-operative assessment

## 2019-09-06 NOTE — Anesthesia Preprocedure Evaluation (Addendum)
Anesthesia Evaluation  Patient identified by MRN, date of birth, ID band Patient awake    Reviewed: Allergy & Precautions, NPO status , Patient's Chart, lab work & pertinent test results  History of Anesthesia Complications Negative for: history of anesthetic complications  Airway Mallampati: II  TM Distance: >3 FB Neck ROM: Full    Dental no notable dental hx. (+) Dental Advisory Given, Missing, Poor Dentition,    Pulmonary Current Smoker, former smoker,    Pulmonary exam normal        Cardiovascular hypertension, Pt. on medications Normal cardiovascular exam  Left ventricle:  The cavity size was normal. Wall thickness was increased increased in a pattern of mild to moderate LVH. Systolic function was normal. The estimated ejection fraction was in the range of 60% to 65%. Wall motion was normal;   Neuro/Psych  Neuromuscular disease CVA, Residual Symptoms negative psych ROS   GI/Hepatic hiatal hernia, PUD, (+) Hepatitis -, C  Endo/Other    Renal/GU ESRFRenal disease     Musculoskeletal  (+) Arthritis , Osteoarthritis,    Abdominal   Peds  Hematology   Anesthesia Other Findings   Reproductive/Obstetrics                            Lab Results  Component Value Date   WBC 9.1 09/05/2019   HGB 13.7 09/05/2019   HCT 41.3 09/05/2019   MCV 91.2 09/05/2019   PLT 259 09/05/2019   Lab Results  Component Value Date   CREATININE 11.46 (H) 09/05/2019   BUN 49 (H) 09/05/2019   NA 137 09/05/2019   K 5.3 (H) 09/05/2019   CL 88 (L) 09/05/2019   CO2 31 09/05/2019    Anesthesia Physical  Anesthesia Plan  ASA: III  Anesthesia Plan: MAC   Post-op Pain Management:    Induction: Intravenous  PONV Risk Score and Plan: 2 and Ondansetron, Treatment may vary due to age or medical condition and Propofol infusion  Airway Management Planned: Natural Airway and Simple Face Mask  Additional  Equipment:   Intra-op Plan:   Post-operative Plan:   Informed Consent: I have reviewed the patients History and Physical, chart, labs and discussed the procedure including the risks, benefits and alternatives for the proposed anesthesia with the patient or authorized representative who has indicated his/her understanding and acceptance.     Dental advisory given  Plan Discussed with: CRNA and Anesthesiologist  Anesthesia Plan Comments:         Anesthesia Quick Evaluation

## 2019-09-06 NOTE — Progress Notes (Addendum)
[facility] Progress Note  Name: OREE MIRELEZ MRN: [id number] DOB: 24-Jul-1950  Chief Complaint:  abd pain, n/v  Subjective:  Seen and examined on HD.  Procedure supervised.  137/78 and HR 59. 12:46pm.  Left AVF in use.  S/p EGD with erosive gastropathy and no stigmata of recent bleeding.  Single nonbleeding angiodysplastic lesion s/p APC.  States she kept down broth after her procedure.  Per GI will likely need an endoscopic look at pancreatis to see if she has chronic pancreatitis but may not change management.  Review of systems:  Nausea is better - has just started clears Denies shortness of breath or CP ------------ Background on consult:  SUNITA DEMOND is a 69 y.o. female with a history of recurrent pancreatitis, end-stage renal disease on hemodialysis Tuesday Thursday Saturday, prior cocaine use, hepatitis C, GERD, and hypertension who presented to the hospital with epigastric pain.  She is being admitted with pancreatitis. She states has had a little dizziness with standing.  Hasn't been able to eat or drink much if any for the past couple of days due to abdominal pain.  States has had similar presentation before and is discouraged.  She states additional testing was planned but hasn't yet been completed.  Currently with nausea but no vomiting.  She denies any muscle cramping with HD.  Has been leaving at her EDW as below and last tx was Saturday, 3/27.     Intake/Output Summary (Last 24 hours) at 09/06/2019 1237 Last data filed at 09/06/2019 6629 Gross per 24 hour  Intake 540.62 ml  Output 0 ml  Net 540.62 ml    Vitals:  Vitals:   09/06/19 1003 09/06/19 1211 09/06/19 1221 09/06/19 1230  BP: (!) 151/65 (P) 139/75 (!) (P) 148/77 (P) 137/78  Pulse: 63 (P) 61 (!) (P) 54 (!) (P) 59  Resp: 12 (P) 14  (P) 13  Temp: 98 F (36.7 C) (P) 98.3 F (36.8 C)    TempSrc: Oral (P) Oral    SpO2: 96% (P) 96%    Weight:      Height:         Physical Exam:  General adult  female in bed in no acute distress HEENT normocephalic atraumatic extraocular movements intact sclera anicteric Neck supple trachea midline Lungs clear to auscultation bilaterally normal work of breathing at rest  Heart S1S2 no rub Abdomen soft minimally tender nondistended Extremities no edema  Psych normal mood and affect Neuro - alert and oriented x3 provides hx and follows commands Access LUE AVF in use   Medications reviewed  Labs:  BMP Latest Ref Rng & Units 09/06/2019 09/05/2019 07/20/2019  Glucose 70 - 99 mg/dL 96 111(H) 90  BUN 8 - 23 mg/dL 60(H) 49(H) 10  Creatinine 0.44 - 1.00 mg/dL 13.83(H) 11.46(H) 5.78(H)  BUN/Creat Ratio 12 - 28 - - -  Sodium 135 - 145 mmol/L 137 137 137  Potassium 3.5 - 5.1 mmol/L 5.7(H) 5.3(H) 4.5  Chloride 98 - 111 mmol/L 91(L) 88(L) 99  CO2 22 - 32 mmol/L 25 31 27   Calcium 8.9 - 10.3 mg/dL 9.3 10.1 9.4     Assessment/Plan:   # ESRD  - HD per TTS schedule  - Given patient's ESRD, would avoid morphine for pain control. - 2K bath and had lokelma on 3/29. - note have stopped sensipar given her nausea for now  # Acute Pancreatitis, recurrent - Given patient's ESRD, would avoid morphine for pain control. (If IV agent needed dilaudid  or fentanyl preferred).  Keeping even with HD - she has just attempted clears - s/p EGD with GI   # Bradycardia  - improved; follow and titrate regimen if needed   # HTN  - follow on home regimen   # Anemia CKD  - no acute indication for ESA; last outpatient Hb 11.4 and 13.7 on admission  # Secondary hyperparathyroidism and metabolic bone disease  - Continue hectorol; on [place]; note on sensipar.  Appears off per outpt HD medication list but pt states she started it back two weeks ago.  stop her sensipar given her nausea and follow for now   Claudia Desanctis, MD 09/06/2019 12:55 PM  Nausea/PO intolerance  - Nausea improving and had clears earlier today after procedure.  Supportive care per primary team    Claudia Desanctis 09/06/2019 1:56 PM

## 2019-09-06 NOTE — Anesthesia Postprocedure Evaluation (Signed)
Anesthesia Post Note  Patient: Ashley [name]  Procedure(s) Performed: ESOPHAGOGASTRODUODENOSCOPY (EGD) WITH PROPOFOL (N/A ) BIOPSY HOT HEMOSTASIS (ARGON PLASMA COAGULATION/BICAP) (N/A )     Patient location during evaluation: PACU Anesthesia Type: MAC Level of consciousness: awake and alert Pain management: pain level controlled Vital Signs Assessment: post-procedure vital signs reviewed and stable Respiratory status: spontaneous breathing, nonlabored ventilation, respiratory function stable and patient connected to nasal cannula oxygen Cardiovascular status: stable and blood pressure returned to baseline Postop Assessment: no apparent nausea or vomiting Anesthetic complications: no    Last Vitals:  Vitals:   09/06/19 0905 09/06/19 0915  BP: (!) 93/39 (!) 115/50  Pulse: (!) 52 (!) 56  Resp: 11 14  Temp: 37 C   SpO2: 94% 92%    Last Pain:  Vitals:   09/06/19 0905  TempSrc: Oral  PainSc: 0-No pain                 Vashon Arch

## 2019-09-06 NOTE — Transfer of Care (Signed)
Immediate Anesthesia Transfer of Care Note  Patient: Ashley [name]  Procedure(s) Performed: ESOPHAGOGASTRODUODENOSCOPY (EGD) WITH PROPOFOL (N/A ) BIOPSY HOT HEMOSTASIS (ARGON PLASMA COAGULATION/BICAP) (N/A )  Patient Location: PACU and Endoscopy Unit  Anesthesia Type:MAC  Level of Consciousness: awake and alert   Airway & Oxygen Therapy: Patient Spontanous Breathing and Patient connected to nasal cannula oxygen  Post-op Assessment: Report given to RN and Post -op Vital signs reviewed and stable  Post vital signs: Reviewed and stable  Last Vitals:  Vitals Value Taken Time  BP 93/39 09/06/19 0905  Temp    Pulse 64 09/06/19 0907  Resp 27 09/06/19 0907  SpO2 91 % 09/06/19 0907  Vitals shown include unvalidated device data.  Last Pain:  Vitals:   09/06/19 0905  TempSrc:   PainSc: 0-No pain      Patients Stated Pain Goal: 2 (78/67/54 4920)  Complications: No apparent anesthesia complications

## 2019-09-06 NOTE — Progress Notes (Signed)
Progress Note    Ashley [name]  ZOX:[id number] DOB: [birth year]  DOA: 09/05/2019 PCP: Sonia Side., FNP    Brief Narrative:    Medical records reviewed and are as summarized below:  Ashley [name] is an 69 y.o. female with medical history significant of recurrent pancreatitis,GERD, perforated gastric ulcerwith surgical [phone], Hepatitis C(completed Epclusa Rx in Jan 2021),Hx of IVDA, ESRD on HD, HTN who presented to the ED with epigastric pain for 2 days. The nature of pain was same as when she had her previous episodes and she had such episode about 4-5 times since last July. She also had multiple loose diarrhea, without any changes of abd pain, feeling nausea and has not been eating or drinking since last night. No fever or chills.   Assessment/Plan:   Active Problems:   Acute pancreatitis   Abnormal serum level of lipase   Abdominal pain, chronic, epigastric   Angiodysplasia of duodenum   Gastritis and gastroduodenitis   Continuous severe abdominal pain   Abdominal pain: Thought to be recurrent pancreatitis but patient underwent an EGD on 3/30.  EGD showed erosive gastropathy and duodenitis.  Patient had a single nonbleeding lesion in the duodenum that was treated with plasma coagulation.  EGD has not ruled out pancreatitis and patient will most likely need outpatient follow-up with GI and an endoscopic ultrasound. Patient still having abdominal pain requiring IV narcotics intermixed with PO pain meds-- wean as able -Patient has been started on a clear diet -Patient was been given Protonix daily -Unable to give patient IV fluids due to dialysis status  ESRD -Getting dialysis today  HepC Completed treatement  HTN -Resume home meds  Hx of drug abuse -Patient states she no longer uses cocaine    Family Communication/Anticipated D/C date and plan/Code Status   DVT prophylaxis: Heparin Code Status: Full Code.  Family Communication:  Disposition  Plan: Needs to remain in the hospital for pain control as well as continued work-up of recurrent abdominal pain   Medical Consultants:    Renal  GI     Subjective:   Says she tolerated clears okay out increase in pain  Objective:    Vitals:   09/06/19 1221 09/06/19 1230 09/06/19 1330 09/06/19 1400  BP: (!) 148/77 137/78 (!) 150/87 136/85  Pulse: (!) 54 (!) 59 71 74  Resp:  13    Temp:      TempSrc:      SpO2:      Weight:      Height:        Intake/Output Summary (Last 24 hours) at 09/06/2019 1431 Last data filed at 09/06/2019 0907 Gross per 24 hour  Intake 540.62 ml  Output 0 ml  Net 540.62 ml   Filed Weights   09/05/19 0909 09/06/19 1211  Weight: 59.9 kg 60.1 kg    Exam: In bed, no acute distress-getting hemodialysis Regular rate and rhythm No increased work of breathing, no wheezing Normal mood and affect Positive bowel sounds, soft tender in the epigastric region   Data Reviewed:   I have personally reviewed following labs and imaging studies:  Labs: Labs show the following:   Basic Metabolic Panel: Recent Labs  Lab 09/05/19 0912 09/06/19 1047  NA 137 137  K 5.3* 5.7*  CL 88* 91*  CO2 31 25  GLUCOSE 111* 96  BUN 49* 60*  CREATININE 11.46* 13.83*  CALCIUM 10.1 9.3   GFR Estimated Creatinine Clearance: 3 mL/min (A) (by C-G  formula based on SCr of 13.83 mg/dL (H)). Liver Function Tests: Recent Labs  Lab 09/05/19 0912 09/06/19 1047  AST 16 17  ALT 13 7  ALKPHOS 59 58  BILITOT 0.8 0.5  PROT 8.6* 7.6  ALBUMIN 3.9 3.5   Recent Labs  Lab 09/05/19 0912 09/06/19 1047  LIPASE 85* 82*   No results for input(s): AMMONIA in the last 168 hours. Coagulation profile No results for input(s): INR, PROTIME in the last 168 hours.  CBC: Recent Labs  Lab 09/05/19 0912 09/06/19 1047  WBC 9.1 8.8  NEUTROABS 5.2  --   HGB 13.7 13.2  HCT 41.3 40.2  MCV 91.2 90.5  PLT 259 240   Cardiac Enzymes: No results for input(s): CKTOTAL, CKMB,  CKMBINDEX, TROPONINI in the last 168 hours. BNP (last 3 results) No results for input(s): PROBNP in the last 8760 hours. CBG: No results for input(s): GLUCAP in the last 168 hours. D-Dimer: No results for input(s): DDIMER in the last 72 hours. Hgb A1c: No results for input(s): HGBA1C in the last 72 hours. Lipid Profile: No results for input(s): CHOL, HDL, LDLCALC, TRIG, CHOLHDL, LDLDIRECT in the last 72 hours. Thyroid function studies: No results for input(s): TSH, T4TOTAL, T3FREE, THYROIDAB in the last 72 hours.  Invalid input(s): FREET3 Anemia work up: No results for input(s): VITAMINB12, FOLATE, FERRITIN, TIBC, IRON, RETICCTPCT in the last 72 hours. Sepsis Labs: Recent Labs  Lab 09/05/19 0912 09/06/19 1047  WBC 9.1 8.8    Microbiology Recent Results (from the past 240 hour(s))  SARS CORONAVIRUS 2 (TAT 6-24 HRS) Nasopharyngeal Nasopharyngeal Swab     Status: None   Collection Time: 09/05/19  1:22 PM   Specimen: Nasopharyngeal Swab  Result Value Ref Range Status   SARS Coronavirus 2 NEGATIVE NEGATIVE Final    Comment: (NOTE) SARS-CoV-2 target nucleic acids are NOT DETECTED. The SARS-CoV-2 RNA is generally detectable in upper and lower respiratory specimens during the acute phase of infection. Negative results do not preclude SARS-CoV-2 infection, do not rule out co-infections with other pathogens, and should not be used as the sole basis for treatment or other patient management decisions. Negative results must be combined with clinical observations, patient history, and epidemiological information. The expected result is Negative. Fact Sheet for Patients: SugarRoll.be Fact Sheet for Healthcare Providers: https://www.woods-mathews.com/ This test is not yet approved or cleared by the [place] FDA and  has been authorized for detection and/or diagnosis of SARS-CoV-2 by FDA under an Emergency Use Authorization (EUA). This EUA  will remain  in effect (meaning this test can be used) for the duration of the COVID-19 declaration under Section 56 4(b)(1) of the Act, 21 U.S.C. section 360bbb-3(b)(1), unless the authorization is terminated or revoked sooner. Performed at [facility], [place] [address], [place], [place] [address]   MRSA PCR Screening     Status: None   Collection Time: 09/06/19  8:04 AM   Specimen: Nasal Mucosa; Nasopharyngeal  Result Value Ref Range Status   MRSA by PCR NEGATIVE NEGATIVE Final    Comment:        The GeneXpert MRSA Assay (FDA approved for NASAL specimens only), is one component of a comprehensive MRSA colonization surveillance program. It is not intended to diagnose MRSA infection nor to guide or monitor treatment for MRSA infections. Performed at [facility], [place] [address], [place], [place] [address]     Procedures and diagnostic studies:  DG Abd Acute W/Chest  Result Date: 09/05/2019 CLINICAL  DATA:  Abdominal pain and vomiting. EXAM: DG ABDOMEN ACUTE W/ 1V CHEST COMPARISON:  Chest x-ray 03/03/2009 FINDINGS: The upright chest x-ray is unremarkable. No acute pulmonary findings, pleural effusion or pulmonary lesions. The heart is normal in size. Stable tortuosity and calcification of the thoracic aorta. A left axillary stent is noted. Two views of the abdomen demonstrate an unremarkable bowel gas pattern. No findings for obstruction or perforation. The soft tissue shadows are maintained. No worrisome calcifications. The bony structures are intact. IMPRESSION: 1. No acute cardiopulmonary findings. 2. No plain film findings for an acute abdominal process. Electronically Signed   By: Marijo Sanes M.D.   On: 09/05/2019 09:42    Medications:   . amLODipine  10 mg Oral Daily  . Chlorhexidine Gluconate Cloth  6 each Topical Q0600  . cloNIDine  0.1 mg Oral BID  . doxercalciferol  3 mcg Oral Q T,Th,Sa-HD  . ferric citrate  210 mg Oral TID WC  . heparin  5,000 Units  Subcutaneous Q12H  . lidocaine-prilocaine  1 application Topical Q T,Th,Sa-HD  . metoprolol succinate  25 mg Oral Daily  . multivitamin  1 tablet Oral QHS  . pantoprazole  40 mg Oral QAC breakfast   Continuous Infusions: . sodium chloride    . sodium chloride       LOS: 0 days   Geradine Girt  [facility]   How to contact the [facility] Attending or Consulting provider [place] or covering provider during after hours [place], for this patient?  1. Check the care team in [facility] and look for a) attending/consulting [facility] provider listed and b) the [facility] team listed 2. Log into [email or web address] and use [place]'s universal password to access. If you do not have the password, please contact the hospital operator. 3. Locate the [facility] provider you are looking for under [facility] and page to a number that you can be directly reached. 4. If you still have difficulty reaching the provider, please page the [facility] (Director on Call) for the Hospitalists listed on amion for assistance.  09/06/2019, 2:31 PM

## 2019-09-06 NOTE — Op Note (Signed)
[facility] Patient Name: Ashley [name] Procedure Date : 09/06/2019 MRN: [id number] Attending MD: Gatha Mayer , MD Date of Birth: 1951/02/11 CSN: [id number] Age: 69 Admit Type: Inpatient Procedure:                Upper GI endoscopy Indications:              Epigastric abdominal pain, abnormal lipase,                            peripancreatic inflammation on CT/MR Providers:                Gatha Mayer, MD, Glori Bickers, RN, Theodora Blow,                            Technician Referring MD:              Medicines:                Propofol per Anesthesia, Monitored Anesthesia Care Complications:            No immediate complications. Estimated Blood Loss:     Estimated blood loss was minimal. Procedure:                Pre-Anesthesia Assessment:                           - Prior to the procedure, a History and Physical                            was performed, and patient medications and                            allergies were reviewed. The patient's tolerance of                            previous anesthesia was also reviewed. The risks                            and benefits of the procedure and the sedation                            options and risks were discussed with the patient.                            All questions were answered, and informed consent                            was obtained. Prior Anticoagulants: The patient has                            taken no previous anticoagulant or antiplatelet                            agents. ASA Grade Assessment: III - A patient with  severe systemic disease. After reviewing the risks                            and benefits, the patient was deemed in                            satisfactory condition to undergo the procedure.                           After obtaining informed consent, the endoscope was                            passed under direct vision. Throughout the           procedure, the patient's blood pressure, pulse, and                            oxygen saturations were monitored continuously. The                            GIF-H190 ([id number] Olympus gastroscope was                            introduced through the mouth, and advanced to the                            second part of duodenum. The upper GI endoscopy was                            accomplished without difficulty. The patient                            tolerated the procedure well. Scope In: Scope Out: Findings:      Multiple dispersed diminutive erosions with no stigmata of recent       bleeding were found in the gastric antrum. Biopsies were taken with a       cold forceps for histology. Verification of patient identification for       the specimen was done. Estimated blood loss was minimal.      Diffuse moderate inflammation characterized by congestion (edema),       erosions and erythema was found in the duodenal bulb. Biopsies were       taken with a cold forceps for histology. Verification of patient       identification for the specimen was done. Estimated blood loss was       minimal.      A single 2 mm angiodysplastic lesion without bleeding was found in the       duodenal bulb. Coagulation for bleeding prevention using argon plasma       was successful. Estimated blood loss was minimal.      The exam was otherwise without abnormality.      The cardia and gastric fundus were normal on retroflexion. Impression:               - Erosive gastropathy with no stigmata of recent  bleeding. Biopsied.                           - Duodenitis. Biopsied.                           - A single non-bleeding angiodysplastic lesion in                            the duodenum. Treated with argon plasma coagulation                            (APC). Thought reasonable to ablate as at higher                            risk of bleeding given co-morbidities                            - The examination was otherwise normal.                           - STILL CANNOT SAY SHE DOES NOT HAVE PANCREATITIS -                            BUT THIS IS NEW INFO AND WILL TREAT AS BELOW -                            LOOKS LIKE SHE HAS BEEN ON PPI IN PAST BUT NOT NOW Recommendation:           - Clear liquid diet.                           - Return patient to hospital ward for ongoing care.                           - Use Protonix (pantoprazole) 40 mg PO daily.                           - Await pathology results. She will need f/u GI                            clinic and likely need an endoscopic ultrasound to                            look at the pancreas to see if she has changes of                            chronic pancreatitis - unfourtunately that may not                            change management Procedure Code(s):        --- Professional ---  43255, 59, Esophagogastroduodenoscopy, flexible,                            transoral; with control of bleeding, any method                           43239, Esophagogastroduodenoscopy, flexible,                            transoral; with biopsy, single or multiple Diagnosis Code(s):        --- Professional ---                           K31.89, Other diseases of stomach and duodenum                           K29.80, Duodenitis without bleeding                           K31.819, Angiodysplasia of stomach and duodenum                            without bleeding                           R10.13, Epigastric pain CPT copyright 2019 American Medical Association. All rights reserved. The codes documented in this report are preliminary and upon coder review may  be revised to meet current compliance requirements. Gatha Mayer, MD 09/06/2019 9:20:55 AM This report has been signed electronically. Number of Addenda: 0

## 2019-09-06 NOTE — Progress Notes (Signed)
Initial Nutrition Assessment  DOCUMENTATION CODES:   Not applicable  INTERVENTION:   -Continue renal MVI daily -30 ml Prostat BID, each supplement provides 100 kcal and 15 grams protein -Boost Breeze po BID, each supplement provides 250 kcal and 9 grams of protein -RD will follow for diet advancement and adjust supplement regimen as appropriate  NUTRITION DIAGNOSIS:   Increased nutrient needs related to chronic illness(ESRD on HD) as evidenced by estimated needs.  GOAL:   Patient will meet greater than or equal to 90% of their needs  MONITOR:   PO intake, Supplement acceptance, Diet advancement, Labs, Weight trends, Skin, I & O's  REASON FOR ASSESSMENT:   Malnutrition Screening Tool    ASSESSMENT:   Ashley [name] is a 69 y.o. female with medical history significant of recurrent pancreatitis, GERD, perforated gastric ulcer with surgical repair 1990, Hepatitis C (completed Epclusa Rx in Jan 2021), Hx of IVDA,  ESRD on HD, HTN who presented to the ED with epigastric pain for 2 days. The nature of pain was same as when she had her previous episodes and she had such episode about 4-5 times since last July. She also had multiple loose diarrhea, without any changes of abd pain, feeling nausea and has not been eating or drinking since last night. No fever or chills.  Pt admitted with acute pancreatitis.   3/30- s/p EGD- revealed erosive gastropathy with no stigmata of recent bleeding (biopsied); duodenitis (biopsied); single non-bleeding angiodysplastic lesion in the duodenum (reated with argon plasma coagulation)                          Reviewed I/O's: +441 ml x 24 hours  UOP: 0 ml x 24 hours  Attempted to speak with pt x 2, however, either in HD or endo suite at times of visits. No family in room to provide further history  Reviewed wt hx; per nephrology notes, dry weight of 59.5 kg. Per wt hx; pt has experienced a 7.8% wt loss over the past 6 months, which is not significant  for time frame.   Medications reviewed and incluide 0.9% sodium chloride infusion @ 20 ml/hr.   Labs reviewed: K: 5.3.   Diet Order:   Diet Order            Diet clear liquid Room service appropriate? Yes; Fluid consistency: Thin  Diet effective now              EDUCATION NEEDS:   No education needs have been identified at this time  Skin:  Skin Assessment: Reviewed RN Assessment  Last BM:  09/03/19  Height:   Ht Readings from Last 1 Encounters:  09/05/19 5\' 2"  (1.575 m)    Weight:   Wt Readings from Last 1 Encounters:  09/06/19 60.1 kg    Ideal Body Weight:  50 kg  BMI:  Body mass index is 24.23 kg/m.  Estimated Nutritional Needs:   Kcal:  0071-2197  Protein:  85-100 grams  Fluid:  1000 ml + UOP    Loistine Chance, RD, LDN, [place] Registered Dietitian II Certified Diabetes Care and Education Specialist Please refer to [facility] for RD and/or RD on-call/weekend/after hours pager

## 2019-09-06 NOTE — Plan of Care (Signed)
  Problem: Education: Goal: Knowledge of General Education information will improve Description: Including pain rating scale, medication(s)/side effects and non-pharmacologic comfort measures Outcome: Progressing   Problem: Health Behavior/Discharge Planning: Goal: Ability to manage health-related needs will improve Outcome: Progressing   Problem: Clinical Measurements: Goal: Will remain free from infection Outcome: Progressing   Problem: Activity: Goal: Risk for activity intolerance will decrease Outcome: Progressing   Problem: Coping: Goal: Level of anxiety will decrease Outcome: Progressing   Problem: Elimination: Goal: Will not experience complications related to bowel motility Outcome: Progressing   Problem: Pain Managment: Goal: General experience of comfort will improve Outcome: Progressing   Problem: Safety: Goal: Ability to remain free from injury will improve Outcome: Progressing   Problem: Skin Integrity: Goal: Risk for impaired skin integrity will decrease Outcome: Progressing

## 2019-09-07 DIAGNOSIS — K31819 Angiodysplasia of stomach and duodenum without bleeding: Secondary | ICD-10-CM

## 2019-09-07 DIAGNOSIS — K8500 Idiopathic acute pancreatitis without necrosis or infection: Secondary | ICD-10-CM

## 2019-09-07 DIAGNOSIS — F17200 Nicotine dependence, unspecified, uncomplicated: Secondary | ICD-10-CM

## 2019-09-07 DIAGNOSIS — K2970 Gastritis, unspecified, without bleeding: Secondary | ICD-10-CM

## 2019-09-07 DIAGNOSIS — K2990 Gastroduodenitis, unspecified, without bleeding: Secondary | ICD-10-CM

## 2019-09-07 LAB — RENAL FUNCTION PANEL
Albumin: 3.1 g/dL — ABNORMAL LOW (ref 3.5–5.0)
Anion gap: 15 (ref 5–15)
BUN: 21 mg/dL (ref 8–23)
CO2: 25 mmol/L (ref 22–32)
Calcium: 9.3 mg/dL (ref 8.9–10.3)
Chloride: 96 mmol/L — ABNORMAL LOW (ref 98–111)
Creatinine, Ser: 7.13 mg/dL — ABNORMAL HIGH (ref 0.44–1.00)
GFR calc Af Amer: 6 mL/min — ABNORMAL LOW (ref >60)
GFR calc non Af Amer: 5 mL/min — ABNORMAL LOW (ref >60)
Glucose, Bld: 125 mg/dL — ABNORMAL HIGH (ref 70–99)
Phosphorus: 5.3 mg/dL — ABNORMAL HIGH (ref 2.5–4.6)
Potassium: 4.5 mmol/L (ref 3.5–5.1)
Sodium: 136 mmol/L (ref 135–145)

## 2019-09-07 LAB — RAPID URINE DRUG SCREEN, HOSP PERFORMED
Amphetamines: NOT DETECTED
Barbiturates: NOT DETECTED
Benzodiazepines: NOT DETECTED
Cocaine: NOT DETECTED
Opiates: POSITIVE — AB
Tetrahydrocannabinol: NOT DETECTED

## 2019-09-07 LAB — SURGICAL PATHOLOGY

## 2019-09-07 MED ORDER — NEPRO/CARBSTEADY PO LIQD
237.0000 mL | Freq: Two times a day (BID) | ORAL | Status: DC
  Administered 2019-09-07 – 2019-09-10 (×4): 237 mL via ORAL

## 2019-09-07 MED ORDER — CHLORHEXIDINE GLUCONATE CLOTH 2 % EX PADS: 6.0000 | MEDICATED_PAD | Freq: Every day | CUTANEOUS | Status: DC

## 2019-09-07 MED ORDER — OXYCODONE HCL 5 MG PO TABS
5.0000 mg | ORAL_TABLET | ORAL | Status: DC | PRN
  Administered 2019-09-07: 10 mg via ORAL
  Administered 2019-09-07: 09:00:00 5 mg via ORAL
  Administered 2019-09-07 – 2019-09-10 (×14): 10 mg via ORAL
  Administered 2019-09-10 – 2019-09-11 (×2): 5 mg via ORAL
  Administered 2019-09-11: 10 mg via ORAL
  Filled 2019-09-07 (×4): qty 2
  Filled 2019-09-07: qty 1
  Filled 2019-09-07 (×14): qty 2

## 2019-09-07 NOTE — Progress Notes (Signed)
PROGRESS NOTE  Ashley [name] OAC:[id number] DOB: [birth year] DOA: 09/05/2019 PCP: Sonia Side., FNP   LOS: 1 day   Brief Narrative / Interim history: Ashley [name] is an 69 y.o. female with medical history significant ofrecurrentpancreatitis,GERD, perforated gastric ulcerwith surgical [id number], Hepatitis C(completed Epclusa Rx in Jan 2021),Hx ofIVDA, ESRD on HD, HTN who presented to the ED with epigastric pain for 2 days.The nature of pain was same as when she had her previous episodes and she had such episode about 4-5 times since last July. She also had multiple loose diarrhea, without any changes of abd pain, feeling nausea and has not been eating or drinking since last night. No fever or chills.  Subjective / 24h Interval events: Continues to complain of persistent epigastric abdominal pain along with nausea.  She does want to eat more  Assessment & Plan: Principal Problem Abdominal pain -Thought to be due to recurrent pancreatitis, gastroenterology consult following -Underwent an EGD on 3/30 which showed erosive gastropathy and duodenitis, also she had a single nonbleeding lesion in the duodenum status post APC -We will need outpatient follow-up with endoscopic ultrasound by GI -Slowlyadvancediet, adjust pain medications for better control, continue PPI.  Active Problems ESRD -Nephrology following  History of hep C -Completed treatment  Essential hypertension -Home medications, blood pressure has some high readings but stable for most part  History of drug abuse -States that she no longer uses cocaine  Tobacco use -Counseled for cessation  Scheduled Meds: . amLODipine  10 mg Oral Daily  . Chlorhexidine Gluconate Cloth  6 each Topical Q0600  . cloNIDine  0.1 mg Oral BID  . doxercalciferol  3 mcg Oral Q T,Th,Sa-HD  . feeding supplement  1 Container Oral BID BM  . feeding supplement (PRO-STAT SUGAR FREE 64)  30 mL Oral BID  . ferric citrate  210 mg Oral  TID WC  . heparin  5,000 Units Subcutaneous Q12H  . lidocaine-prilocaine  1 application Topical Q T,Th,Sa-HD  . metoprolol succinate  25 mg Oral Daily  . multivitamin  1 tablet Oral QHS  . pantoprazole  40 mg Oral QAC breakfast   Continuous Infusions: . sodium chloride    . sodium chloride     PRN Meds:.sodium chloride, sodium chloride, acetaminophen **OR** acetaminophen, alteplase, heparin, HYDROmorphone (DILAUDID) injection, lidocaine (PF), lidocaine-prilocaine, ondansetron (ZOFRAN) IV, oxyCODONE, pentafluoroprop-tetrafluoroeth  DVT prophylaxis: SCDs Code Status: Full code Family Communication: Discussed with patient Patient admitted from: Home Anticipated d/c place: Home Barriers to d/c: Has persistent symptoms with poor p.o. intake, slowly advance diet, home when cleared by gastroenterology hopefully 1 to 2 days  Consultants:  Gastroenterology  Procedures:  EGD 3/30 Impression:               - Erosive gastropathy with no stigmata of recent                            bleeding. Biopsied.                           - Duodenitis. Biopsied.                           - A single non-bleeding angiodysplastic lesion in                            the  duodenum. Treated with argon plasma coagulation                            (APC). Thought reasonable to ablate as at higher                            risk of bleeding given co-morbidities                           - The examination was otherwise normal.                           - STILL CANNOT SAY SHE DOES NOT HAVE PANCREATITIS -                            BUT THIS IS NEW INFO AND WILL TREAT AS BELOW -                            LOOKS LIKE SHE HAS BEEN ON PPI IN PAST BUT NOT NOW Recommendation:           - Clear liquid diet.                           - Return patient to hospital ward for ongoing care.                           - Use Protonix (pantoprazole) 40 mg PO daily.                           - Await pathology results. She will need  f/u GI                            clinic and likely need an endoscopic ultrasound to                            look at the pancreas to see if she has changes of                            chronic pancreatitis - unfourtunately that may not                            change management  Antimicrobials: None     Objective: Vitals:   09/06/19 1600 09/06/19 2007 09/07/19 0508 09/07/19 0754  BP: 125/77 (!) 144/82 (!) 143/69 (!) 176/82  Pulse: (!) 103 (!) 59 61 72  Resp: 16 15 16 18   Temp: 98.6 F (37 C) 98.8 F (37.1 C) 98.5 F (36.9 C) 98.1 F (36.7 C)  TempSrc: Oral Oral Oral Oral  SpO2: 98% 100% 99% 98%  Weight: 60.1 kg     Height:        Intake/Output Summary (Last 24 hours) at 09/07/2019 1126 Last data filed at 09/07/2019 0937 Gross per 24 hour  Intake 840 ml  Output 0 ml  Net 840 ml   Filed Weights   09/05/19 0909  09/06/19 1211 09/06/19 1600  Weight: 59.9 kg 60.1 kg 60.1 kg    Examination:  Constitutional: Appears uncomfortable Eyes: no scleral icterus ENMT: Mucous membranes are moist.  Neck: normal, supple Respiratory: clear to auscultation bilaterally, no wheezing, no crackles. Normal respiratory effort.  Cardiovascular: Regular rate and rhythm, no murmurs / rubs / gallops. No LE edema. Abdomen: non distended, mild tenderness in the epigastric area without guarding or rebound. Bowel sounds positive.  Musculoskeletal: no clubbing / cyanosis.  Skin: no rashes Neurologic: No focal deficits  Data Reviewed: I have independently reviewed following labs and imaging studies   CBC: Recent Labs  Lab 09/05/19 0912 09/06/19 1047  WBC 9.1 8.8  NEUTROABS 5.2  --   HGB 13.7 13.2  HCT 41.3 40.2  MCV 91.2 90.5  PLT 259 259   Basic Metabolic Panel: Recent Labs  Lab 09/05/19 0912 09/06/19 1047  NA 137 137  K 5.3* 5.7*  CL 88* 91*  CO2 31 25  GLUCOSE 111* 96  BUN 49* 60*  CREATININE 11.46* 13.83*  CALCIUM 10.1 9.3   Liver Function Tests: Recent Labs  Lab  09/05/19 0912 09/06/19 1047  AST 16 17  ALT 13 7  ALKPHOS 59 58  BILITOT 0.8 0.5  PROT 8.6* 7.6  ALBUMIN 3.9 3.5   Coagulation Profile: No results for input(s): INR, PROTIME in the last 168 hours. HbA1C: No results for input(s): HGBA1C in the last 72 hours. CBG: No results for input(s): GLUCAP in the last 168 hours.  Recent Results (from the past 240 hour(s))  SARS CORONAVIRUS 2 (TAT 6-24 HRS) Nasopharyngeal Nasopharyngeal Swab     Status: None   Collection Time: 09/05/19  1:22 PM   Specimen: Nasopharyngeal Swab  Result Value Ref Range Status   SARS Coronavirus 2 NEGATIVE NEGATIVE Final    Comment: (NOTE) SARS-CoV-2 target nucleic acids are NOT DETECTED. The SARS-CoV-2 RNA is generally detectable in upper and lower respiratory specimens during the acute phase of infection. Negative results do not preclude SARS-CoV-2 infection, do not rule out co-infections with other pathogens, and should not be used as the sole basis for treatment or other patient management decisions. Negative results must be combined with clinical observations, patient history, and epidemiological information. The expected result is Negative. Fact Sheet for Patients: SugarRoll.be Fact Sheet for Healthcare Providers: https://www.woods-mathews.com/ This test is not yet approved or cleared by the [place] FDA and  has been authorized for detection and/or diagnosis of SARS-CoV-2 by FDA under an Emergency Use Authorization (EUA). This EUA will remain  in effect (meaning this test can be used) for the duration of the COVID-19 declaration under Section 56 4(b)(1) of the Act, 21 U.S.C. section 360bbb-3(b)(1), unless the authorization is terminated or revoked sooner. Performed at [facility], [place] [address] [place], [place] [address]   MRSA PCR Screening     Status: None   Collection Time: 09/06/19  8:04 AM   Specimen: Nasal Mucosa; Nasopharyngeal   Result Value Ref Range Status   MRSA by PCR NEGATIVE NEGATIVE Final    Comment:        The GeneXpert MRSA Assay (FDA approved for NASAL specimens only), is one component of a comprehensive MRSA colonization surveillance program. It is not intended to diagnose MRSA infection nor to guide or monitor treatment for MRSA infections. Performed at [facility], [place] [address], [place], [place] [address]      Radiology Studies: No results found.   Marzetta Board, MD,  PhD [facility]  Between 7 am - 7 pm I am available, please contact me via Amion or Securechat  Between 7 pm - 7 am I am not available, please contact night coverage MD/APP via Amion

## 2019-09-07 NOTE — Progress Notes (Signed)
Daily Rounding Note  09/07/2019, 10:01 AM  LOS: 1 day   SUBJECTIVE:   Chief complaint: Abdominal pain, nausea, vomiting, ?  Recurrent pancreatitis.    Still having significant pain, asked hospital MD if he could increase the dosing of pain meds.   Currently has as needed order for Dilaudid 0.5 mg every 4 hours as needed and oxycodone as needed. Nauseated and dry heaving this morning.  On clear liquids but feels like she could tolerate full liquids. No bowel movement for a few days but does not feel constipated.  OBJECTIVE:         Vital signs in last 24 hours:    Temp:  [98 F (36.7 C)-98.8 F (37.1 C)] 98.1 F (36.7 C) (03/31 0754) Pulse Rate:  [54-103] 72 (03/31 0754) Resp:  [12-18] 18 (03/31 0754) BP: (125-176)/(65-92) 176/82 (03/31 0754) SpO2:  [96 %-100 %] 98 % (03/31 0754) Weight:  [60.1 kg] 60.1 kg (03/30 1600) Last BM Date: 09/03/19 Filed Weights   09/05/19 0909 09/06/19 1211 09/06/19 1600  Weight: 59.9 kg 60.1 kg 60.1 kg   General: Pleasant, alert.  Looks chronically ill but not toxic.  Slightly uncomfortable Heart: RRR. Chest: Nonlabored breathing without cough.  Lungs clear bilaterally. Abdomen: Soft.  Tender in the epigastric region primarily but radiating across to bilateral upper abdomen.  No guarding or rebound.  Bowel sounds active, normal quality.  No mass, HSM, bruits, hernias Extremities: No CCE. Neuro/Psych: Oriented x3.  Fully alert.  No gross weakness or deficits.  Intake/Output from previous day: 03/30 0701 - 03/31 0700 In: 1060 [P.O.:960; I.V.:100] Out: 0   Intake/Output this shift: No intake/output data recorded.  Lab Results: Recent Labs    09/05/19 0912 09/06/19 1047  WBC 9.1 8.8  HGB 13.7 13.2  HCT 41.3 40.2  PLT 259 240   BMET Recent Labs    09/05/19 0912 09/06/19 1047  NA 137 137  K 5.3* 5.7*  CL 88* 91*  CO2 31 25  GLUCOSE 111* 96  BUN 49* 60*  CREATININE 11.46*  13.83*  CALCIUM 10.1 9.3   LFT Recent Labs    09/05/19 0912 09/06/19 1047  PROT 8.6* 7.6  ALBUMIN 3.9 3.5  AST 16 17  ALT 13 7  ALKPHOS 59 58  BILITOT 0.8 0.5     Scheduled Meds: . amLODipine  10 mg Oral Daily  . Chlorhexidine Gluconate Cloth  6 each Topical Q0600  . cloNIDine  0.1 mg Oral BID  . doxercalciferol  3 mcg Oral Q T,Th,Sa-HD  . feeding supplement  1 Container Oral BID BM  . feeding supplement (PRO-STAT SUGAR FREE 64)  30 mL Oral BID  . ferric citrate  210 mg Oral TID WC  . heparin  5,000 Units Subcutaneous Q12H  . lidocaine-prilocaine  1 application Topical Q T,Th,Sa-HD  . metoprolol succinate  25 mg Oral Daily  . multivitamin  1 tablet Oral QHS  . pantoprazole  40 mg Oral QAC breakfast   Continuous Infusions: . sodium chloride    . sodium chloride     PRN Meds:.sodium chloride, sodium chloride, acetaminophen **OR** acetaminophen, alteplase, heparin, HYDROmorphone (DILAUDID) injection, lidocaine (PF), lidocaine-prilocaine, ondansetron (ZOFRAN) IV, oxyCODONE, pentafluoroprop-tetrafluoroeth   ASSESMENT:   *  Recurrent acute (on chronic?) pancreatitis?  Undetermined etiology based on previous CT and MRI/MRCP. Lipase 85 >> 82,  LFTs normal.   3/30 EGD: Erosive gastropathy, no stigmata of bleeding, biopsied.  Duodenitis, biopsy.  Non-bleeding  duodenal AVM ablated with APC. 1 x daily oral Protonix in place.  With not on PPI PTA.     PLAN   *  Will need outpt EUS. Will arrange fup w Dr Hilarie Fredrickson in meantime.  *  Fup biopsies from EGD.    *    Advance to full liquid diet    Ashley [name]  09/07/2019, 10:01 AM Phone [phone]

## 2019-09-07 NOTE — Progress Notes (Signed)
[facility] Progress Note  Name: Ashley [name] MRN: [id number] DOB: 08/18/1950  Chief Complaint:  abd pain, n/v  Subjective:  had HD with no UF on 3/30.  Hypertensive but missed multiple blood pressure meds yesterday as below.  States was never told to restart sensipar -she had started back on her own a couple of weeks ago after ending tx for hep C.  Review of systems:  Nausea with dry heaving this AM Denies shortness of breath or CP +abd pain ------------ Background on consult:  Ashley [name] is a 69 y.o. female with a history of recurrent pancreatitis, end-stage renal disease on hemodialysis Tuesday Thursday Saturday, prior cocaine use, hepatitis C, GERD, and hypertension who presented to the hospital with epigastric pain.  She is being admitted with pancreatitis. She states has had a little dizziness with standing.  Hasn't been able to eat or drink much if any for the past couple of days due to abdominal pain.  States has had similar presentation before and is discouraged.  She states additional testing was planned but hasn't yet been completed.  Currently with nausea but no vomiting.  She denies any muscle cramping with HD.  Has been leaving at her EDW as below and last tx was Saturday, 3/27.     Intake/Output Summary (Last 24 hours) at 09/07/2019 1056 Last data filed at 09/06/2019 1700 Gross per 24 hour  Intake 960 ml  Output 0 ml  Net 960 ml    Vitals:  Vitals:   09/06/19 1600 09/06/19 2007 09/07/19 0508 09/07/19 0754  BP: 125/77 (!) 144/82 (!) 143/69 (!) 176/82  Pulse: (!) 103 (!) 59 61 72  Resp: 16 15 16 18   Temp: 98.6 F (37 C) 98.8 F (37.1 C) 98.5 F (36.9 C) 98.1 F (36.7 C)  TempSrc: Oral Oral Oral Oral  SpO2: 98% 100% 99% 98%  Weight: 60.1 kg     Height:         Physical Exam:  General adult female in bed in no acute distress HEENT normocephalic atraumatic extraocular movements intact sclera anicteric Neck supple trachea midline Lungs clear  to auscultation bilaterally normal work of breathing at rest  Heart S1S2 no rub Abdomen soft minimally tender nondistended Extremities no edema  Psych normal mood and affect Neuro - alert and oriented x3 provides hx and follows commands Access LUE AVF bruit and thrill    Medications reviewed  Labs:  BMP Latest Ref Rng & Units 09/06/2019 09/05/2019 07/20/2019  Glucose 70 - 99 mg/dL 96 111(H) 90  BUN 8 - 23 mg/dL 60(H) 49(H) 10  Creatinine 0.44 - 1.00 mg/dL 13.83(H) 11.46(H) 5.78(H)  BUN/Creat Ratio 12 - 28 - - -  Sodium 135 - 145 mmol/L 137 137 137  Potassium 3.5 - 5.1 mmol/L 5.7(H) 5.3(H) 4.5  Chloride 98 - 111 mmol/L 91(L) 88(L) 99  CO2 22 - 32 mmol/L 25 31 27   Calcium 8.9 - 10.3 mg/dL 9.3 10.1 9.4   EGD:  S/p EGD with erosive gastropathy and no stigmata of recent bleeding.  Single nonbleeding angiodysplastic lesion s/p APC.   Outpatient HD orders:  [facility]  TTS schedule  3.5 hours 350 BF, 800 DF 2K/2.25 calcium EDW 59.5 kg and has been getting to EDW; last tx 3/27 and left 59.4 kg hectorol 3 mcg each tx Not on ESA - last Hb 11.4 on 3/18 and got mircera 50 mcg Jan.21, 2021  Assessment/Plan:   # ESRD  - HD per  TTS schedule  - Given patient's ESRD, would avoid morphine for pain control. - note have also stopped sensipar given her nausea for now  -renal panel ordered  # Acute Pancreatitis, recurrent - Given patient's ESRD, would avoid morphine for pain control. (If IV agent needed dilaudid or fentanyl preferred).   - s/p EGD with GI and note plan for outpatient EUS   # Nausea - Supportive care per primary team   # HTN  - follow on home regimen - she is hypertensive but didn't get amlodipine or AM clonidine or metoprolol yesterday  # Anemia CKD  - no acute indication for ESA; last outpatient Hb 11.4 and 13.7 on admission  # Secondary hyperparathyroidism and metabolic bone disease  - Continue hectorol; on [place]; Appears off sensipar per  outpt HD medication list but pt states she started it back two weeks ago.  have stopped her sensipar given her nausea and follow for now and reassess outpatient   Claudia Desanctis, MD 09/07/2019 11:10 AM

## 2019-09-07 NOTE — Plan of Care (Signed)
  Problem: Health Behavior/Discharge Planning: Goal: Ability to manage health-related needs will improve Outcome: Progressing   Problem: Nutrition: Goal: Adequate nutrition will be maintained Outcome: Progressing   

## 2019-09-08 LAB — RENAL FUNCTION PANEL
Albumin: 2.9 g/dL — ABNORMAL LOW (ref 3.5–5.0)
Anion gap: 14 (ref 5–15)
BUN: 33 mg/dL — ABNORMAL HIGH (ref 8–23)
CO2: 29 mmol/L (ref 22–32)
Calcium: 9.5 mg/dL (ref 8.9–10.3)
Chloride: 94 mmol/L — ABNORMAL LOW (ref 98–111)
Creatinine, Ser: 9.16 mg/dL — ABNORMAL HIGH (ref 0.44–1.00)
GFR calc Af Amer: 5 mL/min — ABNORMAL LOW (ref >60)
GFR calc non Af Amer: 4 mL/min — ABNORMAL LOW (ref >60)
Glucose, Bld: 93 mg/dL (ref 70–99)
Phosphorus: 6.1 mg/dL — ABNORMAL HIGH (ref 2.5–4.6)
Potassium: 4.6 mmol/L (ref 3.5–5.1)
Sodium: 137 mmol/L (ref 135–145)

## 2019-09-08 LAB — CBC
HCT: 34.8 % — ABNORMAL LOW (ref 36.0–46.0)
Hemoglobin: 11.5 g/dL — ABNORMAL LOW (ref 12.0–15.0)
MCH: 29.3 pg (ref 26.0–34.0)
MCHC: 33 g/dL (ref 30.0–36.0)
MCV: 88.5 fL (ref 80.0–100.0)
Platelets: 209 10*3/uL (ref 150–400)
RBC: 3.93 MIL/uL (ref 3.87–5.11)
RDW: 16.8 % — ABNORMAL HIGH (ref 11.5–15.5)
WBC: 6.5 10*3/uL (ref 4.0–10.5)
nRBC: 0 % (ref 0.0–0.2)

## 2019-09-08 MED ORDER — SODIUM CHLORIDE 0.9 % IV SOLN: 100.0000 mL | INTRAVENOUS | Status: DC | PRN

## 2019-09-08 MED ORDER — LIDOCAINE HCL (PF) 1 % IJ SOLN: 5.0000 mL | INTRAMUSCULAR | Status: DC | PRN

## 2019-09-08 MED ORDER — LIDOCAINE-PRILOCAINE 2.5-2.5 % EX CREA: 1.0000 "application " | TOPICAL_CREAM | CUTANEOUS | Status: DC | PRN

## 2019-09-08 MED ORDER — HEPARIN SODIUM (PORCINE) 1000 UNIT/ML DIALYSIS: 1000.0000 [IU] | INTRAMUSCULAR | Status: DC | PRN

## 2019-09-08 MED ORDER — PENTAFLUOROPROP-TETRAFLUOROETH EX AERO: 1.0000 "application " | INHALATION_SPRAY | CUTANEOUS | Status: DC | PRN

## 2019-09-08 MED ORDER — HEPARIN SODIUM (PORCINE) 1000 UNIT/ML DIALYSIS
2000.0000 [IU] | INTRAMUSCULAR | Status: DC | PRN
  Filled 2019-09-08: qty 2

## 2019-09-08 MED ORDER — DOXERCALCIFEROL 0.5 MCG PO CAPS
3.0000 ug | ORAL_CAPSULE | ORAL | Status: DC
  Administered 2019-09-08 – 2019-09-10 (×2): 3 ug via ORAL
  Filled 2019-09-08 (×2): qty 6

## 2019-09-08 MED ORDER — HEPARIN SODIUM (PORCINE) 1000 UNIT/ML DIALYSIS: 2000.0000 [IU] | INTRAMUSCULAR | Status: DC | PRN

## 2019-09-08 MED ORDER — ALTEPLASE 2 MG IJ SOLR: 2.0000 mg | Freq: Once | INTRAMUSCULAR | Status: DC | PRN

## 2019-09-08 MED ORDER — LIDOCAINE-PRILOCAINE 2.5-2.5 % EX CREA
1.0000 "application " | TOPICAL_CREAM | CUTANEOUS | Status: DC | PRN
  Filled 2019-09-08: qty 5

## 2019-09-08 NOTE — Progress Notes (Signed)
[facility] Progress Note  Name: Ashley [name] MRN: [id number] DOB: 1951-04-07  Chief Complaint:  abd pain, n/v  Subjective:  had HD with no UF on 3/30.  Possibly home 4/2 per primary note  Patient is about to try lunch.  Review of systems:  Nausea better - about to try lunch Denies shortness of breath or CP +abd pain better - just had a PRN ------------ Background on consult:  Ashley [name] is a 69 y.o. female with a history of recurrent pancreatitis, end-stage renal disease on hemodialysis Tuesday Thursday Saturday, prior cocaine use, hepatitis C, GERD, and hypertension who presented to the hospital with epigastric pain.  She is being admitted with pancreatitis. She states has had a little dizziness with standing.  Hasn't been able to eat or drink much if any for the past couple of days due to abdominal pain.  States has had similar presentation before and is discouraged.  She states additional testing was planned but hasn't yet been completed.  Currently with nausea but no vomiting.  She denies any muscle cramping with HD.  Has been leaving at her EDW as below and last tx was Saturday, 3/27.     Intake/Output Summary (Last 24 hours) at 09/08/2019 1305 Last data filed at 09/07/2019 1900 Gross per 24 hour  Intake 840 ml  Output -  Net 840 ml    Vitals:  Vitals:   09/07/19 1416 09/07/19 2041 09/08/19 0517 09/08/19 0844  BP: (!) 120/54 (!) 143/85 130/80 (!) 136/58  Pulse: 68 73 62 (!) 58  Resp: 17 18 18 18   Temp: 98 F (36.7 C) 98.5 F (36.9 C) 98.1 F (36.7 C) 98.2 F (36.8 C)  TempSrc: Oral Oral Oral Oral  SpO2: 99% 96% 98% 96%  Weight:      Height:         Physical Exam:   General adult female in bed in no acute distress HEENT normocephalic atraumatic extraocular movements intact sclera anicteric Neck supple trachea midline Lungs clear to auscultation bilaterally normal work of breathing at rest  Heart S1S2 no rub Abdomen soft minimally tender  nondistended Extremities no edema  Psych normal mood and affect Neuro - alert and oriented x3 provides hx and follows commands Access LUE AVF bruit and thrill    Medications reviewed  Labs:  BMP Latest Ref Rng & Units 09/08/2019 09/07/2019 09/06/2019  Glucose 70 - 99 mg/dL 93 125(H) 96  BUN 8 - 23 mg/dL 33(H) 21 60(H)  Creatinine 0.44 - 1.00 mg/dL 9.16(H) 7.13(H) 13.83(H)  BUN/Creat Ratio 12 - 28 - - -  Sodium 135 - 145 mmol/L 137 136 137  Potassium 3.5 - 5.1 mmol/L 4.6 4.5 5.7(H)  Chloride 98 - 111 mmol/L 94(L) 96(L) 91(L)  CO2 22 - 32 mmol/L 29 25 25   Calcium 8.9 - 10.3 mg/dL 9.5 9.3 9.3   EGD:  S/p EGD with erosive gastropathy and no stigmata of recent bleeding.  Single nonbleeding angiodysplastic lesion s/p APC.   Outpatient HD orders:  [facility]  TTS schedule  3.5 hours 350 BF, 800 DF 2K/2.25 calcium EDW 59.5 kg and has been getting to EDW; last tx 3/27 and left 59.4 kg hectorol 3 mcg each tx Not on ESA - last Hb 11.4 on 3/18 and got mircera 50 mcg Jan.21, 2021  Assessment/Plan:   # ESRD  - HD per TTS schedule  - Given patient's ESRD, would avoid morphine for pain control. - note have also stopped  sensipar given her nausea for now   # Acute Pancreatitis, recurrent - Given patient's ESRD, would avoid morphine for pain control. (If IV agent needed dilaudid or fentanyl preferred).   - s/p EGD with GI and note plan for outpatient EUS   # Nausea - Supportive care per primary team  # HTN  - follow on home regimen - intermittently missed multiple meds related to HD and normotensive currently  # Anemia CKD  - no acute indication for ESA; last outpatient Hb 11.4 and 13.7 on admission  # Secondary hyperparathyroidism and metabolic bone disease  - Continue hectorol; on [place]; Appears off sensipar per outpt HD medication list but pt states she started it back two weeks ago.  have stopped her sensipar given her nausea and follow for now and  reassess outpatient   Claudia Desanctis, MD 09/08/2019 1:12 PM

## 2019-09-08 NOTE — Progress Notes (Signed)
          Daily Rounding Note  09/08/2019, 9:32 AM  LOS: 2 days   SUBJECTIVE:   Chief complaint: Nausea, vomiting, upper abdominal pain, ?  Recurrent pancreatitis? No bowel movement since Saturday which is 5 days ago.  Her normal is every 3 to 4 days.  She does not feel constipated.  No nausea.  Abdominal pain better but not resolved, still needing to use oxycodone and Dilaudid.  OBJECTIVE:         Vital signs in last 24 hours:    Temp:  [98 F (36.7 C)-98.5 F (36.9 C)] 98.2 F (36.8 C) (04/01 0844) Pulse Rate:  [58-73] 58 (04/01 0844) Resp:  [17-18] 18 (04/01 0844) BP: (120-143)/(54-85) 136/58 (04/01 0844) SpO2:  [96 %-99 %] 96 % (04/01 0844) Last BM Date: 09/03/19 Filed Weights   09/05/19 0909 09/06/19 1211 09/06/19 1600  Weight: 59.9 kg 60.1 kg 60.1 kg   General: Looks somewhat chronically ill but not uncomfortable or toxic. Heart: RRR Chest: Clear.  No labored breathing.  No cough. Abdomen: Soft, active bowel sounds.  Tenderness in epigastrium into the left upper quadrant.  No guarding or rebound. Extremities: No CCE. Neuro/Psych: Oriented x3.  Alert.  No tremors, no gross deficits/weakness.  Intake/Output from previous day: 03/31 0701 - 04/01 0700 In: 1200 [P.O.:1200] Out: -   Intake/Output this shift: No intake/output data recorded.  Lab Results: Recent Labs    09/06/19 1047 09/08/19 0618  WBC 8.8 6.5  HGB 13.2 11.5*  HCT 40.2 34.8*  PLT 240 209   BMET Recent Labs    09/06/19 1047 09/07/19 1227 09/08/19 0618  NA 137 136 137  K 5.7* 4.5 4.6  CL 91* 96* 94*  CO2 25 25 29   GLUCOSE 96 125* 93  BUN 60* 21 33*  CREATININE 13.83* 7.13* 9.16*  CALCIUM 9.3 9.3 9.5   LFT Recent Labs    09/06/19 1047 09/07/19 1227 09/08/19 0618  PROT 7.6  --   --   ALBUMIN 3.5 3.1* 2.9*  AST 17  --   --   ALT 7  --   --   ALKPHOS 58  --   --   BILITOT 0.5  --   --    PT/INR No results for input(s): LABPROT,  INR in the last 72 hours. Hepatitis Panel No results for input(s): HEPBSAG, HCVAB, HEPAIGM, HEPBIGM in the last 72 hours.  Studies/Results: No results found.  ASSESMENT:   *   Recurrent acute, on chronic?,  Pancreatitis.  Etiology undetermined. The/30 EGD with erosive gastropathy biopsied (path: Chronic gastritis, no H. pylori, no metaplasia).  No stigmata of bleeding.  Duodenitis biopsied (path: Peptic duodenitis).  Nonbleeding duodenal AVMs ablated with APC. 1 x daily oral Protonix initiated this admission, no PPI PTA  *   ESRD.  TTS HD.      PLAN   *   Will need outpatient EUS, before scheduling this she will be seen by Dr. Hilarie Fredrickson or app for follow-up office visit.  *    Switched from the soft diet to renal diet as she is a dialysis patient.    Azucena Freed  09/08/2019, 9:32 AM Phone [phone]

## 2019-09-08 NOTE — Progress Notes (Signed)
PROGRESS NOTE  Ashley [name] UUV:[id number] DOB: 04/10/1951 DOA: 09/05/2019 PCP: Sonia Side., FNP   LOS: 2 days   Brief Narrative / Interim history: Ashley [name] is an 69 y.o. female with medical history significant ofrecurrentpancreatitis,GERD, perforated gastric ulcerwith surgical [phone], Hepatitis C(completed Epclusa Rx in Jan 2021),Hx ofIVDA, ESRD on HD, HTN who presented to the ED with epigastric pain for 2 days.The nature of pain was same as when she had her previous episodes and she had such episode about 4-5 times since last July. She also had multiple loose diarrhea, without any changes of abd pain, feeling nausea and has not been eating or drinking since last night. No fever or chills.  Subjective / 24h Interval events: Pain is 8/10 but overall feels improved.  Last episode of emesis was yesterday.  Tolerating full liquid diet.  Pain controlled with oral and IV agents  Assessment & Plan: Principal Problem Abdominal pain -Thought to be due to recurrent pancreatitis, gastroenterology consult following -Underwent an EGD on 3/30 which showed erosive gastropathy and duodenitis, also she had a single nonbleeding lesion in the duodenum status post APC -We will need outpatient follow-up with endoscopic ultrasound by GI -Slowly advance diet, will put on soft/renal diet today.  If tolerates potentially home tomorrow  Active Problems ESRD -Nephrology following  History of hep C -Completed treatment  Essential hypertension -Home medications, blood pressure overall stable t  History of drug abuse -States that she no longer uses cocaine  Tobacco use -Counseled for cessation  Scheduled Meds: . amLODipine  10 mg Oral Daily  . Chlorhexidine Gluconate Cloth  6 each Topical Q0600  . cloNIDine  0.1 mg Oral BID  . doxercalciferol  3 mcg Oral Q T,Th,Sa-HD  . feeding supplement (NEPRO CARB STEADY)  237 mL Oral BID BM  . feeding supplement (PRO-STAT SUGAR FREE 64)  30  mL Oral BID  . ferric citrate  210 mg Oral TID WC  . heparin  5,000 Units Subcutaneous Q12H  . lidocaine-prilocaine  1 application Topical Q T,Th,Sa-HD  . metoprolol succinate  25 mg Oral Daily  . multivitamin  1 tablet Oral QHS  . pantoprazole  40 mg Oral QAC breakfast   Continuous Infusions: . sodium chloride    . sodium chloride    . sodium chloride    . sodium chloride    . sodium chloride    . sodium chloride     PRN Meds:.sodium chloride, sodium chloride, sodium chloride, sodium chloride, sodium chloride, sodium chloride, acetaminophen **OR** acetaminophen, alteplase, heparin, heparin, heparin, [START ON 09/09/2019] heparin, HYDROmorphone (DILAUDID) injection, lidocaine (PF), lidocaine-prilocaine, ondansetron (ZOFRAN) IV, oxyCODONE, pentafluoroprop-tetrafluoroeth  DVT prophylaxis: SCDs Code Status: Full code Family Communication: Discussed with patient Patient admitted from: Home Anticipated d/c place: Home Barriers to d/c: Symptoms improving but not resolved requiring still IV pain medications, advance diet if tolerates home within 24 hours  Consultants:  Gastroenterology  Procedures:  EGD 3/30 Impression:               - Erosive gastropathy with no stigmata of recent                            bleeding. Biopsied.                           - Duodenitis. Biopsied.                           -  A single non-bleeding angiodysplastic lesion in                            the duodenum. Treated with argon plasma coagulation                            (APC). Thought reasonable to ablate as at higher                            risk of bleeding given co-morbidities                           - The examination was otherwise normal.                           - STILL CANNOT SAY SHE DOES NOT HAVE PANCREATITIS -                            BUT THIS IS NEW INFO AND WILL TREAT AS BELOW -                            LOOKS LIKE SHE HAS BEEN ON PPI IN PAST BUT NOT NOW Recommendation:           -  Clear liquid diet.                           - Return patient to hospital ward for ongoing care.                           - Use Protonix (pantoprazole) 40 mg PO daily.                           - Await pathology results. She will need f/u GI                            clinic and likely need an endoscopic ultrasound to                            look at the pancreas to see if she has changes of                            chronic pancreatitis - unfourtunately that may not                            change management  Antimicrobials: None     Objective: Vitals:   09/07/19 1416 09/07/19 2041 09/08/19 0517 09/08/19 0844  BP: (!) 120/54 (!) 143/85 130/80 (!) 136/58  Pulse: 68 73 62 (!) 58  Resp: 17 18 18 18   Temp: 98 F (36.7 C) 98.5 F (36.9 C) 98.1 F (36.7 C) 98.2 F (36.8 C)  TempSrc: Oral Oral Oral Oral  SpO2: 99% 96% 98% 96%  Weight:      Height:        Intake/Output Summary (Last 24 hours) at  09/08/2019 1040 Last data filed at 09/07/2019 1900 Gross per 24 hour  Intake 840 ml  Output --  Net 840 ml   Filed Weights   09/05/19 0909 09/06/19 1211 09/06/19 1600  Weight: 59.9 kg 60.1 kg 60.1 kg    Examination:  Constitutional: No distress, more comfortable today Eyes: No scleral icterus ENMT: Moist mucous membranes Neck: normal, supple Respiratory: Clear bilaterally, no wheezing or crackles, normal respiratory effort Cardiovascular: Regular rate and rhythm, no murmurs, no edema Abdomen: Soft, persistent tenderness in the epigastric area no guarding or rebound, bowel sounds positive Musculoskeletal: no clubbing / cyanosis.  Skin: No rashes appreciated Neurologic: No focal deficits  Data Reviewed: I have independently reviewed following labs and imaging studies   CBC: Recent Labs  Lab 09/05/19 0912 09/06/19 1047 09/08/19 0618  WBC 9.1 8.8 6.5  NEUTROABS 5.2  --   --   HGB 13.7 13.2 11.5*  HCT 41.3 40.2 34.8*  MCV 91.2 90.5 88.5  PLT 259 240 767   Basic  Metabolic Panel: Recent Labs  Lab 09/05/19 0912 09/06/19 1047 09/07/19 1227 09/08/19 0618  NA 137 137 136 137  K 5.3* 5.7* 4.5 4.6  CL 88* 91* 96* 94*  CO2 31 25 25 29   GLUCOSE 111* 96 125* 93  BUN 49* 60* 21 33*  CREATININE 11.46* 13.83* 7.13* 9.16*  CALCIUM 10.1 9.3 9.3 9.5  PHOS  --   --  5.3* 6.1*   Liver Function Tests: Recent Labs  Lab 09/05/19 0912 09/06/19 1047 09/07/19 1227 09/08/19 0618  AST 16 17  --   --   ALT 13 7  --   --   ALKPHOS 59 58  --   --   BILITOT 0.8 0.5  --   --   PROT 8.6* 7.6  --   --   ALBUMIN 3.9 3.5 3.1* 2.9*   Coagulation Profile: No results for input(s): INR, PROTIME in the last 168 hours. HbA1C: No results for input(s): HGBA1C in the last 72 hours. CBG: No results for input(s): GLUCAP in the last 168 hours.  Recent Results (from the past 240 hour(s))  SARS CORONAVIRUS 2 (TAT 6-24 HRS) Nasopharyngeal Nasopharyngeal Swab     Status: None   Collection Time: 09/05/19  1:22 PM   Specimen: Nasopharyngeal Swab  Result Value Ref Range Status   SARS Coronavirus 2 NEGATIVE NEGATIVE Final    Comment: (NOTE) SARS-CoV-2 target nucleic acids are NOT DETECTED. The SARS-CoV-2 RNA is generally detectable in upper and lower respiratory specimens during the acute phase of infection. Negative results do not preclude SARS-CoV-2 infection, do not rule out co-infections with other pathogens, and should not be used as the sole basis for treatment or other patient management decisions. Negative results must be combined with clinical observations, patient history, and epidemiological information. The expected result is Negative. Fact Sheet for Patients: SugarRoll.be Fact Sheet for Healthcare Providers: https://www.woods-mathews.com/ This test is not yet approved or cleared by the [place] FDA and  has been authorized for detection and/or diagnosis of SARS-CoV-2 by FDA under an Emergency Use Authorization  (EUA). This EUA will remain  in effect (meaning this test can be used) for the duration of the COVID-19 declaration under Section 56 4(b)(1) of the Act, 21 U.S.C. section 360bbb-3(b)(1), unless the authorization is terminated or revoked sooner. Performed at [facility], [place] [address] [place], [place] [address]   MRSA PCR Screening     Status: None   Collection Time: 09/06/19  8:04 AM   Specimen: Nasal Mucosa; Nasopharyngeal  Result Value Ref Range Status   MRSA by PCR NEGATIVE NEGATIVE Final    Comment:        The GeneXpert MRSA Assay (FDA approved for NASAL specimens only), is one component of a comprehensive MRSA colonization surveillance program. It is not intended to diagnose MRSA infection nor to guide or monitor treatment for MRSA infections. Performed at [facility], [place] [address], [place], [place] [address]      Radiology Studies: No results found.   Ashley Board, MD, PhD [facility]  Between 7 am - 7 pm I am available, please contact me via Amion or Securechat  Between 7 pm - 7 am I am not available, please contact night coverage MD/APP via Amion

## 2019-09-08 NOTE — Plan of Care (Signed)
  Problem: Clinical Measurements: Goal: Diagnostic test results will improve Outcome: Progressing   Problem: Activity: Goal: Risk for activity intolerance will decrease Outcome: Progressing   Problem: Pain Managment: Goal: General experience of comfort will improve Outcome: Progressing   

## 2019-09-09 MED ORDER — PANCRELIPASE (LIP-PROT-AMYL) 12000-38000 UNITS PO CPEP
12000.0000 [IU] | ORAL_CAPSULE | Freq: Three times a day (TID) | ORAL | Status: DC
  Administered 2019-09-09 – 2019-09-10 (×2): 12000 [IU] via ORAL
  Filled 2019-09-09 (×4): qty 1

## 2019-09-09 MED ORDER — CHLORHEXIDINE GLUCONATE CLOTH 2 % EX PADS
6.0000 | MEDICATED_PAD | Freq: Every day | CUTANEOUS | Status: DC
  Administered 2019-09-10: 6 via TOPICAL

## 2019-09-09 MED ORDER — CHLORHEXIDINE GLUCONATE CLOTH 2 % EX PADS
6.0000 | MEDICATED_PAD | Freq: Every day | CUTANEOUS | Status: DC
  Administered 2019-09-09 – 2019-09-10 (×2): 6 via TOPICAL

## 2019-09-09 NOTE — Plan of Care (Signed)
  Problem: Education: Goal: Knowledge of General Education information will improve Description: Including pain rating scale, medication(s)/side effects and non-pharmacologic comfort measures Outcome: Progressing   Problem: Clinical Measurements: Goal: Diagnostic test results will improve Outcome: Progressing Goal: Respiratory complications will improve Outcome: Progressing   Problem: Activity: Goal: Risk for activity intolerance will decrease Outcome: Progressing   Problem: Safety: Goal: Ability to remain free from injury will improve Outcome: Progressing   

## 2019-09-09 NOTE — Plan of Care (Signed)
  Problem: Clinical Measurements: Goal: Ability to maintain clinical measurements within normal limits will improve Outcome: Progressing   Problem: Coping: Goal: Level of anxiety will decrease Outcome: Progressing   Problem: Safety: Goal: Ability to remain free from injury will improve Outcome: Progressing   Problem: Skin Integrity: Goal: Risk for impaired skin integrity will decrease Outcome: Progressing

## 2019-09-09 NOTE — Progress Notes (Signed)
PROGRESS NOTE  Ashley [name]:[id number] DOB: [birth year] DOA: 09/05/2019 PCP: Sonia Side., FNP   LOS: 3 days   Brief Narrative / Interim history: Ashley [name] is an 69 y.o. female with medical history significant ofrecurrentpancreatitis,GERD, perforated gastric ulcerwith surgical [phone], Hepatitis C(completed Epclusa Rx in Jan 2021),Hx ofIVDA, ESRD on HD, HTN who presented to the ED with epigastric pain for 2 days.The nature of pain was same as when she had her previous episodes and she had such episode about 4-5 times since last July. She also had multiple loose diarrhea, without any changes of abd pain, feeling nausea and has not been eating or drinking since last night. No fever or chills.  Subjective / 24h Interval events: She was advanced to solid food yesterday, low-fat, and had significantly worsening of her pain and nausea, could not eat  Assessment & Plan: Principal Problem Abdominal pain due to recurrent pancreatitis -Thought to be due to recurrent pancreatitis, gastroenterology consult following -Underwent an EGD on 3/30 which showed erosive gastropathy and duodenitis, also she had a single nonbleeding lesion in the duodenum status post APC -We will need outpatient follow-up with endoscopic ultrasound by GI -Unable to tolerate diet advancement yesterday and she is feeling worse this morning.  Downgrade back to full liquid which is the last diet that she actually tolerated.  She is barely eating.  Prior GI notes did mention Creon, will start today  Active Problems ESRD -Nephrology following, patient refused dialysis last night because it was close to baseline when she would start  History of hep C -Completed treatment  Essential hypertension -Blood pressure overall stable a bit on the high side due to pain  History of drug abuse -States that she no longer uses cocaine  Tobacco use -Counseled for cessation  Scheduled Meds: . amLODipine  10 mg Oral  Daily  . Chlorhexidine Gluconate Cloth  6 each Topical Q0600  . cloNIDine  0.1 mg Oral BID  . doxercalciferol  3 mcg Oral Q T,Th,Sa-HD  . feeding supplement (NEPRO CARB STEADY)  237 mL Oral BID BM  . feeding supplement (PRO-STAT SUGAR FREE 64)  30 mL Oral BID  . ferric citrate  210 mg Oral TID WC  . heparin  5,000 Units Subcutaneous Q12H  . lidocaine-prilocaine  1 application Topical Q T,Th,Sa-HD  . metoprolol succinate  25 mg Oral Daily  . multivitamin  1 tablet Oral QHS  . pantoprazole  40 mg Oral QAC breakfast   Continuous Infusions: . sodium chloride    . sodium chloride    . sodium chloride    . sodium chloride    . sodium chloride    . sodium chloride     PRN Meds:.sodium chloride, sodium chloride, sodium chloride, sodium chloride, sodium chloride, sodium chloride, acetaminophen **OR** acetaminophen, alteplase, heparin, heparin, heparin, heparin, HYDROmorphone (DILAUDID) injection, lidocaine (PF), lidocaine-prilocaine, ondansetron (ZOFRAN) IV, oxyCODONE, pentafluoroprop-tetrafluoroeth  DVT prophylaxis: SCDs Code Status: Full code Family Communication: Discussed with patient Patient admitted from: Home Anticipated d/c place: Home Barriers to d/c: Had recurrent symptoms and downgraded diet.  Anticipate home when improved  Consultants:  Gastroenterology  Procedures:  EGD 3/30 Impression:               - Erosive gastropathy with no stigmata of recent                            bleeding. Biopsied.                           -  Duodenitis. Biopsied.                           - A single non-bleeding angiodysplastic lesion in                            the duodenum. Treated with argon plasma coagulation                            (APC). Thought reasonable to ablate as at higher                            risk of bleeding given co-morbidities                           - The examination was otherwise normal.                           - STILL CANNOT SAY SHE DOES NOT HAVE  PANCREATITIS -                            BUT THIS IS NEW INFO AND WILL TREAT AS BELOW -                            LOOKS LIKE SHE HAS BEEN ON PPI IN PAST BUT NOT NOW Recommendation:           - Clear liquid diet.                           - Return patient to hospital ward for ongoing care.                           - Use Protonix (pantoprazole) 40 mg PO daily.                           - Await pathology results. She will need f/u GI                            clinic and likely need an endoscopic ultrasound to                            look at the pancreas to see if she has changes of                            chronic pancreatitis - unfourtunately that may not                            change management  Antimicrobials: None     Objective: Vitals:   09/08/19 0844 09/08/19 1945 09/09/19 0409 09/09/19 0803  BP: (!) 136/58 (!) 161/66 138/60 (!) 156/74  Pulse: (!) 58 70 72 67  Resp: 18 18 16 18   Temp: 98.2 F (36.8 C) 98.4 F (36.9 C) 98.2 F (36.8 C) 98.5 F (36.9 C)  TempSrc: Oral Oral  Oral Oral  SpO2: 96% 100% 100% 97%  Weight:      Height:       No intake or output data in the 24 hours ending 09/09/19 1156 Filed Weights   09/05/19 0909 09/06/19 1211 09/06/19 1600  Weight: 59.9 kg 60.1 kg 60.1 kg    Examination:  Constitutional: Appears uncomfortable Eyes: No scleral icterus ENMT: mmm Neck: normal, supple Respiratory: Clear bilaterally, no wheezing, no crackles Cardiovascular: Regular rate and rhythm, no edema Abdomen: Soft, persistent epigastric tenderness without guarding or rebound Musculoskeletal: no clubbing / cyanosis.  Skin: No rashes seen Neurologic: Nonfocal  Data Reviewed: I have independently reviewed following labs and imaging studies   CBC: Recent Labs  Lab 09/05/19 0912 09/06/19 1047 09/08/19 0618  WBC 9.1 8.8 6.5  NEUTROABS 5.2  --   --   HGB 13.7 13.2 11.5*  HCT 41.3 40.2 34.8*  MCV 91.2 90.5 88.5  PLT 259 240 010   Basic Metabolic  Panel: Recent Labs  Lab 09/05/19 0912 09/06/19 1047 09/07/19 1227 09/08/19 0618  NA 137 137 136 137  K 5.3* 5.7* 4.5 4.6  CL 88* 91* 96* 94*  CO2 31 25 25 29   GLUCOSE 111* 96 125* 93  BUN 49* 60* 21 33*  CREATININE 11.46* 13.83* 7.13* 9.16*  CALCIUM 10.1 9.3 9.3 9.5  PHOS  --   --  5.3* 6.1*   Liver Function Tests: Recent Labs  Lab 09/05/19 0912 09/06/19 1047 09/07/19 1227 09/08/19 0618  AST 16 17  --   --   ALT 13 7  --   --   ALKPHOS 59 58  --   --   BILITOT 0.8 0.5  --   --   PROT 8.6* 7.6  --   --   ALBUMIN 3.9 3.5 3.1* 2.9*   Coagulation Profile: No results for input(s): INR, PROTIME in the last 168 hours. HbA1C: No results for input(s): HGBA1C in the last 72 hours. CBG: No results for input(s): GLUCAP in the last 168 hours.  Recent Results (from the past 240 hour(s))  SARS CORONAVIRUS 2 (TAT 6-24 HRS) Nasopharyngeal Nasopharyngeal Swab     Status: None   Collection Time: 09/05/19  1:22 PM   Specimen: Nasopharyngeal Swab  Result Value Ref Range Status   SARS Coronavirus 2 NEGATIVE NEGATIVE Final    Comment: (NOTE) SARS-CoV-2 target nucleic acids are NOT DETECTED. The SARS-CoV-2 RNA is generally detectable in upper and lower respiratory specimens during the acute phase of infection. Negative results do not preclude SARS-CoV-2 infection, do not rule out co-infections with other pathogens, and should not be used as the sole basis for treatment or other patient management decisions. Negative results must be combined with clinical observations, patient history, and epidemiological information. The expected result is Negative. Fact Sheet for Patients: SugarRoll.be Fact Sheet for Healthcare Providers: https://www.woods-mathews.com/ This test is not yet approved or cleared by the [place] FDA and  has been authorized for detection and/or diagnosis of SARS-CoV-2 by FDA under an Emergency Use Authorization (EUA). This  EUA will remain  in effect (meaning this test can be used) for the duration of the COVID-19 declaration under Section 56 4(b)(1) of the Act, 21 U.S.C. section 360bbb-3(b)(1), unless the authorization is terminated or revoked sooner. Performed at [facility], [place] [address], [place], [place] [address]   MRSA PCR Screening     Status: None   Collection Time: 09/06/19  8:04 AM   Specimen: Nasal Mucosa; Nasopharyngeal  Result Value Ref Range Status   MRSA by PCR NEGATIVE NEGATIVE Final    Comment:        The GeneXpert MRSA Assay (FDA approved for NASAL specimens only), is one component of a comprehensive MRSA colonization surveillance program. It is not intended to diagnose MRSA infection nor to guide or monitor treatment for MRSA infections. Performed at [facility], [place] [address], [place], [place] [address]      Radiology Studies: No results found.   Marzetta Board, MD, PhD [facility]  Between 7 am - 7 pm I am available, please contact me via Amion or Securechat  Between 7 pm - 7 am I am not available, please contact night coverage MD/APP via Amion

## 2019-09-09 NOTE — Progress Notes (Signed)
Declined HD  At 0000

## 2019-09-09 NOTE — Progress Notes (Signed)
[facility] Progress Note  Name: Ashley [name] MRN: [id number] DOB: 1950-06-19  Chief Complaint:  abd pain, n/v  Subjective:  had HD with no UF on 3/30.  She refused HD yesterday because they came for her late at night and she states was tired.  hasn't had labs today.  4/1 K was 4.6  Review of systems:  Nausea yesterday and didn't really tolerate a salad; states abd pain; no vomiting Denies shortness of breath or CP  ------------ Background on consult:  Ashley [name] is a 69 y.o. female with a history of recurrent pancreatitis, end-stage renal disease on hemodialysis Tuesday Thursday Saturday, prior cocaine use, hepatitis C, GERD, and hypertension who presented to the hospital with epigastric pain.  She is being admitted with pancreatitis. She states has had a little dizziness with standing.  Hasn't been able to eat or drink much if any for the past couple of days due to abdominal pain.  States has had similar presentation before and is discouraged.  She states additional testing was planned but hasn't yet been completed.  Currently with nausea but no vomiting.  She denies any muscle cramping with HD.  Has been leaving at her EDW as below and last tx was Saturday, 3/27.    No intake or output data in the 24 hours ending 09/09/19 1047  Vitals:  Vitals:   09/08/19 0844 09/08/19 1945 09/09/19 0409 09/09/19 0803  BP: (!) 136/58 (!) 161/66 138/60 (!) 156/74  Pulse: (!) 58 70 72 67  Resp: 18 18 16 18   Temp: 98.2 F (36.8 C) 98.4 F (36.9 C) 98.2 F (36.8 C) 98.5 F (36.9 C)  TempSrc: Oral Oral Oral Oral  SpO2: 96% 100% 100% 97%  Weight:      Height:         Physical Exam:    General adult female in bed in no acute distress HEENT normocephalic atraumatic extraocular movements intact sclera anicteric Neck supple trachea midline Lungs clear to auscultation bilaterally normal work of breathing at rest  Heart S1S2 no rub Abdomen soft minimally tender  nondistended Extremities no edema  Psych normal mood and affect Neuro - alert and oriented x3 provides hx and follows commands Access LUE AVF bruit and thrill    Medications reviewed  Labs:  BMP Latest Ref Rng & Units 09/08/2019 09/07/2019 09/06/2019  Glucose 70 - 99 mg/dL 93 125(H) 96  BUN 8 - 23 mg/dL 33(H) 21 60(H)  Creatinine 0.44 - 1.00 mg/dL 9.16(H) 7.13(H) 13.83(H)  BUN/Creat Ratio 12 - 28 - - -  Sodium 135 - 145 mmol/L 137 136 137  Potassium 3.5 - 5.1 mmol/L 4.6 4.5 5.7(H)  Chloride 98 - 111 mmol/L 94(L) 96(L) 91(L)  CO2 22 - 32 mmol/L 29 25 25   Calcium 8.9 - 10.3 mg/dL 9.5 9.3 9.3   EGD:  S/p EGD with erosive gastropathy and no stigmata of recent bleeding.  Single nonbleeding angiodysplastic lesion s/p APC.   Outpatient HD orders:  [facility]  TTS schedule  3.5 hours 350 BF, 800 DF 2K/2.25 calcium EDW 59.5 kg and has been getting to EDW; last tx 3/27 and left 59.4 kg hectorol 3 mcg each tx Not on ESA - last Hb 11.4 on 3/18 and got mircera 50 mcg Jan.21, 2021  Assessment/Plan:   # ESRD  - HD per TTS schedule.  Refused treatment on 4/1 and willing for short tx on 4/2 then HD on 4/3 per TTS schedule to get back  on schedule - Given patient's ESRD, would avoid morphine for pain control.  # Acute Pancreatitis, recurrent - Given patient's ESRD, would avoid morphine for pain control. (If IV agent needed dilaudid or fentanyl preferred).   - s/p EGD with GI and note plan for outpatient EUS   # Nausea - Supportive care per primary team  # HTN  - follow on home regimen - intermittently missed multiple meds related to HD and normotensive currently  # Anemia CKD  - no acute indication for ESA; last outpatient Hb 11.4 and 13.7 on admission  # Secondary hyperparathyroidism and metabolic bone disease  - Continue hectorol; on [place]; Appears off sensipar per outpt HD medication list but pt states she started it back two weeks ago (self-initiated after  she states she finished her hepatitis C regimen).  Have stopped her sensipar given her nausea and would follow for now and reassess outpatient   Claudia Desanctis, MD 09/09/2019 11:18 AM

## 2019-09-10 LAB — RENAL FUNCTION PANEL
Albumin: 2.9 g/dL — ABNORMAL LOW (ref 3.5–5.0)
Anion gap: 14 (ref 5–15)
BUN: 34 mg/dL — ABNORMAL HIGH (ref 8–23)
CO2: 25 mmol/L (ref 22–32)
Calcium: 9.3 mg/dL (ref 8.9–10.3)
Chloride: 96 mmol/L — ABNORMAL LOW (ref 98–111)
Creatinine, Ser: 8.07 mg/dL — ABNORMAL HIGH (ref 0.44–1.00)
GFR calc Af Amer: 5 mL/min — ABNORMAL LOW (ref >60)
GFR calc non Af Amer: 5 mL/min — ABNORMAL LOW (ref >60)
Glucose, Bld: 129 mg/dL — ABNORMAL HIGH (ref 70–99)
Phosphorus: 4.8 mg/dL — ABNORMAL HIGH (ref 2.5–4.6)
Potassium: 4.7 mmol/L (ref 3.5–5.1)
Sodium: 135 mmol/L (ref 135–145)

## 2019-09-10 LAB — CBC
HCT: 34.8 % — ABNORMAL LOW (ref 36.0–46.0)
Hemoglobin: 11.4 g/dL — ABNORMAL LOW (ref 12.0–15.0)
MCH: 29.6 pg (ref 26.0–34.0)
MCHC: 32.8 g/dL (ref 30.0–36.0)
MCV: 90.4 fL (ref 80.0–100.0)
Platelets: 192 10*3/uL (ref 150–400)
RBC: 3.85 MIL/uL — ABNORMAL LOW (ref 3.87–5.11)
RDW: 17.2 % — ABNORMAL HIGH (ref 11.5–15.5)
WBC: 6.8 10*3/uL (ref 4.0–10.5)
nRBC: 0 % (ref 0.0–0.2)

## 2019-09-10 MED ORDER — PANCRELIPASE (LIP-PROT-AMYL) 36000-114000 UNITS PO CPEP
36000.0000 [IU] | ORAL_CAPSULE | Freq: Three times a day (TID) | ORAL | Status: DC
  Administered 2019-09-10 – 2019-09-11 (×4): 36000 [IU] via ORAL
  Filled 2019-09-10 (×5): qty 1

## 2019-09-10 NOTE — Progress Notes (Signed)
[facility] Progress Note  Name: Ashley [name] MRN: [id number] DOB: 05-23-1951  Chief Complaint:  abd pain, n/v  Subjective:  Seen in room. Didn't tolerated diet yesterday w nausea. Feels better today. Asking about lunch. Denies CP, SOB. For HD today.    ------------ Background on consult:  Ashley [name] is a 69 y.o. female with a history of recurrent pancreatitis, end-stage renal disease on hemodialysis Tuesday Thursday Saturday, prior cocaine use, hepatitis C, GERD, and hypertension who presented to the hospital with epigastric pain.  She is being admitted with pancreatitis. She states has had a little dizziness with standing.  Hasn't been able to eat or drink much if any for the past couple of days due to abdominal pain.  States has had similar presentation before and is discouraged.  She states additional testing was planned but hasn't yet been completed.  Currently with nausea but no vomiting.  She denies any muscle cramping with HD.  Has been leaving at her EDW as below and last tx was Saturday, 3/27.     Intake/Output Summary (Last 24 hours) at 09/10/2019 1109 Last data filed at 09/09/2019 1455 Gross per 24 hour  Intake -  Output 438 ml  Net -438 ml    Vitals:  Vitals:   09/09/19 1700 09/09/19 1931 09/10/19 0534 09/10/19 0752  BP: (!) 150/75 (!) 157/71 (!) 154/70 (!) 152/63  Pulse: 69 66 75 (!) 56  Resp: 20 16 16 17   Temp: 98.8 F (37.1 C) 98.8 F (37.1 C) 98.7 F (37.1 C) 98.2 F (36.8 C)  TempSrc: Oral Oral Oral Oral  SpO2: 99% 100% 99% 97%  Weight:      Height:         Physical Exam:    General adult female in bed in no acute distress HEENT normocephalic atraumatic extraocular movements intact sclera anicteric Neck supple trachea midline Lungs clear to auscultation bilaterally normal work of breathing at rest  Heart S1S2 no rub Abdomen soft minimally tender nondistended Extremities no edema  Psych normal mood and affect Neuro - alert and  oriented x3 provides hx and follows commands Access LUE AVF bruit and thrill    Medications reviewed  Labs:  BMP Latest Ref Rng & Units 09/08/2019 09/07/2019 09/06/2019  Glucose 70 - 99 mg/dL 93 125(H) 96  BUN 8 - 23 mg/dL 33(H) 21 60(H)  Creatinine 0.44 - 1.00 mg/dL 9.16(H) 7.13(H) 13.83(H)  BUN/Creat Ratio 12 - 28 - - -  Sodium 135 - 145 mmol/L 137 136 137  Potassium 3.5 - 5.1 mmol/L 4.6 4.5 5.7(H)  Chloride 98 - 111 mmol/L 94(L) 96(L) 91(L)  CO2 22 - 32 mmol/L 29 25 25   Calcium 8.9 - 10.3 mg/dL 9.5 9.3 9.3   EGD:  S/p EGD with erosive gastropathy and no stigmata of recent bleeding.  Single nonbleeding angiodysplastic lesion s/p APC.   Outpatient HD orders:  [facility]  TTS schedule  3.5 hours 350 BF, 800 DF 2K/2.25 calcium EDW 59.5 kg and has been getting to EDW; last tx 3/27 and left 59.4 kg hectorol 3 mcg each tx Not on ESA - last Hb 11.4 on 3/18 and got mircera 50 mcg Jan.21, 2021  Assessment/Plan:   # ESRD  - HD per TTS schedule.  Refused treatment on 4/1 and willing for short tx on 4/2. Back on schedule 4/3.  - Not to EDW yet. UF to EDW as tolerated.   # Acute Pancreatitis, recurrent - Given patient's ESRD,  would avoid morphine for pain control. (If IV agent needed dilaudid or fentanyl preferred).   - s/p EGD with GI and note plan for outpatient EUS  -Advancing diet as tolerated   # Nausea - Supportive care per primary team  # HTN  - follow on home regimen - intermittently missed multiple meds related to HD   # Anemia CKD  - no acute indication for ESA; last outpatient Hb 11.4 and 13.7 on admission  # Secondary hyperparathyroidism and metabolic bone disease  - Continue hectorol; on [place]; Appears off sensipar per outpt HD medication list but pt states she started it back two weeks ago (self-initiated after she states she finished her hepatitis C regimen).  Have stopped her sensipar given her nausea and would follow for now and reassess  outpatient   Lynnda Child, PA-C 09/10/2019 11:09 AM

## 2019-09-10 NOTE — Progress Notes (Signed)
PROGRESS NOTE  Ashley [name]:[id number] DOB: 1950/09/11 DOA: 09/05/2019 PCP: Sonia Side., FNP   LOS: 4 days   Brief Narrative / Interim history: OFILIA RAYON is an 69 y.o. female with medical history significant ofrecurrentpancreatitis,GERD, perforated gastric ulcerwith surgical [phone], Hepatitis C(completed Epclusa Rx in Jan 2021),Hx ofIVDA, ESRD on HD, HTN who presented to the ED with epigastric pain for 2 days.The nature of pain was same as when she had her previous episodes and she had such episode about 4-5 times since last July. She also had multiple loose diarrhea, without any changes of abd pain, feeling nausea and has not been eating or drinking since last night. No fever or chills.  Subjective / 24h Interval events: Back on liquid diet since yesterday due to worsening, now improved and wants to try solids again.   Assessment & Plan: Principal Problem Abdominal pain due to recurrent pancreatitis -Thought to be due to recurrent pancreatitis, gastroenterology consult following -Underwent an EGD on 3/30 which showed erosive gastropathy and duodenitis, also she had a single nonbleeding lesion in the duodenum status post APC -We will need outpatient follow-up with endoscopic ultrasound by GI -Unable to tolerate diet advancement few days ago and was downgraded to liquids yesterday, back to being better today and will re-advance to solid and monitor. If tolerates home tomorrow. Also will increase creon today   Active Problems  ESRD -Nephrology following, HD today   History of hep C -Completed treatment  Essential hypertension -BP high due to pain, HD today   History of drug abuse -States that she no longer uses cocaine  Tobacco use -Counseled for cessation  Scheduled Meds: . amLODipine  10 mg Oral Daily  . Chlorhexidine Gluconate Cloth  6 each Topical Q0600  . Chlorhexidine Gluconate Cloth  6 each Topical Q0600  . cloNIDine  0.1 mg Oral BID  .  doxercalciferol  3 mcg Oral Q T,Th,Sa-HD  . feeding supplement (NEPRO CARB STEADY)  237 mL Oral BID BM  . feeding supplement (PRO-STAT SUGAR FREE 64)  30 mL Oral BID  . ferric citrate  210 mg Oral TID WC  . heparin  5,000 Units Subcutaneous Q12H  . lidocaine-prilocaine  1 application Topical Q T,Th,Sa-HD  . lipase/protease/amylase  36,000 Units Oral TID AC  . metoprolol succinate  25 mg Oral Daily  . multivitamin  1 tablet Oral QHS  . pantoprazole  40 mg Oral QAC breakfast   Continuous Infusions: . sodium chloride    . sodium chloride    . sodium chloride    . sodium chloride    . sodium chloride    . sodium chloride     PRN Meds:.sodium chloride, sodium chloride, sodium chloride, sodium chloride, sodium chloride, sodium chloride, acetaminophen **OR** acetaminophen, alteplase, heparin, heparin, heparin, heparin, HYDROmorphone (DILAUDID) injection, lidocaine (PF), lidocaine-prilocaine, ondansetron (ZOFRAN) IV, oxyCODONE, pentafluoroprop-tetrafluoroeth  DVT prophylaxis: SCDs Code Status: Full code Family Communication: Discussed with patient Patient admitted from: Home Anticipated d/c place: Home Barriers to d/c: 24h if tolerates solid diet   Consultants:  Gastroenterology  Procedures:  EGD 3/30 Impression:               - Erosive gastropathy with no stigmata of recent                            bleeding. Biopsied.                           -  Duodenitis. Biopsied.                           - A single non-bleeding angiodysplastic lesion in                            the duodenum. Treated with argon plasma coagulation                            (APC). Thought reasonable to ablate as at higher                            risk of bleeding given co-morbidities                           - The examination was otherwise normal.                           - STILL CANNOT SAY SHE DOES NOT HAVE PANCREATITIS -                            BUT THIS IS NEW INFO AND WILL TREAT AS BELOW -                             LOOKS LIKE SHE HAS BEEN ON PPI IN PAST BUT NOT NOW Recommendation:           - Clear liquid diet.                           - Return patient to hospital ward for ongoing care.                           - Use Protonix (pantoprazole) 40 mg PO daily.                           - Await pathology results. She will need f/u GI                            clinic and likely need an endoscopic ultrasound to                            look at the pancreas to see if she has changes of                            chronic pancreatitis - unfourtunately that may not                            change management  Antimicrobials: None     Objective: Vitals:   09/10/19 1430 09/10/19 1500 09/10/19 1530 09/10/19 1600  BP: (!) 183/84 (!) 187/95 (!) 188/105 (!) 173/85  Pulse: 73 78 78 67  Resp:      Temp:      TempSrc:      SpO2:      Weight:  Height:       No intake or output data in the 24 hours ending 09/10/19 1718 Filed Weights   09/09/19 1230 09/09/19 1505 09/10/19 1251  Weight: 65 kg 64.5 kg 63.8 kg    Examination:  Constitutional: NAD Eyes: no icterus  ENMT: mmm Neck: normal, supple Respiratory: CTA biL, no murmurs, no edema  Cardiovascular: RRR, no edema  Abdomen: soft, tender to palpation in the epigastric area Musculoskeletal: no clubbing / cyanosis.  Skin: no rashes Neurologic: non focal   Data Reviewed: I have independently reviewed following labs and imaging studies   CBC: Recent Labs  Lab 09/05/19 0912 09/06/19 1047 09/08/19 0618 09/10/19 1402  WBC 9.1 8.8 6.5 6.8  NEUTROABS 5.2  --   --   --   HGB 13.7 13.2 11.5* 11.4*  HCT 41.3 40.2 34.8* 34.8*  MCV 91.2 90.5 88.5 90.4  PLT 259 240 209 559   Basic Metabolic Panel: Recent Labs  Lab 09/05/19 0912 09/06/19 1047 09/07/19 1227 09/08/19 0618 09/10/19 1402  NA 137 137 136 137 135  K 5.3* 5.7* 4.5 4.6 4.7  CL 88* 91* 96* 94* 96*  CO2 31 25 25 29 25   GLUCOSE 111* 96 125* 93 129*  BUN 49*  60* 21 33* 34*  CREATININE 11.46* 13.83* 7.13* 9.16* 8.07*  CALCIUM 10.1 9.3 9.3 9.5 9.3  PHOS  --   --  5.3* 6.1* 4.8*   Liver Function Tests: Recent Labs  Lab 09/05/19 0912 09/06/19 1047 09/07/19 1227 09/08/19 0618 09/10/19 1402  AST 16 17  --   --   --   ALT 13 7  --   --   --   ALKPHOS 59 58  --   --   --   BILITOT 0.8 0.5  --   --   --   PROT 8.6* 7.6  --   --   --   ALBUMIN 3.9 3.5 3.1* 2.9* 2.9*   Coagulation Profile: No results for input(s): INR, PROTIME in the last 168 hours. HbA1C: No results for input(s): HGBA1C in the last 72 hours. CBG: No results for input(s): GLUCAP in the last 168 hours.  Recent Results (from the past 240 hour(s))  SARS CORONAVIRUS 2 (TAT 6-24 HRS) Nasopharyngeal Nasopharyngeal Swab     Status: None   Collection Time: 09/05/19  1:22 PM   Specimen: Nasopharyngeal Swab  Result Value Ref Range Status   SARS Coronavirus 2 NEGATIVE NEGATIVE Final    Comment: (NOTE) SARS-CoV-2 target nucleic acids are NOT DETECTED. The SARS-CoV-2 RNA is generally detectable in upper and lower respiratory specimens during the acute phase of infection. Negative results do not preclude SARS-CoV-2 infection, do not rule out co-infections with other pathogens, and should not be used as the sole basis for treatment or other patient management decisions. Negative results must be combined with clinical observations, patient history, and epidemiological information. The expected result is Negative. Fact Sheet for Patients: SugarRoll.be Fact Sheet for Healthcare Providers: https://www.woods-mathews.com/ This test is not yet approved or cleared by the [place] FDA and  has been authorized for detection and/or diagnosis of SARS-CoV-2 by FDA under an Emergency Use Authorization (EUA). This EUA will remain  in effect (meaning this test can be used) for the duration of the COVID-19 declaration under Section 56 4(b)(1) of the  Act, 21 U.S.C. section 360bbb-3(b)(1), unless the authorization is terminated or revoked sooner. Performed at [facility], [place] [address], [place], [place] [address]  MRSA PCR Screening     Status: None   Collection Time: 09/06/19  8:04 AM   Specimen: Nasal Mucosa; Nasopharyngeal  Result Value Ref Range Status   MRSA by PCR NEGATIVE NEGATIVE Final    Comment:        The GeneXpert MRSA Assay (FDA approved for NASAL specimens only), is one component of a comprehensive MRSA colonization surveillance program. It is not intended to diagnose MRSA infection nor to guide or monitor treatment for MRSA infections. Performed at [facility], [place] [address] [place], [place] [address]      Radiology Studies: No results found.   Marzetta Board, MD, PhD [facility]  Between 7 am - 7 pm I am available, please contact me via Amion or Securechat  Between 7 pm - 7 am I am not available, please contact night coverage MD/APP via Amion

## 2019-09-10 NOTE — Plan of Care (Signed)
  Problem: Education: Goal: Knowledge of General Education information will improve Description: Including pain rating scale, medication(s)/side effects and non-pharmacologic comfort measures Outcome: Progressing   Problem: Health Behavior/Discharge Planning: Goal: Ability to manage health-related needs will improve Outcome: Progressing   Problem: Clinical Measurements: Goal: Will remain free from infection Outcome: Progressing   Problem: Nutrition: Goal: Adequate nutrition will be maintained Outcome: Progressing   Problem: Coping: Goal: Level of anxiety will decrease Outcome: Progressing   Problem: Elimination: Goal: Will not experience complications related to urinary retention Outcome: Progressing   Problem: Pain Managment: Goal: General experience of comfort will improve Outcome: Progressing   Problem: Safety: Goal: Ability to remain free from injury will improve Outcome: Progressing   Problem: Skin Integrity: Goal: Risk for impaired skin integrity will decrease Outcome: Progressing

## 2019-09-11 ENCOUNTER — Encounter (HOSPITAL_COMMUNITY): Payer: Self-pay

## 2019-09-11 ENCOUNTER — Other Ambulatory Visit: Payer: Self-pay

## 2019-09-11 ENCOUNTER — Emergency Department (HOSPITAL_COMMUNITY): Payer: Medicare Other

## 2019-09-11 ENCOUNTER — Emergency Department (HOSPITAL_COMMUNITY)
Admission: EM | Admit: 2019-09-11 | Discharge: 2019-09-12 | Disposition: A | Discharge status: Home / Self Care | Payer: Medicare Other | Attending: Emergency Medicine | Admitting: Emergency Medicine

## 2019-09-11 DIAGNOSIS — Y9389 Activity, other specified: Secondary | ICD-10-CM | POA: Insufficient documentation

## 2019-09-11 DIAGNOSIS — Y92098 Other place in other non-institutional residence as the place of occurrence of the external cause: Secondary | ICD-10-CM | POA: Insufficient documentation

## 2019-09-11 DIAGNOSIS — Y999 Unspecified external cause status: Secondary | ICD-10-CM | POA: Insufficient documentation

## 2019-09-11 DIAGNOSIS — F17210 Nicotine dependence, cigarettes, uncomplicated: Secondary | ICD-10-CM | POA: Insufficient documentation

## 2019-09-11 DIAGNOSIS — M79641 Pain in right hand: Secondary | ICD-10-CM | POA: Insufficient documentation

## 2019-09-11 DIAGNOSIS — W108XXA Fall (on) (from) other stairs and steps, initial encounter: Secondary | ICD-10-CM | POA: Insufficient documentation

## 2019-09-11 DIAGNOSIS — S52591A Other fractures of lower end of right radius, initial encounter for closed fracture: Secondary | ICD-10-CM | POA: Diagnosis not present

## 2019-09-11 DIAGNOSIS — Z8673 Personal history of transient ischemic attack (TIA), and cerebral infarction without residual deficits: Secondary | ICD-10-CM | POA: Insufficient documentation

## 2019-09-11 DIAGNOSIS — M1990 Unspecified osteoarthritis, unspecified site: Secondary | ICD-10-CM | POA: Insufficient documentation

## 2019-09-11 MED ORDER — PANCRELIPASE (LIP-PROT-AMYL) 36000-114000 UNITS PO CPEP: 36000.0000 [IU] | ORAL_CAPSULE | Freq: Three times a day (TID) | ORAL | 1 refills | Status: DC

## 2019-09-11 MED ORDER — OXYCODONE HCL 5 MG PO TABS: 5.0000 mg | ORAL_TABLET | Freq: Four times a day (QID) | ORAL | 0 refills | Status: AC | PRN

## 2019-09-11 MED ORDER — OXYCODONE HCL 5 MG PO TABS: 5.0000 mg | ORAL_TABLET | Freq: Four times a day (QID) | ORAL | 0 refills | Status: DC | PRN

## 2019-09-11 MED ORDER — PANCRELIPASE (LIP-PROT-AMYL) 36000-114000 UNITS PO CPEP: 36000.0000 [IU] | ORAL_CAPSULE | Freq: Three times a day (TID) | ORAL | 1 refills | Status: AC

## 2019-09-11 MED ORDER — METOPROLOL SUCCINATE ER 25 MG PO TB24: 25.0000 mg | ORAL_TABLET | Freq: Every day | ORAL | 0 refills | Status: DC

## 2019-09-11 NOTE — ED Triage Notes (Signed)
Pt discharged from hospital today.  Went to daughters house lost balance and fell on right arm.  C/o right wrist/hand pain.

## 2019-09-11 NOTE — Discharge Summary (Signed)
Physician Discharge Summary  Ashley [name] UUV:[id number] DOB: 28-Mar-1951 DOA: 09/05/2019  PCP: Sonia Side., FNP  Admit date: 09/05/2019 Discharge date: 09/11/2019  Admitted From: home Disposition:  home  Recommendations for Outpatient Follow-up:  1. Follow up with PCP in 1-2 weeks 2. Follow up with Gastroenterology in 2 weeks  Home Health: none Equipment/Devices: none  Discharge Condition: stable CODE STATUS: Full code Diet recommendation: low fat  HPI: Per admitting MD, Ashley [name] is a 69 y.o. female with medical history significant of recurrent pancreatitis,GERD, perforated gastric ulcerwith surgical [phone], Hepatitis C(completed Epclusa Rx in Jan 2021),Hx of IVDA, ESRD on HD, HTN who presented to the ED with epigastric pain for 2 days. The nature of pain was same as when she had her previous episodes and she had such episode about 4-5 times since last July. She also had multiple loose diarrhea, without any changes of abd pain, feeling nausea and has not been eating or drinking since last night. No fever or chills. ED Course: Lipase 85, CT abd showed no acute findings.  Hospital Course / Discharge diagnoses:  Principal Problem Abdominal pain due to recurrent pancreatitis -Thought to be due to recurrent pancreatitis, gastroenterology consulted and followed patient while hospitalized.  She underwent an EGD on 3/30 which showed erosive gastropathy and duodenitis, also she had a single nonbleeding lesion in the duodenum status post APC.  Patient has had difficulties with diet advancement, she was initially advanced to solids however that caused increasing pain having to be downgraded back to liquids for a day.  GI suggested that she may benefit from a trial of pancreatic enzymes more for pain control given lack of chronic diarrhea/steatorrhea, she was started on Creon and that seems to have helped.  She was advanced to solid, tolerating that relatively well, pain is  controlled with oral regimen, she will be discharged home in stable condition with GI follow-up and on a short course of pain medications.  Plans are in place for patient to get an EUS as an outpatient.  Active Problems  ESRD -Nephrology following,  underwent hemodialysis as per schedule while hospitalized History of hep C -Completed treatment Essential hypertension -BP intermittently high, she was placed back on her home metoprolol which apparently was discontinued History of drug abuse -States that she no longer uses cocaine Tobacco use -Counseled for cessation, appears determined to quit  Discharge Instructions   Allergies as of 09/11/2019      Reactions   Aspirin Nausea And Vomiting   Stomach ache   Ibuprofen Nausea And Vomiting   Stomach ache      Medication List    TAKE these medications   acetaminophen 500 MG tablet Commonly known as: TYLENOL Take 1,000 mg by mouth every 6 (six) hours as needed for headache (pain).   amLODipine 10 MG tablet Commonly known as: NORVASC Take 1 tablet (10 mg total) by mouth daily. What changed: when to take this   Auryxia 1 GM 210 MG(Fe) tablet Generic drug: ferric citrate Take 210 mg by mouth 3 (three) times daily with meals.   cinacalcet 30 MG tablet Commonly known as: SENSIPAR Take 30 mg by mouth daily with breakfast.   cloNIDine 0.1 MG tablet Commonly known as: CATAPRES Take 0.1 mg by mouth 2 (two) times daily.   DIALYVITE 800 WITH ZINC 0.8 MG Tabs Take 1 tablet by mouth daily with breakfast.   feeding supplement (NEPRO CARB STEADY) Liqd Take 237 mLs by mouth 3 (three) times  daily as needed (Supplement). What changed:   when to take this  reasons to take this   lidocaine-prilocaine cream Commonly known as: EMLA Apply 1 application topically See admin instructions. Apply small amount to access site on Tuesday, Thursday, Saturday one hour before dialysis. Cover with occlusive dressing (saran wrap)    lipase/protease/amylase 36000 UNITS Cpep capsule Commonly known as: CREON Take 1 capsule (36,000 Units total) by mouth 3 (three) times daily before meals.   metoprolol succinate 25 MG 24 hr tablet Commonly known as: TOPROL-XL Take 1 tablet (25 mg total) by mouth daily.   oxyCODONE 5 MG immediate release tablet Commonly known as: Oxy IR/ROXICODONE Take 1-2 tablets (5-10 mg total) by mouth every 6 (six) hours as needed for up to 5 days for moderate pain.      Follow-up Information    Sonia Side., FNP. Schedule an appointment as soon as possible for a visit in 1 week(s).   Specialty: Family Medicine Contact information: [place] [address] [phone]           Consultations:  GI  Procedures/Studies: EGD 3/30 Impression: - Erosive gastropathy with no stigmata of recent  bleeding. Biopsied. - Duodenitis. Biopsied. - A single non-bleeding angiodysplastic lesion in  the duodenum. Treated with argon plasma coagulation  (APC). Thought reasonable to ablate as at higher  risk of bleeding given co-morbidities - The examination was otherwise normal. - STILL CANNOT SAY SHE DOES NOT HAVE PANCREATITIS -  BUT THIS IS NEW INFO AND WILL TREAT AS BELOW -  LOOKS LIKE SHE HAS BEEN ON PPI IN PAST BUT NOT NOW Recommendation: - Clear liquid diet. - Return patient to hospital ward for ongoing care. - Use Protonix (pantoprazole) 40 mg PO daily. - Await pathology results. She will need f/u GI  clinic and likely need an endoscopic ultrasound to  look at the  pancreas to see if she has changes of  chronic pancreatitis - unfourtunately that may not  change management  DG Abd Acute W/Chest  Result Date: 09/05/2019 CLINICAL DATA:  Abdominal pain and vomiting. EXAM: DG ABDOMEN ACUTE W/ 1V CHEST COMPARISON:  Chest x-ray 03/03/2009 FINDINGS: The upright chest x-ray is unremarkable. No acute pulmonary findings, pleural effusion or pulmonary lesions. The heart is normal in size. Stable tortuosity and calcification of the thoracic aorta. A left axillary stent is noted. Two views of the abdomen demonstrate an unremarkable bowel gas pattern. No findings for obstruction or perforation. The soft tissue shadows are maintained. No worrisome calcifications. The bony structures are intact. IMPRESSION: 1. No acute cardiopulmonary findings. 2. No plain film findings for an acute abdominal process. Electronically Signed   By: Marijo Sanes M.D.   On: 09/05/2019 09:42     Subjective: States that he did fairly well with dinner last night as well as breakfast, asking to go home  Discharge Exam: BP (!) 180/77 (BP Location: Right Arm)   Pulse 69   Temp 98.6 F (37 C) (Oral)   Resp 18   Ht 5\' 2"  (1.575 m)   Wt 62.7 kg   SpO2 97%   BMI 25.28 kg/m   General: Pt is alert, awake, not in acute distress Cardiovascular: RRR, S1/S2 +, no rubs, no gallops Respiratory: CTA bilaterally, no wheezing, no rhonchi Abdominal: Soft, persistent mild tenderness in the epigastric area, no guarding or rebound, bowel sounds positive Extremities: no edema, no cyanosis  The results of significant diagnostics from this hospitalization (including imaging, microbiology, ancillary and  laboratory) are listed below for reference.     Microbiology: Recent Results (from the past 240 hour(s))  SARS CORONAVIRUS 2 (TAT 6-24 HRS) Nasopharyngeal Nasopharyngeal Swab     Status: None   Collection Time: 09/05/19  1:22 PM   Specimen: Nasopharyngeal  Swab  Result Value Ref Range Status   SARS Coronavirus 2 NEGATIVE NEGATIVE Final    Comment: (NOTE) SARS-CoV-2 target nucleic acids are NOT DETECTED. The SARS-CoV-2 RNA is generally detectable in upper and lower respiratory specimens during the acute phase of infection. Negative results do not preclude SARS-CoV-2 infection, do not rule out co-infections with other pathogens, and should not be used as the sole basis for treatment or other patient management decisions. Negative results must be combined with clinical observations, patient history, and epidemiological information. The expected result is Negative. Fact Sheet for Patients: SugarRoll.be Fact Sheet for Healthcare Providers: https://www.woods-mathews.com/ This test is not yet approved or cleared by the [place] FDA and  has been authorized for detection and/or diagnosis of SARS-CoV-2 by FDA under an Emergency Use Authorization (EUA). This EUA will remain  in effect (meaning this test can be used) for the duration of the COVID-19 declaration under Section 56 4(b)(1) of the Act, 21 U.S.C. section 360bbb-3(b)(1), unless the authorization is terminated or revoked sooner. Performed at [facility], [place] [address], [place], [place] [address]   MRSA PCR Screening     Status: None   Collection Time: 09/06/19  8:04 AM   Specimen: Nasal Mucosa; Nasopharyngeal  Result Value Ref Range Status   MRSA by PCR NEGATIVE NEGATIVE Final    Comment:        The GeneXpert MRSA Assay (FDA approved for NASAL specimens only), is one component of a comprehensive MRSA colonization surveillance program. It is not intended to diagnose MRSA infection nor to guide or monitor treatment for MRSA infections. Performed at [facility], [place] [address], [place], [place] [address]      Labs: Basic Metabolic Panel: Recent Labs  Lab 09/05/19 0912 09/06/19 1047 09/07/19 1227 09/08/19 0618  09/10/19 1402  NA 137 137 136 137 135  K 5.3* 5.7* 4.5 4.6 4.7  CL 88* 91* 96* 94* 96*  CO2 31 25 25 29 25   GLUCOSE 111* 96 125* 93 129*  BUN 49* 60* 21 33* 34*  CREATININE 11.46* 13.83* 7.13* 9.16* 8.07*  CALCIUM 10.1 9.3 9.3 9.5 9.3  PHOS  --   --  5.3* 6.1* 4.8*   Liver Function Tests: Recent Labs  Lab 09/05/19 0912 09/06/19 1047 09/07/19 1227 09/08/19 0618 09/10/19 1402  AST 16 17  --   --   --   ALT 13 7  --   --   --   ALKPHOS 59 58  --   --   --   BILITOT 0.8 0.5  --   --   --   PROT 8.6* 7.6  --   --   --   ALBUMIN 3.9 3.5 3.1* 2.9* 2.9*   CBC: Recent Labs  Lab 09/05/19 0912 09/06/19 1047 09/08/19 0618 09/10/19 1402  WBC 9.1 8.8 6.5 6.8  NEUTROABS 5.2  --   --   --   HGB 13.7 13.2 11.5* 11.4*  HCT 41.3 40.2 34.8* 34.8*  MCV 91.2 90.5 88.5 90.4  PLT 259 240 209 192   CBG: No results for input(s): GLUCAP in the last 168 hours. Hgb A1c No results for input(s): HGBA1C in the last 72 hours.  Lipid Profile No results for input(s): CHOL, HDL, LDLCALC, TRIG, CHOLHDL, LDLDIRECT in the last 72 hours. Thyroid function studies No results for input(s): TSH, T4TOTAL, T3FREE, THYROIDAB in the last 72 hours.  Invalid input(s): FREET3 Urinalysis    Component Value Date/Time   COLORURINE YELLOW 12/31/2018 [place] 12/31/2018 1014   LABSPEC 1.008 12/31/2018 1014   PHURINE 9.0 (H) 12/31/2018 1014   GLUCOSEU NEGATIVE 12/31/2018 1014   HGBUR SMALL (A) 12/31/2018 1014   BILIRUBINUR NEGATIVE 12/31/2018 1014   BILIRUBINUR NEG 01/29/2016 1142   KETONESUR NEGATIVE 12/31/2018 1014   PROTEINUR >=300 (A) 12/31/2018 1014   UROBILINOGEN 0.2 01/29/2016 1142   UROBILINOGEN 0.2 03/21/2015 1649   NITRITE NEGATIVE 12/31/2018 1014   LEUKOCYTESUR NEGATIVE 12/31/2018 1014    FURTHER DISCHARGE INSTRUCTIONS:   Get Medicines reviewed and adjusted: Please take all your medications with you for your next visit with your Primary MD   Laboratory/radiological  data: Please request your Primary MD to go over all hospital tests and procedure/radiological results at the follow up, please ask your Primary MD to get all Hospital records sent to his/her office.   In some cases, they will be blood work, cultures and biopsy results pending at the time of your discharge. Please request that your primary care M.D. goes through all the records of your hospital data and follows up on these results.   Also Note the following: If you experience worsening of your admission symptoms, develop shortness of breath, life threatening emergency, suicidal or homicidal thoughts you must seek medical attention immediately by calling 911 or calling your MD immediately  if symptoms less severe.   You must read complete instructions/literature along with all the possible adverse reactions/side effects for all the Medicines you take and that have been prescribed to you. Take any new Medicines after you have completely understood and accpet all the possible adverse reactions/side effects.    Do not drive when taking Pain medications or sleeping medications (Benzodaizepines)   Do not take more than prescribed Pain, Sleep and Anxiety Medications. It is not advisable to combine anxiety,sleep and pain medications without talking with your primary care practitioner   Special Instructions: If you have smoked or chewed Tobacco  in the last 2 yrs please stop smoking, stop any regular Alcohol  and or any Recreational drug use.   Wear Seat belts while driving.   Please note: You were cared for by a hospitalist during your hospital stay. Once you are discharged, your primary care physician will handle any further medical issues. Please note that NO REFILLS for any discharge medications will be authorized once you are discharged, as it is imperative that you return to your primary care physician (or establish a relationship with a primary care physician if you do not have one) for your post  hospital discharge needs so that they can reassess your need for medications and monitor your lab values.  Time coordinating discharge: 35 minutes  SIGNED:  Marzetta Board, MD, PhD 09/11/2019, 8:58 AM

## 2019-09-11 NOTE — Discharge Instructions (Signed)
Follow with Ashley [name]., FNP in 5-7 days  Please get a complete blood count and chemistry panel checked by your Primary MD at your next visit, and again as instructed by your Primary MD. Please get your medications reviewed and adjusted by your Primary MD.  Please request your Primary MD to go over all Hospital Tests and Procedure/Radiological results at the follow up, please get all Hospital records sent to your Prim MD by signing hospital release before you go home.  In some cases, there will be blood work, cultures and biopsy results pending at the time of your discharge. Please request that your primary care M.D. goes through all the records of your hospital data and follows up on these results.  If you had Pneumonia of Lung problems at the Hospital: Please get a 2 view Chest X ray done in 6-8 weeks after hospital discharge or sooner if instructed by your Primary MD.  If you have Congestive Heart Failure: Please call your Cardiologist or Primary MD anytime you have any of the following symptoms:  1) 3 pound weight gain in 24 hours or 5 pounds in 1 week  2) shortness of breath, with or without a dry hacking cough  3) swelling in the hands, feet or stomach  4) if you have to sleep on extra pillows at night in order to breathe  Follow cardiac low salt diet and 1.5 lit/day fluid restriction.  If you have diabetes Accuchecks 4 times/day, Once in AM empty stomach and then before each meal. Log in all results and show them to your primary doctor at your next visit. If any glucose reading is under 80 or above 300 call your primary MD immediately.  If you have Seizure/Convulsions/Epilepsy: Please do not drive, operate heavy machinery, participate in activities at heights or participate in high speed sports until you have seen by Primary MD or a Neurologist and advised to do so again. Per [facility] statutes, patients with seizures are not allowed to drive until they have been  seizure-free for six months.  Use caution when using heavy equipment or power tools. Avoid working on ladders or at heights. Take showers instead of baths. Ensure the water temperature is not too high on the home water heater. Do not go swimming alone. Do not lock yourself in a room alone (i.e. bathroom). When caring for infants or small children, sit down when holding, feeding, or changing them to minimize risk of injury to the child in the event you have a seizure. Maintain good sleep hygiene. Avoid alcohol.   If you had Gastrointestinal Bleeding: Please ask your Primary MD to check a complete blood count within one week of discharge or at your next visit. Your endoscopic/colonoscopic biopsies that are pending at the time of discharge, will also need to followed by your Primary MD.  Get Medicines reviewed and adjusted. Please take all your medications with you for your next visit with your Primary MD  Please request your Primary MD to go over all hospital tests and procedure/radiological results at the follow up, please ask your Primary MD to get all Hospital records sent to his/her office.  If you experience worsening of your admission symptoms, develop shortness of breath, life threatening emergency, suicidal or homicidal thoughts you must seek medical attention immediately by calling 911 or calling your MD immediately  if symptoms less severe.  You must read complete instructions/literature along with all the possible adverse reactions/[name] effects for all the Medicines  you take and that have been prescribed to you. Take any new Medicines after you have completely understood and accpet all the possible adverse reactions/[name] effects.   Do not drive or operate heavy machinery when taking Pain medications.   Do not take more than prescribed Pain, Sleep and Anxiety Medications  Special Instructions: If you have smoked or chewed Tobacco  in the last 2 yrs please stop smoking, stop any regular  Alcohol  and or any Recreational drug use.  Wear Seat belts while driving.  Please note You were cared for by a hospitalist during your hospital stay. If you have any questions about your discharge medications or the care you received while you were in the hospital after you are discharged, you can call the unit and asked to speak with the hospitalist on call if the hospitalist that took care of you is not available. Once you are discharged, your primary care physician will handle any further medical issues. Please note that NO REFILLS for any discharge medications will be authorized once you are discharged, as it is imperative that you return to your primary care physician (or establish a relationship with a primary care physician if you do not have one) for your aftercare needs so that they can reassess your need for medications and monitor your lab values.  You can reach the hospitalist office at phone [phone] or fax [phone]   If you do not have a primary care physician, you can call [phone] for a physician referral.  Activity: As tolerated with Full fall precautions use walker/cane & assistance as needed    Diet: low fat  Disposition Home

## 2019-09-11 NOTE — Progress Notes (Signed)
[facility] Progress Note  Name: Ashley [name] MRN: [id number] DOB: Oct 27, 1950  Chief Complaint:  abd pain, n/v  Subjective:  Seen in room. No complaints. Tolerating diet. Says going home today. Reports prolonged bleeding from AVF yesterday on dialysis.    ------------ Background on consult:  Ashley [name] is a 69 y.o. female with a history of recurrent pancreatitis, end-stage renal disease on hemodialysis Tuesday Thursday Saturday, prior cocaine use, hepatitis C, GERD, and hypertension who presented to the hospital with epigastric pain.  She is being admitted with pancreatitis. She states has had a little dizziness with standing.  Hasn't been able to eat or drink much if any for the past couple of days due to abdominal pain.  States has had similar presentation before and is discouraged.  She states additional testing was planned but hasn't yet been completed.  Currently with nausea but no vomiting.  She denies any muscle cramping with HD.  Has been leaving at her EDW as below and last tx was Saturday, 3/27.     Intake/Output Summary (Last 24 hours) at 09/11/2019 1034 Last data filed at 09/10/2019 1630 Gross per 24 hour  Intake --  Output 1000 ml  Net -1000 ml    Vitals:  Vitals:   09/10/19 1700 09/10/19 1956 09/11/19 0424 09/11/19 0810  BP: (!) 161/83 133/68 (!) 160/58 (!) 180/77  Pulse: 95 77 63 69  Resp: 18   18  Temp: 99.2 F (37.3 C) 98.2 F (36.8 C) 98.5 F (36.9 C) 98.6 F (37 C)  TempSrc: Oral Oral Oral Oral  SpO2: 100% 100% 100% 97%  Weight:      Height:         Physical Exam:    General adult female in bed in no acute distress HEENT normocephalic atraumatic extraocular movements intact sclera anicteric Neck supple trachea midline Lungs clear to auscultation bilaterally normal work of breathing at rest  Heart S1S2 no rub Abdomen soft minimally tender nondistended Extremities no edema  Psych normal mood and affect Neuro - alert and oriented x3  provides hx and follows commands Access LUE AVF bruit and thrill    Medications reviewed  Labs:  BMP Latest Ref Rng & Units 09/10/2019 09/08/2019 09/07/2019  Glucose 70 - 99 mg/dL 129(H) 93 125(H)  BUN 8 - 23 mg/dL 34(H) 33(H) 21  Creatinine 0.44 - 1.00 mg/dL 8.07(H) 9.16(H) 7.13(H)  BUN/Creat Ratio 12 - 28 - - -  Sodium 135 - 145 mmol/L 135 137 136  Potassium 3.5 - 5.1 mmol/L 4.7 4.6 4.5  Chloride 98 - 111 mmol/L 96(L) 94(L) 96(L)  CO2 22 - 32 mmol/L 25 29 25   Calcium 8.9 - 10.3 mg/dL 9.3 9.5 9.3   EGD:  S/p EGD with erosive gastropathy and no stigmata of recent bleeding.  Single nonbleeding angiodysplastic lesion s/p APC.   Outpatient HD orders:  [facility]  TTS schedule  3.5 hours 350 BF, 800 DF 2K/2.25 calcium EDW 59.5 kg and has been getting to EDW; last tx 3/27 and left 59.4 kg hectorol 3 mcg each tx Not on ESA - last Hb 11.4 on 3/18 and got mircera 50 mcg Jan.21, 2021  Assessment/Plan:   # ESRD  - HD per TTS schedule.  Refused treatment on 4/1 and willing for short tx on 4/2. Back on schedule 4/3.  - Not to EDW yet. UF to EDW as tolerated. Prolonged bleeding from AVF 4/3. Has been an issue at outpatient center. Will refer  for outpatient fistulogram.   # Acute Pancreatitis, recurrent - Given patient's ESRD, would avoid morphine for pain control. (If IV agent needed dilaudid or fentanyl preferred).   - s/p EGD with GI and note plan for outpatient EUS  -Advancing diet as tolerated   # Nausea - Supportive care per primary team  # HTN  - follow on home regimen - intermittently missed multiple meds related to HD   # Anemia CKD  - no acute indication for ESA; last outpatient Hb 11.4 and 13.7 on admission  # Secondary hyperparathyroidism and metabolic bone disease  - Continue hectorol; on [place]; Appears off sensipar per outpt HD medication list but pt states she started it back two weeks ago (self-initiated after she states she finished her  hepatitis C regimen).  Have stopped her sensipar given her nausea and would follow for now and reassess outpatient   Lynnda Child, PA-C 09/11/2019 10:34 AM

## 2019-09-11 NOTE — Plan of Care (Signed)
Pt discharging to home. Discharge instructions explained to pt and pt verbalized understanding. Packed all personal belongings. No further questions or concerns voiced. Awaiting transportation.   Problem: Education: Goal: Knowledge of General Education information will improve Description: Including pain rating scale, medication(s)/side effects and non-pharmacologic comfort measures Outcome: Completed/Met   Problem: Health Behavior/Discharge Planning: Goal: Ability to manage health-related needs will improve Outcome: Completed/Met   Problem: Clinical Measurements: Goal: Ability to maintain clinical measurements within normal limits will improve Outcome: Completed/Met Goal: Will remain free from infection Outcome: Completed/Met Goal: Diagnostic test results will improve Outcome: Completed/Met Goal: Respiratory complications will improve Outcome: Completed/Met Goal: Cardiovascular complication will be avoided Outcome: Completed/Met   Problem: Activity: Goal: Risk for activity intolerance will decrease Outcome: Completed/Met   Problem: Nutrition: Goal: Adequate nutrition will be maintained Outcome: Completed/Met   Problem: Coping: Goal: Level of anxiety will decrease Outcome: Completed/Met   Problem: Elimination: Goal: Will not experience complications related to bowel motility Outcome: Completed/Met Goal: Will not experience complications related to urinary retention Outcome: Completed/Met   Problem: Pain Managment: Goal: General experience of comfort will improve Outcome: Completed/Met   Problem: Safety: Goal: Ability to remain free from injury will improve Outcome: Completed/Met   Problem: Skin Integrity: Goal: Risk for impaired skin integrity will decrease Outcome: Completed/Met

## 2019-09-12 ENCOUNTER — Telehealth: Payer: Self-pay | Admitting: Nephrology

## 2019-09-12 MED ORDER — ACETAMINOPHEN 500 MG PO TABS
1000.0000 mg | ORAL_TABLET | Freq: Once | ORAL | Status: DC
  Filled 2019-09-12: qty 2
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
## 2019-09-12 NOTE — Telephone Encounter (Signed)
Transition of care contact from inpatient facility  Date of discharge:04/ Date of contact:09/12/2019 Method: Phone Spoke to: Patient  Patient contacted to discuss transition of care from recent inpatient hospitalization. Patient was admitted to [facility] from 08/16/2019  to 09/11/2019  with discharge diagnosis of Recurrent Pancreatitis   Medication changes were reviewed.  Patient will follow up with his/her outpatient HD unit on: 09/13/2019  At [facility]   Other f/u needs include:FU BP and wt's at her kidney center with possible body wt loss with her GI illness , and  Apt with GI for her continued care for Recurrent Pancreatitis

## 2019-09-12 NOTE — Progress Notes (Signed)
Orthopedic Tech Progress Note Patient Details:  Ashley [name] 1950/11/14 [id number]  Ortho Devices Type of Ortho Device: Arm sling, Thumb spica splint Splint Material: Fiberglass Ortho Device/Splint Location: rue Ortho Device/Splint Interventions: Ordered, Application, Adjustment   Post Interventions Patient Tolerated: Well Instructions Provided: Care of device, Adjustment of device   Karolee Stamps 09/12/2019, 6:25 AM

## 2019-09-12 NOTE — Discharge Instructions (Addendum)
Please take scheduled Tylenol 1000mg  every 8 hours in addition to your Oxycodone for additional pain control.

## 2019-09-14 ENCOUNTER — Encounter (HOSPITAL_COMMUNITY): Payer: Self-pay | Admitting: Emergency Medicine

## 2019-09-14 ENCOUNTER — Other Ambulatory Visit: Payer: Self-pay

## 2019-09-14 ENCOUNTER — Emergency Department (HOSPITAL_COMMUNITY): Payer: Medicare Other

## 2019-09-14 ENCOUNTER — Emergency Department (HOSPITAL_COMMUNITY)
Admission: EM | Admit: 2019-09-14 | Discharge: 2019-09-14 | Disposition: A | Discharge status: Home / Self Care | Payer: Medicare Other | Attending: Emergency Medicine | Admitting: Emergency Medicine

## 2019-09-14 DIAGNOSIS — Z992 Dependence on renal dialysis: Secondary | ICD-10-CM | POA: Diagnosis not present

## 2019-09-14 DIAGNOSIS — N186 End stage renal disease: Secondary | ICD-10-CM | POA: Diagnosis not present

## 2019-09-14 DIAGNOSIS — B182 Chronic viral hepatitis C: Secondary | ICD-10-CM | POA: Insufficient documentation

## 2019-09-14 DIAGNOSIS — F17210 Nicotine dependence, cigarettes, uncomplicated: Secondary | ICD-10-CM | POA: Insufficient documentation

## 2019-09-14 DIAGNOSIS — I120 Hypertensive chronic kidney disease with stage 5 chronic kidney disease or end stage renal disease: Secondary | ICD-10-CM | POA: Insufficient documentation

## 2019-09-14 DIAGNOSIS — K5900 Constipation, unspecified: Secondary | ICD-10-CM | POA: Diagnosis present

## 2019-09-14 DIAGNOSIS — F1290 Cannabis use, unspecified, uncomplicated: Secondary | ICD-10-CM | POA: Insufficient documentation

## 2019-09-14 DIAGNOSIS — K5903 Drug induced constipation: Secondary | ICD-10-CM | POA: Diagnosis not present

## 2019-09-14 LAB — CBC WITH DIFFERENTIAL/PLATELET
Abs Immature Granulocytes: 0.04 10*3/uL (ref 0.00–0.07)
Basophils Absolute: 0 10*3/uL (ref 0.0–0.1)
Basophils Relative: 0 %
Eosinophils Absolute: 0.2 10*3/uL (ref 0.0–0.5)
Eosinophils Relative: 2 %
HCT: 30 % — ABNORMAL LOW (ref 36.0–46.0)
Hemoglobin: 9.8 g/dL — ABNORMAL LOW (ref 12.0–15.0)
Immature Granulocytes: 0 %
Lymphocytes Relative: 14 %
Lymphs Abs: 1.4 10*3/uL (ref 0.7–4.0)
MCH: 29.6 pg (ref 26.0–34.0)
MCHC: 32.7 g/dL (ref 30.0–36.0)
MCV: 90.6 fL (ref 80.0–100.0)
Monocytes Absolute: 0.9 10*3/uL (ref 0.1–1.0)
Monocytes Relative: 9 %
Neutro Abs: 7.3 10*3/uL (ref 1.7–7.7)
Neutrophils Relative %: 75 %
Platelets: 284 10*3/uL (ref 150–400)
RBC: 3.31 MIL/uL — ABNORMAL LOW (ref 3.87–5.11)
RDW: 17.7 % — ABNORMAL HIGH (ref 11.5–15.5)
WBC: 9.9 10*3/uL (ref 4.0–10.5)
nRBC: 0 % (ref 0.0–0.2)

## 2019-09-14 LAB — COMPREHENSIVE METABOLIC PANEL
ALT: 10 U/L (ref 0–44)
AST: 17 U/L (ref 15–41)
Albumin: 3.3 g/dL — ABNORMAL LOW (ref 3.5–5.0)
Alkaline Phosphatase: 45 U/L (ref 38–126)
Anion gap: 18 — ABNORMAL HIGH (ref 5–15)
BUN: 68 mg/dL — ABNORMAL HIGH (ref 8–23)
CO2: 25 mmol/L (ref 22–32)
Calcium: 9.5 mg/dL (ref 8.9–10.3)
Chloride: 91 mmol/L — ABNORMAL LOW (ref 98–111)
Creatinine, Ser: 12.01 mg/dL — ABNORMAL HIGH (ref 0.44–1.00)
GFR calc Af Amer: 3 mL/min — ABNORMAL LOW (ref >60)
GFR calc non Af Amer: 3 mL/min — ABNORMAL LOW (ref >60)
Glucose, Bld: 115 mg/dL — ABNORMAL HIGH (ref 70–99)
Potassium: 5.6 mmol/L — ABNORMAL HIGH (ref 3.5–5.1)
Sodium: 134 mmol/L — ABNORMAL LOW (ref 135–145)
Total Bilirubin: 0.6 mg/dL (ref 0.3–1.2)
Total Protein: 7 g/dL (ref 6.5–8.1)

## 2019-09-14 MED ORDER — FLEET ENEMA 7-19 GM/118ML RE ENEM
1.0000 | ENEMA | Freq: Once | RECTAL | Status: AC
  Administered 2019-09-14: 18:00:00 1 via RECTAL
  Filled 2019-09-14: qty 1

## 2019-09-14 MED ORDER — POLYETHYLENE GLYCOL 3350 17 G PO PACK: 17.0000 g | PACK | Freq: Two times a day (BID) | ORAL | 0 refills | Status: DC

## 2019-09-14 MED ORDER — BISACODYL 10 MG RE SUPP
10.0000 mg | Freq: Once | RECTAL | Status: AC
  Administered 2019-09-14: 10 mg via RECTAL
  Filled 2019-09-14: qty 1

## 2019-09-14 MED ORDER — OXYCODONE-ACETAMINOPHEN 5-325 MG PO TABS
1.0000 | ORAL_TABLET | Freq: Once | ORAL | Status: AC
  Administered 2019-09-14: 1 via ORAL
  Filled 2019-09-14: qty 1

## 2019-09-14 MED ORDER — SODIUM ZIRCONIUM CYCLOSILICATE 10 G PO PACK
10.0000 g | PACK | Freq: Once | ORAL | Status: AC
  Administered 2019-09-14: 10 g via ORAL
  Filled 2019-09-14: qty 1

## 2019-09-14 MED ORDER — LIDOCAINE HCL URETHRAL/MUCOSAL 2 % EX GEL
1.0000 "application " | Freq: Once | CUTANEOUS | Status: AC
  Administered 2019-09-14: 1 via TOPICAL
  Filled 2019-09-14: qty 20

## 2019-09-14 NOTE — ED Provider Notes (Signed)
  Face-to-face evaluation   History: Patient feels constipated and not able to move bowels, and has pain in her rectum, and is unable to have a bowel movement for several days.  She missed dialysis yesterday, but went as usual, 3 days prior.  Physical exam: Alert, calm, cooperative.  She is uncomfortable.  Abdomen soft and nontender to palpation.  She is lucid.  Medical screening examination/treatment/procedure(s) were conducted as a shared visit with non-physician practitioner(s) and myself.  I personally evaluated the patient during the encounter    Daleen Bo, MD 09/15/19 2150

## 2019-09-14 NOTE — ED Triage Notes (Signed)
Pt here from home for constipation and missed dialysis yesterday. Pt was here on Sunday for a fall, broke R hand, prescribed oxycodone and pt has been constipated and has not had a BM since. Pt c/o 10/10 rectal pain, aox4, vss
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
## 2019-09-14 NOTE — ED Notes (Signed)
Pt verbalized understanding of discharge instructions. Follow up care, prescriptions reviewed, pt had no further questions.

## 2019-09-14 NOTE — Discharge Instructions (Signed)
Take the MiraLAX twice a day until you start to have regular bowel movements. Make sure you are drinking water and increasing your fluid intake. Attend dialysis tomorrow as scheduled. Return to the ER if you start to have worsening abdominal pain, fever, blood in your stool.

## 2019-09-14 NOTE — ED Notes (Signed)
Pt successfully had a bowel movement after interventions. Pt states she feels much better.

## 2019-09-14 NOTE — ED Notes (Signed)
Pt transported to xray 

## 2019-10-03 ENCOUNTER — Encounter: Payer: Self-pay | Admitting: *Deleted

## 2019-10-07 ENCOUNTER — Ambulatory Visit: Payer: Medicare Other | Admitting: Physician Assistant

## 2019-11-11 ENCOUNTER — Emergency Department (HOSPITAL_COMMUNITY): Payer: Medicare Other

## 2019-11-11 ENCOUNTER — Encounter (HOSPITAL_COMMUNITY): Payer: Self-pay

## 2019-11-11 ENCOUNTER — Inpatient Hospital Stay (HOSPITAL_COMMUNITY)
Admission: EM | Admit: 2019-11-11 | Discharge: 2019-11-15 | DRG: 438 | Disposition: A | Discharge status: Home / Self Care | Payer: Medicare Other | Attending: Internal Medicine | Admitting: Internal Medicine

## 2019-11-11 ENCOUNTER — Other Ambulatory Visit: Payer: Self-pay

## 2019-11-11 DIAGNOSIS — I10 Essential (primary) hypertension: Secondary | ICD-10-CM | POA: Diagnosis present

## 2019-11-11 DIAGNOSIS — Z716 Tobacco abuse counseling: Secondary | ICD-10-CM

## 2019-11-11 DIAGNOSIS — Z20822 Contact with and (suspected) exposure to covid-19: Secondary | ICD-10-CM | POA: Diagnosis present

## 2019-11-11 DIAGNOSIS — K859 Acute pancreatitis without necrosis or infection, unspecified: Principal | ICD-10-CM | POA: Diagnosis present

## 2019-11-11 DIAGNOSIS — Z79899 Other long term (current) drug therapy: Secondary | ICD-10-CM

## 2019-11-11 DIAGNOSIS — I700 Atherosclerosis of aorta: Secondary | ICD-10-CM | POA: Diagnosis present

## 2019-11-11 DIAGNOSIS — Z992 Dependence on renal dialysis: Secondary | ICD-10-CM

## 2019-11-11 DIAGNOSIS — R197 Diarrhea, unspecified: Secondary | ICD-10-CM | POA: Diagnosis present

## 2019-11-11 DIAGNOSIS — K5900 Constipation, unspecified: Secondary | ICD-10-CM | POA: Diagnosis present

## 2019-11-11 DIAGNOSIS — Z886 Allergy status to analgesic agent status: Secondary | ICD-10-CM

## 2019-11-11 DIAGNOSIS — Z8249 Family history of ischemic heart disease and other diseases of the circulatory system: Secondary | ICD-10-CM

## 2019-11-11 DIAGNOSIS — B1920 Unspecified viral hepatitis C without hepatic coma: Secondary | ICD-10-CM | POA: Diagnosis present

## 2019-11-11 DIAGNOSIS — K2970 Gastritis, unspecified, without bleeding: Secondary | ICD-10-CM | POA: Diagnosis present

## 2019-11-11 DIAGNOSIS — N2581 Secondary hyperparathyroidism of renal origin: Secondary | ICD-10-CM | POA: Diagnosis present

## 2019-11-11 DIAGNOSIS — R1013 Epigastric pain: Secondary | ICD-10-CM

## 2019-11-11 DIAGNOSIS — K219 Gastro-esophageal reflux disease without esophagitis: Secondary | ICD-10-CM | POA: Diagnosis present

## 2019-11-11 DIAGNOSIS — I120 Hypertensive chronic kidney disease with stage 5 chronic kidney disease or end stage renal disease: Secondary | ICD-10-CM | POA: Diagnosis present

## 2019-11-11 DIAGNOSIS — E8889 Other specified metabolic disorders: Secondary | ICD-10-CM | POA: Diagnosis present

## 2019-11-11 DIAGNOSIS — Z83438 Family history of other disorder of lipoprotein metabolism and other lipidemia: Secondary | ICD-10-CM

## 2019-11-11 DIAGNOSIS — E871 Hypo-osmolality and hyponatremia: Secondary | ICD-10-CM | POA: Diagnosis present

## 2019-11-11 DIAGNOSIS — D631 Anemia in chronic kidney disease: Secondary | ICD-10-CM | POA: Diagnosis present

## 2019-11-11 DIAGNOSIS — N186 End stage renal disease: Secondary | ICD-10-CM | POA: Diagnosis present

## 2019-11-11 DIAGNOSIS — F17210 Nicotine dependence, cigarettes, uncomplicated: Secondary | ICD-10-CM | POA: Diagnosis present

## 2019-11-11 DIAGNOSIS — Z8673 Personal history of transient ischemic attack (TIA), and cerebral infarction without residual deficits: Secondary | ICD-10-CM

## 2019-11-11 DIAGNOSIS — E875 Hyperkalemia: Secondary | ICD-10-CM

## 2019-11-11 DIAGNOSIS — Z888 Allergy status to other drugs, medicaments and biological substances status: Secondary | ICD-10-CM

## 2019-11-11 DIAGNOSIS — K861 Other chronic pancreatitis: Secondary | ICD-10-CM | POA: Diagnosis present

## 2019-11-11 DIAGNOSIS — E785 Hyperlipidemia, unspecified: Secondary | ICD-10-CM | POA: Diagnosis present

## 2019-11-11 DIAGNOSIS — K8681 Exocrine pancreatic insufficiency: Secondary | ICD-10-CM | POA: Diagnosis present

## 2019-11-11 LAB — COMPREHENSIVE METABOLIC PANEL
ALT: 16 U/L (ref 0–44)
AST: 19 U/L (ref 15–41)
Albumin: 4.1 g/dL (ref 3.5–5.0)
Alkaline Phosphatase: 104 U/L (ref 38–126)
Anion gap: 16 — ABNORMAL HIGH (ref 5–15)
BUN: 56 mg/dL — ABNORMAL HIGH (ref 8–23)
CO2: 30 mmol/L (ref 22–32)
Calcium: 9.3 mg/dL (ref 8.9–10.3)
Chloride: 92 mmol/L — ABNORMAL LOW (ref 98–111)
Creatinine, Ser: 9.84 mg/dL — ABNORMAL HIGH (ref 0.44–1.00)
GFR calc Af Amer: 4 mL/min — ABNORMAL LOW (ref >60)
GFR calc non Af Amer: 4 mL/min — ABNORMAL LOW (ref >60)
Glucose, Bld: 101 mg/dL — ABNORMAL HIGH (ref 70–99)
Potassium: 5.6 mmol/L — ABNORMAL HIGH (ref 3.5–5.1)
Sodium: 138 mmol/L (ref 135–145)
Total Bilirubin: 0.8 mg/dL (ref 0.3–1.2)
Total Protein: 8.5 g/dL — ABNORMAL HIGH (ref 6.5–8.1)

## 2019-11-11 LAB — CBC
HCT: 41.4 % (ref 36.0–46.0)
Hemoglobin: 13.5 g/dL (ref 12.0–15.0)
MCH: 31.8 pg (ref 26.0–34.0)
MCHC: 32.6 g/dL (ref 30.0–36.0)
MCV: 97.6 fL (ref 80.0–100.0)
Platelets: 360 10*3/uL (ref 150–400)
RBC: 4.24 MIL/uL (ref 3.87–5.11)
RDW: 15.2 % (ref 11.5–15.5)
WBC: 10 10*3/uL (ref 4.0–10.5)
nRBC: 0 % (ref 0.0–0.2)

## 2019-11-11 LAB — LIPASE, BLOOD: Lipase: 110 U/L — ABNORMAL HIGH (ref 11–51)

## 2019-11-11 MED ORDER — ONDANSETRON HCL 4 MG/2ML IJ SOLN
4.0000 mg | Freq: Once | INTRAMUSCULAR | Status: AC
  Administered 2019-11-11: 4 mg via INTRAVENOUS
  Filled 2019-11-11: qty 2

## 2019-11-11 MED ORDER — FENTANYL CITRATE (PF) 100 MCG/2ML IJ SOLN
50.0000 ug | Freq: Once | INTRAMUSCULAR | Status: AC
  Administered 2019-11-11: 50 ug via INTRAVENOUS
  Filled 2019-11-11: qty 2

## 2019-11-11 MED ORDER — SODIUM CHLORIDE 0.9% FLUSH: 3.0000 mL | Freq: Once | INTRAVENOUS | Status: DC
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
## 2019-11-11 NOTE — ED Triage Notes (Addendum)
Patient is from home. Patient c/o upper abdominal pain. Patient reports a history of pancreatitis.  Patient stated in triage that she was having mid abdominal pain and not upper abdominal pain.

## 2019-11-12 ENCOUNTER — Encounter (HOSPITAL_COMMUNITY): Payer: Self-pay | Admitting: Internal Medicine

## 2019-11-12 DIAGNOSIS — K5900 Constipation, unspecified: Secondary | ICD-10-CM | POA: Diagnosis present

## 2019-11-12 DIAGNOSIS — K859 Acute pancreatitis without necrosis or infection, unspecified: Secondary | ICD-10-CM | POA: Diagnosis present

## 2019-11-12 DIAGNOSIS — B1920 Unspecified viral hepatitis C without hepatic coma: Secondary | ICD-10-CM | POA: Diagnosis present

## 2019-11-12 DIAGNOSIS — E785 Hyperlipidemia, unspecified: Secondary | ICD-10-CM | POA: Diagnosis present

## 2019-11-12 DIAGNOSIS — Z83438 Family history of other disorder of lipoprotein metabolism and other lipidemia: Secondary | ICD-10-CM | POA: Diagnosis not present

## 2019-11-12 DIAGNOSIS — Z716 Tobacco abuse counseling: Secondary | ICD-10-CM | POA: Diagnosis not present

## 2019-11-12 DIAGNOSIS — Z886 Allergy status to analgesic agent status: Secondary | ICD-10-CM | POA: Diagnosis not present

## 2019-11-12 DIAGNOSIS — K219 Gastro-esophageal reflux disease without esophagitis: Secondary | ICD-10-CM | POA: Diagnosis present

## 2019-11-12 DIAGNOSIS — I120 Hypertensive chronic kidney disease with stage 5 chronic kidney disease or end stage renal disease: Secondary | ICD-10-CM | POA: Diagnosis present

## 2019-11-12 DIAGNOSIS — I10 Essential (primary) hypertension: Secondary | ICD-10-CM | POA: Diagnosis not present

## 2019-11-12 DIAGNOSIS — Z8673 Personal history of transient ischemic attack (TIA), and cerebral infarction without residual deficits: Secondary | ICD-10-CM | POA: Diagnosis not present

## 2019-11-12 DIAGNOSIS — K8500 Idiopathic acute pancreatitis without necrosis or infection: Secondary | ICD-10-CM | POA: Diagnosis not present

## 2019-11-12 DIAGNOSIS — F17210 Nicotine dependence, cigarettes, uncomplicated: Secondary | ICD-10-CM | POA: Diagnosis present

## 2019-11-12 DIAGNOSIS — K8681 Exocrine pancreatic insufficiency: Secondary | ICD-10-CM | POA: Diagnosis present

## 2019-11-12 DIAGNOSIS — Z992 Dependence on renal dialysis: Secondary | ICD-10-CM | POA: Diagnosis not present

## 2019-11-12 DIAGNOSIS — K861 Other chronic pancreatitis: Secondary | ICD-10-CM | POA: Diagnosis present

## 2019-11-12 DIAGNOSIS — N2581 Secondary hyperparathyroidism of renal origin: Secondary | ICD-10-CM | POA: Diagnosis present

## 2019-11-12 DIAGNOSIS — Z20822 Contact with and (suspected) exposure to covid-19: Secondary | ICD-10-CM | POA: Diagnosis present

## 2019-11-12 DIAGNOSIS — R1013 Epigastric pain: Secondary | ICD-10-CM | POA: Diagnosis not present

## 2019-11-12 DIAGNOSIS — R197 Diarrhea, unspecified: Secondary | ICD-10-CM | POA: Diagnosis present

## 2019-11-12 DIAGNOSIS — I700 Atherosclerosis of aorta: Secondary | ICD-10-CM | POA: Diagnosis present

## 2019-11-12 DIAGNOSIS — E8889 Other specified metabolic disorders: Secondary | ICD-10-CM | POA: Diagnosis present

## 2019-11-12 DIAGNOSIS — K2970 Gastritis, unspecified, without bleeding: Secondary | ICD-10-CM | POA: Diagnosis present

## 2019-11-12 DIAGNOSIS — Z79899 Other long term (current) drug therapy: Secondary | ICD-10-CM | POA: Diagnosis not present

## 2019-11-12 DIAGNOSIS — E875 Hyperkalemia: Secondary | ICD-10-CM | POA: Diagnosis not present

## 2019-11-12 DIAGNOSIS — E871 Hypo-osmolality and hyponatremia: Secondary | ICD-10-CM | POA: Diagnosis present

## 2019-11-12 DIAGNOSIS — D631 Anemia in chronic kidney disease: Secondary | ICD-10-CM | POA: Diagnosis present

## 2019-11-12 DIAGNOSIS — Z8249 Family history of ischemic heart disease and other diseases of the circulatory system: Secondary | ICD-10-CM | POA: Diagnosis not present

## 2019-11-12 DIAGNOSIS — N186 End stage renal disease: Secondary | ICD-10-CM | POA: Diagnosis present

## 2019-11-12 LAB — HEPATIC FUNCTION PANEL
ALT: 15 U/L (ref 0–44)
AST: 18 U/L (ref 15–41)
Albumin: 3.3 g/dL — ABNORMAL LOW (ref 3.5–5.0)
Alkaline Phosphatase: 99 U/L (ref 38–126)
Bilirubin, Direct: <0.1 mg/dL (ref 0.0–0.2)
Total Bilirubin: 0.7 mg/dL (ref 0.3–1.2)
Total Protein: 7.5 g/dL (ref 6.5–8.1)

## 2019-11-12 LAB — BASIC METABOLIC PANEL
Anion gap: 13 (ref 5–15)
BUN: 53 mg/dL — ABNORMAL HIGH (ref 8–23)
CO2: 32 mmol/L (ref 22–32)
Calcium: 9.2 mg/dL (ref 8.9–10.3)
Chloride: 92 mmol/L — ABNORMAL LOW (ref 98–111)
Creatinine, Ser: 10.78 mg/dL — ABNORMAL HIGH (ref 0.44–1.00)
GFR calc Af Amer: 4 mL/min — ABNORMAL LOW (ref >60)
GFR calc non Af Amer: 3 mL/min — ABNORMAL LOW (ref >60)
Glucose, Bld: 99 mg/dL (ref 70–99)
Potassium: 5.4 mmol/L — ABNORMAL HIGH (ref 3.5–5.1)
Sodium: 137 mmol/L (ref 135–145)

## 2019-11-12 LAB — CBC
HCT: 36.7 % (ref 36.0–46.0)
Hemoglobin: 12 g/dL (ref 12.0–15.0)
MCH: 31.6 pg (ref 26.0–34.0)
MCHC: 32.7 g/dL (ref 30.0–36.0)
MCV: 96.6 fL (ref 80.0–100.0)
Platelets: 328 10*3/uL (ref 150–400)
RBC: 3.8 MIL/uL — ABNORMAL LOW (ref 3.87–5.11)
RDW: 14.9 % (ref 11.5–15.5)
WBC: 12 10*3/uL — ABNORMAL HIGH (ref 4.0–10.5)
nRBC: 0 % (ref 0.0–0.2)

## 2019-11-12 LAB — MRSA PCR SCREENING: MRSA by PCR: NEGATIVE

## 2019-11-12 LAB — TRIGLYCERIDES: Triglycerides: 105 mg/dL (ref <150)

## 2019-11-12 LAB — SARS CORONAVIRUS 2 BY RT PCR (HOSPITAL ORDER, PERFORMED IN ~~LOC~~ HOSPITAL LAB): SARS Coronavirus 2: NEGATIVE

## 2019-11-12 MED ORDER — MORPHINE SULFATE (PF) 2 MG/ML IV SOLN
1.0000 mg | INTRAVENOUS | Status: DC | PRN
  Administered 2019-11-12 – 2019-11-13 (×6): 1 mg via INTRAVENOUS
  Filled 2019-11-12 (×5): qty 1

## 2019-11-12 MED ORDER — NEPRO/CARBSTEADY PO LIQD: 237.0000 mL | Freq: Two times a day (BID) | ORAL | Status: DC | PRN

## 2019-11-12 MED ORDER — PANTOPRAZOLE SODIUM 40 MG IV SOLR
40.0000 mg | INTRAVENOUS | Status: DC
  Administered 2019-11-12 – 2019-11-13 (×2): 40 mg via INTRAVENOUS
  Filled 2019-11-12 (×2): qty 40

## 2019-11-12 MED ORDER — HEPARIN SODIUM (PORCINE) 5000 UNIT/ML IJ SOLN
5000.0000 [IU] | Freq: Three times a day (TID) | INTRAMUSCULAR | Status: DC
  Administered 2019-11-12 – 2019-11-15 (×9): 5000 [IU] via SUBCUTANEOUS
  Filled 2019-11-12 (×8): qty 1

## 2019-11-12 MED ORDER — HYDROMORPHONE HCL 1 MG/ML IJ SOLN
1.0000 mg | Freq: Once | INTRAMUSCULAR | Status: AC
  Administered 2019-11-12: 1 mg via INTRAVENOUS
  Filled 2019-11-12: qty 1

## 2019-11-12 MED ORDER — ONDANSETRON HCL 4 MG/2ML IJ SOLN: 4.0000 mg | Freq: Four times a day (QID) | INTRAMUSCULAR | Status: DC | PRN

## 2019-11-12 MED ORDER — FERRIC CITRATE 1 GM 210 MG(FE) PO TABS
210.0000 mg | ORAL_TABLET | Freq: Three times a day (TID) | ORAL | Status: DC
  Administered 2019-11-13 – 2019-11-15 (×6): 210 mg via ORAL
  Filled 2019-11-12 (×7): qty 1

## 2019-11-12 MED ORDER — MORPHINE SULFATE (PF) 2 MG/ML IV SOLN
INTRAVENOUS | Status: AC
  Filled 2019-11-12: qty 1

## 2019-11-12 MED ORDER — CINACALCET HCL 30 MG PO TABS
30.0000 mg | ORAL_TABLET | Freq: Every day | ORAL | Status: DC
  Administered 2019-11-12 – 2019-11-14 (×2): 30 mg via ORAL
  Filled 2019-11-12 (×2): qty 1

## 2019-11-12 MED ORDER — DOXERCALCIFEROL 4 MCG/2ML IV SOLN
INTRAVENOUS | Status: AC
  Filled 2019-11-12: qty 2

## 2019-11-12 MED ORDER — CHLORHEXIDINE GLUCONATE CLOTH 2 % EX PADS: 6.0000 | MEDICATED_PAD | Freq: Every day | CUTANEOUS | Status: DC

## 2019-11-12 MED ORDER — AMLODIPINE BESYLATE 10 MG PO TABS
10.0000 mg | ORAL_TABLET | Freq: Every day | ORAL | Status: DC
  Administered 2019-11-13 – 2019-11-14 (×2): 10 mg via ORAL
  Filled 2019-11-12 (×2): qty 1

## 2019-11-12 MED ORDER — CLONIDINE HCL 0.1 MG PO TABS
0.1000 mg | ORAL_TABLET | Freq: Two times a day (BID) | ORAL | Status: DC
  Administered 2019-11-12 – 2019-11-14 (×5): 0.1 mg via ORAL
  Filled 2019-11-12 (×5): qty 1

## 2019-11-12 MED ORDER — METOPROLOL SUCCINATE ER 25 MG PO TB24
25.0000 mg | ORAL_TABLET | Freq: Every day | ORAL | Status: DC
  Administered 2019-11-13: 25 mg via ORAL
  Filled 2019-11-12: qty 1

## 2019-11-12 MED ORDER — RENA-VITE PO TABS
1.0000 | ORAL_TABLET | Freq: Every day | ORAL | Status: DC
  Administered 2019-11-12 – 2019-11-14 (×3): 1 via ORAL
  Filled 2019-11-12 (×3): qty 1

## 2019-11-12 MED ORDER — DOXERCALCIFEROL 4 MCG/2ML IV SOLN
1.0000 ug | INTRAVENOUS | Status: DC
  Administered 2019-11-12: 1 ug via INTRAVENOUS
  Filled 2019-11-12 (×2): qty 2

## 2019-11-12 MED ORDER — ONDANSETRON HCL 4 MG PO TABS: 4.0000 mg | ORAL_TABLET | Freq: Four times a day (QID) | ORAL | Status: DC | PRN

## 2019-11-12 NOTE — Progress Notes (Addendum)
Subjective: Patient admitted this morning, see detailed H&P by Dr Hal Hope 69 year old female with history of ESRD on hemodialysis Tuesday Thursday Saturday, history of recurrent pancreatitis, unknown etiology was admitted in March/April 2021 2 months ago, came to the hospital with complaints of abdominal pain for past 1 week.  She also had 2 episodes of diarrhea.  During the admission in March 2021 she had EGD which showed bleeding lesion in the duodenum which was cauterized. In the ER today CT abdomen showed findings consistent with acute pancreatitis.  Lipase only 110.  Vitals:   11/12/19 0623 11/12/19 0956  BP: (!) 143/63 131/61  Pulse: 73 75  Resp: 20 18  Temp: 98.6 F (37 C) 98.8 F (37.1 C)  SpO2: 98% 99%      A/P  Acute on chronic pancreatitis-cause of pancreatitis not clear.  Patient had multiple episodes of pancreatitis, last episode was in March 2 months ago.  Patient was started on clear liquid diet, still complains of pain.  We will switch her to n.p.o. continue morphine 1 g IV every 4 hours as needed for pain.  ESRD on HD-nephrology has been informed for patient dialysis today.  History of GI bleed-EGD in March 2021 showed bleeding lesion in duodenum which was cauterized.  Continue PPI.  Hypertension-blood pressure stable, continue amlodipine, Toprol-XL, Catapres    Oswald Hillock [facility] [phone]

## 2019-11-12 NOTE — H&P (Signed)
History and Physical    Ashley [name] ZWC:[id number] DOB: [birth year] DOA: 11/11/2019  PCP: Sonia Side., FNP  Patient coming from: Home.  Chief Complaint: Abdominal pain.  HPI: Ashley [name] is a 69 y.o. female with history of ESRD on hemodialysis on Tuesday Thursday and Saturday with history of recurrent pancreatitis cause not clear was admitted in March/April 2021 2 months ago and history of hypertension presents to the ER with complaints of abdominal pain over the last 1 week pain is mostly in the epigastric area sharp stabbing in nature with some nausea had 2 episodes of diarrhea.  Patient was able to take her medications but the pain was worsening so patient came to the ER.  During the admission in March 2021 patient also had EGD during which patient was seen to have a bleeding lesion in the duodenum which was cauterized.  Patient also had issues with advancing diet at that time.  ED Course: In the ER patient had CT abdomen pelvis which was consistent with acute pancreatitis and lipase was around 110 with normal LFTs.  CBC was unremarkable EKG was showing normal sinus rhythm.  Covid test is pending.  Review of Systems: As per HPI, rest all negative.   Past Medical History:  Diagnosis Date  . Acute pancreatitis 2000   2000, 12/2018, 08/2019  . Arthritis   . Cervical radiculopathy 02/28/2011  . Cocaine substance abuse ([place]) 05/26/2013   positive UDS   . Duodenitis   . Erosive gastropathy   . ESRD on hemodialysis (HCC)    TTS  . GERD (gastroesophageal reflux disease)   . 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  genotype 2B.  Epclusa started early 04/2020.    Marland Kitchen Hiatal hernia   . Hyperlipidemia 2015  . Hypertension 2008  . Marijuana abuse 05/27/2003   positive UDS, family members smoke as well  . Pancreatitis   . 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 Schatzki's ring   . Stroke ([place]) 06/2017   MRI:MRI: small, subacute left internal capsule infarct.  Chronic microvascular  ischemic changes w parenchymal volume loss. Chronic white matter periventricular microhemorrhage, likely due to htn  . Ulcer 1990   gastric ulcer. Ruptured s/p emergency repair    Past Surgical History:  Procedure Laterality Date  . ABDOMINAL HYSTERECTOMY  1979  . AV FISTULA PLACEMENT Left 06/16/2017   Procedure: ARTERIOVENOUS (AV) FISTULA CREATION LEFT ARM;  Surgeon: Conrad Litchfield, MD;  Location: [place];  Service: Vascular;  Laterality: Left;  . BASCILIC VEIN TRANSPOSITION Left 10/02/2017   Procedure: BASILIC VEIN TRANSPOSITION SECOND STAGE LEFT ARM;  Surgeon: Rosetta Posner, MD;  Location: [place];  Service: Vascular;  Laterality: Left;  . BIOPSY  09/06/2019   Procedure: BIOPSY;  Surgeon: Gatha Mayer, MD;  Location: [facility] ENDOSCOPY;  Service: Endoscopy;;  . ESOPHAGOGASTRODUODENOSCOPY N/A 05/29/2013   Procedure: ESOPHAGOGASTRODUODENOSCOPY (EGD);  Surgeon: Jerene Bears, MD;  Location: [facility] ENDOSCOPY;  Service: Endoscopy;  Laterality: N/A;  . ESOPHAGOGASTRODUODENOSCOPY  05/2013   for epigastric pain.  Nonobstructing Schatzki ring at GEJ, mild gastropathy, nonbleeding AVMs in bulb and D2. 5 mm sessile polyp in bulb.  . ESOPHAGOGASTRODUODENOSCOPY (EGD) WITH PROPOFOL N/A 09/06/2019   Procedure: ESOPHAGOGASTRODUODENOSCOPY (EGD) WITH PROPOFOL;  Surgeon: Gatha Mayer, MD;  Location: [place];  Service: Endoscopy;  Laterality: N/A;  . EXCHANGE OF A DIALYSIS CATHETER Left 07/31/2017   Procedure: Removal  OF A  Right GroinTUNNELED  DIALYSIS CATHETER ,  Insertion of Left Femoral Dialysis Catheter.;  Surgeon: Rosetta Posner, MD;  [facility];  Service: Vascular;  Laterality: Left;  . HOT HEMOSTASIS N/A 09/06/2019   Procedure: HOT HEMOSTASIS (ARGON PLASMA COAGULATION/BICAP);  Surgeon: Gatha Mayer, MD;  Location: [facility] ENDOSCOPY;  Service: Endoscopy;  Laterality: N/A;  . INSERTION OF DIALYSIS CATHETER Right 06/16/2017   Procedure: INSERTION OF DIALYSIS CATHETER;  Surgeon: Conrad Ideal, MD;  Location: [place];   Service: Vascular;  Laterality: Right;  . IR AV DIALY SHUNT INTRO [place] W/PTA/IMG LEFT  06/21/2018  . REPAIR OF PERFORATED ULCER  1990   gastric ulcer     reports that she has been smoking cigarettes. She has a 10.00 pack-year smoking history. She has never used smokeless tobacco. She reports previous drug use. Drugs: Heroin, Marijuana, and Cocaine. She reports that she does not drink alcohol.  Allergies  Allergen Reactions  . Aspirin Nausea And Vomiting    Stomach ache  . Ibuprofen Nausea And Vomiting    Stomach ache    Family History  Problem Relation Age of Onset  . Hypertension Father   . Cancer Father   . Hyperlipidemia Father   . Seizures Sister   . Early death Daughter   . Kidney disease Daughter        end stage dialysis dependent     Prior to Admission medications   Medication Sig Start Date End Date Taking? Authorizing Provider  acetaminophen (TYLENOL) 500 MG tablet Take 1,000 mg by mouth every 6 (six) hours as needed for headache (pain).    [provider]  amLODipine (NORVASC) 10 MG tablet Take 1 tablet (10 mg total) by mouth daily. Patient taking differently: Take 10 mg by mouth daily at 12 noon.  11/11/16   Katheren Shams, DO  B Complex-C-Zn-Folic Acid (DIALYVITE 914 WITH ZINC) 0.8 MG TABS Take 1 tablet by mouth daily with breakfast.  07/07/19   [provider]  cinacalcet (SENSIPAR) 30 MG tablet Take 30 mg by mouth daily with breakfast.    [provider]  cloNIDine (CATAPRES) 0.1 MG tablet Take 0.1 mg by mouth 2 (two) times daily. 06/25/19   [provider]  ferric citrate (AURYXIA) 1 GM 210 MG(Fe) tablet Take 210 mg by mouth 3 (three) times daily with meals.    [provider]  lidocaine-prilocaine (EMLA) cream Apply 1 application topically See admin instructions. Apply small amount to access site on Tuesday, Thursday, Saturday one hour before dialysis. Cover with occlusive dressing (saran wrap) 08/25/19    [provider]  metoprolol succinate (TOPROL-XL) 25 MG 24 hr tablet Take 1 tablet (25 mg total) by mouth daily. 09/11/19   Caren Griffins, MD  Nutritional Supplements (FEEDING SUPPLEMENT, NEPRO CARB STEADY,) LIQD Take 237 mLs by mouth 3 (three) times daily as needed (Supplement). Patient taking differently: Take 237 mLs by mouth 2 (two) times daily as needed (meal supplement).  07/11/19   Black, Lezlie Octave, NP  polyethylene glycol (MIRALAX) 17 g packet Take 17 g by mouth 2 (two) times daily. 09/14/19   Delia Heady, PA-C    Physical Exam: Constitutional: Moderately built and nourished. Vitals:   11/12/19 0027 11/12/19 0030 11/12/19 0031 11/12/19 0127  BP:  132/85    Pulse: 82 78 84 78  Resp: 15  16 15   Temp:      TempSrc:      SpO2: 96% 100% 100% 94%  Weight:      Height:  Eyes: Anicteric no pallor. ENMT: No discharge from the ears eyes nose or mouth. Neck: No muscle.  No neck rigidity. Respiratory: No rhonchi or crepitations. Cardiovascular: S1-S2 heard. Abdomen: Soft nontender bowel sounds present. Musculoskeletal: No edema. Skin: No rash. Neurologic: Alert awake oriented to time place and person.  Moves all extremities. Psychiatric: Appears normal per normal affect.   Labs on Admission: I have personally reviewed following labs and imaging studies  CBC: Recent Labs  Lab 11/11/19 1640  WBC 10.0  HGB 13.5  HCT 41.4  MCV 97.6  PLT 962   Basic Metabolic Panel: Recent Labs  Lab 11/11/19 1640  NA 138  K 5.6*  CL 92*  CO2 30  GLUCOSE 101*  BUN 56*  CREATININE 9.84*  CALCIUM 9.3   GFR: Estimated Creatinine Clearance: 4.5 mL/min (A) (by C-G formula based on SCr of 9.84 mg/dL (H)). Liver Function Tests: Recent Labs  Lab 11/11/19 1640  AST 19  ALT 16  ALKPHOS 104  BILITOT 0.8  PROT 8.5*  ALBUMIN 4.1   Recent Labs  Lab 11/11/19 1640  LIPASE 110*   No results for input(s): AMMONIA in the last 168 hours. Coagulation Profile: No results for  input(s): INR, PROTIME in the last 168 hours. Cardiac Enzymes: No results for input(s): CKTOTAL, CKMB, CKMBINDEX, TROPONINI in the last 168 hours. BNP (last 3 results) No results for input(s): PROBNP in the last 8760 hours. HbA1C: No results for input(s): HGBA1C in the last 72 hours. CBG: No results for input(s): GLUCAP in the last 168 hours. Lipid Profile: No results for input(s): CHOL, HDL, LDLCALC, TRIG, CHOLHDL, LDLDIRECT in the last 72 hours. Thyroid Function Tests: No results for input(s): TSH, T4TOTAL, FREET4, T3FREE, THYROIDAB in the last 72 hours. Anemia Panel: No results for input(s): VITAMINB12, FOLATE, FERRITIN, TIBC, IRON, RETICCTPCT in the last 72 hours. Urine analysis:    Component Value Date/Time   COLORURINE YELLOW 12/31/2018 1014   APPEARANCEUR CLEAR 12/31/2018 1014   LABSPEC 1.008 12/31/2018 1014   PHURINE 9.0 (H) 12/31/2018 1014   GLUCOSEU NEGATIVE 12/31/2018 1014   HGBUR SMALL (A) 12/31/2018 1014   BILIRUBINUR NEGATIVE 12/31/2018 1014   BILIRUBINUR NEG 01/29/2016 1142   KETONESUR NEGATIVE 12/31/2018 1014   PROTEINUR >=300 (A) 12/31/2018 1014   UROBILINOGEN 0.2 01/29/2016 1142   UROBILINOGEN 0.2 03/21/2015 1649   NITRITE NEGATIVE 12/31/2018 1014   LEUKOCYTESUR NEGATIVE 12/31/2018 1014   Sepsis Labs: @LABRCNTIP (procalcitonin:4,lacticidven:4) )No results found for this or any previous visit (from the past 240 hour(s)).   Radiological Exams on Admission: CT ABDOMEN PELVIS WO CONTRAST  Result Date: 11/11/2019 CLINICAL DATA:  Mid abdominal pain.  History of pancreatitis. EXAM: CT ABDOMEN AND PELVIS WITHOUT CONTRAST TECHNIQUE: Multidetector CT imaging of the abdomen and pelvis was performed following the standard protocol without IV contrast. COMPARISON:  CT 07/09/2019 FINDINGS: Lower chest: No acute findings. Chronic mild subpleural reticulation in the lung bases. 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 Coronary artery calcifications. Hepatobiliary:  Unremarkable noncontrast appearance of the liver. Gallbladder is partially distended. No calcified gallstone. No biliary dilatation. Pancreas: Minor fat stranding about the proximal body of the pancreas. Inflammatory changes less pronounced than on prior CT. No pancreatic ductal dilatation. No acute peripancreatic collection. Spleen: Normal in size without focal abnormality. Small splenule inferiorly. Adrenals/Urinary Tract: No adrenal nodule. Bilateral renal parenchymal thinning without hydronephrosis or perinephric edema. No renal calculi. Urinary bladder is near completely empty and not well assessed. Stomach/Bowel: Unenhanced stomach is unremarkable. No evidence of duodenal  inflammation. Few fluid-filled small bowel loops in the lower abdomen and pelvis, nondilated and likely reactive. No obstruction. Normal appendix. Small volume of stool throughout the colon. No colonic wall thickening or inflammation. Vascular/Lymphatic: Aorto bi-iliac atherosclerosis, moderate to severe in degree. No abdominal aortic aneurysm. No adenopathy. Reproductive: Status post hysterectomy. No adnexal masses. Other: No ascites. No free air. No intra-abdominal fluid collection. Musculoskeletal: Similar degenerative change in the spine and pubic symphysis. There are no acute or suspicious osseous abnormalities. IMPRESSION: 1. Minor fat stranding about the proximal body of the pancreas, consistent with acute pancreatitis. Inflammatory changes less pronounced than on 07-12-2022 CT. No acute peripancreatic collection. 2.  Aortic Atherosclerosis (ICD10-I70.0). Aortic Atherosclerosis (ICD10-I70.0). Electronically Signed   By: Keith Rake M.D.   On: 11/11/2019 23:34    EKG: Independently reviewed.  Normal sinus rhythm.  Assessment/Plan Active Problems:   Hypertension   ESRD (end stage renal disease) on dialysis ([place])   Acute pancreatitis   Acute on chronic pancreatitis ([place])    1. Acute on chronic pancreatitis with the cause of  pancreatitis not clear.  Has had previous multiple episodes of pancreatitis as recently as March of this year 2 months ago.  We will keep patient on clear liquids and pain relief medications. 2. Hypertension on clonidine beta-blockers and amlodipine. 3. ESRD on hemodialysis on Tuesday Thursday and Saturday patient states she did not miss her dialysis. 4. History of GI bleed in March 2021 when EGD showed bleeding lesion in the duodenum we will keep patient on PPI. 5. Tobacco abuse advised about quitting. 6. Previous history of substance abuse patient states she has not taken any recently.  Given that patient has acute pancreatitis will need close monitoring for any further decrease in inpatient status.   DVT prophylaxis: Heparin. Code Status: Full code. Family Communication: Discussed with patient. Disposition Plan: Home. Consults called: None. Admission status: Inpatient.   Rise Patience MD [facility] Pager [phone].  If 7PM-7AM, please contact night-coverage [email or web address] Password [email or web address]  11/12/2019, 1:39 AM

## 2019-11-12 NOTE — Progress Notes (Signed)
New Admission Note:   Arrival Method:  stretcher Mental Orientation: alert x 4 Telemetry: # 7 Assessment: Completed Skin: intact IV: NSL Pain: none Tubes: none Safety Measures: Safety Fall Prevention Plan has been discussed Admission: Completed [facility] [place] Orientation: Patient has been orientated to the room, unit and staff.  Family: none at bedside  Orders have been reviewed and implemented. Will continue to monitor the patient. Call light has been placed within reach and bed alarm has been activated.   Rockie Neighbours BSN, RN Phone number: [phone]

## 2019-11-12 NOTE — Consult Note (Signed)
[facility] Renal Consultation Note    Indication for Consultation:  Management of ESRD/hemodialysis; anemia, hypertension/volume and secondary hyperparathyroidism PCP: Sonia Side., FNP   HPI: Ashley [name] is a 69 y.o. female with ESRD on TTS dialysis at [facility] with a history of HTN, hep C, polysubstance abuse and recurrent admission for acute pancreatitis (4th time this year). Patient presented yesterday with abdominal pain.  Work up in the ED included abdominal CT which showed acute pancreatitis, Lipase elevated at 110 and normal LFTs. WBC 10 K up 12 Ktoday  K 5.4 this am. She is admitted for pain control and conservative treatment with CL.  She lives alone at the old [facility] apartments. Friends take her for groceries as she doesn't drive any more. She smokes but denies etoh use.  Her appetite is poor overall. She denies fatty foods. She has nausea frequently and now. She has diarrhea when she gets pancreatitis, She has no SOB, CP, cough, fever or chills. She misses HD when she has abdominal pain. She also notes that one of the meds she received yesterday has given her a headache.  Past Medical History:  Diagnosis Date  . Acute pancreatitis 2000   2000, 12/2018, 08/2019  . Arthritis   . Cervical radiculopathy 02/28/2011  . Cocaine substance abuse ([place]) 05/26/2013   positive UDS   . Duodenitis   . Erosive gastropathy   . ESRD on hemodialysis (HCC)    TTS  . GERD (gastroesophageal reflux disease)   . 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  genotype 2B.  Epclusa started early 04/2020.    Marland Kitchen Hiatal hernia   . Hyperlipidemia 2015  . Hypertension 2008  . Marijuana abuse 05/27/2003   positive UDS, family members smoke as well  . Pancreatitis   . 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 Schatzki's ring   . Stroke ([place]) 06/2017   MRI:MRI: small, subacute left internal capsule infarct.  Chronic microvascular ischemic changes w parenchymal volume loss. Chronic white  matter periventricular microhemorrhage, likely due to htn  . Ulcer 1990   gastric ulcer. Ruptured s/p emergency repair   Past Surgical History:  Procedure Laterality Date  . ABDOMINAL HYSTERECTOMY  1979  . AV FISTULA PLACEMENT Left 06/16/2017   Procedure: ARTERIOVENOUS (AV) FISTULA CREATION LEFT ARM;  Surgeon: Conrad , MD;  Location: [place];  Service: Vascular;  Laterality: Left;  . BASCILIC VEIN TRANSPOSITION Left 10/02/2017   Procedure: BASILIC VEIN TRANSPOSITION SECOND STAGE LEFT ARM;  Surgeon: Rosetta Posner, MD;  Location: [place];  Service: Vascular;  Laterality: Left;  . BIOPSY  09/06/2019   Procedure: BIOPSY;  Surgeon: Gatha Mayer, MD;  Location: [facility] ENDOSCOPY;  Service: Endoscopy;;  . ESOPHAGOGASTRODUODENOSCOPY N/A 05/29/2013   Procedure: ESOPHAGOGASTRODUODENOSCOPY (EGD);  Surgeon: Jerene Bears, MD;  Location: [facility] ENDOSCOPY;  Service: Endoscopy;  Laterality: N/A;  . ESOPHAGOGASTRODUODENOSCOPY  05/2013   for epigastric pain.  Nonobstructing Schatzki ring at GEJ, mild gastropathy, nonbleeding AVMs in bulb and D2. 5 mm sessile polyp in bulb.  . ESOPHAGOGASTRODUODENOSCOPY (EGD) WITH PROPOFOL N/A 09/06/2019   Procedure: ESOPHAGOGASTRODUODENOSCOPY (EGD) WITH PROPOFOL;  Surgeon: Gatha Mayer, MD;  Location: [place];  Service: Endoscopy;  Laterality: N/A;  . EXCHANGE OF A DIALYSIS CATHETER Left 07/31/2017   Procedure: Removal  OF A  Right GroinTUNNELED  DIALYSIS CATHETER ,  Insertion of Left Femoral Dialysis Catheter.;  Surgeon: Rosetta Posner, MD;  Location: [place];  Service: Vascular;  Laterality: Left;  . HOT HEMOSTASIS N/A 09/06/2019   Procedure: HOT HEMOSTASIS (ARGON PLASMA COAGULATION/BICAP);  Surgeon: Gatha Mayer, MD;  Location: [facility] ENDOSCOPY;  Service: Endoscopy;  Laterality: N/A;  . INSERTION OF DIALYSIS CATHETER Right 06/16/2017   Procedure: INSERTION OF DIALYSIS CATHETER;  Surgeon: Conrad Neola, MD;  Location: [place];  Service: Vascular;  Laterality: Right;  . IR AV DIALY  SHUNT INTRO [place] W/PTA/IMG LEFT  06/21/2018  . REPAIR OF PERFORATED ULCER  1990   gastric ulcer   Family History  Problem Relation Age of Onset  . Hypertension Father   . Cancer Father   . Hyperlipidemia Father   . Seizures Sister   . Early death Daughter   . Kidney disease Daughter        end stage dialysis dependent    Social History:  reports that she has been smoking cigarettes. She has a 10.00 pack-year smoking history. She has never used smokeless tobacco. She reports previous drug use. Drugs: Heroin, Marijuana, and Cocaine. She reports that she does not drink alcohol. Allergies  Allergen Reactions  . Aspirin Nausea And Vomiting    Stomach ache  . Ibuprofen Nausea And Vomiting    Stomach ache   Prior to Admission medications   Medication Sig Start Date End Date Taking? Authorizing Provider  acetaminophen (TYLENOL) 500 MG tablet Take 1,000 mg by mouth every 6 (six) hours as needed for headache (pain).   Yes [provider]  amLODipine (NORVASC) 10 MG tablet Take 1 tablet (10 mg total) by mouth daily. Patient taking differently: Take 10 mg by mouth daily at 12 noon.  11/11/16  Yes Luiz Blare Y, DO  B Complex-C-Zn-Folic Acid (DIALYVITE 400 WITH ZINC) 0.8 MG TABS Take 1 tablet by mouth daily with breakfast.  07/07/19  Yes [provider]  cinacalcet (SENSIPAR) 30 MG tablet Take 30 mg by mouth at bedtime.    Yes [provider]  cloNIDine (CATAPRES) 0.1 MG tablet Take 0.1 mg by mouth 2 (two) times daily. 06/25/19  Yes [provider]  ferric citrate (AURYXIA) 1 GM 210 MG(Fe) tablet Take 210 mg by mouth 3 (three) times daily with meals.   Yes [provider]  lidocaine-prilocaine (EMLA) cream Apply 1 application topically See admin instructions. Apply small amount to access site on Tuesday, Thursday, Saturday one hour before dialysis. Cover with occlusive dressing (saran wrap) 08/25/19  Yes [provider]   Nutritional Supplements (FEEDING SUPPLEMENT, NEPRO CARB STEADY,) LIQD Take 237 mLs by mouth 3 (three) times daily as needed (Supplement). Patient taking differently: Take 237 mLs by mouth 2 (two) times daily as needed (meal supplement).  07/11/19  Yes Black, Lezlie Octave, NP  polyethylene glycol (MIRALAX) 17 g packet Take 17 g by mouth 2 (two) times daily. Patient taking differently: Take 17 g by mouth 2 (two) times daily as needed for mild constipation.  09/14/19  Yes Khatri, Hina, PA-C  metoprolol succinate (TOPROL-XL) 25 MG 24 hr tablet Take 1 tablet (25 mg total) by mouth daily. Patient not taking: Reported on 11/12/2019 09/11/19   Caren Griffins, MD   Current Facility-Administered Medications  Medication Dose Route Frequency Provider Last Rate Last Admin  . amLODipine (NORVASC) tablet 10 mg  10 mg Oral Daily Rise Patience, MD      . Chlorhexidine Gluconate Cloth 2 % PADS 6 each  6 each Topical Q0600 Alric Seton, PA-C      . cinacalcet [facility]) tablet 30 mg  30 mg Oral Q breakfast Rise Patience, MD   30 mg at 11/12/19 2979  . cloNIDine (CATAPRES) tablet 0.1 mg  0.1 mg Oral BID Rise Patience, MD      . feeding supplement (NEPRO CARB STEADY) liquid 237 mL  237 mL Oral BID PRN Rise Patience, MD      . ferric citrate (AURYXIA) tablet 210 mg  210 mg Oral TID WC Rise Patience, MD      . heparin injection 5,000 Units  5,000 Units Subcutaneous Q8H Rise Patience, MD   5,000 Units at 11/12/19 0255  . metoprolol succinate (TOPROL-XL) 24 hr tablet 25 mg  25 mg Oral Daily Rise Patience, MD      . morphine 2 MG/ML injection 1 mg  1 mg Intravenous Q4H PRN Rise Patience, MD   1 mg at 11/12/19 1054  . multivitamin (RENA-VIT) tablet 1 tablet  1 tablet Oral QHS Rise Patience, MD      . ondansetron [facility]) tablet 4 mg  4 mg Oral Q6H PRN Rise Patience, MD       Or  . ondansetron [facility]) injection 4 mg  4 mg Intravenous Q6H PRN Rise Patience, MD      . pantoprazole (PROTONIX) injection 40 mg  40 mg Intravenous Q24H Rise Patience, MD   40 mg at 11/12/19 8921  . sodium chloride flush (NS) 0.9 % injection 3 mL  3 mL Intravenous Once Rise Patience, MD       Labs: Basic Metabolic Panel: Recent Labs  Lab 11/11/19 1640 11/12/19 0632  NA 138 137  K 5.6* 5.4*  CL 92* 92*  CO2 30 32  GLUCOSE 101* 99  BUN 56* 53*  CREATININE 9.84* 10.78*  CALCIUM 9.3 9.2   Liver Function Tests: Recent Labs  Lab 11/11/19 1640 11/12/19 0632  AST 19 18  ALT 16 15  ALKPHOS 104 99  BILITOT 0.8 0.7  PROT 8.5* 7.5  ALBUMIN 4.1 3.3*   Recent Labs  Lab 11/11/19 1640  LIPASE 110*   No results for input(s): AMMONIA in the last 168 hours. CBC: Recent Labs  Lab 11/11/19 1640 11/12/19 0632  WBC 10.0 12.0*  HGB 13.5 12.0  HCT 41.4 36.7  MCV 97.6 96.6  PLT 360 328   Cardiac Enzymes: No results for input(s): CKTOTAL, CKMB, CKMBINDEX, TROPONINI in the last 168 hours. CBG: No results for input(s): GLUCAP in the last 168 hours. Iron Studies: No results for input(s): IRON, TIBC, TRANSFERRIN, FERRITIN in the last 72 hours. Studies/Results: CT ABDOMEN PELVIS WO CONTRAST  Result Date: 11/11/2019 CLINICAL DATA:  Mid abdominal pain.  History of pancreatitis. EXAM: CT ABDOMEN AND PELVIS WITHOUT CONTRAST TECHNIQUE: Multidetector CT imaging of the abdomen and pelvis was performed following the standard protocol without IV contrast. COMPARISON:  CT 07/09/2019 FINDINGS: Lower chest: No acute findings. Chronic mild subpleural reticulation in the lung bases. 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 Coronary artery calcifications. Hepatobiliary: Unremarkable noncontrast appearance of the liver. Gallbladder is partially distended. No calcified gallstone. No biliary dilatation. Pancreas: Minor fat stranding about the proximal body of the pancreas. Inflammatory changes less pronounced than on prior CT. No pancreatic ductal dilatation.  No acute peripancreatic collection. Spleen: Normal in size without focal abnormality. Small splenule inferiorly. Adrenals/Urinary Tract: No adrenal nodule. Bilateral renal parenchymal thinning without hydronephrosis or perinephric edema. No renal calculi. Urinary bladder is near completely empty and not well assessed. Stomach/Bowel: Unenhanced  stomach is unremarkable. No evidence of duodenal inflammation. Few fluid-filled small bowel loops in the lower abdomen and pelvis, nondilated and likely reactive. No obstruction. Normal appendix. Small volume of stool throughout the colon. No colonic wall thickening or inflammation. Vascular/Lymphatic: Aorto bi-iliac atherosclerosis, moderate to severe in degree. No abdominal aortic aneurysm. No adenopathy. Reproductive: Status post hysterectomy. No adnexal masses. Other: No ascites. No free air. No intra-abdominal fluid collection. Musculoskeletal: Similar degenerative change in the spine and pubic symphysis. There are no acute or suspicious osseous abnormalities. IMPRESSION: 1. Minor fat stranding about the proximal body of the pancreas, consistent with acute pancreatitis. Inflammatory changes less pronounced than on July 02, 2022 CT. No acute peripancreatic collection. 2.  Aortic Atherosclerosis (ICD10-I70.0). Aortic Atherosclerosis (ICD10-I70.0). Electronically Signed   By: Keith Rake M.D.   On: 11/11/2019 23:34    ROS: As per HPI otherwise negative.  Physical Exam: Vitals:   11/12/19 0127 11/12/19 0212 11/12/19 0623 11/12/19 0956  BP:  (!) 160/76 (!) 143/63 131/61  Pulse: 78 77 73 75  Resp: 15 16 20 18   Temp:  98.7 F (37.1 C) 98.6 F (37 C) 98.8 F (37.1 C)  TempSrc:  Oral Oral Oral  SpO2: 94% 100% 98% 99%  Weight:      Height:         General: slender elderly woman looks older than current age. Head: NCAT sclera not icteric MMM Neck: Supple.  Lungs: CTA bilaterally without wheezes, rales, or rhonchi. Breathing is unlabored. Heart: RRR with S1 S2.   Abdomen: soft mid abdominal tenderness + BS Lower extremities:without edema or ischemic changes, no open wounds  Neuro: A & O  X 3. Moves all extremities spontaneously. Psych:  Responds to questions appropriately with a normal affect. Dialysis Access: left upper AVF + bruit  Dialysis Orders:  TTS [facility] 3.5 hr #DW 59.5 - 2 K 2.25 Ca profile 4 350./800 left upper AVF hectorol 1 heparin 2000 plus 1000 mid tmt no Mircera  Recent labs: hgb 1.23 Ca P ok Recent missed tmt 5/27 and 5/31 -usually nmisses 1 or 2 per month  Assessment/Plan: 1. Recurrent pancreatitis- not clear what provokes - she needs to avoid fatty foods and abstain from etoch 2. ESRD -  TTS - HD today 3. Hypertension/volume  - UF to EDW, MTP, clonidine, amlodipine 4. Anemia  - hgb 12  no ESA needed 5. Metabolic bone disease -  Continue hecotorol/sensipar- hold aurixya until off CL deit 6. Nutrition - CL diet for now with multivits- d/c nepro for now high in fat 7. Hx GIB in March 2021 - has been on outpatient heparin with stable hgb 8. Tobacco abuse - on going -  9. Hx polysubstance abuse - claims no recent use   Myriam Jacobson, PA-C [place] [phone] 11/12/2019, 11:19 AM

## 2019-11-12 NOTE — Procedures (Signed)
   I was present at this dialysis session, have reviewed the session itself and made  appropriate changes Kelly Splinter MD [place] pager [phone]   11/12/2019, 5:08 PM

## 2019-11-12 NOTE — Progress Notes (Signed)
[facility] called for transport. 

## 2019-11-13 DIAGNOSIS — K2990 Gastroduodenitis, unspecified, without bleeding: Secondary | ICD-10-CM

## 2019-11-13 DIAGNOSIS — K2970 Gastritis, unspecified, without bleeding: Secondary | ICD-10-CM

## 2019-11-13 DIAGNOSIS — E875 Hyperkalemia: Secondary | ICD-10-CM

## 2019-11-13 DIAGNOSIS — K8500 Idiopathic acute pancreatitis without necrosis or infection: Secondary | ICD-10-CM

## 2019-11-13 LAB — COMPREHENSIVE METABOLIC PANEL
ALT: 9 U/L (ref 0–44)
AST: 20 U/L (ref 15–41)
Albumin: 3.3 g/dL — ABNORMAL LOW (ref 3.5–5.0)
Alkaline Phosphatase: 89 U/L (ref 38–126)
Anion gap: 15 (ref 5–15)
BUN: 17 mg/dL (ref 8–23)
CO2: 28 mmol/L (ref 22–32)
Calcium: 9.3 mg/dL (ref 8.9–10.3)
Chloride: 94 mmol/L — ABNORMAL LOW (ref 98–111)
Creatinine, Ser: 6.3 mg/dL — ABNORMAL HIGH (ref 0.44–1.00)
GFR calc Af Amer: 7 mL/min — ABNORMAL LOW (ref >60)
GFR calc non Af Amer: 6 mL/min — ABNORMAL LOW (ref >60)
Glucose, Bld: 83 mg/dL (ref 70–99)
Potassium: 5.5 mmol/L — ABNORMAL HIGH (ref 3.5–5.1)
Sodium: 137 mmol/L (ref 135–145)
Total Bilirubin: 0.5 mg/dL (ref 0.3–1.2)
Total Protein: 7 g/dL (ref 6.5–8.1)

## 2019-11-13 LAB — PROTIME-INR
INR: 1.1 (ref 0.8–1.2)
Prothrombin Time: 13.6 seconds (ref 11.4–15.2)

## 2019-11-13 LAB — LIPASE, BLOOD: Lipase: 76 U/L — ABNORMAL HIGH (ref 11–51)

## 2019-11-13 MED ORDER — HYDROCODONE-ACETAMINOPHEN 5-325 MG PO TABS
1.0000 | ORAL_TABLET | Freq: Four times a day (QID) | ORAL | Status: DC | PRN
  Administered 2019-11-13 – 2019-11-15 (×7): 1 via ORAL
  Filled 2019-11-13 (×7): qty 1

## 2019-11-13 MED ORDER — ACETAMINOPHEN 325 MG PO TABS: 650.0000 mg | ORAL_TABLET | Freq: Four times a day (QID) | ORAL | Status: DC | PRN

## 2019-11-13 MED ORDER — PANTOPRAZOLE SODIUM 40 MG PO TBEC: 40.0000 mg | DELAYED_RELEASE_TABLET | Freq: Every day | ORAL | Status: DC

## 2019-11-13 MED ORDER — DEXTROSE 5 % IV SOLN: INTRAVENOUS | Status: AC

## 2019-11-13 MED ORDER — SODIUM ZIRCONIUM CYCLOSILICATE 10 G PO PACK: 10.0000 g | PACK | Freq: Once | ORAL | Status: DC

## 2019-11-13 MED ORDER — POLYETHYLENE GLYCOL 3350 17 G PO PACK
17.0000 g | PACK | Freq: Every day | ORAL | Status: DC
  Administered 2019-11-13 – 2019-11-14 (×2): 17 g via ORAL
  Filled 2019-11-13 (×2): qty 1

## 2019-11-13 MED ORDER — SODIUM ZIRCONIUM CYCLOSILICATE 10 G PO PACK
10.0000 g | PACK | Freq: Two times a day (BID) | ORAL | Status: AC
  Administered 2019-11-13 (×2): 10 g via ORAL
  Filled 2019-11-13 (×2): qty 1

## 2019-11-13 MED ORDER — FAMOTIDINE 20 MG PO TABS: 20.0000 mg | ORAL_TABLET | Freq: Two times a day (BID) | ORAL | Status: DC

## 2019-11-13 MED ORDER — FAMOTIDINE 20 MG PO TABS
20.0000 mg | ORAL_TABLET | Freq: Every day | ORAL | Status: DC
  Administered 2019-11-13 – 2019-11-14 (×2): 20 mg via ORAL
  Filled 2019-11-13 (×2): qty 1

## 2019-11-13 NOTE — Progress Notes (Signed)
[facility] Progress Note   Dialysis Orders: TTS [place] 3.5 hr #DW 59.5 - 2 K 2.25 Ca profile 4 350./800 left upper AVF hectorol 1 heparin 2000 plus 1000 mid tmt no Mircera  Recent labs: hgb 1.23 Ca P ok Recent missed tmt 5/27 and 5/31 -usually nmisses 1 or 2 per month  Assessment/Plan: 1. Recurrent pancreatitis- not clear what provokes - she needs to avoid fatty foods - seen by GI - starting pepcid- advancing diet 2. ESRD -  TTS - next HD Tuesday  K 5.5 - unchanged but good kinetics as evidenced by decline in Cr - plan doses of lokelma today and repeat chemistries in am -  3. Hypertension/volume  - net UF 1 L - post wt not done but based on pre weight it would have been 59.1 , MTP, clonidine, amlodipine- would lower edw to 59 at d/c 4. Anemia  - hgb 12  no ESA needed 5. Metabolic bone disease -  Continue hecotorol/sensipar- hold aurixya until off CL deit 6. Nutrition - CL diet for now with multivits- d/c nepro for now high in fat 7. Hx GIB in March 2021 - has been on outpatient heparin with stable hgb 8. Tobacco abuse - on going -  9. Hx polysubstance abuse - claims no recent use   Myriam Jacobson, PA-C [place] [phone] 11/13/2019,9:50 AM  LOS: 1 day   Subjective:   No problems with dialysis yesterday. Wants to eat. Diet to be advanced Abdominal pain gone.   Objective Vitals:   11/12/19 1812 11/12/19 2105 11/13/19 0447 11/13/19 0900  BP: 138/85 (!) 150/80 132/89 130/84  Pulse: (!) 109 88 63 63  Resp: 20 17 18 18   Temp: 97.6 F (36.4 C) 98.8 F (37.1 C) 97.8 F (36.6 C) 98 F (36.7 C)  TempSrc:  Oral Oral Oral  SpO2: 97% 94% 97% 98%  Weight:  60.1 kg    Height:       Physical Exam General: NAD Heart: RRR Lungs: no rales Abdomen: soft NT no epigastric tenderness Extremities: no LE edema Dialysis Access: left upper AVF + bruit   Additional Objective Labs: Basic Metabolic Panel: Recent Labs  Lab 11/11/19 1640  11/12/19 0632 11/13/19 0709  NA 138 137 137  K 5.6* 5.4* 5.5*  CL 92* 92* 94*  CO2 30 32 28  GLUCOSE 101* 99 83  BUN 56* 53* 17  CREATININE 9.84* 10.78* 6.30*  CALCIUM 9.3 9.2 9.3   Liver Function Tests: Recent Labs  Lab 11/11/19 1640 11/12/19 0632 11/13/19 0709  AST 19 18 20   ALT 16 15 9   ALKPHOS 104 99 89  BILITOT 0.8 0.7 0.5  PROT 8.5* 7.5 7.0  ALBUMIN 4.1 3.3* 3.3*   Recent Labs  Lab 11/11/19 1640 11/13/19 0709  LIPASE 110* 76*   CBC: Recent Labs  Lab 11/11/19 1640 11/12/19 0632  WBC 10.0 12.0*  HGB 13.5 12.0  HCT 41.4 36.7  MCV 97.6 96.6  PLT 360 328   Blood Culture    Component Value Date/Time   SDES URINE, RANDOM 06/20/2017 2222   SPECREQUEST NONE 06/20/2017 2222   CULT MULTIPLE SPECIES PRESENT, SUGGEST RECOLLECTION (A) 06/20/2017 2222   REPTSTATUS 06/22/2017 FINAL 06/20/2017 2222    Cardiac Enzymes: No results for input(s): CKTOTAL, CKMB, CKMBINDEX, TROPONINI in the last 168 hours. CBG: No results for input(s): GLUCAP in the last 168 hours. Iron Studies: No results for input(s): IRON, TIBC, TRANSFERRIN, FERRITIN in the last 72 hours.  Lab Results  Component Value Date   INR 1.1 11/13/2019   INR 1.0 04/28/2018   INR 1.05 06/13/2017   Studies/Results: CT ABDOMEN PELVIS WO CONTRAST  Result Date: 11/11/2019 CLINICAL DATA:  Mid abdominal pain.  History of pancreatitis. EXAM: CT ABDOMEN AND PELVIS WITHOUT CONTRAST TECHNIQUE: Multidetector CT imaging of the abdomen and pelvis was performed following the standard protocol without IV contrast. COMPARISON:  CT 07/09/2019 FINDINGS: Lower chest: No acute findings. Chronic mild subpleural reticulation in the lung bases. 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 Coronary artery calcifications. Hepatobiliary: Unremarkable noncontrast appearance of the liver. Gallbladder is partially distended. No calcified gallstone. No biliary dilatation. Pancreas: Minor fat stranding about the proximal body of the  pancreas. Inflammatory changes less pronounced than on prior CT. No pancreatic ductal dilatation. No acute peripancreatic collection. Spleen: Normal in size without focal abnormality. Small splenule inferiorly. Adrenals/Urinary Tract: No adrenal nodule. Bilateral renal parenchymal thinning without hydronephrosis or perinephric edema. No renal calculi. Urinary bladder is near completely empty and not well assessed. Stomach/Bowel: Unenhanced stomach is unremarkable. No evidence of duodenal inflammation. Few fluid-filled small bowel loops in the lower abdomen and pelvis, nondilated and likely reactive. No obstruction. Normal appendix. Small volume of stool throughout the colon. No colonic wall thickening or inflammation. Vascular/Lymphatic: Aorto bi-iliac atherosclerosis, moderate to severe in degree. No abdominal aortic aneurysm. No adenopathy. Reproductive: Status post hysterectomy. No adnexal masses. Other: No ascites. No free air. No intra-abdominal fluid collection. Musculoskeletal: Similar degenerative change in the spine and pubic symphysis. There are no acute or suspicious osseous abnormalities. IMPRESSION: 1. Minor fat stranding about the proximal body of the pancreas, consistent with acute pancreatitis. Inflammatory changes less pronounced than on 06-Jul-2022 CT. No acute peripancreatic collection. 2.  Aortic Atherosclerosis (ICD10-I70.0). Aortic Atherosclerosis (ICD10-I70.0). Electronically Signed   By: Keith Rake M.D.   On: 11/11/2019 23:34   Medications:  . amLODipine  10 mg Oral Daily  . Chlorhexidine Gluconate Cloth  6 each Topical Q0600  . cinacalcet  30 mg Oral Q breakfast  . cloNIDine  0.1 mg Oral BID  . doxercalciferol  1 mcg Intravenous Q T,Th,Sa-HD  . famotidine  20 mg Oral Daily  . ferric citrate  210 mg Oral TID WC  . heparin  5,000 Units Subcutaneous Q8H  . metoprolol succinate  25 mg Oral Daily  . multivitamin  1 tablet Oral QHS  . polyethylene glycol  17 g Oral Daily  . sodium  chloride flush  3 mL Intravenous Once

## 2019-11-13 NOTE — Consult Note (Addendum)
Around longer                                                                                                                                                                         [facility] Consult: 8:14 AM 11/13/2019  LOS: 1 day    Referring Provider: Dr Candiss Norse  Primary Care Physician:  Ashley [name]., FNP Primary Gastroenterologist:  Dr. Hilarie Fredrickson.     Reason for Consultation:  Recurrent pancreatitis   HPI: Ashley [name] is a 69 y.o. female.  Hx ESRD, HD TTS.  Htn.  Anemia of CKD.  Constipation.  Required sedation for fecal disimpaction on 09/14/2019. Recurrent idiopathic pancreatitis starting dating back to 2000, generally mild episodes, Lipase runs 40s to 170s, no pseudocyst, necrosis or other compications.  MRI/MRCP 07/09/2019: Mild, acute pancreatitis without necrosis, pseudocyst or other complication.  No evidence of biliary ductal dilatation, no choledocholithiasis.  Hemosiderosis.  CBD 7 mm. No ETOH, IgG4 normal, trigs normal.  Lipase ranges of 40s to high 170s in 10/2018)   05/2013 EGD for nausea, epigastric pain.  Nonobstructing Schatzki's ring at [employer] junction.  Mild antral gastropathy, biopsied (mild, chronic, inactive atrophic gastritis with intestinal metaplasia, negative H. Pylori).  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  Sessile, 5 mm polyp (reactive/regenerative duodenal mucosa with gastric foveolar metaplasia.  Prominent, benign Brunner's glands) duodenal bulb removed.   At follow-up GI office visit 09/05/2019, Dr. Carlean Purl was not convinced pt  truly having recurrent pancreatitis given that lipase never reaches more than 2 times normal and pts w ESRD can run elevated lipase at baseline.  Therefore he scheduled patient for EGD.   09/06/2019 EGD.  For epigastric pain, elevated lipase, peri pancreatic inflammation on CT/MRI.  Revealed erosive gastropathy without stigmata of bleeding.  Duodenitis.  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  Was not taking PPI leading up to the study but started on this afterwards  Incidentally, at previous Cologuard testing, report said specimen could not be processed, reason not delineated.  Now admitted w CT comfirmed pancreatitis, Lipase 110.  LFTs normal.  Several days epigastric, stabbing, nonradiating pain.  No nausea or vomiting.  2 episodes diarrhea.   This is consistent with prior presentations and she says normally the symptoms resolve within about a week.  She stated home suffering with the pain thinking it might resolve on her own but after about a week of symptoms she presented to the ED.  Patient says she stopped taking the "blue and white capsule" that was prescribed after EGD because she felt it was making her stomach pain worse.  On research this capsule is omeprazole.   In between bouts of prolonged, acute epigastric  pain, she has no GI issues. She is able to eat and does not suffer from low level persistent abdominal pain. Re: Constipation.  It only becomes an issue if she is taking pain medication. If she uses MiraLAX, she can usually manage the constipation effectively.  CTAP w/o contrast: Minor fat stranding in the proximal body of pancreas consistent with acute pancreatitis.  Inflammation less pronounced than on CT of 26-Jul-2019.  No peripancreatic collection.  Aortic atherosclerosis.    Past Medical History:  Diagnosis Date  . Acute pancreatitis 2000   2000, 12/2018, 08/2019  . Arthritis   . Cervical radiculopathy 02/28/2011  . Cocaine substance abuse ([address]) 05/26/2013   positive UDS   . Duodenitis   . Erosive gastropathy   . ESRD on hemodialysis (HCC)    TTS  . GERD (gastroesophageal reflux disease)   . 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  genotype 2B.  Epclusa started early 04/2020.    Marland Kitchen Hiatal hernia   . Hyperlipidemia 2015  . Hypertension 2008  . Marijuana abuse 05/27/2003   positive UDS, family members smoke as well  . Pancreatitis   . Progressive focal  motor weakness 06/14/2017  . Schatzki's ring   . Stroke ([place]) Jul 25, 2017   MRI:MRI: small, subacute left internal capsule infarct.  Chronic microvascular ischemic changes w parenchymal volume loss. Chronic white matter periventricular microhemorrhage, likely due to htn  . Ulcer 1990   gastric ulcer. Ruptured s/p emergency repair    Past Surgical History:  Procedure Laterality Date  . ABDOMINAL HYSTERECTOMY  1979  . AV FISTULA PLACEMENT Left 06/16/2017   Procedure: ARTERIOVENOUS (AV) FISTULA CREATION LEFT ARM;  Surgeon: Conrad Jacksboro, MD;  Location: [place];  Service: Vascular;  Laterality: Left;  . BASCILIC VEIN TRANSPOSITION Left 10/02/2017   Procedure: BASILIC VEIN TRANSPOSITION SECOND STAGE LEFT ARM;  Surgeon: Rosetta Posner, MD;  Location: [place];  Service: Vascular;  Laterality: Left;  . BIOPSY  09/06/2019   Procedure: BIOPSY;  Surgeon: Gatha Mayer, MD;  Location: [facility] ENDOSCOPY;  Service: Endoscopy;;  . ESOPHAGOGASTRODUODENOSCOPY N/A 05/29/2013   Procedure: ESOPHAGOGASTRODUODENOSCOPY (EGD);  Surgeon: Jerene Bears, MD;  Location[facility] ENDOSCOPY;  Service: Endoscopy;  Laterality: N/A;  . ESOPHAGOGASTRODUODENOSCOPY  05/2013   for epigastric pain.  Nonobstructing Schatzki ring at GEJ, mild gastropathy, nonbleeding AVMs in bulb and D2. 5 mm sessile polyp in bulb.  . ESOPHAGOGASTRODUODENOSCOPY (EGD) WITH PROPOFOL N/A 09/06/2019   Procedure: ESOPHAGOGASTRODUODENOSCOPY (EGD) WITH PROPOFOL;  Surgeon: Gatha Mayer, MD;  Location: [place];  Service: Endoscopy;  Laterality: N/A;  . EXCHANGE OF A DIALYSIS CATHETER Left 07/31/2017   Procedure: Removal  OF A  Right GroinTUNNELED  DIALYSIS CATHETER ,  Insertion of Left Femoral Dialysis Catheter.;  Surgeon: Rosetta Posner, MD;  Location: [place];  Service: Vascular;  Laterality: Left;  . HOT HEMOSTASIS N/A 09/06/2019   Procedure: HOT HEMOSTASIS (ARGON PLASMA COAGULATION/BICAP);  Surgeon: Gatha Mayer, MD;  Location: [facility] ENDOSCOPY;  Service: Endoscopy;   Laterality: N/A;  . INSERTION OF DIALYSIS CATHETER Right 06/16/2017   Procedure: INSERTION OF DIALYSIS CATHETER;  Surgeon: Conrad Independence, MD;  Location: [place];  Service: Vascular;  Laterality: Right;  . IR AV DIALY SHUNT INTRO [place] W/PTA/IMG LEFT  06/21/2018  . REPAIR OF PERFORATED ULCER  1990   gastric ulcer    Prior to Admission medications   Medication Sig Start Date End Date Taking? Authorizing Provider  acetaminophen (TYLENOL) 500 MG tablet  Take 1,000 mg by mouth every 6 (six) hours as needed for headache (pain).   Yes [provider]  amLODipine (NORVASC) 10 MG tablet Take 1 tablet (10 mg total) by mouth daily. Patient taking differently: Take 10 mg by mouth daily at 12 noon.  11/11/16  Yes Luiz Blare Y, DO  B Complex-C-Zn-Folic Acid (DIALYVITE 672 WITH ZINC) 0.8 MG TABS Take 1 tablet by mouth daily with breakfast.  07/07/19  Yes [provider]  cinacalcet (SENSIPAR) 30 MG tablet Take 30 mg by mouth at bedtime.    Yes [provider]  cloNIDine (CATAPRES) 0.1 MG tablet Take 0.1 mg by mouth 2 (two) times daily. 06/25/19  Yes [provider]  ferric citrate (AURYXIA) 1 GM 210 MG(Fe) tablet Take 210 mg by mouth 3 (three) times daily with meals.   Yes [provider]  lidocaine-prilocaine (EMLA) cream Apply 1 application topically See admin instructions. Apply small amount to access site on Tuesday, Thursday, Saturday one hour before dialysis. Cover with occlusive dressing (saran wrap) 08/25/19  Yes [provider]  Nutritional Supplements (FEEDING SUPPLEMENT, NEPRO CARB STEADY,) LIQD Take 237 mLs by mouth 3 (three) times daily as needed (Supplement). Patient taking differently: Take 237 mLs by mouth 2 (two) times daily as needed (meal supplement).  07/11/19  Yes Black, Lezlie Octave, NP  polyethylene glycol (MIRALAX) 17 g packet Take 17 g by mouth 2 (two) times daily. Patient taking differently: Take 17 g by mouth 2 (two) times  daily as needed for mild constipation.  09/14/19  Yes Khatri, Hina, PA-C  metoprolol succinate (TOPROL-XL) 25 MG 24 hr tablet Take 1 tablet (25 mg total) by mouth daily. Patient not taking: Reported on 11/12/2019 09/11/19   Caren Griffins, MD    Scheduled Meds: . amLODipine  10 mg Oral Daily  . Chlorhexidine Gluconate Cloth  6 each Topical Q0600  . cinacalcet  30 mg Oral Q breakfast  . cloNIDine  0.1 mg Oral BID  . doxercalciferol  1 mcg Intravenous Q T,Th,Sa-HD  . ferric citrate  210 mg Oral TID WC  . heparin  5,000 Units Subcutaneous Q8H  . metoprolol succinate  25 mg Oral Daily  . multivitamin  1 tablet Oral QHS  . pantoprazole (PROTONIX) IV  40 mg Intravenous Q24H  . sodium chloride flush  3 mL Intravenous Once   Infusions:  PRN Meds: acetaminophen, HYDROcodone-acetaminophen, [DISCONTINUED] ondansetron **OR** ondansetron (ZOFRAN) IV   Allergies as of 11/11/2019 - Review Complete 11/11/2019  Allergen Reaction Noted  . Aspirin Nausea And Vomiting 07/18/2009  . Ibuprofen Nausea And Vomiting 12/24/2009    Family History  Problem Relation Age of Onset  . Hypertension Father   . Cancer Father   . Hyperlipidemia Father   . Seizures Sister   . Early death Daughter   . Kidney disease Daughter        end stage dialysis dependent     Social History   Socioeconomic History  . Marital status: Widowed    Spouse name: Not on file  . Number of children: 3  . Years of education: Not on file  . Highest education level: Not on file  Occupational History  . Occupation: retired  Tobacco Use  . Smoking status: Current Every Day Smoker    Packs/day: 0.25    Years: 40.00    Pack years: 10.00    Types: Cigarettes  . Smokeless tobacco: Never Used  Substance and Sexual Activity  . Alcohol use: No  Alcohol/week: 0.0 standard drinks  . Drug use: Not Currently    Types: Heroin, Marijuana, Cocaine    Comment: hasn't used cocaine in 1-2 years; she smokes marijuana daily, "whenever I  can get it"  . Sexual activity: Never  Other Topics Concern  . Not on file  Social History Narrative  . Not on file   Social Determinants of Health   Financial Resource Strain:   . Difficulty of Paying Living Expenses:   Food Insecurity:   . Worried About Charity fundraiser in the Last Year:   . Arboriculturist in the Last Year:   Transportation Needs:   . Film/video editor (Medical):   Marland Kitchen Lack of Transportation (Non-Medical):   Physical Activity:   . Days of Exercise per Week:   . Minutes of Exercise per Session:   Stress:   . Feeling of Stress :   Social Connections:   . Frequency of Communication with Friends and Family:   . Frequency of Social Gatherings with Friends and Family:   . Attends Religious Services:   . Active Member of Clubs or Organizations:   . Attends Archivist Meetings:   Marland Kitchen Marital Status:   Intimate Partner Violence:   . Fear of Current or Ex-Partner:   . Emotionally Abused:   Marland Kitchen Physically Abused:   . Sexually Abused:     REVIEW OF SYSTEMS: Constitutional: No weakness, no fatigue. ENT:  No nose bleeds Pulm: No shortness of breath or cough. CV:  No palpitations, no LE edema.  GU:  No hematuria, no frequency GI: See HPI Heme: No unusual or excessive bleeding or bruising. Neuro:  No headaches, no peripheral tingling or numbness Derm:  No itching, no rash or sores.  Endocrine:  No sweats or chills.  No polyuria or dysuria Immunization: Has received Covid vaccination series x2 Travel:  None beyond local counties in last few months.    PHYSICAL EXAM: Vital signs in last 24 hours: Vitals:   11/12/19 2105 11/13/19 0447  BP: (!) 150/80 132/89  Pulse: 88 63  Resp: 17 18  Temp: 98.8 F (37.1 C) 97.8 F (36.6 C)  SpO2: 94% 97%   Wt Readings from Last 3 Encounters:  11/12/19 60.1 kg  09/11/19 63.5 kg  09/10/19 62.7 kg    General: Pleasant, somewhat chronically ill appearing.  Alert, provides a good history.  Currently  comfortable Head: No facial asymmetry or swelling Eyes: No scleral icterus, no conjunctival pallor. Ears: Not hard of hearing Nose: No discharge or congestion Mouth: Oral mucosa moist, pink, clear.  Tongue midline. Neck: No JVD, no masses, no thyromegaly. Lungs: Clear bilaterally with good breath sounds.  No labored breathing or cough. Heart: RRR.  No MRG.  S1, S2 present. Abdomen: Soft.  Active bowel sounds.  No distention.  Tenderness is focal in the epigastric region but no guarding or rebound.  No HSM, masses, bruits, hernias..   Rectal: Deferred Musc/Skeltl: No gross joint swelling or contracture deformities. Extremities: No CCE. Neurologic: Oriented x3.  Good historian.  Moves all 4 limbs.  No tremors or gross deficits/weakness though limb strength was not tested. Skin: No sores, rashes or suspicious lesions. Nodes: No cervical adenopathy Psych: Calm, fluid speech.  Cooperative.  Pleasant affect.  Intake/Output from previous day: 06/05 0701 - 06/06 0700 In: 720 [P.O.:720] Out: 1000  Intake/Output this shift: No intake/output data recorded.  LAB RESULTS: Recent Labs    11/11/19 1640 11/12/19 0632  WBC 10.0  12.0*  HGB 13.5 12.0  HCT 41.4 36.7  PLT 360 328   BMET Lab Results  Component Value Date   NA 137 11/12/2019   NA 138 11/11/2019   NA 134 (L) 09/14/2019   K 5.4 (H) 11/12/2019   K 5.6 (H) 11/11/2019   K 5.6 (H) 09/14/2019   CL 92 (L) 11/12/2019   CL 92 (L) 11/11/2019   CL 91 (L) 09/14/2019   CO2 32 11/12/2019   CO2 30 11/11/2019   CO2 25 09/14/2019   GLUCOSE 99 11/12/2019   GLUCOSE 101 (H) 11/11/2019   GLUCOSE 115 (H) 09/14/2019   BUN 53 (H) 11/12/2019   BUN 56 (H) 11/11/2019   BUN 68 (H) 09/14/2019   CREATININE 10.78 (H) 11/12/2019   CREATININE 9.84 (H) 11/11/2019   CREATININE 12.01 (H) 09/14/2019   CALCIUM 9.2 11/12/2019   CALCIUM 9.3 11/11/2019   CALCIUM 9.5 09/14/2019   LFT Recent Labs    11/11/19 1640 11/12/19 0632  PROT 8.5* 7.5   ALBUMIN 4.1 3.3*  AST 19 18  ALT 16 15  ALKPHOS 104 99  BILITOT 0.8 0.7  BILIDIR  --  <0.1  IBILI  --  NOT CALCULATED   PT/INR Lab Results  Component Value Date   INR 1.0 04/28/2018   INR 1.05 06/13/2017   Hepatitis Panel No results for input(s): HEPBSAG, HCVAB, HEPAIGM, HEPBIGM in the last 72 hours. C-Diff No components found for: CDIFF Lipase     Component Value Date/Time   LIPASE 110 (H) 11/11/2019 1640    Drugs of Abuse     Component Value Date/Time   LABOPIA POSITIVE (A) 09/06/2019 2340   COCAINSCRNUR NONE DETECTED 09/06/2019 2340   COCAINSCRNUR NEG 11/28/2013 1704   LABBENZ NONE DETECTED 09/06/2019 2340   LABBENZ NEG 11/28/2013 1704   AMPHETMU NONE DETECTED 09/06/2019 2340   THCU NONE DETECTED 09/06/2019 2340   LABBARB NONE DETECTED 09/06/2019 2340     RADIOLOGY STUDIES: CT ABDOMEN PELVIS WO CONTRAST  Result Date: 11/11/2019 CLINICAL DATA:  Mid abdominal pain.  History of pancreatitis. EXAM: CT ABDOMEN AND PELVIS WITHOUT CONTRAST TECHNIQUE: Multidetector CT imaging of the abdomen and pelvis was performed following the standard protocol without IV contrast. COMPARISON:  CT 07/09/2019 FINDINGS: Lower chest: No acute findings. Chronic mild subpleural reticulation in the lung bases. 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 Coronary artery calcifications. Hepatobiliary: Unremarkable noncontrast appearance of the liver. Gallbladder is partially distended. No calcified gallstone. No biliary dilatation. Pancreas: Minor fat stranding about the proximal body of the pancreas. Inflammatory changes less pronounced than on prior CT. No pancreatic ductal dilatation. No acute peripancreatic collection. Spleen: Normal in size without focal abnormality. Small splenule inferiorly. Adrenals/Urinary Tract: No adrenal nodule. Bilateral renal parenchymal thinning without hydronephrosis or perinephric edema. No renal calculi. Urinary bladder is near completely empty and not well assessed.  Stomach/Bowel: Unenhanced stomach is unremarkable. No evidence of duodenal inflammation. Few fluid-filled small bowel loops in the lower abdomen and pelvis, nondilated and likely reactive. No obstruction. Normal appendix. Small volume of stool throughout the colon. No colonic wall thickening or inflammation. Vascular/Lymphatic: Aorto bi-iliac atherosclerosis, moderate to severe in degree. No abdominal aortic aneurysm. No adenopathy. Reproductive: Status post hysterectomy. No adnexal masses. Other: No ascites. No free air. No intra-abdominal fluid collection. Musculoskeletal: Similar degenerative change in the spine and pubic symphysis. There are no acute or suspicious osseous abnormalities. IMPRESSION: 1. Minor fat stranding about the proximal body of the pancreas, consistent with acute pancreatitis. Inflammatory  changes less pronounced than on 2022-07-16 CT. No acute peripancreatic collection. 2.  Aortic Atherosclerosis (ICD10-I70.0). Aortic Atherosclerosis (ICD10-I70.0). Electronically Signed   By: Keith Rake M.D.   On: 11/11/2019 23:34     IMPRESSION:   *   Recurrent, mild, acute pancreatitis.    *   Gastritis.  Did not tolerate omeprazole so she discontinued this.  *    Constipation  *   ESRD    PLAN:     *   Supportive care.  Fluid mgt per renal.  Note that w treating pancreatitis, GI normally prefers liberal IVF.    *   Consider outpt EUS.    *   Allow clear liquids and advance to renal diet as tolerated.  *    Added daily MiraLAX.  She can decline this if she feels she has had adequate bowel movements but given she will likely be using narcotics during this admission, she needs to prevent constipation.  *   Added renal dose Famotidine, 20 mg/day.    *   Note IgG4 ordered but this was not elevated in past.     Azucena Freed  11/13/2019, 8:14 AM Phone [phone]   Attending physician's note   I have taken a history, examined the patient and reviewed the chart. I agree with  the Advanced Practitioner's note, impression and recommendations.  69 year old female with history of ESRD on hemodialysis admitted with worsening epigastric abdominal pain  Mild elevation in lipase, mild pancreatitis noted on MRCP in 07-17-2019.  No choledocholithiasis normal CBD and LFTs. Normal IgG4 and triglycerides.  No alcohol history  She had erosive gastropathy and duodenitis on EGD March 2021  Lipase can be elevated in patients with low GFR/dialysis dependent.  Continue supportive care for now  We will start H2 blocker to help with gastroduodenitis.  She did not tolerate PPI in the past  We will arrange for outpatient follow-up, may consider EUS for further evaluation.  She has had multiple imaging in the recent past with no significant pancreatic abnormality  GI will continue to follow along  K. Denzil Magnuson , MD [phone]

## 2019-11-13 NOTE — Evaluation (Signed)
Physical Therapy Evaluation Patient Details Name: Ashley [name] MRN: [id number] DOB: May 21, 1951 Today's Date: 11/13/2019   History of Present Illness  Pt is a 69 y.o. F with significant PMH of ESRD, recurrent pancreatitis, hypertension who presents with complaints of abdominal pain. Admitted with recurrent acute on chronic pancreatitis with unclear etiology. Plan for supportive care.   Clinical Impression  Patient evaluated by Physical Therapy with no further acute PT needs identified. Pt agreeable to PT evaluation with increased encouragement. Reporting improvement in abdominal pain but still present. Pt ambulating 200 feet with no assistive device at a supervision level; supervision for safety with line management as pt is impulsive. HR stable in 70's. Pt denies dizziness/fatigue/dyspnea. Education provided regarding continued mobilization recommendations during inpatient stay and following. All education has been completed and the patient has no further questions. No follow-up Physical Therapy or equipment needs. PT is signing off. Thank you for this referral.     Follow Up Recommendations No PT follow up;Supervision - Intermittent    Equipment Recommendations  None recommended by PT    Recommendations for Other Services       Precautions / Restrictions Precautions Precautions: None Restrictions Weight Bearing Restrictions: No      Mobility  Bed Mobility Overal bed mobility: Independent                Transfers Overall transfer level: Independent Equipment used: None                Ambulation/Gait Ambulation/Gait assistance: Supervision Gait Distance (Feet): 200 Feet Assistive device: None Gait Pattern/deviations: WFL(Within Functional Limits)     General Gait Details: Supervision for line management as pt is very impulsive. No gross imbalance noted, good gait speed  Stairs            Wheelchair Mobility    Modified Rankin (Stroke Patients Only)       Balance Overall balance assessment: No apparent balance deficits (not formally assessed)                                           Pertinent Vitals/Pain Pain Assessment: Faces Faces Pain Scale: Hurts little more Pain Location: abdominal Pain Descriptors / Indicators: Discomfort Pain Intervention(s): Monitored during session    Home Living Family/patient expects to be discharged to:: Private residence Living Arrangements: Alone Available Help at Discharge: Friend(s);Family;Available PRN/intermittently Type of Home: Apartment Home Access: Elevator     Home Layout: One level Home Equipment: Walker - 2 wheels      Prior Function Level of Independence: Independent         Comments: Uses [employer] for transportation to/from HD.      Hand Dominance        Extremity/Trunk Assessment   Upper Extremity Assessment Upper Extremity Assessment: Overall WFL for tasks assessed    Lower Extremity Assessment Lower Extremity Assessment: Overall WFL for tasks assessed       Communication   Communication: No difficulties  Cognition Arousal/Alertness: Awake/alert Behavior During Therapy: Impulsive Overall Cognitive Status: Within Functional Limits for tasks assessed                                 General Comments: WFL for basic mobility, higher cognition not assessed      General Comments      Exercises  Assessment/Plan    PT Assessment Patent does not need any further PT services  PT Problem List         PT Treatment Interventions      PT Goals (Current goals can be found in the Care Plan section)  Acute Rehab PT Goals Patient Stated Goal: less pain PT Goal Formulation: All assessment and education complete, DC therapy    Frequency     Barriers to discharge        Co-evaluation               AM-PAC PT "6 Clicks" Mobility  Outcome Measure Help needed turning from your back to your side while in a flat bed  without using bedrails?: None Help needed moving from lying on your back to sitting on the side of a flat bed without using bedrails?: None Help needed moving to and from a bed to a chair (including a wheelchair)?: None Help needed standing up from a chair using your arms (e.g., wheelchair or bedside chair)?: None Help needed to walk in hospital room?: None Help needed climbing 3-5 steps with a railing? : A Little 6 Click Score: 23    End of Session   Activity Tolerance: Patient tolerated treatment well Patient left: in bed;with call bell/phone within reach Nurse Communication: Mobility status PT Visit Diagnosis: Pain Pain - part of body: (abdomen)    Time: 5170-0174 PT Time Calculation (min) (ACUTE ONLY): 12 min   Charges:   PT Evaluation $PT Eval Moderate Complexity: 1 Mod            Wyona Almas, PT, DPT Acute Rehabilitation Services Pager [phone] Office [phone]   Deno Etienne 11/13/2019, 5:03 PM

## 2019-11-13 NOTE — Progress Notes (Signed)
PROGRESS NOTE                                                                                                                                                                                                             Patient Demographics:    Ashley [name], is a 69 y.o. female, DOB - 1951-02-26, FUX:[id number]  Admit date - 11/11/2019   Admitting Physician Rise Patience, MD  Outpatient Primary MD for the patient is Sonia Side., FNP  LOS - 1  Chief Complaint  Patient presents with  . Abdominal Pain       Brief Narrative - Ashley [name] is a 69 y.o. female with history of ESRD on hemodialysis on Tuesday Thursday and Saturday with history of recurrent pancreatitis cause not clear was admitted in March/April 2021 2 months ago and history of hypertension presents to the ER with complaints of abdominal pain over the last 1 week pain is mostly in the epigastric area sharp stabbing in nature with some nausea had 2 episodes of diarrhea.  Patient was able to take her medications but the pain was worsening so patient came to the ER.  She was diagnosed with acute pancreatitis and admitted to the hospital.   Subjective:    Despina Hidden today has, No headache, No chest pain, improved abdominal pain - No Nausea, No new weakness tingling or numbness, No Cough - SOB.     Assessment  & Plan :     1.  Recurrent acute on chronic pancreatitis with unclear etiology.  With bowel rest pain is better, currently on clears, GI following, will defer management of this problem to GI.  Gentle hydration with D5W.  2.  ESRD.  On TTS schedule.  Nephrology on board.  3.  Hypertension.  On Norvasc, clonidine and beta-blocker combination.  4.  History of upper GI duodenal bleed in the past.  On PPI and stable.  5.  Smoking.  Counseled to quit.     Condition - Fair  Family Communication  :  None  Code Status :  Full  Consults  :  GI  Procedures  :    CT -  1. Minor fat stranding  about the proximal body of the pancreas, consistent with acute pancreatitis. Inflammatory changes less pronounced than on 06-29-22 CT. No acute peripancreatic collection. 2.  Aortic Atherosclerosis (ICD10-I70.0). Aortic Atherosclerosis  PUD Prophylaxis : Pepcid  Disposition Plan  :    Status is: Inpatient  Remains inpatient  appropriate because:IV treatments appropriate due to intensity of illness or inability to take PO   Dispo: The patient is from: Home              Anticipated d/c is to: Home              Anticipated d/c date is: > 3 days              Patient currently is not medically stable to d/c. Getting work-up for recurrent acute on chronic pancreatitis.   DVT Prophylaxis  :  Heparin   Lab Results  Component Value Date   PLT 328 11/12/2019    Diet :  Diet Order            Diet clear liquid Room service appropriate? Yes; Fluid consistency: Thin  Diet effective now               Inpatient Medications Scheduled Meds: . amLODipine  10 mg Oral Daily  . Chlorhexidine Gluconate Cloth  6 each Topical Q0600  . cinacalcet  30 mg Oral Q breakfast  . cloNIDine  0.1 mg Oral BID  . doxercalciferol  1 mcg Intravenous Q T,Th,Sa-HD  . famotidine  20 mg Oral Daily  . ferric citrate  210 mg Oral TID WC  . heparin  5,000 Units Subcutaneous Q8H  . metoprolol succinate  25 mg Oral Daily  . multivitamin  1 tablet Oral QHS  . polyethylene glycol  17 g Oral Daily  . sodium chloride flush  3 mL Intravenous Once  . sodium zirconium cyclosilicate  10 g Oral BID   Continuous Infusions: . dextrose     PRN Meds:.acetaminophen, HYDROcodone-acetaminophen, [DISCONTINUED] ondansetron **OR** ondansetron (ZOFRAN) IV  Antibiotics  :   Anti-infectives (From admission, onward)   None          Objective:   Vitals:   11/12/19 1812 11/12/19 2105 11/13/19 0447 11/13/19 0900  BP: 138/85 (!) 150/80 132/89 130/84  Pulse: (!) 109 88 63 63  Resp: 20 17 18 18   Temp: 97.6 F (36.4 C) 98.8  F (37.1 C) 97.8 F (36.6 C) 98 F (36.7 C)  TempSrc:  Oral Oral Oral  SpO2: 97% 94% 97% 98%  Weight:  60.1 kg    Height:        SpO2: 98 %  Wt Readings from Last 3 Encounters:  11/12/19 60.1 kg  09/11/19 63.5 kg  09/10/19 62.7 kg     Intake/Output Summary (Last 24 hours) at 11/13/2019 1116 Last data filed at 11/13/2019 1000 Gross per 24 hour  Intake 720 ml  Output 1000 ml  Net -280 ml     Physical Exam  Awake Alert, No new F.N deficits, Normal affect [place].AT,PERRAL Supple Neck,No JVD, No cervical lymphadenopathy appriciated.  Symmetrical Chest wall movement, Good air movement bilaterally, CTAB RRR,No Gallops,Rubs or new Murmurs, No Parasternal Heave +ve B.Sounds, Abd Soft, No tenderness, No organomegaly appriciated, No rebound - guarding or rigidity. No Cyanosis, Clubbing or edema, No new Rash or bruise      Data Review:    Recent Labs  Lab 11/11/19 1640 11/12/19 0632  WBC 10.0 12.0*  HGB 13.5 12.0  HCT 41.4 36.7  PLT 360 328  MCV 97.6 96.6  MCH 31.8 31.6  MCHC 32.6 32.7  RDW 15.2 14.9    Recent Labs  Lab 11/11/19 1640 11/12/19 0632 11/13/19 0709 11/13/19 0832  NA 138 137 137  --   K 5.6* 5.4* 5.5*  --  CL 92* 92* 94*  --   CO2 30 32 28  --   GLUCOSE 101* 99 83  --   BUN 56* 53* 17  --   CREATININE 9.84* 10.78* 6.30*  --   CALCIUM 9.3 9.2 9.3  --   AST 19 18 20   --   ALT 16 15 9   --   ALKPHOS 104 99 89  --   BILITOT 0.8 0.7 0.5  --   ALBUMIN 4.1 3.3* 3.3*  --   INR  --   --   --  1.1    Recent Labs  Lab 11/12/19 0032  SARSCOV2NAA NEGATIVE    ------------------------------------------------------------------------------------------------------------------ Recent Labs    11/12/19 0632  TRIG 105    Lab Results  Component Value Date   HGBA1C 5.1 06/14/2017   ------------------------------------------------------------------------------------------------------------------ No results for input(s): TSH, T4TOTAL, T3FREE, THYROIDAB in  the last 72 hours.  Invalid input(s): FREET3 ------------------------------------------------------------------------------------------------------------------ No results for input(s): VITAMINB12, FOLATE, FERRITIN, TIBC, IRON, RETICCTPCT in the last 72 hours.  Coagulation profile Recent Labs  Lab 11/13/19 0832  INR 1.1    No results for input(s): DDIMER in the last 72 hours.  Cardiac Enzymes No results for input(s): CKMB, TROPONINI, MYOGLOBIN in the last 168 hours.  Invalid input(s): CK ------------------------------------------------------------------------------------------------------------------ No results found for: BNP  Micro Results Recent Results (from the past 240 hour(s))  SARS Coronavirus 2 by RT PCR (hospital order, performed in [facility] hospital lab) Nasopharyngeal Nasopharyngeal Swab     Status: None   Collection Time: 11/12/19 12:32 AM   Specimen: Nasopharyngeal Swab  Result Value Ref Range Status   SARS Coronavirus 2 NEGATIVE NEGATIVE Final    Comment: (NOTE) SARS-CoV-2 target nucleic acids are NOT DETECTED. The SARS-CoV-2 RNA is generally detectable in upper and lower respiratory specimens during the acute phase of infection. The lowest concentration of SARS-CoV-2 viral copies this assay can detect is 250 copies / mL. A negative result does not preclude SARS-CoV-2 infection and should not be used as the sole basis for treatment or other patient management decisions.  A negative result may occur with improper specimen collection / handling, submission of specimen other than nasopharyngeal swab, presence of viral mutation(s) within the areas targeted by this assay, and inadequate number of viral copies (<250 copies / mL). A negative result must be combined with clinical observations, patient history, and epidemiological information. Fact Sheet for Patients:   StrictlyIdeas.no Fact Sheet for Healthcare  Providers: BankingDealers.co.za This test is not yet approved or cleared  by the [place] FDA and has been authorized for detection and/or diagnosis of SARS-CoV-2 by FDA under an Emergency Use Authorization (EUA).  This EUA will remain in effect (meaning this test can be used) for the duration of the COVID-19 declaration under Section 564(b)(1) of the Act, 21 U.S.C. section 360bbb-3(b)(1), unless the authorization is terminated or revoked sooner. Performed at [facility], [place] [address] [place], [place] [address]   MRSA PCR Screening     Status: None   Collection Time: 11/12/19  3:46 AM   Specimen: Nasal Mucosa; Nasopharyngeal  Result Value Ref Range Status   MRSA by PCR NEGATIVE NEGATIVE Final    Comment:        The GeneXpert MRSA Assay (FDA approved for NASAL specimens only), is one component of a comprehensive MRSA colonization surveillance program. It is not intended to diagnose MRSA infection nor to guide or monitor treatment for MRSA infections. Performed at [facility],  [address] [place], [place] [address]     Radiology Reports CT ABDOMEN PELVIS WO CONTRAST  Result Date: 11/11/2019 CLINICAL DATA:  Mid abdominal pain.  History of pancreatitis. EXAM: CT ABDOMEN AND PELVIS WITHOUT CONTRAST TECHNIQUE: Multidetector CT imaging of the abdomen and pelvis was performed following the standard protocol without IV contrast. COMPARISON:  CT 07/09/2019 FINDINGS: Lower chest: No acute findings. Chronic mild subpleural reticulation in the lung bases. 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 Coronary artery calcifications. Hepatobiliary: Unremarkable noncontrast appearance of the liver. Gallbladder is partially distended. No calcified gallstone. No biliary dilatation. Pancreas: Minor fat stranding about the proximal body of the pancreas. Inflammatory changes less pronounced than on prior CT. No pancreatic ductal dilatation. No  acute peripancreatic collection. Spleen: Normal in size without focal abnormality. Small splenule inferiorly. Adrenals/Urinary Tract: No adrenal nodule. Bilateral renal parenchymal thinning without hydronephrosis or perinephric edema. No renal calculi. Urinary bladder is near completely empty and not well assessed. Stomach/Bowel: Unenhanced stomach is unremarkable. No evidence of duodenal inflammation. Few fluid-filled small bowel loops in the lower abdomen and pelvis, nondilated and likely reactive. No obstruction. Normal appendix. Small volume of stool throughout the colon. No colonic wall thickening or inflammation. Vascular/Lymphatic: Aorto bi-iliac atherosclerosis, moderate to severe in degree. No abdominal aortic aneurysm. No adenopathy. Reproductive: Status post hysterectomy. No adnexal masses. Other: No ascites. No free air. No intra-abdominal fluid collection. Musculoskeletal: Similar degenerative change in the spine and pubic symphysis. There are no acute or suspicious osseous abnormalities. IMPRESSION: 1. Minor fat stranding about the proximal body of the pancreas, consistent with acute pancreatitis. Inflammatory changes less pronounced than on 07/16/2022 CT. No acute peripancreatic collection. 2.  Aortic Atherosclerosis (ICD10-I70.0). Aortic Atherosclerosis (ICD10-I70.0). Electronically Signed   By: Keith Rake M.D.   On: 11/11/2019 23:34    Time Spent in minutes  30   Lala Lund M.D on 11/13/2019 at 11:16 AM  To page go to [email or web address] - password [facility]

## 2019-11-14 DIAGNOSIS — K861 Other chronic pancreatitis: Secondary | ICD-10-CM

## 2019-11-14 DIAGNOSIS — Z992 Dependence on renal dialysis: Secondary | ICD-10-CM

## 2019-11-14 DIAGNOSIS — N186 End stage renal disease: Secondary | ICD-10-CM

## 2019-11-14 DIAGNOSIS — K859 Acute pancreatitis without necrosis or infection, unspecified: Principal | ICD-10-CM

## 2019-11-14 DIAGNOSIS — Z716 Tobacco abuse counseling: Secondary | ICD-10-CM

## 2019-11-14 DIAGNOSIS — R1013 Epigastric pain: Secondary | ICD-10-CM

## 2019-11-14 LAB — CBC WITH DIFFERENTIAL/PLATELET
Abs Immature Granulocytes: 0.02 10*3/uL (ref 0.00–0.07)
Basophils Absolute: 0.1 10*3/uL (ref 0.0–0.1)
Basophils Relative: 1 %
Eosinophils Absolute: 0.3 10*3/uL (ref 0.0–0.5)
Eosinophils Relative: 5 %
HCT: 36.5 % (ref 36.0–46.0)
Hemoglobin: 12.3 g/dL (ref 12.0–15.0)
Immature Granulocytes: 0 %
Lymphocytes Relative: 47 %
Lymphs Abs: 3 10*3/uL (ref 0.7–4.0)
MCH: 32.2 pg (ref 26.0–34.0)
MCHC: 33.7 g/dL (ref 30.0–36.0)
MCV: 95.5 fL (ref 80.0–100.0)
Monocytes Absolute: 0.9 10*3/uL (ref 0.1–1.0)
Monocytes Relative: 14 %
Neutro Abs: 2.1 10*3/uL (ref 1.7–7.7)
Neutrophils Relative %: 33 %
Platelets: 312 10*3/uL (ref 150–400)
RBC: 3.82 MIL/uL — ABNORMAL LOW (ref 3.87–5.11)
RDW: 13.8 % (ref 11.5–15.5)
WBC: 6.3 10*3/uL (ref 4.0–10.5)
nRBC: 0 % (ref 0.0–0.2)

## 2019-11-14 LAB — MAGNESIUM: Magnesium: 1.9 mg/dL (ref 1.7–2.4)

## 2019-11-14 LAB — COMPREHENSIVE METABOLIC PANEL
ALT: 12 U/L (ref 0–44)
AST: 18 U/L (ref 15–41)
Albumin: 3.1 g/dL — ABNORMAL LOW (ref 3.5–5.0)
Alkaline Phosphatase: 88 U/L (ref 38–126)
Anion gap: 15 (ref 5–15)
BUN: 28 mg/dL — ABNORMAL HIGH (ref 8–23)
CO2: 28 mmol/L (ref 22–32)
Calcium: 8.6 mg/dL — ABNORMAL LOW (ref 8.9–10.3)
Chloride: 87 mmol/L — ABNORMAL LOW (ref 98–111)
Creatinine, Ser: 8.44 mg/dL — ABNORMAL HIGH (ref 0.44–1.00)
GFR calc Af Amer: 5 mL/min — ABNORMAL LOW (ref >60)
GFR calc non Af Amer: 4 mL/min — ABNORMAL LOW (ref >60)
Glucose, Bld: 90 mg/dL (ref 70–99)
Potassium: 4.7 mmol/L (ref 3.5–5.1)
Sodium: 130 mmol/L — ABNORMAL LOW (ref 135–145)
Total Bilirubin: 0.9 mg/dL (ref 0.3–1.2)
Total Protein: 6.9 g/dL (ref 6.5–8.1)

## 2019-11-14 LAB — PHOSPHORUS: Phosphorus: 5.6 mg/dL — ABNORMAL HIGH (ref 2.5–4.6)

## 2019-11-14 LAB — BRAIN NATRIURETIC PEPTIDE: B Natriuretic Peptide: 212.1 pg/mL — ABNORMAL HIGH (ref 0.0–100.0)

## 2019-11-14 MED ORDER — CHLORHEXIDINE GLUCONATE CLOTH 2 % EX PADS: 6.0000 | MEDICATED_PAD | Freq: Every day | CUTANEOUS | Status: DC

## 2019-11-14 NOTE — Progress Notes (Signed)
Renal Navigator received update from Renal PA that patient may be ready for discharge tomorrow pending tolerance of soft diet. Navigator discussed with Dr. Candiss Norse, who states patient will be discharged at 11:00am.  Navigator spoke with patient[facility] who states they will fit patient in when she arrives, as patient's normal seat time is 7:20am. Navigator met with patient to discuss HD treatment tomorrow, as this is her scheduled day. She states she will either call her daughter (who is not feeling well today) or her neighbor to come get her in the morning, or may need an [place] Popper arranged for her at discharge. We have rescheduled her [facility] [place] bus to take her home from OP HD treatment with a pick up window from 3:45pm-4:15pm. Clinic aware.  Navigator will follow up in the morning.   Alphonzo Cruise, [place] Renal Navigator [phone]

## 2019-11-14 NOTE — Progress Notes (Signed)
PROGRESS NOTE                                                                                                                                                                                                             Patient Demographics:    Ashley [name], is a 69 y.o. female, DOB - 1950/12/27, ZOX:[id number]  Admit date - 11/11/2019   Admitting Physician Rise Patience, MD  Outpatient Primary MD for the patient is Sonia Side., FNP  LOS - 2  Chief Complaint  Patient presents with  . Abdominal Pain       Brief Narrative - Ashley [name] is a 69 y.o. female with history of ESRD on hemodialysis on Tuesday Thursday and Saturday with history of recurrent pancreatitis cause not clear was admitted in March/April 2021 2 months ago and history of hypertension presents to the ER with complaints of abdominal pain over the last 1 week pain is mostly in the epigastric area sharp stabbing in nature with some nausea had 2 episodes of diarrhea.  Patient was able to take her medications but the pain was worsening so patient came to the ER.  She was diagnosed with acute pancreatitis and admitted to the hospital.   Subjective:   Patient in bed, appears comfortable, denies any headache, no fever, no chest pain or pressure, no shortness of breath , no abdominal pain. No focal weakness.   Assessment  & Plan :     1.  Recurrent acute on chronic pancreatitis with unclear etiology.  Multiple previous episodes with her work-up which has been negative so far, seen by GI, with conservative management which included bowel rest and gentle IV fluids she is much better, advance to soft diet, if stable likely discharge tomorrow after dialysis with outpatient GI follow-up and EUS.  2.  ESRD.  On TTS schedule.  Nephrology on board.  3.  Hypertension.  On Norvasc, clonidine and beta-blocker combination.  4.  History of upper GI duodenal bleed in the past.  On PPI and stable.  5.  Smoking.   Counseled to quit.     Condition - Fair  Family Communication  :  None  Code Status :  Full  Consults  :  GI  Procedures  :    CT -  1. Minor fat stranding about the proximal body of the pancreas, consistent with acute pancreatitis. Inflammatory changes less pronounced than on July 03, 2022 CT. No acute peripancreatic collection. 2.  Aortic Atherosclerosis (ICD10-I70.0).  Aortic Atherosclerosis  PUD Prophylaxis : Pepcid  Disposition Plan  :    Status is: Inpatient  Remains inpatient appropriate because:IV treatments appropriate due to intensity of illness or inability to take PO   Dispo: The patient is from: Home              Anticipated d/c is to: Home              Anticipated d/c date is: > 3 days              Patient currently is not medically stable to d/c. Getting work-up for recurrent acute on chronic pancreatitis.   DVT Prophylaxis  :  Heparin   Lab Results  Component Value Date   PLT 312 11/14/2019    Diet :  Diet Order            DIET SOFT Room service appropriate? Yes; Fluid consistency: Thin  Diet effective now               Inpatient Medications Scheduled Meds: . amLODipine  10 mg Oral Daily  . Chlorhexidine Gluconate Cloth  6 each Topical Q0600  . cinacalcet  30 mg Oral Q breakfast  . cloNIDine  0.1 mg Oral BID  . doxercalciferol  1 mcg Intravenous Q T,Th,Sa-HD  . famotidine  20 mg Oral Daily  . ferric citrate  210 mg Oral TID WC  . heparin  5,000 Units Subcutaneous Q8H  . metoprolol succinate  25 mg Oral Daily  . multivitamin  1 tablet Oral QHS  . polyethylene glycol  17 g Oral Daily  . sodium chloride flush  3 mL Intravenous Once   Continuous Infusions: . dextrose 35 mL/hr at 11/13/19 1201   PRN Meds:.acetaminophen, HYDROcodone-acetaminophen, [DISCONTINUED] ondansetron **OR** ondansetron (ZOFRAN) IV  Antibiotics  :   Anti-infectives (From admission, onward)   None          Objective:   Vitals:   11/13/19 2048 11/14/19 0420  11/14/19 0643 11/14/19 0919  BP: 132/77 133/84  138/71  Pulse: 60 (!) 53  (!) 58  Resp: 18 18  20   Temp: 98.4 F (36.9 C) 97.9 F (36.6 C)  98.9 F (37.2 C)  TempSrc: Oral     SpO2: 96% 100%  100%  Weight:   59.6 kg   Height:        SpO2: 100 %  Wt Readings from Last 3 Encounters:  11/14/19 59.6 kg  09/11/19 63.5 kg  09/10/19 62.7 kg     Intake/Output Summary (Last 24 hours) at 11/14/2019 1109 Last data filed at 11/14/2019 0800 Gross per 24 hour  Intake 1899.87 ml  Output 0 ml  Net 1899.87 ml     Physical Exam  Awake Alert, No new F.N deficits, Normal affect [place].AT,PERRAL Supple Neck,No JVD, No cervical lymphadenopathy appriciated.  Symmetrical Chest wall movement, Good air movement bilaterally, CTAB RRR,No Gallops, Rubs or new Murmurs, No Parasternal Heave +ve B.Sounds, Abd Soft, No tenderness, No organomegaly appriciated, No rebound - guarding or rigidity. No Cyanosis, Clubbing or edema, No new Rash or bruise      Data Review:    Recent Labs  Lab 11/11/19 1640 11/12/19 0632 11/14/19 0721  WBC 10.0 12.0* 6.3  HGB 13.5 12.0 12.3  HCT 41.4 36.7 36.5  PLT 360 328 312  MCV 97.6 96.6 95.5  MCH 31.8 31.6 32.2  MCHC 32.6 32.7 33.7  RDW 15.2 14.9 13.8  LYMPHSABS  --   --  3.0  MONOABS  --   --  0.9  EOSABS  --   --  0.3  BASOSABS  --   --  0.1    Recent Labs  Lab 11/11/19 1640 11/12/19 0632 11/13/19 0709 11/13/19 0832 11/14/19 0721  NA 138 137 137  --  130*  K 5.6* 5.4* 5.5*  --  4.7  CL 92* 92* 94*  --  87*  CO2 30 32 28  --  28  GLUCOSE 101* 99 83  --  90  BUN 56* 53* 17  --  28*  CREATININE 9.84* 10.78* 6.30*  --  8.44*  CALCIUM 9.3 9.2 9.3  --  8.6*  AST 19 18 20   --  18  ALT 16 15 9   --  12  ALKPHOS 104 99 89  --  88  BILITOT 0.8 0.7 0.5  --  0.9  ALBUMIN 4.1 3.3* 3.3*  --  3.1*  MG  --   --   --   --  1.9  INR  --   --   --  1.1  --   BNP  --   --   --   --  212.1*    Recent Labs  Lab 11/12/19 0032 11/14/19 0721  BNP  --  212.1*   SARSCOV2NAA NEGATIVE  --     ------------------------------------------------------------------------------------------------------------------ Recent Labs    11/12/19 0632  TRIG 105    Lab Results  Component Value Date   HGBA1C 5.1 06/14/2017   ------------------------------------------------------------------------------------------------------------------ No results for input(s): TSH, T4TOTAL, T3FREE, THYROIDAB in the last 72 hours.  Invalid input(s): FREET3 ------------------------------------------------------------------------------------------------------------------ No results for input(s): VITAMINB12, FOLATE, FERRITIN, TIBC, IRON, RETICCTPCT in the last 72 hours.  Coagulation profile Recent Labs  Lab 11/13/19 0832  INR 1.1    No results for input(s): DDIMER in the last 72 hours.  Cardiac Enzymes No results for input(s): CKMB, TROPONINI, MYOGLOBIN in the last 168 hours.  Invalid input(s): CK ------------------------------------------------------------------------------------------------------------------    Component Value Date/Time   BNP 212.1 (H) 11/14/2019 8657    Micro Results Recent Results (from the past 240 hour(s))  SARS Coronavirus 2 by RT PCR (hospital order, performed in [facility] hospital lab) Nasopharyngeal Nasopharyngeal Swab     Status: None   Collection Time: 11/12/19 12:32 AM   Specimen: Nasopharyngeal Swab  Result Value Ref Range Status   SARS Coronavirus 2 NEGATIVE NEGATIVE Final    Comment: (NOTE) SARS-CoV-2 target nucleic acids are NOT DETECTED. The SARS-CoV-2 RNA is generally detectable in upper and lower respiratory specimens during the acute phase of infection. The lowest concentration of SARS-CoV-2 viral copies this assay can detect is 250 copies / mL. A negative result does not preclude SARS-CoV-2 infection and should not be used as the sole basis for treatment or other patient management decisions.  A negative result may  occur with improper specimen collection / handling, submission of specimen other than nasopharyngeal swab, presence of viral mutation(s) within the areas targeted by this assay, and inadequate number of viral copies (<250 copies / mL). A negative result must be combined with clinical observations, patient history, and epidemiological information. Fact Sheet for Patients:   StrictlyIdeas.no Fact Sheet for Healthcare Providers: BankingDealers.co.za This test is not yet approved or cleared  by the [place] FDA and has been authorized for detection and/or diagnosis of SARS-CoV-2 by FDA under an Emergency Use Authorization (EUA).  This EUA will remain in effect (meaning this test can be used) for the duration of  the COVID-19 declaration under Section 564(b)(1) of the Act, 21 U.S.C. section 360bbb-3(b)(1), unless the authorization is terminated or revoked sooner. Performed at [facility], [place] [address], [place], [place] [address]   MRSA PCR Screening     Status: None   Collection Time: 11/12/19  3:46 AM   Specimen: Nasal Mucosa; Nasopharyngeal  Result Value Ref Range Status   MRSA by PCR NEGATIVE NEGATIVE Final    Comment:        The GeneXpert MRSA Assay (FDA approved for NASAL specimens only), is one component of a comprehensive MRSA colonization surveillance program. It is not intended to diagnose MRSA infection nor to guide or monitor treatment for MRSA infections. Performed at [facility], [place] [address], [place], [place] [address]     Radiology Reports CT ABDOMEN PELVIS WO CONTRAST  Result Date: 11/11/2019 CLINICAL DATA:  Mid abdominal pain.  History of pancreatitis. EXAM: CT ABDOMEN AND PELVIS WITHOUT CONTRAST TECHNIQUE: Multidetector CT imaging of the abdomen and pelvis was performed following the standard protocol without IV contrast. COMPARISON:  CT 07/09/2019 FINDINGS: Lower chest: No acute  findings. Chronic mild subpleural reticulation in the lung bases. 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 Coronary artery calcifications. Hepatobiliary: Unremarkable noncontrast appearance of the liver. Gallbladder is partially distended. No calcified gallstone. No biliary dilatation. Pancreas: Minor fat stranding about the proximal body of the pancreas. Inflammatory changes less pronounced than on prior CT. No pancreatic ductal dilatation. No acute peripancreatic collection. Spleen: Normal in size without focal abnormality. Small splenule inferiorly. Adrenals/Urinary Tract: No adrenal nodule. Bilateral renal parenchymal thinning without hydronephrosis or perinephric edema. No renal calculi. Urinary bladder is near completely empty and not well assessed. Stomach/Bowel: Unenhanced stomach is unremarkable. No evidence of duodenal inflammation. Few fluid-filled small bowel loops in the lower abdomen and pelvis, nondilated and likely reactive. No obstruction. Normal appendix. Small volume of stool throughout the colon. No colonic wall thickening or inflammation. Vascular/Lymphatic: Aorto bi-iliac atherosclerosis, moderate to severe in degree. No abdominal aortic aneurysm. No adenopathy. Reproductive: Status post hysterectomy. No adnexal masses. Other: No ascites. No free air. No intra-abdominal fluid collection. Musculoskeletal: Similar degenerative change in the spine and pubic symphysis. There are no acute or suspicious osseous abnormalities. IMPRESSION: 1. Minor fat stranding about the proximal body of the pancreas, consistent with acute pancreatitis. Inflammatory changes less pronounced than on 07-23-2022 CT. No acute peripancreatic collection. 2.  Aortic Atherosclerosis (ICD10-I70.0). Aortic Atherosclerosis (ICD10-I70.0). Electronically Signed   By: Keith Rake M.D.   On: 11/11/2019 23:34    Time Spent in minutes  30   Lala Lund M.D on 11/14/2019 at 11:09 AM  To page go to [email or web address] - password  [facility]

## 2019-11-14 NOTE — Progress Notes (Signed)
    Progress Note   Subjective  Chief Complaint: Recurrent pancreatitis?  Today, the patient has no abdominal pain at all and tolerating a clear liquid diet.  She is ready to eat more and in fact is pretty much ready to go home.   Objective   Vital signs in last 24 hours: Temp:  [97.9 F (36.6 C)-98.9 F (37.2 C)] 98.9 F (37.2 C) (06/07 0919) Pulse Rate:  [53-67] 58 (06/07 0919) Resp:  [18-20] 20 (06/07 0919) BP: (129-138)/(71-91) 138/71 (06/07 0919) SpO2:  [96 %-100 %] 100 % (06/07 0919) Weight:  [59.6 kg] 59.6 kg (06/07 0643) Last BM Date: 11/10/19 General:    AA female in NAD Heart:  Regular rate and rhythm; no murmurs Lungs: Respirations even and unlabored, lungs CTA bilaterally Abdomen:  Soft, nontender and nondistended. Normal bowel sounds. Extremities:  Without edema. Neurologic:  Alert and oriented,  grossly normal neurologically. Psych:  Cooperative. Normal mood and affect.  Intake/Output from previous day: 06/06 0701 - 06/07 0700 In: 1304.3 [P.O.:1200; I.V.:104.3] Out: 0  Intake/Output this shift: Total I/O In: 955.6 [P.O.:360; I.V.:595.6] Out: 0   Lab Results: Recent Labs    11/11/19 1640 11/12/19 0632 11/14/19 0721  WBC 10.0 12.0* 6.3  HGB 13.5 12.0 12.3  HCT 41.4 36.7 36.5  PLT 360 328 312   BMET Recent Labs    11/12/19 0632 11/13/19 0709 11/14/19 0721  NA 137 137 130*  K 5.4* 5.5* 4.7  CL 92* 94* 87*  CO2 32 28 28  GLUCOSE 99 83 90  BUN 53* 17 28*  CREATININE 10.78* 6.30* 8.44*  CALCIUM 9.2 9.3 8.6*   LFT Recent Labs    11/12/19 0632 11/13/19 0709 11/14/19 0721  PROT 7.5   < > 6.9  ALBUMIN 3.3*   < > 3.1*  AST 18   < > 18  ALT 15   < > 12  ALKPHOS 99   < > 88  BILITOT 0.7   < > 0.9  BILIDIR <0.1  --   --   IBILI NOT CALCULATED  --   --    < > = values in this interval not displayed.   PT/INR Recent Labs    11/13/19 0832  LABPROT 13.6  INR 1.1      Assessment / Plan:   Assessment: 1.  Recurrent pancreatitis?   Versus chronic pancreatitis: Question of mild pancreatitis versus other etiology 2.  Gastritis: Controlled now on famotidine 3.  ESRD  Plan: 1.  Will arrange for outpatient EUS.  Our office will contact her with an appointment. 2.  Start the patient on a soft diet today.  If she is tolerating well then can likely be discharged within the next 12 to 24 hours 3.  Continue Famotidine 20 mg daily 4.  Please await any final recommendations from Dr. Bryan Lemma later today.  Thank you for your kind consultation, we will sign off.   LOS: 2 days   Levin Erp  11/14/2019, 10:13 AM

## 2019-11-14 NOTE — Evaluation (Signed)
Occupational Therapy Evaluation and Discharge  Patient Details Name: Ashley [name] MRN: [id number] DOB: 06-21-1950 Today's Date: 11/14/2019    History of Present Illness Pt is a 69 y.o. F with significant PMH of ESRD, recurrent pancreatitis, hypertension who presents with complaints of abdominal pain. Admitted with recurrent acute on chronic pancreatitis with unclear etiology. Plan for supportive care.    Clinical Impression   Pt is functioning independently in ADL and mobility. No OT needs. Encouraged pt to walk in hall.     Follow Up Recommendations  No OT follow up    Equipment Recommendations  None recommended by OT    Recommendations for Other Services       Precautions / Restrictions Precautions Precautions: None Restrictions Weight Bearing Restrictions: No      Mobility Bed Mobility Overal bed mobility: Independent                Transfers Overall transfer level: Independent Equipment used: None                  Balance Overall balance assessment: No apparent balance deficits (not formally assessed)                                         ADL either performed or assessed with clinical judgement   ADL Overall ADL's : Independent                                             Vision Baseline Vision/History: Wears glasses Wears Glasses: At all times Patient Visual Report: No change from baseline       Perception     Praxis      Pertinent Vitals/Pain Pain Assessment: No/denies pain     Hand Dominance Right   Extremity/Trunk Assessment Upper Extremity Assessment Upper Extremity Assessment: Overall WFL for tasks assessed   Lower Extremity Assessment Lower Extremity Assessment: Defer to PT evaluation       Communication Communication Communication: No difficulties   Cognition Arousal/Alertness: Awake/alert Behavior During Therapy: WFL for tasks assessed/performed Overall Cognitive Status:  Within Functional Limits for tasks assessed                                     General Comments       Exercises     Shoulder Instructions      Home Living Family/patient expects to be discharged to:: Private residence Living Arrangements: Alone Available Help at Discharge: Friend(s);Family;Available PRN/intermittently Type of Home: Apartment Home Access: Elevator     Home Layout: One level     Bathroom Shower/Tub: Teacher, early years/pre: Standard     Home Equipment: Environmental consultant - 2 wheels          Prior Functioning/Environment Level of Independence: Independent        Comments: Uses [facility] for transportation to/from HD.         OT Problem List:        OT Treatment/Interventions:      OT Goals(Current goals can be found in the care plan section) Acute Rehab OT Goals Patient Stated Goal: return home  OT Frequency:     Barriers to D/C:  Co-evaluation              AM-PAC OT "6 Clicks" Daily Activity     Outcome Measure Help from another person eating meals?: None Help from another person taking care of personal grooming?: None Help from another person toileting, which includes using toliet, bedpan, or urinal?: None Help from another person bathing (including washing, rinsing, drying)?: None Help from another person to put on and taking off regular upper body clothing?: None Help from another person to put on and taking off regular lower body clothing?: None 6 Click Score: 24   End of Session    Activity Tolerance: Patient tolerated treatment well Patient left: in bed;with call bell/phone within reach  OT Visit Diagnosis: Muscle weakness (generalized) (M62.81)                Time: 5208-0223 OT Time Calculation (min): 20 min Charges:  OT General Charges $OT Visit: 1 Visit OT Evaluation $OT Eval Low Complexity: 1 Low  Nestor Lewandowsky, OTR/L Acute Rehabilitation Services Pager: [phone] Office:  [phone]  Ashley [name] 11/14/2019, 10:54 AM

## 2019-11-14 NOTE — Progress Notes (Signed)
[facility] Progress Note   Subjective:   Patient seen and examined at bedside.  Feeling much better today.  Able to tolerated liquid diet.  No n/v or abdominal pain.   Objective Vitals:   11/13/19 2048 11/14/19 0420 11/14/19 0643 11/14/19 0919  BP: 132/77 133/84  138/71  Pulse: 60 (!) 53  (!) 58  Resp: 18 18  20   Temp: 98.4 F (36.9 C) 97.9 F (36.6 C)  98.9 F (37.2 C)  TempSrc: Oral     SpO2: 96% 100%  100%  Weight:   59.6 kg   Height:       Physical Exam General:NAD Heart:RRR Lungs:CTAB, no rales Abdomen:soft, NTND Extremities:no LE edema Dialysis Access: LU AVF +b   Filed Weights   11/12/19 1334 11/12/19 2105 11/14/19 0643  Weight: 60.1 kg 60.1 kg 59.6 kg    Intake/Output Summary (Last 24 hours) at 11/14/2019 1103 Last data filed at 11/14/2019 0800 Gross per 24 hour  Intake 1899.87 ml  Output 0 ml  Net 1899.87 ml    Additional Objective Labs: Basic Metabolic Panel: Recent Labs  Lab 11/12/19 0632 11/13/19 0709 11/14/19 0721  NA 137 137 130*  K 5.4* 5.5* 4.7  CL 92* 94* 87*  CO2 32 28 28  GLUCOSE 99 83 90  BUN 53* 17 28*  CREATININE 10.78* 6.30* 8.44*  CALCIUM 9.2 9.3 8.6*   Liver Function Tests: Recent Labs  Lab 11/12/19 0632 11/13/19 0709 11/14/19 0721  AST 18 20 18   ALT 15 9 12   ALKPHOS 99 89 88  BILITOT 0.7 0.5 0.9  PROT 7.5 7.0 6.9  ALBUMIN 3.3* 3.3* 3.1*   Recent Labs  Lab 11/11/19 1640 11/13/19 0709  LIPASE 110* 76*   CBC: Recent Labs  Lab 11/11/19 1640 11/12/19 0632 11/14/19 0721  WBC 10.0 12.0* 6.3  NEUTROABS  --   --  2.1  HGB 13.5 12.0 12.3  HCT 41.4 36.7 36.5  MCV 97.6 96.6 95.5  PLT 360 328 312    Medications: . dextrose 35 mL/hr at 11/13/19 1201   . amLODipine  10 mg Oral Daily  . Chlorhexidine Gluconate Cloth  6 each Topical Q0600  . cinacalcet  30 mg Oral Q breakfast  . cloNIDine  0.1 mg Oral BID  . doxercalciferol  1 mcg Intravenous Q T,Th,Sa-HD  . famotidine  20 mg Oral Daily  .  ferric citrate  210 mg Oral TID WC  . heparin  5,000 Units Subcutaneous Q8H  . metoprolol succinate  25 mg Oral Daily  . multivitamin  1 tablet Oral QHS  . polyethylene glycol  17 g Oral Daily  . sodium chloride flush  3 mL Intravenous Once    Dialysis Orders: TTS [place] 3.5 hr #DW 59.5 - 2 K 2.25 Ca profile 4 350./800 left upper AVF hectorol 1 heparin 2000 plus 1000 mid tmt no Mircera  Recent labs: hgb 1.23 Ca P ok Recent missed tmt 5/27 and 5/31 -usually misses 1 or 2 per month  Assessment/Plan: 1. Recurrent pancreatitis/gastritis- not clear whatprovokes - she needs to avoid fatty foods - seen by GI - starting pepcid- advancing diet to soft. GI to arrange outpatient EUS.  2. ESRD- TTS - next HD Tuesday  K improved to 4.7 after lokelma.  Orders written for HD tomorrow per regular schedule.  3. Hypertension/volume- net UF 1 L Saturday - post wt not done but based on pre weight it would have been 59.1.  BP well controlled on MTP,  clonidine, amlodipine- plan to lower edw to 59 at d/c 4. Anemia- hgb 12.3 no indication for ESA  5. Metabolic bone disease- Continue hecotorol/sensipar- resume [place] with soft diet.  CCa at goal. Will check phos.  6. Nutrition- Advancing to soft diet today, cont multivits- d/c nepro for now high in fat 7. Hx GIB in March 2021 -has been on outpatient heparin with stable hgb 8. Tobacco abuse -on going -  9. Hx polysubstance abuse - claims no recent use  Jen Mow, PA-C [facility] Pager: [phone] 11/14/2019,11:03 AM  LOS: 2 days

## 2019-11-15 LAB — CBC WITH DIFFERENTIAL/PLATELET
Abs Immature Granulocytes: 0.02 10*3/uL (ref 0.00–0.07)
Basophils Absolute: 0.1 10*3/uL (ref 0.0–0.1)
Basophils Relative: 1 %
Eosinophils Absolute: 0.3 10*3/uL (ref 0.0–0.5)
Eosinophils Relative: 4 %
HCT: 33.4 % — ABNORMAL LOW (ref 36.0–46.0)
Hemoglobin: 11.4 g/dL — ABNORMAL LOW (ref 12.0–15.0)
Immature Granulocytes: 0 %
Lymphocytes Relative: 46 %
Lymphs Abs: 3.4 10*3/uL (ref 0.7–4.0)
MCH: 32 pg (ref 26.0–34.0)
MCHC: 34.1 g/dL (ref 30.0–36.0)
MCV: 93.8 fL (ref 80.0–100.0)
Monocytes Absolute: 1 10*3/uL (ref 0.1–1.0)
Monocytes Relative: 13 %
Neutro Abs: 2.7 10*3/uL (ref 1.7–7.7)
Neutrophils Relative %: 36 %
Platelets: 287 10*3/uL (ref 150–400)
RBC: 3.56 MIL/uL — ABNORMAL LOW (ref 3.87–5.11)
RDW: 13.2 % (ref 11.5–15.5)
WBC: 7.4 10*3/uL (ref 4.0–10.5)
nRBC: 0 % (ref 0.0–0.2)

## 2019-11-15 LAB — COMPREHENSIVE METABOLIC PANEL
ALT: 10 U/L (ref 0–44)
AST: 14 U/L — ABNORMAL LOW (ref 15–41)
Albumin: 2.9 g/dL — ABNORMAL LOW (ref 3.5–5.0)
Alkaline Phosphatase: 85 U/L (ref 38–126)
Anion gap: 15 (ref 5–15)
BUN: 40 mg/dL — ABNORMAL HIGH (ref 8–23)
CO2: 28 mmol/L (ref 22–32)
Calcium: 8 mg/dL — ABNORMAL LOW (ref 8.9–10.3)
Chloride: 87 mmol/L — ABNORMAL LOW (ref 98–111)
Creatinine, Ser: 10.26 mg/dL — ABNORMAL HIGH (ref 0.44–1.00)
GFR calc Af Amer: 4 mL/min — ABNORMAL LOW (ref >60)
GFR calc non Af Amer: 3 mL/min — ABNORMAL LOW (ref >60)
Glucose, Bld: 66 mg/dL — ABNORMAL LOW (ref 70–99)
Potassium: 4.6 mmol/L (ref 3.5–5.1)
Sodium: 130 mmol/L — ABNORMAL LOW (ref 135–145)
Total Bilirubin: 0.2 mg/dL — ABNORMAL LOW (ref 0.3–1.2)
Total Protein: 6.5 g/dL (ref 6.5–8.1)

## 2019-11-15 LAB — IGG 4: IgG, Subclass 4: 30 mg/dL (ref 2–96)

## 2019-11-15 LAB — MAGNESIUM: Magnesium: 2.1 mg/dL (ref 1.7–2.4)

## 2019-11-15 LAB — BRAIN NATRIURETIC PEPTIDE: B Natriuretic Peptide: 533.8 pg/mL — ABNORMAL HIGH (ref 0.0–100.0)

## 2019-11-15 NOTE — Discharge Summary (Signed)
Ashley [name] HYW:[id number] DOB: 1951/05/22 DOA: 11/11/2019  PCP: Sonia Side., FNP  Admit date: 11/11/2019  Discharge date: 11/15/2019  Admitted From: Home   Disposition:  Home   Recommendations for Outpatient Follow-up:   Follow up with PCP in 1-2 weeks  PCP Please obtain BMP/CBC, 2 view CXR in 1week,  (see Discharge instructions)   PCP Please follow up on the following pending results:    Home Health: None   Equipment/Devices: None  Consultations: GI, nephrology Discharge Condition: Stable    CODE STATUS: Full    Diet Recommendation: Low-fat, low carbohydrate diet with 1.2 L fluid restriction per day.   Chief Complaint  Patient presents with  . Abdominal Pain     Brief history of present illness from the day of admission and additional interim summary    Ashley E Cooperis a 69 y.o.femalewithhistory of ESRD on hemodialysis on Tuesday Thursday and Saturday with history of recurrent pancreatitis cause not clear was admitted in March/April 2021 2 months ago and history of hypertension presents to the ER with complaints of abdominal pain over the last 1 week pain is mostly in the epigastric area sharp stabbing in nature with some nausea had 2 episodes of diarrhea. Patient was able to take her medications but the pain was worsening so patient came to the ER.  She was diagnosed with acute pancreatitis and admitted to the hospital.                                                                 Hospital Course     1.  Recurrent acute on chronic pancreatitis with unclear etiology.  Multiple previous episodes with her work-up which has been negative so far, seen by GI, with conservative management which included bowel rest and gentle IV fluids she is much better, he is symptom-free on soft diet for 48 hours and  will be discharged home with outpatient GI and PCP follow-up, her IgG4 levels were negative and she may require outpatient EUS by GI.  2.  ESRD.  On TTS schedule.  Nephrology on board.  Will get dialyzed today outpatient per nephrology.  Nephrology will arrange all logistics.  3.  Hypertension.    Continue home regimen.  4.  History of upper GI duodenal bleed in the past.  On PPI and stable.  5.  Smoking.  Counseled to quit.    Discharge diagnosis     Active Problems:   Hypertension   ESRD (end stage renal disease) on dialysis ([place])   Acute pancreatitis   Acute on chronic pancreatitis ([place])   Epigastric pain   Encounter for smoking cessation counseling    Discharge instructions    Discharge Instructions    Discharge instructions   Complete by: As directed    Follow  with Primary MD Sonia Side., FNP in 7 days   Get CBC, CMP   checked next visit within 1 week by Primary MD   Activity: As tolerated with Full fall precautions use walker/cane & assistance as needed  Disposition Home    Diet: Soft low-fat and low carbohydrate diet with 1.2 L fluid restriction per day.   Special Instructions: If you have smoked or chewed Tobacco  in the last 2 yrs please stop smoking, stop any regular Alcohol  and or any Recreational drug use.  On your next visit with your primary care physician please Get Medicines reviewed and adjusted.  Please request your Prim.MD to go over all Hospital Tests and Procedure/Radiological results at the follow up, please get all Hospital records sent to your Prim MD by signing hospital release before you go home.  If you experience worsening of your admission symptoms, develop shortness of breath, life threatening emergency, suicidal or homicidal thoughts you must seek medical attention immediately by calling 911 or calling your MD immediately  if symptoms less severe.  You Must read complete instructions/literature along with all the possible  adverse reactions/side effects for all the Medicines you take and that have been prescribed to you. Take any new Medicines after you have completely understood and accpet all the possible adverse reactions/side effects.   Increase activity slowly   Complete by: As directed       Discharge Medications   Allergies as of 11/15/2019      Reactions   Aspirin Nausea And Vomiting   Stomach ache   Ibuprofen Nausea And Vomiting   Stomach ache      Medication List    TAKE these medications   acetaminophen 500 MG tablet Commonly known as: TYLENOL Take 1,000 mg by mouth every 6 (six) hours as needed for headache (pain).   amLODipine 10 MG tablet Commonly known as: NORVASC Take 1 tablet (10 mg total) by mouth daily. What changed: when to take this   Auryxia 1 GM 210 MG(Fe) tablet Generic drug: ferric citrate Take 210 mg by mouth 3 (three) times daily with meals.   cinacalcet 30 MG tablet Commonly known as: SENSIPAR Take 30 mg by mouth at bedtime.   cloNIDine 0.1 MG tablet Commonly known as: CATAPRES Take 0.1 mg by mouth 2 (two) times daily.   DIALYVITE 800 WITH ZINC 0.8 MG Tabs Take 1 tablet by mouth daily with breakfast.   feeding supplement (NEPRO CARB STEADY) Liqd Take 237 mLs by mouth 3 (three) times daily as needed (Supplement). What changed:   when to take this  reasons to take this   lidocaine-prilocaine cream Commonly known as: EMLA Apply 1 application topically See admin instructions. Apply small amount to access site on Tuesday, Thursday, Saturday one hour before dialysis. Cover with occlusive dressing (saran wrap)   metoprolol succinate 25 MG 24 hr tablet Commonly known as: TOPROL-XL Take 1 tablet (25 mg total) by mouth daily.   polyethylene glycol 17 g packet Commonly known as: MiraLax Take 17 g by mouth 2 (two) times daily. What changed:   when to take this  reasons to take this       [place], [place]., FNP. Schedule an  appointment as soon as possible for a visit in 1 week(s).   Specialty: Family Medicine Contact information: [place] [address] [phone]        Mansouraty, Telford Nab., MD. Schedule an appointment as  soon as possible for a visit in 1 week(s).   Specialties: Gastroenterology, Internal Medicine Why: EUS Contact information: [place] [address] [phone]           Major procedures and Radiology Reports - PLEASE review detailed and final reports thoroughly  -       CT ABDOMEN PELVIS WO CONTRAST  Result Date: 11/11/2019 CLINICAL DATA:  Mid abdominal pain.  History of pancreatitis. EXAM: CT ABDOMEN AND PELVIS WITHOUT CONTRAST TECHNIQUE: Multidetector CT imaging of the abdomen and pelvis was performed following the standard protocol without IV contrast. COMPARISON:  CT 07/09/2019 FINDINGS: Lower chest: No acute findings. Chronic mild subpleural reticulation in the lung bases. 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 Coronary artery calcifications. Hepatobiliary: Unremarkable noncontrast appearance of the liver. Gallbladder is partially distended. No calcified gallstone. No biliary dilatation. Pancreas: Minor fat stranding about the proximal body of the pancreas. Inflammatory changes less pronounced than on prior CT. No pancreatic ductal dilatation. No acute peripancreatic collection. Spleen: Normal in size without focal abnormality. Small splenule inferiorly. Adrenals/Urinary Tract: No adrenal nodule. Bilateral renal parenchymal thinning without hydronephrosis or perinephric edema. No renal calculi. Urinary bladder is near completely empty and not well assessed. Stomach/Bowel: Unenhanced stomach is unremarkable. No evidence of duodenal inflammation. Few fluid-filled small bowel loops in the lower abdomen and pelvis, nondilated and likely reactive. No obstruction. Normal appendix. Small volume of stool throughout the colon. No colonic wall thickening or  inflammation. Vascular/Lymphatic: Aorto bi-iliac atherosclerosis, moderate to severe in degree. No abdominal aortic aneurysm. No adenopathy. Reproductive: Status post hysterectomy. No adnexal masses. Other: No ascites. No free air. No intra-abdominal fluid collection. Musculoskeletal: Similar degenerative change in the spine and pubic symphysis. There are no acute or suspicious osseous abnormalities. IMPRESSION: 1. Minor fat stranding about the proximal body of the pancreas, consistent with acute pancreatitis. Inflammatory changes less pronounced than on 23-Jul-2022 CT. No acute peripancreatic collection. 2.  Aortic Atherosclerosis (ICD10-I70.0). Aortic Atherosclerosis (ICD10-I70.0). Electronically Signed   By: Keith Rake M.D.   On: 11/11/2019 23:34    Micro Results     Recent Results (from the past 240 hour(s))  SARS Coronavirus 2 by RT PCR (hospital order, performed in [facility] hospital lab) Nasopharyngeal Nasopharyngeal Swab     Status: None   Collection Time: 11/12/19 12:32 AM   Specimen: Nasopharyngeal Swab  Result Value Ref Range Status   SARS Coronavirus 2 NEGATIVE NEGATIVE Final    Comment: (NOTE) SARS-CoV-2 target nucleic acids are NOT DETECTED. The SARS-CoV-2 RNA is generally detectable in upper and lower respiratory specimens during the acute phase of infection. The lowest concentration of SARS-CoV-2 viral copies this assay can detect is 250 copies / mL. A negative result does not preclude SARS-CoV-2 infection and should not be used as the sole basis for treatment or other patient management decisions.  A negative result may occur with improper specimen collection / handling, submission of specimen other than nasopharyngeal swab, presence of viral mutation(s) within the areas targeted by this assay, and inadequate number of viral copies (<250 copies / mL). A negative result must be combined with clinical observations, patient history, and epidemiological information. Fact  Sheet for Patients:   StrictlyIdeas.no Fact Sheet for Healthcare Providers: BankingDealers.co.za This test is not yet approved or cleared  by the [place] FDA and has been authorized for detection and/or diagnosis of SARS-CoV-2 by FDA under an Emergency Use Authorization (EUA).  This EUA will remain in effect (meaning this test  can be used) for the duration of the COVID-19 declaration under Section 564(b)(1) of the Act, 21 U.S.C. section 360bbb-3(b)(1), unless the authorization is terminated or revoked sooner. Performed at [facility], [place] [address] [place], [place] [address]   MRSA PCR Screening     Status: None   Collection Time: 11/12/19  3:46 AM   Specimen: Nasal Mucosa; Nasopharyngeal  Result Value Ref Range Status   MRSA by PCR NEGATIVE NEGATIVE Final    Comment:        The GeneXpert MRSA Assay (FDA approved for NASAL specimens only), is one component of a comprehensive MRSA colonization surveillance program. It is not intended to diagnose MRSA infection nor to guide or monitor treatment for MRSA infections. Performed at [facility], [place] [address] [place], [place] [address]     Today   Subjective    Ashley [name] today has no headache,no chest abdominal pain,no new weakness tingling or numbness, feels much better wants to go home today.    Objective   Blood pressure 123/69, pulse 61, temperature 99.1 F (37.3 C), temperature source Oral, resp. rate 18, height 5\' 2"  (1.575 m), weight 59.6 kg, SpO2 97 %.   Intake/Output Summary (Last 24 hours) at 11/15/2019 0909 Last data filed at 11/15/2019 0745 Gross per 24 hour  Intake 728.18 ml  Output 0 ml  Net 728.18 ml    Exam  Awake Alert, No new F.N deficits, Normal affect [place].AT,PERRAL Supple Neck,No JVD, No cervical lymphadenopathy appriciated.  Symmetrical Chest wall movement, Good air movement bilaterally, CTAB RRR,No Gallops,Rubs or  new Murmurs, No Parasternal Heave +ve B.Sounds, Abd Soft, Non tender, No organomegaly appriciated, No rebound -guarding or rigidity. No Cyanosis, Clubbing or edema, No new Rash or bruise   Data Review   CBC w Diff:  Lab Results  Component Value Date   WBC 7.4 11/15/2019   HGB 11.4 (L) 11/15/2019   HGB 13.1 04/02/2018   HCT 33.4 (L) 11/15/2019   HCT 38.0 04/02/2018   PLT 287 11/15/2019   PLT 312 04/02/2018   LYMPHOPCT 46 11/15/2019   MONOPCT 13 11/15/2019   EOSPCT 4 11/15/2019   BASOPCT 1 11/15/2019    CMP:  Lab Results  Component Value Date   NA 130 (L) 11/15/2019   NA 139 04/02/2018   K 4.6 11/15/2019   CL 87 (L) 11/15/2019   CO2 28 11/15/2019   BUN 40 (H) 11/15/2019   BUN 25 04/02/2018   CREATININE 10.26 (H) 11/15/2019   CREATININE 1.52 (H) 09/19/2014   PROT 6.5 11/15/2019   PROT 7.4 04/02/2018   ALBUMIN 2.9 (L) 11/15/2019   ALBUMIN 4.3 04/02/2018   BILITOT 0.2 (L) 11/15/2019   BILITOT 0.3 04/02/2018   ALKPHOS 85 11/15/2019   AST 14 (L) 11/15/2019   ALT 10 11/15/2019   ALT 18 04/06/2019  .   Total Time in preparing paper work, data evaluation and todays exam - 92 minutes  Lala Lund M.D on 11/15/2019 at 9:09 AM  [facility]   Office  [phone]

## 2019-11-15 NOTE — Progress Notes (Signed)
DISCHARGE NOTE HOME SRIJA SOUTHARD to be discharged Home per MD order. Discussed prescriptions and follow up appointments with the patient. Prescriptions given to patient; medication list explained in detail. Patient verbalized understanding.  Skin clean, dry and intact without evidence of skin break down, no evidence of skin tears noted. IV catheter discontinued intact. Site without signs and symptoms of complications. Dressing and pressure applied. Pt denies pain at the site currently. No complaints noted.  Patient free of lines, drains, and wounds.   An After Visit Summary (AVS) was printed and given to the patient. Patient escorted via wheelchair, and discharged home via private auto.  Aneta Mins BSN, RN3

## 2019-11-15 NOTE — Discharge Instructions (Signed)
Follow with Primary MD Sonia Side., FNP in 7 days   Get CBC, CMP   checked next visit within 1 week by Primary MD   Activity: As tolerated with Full fall precautions use walker/cane & assistance as needed  Disposition Home    Diet: Soft low-fat and low carbohydrate diet with 1.2 L fluid restriction per day.   Special Instructions: If you have smoked or chewed Tobacco  in the last 2 yrs please stop smoking, stop any regular Alcohol  and or any Recreational drug use.  On your next visit with your primary care physician please Get Medicines reviewed and adjusted.  Please request your Prim.MD to go over all Hospital Tests and Procedure/Radiological results at the follow up, please get all Hospital records sent to your Prim MD by signing hospital release before you go home.  If you experience worsening of your admission symptoms, develop shortness of breath, life threatening emergency, suicidal or homicidal thoughts you must seek medical attention immediately by calling 911 or calling your MD immediately  if symptoms less severe.  You Must read complete instructions/literature along with all the possible adverse reactions/side effects for all the Medicines you take and that have been prescribed to you. Take any new Medicines after you have completely understood and accpet all the possible adverse reactions/side effects.

## 2019-11-15 NOTE — Progress Notes (Signed)
Renal Navigator arranged [facility] for patient and walked her out to meet her ride. Patient will be taken straight to her HD clinic for treatment today. Patient states she is feeling well. Patient agreeable and appreciative of plan.  Alphonzo Cruise, [place] Renal Navigator [phone]

## 2019-11-16 ENCOUNTER — Telehealth: Payer: Self-pay | Admitting: Nurse Practitioner

## 2019-11-16 NOTE — Telephone Encounter (Signed)
Transition of care contact from inpatient facility  Date of Discharge: 11/15/2019 Date of Contact: 11/16/2019 Method of contact: Phone  Attempted to contact patient to discuss transition of care from inpatient admission.Telephone number listed in [place] and in [place] not a valid number.

## 2019-12-07 ENCOUNTER — Ambulatory Visit: Payer: Medicare Other | Admitting: Physician Assistant

## 2020-01-07 ENCOUNTER — Emergency Department (HOSPITAL_COMMUNITY): Payer: Medicare Other

## 2020-01-07 ENCOUNTER — Other Ambulatory Visit: Payer: Self-pay

## 2020-01-07 ENCOUNTER — Inpatient Hospital Stay (HOSPITAL_COMMUNITY)
Admission: EM | Admit: 2020-01-07 | Discharge: 2020-01-11 | DRG: 438 | Disposition: A | Discharge status: Home / Self Care | Payer: Medicare Other | Attending: Internal Medicine | Admitting: Internal Medicine

## 2020-01-07 ENCOUNTER — Encounter (HOSPITAL_COMMUNITY): Payer: Self-pay | Admitting: Emergency Medicine

## 2020-01-07 DIAGNOSIS — I120 Hypertensive chronic kidney disease with stage 5 chronic kidney disease or end stage renal disease: Secondary | ICD-10-CM | POA: Diagnosis present

## 2020-01-07 DIAGNOSIS — E875 Hyperkalemia: Secondary | ICD-10-CM | POA: Diagnosis present

## 2020-01-07 DIAGNOSIS — Z8249 Family history of ischemic heart disease and other diseases of the circulatory system: Secondary | ICD-10-CM

## 2020-01-07 DIAGNOSIS — E785 Hyperlipidemia, unspecified: Secondary | ICD-10-CM | POA: Diagnosis present

## 2020-01-07 DIAGNOSIS — Z992 Dependence on renal dialysis: Secondary | ICD-10-CM

## 2020-01-07 DIAGNOSIS — Z7682 Awaiting organ transplant status: Secondary | ICD-10-CM

## 2020-01-07 DIAGNOSIS — Z9115 Patient's noncompliance with renal dialysis: Secondary | ICD-10-CM

## 2020-01-07 DIAGNOSIS — K529 Noninfective gastroenteritis and colitis, unspecified: Secondary | ICD-10-CM

## 2020-01-07 DIAGNOSIS — K859 Acute pancreatitis without necrosis or infection, unspecified: Secondary | ICD-10-CM | POA: Diagnosis not present

## 2020-01-07 DIAGNOSIS — K5909 Other constipation: Secondary | ICD-10-CM | POA: Diagnosis present

## 2020-01-07 DIAGNOSIS — Z8711 Personal history of peptic ulcer disease: Secondary | ICD-10-CM

## 2020-01-07 DIAGNOSIS — Z809 Family history of malignant neoplasm, unspecified: Secondary | ICD-10-CM

## 2020-01-07 DIAGNOSIS — Z83438 Family history of other disorder of lipoprotein metabolism and other lipidemia: Secondary | ICD-10-CM

## 2020-01-07 DIAGNOSIS — R1013 Epigastric pain: Secondary | ICD-10-CM

## 2020-01-07 DIAGNOSIS — K219 Gastro-esophageal reflux disease without esophagitis: Secondary | ICD-10-CM | POA: Diagnosis present

## 2020-01-07 DIAGNOSIS — D631 Anemia in chronic kidney disease: Secondary | ICD-10-CM | POA: Diagnosis present

## 2020-01-07 DIAGNOSIS — N186 End stage renal disease: Secondary | ICD-10-CM | POA: Diagnosis present

## 2020-01-07 DIAGNOSIS — Z79899 Other long term (current) drug therapy: Secondary | ICD-10-CM

## 2020-01-07 DIAGNOSIS — E8770 Fluid overload, unspecified: Secondary | ICD-10-CM | POA: Diagnosis present

## 2020-01-07 DIAGNOSIS — F1290 Cannabis use, unspecified, uncomplicated: Secondary | ICD-10-CM | POA: Diagnosis present

## 2020-01-07 DIAGNOSIS — Z90710 Acquired absence of both cervix and uterus: Secondary | ICD-10-CM

## 2020-01-07 DIAGNOSIS — N2581 Secondary hyperparathyroidism of renal origin: Secondary | ICD-10-CM | POA: Diagnosis present

## 2020-01-07 DIAGNOSIS — Z8673 Personal history of transient ischemic attack (TIA), and cerebral infarction without residual deficits: Secondary | ICD-10-CM

## 2020-01-07 DIAGNOSIS — F17210 Nicotine dependence, cigarettes, uncomplicated: Secondary | ICD-10-CM | POA: Diagnosis present

## 2020-01-07 DIAGNOSIS — E871 Hypo-osmolality and hyponatremia: Secondary | ICD-10-CM | POA: Diagnosis present

## 2020-01-07 DIAGNOSIS — Z20822 Contact with and (suspected) exposure to covid-19: Secondary | ICD-10-CM | POA: Diagnosis present

## 2020-01-07 DIAGNOSIS — Z9114 Patient's other noncompliance with medication regimen: Secondary | ICD-10-CM

## 2020-01-07 DIAGNOSIS — B182 Chronic viral hepatitis C: Secondary | ICD-10-CM | POA: Diagnosis present

## 2020-01-07 LAB — CBC
HCT: 38.6 % (ref 36.0–46.0)
Hemoglobin: 12.7 g/dL (ref 12.0–15.0)
MCH: 31.6 pg (ref 26.0–34.0)
MCHC: 32.9 g/dL (ref 30.0–36.0)
MCV: 96 fL (ref 80.0–100.0)
Platelets: 288 10*3/uL (ref 150–400)
RBC: 4.02 MIL/uL (ref 3.87–5.11)
RDW: 13.8 % (ref 11.5–15.5)
WBC: 9 10*3/uL (ref 4.0–10.5)
nRBC: 0 % (ref 0.0–0.2)

## 2020-01-07 LAB — SARS CORONAVIRUS 2 BY RT PCR (HOSPITAL ORDER, PERFORMED IN ~~LOC~~ HOSPITAL LAB): SARS Coronavirus 2: NEGATIVE

## 2020-01-07 LAB — COMPREHENSIVE METABOLIC PANEL
ALT: 11 U/L (ref 0–44)
AST: 16 U/L (ref 15–41)
Albumin: 3.6 g/dL (ref 3.5–5.0)
Alkaline Phosphatase: 110 U/L (ref 38–126)
Anion gap: 17 — ABNORMAL HIGH (ref 5–15)
BUN: 52 mg/dL — ABNORMAL HIGH (ref 8–23)
CO2: 22 mmol/L (ref 22–32)
Calcium: 9.2 mg/dL (ref 8.9–10.3)
Chloride: 88 mmol/L — ABNORMAL LOW (ref 98–111)
Creatinine, Ser: 13.26 mg/dL — ABNORMAL HIGH (ref 0.44–1.00)
GFR calc Af Amer: 3 mL/min — ABNORMAL LOW (ref >60)
GFR calc non Af Amer: 2 mL/min — ABNORMAL LOW (ref >60)
Glucose, Bld: 128 mg/dL — ABNORMAL HIGH (ref 70–99)
Potassium: 5.1 mmol/L (ref 3.5–5.1)
Sodium: 127 mmol/L — ABNORMAL LOW (ref 135–145)
Total Bilirubin: 0.5 mg/dL (ref 0.3–1.2)
Total Protein: 7.6 g/dL (ref 6.5–8.1)

## 2020-01-07 LAB — LIPASE, BLOOD: Lipase: 69 U/L — ABNORMAL HIGH (ref 11–51)

## 2020-01-07 LAB — MRSA PCR SCREENING: MRSA by PCR: NEGATIVE

## 2020-01-07 MED ORDER — ONDANSETRON HCL 4 MG/2ML IJ SOLN
4.0000 mg | Freq: Four times a day (QID) | INTRAMUSCULAR | Status: DC | PRN
  Administered 2020-01-07: 4 mg via INTRAVENOUS
  Filled 2020-01-07: qty 2

## 2020-01-07 MED ORDER — SORBITOL 70 % SOLN
30.0000 mL | Freq: Every day | Status: DC | PRN
  Filled 2020-01-07: qty 30

## 2020-01-07 MED ORDER — CLONIDINE HCL 0.1 MG PO TABS
0.1000 mg | ORAL_TABLET | Freq: Two times a day (BID) | ORAL | Status: DC
  Administered 2020-01-07 – 2020-01-11 (×6): 0.1 mg via ORAL
  Filled 2020-01-07 (×7): qty 1

## 2020-01-07 MED ORDER — NEPRO/CARBSTEADY PO LIQD
237.0000 mL | Freq: Two times a day (BID) | ORAL | Status: DC | PRN
  Filled 2020-01-07: qty 237

## 2020-01-07 MED ORDER — POLYETHYLENE GLYCOL 3350 17 G PO PACK: 17.0000 g | PACK | Freq: Two times a day (BID) | ORAL | Status: DC | PRN

## 2020-01-07 MED ORDER — PREGABALIN 75 MG PO CAPS: 75.0000 mg | ORAL_CAPSULE | Freq: Two times a day (BID) | ORAL | Status: DC | PRN

## 2020-01-07 MED ORDER — ONDANSETRON HCL 4 MG/2ML IJ SOLN
4.0000 mg | Freq: Once | INTRAMUSCULAR | Status: AC
  Administered 2020-01-07: 4 mg via INTRAVENOUS
  Filled 2020-01-07: qty 2

## 2020-01-07 MED ORDER — HEPARIN SODIUM (PORCINE) 5000 UNIT/ML IJ SOLN
5000.0000 [IU] | Freq: Three times a day (TID) | INTRAMUSCULAR | Status: DC
  Administered 2020-01-07 – 2020-01-11 (×11): 5000 [IU] via SUBCUTANEOUS
  Filled 2020-01-07 (×12): qty 1

## 2020-01-07 MED ORDER — IOHEXOL 300 MG/ML  SOLN
100.0000 mL | Freq: Once | INTRAMUSCULAR | Status: AC | PRN
  Administered 2020-01-07: 100 mL via INTRAVENOUS

## 2020-01-07 MED ORDER — ONDANSETRON HCL 4 MG PO TABS: 4.0000 mg | ORAL_TABLET | Freq: Four times a day (QID) | ORAL | Status: DC | PRN

## 2020-01-07 MED ORDER — AMLODIPINE BESYLATE 10 MG PO TABS
10.0000 mg | ORAL_TABLET | Freq: Every day | ORAL | Status: DC
  Administered 2020-01-09 – 2020-01-11 (×3): 10 mg via ORAL
  Filled 2020-01-07 (×3): qty 1

## 2020-01-07 MED ORDER — ACETAMINOPHEN 325 MG PO TABS
650.0000 mg | ORAL_TABLET | Freq: Four times a day (QID) | ORAL | Status: DC | PRN
  Administered 2020-01-10 – 2020-01-11 (×2): 650 mg via ORAL
  Filled 2020-01-07 (×2): qty 2

## 2020-01-07 MED ORDER — FAMOTIDINE IN NACL 20-0.9 MG/50ML-% IV SOLN
20.0000 mg | Freq: Once | INTRAVENOUS | Status: AC
  Administered 2020-01-07: 20 mg via INTRAVENOUS
  Filled 2020-01-07: qty 50

## 2020-01-07 MED ORDER — BISACODYL 10 MG RE SUPP
10.0000 mg | Freq: Once | RECTAL | Status: DC
  Filled 2020-01-07: qty 1

## 2020-01-07 MED ORDER — ACETAMINOPHEN 650 MG RE SUPP: 650.0000 mg | Freq: Four times a day (QID) | RECTAL | Status: DC | PRN

## 2020-01-07 MED ORDER — FENTANYL CITRATE (PF) 100 MCG/2ML IJ SOLN
50.0000 ug | Freq: Once | INTRAMUSCULAR | Status: AC
  Administered 2020-01-07: 50 ug via INTRAVENOUS
  Filled 2020-01-07: qty 2

## 2020-01-07 MED ORDER — OXYCODONE HCL 5 MG PO TABS
5.0000 mg | ORAL_TABLET | ORAL | Status: DC | PRN
  Administered 2020-01-07 – 2020-01-11 (×13): 5 mg via ORAL
  Filled 2020-01-07 (×13): qty 1

## 2020-01-07 MED ORDER — HYDROMORPHONE HCL 1 MG/ML IJ SOLN
0.5000 mg | INTRAMUSCULAR | Status: DC | PRN
  Administered 2020-01-07 – 2020-01-11 (×6): 1 mg via INTRAVENOUS
  Filled 2020-01-07 (×6): qty 1

## 2020-01-07 MED ORDER — LIDOCAINE-PRILOCAINE 2.5-2.5 % EX CREA
1.0000 "application " | TOPICAL_CREAM | CUTANEOUS | Status: DC
  Filled 2020-01-07: qty 5

## 2020-01-07 MED ORDER — RENA-VITE PO TABS
1.0000 | ORAL_TABLET | Freq: Every day | ORAL | Status: DC
  Administered 2020-01-07 – 2020-01-11 (×5): 1 via ORAL
  Filled 2020-01-07 (×5): qty 1

## 2020-01-07 MED ORDER — SODIUM CHLORIDE 0.9% FLUSH
3.0000 mL | Freq: Once | INTRAVENOUS | Status: AC
  Administered 2020-01-07: 3 mL via INTRAVENOUS

## 2020-01-07 MED ORDER — CINACALCET HCL 30 MG PO TABS
30.0000 mg | ORAL_TABLET | Freq: Every day | ORAL | Status: DC
  Administered 2020-01-07: 30 mg via ORAL
  Filled 2020-01-07: qty 1

## 2020-01-07 MED ORDER — CHLORHEXIDINE GLUCONATE CLOTH 2 % EX PADS
6.0000 | MEDICATED_PAD | Freq: Every day | CUTANEOUS | Status: DC
  Administered 2020-01-08 – 2020-01-10 (×2): 6 via TOPICAL

## 2020-01-07 MED ORDER — FERRIC CITRATE 1 GM 210 MG(FE) PO TABS
420.0000 mg | ORAL_TABLET | Freq: Three times a day (TID) | ORAL | Status: DC
  Administered 2020-01-07: 420 mg via ORAL
  Filled 2020-01-07 (×2): qty 2

## 2020-01-07 NOTE — ED Notes (Addendum)
Called to give report, nurse unavailable at this time. Care handoff given to [facility], [place]
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
## 2020-01-07 NOTE — H&P (Signed)
History and Physical    Ashley [name] XIP:[id number] DOB: 10/13/1950 DOA: 01/07/2020  PCP: Sonia Side., FNP (Confirm with patient/family/NH records and if not entered, this has to be entered at [facility] point of entry) Patient coming from: Home  I have personally briefly reviewed patient's old medical records in [place]  Chief Complaint: Abd pain  HPI: Ashley [name] is a 69 y.o. female with medical history significant of recurrent pancreatitis (with a rather normal MRCP January 2021, and IgG4 negative in June 2021, and normal triglyceride 2021), ESRD on HD TTS, GERD, HTN, HLD, presented with on and off abdominal pain.  Symptoms started 5 days ago, on and off, cramping like, happens every time patient tries to eat something, last for 30-60 min, nonradiating, denied any fever chills no diarrhea.  Patient is chronically constipated move her bowels every 3 to 4 days and the last bowel movement was 3 days ago.  She also described the pain as similar pain she had during previous episode of pancreatitis.  She is been feeling sick for last 3 days and missed her Thursday dialysis.  She denied any shortness of breath, no chest pains. ED Course: Lipase 67, CT abdomen showed: Mild prominence of the pancreatic duct towards the level of the body with tapering at the level of the head is similar to comparison MRCP without discernible lesion on either examination.  Sodium 127, potassium 5.1, creatinine 13.2, BUN 52, glucose 128.  Patient failed oral challenge in ED.  Review of Systems: As per HPI otherwise 10 point review of systems negative.    Past Medical History:  Diagnosis Date   Acute pancreatitis 2000   2000, 12/2018, 08/2019   Arthritis    Cervical radiculopathy 02/28/2011   Cocaine substance abuse ([place]) 05/26/2013   positive UDS    Duodenitis    Erosive gastropathy    ESRD on hemodialysis (HCC)    TTS   GERD (gastroesophageal reflux disease)    Hepatitis C 1987   dt hx IVDA.   genotype 2B.  Epclusa started early 04/2020.     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  Chronic microvascular ischemic changes w parenchymal volume loss. Chronic white matter periventricular microhemorrhage, likely due to htn   Ulcer 1990   gastric ulcer. Ruptured s/p emergency repair    Past Surgical History:  Procedure Laterality Date   ABDOMINAL HYSTERECTOMY  1979   AV FISTULA PLACEMENT Left 06/16/2017   Procedure: ARTERIOVENOUS (AV) FISTULA CREATION LEFT ARM;  Surgeon: Conrad Rose Creek, MD;  Location: [facility];  Service: Vascular;  Laterality: Left;   [place] Left 10/02/2017   Procedure: BASILIC VEIN TRANSPOSITION SECOND STAGE LEFT ARM;  Surgeon: Rosetta Posner, MD;  Location: [place];  Service: Vascular;  Laterality: Left;   BIOPSY  09/06/2019   Procedure: BIOPSY;  Surgeon: Gatha Mayer, MD;  Location: [facility] ENDOSCOPY;  Service: Endoscopy;;   ESOPHAGOGASTRODUODENOSCOPY N/A 05/29/2013   Procedure: ESOPHAGOGASTRODUODENOSCOPY (EGD);  Surgeon: Jerene Bears, MD;  Location[facility] ENDOSCOPY;  Service: Endoscopy;  Laterality: N/A;   ESOPHAGOGASTRODUODENOSCOPY  05/2013   for epigastric pain.  Nonobstructing Schatzki ring at GEJ, mild gastropathy, nonbleeding AVMs in bulb and D2.  5 mm sessile polyp in bulb.   ESOPHAGOGASTRODUODENOSCOPY (EGD) WITH PROPOFOL N/A 09/06/2019   Procedure: ESOPHAGOGASTRODUODENOSCOPY (EGD) WITH PROPOFOL;  Surgeon: Gatha Mayer, MD;  Location: [place];  Service: Endoscopy;  Laterality: N/A;   EXCHANGE OF A DIALYSIS CATHETER Left 07/31/2017   Procedure: Removal  OF A  Right GroinTUNNELED  DIALYSIS CATHETER ,  Insertion of Left Femoral Dialysis Catheter.;  Surgeon: Rosetta Posner, MD;   Location: [facility] OR;  Service: Vascular;  Laterality: Left;   HOT HEMOSTASIS N/A 09/06/2019   Procedure: HOT HEMOSTASIS (ARGON PLASMA COAGULATION/BICAP);  Surgeon: Gatha Mayer, MD;  Location[facility] ENDOSCOPY;  Service: Endoscopy;  Laterality: N/A;   INSERTION OF DIALYSIS CATHETER Right 06/16/2017   Procedure: INSERTION OF DIALYSIS CATHETER;  Surgeon: Conrad Willow Hill, MD;  Location: [place];  Service: Vascular;  Laterality: Right;   IR AV DIALY SHUNT INTRO NEEDLE/INTRACATH INITIAL W/PTA/IMG LEFT  06/21/2018   REPAIR OF PERFORATED ULCER  1990   gastric ulcer     reports that she has been smoking cigarettes. She has a 10.00 pack-year smoking history. She has never used smokeless tobacco. She reports previous drug use. Drugs: Heroin, Marijuana, and Cocaine. She reports that she does not drink alcohol.  Allergies  Allergen Reactions   Aspirin Nausea And Vomiting    Stomach ache   Ibuprofen Nausea And Vomiting    Stomach ache    Family History  Problem Relation Age of Onset   Hypertension Father    Cancer Father    Hyperlipidemia Father    Seizures Sister    Early death Daughter    Kidney disease Daughter        end stage dialysis dependent      Prior to Admission medications   Medication Sig Start Date End Date Taking? Authorizing Provider  amLODipine (NORVASC) 10 MG tablet Take 1 tablet (10 mg total) by mouth daily. Patient taking differently: Take 10 mg by mouth daily at 12 noon.  11/11/16  Yes Phelps, Aviva Signs Y, DO  B Complex-C-Zn-Folic Acid (DIALYVITE 932 WITH ZINC) 0.8 MG TABS Take 1 tablet by mouth daily.  07/07/19  Yes [provider]  cinacalcet (SENSIPAR) 30 MG tablet Take 30 mg by mouth at bedtime.    Yes [provider]  cloNIDine (CATAPRES) 0.1 MG tablet Take 0.1 mg by mouth 2 (two) times daily. 06/25/19  Yes [provider]  ferric citrate (AURYXIA) 1 GM 210 MG(Fe) tablet Take 420 mg by mouth 3 (three) times daily with meals.    Yes [provider]  lidocaine-prilocaine (EMLA) cream Apply 1 application topically See admin instructions. Apply small amount to access site on Tuesday, Thursday, Saturday one hour before dialysis. Cover with occlusive dressing (saran wrap) 08/25/19  Yes [provider]  Nutritional Supplements (FEEDING SUPPLEMENT, NEPRO CARB STEADY,) LIQD Take 237 mLs by mouth 3 (three) times daily as needed (Supplement). Patient taking differently: Take 237 mLs by mouth 2 (two) times daily as needed (meal supplement).  07/11/19  Yes Black, Lezlie Octave, NP  pregabalin (LYRICA) 75 MG capsule Take 75 mg by mouth 2 (two) times daily as needed (nerve pain).  11/28/19  Yes [provider]  sorbitol 70 % SOLN Take 30 mLs by mouth daily as needed (constipation).  11/29/19  Yes [provider]  metoprolol succinate (TOPROL-XL) 25 MG 24 hr tablet Take 1 tablet (25 mg total) by mouth daily. Patient not taking: Reported on 11/12/2019 09/11/19   Caren Griffins, MD  polyethylene glycol (MIRALAX) 17 g packet Take 17 g by mouth 2 (two) times daily. Patient not taking: Reported on 01/07/2020 09/14/19   Delia Heady, PA-C    Physical Exam: Vitals:   01/07/20 0554 01/07/20 0847 01/07/20 1058 01/07/20 1455  BP: (!) 140/81 (!) 146/68 (!) 135/66 (!) 142/67  Pulse: 69 69 62 59  Resp: 16 16 18 12   Temp:      TempSrc:      SpO2: 97% 98% 99% 95%  Weight:        Constitutional: NAD, calm, comfortable Vitals:   01/07/20 0554 01/07/20 0847 01/07/20 1058 01/07/20 1455  BP: (!) 140/81 (!) 146/68 (!) 135/66 (!) 142/67  Pulse: 69 69 62 59  Resp: 16 16 18 12   Temp:      TempSrc:      SpO2: 97% 98% 99% 95%  Weight:       Eyes: PERRL, lids and conjunctivae normal ENMT: Mucous membranes are moist. Posterior pharynx clear of any exudate or lesions.Normal dentition.  Neck: normal, supple, no masses, no thyromegaly Respiratory: clear to auscultation bilaterally, no wheezing, no crackles. Normal respiratory effort. No  accessory muscle use.  Cardiovascular: Regular rate and rhythm, no murmurs / rubs / gallops. No extremity edema. 2+ pedal pulses. No carotid bruits.  Abdomen: Mild tenderness on epigastric area no rebound no guarding. No hepatosplenomegaly. Bowel sounds positive.  Musculoskeletal: no clubbing / cyanosis. No joint deformity upper and lower extremities. Good ROM, no contractures. Normal muscle tone.  Skin: no rashes, lesions, ulcers. No induration Neurologic: CN 2-12 grossly intact. Sensation intact, DTR normal. Strength 5/5 in all 4.  Psychiatric: Normal judgment and insight. Alert and oriented x 3. Normal mood.     Labs on Admission: I have personally reviewed following labs and imaging studies  CBC: Recent Labs  Lab 01/07/20 0502  WBC 9.0  HGB 12.7  HCT 38.6  MCV 96.0  PLT 101   Basic Metabolic Panel: Recent Labs  Lab 01/07/20 0502  NA 127*  K 5.1  CL 88*  CO2 22  GLUCOSE 128*  BUN 52*  CREATININE 13.26*  CALCIUM 9.2   GFR: Estimated Creatinine Clearance: 3.1 mL/min (A) (by C-G formula based on SCr of 13.26 mg/dL (H)). Liver Function Tests: Recent Labs  Lab 01/07/20 0502  AST 16  ALT 11  ALKPHOS 110  BILITOT 0.5  PROT 7.6  ALBUMIN 3.6   Recent Labs  Lab 01/07/20 0502  LIPASE 69*   No results for input(s): AMMONIA in the last 168 hours. Coagulation Profile: No results for input(s): INR, PROTIME in the last 168 hours. Cardiac Enzymes: No results for input(s): CKTOTAL, CKMB, CKMBINDEX, TROPONINI in the last 168 hours. BNP (last 3 results) No results for input(s): PROBNP in the last 8760 hours. HbA1C: No results for input(s): HGBA1C in the last 72 hours. CBG: No results for input(s): GLUCAP in the last 168 hours. Lipid Profile: No results for input(s): CHOL, HDL, LDLCALC, TRIG, CHOLHDL, LDLDIRECT in the last 72 hours. Thyroid Function Tests: No results for input(s): TSH, T4TOTAL, FREET4, T3FREE, THYROIDAB in the last 72 hours. Anemia Panel: No results  for input(s): VITAMINB12, FOLATE, FERRITIN, TIBC, IRON, RETICCTPCT in the last 72 hours. Urine analysis:    Component Value Date/Time   COLORURINE YELLOW 12/31/2018 1014   APPEARANCEUR CLEAR 12/31/2018 1014   LABSPEC 1.008 12/31/2018 1014   PHURINE 9.0 (H) 12/31/2018 1014   GLUCOSEU NEGATIVE 12/31/2018 1014   HGBUR SMALL (A) 12/31/2018 1014   BILIRUBINUR  NEGATIVE 12/31/2018 1014   BILIRUBINUR NEG 01/29/2016 1142   [place] 12/31/2018 1014   PROTEINUR >=300 (A) 12/31/2018 1014   UROBILINOGEN 0.2 01/29/2016 1142   UROBILINOGEN 0.2 03/21/2015 1649   NITRITE NEGATIVE 12/31/2018 1014   LEUKOCYTESUR NEGATIVE 12/31/2018 1014    Radiological Exams on Admission: CT ABDOMEN PELVIS W CONTRAST  Result Date: 01/07/2020 CLINICAL DATA:  Constipation, abdominal pain EXAM: CT ABDOMEN AND PELVIS WITH CONTRAST TECHNIQUE: Multidetector CT imaging of the abdomen and pelvis was performed using the standard protocol following bolus administration of intravenous contrast. CONTRAST:  133mL OMNIPAQUE IOHEXOL 300 MG/ML  SOLN COMPARISON:  CT 11/11/2019, MRCP 07/09/2019 FINDINGS: Lower chest: Lung bases are clear. Normal heart size. No pericardial effusion. Atherosclerotic calcifications of the coronaries. Hepatobiliary: Some mild heterogeneity of the liver enhancement likely related to contrast timing. No focal concerning lesions. Smooth surface contour. Normal gallbladder and biliary tree without visible calcified gallstone. Pancreas: Mild prominence of the pancreatic duct towards the level of the body with tapering at the level of the head is similar to comparison MRCP without discernible lesion on either examination. No peripancreatic inflammation. Spleen: Normal splenic size. A subcentimeter hypoattenuating focus in the anterior spleen too small to fully characterize on CT imaging but statistically likely benign. Small accessory splenule. Adrenals/Urinary Tract: Lobular thickening of the adrenal glands,  likely senescent hyperplasia. No concerning nodules. Kidneys enhance symmetrically with some mildly delayed excretion. Numerous subcentimeter hypoattenuating foci too small to fully characterize on CT imaging but statistically likely benign. No worrisome renal lesions. No urolithiasis or hydronephrosis. Urinary bladder is largely decompressed at the time of exam and therefore poorly evaluated by CT imaging. Mild wall thickening, nonspecific, and perivesicular haze Stomach/Bowel: Distal esophagus, stomach and duodenal sweep are unremarkable. No small bowel wall thickening or dilatation. No evidence of obstruction. A normal appendix is visualized. Moderate colonic stool burden. Mild proximal colonic mural thickening with mucosal hyperemia. Vascular/Lymphatic: Atherosclerotic calcifications within the abdominal aorta and branch vessels. No aneurysm or ectasia. No enlarged abdominopelvic lymph nodes. Reproductive: Uterus is surgically absent. No concerning adnexal lesions. Other: No abdominopelvic free fluid or free gas. No bowel containing hernias. Musculoskeletal: No acute osseous abnormality or suspicious osseous lesion. Multilevel degenerative changes are present in the imaged portions of the spine. Transitional lumbosacral anatomy. Degenerative changes at the pubic symphysis, may reflect sequela of prior osteitis pubis, similar to prior. IMPRESSION: 1. Mild proximal colonic mural thickening with mucosal hyperemia may represent a mild colitis. Moderate colonic stool burden without evidence of obstruction. 2. Urinary bladder wall thickening versus underdistention. Correlate with urinary symptoms and consider urinalysis to exclude cystitis if present. 3. Stable mild prominence of the pancreatic duct, similar to comparison MRCP. 4. Aortic Atherosclerosis (ICD10-I70.0). Electronically Signed   By: Lovena Le M.D.   On: 01/07/2020 15:06   DG Chest Port 1 View  Result Date: 01/07/2020 CLINICAL DATA:  Shortness of  breath, constipated for 4 days EXAM: PORTABLE CHEST 1 VIEW COMPARISON:  07/18/2019, 12/15/2018 FINDINGS: No consolidation, features of edema, pneumothorax, or effusion. Pulmonary vascularity is normally distributed. The cardiomediastinal contours are unremarkable. No acute osseous or soft tissue abnormality. Left axillary surgical clips and vascular stent similar to prior. Degenerative changes are present in the imaged spine and shoulders. IMPRESSION: No acute cardiopulmonary abnormality. Electronically Signed   By: Lovena Le M.D.   On: 01/07/2020 14:59    EKG: Ordered  Assessment/Plan Active Problems:   Pancreatitis  (please populate well all problems here in Problem List. (For example,  if patient is on BP meds at home and you resume or decide to hold them, it is a problem that needs to be her. Same for CAD, COPD, HLD and so on)  Intractable abdominal pain -Likely another bout of acute pancreatitis. MRCP January 2021 showed no structural abnormality of pancreas, and IgG4 negative in June 2021, and normal triglyceride 2021.  GI recommended EGD and EUS in June, which still probably be done as outpatient. -Clear liquid for now, supportive care with pain control and Zofran as needed  Constipation -Ordered Dulcolax suppository  ESRD on HD -Discussed with on-call nephrologist, scheduled dialysis for tomorrow  Acute on chronic hyponatremia -Likely from her chronic kidney problems, dialysis as per nephrologist  HTN -Resume home regimen  DVT prophylaxis: Subcu heparin  code Status: Full Family Communication: None at bedside Disposition Plan: Likely can be discharged home in next 24 to 48 hours if GI symptoms improve Consults called: Nephrology Admission status: [place] Admit   Lequita Halt MD [facility] Pager [phone]  01/07/2020, 4:07 PM

## 2020-01-07 NOTE — Consult Note (Signed)
ESRD Consult Note [facility]  Requesting provider: Lequita Halt, MD Reason for consult: ESRD, provision of dialysis  Outpatient dialysis unit: [facility] Outpatient dialysis schedule: TTS  Assessment/Recommendations: Ashley [name] is a 69 y.o. female with a past medical history notable for ESRD on HD admitted with abdominal pain secondary to recurrent pancreatitis.   1. Abdominal pain, secondary to acute pancreatitis, etiology unclear -mgmt per primary, CL diet, pain and nausea control -secondary causes being investigated, open for EGD/EUS (outpatient)  2. ESRD:  -outpatient dialysis rx: 3.5 hrs, F160, BFR350, DFR 800, 2K, 2.25Ca, 137Na, 35Bicarb. UF profile #4 -heparin 2k units bolus, heparin 1k mid-run bolus -discussed with dialysis staff, will plan for dialysis tomorrow 01/08/2020, will likely provide truncated treatment time. Thereafter, will be back on her normal TTS schedule this coming Tuesday  3. Volume/ hypertension: EDW 59.5kg. Attempt to achieve EDW as tolerated -resume home anti-htns  4. Anemia of Chronic Kidney Disease: Hemoglobin 12.7 (corresponds with outpatient labs). Currently not on ESAs/iron.   5. Secondary Hyperparathyroidism/Hyperphosphatemia: hectorol 60mcg qtreatment   6. Vascular access: left upper extremity avf +B/T  7. Hyponatremia -likely secondary to low solute intake along with pain. Hopeful that it will correct with dialysis utilizing a 137Na bath  Additional recommendations: - Dose all meds for creatinine clearance < 10 ml/min  - Unless absolutely necessary, no MRIs with gadolinium.  - Implement save arm precautions.  Prefer needle sticks in the dorsum of the hands or wrists.  No blood pressure measurements in arm. - If blood transfusion is requested during hemodialysis sessions, please alert [place] prior to the session.  - If a hemodialysis catheter line culture is requested, please alert [place] as only hemodialysis nurses are able to collect  those specimens.   Recommendations were discussed with the primary team.  Gean Quint, MD [facility]  History of Present Illness: Ashley [name] is a/an 69 y.o. female with a past medical history of ESRD on HD, recurrent pancreatitis (etiology still unclear), GERD, HTN, HLD, tobacco abuse, h/o polysubstance abuse, Hep C, h/o CVA who presents with abdominal pain x 5 days, intermittent. Occurs when trying to eat. Has chronic conspitation, last BM 3 days ago. She reports the pain is the same kind of pain she had during her previous admission. Last dialysis was on Tuesday (missed her Thursday dialysis due to not feeling well). Has also been having intermittent nausea/vomiting. Otherwise denies fevers, chest pain, sob, orthopnea.   Medications:  Current Facility-Administered Medications  Medication Dose Route Frequency Provider Last Rate Last Admin   acetaminophen (TYLENOL) tablet 650 mg  650 mg Oral Q6H PRN Wynetta Fines T, MD       Or   acetaminophen (TYLENOL) suppository 650 mg  650 mg Rectal Q6H PRN Lequita Halt, MD       bisacodyl (DULCOLAX) suppository 10 mg  10 mg Rectal Once Wynetta Fines T, MD       heparin injection 5,000 Units  5,000 Units Subcutaneous Q8H Wynetta Fines T, MD       HYDROmorphone (DILAUDID) injection 0.5-1 mg  0.5-1 mg Intravenous Q2H PRN Wynetta Fines T, MD       ondansetron [facility]) tablet 4 mg  4 mg Oral Q6H PRN Lequita Halt, MD       Or   ondansetron [facility]) injection 4 mg  4 mg Intravenous Q6H PRN Wynetta Fines T, MD       oxyCODONE (Oxy IR/ROXICODONE) immediate release tablet 5 mg  5 mg Oral Q4H PRN Lequita Halt, MD       Current Outpatient Medications  Medication Sig Dispense Refill   amLODipine (NORVASC) 10 MG tablet Take 1 tablet (10 mg total) by mouth daily. (Patient taking differently: Take 10 mg by mouth daily at 12 noon. ) 90 tablet 1   B Complex-C-Zn-Folic Acid (DIALYVITE 350 WITH ZINC) 0.8 MG TABS Take 1 tablet by mouth daily.       cinacalcet (SENSIPAR) 30 MG tablet Take 30 mg by mouth at bedtime.      cloNIDine (CATAPRES) 0.1 MG tablet Take 0.1 mg by mouth 2 (two) times daily.     ferric citrate (AURYXIA) 1 GM 210 MG(Fe) tablet Take 420 mg by mouth 3 (three) times daily with meals.      lidocaine-prilocaine (EMLA) cream Apply 1 application topically See admin instructions. Apply small amount to access site on Tuesday, Thursday, Saturday one hour before dialysis. Cover with occlusive dressing (saran wrap)     Nutritional Supplements (FEEDING SUPPLEMENT, NEPRO CARB STEADY,) LIQD Take 237 mLs by mouth 3 (three) times daily as needed (Supplement). (Patient taking differently: Take 237 mLs by mouth 2 (two) times daily as needed (meal supplement). )  0   pregabalin (LYRICA) 75 MG capsule Take 75 mg by mouth 2 (two) times daily as needed (nerve pain).      sorbitol 70 % SOLN Take 30 mLs by mouth daily as needed (constipation).      metoprolol succinate (TOPROL-XL) 25 MG 24 hr tablet Take 1 tablet (25 mg total) by mouth daily. (Patient not taking: Reported on 11/12/2019) 30 tablet 0   polyethylene glycol (MIRALAX) 17 g packet Take 17 g by mouth 2 (two) times daily. (Patient not taking: Reported on 01/07/2020) 14 each 0     ALLERGIES Aspirin and Ibuprofen  MEDICAL HISTORY Past Medical History:  Diagnosis Date   Acute pancreatitis 2000   2000, 12/2018, 08/2019   Arthritis    Cervical radiculopathy 02/28/2011   Cocaine substance abuse ([place]) 05/26/2013   positive UDS    Duodenitis    Erosive gastropathy    ESRD on hemodialysis (HCC)    TTS   GERD (gastroesophageal reflux disease)    Hepatitis C 1987   dt hx IVDA.  genotype 2B.  Epclusa started early 04/2020.     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  Chronic microvascular ischemic changes w parenchymal volume loss. Chronic white matter periventricular microhemorrhage, likely due to htn   Ulcer 1990   gastric ulcer. Ruptured s/p emergency repair     SOCIAL HISTORY Social History   Socioeconomic History   Marital status: Widowed    Spouse name: Not on file   Number of children: 3   Years of education: Not on file   Highest education level: Not on file  Occupational History   Occupation: retired  Tobacco Use   Smoking status: Current Every Day Smoker    Packs/day: 0.25    Years: 40.00    Pack years: 10.00    Types: Cigarettes   Smokeless tobacco: Never Used  Scientific laboratory technician Use: Never used  Substance and Sexual Activity   Alcohol use: No  Alcohol/week: 0.0 standard drinks   Drug use: Not Currently    Types: Heroin, Marijuana, Cocaine    Comment: hasn't used cocaine in 1-2 years; she smokes marijuana daily, "whenever I can get it"   Sexual activity: Never  Other Topics Concern   Not on file  Social History Narrative   Not on file   Social Determinants of Health   Financial Resource Strain:    Difficulty of Paying Living Expenses:   Food Insecurity:    Worried About Charity fundraiser in the Last Year:    Arboriculturist in the Last Year:   Transportation Needs:    Film/video editor (Medical):    Lack of Transportation (Non-Medical):   Physical Activity:    Days of Exercise per Week:    Minutes of Exercise per Session:   Stress:    Feeling of Stress :   Social Connections:    Frequency of Communication with Friends and Family:    Frequency of Social Gatherings with Friends and Family:    Attends Religious Services:    Active Member of Clubs or Organizations:    Attends Music therapist:    Marital Status:   Intimate Partner Violence:    Fear of Current or Ex-Partner:    Emotionally Abused:     Physically Abused:    Sexually Abused:      FAMILY HISTORY Family History  Problem Relation Age of Onset   Hypertension Father    Cancer Father    Hyperlipidemia Father    Seizures Sister    Early death Daughter    Kidney disease Daughter        end stage dialysis dependent      Review of Systems: 12 systems were reviewed and negative except per HPI  Physical Exam: Vitals:   01/07/20 1058 01/07/20 1455  BP: (!) 135/66 (!) 142/67  Pulse: 62 59  Resp: 18 12  Temp:    SpO2: 99% 95%   Total I/O In: 50 [IV Piggyback:50] Out: -   Intake/Output Summary (Last 24 hours) at 01/07/2020 1614 Last data filed at 01/07/2020 1533 Gross per 24 hour  Intake 50 ml  Output --  Net 50 ml   General: well-appearing, no acute distress, laying flat in bed HEENT: anicteric sclera, MMM CV: s1s2, normal rate, no murmurs/rubs/gallops Lungs: cta bl, no w/r/r/c, bilateral chest rise, normal wob Abd: soft, tender to palpation especially in upper quadrants, slightly distended Skin: no visible lesions or rashes MSK: no edema, LUE avf: c/d/i with good bruit/thrill Psych: alert, engaged, appropriate mood and affect Neuro: normal speech, no gross focal deficits, aaox3   Test Results Reviewed Lab Results  Component Value Date   NA 127 (L) 01/07/2020   K 5.1 01/07/2020   CL 88 (L) 01/07/2020   CO2 22 01/07/2020   BUN 52 (H) 01/07/2020   CREATININE 13.26 (H) 01/07/2020   CALCIUM 9.2 01/07/2020   ALBUMIN 3.6 01/07/2020   PHOS 5.6 (H) 11/14/2019    I have reviewed relevant outside healthcare records

## 2020-01-07 NOTE — Plan of Care (Signed)

## 2020-01-07 NOTE — ED Triage Notes (Signed)
Pt in w/generalized abdominal pain and distention, states constipated x 4 days. Does have some sob, and missed Thursday's dialysis session. Denies any cp, n/v/d. No BM x 4 days despite laxatives

## 2020-01-07 NOTE — ED Provider Notes (Signed)
Patient signed out to me awaiting CT scan abdomen pelvis.  History of renal disease on hemodialysis with recurrent pancreatitis recently as well as duodenitis/gastritis.  Patient with worsening abdominal pain over the last 4 to 5 days.  Not able to eat or drink anything without pain.  No fever, no white count.  Sodium 127.  Electrolytes otherwise unremarkable with creatinine of 13.  Lipase mildly elevated.  Patient with CT scan that shows proximal colonic thickening with mucosal hyperemia suggestive of a possible colitis.  Patient also with moderate stool burden but no evidence of obstruction.  Patient still with pain and nausea does not feel as if she can tolerate p.o.  She has missed last several days of dialysis and was.  A make-up schedule today she is also missed.  Patient to get another round of antiemetics and pain medicine but will call for admission and possible antibiotics.  This chart was dictated using voice recognition software.  Despite best efforts to proofread,  errors can occur which can change the documentation meaning.     Lennice Sites, DO 01/07/20 1527

## 2020-01-07 NOTE — ED Notes (Signed)
Nurse for 5N05 unable to take report at this time. This is the second attempt.

## 2020-01-08 DIAGNOSIS — Z7682 Awaiting organ transplant status: Secondary | ICD-10-CM | POA: Diagnosis not present

## 2020-01-08 DIAGNOSIS — K219 Gastro-esophageal reflux disease without esophagitis: Secondary | ICD-10-CM | POA: Diagnosis present

## 2020-01-08 DIAGNOSIS — Z79899 Other long term (current) drug therapy: Secondary | ICD-10-CM | POA: Diagnosis not present

## 2020-01-08 DIAGNOSIS — Z83438 Family history of other disorder of lipoprotein metabolism and other lipidemia: Secondary | ICD-10-CM | POA: Diagnosis not present

## 2020-01-08 DIAGNOSIS — E785 Hyperlipidemia, unspecified: Secondary | ICD-10-CM | POA: Diagnosis present

## 2020-01-08 DIAGNOSIS — Z9114 Patient's other noncompliance with medication regimen: Secondary | ICD-10-CM | POA: Diagnosis not present

## 2020-01-08 DIAGNOSIS — E8770 Fluid overload, unspecified: Secondary | ICD-10-CM | POA: Diagnosis present

## 2020-01-08 DIAGNOSIS — F17210 Nicotine dependence, cigarettes, uncomplicated: Secondary | ICD-10-CM | POA: Diagnosis present

## 2020-01-08 DIAGNOSIS — K5909 Other constipation: Secondary | ICD-10-CM | POA: Diagnosis present

## 2020-01-08 DIAGNOSIS — E871 Hypo-osmolality and hyponatremia: Secondary | ICD-10-CM | POA: Diagnosis present

## 2020-01-08 DIAGNOSIS — N186 End stage renal disease: Secondary | ICD-10-CM | POA: Diagnosis present

## 2020-01-08 DIAGNOSIS — K529 Noninfective gastroenteritis and colitis, unspecified: Secondary | ICD-10-CM

## 2020-01-08 DIAGNOSIS — Z8249 Family history of ischemic heart disease and other diseases of the circulatory system: Secondary | ICD-10-CM | POA: Diagnosis not present

## 2020-01-08 DIAGNOSIS — Z809 Family history of malignant neoplasm, unspecified: Secondary | ICD-10-CM | POA: Diagnosis not present

## 2020-01-08 DIAGNOSIS — E875 Hyperkalemia: Secondary | ICD-10-CM | POA: Diagnosis present

## 2020-01-08 DIAGNOSIS — Z20822 Contact with and (suspected) exposure to covid-19: Secondary | ICD-10-CM | POA: Diagnosis present

## 2020-01-08 DIAGNOSIS — I120 Hypertensive chronic kidney disease with stage 5 chronic kidney disease or end stage renal disease: Secondary | ICD-10-CM | POA: Diagnosis present

## 2020-01-08 DIAGNOSIS — Z9115 Patient's noncompliance with renal dialysis: Secondary | ICD-10-CM | POA: Diagnosis not present

## 2020-01-08 DIAGNOSIS — Z992 Dependence on renal dialysis: Secondary | ICD-10-CM | POA: Diagnosis not present

## 2020-01-08 DIAGNOSIS — Z90710 Acquired absence of both cervix and uterus: Secondary | ICD-10-CM | POA: Diagnosis not present

## 2020-01-08 DIAGNOSIS — K859 Acute pancreatitis without necrosis or infection, unspecified: Secondary | ICD-10-CM | POA: Diagnosis present

## 2020-01-08 DIAGNOSIS — D631 Anemia in chronic kidney disease: Secondary | ICD-10-CM | POA: Diagnosis present

## 2020-01-08 DIAGNOSIS — Z8711 Personal history of peptic ulcer disease: Secondary | ICD-10-CM | POA: Diagnosis not present

## 2020-01-08 DIAGNOSIS — N2581 Secondary hyperparathyroidism of renal origin: Secondary | ICD-10-CM | POA: Diagnosis present

## 2020-01-08 MED ORDER — SORBITOL 70 % SOLN
15.0000 mL | Freq: Every day | Status: DC
  Administered 2020-01-09: 15 mL via ORAL
  Filled 2020-01-08: qty 30

## 2020-01-08 MED ORDER — DOXERCALCIFEROL 4 MCG/2ML IV SOLN
2.0000 ug | INTRAVENOUS | Status: DC
  Filled 2020-01-08: qty 2

## 2020-01-08 MED ORDER — SENNOSIDES-DOCUSATE SODIUM 8.6-50 MG PO TABS
1.0000 | ORAL_TABLET | Freq: Every day | ORAL | Status: DC
  Administered 2020-01-08 – 2020-01-10 (×3): 1 via ORAL
  Filled 2020-01-08 (×3): qty 1

## 2020-01-08 NOTE — Progress Notes (Signed)
Patient refused AM labs, states "they already did it in the ED when I got here, they can do my labs in hemodialysis, I am a hard stick". RN will continue to monitor.

## 2020-01-08 NOTE — Progress Notes (Signed)
Pt to HD

## 2020-01-08 NOTE — Progress Notes (Signed)
Subjective:  BP is highish- planning for HD later today off schedule  (last done on Tuesday).  She says her belly is feeling a little better-  Wants to try and eat something Objective Vital signs in last 24 hours: Vitals:   01/07/20 1826 01/07/20 1926 01/07/20 2008 01/08/20 0342  BP: (!) 139/58 (!) 136/52 (!) 177/75 (!) 158/61  Pulse: 59 69 63 60  Resp: 18 18 16 16   Temp: 98.2 F (36.8 C) 98.3 F (36.8 C) 98.4 F (36.9 C) (!) 97.4 F (36.3 C)  TempSrc: Oral Oral Oral Oral  SpO2: 95% 95% 95% 92%  Weight:   59.6 kg   Height:   5\' 2"  (1.575 m)    Weight change: 0 kg  Intake/Output Summary (Last 24 hours) at 01/08/2020 4315 Last data filed at 01/08/2020 0600 Gross per 24 hour  Intake 290 ml  Output 1 ml  Net 289 ml   HD at [place] TTS  3:30 - 160 dialyzer- BFR 350/DFR 800/EDW 59.5- 2K, 2.25 calc Profile #4 -  AVF left -  Heparin - yes- hectorol 2 mcg Last hgb 12.5- no ESA-  Calc 10.1, phos 3.9   Assessment/ Plan: Pt is a 69 y.o. yo female with ESRD who was admitted on 01/07/2020 with abdominal pain-  Possibly pancreatitis, missed HD  Assessment/Plan: 1. Abdominal pain-  Lipase up some-  CT showing proximal colonic mural thickening, possibly colitis, also moderate stool burden and urinary wall thickening and mild prominence of the pancreatic duct-  She says her sxms are better today, trying to advance diet. All per primary team 2. ESRD - normally TTS-  No treatment since Tuesday b/c was having this issue-  Planning for short treatment today off schedule then plan next for Tuesday.   3. Hyperkalemia-  Due to missed HD- should correct with HD 4. Hyponatremia- due to missed HD-  Should correct  3. Anemia- not on ESA as OP- hgb over 12  4. Secondary hyperparathyroidism- will cont hectorol-  Hold auryxia  and sensipar since having GI issues  5. HTN/volume- BP if anything is high-  Low sodium argues for volume overload-  Is at EDW but likely has lost weight-  Will try for 2 liters UF today -   Continue norvasc  Lakeith Careaga A Cephus Tupy    Labs: Basic Metabolic Panel: Recent Labs  Lab 01/07/20 0502  NA 127*  K 5.1  CL 88*  CO2 22  GLUCOSE 128*  BUN 52*  CREATININE 13.26*  CALCIUM 9.2   Liver Function Tests: Recent Labs  Lab 01/07/20 0502  AST 16  ALT 11  ALKPHOS 110  BILITOT 0.5  PROT 7.6  ALBUMIN 3.6   Recent Labs  Lab 01/07/20 0502  LIPASE 69*   No results for input(s): AMMONIA in the last 168 hours. CBC: Recent Labs  Lab 01/07/20 0502  WBC 9.0  HGB 12.7  HCT 38.6  MCV 96.0  PLT 288   Cardiac Enzymes: No results for input(s): CKTOTAL, CKMB, CKMBINDEX, TROPONINI in the last 168 hours. CBG: No results for input(s): GLUCAP in the last 168 hours.  Iron Studies: No results for input(s): IRON, TIBC, TRANSFERRIN, FERRITIN in the last 72 hours. Studies/Results: CT ABDOMEN PELVIS W CONTRAST  Result Date: 01/07/2020 CLINICAL DATA:  Constipation, abdominal pain EXAM: CT ABDOMEN AND PELVIS WITH CONTRAST TECHNIQUE: Multidetector CT imaging of the abdomen and pelvis was performed using the standard protocol following bolus administration of intravenous contrast. CONTRAST:  187mL OMNIPAQUE IOHEXOL 300 MG/ML  SOLN COMPARISON:  CT 11/11/2019, MRCP 07/09/2019 FINDINGS: Lower chest: Lung bases are clear. Normal heart size. No pericardial effusion. Atherosclerotic calcifications of the coronaries. Hepatobiliary: Some mild heterogeneity of the liver enhancement likely related to contrast timing. No focal concerning lesions. Smooth surface contour. Normal gallbladder and biliary tree without visible calcified gallstone. Pancreas: Mild prominence of the pancreatic duct towards the level of the body with tapering at the level of the head is similar to comparison MRCP without discernible lesion on either examination. No peripancreatic inflammation. Spleen: Normal splenic size. A subcentimeter hypoattenuating focus in the anterior spleen too small to fully characterize on CT  imaging but statistically likely benign. Small accessory splenule. Adrenals/Urinary Tract: Lobular thickening of the adrenal glands, likely senescent hyperplasia. No concerning nodules. Kidneys enhance symmetrically with some mildly delayed excretion. Numerous subcentimeter hypoattenuating foci too small to fully characterize on CT imaging but statistically likely benign. No worrisome renal lesions. No urolithiasis or hydronephrosis. Urinary bladder is largely decompressed at the time of exam and therefore poorly evaluated by CT imaging. Mild wall thickening, nonspecific, and perivesicular haze Stomach/Bowel: Distal esophagus, stomach and duodenal sweep are unremarkable. No small bowel wall thickening or dilatation. No evidence of obstruction. A normal appendix is visualized. Moderate colonic stool burden. Mild proximal colonic mural thickening with mucosal hyperemia. Vascular/Lymphatic: Atherosclerotic calcifications within the abdominal aorta and branch vessels. No aneurysm or ectasia. No enlarged abdominopelvic lymph nodes. Reproductive: Uterus is surgically absent. No concerning adnexal lesions. Other: No abdominopelvic free fluid or free gas. No bowel containing hernias. Musculoskeletal: No acute osseous abnormality or suspicious osseous lesion. Multilevel degenerative changes are present in the imaged portions of the spine. Transitional lumbosacral anatomy. Degenerative changes at the pubic symphysis, may reflect sequela of prior osteitis pubis, similar to prior. IMPRESSION: 1. Mild proximal colonic mural thickening with mucosal hyperemia may represent a mild colitis. Moderate colonic stool burden without evidence of obstruction. 2. Urinary bladder wall thickening versus underdistention. Correlate with urinary symptoms and consider urinalysis to exclude cystitis if present. 3. Stable mild prominence of the pancreatic duct, similar to comparison MRCP. 4. Aortic Atherosclerosis (ICD10-I70.0). Electronically  Signed   By: Lovena Le M.D.   On: 01/07/2020 15:06   DG Chest Port 1 View  Result Date: 01/07/2020 CLINICAL DATA:  Shortness of breath, constipated for 4 days EXAM: PORTABLE CHEST 1 VIEW COMPARISON:  07/18/2019, 12/15/2018 FINDINGS: No consolidation, features of edema, pneumothorax, or effusion. Pulmonary vascularity is normally distributed. The cardiomediastinal contours are unremarkable. No acute osseous or soft tissue abnormality. Left axillary surgical clips and vascular stent similar to prior. Degenerative changes are present in the imaged spine and shoulders. IMPRESSION: No acute cardiopulmonary abnormality. Electronically Signed   By: Lovena Le M.D.   On: 01/07/2020 14:59   Medications: Infusions:   Scheduled Medications: . amLODipine  10 mg Oral Q1200  . bisacodyl  10 mg Rectal Once  . Chlorhexidine Gluconate Cloth  6 each Topical Q0600  . cinacalcet  30 mg Oral QHS  . cloNIDine  0.1 mg Oral BID  . ferric citrate  420 mg Oral TID WC  . heparin  5,000 Units Subcutaneous Q8H  . [START ON 01/10/2020] lidocaine-prilocaine  1 application Topical Q T,Th,Sa-HD  . multivitamin  1 tablet Oral Daily    have reviewed scheduled and prn medications.  Physical Exam: General: NAD- on phone trying to order chicken broth Heart: RRR Lungs: mostly clear Abdomen: she says less distended Extremities: min edema Dialysis Access: left AVF-  patent  01/08/2020,8:19 AM  LOS: 0 days

## 2020-01-08 NOTE — Plan of Care (Signed)
  Problem: Coping: Goal: Level of anxiety will decrease Outcome: Progressing   Problem: Pain Managment: Goal: General experience of comfort will improve Outcome: Progressing   Problem: Safety: Goal: Ability to remain free from injury will improve Outcome: Progressing   Problem: Skin Integrity: Goal: Risk for impaired skin integrity will decrease Outcome: Progressing   

## 2020-01-08 NOTE — Progress Notes (Signed)
PROGRESS NOTE    Ashley [name]  FEO:[id number] DOB: Dec 10, 1950 DOA: 01/07/2020 PCP: Sonia Side., FNP  Brief Narrative:  69 year old black female ESRD TTS?  On transplant list at [facility] Recurrent pancreatitis status post extensive work-up 11/27/2019 Plan was for possible EUS in the outpatient setting by gastroenterology---no showed for appointment 12/07/2019 Faythe Ghee Also erosive gastropathy duodenitis 08/27/2019 intolerant to PPI Recent closed fracture 09/30/2019 radius followed by Dr. Stann Mainland Reflux HTN HLD  Came to [facility] hospital abdominal pain nausea vomiting cramping Took laxatives Tuesday no good response On exam lipase 67 CT showed pancreatic duct prominence with tapering similar to prior MRCP  Assessment & Plan:   Active Problems:   Pancreatitis   1. Pancreatitis a. Recalcitrant and intermittent symptoms-if no better will speak with GI in the a.m. b. Graduate diet from liquids to soft-if unable to tolerate probably will need EUS this admission given multiple hospitalizations and difficulty getting to outpatient follow-up c. Continue pain meds as she is needing them for comfort 2. ESRD TTS/secondary hyperparathyroidism/normocytic anemia a. Defer to renal-no threshold at this time for iron studies-continue ferric citrate 3 times daily-note that iron is poorly absorbed and more than twice daily dosing probably would not be tolerable to most patients b. Check phosphorus a.m.-Sensipar to be resumed in a.m. c. Dialysis as per them given mild volume overload as evidenced by labs 3. Chronic constipation a. Give scheduled senna if no better have placed order for sorbitol scheduled b. Reevaluate in a.m. 4. Reflux a. Place on Protonix if there is a need 5. HTN a. Continue amlodipine 10, clonidine 0.1 twice daily b. Adjust meds as needed c. Metoprolol XL held for unclear reason-seems like patient was not taking it? 6. HLD 7. Recent closed radial fracture 09/2019  DVT  prophylaxis: Lovenox Code Status: Full Family Communication: None present Disposition:   Status is: Observation  The patient will require care spanning > 2 midnights and should be moved to inpatient because: Hemodynamically unstable, Persistent severe electrolyte disturbances, Unsafe d/c plan and IV treatments appropriate due to intensity of illness or inability to take PO  Dispo: The patient is from: Home              Anticipated d/c is to: Home              Anticipated d/c date is: 2 days              Patient currently is medically stable to d/c.       Consultants:   Renal  Procedures: Multiple  Antimicrobials: None   Subjective: Seems frustrated "I have been in the hospital forever and keep coming back"  Objective: Vitals:   01/07/20 1826 01/07/20 1926 01/07/20 2008 01/08/20 0342  BP: (!) 139/58 (!) 136/52 (!) 177/75 (!) 158/61  Pulse: 59 69 63 60  Resp: 18 18 16 16   Temp: 98.2 F (36.8 C) 98.3 F (36.8 C) 98.4 F (36.9 C) (!) 97.4 F (36.3 C)  TempSrc: Oral Oral Oral Oral  SpO2: 95% 95% 95% 92%  Weight:   59.6 kg   Height:   5\' 2"  (1.575 m)     Intake/Output Summary (Last 24 hours) at 01/08/2020 3254 Last data filed at 01/08/2020 0600 Gross per 24 hour  Intake 290 ml  Output 1 ml  Net 289 ml   Filed Weights   01/07/20 0457 01/07/20 2008  Weight: 59.6 kg 59.6 kg    Examination:  General exam: Disgruntled African-American female  no distress Respiratory system: Clear no added sound Cardiovascular system: S1-S2 no murmur Gastrointestinal system: Slight tenderness epigastrium slight distention no organomegaly. Central nervous system: ROM intact power 5/5 deferred reflexes etc. Extremities: Trace lower extremity edema Skin: As above Psychiatry: Congruent disgruntled  Data Reviewed: I have personally reviewed following labs and imaging studies   Radiology Studies: CT ABDOMEN PELVIS W CONTRAST  Result Date: 01/07/2020 CLINICAL DATA:  Constipation,  abdominal pain EXAM: CT ABDOMEN AND PELVIS WITH CONTRAST TECHNIQUE: Multidetector CT imaging of the abdomen and pelvis was performed using the standard protocol following bolus administration of intravenous contrast. CONTRAST:  157mL OMNIPAQUE IOHEXOL 300 MG/ML  SOLN COMPARISON:  CT 11/11/2019, MRCP 07/09/2019 FINDINGS: Lower chest: Lung bases are clear. Normal heart size. No pericardial effusion. Atherosclerotic calcifications of the coronaries. Hepatobiliary: Some mild heterogeneity of the liver enhancement likely related to contrast timing. No focal concerning lesions. Smooth surface contour. Normal gallbladder and biliary tree without visible calcified gallstone. Pancreas: Mild prominence of the pancreatic duct towards the level of the body with tapering at the level of the head is similar to comparison MRCP without discernible lesion on either examination. No peripancreatic inflammation. Spleen: Normal splenic size. A subcentimeter hypoattenuating focus in the anterior spleen too small to fully characterize on CT imaging but statistically likely benign. Small accessory splenule. Adrenals/Urinary Tract: Lobular thickening of the adrenal glands, likely senescent hyperplasia. No concerning nodules. Kidneys enhance symmetrically with some mildly delayed excretion. Numerous subcentimeter hypoattenuating foci too small to fully characterize on CT imaging but statistically likely benign. No worrisome renal lesions. No urolithiasis or hydronephrosis. Urinary bladder is largely decompressed at the time of exam and therefore poorly evaluated by CT imaging. Mild wall thickening, nonspecific, and perivesicular haze Stomach/Bowel: Distal esophagus, stomach and duodenal sweep are unremarkable. No small bowel wall thickening or dilatation. No evidence of obstruction. A normal appendix is visualized. Moderate colonic stool burden. Mild proximal colonic mural thickening with mucosal hyperemia. Vascular/Lymphatic:  Atherosclerotic calcifications within the abdominal aorta and branch vessels. No aneurysm or ectasia. No enlarged abdominopelvic lymph nodes. Reproductive: Uterus is surgically absent. No concerning adnexal lesions. Other: No abdominopelvic free fluid or free gas. No bowel containing hernias. Musculoskeletal: No acute osseous abnormality or suspicious osseous lesion. Multilevel degenerative changes are present in the imaged portions of the spine. Transitional lumbosacral anatomy. Degenerative changes at the pubic symphysis, may reflect sequela of prior osteitis pubis, similar to prior. IMPRESSION: 1. Mild proximal colonic mural thickening with mucosal hyperemia may represent a mild colitis. Moderate colonic stool burden without evidence of obstruction. 2. Urinary bladder wall thickening versus underdistention. Correlate with urinary symptoms and consider urinalysis to exclude cystitis if present. 3. Stable mild prominence of the pancreatic duct, similar to comparison MRCP. 4. Aortic Atherosclerosis (ICD10-I70.0). Electronically Signed   By: Lovena Le M.D.   On: 01/07/2020 15:06   DG Chest Port 1 View  Result Date: 01/07/2020 CLINICAL DATA:  Shortness of breath, constipated for 4 days EXAM: PORTABLE CHEST 1 VIEW COMPARISON:  07/18/2019, 12/15/2018 FINDINGS: No consolidation, features of edema, pneumothorax, or effusion. Pulmonary vascularity is normally distributed. The cardiomediastinal contours are unremarkable. No acute osseous or soft tissue abnormality. Left axillary surgical clips and vascular stent similar to prior. Degenerative changes are present in the imaged spine and shoulders. IMPRESSION: No acute cardiopulmonary abnormality. Electronically Signed   By: Lovena Le M.D.   On: 01/07/2020 14:59     Scheduled Meds: . amLODipine  10 mg Oral Q1200  . bisacodyl  10  mg Rectal Once  . Chlorhexidine Gluconate Cloth  6 each Topical Q0600  . cinacalcet  30 mg Oral QHS  . cloNIDine  0.1 mg Oral BID    . ferric citrate  420 mg Oral TID WC  . heparin  5,000 Units Subcutaneous Q8H  . [START ON 01/10/2020] lidocaine-prilocaine  1 application Topical Q T,Th,Sa-HD  . multivitamin  1 tablet Oral Daily   Continuous Infusions:   LOS: 0 days    Time spent: 60  Nita Sells, MD [facility] To contact the attending provider between 7A-7P or the covering provider during after hours 7P-7A, please log into the web site [email or web address] and access using universal [place] password for that web site. If you do not have the password, please call the hospital operator.  01/08/2020, 8:22 AM

## 2020-01-09 ENCOUNTER — Telehealth: Payer: Self-pay

## 2020-01-09 DIAGNOSIS — K859 Acute pancreatitis without necrosis or infection, unspecified: Principal | ICD-10-CM

## 2020-01-09 LAB — COMPREHENSIVE METABOLIC PANEL
ALT: 10 U/L (ref 0–44)
AST: 14 U/L — ABNORMAL LOW (ref 15–41)
Albumin: 3.1 g/dL — ABNORMAL LOW (ref 3.5–5.0)
Alkaline Phosphatase: 103 U/L (ref 38–126)
Anion gap: 10 (ref 5–15)
BUN: 24 mg/dL — ABNORMAL HIGH (ref 8–23)
CO2: 28 mmol/L (ref 22–32)
Calcium: 9.1 mg/dL (ref 8.9–10.3)
Chloride: 94 mmol/L — ABNORMAL LOW (ref 98–111)
Creatinine, Ser: 9.47 mg/dL — ABNORMAL HIGH (ref 0.44–1.00)
GFR calc Af Amer: 4 mL/min — ABNORMAL LOW (ref >60)
GFR calc non Af Amer: 4 mL/min — ABNORMAL LOW (ref >60)
Glucose, Bld: 110 mg/dL — ABNORMAL HIGH (ref 70–99)
Potassium: 5.1 mmol/L (ref 3.5–5.1)
Sodium: 132 mmol/L — ABNORMAL LOW (ref 135–145)
Total Bilirubin: 0.8 mg/dL (ref 0.3–1.2)
Total Protein: 7.1 g/dL (ref 6.5–8.1)

## 2020-01-09 LAB — CBC WITH DIFFERENTIAL/PLATELET
Abs Immature Granulocytes: 0.03 10*3/uL (ref 0.00–0.07)
Basophils Absolute: 0 10*3/uL (ref 0.0–0.1)
Basophils Relative: 1 %
Eosinophils Absolute: 0.3 10*3/uL (ref 0.0–0.5)
Eosinophils Relative: 4 %
HCT: 35.7 % — ABNORMAL LOW (ref 36.0–46.0)
Hemoglobin: 11.7 g/dL — ABNORMAL LOW (ref 12.0–15.0)
Immature Granulocytes: 0 %
Lymphocytes Relative: 31 %
Lymphs Abs: 2.3 10*3/uL (ref 0.7–4.0)
MCH: 31 pg (ref 26.0–34.0)
MCHC: 32.8 g/dL (ref 30.0–36.0)
MCV: 94.4 fL (ref 80.0–100.0)
Monocytes Absolute: 1 10*3/uL (ref 0.1–1.0)
Monocytes Relative: 13 %
Neutro Abs: 3.7 10*3/uL (ref 1.7–7.7)
Neutrophils Relative %: 51 %
Platelets: 293 10*3/uL (ref 150–400)
RBC: 3.78 MIL/uL — ABNORMAL LOW (ref 3.87–5.11)
RDW: 13.7 % (ref 11.5–15.5)
WBC: 7.2 10*3/uL (ref 4.0–10.5)
nRBC: 0 % (ref 0.0–0.2)

## 2020-01-09 LAB — LIPASE, BLOOD: Lipase: 52 U/L — ABNORMAL HIGH (ref 11–51)

## 2020-01-09 LAB — PHOSPHORUS: Phosphorus: 4.7 mg/dL — ABNORMAL HIGH (ref 2.5–4.6)

## 2020-01-09 MED ORDER — BOOST / RESOURCE BREEZE PO LIQD CUSTOM
1.0000 | Freq: Three times a day (TID) | ORAL | Status: DC
  Administered 2020-01-09 – 2020-01-11 (×4): 1 via ORAL

## 2020-01-09 MED ORDER — SORBITOL 70 % SOLN
15.0000 mL | Freq: Every day | Status: DC
  Administered 2020-01-10: 15 mL via ORAL
  Filled 2020-01-09: qty 30

## 2020-01-09 MED ORDER — PANTOPRAZOLE SODIUM 40 MG PO TBEC
40.0000 mg | DELAYED_RELEASE_TABLET | Freq: Two times a day (BID) | ORAL | Status: DC
  Administered 2020-01-09 – 2020-01-11 (×5): 40 mg via ORAL
  Filled 2020-01-09 (×5): qty 1

## 2020-01-09 MED ORDER — CHLORHEXIDINE GLUCONATE CLOTH 2 % EX PADS: 6.0000 | MEDICATED_PAD | Freq: Every day | CUTANEOUS | Status: DC

## 2020-01-09 NOTE — Progress Notes (Signed)
Initial Nutrition Assessment  DOCUMENTATION CODES:   Not applicable  INTERVENTION:   -Continue renal MVI daily -Boost Breeze po TID, each supplement provides 250 kcal and 9 grams of protein  NUTRITION DIAGNOSIS:   Inadequate oral intake related to altered GI function as evidenced by meal completion < 50%.  GOAL:   Patient will meet greater than or equal to 90% of their needs  MONITOR:   PO intake, Supplement acceptance, Labs, Weight trends, Skin, I & O's  REASON FOR ASSESSMENT:   Malnutrition Screening Tool    ASSESSMENT:   Ashley [name] is a 69 y.o. female with medical history significant of recurrent pancreatitis (with a rather normal MRCP January 2021, and IgG4 negative in June 2021, and normal triglyceride 2021), ESRD on HD TTS, GERD, HTN, HLD, presented with on and off abdominal pain.  Symptoms started 5 days ago, on and off, cramping like, happens every time patient tries to eat something, last for 30-60 min, nonradiating, denied any fever chills no diarrhea  Pt admitted with intractable abdominal pain, likely pancreatitis.   Reviewed I/O's: -1.5 L x 24 hours and -1.2 L since admission  Per nephrology notes, pt EDW is 59.5 kg. Reviewed wt hx, pt has experienced a 3.3% wt loss over the past 6 months, which is not significant for time frame.   Pt sleeping soundly at time of visit and did not respond to name being called.   Pt with poor appetite due to pain; pt unable to consume spaghetti, as it upset her stomach and consumed about 50% of her lunch per MD notes.   Pt would greatly benefit from oral nutrition supplements.   Labs reviewed: Phos: 4.7.   Diet Order:   Diet Order            DIET SOFT Room service appropriate? Yes; Fluid consistency: Thin  Diet effective now                 EDUCATION NEEDS:   No education needs have been identified at this time  Skin:  Skin Assessment: Reviewed RN Assessment  Last BM:  01/03/20  Height:   Ht Readings from  Last 1 Encounters:  01/07/20 5\' 2"  (1.575 m)    Weight:   Wt Readings from Last 1 Encounters:  01/08/20 59.5 kg    Ideal Body Weight:  50 kg  BMI:  Body mass index is 23.99 kg/m.  Estimated Nutritional Needs:   Kcal:  1610-9604  Protein:  100-115 grams  Fluid:  1000 ml + UOP    Loistine Chance, RD, LDN, [place] Registered Dietitian II Certified Diabetes Care and Education Specialist Please refer to [facility] for RD and/or RD on-call/weekend/after hours pager

## 2020-01-09 NOTE — Consult Note (Addendum)
Referring Provider: Dr. Verlon Au  Primary Care Physician:  Sonia Side., FNP Primary Gastroenterologist:  Dr. Hilarie Fredrickson  Reason for Consultation: Recurrent idiopathic pancreatitis   HPI: Ashley [name] is a 69 y.o. female with a past medical history of hypertension, cocaine and IV drug abuse (abstinent for the past year), recurrent acute pancreatitis with most recent episodes 07/09/2019, 07/14/2019, 09/05/2019 and 11/11/2019 with normal IgG4 and Tgs levels, GERD, perforated ulcer with surgical repair 1990,  hepatitis C genotype 2B treated with Epclusa 04/2019,  end-stage renal disease on hemodialysis T/T/S and right radial fracture 09/2019.   She presented to [facility] emergency room 01/07/2020 with complaints of generalized abdominal pain x 1 week with nausea and vomiting x 1 episode. No BM for 1 week. Labs in the ED showed Lipase 69. Normal LFTs. WBC 9.0. An abdominal/pelvic CT scan 01/07/2020 showed mild prominence of the pancreatic duct towards the level of the body with tapering at the level of the head is similar to comparison MRCP,  proximal colonic thickening with mucosal hyperemia suggestive of a part of possible colitis.  A moderate amount of stool burden was noted without evidence of an obstruction.  Currently, she is having epigastric and LUQ pain which she rates is a 7 out of 10. Her nausea is controlled. No vomiting since admission. No BM since admission. She denies alcohol use. No IV drug use or cocaine for the past year. She smokes marijuana twice daily which she stated reduces her nausea. She denies eating fatty foods. She eats small quantities of food which results in upper abdominal pain. She has chronic constipation. Stools are darker brown to black which she attributes to taking iron po. No rectal bleeding. She's never had a colonoscopy. She reported submitting a Cologuard test x 2 in the past year but she never received these results. She has a history of hepatitis C for which  she stated was treated with a course of Epclusa as prescribed by ID Nurse Practitioner Mauricio Po 04/2019 - 07/2019. She stated completing the 3 month course of Epclusa. I do not see a repeat Hep C RNA  Quant 3 months post Epclusa treatment to verify SVR.    Labs 01/07/2020: Sodium 127.  Potassium 5.1.  Glucose 128.  BUN 52.  Creatinine 13.26.  Alk phos 110.  Albumin 3.6.  Lipase 69.  AST 16.  ALT 11.  Total bili 0.5.  WBC 9.0.  Hemoglobin 12.7.  Hematocrit 38.6.  MCV 96.  Platelet 288.  Abdominal/pelvic CT scan 01/07/2020: 1. Mild proximal colonic mural thickening with mucosal hyperemia may represent a mild colitis. Moderate colonic stool burden without evidence of obstruction. 2. Urinary bladder wall thickening versus underdistention. Correlate with urinary symptoms and consider urinalysis to exclude cystitis if present. 3. Stable mild prominence of the pancreatic duct, similar to comparison MRCP. 4. Aortic Atherosclerosis   Abdominal MRI with MRCP 07/09/2019:` Mild acute pancreatitis. No evidence of pancreatic necrosis, pseudocyst, or other complication. No evidence of biliary ductal dilatation or choledocholithiasis. Hemosiderosis.  EGD 09/06/2019 by Dr. Carlean Purl during hospital admission with pancreatitis.  - Erosive gastropathy with no stigmata of recent bleeding. Biopsied. - Duodenitis. Biopsied. - A single non-bleeding angiodysplastic lesion in the duodenum. Treated with argon plasma coagulation (APC). Thought reasonable to ablate as at higher risk of bleeding given comorbidities - The examination was otherwise normal. -An EUS was recommended as an outpatient but she no showed for her follow up appointment.    Past Medical History:  Diagnosis Date  . Acute pancreatitis 2000   2000, 12/2018, 08/2019  . Arthritis   . Cervical radiculopathy 02/28/2011  . Cocaine substance abuse ([place]) 05/26/2013   positive UDS   . Duodenitis   . Erosive gastropathy   . ESRD on hemodialysis  (HCC)    TTS  . GERD (gastroesophageal reflux disease)   . 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  genotype 2B.  Epclusa started early 04/2020.    Marland Kitchen Hiatal hernia   . Hyperlipidemia 2015  . Hypertension 2008  . Marijuana abuse 05/27/2003   positive UDS, family members smoke as well  . Pancreatitis   . 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 Schatzki's ring   . Stroke ([place]) 06/2017   MRI:MRI: small, subacute left internal capsule infarct.  Chronic microvascular ischemic changes w parenchymal volume loss. Chronic white matter periventricular microhemorrhage, likely due to htn  . Ulcer 1990   gastric ulcer. Ruptured s/p emergency repair    Past Surgical History:  Procedure Laterality Date  . ABDOMINAL HYSTERECTOMY  1979  . AV FISTULA PLACEMENT Left 06/16/2017   Procedure: ARTERIOVENOUS (AV) FISTULA CREATION LEFT ARM;  Surgeon: Conrad [place], MD;  Location: [place];  Service: Vascular;  Laterality: Left;  . BASCILIC VEIN TRANSPOSITION Left 10/02/2017   Procedure: BASILIC VEIN TRANSPOSITION SECOND STAGE LEFT ARM;  Surgeon: Rosetta Posner, MD;  Location: [place];  Service: Vascular;  Laterality: Left;  . BIOPSY  09/06/2019   Procedure: BIOPSY;  Surgeon: Gatha Mayer, MD;  Location: [facility] ENDOSCOPY;  Service: Endoscopy;;  . ESOPHAGOGASTRODUODENOSCOPY N/A 05/29/2013   Procedure: ESOPHAGOGASTRODUODENOSCOPY (EGD);  Surgeon: Jerene Bears, MD;  Location: [facility] ENDOSCOPY;  Service: Endoscopy;  Laterality: N/A;  . ESOPHAGOGASTRODUODENOSCOPY  05/2013   for epigastric pain.  Nonobstructing Schatzki ring at GEJ, mild gastropathy, nonbleeding AVMs in bulb and D2. 5 mm sessile polyp in bulb.  . ESOPHAGOGASTRODUODENOSCOPY (EGD) WITH PROPOFOL N/A 09/06/2019   Procedure: ESOPHAGOGASTRODUODENOSCOPY (EGD) WITH PROPOFOL;  Surgeon: Gatha Mayer, MD;  Location: [place];  Service: Endoscopy;  Laterality: N/A;  . EXCHANGE OF A DIALYSIS CATHETER Left 07/31/2017   Procedure: Removal  OF A  Right GroinTUNNELED  DIALYSIS  CATHETER ,  Insertion of Left Femoral Dialysis Catheter.;  Surgeon: Rosetta Posner, MD;  Location: [place];  Service: Vascular;  Laterality: Left;  . HOT HEMOSTASIS N/A 09/06/2019   Procedure: HOT HEMOSTASIS (ARGON PLASMA COAGULATION/BICAP);  Surgeon: Gatha Mayer, MD;  Location: [facility] ENDOSCOPY;  Service: Endoscopy;  Laterality: N/A;  . INSERTION OF DIALYSIS CATHETER Right 06/16/2017   Procedure: INSERTION OF DIALYSIS CATHETER;  Surgeon: Conrad , MD;  Location: [place];  Service: Vascular;  Laterality: Right;  . IR AV DIALY SHUNT INTRO [place] W/PTA/IMG LEFT  06/21/2018  . REPAIR OF PERFORATED ULCER  1990   gastric ulcer    Prior to Admission medications   Medication Sig Start Date End Date Taking? Authorizing Provider  amLODipine (NORVASC) 10 MG tablet Take 1 tablet (10 mg total) by mouth daily. Patient taking differently: Take 10 mg by mouth daily at 12 noon.  11/11/16  Yes Phelps, Aviva Signs Y, DO  B Complex-C-Zn-Folic Acid (DIALYVITE 914 WITH ZINC) 0.8 MG TABS Take 1 tablet by mouth daily.  07/07/19  Yes [provider]  cinacalcet (SENSIPAR) 30 MG tablet Take 30 mg by mouth at bedtime.    Yes [provider]  cloNIDine (CATAPRES) 0.1 MG tablet Take 0.1 mg by mouth 2 (two)  times daily. 06/25/19  Yes [provider]  ferric citrate (AURYXIA) 1 GM 210 MG(Fe) tablet Take 420 mg by mouth 3 (three) times daily with meals.    Yes [provider]  lidocaine-prilocaine (EMLA) cream Apply 1 application topically See admin instructions. Apply small amount to access site on Tuesday, Thursday, Saturday one hour before dialysis. Cover with occlusive dressing (saran wrap) 08/25/19  Yes [provider]  Nutritional Supplements (FEEDING SUPPLEMENT, NEPRO CARB STEADY,) LIQD Take 237 mLs by mouth 3 (three) times daily as needed (Supplement). Patient taking differently: Take 237 mLs by mouth 2 (two) times daily as needed (meal supplement).  07/11/19  Yes Black,  Lezlie Octave, NP  pregabalin (LYRICA) 75 MG capsule Take 75 mg by mouth 2 (two) times daily as needed (nerve pain).  11/28/19  Yes [provider]  sorbitol 70 % SOLN Take 30 mLs by mouth daily as needed (constipation).  11/29/19  Yes [provider]  metoprolol succinate (TOPROL-XL) 25 MG 24 hr tablet Take 1 tablet (25 mg total) by mouth daily. Patient not taking: Reported on 11/12/2019 09/11/19   Caren Griffins, MD  polyethylene glycol (MIRALAX) 17 g packet Take 17 g by mouth 2 (two) times daily. Patient not taking: Reported on 01/07/2020 09/14/19   Delia Heady, PA-C    Current Facility-Administered Medications  Medication Dose Route Frequency Provider Last Rate Last Admin  . acetaminophen (TYLENOL) tablet 650 mg  650 mg Oral Q6H PRN Wynetta Fines T, MD       Or  . acetaminophen (TYLENOL) suppository 650 mg  650 mg Rectal Q6H PRN Wynetta Fines T, MD      . amLODipine (NORVASC) tablet 10 mg  10 mg Oral Q1200 Wynetta Fines T, MD   10 mg at 01/09/20 1114  . bisacodyl (DULCOLAX) suppository 10 mg  10 mg Rectal Once Wynetta Fines T, MD      . Chlorhexidine Gluconate Cloth 2 % PADS 6 each  6 each Topical Q0600 Gean Quint, MD   6 each at 01/08/20 0600  . Chlorhexidine Gluconate Cloth 2 % PADS 6 each  6 each Topical Q0600 Penninger, [place], [place]      . cloNIDine (CATAPRES) tablet 0.1 mg  0.1 mg Oral BID Wynetta Fines T, MD   0.1 mg at 01/09/20 0948  . [START ON 01/10/2020] doxercalciferol (HECTOROL) injection 2 mcg  2 mcg Intravenous Q T,Th,Sa-HD Corliss Parish, MD      . feeding supplement (NEPRO CARB STEADY) liquid 237 mL  237 mL Oral BID PRN Wynetta Fines T, MD      . heparin injection 5,000 Units  5,000 Units Subcutaneous Q8H Lequita Halt, MD   5,000 Units at 01/09/20 [phone]  . HYDROmorphone (DILAUDID) injection 0.5-1 mg  0.5-1 mg Intravenous Q2H PRN Wynetta Fines T, MD   1 mg at 01/08/20 1938  . [START ON 01/10/2020] lidocaine-prilocaine (EMLA) cream 1 application  1 application Topical Q  T,Th,Sa-HD Wynetta Fines T, MD      . multivitamin (RENA-VIT) tablet 1 tablet  1 tablet Oral Daily Lequita Halt, MD   1 tablet at 01/09/20 [phone]  . ondansetron (ZOFRAN) tablet 4 mg  4 mg Oral Q6H PRN Wynetta Fines T, MD       Or  . ondansetron [facility]) injection 4 mg  4 mg Intravenous Q6H PRN Lequita Halt, MD   4 mg at 01/07/20 1632  . oxyCODONE (Oxy IR/ROXICODONE) immediate release tablet 5 mg  5 mg  Oral Q4H PRN Lequita Halt, MD   5 mg at 01/09/20 [phone]  . pantoprazole (PROTONIX) EC tablet 40 mg  40 mg Oral BID Nita Sells, MD      . polyethylene glycol (MIRALAX / GLYCOLAX) packet 17 g  17 g Oral BID PRN Wynetta Fines T, MD      . pregabalin (LYRICA) capsule 75 mg  75 mg Oral BID PRN Wynetta Fines T, MD      . senna-docusate (Senokot-S) tablet 1 tablet  1 tablet Oral QHS Nita Sells, MD   1 tablet at 01/08/20 2258  . sorbitol 70 % solution 15 mL  15 mL Oral Daily Nita Sells, MD        Allergies as of 01/07/2020 - Review Complete 01/07/2020  Allergen Reaction Noted  . Aspirin Nausea And Vomiting 07/18/2009  . Ibuprofen Nausea And Vomiting 12/24/2009    Family History  Problem Relation Age of Onset  . Hypertension Father   . Cancer Father   . Hyperlipidemia Father   . Seizures Sister   . Early death Daughter   . Kidney disease Daughter        end stage dialysis dependent     Social History   Socioeconomic History  . Marital status: Widowed    Spouse name: Not on file  . Number of children: 3  . Years of education: Not on file  . Highest education level: Not on file  Occupational History  . Occupation: retired  Tobacco Use  . Smoking status: Current Every Day Smoker    Packs/day: 0.25    Years: 40.00    Pack years: 10.00    Types: Cigarettes  . Smokeless tobacco: Never Used  Vaping Use  . Vaping Use: Never used  Substance and Sexual Activity  . Alcohol use: No    Alcohol/week: 0.0 standard drinks  . Drug use: Not Currently    Types: Heroin,  Marijuana, Cocaine    Comment: hasn't used cocaine in 1-2 years; she smokes marijuana daily, "whenever I can get it"  . Sexual activity: Never  Other Topics Concern  . Not on file  Social History Narrative  . Not on file   Social Determinants of Health   Financial Resource Strain:   . Difficulty of Paying Living Expenses:   Food Insecurity:   . Worried About Charity fundraiser in the Last Year:   . Arboriculturist in the Last Year:   Transportation Needs:   . Film/video editor (Medical):   Marland Kitchen Lack of Transportation (Non-Medical):   Physical Activity:   . Days of Exercise per Week:   . Minutes of Exercise per Session:   Stress:   . Feeling of Stress :   Social Connections:   . Frequency of Communication with Friends and Family:   . Frequency of Social Gatherings with Friends and Family:   . Attends Religious Services:   . Active Member of Clubs or Organizations:   . Attends Archivist Meetings:   Marland Kitchen Marital Status:   Intimate Partner Violence:   . Fear of Current or Ex-Partner:   . Emotionally Abused:   Marland Kitchen Physically Abused:   . Sexually Abused:     Review of Systems: Gen: Denies fever, sweats or chills. No weight loss.  CV: Denies chest pain, palpitations or edema. Resp: Denies cough, shortness of breath of hemoptysis.  GI: See HPI.  GU : Anuric, ESRD on HD. MS: Denies joint pain, muscles aches  or weakness. Derm: Denies rash, itchiness, skin lesions or unhealing ulcers. Psych: Denies depression, anxiety, memory loss or confusion.  Heme: Denies easy bruising, bleeding. Neuro:  Denies headaches, dizziness or paresthesias. Endo:  Denies any problems with thyroid or adrenal function.  Physical Exam: Vital signs in last 24 hours: Temp:  [97.7 F (36.5 C)-99.1 F (37.3 C)] 98.6 F (37 C) (08/02 0828) Pulse Rate:  [73-102] 73 (08/02 0828) Resp:  [15-18] 17 (08/02 0828) BP: (124-156)/(69-84) 151/77 (08/02 0828) SpO2:  [97 %-99 %] 99 % (08/02  0828) Weight:  [59.5 kg] 59.5 kg (08/01 1325) Last BM Date: 01/05/20 General:  Alert 69 year old female in NAD.  Head:  Normocephalic and atraumatic. Eyes:  No scleral icterus. Conjunctiva pink. Ears:  Normal auditory acuity. Nose:  No deformity, discharge or lesions. Mouth: Poor dentition, many missing teeth. No ulcers or lesions.  Neck:  Supple. No lymphadenopathy or thyromegaly.  Lungs:  Breath sounds clear throughout.  Heart:  RRR, no murmur.    Abdomen:   Soft, nondistended. Moderate epigastric and LUQ tenderness without rebound or guarding. + BS x 4 quads. No HSM. Lower mid abdominal scar intact.  Rectal: Deferred. Musculoskeletal:  Symmetrical without gross deformities.  Pulses:  Normal pulses noted. Extremities:  LUE fistula with + bruit and thrill.  Neurologic:  Alert and  oriented x4. No focal deficits.  Skin:  Intact without significant lesions or rashes. Psych:  Alert and cooperative. Normal mood and affect.  Intake/Output from previous day: 08/01 0701 - 08/02 0700 In: -  Out: 1500  Intake/Output this shift: No intake/output data recorded.  Lab Results: Recent Labs    01/07/20 0502 01/09/20 0813  WBC 9.0 7.2  HGB 12.7 11.7*  HCT 38.6 35.7*  PLT 288 293   BMET Recent Labs    01/07/20 0502 01/09/20 0813  NA 127* 132*  K 5.1 5.1  CL 88* 94*  CO2 22 28  GLUCOSE 128* 110*  BUN 52* 24*  CREATININE 13.26* 9.47*  CALCIUM 9.2 9.1   LFT Recent Labs    01/09/20 0813  PROT 7.1  ALBUMIN 3.1*  AST 14*  ALT 10  ALKPHOS 103  BILITOT 0.8   PT/INR No results for input(s): LABPROT, INR in the last 72 hours. Hepatitis Panel No results for input(s): HEPBSAG, HCVAB, HEPAIGM, HEPBIGM in the last 72 hours.    Studies/Results: CT ABDOMEN PELVIS W CONTRAST  Result Date: 01/07/2020 CLINICAL DATA:  Constipation, abdominal pain EXAM: CT ABDOMEN AND PELVIS WITH CONTRAST TECHNIQUE: Multidetector CT imaging of the abdomen and pelvis was performed using the  standard protocol following bolus administration of intravenous contrast. CONTRAST:  148m OMNIPAQUE IOHEXOL 300 MG/ML  SOLN COMPARISON:  CT 11/11/2019, MRCP 07/09/2019 FINDINGS: Lower chest: Lung bases are clear. Normal heart size. No pericardial effusion. Atherosclerotic calcifications of the coronaries. Hepatobiliary: Some mild heterogeneity of the liver enhancement likely related to contrast timing. No focal concerning lesions. Smooth surface contour. Normal gallbladder and biliary tree without visible calcified gallstone. Pancreas: Mild prominence of the pancreatic duct towards the level of the body with tapering at the level of the head is similar to comparison MRCP without discernible lesion on either examination. No peripancreatic inflammation. Spleen: Normal splenic size. A subcentimeter hypoattenuating focus in the anterior spleen too small to fully characterize on CT imaging but statistically likely benign. Small accessory splenule. Adrenals/Urinary Tract: Lobular thickening of the adrenal glands, likely senescent hyperplasia. No concerning nodules. Kidneys enhance symmetrically with some mildly delayed excretion. Numerous  subcentimeter hypoattenuating foci too small to fully characterize on CT imaging but statistically likely benign. No worrisome renal lesions. No urolithiasis or hydronephrosis. Urinary bladder is largely decompressed at the time of exam and therefore poorly evaluated by CT imaging. Mild wall thickening, nonspecific, and perivesicular haze Stomach/Bowel: Distal esophagus, stomach and duodenal sweep are unremarkable. No small bowel wall thickening or dilatation. No evidence of obstruction. A normal appendix is visualized. Moderate colonic stool burden. Mild proximal colonic mural thickening with mucosal hyperemia. Vascular/Lymphatic: Atherosclerotic calcifications within the abdominal aorta and branch vessels. No aneurysm or ectasia. No enlarged abdominopelvic lymph nodes. Reproductive:  Uterus is surgically absent. No concerning adnexal lesions. Other: No abdominopelvic free fluid or free gas. No bowel containing hernias. Musculoskeletal: No acute osseous abnormality or suspicious osseous lesion. Multilevel degenerative changes are present in the imaged portions of the spine. Transitional lumbosacral anatomy. Degenerative changes at the pubic symphysis, may reflect sequela of prior osteitis pubis, similar to prior. IMPRESSION: 1. Mild proximal colonic mural thickening with mucosal hyperemia may represent a mild colitis. Moderate colonic stool burden without evidence of obstruction. 2. Urinary bladder wall thickening versus underdistention. Correlate with urinary symptoms and consider urinalysis to exclude cystitis if present. 3. Stable mild prominence of the pancreatic duct, similar to comparison MRCP. 4. Aortic Atherosclerosis (ICD10-I70.0). Electronically Signed   By: Lovena Le M.D.   On: 01/07/2020 15:06   DG Chest Port 1 View  Result Date: 01/07/2020 CLINICAL DATA:  Shortness of breath, constipated for 4 days EXAM: PORTABLE CHEST 1 VIEW COMPARISON:  07/18/2019, 12/15/2018 FINDINGS: No consolidation, features of edema, pneumothorax, or effusion. Pulmonary vascularity is normally distributed. The cardiomediastinal contours are unremarkable. No acute osseous or soft tissue abnormality. Left axillary surgical clips and vascular stent similar to prior. Degenerative changes are present in the imaged spine and shoulders. IMPRESSION: No acute cardiopulmonary abnormality. Electronically Signed   By: Lovena Le M.D.   On: 01/07/2020 14:59    IMPRESSION/PLAN:  109.  69 year old female with recurrent acute pancreatitis, normal IgG5 and TGs levels. Upper abdominal pain. N/V.  Lipase 52. CTAP WO contrast 7/31 showed mild prominence of the pancreatic duct towards the level of the body with tapering at the level of the head is similar to her prior MRCP.  -EUS to further evaluate the pancreas,  timing to be verified by Dr. Ardis Hughs -Pain management per the hospitalist  -Ondansetron 30m po or IV Q 6 hrs PRN -Diet as tolerated   2. GERD. EGD 08/2019 showed gastropathy, duodenitis and a single nonbleeding duodenal AVM treated with APC.  -Continue Pantoprazole 436mpo bid  3. ESRD on hemodialysis. Last dialysis session was on 01/08/2020. [facility]. Chronic anemia.  Hemoglobin 11.7.  MCV 94.4.  5. History of chronic hepatitis C gentotype 2B. HCV RNA quant 6,110,000 on 04/06/2019.  Patient reported taking Epclusa x 3 months (04/2019 - 07/2019). 3 month post treatment HCV RNA quant to check for SVR was not done.  -Hep C RNA quant in am  6. Chronic constipation -Continue Duloclax suppository Q HS PRN. Miralax bid PRN. Senna 1 tab po QHS and Sorbitol 70% 1536mo QD as tolerated.   6. Colon cancer screening. Cologuard test submitted x  July and August 2020 (specimen could not be processed) -Follow up Cologuard testing as outpatient   ColNoralyn Pick/07/2019, 12:27 PM   ________________________________________________________________________  LeBVelora Heckler MD note:  I personally examined the patient, reviewed the data and agree with  the assessment and plan described above. She's been having episodes of pancreatitis since January of this year.  No FH of pancreatic disease. She drank etoh in the past, never abused it that she thinks. No gallstones.  She's had one MRI and 3 CT scans this calendar year so far.  I recommended evaluating her pancreas with EUS in 2-3 weeks from now as an outpatient if we are able to get her home in the next few days (usually spends 3-4 nights in hospital for these episodes). She understands waiting a few weeks to allow acute inflammation to subside will give best visualization of her pancreas. We are tentatively scheduling it for Thursday August 26th.  Will follow along as well.   Owens Loffler, MD [facility]  Gastroenterology Pager [phone]

## 2020-01-09 NOTE — Plan of Care (Signed)
  Problem: Education: Goal: Knowledge of Pancreatitis treatment and prevention will improve Outcome: Progressing   Problem: Nutritional: Goal: Ability to achieve adequate nutritional intake will improve Outcome: Progressing   Problem: Clinical Measurements: Goal: Complications related to the disease process, condition or treatment will be avoided or minimized Outcome: Progressing   Problem: Education: Goal: Knowledge of General Education information will improve Description: Including pain rating scale, medication(s)/side effects and non-pharmacologic comfort measures Outcome: Progressing   Problem: Health Behavior/Discharge Planning: Goal: Ability to manage health-related needs will improve Outcome: Progressing

## 2020-01-09 NOTE — Progress Notes (Addendum)
[facility] Progress Note   Subjective:  Patient was examined while she was lying in bed. States that her pain is worse then yesterday across her entire abdomen. She reports that she is not sure if it is due to constipation or her pancreas. She says that hasn't had a bowel movement since admission to the hospital but states that she took Senokot last night. Of note patient refused Dulcolax on 7/31 and has been receiving narcotics for her pain. Otherwise, she has no new complaints. She says dialysis went well yesterday. She denies N/V, diarrhea, SOB, and CP.   Objective Vitals:   01/08/20 1406 01/08/20 2006 01/09/20 0435 01/09/20 0828  BP: 124/79 (!) 145/69 (!) 156/75 (!) 151/77  Pulse: 99 87 77 73  Resp: 17 16 15 17   Temp: 97.7 F (36.5 C) 99.1 F (37.3 C) 98.4 F (36.9 C) 98.6 F (37 C)  TempSrc: Oral Oral Oral Oral  SpO2: 98% 97% 99% 99%  Weight:      Height:       Physical Exam General:WDWN, NAD, lying comfortably in bed Heart:RRR. No gallops, rubs, or murmurs. Lungs: Clear and equal bilaterally. No increased work of breathing. Abdomen:Tender to light palpation. ND, soft, +BS.  Extremities: No edema on lower extremities. Dialysis Access: left AVF - +Bruit  Filed Weights   01/07/20 2008 01/08/20 1025 01/08/20 1325  Weight: 59.6 kg 60.9 kg 59.5 kg    Intake/Output Summary (Last 24 hours) at 01/09/2020 0940 Last data filed at 01/08/2020 1325 Gross per 24 hour  Intake --  Output 1500 ml  Net -1500 ml    Additional Objective Labs: Basic Metabolic Panel: Recent Labs  Lab 01/07/20 0502 01/09/20 0412 01/09/20 0813  NA 127*  --  132*  K 5.1  --  5.1  CL 88*  --  94*  CO2 22  --  28  GLUCOSE 128*  --  110*  BUN 52*  --  24*  CREATININE 13.26*  --  9.47*  CALCIUM 9.2  --  9.1  PHOS  --  4.7*  --    Liver Function Tests: Recent Labs  Lab 01/07/20 0502 01/09/20 0813  AST 16 14*  ALT 11 10  ALKPHOS 110 103  BILITOT 0.5 0.8  PROT 7.6 7.1  ALBUMIN  3.6 3.1*   Recent Labs  Lab 01/07/20 0502 01/09/20 0813  LIPASE 69* 52*   CBC: Recent Labs  Lab 01/07/20 0502 01/09/20 0813  WBC 9.0 7.2  NEUTROABS  --  3.7  HGB 12.7 11.7*  HCT 38.6 35.7*  MCV 96.0 94.4  PLT 288 293   Studies/Results: CT ABDOMEN PELVIS W CONTRAST  Result Date: 01/07/2020 CLINICAL DATA:  Constipation, abdominal pain EXAM: CT ABDOMEN AND PELVIS WITH CONTRAST TECHNIQUE: Multidetector CT imaging of the abdomen and pelvis was performed using the standard protocol following bolus administration of intravenous contrast. CONTRAST:  138mL OMNIPAQUE IOHEXOL 300 MG/ML  SOLN COMPARISON:  CT 11/11/2019, MRCP 07/09/2019 FINDINGS: Lower chest: Lung bases are clear. Normal heart size. No pericardial effusion. Atherosclerotic calcifications of the coronaries. Hepatobiliary: Some mild heterogeneity of the liver enhancement likely related to contrast timing. No focal concerning lesions. Smooth surface contour. Normal gallbladder and biliary tree without visible calcified gallstone. Pancreas: Mild prominence of the pancreatic duct towards the level of the body with tapering at the level of the head is similar to comparison MRCP without discernible lesion on either examination. No peripancreatic inflammation. Spleen: Normal splenic size. A subcentimeter hypoattenuating focus in  the anterior spleen too small to fully characterize on CT imaging but statistically likely benign. Small accessory splenule. Adrenals/Urinary Tract: Lobular thickening of the adrenal glands, likely senescent hyperplasia. No concerning nodules. Kidneys enhance symmetrically with some mildly delayed excretion. Numerous subcentimeter hypoattenuating foci too small to fully characterize on CT imaging but statistically likely benign. No worrisome renal lesions. No urolithiasis or hydronephrosis. Urinary bladder is largely decompressed at the time of exam and therefore poorly evaluated by CT imaging. Mild wall thickening,  nonspecific, and perivesicular haze Stomach/Bowel: Distal esophagus, stomach and duodenal sweep are unremarkable. No small bowel wall thickening or dilatation. No evidence of obstruction. A normal appendix is visualized. Moderate colonic stool burden. Mild proximal colonic mural thickening with mucosal hyperemia. Vascular/Lymphatic: Atherosclerotic calcifications within the abdominal aorta and branch vessels. No aneurysm or ectasia. No enlarged abdominopelvic lymph nodes. Reproductive: Uterus is surgically absent. No concerning adnexal lesions. Other: No abdominopelvic free fluid or free gas. No bowel containing hernias. Musculoskeletal: No acute osseous abnormality or suspicious osseous lesion. Multilevel degenerative changes are present in the imaged portions of the spine. Transitional lumbosacral anatomy. Degenerative changes at the pubic symphysis, may reflect sequela of prior osteitis pubis, similar to prior. IMPRESSION: 1. Mild proximal colonic mural thickening with mucosal hyperemia may represent a mild colitis. Moderate colonic stool burden without evidence of obstruction. 2. Urinary bladder wall thickening versus underdistention. Correlate with urinary symptoms and consider urinalysis to exclude cystitis if present. 3. Stable mild prominence of the pancreatic duct, similar to comparison MRCP. 4. Aortic Atherosclerosis (ICD10-I70.0). Electronically Signed   By: Lovena Le M.D.   On: 01/07/2020 15:06   DG Chest Port 1 View  Result Date: 01/07/2020 CLINICAL DATA:  Shortness of breath, constipated for 4 days EXAM: PORTABLE CHEST 1 VIEW COMPARISON:  07/18/2019, 12/15/2018 FINDINGS: No consolidation, features of edema, pneumothorax, or effusion. Pulmonary vascularity is normally distributed. The cardiomediastinal contours are unremarkable. No acute osseous or soft tissue abnormality. Left axillary surgical clips and vascular stent similar to prior. Degenerative changes are present in the imaged spine and  shoulders. IMPRESSION: No acute cardiopulmonary abnormality. Electronically Signed   By: Lovena Le M.D.   On: 01/07/2020 14:59    Medications:   amLODipine  10 mg Oral Q1200   bisacodyl  10 mg Rectal Once   Chlorhexidine Gluconate Cloth  6 each Topical Q0600   cloNIDine  0.1 mg Oral BID   [START ON 01/10/2020] doxercalciferol  2 mcg Intravenous Q T,Th,Sa-HD   heparin  5,000 Units Subcutaneous Q8H   [START ON 01/10/2020] lidocaine-prilocaine  1 application Topical Q T,Th,Sa-HD   multivitamin  1 tablet Oral Daily   senna-docusate  1 tablet Oral QHS   sorbitol  15 mL Oral Daily    Dialysis Orders: HD at [place]  TTS  3:30 - 160 dialyzer- BFR 350/DFR 800/EDW 59.5- 2K, 2.25 calc Profile #4 -  AVF left -  Heparin - yes- hectorol 2 mcg Last hgb 11.7- no ESA-  Calc 9.1, phos 4.7  Assessment/Plan: 1. Abdominal pain - CT showing proximal colonic mural thickening, possibly colitis, also moderate stool burden and urinary wall thickening and mild prominence of the pancreatic duct. Trying to advance diet. Continue pain medications as needed per primary. Managed by primary team. 2. ESRD - normally TTS schedule but received dialysis yesterday d/t missed treatments last week. Will plan to get back on schedule with HD tomorrow. Was hyperkalemic upon admission. K is now 5.1 - plan on 2K bath. Continue to monitor RFP.  3. Anemia of CKD- Hgb 11.7. Not on ESA. Will continue to monitor Hgb levels. 4. Secondary hyperparathyroidism - Corrected Ca is 9.82. Sensipar given on 7/31 but this has been discontinued until diet can be advanced.  Will plan on continuing Hectorol 37mcg during HD. Continue Auryxia when diet advanced. Monitor RFP. 5. HTN/volume - Hyponatremic upon admission but sodium has been trending up. BPs have been elevated - could be d/t pain. Currently on Amlodipine and Clonidine for blood pressures, which were held yesterday d/t dialysis.  UF to EDW yesterday with 1.5 L taken off and tolerated  well.  Will plan for UF 1-2L as tolerated tomorrow during HD.  6. Nutrition - Patient is currently on soft diet. Albumin 3.1. Continue feeding supplement, nepro carb steady.  Gustavus Messing, NP Student [employer]  Progress note written with assistance by NP student.  Provider performed own history and physical exam.  Plan discussed with student and documented.   Jen Mow, PA-C [facility] 01/09/2020,9:40 AM  LOS: 1 day   I have seen and examined this patient and agree with plan and assessment in the above note with renal recommendations/intervention highlighted.  She feels better and is eating lunch wihtout discomfort.  Continue with HD on TTS schedule.  Broadus John A Dillinger Aston,MD 01/09/2020 2:24 PM

## 2020-01-09 NOTE — Progress Notes (Signed)
PROGRESS NOTE    Ashley [name]  XBW:[id number] DOB: March 15, 1951 DOA: 01/07/2020 PCP: Sonia Side., FNP  Brief Narrative:  69 year old black female ESRD TTS?  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 Stann Mainland Reflux HTN HLD  Came to [facility] hospital abdominal pain nausea vomiting cramping Took laxatives Tuesday no good response On exam lipase 67 CT showed pancreatic duct prominence with tapering similar to prior MRCP  Assessment & Plan:   Active Problems:   Pancreatitis   Colitis   1. Pancreatitis a. Recalcitrant and intermittent symptoms-continue OxyIR 5 mg every 4 as needed with breakthrough Dilaudid 0.5 b. Given recurrent pain inability to tolerate dinner and breakfast this morning GI has been consulted to evaluate for EUS later this admission c. Continue pain meds as she is needing them for comfort 2. ESRD TTS/secondary 3. Mild hyperkalemia mild hyponatremia 4. Hyperparathyroidism/normocytic anemia a. Defer to renal 5. Chronic constipation a. Continue scheduled senna and added sorbitol scheduled in addition hopeful for effect 6. Reflux a. We will resume Protonix-not sure if she is having reflux-like symptoms although it does not seem that way 7. HTN a. Continue amlodipine 10, clonidine 0.1 twice daily b. Adjust meds as needed c. Metoprolol XL held for unclear reason-seems like patient was not taking it? 8. HLD 9. Recent closed radial fracture 09/2019  DVT prophylaxis: Lovenox Code Status: Full Family Communication: None present Disposition:   Status is: Observation  The patient will require care spanning > 2 midnights and should be moved to inpatient because:  Hemodynamically unstable, Persistent severe electrolyte disturbances, Unsafe d/c plan and IV treatments appropriate due to intensity of illness or inability to take PO  Dispo: The patient is from: Home              Anticipated d/c is to: Home              Anticipated d/c date is: 2 days              Patient currently is medically stable to d/c.       Consultants:   Renal  Procedures: Multiple  Antimicrobials: None   Subjective:  Did not manage to eat any of her dinner really yesterday states that the spaghetti "made her stomach burn" Breakfast is half eaten at the bedside Still complaining of pain No chest pain no fever  Objective: Vitals:   01/08/20 1406 01/08/20 2006 01/09/20 0435 01/09/20 0828  BP: 124/79 (!) 145/69 (!) 156/75 (!) 151/77  Pulse: 99 87 77 73  Resp: 17 16 15 17   Temp: 97.7 F (36.5 C) 99.1 F (37.3 C) 98.4 F (36.9 C) 98.6 F (37 C)  TempSrc: Oral Oral Oral Oral  SpO2: 98% 97% 99% 99%  Weight:      Height:        Intake/Output Summary (Last 24 hours) at 01/09/2020 1045 Last data filed at 01/08/2020 1325 Gross per 24 hour  Intake --  Output 1500 ml  Net -1500 ml   Filed Weights   01/07/20 2008 01/08/20 1025 01/08/20 1325  Weight: 59.6 kg 60.9 kg 59.5 kg    Examination:  Awake coherent in some painful distress Chest clear no added sound S1-S2 no murmur Abdomen slight distention generalized discomfort with no specific point  tenderness No lower extremity edema Neurologically grossly intact as is musculoskeletal Psych flat affect Skin has no bruises or rash  Data Reviewed: I have personally reviewed following labs and imaging studies Potassium 5.1 BUN/creatinine 24/9.4 White count 7 Platelet 293  Radiology Studies: CT ABDOMEN PELVIS W CONTRAST  Result Date: 01/07/2020 CLINICAL DATA:  Constipation, abdominal pain EXAM: CT ABDOMEN AND PELVIS WITH CONTRAST TECHNIQUE: Multidetector CT imaging of the abdomen and pelvis was performed using  the standard protocol following bolus administration of intravenous contrast. CONTRAST:  155mL OMNIPAQUE IOHEXOL 300 MG/ML  SOLN COMPARISON:  CT 11/11/2019, MRCP 07/09/2019 FINDINGS: Lower chest: Lung bases are clear. Normal heart size. No pericardial effusion. Atherosclerotic calcifications of the coronaries. Hepatobiliary: Some mild heterogeneity of the liver enhancement likely related to contrast timing. No focal concerning lesions. Smooth surface contour. Normal gallbladder and biliary tree without visible calcified gallstone. Pancreas: Mild prominence of the pancreatic duct towards the level of the body with tapering at the level of the head is similar to comparison MRCP without discernible lesion on either examination. No peripancreatic inflammation. Spleen: Normal splenic size. A subcentimeter hypoattenuating focus in the anterior spleen too small to fully characterize on CT imaging but statistically likely benign. Small accessory splenule. Adrenals/Urinary Tract: Lobular thickening of the adrenal glands, likely senescent hyperplasia. No concerning nodules. Kidneys enhance symmetrically with some mildly delayed excretion. Numerous subcentimeter hypoattenuating foci too small to fully characterize on CT imaging but statistically likely benign. No worrisome renal lesions. No urolithiasis or hydronephrosis. Urinary bladder is largely decompressed at the time of exam and therefore poorly evaluated by CT imaging. Mild wall thickening, nonspecific, and perivesicular haze Stomach/Bowel: Distal esophagus, stomach and duodenal sweep are unremarkable. No small bowel wall thickening or dilatation. No evidence of obstruction. A normal appendix is visualized. Moderate colonic stool burden. Mild proximal colonic mural thickening with mucosal hyperemia. Vascular/Lymphatic: Atherosclerotic calcifications within the abdominal aorta and branch vessels. No aneurysm or ectasia. No enlarged abdominopelvic lymph nodes.  Reproductive: Uterus is surgically absent. No concerning adnexal lesions. Other: No abdominopelvic free fluid or free gas. No bowel containing hernias. Musculoskeletal: No acute osseous abnormality or suspicious osseous lesion. Multilevel degenerative changes are present in the imaged portions of the spine. Transitional lumbosacral anatomy. Degenerative changes at the pubic symphysis, may reflect sequela of prior osteitis pubis, similar to prior. IMPRESSION: 1. Mild proximal colonic mural thickening with mucosal hyperemia may represent a mild colitis. Moderate colonic stool burden without evidence of obstruction. 2. Urinary bladder wall thickening versus underdistention. Correlate with urinary symptoms and consider urinalysis to exclude cystitis if present. 3. Stable mild prominence of the pancreatic duct, similar to comparison MRCP. 4. Aortic Atherosclerosis (ICD10-I70.0). Electronically Signed   By: Lovena Le M.D.   On: 01/07/2020 15:06   DG Chest Port 1 View  Result Date: 01/07/2020 CLINICAL DATA:  Shortness of breath, constipated for 4 days EXAM: PORTABLE CHEST 1 VIEW COMPARISON:  07/18/2019, 12/15/2018 FINDINGS: No consolidation, features of edema, pneumothorax, or effusion. Pulmonary vascularity is normally distributed. The cardiomediastinal contours are unremarkable. No acute osseous or soft tissue abnormality. Left axillary surgical clips and vascular stent similar to prior. Degenerative changes are present in the imaged spine and shoulders. IMPRESSION: No acute cardiopulmonary abnormality. Electronically Signed   By: Lovena Le M.D.   On: 01/07/2020 14:59     Scheduled Meds: . amLODipine  10 mg Oral Q1200  . bisacodyl  10 mg Rectal Once  . Chlorhexidine Gluconate Cloth  6 each Topical Q0600  .  Chlorhexidine Gluconate Cloth  6 each Topical Q0600  . cloNIDine  0.1 mg Oral BID  . [START ON 01/10/2020] doxercalciferol  2 mcg Intravenous Q T,Th,Sa-HD  . heparin  5,000 Units Subcutaneous Q8H  .  [START ON 01/10/2020] lidocaine-prilocaine  1 application Topical Q T,Th,Sa-HD  . multivitamin  1 tablet Oral Daily  . senna-docusate  1 tablet Oral QHS  . sorbitol  15 mL Oral Daily   Continuous Infusions:   LOS: 1 day    Time spent: 25  Nita Sells, MD [facility] To contact the attending provider between 7A-7P or the covering provider during after hours 7P-7A, please log into the web site [email or web address] and access using universal [place] password for that web site. If you do not have the password, please call the hospital operator.  01/09/2020, 10:45 AM

## 2020-01-09 NOTE — Telephone Encounter (Signed)
-----   Message from [name], MD sent at 01/09/2020  2:37 PM EDT ----- Ashley [name], she is currently hospitalized but I want to get her on the books for outpatient EUS for recurrent pancreatitis. Thursday August 26th after the other 4 EUSs that morning.  Thanks

## 2020-01-10 ENCOUNTER — Other Ambulatory Visit: Payer: Self-pay

## 2020-01-10 DIAGNOSIS — K858 Other acute pancreatitis without necrosis or infection: Secondary | ICD-10-CM

## 2020-01-10 LAB — BASIC METABOLIC PANEL
Anion gap: 11 (ref 5–15)
BUN: 34 mg/dL — ABNORMAL HIGH (ref 8–23)
CO2: 27 mmol/L (ref 22–32)
Calcium: 8.7 mg/dL — ABNORMAL LOW (ref 8.9–10.3)
Chloride: 95 mmol/L — ABNORMAL LOW (ref 98–111)
Creatinine, Ser: 11.02 mg/dL — ABNORMAL HIGH (ref 0.44–1.00)
GFR calc Af Amer: 4 mL/min — ABNORMAL LOW (ref >60)
GFR calc non Af Amer: 3 mL/min — ABNORMAL LOW (ref >60)
Glucose, Bld: 74 mg/dL (ref 70–99)
Potassium: 5.1 mmol/L (ref 3.5–5.1)
Sodium: 133 mmol/L — ABNORMAL LOW (ref 135–145)

## 2020-01-10 LAB — CBC
HCT: 33.7 % — ABNORMAL LOW (ref 36.0–46.0)
Hemoglobin: 11.1 g/dL — ABNORMAL LOW (ref 12.0–15.0)
MCH: 31.4 pg (ref 26.0–34.0)
MCHC: 32.9 g/dL (ref 30.0–36.0)
MCV: 95.2 fL (ref 80.0–100.0)
Platelets: 272 10*3/uL (ref 150–400)
RBC: 3.54 MIL/uL — ABNORMAL LOW (ref 3.87–5.11)
RDW: 13.6 % (ref 11.5–15.5)
WBC: 6.8 10*3/uL (ref 4.0–10.5)
nRBC: 0 % (ref 0.0–0.2)

## 2020-01-10 MED ORDER — PENTAFLUOROPROP-TETRAFLUOROETH EX AERO: 1.0000 "application " | INHALATION_SPRAY | CUTANEOUS | Status: DC | PRN

## 2020-01-10 MED ORDER — HEPARIN SODIUM (PORCINE) 1000 UNIT/ML IJ SOLN
INTRAMUSCULAR | Status: AC
  Administered 2020-01-10: 2000 [IU]
  Filled 2020-01-10: qty 2

## 2020-01-10 MED ORDER — POLYETHYLENE GLYCOL 3350 17 G PO PACK
17.0000 g | PACK | Freq: Every day | ORAL | Status: DC
  Filled 2020-01-10: qty 1

## 2020-01-10 MED ORDER — HEPARIN SODIUM (PORCINE) 1000 UNIT/ML DIALYSIS: 1000.0000 [IU] | INTRAMUSCULAR | Status: DC | PRN

## 2020-01-10 MED ORDER — LIDOCAINE-PRILOCAINE 2.5-2.5 % EX CREA: 1.0000 "application " | TOPICAL_CREAM | CUTANEOUS | Status: DC | PRN

## 2020-01-10 MED ORDER — SODIUM CHLORIDE 0.9 % IV SOLN: 100.0000 mL | INTRAVENOUS | Status: DC | PRN

## 2020-01-10 MED ORDER — HEPARIN SODIUM (PORCINE) 1000 UNIT/ML DIALYSIS
1000.0000 [IU] | INTRAMUSCULAR | Status: DC | PRN
Start: 2020-01-10 — End: 2020-01-10

## 2020-01-10 MED ORDER — LIDOCAINE HCL (PF) 1 % IJ SOLN: 5.0000 mL | INTRAMUSCULAR | Status: DC | PRN

## 2020-01-10 MED ORDER — DOXERCALCIFEROL 4 MCG/2ML IV SOLN
INTRAVENOUS | Status: AC
  Administered 2020-01-10: 2 ug via INTRAVENOUS
  Filled 2020-01-10: qty 2

## 2020-01-10 MED ORDER — SORBITOL 70 % SOLN: 15.0000 mL | Freq: Every day | Status: DC

## 2020-01-10 MED ORDER — ALTEPLASE 2 MG IJ SOLR
2.0000 mg | Freq: Once | INTRAMUSCULAR | Status: DC | PRN
Start: 2020-01-10 — End: 2020-01-10

## 2020-01-10 MED ORDER — SODIUM CHLORIDE 0.9 % IV SOLN
100.0000 mL | INTRAVENOUS | Status: DC | PRN
Start: 2020-01-10 — End: 2020-01-10

## 2020-01-10 MED ORDER — LIDOCAINE HCL (PF) 1 % IJ SOLN
5.0000 mL | INTRAMUSCULAR | Status: DC | PRN
Start: 2020-01-10 — End: 2020-01-10

## 2020-01-10 MED ORDER — LIDOCAINE-PRILOCAINE 2.5-2.5 % EX CREA
1.0000 | TOPICAL_CREAM | CUTANEOUS | Status: DC | PRN
Start: 2020-01-10 — End: 2020-01-10

## 2020-01-10 MED ORDER — FAMOTIDINE 20 MG PO TABS
20.0000 mg | ORAL_TABLET | Freq: Every day | ORAL | Status: DC
  Administered 2020-01-10 – 2020-01-11 (×2): 20 mg via ORAL
  Filled 2020-01-10 (×2): qty 1

## 2020-01-10 MED ORDER — HEPARIN SODIUM (PORCINE) 1000 UNIT/ML DIALYSIS: 2000.0000 [IU] | INTRAMUSCULAR | Status: DC | PRN

## 2020-01-10 MED ORDER — ALTEPLASE 2 MG IJ SOLR: 2.0000 mg | Freq: Once | INTRAMUSCULAR | Status: DC | PRN

## 2020-01-10 NOTE — Progress Notes (Signed)
PROGRESS NOTE    Ashley [name]  EHU:[id number] DOB: 06-Apr-1951 DOA: 01/07/2020 PCP: Sonia Side., FNP  Brief Narrative:  69 year old black female ESRD TTS?  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 Stann Mainland Reflux HTN HLD  Came to [facility] hospital abdominal pain nausea vomiting cramping Took laxatives Tuesday no good response On exam lipase 67 CT showed pancreatic duct prominence with tapering similar to prior MRCP  Assessment & Plan:   Active Problems:   Pancreatitis   Colitis   1. Abdominal pain most likely secondary to pancreatitis DDx a. Recalcitrant and intermittent symptoms-continue OxyIR 5 mg every 4 as needed with breakthrough Dilaudid 0.5 b. Cannot discharge reliably until pain is controlled without IV meds-attempting to address multiple components of pain-do not think this is visceral or reflux but will add Pepcid in addition today to Protonix started yesterday (she can tolerate Protonix unclear why she was not given this previously or why she was intolerant)-cannot use Carafate as ESRD patient c. If able to control pain within the next several days likely can discharge home-otherwise may need to consider GI work-up here instead of on 8/26 in the office given her multiple readmissions d. SHE DOES NOT HAVE COLITIS-the characterization of her pain is upper quadrant not lower quadrant and she has no white count 2. ESRD TTS/secondary 3. Mild hyperkalemia mild hyponatremia 4. Hyperparathyroidism/normocytic anemia  a. Defer to renal 5. Chronic constipation a. Continue laxatives i. MiraLAX daily scheduled with senna ii. Reduced dose of sorbitol daily nightly if no stool with  that iii. Final resort Dulcolax suppositories 6. Reflux a. We will resume Protonix--see above discussion 7. HTN a. Continue amlodipine 10, clonidine 0.1 twice daily b. Adjust meds as needed c. Patient noncompliant on metoprolol XL 8. HLD 9. Recent closed radial fracture 09/2019  DVT prophylaxis: Lovenox Code Status: Full Family Communication: None present Disposition:   Status is: Observation  The patient will require care spanning > 2 midnights and should be moved to inpatient because: Hemodynamically unstable and IV treatments appropriate due to intensity of illness or inability to take PO  Dispo: The patient is from: Home              Anticipated d/c is to: Home              Anticipated d/c date is: 2 days patient lives alone at home and has been readmitted several times-anticipate 24 to 48-hour timing of discharge              Patient currently is medically stable to d/c.       Consultants:   Renal  Procedures: Multiple  Antimicrobials: None   Subjective:  Managed to eat breakfast is tolerating some solid foods but with severe pain at times Needed IV Dilaudid yesterday and is taking p.o. pain meds in addition  Objective: Vitals:   01/09/20 1951 01/10/20 0436 01/10/20 0831 01/10/20 0949  BP: 124/61 135/73 (!) 130/57 (!) 130/57  Pulse: 76 67 69   Resp: 17 17 17    Temp: 98.4 F (36.9 C) 98.4 F (36.9 C) 98.3 F (36.8 C)   TempSrc: Oral Oral Oral   SpO2: 98% 95% 100%   Weight:      Height:  No intake or output data in the 24 hours ending 01/10/20 1014 Filed Weights   01/07/20 2008 01/08/20 1025 01/08/20 1325  Weight: 59.6 kg 60.9 kg 59.5 kg    Examination:  Coherent no distress Chest clear Tenderness in upper quadrant of abdomen at xiphisternum No rebound no guarding slight distention Neurologically intact no focal deficit Musculoskeletal intact Skin soft supple  Data Reviewed: I have personally reviewed following labs and imaging  studies Potassium 5.1 White count 6 Platelet 272  Radiology Studies: No results found.   Scheduled Meds: . amLODipine  10 mg Oral Q1200  . bisacodyl  10 mg Rectal Once  . Chlorhexidine Gluconate Cloth  6 each Topical Q0600  . cloNIDine  0.1 mg Oral BID  . doxercalciferol  2 mcg Intravenous Q T,Th,Sa-HD  . famotidine  20 mg Oral Daily  . feeding supplement  1 Container Oral TID BM  . heparin  5,000 Units Subcutaneous Q8H  . lidocaine-prilocaine  1 application Topical Q T,Th,Sa-HD  . multivitamin  1 tablet Oral Daily  . pantoprazole  40 mg Oral BID  . senna-docusate  1 tablet Oral QHS  . sorbitol  15 mL Oral Daily   Continuous Infusions:   LOS: 2 days    Time spent: 27  Nita Sells, MD [facility] To contact the attending provider between 7A-7P or the covering provider during after hours 7P-7A, please log into the web site [email or web address] and access using universal [place] password for that web site. If you do not have the password, please call the hospital operator.  01/10/2020, 10:14 AM

## 2020-01-10 NOTE — Progress Notes (Signed)
PROGRESS NOTE    Ashley [name]  ZOX:[id number] DOB: 06/29/1950 DOA: 01/07/2020 PCP: Sonia Side., FNP  Brief Narrative:  69 year old black female ESRD TTS?  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 Stann Mainland Reflux HTN HLD  Came to [facility] hospital abdominal pain nausea vomiting cramping Took laxatives Tuesday no good response On exam lipase 67 CT showed pancreatic duct prominence with tapering similar to prior MRCP  Assessment & Plan:   Active Problems:   Pancreatitis   Colitis   1. Abdominal pain most likely secondary to pancreatitis DDx a. Recalcitrant and intermittent symptoms-continue OxyIR 5 mg every 4 as needed with breakthrough Dilaudid 0.5 b. Cannot discharge reliably until pain is controlled without IV meds-attempting to address multiple components of pain-do not think this is visceral or reflux but will add Pepcid in addition today to Protonix started yesterday (she can tolerate Protonix unclear why she was not given this previously or why she was intolerant)-cannot use Carafate as ESRD patient c. If able to control pain within the next several days likely can discharge home-otherwise may need to consider GI work-up here instead of on 8/26 in the office given her multiple readmissions d. SHE DOES NOT HAVE COLITIS-the characterization of her pain is upper quadrant not lower quadrant and she has no white count 2. ESRD TTS/secondary 3. Mild hyperkalemia mild hyponatremia 4. Hyperparathyroidism/normocytic anemia  a. Defer to renal 5. Treated hepatitis C (Epclusa) a. HCV quant in progress b. Defer to gastroenterology further planning 6. Chronic constipation a. Continue  laxatives i. MiraLAX daily scheduled with senna ii. Reduced dose of sorbitol daily nightly if no stool with that iii. Final resort Dulcolax suppositories 7. Reflux a. We will resume Protonix--see above discussion 8. HTN a. Continue amlodipine 10, clonidine 0.1 twice daily b. Adjust meds as needed c. Patient noncompliant on metoprolol XL 9. HLD 10. Recent closed radial fracture 09/2019  DVT prophylaxis: Lovenox Code Status: Full Family Communication: None present Disposition:   Status is: Observation  The patient will require care spanning > 2 midnights and should be moved to inpatient because: Hemodynamically unstable and IV treatments appropriate due to intensity of illness or inability to take PO  Dispo: The patient is from: Home              Anticipated d/c is to: Home              Anticipated d/c date is: 2 days patient lives alone at home and has been readmitted several times-anticipate 24 to 48-hour timing of discharge              Patient currently is medically stable to d/c.       Consultants:   Renal  Procedures: Multiple  Antimicrobials: None   Subjective:  Managed to eat breakfast is tolerating some solid foods but with severe pain at times Needed IV Dilaudid yesterday and is taking p.o. pain meds in addition  Objective: Vitals:   01/09/20 1951 01/10/20 0436 01/10/20 0831 01/10/20 0949  BP: 124/61 135/73 (!) 130/57 (!) 130/57  Pulse: 76 67 69   Resp: 17 17 17    Temp: 98.4 F (36.9 C) 98.4 F (36.9 C) 98.3 F (36.8 C)   TempSrc: Oral Oral Oral   SpO2: 98% 95%  100%   Weight:      Height:       No intake or output data in the 24 hours ending 01/10/20 1023 Filed Weights   01/07/20 2008 01/08/20 1025 01/08/20 1325  Weight: 59.6 kg 60.9 kg 59.5 kg    Examination:  Coherent no distress Chest clear Tenderness in upper quadrant of abdomen at xiphisternum No rebound no guarding slight distention Neurologically intact no focal  deficit Musculoskeletal intact Skin soft supple  Data Reviewed: I have personally reviewed following labs and imaging studies Potassium 5.1 White count 6 Platelet 272  Radiology Studies: No results found.   Scheduled Meds: . amLODipine  10 mg Oral Q1200  . bisacodyl  10 mg Rectal Once  . Chlorhexidine Gluconate Cloth  6 each Topical Q0600  . cloNIDine  0.1 mg Oral BID  . doxercalciferol  2 mcg Intravenous Q T,Th,Sa-HD  . famotidine  20 mg Oral Daily  . feeding supplement  1 Container Oral TID BM  . heparin  5,000 Units Subcutaneous Q8H  . lidocaine-prilocaine  1 application Topical Q T,Th,Sa-HD  . multivitamin  1 tablet Oral Daily  . pantoprazole  40 mg Oral BID  . polyethylene glycol  17 g Oral Daily  . senna-docusate  1 tablet Oral QHS  . [START ON 01/11/2020] sorbitol  15 mL Oral QHS   Continuous Infusions:   LOS: 2 days    Time spent: [place], MD [facility] To contact the attending provider between 7A-7P or the covering provider during after hours 7P-7A, please log into the web site [email or web address] and access using universal [place] password for that web site. If you do not have the password, please call the hospital operator.  01/10/2020, 10:23 AM

## 2020-01-10 NOTE — Plan of Care (Signed)
  Problem: Education: Goal: Knowledge of Pancreatitis treatment and prevention will improve Outcome: Progressing   Problem: Health Behavior/Discharge Planning: Goal: Ability to formulate a plan to maintain an alcohol-free life will improve Outcome: Progressing   Problem: Nutritional: Goal: Ability to achieve adequate nutritional intake will improve Outcome: Progressing   Problem: Clinical Measurements: Goal: Complications related to the disease process, condition or treatment will be avoided or minimized Outcome: Progressing   Problem: Education: Goal: Knowledge of General Education information will improve Description: Including pain rating scale, medication(s)/side effects and non-pharmacologic comfort measures Outcome: Progressing   Problem: Health Behavior/Discharge Planning: Goal: Ability to manage health-related needs will improve Outcome: Progressing   Problem: Clinical Measurements: Goal: Ability to maintain clinical measurements within normal limits will improve Outcome: Progressing Goal: Will remain free from infection Outcome: Progressing Goal: Diagnostic test results will improve Outcome: Progressing Goal: Respiratory complications will improve Outcome: Progressing Goal: Cardiovascular complication will be avoided Outcome: Progressing   Problem: Activity: Goal: Risk for activity intolerance will decrease Outcome: Progressing   Problem: Nutrition: Goal: Adequate nutrition will be maintained Outcome: Progressing   Problem: Coping: Goal: Level of anxiety will decrease Outcome: Progressing   Problem: Elimination: Goal: Will not experience complications related to bowel motility Outcome: Progressing Goal: Will not experience complications related to urinary retention Outcome: Progressing   Problem: Pain Managment: Goal: General experience of comfort will improve Outcome: Progressing   Problem: Safety: Goal: Ability to remain free from injury will  improve Outcome: Progressing   Problem: Skin Integrity: Goal: Risk for impaired skin integrity will decrease Outcome: Progressing

## 2020-01-10 NOTE — Progress Notes (Addendum)
[facility] Progress Note   Subjective:   Patient seen and examined at bedside.  Reports abdominal pain all night but improved this AM "after nurse gave me a pill". (Oxycodone) Tolerated breakfast with no recurrent pain so far.  Denies CP, SOB, orthopnea, n/v/d, weakness and fatigue.  Had a BM yesterday.   Objective Vitals:   01/09/20 1520 01/09/20 1951 01/10/20 0436 01/10/20 0831  BP: (!) 148/68 124/61 135/73 (!) 130/57  Pulse: 81 76 67 69  Resp: 17 17 17 17   Temp: 98.2 F (36.8 C) 98.4 F (36.9 C) 98.4 F (36.9 C) 98.3 F (36.8 C)  TempSrc: Oral Oral Oral Oral  SpO2: 98% 98% 95% 100%  Weight:      Height:       Physical Exam General:WDWN female, lying comfortably in bed in NAD Heart:RRR, no mrg Lungs:CTAB, nml WOB Abdomen:soft, NTND, +BS Extremities:no LE edema Dialysis Access: L AVF +b   Filed Weights   01/07/20 2008 01/08/20 1025 01/08/20 1325  Weight: 59.6 kg 60.9 kg 59.5 kg   No intake or output data in the 24 hours ending 01/10/20 0852  Additional Objective Labs: Basic Metabolic Panel: Recent Labs  Lab 01/07/20 0502 01/09/20 0412 01/09/20 0813 01/10/20 0240  NA 127*  --  132* 133*  K 5.1  --  5.1 5.1  CL 88*  --  94* 95*  CO2 22  --  28 27  GLUCOSE 128*  --  110* 74  BUN 52*  --  24* 34*  CREATININE 13.26*  --  9.47* 11.02*  CALCIUM 9.2  --  9.1 8.7*  PHOS  --  4.7*  --   --    Liver Function Tests: Recent Labs  Lab 01/07/20 0502 01/09/20 0813  AST 16 14*  ALT 11 10  ALKPHOS 110 103  BILITOT 0.5 0.8  PROT 7.6 7.1  ALBUMIN 3.6 3.1*   Recent Labs  Lab 01/07/20 0502 01/09/20 0813  LIPASE 69* 52*   CBC: Recent Labs  Lab 01/07/20 0502 01/09/20 0813 01/10/20 0240  WBC 9.0 7.2 6.8  NEUTROABS  --  3.7  --   HGB 12.7 11.7* 11.1*  HCT 38.6 35.7* 33.7*  MCV 96.0 94.4 95.2  PLT 288 293 272    Medications:  . amLODipine  10 mg Oral Q1200  . bisacodyl  10 mg Rectal Once  . Chlorhexidine Gluconate Cloth  6 each Topical  Q0600  . cloNIDine  0.1 mg Oral BID  . doxercalciferol  2 mcg Intravenous Q T,Th,Sa-HD  . feeding supplement  1 Container Oral TID BM  . heparin  5,000 Units Subcutaneous Q8H  . lidocaine-prilocaine  1 application Topical Q T,Th,Sa-HD  . multivitamin  1 tablet Oral Daily  . pantoprazole  40 mg Oral BID  . senna-docusate  1 tablet Oral QHS  . sorbitol  15 mL Oral Daily    Dialysis Orders: TTS 3:30 - 160 dialyzer- BFR 350/DFR 800/EDW 59.5- 2K, 2.25 calc Profile #4 - AVF left - Heparin - yes- hectorol 2 mcg Last hgb 11.7- no ESA- Calc 9.1, phos 4.7  Assessment/Plan: 1. Abdominal pain - CT 7/31 showed possible colitis, mod stool burden, mild prominence of the pancreatic duct. Plans for EUS per GI. Pain management per primary. 2. ESRD - TTS.  Orders written for HD today per regular schedule.  K 5.1, use 2K bath. 3. Anemia of CKD- Hgb 11.1. No indication for ESA at this time. Follow labs. 4. Secondary hyperparathyroidism -last CCa  and phos at goal.  Restart Sensipar and Auryxia. Will plan on continuing Hectorol 42mcg during HD.  5. HTN/volume - BP well controlled today. Currently on Amlodipine and Clonidine. Does not appear volume overloaded.  Plan for UF 1-2L as tolerated today. 6. Nutrition - Patient is currently on soft diet. Albumin 3.1. Continue feeding supplement, nepro carb steady. 7. Hyponatremia - NA low upon admission but has been trending up.   Jen Mow, PA-C [facility] 01/10/2020,8:52 AM  LOS: 2 days   I have seen and examined this patient and agree with plan and assessment in the above note with renal recommendations/intervention highlighted.  Plan for Hd today on schedule.  Still with some abdominal pain which started last night after dinner.   Broadus John A Zackory Pudlo,MD 01/10/2020 10:43 AM

## 2020-01-10 NOTE — H&P (View-Only) (Signed)
Daily Rounding Note  01/10/2020, 9:19 AM  LOS: 2 days   SUBJECTIVE:   Chief complaint:  Abdominal pain.     Upper abdominal pain persists, marginally improved this AM, not worse after solid breakfast.  Pain not that well relieved w oxycodone and Dilaudid.  No nausea.  BM yesterday.   OBJECTIVE:         Vital signs in last 24 hours:    Temp:  [98.2 F (36.8 C)-98.4 F (36.9 C)] 98.3 F (36.8 C) (08/03 0831) Pulse Rate:  [67-81] 69 (08/03 0831) Resp:  [17] 17 (08/03 0831) BP: (124-148)/(57-73) 130/57 (08/03 0831) SpO2:  [95 %-100 %] 100 % (08/03 0831) Last BM Date: 01/09/20 [facility] Weights   01/07/20 2008 01/08/20 1025 01/08/20 1325  Weight: 59.6 kg 60.9 kg 59.5 kg   General: pleasant, mildly uncomfortable, chronically ill looking   Heart: RRR Chest: clear bil.   Abdomen: soft, ND, active BS.  Tender across upper abdomen without G/R.  Extremities: no CCE Neuro/Psych:  Oriented x 3.  Alert.  Moves all 4s, no tremors.    Lab Results: Recent Labs    01/09/20 0813 01/10/20 0240  WBC 7.2 6.8  HGB 11.7* 11.1*  HCT 35.7* 33.7*  PLT 293 272   BMET Recent Labs    01/09/20 0813 01/10/20 0240  NA 132* 133*  K 5.1 5.1  CL 94* 95*  CO2 28 27  GLUCOSE 110* 74  BUN 24* 34*  CREATININE 9.47* 11.02*  CALCIUM 9.1 8.7*   LFT Recent Labs    01/09/20 0813  PROT 7.1  ALBUMIN 3.1*  AST 14*  ALT 10  ALKPHOS 103  BILITOT 0.8   PT/INR No results for input(s): LABPROT, INR in the last 72 hours. Hepatitis Panel No results for input(s): HEPBSAG, HCVAB, HEPAIGM, HEPBIGM in the last 72 hours.  Studies/Results: No results found.   Scheduled Meds: . amLODipine  10 mg Oral Q1200  . bisacodyl  10 mg Rectal Once  . Chlorhexidine Gluconate Cloth  6 each Topical Q0600  . cloNIDine  0.1 mg Oral BID  . doxercalciferol  2 mcg Intravenous Q T,Th,Sa-HD  . feeding supplement  1 Container Oral TID BM  . heparin  5,000 Units  Subcutaneous Q8H  . lidocaine-prilocaine  1 application Topical Q T,Th,Sa-HD  . multivitamin  1 tablet Oral Daily  . pantoprazole  40 mg Oral BID  . senna-docusate  1 tablet Oral QHS  . sorbitol  15 mL Oral Daily   Continuous Infusions: PRN Meds:.acetaminophen **OR** acetaminophen, feeding supplement (NEPRO CARB STEADY), HYDROmorphone (DILAUDID) injection, ondansetron **OR** ondansetron (ZOFRAN) IV, oxyCODONE, polyethylene glycol, pregabalin   ASSESMENT:   *   Idiopathic pancreatitis.  Recurrent pain.   Lipase 69 >> 52.  LFTs normal.     7/31 CTAP ? Mild proximal colitis? Stable mild PD prominence.  No acute pancreatitis.  GB intact, unremarkable.  Biliary tree unremarkable.  No previous colonoscopy.      *   Anemia, mild, stable Hgb.  On Aurixia  *   Hep C.  Completed Epclusa earlier this year.   HCV quant in process.    *   ESRD.  HD on TTS.     PLAN   *   EUS scheduled for 8/26.    Hopefully will be ready for discharge home in next few days.  No changes to current mgt.   Normally push IVF in pt's w pancreatitis but  w her ESRD, fluid mgt per renal.  Currently no IVF.      Azucena Freed  01/10/2020, 9:19 AM Phone [phone]   ________________________________________________________________________  [name] GI MD note:  I personally examined the patient, reviewed the data and agree with the assessment and plan described above.  She's having mild abd pains but tolerated eggs and bacon for BF this AM.  I think she is probably safe for d/c later today or tomorrow.  She is scheduled for EUS evaluation of her pancreas in 3 weeks.  Please call or page with any further questions or concerns.    Owens Loffler, MD [facility]astroenterology Pager [phone]

## 2020-01-10 NOTE — Progress Notes (Addendum)
Daily Rounding Note  01/10/2020, 9:19 AM  LOS: 2 days   SUBJECTIVE:   Chief complaint:  Abdominal pain.     Upper abdominal pain persists, marginally improved this AM, not worse after solid breakfast.  Pain not that well relieved w oxycodone and Dilaudid.  No nausea.  BM yesterday.   OBJECTIVE:         Vital signs in last 24 hours:    Temp:  [98.2 F (36.8 C)-98.4 F (36.9 C)] 98.3 F (36.8 C) (08/03 0831) Pulse Rate:  [67-81] 69 (08/03 0831) Resp:  [17] 17 (08/03 0831) BP: (124-148)/(57-73) 130/57 (08/03 0831) SpO2:  [95 %-100 %] 100 % (08/03 0831) Last BM Date: 01/09/20 [facility] Weights   01/07/20 2008 01/08/20 1025 01/08/20 1325  Weight: 59.6 kg 60.9 kg 59.5 kg   General: pleasant, mildly uncomfortable, chronically ill looking   Heart: RRR Chest: clear bil.   Abdomen: soft, ND, active BS.  Tender across upper abdomen without G/R.  Extremities: no CCE Neuro/Psych:  Oriented x 3.  Alert.  Moves all 4s, no tremors.    Lab Results: Recent Labs    01/09/20 0813 01/10/20 0240  WBC 7.2 6.8  HGB 11.7* 11.1*  HCT 35.7* 33.7*  PLT 293 272   BMET Recent Labs    01/09/20 0813 01/10/20 0240  NA 132* 133*  K 5.1 5.1  CL 94* 95*  CO2 28 27  GLUCOSE 110* 74  BUN 24* 34*  CREATININE 9.47* 11.02*  CALCIUM 9.1 8.7*   LFT Recent Labs    01/09/20 0813  PROT 7.1  ALBUMIN 3.1*  AST 14*  ALT 10  ALKPHOS 103  BILITOT 0.8   PT/INR No results for input(s): LABPROT, INR in the last 72 hours. Hepatitis Panel No results for input(s): HEPBSAG, HCVAB, HEPAIGM, HEPBIGM in the last 72 hours.  Studies/Results: No results found.   Scheduled Meds:  amLODipine  10 mg Oral Q1200   bisacodyl  10 mg Rectal Once   Chlorhexidine Gluconate Cloth  6 each Topical Q0600   cloNIDine  0.1 mg Oral BID   doxercalciferol  2 mcg Intravenous Q T,Th,Sa-HD   feeding supplement  1 Container Oral TID BM   heparin  5,000 Units  Subcutaneous Q8H   lidocaine-prilocaine  1 application Topical Q T,Th,Sa-HD   multivitamin  1 tablet Oral Daily   pantoprazole  40 mg Oral BID   senna-docusate  1 tablet Oral QHS   sorbitol  15 mL Oral Daily   Continuous Infusions: PRN Meds:.acetaminophen **OR** acetaminophen, feeding supplement (NEPRO CARB STEADY), HYDROmorphone (DILAUDID) injection, ondansetron **OR** ondansetron (ZOFRAN) IV, oxyCODONE, polyethylene glycol, pregabalin   ASSESMENT:   *   Idiopathic pancreatitis.  Recurrent pain.   Lipase 69 >> 52.  LFTs normal.     7/31 CTAP ? Mild proximal colitis? Stable mild PD prominence.  No acute pancreatitis.  GB intact, unremarkable.  Biliary tree unremarkable.  No previous colonoscopy.      *   Anemia, mild, stable Hgb.  On Aurixia  *   Hep C.  Completed Epclusa earlier this year.   HCV quant in process.    *   ESRD.  HD on TTS.     PLAN   *   EUS scheduled for 8/26.    Hopefully will be ready for discharge home in next few days.  No changes to current mgt.   Normally push IVF in pt's w pancreatitis but  w her ESRD, fluid mgt per renal.  Currently no IVF.      Ashley [name]  01/10/2020, 9:19 AM Phone [phone]   ________________________________________________________________________  Ashley [name] GI MD note:  I personally examined the patient, reviewed the data and agree with the assessment and plan described above.  She's having mild abd pains but tolerated eggs and bacon for BF this AM.  I think she is probably safe for d/c later today or tomorrow.  She is scheduled for EUS evaluation of her pancreas in 3 weeks.  Please call or page with any further questions or concerns.    Owens Loffler, MD [facility] Gastroenterology Pager [phone]

## 2020-01-10 NOTE — Plan of Care (Signed)
  Problem: Pain Managment: Goal: General experience of comfort will improve Outcome: Progressing   Problem: Safety: Goal: Ability to remain free from injury will improve Outcome: Progressing   Problem: Skin Integrity: Goal: Risk for impaired skin integrity will decrease Outcome: Progressing   

## 2020-01-10 NOTE — Progress Notes (Signed)
Report given to Hemo-dialysis.

## 2020-01-10 NOTE — Telephone Encounter (Signed)
The information for the EUS has been mailed to the pt home.  She will also be given the information when discharged from the hospital.

## 2020-01-11 LAB — BASIC METABOLIC PANEL
Anion gap: 12 (ref 5–15)
BUN: 16 mg/dL (ref 8–23)
CO2: 26 mmol/L (ref 22–32)
Calcium: 9 mg/dL (ref 8.9–10.3)
Chloride: 98 mmol/L (ref 98–111)
Creatinine, Ser: 5.92 mg/dL — ABNORMAL HIGH (ref 0.44–1.00)
GFR calc Af Amer: 8 mL/min — ABNORMAL LOW (ref >60)
GFR calc non Af Amer: 7 mL/min — ABNORMAL LOW (ref >60)
Glucose, Bld: 93 mg/dL (ref 70–99)
Potassium: 4.8 mmol/L (ref 3.5–5.1)
Sodium: 136 mmol/L (ref 135–145)

## 2020-01-11 LAB — CBC WITH DIFFERENTIAL/PLATELET
Abs Immature Granulocytes: 0.03 10*3/uL (ref 0.00–0.07)
Basophils Absolute: 0 10*3/uL (ref 0.0–0.1)
Basophils Relative: 1 %
Eosinophils Absolute: 0.3 10*3/uL (ref 0.0–0.5)
Eosinophils Relative: 4 %
HCT: 38.7 % (ref 36.0–46.0)
Hemoglobin: 13.2 g/dL (ref 12.0–15.0)
Immature Granulocytes: 0 %
Lymphocytes Relative: 31 %
Lymphs Abs: 2.5 10*3/uL (ref 0.7–4.0)
MCH: 32.3 pg (ref 26.0–34.0)
MCHC: 34.1 g/dL (ref 30.0–36.0)
MCV: 94.6 fL (ref 80.0–100.0)
Monocytes Absolute: 1.2 10*3/uL — ABNORMAL HIGH (ref 0.1–1.0)
Monocytes Relative: 15 %
Neutro Abs: 3.9 10*3/uL (ref 1.7–7.7)
Neutrophils Relative %: 49 %
Platelets: 264 10*3/uL (ref 150–400)
RBC: 4.09 MIL/uL (ref 3.87–5.11)
RDW: 13.8 % (ref 11.5–15.5)
WBC: 7.9 10*3/uL (ref 4.0–10.5)
nRBC: 0 % (ref 0.0–0.2)

## 2020-01-11 LAB — HCV RNA QUANT: HCV Quantitative: NOT DETECTED IU/mL (ref >50)

## 2020-01-11 MED ORDER — FERRIC CITRATE 1 GM 210 MG(FE) PO TABS
210.0000 mg | ORAL_TABLET | Freq: Three times a day (TID) | ORAL | Status: DC
  Administered 2020-01-11 (×2): 210 mg via ORAL
  Filled 2020-01-11 (×2): qty 1

## 2020-01-11 MED ORDER — ACETAMINOPHEN 325 MG PO TABS: 650.0000 mg | ORAL_TABLET | Freq: Four times a day (QID) | ORAL | Status: DC | PRN

## 2020-01-11 MED ORDER — HYDROMORPHONE HCL 1 MG/ML IJ SOLN: 0.5000 mg | INTRAMUSCULAR | Status: DC | PRN

## 2020-01-11 MED ORDER — CINACALCET HCL 30 MG PO TABS
30.0000 mg | ORAL_TABLET | Freq: Every day | ORAL | Status: DC
  Administered 2020-01-11: 30 mg via ORAL
  Filled 2020-01-11: qty 1

## 2020-01-11 MED ORDER — SORBITOL 70 % SOLN: 15.0000 mL | Freq: Every day | 1 refills | Status: AC

## 2020-01-11 MED ORDER — SENNOSIDES-DOCUSATE SODIUM 8.6-50 MG PO TABS: 1.0000 | ORAL_TABLET | Freq: Every day | ORAL | 0 refills | Status: DC

## 2020-01-11 MED ORDER — CHLORHEXIDINE GLUCONATE CLOTH 2 % EX PADS
6.0000 | MEDICATED_PAD | Freq: Every day | CUTANEOUS | Status: DC
  Administered 2020-01-11: 6 via TOPICAL

## 2020-01-11 MED ORDER — OXYCODONE HCL 5 MG PO TABS: 5.0000 mg | ORAL_TABLET | ORAL | 0 refills | Status: DC | PRN

## 2020-01-11 MED ORDER — PANTOPRAZOLE SODIUM 40 MG PO TBEC: 40.0000 mg | DELAYED_RELEASE_TABLET | Freq: Two times a day (BID) | ORAL | 0 refills | Status: DC

## 2020-01-11 MED ORDER — FERRIC CITRATE 1 GM 210 MG(FE) PO TABS: 210.0000 mg | ORAL_TABLET | Freq: Three times a day (TID) | ORAL | 1 refills | Status: DC

## 2020-01-11 NOTE — Progress Notes (Signed)
Nutrition Follow-up  DOCUMENTATION CODES:   Not applicable  INTERVENTION:   -Continue renal MVI daily -Continue Boost Breeze po TID, each supplement provides 250 kcal and 9 grams of protein  NUTRITION DIAGNOSIS:   Inadequate oral intake related to altered GI function as evidenced by meal completion < 50%.  Ongoing  GOAL:   Patient will meet greater than or equal to 90% of their needs  Progressing   MONITOR:   PO intake, Supplement acceptance, Labs, Weight trends, Skin, I & O's  REASON FOR ASSESSMENT:   Malnutrition Screening Tool    ASSESSMENT:   Ashley [name] is a 69 y.o. female with medical history significant of recurrent pancreatitis (with a rather normal MRCP January 2021, and IgG4 negative in June 2021, and normal triglyceride 2021), ESRD on HD TTS, GERD, HTN, HLD, presented with on and off abdominal pain.  Symptoms started 5 days ago, on and off, cramping like, happens every time patient tries to eat something, last for 30-60 min, nonradiating, denied any fever chills no diarrhea  Reviewed I/O's: -2 L x 24 hours and -3.2 L since admission  Spoke with pt at bedside, who reports feeling better and states appetite is improving. She estimates she is consuming about 50% of her meals and food tolerance/ pain has improved as long as her pain medications are taken about an hour before her meal. She has been consuming her Boost Breeze supplements and states she likes them.   PTA, pt reports that her intake was erratic, especially on HD days ("sometime I would feel really bad when I got off the machine"). She endorses she has lost weight due to decreased appetite. Reviewed wt hx, pt has experienced a 3.3% wt loss over the past 6 months, which is not significant for time frame.  Discussed importance of good meal and supplement intake to promote healing.   Labs reviewed: Na: 133, Phos: 4.7.   Diet Order:   Diet Order            DIET SOFT Room service appropriate? Yes;  Fluid consistency: Thin  Diet effective now                 EDUCATION NEEDS:   No education needs have been identified at this time  Skin:  Skin Assessment: Reviewed RN Assessment  Last BM:  01/09/20  Height:   Ht Readings from Last 1 Encounters:  01/07/20 5\' 2"  (1.575 m)    Weight:   Wt Readings from Last 1 Encounters:  01/10/20 58.3 kg    Ideal Body Weight:  50 kg  BMI:  Body mass index is 23.51 kg/m.  Estimated Nutritional Needs:   Kcal:  7793-9030  Protein:  100-115 grams  Fluid:  1000 ml + UOP    Loistine Chance, RD, LDN, [place] Registered Dietitian II Certified Diabetes Care and Education Specialist Please refer to [facility] for RD and/or RD on-call/weekend/after hours pager

## 2020-01-11 NOTE — Plan of Care (Signed)
  Problem: Education: Goal: Knowledge of Pancreatitis treatment and prevention will improve Outcome: Progressing   Problem: Health Behavior/Discharge Planning: Goal: Ability to formulate a plan to maintain an alcohol-free life will improve Outcome: Progressing   Problem: Nutritional: Goal: Ability to achieve adequate nutritional intake will improve Outcome: Progressing

## 2020-01-11 NOTE — Discharge Summary (Addendum)
Physician Discharge Summary  Ashley [name] GYI:[id number] DOB: Sep 05, 1950 DOA: 01/07/2020  PCP: Sonia Side., FNP  Admit date: 01/07/2020 Discharge date: 01/11/2020  Admitted From: Home Disposition: Home  Recommendations for Outpatient Follow-up:  1. Follow up with PCP in 1-2 weeks 2. Please obtain BMP/CBC in one week  Discharge Condition: Stable CODE STATUS: Full Diet recommendation: Renal diet, low-fat, no alcohol diet  Brief/Interim Summary: 69 year old black female ESRD TTS?  On transplant list at [facility]. 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 Recent closed fracture 09/30/2019 radius followed by Dr. Stann Mainland. Came to [facility] hospital abdominal pain nausea vomiting cramping. Took laxatives Tuesday no good response. On exam lipase 67 CT showed pancreatic duct prominence with tapering similar to prior MRCP.  Patient admitted as above with acute intractable abdominal pain, concerning for recurrent pancreatitis.  Patient has had multiple episodes of pancreatitis, CT at intake showed prominence of the pancreatic duct, has plan for EUS on 826 with GI.  At this time her abdominal pain nausea vomiting are markedly improved on current regimen otherwise stable and agreeable for discharge home.  Patient will have close follow-up with PCP, nephrology as well as GI as scheduled in the next few weeks.  She will continue dialysis Tuesday Thursday Saturday per her schedule.  Otherwise had lengthy discussion at bedside about need for alcohol cessation, low-fat diet in the setting of recurrent pancreatitis.  He indicates that she eats well and does not drink but we discussed that even 1 drink or 1 bad meal could cause her pancreatitis to flareup again.  Discharge Diagnoses:  Active Problems:    Pancreatitis   Colitis    Discharge Instructions  Discharge Instructions    Call MD for:  difficulty breathing, headache or visual disturbances   Complete by: As directed    Call MD for:  extreme fatigue   Complete by: As directed    Call MD for:  persistant dizziness or light-headedness   Complete by: As directed    Call MD for:  persistant nausea and vomiting   Complete by: As directed    Call MD for:  redness, tenderness, or signs of infection (pain, swelling, redness, odor or green/yellow discharge around incision site)   Complete by: As directed    Call MD for:  severe uncontrolled pain   Complete by: As directed    Call MD for:  temperature >100.4   Complete by: As directed    Diet - low sodium heart healthy   Complete by: As directed    Increase activity slowly   Complete by: As directed      Allergies as of 01/11/2020      Reactions   Aspirin Nausea And Vomiting   Stomach ache   Ibuprofen Nausea And Vomiting   Stomach ache      Medication List    STOP taking these medications   metoprolol succinate 25 MG 24 hr tablet Commonly known as: TOPROL-XL     TAKE these medications   acetaminophen 325 MG tablet Commonly known as: TYLENOL Take 2 tablets (650 mg total) by mouth every 6 (six) hours as needed for mild pain (or Fever >/= 101).   amLODipine 10 MG tablet Commonly known as: NORVASC Take 1 tablet (10 mg total) by mouth daily. What changed: when to take this   cinacalcet 30  MG tablet Commonly known as: SENSIPAR Take 30 mg by mouth at bedtime.   cloNIDine 0.1 MG tablet Commonly known as: CATAPRES Take 0.1 mg by mouth 2 (two) times daily.   DIALYVITE 800 WITH ZINC 0.8 MG Tabs Take 1 tablet by mouth daily.   feeding supplement (NEPRO CARB STEADY) Liqd Take 237 mLs by mouth 3 (three) times daily as needed (Supplement). What changed:   when to take this  reasons to take this   ferric citrate 1 GM 210 MG(Fe) tablet Commonly known as: AURYXIA Take  1 tablet (210 mg total) by mouth 3 (three) times daily with meals. What changed: how much to take   lidocaine-prilocaine cream Commonly known as: EMLA Apply 1 application topically See admin instructions. Apply small amount to access site on Tuesday, Thursday, Saturday one hour before dialysis. Cover with occlusive dressing (saran wrap)   oxyCODONE 5 MG immediate release tablet Commonly known as: Oxy IR/ROXICODONE Take 1 tablet (5 mg total) by mouth every 4 (four) hours as needed for moderate pain.   pantoprazole 40 MG tablet Commonly known as: PROTONIX Take 1 tablet (40 mg total) by mouth 2 (two) times daily.   polyethylene glycol 17 g packet Commonly known as: MiraLax Take 17 g by mouth 2 (two) times daily.   pregabalin 75 MG capsule Commonly known as: LYRICA Take 75 mg by mouth 2 (two) times daily as needed (nerve pain).   senna-docusate 8.6-50 MG tablet Commonly known as: Senokot-S Take 1 tablet by mouth at bedtime.   sorbitol 70 % Soln Take 15 mLs by mouth at bedtime. What changed:   how much to take  when to take this  reasons to take this       Allergies  Allergen Reactions  . Aspirin Nausea And Vomiting    Stomach ache  . Ibuprofen Nausea And Vomiting    Stomach ache    Consultations:  Nephrology   Procedures/Studies: CT ABDOMEN PELVIS W CONTRAST  Result Date: 01/07/2020 CLINICAL DATA:  Constipation, abdominal pain EXAM: CT ABDOMEN AND PELVIS WITH CONTRAST TECHNIQUE: Multidetector CT imaging of the abdomen and pelvis was performed using the standard protocol following bolus administration of intravenous contrast. CONTRAST:  159mL OMNIPAQUE IOHEXOL 300 MG/ML  SOLN COMPARISON:  CT 11/11/2019, MRCP 07/09/2019 FINDINGS: Lower chest: Lung bases are clear. Normal heart size. No pericardial effusion. Atherosclerotic calcifications of the coronaries. Hepatobiliary: Some mild heterogeneity of the liver enhancement likely related to contrast timing. No focal  concerning lesions. Smooth surface contour. Normal gallbladder and biliary tree without visible calcified gallstone. Pancreas: Mild prominence of the pancreatic duct towards the level of the body with tapering at the level of the head is similar to comparison MRCP without discernible lesion on either examination. No peripancreatic inflammation. Spleen: Normal splenic size. A subcentimeter hypoattenuating focus in the anterior spleen too small to fully characterize on CT imaging but statistically likely benign. Small accessory splenule. Adrenals/Urinary Tract: Lobular thickening of the adrenal glands, likely senescent hyperplasia. No concerning nodules. Kidneys enhance symmetrically with some mildly delayed excretion. Numerous subcentimeter hypoattenuating foci too small to fully characterize on CT imaging but statistically likely benign. No worrisome renal lesions. No urolithiasis or hydronephrosis. Urinary bladder is largely decompressed at the time of exam and therefore poorly evaluated by CT imaging. Mild wall thickening, nonspecific, and perivesicular haze Stomach/Bowel: Distal esophagus, stomach and duodenal sweep are unremarkable. No small bowel wall thickening or dilatation. No evidence of obstruction. A normal appendix is visualized. Moderate colonic stool burden.  Mild proximal colonic mural thickening with mucosal hyperemia. Vascular/Lymphatic: Atherosclerotic calcifications within the abdominal aorta and branch vessels. No aneurysm or ectasia. No enlarged abdominopelvic lymph nodes. Reproductive: Uterus is surgically absent. No concerning adnexal lesions. Other: No abdominopelvic free fluid or free gas. No bowel containing hernias. Musculoskeletal: No acute osseous abnormality or suspicious osseous lesion. Multilevel degenerative changes are present in the imaged portions of the spine. Transitional lumbosacral anatomy. 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 IMPRESSION: 1. Mild proximal colonic mural thickening with mucosal hyperemia may represent a mild colitis. Moderate colonic stool burden without evidence of obstruction. 2. Urinary bladder wall thickening versus underdistention. Correlate with urinary symptoms and consider urinalysis to exclude cystitis if present. 3. Stable mild prominence of the pancreatic duct, similar to comparison MRCP. 4. Aortic Atherosclerosis (ICD10-I70.0). Electronically Signed   By: Lovena Le M.D.   On: 01/07/2020 15:06   DG Chest Port 1 View  Result Date: 01/07/2020 CLINICAL DATA:  Shortness of breath, constipated for 4 days EXAM: PORTABLE CHEST 1 VIEW COMPARISON:  07/18/2019, 12/15/2018 FINDINGS: No consolidation, features of edema, pneumothorax, or effusion. Pulmonary vascularity is normally distributed. The cardiomediastinal contours are unremarkable. No acute osseous or soft tissue abnormality. Left axillary surgical clips and vascular stent similar to prior. Degenerative changes are present in the imaged spine and shoulders. IMPRESSION: No acute cardiopulmonary abnormality. Electronically Signed   By: Lovena Le M.D.   On: 01/07/2020 14:59     Subjective: No acute issues or events overnight, abdominal pain essentially resolved on current regimen denies nausea, vomiting, diarrhea, constipation, headache, fevers, chills.   Discharge Exam: Vitals:   01/11/20 0821 01/11/20 1317  BP: (!) 117/55 125/68  Pulse: 82 79  Resp: 18 16  Temp: 98.4 F (36.9 C) 98.2 F (36.8 C)  SpO2: 100% 100%   Vitals:   01/10/20 1943 01/11/20 0331 01/11/20 0821 01/11/20 1317  BP: 108/60 114/68 (!) 117/55 125/68  Pulse: 73 74 82 79  Resp: 18 16 18 16   Temp: 98.5 F (36.9 C) 98.3 F (36.8 C) 98.4 F (36.9 C) 98.2 F (36.8 C)  TempSrc: Oral Oral Oral Oral  SpO2: 98% 100% 100% 100%  Weight:      Height:        General: Pt is alert, awake, not in acute distress Cardiovascular: RRR, S1/S2 +, no rubs,  no gallops Respiratory: CTA bilaterally, no wheezing, no rhonchi Abdominal: Soft, NT, ND, bowel sounds + Extremities: no edema, no cyanosis    The results of significant diagnostics from this hospitalization (including imaging, microbiology, ancillary and laboratory) are listed below for reference.     Microbiology: Recent Results (from the past 240 hour(s))  SARS Coronavirus 2 by RT PCR (hospital order, performed in [facility] hospital lab) Nasopharyngeal Nasopharyngeal Swab     Status: None   Collection Time: 01/07/20  4:34 PM   Specimen: Nasopharyngeal Swab  Result Value Ref Range Status   SARS Coronavirus 2 NEGATIVE NEGATIVE Final    Comment: (NOTE) SARS-CoV-2 target nucleic acids are NOT DETECTED.  The SARS-CoV-2 RNA is generally detectable in upper and lower respiratory specimens during the acute phase of infection. The lowest concentration of SARS-CoV-2 viral copies this assay can detect is 250 copies / mL. A negative result does not preclude SARS-CoV-2 infection and should not be used as the sole basis for treatment or other patient management decisions.  A negative result may occur with improper specimen collection /  handling, submission of specimen other than nasopharyngeal swab, presence of viral mutation(s) within the areas targeted by this assay, and inadequate number of viral copies (<250 copies / mL). A negative result must be combined with clinical observations, patient history, and epidemiological information.  Fact Sheet for Patients:   StrictlyIdeas.no  Fact Sheet for Healthcare Providers: BankingDealers.co.za  This test is not yet approved or  cleared by the [place] FDA and has been authorized for detection and/or diagnosis of SARS-CoV-2 by FDA under an Emergency Use Authorization (EUA).  This EUA will remain in effect (meaning this test can be used) for the duration of the COVID-19 declaration under  Section 564(b)(1) of the Act, 21 U.S.C. section 360bbb-3(b)(1), unless the authorization is terminated or revoked sooner.  Performed at [facility], [place] [address], [place], [place] [address]   MRSA PCR Screening     Status: None   Collection Time: 01/07/20  9:11 PM   Specimen: Nasopharyngeal  Result Value Ref Range Status   MRSA by PCR NEGATIVE NEGATIVE Final    Comment:        The GeneXpert MRSA Assay (FDA approved for NASAL specimens only), is one component of a comprehensive MRSA colonization surveillance program. It is not intended to diagnose MRSA infection nor to guide or monitor treatment for MRSA infections. Performed at [facility], [place] [address], [place], [place] [address]      Labs: BNP (last 3 results) Recent Labs    11/14/19 0721 11/15/19 0331  BNP 212.1* 767.2*   Basic Metabolic Panel: Recent Labs  Lab 01/07/20 0502 01/09/20 0412 01/09/20 0813 01/10/20 0240 01/11/20 0231  NA 127*  --  132* 133* 136  K 5.1  --  5.1 5.1 4.8  CL 88*  --  94* 95* 98  CO2 22  --  28 27 26   GLUCOSE 128*  --  110* 74 93  BUN 52*  --  24* 34* 16  CREATININE 13.26*  --  9.47* 11.02* 5.92*  CALCIUM 9.2  --  9.1 8.7* 9.0  PHOS  --  4.7*  --   --   --    Liver Function Tests: Recent Labs  Lab 01/07/20 0502 01/09/20 0813  AST 16 14*  ALT 11 10  ALKPHOS 110 103  BILITOT 0.5 0.8  PROT 7.6 7.1  ALBUMIN 3.6 3.1*   Recent Labs  Lab 01/07/20 0502 01/09/20 0813  LIPASE 69* 52*   No results for input(s): AMMONIA in the last 168 hours. CBC: Recent Labs  Lab 01/07/20 0502 01/09/20 0813 01/10/20 0240 01/11/20 0231  WBC 9.0 7.2 6.8 7.9  NEUTROABS  --  3.7  --  3.9  HGB 12.7 11.7* 11.1* 13.2  HCT 38.6 35.7* 33.7* 38.7  MCV 96.0 94.4 95.2 94.6  PLT 288 293 272 264   Cardiac Enzymes: No results for input(s): CKTOTAL, CKMB, CKMBINDEX, TROPONINI in the last 168 hours. BNP: Invalid input(s): POCBNP CBG: No results for input(s): GLUCAP in the  last 168 hours. D-Dimer No results for input(s): DDIMER in the last 72 hours. Hgb A1c No results for input(s): HGBA1C in the last 72 hours. Lipid Profile No results for input(s): CHOL, HDL, LDLCALC, TRIG, CHOLHDL, LDLDIRECT in the last 72 hours. Thyroid function studies No results for input(s): TSH, T4TOTAL, T3FREE, THYROIDAB in the last 72 hours.  Invalid input(s): FREET3 Anemia work up No results for input(s): VITAMINB12, FOLATE, FERRITIN, TIBC, IRON, RETICCTPCT in the last 72 hours. Urinalysis  Component Value Date/Time   COLORURINE YELLOW 12/31/2018 1014   APPEARANCEUR CLEAR 12/31/2018 1014   LABSPEC 1.008 12/31/2018 1014   PHURINE 9.0 (H) 12/31/2018 1014   GLUCOSEU NEGATIVE 12/31/2018 1014   HGBUR SMALL (A) 12/31/2018 [place] 12/31/2018 1014   BILIRUBINUR NEG 01/29/2016 1142   KETONESUR NEGATIVE 12/31/2018 1014   PROTEINUR >=300 (A) 12/31/2018 1014   UROBILINOGEN 0.2 01/29/2016 1142   UROBILINOGEN 0.2 03/21/2015 1649   NITRITE NEGATIVE 12/31/2018 1014   LEUKOCYTESUR NEGATIVE 12/31/2018 1014   Sepsis Labs Invalid input(s): PROCALCITONIN,  WBC,  LACTICIDVEN Microbiology Recent Results (from the past 240 hour(s))  SARS Coronavirus 2 by RT PCR (hospital order, performed in [place] hospital lab) Nasopharyngeal Nasopharyngeal Swab     Status: None   Collection Time: 01/07/20  4:34 PM   Specimen: Nasopharyngeal Swab  Result Value Ref Range Status   SARS Coronavirus 2 NEGATIVE NEGATIVE Final    Comment: (NOTE) SARS-CoV-2 target nucleic acids are NOT DETECTED.  The SARS-CoV-2 RNA is generally detectable in upper and lower respiratory specimens during the acute phase of infection. The lowest concentration of SARS-CoV-2 viral copies this assay can detect is 250 copies / mL. A negative result does not preclude SARS-CoV-2 infection and should not be used as the sole basis for treatment or other patient management decisions.  A negative result may  occur with improper specimen collection / handling, submission of specimen other than nasopharyngeal swab, presence of viral mutation(s) within the areas targeted by this assay, and inadequate number of viral copies (<250 copies / mL). A negative result must be combined with clinical observations, patient history, and epidemiological information.  Fact Sheet for Patients:   StrictlyIdeas.no  Fact Sheet for Healthcare Providers: BankingDealers.co.za  This test is not yet approved or  cleared by the [place] FDA and has been authorized for detection and/or diagnosis of SARS-CoV-2 by FDA under an Emergency Use Authorization (EUA).  This EUA will remain in effect (meaning this test can be used) for the duration of the COVID-19 declaration under Section 564(b)(1) of the Act, 21 U.S.C. section 360bbb-3(b)(1), unless the authorization is terminated or revoked sooner.  Performed at [facility], [place] [address], [place], [place] [address]   MRSA PCR Screening     Status: None   Collection Time: 01/07/20  9:11 PM   Specimen: Nasopharyngeal  Result Value Ref Range Status   MRSA by PCR NEGATIVE NEGATIVE Final    Comment:        The GeneXpert MRSA Assay (FDA approved for NASAL specimens only), is one component of a comprehensive MRSA colonization surveillance program. It is not intended to diagnose MRSA infection nor to guide or monitor treatment for MRSA infections. Performed at [facility], [place] [address] [place], [place] [address]      Time coordinating discharge: Over 30 minutes  SIGNED:   Little Ishikawa, DO [facility] 01/11/2020, 4:18 PM

## 2020-01-11 NOTE — Plan of Care (Signed)
  Problem: Health Behavior/Discharge Planning: Goal: Ability to formulate a plan to maintain an alcohol-free life will improve Outcome: Progressing   Problem: Nutritional: Goal: Ability to achieve adequate nutritional intake will improve Outcome: Progressing   Problem: Health Behavior/Discharge Planning: Goal: Ability to manage health-related needs will improve Outcome: Progressing   Problem: Clinical Measurements: Goal: Ability to maintain clinical measurements within normal limits will improve Outcome: Progressing   Problem: Activity: Goal: Risk for activity intolerance will decrease Outcome: Progressing   Problem: Nutrition: Goal: Adequate nutrition will be maintained Outcome: Progressing   Problem: Coping: Goal: Level of anxiety will decrease Outcome: Progressing   Problem: Elimination: Goal: Will not experience complications related to bowel motility Outcome: Progressing   Problem: Pain Managment: Goal: General experience of comfort will improve Outcome: Progressing   Problem: Safety: Goal: Ability to remain free from injury will improve Outcome: Progressing   Problem: Skin Integrity: Goal: Risk for impaired skin integrity will decrease Outcome: Progressing

## 2020-01-11 NOTE — Progress Notes (Signed)
Provided discharge education/instructions, all questions and concerns addressed, Pt not in distress, denies nausea, pain is well controlled 0/10, discharged home with belongings accompanied by daughter.

## 2020-01-11 NOTE — Progress Notes (Addendum)
[facility] Progress Note   Subjective:   Patient seen and examined at bedside.  Intermittent dull abdominal pain overnight and this AM.  Denies n/v/d, CP, SOB, dizziness and fatigue.      Objective Vitals:   01/10/20 1713 01/10/20 1943 01/11/20 0331 01/11/20 0821  BP: (!) 127/99 108/60 114/68 (!) 117/55  Pulse: (!) 104 73 74 82  Resp: 16 18 16 18   Temp:  98.5 F (36.9 C) 98.3 F (36.8 C) 98.4 F (36.9 C)  TempSrc:  Oral Oral Oral  SpO2:  98% 100% 100%  Weight: 58.3 kg     Height:       Physical Exam General:WDWN female in NAD Heart:RRR Lungs:CTAB, nml WOB Abdomen:soft, +tenderness, ND Extremities:no LE edema Dialysis Access: LU AVF +b   Filed Weights   01/08/20 1325 01/10/20 1319 01/10/20 1713  Weight: 59.5 kg 60.3 kg 58.3 kg    Intake/Output Summary (Last 24 hours) at 01/11/2020 0959 Last data filed at 01/11/2020 3614 Gross per 24 hour  Intake 240 ml  Output 2000 ml  Net -1760 ml    Additional Objective Labs: Basic Metabolic Panel: Recent Labs  Lab 01/07/20 0502 01/09/20 0412 01/09/20 0813 01/10/20 0240 01/11/20 0231  NA   < >  --  132* 133* 136  K   < >  --  5.1 5.1 4.8  CL   < >  --  94* 95* 98  CO2   < >  --  28 27 26   GLUCOSE   < >  --  110* 74 93  BUN   < >  --  24* 34* 16  CREATININE   < >  --  9.47* 11.02* 5.92*  CALCIUM   < >  --  9.1 8.7* 9.0  PHOS  --  4.7*  --   --   --    < > = values in this interval not displayed.   Liver Function Tests: Recent Labs  Lab 01/07/20 0502 01/09/20 0813  AST 16 14*  ALT 11 10  ALKPHOS 110 103  BILITOT 0.5 0.8  PROT 7.6 7.1  ALBUMIN 3.6 3.1*   Recent Labs  Lab 01/07/20 0502 01/09/20 0813  LIPASE 69* 52*   CBC: Recent Labs  Lab 01/07/20 0502 01/07/20 0502 01/09/20 0813 01/10/20 0240 01/11/20 0231  WBC 9.0   < > 7.2 6.8 7.9  NEUTROABS  --   --  3.7  --  3.9  HGB 12.7   < > 11.7* 11.1* 13.2  HCT 38.6   < > 35.7* 33.7* 38.7  MCV 96.0  --  94.4 95.2 94.6  PLT 288   < > 293  272 264   < > = values in this interval not displayed.    Medications:   amLODipine  10 mg Oral Q1200   bisacodyl  10 mg Rectal Once   Chlorhexidine Gluconate Cloth  6 each Topical Q0600   cloNIDine  0.1 mg Oral BID   doxercalciferol  2 mcg Intravenous Q T,Th,Sa-HD   famotidine  20 mg Oral Daily   feeding supplement  1 Container Oral TID BM   heparin  5,000 Units Subcutaneous Q8H   lidocaine-prilocaine  1 application Topical Q T,Th,Sa-HD   multivitamin  1 tablet Oral Daily   pantoprazole  40 mg Oral BID   polyethylene glycol  17 g Oral Daily   senna-docusate  1 tablet Oral QHS   sorbitol  15 mL Oral QHS  Dialysis Orders: TTS 3:30 - 160 dialyzer- BFR 350/DFR 800/EDW 59.5- 2K, 2.25 calc Profile #4 - AVF left - Heparin - yes- hectorol 2 mcg Last hgb11.7- no ESA- Calc9.1, phos4.7  Assessment/Plan: 1.Abdominal pain - likely pancreatitis. CT 7/31 mild prominence of the pancreatic duct. Plans for EUS on 8/26 as outpatient per GI. Pain management per primary. 2. ESRD - TTS.  HD yesterday tolerated well.  K 4.8.  Next HD 8/5. 3. Anemiaof CKD-Hgb 11.1. No indication for ESA at this time. Follow labs. 4. Secondary hyperparathyroidism-last CCa and phos at goal.  Restart Sensipar and Auryxia.Will plan on continuing Hectorol 36mcg during HD.  5.HTN/volume- BP well controlled today. Currently on Amlodipine and Clonidine.Does not appear volume overloaded.  Under dry weight yesterday if weights correct, may need to lower on d/c, follow. UF as tolerated.  6. Nutrition- Patient is currently on soft diet. Albumin 3.1. Continue feeding supplement, nepro carb steady. 7. Hyponatremia - resolved.  Jen Mow, PA-C [facility] 01/11/2020,9:59 AM  LOS: 3 days   I have seen and examined this patient and agree with plan and assessment in the above note with renal recommendations/intervention highlighted.  Will continue with HD on TTS schedule while she  remains an inpatient.  Broadus John A [name] Bortle,MD 01/11/2020 1:29 PM

## 2020-01-12 ENCOUNTER — Telehealth: Payer: Self-pay | Admitting: Nephrology

## 2020-01-12 NOTE — Telephone Encounter (Signed)
Transition of Care Contact from [place]   Date of Discharge: 01/11/20 Date of Contact: 01/12/20 Method of contact: phone Talked to patient   Patient contacted to discuss transition of care form recent hospitaliztion. Patient was admitted to [facility] from 7/31 to 01/11/20 with the discharge diagnosis of recurrent pancreatitis.  Medication changes were reviewed - metoprolol d/c.   Patient will follow up with is outpatient dialysis center 01/14/20, missed today because she had cancelled her ride thinking she was not going to be discharged.  Other follow up needs include appointment with GI on 8/26 for EUS.  No other needs identified.    Jen Mow, PA-C [facility] Pager: [phone]

## 2020-01-30 ENCOUNTER — Other Ambulatory Visit (HOSPITAL_COMMUNITY): Payer: Medicare Other

## 2020-02-01 ENCOUNTER — Other Ambulatory Visit (HOSPITAL_COMMUNITY)
Admission: RE | Admit: 2020-02-01 | Discharge: 2020-02-01 | Disposition: A | Discharge status: Home / Self Care | Payer: Medicare Other | Source: Ambulatory Visit | Attending: Gastroenterology | Admitting: Gastroenterology

## 2020-02-01 DIAGNOSIS — Z20822 Contact with and (suspected) exposure to covid-19: Secondary | ICD-10-CM | POA: Insufficient documentation

## 2020-02-01 DIAGNOSIS — Z01812 Encounter for preprocedural laboratory examination: Secondary | ICD-10-CM | POA: Insufficient documentation

## 2020-02-01 LAB — SARS CORONAVIRUS 2 (TAT 6-24 HRS): SARS Coronavirus 2: NEGATIVE

## 2020-02-02 ENCOUNTER — Encounter (HOSPITAL_COMMUNITY): Payer: Self-pay | Admitting: Gastroenterology

## 2020-02-02 ENCOUNTER — Other Ambulatory Visit: Payer: Self-pay

## 2020-02-02 ENCOUNTER — Ambulatory Visit (HOSPITAL_COMMUNITY): Payer: Medicare Other | Admitting: Certified Registered Nurse Anesthetist

## 2020-02-02 ENCOUNTER — Encounter (HOSPITAL_COMMUNITY)
Admission: RE | Disposition: A | Discharge status: Home / Self Care | Payer: Self-pay | Source: Home / Self Care | Attending: Gastroenterology

## 2020-02-02 ENCOUNTER — Ambulatory Visit (HOSPITAL_COMMUNITY)
Admission: RE | Admit: 2020-02-02 | Discharge: 2020-02-02 | Disposition: A | Discharge status: Home / Self Care | Payer: Medicare Other | Attending: Gastroenterology | Admitting: Gastroenterology

## 2020-02-02 DIAGNOSIS — I120 Hypertensive chronic kidney disease with stage 5 chronic kidney disease or end stage renal disease: Secondary | ICD-10-CM | POA: Insufficient documentation

## 2020-02-02 DIAGNOSIS — B1920 Unspecified viral hepatitis C without hepatic coma: Secondary | ICD-10-CM | POA: Diagnosis not present

## 2020-02-02 DIAGNOSIS — N186 End stage renal disease: Secondary | ICD-10-CM | POA: Insufficient documentation

## 2020-02-02 DIAGNOSIS — F17210 Nicotine dependence, cigarettes, uncomplicated: Secondary | ICD-10-CM | POA: Insufficient documentation

## 2020-02-02 DIAGNOSIS — Z79899 Other long term (current) drug therapy: Secondary | ICD-10-CM | POA: Insufficient documentation

## 2020-02-02 DIAGNOSIS — J449 Chronic obstructive pulmonary disease, unspecified: Secondary | ICD-10-CM | POA: Insufficient documentation

## 2020-02-02 DIAGNOSIS — D631 Anemia in chronic kidney disease: Secondary | ICD-10-CM | POA: Diagnosis not present

## 2020-02-02 DIAGNOSIS — Z992 Dependence on renal dialysis: Secondary | ICD-10-CM | POA: Insufficient documentation

## 2020-02-02 DIAGNOSIS — Z8673 Personal history of transient ischemic attack (TIA), and cerebral infarction without residual deficits: Secondary | ICD-10-CM | POA: Diagnosis not present

## 2020-02-02 DIAGNOSIS — R634 Abnormal weight loss: Secondary | ICD-10-CM | POA: Insufficient documentation

## 2020-02-02 DIAGNOSIS — K859 Acute pancreatitis without necrosis or infection, unspecified: Secondary | ICD-10-CM | POA: Insufficient documentation

## 2020-02-02 DIAGNOSIS — K219 Gastro-esophageal reflux disease without esophagitis: Secondary | ICD-10-CM | POA: Diagnosis not present

## 2020-02-02 DIAGNOSIS — K858 Other acute pancreatitis without necrosis or infection: Secondary | ICD-10-CM

## 2020-02-02 HISTORY — PX: ESOPHAGOGASTRODUODENOSCOPY (EGD) WITH PROPOFOL: SHX5813

## 2020-02-02 HISTORY — PX: EUS: SHX5427

## 2020-02-02 LAB — POCT I-STAT, CHEM 8
BUN: 39 mg/dL — ABNORMAL HIGH (ref 8–23)
Calcium, Ion: 0.97 mmol/L — ABNORMAL LOW (ref 1.15–1.40)
Chloride: 96 mmol/L — ABNORMAL LOW (ref 98–111)
Creatinine, Ser: 13 mg/dL — ABNORMAL HIGH (ref 0.44–1.00)
Glucose, Bld: 99 mg/dL (ref 70–99)
HCT: 39 % (ref 36.0–46.0)
Hemoglobin: 13.3 g/dL (ref 12.0–15.0)
Potassium: 5.7 mmol/L — ABNORMAL HIGH (ref 3.5–5.1)
Sodium: 135 mmol/L (ref 135–145)
TCO2: 27 mmol/L (ref 22–32)

## 2020-02-02 SURGERY — UPPER ENDOSCOPIC ULTRASOUND (EUS) RADIAL
Anesthesia: Monitor Anesthesia Care

## 2020-02-02 MED ORDER — SODIUM CHLORIDE 0.9 % IV SOLN: INTRAVENOUS | Status: DC

## 2020-02-02 MED ORDER — PROPOFOL 500 MG/50ML IV EMUL
INTRAVENOUS | Status: DC | PRN
  Administered 2020-02-02: 75 ug/kg/min via INTRAVENOUS

## 2020-02-02 MED ORDER — PROPOFOL 500 MG/50ML IV EMUL
INTRAVENOUS | Status: DC | PRN
  Administered 2020-02-02 (×2): 20 mg via INTRAVENOUS

## 2020-02-02 MED ORDER — LIDOCAINE HCL (CARDIAC) PF 100 MG/5ML IV SOSY
PREFILLED_SYRINGE | INTRAVENOUS | Status: DC | PRN
  Administered 2020-02-02: 50 mg via INTRATRACHEAL

## 2020-02-02 NOTE — Op Note (Signed)
[facility] Patient Name: Ashley [name] Procedure Date: 02/02/2020 MRN: [id number] Attending MD: [name] , MD Date of Birth: 12-Nov-1950 CSN: [id number] Age: 69 Admit Type: Outpatient Procedure:                Upper EUS Indications:              Acute recurrent pancreatitis, weight loss Providers:                [name], MD, Doristine Johns, RN,                            Faustina Mbumina, Technician, Cletis Athens,                            Technician Referring MD:             Silvano Rusk, MD Medicines:                Monitored Anesthesia Care Complications:            No immediate complications. Estimated blood loss:                            None. Estimated Blood Loss:     Estimated blood loss: none. Procedure:                Pre-Anesthesia Assessment:                           - Prior to the procedure, a History and Physical                            was performed, and patient medications and                            allergies were reviewed. The patient's tolerance of                            previous anesthesia was also reviewed. The risks                            and benefits of the procedure and the sedation                            options and risks were discussed with the patient.                            All questions were answered, and informed consent                            was obtained. Prior Anticoagulants: The patient has                            taken no previous anticoagulant or antiplatelet                            agents. ASA Grade Assessment:  III - A patient with                            severe systemic disease. After reviewing the risks                            and benefits, the patient was deemed in                            satisfactory condition to undergo the procedure.                           After obtaining informed consent, the endoscope was                            passed under direct vision.  Throughout the                            procedure, the patient's blood pressure, pulse, and                            oxygen saturations were monitored continuously. The                            [id number] ([id number]) Olympus was introduced through                            the mouth, and advanced to the second part of                            duodenum. The upper EUS was accomplished without                            difficulty. The patient tolerated the procedure                            well. Scope In: Scope Out: Findings:      ENDOSCOPIC FINDING: :      The examined esophagus was endoscopically normal.      The entire examined stomach was endoscopically normal.      The examined duodenum was endoscopically normal.      ENDOSONOGRAPHIC FINDING: :      1. Impressive parenchymal and ductal changes in pancreas that are       consistent with chronic pancreatitis (generally very hypoechoic and       lobular parenchyma wit thick hyperchoic strands throughout the pancreas       giving an overall honeycomb appearance; main pancreatic duct was       somewhat ectatic and had very hyperechoic walls). No discrete mass       lesions were present. This examination did not rule in or out pancreatic       divisum.      2. CBD was normal, non-dilated and without stones.      3. No peripancreatic adenopathy.      4. Limited views of the liver, spleen, gallbladder, portal and splenic  vessels were all normal. Impression:               - Impressive parenchymal and ductal changes that                            are consistent with chronic pancreatitis throughout                            the gland. Moderate Sedation:      Not Applicable - Patient had care per Anesthesia. Recommendation:           - Discharge patient to home (ambulatory).                           - Follow up with Dr. Carlean Purl in the office. Procedure Code(s):        --- Professional ---                           [phone],  Esophagogastroduodenoscopy, flexible,                            transoral; with endoscopic ultrasound examination                            limited to the esophagus, stomach or duodenum, and                            adjacent structures Diagnosis Code(s):        --- Professional ---                           K85.90, Acute pancreatitis without necrosis or                            infection, unspecified                           R93.3, Abnormal findings on diagnostic imaging of                            other parts of digestive tract CPT copyright 2019 [employer]. All rights reserved. The codes documented in this report are preliminary and upon coder review may  be revised to meet current compliance requirements. [name], MD 02/02/2020 12:28:56 PM This report has been signed electronically. Number of Addenda: 0

## 2020-02-02 NOTE — Anesthesia Postprocedure Evaluation (Signed)
Anesthesia Post Note  Patient: Ashley [name]  Procedure(s) Performed: UPPER ENDOSCOPIC ULTRASOUND (EUS) RADIAL (N/A )     Patient location during evaluation: Endoscopy Anesthesia Type: MAC Level of consciousness: awake and alert, patient cooperative and oriented Pain management: pain level controlled Vital Signs Assessment: post-procedure vital signs reviewed and stable Respiratory status: spontaneous breathing, nonlabored ventilation and respiratory function stable Cardiovascular status: blood pressure returned to baseline and stable Postop Assessment: no apparent nausea or vomiting Anesthetic complications: no   No complications documented.  Last Vitals:  Vitals:   02/02/20 1230 02/02/20 1240  BP: (!) 101/52 (!) 124/59  Pulse: 66 63  Resp: 17 15  Temp:    SpO2: 99% 100%    Last Pain:  Vitals:   02/02/20 1240  TempSrc:   PainSc: 0-No pain                 Keyston Ardolino,E. Coltyn Hanning

## 2020-02-02 NOTE — Transfer of Care (Signed)
Immediate Anesthesia Transfer of Care Note  Patient: Ashley [name]  Procedure(s) Performed: Procedure(s): UPPER ENDOSCOPIC ULTRASOUND (EUS) RADIAL (N/A)  Patient Location: PACU and Endoscopy Unit  Anesthesia Type:mac  Level of Consciousness: awake, alert  and oriented  Airway & Oxygen Therapy: Patient Spontanous Breathing and Patient connected to nasal cannula oxygen  Post-op Assessment: Report given to RN and Post -op Vital signs reviewed and stable  Post vital signs: Reviewed and stable  Last Vitals:  Vitals:   02/02/20 1100  BP: 124/75  Resp: 19  Temp: (!) 36 C  SpO2: 163%    Complications: No apparent anesthesia complications

## 2020-02-02 NOTE — Discharge Instructions (Signed)
YOU HAD AN ENDOSCOPIC PROCEDURE TODAY: Refer to the procedure report and other information in the discharge instructions given to you for any specific questions about what was found during the examination. If this information does not answer your questions, please call [place] office at [phone] to clarify.   YOU SHOULD EXPECT: Some feelings of bloating in the abdomen. Passage of more gas than usual. Walking can help get rid of the air that was put into your GI tract during the procedure and reduce the bloating. If you had a lower endoscopy (such as a colonoscopy or flexible sigmoidoscopy) you may notice spotting of blood in your stool or on the toilet paper. Some abdominal soreness may be present for a day or two, also.  DIET: Your first meal following the procedure should be a light meal and then it is ok to progress to your normal diet. A half-sandwich or bowl of soup is an example of a good first meal. Heavy or fried foods are harder to digest and may make you feel nauseous or bloated. Drink plenty of fluids but you should avoid alcoholic beverages for 24 hours. If you had a esophageal dilation, please see attached instructions for diet.    ACTIVITY: Your care partner should take you home directly after the procedure. You should plan to take it easy, moving slowly for the rest of the day. You can resume normal activity the day after the procedure however YOU SHOULD NOT DRIVE, use power tools, machinery or perform tasks that involve climbing or major physical exertion for 24 hours (because of the sedation medicines used during the test).   SYMPTOMS TO REPORT IMMEDIATELY: A gastroenterologist can be reached at any hour. Please call [phone]  for any of the following symptoms:   Following upper endoscopy (EGD, EUS, ERCP, esophageal dilation) Vomiting of blood or coffee ground material  New, significant abdominal pain  New, significant chest pain or pain under the shoulder blades  Painful or  persistently difficult swallowing  New shortness of breath  Black, tarry-looking or red, bloody stools  FOLLOW UP:  If any biopsies were taken you will be contacted by phone or by letter within the next 1-3 weeks. Call [phone]  if you have not heard about the biopsies in 3 weeks.  Please also call with any specific questions about appointments or follow up tests.  

## 2020-02-02 NOTE — Anesthesia Preprocedure Evaluation (Addendum)
Anesthesia Evaluation  Patient identified by MRN, date of birth, ID band Patient awake    Reviewed: Allergy & Precautions, NPO status , Patient's Chart, lab work & pertinent test results  History of Anesthesia Complications Negative for: history of anesthetic complications  Airway Mallampati: II  TM Distance: >3 FB Neck ROM: Full    Dental  (+) Loose, Dental Advisory Given   Pulmonary COPD, Current Smoker and Patient abstained from smoking.,  02/01/2020 SARS coronavirus NEG   breath sounds clear to auscultation       Cardiovascular hypertension, Pt. 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)     Musculoskeletal   Abdominal   Peds  Hematology   Anesthesia Other Findings   Reproductive/Obstetrics                           Anesthesia Physical Anesthesia Plan  ASA: III  Anesthesia Plan: MAC   Post-op Pain Management:    Induction:   PONV Risk Score and Plan: 1 and Treatment may vary due to age or medical condition  Airway Management Planned: Nasal Cannula and Natural Airway  Additional Equipment: None  Intra-op Plan:   Post-operative Plan:   Informed Consent: I have reviewed the patients History and Physical, chart, labs and discussed the procedure including the risks, benefits and alternatives for the proposed anesthesia with the patient or authorized representative who has indicated his/her understanding and acceptance.     Dental advisory given  Plan Discussed with: CRNA and Surgeon  Anesthesia Plan Comments:        Anesthesia Quick Evaluation

## 2020-02-02 NOTE — Interval H&P Note (Signed)
History and Physical Interval Note:  02/02/2020 10:54 AM  Ashley [name]  has presented today for surgery, with the diagnosis of recurrent pancreatitis.  The various methods of treatment have been discussed with the patient and family. After consideration of risks, benefits and other options for treatment, the patient has consented to  Procedure(s): UPPER ENDOSCOPIC ULTRASOUND (EUS) RADIAL (N/A) as a surgical intervention.  The patient's history has been reviewed, patient examined, no change in status, stable for surgery.  I have reviewed the patient's chart and labs.  Questions were answered to the patient's satisfaction.     Ashley [name]

## 2020-02-15 ENCOUNTER — Telehealth: Payer: Self-pay

## 2020-02-15 NOTE — Telephone Encounter (Signed)
-----   Message -----  From: Jerene Bears, MD  Sent: 02/14/2020 11:24 AM EDT  To: Marlon Pel, RN, [name], MD, *   Ok, thanks  [place], this lady will need an APP visit in the next month for EUS followup for chronic pancreatitis  ----- Message -----  From: [name], MD  Sent: 02/14/2020  5:35 AM EDT  To: Gatha Mayer, MD, Jerene Bears, MD   Glendell Docker,  Sorry for the confusion. Yes, this is for Despina Hidden and I actually think she is Jay's patient originally. I'll forward to him.   Ulice Dash,  A bit of confusion here. Looks like she has chronic pancreatitis. I think getting her back in the office in the next few weeks is a good idea.   Thanks   ----- Message -----  From: Gatha Mayer, MD  Sent: 02/13/2020 11:43 AM EDT  To: [name], MD   I think you are talking about Juleen China. Farnam born July 22, 1950?   I think you and I both saw her as an inpatient   Is there any follow-up that is needed, question have her see an app or something until I get back whenever that is?    Thanks  ----- Message -----  From: [name], MD  Sent: 02/02/2020 12:29 PM EDT  To: Gatha Mayer, MD   Glendell Docker,  Just completed EUS. See full report in epic.   Pretty impressive changes of chronic pancreatitis. Unclear etiology but I think her acute episodes are 'acute on chronic' actually.   Thanks   DJ    Pt scheduled to see Alonza Bogus PA 03/15/20@2pm . Appt letter mailed to pt.

## 2020-03-15 ENCOUNTER — Ambulatory Visit: Payer: Medicare Other | Admitting: Gastroenterology

## 2020-11-09 ENCOUNTER — Emergency Department (HOSPITAL_COMMUNITY): Payer: Medicare (Managed Care)

## 2020-11-09 ENCOUNTER — Encounter (HOSPITAL_COMMUNITY): Payer: Self-pay

## 2020-11-09 ENCOUNTER — Other Ambulatory Visit: Payer: Self-pay

## 2020-11-09 ENCOUNTER — Inpatient Hospital Stay (HOSPITAL_COMMUNITY)
Admission: EM | Admit: 2020-11-09 | Discharge: 2020-11-15 | DRG: 640 | Disposition: A | Discharge status: Home / Self Care | Payer: Medicare (Managed Care) | Attending: Internal Medicine | Admitting: Internal Medicine

## 2020-11-09 DIAGNOSIS — N186 End stage renal disease: Secondary | ICD-10-CM | POA: Diagnosis present

## 2020-11-09 DIAGNOSIS — D631 Anemia in chronic kidney disease: Secondary | ICD-10-CM | POA: Diagnosis present

## 2020-11-09 DIAGNOSIS — J189 Pneumonia, unspecified organism: Secondary | ICD-10-CM | POA: Diagnosis present

## 2020-11-09 DIAGNOSIS — Z79899 Other long term (current) drug therapy: Secondary | ICD-10-CM

## 2020-11-09 DIAGNOSIS — K219 Gastro-esophageal reflux disease without esophagitis: Secondary | ICD-10-CM | POA: Diagnosis present

## 2020-11-09 DIAGNOSIS — Z9115 Patient's noncompliance with renal dialysis: Secondary | ICD-10-CM

## 2020-11-09 DIAGNOSIS — K861 Other chronic pancreatitis: Secondary | ICD-10-CM | POA: Diagnosis present

## 2020-11-09 DIAGNOSIS — E8770 Fluid overload, unspecified: Secondary | ICD-10-CM

## 2020-11-09 DIAGNOSIS — I120 Hypertensive chronic kidney disease with stage 5 chronic kidney disease or end stage renal disease: Secondary | ICD-10-CM | POA: Diagnosis present

## 2020-11-09 DIAGNOSIS — J9601 Acute respiratory failure with hypoxia: Secondary | ICD-10-CM | POA: Diagnosis present

## 2020-11-09 DIAGNOSIS — R1013 Epigastric pain: Secondary | ICD-10-CM | POA: Diagnosis present

## 2020-11-09 DIAGNOSIS — N289 Disorder of kidney and ureter, unspecified: Secondary | ICD-10-CM

## 2020-11-09 DIAGNOSIS — Z8619 Personal history of other infectious and parasitic diseases: Secondary | ICD-10-CM

## 2020-11-09 DIAGNOSIS — Z8673 Personal history of transient ischemic attack (TIA), and cerebral infarction without residual deficits: Secondary | ICD-10-CM

## 2020-11-09 DIAGNOSIS — E8779 Other fluid overload: Secondary | ICD-10-CM | POA: Diagnosis not present

## 2020-11-09 DIAGNOSIS — R072 Precordial pain: Secondary | ICD-10-CM | POA: Diagnosis not present

## 2020-11-09 DIAGNOSIS — I160 Hypertensive urgency: Secondary | ICD-10-CM | POA: Diagnosis present

## 2020-11-09 DIAGNOSIS — J810 Acute pulmonary edema: Secondary | ICD-10-CM | POA: Diagnosis not present

## 2020-11-09 DIAGNOSIS — Z20822 Contact with and (suspected) exposure to covid-19: Secondary | ICD-10-CM | POA: Diagnosis present

## 2020-11-09 DIAGNOSIS — E785 Hyperlipidemia, unspecified: Secondary | ICD-10-CM | POA: Diagnosis present

## 2020-11-09 DIAGNOSIS — E8889 Other specified metabolic disorders: Secondary | ICD-10-CM | POA: Diagnosis present

## 2020-11-09 DIAGNOSIS — R0600 Dyspnea, unspecified: Secondary | ICD-10-CM

## 2020-11-09 DIAGNOSIS — Z992 Dependence on renal dialysis: Secondary | ICD-10-CM

## 2020-11-09 DIAGNOSIS — Z8249 Family history of ischemic heart disease and other diseases of the circulatory system: Secondary | ICD-10-CM

## 2020-11-09 DIAGNOSIS — Z886 Allergy status to analgesic agent status: Secondary | ICD-10-CM

## 2020-11-09 DIAGNOSIS — F17210 Nicotine dependence, cigarettes, uncomplicated: Secondary | ICD-10-CM | POA: Diagnosis present

## 2020-11-09 DIAGNOSIS — Z8711 Personal history of peptic ulcer disease: Secondary | ICD-10-CM

## 2020-11-09 DIAGNOSIS — Z8349 Family history of other endocrine, nutritional and metabolic diseases: Secondary | ICD-10-CM

## 2020-11-09 DIAGNOSIS — M1990 Unspecified osteoarthritis, unspecified site: Secondary | ICD-10-CM | POA: Diagnosis present

## 2020-11-09 DIAGNOSIS — K859 Acute pancreatitis without necrosis or infection, unspecified: Secondary | ICD-10-CM | POA: Diagnosis present

## 2020-11-09 LAB — I-STAT ARTERIAL BLOOD GAS, ED
Acid-Base Excess: 0 mmol/L (ref 0.0–2.0)
Bicarbonate: 24.5 mmol/L (ref 20.0–28.0)
Calcium, Ion: 1.18 mmol/L (ref 1.15–1.40)
HCT: 28 % — ABNORMAL LOW (ref 36.0–46.0)
Hemoglobin: 9.5 g/dL — ABNORMAL LOW (ref 12.0–15.0)
O2 Saturation: 100 %
Patient temperature: 98.2
Potassium: 4.1 mmol/L (ref 3.5–5.1)
Sodium: 143 mmol/L (ref 135–145)
TCO2: 26 mmol/L (ref 22–32)
pCO2 arterial: 39.2 mmHg (ref 32.0–48.0)
pH, Arterial: 7.403 (ref 7.350–7.450)
pO2, Arterial: 240 mmHg — ABNORMAL HIGH (ref 83.0–108.0)

## 2020-11-09 LAB — BASIC METABOLIC PANEL
Anion gap: 13 (ref 5–15)
BUN: 63 mg/dL — ABNORMAL HIGH (ref 8–23)
CO2: 23 mmol/L (ref 22–32)
Calcium: 9.1 mg/dL (ref 8.9–10.3)
Chloride: 107 mmol/L (ref 98–111)
Creatinine, Ser: 14.16 mg/dL — ABNORMAL HIGH (ref 0.44–1.00)
GFR, Estimated: 2 mL/min — ABNORMAL LOW (ref >60)
Glucose, Bld: 101 mg/dL — ABNORMAL HIGH (ref 70–99)
Potassium: 4.3 mmol/L (ref 3.5–5.1)
Sodium: 143 mmol/L (ref 135–145)

## 2020-11-09 LAB — I-STAT CHEM 8, ED
BUN: 57 mg/dL — ABNORMAL HIGH (ref 8–23)
Calcium, Ion: 1.1 mmol/L — ABNORMAL LOW (ref 1.15–1.40)
Chloride: 110 mmol/L (ref 98–111)
Creatinine, Ser: 15.5 mg/dL — ABNORMAL HIGH (ref 0.44–1.00)
Glucose, Bld: 98 mg/dL (ref 70–99)
HCT: 28 % — ABNORMAL LOW (ref 36.0–46.0)
Hemoglobin: 9.5 g/dL — ABNORMAL LOW (ref 12.0–15.0)
Potassium: 4.2 mmol/L (ref 3.5–5.1)
Sodium: 142 mmol/L (ref 135–145)
TCO2: 22 mmol/L (ref 22–32)

## 2020-11-09 LAB — HEPATIC FUNCTION PANEL
ALT: 12 U/L (ref 0–44)
AST: 14 U/L — ABNORMAL LOW (ref 15–41)
Albumin: 3.4 g/dL — ABNORMAL LOW (ref 3.5–5.0)
Alkaline Phosphatase: 55 U/L (ref 38–126)
Bilirubin, Direct: 0.1 mg/dL (ref 0.0–0.2)
Indirect Bilirubin: 0.5 mg/dL (ref 0.3–0.9)
Total Bilirubin: 0.6 mg/dL (ref 0.3–1.2)
Total Protein: 6.6 g/dL (ref 6.5–8.1)

## 2020-11-09 LAB — RESP PANEL BY RT-PCR (FLU A&B, COVID) ARPGX2
Influenza A by PCR: NEGATIVE
Influenza B by PCR: NEGATIVE
SARS Coronavirus 2 by RT PCR: NEGATIVE

## 2020-11-09 LAB — CBC
HCT: 28.8 % — ABNORMAL LOW (ref 36.0–46.0)
Hemoglobin: 8.8 g/dL — ABNORMAL LOW (ref 12.0–15.0)
MCH: 27.7 pg (ref 26.0–34.0)
MCHC: 30.6 g/dL (ref 30.0–36.0)
MCV: 90.6 fL (ref 80.0–100.0)
Platelets: 180 10*3/uL (ref 150–400)
RBC: 3.18 MIL/uL — ABNORMAL LOW (ref 3.87–5.11)
RDW: 19.9 % — ABNORMAL HIGH (ref 11.5–15.5)
WBC: 9.5 10*3/uL (ref 4.0–10.5)
nRBC: 0 % (ref 0.0–0.2)

## 2020-11-09 MED ORDER — NITROGLYCERIN IN D5W 200-5 MCG/ML-% IV SOLN
0.0000 ug/min | INTRAVENOUS | Status: DC
  Administered 2020-11-09: 5 ug/min via INTRAVENOUS
  Filled 2020-11-09: qty 250

## 2020-11-09 MED ORDER — CHLORHEXIDINE GLUCONATE CLOTH 2 % EX PADS
6.0000 | MEDICATED_PAD | Freq: Every day | CUTANEOUS | Status: DC
  Administered 2020-11-10: 6 via TOPICAL

## 2020-11-09 NOTE — ED Triage Notes (Signed)
Brought via [facility] from home. Dialysis pt M,W,F didn't go Wed, went today but no water so she hasn't had a tx since Mon. SOB x2 days.no CP but c/o headache

## 2020-11-09 NOTE — ED Notes (Signed)
resp at bedside. bipap removed at this time. Pt is placed on room air

## 2020-11-09 NOTE — ED Notes (Signed)
Provider at bedside at this time

## 2020-11-09 NOTE — Progress Notes (Signed)
RT note. Patient placed on bipap at this time, 10/5 60%

## 2020-11-09 NOTE — Progress Notes (Signed)
RT note. Patient taken off bipap at this time per abg/ md. Patient stated she does not wear oxygen at home. Patient on rm air sat 100% w/stable VS. RT will continue to monitor.
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
## 2020-11-10 ENCOUNTER — Encounter (HOSPITAL_COMMUNITY): Payer: Self-pay | Admitting: Family Medicine

## 2020-11-10 ENCOUNTER — Inpatient Hospital Stay (HOSPITAL_COMMUNITY): Payer: Medicare (Managed Care)

## 2020-11-10 DIAGNOSIS — Z992 Dependence on renal dialysis: Secondary | ICD-10-CM | POA: Diagnosis not present

## 2020-11-10 DIAGNOSIS — Z8349 Family history of other endocrine, nutritional and metabolic diseases: Secondary | ICD-10-CM | POA: Diagnosis not present

## 2020-11-10 DIAGNOSIS — J189 Pneumonia, unspecified organism: Secondary | ICD-10-CM | POA: Diagnosis present

## 2020-11-10 DIAGNOSIS — Z9115 Patient's noncompliance with renal dialysis: Secondary | ICD-10-CM | POA: Diagnosis not present

## 2020-11-10 DIAGNOSIS — Z20822 Contact with and (suspected) exposure to covid-19: Secondary | ICD-10-CM | POA: Diagnosis present

## 2020-11-10 DIAGNOSIS — I160 Hypertensive urgency: Secondary | ICD-10-CM | POA: Diagnosis present

## 2020-11-10 DIAGNOSIS — Z8673 Personal history of transient ischemic attack (TIA), and cerebral infarction without residual deficits: Secondary | ICD-10-CM | POA: Diagnosis not present

## 2020-11-10 DIAGNOSIS — R072 Precordial pain: Secondary | ICD-10-CM | POA: Diagnosis not present

## 2020-11-10 DIAGNOSIS — Z8249 Family history of ischemic heart disease and other diseases of the circulatory system: Secondary | ICD-10-CM | POA: Diagnosis not present

## 2020-11-10 DIAGNOSIS — J810 Acute pulmonary edema: Secondary | ICD-10-CM | POA: Insufficient documentation

## 2020-11-10 DIAGNOSIS — F17210 Nicotine dependence, cigarettes, uncomplicated: Secondary | ICD-10-CM | POA: Diagnosis present

## 2020-11-10 DIAGNOSIS — K861 Other chronic pancreatitis: Secondary | ICD-10-CM | POA: Diagnosis present

## 2020-11-10 DIAGNOSIS — R079 Chest pain, unspecified: Secondary | ICD-10-CM | POA: Diagnosis not present

## 2020-11-10 DIAGNOSIS — I361 Nonrheumatic tricuspid (valve) insufficiency: Secondary | ICD-10-CM | POA: Diagnosis not present

## 2020-11-10 DIAGNOSIS — N186 End stage renal disease: Secondary | ICD-10-CM | POA: Diagnosis present

## 2020-11-10 DIAGNOSIS — J9601 Acute respiratory failure with hypoxia: Secondary | ICD-10-CM | POA: Diagnosis present

## 2020-11-10 DIAGNOSIS — I340 Nonrheumatic mitral (valve) insufficiency: Secondary | ICD-10-CM

## 2020-11-10 DIAGNOSIS — E8889 Other specified metabolic disorders: Secondary | ICD-10-CM | POA: Diagnosis present

## 2020-11-10 DIAGNOSIS — K859 Acute pancreatitis without necrosis or infection, unspecified: Secondary | ICD-10-CM | POA: Diagnosis present

## 2020-11-10 DIAGNOSIS — E8779 Other fluid overload: Secondary | ICD-10-CM | POA: Diagnosis present

## 2020-11-10 DIAGNOSIS — Z79899 Other long term (current) drug therapy: Secondary | ICD-10-CM | POA: Diagnosis not present

## 2020-11-10 DIAGNOSIS — M1990 Unspecified osteoarthritis, unspecified site: Secondary | ICD-10-CM | POA: Diagnosis present

## 2020-11-10 DIAGNOSIS — I120 Hypertensive chronic kidney disease with stage 5 chronic kidney disease or end stage renal disease: Secondary | ICD-10-CM | POA: Diagnosis present

## 2020-11-10 DIAGNOSIS — D631 Anemia in chronic kidney disease: Secondary | ICD-10-CM | POA: Diagnosis present

## 2020-11-10 DIAGNOSIS — E785 Hyperlipidemia, unspecified: Secondary | ICD-10-CM | POA: Diagnosis present

## 2020-11-10 DIAGNOSIS — K219 Gastro-esophageal reflux disease without esophagitis: Secondary | ICD-10-CM | POA: Diagnosis present

## 2020-11-10 DIAGNOSIS — Z886 Allergy status to analgesic agent status: Secondary | ICD-10-CM | POA: Diagnosis not present

## 2020-11-10 LAB — BASIC METABOLIC PANEL
Anion gap: 15 (ref 5–15)
BUN: 64 mg/dL — ABNORMAL HIGH (ref 8–23)
CO2: 22 mmol/L (ref 22–32)
Calcium: 9.2 mg/dL (ref 8.9–10.3)
Chloride: 105 mmol/L (ref 98–111)
Creatinine, Ser: 14.4 mg/dL — ABNORMAL HIGH (ref 0.44–1.00)
GFR, Estimated: 2 mL/min — ABNORMAL LOW (ref >60)
Glucose, Bld: 121 mg/dL — ABNORMAL HIGH (ref 70–99)
Potassium: 4.8 mmol/L (ref 3.5–5.1)
Sodium: 142 mmol/L (ref 135–145)

## 2020-11-10 LAB — ECHOCARDIOGRAM COMPLETE
AR max vel: 1.65 cm2
AV Area VTI: 1.51 cm2
AV Area mean vel: 1.78 cm2
AV Mean grad: 6 mmHg
AV Peak grad: 12.5 mmHg
Ao pk vel: 1.77 m/s
Area-P 1/2: 4.06 cm2
Height: 62 in
MV M vel: 5.91 m/s
MV Peak grad: 139.7 mmHg
MV VTI: 0.32 cm2
Radius: 0.6 cm
S' Lateral: 3.3 cm
Weight: 1830.7 oz

## 2020-11-10 LAB — CBC
HCT: 28.2 % — ABNORMAL LOW (ref 36.0–46.0)
Hemoglobin: 8.8 g/dL — ABNORMAL LOW (ref 12.0–15.0)
MCH: 27.2 pg (ref 26.0–34.0)
MCHC: 31.2 g/dL (ref 30.0–36.0)
MCV: 87 fL (ref 80.0–100.0)
Platelets: 185 10*3/uL (ref 150–400)
RBC: 3.24 MIL/uL — ABNORMAL LOW (ref 3.87–5.11)
RDW: 19.6 % — ABNORMAL HIGH (ref 11.5–15.5)
WBC: 8.3 10*3/uL (ref 4.0–10.5)
nRBC: 0 % (ref 0.0–0.2)

## 2020-11-10 LAB — LIPASE, BLOOD: Lipase: 61 U/L — ABNORMAL HIGH (ref 11–51)

## 2020-11-10 MED ORDER — SEVELAMER CARBONATE 800 MG PO TABS
800.0000 mg | ORAL_TABLET | Freq: Three times a day (TID) | ORAL | Status: DC
  Administered 2020-11-10 – 2020-11-15 (×7): 800 mg via ORAL
  Filled 2020-11-10 (×9): qty 1

## 2020-11-10 MED ORDER — MIRTAZAPINE 15 MG PO TABS
15.0000 mg | ORAL_TABLET | Freq: Every day | ORAL | Status: DC
  Administered 2020-11-10 – 2020-11-14 (×5): 15 mg via ORAL
  Filled 2020-11-10 (×6): qty 1

## 2020-11-10 MED ORDER — HYDROMORPHONE HCL 1 MG/ML IJ SOLN
0.5000 mg | Freq: Once | INTRAMUSCULAR | Status: AC
Start: 2020-11-10 — End: 2020-11-10

## 2020-11-10 MED ORDER — SODIUM CHLORIDE 0.9 % IV SOLN
500.0000 mg | INTRAVENOUS | Status: DC
  Administered 2020-11-10: 500 mg via INTRAVENOUS
  Filled 2020-11-10 (×2): qty 500

## 2020-11-10 MED ORDER — IPRATROPIUM-ALBUTEROL 0.5-2.5 (3) MG/3ML IN SOLN: 3.0000 mL | RESPIRATORY_TRACT | Status: DC | PRN

## 2020-11-10 MED ORDER — CINACALCET HCL 30 MG PO TABS
30.0000 mg | ORAL_TABLET | Freq: Every day | ORAL | Status: DC
  Administered 2020-11-10 – 2020-11-14 (×4): 30 mg via ORAL
  Filled 2020-11-10 (×5): qty 1

## 2020-11-10 MED ORDER — HYDROMORPHONE HCL 1 MG/ML IJ SOLN
INTRAMUSCULAR | Status: AC
  Administered 2020-11-10: 0.5 mg via INTRAVENOUS
  Filled 2020-11-10: qty 0.5

## 2020-11-10 MED ORDER — RENA-VITE PO TABS
1.0000 | ORAL_TABLET | Freq: Every day | ORAL | Status: DC
  Administered 2020-11-10 – 2020-11-14 (×4): 1 via ORAL
  Filled 2020-11-10 (×5): qty 1

## 2020-11-10 MED ORDER — ONDANSETRON HCL 4 MG/2ML IJ SOLN
4.0000 mg | Freq: Four times a day (QID) | INTRAMUSCULAR | Status: DC | PRN
  Administered 2020-11-10 – 2020-11-14 (×3): 4 mg via INTRAVENOUS
  Filled 2020-11-10 (×3): qty 2

## 2020-11-10 MED ORDER — SODIUM CHLORIDE 0.9 % IV SOLN
1.0000 g | INTRAVENOUS | Status: AC
  Administered 2020-11-11 – 2020-11-14 (×5): 1 g via INTRAVENOUS
  Filled 2020-11-10 (×5): qty 10

## 2020-11-10 MED ORDER — SODIUM CHLORIDE 0.9 % IV SOLN: 250.0000 mL | INTRAVENOUS | Status: DC | PRN

## 2020-11-10 MED ORDER — ONDANSETRON HCL 4 MG PO TABS: 4.0000 mg | ORAL_TABLET | Freq: Four times a day (QID) | ORAL | Status: DC | PRN

## 2020-11-10 MED ORDER — SODIUM CHLORIDE 0.9% FLUSH
3.0000 mL | Freq: Two times a day (BID) | INTRAVENOUS | Status: DC
  Administered 2020-11-10 – 2020-11-15 (×10): 3 mL via INTRAVENOUS

## 2020-11-10 MED ORDER — HYDROMORPHONE HCL 1 MG/ML IJ SOLN
INTRAMUSCULAR | Status: AC
  Filled 2020-11-10: qty 0.5

## 2020-11-10 MED ORDER — ATORVASTATIN CALCIUM 10 MG PO TABS
20.0000 mg | ORAL_TABLET | Freq: Every day | ORAL | Status: DC
  Administered 2020-11-10 – 2020-11-14 (×5): 20 mg via ORAL
  Filled 2020-11-10 (×5): qty 2

## 2020-11-10 MED ORDER — HYDROMORPHONE HCL 1 MG/ML IJ SOLN
0.5000 mg | INTRAMUSCULAR | Status: DC | PRN
Start: 2020-11-10 — End: 2020-11-15
  Administered 2020-11-10 (×2): 0.5 mg via INTRAVENOUS
  Filled 2020-11-10: qty 1

## 2020-11-10 MED ORDER — SODIUM CHLORIDE 0.9% FLUSH: 3.0000 mL | INTRAVENOUS | Status: DC | PRN

## 2020-11-10 MED ORDER — ACETAMINOPHEN 650 MG RE SUPP: 650.0000 mg | Freq: Four times a day (QID) | RECTAL | Status: DC | PRN

## 2020-11-10 MED ORDER — HYDROMORPHONE HCL 1 MG/ML IJ SOLN
1.0000 mg | Freq: Once | INTRAMUSCULAR | Status: AC
  Administered 2020-11-10: 1 mg via INTRAVENOUS
  Filled 2020-11-10: qty 1

## 2020-11-10 MED ORDER — HYDRALAZINE HCL 25 MG PO TABS
25.0000 mg | ORAL_TABLET | Freq: Three times a day (TID) | ORAL | Status: DC
  Administered 2020-11-10 – 2020-11-11 (×4): 25 mg via ORAL
  Filled 2020-11-10 (×4): qty 1

## 2020-11-10 MED ORDER — HEPARIN SODIUM (PORCINE) 5000 UNIT/ML IJ SOLN
5000.0000 [IU] | Freq: Three times a day (TID) | INTRAMUSCULAR | Status: DC
  Administered 2020-11-10 – 2020-11-15 (×15): 5000 [IU] via SUBCUTANEOUS
  Filled 2020-11-10 (×15): qty 1

## 2020-11-10 MED ORDER — NICOTINE 7 MG/24HR TD PT24
7.0000 mg | MEDICATED_PATCH | Freq: Every day | TRANSDERMAL | Status: DC
  Administered 2020-11-12 – 2020-11-15 (×4): 7 mg via TRANSDERMAL
  Filled 2020-11-10 (×7): qty 1

## 2020-11-10 MED ORDER — CLONIDINE HCL 0.1 MG PO TABS
0.1000 mg | ORAL_TABLET | Freq: Two times a day (BID) | ORAL | Status: DC
  Administered 2020-11-10 – 2020-11-15 (×10): 0.1 mg via ORAL
  Filled 2020-11-10 (×10): qty 1

## 2020-11-10 MED ORDER — AMLODIPINE BESYLATE 10 MG PO TABS
10.0000 mg | ORAL_TABLET | Freq: Every day | ORAL | Status: DC
  Administered 2020-11-10 – 2020-11-15 (×6): 10 mg via ORAL
  Filled 2020-11-10 (×6): qty 1

## 2020-11-10 MED ORDER — ACETAMINOPHEN 325 MG PO TABS: 650.0000 mg | ORAL_TABLET | Freq: Four times a day (QID) | ORAL | Status: DC | PRN

## 2020-11-10 NOTE — ED Notes (Signed)
Provider at bedside at this time

## 2020-11-10 NOTE — Plan of Care (Signed)

## 2020-11-10 NOTE — Procedures (Signed)
   I was present at this dialysis session, have reviewed the session itself and made  appropriate changes Kelly Splinter MD [place] pager [phone]   11/10/2020, 2:48 PM

## 2020-11-10 NOTE — Progress Notes (Signed)
Pt admitted by Dr Tonie Griffith , see his note for detailed H&P.  The patient is a 70 y.o. year-old w/ hx of pancreatitis, ESRD on HD, hep C, HL, HTN, CVA, gastric ulcer/ gastropathy presented to ED  With c/o SOB lasting for 1-2 days. Also abd pain w/ some nausea. Missed last 2 HD sessions as she did not have money for the ride.. +smoker. In ED BP was 220/100, was on bipap then weaned to [place] O2. Pt currently on 2 lit of [place] oxygen. She complained of chest pain during HD, has been hypertensive all throughout the session was on nitro gtt.  She was weaned off nitro gtt and restarted her on home meds.  Pt seen and examined at bedside.  Chest pain sub sternal. Transient, resolved with NTG and dilaudid.  EKG showed non specific st t wave changes.  CXR 2 view ordered showed multi focal pneumonia. She was started on IV antibiotics.  Echocardiogram ordered for further evaluation.  Repeat EKG and troponins will be ordered.    Hosie Poisson, MD

## 2020-11-10 NOTE — Progress Notes (Signed)
   11/10/20 0920  Post-Hemodialysis Assessment  Rinseback Volume (mL) 250 mL  Dialyzer Clearance Lightly streaked  Duration of HD Treatment -hour(s) 2.5 hour(s)  Hemodialysis Intake (mL) 700 mL  UF Total -Machine (mL) 3178 mL  Net UF (mL) 2478 mL  Tolerated HD Treatment No (Comment) (partial-last hour of tx c/o chest pain, diaphoretic)  Post-Hemodialysis Comments tx complete-Dr. Jonnie Finner on unit to assess  HD tx terminated with 1 hour left. Pt initially c/o left chest pain 10/10 sharp and headache 10/10 (received dilaudid at beginning of tx for headache). Pt noted to be hypertensive prior and throughout hd tx. Grace-PA on unit and assessed patient. This nurse instructed to stop pulling fluid and monitor for improvement. Within 10 min patient began to c/o radiation to left arm. Dr. Jonnie Finner notified and on unit to assess pt. Per Dr. Jonnie Finner, stop tx, perform EKG and give another dilaudid 0.5 mg IV x1. Dr. Jonnie Finner notified attending MD-Dr. Karleen Hampshire. EKG completed-Dr. Jonnie Finner and Dr. Karleen Hampshire on unit and made aware of results. Post completion, pt verbalizing partial rellief of headache and chest pain, with new orders to be performed on unit per attending. Primary nurse Mickel Baas, made aware.

## 2020-11-10 NOTE — Progress Notes (Signed)
   11/10/20 0214  Vitals  BP (!) 191/100  MAP (mmHg) 128  Pulse Rate 82  ECG Heart Rate 77  Oxygen Therapy  SpO2 99 %  MEWS Score  MEWS Temp 0  MEWS Systolic 0  MEWS Pulse 0  MEWS RR 0  MEWS LOC 0  MEWS Score 0  MEWS Score Color Green  Provider Notification  Provider Name/Title G. Shalhoub  Date Provider Notified 11/10/20  Time Provider Notified 0214  Notification Type Page  Notification Reason Change in status  Provider response No new orders  Date of Provider Response 11/10/20  Time of Provider Response [phone]

## 2020-11-10 NOTE — Progress Notes (Signed)
*  PRELIMINARY RESULTS* Echocardiogram 2D Echocardiogram has been performed.  Ashley [name] 11/10/2020, 2:50 PM

## 2020-11-10 NOTE — Consult Note (Signed)
Renal Service Consult Note [facility]  Ashley [name] 11/10/2020 Sol Blazing, MD Requesting Physician: Dr Karleen Hampshire  Reason for Consult: ESRD pt w/ HTN'sive urgency HPI: The patient is a 70 y.o. year-old w/ hx of pancreatitis, ESRD on HD, hep C, HL, HTN, CVA, gastric ulcer/ gastropathy presented to ED early today w/ c/o SOB lasting for 1-2 days. Also abd pain w/ some nausea. Missed last 2 HD sessions. +smoker. In ED BP was 220/100, was on bipap then weaned to [place] O2. Creat 14, covid neg, CXR showed bibasilar infiltrates. Pt was admitted and started on IV NTG. We are asked to see for dialysis.   Pt seen on HD this am.  She had a good HD session and breathing improved significantly.  Near the end around 45 min left she developed chest pain, then L arm pain so dialysis was aborted. Her chest pain resolved. She had a bad headache so the pcp dc'd her IV ntg drip and this helped.  The pt denies any cough , SOB at this time.     ROS  denies CP  no joint pain   no HA  no blurry vision  no rash  no diarrhea  no nausea/ vomiting   Past Medical History  Past Medical History:  Diagnosis Date  . Acute pancreatitis 2000   2000, 12/2018, 08/2019  . Arthritis   . Cervical radiculopathy 02/28/2011  . Cocaine substance abuse ([place]) 05/26/2013   positive UDS   . Duodenitis   . Erosive gastropathy   . ESRD on hemodialysis (HCC)    TTS  . GERD (gastroesophageal reflux disease)   . 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  genotype 2B.  Epclusa started early 04/2020.    Marland Kitchen Hiatal hernia   . Hyperlipidemia 2015  . Hypertension 2008  . Marijuana abuse 05/27/2003   positive UDS, family members smoke as well  . Pancreatitis   . 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 Schatzki's ring   . Stroke ([place]) 06/2017   MRI:MRI: small, subacute left internal capsule infarct.  Chronic microvascular ischemic changes w parenchymal volume loss. Chronic white matter periventricular microhemorrhage,  likely due to htn  . Ulcer 1990   gastric ulcer. Ruptured s/p emergency repair   Past Surgical History  Past Surgical History:  Procedure Laterality Date  . ABDOMINAL HYSTERECTOMY  1979  . AV FISTULA PLACEMENT Left 06/16/2017   Procedure: ARTERIOVENOUS (AV) FISTULA CREATION LEFT ARM;  Surgeon: Conrad Galloway, MD;  Location: [place];  Service: Vascular;  Laterality: Left;  . BASCILIC VEIN TRANSPOSITION Left 10/02/2017   Procedure: BASILIC VEIN TRANSPOSITION SECOND STAGE LEFT ARM;  Surgeon: Rosetta Posner, MD;  Location: [place];  Service: Vascular;  Laterality: Left;  . BIOPSY  09/06/2019   Procedure: BIOPSY;  Surgeon: Gatha Mayer, MD;  Location: [facility] ENDOSCOPY;  Service: Endoscopy;;  . ESOPHAGOGASTRODUODENOSCOPY N/A 05/29/2013   Procedure: ESOPHAGOGASTRODUODENOSCOPY (EGD);  Surgeon: Jerene Bears, MD;  Location: [facility] ENDOSCOPY;  Service: Endoscopy;  Laterality: N/A;  . ESOPHAGOGASTRODUODENOSCOPY  05/2013   for epigastric pain.  Nonobstructing Schatzki ring at GEJ, mild gastropathy, nonbleeding AVMs in bulb and D2. 5 mm sessile polyp in bulb.  . ESOPHAGOGASTRODUODENOSCOPY (EGD) WITH PROPOFOL N/A 09/06/2019   Procedure: ESOPHAGOGASTRODUODENOSCOPY (EGD) WITH PROPOFOL;  Surgeon: Gatha Mayer, MD;  Location: [place];  Service: Endoscopy;  Laterality: N/A;  . ESOPHAGOGASTRODUODENOSCOPY (EGD) WITH PROPOFOL N/A 02/02/2020   Procedure: ESOPHAGOGASTRODUODENOSCOPY (EGD) WITH PROPOFOL;  Surgeon: Ardis Hughs,  Melene Plan, MD;  Location: [facility] ENDOSCOPY;  Service: Endoscopy;  Laterality: N/A;  . EUS N/A 02/02/2020   Procedure: UPPER ENDOSCOPIC ULTRASOUND (EUS) RADIAL;  Surgeon: [name], MD;  Location: [facility] ENDOSCOPY;  Service: Endoscopy;  Laterality: N/A;  . EXCHANGE OF A DIALYSIS CATHETER Left 07/31/2017   Procedure: Removal  OF A  Right GroinTUNNELED  DIALYSIS CATHETER ,  Insertion of Left Femoral Dialysis Catheter.;  Surgeon: Rosetta Posner, MD;  Location: [place];  Service: Vascular;  Laterality: Left;  . HOT  HEMOSTASIS N/A 09/06/2019   Procedure: HOT HEMOSTASIS (ARGON PLASMA COAGULATION/BICAP);  Surgeon: Gatha Mayer, MD;  Location: [facility] ENDOSCOPY;  Service: Endoscopy;  Laterality: N/A;  . INSERTION OF DIALYSIS CATHETER Right 06/16/2017   Procedure: INSERTION OF DIALYSIS CATHETER;  Surgeon: Conrad Laird, MD;  Location: [facility];  Service: Vascular;  Laterality: Right;  . IR AV DIALY SHUNT INTRO [place] W/PTA/IMG LEFT  06/21/2018  . REPAIR OF PERFORATED ULCER  1990   gastric ulcer   Family History  Family History  Problem Relation Age of Onset  . Hypertension Father   . Cancer Father   . Hyperlipidemia Father   . Seizures Sister   . Early death Daughter   . Kidney disease Daughter        end stage dialysis dependent    Social History  reports that she has been smoking cigarettes. She has a 10.00 pack-year smoking history. She has never used smokeless tobacco. She reports previous drug use. Drugs: Heroin, Marijuana, and Cocaine. She reports that she does not drink alcohol. Allergies  Allergies  Allergen Reactions  . Aspirin Nausea And Vomiting    Stomach ache  . Ibuprofen Nausea And Vomiting    Stomach ache   Home medications Prior to Admission medications   Medication Sig Start Date End Date Taking? Authorizing Provider  acetaminophen (TYLENOL) 325 MG tablet Take 2 tablets (650 mg total) by mouth every 6 (six) hours as needed for mild pain (or Fever >/= 101). 01/11/20  Yes Little Ishikawa, MD  amLODipine (NORVASC) 10 MG tablet Take 1 tablet (10 mg total) by mouth daily. Patient taking differently: Take 10 mg by mouth daily at 12 noon. 11/11/16  Yes Luiz Blare Y, DO  atorvastatin (LIPITOR) 20 MG tablet Take 20 mg by mouth at bedtime. 08/05/20  Yes [provider]  B Complex-C-Zn-Folic Acid (DIALYVITE Q000111Q WITH ZINC) 0.8 MG TABS Take 1 tablet by mouth daily.  07/07/19  Yes [provider]  cinacalcet (SENSIPAR) 30 MG tablet Take 30 mg by mouth at bedtime.     Yes [provider]  cloNIDine (CATAPRES) 0.1 MG tablet Take 0.1 mg by mouth 2 (two) times daily. 06/25/19  Yes [provider]  ferric citrate (AURYXIA) 1 GM 210 MG(Fe) tablet Take 1 tablet (210 mg total) by mouth 3 (three) times daily with meals. Patient taking differently: Take 420 mg by mouth 3 (three) times daily with meals. 01/11/20  Yes Little Ishikawa, MD  lidocaine-prilocaine (EMLA) cream Apply 1 application topically See admin instructions. Apply small amount to access site on Tuesday, Thursday, Saturday one hour before dialysis. Cover with occlusive dressing (saran wrap) 08/25/19  Yes [provider]  Methoxy PEG-Epoetin Beta (MIRCERA IJ) Administered at dialysis 09/24/20 09/23/21 Yes [provider]  mirtazapine (REMERON) 15 MG tablet Take 15 mg by mouth at bedtime. 11/02/20  Yes [provider]  polyethylene glycol (MIRALAX) 17 g packet Take 17 g by  mouth 2 (two) times daily. Patient taking differently: Take 17 g by mouth daily as needed for moderate constipation. 09/14/19  Yes Khatri, Hina, PA-C  sevelamer carbonate (RENVELA) 800 MG tablet Take 800 mg by mouth 3 (three) times daily. 07/23/20  Yes [provider]  Nutritional Supplements (FEEDING SUPPLEMENT, NEPRO CARB STEADY,) LIQD Take 237 mLs by mouth 3 (three) times daily as needed (Supplement). Patient not taking: Reported on 11/10/2020 07/11/19   Radene Gunning, NP  pantoprazole (PROTONIX) 40 MG tablet Take 1 tablet (40 mg total) by mouth 2 (two) times daily. Patient not taking: Reported on 11/10/2020 01/11/20   Little Ishikawa, MD     Vitals:   11/10/20 0730 11/10/20 0800 11/10/20 0830 11/10/20 0927  BP: (!) 155/98 (!) 178/98 (!) 179/85 (!) 178/98  Pulse: 89 91 94 92  Resp: (!) '25 13 13 20  '$ Temp:    98.2 F (36.8 C)  TempSrc:    Oral  SpO2: 100% 100% 99% 99%  Weight:      Height:       Exam Gen alert, no distress No rash, cyanosis or gangrene Sclera anicteric, throat  clear  No jvd or bruits Chest clear bilat to bases, no rales/ wheezing RRR no MRG Abd soft ntnd no mass or ascites +bs GU deferred  MS no joint effusions or deformity Ext no LE or UE edema, no wounds or ulcers Neuro is alert, Ox 3 , nf  AVF + bruit       OP HD: [place] MWF   3.5h  350/500   57kg  2/2 bath  P2  Hep 2000+ [id number]  - hect 4 ug tiw  - venofer 100 x5 (2 left)  - mircera 60 q2 last 5/25  - home renal: auryxia 2 tid/ norvasc 10/ sensipar 30 hs/ clonidine 0.1 mg bid/ renvela 800 tid    Assessment/ Plan: 1. HTN crisis - BP's improving w/ dialysis. Cont home meds and additional as needed, lowering volume w HD.  2. SOB/ pulm edema - by CXR. HD today w/ 2.5 L off, clinically improved, on nasal O2. Repeat CXR.   3. ESRD - on HD MWF. Missed last two HD sessions, transportation issue 1 one of those times. HD today. Next HD Monday if still here. 4. Chest pain - on HD today, EKG neg, resolved w/ rinseback.  5. MBC ckd - cont binder, sensipar 6. Anemia ckd - hb 8-10 here, next esa due 6/08      RKelly Splinter MD 11/10/2020, 9:45 AM  Recent Labs  Lab 11/09/20 2146 11/09/20 2214 11/09/20 2240 11/10/20 0229  WBC 9.5  --   --  8.3  HGB 8.8*   < > 9.5* 8.8*   < > = values in this interval not displayed.   Recent Labs  Lab 11/09/20 2146 11/09/20 2214 11/09/20 2240 11/10/20 0229  K 4.3   < > 4.2 4.8  BUN 63*  --  57* 64*  CREATININE 14.16*  --  15.50* 14.40*  CALCIUM 9.1  --   --  9.2   < > = values in this interval not displayed.

## 2020-11-10 NOTE — H&P (Signed)
History and Physical    [name]:[id number] DOB: Jun 04, 1951 DOA: 11/09/2020  PCP: Raymon Mutton., FNP   Patient coming from: Home  Chief Complaint: SOB  HPI: Ashley [name] is a 70 y.o. female with medical history significant for ESRD on HD, HTN, chronic pancreatitis, HLD, hx of CVA in 2019 who presents for evaluation of SOB.  She reports that on the evening of November 08, 2020 she started to have shortness of breath that developed she is could not get a deep breath and over the next day symptoms are worsened.  She denies having any fever, cough, chills, chest pain or diaphoresis.  She denies headache or vomiting.  She does have abdominal pain that she states feels like her chronic pancreatitis associated with some nausea.  She denies any diarrhea.  Her last dialysis session was on Monday, Nov 05, 2020 (5 days ago).  States she did not have the money to go to dialysis on Wednesday and when she went Friday dialysis center was shut down due to not having water and she was sent back home.  States shortness of breath worsens when she tries to walk or exert herself.  She has not had any exposures to animals, fumes, new chemicals, pollens.  No known sick contacts and no known exposures to COVID-19. Continues to smoke 4 to 5 cigarettes a day and states she is trying to quit.   ED Course: Patient presented with significant hypoxia and respiratory distress and was placed on BiPAP.  About has now been weaned and she is on room air oxygenating well. BP has been 160-220/81-102 in ER.  Started on nitroglycerin infusion for her significant blood pressure elevation.  ABG showed a pH of 7.403, PCO2 of 39.2, PO2 of 240 with a bicarb of 24.5 on BiPAP.  BBC 9500, hemoglobin 8.8, hematocrit 28.8, platelets 180,000.  Sodium 143, potassium 4.3, chloride 107, bicarb 23, glucose 101, BUN 63, creatinine 14.16, LFTs normal.  COVID-19 swab is negative.  Influenza A and B are negative. Physician discussed with nephrology  who will see patient in the morning and dialyze patient in the morning. Hospitalist service has been asked to admit for further management  Review of Systems:  General: Denies fever, chills, weight loss, night sweats.  Denies dizziness HENT: Denies head trauma, headache, denies change in hearing, tinnitus.  Denies nasal bleeding.  Denies sore throat, sores in mouth.  Denies difficulty swallowing Eyes: Denies blurry vision, pain in eye, drainage.  Denies discoloration of eyes. Neck: Denies pain.  Denies swelling.  Denies pain with movement. Cardiovascular: Denies chest pain, palpitations.  Has chronic edema.  Denies orthopnea Respiratory: Reports shortness of breath.  Denies wheezing.  Denies sputum production Gastrointestinal: Reports abdominal pain with nausea. Denies vomiting, diarrhea.  Denies melena.  Denies hematemesis. Musculoskeletal: Denies limitation of movement.  Denies deformity or swelling.  Denies pain.  Denies arthralgias or myalgias. Genitourinary: Denies pelvic pain.  Denies urinary frequency or hesitancy.  Denies dysuria.  Skin: Denies rash.  Denies petechiae, purpura, ecchymosis. Neurological: Denies headache. Denies syncope. Denies seizure activity. Denies paresthesia. Denies slurred speech, drooping face.  Denies visual change. Psychiatric: Denies depression, anxiety. Denies hallucinations.  Past Medical History:  Diagnosis Date  . Acute pancreatitis 2000   2000, 12/2018, 08/2019  . Arthritis   . Cervical radiculopathy 02/28/2011  . Cocaine substance abuse (HCC) 05/26/2013   positive UDS   . Duodenitis   . Erosive gastropathy   . ESRD on hemodialysis (HCC)  TTS  . GERD (gastroesophageal reflux disease)   . 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  genotype 2B.  Epclusa started early 04/2020.    Marland Kitchen Hiatal hernia   . Hyperlipidemia 2015  . Hypertension 2008  . Marijuana abuse 05/27/2003   positive UDS, family members smoke as well  . Pancreatitis   . Progressive focal  motor weakness 06/14/2017  . Schatzki's ring   . Stroke (HCC) 06/2017   MRI:MRI: small, subacute left internal capsule infarct.  Chronic microvascular ischemic changes w parenchymal volume loss. Chronic white matter periventricular microhemorrhage, likely due to htn  . Ulcer 1990   gastric ulcer. Ruptured s/p emergency repair    Past Surgical History:  Procedure Laterality Date  . ABDOMINAL HYSTERECTOMY  1979  . AV FISTULA PLACEMENT Left 06/16/2017   Procedure: ARTERIOVENOUS (AV) FISTULA CREATION LEFT ARM;  Surgeon: Fransisco Hertz, MD;  Location: [facility] OR;  Service: Vascular;  Laterality: Left;  . BASCILIC VEIN TRANSPOSITION Left 10/02/2017   Procedure: BASILIC VEIN TRANSPOSITION SECOND STAGE LEFT ARM;  Surgeon: Larina Earthly, MD;  Location: [facility] OR;  Service: Vascular;  Laterality: Left;  . BIOPSY  09/06/2019   Procedure: BIOPSY;  Surgeon: Iva Boop, MD;  Location: [facility] ENDOSCOPY;  Service: Endoscopy;;  . ESOPHAGOGASTRODUODENOSCOPY N/A 05/29/2013   Procedure: ESOPHAGOGASTRODUODENOSCOPY (EGD);  Surgeon: Beverley Fiedler, MD;  Location: [facility] ENDOSCOPY;  Service: Endoscopy;  Laterality: N/A;  . ESOPHAGOGASTRODUODENOSCOPY  05/2013   for epigastric pain.  Nonobstructing Schatzki ring at GEJ, mild gastropathy, nonbleeding AVMs in bulb and D2. 5 mm sessile polyp in bulb.  . ESOPHAGOGASTRODUODENOSCOPY (EGD) WITH PROPOFOL N/A 09/06/2019   Procedure: ESOPHAGOGASTRODUODENOSCOPY (EGD) WITH PROPOFOL;  Surgeon: Iva Boop, MD;  Location: [facility] ENDOSCOPY;  Service: Endoscopy;  Laterality: N/A;  . ESOPHAGOGASTRODUODENOSCOPY (EGD) WITH PROPOFOL N/A 02/02/2020   Procedure: ESOPHAGOGASTRODUODENOSCOPY (EGD) WITH PROPOFOL;  Surgeon: Rachael Fee, MD;  Location: [facility] ENDOSCOPY;  Service: Endoscopy;  Laterality: N/A;  . EUS N/A 02/02/2020   Procedure: UPPER ENDOSCOPIC ULTRASOUND (EUS) RADIAL;  Surgeon: Rachael Fee, MD;  Location: [facility] ENDOSCOPY;  Service: Endoscopy;  Laterality: N/A;  . EXCHANGE OF A DIALYSIS CATHETER  Left 07/31/2017   Procedure: Removal  OF A  Right GroinTUNNELED  DIALYSIS CATHETER ,  Insertion of Left Femoral Dialysis Catheter.;  Surgeon: Larina Earthly, MD;  Location: [facility] OR;  Service: Vascular;  Laterality: Left;  . HOT HEMOSTASIS N/A 09/06/2019   Procedure: HOT HEMOSTASIS (ARGON PLASMA COAGULATION/BICAP);  Surgeon: Iva Boop, MD;  Location: [facility] ENDOSCOPY;  Service: Endoscopy;  Laterality: N/A;  . INSERTION OF DIALYSIS CATHETER Right 06/16/2017   Procedure: INSERTION OF DIALYSIS CATHETER;  Surgeon: Fransisco Hertz, MD;  Location: [facility] OR;  Service: Vascular;  Laterality: Right;  . IR AV DIALY SHUNT INTRO NEEDLE/INTRACATH INITIAL W/PTA/IMG LEFT  06/21/2018  . REPAIR OF PERFORATED ULCER  1990   gastric ulcer    Social History  reports that she has been smoking cigarettes. She has a 10.00 pack-year smoking history. She has never used smokeless tobacco. She reports previous drug use. Drugs: Heroin, Marijuana, and Cocaine. She reports that she does not drink alcohol.  Allergies  Allergen Reactions  . Aspirin Nausea And Vomiting    Stomach ache  . Ibuprofen Nausea And Vomiting    Stomach ache    Family History  Problem Relation Age of Onset  . Hypertension Father   . Cancer Father   . Hyperlipidemia Father   . Seizures Sister   .  Early death Daughter   . Kidney disease Daughter        end stage dialysis dependent      Prior to Admission medications   Medication Sig Start Date End Date Taking? Authorizing Provider  acetaminophen (TYLENOL) 325 MG tablet Take 2 tablets (650 mg total) by mouth every 6 (six) hours as needed for mild pain (or Fever >/= 101). 01/11/20  Yes Azucena Fallen, MD  amLODipine (NORVASC) 10 MG tablet Take 1 tablet (10 mg total) by mouth daily. Patient taking differently: Take 10 mg by mouth daily at 12 noon. 11/11/16  Yes Caryl Ada Y, DO  atorvastatin (LIPITOR) 20 MG tablet Take 20 mg by mouth at bedtime. 08/05/20  Yes [provider]  B  Complex-C-Zn-Folic Acid (DIALYVITE 800 WITH ZINC) 0.8 MG TABS Take 1 tablet by mouth daily.  07/07/19  Yes [provider]  cinacalcet (SENSIPAR) 30 MG tablet Take 30 mg by mouth at bedtime.    Yes [provider]  cloNIDine (CATAPRES) 0.1 MG tablet Take 0.1 mg by mouth 2 (two) times daily. 06/25/19  Yes [provider]  ferric citrate (AURYXIA) 1 GM 210 MG(Fe) tablet Take 1 tablet (210 mg total) by mouth 3 (three) times daily with meals. Patient taking differently: Take 420 mg by mouth 3 (three) times daily with meals. 01/11/20  Yes Azucena Fallen, MD  lidocaine-prilocaine (EMLA) cream Apply 1 application topically See admin instructions. Apply small amount to access site on Tuesday, Thursday, Saturday one hour before dialysis. Cover with occlusive dressing (saran wrap) 08/25/19  Yes [provider]  Methoxy PEG-Epoetin Beta (MIRCERA IJ) Administered at dialysis 09/24/20 09/23/21 Yes [provider]  mirtazapine (REMERON) 15 MG tablet Take 15 mg by mouth at bedtime. 11/02/20  Yes [provider]  polyethylene glycol (MIRALAX) 17 g packet Take 17 g by mouth 2 (two) times daily. Patient taking differently: Take 17 g by mouth daily as needed for moderate constipation. 09/14/19  Yes Khatri, Hina, PA-C  sevelamer carbonate (RENVELA) 800 MG tablet Take 800 mg by mouth 3 (three) times daily. 07/23/20  Yes [provider]  Nutritional Supplements (FEEDING SUPPLEMENT, NEPRO CARB STEADY,) LIQD Take 237 mLs by mouth 3 (three) times daily as needed (Supplement). Patient not taking: Reported on 11/10/2020 07/11/19   Gwenyth Bender, NP  pantoprazole (PROTONIX) 40 MG tablet Take 1 tablet (40 mg total) by mouth 2 (two) times daily. Patient not taking: Reported on 11/10/2020 01/11/20   Azucena Fallen, MD    Physical Exam: Vitals:   11/09/20 2305 11/09/20 2315 11/09/20 2330 11/09/20 2345  BP: (!) 175/80 (!) 182/81 (!) 178/78 (!) 177/81  Pulse: 78 77 78 77   Resp: (!) 22 20 (!) 21 19  Temp:      TempSrc:      SpO2: 92% 92% 92% 90%  Weight:      Height:        Constitutional: NAD, calm, appears uncomfortable Vitals:   11/09/20 2305 11/09/20 2315 11/09/20 2330 11/09/20 2345  BP: (!) 175/80 (!) 182/81 (!) 178/78 (!) 177/81  Pulse: 78 77 78 77  Resp: (!) 22 20 (!) 21 19  Temp:      TempSrc:      SpO2: 92% 92% 92% 90%  Weight:      Height:       General: WDWN, Alert and oriented x3.  Eyes: EOMI, PERRL, conjunctivae normal.  Sclera nonicteric HENT:  [place]/AT, external ears normal.  Nares  patent without epistasis.  Mucous membranes are moist. Posterior pharynx clear of any exudate or lesions. Neck: Soft, normal range of motion, supple, no masses, no thyromegaly. Trachea midline Respiratory: Diminished breath sounds with diffuse rales.  no wheezing, no crackles. Normal respiratory effort. No accessory muscle use.  Cardiovascular: Regular rate and rhythm, no murmurs / rubs / gallops. Mild lower extremity edema. 1+ pedal pulses Abdomen: Soft, epigastric and upper abdominal tenderness, nondistended, no rebound or guarding.  No masses palpated. Bowel sounds normoactive Musculoskeletal: FROM. no cyanosis. No joint deformity upper and lower extremities. Normal muscle tone.  Skin: Warm, dry, intact no rashes, lesions, ulcers. No induration Neurologic: CN 2-12 grossly intact.  Normal speech.  Sensation intact, Strength 5/5 in all extremities.   Psychiatric: Normal judgment and insight.  Normal mood.    Labs on Admission: I have personally reviewed following labs and imaging studies  CBC: Recent Labs  Lab 11/09/20 2146 11/09/20 2214 11/09/20 2240  WBC 9.5  --   --   HGB 8.8* 9.5* 9.5*  HCT 28.8* 28.0* 28.0*  MCV 90.6  --   --   PLT 180  --   --     Basic Metabolic Panel: Recent Labs  Lab 11/09/20 2146 11/09/20 2214 11/09/20 2240  NA 143 143 142  K 4.3 4.1 4.2  CL 107  --  110  CO2 23  --   --   GLUCOSE 101*  --  98  BUN 63*  --   57*  CREATININE 14.16*  --  15.50*  CALCIUM 9.1  --   --     GFR: Estimated Creatinine Clearance: 2.6 mL/min (A) (by C-G formula based on SCr of 15.5 mg/dL (H)).  Liver Function Tests: Recent Labs  Lab 11/09/20 2146  AST 14*  ALT 12  ALKPHOS 55  BILITOT 0.6  PROT 6.6  ALBUMIN 3.4*    Urine analysis:    Component Value Date/Time   COLORURINE YELLOW 12/31/2018 1014   APPEARANCEUR CLEAR 12/31/2018 1014   LABSPEC 1.008 12/31/2018 1014   PHURINE 9.0 (H) 12/31/2018 1014   GLUCOSEU NEGATIVE 12/31/2018 1014   HGBUR SMALL (A) 12/31/2018 1014   BILIRUBINUR NEGATIVE 12/31/2018 1014   BILIRUBINUR NEG 01/29/2016 1142   KETONESUR NEGATIVE 12/31/2018 1014   PROTEINUR >=300 (A) 12/31/2018 1014   UROBILINOGEN 0.2 01/29/2016 1142   UROBILINOGEN 0.2 03/21/2015 1649   NITRITE NEGATIVE 12/31/2018 1014   LEUKOCYTESUR NEGATIVE 12/31/2018 1014    Radiological Exams on Admission: DG Chest Portable 1 View  Result Date: 11/09/2020 CLINICAL DATA:  Dyspnea EXAM: PORTABLE CHEST 1 VIEW COMPARISON:  01/07/2020 FINDINGS: Patchy multifocal pulmonary infiltrates are noted within the right mid and lower lung zone and left lung base, possibly infectious or inflammatory in the acute setting. No pneumothorax or pleural effusion. Cardiac size is within normal limits. Pulmonary vascularity is normal. No acute bone abnormality. IMPRESSION: Multifocal pulmonary infiltrates, possibly infectious or inflammatory in the acute setting. Electronically Signed   By: Helyn Numbers MD   On: 11/09/2020 22:13    EKG: Independently reviewed.  EKG shows sinus arrhythmia with nonspecific lateral T wave changes.  No acute ST elevation or depression.  QTc 458  Assessment/Plan Principal Problem:   Hypertensive urgency Ms. Mccallister is admitted to Progressive care unit. She presented with significantly elevated BP and was started on aggressive infusion in the emergency room.  She reports that she did not take her Norvasc yesterday  morning but she did take her clonidine twice yesterday  for her blood pressure.  Will wean nitroglycerin infusion as tolerated. Monitor blood pressure closely Pt denies headache, chest pain or palpitations.   Active Problems:   Acute respiratory failure with hypoxia  Initially required BiPAP secondary to pulmonary edema and respiratory failure with hypoxia.  BiPAP was weaned in the emergency room patient and is now saturating well on room air. ABG done in ER was reassuring.  Will obtain PA and Lat CXR in am.     ESRD (end stage renal disease) on dialysis  Chronic. Missed HD sessions during the week. Nephrology will dialysis her in the am.     Chronic pancreatitis  She reports severe abdominal pain and feels like her chronic pancreatitis pain.  Check Lipase level.  Given IV dilaudid for pain control.     Epigastric pain Secondary to pancreatitis    DVT prophylaxis: Heparin for DVT prophylaxis.  Code Status:   Full code  Family Communication:  And plan discussed with patient.  Patient verbalized understanding and agrees with plan.  Further recommendations to follow as clinically indicated Disposition Plan:   Patient is from:  Home  Anticipated DC to:  Home  Anticipated DC date:  Anticipate 2 midnight or longer stay in the hospital  Anticipated DC barriers: No barriers to discharge identified at this time  Consults called:  Nephrology, to have HD in am Admission status:  Inpatient   Claudean Severance Aili Casillas MD [facility]  How to contact the [facility] Attending or Consulting provider 7A - 7P or covering provider during after hours 7P -7A, for this patient?   1. Check the care team in [facility] and look for a) attending/consulting TR[facility] provider listed and b) the [facility] team listed 2. Log into [email or web address] and use [place]'s universal password to access. If you do not have the password, please contact the hospital operator. 3. Locate the [facility] provider you are looking for under [facility] and page to a number that you can be directly reached. 4. If you still have difficulty reaching the provider, please page the [facility] (Director on Call) for the Hospitalists listed on amion for assistance.  11/10/2020, 12:58 AM

## 2020-11-10 NOTE — ED Notes (Signed)
Oxygen saturation 85% on room air. Placed on 2L nasal cannula - now 98%

## 2020-11-10 NOTE — Progress Notes (Signed)
Attempted to call daughter, Sharlotte Alamo to update her. Unable to get through or leave message on phone documented in chart. Will attempt tomorrow.

## 2020-11-11 DIAGNOSIS — Z992 Dependence on renal dialysis: Secondary | ICD-10-CM

## 2020-11-11 DIAGNOSIS — N186 End stage renal disease: Secondary | ICD-10-CM

## 2020-11-11 DIAGNOSIS — J9601 Acute respiratory failure with hypoxia: Secondary | ICD-10-CM

## 2020-11-11 DIAGNOSIS — K861 Other chronic pancreatitis: Secondary | ICD-10-CM

## 2020-11-11 LAB — HEPATIC FUNCTION PANEL
ALT: 11 U/L (ref 0–44)
AST: 15 U/L (ref 15–41)
Albumin: 2.9 g/dL — ABNORMAL LOW (ref 3.5–5.0)
Alkaline Phosphatase: 48 U/L (ref 38–126)
Bilirubin, Direct: <0.1 mg/dL (ref 0.0–0.2)
Total Bilirubin: 0.5 mg/dL (ref 0.3–1.2)
Total Protein: 6.4 g/dL — ABNORMAL LOW (ref 6.5–8.1)

## 2020-11-11 LAB — TROPONIN I (HIGH SENSITIVITY)
Troponin I (High Sensitivity): 12 ng/L (ref <18)
Troponin I (High Sensitivity): 10 ng/L (ref <18)

## 2020-11-11 LAB — LIPASE, BLOOD: Lipase: 53 U/L — ABNORMAL HIGH (ref 11–51)

## 2020-11-11 MED ORDER — AZITHROMYCIN 250 MG PO TABS
500.0000 mg | ORAL_TABLET | Freq: Every day | ORAL | Status: AC
  Administered 2020-11-11 – 2020-11-14 (×4): 500 mg via ORAL
  Filled 2020-11-11 (×4): qty 2

## 2020-11-11 MED ORDER — OXYCODONE-ACETAMINOPHEN 5-325 MG PO TABS
1.0000 | ORAL_TABLET | ORAL | Status: DC | PRN
  Administered 2020-11-11: 1 via ORAL
  Administered 2020-11-11 – 2020-11-14 (×10): 2 via ORAL
  Filled 2020-11-11 (×2): qty 2
  Filled 2020-11-11: qty 1
  Filled 2020-11-11: qty 2
  Filled 2020-11-11: qty 1
  Filled 2020-11-11 (×7): qty 2

## 2020-11-11 MED ORDER — NEPRO/CARBSTEADY PO LIQD
237.0000 mL | Freq: Two times a day (BID) | ORAL | Status: DC
  Administered 2020-11-11 – 2020-11-15 (×7): 237 mL via ORAL

## 2020-11-11 MED ORDER — PANTOPRAZOLE SODIUM 40 MG IV SOLR
40.0000 mg | INTRAVENOUS | Status: DC
  Filled 2020-11-11: qty 40

## 2020-11-11 MED ORDER — PANTOPRAZOLE SODIUM 40 MG IV SOLR
40.0000 mg | Freq: Every day | INTRAVENOUS | Status: DC
  Administered 2020-11-11: 40 mg via INTRAVENOUS

## 2020-11-11 MED ORDER — PANTOPRAZOLE SODIUM 40 MG PO TBEC: 40.0000 mg | DELAYED_RELEASE_TABLET | Freq: Every day | ORAL | Status: DC

## 2020-11-11 MED ORDER — HYDRALAZINE HCL 50 MG PO TABS
50.0000 mg | ORAL_TABLET | Freq: Three times a day (TID) | ORAL | Status: DC
  Administered 2020-11-11 – 2020-11-14 (×7): 50 mg via ORAL
  Filled 2020-11-11 (×8): qty 1

## 2020-11-11 MED ORDER — CALCIUM CARBONATE ANTACID 500 MG PO CHEW: 1.0000 | CHEWABLE_TABLET | Freq: Two times a day (BID) | ORAL | Status: DC | PRN

## 2020-11-11 NOTE — Progress Notes (Deleted)
[facility] Progress Note  Subjective: no c/o today, 2.5 L off w/ HD yesterday. 2 L [place].   Vitals:   11/10/20 1500 11/10/20 2012 11/11/20 0033 11/11/20 0500  BP: (!) 169/70 (!) 153/101 (!) 152/86 (!) 180/88  Pulse: 92 80 69 79  Resp: '18 18 18 17  '$ Temp: 98.2 F (36.8 C) 99.1 F (37.3 C) 98.9 F (37.2 C) 98.1 F (36.7 C)  TempSrc: Oral Oral Oral Oral  SpO2:   98% 97%  Weight:    57.2 kg  Height:        Exam:   alert, nad   no jvd  Chest cta bilat  Cor reg no RG  Abd soft ntnd no ascites   Ext no LE edema   Alert, NF, ox3  AVF+bruit    OP HD: [place] MWF   3.5h  350/500   57kg  2/2 bath  P2  Hep 2000+ 1068mdrun  AVF  - hect 4 ug tiw  - venofer 100 x5 (2 left)  - mircera 60 q2 last 5/25  - home renal: auryxia 2 tid/ norvasc 10/ sensipar 30 hs/ clonidine 0.1 mg bid/ renvela 800 tid  CXR 6/3 - patchy retrocardiac infiltrate, no edema   CXR 6/4 - patchy retrocardiac infiltrate, no edema  Assessment/ Plan: 1. HTN crisis - BP's remain high on home meds (clonidine/ norvasc) + new hydralazine. Will ^clon and hydralazine.  2. SOB - no edema by CXR, +basilar infiltrates,  on abx for suspected CAP. Breathing better.  3. Volume- at dry wt today, 2.4 L off on HD yest w/ late cramping/ chest pain that resolved on rinseback. No vol excess on exam. 4. ESRD - on HD MWF. Missed 2 OP HD sessions. HD yest 6/04 here. Next HD tomorrow.  5. Chest pain - EKG neg, resolved.   6. MBC ckd - cont binder, sensipar 7. Anemia ckd - hb 8-10 here, next esa due 6/08   Rob Breylin Dom 11/11/2020, 6:31 AM   Recent Labs  Lab 11/09/20 2146 11/09/20 2214 11/09/20 2240 11/10/20 0229  K 4.3   < > 4.2 4.8  BUN 63*  --  57* 64*  CREATININE 14.16*  --  15.50* 14.40*  CALCIUM 9.1  --   --  9.2  HGB 8.8*   < > 9.5* 8.8*   < > = values in this interval not displayed.   Inpatient medications: . amLODipine  10 mg Oral Daily  . atorvastatin  20 mg Oral QHS  . cinacalcet  30 mg Oral Q  supper  . cloNIDine  0.1 mg Oral BID  . heparin  5,000 Units Subcutaneous Q8H  . hydrALAZINE  25 mg Oral Q8H  . mirtazapine  15 mg Oral QHS  . multivitamin  1 tablet Oral QHS  . nicotine  7 mg Transdermal Daily  . sevelamer carbonate  800 mg Oral TID with meals  . sodium chloride flush  3 mL Intravenous Q12H   . sodium chloride    . azithromycin 500 mg (11/10/20 1856)  . cefTRIAXone (ROCEPHIN)  IV 1 g (11/11/20 0045)   sodium chloride, acetaminophen **OR** acetaminophen, HYDROmorphone (DILAUDID) injection, ipratropium-albuterol, ondansetron **OR** ondansetron (ZOFRAN) IV, sodium chloride flush

## 2020-11-11 NOTE — Progress Notes (Signed)
[facility] Progress Note   Subjective:  Had chest pain on dialysis yesterday. Terminated treatment early. Net UF 2.4L  Feels much better this am. Denies cp, sob. Has some nausea when eating.   Objective Vitals:   11/10/20 2012 11/11/20 0033 11/11/20 0500 11/11/20 0712  BP: (!) 153/101 (!) 152/86 (!) 180/88 (!) 177/86  Pulse: 80 69 79 81  Resp: '18 18 17 18  '$ Temp: 99.1 F (37.3 C) 98.9 F (37.2 C) 98.1 F (36.7 C) 98 F (36.7 C)  TempSrc: Oral Oral Oral Oral  SpO2:  98% 97% 98%  Weight:   57.2 kg   Height:         Additional Objective Labs: Basic Metabolic Panel: Recent Labs  Lab 11/09/20 2146 11/09/20 2214 11/09/20 2240 11/10/20 0229  NA 143 143 142 142  K 4.3 4.1 4.2 4.8  CL 107  --  110 105  CO2 23  --   --  22  GLUCOSE 101*  --  98 121*  BUN 63*  --  57* 64*  CREATININE 14.16*  --  15.50* 14.40*  CALCIUM 9.1  --   --  9.2   CBC: Recent Labs  Lab 11/09/20 2146 11/09/20 2214 11/09/20 2240 11/10/20 0229  WBC 9.5  --   --  8.3  HGB 8.8* 9.5* 9.5* 8.8*  HCT 28.8* 28.0* 28.0* 28.2*  MCV 90.6  --   --  87.0  PLT 180  --   --  185   Blood Culture    Component Value Date/Time   SDES URINE, RANDOM 06/20/2017 2222   SPECREQUEST NONE 06/20/2017 2222   CULT MULTIPLE SPECIES PRESENT, SUGGEST RECOLLECTION (A) 06/20/2017 2222   REPTSTATUS 06/22/2017 FINAL 06/20/2017 2222     Physical Exam General: Well appearing elderly woman, nad  Heart: RRR no m,r,g Lungs: Clear bilaterally  Abdomen: soft non-tender  Extremities: no LE edema  Dialysis Access: LUE AVF +bruit   Medications: . sodium chloride    . azithromycin 500 mg (11/10/20 1856)  . cefTRIAXone (ROCEPHIN)  IV 1 g (11/11/20 0045)   . amLODipine  10 mg Oral Daily  . atorvastatin  20 mg Oral QHS  . cinacalcet  30 mg Oral Q supper  . cloNIDine  0.1 mg Oral BID  . heparin  5,000 Units Subcutaneous Q8H  . hydrALAZINE  25 mg Oral Q8H  . mirtazapine  15 mg Oral QHS  . multivitamin  1  tablet Oral QHS  . nicotine  7 mg Transdermal Daily  . sevelamer carbonate  800 mg Oral TID with meals  . sodium chloride flush  3 mL Intravenous Q12H    Dialysis Orders:  [place] MWF   3.5h  350/500   57kg  2/2 bath  P2  Hep 2000+ 1043mdrun  - hect 4 ug tiw  - venofer 100 x5 (2 left)  - mircera 60 q2 last 5/25  - home renal: auryxia 2 tid/ norvasc 10/ sensipar 30 hs/ clonidine 0.1 mg bid/ renvela 800 tid  Assessment/Plan: 1.HTN crisis - BP remains elevated. Continue home meds, may need additional agents. Continue lowering volume with HD as tolerated.  2. PNA - multifocal PNA on CXR post HD 6/4 - IV antibiotics per primary.  3.  ESRD - HD MWF. Missed last two HD sessions, transportation issue 1 one of those times. HD today. Next HD Monday if still here.  4. Chest pain - on HD. EKG neg, resolved  5. Volume-  Euvolemic appearing today.  Well below dry weight. Post HD wt 51.9 kg on 6/4.  Will need lower EDW at discharge.  6. 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

## 2020-11-11 NOTE — Progress Notes (Signed)
PROGRESS NOTE    Ashley [name]  ZOX:[id number] DOB: 04/05/1951 DOA: 11/09/2020 PCP: Raymon Mutton., FNP    Chief Complaint  Patient presents with  . Respiratory Distress    Brief Narrative:  The patient is a70 y.o.year-old w/ hx of pancreatitis, ESRD on HD, hep C, HL, HTN, CVA, gastric ulcer/ gastropathy presented to ED  With c/o SOB lasting for 1-2 days. Also abd pain w/ some nausea. Missed last 2 HD sessions as she did not have money for the ride.. +smoker. In ED BP was 220/100, was on bipap then weaned to [place] O2. Pt currently on 2 lit of [place] oxygen. She complained of chest pain during HD, has been hypertensive all throughout the session was on nitro gtt.  She was weaned off nitro gtt and restarted her on home meds. yesterday in the middle of HD, she had chest pain, resolved with termination of HD. EKG does not show any ischemic changes. No chest pain after that. Echocardiogram ordered and pending.   Assessment & Plan:   Principal Problem:   Hypertensive urgency Active Problems:   ESRD (end stage renal disease) on dialysis (HCC)   Epigastric pain   Acute respiratory failure with hypoxia (HCC)   Chronic pancreatitis (HCC)   Acute respiratory failure with hypoxia:  Probably secondary to volume overload secondary to missing HD and multifocal pneumonia.  HD yesterday. Initially required bIPAP, transitioned to oxygen.  She is weaned off oxygen today.  CXR before and after HD shows multifocal pneumonia.    ESRD ON HD: Resume HD as per nephrology.    Mild acute on chronic pancreatitis:  Elevated lipase levels Pain control.    Anemia of chronic disease:  Normocytic.  Hemoglobin stable around 8.8.    Hypertension:  Suboptimally controlled. Increased Hydralazine 50 mg TID.    DVT prophylaxis: (Heparin) Code Status: (Full code) Family Communication: none at bedside.  Disposition:   Status is: Inpatient  Remains inpatient appropriate because:Ongoing diagnostic  testing needed not appropriate for outpatient work up and Unsafe d/c plan   Dispo: The patient is from: Home              Anticipated d/c is to: Home              Patient currently is not medically stable to d/c.   Difficult to place patient No       Consultants:  Nephrology.   Procedures: none.   Antimicrobials:  Antibiotics Given (last 72 hours)    Date/Time Action Medication Dose Rate   11/10/20 1856 New Bag/Given   azithromycin (ZITHROMAX) 500 mg in sodium chloride 0.9 % 250 mL IVPB 500 mg 250 mL/hr   11/11/20 0045 New Bag/Given   cefTRIAXone (ROCEPHIN) 1 g in sodium chloride 0.9 % 100 mL IVPB 1 g 200 mL/hr          Subjective: Pt reports no chest pain, but has nausea and intermittent abd pain.    Objective: Vitals:   11/10/20 2012 11/11/20 0033 11/11/20 0500 11/11/20 0712  BP: (!) 153/101 (!) 152/86 (!) 180/88 (!) 177/86  Pulse: 80 69 79 81  Resp: 18 18 17 18   Temp: 99.1 F (37.3 C) 98.9 F (37.2 C) 98.1 F (36.7 C) 98 F (36.7 C)  TempSrc: Oral Oral Oral Oral  SpO2:  98% 97% 98%  Weight:   57.2 kg   Height:        Intake/Output Summary (Last 24 hours) at 11/11/2020 1414 Last data  filed at 11/11/2020 0900 Gross per 24 hour  Intake 243 ml  Output --  Net 243 ml   Filed Weights   11/10/20 0620 11/10/20 0927 11/11/20 0500  Weight: 54.5 kg 51.9 kg 57.2 kg    Examination:  General exam: Appears calm and comfortable  Respiratory system: Clear to auscultation. Respiratory effort normal. Cardiovascular system: S1 & S2 heard, RRR. No JVD, . No pedal edema. Gastrointestinal system: Abdomen is soft, mildly tender, non distended. .  Normal bowel sounds heard. Central nervous system: Alert and oriented. No focal neurological deficits. Extremities: Symmetric 5 x 5 power. Skin: No rashes, lesions or ulcers Psychiatry: . Mood & affect appropriate.     Data Reviewed: I have personally reviewed following labs and imaging studies  CBC: Recent Labs  Lab  11/09/20 2146 11/09/20 2214 11/09/20 2240 11/10/20 0229  WBC 9.5  --   --  8.3  HGB 8.8* 9.5* 9.5* 8.8*  HCT 28.8* 28.0* 28.0* 28.2*  MCV 90.6  --   --  87.0  PLT 180  --   --  185    Basic Metabolic Panel: Recent Labs  Lab 11/09/20 2146 11/09/20 2214 11/09/20 2240 11/10/20 0229  NA 143 143 142 142  K 4.3 4.1 4.2 4.8  CL 107  --  110 105  CO2 23  --   --  22  GLUCOSE 101*  --  98 121*  BUN 63*  --  57* 64*  CREATININE 14.16*  --  15.50* 14.40*  CALCIUM 9.1  --   --  9.2    GFR: Estimated Creatinine Clearance: 2.8 mL/min (A) (by C-G formula based on SCr of 14.4 mg/dL (H)).  Liver Function Tests: Recent Labs  Lab 11/09/20 2146 11/11/20 1021  AST 14* 15  ALT 12 11  ALKPHOS 55 48  BILITOT 0.6 0.5  PROT 6.6 6.4*  ALBUMIN 3.4* 2.9*    CBG: No results for input(s): GLUCAP in the last 168 hours.   Recent Results (from the past 240 hour(s))  Resp Panel by RT-PCR (Flu A&B, Covid) Nasopharyngeal Swab     Status: None   Collection Time: 11/09/20 10:42 PM   Specimen: Nasopharyngeal Swab; Nasopharyngeal(NP) swabs in vial transport medium  Result Value Ref Range Status   SARS Coronavirus 2 by RT PCR NEGATIVE NEGATIVE Final    Comment: (NOTE) SARS-CoV-2 target nucleic acids are NOT DETECTED.  The SARS-CoV-2 RNA is generally detectable in upper respiratory specimens during the acute phase of infection. The lowest concentration of SARS-CoV-2 viral copies this assay can detect is 138 copies/mL. A negative result does not preclude SARS-Cov-2 infection and should not be used as the sole basis for treatment or other patient management decisions. A negative result may occur with  improper specimen collection/handling, submission of specimen other than nasopharyngeal swab, presence of viral mutation(s) within the areas targeted by this assay, and inadequate number of viral copies(<138 copies/mL). A negative result must be combined with clinical observations, patient  history, and epidemiological information. The expected result is Negative.  Fact Sheet for Patients:  BloggerCourse.com  Fact Sheet for Healthcare Providers:  SeriousBroker.it  This test is no t yet approved or cleared by the [place] FDA and  has been authorized for detection and/or diagnosis of SARS-CoV-2 by FDA under an Emergency Use Authorization (EUA). This EUA will remain  in effect (meaning this test can be used) for the duration of the COVID-19 declaration under Section 564(b)(1) of the Act, 21 U.S.C.section 360bbb-3(b)(1), unless  the authorization is terminated  or revoked sooner.       Influenza A by PCR NEGATIVE NEGATIVE Final   Influenza B by PCR NEGATIVE NEGATIVE Final    Comment: (NOTE) The Xpert Xpress SARS-CoV-2/FLU/RSV plus assay is intended as an aid in the diagnosis of influenza from Nasopharyngeal swab specimens and should not be used as a sole basis for treatment. Nasal washings and aspirates are unacceptable for Xpert Xpress SARS-CoV-2/FLU/RSV testing.  Fact Sheet for Patients: BloggerCourse.com  Fact Sheet for Healthcare Providers: SeriousBroker.it  This test is not yet approved or cleared by the [place] FDA and has been authorized for detection and/or diagnosis of SARS-CoV-2 by FDA under an Emergency Use Authorization (EUA). This EUA will remain in effect (meaning this test can be used) for the duration of the COVID-19 declaration under Section 564(b)(1) of the Act, 21 U.S.C. section 360bbb-3(b)(1), unless the authorization is terminated or revoked.  Performed at [facility], [address] [place], [place] [address]          Radiology Studies: DG Chest 2 View  Result Date: 11/10/2020 CLINICAL DATA:  Shortness of breath, follow-up infiltrates EXAM: CHEST - 2 VIEW COMPARISON:  11/09/2020 FINDINGS: Enlargement of cardiac  silhouette. Mediastinal contours and pulmonary vascularity normal. Atherosclerotic calcification aorta. Hazy infiltrates in both lungs question multifocal pneumonia. Small bibasilar pleural effusions on lateral view. No pneumothorax. Vascular stent LEFT upper extremity. Bones demineralized. IMPRESSION: Persistent patchy infiltrates consistent with multifocal pneumonia. Small bibasilar pleural effusions. Aortic Atherosclerosis (ICD10-I70.0). Electronically Signed   By: Ulyses Southward M.D.   On: 11/10/2020 13:50   DG Chest Portable 1 View  Result Date: 11/09/2020 CLINICAL DATA:  Dyspnea EXAM: PORTABLE CHEST 1 VIEW COMPARISON:  01/07/2020 FINDINGS: Patchy multifocal pulmonary infiltrates are noted within the right mid and lower lung zone and left lung base, possibly infectious or inflammatory in the acute setting. No pneumothorax or pleural effusion. Cardiac size is within normal limits. Pulmonary vascularity is normal. No acute bone abnormality. IMPRESSION: Multifocal pulmonary infiltrates, possibly infectious or inflammatory in the acute setting. Electronically Signed   By: Helyn Numbers MD   On: 11/09/2020 22:13   ECHOCARDIOGRAM COMPLETE  Result Date: 11/10/2020    ECHOCARDIOGRAM REPORT   Patient Name:   Ashley [name] Date of Exam: 11/10/2020 Medical Rec #:  [id number]     Height:       62.0 in Accession #:    [id number]    Weight:       120.1 lb Date of Birth:  08/25/1950     BSA:          1.539 m Patient Age:    70 years      BP:           170/92 mmHg Patient Gender: F             HR:           71 bpm. Exam Location:  Inpatient Procedure: 2D Echo, Cardiac Doppler and Color Doppler Indications:    Chest Pain R07.9  History:        Patient has prior history of Echocardiogram examinations, most                 recent 06/14/2017. Stroke; Risk Factors:Hypertension and                 Dyslipidemia. Hepatitis C. Polysubstance Abuse, Hx of tobacco  abuse.  Sonographer:    Celesta Gentile RCS Referring Phys:  Oliver Hum Kadeidra Coryell IMPRESSIONS  1. Anterior and anterolateral hypokinesis. Left ventricular ejection fraction, by estimation, is 40 to 45%. The left ventricle has mildly decreased function. The left ventricle demonstrates regional wall motion abnormalities (see scoring diagram/findings for description). There is moderate concentric left ventricular hypertrophy. Left ventricular diastolic parameters are consistent with Grade I diastolic dysfunction (impaired relaxation).  2. Right ventricular systolic function is normal. The right ventricular size is normal. There is mildly elevated pulmonary artery systolic pressure.  3. Left atrial size was severely dilated.  4. Right atrial size was severely dilated.  5. The mitral valve is normal in structure. Mild mitral valve regurgitation. No evidence of mitral stenosis.  6. The aortic valve is tricuspid. Aortic valve regurgitation is not visualized. No aortic stenosis is present.  7. The inferior vena cava is normal in size with greater than 50% respiratory variability, suggesting right atrial pressure of 3 mmHg. FINDINGS  Left Ventricle: Anterior and anterolateral hypokinesis. Left ventricular ejection fraction, by estimation, is 40 to 45%. The left ventricle has mildly decreased function. The left ventricle demonstrates regional wall motion abnormalities. The left ventricular internal cavity size was normal in size. There is moderate concentric left ventricular hypertrophy. Left ventricular diastolic parameters are consistent with Grade I diastolic dysfunction (impaired relaxation). Right Ventricle: The right ventricular size is normal. No increase in right ventricular wall thickness. Right ventricular systolic function is normal. There is mildly elevated pulmonary artery systolic pressure. The tricuspid regurgitant velocity is 3.16  m/s, and with an assumed right atrial pressure of 3 mmHg, the estimated right ventricular systolic pressure is 42.9 mmHg. Left Atrium: Left  atrial size was severely dilated. Right Atrium: Right atrial size was severely dilated. Pericardium: Trivial pericardial effusion is present. Mitral Valve: The mitral valve is normal in structure. Mild mitral annular calcification. Mild mitral valve regurgitation. No evidence of mitral valve stenosis. MV peak gradient, 135.5 mmHg. The mean mitral valve gradient is 92.0 mmHg. Tricuspid Valve: The tricuspid valve is normal in structure. Tricuspid valve regurgitation is mild . No evidence of tricuspid stenosis. Aortic Valve: The aortic valve is tricuspid. Aortic valve regurgitation is not visualized. No aortic stenosis is present. Aortic valve mean gradient measures 6.0 mmHg. Aortic valve peak gradient measures 12.5 mmHg. Aortic valve area, by VTI measures 1.51  cm. Pulmonic Valve: The pulmonic valve was normal in structure. Pulmonic valve regurgitation is not visualized. No evidence of pulmonic stenosis. Aorta: The aortic root is normal in size and structure. Venous: The inferior vena cava is normal in size with greater than 50% respiratory variability, suggesting right atrial pressure of 3 mmHg. IAS/Shunts: No atrial level shunt detected by color flow Doppler.  LEFT VENTRICLE PLAX 2D LVIDd:         5.00 cm  Diastology LVIDs:         3.30 cm  LV e' medial:    4.68 cm/s LV PW:         1.40 cm  LV E/e' medial:  22.0 LV IVS:        1.40 cm  LV e' lateral:   3.48 cm/s LVOT diam:     1.90 cm  LV E/e' lateral: 29.6 LV SV:         65 LV SV Index:   42 LVOT Area:     2.84 cm  RIGHT VENTRICLE TAPSE (M-mode): 2.2 cm LEFT ATRIUM  Index       RIGHT ATRIUM           Index LA diam:        4.20 cm  2.73 cm/m  RA Area:     21.00 cm LA Vol (A2C):   123.0 ml 79.90 ml/m RA Volume:   65.20 ml  42.35 ml/m LA Vol (A4C):   85.3 ml  55.41 ml/m LA Biplane Vol: 103.0 ml 66.91 ml/m  AORTIC VALVE AV Area (Vmax):    1.65 cm AV Area (Vmean):   1.78 cm AV Area (VTI):     1.51 cm AV Vmax:           177.00 cm/s AV Vmean:           116.000 cm/s AV VTI:            0.431 m AV Peak Grad:      12.5 mmHg AV Mean Grad:      6.0 mmHg LVOT Vmax:         103.00 cm/s LVOT Vmean:        73.000 cm/s LVOT VTI:          0.230 m LVOT/AV VTI ratio: 0.53  AORTA Ao Root diam: 3.10 cm MITRAL VALVE                 TRICUSPID VALVE MV Area (PHT): 4.06 cm      TR Peak grad:   39.9 mmHg MV Area VTI:   0.32 cm      TR Vmax:        316.00 cm/s MV Peak grad:  135.5 mmHg MV Mean grad:  92.0 mmHg     SHUNTS MV Vmax:       5.82 m/s      Systemic VTI:  0.23 m MV Vmean:      454.0 cm/s    Systemic Diam: 1.90 cm MV Decel Time: 187 msec MR Peak grad:    139.7 mmHg MR Mean grad:    98.0 mmHg MR Vmax:         591.00 cm/s MR Vmean:        476.0 cm/s MR PISA:         2.26 cm MR PISA Eff ROA: 9 mm MR PISA Radius:  0.60 cm MV E velocity: 103.00 cm/s MV A velocity: 131.00 cm/s MV E/A ratio:  0.79 Chilton Si MD Electronically signed by Chilton Si MD Signature Date/Time: 11/10/2020/3:22:02 PM    Final         Scheduled Meds: . amLODipine  10 mg Oral Daily  . atorvastatin  20 mg Oral QHS  . azithromycin  500 mg Oral Daily  . cinacalcet  30 mg Oral Q supper  . cloNIDine  0.1 mg Oral BID  . feeding supplement (NEPRO CARB STEADY)  237 mL Oral BID BM  . heparin  5,000 Units Subcutaneous Q8H  . hydrALAZINE  25 mg Oral Q8H  . mirtazapine  15 mg Oral QHS  . multivitamin  1 tablet Oral QHS  . nicotine  7 mg Transdermal Daily  . sevelamer carbonate  800 mg Oral TID with meals  . sodium chloride flush  3 mL Intravenous Q12H   Continuous Infusions: . sodium chloride    . cefTRIAXone (ROCEPHIN)  IV 1 g (11/11/20 0045)     LOS: 1 day        Kathlen Mody, MD [facility]   To contact the attending provider between 7A-7P or  the covering provider during after hours 7P-7A, please log into the web site [email or web address] and access using universal [place] password for that web site. If you do not have the password, please call the hospital  operator.  11/11/2020, 2:14 PM

## 2020-11-11 NOTE — Progress Notes (Addendum)
Initial Nutrition Assessment  DOCUMENTATION CODES:   Not applicable  INTERVENTION:    Nepro Shake po BID, each supplement provides 425 kcal and 19 grams protein  Continue Rena-vit daily  NUTRITION DIAGNOSIS:   Increased nutrient needs related to chronic illness (ESRD on HD) as evidenced by estimated needs.  GOAL:   Patient will meet greater than or equal to 90% of their needs  MONITOR:   PO intake,Supplement acceptance  REASON FOR ASSESSMENT:   Malnutrition Screening Tool    ASSESSMENT:   70 yo female admitted with SOB after missing two HD sessions. PMH includes ESRD on HD, HTN, chronic pancreatitis, HLD, CVA, hepatitis C, gastric ulcer.   Labs reviewed. Medications reviewed and include Sensipar, Remeron, Rena-vit, Renvela.  Unable to speak with patient today by phone, no answer when room number called.  Per MST, patient reports recent weight loss with recent decreased appetite and poor intake. Per chart review, patient has had some nausea with eating since admission.  S/P HD 6/4, but treatment ended early d/t chest pain. UF 2.4 L.  Weight history reviewed. 3% weight loss within the past 10 months is not severe. Volume overload may be masking weight loss.   NUTRITION - FOCUSED PHYSICAL EXAM:  unable to complete  Diet Order:   Diet Order            Diet Heart Room service appropriate? Yes; Fluid consistency: Thin  Diet effective now                 EDUCATION NEEDS:   Not appropriate for education at this time  Skin:  Skin Assessment: Reviewed RN Assessment  Last BM:  6/5  Height:   Ht Readings from Last 1 Encounters:  11/10/20 '5\' 2"'$  (1.575 m)    Weight:   Wt Readings from Last 1 Encounters:  11/11/20 57.2 kg    Ideal Body Weight:  50 kg  BMI:  Body mass index is 23.05 kg/m.  Estimated Nutritional Needs:   Kcal:  1715-2000  Protein:  70-80 gm  Fluid:  1 L + UOP    Lucas Mallow, RD, LDN, CNSC Please refer to Amion for contact  information.

## 2020-11-12 LAB — TROPONIN I (HIGH SENSITIVITY): Troponin I (High Sensitivity): 13 ng/L (ref <18)

## 2020-11-12 LAB — CBC
HCT: 27.1 % — ABNORMAL LOW (ref 36.0–46.0)
Hemoglobin: 8.5 g/dL — ABNORMAL LOW (ref 12.0–15.0)
MCH: 27.5 pg (ref 26.0–34.0)
MCHC: 31.4 g/dL (ref 30.0–36.0)
MCV: 87.7 fL (ref 80.0–100.0)
Platelets: 177 10*3/uL (ref 150–400)
RBC: 3.09 MIL/uL — ABNORMAL LOW (ref 3.87–5.11)
RDW: 19.5 % — ABNORMAL HIGH (ref 11.5–15.5)
WBC: 6.3 10*3/uL (ref 4.0–10.5)
nRBC: 0 % (ref 0.0–0.2)

## 2020-11-12 LAB — RENAL FUNCTION PANEL
Albumin: 3 g/dL — ABNORMAL LOW (ref 3.5–5.0)
Anion gap: 13 (ref 5–15)
BUN: 52 mg/dL — ABNORMAL HIGH (ref 8–23)
CO2: 24 mmol/L (ref 22–32)
Calcium: 8.3 mg/dL — ABNORMAL LOW (ref 8.9–10.3)
Chloride: 97 mmol/L — ABNORMAL LOW (ref 98–111)
Creatinine, Ser: 11.39 mg/dL — ABNORMAL HIGH (ref 0.44–1.00)
GFR, Estimated: 3 mL/min — ABNORMAL LOW (ref >60)
Glucose, Bld: 83 mg/dL (ref 70–99)
Phosphorus: 5.4 mg/dL — ABNORMAL HIGH (ref 2.5–4.6)
Potassium: 4.4 mmol/L (ref 3.5–5.1)
Sodium: 134 mmol/L — ABNORMAL LOW (ref 135–145)

## 2020-11-12 MED ORDER — PANTOPRAZOLE SODIUM 40 MG PO TBEC
40.0000 mg | DELAYED_RELEASE_TABLET | Freq: Every day | ORAL | Status: DC
  Administered 2020-11-12 – 2020-11-15 (×4): 40 mg via ORAL
  Filled 2020-11-12 (×4): qty 1

## 2020-11-12 MED ORDER — SODIUM CHLORIDE 0.9 % IV SOLN: 100.0000 mL | INTRAVENOUS | Status: DC | PRN

## 2020-11-12 MED ORDER — LIDOCAINE HCL (PF) 1 % IJ SOLN: 5.0000 mL | INTRAMUSCULAR | Status: DC | PRN

## 2020-11-12 MED ORDER — LIDOCAINE-PRILOCAINE 2.5-2.5 % EX CREA: 1.0000 "application " | TOPICAL_CREAM | CUTANEOUS | Status: DC | PRN

## 2020-11-12 MED ORDER — HEPARIN SODIUM (PORCINE) 1000 UNIT/ML DIALYSIS: 1000.0000 [IU] | INTRAMUSCULAR | Status: DC | PRN

## 2020-11-12 MED ORDER — HEPARIN SODIUM (PORCINE) 1000 UNIT/ML DIALYSIS: 2000.0000 [IU] | Freq: Once | INTRAMUSCULAR | Status: DC

## 2020-11-12 MED ORDER — ALTEPLASE 2 MG IJ SOLR: 2.0000 mg | Freq: Once | INTRAMUSCULAR | Status: DC | PRN

## 2020-11-12 MED ORDER — ACETAMINOPHEN 325 MG PO TABS
ORAL_TABLET | ORAL | Status: AC
  Filled 2020-11-12: qty 2

## 2020-11-12 MED ORDER — PENTAFLUOROPROP-TETRAFLUOROETH EX AERO: 1.0000 "application " | INHALATION_SPRAY | CUTANEOUS | Status: DC | PRN

## 2020-11-12 NOTE — Progress Notes (Signed)
PT Cancellation Note  Patient Details Name: Ashley [name] MRN: [id number] DOB: [birth year]   Cancelled Treatment:    Reason Eval/Treat Not Completed: Patient at procedure or test/unavailable. Patient is at HD. Will attempt PT evaluation this pm.    [name] 11/12/2020, 9:17 AM

## 2020-11-12 NOTE — Evaluation (Signed)
Occupational Therapy Evaluation Patient Details Name: Ashley [name] MRN: [id number] DOB: 25-Nov-1950 Today's Date: 11/12/2020    History of Present Illness Ashley [name] is a 70 y.o. female with medical history significant for ESRD on HD, HTN, chronic pancreatitis, HLD, hx of CVA in 2019 who presents for evaluation of SOB.  She reports that on the evening of November 08, 2020 she started to have shortness of breath   Clinical Impression   Pt PTA: Living alone, brother lives in same apt complex. Pt currently, Pt Mod I with mobility and ADL with no physical assist and good safety awareness. O2 on RA >90-93% throughout session. HR <110 BPM and BP: 143/75 to conclude session. Education provided for energy conservation techniques. Pt does not require continued OT skilled services. OT signing off, thank you.    Follow Up Recommendations  No OT follow up    Equipment Recommendations  None recommended by OT    Recommendations for Other Services       Precautions / Restrictions Precautions Precautions: Other (comment) Precaution Comments: watch BP Restrictions Weight Bearing Restrictions: No      Mobility Bed Mobility Overal bed mobility: Modified Independent                  Transfers Overall transfer level: Modified independent Equipment used: None                  Balance Overall balance assessment: Modified Independent                                         ADL either performed or assessed with clinical judgement   ADL Overall ADL's : Modified independent                                       General ADL Comments: Pt performing ADL with no physical assist at sink and commode. Pt simulating shower transfers and mobility with no AD.     Vision Baseline Vision/History: Wears glasses Wears Glasses: Reading only Patient Visual Report: No change from baseline Vision Assessment?: No apparent visual deficits     Perception      Praxis      Pertinent Vitals/Pain Pain Assessment: No/denies pain     Hand Dominance Right   Extremity/Trunk Assessment Upper Extremity Assessment Upper Extremity Assessment: Overall WFL for tasks assessed   Lower Extremity Assessment Lower Extremity Assessment: Overall WFL for tasks assessed   Cervical / Trunk Assessment Cervical / Trunk Assessment: Normal   Communication Communication Communication: No difficulties   Cognition Arousal/Alertness: Awake/alert Behavior During Therapy: WFL for tasks assessed/performed Overall Cognitive Status: Within Functional Limits for tasks assessed                                     General Comments  O2 on RA >90-93% throughout session. HR <110 BPM and BP: 143/75 to conclude session.    Exercises     Shoulder Instructions      Home Living Family/patient expects to be discharged to:: Private residence Living Arrangements: Alone Available Help at Discharge: Family;Available PRN/intermittently Type of Home: Apartment Home Access: Level entry     Home Layout: One level     Bathroom Shower/Tub:  Walk-in shower   Bathroom Toilet: Standard     Home Equipment: None          Prior Functioning/Environment Level of Independence: Independent        Comments: Reports getting groceries and performing own ADL/iADL.        OT Problem List:        OT Treatment/Interventions:      OT Goals(Current goals can be found in the care plan section) Acute Rehab OT Goals Patient Stated Goal: to sleep and then go home OT Goal Formulation: All assessment and education complete, DC therapy Potential to Achieve Goals: Good  OT Frequency:     Barriers to D/C:            Co-evaluation              AM-PAC OT "6 Clicks" Daily Activity     Outcome Measure Help from another person eating meals?: None Help from another person taking care of personal grooming?: None Help from another person toileting, which  includes using toliet, bedpan, or urinal?: None Help from another person bathing (including washing, rinsing, drying)?: None Help from another person to put on and taking off regular upper body clothing?: None Help from another person to put on and taking off regular lower body clothing?: None 6 Click Score: 24   End of Session Equipment Utilized During Treatment: Oxygen Nurse Communication: Mobility status  Activity Tolerance: Patient tolerated treatment well Patient left: in bed;with call bell/phone within reach  OT Visit Diagnosis: Unsteadiness on feet (R26.81)                Time: 1640-1710 OT Time Calculation (min): 30 min Charges:  OT General Charges $OT Visit: 1 Visit OT Evaluation $OT Eval Moderate Complexity: 1 Mod OT Treatments $Self Care/Home Management : 8-22 mins  Jefferey Pica, OTR/L Acute Rehabilitation Services Pager: [phone] Office: [place] 11/12/2020, 8:03 PM

## 2020-11-12 NOTE — Progress Notes (Addendum)
PROGRESS NOTE    Ashley [name]  [id number] DOB: 1951/02/09 DOA: 11/09/2020 PCP: Sonia Side., FNP    Chief Complaint  Patient presents with  . Respiratory Distress    Brief Narrative:  The patient is a70 y.o.year-old w/ hx of pancreatitis, ESRD on HD, hep C, HL, HTN, CVA, gastric ulcer/ gastropathy presented to ED  With c/o SOB lasting for 1-2 days. Also abd pain w/ some nausea. Missed last 2 HD sessions as she did not have money for the ride.. +smoker. In ED BP was 220/100, was on bipap then weaned to [place] O2. She was weaned off nitro gtt and restarted her on home meds. On 11/10/20 pt had some substernal chest pain in the middle of HD,  resolved with termination of HD. EKG does not show any ischemic changes. No chest pain after that. Echocardiogram ordered , showed LV systolic dysfunction when compared to the echo from 2019. discussed with Dr Terrence Dupont, recommended to follow up in the office as outpatient.    Assessment & Plan:   Principal Problem:   Hypertensive urgency Active Problems:   ESRD (end stage renal disease) on dialysis (HCC)   Epigastric pain   Acute respiratory failure with hypoxia (HCC)   Chronic pancreatitis (HCC)   Acute respiratory failure with hypoxia: Volume overload/ multifocal Pneumonia.  Probably secondary to volume overload secondary to missing HD and multifocal pneumonia.  HD MWF.  Initially required bIPAP, transitioned to oxygen.  She is weaned off oxygen today.  CXR before and after HD shows multifocal pneumonia.  Recommend checking CXR in 4 to 6 weeks on discharge to evaluate for resolution of the pneumonia.    ESRD ON HD: Resume HD as per nephrology.    Mild acute on chronic pancreatitis:  abd pain is improving. Continue with protonix.  Elevated lipase levels on admission. Pain control with oxycodone.    Anemia of chronic disease:  Normocytic.  Hemoglobin stable around 8.    Hypertension:  Not well controlled.  Increased  Hydralazine 50 mg TID, continue with amlodipine 10 mg daily, clonidine 0.1 mg BID,.    Substernal chest pain unclear If its due to pancreatitis or uncontrolled bp during HD.  No ischemic changes on EKG.  Echocardiogram shows anterior and anterolateral hypokinesis, with LVEF of 40 to 45%, the left ventricle demonstrates regional wall abnormalities, with mod concentric left ventricular hypertrophy. LV diastolic parameters show grade 1 diastolic dysfunction.  Called Dr Terrence Dupont for a consult, recommended outpatient follow up in the office.    DVT prophylaxis: (Heparin) Code Status: (Full code) Family Communication: none at bedside.  Disposition:   Status is: Inpatient  Remains inpatient appropriate because:Ongoing diagnostic testing needed not appropriate for outpatient work up and Unsafe d/c plan   Dispo: The patient is from: Home              Anticipated d/c is to: Home              Patient currently is not medically stable to d/c.   Difficult to place patient No       Consultants:  Nephrology.   Procedures: none.   Antimicrobials:  Antibiotics Given (last 72 hours)    Date/Time Action Medication Dose Rate   11/10/20 1856 New Bag/Given   azithromycin (ZITHROMAX) 500 mg in sodium chloride 0.9 % 250 mL IVPB 500 mg 250 mL/hr   11/11/20 0045 New Bag/Given   cefTRIAXone (ROCEPHIN) 1 g in sodium chloride 0.9 % 100 mL IVPB  1 g 200 mL/hr   11/11/20 1645 Given   azithromycin (ZITHROMAX) tablet 500 mg 500 mg    11/11/20 2234 New Bag/Given   cefTRIAXone (ROCEPHIN) 1 g in sodium chloride 0.9 % 100 mL IVPB 1 g 200 mL/hr         Subjective: Improving nausea, and abd pain.     Objective: Vitals:   11/12/20 1200 11/12/20 1245 11/12/20 1500 11/12/20 1516  BP: (!) 206/104 (!) 214/91 (!) 158/96 (!) 158/96  Pulse: 78 (!) 101 85 86  Resp:    18  Temp:  97.9 F (36.6 C)    TempSrc:  Oral    SpO2:   98% 96%  Weight:  56.6 kg    Height:        Intake/Output Summary (Last 24  hours) at 11/12/2020 1549 Last data filed at 11/12/2020 1245 Gross per 24 hour  Intake 360 ml  Output 1000 ml  Net -640 ml   Filed Weights   11/11/20 0500 11/12/20 0900 11/12/20 1245  Weight: 57.2 kg 57 kg 56.6 kg    Examination:  General exam: elderly woman, on RA, not in any distress.  Respiratory system: Clear to auscultation, no wheezing heard  Cardiovascular system: S1 & S2 heard, RRR. No JVD, . No pedal edema. Gastrointestinal system: Abdomen is soft mildly tender in the epigastric area, nondistended bowel sounds within normal limits Central nervous system: Alert and oriented, grossly nonfocal Extremities: No pedal edema or cyanosis Skin: No rashes seen Psychiatry: Mood is appropriate   Data Reviewed: I have personally reviewed following labs and imaging studies  CBC: Recent Labs  Lab 11/09/20 2146 11/09/20 2214 11/09/20 2240 11/10/20 0229 11/12/20 1007  WBC 9.5  --   --  8.3 6.3  HGB 8.8* 9.5* 9.5* 8.8* 8.5*  HCT 28.8* 28.0* 28.0* 28.2* 27.1*  MCV 90.6  --   --  87.0 87.7  PLT 180  --   --  185 [id number]    Basic Metabolic Panel: Recent Labs  Lab 11/09/20 2146 11/09/20 2214 11/09/20 2240 11/10/20 0229 11/12/20 1007  NA 143 143 142 142 134*  K 4.3 4.1 4.2 4.8 4.4  CL 107  --  110 105 97*  CO2 23  --   --  22 24  GLUCOSE 101*  --  98 121* 83  BUN 63*  --  57* 64* 52*  CREATININE 14.16*  --  15.50* 14.40* 11.39*  CALCIUM 9.1  --   --  9.2 8.3*  PHOS  --   --   --   --  5.4*    GFR: Estimated Creatinine Clearance: 3.5 mL/min (A) (by C-G formula based on SCr of 11.39 mg/dL (H)).  Liver Function Tests: Recent Labs  Lab 11/09/20 2146 11/11/20 1021 11/12/20 1007  AST 14* 15  --   ALT 12 11  --   ALKPHOS 55 48  --   BILITOT 0.6 0.5  --   PROT 6.6 6.4*  --   ALBUMIN 3.4* 2.9* 3.0*    CBG: No results for input(s): GLUCAP in the last 168 hours.   Recent Results (from the past 240 hour(s))  Resp Panel by RT-PCR (Flu A&B, Covid) Nasopharyngeal Swab      Status: None   Collection Time: 11/09/20 10:42 PM   Specimen: Nasopharyngeal Swab; Nasopharyngeal(NP) swabs in vial transport medium  Result Value Ref Range Status   SARS Coronavirus 2 by RT PCR NEGATIVE NEGATIVE Final    Comment: (NOTE) SARS-CoV-2 target  nucleic acids are NOT DETECTED.  The SARS-CoV-2 RNA is generally detectable in upper respiratory specimens during the acute phase of infection. The lowest concentration of SARS-CoV-2 viral copies this assay can detect is 138 copies/mL. A negative result does not preclude SARS-Cov-2 infection and should not be used as the sole basis for treatment or other patient management decisions. A negative result may occur with  improper specimen collection/handling, submission of specimen other than nasopharyngeal swab, presence of viral mutation(s) within the areas targeted by this assay, and inadequate number of viral copies(<138 copies/mL). A negative result must be combined with clinical observations, patient history, and epidemiological information. The expected result is Negative.  Fact Sheet for Patients:  EntrepreneurPulse.com.au  Fact Sheet for Healthcare Providers:  IncredibleEmployment.be  This test is no t yet approved or cleared by the [place] FDA and  has been authorized for detection and/or diagnosis of SARS-CoV-2 by FDA under an Emergency Use Authorization (EUA). This EUA will remain  in effect (meaning this test can be used) for the duration of the COVID-19 declaration under Section 564(b)(1) of the Act, 21 U.S.C.section 360bbb-3(b)(1), unless the authorization is terminated  or revoked sooner.       Influenza A by PCR NEGATIVE NEGATIVE Final   Influenza B by PCR NEGATIVE NEGATIVE Final    Comment: (NOTE) The Xpert Xpress SARS-CoV-2/FLU/RSV plus assay is intended as an aid in the diagnosis of influenza from Nasopharyngeal swab specimens and should not be used as a sole basis  for treatment. Nasal washings and aspirates are unacceptable for Xpert Xpress SARS-CoV-2/FLU/RSV testing.  Fact Sheet for Patients: EntrepreneurPulse.com.au  Fact Sheet for Healthcare Providers: IncredibleEmployment.be  This test is not yet approved or cleared by the [place] FDA and has been authorized for detection and/or diagnosis of SARS-CoV-2 by FDA under an Emergency Use Authorization (EUA). This EUA will remain in effect (meaning this test can be used) for the duration of the COVID-19 declaration under Section 564(b)(1) of the Act, 21 U.S.C. section 360bbb-3(b)(1), unless the authorization is terminated or revoked.  Performed at [facility], [place] [address], [place], [place] [address]          Radiology Studies: No results found.      Scheduled Meds: . amLODipine  10 mg Oral Daily  . atorvastatin  20 mg Oral QHS  . azithromycin  500 mg Oral Daily  . cinacalcet  30 mg Oral Q supper  . cloNIDine  0.1 mg Oral BID  . feeding supplement (NEPRO CARB STEADY)  237 mL Oral BID BM  . heparin  5,000 Units Subcutaneous Q8H  . hydrALAZINE  50 mg Oral Q8H  . mirtazapine  15 mg Oral QHS  . multivitamin  1 tablet Oral QHS  . nicotine  7 mg Transdermal Daily  . pantoprazole  40 mg Oral Daily  . sevelamer carbonate  800 mg Oral TID with meals  . sodium chloride flush  3 mL Intravenous Q12H   Continuous Infusions: . sodium chloride    . cefTRIAXone (ROCEPHIN)  IV 1 g (11/11/20 2234)     LOS: 2 days        Hosie Poisson, MD [facility]   To contact the attending provider between 7A-7P or the covering provider during after hours 7P-7A, please log into the web site [email or web address] and access using universal [place] password for that web site. If you do not have the password, please call the hospital operator.  11/12/2020, 3:49 PM

## 2020-11-12 NOTE — Progress Notes (Signed)
PT Cancellation Note  Patient Details Name: ALISHAH LOGEMANN MRN: [id number] DOB: 14-Dec-1950   Cancelled Treatment:    Reason Eval/Treat Not Completed: Patient not medically ready. Patient returned from HD, BP continues to be very high, spoke with RN who asks we hold for today. Will re-attempt PT evaluation tomorrow.   Anahli Arvanitis 11/12/2020, 2:47 PM

## 2020-11-12 NOTE — Progress Notes (Addendum)
[facility] Progress Note   Subjective: Starting HD standing st 57 kg. BP high. Challenging EDW today. Feels better.    Objective Vitals:   11/11/20 1215 11/11/20 1415 11/11/20 1615 11/12/20 0401  BP: (!) 148/87 (!) 149/77 (!) 141/72 (!) 154/81  Pulse: 69 70  77  Resp:    19  Temp:    98.6 F (37 C)  TempSrc:    Oral  SpO2: 97% 100%  97%  Weight:      Height:       Physical Exam General: Pleasant elderly female in NAD Heart: S1,S2 RRR SR on monitor.  Lungs: CTAB Abdomen: Soft, NT Extremities:no LE edema Dialysis Access: L AVF cannulated  Additional Objective Labs: Basic Metabolic Panel: Recent Labs  Lab 11/09/20 2146 11/09/20 2214 11/09/20 2240 11/10/20 0229  NA 143 143 142 142  K 4.3 4.1 4.2 4.8  CL 107  --  110 105  CO2 23  --   --  22  GLUCOSE 101*  --  98 121*  BUN 63*  --  57* 64*  CREATININE 14.16*  --  15.50* 14.40*  CALCIUM 9.1  --   --  9.2   Liver Function Tests: Recent Labs  Lab 11/09/20 2146 11/11/20 1021  AST 14* 15  ALT 12 11  ALKPHOS 55 48  BILITOT 0.6 0.5  PROT 6.6 6.4*  ALBUMIN 3.4* 2.9*   Recent Labs  Lab 11/10/20 0229 11/11/20 1021  LIPASE 61* 53*   CBC: Recent Labs  Lab 11/09/20 2146 11/09/20 2214 11/09/20 2240 11/10/20 0229  WBC 9.5  --   --  8.3  HGB 8.8* 9.5* 9.5* 8.8*  HCT 28.8* 28.0* 28.0* 28.2*  MCV 90.6  --   --  87.0  PLT 180  --   --  185   Blood Culture    Component Value Date/Time   SDES URINE, RANDOM 06/20/2017 2222   SPECREQUEST NONE 06/20/2017 2222   CULT MULTIPLE SPECIES PRESENT, SUGGEST RECOLLECTION (A) 06/20/2017 2222   REPTSTATUS 06/22/2017 FINAL 06/20/2017 2222    Cardiac Enzymes: No results for input(s): CKTOTAL, CKMB, CKMBINDEX, TROPONINI in the last 168 hours. CBG: No results for input(s): GLUCAP in the last 168 hours. Iron Studies: No results for input(s): IRON, TIBC, TRANSFERRIN, FERRITIN in the last 72 hours. '@lablastinr3'$ @ Studies/Results: DG Chest 2 View  Result  Date: 11/10/2020 CLINICAL DATA:  Shortness of breath, follow-up infiltrates EXAM: CHEST - 2 VIEW COMPARISON:  11/09/2020 FINDINGS: Enlargement of cardiac silhouette. Mediastinal contours and pulmonary vascularity normal. Atherosclerotic calcification aorta. Hazy infiltrates in both lungs question multifocal pneumonia. Small bibasilar pleural effusions on lateral view. No pneumothorax. Vascular stent LEFT upper extremity. Bones demineralized. IMPRESSION: Persistent patchy infiltrates consistent with multifocal pneumonia. Small bibasilar pleural effusions. Aortic Atherosclerosis (ICD10-I70.0). Electronically Signed   By: Lavonia Dana M.D.   On: 11/10/2020 13:50   ECHOCARDIOGRAM COMPLETE  Result Date: 11/10/2020    ECHOCARDIOGRAM REPORT   Patient Name:   Ashley [name] Date of Exam: 11/10/2020 Medical Rec #:  [id number]     Height:       62.0 in Accession #:    [id number]    Weight:       120.1 lb Date of Birth:  [birth year]     BSA:          1.539 m Patient Age:    70 years      BP:           170/92 mmHg Patient  Gender: F             HR:           71 bpm. Exam Location:  Inpatient Procedure: 2D Echo, Cardiac Doppler and Color Doppler Indications:    Chest Pain R07.9  History:        Patient has prior history of Echocardiogram examinations, most                 recent 06/14/2017. Stroke; Risk Factors:Hypertension and                 Dyslipidemia. Hepatitis C. Polysubstance Abuse, Hx of tobacco                 abuse.  Sonographer:    Alvino Chapel RCS Referring Phys: Theola Sequin IMPRESSIONS  1. Anterior and anterolateral hypokinesis. Left ventricular ejection fraction, by estimation, is 40 to 45%. The left ventricle has mildly decreased function. The left ventricle demonstrates regional wall motion abnormalities (see scoring diagram/findings for description). There is moderate concentric left ventricular hypertrophy. Left ventricular diastolic parameters are consistent with Grade I diastolic dysfunction (impaired  relaxation).  2. Right ventricular systolic function is normal. The right ventricular size is normal. There is mildly elevated pulmonary artery systolic pressure.  3. Left atrial size was severely dilated.  4. Right atrial size was severely dilated.  5. The mitral valve is normal in structure. Mild mitral valve regurgitation. No evidence of mitral stenosis.  6. The aortic valve is tricuspid. Aortic valve regurgitation is not visualized. No aortic stenosis is present.  7. The inferior vena cava is normal in size with greater than 50% respiratory variability, suggesting right atrial pressure of 3 mmHg. FINDINGS  Left Ventricle: Anterior and anterolateral hypokinesis. Left ventricular ejection fraction, by estimation, is 40 to 45%. The left ventricle has mildly decreased function. The left ventricle demonstrates regional wall motion abnormalities. The left ventricular internal cavity size was normal in size. There is moderate concentric left ventricular hypertrophy. Left ventricular diastolic parameters are consistent with Grade I diastolic dysfunction (impaired relaxation). Right Ventricle: The right ventricular size is normal. No increase in right ventricular wall thickness. Right ventricular systolic function is normal. There is mildly elevated pulmonary artery systolic pressure. The tricuspid regurgitant velocity is 3.16  m/s, and with an assumed right atrial pressure of 3 mmHg, the estimated right ventricular systolic pressure is 0000000 mmHg. Left Atrium: Left atrial size was severely dilated. Right Atrium: Right atrial size was severely dilated. Pericardium: Trivial pericardial effusion is present. Mitral Valve: The mitral valve is normal in structure. Mild mitral annular calcification. Mild mitral valve regurgitation. No evidence of mitral valve stenosis. MV peak gradient, 135.5 mmHg. The mean mitral valve gradient is 92.0 mmHg. Tricuspid Valve: The tricuspid valve is normal in structure. Tricuspid valve  regurgitation is mild . No evidence of tricuspid stenosis. Aortic Valve: The aortic valve is tricuspid. Aortic valve regurgitation is not visualized. No aortic stenosis is present. Aortic valve mean gradient measures 6.0 mmHg. Aortic valve peak gradient measures 12.5 mmHg. Aortic valve area, by VTI measures 1.51  cm. Pulmonic Valve: The pulmonic valve was normal in structure. Pulmonic valve regurgitation is not visualized. No evidence of pulmonic stenosis. Aorta: The aortic root is normal in size and structure. Venous: The inferior vena cava is normal in size with greater than 50% respiratory variability, suggesting right atrial pressure of 3 mmHg. IAS/Shunts: No atrial level shunt detected by color flow Doppler.  LEFT VENTRICLE PLAX 2D  LVIDd:         5.00 cm  Diastology LVIDs:         3.30 cm  LV e' medial:    4.68 cm/s LV PW:         1.40 cm  LV E/e' medial:  22.0 LV IVS:        1.40 cm  LV e' lateral:   3.48 cm/s LVOT diam:     1.90 cm  LV E/e' lateral: 29.6 LV SV:         65 LV SV Index:   42 LVOT Area:     2.84 cm  RIGHT VENTRICLE TAPSE (M-mode): 2.2 cm LEFT ATRIUM              Index       RIGHT ATRIUM           Index LA diam:        4.20 cm  2.73 cm/m  RA Area:     21.00 cm LA Vol (A2C):   123.0 ml 79.90 ml/m RA Volume:   65.20 ml  42.35 ml/m LA Vol (A4C):   85.3 ml  55.41 ml/m LA Biplane Vol: 103.0 ml 66.91 ml/m  AORTIC VALVE AV Area (Vmax):    1.65 cm AV Area (Vmean):   1.78 cm AV Area (VTI):     1.51 cm AV Vmax:           177.00 cm/s AV Vmean:          116.000 cm/s AV VTI:            0.431 m AV Peak Grad:      12.5 mmHg AV Mean Grad:      6.0 mmHg LVOT Vmax:         103.00 cm/s LVOT Vmean:        73.000 cm/s LVOT VTI:          0.230 m LVOT/AV VTI ratio: 0.53  AORTA Ao Root diam: 3.10 cm MITRAL VALVE                 TRICUSPID VALVE MV Area (PHT): 4.06 cm      TR Peak grad:   39.9 mmHg MV Area VTI:   0.32 cm      TR Vmax:        316.00 cm/s MV Peak grad:  135.5 mmHg MV Mean grad:  92.0 mmHg      SHUNTS MV Vmax:       5.82 m/s      Systemic VTI:  0.23 m MV Vmean:      454.0 cm/s    Systemic Diam: 1.90 cm MV Decel Time: 187 msec MR Peak grad:    139.7 mmHg MR Mean grad:    98.0 mmHg MR Vmax:         591.00 cm/s MR Vmean:        476.0 cm/s MR PISA:         2.26 cm MR PISA Eff ROA: 9 mm MR PISA Radius:  0.60 cm MV E velocity: 103.00 cm/s MV A velocity: 131.00 cm/s MV E/A ratio:  0.79 Skeet Latch MD Electronically signed by Skeet Latch MD Signature Date/Time: 11/10/2020/3:22:02 PM    Final    Medications: . sodium chloride    . cefTRIAXone (ROCEPHIN)  IV 1 g (11/11/20 2234)   . amLODipine  10 mg Oral Daily  . atorvastatin  20 mg Oral QHS  . azithromycin  500 mg  Oral Daily  . cinacalcet  30 mg Oral Q supper  . cloNIDine  0.1 mg Oral BID  . feeding supplement (NEPRO CARB STEADY)  237 mL Oral BID BM  . heparin  5,000 Units Subcutaneous Q8H  . hydrALAZINE  50 mg Oral Q8H  . mirtazapine  15 mg Oral QHS  . multivitamin  1 tablet Oral QHS  . nicotine  7 mg Transdermal Daily  . pantoprazole  40 mg Oral Daily  . sevelamer carbonate  800 mg Oral TID with meals  . sodium chloride flush  3 mL Intravenous Q12H     Dialysis Orders:  [place] MWF 3.5h 350/500 57kg 2/2 bath P2  -Heparin 2000 units initial bolus+ 1000  Units IV midrun - hect 4 ug tiw - venofer 100 mg IV x5 (2 left) - mircera 60 mcg IV  q2 last 5/25 - home renal: auryxia 2 tid/ norvasc 10/ sensipar 30 hs/ clonidine 0.1 mg bid/ renvela 800 tid  Assessment/Plan: 1.HTN crisis - BP remains elevated. Continue home meds, may need additional agents. Continue lowering volume with HD as tolerated.  2. PNA - multifocal PNA on CXR post HD 6/4 - IV antibiotics per primary.  3.  ESRD - HD MWF. Missed last two HD sessions, transportation issue 1 one of those times. HD today. Next HD Monday if still here.  4. Chest pain - on HD. EKG neg, resolved  5. Volume-  Euvolemic appearing today. Well below dry weight. Post HD wt  51.9 kg on 6/4.  Will need lower EDW at discharge.  6. 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 Nutrition - 2.9 change to renal diet with fluid restrictions. Add nepro.   Rita H. Brown NP-C 11/12/2020, 8:55 AM  [facility] [phone]  Nephrology attending: Patient was seen and examined at dialysis unit.  Chart reviewed.  I agree with assessment and plan as outlined above. 70 year old female ESRD on HD, hypertension admitted with elevated blood pressure.  We are attempting to ultrafiltrate during dialysis.  Hydralazine dose increased yesterday.  Continue current antihypertensive medication and monitor BP.  Katheran James, MD [facility].

## 2020-11-13 LAB — CBC WITH DIFFERENTIAL/PLATELET
Abs Immature Granulocytes: 0.02 10*3/uL (ref 0.00–0.07)
Basophils Absolute: 0 10*3/uL (ref 0.0–0.1)
Basophils Relative: 0 %
Eosinophils Absolute: 0.3 10*3/uL (ref 0.0–0.5)
Eosinophils Relative: 5 %
HCT: 25.1 % — ABNORMAL LOW (ref 36.0–46.0)
Hemoglobin: 8 g/dL — ABNORMAL LOW (ref 12.0–15.0)
Immature Granulocytes: 0 %
Lymphocytes Relative: 24 %
Lymphs Abs: 1.5 10*3/uL (ref 0.7–4.0)
MCH: 27.8 pg (ref 26.0–34.0)
MCHC: 31.9 g/dL (ref 30.0–36.0)
MCV: 87.2 fL (ref 80.0–100.0)
Monocytes Absolute: 0.9 10*3/uL (ref 0.1–1.0)
Monocytes Relative: 14 %
Neutro Abs: 3.6 10*3/uL (ref 1.7–7.7)
Neutrophils Relative %: 57 %
Platelets: 167 10*3/uL (ref 150–400)
RBC: 2.88 MIL/uL — ABNORMAL LOW (ref 3.87–5.11)
RDW: 19.6 % — ABNORMAL HIGH (ref 11.5–15.5)
WBC: 6.3 10*3/uL (ref 4.0–10.5)
nRBC: 0 % (ref 0.0–0.2)

## 2020-11-13 LAB — BASIC METABOLIC PANEL
Anion gap: 11 (ref 5–15)
BUN: 34 mg/dL — ABNORMAL HIGH (ref 8–23)
CO2: 28 mmol/L (ref 22–32)
Calcium: 8.3 mg/dL — ABNORMAL LOW (ref 8.9–10.3)
Chloride: 95 mmol/L — ABNORMAL LOW (ref 98–111)
Creatinine, Ser: 6.05 mg/dL — ABNORMAL HIGH (ref 0.44–1.00)
GFR, Estimated: 7 mL/min — ABNORMAL LOW (ref >60)
Glucose, Bld: 95 mg/dL (ref 70–99)
Potassium: 4.2 mmol/L (ref 3.5–5.1)
Sodium: 134 mmol/L — ABNORMAL LOW (ref 135–145)

## 2020-11-13 MED ORDER — CHLORHEXIDINE GLUCONATE CLOTH 2 % EX PADS: 6.0000 | MEDICATED_PAD | Freq: Every day | CUTANEOUS | Status: DC

## 2020-11-13 MED ORDER — METOPROLOL SUCCINATE ER 25 MG PO TB24
12.5000 mg | ORAL_TABLET | Freq: Every day | ORAL | Status: DC
  Administered 2020-11-13 – 2020-11-15 (×3): 12.5 mg via ORAL
  Filled 2020-11-13 (×3): qty 1

## 2020-11-13 NOTE — Progress Notes (Addendum)
[facility] Progress Note   Subjective: Seen in room, eating Breakfast. BP improved but could be better. On hydralazine without BB.Adjust meds today.     Objective Vitals:   11/12/20 1700 11/12/20 1947 11/13/20 0148 11/13/20 0350  BP: (!) 144/96 (!) 151/73 (!) 147/63 (!) 173/94  Pulse: 91 85 82 97  Resp:  '17 18 18  '$ Temp:  98.3 F (36.8 C) 98.4 F (36.9 C) 98.4 F (36.9 C)  TempSrc:  Oral Oral Oral  SpO2: 96% 92% 95% 94%  Weight:      Height:       Physical Exam General: Pleasant elderly female in NAD Heart: S1,S2 RRR SR on monitor.  Lungs: CTAB Abdomen: Soft, NT Extremities:no LE edema Dialysis Access: L AVF + T/B  Additional Objective Labs: Basic Metabolic Panel: Recent Labs  Lab 11/10/20 0229 11/12/20 1007 11/13/20 0319  NA 142 134* 134*  K 4.8 4.4 4.2  CL 105 97* 95*  CO2 '22 24 28  '$ GLUCOSE 121* 83 95  BUN 64* 52* 34*  CREATININE 14.40* 11.39* 6.05*  CALCIUM 9.2 8.3* 8.3*  PHOS  --  5.4*  --    Liver Function Tests: Recent Labs  Lab 11/09/20 2146 11/11/20 1021 11/12/20 1007  AST 14* 15  --   ALT 12 11  --   ALKPHOS 55 48  --   BILITOT 0.6 0.5  --   PROT 6.6 6.4*  --   ALBUMIN 3.4* 2.9* 3.0*   Recent Labs  Lab 11/10/20 0229 11/11/20 1021  LIPASE 61* 53*   CBC: Recent Labs  Lab 11/09/20 2146 11/09/20 2214 11/10/20 0229 11/12/20 1007 11/13/20 0319  WBC 9.5  --  8.3 6.3 6.3  NEUTROABS  --   --   --   --  3.6  HGB 8.8*   < > 8.8* 8.5* 8.0*  HCT 28.8*   < > 28.2* 27.1* 25.1*  MCV 90.6  --  87.0 87.7 87.2  PLT 180  --  185 177 167   < > = values in this interval not displayed.   Blood Culture    Component Value Date/Time   SDES URINE, RANDOM 06/20/2017 2222   SPECREQUEST NONE 06/20/2017 2222   CULT MULTIPLE SPECIES PRESENT, SUGGEST RECOLLECTION (A) 06/20/2017 2222   REPTSTATUS 06/22/2017 FINAL 06/20/2017 2222    Cardiac Enzymes: No results for input(s): CKTOTAL, CKMB, CKMBINDEX, TROPONINI in the last 168  hours. CBG: No results for input(s): GLUCAP in the last 168 hours. Iron Studies: No results for input(s): IRON, TIBC, TRANSFERRIN, FERRITIN in the last 72 hours. '@lablastinr3'$ @ Studies/Results: No results found. Medications: . sodium chloride    . cefTRIAXone (ROCEPHIN)  IV 1 g (11/13/20 0104)   . amLODipine  10 mg Oral Daily  . atorvastatin  20 mg Oral QHS  . azithromycin  500 mg Oral Daily  . cinacalcet  30 mg Oral Q supper  . cloNIDine  0.1 mg Oral BID  . feeding supplement (NEPRO CARB STEADY)  237 mL Oral BID BM  . heparin  5,000 Units Subcutaneous Q8H  . hydrALAZINE  50 mg Oral Q8H  . mirtazapine  15 mg Oral QHS  . multivitamin  1 tablet Oral QHS  . nicotine  7 mg Transdermal Daily  . pantoprazole  40 mg Oral Daily  . sevelamer carbonate  800 mg Oral TID with meals  . sodium chloride flush  3 mL Intravenous Q12H     Dialysis Orders: [facility] MWF 3.5h 350/500 57kg  2/2 bath P2  -Heparin 2000 units initial bolus+ 1000  Units IV midrun - hect 4 ug tiw - venofer 100 mg IV x5 (2 left) - mircera 60 mcg IV  q2 last 5/25 - home renal: auryxia 2 tid/ norvasc 10/ sensipar 30 hs/ clonidine 0.1 mg bid/ renvela 800 tid  Assessment/Plan: 1.HTN crisis - BP remains elevated. Continue home meds, may need additional agents. Will add Metoprolol succinate 12.5 mg PO q day and uptitrate. Continue lowering volume with HD as tolerated.  2. PNA - multifocal PNA on CXR post HD 6/4 - IV antibiotics per primary.  3.ESRD - HD MWF.Next HD 11/14/20 4. Chest pain - on HD. EKG neg, resolved  5.Volume-Euvolemic appearing today. Well below dry weight.HD 06/06 Net UF 1 liter. Post wt 56.6, Will need lower EDW at discharge.  6. 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 Nutrition - 2.9 change to renal diet with fluid restrictions. Add nepro.   Rita H. Brown NP-C 11/13/2020, 8:44 AM  [facility] [phone]  Nephrology  attending: Patient was seen and examined.  Chart reviewed.  I agree with assessment plan as outlined above. Adjusting antihypertensive medications, added metoprolol today.  Monitor BP.  Volume status acceptable.  Tolerating dialysis well with plan for next HD tomorrow.  Clinically stable.  Katheran James, MD [facility].

## 2020-11-13 NOTE — Evaluation (Signed)
Physical Therapy Evaluation Patient Details Name: Ashley [name] MRN: [id number] DOB: Feb 20, 1951 Today's Date: 11/13/2020   History of Present Illness  Pt is a 70 y.o. female admitted 11/09/20 with SOB, abdominal pain. Workup for acute hypoxic respiratory failure secondary to volume overload, multifocal PNA. PMH includes ESRD on HD, HTN, pancreatitis, CVA (2019).    Clinical Impression  Pt presents with an overall decrease in functional mobility secondary to above. PTA, pt independent, lives alone with brother next door. Today, pt mobilizing well with intermittent min guard for balance; bouts of self-corrected instability which pt attributes to fatigue and BLE weakness. Encouraged more frequent OOB activity with supervision from nursing staff; pt reports she will use walker if she feels she needs it for balance. Pt would benefit from continued acute PT services to maximize functional mobility and independence prior to d/c home.   Post-ambulation BP 179/84, HR 88    Follow Up Recommendations No PT follow up    Equipment Recommendations  None recommended by PT    Recommendations for Other Services       Precautions / Restrictions Precautions Precautions: Fall;Other (comment) Precaution Comments: HTN Restrictions Weight Bearing Restrictions: No      Mobility  Bed Mobility Overal bed mobility: Modified Independent                  Transfers Overall transfer level: Needs assistance Equipment used: None Transfers: Sit to/from Stand Sit to Stand: Supervision            Ambulation/Gait Ambulation/Gait assistance: Supervision;Min guard Gait Distance (Feet): 112 Feet Assistive device: None Gait Pattern/deviations: Step-through pattern;Decreased stride length;Drifts right/left Gait velocity: Decreased   General Gait Details: Slow, mildly unsteady gait without DME, intermittent min guard for balance with 2x self-corrected LOB; pt endorses "feeling weak" especially legs;  post-ambulation BP 179/84  Stairs            Wheelchair Mobility    Modified Rankin (Stroke Patients Only)       Balance Overall balance assessment: Needs assistance   Sitting balance-Leahy Scale: Good       Standing balance-Leahy Scale: Good Standing balance comment: Static standing and ambulating without DME; able to self-correct instability                             Pertinent Vitals/Pain Pain Assessment: No/denies pain    Home Living Family/patient expects to be discharged to:: Private residence Living Arrangements: Alone Available Help at Discharge: Family;Available PRN/intermittently Type of Home: Apartment Home Access: Level entry     Home Layout: One level Home Equipment: Walker - 2 wheels      Prior Function Level of Independence: Independent         Comments: Independent without DME; does not drive; reports getting groceries and performing own ADL/iADL.     Hand Dominance        Extremity/Trunk Assessment   Upper Extremity Assessment Upper Extremity Assessment: Overall WFL for tasks assessed    Lower Extremity Assessment Lower Extremity Assessment: Overall WFL for tasks assessed       Communication   Communication: No difficulties  Cognition Arousal/Alertness: Awake/alert Behavior During Therapy: WFL for tasks assessed/performed Overall Cognitive Status: Within Functional Limits for tasks assessed  General Comments General comments (skin integrity, edema, etc.): HR 88, post-ambulation BP 179/84    Exercises     Assessment/Plan    PT Assessment Patient needs continued PT services  PT Problem List Decreased activity tolerance;Decreased balance;Decreased mobility;Cardiopulmonary status limiting activity       PT Treatment Interventions DME instruction;Gait training;Stair training;Functional mobility training;Therapeutic activities;Therapeutic  exercise;Balance training;Patient/family education    PT Goals (Current goals can be found in the Care Plan section)  Acute Rehab PT Goals Patient Stated Goal: Return home soon PT Goal Formulation: With patient Time For Goal Achievement: 11/27/20 Potential to Achieve Goals: Good    Frequency Min 3X/week   Barriers to discharge        Co-evaluation               AM-PAC PT "6 Clicks" Mobility  Outcome Measure Help needed turning from your back to your side while in a flat bed without using bedrails?: None Help needed moving from lying on your back to sitting on the side of a flat bed without using bedrails?: None Help needed moving to and from a bed to a chair (including a wheelchair)?: A Little Help needed standing up from a chair using your arms (e.g., wheelchair or bedside chair)?: A Little Help needed to walk in hospital room?: A Little Help needed climbing 3-5 steps with a railing? : A Little 6 Click Score: 20    End of Session Equipment Utilized During Treatment: Gait belt Activity Tolerance: Patient tolerated treatment well;Patient limited by fatigue Patient left: in bed;with call bell/phone within reach;with bed alarm set Nurse Communication: Mobility status PT Visit Diagnosis: Other abnormalities of gait and mobility (R26.89)    Time: 1441-1456 PT Time Calculation (min) (ACUTE ONLY): 15 min   Charges:   PT Evaluation $PT Eval Low Complexity: [place], PT, DPT Acute Rehabilitation Services  Pager [phone] Office [phone]  Derry Lory 11/13/2020, 3:16 PM

## 2020-11-13 NOTE — Progress Notes (Signed)
PROGRESS NOTE    Ashley [name]  [id number] DOB: Mar 22, 1951 DOA: 11/09/2020 PCP: Raymon Mutton., FNP    Brief Narrative:  Ashley [name] is a 70 year old female with past medical history significant for pancreatitis, ESRD on HD M WF, hepatitis C, hyperlipidemia, essential hypertension, history of CVA, history of gastric ulcer/gastropathy who presented to [facility] ED on 11/09/2020 with progressive shortness of breath.  Patient symptoms present for the last 1-2 days associate with abdominal pain and nausea.  Patient reports missed her last 2 HD sessions as she did not have any money for transportation.  In the ED, patient's BP was noted to be 220/100.  Patient was placed on BiPAP initially and weaned to nasal cannula.  Nephrology was consulted.  TRH consulted for further evaluation management.   Assessment & Plan:   Principal Problem:   Hypertensive urgency Active Problems:   ESRD (end stage renal disease) on dialysis (HCC)   Epigastric pain   Acute respiratory failure with hypoxia (HCC)   Chronic pancreatitis (HCC)   Acute respiratory failure with hypoxia, POA Volume overload Multifocal pneumonia Patient presenting to the ED with progressive shortness of breath in the setting of missed HD sessions.  Negative.  Patient was afebrile without leukocytosis.  For multifocal pulmonary infiltrates, possibly infectious right mid and lower lung zone and left lung base.  Initially required BiPAP which was transitioned to supplemental oxygen. --Continue HD as below --Azithromycin/ceftriaxone, plan 5-day course --Supplemental oxygen now weaned off, will obtain ambulatory O2 sats to ensure no further desaturation  ESRD on HD Patient dialyzes at [facility] clinic on Monday/Wednesday/Friday schedule.  Has missed 2 HD sessions recently due to lack of transportation/cost. --Nephrology following, appreciate assistance --Renal diet, fluid restrict 1200 mL/day --Next HD 6/8 --Strict I's and O's and  daily weights  Hypertensive urgency BP 220/110 on admission, likely secondary to volume overload in the setting of missed HD sessions outpatient. --Amlodipine 10 mg p.o. daily --Metoprolol succinate 12.5 mg p.o. daily --Hydralazine 50 mg p.o. q8h --Clonidine 0.1 mg p.o. twice daily --Continue monitor blood pressure closely and adjust as needed  Atypical chest pain Patient complained of substernal chest pain during the HD on 11/12/2020.  EKG with no dynamic changes.  TTE notable for anterior/anterior lateral hypokinesis with LVEF 40-45% with LV regional wall motion abnormalities, concentric LVH, grade 1 diastolic dysfunction.  Case was discussed with cardiology, Dr. Sharyn Lull who recommended outpatient follow-up in the office. --Continue monitor on telemetry  Acute on chronic pancreatitis Mildly elevated lipase on admission.  Now tolerating diet with improvement of abdominal pain. --Oxycodone as needed  Anemia of chronic renal disease Hemoglobin stable. --Transfuse for hemoglobin less than 7.0    DVT prophylaxis: heparin injection 5,000 Units Start: 11/10/20 0600    Code Status: Full Code Family Communication: No family present at bedside this morning  Disposition Plan:  Level of care: Progressive Status is: Inpatient  Remains inpatient appropriate because:Ongoing diagnostic testing needed not appropriate for outpatient work up, Unsafe d/c plan, IV treatments appropriate due to intensity of illness or inability to take PO and Inpatient level of care appropriate due to severity of illness   Dispo: The patient is from: Home              Anticipated d/c is to: Home              Patient currently is not medically stable to d/c.   Difficult to place patient No   Consultants:   Nephrology  Cardiology, case was discussed with Dr. Sharyn Lull by previous hospitalist  Procedures:   TTE  Antimicrobials:   Azithromycin 6/4>>  Ceftriaxone 6/5>>    Subjective: Patient seen and  examined at bedside, resting comfortably.  No specific complaints this morning.  Blood pressure not optimally controlled.  Nephrology starting metoprolol succinate today.  Now off of supplemental oxygen.  Next HD planned for tomorrow.  No other questions or concerns at this time.  Patient denies headache, no nausea/vomiting/diarrhea, no fever/chills/night sweats, no visual changes, no current chest pain, no palpitations, no current shortness of breath, no abdominal pain, no weakness, no fatigue, no paresthesias.  No acute events overnight per nurse staff.  Objective: Vitals:   11/12/20 1947 11/13/20 0148 11/13/20 0350 11/13/20 0852  BP: (!) 151/73 (!) 147/63 (!) 173/94 (!) 162/70  Pulse: 85 82 97 90  Resp: 17 18 18 18   Temp: 98.3 F (36.8 C) 98.4 F (36.9 C) 98.4 F (36.9 C) 98.7 F (37.1 C)  TempSrc: Oral Oral Oral Oral  SpO2: 92% 95% 94% 97%  Weight:      Height:       No intake or output data in the 24 hours ending 11/13/20 1600 Filed Weights   11/11/20 0500 11/12/20 0900 11/12/20 1245  Weight: 57.2 kg 57 kg 56.6 kg    Examination:  General exam: Appears calm and comfortable  Respiratory system: Clear to auscultation. Respiratory effort normal.  On room air at rest Cardiovascular system: S1 & S2 heard, RRR. No JVD, murmurs, rubs, gallops or clicks. No pedal edema. Gastrointestinal system: Abdomen is nondistended, soft and nontender. No organomegaly or masses felt. Normal bowel sounds heard. Central nervous system: Alert and oriented. No focal neurological deficits. Extremities: Symmetric 5 x 5 power. Skin: No rashes, lesions or ulcers Psychiatry: Judgement and insight appear normal. Mood & affect appropriate.     Data Reviewed: I have personally reviewed following labs and imaging studies  CBC: Recent Labs  Lab 11/09/20 2146 11/09/20 2214 11/09/20 2240 11/10/20 0229 11/12/20 1007 11/13/20 0319  WBC 9.5  --   --  8.3 6.3 6.3  NEUTROABS  --   --   --   --   --  3.6   HGB 8.8* 9.5* 9.5* 8.8* 8.5* 8.0*  HCT 28.8* 28.0* 28.0* 28.2* 27.1* 25.1*  MCV 90.6  --   --  87.0 87.7 87.2  PLT 180  --   --  185 177 167   Basic Metabolic Panel: Recent Labs  Lab 11/09/20 2146 11/09/20 2214 11/09/20 2240 11/10/20 0229 11/12/20 1007 11/13/20 0319  NA 143 143 142 142 134* 134*  K 4.3 4.1 4.2 4.8 4.4 4.2  CL 107  --  110 105 97* 95*  CO2 23  --   --  22 24 28   GLUCOSE 101*  --  98 121* 83 95  BUN 63*  --  57* 64* 52* 34*  CREATININE 14.16*  --  15.50* 14.40* 11.39* 6.05*  CALCIUM 9.1  --   --  9.2 8.3* 8.3*  PHOS  --   --   --   --  5.4*  --    GFR: Estimated Creatinine Clearance: 6.6 mL/min (A) (by C-G formula based on SCr of 6.05 mg/dL (H)). Liver Function Tests: Recent Labs  Lab 11/09/20 2146 11/11/20 1021 11/12/20 1007  AST 14* 15  --   ALT 12 11  --   ALKPHOS 55 48  --   BILITOT 0.6 0.5  --  PROT 6.6 6.4*  --   ALBUMIN 3.4* 2.9* 3.0*   Recent Labs  Lab 11/10/20 0229 11/11/20 1021  LIPASE 61* 53*   No results for input(s): AMMONIA in the last 168 hours. Coagulation Profile: No results for input(s): INR, PROTIME in the last 168 hours. Cardiac Enzymes: No results for input(s): CKTOTAL, CKMB, CKMBINDEX, TROPONINI in the last 168 hours. BNP (last 3 results) No results for input(s): PROBNP in the last 8760 hours. HbA1C: No results for input(s): HGBA1C in the last 72 hours. CBG: No results for input(s): GLUCAP in the last 168 hours. Lipid Profile: No results for input(s): CHOL, HDL, LDLCALC, TRIG, CHOLHDL, LDLDIRECT in the last 72 hours. Thyroid Function Tests: No results for input(s): TSH, T4TOTAL, FREET4, T3FREE, THYROIDAB in the last 72 hours. Anemia Panel: No results for input(s): VITAMINB12, FOLATE, FERRITIN, TIBC, IRON, RETICCTPCT in the last 72 hours. Sepsis Labs: No results for input(s): PROCALCITON, LATICACIDVEN in the last 168 hours.  Recent Results (from the past 240 hour(s))  Resp Panel by RT-PCR (Flu A&B, Covid)  Nasopharyngeal Swab     Status: None   Collection Time: 11/09/20 10:42 PM   Specimen: Nasopharyngeal Swab; Nasopharyngeal(NP) swabs in vial transport medium  Result Value Ref Range Status   SARS Coronavirus 2 by RT PCR NEGATIVE NEGATIVE Final    Comment: (NOTE) SARS-CoV-2 target nucleic acids are NOT DETECTED.  The SARS-CoV-2 RNA is generally detectable in upper respiratory specimens during the acute phase of infection. The lowest concentration of SARS-CoV-2 viral copies this assay can detect is 138 copies/mL. A negative result does not preclude SARS-Cov-2 infection and should not be used as the sole basis for treatment or other patient management decisions. A negative result may occur with  improper specimen collection/handling, submission of specimen other than nasopharyngeal swab, presence of viral mutation(s) within the areas targeted by this assay, and inadequate number of viral copies(<138 copies/mL). A negative result must be combined with clinical observations, patient history, and epidemiological information. The expected result is Negative.  Fact Sheet for Patients:  BloggerCourse.com  Fact Sheet for Healthcare Providers:  SeriousBroker.it  This test is no t yet approved or cleared by the [place] FDA and  has been authorized for detection and/or diagnosis of SARS-CoV-2 by FDA under an Emergency Use Authorization (EUA). This EUA will remain  in effect (meaning this test can be used) for the duration of the COVID-19 declaration under Section 564(b)(1) of the Act, 21 U.S.C.section 360bbb-3(b)(1), unless the authorization is terminated  or revoked sooner.       Influenza A by PCR NEGATIVE NEGATIVE Final   Influenza B by PCR NEGATIVE NEGATIVE Final    Comment: (NOTE) The Xpert Xpress SARS-CoV-2/FLU/RSV plus assay is intended as an aid in the diagnosis of influenza from Nasopharyngeal swab specimens and should not be  used as a sole basis for treatment. Nasal washings and aspirates are unacceptable for Xpert Xpress SARS-CoV-2/FLU/RSV testing.  Fact Sheet for Patients: BloggerCourse.com  Fact Sheet for Healthcare Providers: SeriousBroker.it  This test is not yet approved or cleared by the [place] FDA and has been authorized for detection and/or diagnosis of SARS-CoV-2 by FDA under an Emergency Use Authorization (EUA). This EUA will remain in effect (meaning this test can be used) for the duration of the COVID-19 declaration under Section 564(b)(1) of the Act, 21 U.S.C. section 360bbb-3(b)(1), unless the authorization is terminated or revoked.  Performed at [facility], [address] [place], [place] [address]  Radiology Studies: No results found.      Scheduled Meds: . amLODipine  10 mg Oral Daily  . atorvastatin  20 mg Oral QHS  . azithromycin  500 mg Oral Daily  . cinacalcet  30 mg Oral Q supper  . cloNIDine  0.1 mg Oral BID  . feeding supplement (NEPRO CARB STEADY)  237 mL Oral BID BM  . heparin  5,000 Units Subcutaneous Q8H  . hydrALAZINE  50 mg Oral Q8H  . metoprolol succinate  12.5 mg Oral Daily  . mirtazapine  15 mg Oral QHS  . multivitamin  1 tablet Oral QHS  . nicotine  7 mg Transdermal Daily  . pantoprazole  40 mg Oral Daily  . sevelamer carbonate  800 mg Oral TID with meals  . sodium chloride flush  3 mL Intravenous Q12H   Continuous Infusions: . sodium chloride    . cefTRIAXone (ROCEPHIN)  IV 1 g (11/13/20 0104)     LOS: 3 days    Time spent: 38 minutes spent on chart review, discussion with nursing staff, consultants, updating family and interview/physical exam; more than 50% of that time was spent in counseling and/or coordination of care.    Alvira Philips [place], DO [facility] Available via Epic secure chat 7am-7pm After these hours, please refer to coverage provider listed on  [email or web address] 11/13/2020, 4:00 PM

## 2020-11-14 LAB — BASIC METABOLIC PANEL
Anion gap: 15 (ref 5–15)
BUN: 59 mg/dL — ABNORMAL HIGH (ref 8–23)
CO2: 26 mmol/L (ref 22–32)
Calcium: 9.1 mg/dL (ref 8.9–10.3)
Chloride: 95 mmol/L — ABNORMAL LOW (ref 98–111)
Creatinine, Ser: 9.35 mg/dL — ABNORMAL HIGH (ref 0.44–1.00)
GFR, Estimated: 4 mL/min — ABNORMAL LOW (ref >60)
Glucose, Bld: 146 mg/dL — ABNORMAL HIGH (ref 70–99)
Potassium: 3.6 mmol/L (ref 3.5–5.1)
Sodium: 136 mmol/L (ref 135–145)

## 2020-11-14 LAB — CBC
HCT: 24.5 % — ABNORMAL LOW (ref 36.0–46.0)
Hemoglobin: 7.6 g/dL — ABNORMAL LOW (ref 12.0–15.0)
MCH: 27.4 pg (ref 26.0–34.0)
MCHC: 31 g/dL (ref 30.0–36.0)
MCV: 88.4 fL (ref 80.0–100.0)
Platelets: 202 10*3/uL (ref 150–400)
RBC: 2.77 MIL/uL — ABNORMAL LOW (ref 3.87–5.11)
RDW: 20.2 % — ABNORMAL HIGH (ref 11.5–15.5)
WBC: 7.3 10*3/uL (ref 4.0–10.5)
nRBC: 0 % (ref 0.0–0.2)

## 2020-11-14 MED ORDER — METOPROLOL SUCCINATE ER 25 MG PO TB24: 12.5000 mg | ORAL_TABLET | Freq: Every day | ORAL | 2 refills | Status: DC

## 2020-11-14 MED ORDER — HEPARIN SODIUM (PORCINE) 1000 UNIT/ML IJ SOLN
INTRAMUSCULAR | Status: AC
  Administered 2020-11-14: 2000 [IU] via INTRAVENOUS_CENTRAL
  Filled 2020-11-14: qty 3

## 2020-11-14 MED ORDER — SODIUM CHLORIDE 0.9 % IV SOLN: 100.0000 mL | INTRAVENOUS | Status: DC | PRN

## 2020-11-14 MED ORDER — LIDOCAINE HCL (PF) 1 % IJ SOLN: 5.0000 mL | INTRAMUSCULAR | Status: DC | PRN

## 2020-11-14 MED ORDER — PANTOPRAZOLE SODIUM 40 MG PO TBEC: 40.0000 mg | DELAYED_RELEASE_TABLET | Freq: Every day | ORAL | 2 refills | Status: DC

## 2020-11-14 MED ORDER — HYDRALAZINE HCL 50 MG PO TABS
75.0000 mg | ORAL_TABLET | Freq: Three times a day (TID) | ORAL | Status: DC
  Administered 2020-11-14 – 2020-11-15 (×3): 75 mg via ORAL
  Filled 2020-11-14 (×3): qty 1

## 2020-11-14 MED ORDER — HYDRALAZINE HCL 25 MG PO TABS: 75.0000 mg | ORAL_TABLET | Freq: Three times a day (TID) | ORAL | 2 refills | Status: AC

## 2020-11-14 MED ORDER — DARBEPOETIN ALFA 100 MCG/0.5ML IJ SOSY
100.0000 ug | PREFILLED_SYRINGE | INTRAMUSCULAR | Status: DC
  Filled 2020-11-14: qty 0.5

## 2020-11-14 MED ORDER — LIDOCAINE-PRILOCAINE 2.5-2.5 % EX CREA: 1.0000 "application " | TOPICAL_CREAM | CUTANEOUS | Status: DC | PRN

## 2020-11-14 MED ORDER — HEPARIN SODIUM (PORCINE) 1000 UNIT/ML DIALYSIS
2000.0000 [IU] | Freq: Once | INTRAMUSCULAR | Status: AC
Start: 2020-11-14 — End: 2020-11-14

## 2020-11-14 MED ORDER — PENTAFLUOROPROP-TETRAFLUOROETH EX AERO: 1.0000 "application " | INHALATION_SPRAY | CUTANEOUS | Status: DC | PRN

## 2020-11-14 NOTE — Care Management Important Message (Signed)
Important Message  Patient Details  Name: Ashley [name] MRN: [id number] Date of Birth: 1950-11-08   Medicare Important Message Given:  Yes     Orbie Pyo 11/14/2020, 1:36 PM

## 2020-11-14 NOTE — Progress Notes (Signed)
Patient returned from HD complaining of a "splitting headache." Gave oxycodone for headache, at 1727. 1705 BP 163/81 and patient given medications due and am meds held before dialysis. 1814 BP 149/66. Patient stated she felt better, her headache was "easing off". Stated she was concerned about heart rate that had been high during dialysis. Stated she was given a medication that would lower her heart rate. Patient had a few bits of her dinner and then vomited it into emesis bag. Stated she felt better and did not need Zofran. Patient unsure if she felt well enough to go home. Notified Dr. [place] and orders to hold discharge were given. Patient notified and verbalized feeling better now that she was staying. She will call her daughter and let her know she will not be discharged today.

## 2020-11-14 NOTE — Discharge Summary (Addendum)
Physician Discharge Summary  Ashley [name]:[id number] DOB: 01/29/1951 DOA: 11/09/2020  PCP: Raymon Mutton., FNP  Admit date: 11/09/2020 Discharge date: 11/15/2020  Admitted From: Home Disposition: Home  Recommendations for Outpatient Follow-up:  Follow up with PCP in 1-2 weeks Follow-up with cardiology, Dr. Sharyn Lull in 2 weeks Follow-up with nephrology as scheduled Started on metoprolol succinate 12.5 mg p.o. daily and hydralazine 75 mg p.o. every 8 hours Continue monitor BP and adjust antihypertensives as needed  Home Health: No Equipment/Devices: None  Discharge Condition: Stable CODE STATUS: Full code Diet recommendation: Heart healthy diet  History of present illness:  Ashley [name] is a 70 year old female with past medical history significant for pancreatitis, ESRD on HD M WF, hepatitis C, hyperlipidemia, essential hypertension, history of CVA, history of gastric ulcer/gastropathy who presented to [facility], ED on 11/09/2020 with progressive shortness of breath.  Patient symptoms present for the last 1-2 days associate with abdominal pain and nausea.  Patient reports missed her last 2 HD sessions as she did not have any money for transportation.   In the ED, patient's BP was noted to be 220/100.  Patient was placed on BiPAP initially and weaned to nasal cannula.  Nephrology was consulted.  TRH consulted for further evaluation management.  Hospital course:  Acute respiratory failure with hypoxia, POA Volume overload Multifocal pneumonia Patient presenting to the ED with progressive shortness of breath in the setting of missed HD sessions.  Negative.  Patient was afebrile without leukocytosis.  For multifocal pulmonary infiltrates, possibly infectious right mid and lower lung zone and left lung base.  Initially required BiPAP which was transitioned to supplemental oxygen.  Nephrology was consulted and patient was started on hemodialysis.  Patient completed 5-day course of  azithromycin and ceftriaxone.  Patient was weaned off of supple oxygen.   ESRD on HD Patient dialyzes at [facility] clinic on Monday/Wednesday/Friday schedule.  Has missed 2 HD sessions recently due to lack of transportation/cost.  Neurology was consulted and followed during hospital course.  Patient was maintained on HD during hospitalization with improvement of shortness of breath and able to be weaned off of supple oxygen.   Hypertensive urgency BP 220/110 on admission, likely secondary to volume overload in the setting of missed HD sessions outpatient. Amlodipine 10 mg p.o. daily, Metoprolol succinate 12.5 mg p.o. daily, Hydralazine 75 mg p.o. q8h, Clonidine 0.1 mg p.o. twice daily.    Atypical chest pain Patient complained of substernal chest pain during the HD on 11/12/2020.  EKG with no dynamic changes.  TTE notable for anterior/anterior lateral hypokinesis with LVEF 40-45% with LV regional wall motion abnormalities, concentric LVH, grade 1 diastolic dysfunction. Case was discussed with cardiology, Dr. Sharyn Lull who recommended outpatient follow-up in the office.   Acute on chronic pancreatitis Mildly elevated lipase on admission.  Now tolerating diet with improvement of abdominal pain.   Anemia of chronic renal disease Hemoglobin stable.  Discharge Diagnoses:  Active Problems:   ESRD (end stage renal disease) on dialysis (HCC)   Chronic pancreatitis [facility])    Discharge Instructions  Discharge Instructions     Call MD for:  difficulty breathing, headache or visual disturbances   Complete by: As directed    Call MD for:  extreme fatigue   Complete by: As directed    Call MD for:  persistant dizziness or light-headedness   Complete by: As directed    Call MD for:  persistant nausea and vomiting   Complete by: As directed  Call MD for:  severe uncontrolled pain   Complete by: As directed    Call MD for:  temperature >100.4   Complete by: As directed    Diet - low sodium heart  healthy   Complete by: As directed    Increase activity slowly   Complete by: As directed    Increase activity slowly   Complete by: As directed    No wound care   Complete by: As directed       Allergies as of 11/15/2020       Reactions   Aspirin Nausea And Vomiting   Stomach ache   Ibuprofen Nausea And Vomiting   Stomach ache        Medication List     TAKE these medications    acetaminophen 325 MG tablet Commonly known as: TYLENOL Take 2 tablets (650 mg total) by mouth every 6 (six) hours as needed for mild pain (or Fever >/= 101).   amLODipine 10 MG tablet Commonly known as: NORVASC Take 1 tablet (10 mg total) by mouth daily. What changed: when to take this   atorvastatin 20 MG tablet Commonly known as: LIPITOR Take 20 mg by mouth at bedtime.   cinacalcet 30 MG tablet Commonly known as: SENSIPAR Take 30 mg by mouth at bedtime.   cloNIDine 0.1 MG tablet Commonly known as: CATAPRES Take 0.1 mg by mouth 2 (two) times daily.   DIALYVITE 800 WITH ZINC 0.8 MG Tabs Take 1 tablet by mouth daily.   feeding supplement (NEPRO CARB STEADY) Liqd Take 237 mLs by mouth 3 (three) times daily as needed (Supplement).   ferric citrate 1 GM 210 MG(Fe) tablet Commonly known as: AURYXIA Take 1 tablet (210 mg total) by mouth 3 (three) times daily with meals. What changed: how much to take   hydrALAZINE 25 MG tablet Commonly known as: APRESOLINE Take 3 tablets (75 mg total) by mouth every 8 (eight) hours.   lidocaine-prilocaine cream Commonly known as: EMLA Apply 1 application topically See admin instructions. Apply small amount to access site on Tuesday, Thursday, Saturday one hour before dialysis. Cover with occlusive dressing (saran wrap)   metoprolol succinate 25 MG 24 hr tablet Commonly known as: TOPROL-XL Take 0.5 tablets (12.5 mg total) by mouth daily.   MIRCERA IJ Administered at dialysis   mirtazapine 15 MG tablet Commonly known as: REMERON Take 15 mg by  mouth at bedtime.   pantoprazole 40 MG tablet Commonly known as: PROTONIX Take 1 tablet (40 mg total) by mouth daily. What changed: when to take this   polyethylene glycol 17 g packet Commonly known as: MiraLax Take 17 g by mouth 2 (two) times daily. What changed:  when to take this reasons to take this   sevelamer carbonate 800 MG tablet Commonly known as: RENVELA Take 800 mg by mouth 3 (three) times daily.        Follow-up Information     Raymon Mutton., FNP. Schedule an appointment as soon as possible for a visit in 1 week(s).   Specialty: Family Medicine Contact information: [address] [place] [address] [phone]         Rinaldo Cloud, MD. Schedule an appointment as soon as possible for a visit in 2 week(s).   Specialty: Cardiology Contact information: [address] [place] [address] [phone]                Allergies  Allergen Reactions   Aspirin Nausea And  Vomiting    Stomach ache   Ibuprofen Nausea And Vomiting    Stomach ache    Consultations: Nephrology Cardiology, case was discussed with Dr. Sharyn Lull by previous hospitalist   Procedures/Studies: DG Chest 2 View  Result Date: 11/10/2020 CLINICAL DATA:  Shortness of breath, follow-up infiltrates EXAM: CHEST - 2 VIEW COMPARISON:  11/09/2020 FINDINGS: Enlargement of cardiac silhouette. Mediastinal contours and pulmonary vascularity normal. Atherosclerotic calcification aorta. Hazy infiltrates in both lungs question multifocal pneumonia. Small bibasilar pleural effusions on lateral view. No pneumothorax. Vascular stent LEFT upper extremity. Bones demineralized. IMPRESSION: Persistent patchy infiltrates consistent with multifocal pneumonia. Small bibasilar pleural effusions. Aortic Atherosclerosis (ICD10-I70.0). Electronically Signed   By: Ulyses Southward M.D.   On: 11/10/2020 13:50   DG Chest Portable 1 View  Result Date: 11/09/2020 CLINICAL DATA:  Dyspnea EXAM:  PORTABLE CHEST 1 VIEW COMPARISON:  01/07/2020 FINDINGS: Patchy multifocal pulmonary infiltrates are noted within the right mid and lower lung zone and left lung base, possibly infectious or inflammatory in the acute setting. No pneumothorax or pleural effusion. Cardiac size is within normal limits. Pulmonary vascularity is normal. No acute bone abnormality. IMPRESSION: Multifocal pulmonary infiltrates, possibly infectious or inflammatory in the acute setting. Electronically Signed   By: Helyn Numbers MD   On: 11/09/2020 22:13   ECHOCARDIOGRAM COMPLETE  Result Date: 11/10/2020    ECHOCARDIOGRAM REPORT   Patient Name:   Ashley [name] Date of Exam: 11/10/2020 Medical Rec #:  [id number]     Height:       62.0 in Accession #:    [id number]    Weight:       120.1 lb Date of Birth:  06/13/1950     BSA:          1.539 m Patient Age:    70 years      BP:           170/92 mmHg Patient Gender: F             HR:           71 bpm. Exam Location:  Inpatient Procedure: 2D Echo, Cardiac Doppler and Color Doppler Indications:    Chest Pain R07.9  History:        Patient has prior history of Echocardiogram examinations, most                 recent 06/14/2017. Stroke; Risk Factors:Hypertension and                 Dyslipidemia. Hepatitis C. Polysubstance Abuse, Hx of tobacco                 abuse.  Sonographer:    Celesta Gentile RCS Referring Phys: Herminio Heads IMPRESSIONS  1. Anterior and anterolateral hypokinesis. Left ventricular ejection fraction, by estimation, is 40 to 45%. The left ventricle has mildly decreased function. The left ventricle demonstrates regional wall motion abnormalities (see scoring diagram/findings for description). There is moderate concentric left ventricular hypertrophy. Left ventricular diastolic parameters are consistent with Grade I diastolic dysfunction (impaired relaxation).  2. Right ventricular systolic function is normal. The right ventricular size is normal. There is mildly elevated pulmonary  artery systolic pressure.  3. Left atrial size was severely dilated.  4. Right atrial size was severely dilated.  5. The mitral valve is normal in structure. Mild mitral valve regurgitation. No evidence of mitral stenosis.  6. The aortic valve is tricuspid. Aortic valve regurgitation is not visualized. No aortic stenosis  is present.  7. The inferior vena cava is normal in size with greater than 50% respiratory variability, suggesting right atrial pressure of 3 mmHg. FINDINGS  Left Ventricle: Anterior and anterolateral hypokinesis. Left ventricular ejection fraction, by estimation, is 40 to 45%. The left ventricle has mildly decreased function. The left ventricle demonstrates regional wall motion abnormalities. The left ventricular internal cavity size was normal in size. There is moderate concentric left ventricular hypertrophy. Left ventricular diastolic parameters are consistent with Grade I diastolic dysfunction (impaired relaxation). Right Ventricle: The right ventricular size is normal. No increase in right ventricular wall thickness. Right ventricular systolic function is normal. There is mildly elevated pulmonary artery systolic pressure. The tricuspid regurgitant velocity is 3.16  m/s, and with an assumed right atrial pressure of 3 mmHg, the estimated right ventricular systolic pressure is 42.9 mmHg. Left Atrium: Left atrial size was severely dilated. Right Atrium: Right atrial size was severely dilated. Pericardium: Trivial pericardial effusion is present. Mitral Valve: The mitral valve is normal in structure. Mild mitral annular calcification. Mild mitral valve regurgitation. No evidence of mitral valve stenosis. MV peak gradient, 135.5 mmHg. The mean mitral valve gradient is 92.0 mmHg. Tricuspid Valve: The tricuspid valve is normal in structure. Tricuspid valve regurgitation is mild . No evidence of tricuspid stenosis. Aortic Valve: The aortic valve is tricuspid. Aortic valve regurgitation is not  visualized. No aortic stenosis is present. Aortic valve mean gradient measures 6.0 mmHg. Aortic valve peak gradient measures 12.5 mmHg. Aortic valve area, by VTI measures 1.51  cm. Pulmonic Valve: The pulmonic valve was normal in structure. Pulmonic valve regurgitation is not visualized. No evidence of pulmonic stenosis. Aorta: The aortic root is normal in size and structure. Venous: The inferior vena cava is normal in size with greater than 50% respiratory variability, suggesting right atrial pressure of 3 mmHg. IAS/Shunts: No atrial level shunt detected by color flow Doppler.  LEFT VENTRICLE PLAX 2D LVIDd:         5.00 cm  Diastology LVIDs:         3.30 cm  LV e' medial:    4.68 cm/s LV PW:         1.40 cm  LV E/e' medial:  22.0 LV IVS:        1.40 cm  LV e' lateral:   3.48 cm/s LVOT diam:     1.90 cm  LV E/e' lateral: 29.6 LV SV:         65 LV SV Index:   42 LVOT Area:     2.84 cm  RIGHT VENTRICLE TAPSE (M-mode): 2.2 cm LEFT ATRIUM              Index       RIGHT ATRIUM           Index LA diam:        4.20 cm  2.73 cm/m  RA Area:     21.00 cm LA Vol (A2C):   123.0 ml 79.90 ml/m RA Volume:   65.20 ml  42.35 ml/m LA Vol (A4C):   85.3 ml  55.41 ml/m LA Biplane Vol: 103.0 ml 66.91 ml/m  AORTIC VALVE AV Area (Vmax):    1.65 cm AV Area (Vmean):   1.78 cm AV Area (VTI):     1.51 cm AV Vmax:           177.00 cm/s AV Vmean:          116.000 cm/s AV VTI:  0.431 m AV Peak Grad:      12.5 mmHg AV Mean Grad:      6.0 mmHg LVOT Vmax:         103.00 cm/s LVOT Vmean:        73.000 cm/s LVOT VTI:          0.230 m LVOT/AV VTI ratio: 0.53  AORTA Ao Root diam: 3.10 cm MITRAL VALVE                 TRICUSPID VALVE MV Area (PHT): 4.06 cm      TR Peak grad:   39.9 mmHg MV Area VTI:   0.32 cm      TR Vmax:        316.00 cm/s MV Peak grad:  135.5 mmHg MV Mean grad:  92.0 mmHg     SHUNTS MV Vmax:       5.82 m/s      Systemic VTI:  0.23 m MV Vmean:      454.0 cm/s    Systemic Diam: 1.90 cm MV Decel Time: 187 msec MR  Peak grad:    139.7 mmHg MR Mean grad:    98.0 mmHg MR Vmax:         591.00 cm/s MR Vmean:        476.0 cm/s MR PISA:         2.26 cm MR PISA Eff ROA: 9 mm MR PISA Radius:  0.60 cm MV E velocity: 103.00 cm/s MV A velocity: 131.00 cm/s MV E/A ratio:  0.79 Chilton Si MD Electronically signed by Chilton Si MD Signature Date/Time: 11/10/2020/3:22:02 PM    Final       Subjective: Patient seen examined bedside, resting comfortably.  Patient felt poorly after HD yesterday, so discharge was held.  Now feels back to her normal baseline.  Tolerated diet this morning.  Heart rate and blood pressure better today.  No other complaints or concerns at this time.  Feels ready for discharge today.  Denies headache, no dizziness, no fever/chills/night sweats, no nausea/vomiting/diarrhea, no chest pain, no palpitations, no shortness of breath, no abdominal pain, no weakness, no fatigue, no paresthesias.  No acute events overnight per nursing staff.  Discharge Exam: Vitals:   11/15/20 0453 11/15/20 0750  BP: (!) 148/75 (!) 142/70  Pulse: 99 90  Resp: 17 17  Temp: 98.3 F (36.8 C) 98.3 F (36.8 C)  SpO2: 95% 91%   Vitals:   11/14/20 2105 11/14/20 2352 11/15/20 0453 11/15/20 0750  BP: 132/67 134/62 (!) 148/75 (!) 142/70  Pulse: 99 97 99 90  Resp: 19 19 17 17   Temp: 98.4 F (36.9 C)  98.3 F (36.8 C) 98.3 F (36.8 C)  TempSrc: Oral  Oral Oral  SpO2: 98% 97% 95% 91%  Weight:      Height:        General: Pt is alert, awake, not in acute distress Cardiovascular: RRR, S1/S2 +, no rubs, no gallops Respiratory: CTA bilaterally, no wheezing, no rhonchi, on room air Abdominal: Soft, NT, ND, bowel sounds + Extremities: no edema, no cyanosis    The results of significant diagnostics from this hospitalization (including imaging, microbiology, ancillary and laboratory) are listed below for reference.     Microbiology: Recent Results (from the past 240 hour(s))  Resp Panel by RT-PCR (Flu A&B,  Covid) Nasopharyngeal Swab     Status: None   Collection Time: 11/09/20 10:42 PM   Specimen: Nasopharyngeal Swab; Nasopharyngeal(NP) swabs in vial transport medium  Result Value Ref Range Status   SARS Coronavirus 2 by RT PCR NEGATIVE NEGATIVE Final    Comment: (NOTE) SARS-CoV-2 target nucleic acids are NOT DETECTED.  The SARS-CoV-2 RNA is generally detectable in upper respiratory specimens during the acute phase of infection. The lowest concentration of SARS-CoV-2 viral copies this assay can detect is 138 copies/mL. A negative result does not preclude SARS-Cov-2 infection and should not be used as the sole basis for treatment or other patient management decisions. A negative result may occur with  improper specimen collection/handling, submission of specimen other than nasopharyngeal swab, presence of viral mutation(s) within the areas targeted by this assay, and inadequate number of viral copies(<138 copies/mL). A negative result must be combined with clinical observations, patient history, and epidemiological information. The expected result is Negative.  Fact Sheet for Patients:  BloggerCourse.com  Fact Sheet for Healthcare Providers:  SeriousBroker.it  This test is no t yet approved or cleared by the [place] FDA and  has been authorized for detection and/or diagnosis of SARS-CoV-2 by FDA under an Emergency Use Authorization (EUA). This EUA will remain  in effect (meaning this test can be used) for the duration of the COVID-19 declaration under Section 564(b)(1) of the Act, 21 U.S.C.section 360bbb-3(b)(1), unless the authorization is terminated  or revoked sooner.       Influenza A by PCR NEGATIVE NEGATIVE Final   Influenza B by PCR NEGATIVE NEGATIVE Final    Comment: (NOTE) The Xpert Xpress SARS-CoV-2/FLU/RSV plus assay is intended as an aid in the diagnosis of influenza from Nasopharyngeal swab specimens and should  not be used as a sole basis for treatment. Nasal washings and aspirates are unacceptable for Xpert Xpress SARS-CoV-2/FLU/RSV testing.  Fact Sheet for Patients: BloggerCourse.com  Fact Sheet for Healthcare Providers: SeriousBroker.it  This test is not yet approved or cleared by the [place] FDA and has been authorized for detection and/or diagnosis of SARS-CoV-2 by FDA under an Emergency Use Authorization (EUA). This EUA will remain in effect (meaning this test can be used) for the duration of the COVID-19 declaration under Section 564(b)(1) of the Act, 21 U.S.C. section 360bbb-3(b)(1), unless the authorization is terminated or revoked.  Performed at [facility], [address], [place], [place] [address]      Labs: BNP (last 3 results) No results for input(s): BNP in the last 8760 hours. Basic Metabolic Panel: Recent Labs  Lab 11/10/20 0229 11/12/20 1007 11/13/20 0319 11/14/20 1326 11/15/20 0329  NA 142 134* 134* 136 136  K 4.8 4.4 4.2 3.6 3.8  CL 105 97* 95* 95* 96*  CO2 22 24 28 26 26   GLUCOSE 121* 83 95 146* 103*  BUN 64* 52* 34* 59* 20  CREATININE 14.40* 11.39* 6.05* 9.35* 4.78*  CALCIUM 9.2 8.3* 8.3* 9.1 8.5*  PHOS  --  5.4*  --   --   --    Liver Function Tests: Recent Labs  Lab 11/09/20 2146 11/11/20 1021 11/12/20 1007  AST 14* 15  --   ALT 12 11  --   ALKPHOS 55 48  --   BILITOT 0.6 0.5  --   PROT 6.6 6.4*  --   ALBUMIN 3.4* 2.9* 3.0*   Recent Labs  Lab 11/10/20 0229 11/11/20 1021  LIPASE 61* 53*   No results for input(s): AMMONIA in the last 168 hours. CBC: Recent Labs  Lab 11/10/20 0229 11/12/20 1007 11/13/20 0319 11/14/20 1326 11/15/20 0329  WBC 8.3 6.3 6.3 7.3 6.9  NEUTROABS  --   --  3.6  --   --   HGB 8.8* 8.5* 8.0* 7.6* 8.1*  HCT 28.2* 27.1* 25.1* 24.5* 25.4*  MCV 87.0 87.7 87.2 88.4 87.0  PLT 185 177 167 202 214   Cardiac Enzymes: No results for input(s):  CKTOTAL, CKMB, CKMBINDEX, TROPONINI in the last 168 hours. BNP: Invalid input(s): POCBNP CBG: No results for input(s): GLUCAP in the last 168 hours. D-Dimer No results for input(s): DDIMER in the last 72 hours. Hgb A1c No results for input(s): HGBA1C in the last 72 hours. Lipid Profile No results for input(s): CHOL, HDL, LDLCALC, TRIG, CHOLHDL, LDLDIRECT in the last 72 hours. Thyroid function studies No results for input(s): TSH, T4TOTAL, T3FREE, THYROIDAB in the last 72 hours.  Invalid input(s): FREET3 Anemia work up No results for input(s): VITAMINB12, FOLATE, FERRITIN, TIBC, IRON, RETICCTPCT in the last 72 hours. Urinalysis    Component Value Date/Time   COLORURINE YELLOW 12/31/2018 1014   APPEARANCEUR CLEAR 12/31/2018 1014   LABSPEC 1.008 12/31/2018 1014   PHURINE 9.0 (H) 12/31/2018 1014   GLUCOSEU NEGATIVE 12/31/2018 1014   HGBUR SMALL (A) 12/31/2018 1014   BILIRUBINUR NEGATIVE 12/31/2018 1014   BILIRUBINUR NEG 01/29/2016 1142   KETONESUR NEGATIVE 12/31/2018 1014   PROTEINUR >=300 (A) 12/31/2018 1014   UROBILINOGEN 0.2 01/29/2016 1142   UROBILINOGEN 0.2 03/21/2015 1649   NITRITE NEGATIVE 12/31/2018 1014   LEUKOCYTESUR NEGATIVE 12/31/2018 1014   Sepsis Labs Invalid input(s): PROCALCITONIN,  WBC,  LACTICIDVEN Microbiology Recent Results (from the past 240 hour(s))  Resp Panel by RT-PCR (Flu A&B, Covid) Nasopharyngeal Swab     Status: None   Collection Time: 11/09/20 10:42 PM   Specimen: Nasopharyngeal Swab; Nasopharyngeal(NP) swabs in vial transport medium  Result Value Ref Range Status   SARS Coronavirus 2 by RT PCR NEGATIVE NEGATIVE Final    Comment: (NOTE) SARS-CoV-2 target nucleic acids are NOT DETECTED.  The SARS-CoV-2 RNA is generally detectable in upper respiratory specimens during the acute phase of infection. The lowest concentration of SARS-CoV-2 viral copies this assay can detect is 138 copies/mL. A negative result does not preclude  SARS-Cov-2 infection and should not be used as the sole basis for treatment or other patient management decisions. A negative result may occur with  improper specimen collection/handling, submission of specimen other than nasopharyngeal swab, presence of viral mutation(s) within the areas targeted by this assay, and inadequate number of viral copies(<138 copies/mL). A negative result must be combined with clinical observations, patient history, and epidemiological information. The expected result is Negative.  Fact Sheet for Patients:  BloggerCourse.com  Fact Sheet for Healthcare Providers:  SeriousBroker.it  This test is no t yet approved or cleared by the [place] FDA and  has been authorized for detection and/or diagnosis of SARS-CoV-2 by FDA under an Emergency Use Authorization (EUA). This EUA will remain  in effect (meaning this test can be used) for the duration of the COVID-19 declaration under Section 564(b)(1) of the Act, 21 U.S.C.section 360bbb-3(b)(1), unless the authorization is terminated  or revoked sooner.       Influenza A by PCR NEGATIVE NEGATIVE Final   Influenza B by PCR NEGATIVE NEGATIVE Final    Comment: (NOTE) The Xpert Xpress SARS-CoV-2/FLU/RSV plus assay is intended as an aid in the diagnosis of influenza from Nasopharyngeal swab specimens and should not be used as a sole basis for treatment. Nasal washings and aspirates are unacceptable for Xpert Xpress SARS-CoV-2/FLU/RSV testing.  Fact Sheet for Patients:  BloggerCourse.com  Fact Sheet for Healthcare Providers: SeriousBroker.it  This test is not yet approved or cleared by the [place] FDA and has been authorized for detection and/or diagnosis of SARS-CoV-2 by FDA under an Emergency Use Authorization (EUA). This EUA will remain in effect (meaning this test can be used) for the duration of  the COVID-19 declaration under Section 564(b)(1) of the Act, 21 U.S.C. section 360bbb-3(b)(1), unless the authorization is terminated or revoked.  Performed at [facility], [address], [place], [place] [address]      Time coordinating discharge: Over 30 minutes  SIGNED:   Alvira Philips [place], DO  [facility] 11/15/2020, 11:12 AM

## 2020-11-14 NOTE — Progress Notes (Addendum)
[facility] Progress Note   Subjective: Seen in room. No C/Os. BP improved after starting metoprolol succinate. HD today on schedule.   Objective Vitals:   11/14/20 0427 11/14/20 0447 11/14/20 0700 11/14/20 0816  BP: (!) 151/65 (!) 174/78 (!) 153/67 (!) 153/67  Pulse: 83  85   Resp: 19  17   Temp: 98.3 F (36.8 C)  97.9 F (36.6 C)   TempSrc: Oral  Oral   SpO2: 100%  100%   Weight:      Height:       Physical Exam General:Pleasant elderly female in NAD Heart:S1,S2 RRR SR on monitor. Lungs:CTAB Abdomen:Soft, NT Extremities:no LE edema Dialysis Access:L AVF + T/B   Additional Objective Labs: Basic Metabolic Panel: Recent Labs  Lab 11/10/20 0229 11/12/20 1007 11/13/20 0319  NA 142 134* 134*  K 4.8 4.4 4.2  CL 105 97* 95*  CO2 '22 24 28  '$ GLUCOSE 121* 83 95  BUN 64* 52* 34*  CREATININE 14.40* 11.39* 6.05*  CALCIUM 9.2 8.3* 8.3*  PHOS  --  5.4*  --    Liver Function Tests: Recent Labs  Lab 11/09/20 2146 11/11/20 1021 11/12/20 1007  AST 14* 15  --   ALT 12 11  --   ALKPHOS 55 48  --   BILITOT 0.6 0.5  --   PROT 6.6 6.4*  --   ALBUMIN 3.4* 2.9* 3.0*   Recent Labs  Lab 11/10/20 0229 11/11/20 1021  LIPASE 61* 53*   CBC: Recent Labs  Lab 11/09/20 2146 11/09/20 2214 11/10/20 0229 11/12/20 1007 11/13/20 0319  WBC 9.5  --  8.3 6.3 6.3  NEUTROABS  --   --   --   --  3.6  HGB 8.8*   < > 8.8* 8.5* 8.0*  HCT 28.8*   < > 28.2* 27.1* 25.1*  MCV 90.6  --  87.0 87.7 87.2  PLT 180  --  185 177 167   < > = values in this interval not displayed.   Blood Culture    Component Value Date/Time   SDES URINE, RANDOM 06/20/2017 2222   SPECREQUEST NONE 06/20/2017 2222   CULT MULTIPLE SPECIES PRESENT, SUGGEST RECOLLECTION (A) 06/20/2017 2222   REPTSTATUS 06/22/2017 FINAL 06/20/2017 2222    Cardiac Enzymes: No results for input(s): CKTOTAL, CKMB, CKMBINDEX, TROPONINI in the last 168 hours. CBG: No results for input(s): GLUCAP in the last  168 hours. Iron Studies: No results for input(s): IRON, TIBC, TRANSFERRIN, FERRITIN in the last 72 hours. '@lablastinr3'$ @ Studies/Results: No results found. Medications: . sodium chloride    . cefTRIAXone (ROCEPHIN)  IV 1 g (11/14/20 0110)   . amLODipine  10 mg Oral Daily  . atorvastatin  20 mg Oral QHS  . azithromycin  500 mg Oral Daily  . cinacalcet  30 mg Oral Q supper  . cloNIDine  0.1 mg Oral BID  . feeding supplement (NEPRO CARB STEADY)  237 mL Oral BID BM  . heparin  5,000 Units Subcutaneous Q8H  . hydrALAZINE  75 mg Oral Q8H  . metoprolol succinate  12.5 mg Oral Daily  . mirtazapine  15 mg Oral QHS  . multivitamin  1 tablet Oral QHS  . nicotine  7 mg Transdermal Daily  . pantoprazole  40 mg Oral Daily  . sevelamer carbonate  800 mg Oral TID with meals  . sodium chloride flush  3 mL Intravenous Q12H     Dialysis Orders: [place] MWF 3.5h 350/500 57kg 2/2 bath P2  -  Heparin2000 units initial bolus+ 1000Units IVmidrun - hect 4 ug tiw - venofer '100mg'$  IVx5 (2 left) - mircera 61mg IVq2 last 5/25 - home renal: auryxia 2 tid/ norvasc 10/ sensipar 30 hs/ clonidine 0.1 mg bid/ renvela 800 tid  Assessment/Plan: 1.HTN crisis - BP improved after adding Metoprolol succinate 12.5 mg PO q day. Continue lowering volume with HD as tolerated.  2. PNA - multifocal PNA on CXR post HD 6/4 - IV antibiotics per primary.  3.ESRD - HD MWF.Next HD 11/14/20 4. Chest pain - on HD. EKG neg, resolved  5.Volume-Euvolemic appearing today. Well below dry weight.HD 06/06 Net UF 1 liter. Post wt 56.6, Will need lower EDW at discharge.  6. 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 Nutrition -2.9 change to renal diet with fluid restrictions. Add nepro.  Rita H. Brown NP-C 11/14/2020, 8:54 AM  [facility] [phone] Nephrology attending: Patient was seen and examined at bedside.  Chart reviewed.  I agree with  assessment plan as outlined above.  70year old female ESRD on HD admitted with hypertensive urgency.  Blood pressure significantly improved with ultrafiltration and antihypertensive medications.  On antibiotics for pneumonia.  She is clinically improving.  Plan for regular dialysis today. Ok to discharge from renal perspective.  DKatheran James MD [facility].

## 2020-11-14 NOTE — Progress Notes (Addendum)
Physical Therapy Treatment Patient Details Name: Ashley [name] MRN: [id number] DOB: 1951-02-25 Today's Date: 11/14/2020    History of Present Illness Pt is a 70 y.o. female admitted 11/09/20 with SOB, abdominal pain. Workup for acute hypoxic respiratory failure secondary to volume overload, multifocal PNA, HTN. PMH includes ESRD on HD, HTN, pancreatitis, CVA (2019).   PT Comments    Pt progressing with mobility; demonstrates significant improvement in stability and activity tolerance. Pt preparing for d/c home today after HD session. Educ re: activity recommendations, fall risk reduction, importance of continued mobility. Pt reports no further questions or concerns. If to remain admitted, will continue to follow acutely.  Resting BP 152/70, HR 86 Post-mobility BP 181/78, HR 110s    Follow Up Recommendations  No PT follow up     Equipment Recommendations  None recommended by PT    Recommendations for Other Services       Precautions / Restrictions Precautions Precautions: Fall;Other (comment) Precaution Comments: HTN Restrictions Weight Bearing Restrictions: No    Mobility  Bed Mobility Overal bed mobility: Independent                  Transfers Overall transfer level: Modified independent Equipment used: None Transfers: Sit to/from Stand           General transfer comment: multiple sit<>stands from EOB and toilet, use of grab bar to pull to standing; mod indep  Ambulation/Gait Ambulation/Gait assistance: Supervision Gait Distance (Feet): 300 Feet Assistive device: None Gait Pattern/deviations: Step-through pattern;Decreased stride length Gait velocity: Decreased   General Gait Details: Slow, steady gait without DME; stability and activity tolerance improved from yesterday's session; no significant DOE noted with ambulation   Chief Strategy Officer    Modified Rankin (Stroke Patients Only)       Balance Overall balance  assessment: Needs assistance   Sitting balance-Leahy Scale: Good       Standing balance-Leahy Scale: Good Standing balance comment: Able to perform static and dynamic standing tasks performing ADLs at sink without UE suppor             High level balance activites: Side stepping;Direction changes;Turns;Sudden stops;Head turns High Level Balance Comments: No overt instability or LOB with these higher level balance tasks            Cognition Arousal/Alertness: Awake/alert Behavior During Therapy: WFL for tasks assessed/performed Overall Cognitive Status: Within Functional Limits for tasks assessed                                        Exercises      General Comments General comments (skin integrity, edema, etc.): Resting BP 152/70, HR 86; post-mobility BP 181/78, HR 110s      Pertinent Vitals/Pain Pain Assessment: No/denies pain    Home Living                      Prior Function            PT Goals (current goals can now be found in the care plan section) Progress towards PT goals: Progressing toward goals    Frequency    Min 3X/week      PT Plan Current plan remains appropriate    Co-evaluation              AM-PAC PT "6  Clicks" Mobility   Outcome Measure  Help needed turning from your back to your side while in a flat bed without using bedrails?: None Help needed moving from lying on your back to sitting on the side of a flat bed without using bedrails?: None Help needed moving to and from a bed to a chair (including a wheelchair)?: None Help needed standing up from a chair using your arms (e.g., wheelchair or bedside chair)?: None Help needed to walk in hospital room?: A Little Help needed climbing 3-5 steps with a railing? : A Little 6 Click Score: 22    End of Session Equipment Utilized During Treatment: Gait belt Activity Tolerance: Patient tolerated treatment well Patient left: Other (comment) (at sink for  washup) Nurse Communication: Mobility status (NT notified pt at sink washing up) PT Visit Diagnosis: Other abnormalities of gait and mobility (R26.89)     Time: [id number] PT Time Calculation (min) (ACUTE ONLY): 17 min  Charges:  $Gait Training: 8-22 mins                     Mabeline Caras, PT, DPT Acute Rehabilitation Services  Pager [phone] Office [place] 11/14/2020, 11:26 AM

## 2020-11-15 LAB — BASIC METABOLIC PANEL
Anion gap: 14 (ref 5–15)
BUN: 20 mg/dL (ref 8–23)
CO2: 26 mmol/L (ref 22–32)
Calcium: 8.5 mg/dL — ABNORMAL LOW (ref 8.9–10.3)
Chloride: 96 mmol/L — ABNORMAL LOW (ref 98–111)
Creatinine, Ser: 4.78 mg/dL — ABNORMAL HIGH (ref 0.44–1.00)
GFR, Estimated: 9 mL/min — ABNORMAL LOW (ref >60)
Glucose, Bld: 103 mg/dL — ABNORMAL HIGH (ref 70–99)
Potassium: 3.8 mmol/L (ref 3.5–5.1)
Sodium: 136 mmol/L (ref 135–145)

## 2020-11-15 LAB — CBC
HCT: 25.4 % — ABNORMAL LOW (ref 36.0–46.0)
Hemoglobin: 8.1 g/dL — ABNORMAL LOW (ref 12.0–15.0)
MCH: 27.7 pg (ref 26.0–34.0)
MCHC: 31.9 g/dL (ref 30.0–36.0)
MCV: 87 fL (ref 80.0–100.0)
Platelets: 214 10*3/uL (ref 150–400)
RBC: 2.92 MIL/uL — ABNORMAL LOW (ref 3.87–5.11)
RDW: 20.1 % — ABNORMAL HIGH (ref 11.5–15.5)
WBC: 6.9 10*3/uL (ref 4.0–10.5)
nRBC: 0 % (ref 0.0–0.2)

## 2020-11-15 MED ORDER — SODIUM CHLORIDE 0.9 % IV BOLUS
500.0000 mL | Freq: Once | INTRAVENOUS | Status: AC
  Administered 2020-11-15: 500 mL via INTRAVENOUS

## 2020-11-15 NOTE — Progress Notes (Addendum)
[facility] Progress Note   Subjective: Seen in room. Supposed to DC home yesterday but had nausea and vomiting and HA post HD. Post wt 50 kg. Suspect she is dry. HR 100s. Will give NS 500cc now.  Says she just felt awful. Discharge held. Hopefully can go home later this afternoon.   Objective Vitals:   11/14/20 2105 11/14/20 2352 11/15/20 0453 11/15/20 0750  BP: 132/67 134/62 (!) 148/75 (!) 142/70  Pulse: 99 97 99 90  Resp: '19 19 17 17  '$ Temp: 98.4 F (36.9 C)  98.3 F (36.8 C) 98.3 F (36.8 C)  TempSrc: Oral  Oral Oral  SpO2: 98% 97% 95% 91%  Weight:      Height:       Physical Exam General: Pleasant elderly female in NAD Heart: S1,S2 RRR SR on monitor.  Lungs: CTAB Abdomen: Soft, NT Extremities:no LE edema Dialysis Access: L AVF + T/B   Additional Objective Labs: Basic Metabolic Panel: Recent Labs  Lab 11/12/20 1007 11/13/20 0319 11/14/20 1326 11/15/20 0329  NA 134* 134* 136 136  K 4.4 4.2 3.6 3.8  CL 97* 95* 95* 96*  CO2 '24 28 26 26  '$ GLUCOSE 83 95 146* 103*  BUN 52* 34* 59* 20  CREATININE 11.39* 6.05* 9.35* 4.78*  CALCIUM 8.3* 8.3* 9.1 8.5*  PHOS 5.4*  --   --   --    Liver Function Tests: Recent Labs  Lab 11/09/20 2146 11/11/20 1021 11/12/20 1007  AST 14* 15  --   ALT 12 11  --   ALKPHOS 55 48  --   BILITOT 0.6 0.5  --   PROT 6.6 6.4*  --   ALBUMIN 3.4* 2.9* 3.0*   Recent Labs  Lab 11/10/20 0229 11/11/20 1021  LIPASE 61* 53*   CBC: Recent Labs  Lab 11/10/20 0229 11/12/20 1007 11/13/20 0319 11/14/20 1326 11/15/20 0329  WBC 8.3 6.3 6.3 7.3 6.9  NEUTROABS  --   --  3.6  --   --   HGB 8.8* 8.5* 8.0* 7.6* 8.1*  HCT 28.2* 27.1* 25.1* 24.5* 25.4*  MCV 87.0 87.7 87.2 88.4 87.0  PLT 185 177 167 202 214   Blood Culture    Component Value Date/Time   SDES URINE, RANDOM 06/20/2017 2222   SPECREQUEST NONE 06/20/2017 2222   CULT MULTIPLE SPECIES PRESENT, SUGGEST RECOLLECTION (A) 06/20/2017 2222   REPTSTATUS 06/22/2017  FINAL 06/20/2017 2222    Cardiac Enzymes: No results for input(s): CKTOTAL, CKMB, CKMBINDEX, TROPONINI in the last 168 hours. CBG: No results for input(s): GLUCAP in the last 168 hours. Iron Studies: No results for input(s): IRON, TIBC, TRANSFERRIN, FERRITIN in the last 72 hours. '@lablastinr3'$ @ Studies/Results: No results found. Medications:  sodium chloride     sodium chloride      amLODipine  10 mg Oral Daily   atorvastatin  20 mg Oral QHS   cinacalcet  30 mg Oral Q supper   cloNIDine  0.1 mg Oral BID   darbepoetin (ARANESP) injection - DIALYSIS  100 mcg Intravenous Q Thu-HD   feeding supplement (NEPRO CARB STEADY)  237 mL Oral BID BM   heparin  5,000 Units Subcutaneous Q8H   hydrALAZINE  75 mg Oral Q8H   metoprolol succinate  12.5 mg Oral Daily   mirtazapine  15 mg Oral QHS   multivitamin  1 tablet Oral QHS   nicotine  7 mg Transdermal Daily   pantoprazole  40 mg Oral Daily   sevelamer carbonate  800 mg Oral TID with meals   sodium chloride flush  3 mL Intravenous Q12H     Dialysis Orders:  [place] MWF   3.5h  350/500   57kg  2/2 bath  P2   -Heparin 2000 units initial bolus+ 1000  Units IV midrun  - hect 4 ug tiw  - venofer 100 mg IV x5 (2 left)  - mircera 60 mcg IV  q2 last 5/25  - home renal: auryxia 2 tid/ norvasc 10/ sensipar 30 hs/ clonidine 0.1 mg bid/ renvela 800 tid   Assessment/Plan: 1.HTN crisis - BP improved after adding Metoprolol succinate 12.5 mg PO q day and hydralazine.  Volume has been appropriately lowered. BP is stable.  2. PNA - multifocal PNA on CXR post HD 6/4 - IV antibiotics per primary. 3.  ESRD - HD MWF. Next HD as OP 11/16/20.  4. Chest pain - on HD. EKG neg, resolved 5. Volume-  Euvolemic appearing today. Well below dry weight. HD 06/08 Net UF 2.0L. Post wt 50 kg. Had nausea and HA post treatment. Suspect she is dry. Will give NS 500cc now over 1.5 hours. BP is fine.  6. Anemia-  Hgb 8.1 Did not get ESA with HD yesterday. Will order as OP  Friday.  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 Nutrition - 2.9 change to renal diet with fluid restrictions. Add nepro.   Rita H. Brown NP-C 11/15/2020, 8:27 AM  [facility] [phone]  Nephrology attending: Patient was seen and examined at bedside.  Chart reviewed.  I agree with assessment plan as outlined above. She had dialysis yesterday with around 2 L UF.  She developed some nausea vomiting therefore the discharge was held.  Trying 500 cc IV fluid this morning. Resume outpatient dialysis tomorrow.  Katheran James, MD [facility].

## 2020-11-15 NOTE — Plan of Care (Signed)
Problem: Education: Goal: Knowledge of General Education information will improve Description: Including pain rating scale, medication(s)/side effects and non-pharmacologic comfort measures 11/15/2020 1115 by Camillia Herter, RN Outcome: Adequate for Discharge 11/15/2020 0726 by Camillia Herter, RN Outcome: Progressing   Problem: Health Behavior/Discharge Planning: Goal: Ability to manage health-related needs will improve 11/15/2020 1115 by Camillia Herter, RN Outcome: Adequate for Discharge 11/15/2020 0726 by Camillia Herter, RN Outcome: Progressing   Problem: Clinical Measurements: Goal: Ability to maintain clinical measurements within normal limits will improve 11/15/2020 1115 by Camillia Herter, RN Outcome: Adequate for Discharge 11/15/2020 0726 by Camillia Herter, RN Outcome: Progressing Goal: Will remain free from infection 11/15/2020 1115 by Camillia Herter, RN Outcome: Adequate for Discharge 11/15/2020 0726 by Camillia Herter, RN Outcome: Progressing Goal: Diagnostic test results will improve 11/15/2020 1115 by Camillia Herter, RN Outcome: Adequate for Discharge 11/15/2020 0726 by Camillia Herter, RN Outcome: Progressing Goal: Respiratory complications will improve 11/15/2020 1115 by Camillia Herter, RN Outcome: Adequate for Discharge 11/15/2020 0726 by Camillia Herter, RN Outcome: Progressing Goal: Cardiovascular complication will be avoided 11/15/2020 1115 by Camillia Herter, RN Outcome: Adequate for Discharge 11/15/2020 0726 by Camillia Herter, RN Outcome: Progressing   Problem: Activity: Goal: Risk for activity intolerance will decrease 11/15/2020 1115 by Camillia Herter, RN Outcome: Adequate for Discharge 11/15/2020 0726 by Camillia Herter, RN Outcome: Progressing   Problem: Nutrition: Goal: Adequate nutrition will be maintained 11/15/2020 1115 by Camillia Herter, RN Outcome: Adequate for Discharge 11/15/2020 0726 by Camillia Herter, RN Outcome: Progressing   Problem: Coping: Goal: Level of anxiety  will decrease 11/15/2020 1115 by Camillia Herter, RN Outcome: Adequate for Discharge 11/15/2020 0726 by Camillia Herter, RN Outcome: Progressing   Problem: Elimination: Goal: Will not experience complications related to bowel motility 11/15/2020 1115 by Camillia Herter, RN Outcome: Adequate for Discharge 11/15/2020 0726 by Camillia Herter, RN Outcome: Progressing Goal: Will not experience complications related to urinary retention 11/15/2020 1115 by Camillia Herter, RN Outcome: Adequate for Discharge 11/15/2020 0726 by Camillia Herter, RN Outcome: Progressing   Problem: Pain Managment: Goal: General experience of comfort will improve 11/15/2020 1115 by Camillia Herter, RN Outcome: Adequate for Discharge 11/15/2020 0726 by Camillia Herter, RN Outcome: Progressing   Problem: Safety: Goal: Ability to remain free from injury will improve 11/15/2020 1115 by Camillia Herter, RN Outcome: Adequate for Discharge 11/15/2020 0726 by Camillia Herter, RN Outcome: Progressing   Problem: Skin Integrity: Goal: Risk for impaired skin integrity will decrease 11/15/2020 1115 by Camillia Herter, RN Outcome: Adequate for Discharge 11/15/2020 0726 by Camillia Herter, RN Outcome: Progressing   Problem: Education: Goal: Knowledge of disease and its progression will improve 11/15/2020 1115 by Camillia Herter, RN Outcome: Adequate for Discharge 11/15/2020 0726 by Camillia Herter, RN Outcome: Progressing Goal: Individualized Educational Video(s) 11/15/2020 1115 by Camillia Herter, RN Outcome: Adequate for Discharge 11/15/2020 0726 by Camillia Herter, RN Outcome: Progressing   Problem: Fluid Volume: Goal: Compliance with measures to maintain balanced fluid volume will improve 11/15/2020 1115 by Camillia Herter, RN Outcome: Adequate for Discharge 11/15/2020 0726 by Camillia Herter, RN Outcome: Progressing   Problem: Health Behavior/Discharge Planning: Goal: Ability to manage health-related needs will improve 11/15/2020 1115 by Camillia Herter, RN Outcome: Adequate for Discharge 11/15/2020 0726 by Camillia Herter, RN Outcome: Progressing   Problem: Nutritional: Goal: Ability  to make healthy dietary choices will improve 11/15/2020 1115 by Camillia Herter, RN Outcome: Adequate for Discharge 11/15/2020 0726 by Camillia Herter, RN Outcome: Progressing   Problem: Clinical Measurements: Goal: Complications related to the disease process, condition or treatment will be avoided or minimized 11/15/2020 1115 by Camillia Herter, RN Outcome: Adequate for Discharge 11/15/2020 0726 by Camillia Herter, RN Outcome: Progressing

## 2020-11-15 NOTE — Progress Notes (Signed)
PROGRESS NOTE    Ashley [name]  XBJ:[id number] DOB: 1951/03/09 DOA: 11/09/2020 PCP: Raymon Mutton., FNP    Brief Narrative:  Ashley [name] is a 70 year old female with past medical history significant for pancreatitis, ESRD on HD M WF, hepatitis C, hyperlipidemia, essential hypertension, history of CVA, history of gastric ulcer/gastropathy who presented to [facility], ED on 11/09/2020 with progressive shortness of breath.  Patient symptoms present for the last 1-2 days associate with abdominal pain and nausea.  Patient reports missed her last 2 HD sessions as she did not have any money for transportation.  In the ED, patient's BP was noted to be 220/100.  Patient was placed on BiPAP initially and weaned to nasal cannula.  Nephrology was consulted.  TR[facility] consulted for further evaluation management.   Assessment & Plan:   Active Problems:   ESRD (end stage renal disease) on dialysis (HCC)   Chronic pancreatitis (HCC)   Acute respiratory failure with hypoxia, POA Volume overload Multifocal pneumonia Patient presenting to the ED with progressive shortness of breath in the setting of missed HD sessions.  Negative.  Patient was afebrile without leukocytosis.  For multifocal pulmonary infiltrates, possibly infectious right mid and lower lung zone and left lung base.  Initially required BiPAP which was transitioned to supplemental oxygen. --Continue HD as below --Azithromycin/ceftriaxone, plan 5-day course --Supplemental oxygen now weaned off, will obtain ambulatory O2 sats to ensure no further desaturation  ESRD on HD Patient dialyzes at [facility] clinic on Monday/Wednesday/Friday schedule.  Has missed 2 HD sessions recently due to lack of transportation/cost. --Nephrology following, appreciate assistance --Renal diet, fluid restrict 1200 mL/day --Strict I's and O's and daily weights  Hypertensive urgency BP 220/110 on admission, likely secondary to volume overload in the setting of missed HD  sessions outpatient. --Amlodipine 10 mg p.o. daily --Metoprolol succinate 12.5 mg p.o. daily --Hydralazine 50 mg p.o. q8h --Clonidine 0.1 mg p.o. twice daily --Continue monitor blood pressure closely and adjust as needed  Atypical chest pain Patient complained of substernal chest pain during the HD on 11/12/2020.  EKG with no dynamic changes.  TTE notable for anterior/anterior lateral hypokinesis with LVEF 40-45% with LV regional wall motion abnormalities, concentric LVH, grade 1 diastolic dysfunction.  Case was discussed with cardiology, Dr. Sharyn Lull who recommended outpatient follow-up in the office. --Continue monitor on telemetry  Acute on chronic pancreatitis Mildly elevated lipase on admission.  Now tolerating diet with improvement of abdominal pain. --Oxycodone as needed  Anemia of chronic renal disease Hemoglobin stable. --Transfuse for hemoglobin less than 7.0    DVT prophylaxis: heparin injection 5,000 Units Start: 11/10/20 0600    Code Status: Full Code Family Communication: No family present at bedside this morning  Disposition Plan:  Level of care: Progressive Status is: Inpatient  Remains inpatient appropriate because:Ongoing diagnostic testing needed not appropriate for outpatient work up, Unsafe d/c plan, IV treatments appropriate due to intensity of illness or inability to take PO and Inpatient level of care appropriate due to severity of illness   Dispo: The patient is from: Home              Anticipated d/c is to: Home              Patient currently is not medically stable to d/c.   Difficult to place patient No   Consultants:  Nephrology Cardiology, case was discussed with Dr. Sharyn Lull by previous hospitalist  Procedures:  TTE  Antimicrobials:  Azithromycin 6/4>> Ceftriaxone 6/5>>  Subjective: Patient seen and examined at bedside, resting comfortably.  No specific complaints this morning.  HD planned for today.  No other questions or concerns at  this time.  Patient denies headache, no nausea/vomiting/diarrhea, no fever/chills/night sweats, no visual changes, no current chest pain, no palpitations, no current shortness of breath, no abdominal pain, no weakness, no fatigue, no paresthesias.  No acute events overnight per nurse staff.  Following return from hemodialysis, patient with tachycardia, elevated blood pressure and nausea/vomiting.  Discharge was held due to this.  Objective: Vitals:   11/14/20 2105 11/14/20 2352 11/15/20 0453 11/15/20 0750  BP: 132/67 134/62 (!) 148/75 (!) 142/70  Pulse: 99 97 99 90  Resp: 19 19 17 17   Temp: 98.4 F (36.9 C)  98.3 F (36.8 C) 98.3 F (36.8 C)  TempSrc: Oral  Oral Oral  SpO2: 98% 97% 95% 91%  Weight:      Height:        Intake/Output Summary (Last 24 hours) at 11/15/2020 1109 Last data filed at 11/14/2020 1630 Gross per 24 hour  Intake --  Output 2000 ml  Net -2000 ml   Filed Weights   11/12/20 1245 11/14/20 1255 11/14/20 1630  Weight: 56.6 kg 51.9 kg 50 kg    Examination:  General exam: Appears calm and comfortable  Respiratory system: Clear to auscultation. Respiratory effort normal.  On room air at rest Cardiovascular system: S1 & S2 heard, RRR. No JVD, murmurs, rubs, gallops or clicks. No pedal edema. Gastrointestinal system: Abdomen is nondistended, soft and nontender. No organomegaly or masses felt. Normal bowel sounds heard. Central nervous system: Alert and oriented. No focal neurological deficits. Extremities: Symmetric 5 x 5 power. Skin: No rashes, lesions or ulcers Psychiatry: Judgement and insight appear normal. Mood & affect appropriate.     Data Reviewed: I have personally reviewed following labs and imaging studies  CBC: Recent Labs  Lab 11/10/20 0229 11/12/20 1007 11/13/20 0319 11/14/20 1326 11/15/20 0329  WBC 8.3 6.3 6.3 7.3 6.9  NEUTROABS  --   --  3.6  --   --   HGB 8.8* 8.5* 8.0* 7.6* 8.1*  HCT 28.2* 27.1* 25.1* 24.5* 25.4*  MCV 87.0 87.7 87.2  88.4 87.0  PLT 185 177 167 202 214   Basic Metabolic Panel: Recent Labs  Lab 11/10/20 0229 11/12/20 1007 11/13/20 0319 11/14/20 1326 11/15/20 0329  NA 142 134* 134* 136 136  K 4.8 4.4 4.2 3.6 3.8  CL 105 97* 95* 95* 96*  CO2 22 24 28 26 26   GLUCOSE 121* 83 95 146* 103*  BUN 64* 52* 34* 59* 20  CREATININE 14.40* 11.39* 6.05* 9.35* 4.78*  CALCIUM 9.2 8.3* 8.3* 9.1 8.5*  PHOS  --  5.4*  --   --   --    GFR: Estimated Creatinine Clearance: 8.4 mL/min (A) (by C-G formula based on SCr of 4.78 mg/dL (H)). Liver Function Tests: Recent Labs  Lab 11/09/20 2146 11/11/20 1021 11/12/20 1007  AST 14* 15  --   ALT 12 11  --   ALKPHOS 55 48  --   BILITOT 0.6 0.5  --   PROT 6.6 6.4*  --   ALBUMIN 3.4* 2.9* 3.0*   Recent Labs  Lab 11/10/20 0229 11/11/20 1021  LIPASE 61* 53*   No results for input(s): AMMONIA in the last 168 hours. Coagulation Profile: No results for input(s): INR, PROTIME in the last 168 hours. Cardiac Enzymes: No results for input(s): CKTOTAL, CKMB, CKMBINDEX, TROPONINI in  the last 168 hours. BNP (last 3 results) No results for input(s): PROBNP in the last 8760 hours. HbA1C: No results for input(s): HGBA1C in the last 72 hours. CBG: No results for input(s): GLUCAP in the last 168 hours. Lipid Profile: No results for input(s): CHOL, HDL, LDLCALC, TRIG, CHOLHDL, LDLDIRECT in the last 72 hours. Thyroid Function Tests: No results for input(s): TSH, T4TOTAL, FREET4, T3FREE, THYROIDAB in the last 72 hours. Anemia Panel: No results for input(s): VITAMINB12, FOLATE, FERRITIN, TIBC, IRON, RETICCTPCT in the last 72 hours. Sepsis Labs: No results for input(s): PROCALCITON, LATICACIDVEN in the last 168 hours.  Recent Results (from the past 240 hour(s))  Resp Panel by RT-PCR (Flu A&B, Covid) Nasopharyngeal Swab     Status: None   Collection Time: 11/09/20 10:42 PM   Specimen: Nasopharyngeal Swab; Nasopharyngeal(NP) swabs in vial transport medium  Result Value Ref  Range Status   SARS Coronavirus 2 by RT PCR NEGATIVE NEGATIVE Final    Comment: (NOTE) SARS-CoV-2 target nucleic acids are NOT DETECTED.  The SARS-CoV-2 RNA is generally detectable in upper respiratory specimens during the acute phase of infection. The lowest concentration of SARS-CoV-2 viral copies this assay can detect is 138 copies/mL. A negative result does not preclude SARS-Cov-2 infection and should not be used as the sole basis for treatment or other patient management decisions. A negative result may occur with  improper specimen collection/handling, submission of specimen other than nasopharyngeal swab, presence of viral mutation(s) within the areas targeted by this assay, and inadequate number of viral copies(<138 copies/mL). A negative result must be combined with clinical observations, patient history, and epidemiological information. The expected result is Negative.  Fact Sheet for Patients:  BloggerCourse.com  Fact Sheet for Healthcare Providers:  SeriousBroker.it  This test is no t yet approved or cleared by the [place] FDA and  has been authorized for detection and/or diagnosis of SARS-CoV-2 by FDA under an Emergency Use Authorization (EUA). This EUA will remain  in effect (meaning this test can be used) for the duration of the COVID-19 declaration under Section 564(b)(1) of the Act, 21 U.S.C.section 360bbb-3(b)(1), unless the authorization is terminated  or revoked sooner.       Influenza A by PCR NEGATIVE NEGATIVE Final   Influenza B by PCR NEGATIVE NEGATIVE Final    Comment: (NOTE) The Xpert Xpress SARS-CoV-2/FLU/RSV plus assay is intended as an aid in the diagnosis of influenza from Nasopharyngeal swab specimens and should not be used as a sole basis for treatment. Nasal washings and aspirates are unacceptable for Xpert Xpress SARS-CoV-2/FLU/RSV testing.  Fact Sheet for  Patients: BloggerCourse.com  Fact Sheet for Healthcare Providers: SeriousBroker.it  This test is not yet approved or cleared by the [place] FDA and has been authorized for detection and/or diagnosis of SARS-CoV-2 by FDA under an Emergency Use Authorization (EUA). This EUA will remain in effect (meaning this test can be used) for the duration of the COVID-19 declaration under Section 564(b)(1) of the Act, 21 U.S.C. section 360bbb-3(b)(1), unless the authorization is terminated or revoked.  Performed at [facility], [address] [place], [place] [address]          Radiology Studies: No results found.      Scheduled Meds:  amLODipine  10 mg Oral Daily   atorvastatin  20 mg Oral QHS   cinacalcet  30 mg Oral Q supper   cloNIDine  0.1 mg Oral BID   feeding supplement (NEPRO CARB STEADY)  237 mL  Oral BID BM   heparin  5,000 Units Subcutaneous Q8H   hydrALAZINE  75 mg Oral Q8H   metoprolol succinate  12.5 mg Oral Daily   mirtazapine  15 mg Oral QHS   multivitamin  1 tablet Oral QHS   nicotine  7 mg Transdermal Daily   pantoprazole  40 mg Oral Daily   sevelamer carbonate  800 mg Oral TID with meals   sodium chloride flush  3 mL Intravenous Q12H   Continuous Infusions:  sodium chloride       LOS: 5 days    Time spent: 38 minutes spent on chart review, discussion with nursing staff, consultants, updating family and interview/physical exam; more than 50% of that time was spent in counseling and/or coordination of care.    Alvira Philips [place], DO [facility] Available via Epic secure chat 7am-7pm After these hours, please refer to coverage provider listed on [email or web address] 11/15/2020, 11:09 AM

## 2020-11-15 NOTE — Plan of Care (Signed)

## 2021-02-03 ENCOUNTER — Emergency Department (HOSPITAL_COMMUNITY): Payer: Medicare Other

## 2021-02-03 ENCOUNTER — Other Ambulatory Visit: Payer: Self-pay

## 2021-02-03 ENCOUNTER — Encounter (HOSPITAL_COMMUNITY): Payer: Self-pay

## 2021-02-03 ENCOUNTER — Emergency Department (HOSPITAL_COMMUNITY)
Admission: EM | Admit: 2021-02-03 | Discharge: 2021-02-04 | Disposition: A | Discharge status: Home / Self Care | Payer: Medicare Other | Source: Home / Self Care | Attending: Emergency Medicine | Admitting: Emergency Medicine

## 2021-02-03 DIAGNOSIS — Z79899 Other long term (current) drug therapy: Secondary | ICD-10-CM | POA: Insufficient documentation

## 2021-02-03 DIAGNOSIS — Z992 Dependence on renal dialysis: Secondary | ICD-10-CM | POA: Insufficient documentation

## 2021-02-03 DIAGNOSIS — M5441 Lumbago with sciatica, right side: Secondary | ICD-10-CM | POA: Insufficient documentation

## 2021-02-03 DIAGNOSIS — I120 Hypertensive chronic kidney disease with stage 5 chronic kidney disease or end stage renal disease: Secondary | ICD-10-CM | POA: Insufficient documentation

## 2021-02-03 DIAGNOSIS — F17210 Nicotine dependence, cigarettes, uncomplicated: Secondary | ICD-10-CM | POA: Insufficient documentation

## 2021-02-03 DIAGNOSIS — N186 End stage renal disease: Secondary | ICD-10-CM | POA: Insufficient documentation

## 2021-02-03 DIAGNOSIS — R109 Unspecified abdominal pain: Secondary | ICD-10-CM | POA: Insufficient documentation

## 2021-02-03 DIAGNOSIS — M5431 Sciatica, right side: Secondary | ICD-10-CM

## 2021-02-03 LAB — CBC WITH DIFFERENTIAL/PLATELET
Abs Immature Granulocytes: 0.06 10*3/uL (ref 0.00–0.07)
Basophils Absolute: 0 10*3/uL (ref 0.0–0.1)
Basophils Relative: 1 %
Eosinophils Absolute: 0.1 10*3/uL (ref 0.0–0.5)
Eosinophils Relative: 2 %
HCT: 37.4 % (ref 36.0–46.0)
Hemoglobin: 12.2 g/dL (ref 12.0–15.0)
Immature Granulocytes: 1 %
Lymphocytes Relative: 26 %
Lymphs Abs: 1.6 10*3/uL (ref 0.7–4.0)
MCH: 29 pg (ref 26.0–34.0)
MCHC: 32.6 g/dL (ref 30.0–36.0)
MCV: 89 fL (ref 80.0–100.0)
Monocytes Absolute: 0.7 10*3/uL (ref 0.1–1.0)
Monocytes Relative: 12 %
Neutro Abs: 3.5 10*3/uL (ref 1.7–7.7)
Neutrophils Relative %: 58 %
Platelets: 323 10*3/uL (ref 150–400)
RBC: 4.2 MIL/uL (ref 3.87–5.11)
RDW: 17 % — ABNORMAL HIGH (ref 11.5–15.5)
WBC: 6 10*3/uL (ref 4.0–10.5)
nRBC: 0.5 % — ABNORMAL HIGH (ref 0.0–0.2)

## 2021-02-03 LAB — COMPREHENSIVE METABOLIC PANEL
ALT: 9 U/L (ref 0–44)
AST: 22 U/L (ref 15–41)
Albumin: 3.9 g/dL (ref 3.5–5.0)
Alkaline Phosphatase: 45 U/L (ref 38–126)
Anion gap: 17 — ABNORMAL HIGH (ref 5–15)
BUN: 48 mg/dL — ABNORMAL HIGH (ref 8–23)
CO2: 26 mmol/L (ref 22–32)
Calcium: 10 mg/dL (ref 8.9–10.3)
Chloride: 94 mmol/L — ABNORMAL LOW (ref 98–111)
Creatinine, Ser: 13.33 mg/dL — ABNORMAL HIGH (ref 0.44–1.00)
GFR, Estimated: 3 mL/min — ABNORMAL LOW (ref >60)
Glucose, Bld: 93 mg/dL (ref 70–99)
Potassium: 4.6 mmol/L (ref 3.5–5.1)
Sodium: 137 mmol/L (ref 135–145)
Total Bilirubin: 0.5 mg/dL (ref 0.3–1.2)
Total Protein: 7.9 g/dL (ref 6.5–8.1)

## 2021-02-03 LAB — LIPASE, BLOOD: Lipase: 81 U/L — ABNORMAL HIGH (ref 11–51)

## 2021-02-03 MED ORDER — IOHEXOL 350 MG/ML SOLN
50.0000 mL | Freq: Once | INTRAVENOUS | Status: AC | PRN
  Administered 2021-02-03: 50 mL via INTRAVENOUS

## 2021-02-03 MED ORDER — FENTANYL CITRATE PF 50 MCG/ML IJ SOSY
50.0000 ug | PREFILLED_SYRINGE | INTRAMUSCULAR | Status: DC | PRN
  Administered 2021-02-03: 50 ug via INTRAVENOUS
  Filled 2021-02-03: qty 1

## 2021-02-03 NOTE — ED Triage Notes (Signed)
Patient arrives with complaints of right-sided flank pain and abdominal pain x2 days. Patient states that the pain is causing her to have minimal appetite and difficulty sleeping.   Patient is a dialysis patient (MWF), left arm restricted.

## 2021-02-03 NOTE — ED Provider Notes (Addendum)
Emergency Medicine Provider Triage Evaluation Note  Ashley [name] , a 70 y.o. female  was evaluated in triage.  Pt complains of and pain and flank pain. Dialysis MWF, Last full session. Does not make urine. Pain to right abdomen and right flank. No hx of kidney stones, AAA, dissections. Hx of sciatica however does not feel similar. Pain began Friday, constant. Having diarrhea. Decreased appetite, and nausea  Review of Systems  Positive: Abd pain, flank pain, diarrhea Negative: Fever, emesis, CP, SOB  Physical Exam  BP (!) 180/106 (BP Location: Left Arm)   Pulse 63   Temp 98.4 F (36.9 C) (Oral)   Resp 16   Ht '5\' 2"'$  (1.575 m)   Wt 53.1 kg   SpO2 90%   BMI 21.40 kg/m  Gen:   Awake, no distress   Resp:  Normal effort  ABD:  Tenderness to right flank without overlying skin changes MSK:   Moves extremities without difficulty  Other:    Medical Decision Making  Medically screening exam initiated at 4:32 PM.  Appropriate orders placed.  Ashley [name] was informed that the remainder of the evaluation will be completed by another provider, this initial triage assessment does not replace that evaluation, and the importance of remaining in the ED until their evaluation is complete.  Abd pain, diarrhea, flank pain      Yenny Kosa A, PA-C 02/03/21 1634    Wyvonnia Dusky, MD 02/04/21 [phone]

## 2021-02-03 NOTE — ED Notes (Signed)
Pt requesting pain medication for severe abdominal pain. Dr. Billy Fischer made aware.

## 2021-02-04 MED ORDER — METHOCARBAMOL 500 MG PO TABS: 500.0000 mg | ORAL_TABLET | Freq: Two times a day (BID) | ORAL | 0 refills | Status: DC

## 2021-02-04 MED ORDER — METHOCARBAMOL 500 MG PO TABS
500.0000 mg | ORAL_TABLET | Freq: Once | ORAL | Status: AC
  Administered 2021-02-04: 500 mg via ORAL
  Filled 2021-02-04: qty 1

## 2021-02-04 MED ORDER — KETOROLAC TROMETHAMINE 15 MG/ML IJ SOLN
7.5000 mg | Freq: Once | INTRAMUSCULAR | Status: AC
  Administered 2021-02-04: 7.5 mg via INTRAVENOUS
  Filled 2021-02-04: qty 1

## 2021-02-04 NOTE — ED Notes (Signed)
E-signature pad unavailable at time of pt discharge. This RN discussed discharge materials with pt and answered all pt questions. Pt stated understanding of discharge material. ? ?
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
## 2021-02-04 NOTE — Discharge Instructions (Addendum)
I have consulted our transitional care team to assist you with home health/physical therapy. They will call you at home within 24 to 72 hours.  For pain control you may take at 1000 mg of Tylenol every 8 hours scheduled.   You may use over-the-counter Acetaminophen (Tylenol), topical muscle creams such as SalonPas, [employer], Bengay, etc. Please stretch, apply ice or heat (whichever helps), and have massage therapy for additional assistance.

## 2021-02-05 ENCOUNTER — Inpatient Hospital Stay (HOSPITAL_COMMUNITY)
Admission: EM | Admit: 2021-02-05 | Discharge: 2021-02-12 | DRG: 377 | Disposition: A | Discharge status: Home / Self Care | Payer: Medicare Other | Attending: Internal Medicine | Admitting: Internal Medicine

## 2021-02-05 ENCOUNTER — Other Ambulatory Visit: Payer: Self-pay

## 2021-02-05 ENCOUNTER — Encounter (HOSPITAL_COMMUNITY): Payer: Self-pay | Admitting: Emergency Medicine

## 2021-02-05 DIAGNOSIS — D631 Anemia in chronic kidney disease: Secondary | ICD-10-CM | POA: Diagnosis present

## 2021-02-05 DIAGNOSIS — Z8673 Personal history of transient ischemic attack (TIA), and cerebral infarction without residual deficits: Secondary | ICD-10-CM

## 2021-02-05 DIAGNOSIS — E782 Mixed hyperlipidemia: Secondary | ICD-10-CM | POA: Diagnosis present

## 2021-02-05 DIAGNOSIS — I160 Hypertensive urgency: Secondary | ICD-10-CM | POA: Diagnosis present

## 2021-02-05 DIAGNOSIS — Z886 Allergy status to analgesic agent status: Secondary | ICD-10-CM

## 2021-02-05 DIAGNOSIS — Z83438 Family history of other disorder of lipoprotein metabolism and other lipidemia: Secondary | ICD-10-CM

## 2021-02-05 DIAGNOSIS — N189 Chronic kidney disease, unspecified: Secondary | ICD-10-CM

## 2021-02-05 DIAGNOSIS — Z8711 Personal history of peptic ulcer disease: Secondary | ICD-10-CM

## 2021-02-05 DIAGNOSIS — R10811 Right upper quadrant abdominal tenderness: Secondary | ICD-10-CM

## 2021-02-05 DIAGNOSIS — K922 Gastrointestinal hemorrhage, unspecified: Secondary | ICD-10-CM | POA: Diagnosis present

## 2021-02-05 DIAGNOSIS — U071 COVID-19: Secondary | ICD-10-CM | POA: Diagnosis present

## 2021-02-05 DIAGNOSIS — F17210 Nicotine dependence, cigarettes, uncomplicated: Secondary | ICD-10-CM | POA: Diagnosis present

## 2021-02-05 DIAGNOSIS — R109 Unspecified abdominal pain: Secondary | ICD-10-CM

## 2021-02-05 DIAGNOSIS — M898X9 Other specified disorders of bone, unspecified site: Secondary | ICD-10-CM | POA: Diagnosis present

## 2021-02-05 DIAGNOSIS — I120 Hypertensive chronic kidney disease with stage 5 chronic kidney disease or end stage renal disease: Secondary | ICD-10-CM | POA: Diagnosis present

## 2021-02-05 DIAGNOSIS — B1920 Unspecified viral hepatitis C without hepatic coma: Secondary | ICD-10-CM | POA: Diagnosis present

## 2021-02-05 DIAGNOSIS — Z992 Dependence on renal dialysis: Secondary | ICD-10-CM

## 2021-02-05 DIAGNOSIS — R0600 Dyspnea, unspecified: Secondary | ICD-10-CM

## 2021-02-05 DIAGNOSIS — K31811 Angiodysplasia of stomach and duodenum with bleeding: Principal | ICD-10-CM | POA: Diagnosis present

## 2021-02-05 DIAGNOSIS — R748 Abnormal levels of other serum enzymes: Secondary | ICD-10-CM

## 2021-02-05 DIAGNOSIS — E875 Hyperkalemia: Secondary | ICD-10-CM | POA: Diagnosis present

## 2021-02-05 DIAGNOSIS — I10 Essential (primary) hypertension: Secondary | ICD-10-CM

## 2021-02-05 DIAGNOSIS — K861 Other chronic pancreatitis: Secondary | ICD-10-CM | POA: Diagnosis present

## 2021-02-05 DIAGNOSIS — K219 Gastro-esophageal reflux disease without esophagitis: Secondary | ICD-10-CM | POA: Diagnosis present

## 2021-02-05 DIAGNOSIS — J1282 Pneumonia due to coronavirus disease 2019: Secondary | ICD-10-CM | POA: Diagnosis present

## 2021-02-05 DIAGNOSIS — N186 End stage renal disease: Secondary | ICD-10-CM | POA: Diagnosis present

## 2021-02-05 DIAGNOSIS — Z79899 Other long term (current) drug therapy: Secondary | ICD-10-CM

## 2021-02-05 DIAGNOSIS — Z8249 Family history of ischemic heart disease and other diseases of the circulatory system: Secondary | ICD-10-CM

## 2021-02-05 DIAGNOSIS — N2581 Secondary hyperparathyroidism of renal origin: Secondary | ICD-10-CM | POA: Diagnosis present

## 2021-02-05 LAB — COMPREHENSIVE METABOLIC PANEL
ALT: 7 U/L (ref 0–44)
AST: 19 U/L (ref 15–41)
Albumin: 4.1 g/dL (ref 3.5–5.0)
Alkaline Phosphatase: 50 U/L (ref 38–126)
Anion gap: 21 — ABNORMAL HIGH (ref 5–15)
BUN: 88 mg/dL — ABNORMAL HIGH (ref 8–23)
CO2: 22 mmol/L (ref 22–32)
Calcium: 10.2 mg/dL (ref 8.9–10.3)
Chloride: 94 mmol/L — ABNORMAL LOW (ref 98–111)
Creatinine, Ser: 17.59 mg/dL — ABNORMAL HIGH (ref 0.44–1.00)
GFR, Estimated: 2 mL/min — ABNORMAL LOW (ref >60)
Glucose, Bld: 87 mg/dL (ref 70–99)
Potassium: 5.5 mmol/L — ABNORMAL HIGH (ref 3.5–5.1)
Sodium: 137 mmol/L (ref 135–145)
Total Bilirubin: 1.1 mg/dL (ref 0.3–1.2)
Total Protein: 8 g/dL (ref 6.5–8.1)

## 2021-02-05 LAB — POC OCCULT BLOOD, ED: Fecal Occult Bld: POSITIVE — AB

## 2021-02-05 LAB — LIPASE, BLOOD: Lipase: 59 U/L — ABNORMAL HIGH (ref 11–51)

## 2021-02-05 MED ORDER — MORPHINE SULFATE (PF) 4 MG/ML IV SOLN
4.0000 mg | Freq: Once | INTRAVENOUS | Status: AC
  Administered 2021-02-06: 4 mg via INTRAVENOUS
  Filled 2021-02-05: qty 1

## 2021-02-05 MED ORDER — ONDANSETRON HCL 4 MG/2ML IJ SOLN
4.0000 mg | Freq: Once | INTRAMUSCULAR | Status: AC
  Administered 2021-02-06: 4 mg via INTRAVENOUS
  Filled 2021-02-05: qty 2

## 2021-02-05 NOTE — ED Provider Notes (Signed)
Emergency Medicine Provider Triage Evaluation Note  Ashley [name] , a 70 y.o. female  was evaluated in triage.  Pt complains of r flank pain, nv, back stools.  Review of Systems  Positive: R flank pain, nv, back stools Negative: fever  Physical Exam  BP (!) 201/80   Pulse 65   Temp 99.5 F (37.5 C) (Oral)   Resp 18   SpO2 97%  Gen:   Awake, no distress   Resp:  Normal effort MSK:   Moves extremities without difficulty  Other:  Abd tender to the epigastrium  Medical Decision Making  Medically screening exam initiated at 5:57 PM.  Appropriate orders placed.  Ashley [name] was informed that the remainder of the evaluation will be completed by another provider, this initial triage assessment does not replace that evaluation, and the importance of remaining in the ED until their evaluation is complete.     Rodney Booze, PA-C 02/05/21 [place], [place], DO 02/05/21 2246

## 2021-02-05 NOTE — ED Notes (Signed)
Spoke with daughter and updated her on care provided up to this point.
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
## 2021-02-05 NOTE — ED Triage Notes (Signed)
Pt BIB [facility] from home, c/o right sided flank pain, and nausea/vomiting, dark stools that started today. Dialysis MWF, unable to go dialysis yesterday.

## 2021-02-05 NOTE — ED Notes (Signed)
Pt called x1 for vitals with no response

## 2021-02-06 ENCOUNTER — Encounter (HOSPITAL_COMMUNITY): Payer: Self-pay | Admitting: Internal Medicine

## 2021-02-06 ENCOUNTER — Inpatient Hospital Stay (HOSPITAL_COMMUNITY): Payer: Medicare Other

## 2021-02-06 ENCOUNTER — Emergency Department (HOSPITAL_COMMUNITY): Payer: Medicare Other

## 2021-02-06 DIAGNOSIS — Z8711 Personal history of peptic ulcer disease: Secondary | ICD-10-CM | POA: Diagnosis not present

## 2021-02-06 DIAGNOSIS — F17210 Nicotine dependence, cigarettes, uncomplicated: Secondary | ICD-10-CM | POA: Diagnosis present

## 2021-02-06 DIAGNOSIS — B1920 Unspecified viral hepatitis C without hepatic coma: Secondary | ICD-10-CM | POA: Diagnosis present

## 2021-02-06 DIAGNOSIS — I160 Hypertensive urgency: Secondary | ICD-10-CM

## 2021-02-06 DIAGNOSIS — Z79899 Other long term (current) drug therapy: Secondary | ICD-10-CM | POA: Diagnosis not present

## 2021-02-06 DIAGNOSIS — K861 Other chronic pancreatitis: Secondary | ICD-10-CM | POA: Diagnosis present

## 2021-02-06 DIAGNOSIS — N186 End stage renal disease: Secondary | ICD-10-CM

## 2021-02-06 DIAGNOSIS — N189 Chronic kidney disease, unspecified: Secondary | ICD-10-CM | POA: Diagnosis not present

## 2021-02-06 DIAGNOSIS — Z886 Allergy status to analgesic agent status: Secondary | ICD-10-CM | POA: Diagnosis not present

## 2021-02-06 DIAGNOSIS — Z8673 Personal history of transient ischemic attack (TIA), and cerebral infarction without residual deficits: Secondary | ICD-10-CM | POA: Diagnosis not present

## 2021-02-06 DIAGNOSIS — Z992 Dependence on renal dialysis: Secondary | ICD-10-CM

## 2021-02-06 DIAGNOSIS — N2581 Secondary hyperparathyroidism of renal origin: Secondary | ICD-10-CM | POA: Diagnosis present

## 2021-02-06 DIAGNOSIS — K31819 Angiodysplasia of stomach and duodenum without bleeding: Secondary | ICD-10-CM | POA: Diagnosis not present

## 2021-02-06 DIAGNOSIS — Z8249 Family history of ischemic heart disease and other diseases of the circulatory system: Secondary | ICD-10-CM | POA: Diagnosis not present

## 2021-02-06 DIAGNOSIS — E875 Hyperkalemia: Secondary | ICD-10-CM

## 2021-02-06 DIAGNOSIS — D631 Anemia in chronic kidney disease: Secondary | ICD-10-CM | POA: Diagnosis present

## 2021-02-06 DIAGNOSIS — M898X9 Other specified disorders of bone, unspecified site: Secondary | ICD-10-CM | POA: Diagnosis present

## 2021-02-06 DIAGNOSIS — K219 Gastro-esophageal reflux disease without esophagitis: Secondary | ICD-10-CM | POA: Diagnosis present

## 2021-02-06 DIAGNOSIS — K922 Gastrointestinal hemorrhage, unspecified: Secondary | ICD-10-CM | POA: Diagnosis present

## 2021-02-06 DIAGNOSIS — E782 Mixed hyperlipidemia: Secondary | ICD-10-CM | POA: Diagnosis present

## 2021-02-06 DIAGNOSIS — U071 COVID-19: Secondary | ICD-10-CM | POA: Diagnosis present

## 2021-02-06 DIAGNOSIS — K31811 Angiodysplasia of stomach and duodenum with bleeding: Secondary | ICD-10-CM | POA: Diagnosis present

## 2021-02-06 DIAGNOSIS — Z83438 Family history of other disorder of lipoprotein metabolism and other lipidemia: Secondary | ICD-10-CM | POA: Diagnosis not present

## 2021-02-06 DIAGNOSIS — J1282 Pneumonia due to coronavirus disease 2019: Secondary | ICD-10-CM | POA: Diagnosis present

## 2021-02-06 DIAGNOSIS — I120 Hypertensive chronic kidney disease with stage 5 chronic kidney disease or end stage renal disease: Secondary | ICD-10-CM | POA: Diagnosis present

## 2021-02-06 DIAGNOSIS — I10 Essential (primary) hypertension: Secondary | ICD-10-CM | POA: Diagnosis not present

## 2021-02-06 LAB — COMPREHENSIVE METABOLIC PANEL
ALT: <5 U/L (ref 0–44)
AST: 19 U/L (ref 15–41)
Albumin: 3 g/dL — ABNORMAL LOW (ref 3.5–5.0)
Alkaline Phosphatase: 36 U/L — ABNORMAL LOW (ref 38–126)
Anion gap: 16 — ABNORMAL HIGH (ref 5–15)
BUN: 102 mg/dL — ABNORMAL HIGH (ref 8–23)
CO2: 24 mmol/L (ref 22–32)
Calcium: 9.1 mg/dL (ref 8.9–10.3)
Chloride: 97 mmol/L — ABNORMAL LOW (ref 98–111)
Creatinine, Ser: 18.72 mg/dL — ABNORMAL HIGH (ref 0.44–1.00)
GFR, Estimated: 2 mL/min — ABNORMAL LOW (ref >60)
Glucose, Bld: 87 mg/dL (ref 70–99)
Potassium: 5.3 mmol/L — ABNORMAL HIGH (ref 3.5–5.1)
Sodium: 137 mmol/L (ref 135–145)
Total Bilirubin: 0.9 mg/dL (ref 0.3–1.2)
Total Protein: 6 g/dL — ABNORMAL LOW (ref 6.5–8.1)

## 2021-02-06 LAB — SARS CORONAVIRUS 2 (TAT 6-24 HRS): SARS Coronavirus 2: POSITIVE — AB

## 2021-02-06 LAB — CBC
HCT: 28.4 % — ABNORMAL LOW (ref 36.0–46.0)
HCT: 28.8 % — ABNORMAL LOW (ref 36.0–46.0)
HCT: 26.8 % — ABNORMAL LOW (ref 36.0–46.0)
Hemoglobin: 9 g/dL — ABNORMAL LOW (ref 12.0–15.0)
Hemoglobin: 9.1 g/dL — ABNORMAL LOW (ref 12.0–15.0)
Hemoglobin: 8.6 g/dL — ABNORMAL LOW (ref 12.0–15.0)
MCH: 28.3 pg (ref 26.0–34.0)
MCH: 28 pg (ref 26.0–34.0)
MCH: 28.5 pg (ref 26.0–34.0)
MCHC: 31.7 g/dL (ref 30.0–36.0)
MCHC: 31.6 g/dL (ref 30.0–36.0)
MCHC: 32.1 g/dL (ref 30.0–36.0)
MCV: 89.3 fL (ref 80.0–100.0)
MCV: 88.6 fL (ref 80.0–100.0)
MCV: 88.7 fL (ref 80.0–100.0)
Platelets: 280 10*3/uL (ref 150–400)
Platelets: 254 10*3/uL (ref 150–400)
Platelets: 242 10*3/uL (ref 150–400)
RBC: 3.18 MIL/uL — ABNORMAL LOW (ref 3.87–5.11)
RBC: 3.25 MIL/uL — ABNORMAL LOW (ref 3.87–5.11)
RBC: 3.02 MIL/uL — ABNORMAL LOW (ref 3.87–5.11)
RDW: 18.4 % — ABNORMAL HIGH (ref 11.5–15.5)
RDW: 18.1 % — ABNORMAL HIGH (ref 11.5–15.5)
RDW: 18.1 % — ABNORMAL HIGH (ref 11.5–15.5)
WBC: 5.7 10*3/uL (ref 4.0–10.5)
WBC: 6.6 10*3/uL (ref 4.0–10.5)
WBC: 6.1 10*3/uL (ref 4.0–10.5)
nRBC: 0 % (ref 0.0–0.2)
nRBC: 0 % (ref 0.0–0.2)
nRBC: 0 % (ref 0.0–0.2)

## 2021-02-06 LAB — PHOSPHORUS: Phosphorus: 5.6 mg/dL — ABNORMAL HIGH (ref 2.5–4.6)

## 2021-02-06 LAB — CBC WITH DIFFERENTIAL/PLATELET
Abs Immature Granulocytes: 0.06 10*3/uL (ref 0.00–0.07)
Basophils Absolute: 0 10*3/uL (ref 0.0–0.1)
Basophils Relative: 0 %
Eosinophils Absolute: 0.2 10*3/uL (ref 0.0–0.5)
Eosinophils Relative: 2 %
HCT: 31.2 % — ABNORMAL LOW (ref 36.0–46.0)
Hemoglobin: 9.8 g/dL — ABNORMAL LOW (ref 12.0–15.0)
Immature Granulocytes: 1 %
Lymphocytes Relative: 29 %
Lymphs Abs: 1.9 10*3/uL (ref 0.7–4.0)
MCH: 28.4 pg (ref 26.0–34.0)
MCHC: 31.4 g/dL (ref 30.0–36.0)
MCV: 90.4 fL (ref 80.0–100.0)
Monocytes Absolute: 0.8 10*3/uL (ref 0.1–1.0)
Monocytes Relative: 12 %
Neutro Abs: 3.7 10*3/uL (ref 1.7–7.7)
Neutrophils Relative %: 56 %
Platelets: 274 10*3/uL (ref 150–400)
RBC: 3.45 MIL/uL — ABNORMAL LOW (ref 3.87–5.11)
RDW: 18 % — ABNORMAL HIGH (ref 11.5–15.5)
WBC: 6.6 10*3/uL (ref 4.0–10.5)
nRBC: 0.5 % — ABNORMAL HIGH (ref 0.0–0.2)

## 2021-02-06 LAB — BPAM RBC
Blood Product Expiration Date: 202209242359
Blood Product Expiration Date: 202209242359
Unit Type and Rh: 600
Unit Type and Rh: 600

## 2021-02-06 LAB — TYPE AND SCREEN
ABO/RH(D): A NEG
Antibody Screen: NEGATIVE
Unit division: 0
Unit division: 0

## 2021-02-06 LAB — PROTIME-INR
INR: 1.1 (ref 0.8–1.2)
Prothrombin Time: 14.1 seconds (ref 11.4–15.2)

## 2021-02-06 LAB — CK: Total CK: 42 U/L (ref 38–234)

## 2021-02-06 LAB — MAGNESIUM: Magnesium: 2.4 mg/dL (ref 1.7–2.4)

## 2021-02-06 MED ORDER — ONDANSETRON HCL 4 MG/2ML IJ SOLN: 4.0000 mg | Freq: Four times a day (QID) | INTRAMUSCULAR | Status: DC | PRN

## 2021-02-06 MED ORDER — PANTOPRAZOLE INFUSION (NEW) - SIMPLE MED
8.0000 mg/h | INTRAVENOUS | Status: DC
  Administered 2021-02-06: 8 mg/h via INTRAVENOUS
  Filled 2021-02-06: qty 80

## 2021-02-06 MED ORDER — PANTOPRAZOLE 80MG IVPB - SIMPLE MED
80.0000 mg | Freq: Once | INTRAVENOUS | Status: AC
  Administered 2021-02-06: 80 mg via INTRAVENOUS
  Filled 2021-02-06: qty 80

## 2021-02-06 MED ORDER — BOOST / RESOURCE BREEZE PO LIQD CUSTOM
1.0000 | Freq: Three times a day (TID) | ORAL | Status: DC
Start: 2021-02-06 — End: 2021-02-12
  Administered 2021-02-06 – 2021-02-12 (×14): 1 via ORAL

## 2021-02-06 MED ORDER — SEVELAMER CARBONATE 800 MG PO TABS
800.0000 mg | ORAL_TABLET | Freq: Three times a day (TID) | ORAL | Status: DC
  Administered 2021-02-06: 800 mg via ORAL
  Filled 2021-02-06 (×2): qty 1

## 2021-02-06 MED ORDER — FERRIC CITRATE 1 GM 210 MG(FE) PO TABS: 210.0000 mg | ORAL_TABLET | Freq: Three times a day (TID) | ORAL | Status: DC

## 2021-02-06 MED ORDER — METOPROLOL SUCCINATE ER 25 MG PO TB24
12.5000 mg | ORAL_TABLET | Freq: Every day | ORAL | Status: DC
  Administered 2021-02-09 – 2021-02-12 (×4): 12.5 mg via ORAL
  Filled 2021-02-06 (×5): qty 1

## 2021-02-06 MED ORDER — LIDOCAINE 5 % EX PTCH
1.0000 | MEDICATED_PATCH | CUTANEOUS | Status: DC
  Administered 2021-02-06 – 2021-02-10 (×4): 1 via TRANSDERMAL
  Filled 2021-02-06 (×5): qty 1

## 2021-02-06 MED ORDER — LABETALOL HCL 5 MG/ML IV SOLN: 20.0000 mg | INTRAVENOUS | Status: DC | PRN

## 2021-02-06 MED ORDER — FENTANYL CITRATE PF 50 MCG/ML IJ SOSY: 25.0000 ug | PREFILLED_SYRINGE | INTRAMUSCULAR | Status: DC | PRN

## 2021-02-06 MED ORDER — FENTANYL CITRATE PF 50 MCG/ML IJ SOSY
50.0000 ug | PREFILLED_SYRINGE | INTRAMUSCULAR | Status: DC | PRN
  Administered 2021-02-06 – 2021-02-08 (×9): 50 ug via INTRAVENOUS
  Filled 2021-02-06 (×10): qty 1

## 2021-02-06 MED ORDER — HYDRALAZINE HCL 50 MG PO TABS
75.0000 mg | ORAL_TABLET | Freq: Three times a day (TID) | ORAL | Status: DC
  Administered 2021-02-06 – 2021-02-12 (×15): 75 mg via ORAL
  Filled 2021-02-06 (×15): qty 1

## 2021-02-06 MED ORDER — MIRTAZAPINE 15 MG PO TABS
15.0000 mg | ORAL_TABLET | Freq: Every day | ORAL | Status: DC
  Administered 2021-02-06 – 2021-02-11 (×6): 15 mg via ORAL
  Filled 2021-02-06 (×7): qty 1

## 2021-02-06 MED ORDER — CINACALCET HCL 30 MG PO TABS
30.0000 mg | ORAL_TABLET | Freq: Every day | ORAL | Status: DC
  Administered 2021-02-06 – 2021-02-10 (×4): 30 mg via ORAL
  Filled 2021-02-06 (×6): qty 1

## 2021-02-06 MED ORDER — SODIUM CHLORIDE 0.9 % IV SOLN
50.0000 mg | INTRAVENOUS | Status: DC
  Filled 2021-02-06: qty 2.5

## 2021-02-06 MED ORDER — IOHEXOL 350 MG/ML SOLN
55.0000 mL | Freq: Once | INTRAVENOUS | Status: AC | PRN
  Administered 2021-02-06: 55 mL via INTRAVENOUS

## 2021-02-06 MED ORDER — AMLODIPINE BESYLATE 10 MG PO TABS
10.0000 mg | ORAL_TABLET | Freq: Every day | ORAL | Status: DC
  Administered 2021-02-06 – 2021-02-12 (×6): 10 mg via ORAL
  Filled 2021-02-06 (×3): qty 1
  Filled 2021-02-06: qty 2
  Filled 2021-02-06 (×2): qty 1

## 2021-02-06 MED ORDER — DOXERCALCIFEROL 2.5 MCG PO CAPS
3.0000 ug | ORAL_CAPSULE | ORAL | Status: DC
  Filled 2021-02-06: qty 1

## 2021-02-06 MED ORDER — CHLORHEXIDINE GLUCONATE CLOTH 2 % EX PADS
6.0000 | MEDICATED_PAD | Freq: Every day | CUTANEOUS | Status: DC
  Administered 2021-02-07: 6 via TOPICAL

## 2021-02-06 MED ORDER — ONDANSETRON HCL 4 MG PO TABS: 4.0000 mg | ORAL_TABLET | Freq: Four times a day (QID) | ORAL | Status: DC | PRN

## 2021-02-06 MED ORDER — ACETAMINOPHEN 325 MG PO TABS
650.0000 mg | ORAL_TABLET | Freq: Four times a day (QID) | ORAL | Status: DC | PRN
  Administered 2021-02-08 – 2021-02-12 (×11): 650 mg via ORAL
  Filled 2021-02-06 (×11): qty 2

## 2021-02-06 MED ORDER — ATORVASTATIN CALCIUM 10 MG PO TABS
20.0000 mg | ORAL_TABLET | Freq: Every day | ORAL | Status: DC
  Administered 2021-02-06 – 2021-02-11 (×6): 20 mg via ORAL
  Filled 2021-02-06 (×6): qty 2

## 2021-02-06 MED ORDER — CLONIDINE HCL 0.1 MG PO TABS
0.1000 mg | ORAL_TABLET | Freq: Two times a day (BID) | ORAL | Status: DC
  Administered 2021-02-06 – 2021-02-12 (×11): 0.1 mg via ORAL
  Filled 2021-02-06 (×11): qty 1

## 2021-02-06 MED ORDER — PANTOPRAZOLE SODIUM 40 MG IV SOLR: 40.0000 mg | Freq: Two times a day (BID) | INTRAVENOUS | Status: DC

## 2021-02-06 MED ORDER — ACETAMINOPHEN 650 MG RE SUPP: 650.0000 mg | Freq: Four times a day (QID) | RECTAL | Status: DC | PRN

## 2021-02-06 MED ORDER — POLYETHYLENE GLYCOL 3350 17 G PO PACK: 17.0000 g | PACK | Freq: Every day | ORAL | Status: DC | PRN

## 2021-02-06 NOTE — Progress Notes (Addendum)
[facility] PROGRESS NOTE   Ashley [name] [id number] DOB: 03-07-1951 DOA: 02/05/2021  PCP: Sonia Side., FNP  Brief History/Interval Summary: 70 year old female with past medical history of end-stage renal disease (MWF HD), hypertension, chronic pancreatitis, hyperlipidemia, previous stroke (2019), nicotine dependence presenting with multiple complaints including of right flank pain nausea vomiting and melena.  Symptoms were associated with nonbilious emesis.  Initially presented on 8/28 to the emergency department.  She underwent CT scan which did not show any acute findings.  And then on 8/29 pain was severe enough that she missed hemodialysis.  And then presented back to the ED.  CT scan was repeated which again did not show any acute findings.  Rectal examination was done by ED provider which showed black stool which was heme positive.  She was hospitalized for further management  Consultants: Nephrology.  Gastroenterology  Procedures: EGD is planned for tomorrow.  Hemodialysis per nephrology  Antibiotics: Anti-infectives (From admission, onward)    None       Subjective/Interval History: Patient complains of pain in the right upper quadrant of abdomen as well as in the right flank area.  Some nausea but no vomiting this morning.  Does not make much urine.     Assessment/Plan:  Melanotic stool/concern for upper GI bleed Patient with several day history of melanotic stool.  She was noted to have black stool on rectal exam which was heme positive.  GI has been consulted.  Plan is for EGD tomorrow.  Continue PPI.  Right-sided flank pain/right upper quadrant pain Etiology is unclear.  Mild elevation in lipase is likely due to history of chronic pancreatitis.  LFTs otherwise normal.  CT scan did not reveal any abnormal findings in the gallbladder.  May be reasonable to do a right upper quadrant ultrasound.  No kidney stones were identified.  Peptic ulcer disease  could be causing referred pain to the right upper quadrant.  This could be musculoskeletal in etiology.  Continue to monitor for now.  End-stage renal disease/hyperkalemia She undergoes dialysis on a Monday Wednesday Friday schedule.  Nephrology has been consulted.  Potassium noted to be elevated.  Plan is for dialysis today.  COVID-19 Noted to be positive for COVID-19.  Does not have any respiratory symptoms.  This would be considered incidental.  We will need to determine if she had known exposure to somebody with COVID-19 recently.  We will also call the lab to find out the cycle threshold.  Hypertensive urgency Blood pressure significantly elevated in the ED most likely due to pain issues.  Started back on her medications including amlodipine clonidine hydralazine and metoprolol.  Continue to monitor.  Hyperlipidemia Continue statin.  Chronic pancreatitis Apparently confirmed by EUS in 2021.  Anemia of chronic disease Actually close to her baseline.  Transfuse if less than 7.  DVT Prophylaxis: SCDs Code Status: Full code Family Communication: Discussed with patient Disposition Plan: Hopefully return home when improved  Status is: Observation  The patient will require care spanning > 2 midnights and should be moved to inpatient because: Ongoing diagnostic testing needed not appropriate for outpatient work up and Inpatient level of care appropriate due to severity of illness  Dispo: The patient is from: Home              Anticipated d/c is to: Home              Patient currently is not medically stable to d/c.   Difficult to place patient  No         Medications: Scheduled:  amLODipine  10 mg Oral Q1200   atorvastatin  20 mg Oral QHS   Chlorhexidine Gluconate Cloth  6 each Topical Q0600   cinacalcet  30 mg Oral QHS   cloNIDine  0.1 mg Oral BID   doxercalciferol  3 mcg Oral Q M,W,F-HD   hydrALAZINE  75 mg Oral Q8H   lidocaine  1 patch Transdermal Q24H   metoprolol  succinate  12.5 mg Oral Daily   mirtazapine  15 mg Oral QHS   [START ON 02/09/2021] pantoprazole  40 mg Intravenous Q12H   sevelamer carbonate  800 mg Oral TID WC   Continuous:  iron sucrose Stopped (02/06/21 1214)   [id number] **OR** acetaminophen, fentaNYL (SUBLIMAZE) injection **OR** fentaNYL (SUBLIMAZE) injection, labetalol, ondansetron **OR** ondansetron (ZOFRAN) IV, polyethylene glycol   Objective:  Vital Signs  Vitals:   02/06/21 0700 02/06/21 0900 02/06/21 1200 02/06/21 1300  BP: (!) 180/86 139/69 (!) 160/67 (!) 158/65  Pulse: 63 61 61   Resp:  '13 15 11  '$ Temp:    98.6 F (37 C)  TempSrc:    Oral  SpO2: 97% 97% 98% 97%  Weight:      Height:       No intake or output data in the 24 hours ending 02/06/21 1346 Filed Weights   02/06/21 0227  Weight: 53.1 kg    General appearance: Awake alert.  In no distress Resp: Clear to auscultation bilaterally.  Normal effort Cardio: S1-S2 is normal regular.  No S3-S4.  No rubs murmurs or bruit GI: Abdomen is soft.  Tenderness appreciated in the right upper quadrant.  Murphy sign equivocal.  No masses organomegaly.  Bowel sounds present.   Extremities: No edema.  Full range of motion of lower extremities. Neurologic: Alert and oriented x3.  No focal neurological deficits.    Lab Results:  Data Reviewed: I have personally reviewed following labs and imaging studies  CBC: Recent Labs  Lab 02/03/21 1632 02/05/21 2355 02/06/21 0929  WBC 6.0 6.6 5.7  NEUTROABS 3.5 3.7  --   HGB 12.2 9.8* 9.0*  HCT 37.4 31.2* 28.4*  MCV 89.0 90.4 89.3  PLT 323 274 [id number]    Basic Metabolic Panel: Recent Labs  Lab 02/03/21 1632 02/05/21 2047 02/06/21 0929  NA 137 137 137  K 4.6 5.5* 5.3*  CL 94* 94* 97*  CO2 '26 22 24  '$ GLUCOSE 93 87 87  BUN 48* 88* 102*  CREATININE 13.33* 17.59* 18.72*  CALCIUM 10.0 10.2 9.1  MG  --   --  2.4  PHOS  --   --  5.6*    GFR: Estimated Creatinine Clearance: 2.1 mL/min (A) (by C-G formula based  on SCr of 18.72 mg/dL (H)).  Liver Function Tests: Recent Labs  Lab 02/03/21 1632 02/05/21 2047 02/06/21 0929  AST '22 19 19  '$ ALT 9 7 <5  ALKPHOS 45 50 36*  BILITOT 0.5 1.1 0.9  PROT 7.9 8.0 6.0*  ALBUMIN 3.9 4.1 3.0*    Recent Labs  Lab 02/03/21 1632 02/05/21 2047  LIPASE 81* 59*     Cardiac Enzymes: Recent Labs  Lab 02/06/21 0929  CKTOTAL 42      Recent Results (from the past 240 hour(s))  SARS CORONAVIRUS 2 (TAT 6-24 HRS) Nasopharyngeal Nasopharyngeal Swab     Status: Abnormal   Collection Time: 02/06/21 12:05 AM   Specimen: Nasopharyngeal Swab  Result Value Ref Range Status   SARS  Coronavirus 2 POSITIVE (A) NEGATIVE Final    Comment: (NOTE) SARS-CoV-2 target nucleic acids are DETECTED.  The SARS-CoV-2 RNA is generally detectable in upper and lower respiratory specimens during the acute phase of infection. Positive results are indicative of the presence of SARS-CoV-2 RNA. Clinical correlation with patient history and other diagnostic information is  necessary to determine patient infection status. Positive results do not rule out bacterial infection or co-infection with other viruses.  The expected result is Negative.  Fact Sheet for Patients: SugarRoll.be  Fact Sheet for Healthcare Providers: https://www.woods-mathews.com/  This test is not yet approved or cleared by the [place] FDA and  has been authorized for detection and/or diagnosis of SARS-CoV-2 by FDA under an Emergency Use Authorization (EUA). This EUA will remain  in effect (meaning this test can be used) for the duration of the COVID-19 declaration under Section 564(b)(1) of the Act, 21 U. S.C. section 360bbb-3(b)(1), unless the authorization is terminated or revoked sooner.   Performed at [facility], [place] [address], [place], [place] [address]       Radiology Studies: CT ABDOMEN PELVIS W CONTRAST  Result Date:  02/06/2021 CLINICAL DATA:  Right lower quadrant pain 1 dark stool EXAM: CT ABDOMEN AND PELVIS WITH CONTRAST TECHNIQUE: Multidetector CT imaging of the abdomen and pelvis was performed using the standard protocol following bolus administration of intravenous contrast. CONTRAST:  75m OMNIPAQUE IOHEXOL 350 MG/ML SOLN COMPARISON:  02/03/2021 FINDINGS: Lower chest: Lung bases are clear. Hepatobiliary: Liver is within normal limits. Vicarious excretion of contrast within the gallbladder. No intrahepatic or extrahepatic ductal dilatation. Pancreas: Within normal limits. Spleen: Within normal limits. Adrenals/Urinary Tract: Adrenal glands are within normal limits. Bilateral renal cortical atrophy with small renal cysts measuring up to 9 mm in the right lower kidney (series 4/image 27). No hydronephrosis. Excretory contrast in the bladder. Stomach/Bowel: Stomach is within normal limits. No evidence of bowel obstruction. Normal appendix (series 4/image 39). No colonic wall thickening or inflammatory changes. Vascular/Lymphatic: No evidence of abdominal aortic aneurysm. Atherosclerotic calcifications of the abdominal aorta and branch vessels. No suspicious abdominopelvic lymphadenopathy. Reproductive: Status post hysterectomy. Left ovary is within normal limits.  No right adnexal mass. Other: No abdominopelvic ascites. Musculoskeletal: Mild degenerative changes of the lower lumbar spine. IMPRESSION: No evidence of bowel obstruction. Normal appendix. No colonic wall thickening or inflammatory changes. No interval change from recent CT. Electronically Signed   By: SJulian HyM.D.   On: 02/06/2021 01:02       LOS: 0 days   [facility] Pager on [email or web address]  02/06/2021, 1:46 PM

## 2021-02-06 NOTE — Consult Note (Addendum)
Referring Provider:  [facility]         Primary Care Physician:  Sonia Side., FNP Primary Gastroenterologist:  Zenovia Jarred, MD   Reason for Consultation:    GI bleed               ASSESSMENT / PLAN   # 70 yo female multiple medical problems admitted with with nausea / vomiting, right flank pain, and  black heme + stools. Two CT scan w/ contrast within last three days in ED were unremarkable, etology of right low flank pain is unclear. 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  --Hemodynamically stable. Will likely need an EGD tomorrow ( hopefully she will dialyze today). The risks and benefits of EGD with possible biopsies were discussed with the patient who agrees to proceed.  --Continue BID IV PPI --Trend CBC --Clear liquids today, NPO after MN okay from GI standpoint.   # Chronic [place] anemia. Hgb is 9 which is at baseline. I think the 12.1 hgb in ED a couple of days ago was a spurious result.  ( unless recently transfused at HD)  # Very remote history of perforated PUD  # Chronic pancreatitis, confirmed by EUS Aug 2021  # Hepatitis C, treated with Epculsa. Negative HCV quant in Aug 2021  # ESRD on HD  MWF. Nephrology aware patient is here.    HISTORY OF PRESENT ILLNESS                                                                                                                         Chief Complaint: black stool, nausea / vomiting  Ashley [name] is a 70 y.o. female with a past medical history significant for recurrent pancreatitis of unclear etiology, remote perforated PUD, Hepatitis C ( treated), chronic pancreatitis, ESRD on HD M/W/F,  hx of polysubstance abuse, HTN, hyperlipidemia, CVA, hysterectomy.  See PMH for any additional medical history.   Patient saw [place] in 2020 for evaluation of recurrent pancreatitis. EUS in Aug 2021 showed chronic pancreatitis.  She missed follow up  appt in Oct 2021 and hasn't been seen since.    ED course:  Patient presented to ED on 8/28 with right flank / abdominal pain. WBC normla. Hgb 12.2, lipase 48.  CTAP w/ contrast was unrevealing. Felt to have musculoskeletal pain and discharged home. Patient came back to ED 8/30 with ongoing right sided abdominal pain, nausea / vomiting and onset of dark stools. WBC 6.6, hgb 9.8, MCV 90, K+ 5.5, liver chemistries normal. Lipase 59.  INR 1.1, FOBT +. Repeat CTAP w/ contrast was unrevealing.   Ms Bengtson says the pain in her lower right side started two weeks ago. It has been constant and unrelated to movement, meals or BMs. No recent injury to area. She has never had this type of pain before. Yesterday she began having nausea / vomiting (  non-bloody emesis) and black stool. No bismuth or iron use. She doesn't take NSAIDS. Pantoprazole on home med. She denies history of GERD.     SIGNIFICANT DIAGNOTIC STUDIES    02/03/21 CTAP w/ contrast for right flank / abd pain  --no acute findings  02/06/21 CTAP w/ contrast  for persistent right flank and abdominal pain  No evidence of bowel obstruction. Normal appendix. No colonic wall thickening or inflammatory changes. No interval change from recent CT.  PREVIOUS ENDOSCOPIC EVALUATIONS    March 2021 EGD for abdominal pain and peripancreatic inflammation on CT scan --Erosive gastropathy with no stigmata of recent bleeding. Biopsied. - Duodenitis. Biopsied. - A single non-bleeding angiodysplastic lesion in the duodenum. Treated with argon plasma coagulation (APC). Thought reasonable to ablate as at higher risk of bleeding given comorbidities - The examination was otherwise normal.  FINAL MICROSCOPIC DIAGNOSIS:   A. DUODENUM, BIOPSY:  -  Peptic duodenitis  -  No dysplasia or malignancy identified   B. STOMACH, BIOPSY:  -  Chronic gastritis with intestinal metaplasia  -  No H. pylori or malignancy identified  -  See comment    Aug 2021 EUS --Upper  endoscopy was normal.  --Impressive parenchymal and ductal changes that are consistent with chronic pancreatitis throughout the gland.   Patient has never had a colonoscopy    Past Medical History:  Diagnosis Date   Acute pancreatitis 2000   2000, 12/2018, 08/2019   Arthritis    Cervical radiculopathy 02/28/2011   Cocaine substance abuse ([place]) 05/26/2013   positive UDS    Duodenitis    Erosive gastropathy    ESRD on hemodialysis (HCC)    TTS   GERD (gastroesophageal reflux disease)    Hepatitis C 1987   dt hx IVDA.  genotype 2B.  Epclusa started early 04/2020.     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  Chronic microvascular ischemic changes w parenchymal volume loss. Chronic white matter periventricular microhemorrhage, likely due to htn   Ulcer 1990   gastric ulcer. Ruptured s/p emergency repair    Past Surgical History:  Procedure Laterality Date   ABDOMINAL HYSTERECTOMY  1979   AV FISTULA PLACEMENT Left 06/16/2017   Procedure: ARTERIOVENOUS (AV) FISTULA CREATION LEFT ARM;  Surgeon: Conrad Floraville, MD;  Location: [facility];  Service: Vascular;  Laterality: Left;   [place] Left 10/02/2017   Procedure: BASILIC VEIN TRANSPOSITION SECOND STAGE LEFT ARM;  Surgeon: Rosetta Posner, MD;  Location: [facility];  Service: Vascular;  Laterality: Left;   BIOPSY  09/06/2019   Procedure: BIOPSY;  Surgeon: Gatha Mayer, MD;  Location: [facility] ENDOSCOPY;  Service: Endoscopy;;   ESOPHAGOGASTRODUODENOSCOPY N/A 05/29/2013   Procedure: ESOPHAGOGASTRODUODENOSCOPY (EGD);  Surgeon: Jerene Bears, MD;  Location: [facility] ENDOSCOPY;  Service: Endoscopy;  Laterality: N/A;   ESOPHAGOGASTRODUODENOSCOPY  05/2013   for epigastric pain.  Nonobstructing  Schatzki ring at GEJ, mild gastropathy, nonbleeding AVMs in bulb and D2. 5 mm sessile polyp in bulb.   ESOPHAGOGASTRODUODENOSCOPY (EGD) WITH PROPOFOL N/A 09/06/2019   Procedure: ESOPHAGOGASTRODUODENOSCOPY (EGD) WITH PROPOFOL;  Surgeon: Gatha Mayer, MD;  Location: [facility];  Service: Endoscopy;  Laterality: N/A;   ESOPHAGOGASTRODUODENOSCOPY (EGD) WITH PROPOFOL N/A 02/02/2020  Procedure: ESOPHAGOGASTRODUODENOSCOPY (EGD) WITH PROPOFOL;  Surgeon: [name], MD;  Location: [facility] ENDOSCOPY;  Service: Endoscopy;  Laterality: N/A;   EUS N/A 02/02/2020   Procedure: UPPER ENDOSCOPIC ULTRASOUND (EUS) RADIAL;  Surgeon: [name], MD;  Location: [facility] ENDOSCOPY;  Service: Endoscopy;  Laterality: N/A;   EXCHANGE OF A DIALYSIS CATHETER Left 07/31/2017   Procedure: Removal  OF A  Right GroinTUNNELED  DIALYSIS CATHETER ,  Insertion of Left Femoral Dialysis Catheter.;  Surgeon: Rosetta Posner, MD;  Location: [facility] OR;  Service: Vascular;  Laterality: Left;   HOT HEMOSTASIS N/A 09/06/2019   Procedure: HOT HEMOSTASIS (ARGON PLASMA COAGULATION/BICAP);  Surgeon: Gatha Mayer, MD;  Location: [facility] ENDOSCOPY;  Service: Endoscopy;  Laterality: N/A;   INSERTION OF DIALYSIS CATHETER Right 06/16/2017   Procedure: INSERTION OF DIALYSIS CATHETER;  Surgeon: Conrad Pim[place], MD;  Location: [facility] OR;  Service: Vascular;  Laterality: Right;   IR AV DIALY SHUNT INTRO NEEDLE/INTRACATH INITIAL W/PTA/IMG LEFT  06/21/2018   REPAIR OF PERFORATED ULCER  1990   gastric ulcer    Prior to Admission medications   Medication Sig Start Date End Date Taking? Authorizing Provider  acetaminophen (TYLENOL) 325 MG tablet Take 2 tablets (650 mg total) by mouth every 6 (six) hours as needed for mild pain (or Fever >/= 101). 01/11/20  Yes Little Ishikawa, MD  amLODipine (NORVASC) 10 MG tablet Take 1 tablet (10 mg total) by mouth daily. Patient taking differently: Take 10 mg by mouth daily at 12 noon. 11/11/16  Yes Luiz Blare Y, DO   atorvastatin (LIPITOR) 20 MG tablet Take 20 mg by mouth at bedtime. 08/05/20  Yes [provider]  B Complex-C-Zn-Folic Acid (DIALYVITE Q000111Q WITH ZINC) 0.8 MG TABS Take 1 tablet by mouth daily.  07/07/19  Yes [provider]  cinacalcet (SENSIPAR) 30 MG tablet Take 30 mg by mouth at bedtime.    Yes [provider]  cloNIDine (CATAPRES) 0.1 MG tablet Take 0.1 mg by mouth 3 (three) times daily. 06/25/19  Yes [provider]  hydrALAZINE (APRESOLINE) 25 MG tablet Take 3 tablets (75 mg total) by mouth every 8 (eight) hours. 11/14/20 02/12/21 Yes [place] Eric J, DO  lidocaine-prilocaine (EMLA) cream Apply 1 application topically See admin instructions. Apply small amount to access site on Tuesday, Thursday, Saturday one hour before dialysis. Cover with occlusive dressing (saran wrap) 08/25/19  Yes [provider]  methocarbamol (ROBAXIN) 500 MG tablet Take 1 tablet (500 mg total) by mouth 2 (two) times daily. 02/04/21  Yes Card[place], Grayce Sessions, MD  Methoxy PEG-Epoetin Beta (MIRCERA IJ) Administered at dialysis 09/24/20 09/23/21 Yes [provider]  metoprolol succinate (TOPROL-XL) 25 MG 24 hr tablet Take 0.5 tablets (12.5 mg total) by mouth daily. 11/14/20 02/12/21 Yes [place] Eric J, DO  mirtazapine (REMERON) 15 MG tablet Take 15 mg by mouth at bedtime. 11/02/20  Yes [provider]  pantoprazole (PROTONIX) 40 MG tablet Take 1 tablet (40 mg total) by mouth daily. 11/14/20 02/12/21 Yes [place] Eric J, DO  polyethylene glycol (MIRALAX) 17 g packet Take 17 g by mouth 2 (two) times daily. Patient taking differently: Take 17 g by mouth daily as needed for moderate constipation. 09/14/19  Yes Khatri, Hina, PA-C  sevelamer carbonate (RENVELA) 800 MG tablet Take 800 mg by mouth 3 (three) times daily with meals. 07/23/20  Yes [provider]  ferric citrate (AURYXIA) 1 GM 210 MG(Fe) tablet Take 1 tablet (210 mg total) by mouth 3 (three)  times daily with  meals. Patient not taking: No sig reported 01/11/20   Little Ishikawa, MD  Nutritional Supplements (FEEDING SUPPLEMENT, NEPRO CARB STEADY,) LIQD Take 237 mLs by mouth 3 (three) times daily as needed (Supplement). 07/11/19   Black, Lezlie Octave, NP    Current Facility-Administered Medications  Medication Dose Route Frequency Provider Last Rate Last Admin   acetaminophen (TYLENOL) tablet 650 mg  650 mg Oral Q6H PRN Shalhoub, Sherryll Burger, MD       Or   acetaminophen (TYLENOL) suppository 650 mg  650 mg Rectal Q6H PRN Shalhoub, Sherryll Burger, MD       amLODipine (NORVASC) tablet 10 mg  10 mg Oral Q1200 Shalhoub, Sherryll Burger, MD   10 mg at 02/06/21 0759   atorvastatin (LIPITOR) tablet 20 mg  20 mg Oral QHS Shalhoub, Sherryll Burger, MD       Chlorhexidine Gluconate Cloth 2 % PADS 6 each  6 each Topical Q0600 Valentina Gu, NP       cinacalcet (SENSIPAR) tablet 30 mg  30 mg Oral QHS Shalhoub, Sherryll Burger, MD       cloNIDine (CATAPRES) tablet 0.1 mg  0.1 mg Oral BID Vernelle Emerald, MD   0.1 mg at 02/06/21 0757   doxercalciferol (HECTOROL) capsule 3 mcg  3 mcg Oral Q M,W,F-HD Valentina Gu, NP       fentaNYL (SUBLIMAZE) injection 25 mcg  25 mcg Intravenous Q2H PRN Shalhoub, Sherryll Burger, MD       Or   fentaNYL (SUBLIMAZE) injection 50 mcg  50 mcg Intravenous Q2H PRN Vernelle Emerald, MD   50 mcg at 02/06/21 0252   hydrALAZINE (APRESOLINE) tablet 75 mg  75 mg Oral Q8H Shalhoub, Sherryll Burger, MD   75 mg at 02/06/21 0758   iron sucrose (VENOFER) 50 mg in sodium chloride 0.9 % 100 mL IVPB  50 mg Intravenous Q Wed-HD Valentina Gu, NP       labetalol (NORMODYNE) injection 20 mg  20 mg Intravenous Q2H PRN Shalhoub, Sherryll Burger, MD       lidocaine (LIDODERM) 5 % 1 patch  1 patch Transdermal Q24H Shalhoub, Sherryll Burger, MD   1 patch at 02/06/21 0930   metoprolol succinate (TOPROL-XL) 24 hr tablet 12.5 mg  12.5 mg Oral Daily Shalhoub, Sherryll Burger, MD       mirtazapine (REMERON) tablet 15 mg  15 mg Oral QHS Shalhoub, Sherryll Burger, MD       ondansetron [facility]) tablet 4 mg  4 mg Oral Q6H PRN Shalhoub, Sherryll Burger, MD       Or   ondansetron [facility]) injection 4 mg  4 mg Intravenous Q6H PRN Shalhoub, Sherryll Burger, MD       [START ON 02/09/2021] pantoprazole (PROTONIX) injection 40 mg  40 mg Intravenous Q12H Shalhoub, Sherryll Burger, MD       polyethylene glycol (MIRALAX / GLYCOLAX) packet 17 g  17 g Oral Daily PRN Shalhoub, Sherryll Burger, MD       sevelamer carbonate (RENVELA) tablet 800 mg  800 mg Oral TID WC Shalhoub, Sherryll Burger, MD       Current Outpatient Medications  Medication Sig Dispense Refill   acetaminophen (TYLENOL) 325 MG tablet Take 2 tablets (650 mg total) by mouth every 6 (six) hours as needed for mild pain (or Fever >/= 101).     amLODipine (NORVASC) 10 MG tablet Take 1 tablet (10 mg total) by mouth daily. (Patient taking differently: Take  10 mg by mouth daily at 12 noon.) 90 tablet 1   atorvastatin (LIPITOR) 20 MG tablet Take 20 mg by mouth at bedtime.     B Complex-C-Zn-Folic Acid (DIALYVITE Q000111Q WITH ZINC) 0.8 MG TABS Take 1 tablet by mouth daily.      cinacalcet (SENSIPAR) 30 MG tablet Take 30 mg by mouth at bedtime.      cloNIDine (CATAPRES) 0.1 MG tablet Take 0.1 mg by mouth 3 (three) times daily.     hydrALAZINE (APRESOLINE) 25 MG tablet Take 3 tablets (75 mg total) by mouth every 8 (eight) hours. 270 tablet 2   lidocaine-prilocaine (EMLA) cream Apply 1 application topically See admin instructions. Apply small amount to access site on Tuesday, Thursday, Saturday one hour before dialysis. Cover with occlusive dressing (saran wrap)     methocarbamol (ROBAXIN) 500 MG tablet Take 1 tablet (500 mg total) by mouth 2 (two) times daily. 20 tablet 0   Methoxy PEG-Epoetin Beta (MIRCERA IJ) Administered at dialysis     metoprolol succinate (TOPROL-XL) 25 MG 24 hr tablet Take 0.5 tablets (12.5 mg total) by mouth daily. 15 tablet 2   mirtazapine (REMERON) 15 MG tablet Take 15 mg by mouth at bedtime.     pantoprazole (PROTONIX) 40 MG  tablet Take 1 tablet (40 mg total) by mouth daily. 30 tablet 2   polyethylene glycol (MIRALAX) 17 g packet Take 17 g by mouth 2 (two) times daily. (Patient taking differently: Take 17 g by mouth daily as needed for moderate constipation.) 14 each 0   sevelamer carbonate (RENVELA) 800 MG tablet Take 800 mg by mouth 3 (three) times daily with meals.     ferric citrate (AURYXIA) 1 GM 210 MG(Fe) tablet Take 1 tablet (210 mg total) by mouth 3 (three) times daily with meals. (Patient not taking: No sig reported) 270 tablet 1   Nutritional Supplements (FEEDING SUPPLEMENT, NEPRO CARB STEADY,) LIQD Take 237 mLs by mouth 3 (three) times daily as needed (Supplement).  0    Allergies as of 02/05/2021 - Review Complete 02/05/2021  Allergen Reaction Noted   Aspirin Nausea And Vomiting 07/18/2009   Ibuprofen Nausea And Vomiting 12/24/2009    Family History  Problem Relation Age of Onset   Hypertension Father    Cancer Father    Hyperlipidemia Father    Seizures Sister    Early death Daughter    Kidney disease Daughter        end stage dialysis dependent     Social History   Socioeconomic History   Marital status: Widowed    Spouse name: Not on file   Number of children: 3   Years of education: Not on file   Highest education level: Not on file  Occupational History   Occupation: retired  Tobacco Use   Smoking status: Every Day    Packs/day: 0.25    Years: 40.00    Pack years: 10.00    Types: Cigarettes   Smokeless tobacco: Never  Vaping Use   Vaping Use: Never used  Substance and Sexual Activity   Alcohol use: No    Alcohol/week: 0.0 standard drinks   Drug use: Not Currently    Types: Heroin, Marijuana, Cocaine    Comment: hasn't used cocaine in 1-2 years; she smokes marijuana daily, "whenever I can get it"   Sexual activity: Never  Other Topics Concern   Not on file  Social History Narrative   Not on file   Social Determinants of Health  Financial Resource Strain: Not on  file  Food Insecurity: Not on file  Transportation Needs: Not on file  Physical Activity: Not on file  Stress: Not on file  Social Connections: Not on file  Intimate Partner Violence: Not on file    Review of Systems: All systems reviewed and negative except where noted in HPI.   OBJECTIVE    Physical Exam: Vital signs in last 24 hours: Temp:  [99.5 F (37.5 C)] 99.5 F (37.5 C) (08/30 1751) Pulse Rate:  [60-83] 61 (08/31 0900) Resp:  [11-22] 13 (08/31 0900) BP: (139-210)/(69-90) 139/69 (08/31 0900) SpO2:  [95 %-100 %] 97 % (08/31 0900) Weight:  [53.1 kg] 53.1 kg (08/31 0227)   General:   Alert  female in NAD Psych:  Pleasant, cooperative. Normal mood and affect. Eyes:  Pupils equal, sclera clear, no icterus.   Conjunctiva pink. Ears:  Normal auditory acuity. Nose:  No deformity, discharge,  or lesions. Neck:  Supple; no masses Lungs:  Clear throughout to auscultation.   No wheezes, crackles, or rhonchi.  Heart:  Regular rate and rhythm;  no lower extremity edema Abdomen:  Soft, non-distended, nontender, BS active, no palp mass   Rectal:  Deferred  Msk:  Symmetrical without gross deformities. . Neurologic:  Alert and  oriented x4;  grossly normal neurologically. Skin:  Intact without significant lesions or rashes. Lidoderm patch on her lower right side ( above iliac crest)   Scheduled inpatient medications  amLODipine  10 mg Oral Q1200   atorvastatin  20 mg Oral QHS   Chlorhexidine Gluconate Cloth  6 each Topical Q0600   cinacalcet  30 mg Oral QHS   cloNIDine  0.1 mg Oral BID   doxercalciferol  3 mcg Oral Q M,W,F-HD   hydrALAZINE  75 mg Oral Q8H   lidocaine  1 patch Transdermal Q24H   metoprolol succinate  12.5 mg Oral Daily   mirtazapine  15 mg Oral QHS   [START ON 02/09/2021] pantoprazole  40 mg Intravenous Q12H   sevelamer carbonate  800 mg Oral TID WC      Intake/Output from previous day: No intake/output data recorded. Intake/Output this shift: No  intake/output data recorded.   Lab Results: Recent Labs    02/03/21 1632 02/05/21 2355 02/06/21 0929  WBC 6.0 6.6 5.7  HGB 12.2 9.8* 9.0*  HCT 37.4 31.2* 28.4*  PLT 323 274 280   BMET Recent Labs    02/03/21 1632 02/05/21 2047 02/06/21 0929  NA 137 137 137  K 4.6 5.5* 5.3*  CL 94* 94* 97*  CO2 '26 22 24  '$ GLUCOSE 93 87 87  BUN 48* 88* 102*  CREATININE 13.33* 17.59* 18.72*  CALCIUM 10.0 10.2 9.1   LFT Recent Labs    02/06/21 0929  PROT 6.0*  ALBUMIN 3.0*  AST 19  ALT <5  ALKPHOS 36*  BILITOT 0.9   PT/INR No results for input(s): LABPROT, INR in the last 72 hours. Hepatitis Panel No results for input(s): HEPBSAG, HCVAB, HEPAIGM, HEPBIGM in the last 72 hours.   . CBC Latest Ref Rng & Units 02/06/2021 02/05/2021 02/03/2021  WBC 4.0 - 10.5 K/uL 5.7 6.6 6.0  Hemoglobin 12.0 - 15.0 g/dL 9.0(L) 9.8(L) 12.2  Hematocrit 36.0 - 46.0 % 28.4(L) 31.2(L) 37.4  Platelets 150 - 400 K/uL 280 274 323    . CMP Latest Ref Rng & Units 02/06/2021 02/05/2021 02/03/2021  Glucose 70 - 99 mg/dL 87 87 93  BUN 8 - 23 mg/dL 102(H) 88(H)  48(H)  Creatinine 0.44 - 1.00 mg/dL 18.72(H) 17.59(H) 13.33(H)  Sodium 135 - 145 mmol/L 137 137 137  Potassium 3.5 - 5.1 mmol/L 5.3(H) 5.5(H) 4.6  Chloride 98 - 111 mmol/L 97(L) 94(L) 94(L)  CO2 22 - 32 mmol/L '24 22 26  '$ Calcium 8.9 - 10.3 mg/dL 9.1 10.2 10.0  Total Protein 6.5 - 8.1 g/dL 6.0(L) 8.0 7.9  Total Bilirubin 0.3 - 1.2 mg/dL 0.9 1.1 0.5  Alkaline Phos 38 - 126 U/L 36(L) 50 45  AST 15 - 41 U/L '19 19 22  '$ ALT 0 - 44 U/L '5 7 9   '$ Studies/Results: CT ABDOMEN PELVIS W CONTRAST  Result Date: 02/06/2021 CLINICAL DATA:  Right lower quadrant pain 1 dark stool EXAM: CT ABDOMEN AND PELVIS WITH CONTRAST TECHNIQUE: Multidetector CT imaging of the abdomen and pelvis was performed using the standard protocol following bolus administration of intravenous contrast. CONTRAST:  83m OMNIPAQUE IOHEXOL 350 MG/ML SOLN COMPARISON:  02/03/2021 FINDINGS: Lower  chest: Lung bases are clear. Hepatobiliary: Liver is within normal limits. Vicarious excretion of contrast within the gallbladder. No intrahepatic or extrahepatic ductal dilatation. Pancreas: Within normal limits. Spleen: Within normal limits. Adrenals/Urinary Tract: Adrenal glands are within normal limits. Bilateral renal cortical atrophy with small renal cysts measuring up to 9 mm in the right lower kidney (series 4/image 27). No hydronephrosis. Excretory contrast in the bladder. Stomach/Bowel: Stomach is within normal limits. No evidence of bowel obstruction. Normal appendix (series 4/image 39). No colonic wall thickening or inflammatory changes. Vascular/Lymphatic: No evidence of abdominal aortic aneurysm. Atherosclerotic calcifications of the abdominal aorta and branch vessels. No suspicious abdominopelvic lymphadenopathy. Reproductive: Status post hysterectomy. Left ovary is within normal limits.  No right adnexal mass. Other: No abdominopelvic ascites. Musculoskeletal: Mild degenerative changes of the lower lumbar spine. IMPRESSION: No evidence of bowel obstruction. Normal appendix. No colonic wall thickening or inflammatory changes. No interval change from recent CT. Electronically Signed   By: SJulian HyM.D.   On: 02/06/2021 01:02     PTye Savoy NP-C @  02/06/2021, 11:16 AM   ________________________________________________________________________  LVelora HecklerGI MD note:  Given her Covid + I did not enter her room this after however I reviewed the data and agree with the assessment and plan described above.  She has right flank pain that is unlikely to be a gastrointestinal issue. She has had vomiting and black stools that may represent some GI bleeding. I doubt ongoing, active bleeding and am planning further testing with EGD tomorrow, it must be last case of the day given her covid + testing.  DOwens Loffler MD LGood Samaritan HospitalGastroenterology Pager [phone]

## 2021-02-06 NOTE — ED Notes (Addendum)
Pt not on cardiac monitor upon arrival to pts room. Will attempt to find cardiac leads to monitor pt

## 2021-02-06 NOTE — ED Notes (Signed)
Patient transported to CT 

## 2021-02-06 NOTE — H&P (Signed)
History and Physical    Ashley [name] [id number] DOB: 03-20-1951 DOA: 02/05/2021  PCP: Sonia Side., FNP  Patient coming from: Home via EMS   Chief Complaint:  Chief Complaint  Patient presents with   Emesis     HPI:    70 year old female with past medical history of end-stage renal disease (MWF HD), hypertension, chronic pancreatitis, hyperlipidemia, previous stroke (2019), nicotine dependence presenting with multiple complaints including of right flank pain nausea vomiting and melena.  Patient explains that approximately 3 days ago she began to experience bouts of intense nausea.  This was followed by several episodes of nonbilious nonbloody vomiting.  However, around the same time the symptoms started patient began to experience severe right flank pain patient describes his pain as sharp in quality, severe in intensity, radiating to the right side of the abdomen.  Pain is worse with movement and improved with lying still.    Patient's pain was so severe that she presented to [facility] 8/28 for evaluation.  During this evaluation CT imaging of the abdomen pelvis was performed which did not identify any acute abnormality.  Patient was found to have a hemoglobin of 12.2.  During that visit.  Patient was discharged home.  The following day on 8/29 the patient states that her pain was so severe that she missed her session of hemodialysis.  Patient explains that her pain continued to persist in the days that followed and became associated with bouts of melena.  Patient describes that she has had at least 3 episodes of melena daily for the past 2 days.  This is been associated with ongoing nausea but denies any further episodes of vomiting as she had initially.  Patient denies any recent NSAID use or alcohol use.  Patient denies any fever, sick contacts, recent travel, recent ingestion of undercooked food.  Patient's symptoms of ongoing melena and severe right abdominal and  flank pain continue to persist until she presented once again to [facility] emergency department via EMS for evaluation.  Upon evaluation in the emergency department repeat CT imaging of the abdomen pelvis once again revealed no evidence of acute disease.  Rectal exam did reveal gross melena however with stool Hemoccult being positive.  Patient continued to experience severe right-sided flank pain throughout the emergency department stay requiring several doses of opiate-based analgesics.  Chemistry revealed mild hyperkalemia 5.5.  CBC revealed a hemoglobin 9.8, troponin 12.2 on 8/28.  Due to concerns for ongoing melena and blood loss anemia as well as continued intractable right flank pain the hospitalist group has been called to assess the patient for admission to the hospital.  Review of Systems:   Review of Systems  Gastrointestinal:  Positive for abdominal pain, melena, nausea and vomiting.  All other systems reviewed and are negative.  Past Medical History:  Diagnosis Date   Acute pancreatitis 2000   2000, 12/2018, 08/2019   Arthritis    Cervical radiculopathy 02/28/2011   Cocaine substance abuse ([place]) 05/26/2013   positive UDS    Duodenitis    Erosive gastropathy    ESRD on hemodialysis (HCC)    TTS   GERD (gastroesophageal reflux disease)    Hepatitis C 1987   dt hx IVDA.  genotype 2B.  Epclusa started early 04/2020.     Hiatal hernia    Hyperlipidemia 2015   Hypertension 2008   Marijuana abuse 05/27/2003   positive UDS, family members smoke as well   Pancreatitis  Progressive focal motor weakness 06/14/2017   Schatzki's ring    Stroke ([place]) 06/2017   MRI:MRI: small, subacute left internal capsule infarct.  Chronic microvascular ischemic changes w parenchymal volume loss. Chronic white matter periventricular microhemorrhage, likely due to htn   Ulcer 1990   gastric ulcer. Ruptured s/p emergency repair    Past Surgical History:  Procedure Laterality Date    ABDOMINAL HYSTERECTOMY  1979   AV FISTULA PLACEMENT Left 06/16/2017   Procedure: ARTERIOVENOUS (AV) FISTULA CREATION LEFT ARM;  Surgeon: Conrad Edenburg, MD;  Location: [place];  Service: Vascular;  Laterality: Left;   [place] Left 10/02/2017   Procedure: BASILIC VEIN TRANSPOSITION SECOND STAGE LEFT ARM;  Surgeon: Rosetta Posner, MD;  Location: [place];  Service: Vascular;  Laterality: Left;   BIOPSY  09/06/2019   Procedure: BIOPSY;  Surgeon: Gatha Mayer, MD;  Location: [facility] ENDOSCOPY;  Service: Endoscopy;;   ESOPHAGOGASTRODUODENOSCOPY N/A 05/29/2013   Procedure: ESOPHAGOGASTRODUODENOSCOPY (EGD);  Surgeon: Jerene Bears, MD;  Location: [facility] ENDOSCOPY;  Service: Endoscopy;  Laterality: N/A;   ESOPHAGOGASTRODUODENOSCOPY  05/2013   for epigastric pain.  Nonobstructing Schatzki ring at GEJ, mild gastropathy, nonbleeding AVMs in bulb and D2. 5 mm sessile polyp in bulb.   ESOPHAGOGASTRODUODENOSCOPY (EGD) WITH PROPOFOL N/A 09/06/2019   Procedure: ESOPHAGOGASTRODUODENOSCOPY (EGD) WITH PROPOFOL;  Surgeon: Gatha Mayer, MD;  Location: [place];  Service: Endoscopy;  Laterality: N/A;   ESOPHAGOGASTRODUODENOSCOPY (EGD) WITH PROPOFOL N/A 02/02/2020   Procedure: ESOPHAGOGASTRODUODENOSCOPY (EGD) WITH PROPOFOL;  Surgeon: [name], MD;  Location: [facility] ENDOSCOPY;  Service: Endoscopy;  Laterality: N/A;   EUS N/A 02/02/2020   Procedure: UPPER ENDOSCOPIC ULTRASOUND (EUS) RADIAL;  Surgeon: [name], MD;  Location: [facility] ENDOSCOPY;  Service: Endoscopy;  Laterality: N/A;   EXCHANGE OF A DIALYSIS CATHETER Left 07/31/2017   Procedure: Removal  OF A  Right GroinTUNNELED  DIALYSIS CATHETER ,  Insertion of Left Femoral Dialysis Catheter.;  Surgeon: Rosetta Posner, MD;  [facility];  Service: Vascular;  Laterality: Left;   HOT HEMOSTASIS N/A 09/06/2019   Procedure: HOT HEMOSTASIS (ARGON PLASMA COAGULATION/BICAP);  Surgeon: Gatha Mayer, MD;  Location: [facility] ENDOSCOPY;  Service: Endoscopy;  Laterality:  N/A;   INSERTION OF DIALYSIS CATHETER Right 06/16/2017   Procedure: INSERTION OF DIALYSIS CATHETER;  Surgeon: Conrad Pinckney[place], MD;  Location: [place];  Service: Vascular;  Laterality: Right;   IR AV DIALY SHUNT INTRO NEEDLE/INTRACATH INITIAL W/PTA/IMG LEFT  06/21/2018   REPAIR OF PERFORATED ULCER  1990   gastric ulcer     reports that she has been smoking cigarettes. She has a 10.00 pack-year smoking history. She has never used smokeless tobacco. She reports that she does not currently use drugs after having used the following drugs: Heroin, Marijuana, and Cocaine. She reports that she does not drink alcohol.  Allergies  Allergen Reactions   Aspirin Nausea And Vomiting    Stomach ache   Ibuprofen Nausea And Vomiting    Stomach ache    Family History  Problem Relation Age of Onset   Hypertension Father    Cancer Father    Hyperlipidemia Father    Seizures Sister    Early death Daughter    Kidney disease Daughter        end stage dialysis dependent      Prior to Admission medications   Medication Sig Start Date End Date Taking? Authorizing Provider  acetaminophen (TYLENOL) 325 MG tablet Take 2 tablets (650 mg  total) by mouth every 6 (six) hours as needed for mild pain (or Fever >/= 101). 01/11/20   Little Ishikawa, MD  amLODipine (NORVASC) 10 MG tablet Take 1 tablet (10 mg total) by mouth daily. Patient taking differently: Take 10 mg by mouth daily at 12 noon. 11/11/16   Katheren Shams, DO  atorvastatin (LIPITOR) 20 MG tablet Take 20 mg by mouth at bedtime. 08/05/20   [provider]  B Complex-C-Zn-Folic Acid (DIALYVITE Q000111Q WITH ZINC) 0.8 MG TABS Take 1 tablet by mouth daily.  07/07/19   [provider]  cinacalcet (SENSIPAR) 30 MG tablet Take 30 mg by mouth at bedtime.     [provider]  cloNIDine (CATAPRES) 0.1 MG tablet Take 0.1 mg by mouth 2 (two) times daily. 06/25/19   [provider]  ferric citrate (AURYXIA) 1 GM 210 MG(Fe) tablet Take 1  tablet (210 mg total) by mouth 3 (three) times daily with meals. Patient taking differently: Take 420 mg by mouth 3 (three) times daily with meals. 01/11/20   Little Ishikawa, MD  hydrALAZINE (APRESOLINE) 25 MG tablet Take 3 tablets (75 mg total) by mouth every 8 (eight) hours. 11/14/20 02/12/21  [place] Donnamarie Poag, DO  lidocaine-prilocaine (EMLA) cream Apply 1 application topically See admin instructions. Apply small amount to access site on Tuesday, Thursday, Saturday one hour before dialysis. Cover with occlusive dressing (saran wrap) 08/25/19   [provider]  methocarbamol (ROBAXIN) 500 MG tablet Take 1 tablet (500 mg total) by mouth 2 (two) times daily. 02/04/21   Fatima Blank, MD  Methoxy PEG-Epoetin Beta (MIRCERA IJ) Administered at dialysis 09/24/20 09/23/21  [provider]  metoprolol succinate (TOPROL-XL) 25 MG 24 hr tablet Take 0.5 tablets (12.5 mg total) by mouth daily. 11/14/20 02/12/21  [place] Donnamarie Poag, DO  mirtazapine (REMERON) 15 MG tablet Take 15 mg by mouth at bedtime. 11/02/20   [provider]  Nutritional Supplements (FEEDING SUPPLEMENT, NEPRO CARB STEADY,) LIQD Take 237 mLs by mouth 3 (three) times daily as needed (Supplement). Patient not taking: Reported on 11/10/2020 07/11/19   Radene Gunning, NP  pantoprazole (PROTONIX) 40 MG tablet Take 1 tablet (40 mg total) by mouth daily. 11/14/20 02/12/21  [place] Donnamarie Poag, DO  polyethylene glycol (MIRALAX) 17 g packet Take 17 g by mouth 2 (two) times daily. Patient taking differently: Take 17 g by mouth daily as needed for moderate constipation. 09/14/19   Khatri, Hina, PA-C  sevelamer carbonate (RENVELA) 800 MG tablet Take 800 mg by mouth 3 (three) times daily. 07/23/20   [provider]    Physical Exam: Vitals:   02/06/21 0227 02/06/21 0400 02/06/21 0600 02/06/21 0700  BP:  (!) 183/76 (!) 192/77 (!) 180/86  Pulse:  65 60 63  Resp:  18 16   Temp:      TempSrc:      SpO2:  98% 99% 97%  Weight: 53.1 kg      Height: '5\' 2"'$  (1.575 m)       Constitutional: Awake alert and oriented x3, patient is in distress due to pain. Skin: no rashes, no lesions, good skin turgor noted. Eyes: Pupils are equally reactive to light.  No evidence of scleral icterus or conjunctival pallor.  ENMT: Moist mucous membranes noted.  Posterior pharynx clear of any exudate or lesions.   Neck: normal, supple, no masses, no thyromegaly.  No evidence of jugular venous distension.   Respiratory: clear to auscultation bilaterally, no wheezing, no  crackles. Normal respiratory effort. No accessory muscle use.  Cardiovascular: Regular rate and rhythm, no murmurs / rubs / gallops. No extremity edema. 2+ pedal pulses. No carotid bruits.  Chest:   Nontender without crepitus or deformity.   Back:   Exquisite right flank tenderness without evidence of crepitus or deformity.   Abdomen: Additional notable tenderness in the right upper and right lower quadrants.  Abdomen is soft however with out evidence of intra-abdominal masses.  Positive bowel sounds noted in all quadrants.   Musculoskeletal: No joint deformity upper and lower extremities. Good ROM, no contractures. Normal muscle tone.  Neurologic: CN 2-12 grossly intact. Sensation intact.  Patient moving all 4 extremities spontaneously.  Patient is following all commands.  Patient is responsive to verbal stimuli.   Psychiatric: Patient exhibits normal mood with appropriate affect.  Patient seems to possess insight as to their current situation.     Labs on Admission: I have personally reviewed following labs and imaging studies -   CBC: Recent Labs  Lab 02/03/21 1632 02/05/21 2355  WBC 6.0 6.6  NEUTROABS 3.5 3.7  HGB 12.2 9.8*  HCT 37.4 31.2*  MCV 89.0 90.4  PLT 323 [id number]   Basic Metabolic Panel: Recent Labs  Lab 02/03/21 1632 02/05/21 2047  NA 137 137  K 4.6 5.5*  CL 94* 94*  CO2 26 22  GLUCOSE 93 87  BUN 48* 88*  CREATININE 13.33* 17.59*  CALCIUM 10.0 10.2    GFR: Estimated Creatinine Clearance: 2.3 mL/min (A) (by C-G formula based on SCr of 17.59 mg/dL (H)). Liver Function Tests: Recent Labs  Lab 02/03/21 1632 02/05/21 2047  AST 22 19  ALT 9 7  ALKPHOS 45 50  BILITOT 0.5 1.1  PROT 7.9 8.0  ALBUMIN 3.9 4.1   Recent Labs  Lab 02/03/21 1632 02/05/21 2047  LIPASE 81* 59*   No results for input(s): AMMONIA in the last 168 hours. Coagulation Profile: No results for input(s): INR, PROTIME in the last 168 hours. Cardiac Enzymes: No results for input(s): CKTOTAL, CKMB, CKMBINDEX, TROPONINI in the last 168 hours. BNP (last 3 results) No results for input(s): PROBNP in the last 8760 hours. HbA1C: No results for input(s): HGBA1C in the last 72 hours. CBG: No results for input(s): GLUCAP in the last 168 hours. Lipid Profile: No results for input(s): CHOL, HDL, LDLCALC, TRIG, CHOLHDL, LDLDIRECT in the last 72 hours. Thyroid Function Tests: No results for input(s): TSH, T4TOTAL, FREET4, T3FREE, THYROIDAB in the last 72 hours. Anemia Panel: No results for input(s): VITAMINB12, FOLATE, FERRITIN, TIBC, IRON, RETICCTPCT in the last 72 hours. Urine analysis:    Component Value Date/Time   COLORURINE YELLOW 12/31/2018 1014   APPEARANCEUR CLEAR 12/31/2018 1014   LABSPEC 1.008 12/31/2018 1014   PHURINE 9.0 (H) 12/31/2018 1014   GLUCOSEU NEGATIVE 12/31/2018 1014   HGBUR SMALL (A) 12/31/2018 1014   BILIRUBINUR NEGATIVE 12/31/2018 1014   BILIRUBINUR NEG 01/29/2016 1142   KETONESUR NEGATIVE 12/31/2018 1014   PROTEINUR >=300 (A) 12/31/2018 1014   UROBILINOGEN 0.2 01/29/2016 1142   UROBILINOGEN 0.2 03/21/2015 1649   NITRITE NEGATIVE 12/31/2018 1014   LEUKOCYTESUR NEGATIVE 12/31/2018 1014    Radiological Exams on Admission - Personally Reviewed: CT ABDOMEN PELVIS W CONTRAST  Result Date: 02/06/2021 CLINICAL DATA:  Right lower quadrant pain 1 dark stool EXAM: CT ABDOMEN AND PELVIS WITH CONTRAST TECHNIQUE: Multidetector CT imaging of the  abdomen and pelvis was performed using the standard protocol following bolus administration of intravenous contrast. CONTRAST:  33m OMNIPAQUE IOHEXOL 350 MG/ML SOLN COMPARISON:  02/03/2021 FINDINGS: Lower chest: Lung bases are clear. Hepatobiliary: Liver is within normal limits. Vicarious excretion of contrast within the gallbladder. No intrahepatic or extrahepatic ductal dilatation. Pancreas: Within normal limits. Spleen: Within normal limits. Adrenals/Urinary Tract: Adrenal glands are within normal limits. Bilateral renal cortical atrophy with small renal cysts measuring up to 9 mm in the right lower kidney (series 4/image 27). No hydronephrosis. Excretory contrast in the bladder. Stomach/Bowel: Stomach is within normal limits. No evidence of bowel obstruction. Normal appendix (series 4/image 39). No colonic wall thickening or inflammatory changes. Vascular/Lymphatic: No evidence of abdominal aortic aneurysm. Atherosclerotic calcifications of the abdominal aorta and branch vessels. No suspicious abdominopelvic lymphadenopathy. Reproductive: Status post hysterectomy. Left ovary is within normal limits.  No right adnexal mass. Other: No abdominopelvic ascites. Musculoskeletal: Mild degenerative changes of the lower lumbar spine. IMPRESSION: No evidence of bowel obstruction. Normal appendix. No colonic wall thickening or inflammatory changes. No interval change from recent CT. Electronically Signed   By: SJulian HyM.D.   On: 02/06/2021 01:02    EKG: Personally reviewed.  Rhythm is normal sinus rhythm with heart rate of 72 bpm.  No dynamic ST segment changes appreciated.  Assessment/Plan Principal Problem:   Acute upper GI bleed  Patient presenting with several days of melena with rectal exam exempla finding this with positive Hemoccult While patient's hemoglobin has dropped from 12.2 on 8/28 to 9.8 today, it must be noted that the 12.2 is much higher than the patient's previously documented  baseline.   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   Patient been placed on Protonix IV every 12 hours  Patient is currently n.p.o.  Case has been discussed with  Dr. CBryan Lemmawith gastroenterology via epic secure chat and they have graciously agreed to come evaluate the patient this morning.    Active Problems:   Flank pain  Etiology unclear, but the fact that this right flank pain began shortly after the patient had several bouts of vomiting 3 days ago suggests that this is likely musculoskeletal strain Patient has already undergone 2 CTs of the abdomen and pelvis over the past 72 hours both of which have been unremarkable. On physical examination abdominal exam seems to be benign and soft there is no evidence of flank hematoma to suggest retroperitoneal hematoma or vesicles to suggest early herpes zoster. As needed opiate-based analgesics for substantial pain, will quickly de-escalate as able Ordering Lidoderm patch to the area.   [place]  Due to patient's severe abdominal and flank pain patient missed a session of hemodialysis on 8/29. Patient now exhibits mild hyperkalemia without evidence of substantial volume overload I have discussed the case with Dr. WJustin Mendwith nephrology who has agreed to evaluate the patient for resumption of hemodialysis services    Hypertensive urgency  Patient exhibiting substantial hypertension in the emergency department with blood pressures as high as 210/83 Patient states that she did not take her antihypertensives yesterday Will resume oral and hypertensives Will provide patient with as needed intravenous antihypertensives for markedly elevated blood pressure    Mixed hyperlipidemia  Continue home regimen of statin therapy    Hyperkalemia  Mild hyperkalemia without EKG  changes Hemodialysis to proceed morning of 8/31 per my discussion with nephrology.    Chronic pancreatitis (HCC)  Longstanding known history of chronic  pancreatitis confirmed via EUS in 2021   Code Status:  Full code Family Communication: deferred   Status is: Observation  The patient remains OBS appropriate and will d/c before 2 midnights.  Dispo: The patient is from: Home              Anticipated d/c is to: Home              Patient currently is not medically stable to d/c.   Difficult to place patient No        Vernelle Emerald MD [facility] Pager [phone]  If 7PM-7AM, please contact night-coverage [email or web address] Use universal [place] password for that web site. If you do not have the password, please call the hospital operator.  02/06/2021, 8:35 AM

## 2021-02-06 NOTE — Progress Notes (Signed)
Ashley [name] is a 70 Y/O female in hemodialysis MWF at [facility]. Last HD 02/01/2021. She has been admitted as observation patient for flank pain, N, V and dark stools. We will manage HD and consult formally if patient status upgraded to in-patient.   FOBT positive. No heparin   HD orders: MWF [place] 3.5 hours 160 NRe 350/500 51 kg 2.0K/2.0 Ca UFP 4 AVF -Heparin 2000 units initial bolus Heparin 1000 units IV mid run -Mircera 100 mcg IV q 2 weeks (last dose 01/28/2021) -Venofer 50 mg IV q week -Hectorol 3 mcg IV TIW   [name] [facility] [phone]

## 2021-02-06 NOTE — H&P (View-Only) (Signed)
Referring Provider:  [facility]         Primary Care Physician:  Sonia Side., FNP Primary Gastroenterologist:  Zenovia Jarred, MD   Reason for Consultation:    GI bleed               ASSESSMENT / PLAN   # 70 yo female multiple medical problems admitted with with nausea / vomiting, right flank pain, and  black heme + stools. Two CT scan w/ contrast within last three days in ED were unremarkable, etology of right low flank pain is unclear. 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  --Hemodynamically stable. Will likely need an EGD tomorrow ( hopefully she will dialyze today). The risks and benefits of EGD with possible biopsies were discussed with the patient who agrees to proceed.  --Continue BID IV PPI --Trend CBC --Clear liquids today, NPO after MN okay from GI standpoint.   # Chronic [place] anemia. Hgb is 9 which is at baseline. I think the 12.1 hgb in ED a couple of days ago was a spurious result.  ( unless recently transfused at HD)  # Very remote history of perforated PUD  # Chronic pancreatitis, confirmed by EUS Aug 2021  # Hepatitis C, treated with Epculsa. Negative HCV quant in Aug 2021  # ESRD on HD  MWF. Nephrology aware patient is here.    HISTORY OF PRESENT ILLNESS                                                                                                                         Chief Complaint: black stool, nausea / vomiting  Ashley [name] is a 70 y.o. female with a past medical history significant for recurrent pancreatitis of unclear etiology, remote perforated PUD, Hepatitis C ( treated), chronic pancreatitis, ESRD on HD M/W/F,  hx of polysubstance abuse, HTN, hyperlipidemia, CVA, hysterectomy.  See PMH for any additional medical history.   Patient saw [place] in 2020 for evaluation of recurrent pancreatitis. EUS in Aug 2021 showed chronic pancreatitis.  She missed follow up  appt in Oct 2021 and hasn't been seen since.    ED course:  Patient presented to ED on 8/28 with right flank / abdominal pain. WBC normla. Hgb 12.2, lipase 48.  CTAP w/ contrast was unrevealing. Felt to have musculoskeletal pain and discharged home. Patient came back to ED 8/30 with ongoing right sided abdominal pain, nausea / vomiting and onset of dark stools. WBC 6.6, hgb 9.8, MCV 90, K+ 5.5, liver chemistries normal. Lipase 59.  INR 1.1, FOBT +. Repeat CTAP w/ contrast was unrevealing.   Ms Majcher says the pain in her lower right side started two weeks ago. It has been constant and unrelated to movement, meals or BMs. No recent injury to area. She has never had this type of pain before. Yesterday she began having nausea / vomiting (  non-bloody emesis) and black stool. No bismuth or iron use. She doesn't take NSAIDS. Pantoprazole on home med. She denies history of GERD.     SIGNIFICANT DIAGNOTIC STUDIES    02/03/21 CTAP w/ contrast for right flank / abd pain  --no acute findings  02/06/21 CTAP w/ contrast  for persistent right flank and abdominal pain  No evidence of bowel obstruction. Normal appendix. No colonic wall thickening or inflammatory changes. No interval change from recent CT.  PREVIOUS ENDOSCOPIC EVALUATIONS    March 2021 EGD for abdominal pain and peripancreatic inflammation on CT scan --Erosive gastropathy with no stigmata of recent bleeding. Biopsied. - Duodenitis. Biopsied. - A single non-bleeding angiodysplastic lesion in the duodenum. Treated with argon plasma coagulation (APC). Thought reasonable to ablate as at higher risk of bleeding given comorbidities - The examination was otherwise normal.  FINAL MICROSCOPIC DIAGNOSIS:   A. DUODENUM, BIOPSY:  -  Peptic duodenitis  -  No dysplasia or malignancy identified   B. STOMACH, BIOPSY:  -  Chronic gastritis with intestinal metaplasia  -  No H. pylori or malignancy identified  -  See comment    Aug 2021 EUS --Upper  endoscopy was normal.  --Impressive parenchymal and ductal changes that are consistent with chronic pancreatitis throughout the gland.   Patient has never had a colonoscopy    Past Medical History:  Diagnosis Date   Acute pancreatitis 2000   2000, 12/2018, 08/2019   Arthritis    Cervical radiculopathy 02/28/2011   Cocaine substance abuse ([place]) 05/26/2013   positive UDS    Duodenitis    Erosive gastropathy    ESRD on hemodialysis (HCC)    TTS   GERD (gastroesophageal reflux disease)    Hepatitis C 1987   dt hx IVDA.  genotype 2B.  Epclusa started early 04/2020.     Hiatal hernia    Hyperlipidemia 2015   Hypertension 2008   Marijuana abuse 05/27/2003   positive UDS, family members smoke as well   Pancreatitis    Progressive focal motor weakness 06/14/2017   Schatzki's ring    Stroke ([place]) 06/2017   MRI:MRI: small, subacute left internal capsule infarct.  Chronic microvascular ischemic changes w parenchymal volume loss. Chronic white matter periventricular microhemorrhage, likely due to htn   Ulcer 1990   gastric ulcer. Ruptured s/p emergency repair    Past Surgical History:  Procedure Laterality Date   ABDOMINAL HYSTERECTOMY  1979   AV FISTULA PLACEMENT Left 06/16/2017   Procedure: ARTERIOVENOUS (AV) FISTULA CREATION LEFT ARM;  Surgeon: Conrad Apple Creek, MD;  Location: [place];  Service: Vascular;  Laterality: Left;   [place] Left 10/02/2017   Procedure: BASILIC VEIN TRANSPOSITION SECOND STAGE LEFT ARM;  Surgeon: Rosetta Posner, MD;  Location: [place];  Service: Vascular;  Laterality: Left;   BIOPSY  09/06/2019   Procedure: BIOPSY;  Surgeon: Gatha Mayer, MD;  Location: [facility] ENDOSCOPY;  Service: Endoscopy;;   ESOPHAGOGASTRODUODENOSCOPY N/A 05/29/2013   Procedure: ESOPHAGOGASTRODUODENOSCOPY (EGD);  Surgeon: Jerene Bears, MD;  Location[facility] ENDOSCOPY;  Service: Endoscopy;  Laterality: N/A;   ESOPHAGOGASTRODUODENOSCOPY  05/2013   for epigastric pain.  Nonobstructing  Schatzki ring at GEJ, mild gastropathy, nonbleeding AVMs in bulb and D2. 5 mm sessile polyp in bulb.   ESOPHAGOGASTRODUODENOSCOPY (EGD) WITH PROPOFOL N/A 09/06/2019   Procedure: ESOPHAGOGASTRODUODENOSCOPY (EGD) WITH PROPOFOL;  Surgeon: Gatha Mayer, MD;  Location: [place];  Service: Endoscopy;  Laterality: N/A;   ESOPHAGOGASTRODUODENOSCOPY (EGD) WITH PROPOFOL N/A 02/02/2020  Procedure: ESOPHAGOGASTRODUODENOSCOPY (EGD) WITH PROPOFOL;  Surgeon: [name], MD;  Location: [facility] ENDOSCOPY;  Service: Endoscopy;  Laterality: N/A;   EUS N/A 02/02/2020   Procedure: UPPER ENDOSCOPIC ULTRASOUND (EUS) RADIAL;  Surgeon: [name], MD;  Location: [facility] ENDOSCOPY;  Service: Endoscopy;  Laterality: N/A;   EXCHANGE OF A DIALYSIS CATHETER Left 07/31/2017   Procedure: Removal  OF A  Right GroinTUNNELED  DIALYSIS CATHETER ,  Insertion of Left Femoral Dialysis Catheter.;  Surgeon: Rosetta Posner, MD;  Location: [facility] OR;  Service: Vascular;  Laterality: Left;   HOT HEMOSTASIS N/A 09/06/2019   Procedure: HOT HEMOSTASIS (ARGON PLASMA COAGULATION/BICAP);  Surgeon: Gatha Mayer, MD;  Location: [facility] ENDOSCOPY;  Service: Endoscopy;  Laterality: N/A;   INSERTION OF DIALYSIS CATHETER Right 06/16/2017   Procedure: INSERTION OF DIALYSIS CATHETER;  Surgeon: Conrad Marie, MD;  Location: [facility] OR;  Service: Vascular;  Laterality: Right;   IR AV DIALY SHUNT INTRO NEEDLE/INTRACATH INITIAL W/PTA/IMG LEFT  06/21/2018   REPAIR OF PERFORATED ULCER  1990   gastric ulcer    Prior to Admission medications   Medication Sig Start Date End Date Taking? Authorizing Provider  acetaminophen (TYLENOL) 325 MG tablet Take 2 tablets (650 mg total) by mouth every 6 (six) hours as needed for mild pain (or Fever >/= 101). 01/11/20  Yes Little Ishikawa, MD  amLODipine (NORVASC) 10 MG tablet Take 1 tablet (10 mg total) by mouth daily. Patient taking differently: Take 10 mg by mouth daily at 12 noon. 11/11/16  Yes Luiz Blare Y, DO   atorvastatin (LIPITOR) 20 MG tablet Take 20 mg by mouth at bedtime. 08/05/20  Yes [provider]  B Complex-C-Zn-Folic Acid (DIALYVITE Q000111Q WITH ZINC) 0.8 MG TABS Take 1 tablet by mouth daily.  07/07/19  Yes [provider]  cinacalcet (SENSIPAR) 30 MG tablet Take 30 mg by mouth at bedtime.    Yes [provider]  cloNIDine (CATAPRES) 0.1 MG tablet Take 0.1 mg by mouth 3 (three) times daily. 06/25/19  Yes [provider]  hydrALAZINE (APRESOLINE) 25 MG tablet Take 3 tablets (75 mg total) by mouth every 8 (eight) hours. 11/14/20 02/12/21 Yes [place] Eric J, DO  lidocaine-prilocaine (EMLA) cream Apply 1 application topically See admin instructions. Apply small amount to access site on Tuesday, Thursday, Saturday one hour before dialysis. Cover with occlusive dressing (saran wrap) 08/25/19  Yes [provider]  methocarbamol (ROBAXIN) 500 MG tablet Take 1 tablet (500 mg total) by mouth 2 (two) times daily. 02/04/21  Yes Card[place], Grayce Sessions, MD  Methoxy PEG-Epoetin Beta (MIRCERA IJ) Administered at dialysis 09/24/20 09/23/21 Yes [provider]  metoprolol succinate (TOPROL-XL) 25 MG 24 hr tablet Take 0.5 tablets (12.5 mg total) by mouth daily. 11/14/20 02/12/21 Yes [place] Eric J, DO  mirtazapine (REMERON) 15 MG tablet Take 15 mg by mouth at bedtime. 11/02/20  Yes [provider]  pantoprazole (PROTONIX) 40 MG tablet Take 1 tablet (40 mg total) by mouth daily. 11/14/20 02/12/21 Yes [place] Eric J, DO  polyethylene glycol (MIRALAX) 17 g packet Take 17 g by mouth 2 (two) times daily. Patient taking differently: Take 17 g by mouth daily as needed for moderate constipation. 09/14/19  Yes Khatri, Hina, PA-C  sevelamer carbonate (RENVELA) 800 MG tablet Take 800 mg by mouth 3 (three) times daily with meals. 07/23/20  Yes [provider]  ferric citrate (AURYXIA) 1 GM 210 MG(Fe) tablet Take 1 tablet (210 mg total) by mouth 3 (three)  times daily with  meals. Patient not taking: No sig reported 01/11/20   Little Ishikawa, MD  Nutritional Supplements (FEEDING SUPPLEMENT, NEPRO CARB STEADY,) LIQD Take 237 mLs by mouth 3 (three) times daily as needed (Supplement). 07/11/19   Black, Lezlie Octave, NP    Current Facility-Administered Medications  Medication Dose Route Frequency Provider Last Rate Last Admin   acetaminophen (TYLENOL) tablet 650 mg  650 mg Oral Q6H PRN Shalhoub, Sherryll Burger, MD       Or   acetaminophen (TYLENOL) suppository 650 mg  650 mg Rectal Q6H PRN Shalhoub, Sherryll Burger, MD       amLODipine (NORVASC) tablet 10 mg  10 mg Oral Q1200 Shalhoub, Sherryll Burger, MD   10 mg at 02/06/21 0759   atorvastatin (LIPITOR) tablet 20 mg  20 mg Oral QHS Shalhoub, Sherryll Burger, MD       Chlorhexidine Gluconate Cloth 2 % PADS 6 each  6 each Topical Q0600 Valentina Gu, NP       cinacalcet (SENSIPAR) tablet 30 mg  30 mg Oral QHS Shalhoub, Sherryll Burger, MD       cloNIDine (CATAPRES) tablet 0.1 mg  0.1 mg Oral BID Vernelle Emerald, MD   0.1 mg at 02/06/21 0757   doxercalciferol (HECTOROL) capsule 3 mcg  3 mcg Oral Q M,W,F-HD Valentina Gu, NP       fentaNYL (SUBLIMAZE) injection 25 mcg  25 mcg Intravenous Q2H PRN Shalhoub, Sherryll Burger, MD       Or   fentaNYL (SUBLIMAZE) injection 50 mcg  50 mcg Intravenous Q2H PRN Vernelle Emerald, MD   50 mcg at 02/06/21 0252   hydrALAZINE (APRESOLINE) tablet 75 mg  75 mg Oral Q8H Shalhoub, Sherryll Burger, MD   75 mg at 02/06/21 0758   iron sucrose (VENOFER) 50 mg in sodium chloride 0.9 % 100 mL IVPB  50 mg Intravenous Q Wed-HD Valentina Gu, NP       labetalol (NORMODYNE) injection 20 mg  20 mg Intravenous Q2H PRN Shalhoub, Sherryll Burger, MD       lidocaine (LIDODERM) 5 % 1 patch  1 patch Transdermal Q24H Shalhoub, Sherryll Burger, MD   1 patch at 02/06/21 0930   metoprolol succinate (TOPROL-XL) 24 hr tablet 12.5 mg  12.5 mg Oral Daily Shalhoub, Sherryll Burger, MD       mirtazapine (REMERON) tablet 15 mg  15 mg Oral QHS Shalhoub, Sherryll Burger, MD       ondansetron [facility]) tablet 4 mg  4 mg Oral Q6H PRN Shalhoub, Sherryll Burger, MD       Or   ondansetron [facility]) injection 4 mg  4 mg Intravenous Q6H PRN Shalhoub, Sherryll Burger, MD       [START ON 02/09/2021] pantoprazole (PROTONIX) injection 40 mg  40 mg Intravenous Q12H Shalhoub, Sherryll Burger, MD       polyethylene glycol (MIRALAX / GLYCOLAX) packet 17 g  17 g Oral Daily PRN Shalhoub, Sherryll Burger, MD       sevelamer carbonate (RENVELA) tablet 800 mg  800 mg Oral TID WC Shalhoub, Sherryll Burger, MD       Current Outpatient Medications  Medication Sig Dispense Refill   acetaminophen (TYLENOL) 325 MG tablet Take 2 tablets (650 mg total) by mouth every 6 (six) hours as needed for mild pain (or Fever >/= 101).     amLODipine (NORVASC) 10 MG tablet Take 1 tablet (10 mg total) by mouth daily. (Patient taking differently: Take  10 mg by mouth daily at 12 noon.) 90 tablet 1   atorvastatin (LIPITOR) 20 MG tablet Take 20 mg by mouth at bedtime.     B Complex-C-Zn-Folic Acid (DIALYVITE Q000111Q WITH ZINC) 0.8 MG TABS Take 1 tablet by mouth daily.      cinacalcet (SENSIPAR) 30 MG tablet Take 30 mg by mouth at bedtime.      cloNIDine (CATAPRES) 0.1 MG tablet Take 0.1 mg by mouth 3 (three) times daily.     hydrALAZINE (APRESOLINE) 25 MG tablet Take 3 tablets (75 mg total) by mouth every 8 (eight) hours. 270 tablet 2   lidocaine-prilocaine (EMLA) cream Apply 1 application topically See admin instructions. Apply small amount to access site on Tuesday, Thursday, Saturday one hour before dialysis. Cover with occlusive dressing (saran wrap)     methocarbamol (ROBAXIN) 500 MG tablet Take 1 tablet (500 mg total) by mouth 2 (two) times daily. 20 tablet 0   Methoxy PEG-Epoetin Beta (MIRCERA IJ) Administered at dialysis     metoprolol succinate (TOPROL-XL) 25 MG 24 hr tablet Take 0.5 tablets (12.5 mg total) by mouth daily. 15 tablet 2   mirtazapine (REMERON) 15 MG tablet Take 15 mg by mouth at bedtime.     pantoprazole (PROTONIX) 40 MG  tablet Take 1 tablet (40 mg total) by mouth daily. 30 tablet 2   polyethylene glycol (MIRALAX) 17 g packet Take 17 g by mouth 2 (two) times daily. (Patient taking differently: Take 17 g by mouth daily as needed for moderate constipation.) 14 each 0   sevelamer carbonate (RENVELA) 800 MG tablet Take 800 mg by mouth 3 (three) times daily with meals.     ferric citrate (AURYXIA) 1 GM 210 MG(Fe) tablet Take 1 tablet (210 mg total) by mouth 3 (three) times daily with meals. (Patient not taking: No sig reported) 270 tablet 1   Nutritional Supplements (FEEDING SUPPLEMENT, NEPRO CARB STEADY,) LIQD Take 237 mLs by mouth 3 (three) times daily as needed (Supplement).  0    Allergies as of 02/05/2021 - Review Complete 02/05/2021  Allergen Reaction Noted   Aspirin Nausea And Vomiting 07/18/2009   Ibuprofen Nausea And Vomiting 12/24/2009    Family History  Problem Relation Age of Onset   Hypertension Father    Cancer Father    Hyperlipidemia Father    Seizures Sister    Early death Daughter    Kidney disease Daughter        end stage dialysis dependent     Social History   Socioeconomic History   Marital status: Widowed    Spouse name: Not on file   Number of children: 3   Years of education: Not on file   Highest education level: Not on file  Occupational History   Occupation: retired  Tobacco Use   Smoking status: Every Day    Packs/day: 0.25    Years: 40.00    Pack years: 10.00    Types: Cigarettes   Smokeless tobacco: Never  Vaping Use   Vaping Use: Never used  Substance and Sexual Activity   Alcohol use: No    Alcohol/week: 0.0 standard drinks   Drug use: Not Currently    Types: Heroin, Marijuana, Cocaine    Comment: hasn't used cocaine in 1-2 years; she smokes marijuana daily, "whenever I can get it"   Sexual activity: Never  Other Topics Concern   Not on file  Social History Narrative   Not on file   Social Determinants of Health  Financial Resource Strain: Not on  file  Food Insecurity: Not on file  Transportation Needs: Not on file  Physical Activity: Not on file  Stress: Not on file  Social Connections: Not on file  Intimate Partner Violence: Not on file    Review of Systems: All systems reviewed and negative except where noted in HPI.   OBJECTIVE    Physical Exam: Vital signs in last 24 hours: Temp:  [99.5 F (37.5 C)] 99.5 F (37.5 C) (08/30 1751) Pulse Rate:  [60-83] 61 (08/31 0900) Resp:  [11-22] 13 (08/31 0900) BP: (139-210)/(69-90) 139/69 (08/31 0900) SpO2:  [95 %-100 %] 97 % (08/31 0900) Weight:  [53.1 kg] 53.1 kg (08/31 0227)   General:   Alert  female in NAD Psych:  Pleasant, cooperative. Normal mood and affect. Eyes:  Pupils equal, sclera clear, no icterus.   Conjunctiva pink. Ears:  Normal auditory acuity. Nose:  No deformity, discharge,  or lesions. Neck:  Supple; no masses Lungs:  Clear throughout to auscultation.   No wheezes, crackles, or rhonchi.  Heart:  Regular rate and rhythm;  no lower extremity edema Abdomen:  Soft, non-distended, nontender, BS active, no palp mass   Rectal:  Deferred  Msk:  Symmetrical without gross deformities. . Neurologic:  Alert and  oriented x4;  grossly normal neurologically. Skin:  Intact without significant lesions or rashes. Lidoderm patch on her lower right side ( above iliac crest)   Scheduled inpatient medications  amLODipine  10 mg Oral Q1200   atorvastatin  20 mg Oral QHS   Chlorhexidine Gluconate Cloth  6 each Topical Q0600   cinacalcet  30 mg Oral QHS   cloNIDine  0.1 mg Oral BID   doxercalciferol  3 mcg Oral Q M,W,F-HD   hydrALAZINE  75 mg Oral Q8H   lidocaine  1 patch Transdermal Q24H   metoprolol succinate  12.5 mg Oral Daily   mirtazapine  15 mg Oral QHS   [START ON 02/09/2021] pantoprazole  40 mg Intravenous Q12H   sevelamer carbonate  800 mg Oral TID WC      Intake/Output from previous day: No intake/output data recorded. Intake/Output this shift: No  intake/output data recorded.   Lab Results: Recent Labs    02/03/21 1632 02/05/21 2355 02/06/21 0929  WBC 6.0 6.6 5.7  HGB 12.2 9.8* 9.0*  HCT 37.4 31.2* 28.4*  PLT 323 274 280   BMET Recent Labs    02/03/21 1632 02/05/21 2047 02/06/21 0929  NA 137 137 137  K 4.6 5.5* 5.3*  CL 94* 94* 97*  CO2 '26 22 24  '$ GLUCOSE 93 87 87  BUN 48* 88* 102*  CREATININE 13.33* 17.59* 18.72*  CALCIUM 10.0 10.2 9.1   LFT Recent Labs    02/06/21 0929  PROT 6.0*  ALBUMIN 3.0*  AST 19  ALT <5  ALKPHOS 36*  BILITOT 0.9   PT/INR No results for input(s): LABPROT, INR in the last 72 hours. Hepatitis Panel No results for input(s): HEPBSAG, HCVAB, HEPAIGM, HEPBIGM in the last 72 hours.   . CBC Latest Ref Rng & Units 02/06/2021 02/05/2021 02/03/2021  WBC 4.0 - 10.5 K/uL 5.7 6.6 6.0  Hemoglobin 12.0 - 15.0 g/dL 9.0(L) 9.8(L) 12.2  Hematocrit 36.0 - 46.0 % 28.4(L) 31.2(L) 37.4  Platelets 150 - 400 K/uL 280 274 323    . CMP Latest Ref Rng & Units 02/06/2021 02/05/2021 02/03/2021  Glucose 70 - 99 mg/dL 87 87 93  BUN 8 - 23 mg/dL 102(H) 88(H)  48(H)  Creatinine 0.44 - 1.00 mg/dL 18.72(H) 17.59(H) 13.33(H)  Sodium 135 - 145 mmol/L 137 137 137  Potassium 3.5 - 5.1 mmol/L 5.3(H) 5.5(H) 4.6  Chloride 98 - 111 mmol/L 97(L) 94(L) 94(L)  CO2 22 - 32 mmol/L '24 22 26  '$ Calcium 8.9 - 10.3 mg/dL 9.1 10.2 10.0  Total Protein 6.5 - 8.1 g/dL 6.0(L) 8.0 7.9  Total Bilirubin 0.3 - 1.2 mg/dL 0.9 1.1 0.5  Alkaline Phos 38 - 126 U/L 36(L) 50 45  AST 15 - 41 U/L '19 19 22  '$ ALT 0 - 44 U/L '5 7 9   '$ Studies/Results: CT ABDOMEN PELVIS W CONTRAST  Result Date: 02/06/2021 CLINICAL DATA:  Right lower quadrant pain 1 dark stool EXAM: CT ABDOMEN AND PELVIS WITH CONTRAST TECHNIQUE: Multidetector CT imaging of the abdomen and pelvis was performed using the standard protocol following bolus administration of intravenous contrast. CONTRAST:  71m OMNIPAQUE IOHEXOL 350 MG/ML SOLN COMPARISON:  02/03/2021 FINDINGS: Lower  chest: Lung bases are clear. Hepatobiliary: Liver is within normal limits. Vicarious excretion of contrast within the gallbladder. No intrahepatic or extrahepatic ductal dilatation. Pancreas: Within normal limits. Spleen: Within normal limits. Adrenals/Urinary Tract: Adrenal glands are within normal limits. Bilateral renal cortical atrophy with small renal cysts measuring up to 9 mm in the right lower kidney (series 4/image 27). No hydronephrosis. Excretory contrast in the bladder. Stomach/Bowel: Stomach is within normal limits. No evidence of bowel obstruction. Normal appendix (series 4/image 39). No colonic wall thickening or inflammatory changes. Vascular/Lymphatic: No evidence of abdominal aortic aneurysm. Atherosclerotic calcifications of the abdominal aorta and branch vessels. No suspicious abdominopelvic lymphadenopathy. Reproductive: Status post hysterectomy. Left ovary is within normal limits.  No right adnexal mass. Other: No abdominopelvic ascites. Musculoskeletal: Mild degenerative changes of the lower lumbar spine. IMPRESSION: No evidence of bowel obstruction. Normal appendix. No colonic wall thickening or inflammatory changes. No interval change from recent CT. Electronically Signed   By: SJulian HyM.D.   On: 02/06/2021 01:02     PTye Savoy NP-C @  02/06/2021, 11:16 AM   ________________________________________________________________________  LVelora HecklerGI MD note:  Given her Covid + I did not enter her room this after however I reviewed the data and agree with the assessment and plan described above.  She has right flank pain that is unlikely to be a gastrointestinal issue. She has had vomiting and black stools that may represent some GI bleeding. I doubt ongoing, active bleeding and am planning further testing with EGD tomorrow, it must be last case of the day given her covid + testing.  DOwens Loffler MD LThe [facility] Center At MeridianGastroenterology Pager [phone]

## 2021-02-06 NOTE — ED Notes (Signed)
Attempted to call report, no answer at this time.

## 2021-02-07 ENCOUNTER — Encounter (HOSPITAL_COMMUNITY): Payer: Self-pay | Admitting: Internal Medicine

## 2021-02-07 ENCOUNTER — Inpatient Hospital Stay (HOSPITAL_COMMUNITY): Payer: Medicare Other | Admitting: Certified Registered"

## 2021-02-07 ENCOUNTER — Inpatient Hospital Stay (HOSPITAL_COMMUNITY): Payer: Medicare Other

## 2021-02-07 ENCOUNTER — Encounter (HOSPITAL_COMMUNITY)
Admission: EM | Disposition: A | Discharge status: Home / Self Care | Payer: Self-pay | Source: Home / Self Care | Attending: Internal Medicine

## 2021-02-07 DIAGNOSIS — I10 Essential (primary) hypertension: Secondary | ICD-10-CM

## 2021-02-07 DIAGNOSIS — D631 Anemia in chronic kidney disease: Secondary | ICD-10-CM

## 2021-02-07 DIAGNOSIS — K31819 Angiodysplasia of stomach and duodenum without bleeding: Secondary | ICD-10-CM

## 2021-02-07 DIAGNOSIS — N189 Chronic kidney disease, unspecified: Secondary | ICD-10-CM

## 2021-02-07 DIAGNOSIS — U071 COVID-19: Secondary | ICD-10-CM

## 2021-02-07 DIAGNOSIS — J1282 Pneumonia due to coronavirus disease 2019: Secondary | ICD-10-CM

## 2021-02-07 HISTORY — PX: HEMOSTASIS CLIP PLACEMENT: SHX6857

## 2021-02-07 HISTORY — PX: HOT HEMOSTASIS: SHX5433

## 2021-02-07 HISTORY — PX: ESOPHAGOGASTRODUODENOSCOPY (EGD) WITH PROPOFOL: SHX5813

## 2021-02-07 LAB — CBC
HCT: 26.9 % — ABNORMAL LOW (ref 36.0–46.0)
Hemoglobin: 8.6 g/dL — ABNORMAL LOW (ref 12.0–15.0)
MCH: 28.9 pg (ref 26.0–34.0)
MCHC: 32 g/dL (ref 30.0–36.0)
MCV: 90.3 fL (ref 80.0–100.0)
Platelets: 245 K/uL (ref 150–400)
RBC: 2.98 MIL/uL — ABNORMAL LOW (ref 3.87–5.11)
RDW: 18.4 % — ABNORMAL HIGH (ref 11.5–15.5)
WBC: 6.9 K/uL (ref 4.0–10.5)
nRBC: 0.3 % — ABNORMAL HIGH (ref 0.0–0.2)

## 2021-02-07 LAB — COMPREHENSIVE METABOLIC PANEL
ALT: 7 U/L (ref 0–44)
AST: 13 U/L — ABNORMAL LOW (ref 15–41)
Albumin: 3.3 g/dL — ABNORMAL LOW (ref 3.5–5.0)
Alkaline Phosphatase: 43 U/L (ref 38–126)
Anion gap: 20 — ABNORMAL HIGH (ref 5–15)
BUN: 106 mg/dL — ABNORMAL HIGH (ref 8–23)
CO2: 20 mmol/L — ABNORMAL LOW (ref 22–32)
Calcium: 8.9 mg/dL (ref 8.9–10.3)
Chloride: 95 mmol/L — ABNORMAL LOW (ref 98–111)
Creatinine, Ser: 20.08 mg/dL — ABNORMAL HIGH (ref 0.44–1.00)
GFR, Estimated: 2 mL/min — ABNORMAL LOW (ref >60)
Glucose, Bld: 78 mg/dL (ref 70–99)
Potassium: 5.5 mmol/L — ABNORMAL HIGH (ref 3.5–5.1)
Sodium: 135 mmol/L (ref 135–145)
Total Bilirubin: 1.3 mg/dL — ABNORMAL HIGH (ref 0.3–1.2)
Total Protein: 6.4 g/dL — ABNORMAL LOW (ref 6.5–8.1)

## 2021-02-07 SURGERY — ESOPHAGOGASTRODUODENOSCOPY (EGD) WITH PROPOFOL
Anesthesia: Monitor Anesthesia Care

## 2021-02-07 MED ORDER — PANTOPRAZOLE SODIUM 40 MG PO TBEC
40.0000 mg | DELAYED_RELEASE_TABLET | Freq: Every day | ORAL | Status: DC
  Administered 2021-02-08 – 2021-02-12 (×5): 40 mg via ORAL
  Filled 2021-02-07 (×5): qty 1

## 2021-02-07 MED ORDER — DOXERCALCIFEROL 2.5 MCG PO CAPS
3.0000 ug | ORAL_CAPSULE | ORAL | Status: DC
  Administered 2021-02-08 – 2021-02-11 (×2): 3 ug via ORAL
  Filled 2021-02-07 (×3): qty 1

## 2021-02-07 MED ORDER — SUCCINYLCHOLINE CHLORIDE 200 MG/10ML IV SOSY
PREFILLED_SYRINGE | INTRAVENOUS | Status: DC | PRN
  Administered 2021-02-07: 140 mg via INTRAVENOUS

## 2021-02-07 MED ORDER — SODIUM CHLORIDE 0.9 % IV SOLN
100.0000 mg | Freq: Every day | INTRAVENOUS | Status: AC
  Administered 2021-02-08 – 2021-02-11 (×4): 100 mg via INTRAVENOUS
  Filled 2021-02-07 (×4): qty 20

## 2021-02-07 MED ORDER — SODIUM CHLORIDE 0.9 % IV SOLN
200.0000 mg | Freq: Once | INTRAVENOUS | Status: AC
  Administered 2021-02-07: 200 mg via INTRAVENOUS
  Filled 2021-02-07: qty 40

## 2021-02-07 MED ORDER — SODIUM CHLORIDE 0.9 % IV SOLN: INTRAVENOUS | Status: DC | PRN

## 2021-02-07 MED ORDER — PROPOFOL 10 MG/ML IV BOLUS
INTRAVENOUS | Status: DC | PRN
  Administered 2021-02-07: 30 mg via INTRAVENOUS
  Administered 2021-02-07: 100 mg via INTRAVENOUS
  Administered 2021-02-07: 50 mg via INTRAVENOUS

## 2021-02-07 MED ORDER — GLUCAGON HCL RDNA (DIAGNOSTIC) 1 MG IJ SOLR
INTRAMUSCULAR | Status: AC
  Filled 2021-02-07: qty 1

## 2021-02-07 MED ORDER — SEVELAMER CARBONATE 800 MG PO TABS
1600.0000 mg | ORAL_TABLET | Freq: Three times a day (TID) | ORAL | Status: DC
  Administered 2021-02-07 – 2021-02-12 (×12): 1600 mg via ORAL
  Filled 2021-02-07 (×15): qty 2

## 2021-02-07 MED ORDER — LIDOCAINE 2% (20 MG/ML) 5 ML SYRINGE
INTRAMUSCULAR | Status: DC | PRN
  Administered 2021-02-07: 100 mg via INTRAVENOUS

## 2021-02-07 SURGICAL SUPPLY — 14 items

## 2021-02-07 NOTE — Anesthesia Procedure Notes (Signed)
Procedure Name: Intubation Date/Time: 02/07/2021 12:01 PM Performed by: Barrington Ellison, CRNA Pre-anesthesia Checklist: Patient identified, Emergency Drugs available, Suction available and Patient being monitored Patient Re-evaluated:Patient Re-evaluated prior to induction Oxygen Delivery Method: Circle System Utilized Preoxygenation: Pre-oxygenation with 100% oxygen Induction Type: IV induction Ventilation: Mask ventilation without difficulty Laryngoscope Size: Mac and 3 Grade View: Grade I Tube type: Oral Tube size: 7.0 mm Number of attempts: 1 Airway Equipment and Method: Stylet and Oral airway Placement Confirmation: ETT inserted through vocal cords under direct vision, positive ETCO2 and breath sounds checked- equal and bilateral Secured at: 21 cm Tube secured with: Tape Dental Injury: Teeth and Oropharynx as per pre-operative assessment

## 2021-02-07 NOTE — Progress Notes (Signed)
Delay in Remdesivir administration as medication was not available until patient was in endoscopy and then transferred to HD when patient returned to department.

## 2021-02-07 NOTE — Progress Notes (Signed)
   02/07/21 1125  Clinical Encounter Type  Visited With Health care provider  Visit Type Initial  Referral From Nurse  Consult/Referral To Chaplain   Chaplain called unit to request Pt be given Advance Directive paperwork. AD is unable to be notarized while Pt is under airborne/contact precautions. Chaplain is available to answer any questions via phone. Please reach out if Pt wants AD paperwork notarized once released from isolation.   This note was prepared by Chaplain Resident, Dante Gang, MDiv. Chaplain remains available as needed through the on-call pager: [phone].

## 2021-02-07 NOTE — Consult Note (Addendum)
[facility] Renal Consultation Note    Indication for Consultation:  Management of ESRD/hemodialysis; anemia, hypertension/volume and secondary hyperparathyroidism PCP: Dustin Folks FNP  HPI: Ashley [name] is a 70 Y/O female in hemodialysis MWF at [facility]. Last HD 02/01/2021. 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  She was admitted 02/06/2021 as observation patient for flank pain, N, V and dark stools. She has now been upgraded to in patient. Incidental finding of PNA D/T COVID 19. CXR with L opacities. She has been started on Remdesivir. She has been seen by GI, went for EGD today. No recent or old blood in UGI tract. Three small angioectasias with bleeding on contact were found in the duodenal bulb and in the second portion of the duodenum. Treated with APC and endoclip.   Patient not seen due to being COVID 19 patient to reduce risk of transmission to staff and conserve supplies. HPI per EMR.   Past Medical History:  Diagnosis Date   Acute pancreatitis 2000   2000, 12/2018, 08/2019   Arthritis    Cervical radiculopathy 02/28/2011   Cocaine substance abuse ([place]) 05/26/2013   positive UDS    Duodenitis    Erosive gastropathy    ESRD on hemodialysis (HCC)    TTS   GERD (gastroesophageal reflux disease)    Hepatitis C 1987   dt hx IVDA.  genotype 2B.  Epclusa started early 04/2020.     Hiatal hernia    Hyperlipidemia 2015   Hypertension 2008   Marijuana abuse 05/27/2003   positive UDS, family members smoke as well   Pancreatitis    Progressive focal motor weakness 06/14/2017   Schatzki's ring    Stroke ([place]) 06/2017   MRI:MRI: small, subacute left internal capsule infarct.  Chronic microvascular ischemic changes w parenchymal volume loss. Chronic white matter periventricular microhemorrhage, likely due to htn   Ulcer 1990   gastric ulcer.  Ruptured s/p emergency repair   Past Surgical History:  Procedure Laterality Date   ABDOMINAL HYSTERECTOMY  1979   AV FISTULA PLACEMENT Left 06/16/2017   Procedure: ARTERIOVENOUS (AV) FISTULA CREATION LEFT ARM;  Surgeon: Conrad Bigelow, MD;  Location: [place];  Service: Vascular;  Laterality: Left;   [place] Left 10/02/2017   Procedure: BASILIC VEIN TRANSPOSITION SECOND STAGE LEFT ARM;  Surgeon: Rosetta Posner, MD;  Location: [place];  Service: Vascular;  Laterality: Left;   BIOPSY  09/06/2019   Procedure: BIOPSY;  Surgeon: Gatha Mayer, MD;  Location: [facility] ENDOSCOPY;  Service: Endoscopy;;   ESOPHAGOGASTRODUODENOSCOPY N/A 05/29/2013   Procedure: ESOPHAGOGASTRODUODENOSCOPY (EGD);  Surgeon: Jerene Bears, MD;  Location: [facility] ENDOSCOPY;  Service: Endoscopy;  Laterality: N/A;   ESOPHAGOGASTRODUODENOSCOPY  05/2013   for epigastric pain.  Nonobstructing Schatzki ring at GEJ, mild gastropathy, nonbleeding AVMs in bulb and D2. 5 mm sessile polyp in bulb.   ESOPHAGOGASTRODUODENOSCOPY (EGD) WITH PROPOFOL N/A 09/06/2019   Procedure: ESOPHAGOGASTRODUODENOSCOPY (EGD) WITH PROPOFOL;  Surgeon: Gatha Mayer, MD;  Location: [place];  Service: Endoscopy;  Laterality: N/A;   ESOPHAGOGASTRODUODENOSCOPY (EGD) WITH PROPOFOL N/A 02/02/2020   Procedure: ESOPHAGOGASTRODUODENOSCOPY (EGD) WITH PROPOFOL;  Surgeon: [name], MD;  Location: [facility] ENDOSCOPY;  Service: Endoscopy;  Laterality: N/A;   EUS N/A 02/02/2020   Procedure: UPPER ENDOSCOPIC ULTRASOUND (EUS) RADIAL;  Surgeon: [name], MD;  Location: [facility] ENDOSCOPY;  Service: Endoscopy;  Laterality: N/A;   EXCHANGE OF  A DIALYSIS CATHETER Left 07/31/2017   Procedure: Removal  OF A  Right GroinTUNNELED  DIALYSIS CATHETER ,  Insertion of Left Femoral Dialysis Catheter.;  Surgeon: Rosetta Posner, MD;  Location: [facility] OR;  Service: Vascular;  Laterality: Left;   HOT HEMOSTASIS N/A 09/06/2019   Procedure: HOT HEMOSTASIS (ARGON PLASMA COAGULATION/BICAP);   Surgeon: Gatha Mayer, MD;  Location: [facility] ENDOSCOPY;  Service: Endoscopy;  Laterality: N/A;   INSERTION OF DIALYSIS CATHETER Right 06/16/2017   Procedure: INSERTION OF DIALYSIS CATHETER;  Surgeon: Conrad Glassboro, MD;  Location: [facility] OR;  Service: Vascular;  Laterality: Right;   IR AV DIALY SHUNT INTRO NEEDLE/INTRACATH INITIAL W/PTA/IMG LEFT  06/21/2018   REPAIR OF PERFORATED ULCER  1990   gastric ulcer   Family History  Problem Relation Age of Onset   Hypertension Father    Cancer Father    Hyperlipidemia Father    Seizures Sister    Early death Daughter    Kidney disease Daughter        end stage dialysis dependent    Social History:  reports that she has been smoking cigarettes. She has a 10.00 pack-year smoking history. She has never used smokeless tobacco. She reports that she does not currently use drugs after having used the following drugs: Heroin, Marijuana, and Cocaine. She reports that she does not drink alcohol. Allergies  Allergen Reactions   Aspirin Nausea And Vomiting    Stomach ache   Ibuprofen Nausea And Vomiting    Stomach ache   Prior to Admission medications   Medication Sig Start Date End Date Taking? Authorizing Provider  acetaminophen (TYLENOL) 325 MG tablet Take 2 tablets (650 mg total) by mouth every 6 (six) hours as needed for mild pain (or Fever >/= 101). 01/11/20  Yes Little Ishikawa, MD  amLODipine (NORVASC) 10 MG tablet Take 1 tablet (10 mg total) by mouth daily. Patient taking differently: Take 10 mg by mouth daily at 12 noon. 11/11/16  Yes Luiz Blare Y, DO  atorvastatin (LIPITOR) 20 MG tablet Take 20 mg by mouth at bedtime. 08/05/20  Yes [provider]  B Complex-C-Zn-Folic Acid (DIALYVITE Q000111Q WITH ZINC) 0.8 MG TABS Take 1 tablet by mouth daily.  07/07/19  Yes [provider]  cinacalcet (SENSIPAR) 30 MG tablet Take 30 mg by mouth at bedtime.    Yes [provider]  cloNIDine (CATAPRES) 0.1 MG tablet Take 0.1 mg by mouth 3  (three) times daily. 06/25/19  Yes [provider]  hydrALAZINE (APRESOLINE) 25 MG tablet Take 3 tablets (75 mg total) by mouth every 8 (eight) hours. 11/14/20 02/12/21 Yes [place] Eric J, DO  lidocaine-prilocaine (EMLA) cream Apply 1 application topically See admin instructions. Apply small amount to access site on Tuesday, Thursday, Saturday one hour before dialysis. Cover with occlusive dressing (saran wrap) 08/25/19  Yes [provider]  methocarbamol (ROBAXIN) 500 MG tablet Take 1 tablet (500 mg total) by mouth 2 (two) times daily. 02/04/21  Yes Cardama, Grayce Sessions, MD  Methoxy PEG-Epoetin Beta (MIRCERA IJ) Administered at dialysis 09/24/20 09/23/21 Yes [provider]  metoprolol succinate (TOPROL-XL) 25 MG 24 hr tablet Take 0.5 tablets (12.5 mg total) by mouth daily. 11/14/20 02/12/21 Yes [place] Eric J, DO  mirtazapine (REMERON) 15 MG tablet Take 15 mg by mouth at bedtime. 11/02/20  Yes [provider]  pantoprazole (PROTONIX) 40 MG tablet Take 1 tablet (40 mg total) by mouth daily. 11/14/20 02/12/21 Yes [place] Eric J, DO  polyethylene  glycol (MIRALAX) 17 g packet Take 17 g by mouth 2 (two) times daily. Patient taking differently: Take 17 g by mouth daily as needed for moderate constipation. 09/14/19  Yes Khatri, Hina, PA-C  sevelamer carbonate (RENVELA) 800 MG tablet Take 800 mg by mouth 3 (three) times daily with meals. 07/23/20  Yes [provider]  ferric citrate (AURYXIA) 1 GM 210 MG(Fe) tablet Take 1 tablet (210 mg total) by mouth 3 (three) times daily with meals. Patient not taking: No sig reported 01/11/20   Little Ishikawa, MD  Nutritional Supplements (FEEDING SUPPLEMENT, NEPRO CARB STEADY,) LIQD Take 237 mLs by mouth 3 (three) times daily as needed (Supplement). 07/11/19   Radene Gunning, NP   Current Facility-Administered Medications  Medication Dose Route Frequency Provider Last Rate Last Admin   acetaminophen (TYLENOL) tablet 650 mg  650 mg Oral  Q6H PRN Shalhoub, Sherryll Burger, MD       Or   acetaminophen (TYLENOL) suppository 650 mg  650 mg Rectal Q6H PRN Shalhoub, Sherryll Burger, MD       amLODipine (NORVASC) tablet 10 mg  10 mg Oral Q1200 Vernelle Emerald, MD   10 mg at 02/07/21 0859   atorvastatin (LIPITOR) tablet 20 mg  20 mg Oral QHS Vernelle Emerald, MD   20 mg at 02/06/21 2204   Chlorhexidine Gluconate Cloth 2 % PADS 6 each  6 each Topical Q0600 Valentina Gu, NP   6 each at 02/07/21 [id number]   cinacalcet (SENSIPAR) tablet 30 mg  30 mg Oral QHS Vernelle Emerald, MD   30 mg at 02/06/21 2204   cloNIDine (CATAPRES) tablet 0.1 mg  0.1 mg Oral BID Vernelle Emerald, MD   0.1 mg at 02/07/21 0859   doxercalciferol (HECTOROL) capsule 3 mcg  3 mcg Oral Q M,W,F-HD Valentina Gu, NP       feeding supplement (BOOST / RESOURCE BREEZE) liquid 1 Container  1 Container Oral TID BM Bonnielee Haff, MD   1 Container at 02/06/21 2014   fentaNYL (SUBLIMAZE) injection 25 mcg  25 mcg Intravenous Q2H PRN Shalhoub, Sherryll Burger, MD       Or   fentaNYL (SUBLIMAZE) injection 50 mcg  50 mcg Intravenous Q2H PRN Vernelle Emerald, MD   50 mcg at 02/06/21 2013   hydrALAZINE (APRESOLINE) tablet 75 mg  75 mg Oral Q8H Shalhoub, Sherryll Burger, MD   75 mg at 02/06/21 2204   iron sucrose (VENOFER) 50 mg in sodium chloride 0.9 % 100 mL IVPB  50 mg Intravenous Q Wed-HD Valentina Gu, NP   Held at 02/06/21 1214   labetalol (NORMODYNE) injection 20 mg  20 mg Intravenous Q2H PRN Shalhoub, Sherryll Burger, MD       lidocaine (LIDODERM) 5 % 1 patch  1 patch Transdermal Q24H Shalhoub, Sherryll Burger, MD   1 patch at 02/07/21 0900   metoprolol succinate (TOPROL-XL) 24 hr tablet 12.5 mg  12.5 mg Oral Daily Shalhoub, Sherryll Burger, MD       mirtazapine (REMERON) tablet 15 mg  15 mg Oral QHS Vernelle Emerald, MD   15 mg at 02/06/21 2204   ondansetron (ZOFRAN) tablet 4 mg  4 mg Oral Q6H PRN Vernelle Emerald, MD       Or   ondansetron [facility]) injection 4 mg  4 mg Intravenous Q6H PRN  Vernelle Emerald, MD       [START ON 02/09/2021] pantoprazole (PROTONIX) injection 40 mg  40  mg Intravenous Q12H Shalhoub, Sherryll Burger, MD       polyethylene glycol (MIRALAX / GLYCOLAX) packet 17 g  17 g Oral Daily PRN Shalhoub, Sherryll Burger, MD       [START ON 02/08/2021] remdesivir 100 mg in sodium chloride 0.9 % 100 mL IVPB  100 mg Intravenous Daily Bonnielee Haff, MD       remdesivir 200 mg in sodium chloride 0.9% 250 mL IVPB  200 mg Intravenous Once Bonnielee Haff, MD       sevelamer carbonate (RENVELA) tablet 800 mg  800 mg Oral TID WC Vernelle Emerald, MD   800 mg at 02/06/21 1814   Labs: Basic Metabolic Panel: Recent Labs  Lab 02/05/21 2047 02/06/21 0929 02/07/21 0432  NA 137 137 135  K 5.5* 5.3* 5.5*  CL 94* 97* 95*  CO2 22 24 20*  GLUCOSE 87 87 78  BUN 88* 102* 106*  CREATININE 17.59* 18.72* 20.08*  CALCIUM 10.2 9.1 8.9  PHOS  --  5.6*  --    Liver Function Tests: Recent Labs  Lab 02/05/21 2047 02/06/21 0929 02/07/21 0432  AST 19 19 13*  ALT 7 <5 7  ALKPHOS 50 36* 43  BILITOT 1.1 0.9 1.3*  PROT 8.0 6.0* 6.4*  ALBUMIN 4.1 3.0* 3.3*   Recent Labs  Lab 02/03/21 1632 02/05/21 2047  LIPASE 81* 59*   No results for input(s): AMMONIA in the last 168 hours. CBC: Recent Labs  Lab 02/03/21 1632 02/05/21 2355 02/06/21 0929 02/06/21 1608 02/06/21 1924 02/07/21 1042  WBC 6.0 6.6 5.7 6.6 6.1 6.9  NEUTROABS 3.5 3.7  --   --   --   --   HGB 12.2 9.8* 9.0* 9.1* 8.6* 8.6*  HCT 37.4 31.2* 28.4* 28.8* 26.8* 26.9*  MCV 89.0 90.4 89.3 88.6 88.7 90.3  PLT 323 274 280 254 242 245   Cardiac Enzymes: Recent Labs  Lab 02/06/21 0929  CKTOTAL 42   CBG: No results for input(s): GLUCAP in the last 168 hours. Iron Studies: No results for input(s): IRON, TIBC, TRANSFERRIN, FERRITIN in the last 72 hours. Studies/Results: CT ABDOMEN PELVIS W CONTRAST  Result Date: 02/06/2021 CLINICAL DATA:  Right lower quadrant pain 1 dark stool EXAM: CT ABDOMEN AND PELVIS WITH CONTRAST  TECHNIQUE: Multidetector CT imaging of the abdomen and pelvis was performed using the standard protocol following bolus administration of intravenous contrast. CONTRAST:  34m OMNIPAQUE IOHEXOL 350 MG/ML SOLN COMPARISON:  02/03/2021 FINDINGS: Lower chest: Lung bases are clear. Hepatobiliary: Liver is within normal limits. Vicarious excretion of contrast within the gallbladder. No intrahepatic or extrahepatic ductal dilatation. Pancreas: Within normal limits. Spleen: Within normal limits. Adrenals/Urinary Tract: Adrenal glands are within normal limits. Bilateral renal cortical atrophy with small renal cysts measuring up to 9 mm in the right lower kidney (series 4/image 27). No hydronephrosis. Excretory contrast in the bladder. Stomach/Bowel: Stomach is within normal limits. No evidence of bowel obstruction. Normal appendix (series 4/image 39). No colonic wall thickening or inflammatory changes. Vascular/Lymphatic: No evidence of abdominal aortic aneurysm. Atherosclerotic calcifications of the abdominal aorta and branch vessels. No suspicious abdominopelvic lymphadenopathy. Reproductive: Status post hysterectomy. Left ovary is within normal limits.  No right adnexal mass. Other: No abdominopelvic ascites. Musculoskeletal: Mild degenerative changes of the lower lumbar spine. IMPRESSION: No evidence of bowel obstruction. Normal appendix. No colonic wall thickening or inflammatory changes. No interval change from recent CT. Electronically Signed   By: SJulian HyM.D.   On: 02/06/2021 01:02  DG CHEST PORT 1 VIEW  Result Date: 02/07/2021 CLINICAL DATA:  Dyspnea, COVID positive EXAM: PORTABLE CHEST 1 VIEW COMPARISON:  Chest radiograph 11/10/2020 FINDINGS: The heart is enlarged, unchanged. The mediastinal contours are stable. There are patchy opacities in the lateral left midlung and lateral base. The right lung is clear. There is no pleural effusion or pneumothorax. There is no acute osseous abnormality.  IMPRESSION: 1. Patchy opacities in the left lung as above may reflect sequela of COVID. 2. Unchanged cardiomegaly Electronically Signed   By: Valetta Mole M.D.   On: 02/07/2021 08:35   US Abdomen Limited RUQ (LIVER/GB)  Result Date: 02/06/2021 CLINICAL DATA:  Right upper quadrant pain EXAM: ULTRASOUND ABDOMEN LIMITED RIGHT UPPER QUADRANT COMPARISON:  CT 02/06/2021 FINDINGS: Gallbladder: No gallstones or wall thickening visualized. No sonographic Murphy sign noted by sonographer. Common bile duct: Diameter: 1.6 mm Liver: No focal lesion identified. Within normal limits in parenchymal echogenicity. Portal vein is patent on color Doppler imaging with normal direction of blood flow towards the liver. Other: Right kidney is echogenic IMPRESSION: 1. Negative for gallstones. 2. Echogenic right kidney consistent with medical renal disease. Electronically Signed   By: Donavan Foil M.D.   On: 02/06/2021 16:05    ROS: As per HPI otherwise negative.  Physical Exam: Vitals:   02/07/21 1144 02/07/21 1248 02/07/21 1258 02/07/21 1306  BP: (!) 179/70 (!) 194/66 (!) 168/59 (!) 159/59  Pulse: 60 80 70 71  Resp: '20 16 16 14  '$ Temp: (!) 96.5 F (35.8 C) (!) 96.8 F (36 C)    TempSrc: Temporal Temporal    SpO2: 97% 100% 99% 98%  Weight:      Height:         Physical exam deferred D/T COVID 19. Patient not seen due to being COVID 19 patient to reduce risk of transmission to staff and conserve supplies.   HD orders: MWF [facility] 3.5 hours 160 NRe 350/500 51 kg 2.0K/2.0 Ca UFP 4 AVF -Heparin 2000 units initial bolus Heparin 1000 units IV mid run -Mircera 100 mcg IV q 2 weeks (last dose 01/28/2021) -Venofer 50 mg IV q week -Hectorol 3 mcg IV TIW  Assessment/Plan:  Melanotic Stools: GI consulted. Went to EGD today. Three small angioectasias with bleeding on contact were found in the duodenal bulb and in the second portion of the duodenum. Treated with APC and endoclip. HGB stable. Per primary R Flank Pain-CT scan  without acute abnormalities. Per primary.  COVID 19 PNA: Started on Remdesivir. Per primary  ESRD - Missed HD 02/06/2021. HD today off schedule. Short HD 02/08/2021 to resume MWF Schedule.   Hypertension/volume  -Noted to be hypertensive. Home medications have been resumed. UF as tolerated.   Anemia  -HGB 8.6. ESA not due yet. Follow HGB. Transfuse if HGB 7 or less.  Metabolic bone disease -  [id number] at goal. Continue binder, VDRA.   Nutrition - NPO at present 9.    H/O Chronic pancreatitis  Reynald Woods H. Owens Shark, NP-C 02/07/2021, 1:13 PM  [place]

## 2021-02-07 NOTE — Interval H&P Note (Signed)
History and Physical Interval Note:  02/07/2021 11:43 AM  Ashley [name]  has presented today for surgery, with the diagnosis of nausea, vomiting, melena.  The various methods of treatment have been discussed with the patient and family. After consideration of risks, benefits and other options for treatment, the patient has consented to  Procedure(s): ESOPHAGOGASTRODUODENOSCOPY (EGD) WITH PROPOFOL (N/A) as a surgical intervention.  The patient's history has been reviewed, patient examined, no change in status, stable for surgery.  I have reviewed the patient's chart and labs.  Questions were answered to the patient's satisfaction.     [name]

## 2021-02-07 NOTE — Anesthesia Preprocedure Evaluation (Addendum)
Anesthesia Evaluation  Patient identified by MRN, date of birth, ID band Patient awake    Reviewed: Allergy & Precautions, NPO status , Patient's Chart, lab work & pertinent test results  History of Anesthesia Complications (+) MALIGNANT HYPERTHERMIA  Airway Mallampati: II  TM Distance: >3 FB Neck ROM: Full    Dental no notable dental hx.    Pulmonary Current Smoker and Patient abstained from smoking.,  + COVID   Pulmonary exam normal breath sounds clear to auscultation       Cardiovascular hypertension, Pt. on medications Normal cardiovascular exam Rhythm:Regular Rate:Normal    1. Anterior and anterolateral hypokinesis. Left ventricular ejection  fraction, by estimation, is 40 to 45%. The left ventricle has mildly  decreased function. The left ventricle demonstrates regional wall motion  abnormalities (see scoring  diagram/findings for description). There is moderate concentric left  ventricular hypertrophy. Left ventricular diastolic parameters are  consistent with Grade I diastolic dysfunction (impaired relaxation).  2. Right ventricular systolic function is normal. The right ventricular  size is normal. There is mildly elevated pulmonary artery systolic  pressure.  3. Left atrial size was severely dilated.  4. Right atrial size was severely dilated.  5. The mitral valve is normal in structure. Mild mitral valve  regurgitation. No evidence of mitral stenosis.  6. The aortic valve is tricuspid. Aortic valve regurgitation is not  visualized. No aortic stenosis is present.  7. The inferior vena cava is normal in size with greater than 50%  respiratory variability, suggesting right atrial pressure of 3 mmHg.    Neuro/Psych Cervical radiculopathy  Neuromuscular disease CVA, Residual Symptoms negative psych ROS   GI/Hepatic hiatal hernia, PUD, GERD  ,(+)     substance abuse  cocaine use and marijuana use,  Hepatitis -, Cpancreatitis   Endo/Other  hyperlipidemia  Renal/GU DialysisRenal disease  negative genitourinary   Musculoskeletal  (+) Arthritis ,   Abdominal   Peds negative pediatric ROS (+)  Hematology negative hematology ROS (+)   Anesthesia Other Findings   Reproductive/Obstetrics negative OB ROS                            Anesthesia Physical Anesthesia Plan  ASA: 4  Anesthesia Plan:    Post-op Pain Management:    Induction:   PONV Risk Score and Plan:   Airway Management Planned: Natural Airway and Simple Face Mask  Additional Equipment:   Intra-op Plan:   Post-operative Plan:   Informed Consent: I have reviewed the patients History and Physical, chart, labs and discussed the procedure including the risks, benefits and alternatives for the proposed anesthesia with the patient or authorized representative who has indicated his/her understanding and acceptance.       Plan Discussed with: Anesthesiologist  Anesthesia Plan Comments:        Anesthesia Quick Evaluation

## 2021-02-07 NOTE — Progress Notes (Signed)
Initial Nutrition Assessment  DOCUMENTATION CODES:   Not applicable  INTERVENTION:   -RD will follow for diet advancement and add supplements as appropriate  NUTRITION DIAGNOSIS:   Increased nutrient needs related to chronic illness (ESRD on HD) as evidenced by estimated needs.  GOAL:   Patient will meet greater than or equal to 90% of their needs  MONITOR:   PO intake, Supplement acceptance, Labs, Weight trends, Skin, I & O's  REASON FOR ASSESSMENT:   Malnutrition Screening Tool    ASSESSMENT:   70 year old female with past medical history of end-stage renal disease (MWF HD), hypertension, chronic pancreatitis, hyperlipidemia, previous stroke (2019), nicotine dependence presenting with multiple complaints including of right flank pain nausea vomiting and melena  Pt admitted with acute upper GIB.   Reviewed I/O's: +480 ml x 24 hours  Spoke with pt at bedside, who was sleepy and responded mostly to close ended questions. She reports that she has been unable to keep food and liquids down over the past week and has a poor appetite at baseline. She was unable to provide diet recall despite probing. She is currently NPO for EGD today.   Pt endorses wt loss, but unsure how much. She reports her EDW is "50 something". She does not believe her EDW has been adjusted recently. Reviewed wt hx; no wt loss noted over the past 2 months.   Discussed importance of good meal and supplement intake to promote healing. She is amenable to supplements.    Medications reviewed and include sensipar, remeron, renvela, and remdesivir.   Labs reviewed: K: 5.5.   NUTRITION - FOCUSED PHYSICAL EXAM:  Flowsheet Row Most Recent Value  Orbital Region No depletion  Upper Arm Region Mild depletion  Thoracic and Lumbar Region No depletion  Buccal Region No depletion  Temple Region No depletion  Clavicle Bone Region No depletion  Clavicle and Acromion Bone Region No depletion  Scapular Bone Region  No depletion  Dorsal Hand No depletion  Patellar Region Mild depletion  Anterior Thigh Region Mild depletion  Posterior Calf Region Mild depletion  Edema (RD Assessment) None  Hair Reviewed  Eyes Reviewed  Mouth Reviewed  Skin Reviewed  Nails Reviewed       Diet Order:   Diet Order             Diet Heart Room service appropriate? Yes; Fluid consistency: Thin  Diet effective now                   EDUCATION NEEDS:   Education needs have been addressed  Skin:  Skin Assessment: Reviewed RN Assessment  Last BM:  02/06/21  Height:   Ht Readings from Last 1 Encounters:  02/06/21 '5\' 2"'$  (1.575 m)    Weight:   Wt Readings from Last 1 Encounters:  02/07/21 55.3 kg    Ideal Body Weight:  50 kg  BMI:  Body mass index is 22.3 kg/m.  Estimated Nutritional Needs:   Kcal:  1650-1850  Protein:  85-100 grams  Fluid:  1000 ml + UOP    Loistine Chance, RD, LDN, [place] Registered Dietitian II Certified Diabetes Care and Education Specialist Please refer to [facility] for RD and/or RD on-call/weekend/after hours pager

## 2021-02-07 NOTE — Op Note (Signed)
[facility] Patient Name: Ashley [name] Procedure Date : 02/07/2021 MRN: [id number] Attending MD: [name] , MD Date of Birth: 1950-06-11 CSN: [id number] Age: 70 Admit Type: Inpatient Procedure:                Upper GI endoscopy Indications:              Melena, chronic anemia Providers:                [name], MD, Burtis Junes, RN, Tyrone Apple, Technician, Sampson Si, CRNA Referring MD:              Medicines:                Monitored Anesthesia Care Complications:            No immediate complications. Estimated blood loss:                            None. Estimated Blood Loss:     Estimated blood loss: none. Procedure:                Pre-Anesthesia Assessment:                           - Prior to the procedure, a History and Physical                            was performed, and patient medications and                            allergies were reviewed. The patient's tolerance of                            previous anesthesia was also reviewed. The risks                            and benefits of the procedure and the sedation                            options and risks were discussed with the patient.                            All questions were answered, and informed consent                            was obtained. Prior Anticoagulants: The patient has                            taken no previous anticoagulant or antiplatelet                            agents. ASA Grade Assessment: III - A patient with  severe systemic disease. After reviewing the risks                            and benefits, the patient was deemed in                            satisfactory condition to undergo the procedure.                           After obtaining informed consent, the endoscope was                            passed under direct vision. Throughout the                            procedure, the patient's  blood pressure, pulse, and                            oxygen saturations were monitored continuously. The                            [id number] Olympus endoscope was introduced                            through the mouth, and advanced to the second part                            of duodenum. The upper GI endoscopy was                            accomplished without difficulty. The patient                            tolerated the procedure well. Scope In: Scope Out: Findings:      No recent or old blood in the UGI tract.      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 I treated each       with APC cautery and they all bled during treatment. One (in the second       portion of the duodenum) bled briskly enough after APC application that       I decided to place a single endoclip at the site with immediate       hemostasis.      The exam was otherwise without abnormality. Impression:               - Three small angioectasias with bleeding on                            contact were found in the duodenal bulb and in the                            second portion of the duodenum. I treated each with  APC cautery and they all bled during treatment. One                            (in the second portion of the duodenum) bled                            briskly enough after APC application that I decided                            to place a single endoclip at the site with                            immediate hemostasis.                           - The examination was otherwise normal. Recommendation:           - Will allow solid food now.                           - She should continue once daily PPI (her usual                            outpatient dose).                           - If she is felt to be bleeding further please call                            or page. If  that is the case will consider small                            bowel capsule vs. direct to enteroscopy.                           - OK to d/c today from my perspective and follow up                            at our office PRN Procedure Code(s):        --- Professional ---                           [phone], Esophagogastroduodenoscopy, flexible,                            transoral; with control of bleeding, any method Diagnosis Code(s):        --- Professional ---                           K31.819, Angiodysplasia of stomach and duodenum                            without bleeding  K92.1, Melena (includes Hematochezia) CPT copyright 2019 American [employer] Association. All rights reserved. The codes documented in this report are preliminary and upon coder review may  be revised to meet current compliance requirements. [name], MD 02/07/2021 12:47:49 PM This report has been signed electronically. Number of Addenda: 0

## 2021-02-07 NOTE — Progress Notes (Signed)
[facility] PROGRESS NOTE   Ashley [name] [id number] DOB: [birth year] DOA: 02/05/2021  PCP: Sonia Side., FNP  Brief History/Interval Summary: 70 year old female with past medical history of end-stage renal disease (MWF HD), hypertension, chronic pancreatitis, hyperlipidemia, previous stroke (2019), nicotine dependence presenting with multiple complaints including of right flank pain nausea vomiting and melena.  Symptoms were associated with nonbilious emesis.  Initially presented on 8/28 to the emergency department.  She underwent CT scan which did not show any acute findings.  And then on 8/29 pain was severe enough that she missed hemodialysis.  And then presented back to the ED.  CT scan was repeated which again did not show any acute findings.  Rectal examination was done by ED provider which showed black stool which was heme positive.  She was hospitalized for further management.  Consultants: Nephrology.  Gastroenterology  Procedures: EGD is planned for today.  Hemodialysis per nephrology  Antibiotics: Anti-infectives (From admission, onward)    Start     Dose/Rate Route Frequency Ordered Stop   02/08/21 1000  [MAR Hold]  remdesivir 100 mg in sodium chloride 0.9 % 100 mL IVPB        (MAR Hold since Thu 02/07/2021 at 1141.Hold Reason: Transfer to a Procedural area)   100 mg 200 mL/hr over 30 Minutes Intravenous Daily 02/07/21 1037 02/12/21 0959   02/07/21 1130  [MAR Hold]  remdesivir 200 mg in sodium chloride 0.9% 250 mL IVPB        (MAR Hold since Thu 02/07/2021 at 1141.Hold Reason: Transfer to a Procedural area)   200 mg 580 mL/hr over 30 Minutes Intravenous Once 02/07/21 1037         Subjective/Interval History: Patient mentions one of her friends and her apartment complex was diagnosed with COVID last week.  However she has not been in contact with him since then.  She developed dry cough 2 to 3 days ago.  Denies any shortness of breath or chest pain. Denies any  further episodes of black-colored stool.  Overall she feels better.  Right flank pain has resolved.    Assessment/Plan:  Melanotic stool/concern for upper GI bleed Patient with several day history of melanotic stool.  She was noted to have black stool on rectal exam which was heme positive.   Gastroenterology was consulted.  Patient placed on PPI.  EGD is planned for today.    Pneumonia due to COVID-19 Noted to be positive for COVID-19 yesterday.  Had not mentioned any respiratory symptoms at the time of admission.  Mentions today that she has noticed a dry cough over the last 2 to 3 days.  Had exposure in her apartment complex last week.  Chest x-ray showed left-sided opacities.  She has minimal oxygen requirements.  Cannot give back fluid due to end-stage renal disease.  We will place her on Remdesivir.  We will check inflammatory markers.  Consider steroids.    Right-sided flank pain/right upper quadrant pain Etiology is unclear.  Mild elevation in lipase is likely due to history of chronic pancreatitis.  LFTs otherwise normal.  CT scan did not reveal any abnormal findings in the gallbladder.  Right upper quadrant ultrasound does not show any gallstones or any other abnormality.  Pain either is referred from stomach or could be musculoskeletal.  Seems to have improved today.  Continue to monitor.    End-stage renal disease/hyperkalemia She undergoes dialysis on a Monday Wednesday Friday schedule.  Nephrology is following.  Potassium noted to  be elevated.  Could not be dialyzed yesterday.  Plan is for dialysis today.  Hypertensive urgency Continue antihypertensives including amlodipine, clonidine, hydralazine and metoprolol.    Hyperlipidemia Continue statin.  Chronic pancreatitis Apparently confirmed by EUS in 2021.  Anemia of chronic disease Actually close to her baseline.  Transfuse if less than 7.  DVT Prophylaxis: SCDs Code Status: Full code Family Communication: Discussed  with patient Disposition Plan: Hopefully return home when improved.  Status is: Inpatient  Remains inpatient appropriate because:Ongoing diagnostic testing needed not appropriate for outpatient work up, IV treatments appropriate due to intensity of illness or inability to take PO, and Inpatient level of care appropriate due to severity of illness  Dispo:  Patient From: Home  Planned Disposition: Home  Medically stable for discharge: No             Medications: Scheduled:  [MAR Hold] amLODipine  10 mg Oral Q1200   [MAR Hold] atorvastatin  20 mg Oral QHS   [MAR Hold] Chlorhexidine Gluconate Cloth  6 each Topical Q0600   [MAR Hold] cinacalcet  30 mg Oral QHS   [MAR Hold] cloNIDine  0.1 mg Oral BID   [MAR Hold] doxercalciferol  3 mcg Oral Q M,W,F-HD   [MAR Hold] feeding supplement  1 Container Oral TID BM   [MAR Hold] hydrALAZINE  75 mg Oral Q8H   [MAR Hold] lidocaine  1 patch Transdermal Q24H   [MAR Hold] metoprolol succinate  12.5 mg Oral Daily   [MAR Hold] mirtazapine  15 mg Oral QHS   [MAR Hold] pantoprazole  40 mg Intravenous Q12H   [MAR Hold] sevelamer carbonate  800 mg Oral TID WC   Continuous:  [MAR Hold] iron sucrose Stopped (02/06/21 1214)   [MAR Hold] remdesivir 100 mg in NS 100 mL     [MAR Hold] remdesivir 200 mg in sodium chloride 0.9% 250 mL IVPB     PRN:[MAR Hold] acetaminophen **OR** [MAR Hold] acetaminophen, [MAR Hold] fentaNYL (SUBLIMAZE) injection **OR** [MAR Hold] fentaNYL (SUBLIMAZE) injection, [MAR Hold] labetalol, [MAR Hold] ondansetron **OR** [MAR Hold] ondansetron (ZOFRAN) IV, [MAR Hold] polyethylene glycol   Objective:  Vital Signs  Vitals:   02/07/21 0400 02/07/21 0817 02/07/21 0948 02/07/21 1144  BP: (!) 160/60 (!) 178/71 (!) 142/57 (!) 179/70  Pulse: 64 62 (!) 56 60  Resp: '13 15 16 20  '$ Temp: 98.5 F (36.9 C) 98.3 F (36.8 C)  (!) 96.5 F (35.8 C)  TempSrc: Oral Oral  Temporal  SpO2: 96% 98% 96% 97%  Weight:      Height:         Intake/Output Summary (Last 24 hours) at 02/07/2021 1149 Last data filed at 02/07/2021 0400 Gross per 24 hour  Intake 480 ml  Output --  Net 480 ml   Filed Weights   02/06/21 0227  Weight: 53.1 kg    General appearance: Awake alert.  In no distress Resp: Normal effort at rest.  Clear to auscultation bilaterally Cardio: S1-S2 is normal regular.  No S3-S4.  No rubs murmurs or bruit GI: Abdomen is soft.  nontender today.  No masses organomegaly.  Bowel sounds present. Extremities: No edema.  Full range of motion of lower extremities. Neurologic: Alert and oriented x3.  No focal neurological deficits.     Lab Results:  Data Reviewed: I have personally reviewed following labs and imaging studies  CBC: Recent Labs  Lab 02/03/21 1632 02/05/21 2355 02/06/21 0929 02/06/21 1608 02/06/21 1924 02/07/21 1042  WBC 6.0 6.6 5.7  6.6 6.1 6.9  NEUTROABS 3.5 3.7  --   --   --   --   HGB 12.2 9.8* 9.0* 9.1* 8.6* 8.6*  HCT 37.4 31.2* 28.4* 28.8* 26.8* 26.9*  MCV 89.0 90.4 89.3 88.6 88.7 90.3  PLT 323 274 280 254 242 245     Basic Metabolic Panel: Recent Labs  Lab 02/03/21 1632 02/05/21 2047 02/06/21 0929 02/07/21 0432  NA 137 137 137 135  K 4.6 5.5* 5.3* 5.5*  CL 94* 94* 97* 95*  CO2 '26 22 24 '$ 20*  GLUCOSE 93 87 87 78  BUN 48* 88* 102* 106*  CREATININE 13.33* 17.59* 18.72* 20.08*  CALCIUM 10.0 10.2 9.1 8.9  MG  --   --  2.4  --   PHOS  --   --  5.6*  --      GFR: Estimated Creatinine Clearance: 2 mL/min (A) (by C-G formula based on SCr of 20.08 mg/dL (H)).  Liver Function Tests: Recent Labs  Lab 02/03/21 1632 02/05/21 2047 02/06/21 0929 02/07/21 0432  AST '22 19 19 '$ 13*  ALT 9 7 <5 7  ALKPHOS 45 50 36* 43  BILITOT 0.5 1.1 0.9 1.3*  PROT 7.9 8.0 6.0* 6.4*  ALBUMIN 3.9 4.1 3.0* 3.3*     Recent Labs  Lab 02/03/21 1632 02/05/21 2047  LIPASE 81* 59*      Cardiac Enzymes: Recent Labs  Lab 02/06/21 0929  CKTOTAL 42       Recent Results (from the  past 240 hour(s))  SARS CORONAVIRUS 2 (TAT 6-24 HRS) Nasopharyngeal Nasopharyngeal Swab     Status: Abnormal   Collection Time: 02/06/21 12:05 AM   Specimen: Nasopharyngeal Swab  Result Value Ref Range Status   SARS Coronavirus 2 POSITIVE (A) NEGATIVE Final    Comment: (NOTE) SARS-CoV-2 target nucleic acids are DETECTED.  The SARS-CoV-2 RNA is generally detectable in upper and lower respiratory specimens during the acute phase of infection. Positive results are indicative of the presence of SARS-CoV-2 RNA. Clinical correlation with patient history and other diagnostic information is  necessary to determine patient infection status. Positive results do not rule out bacterial infection or co-infection with other viruses.  The expected result is Negative.  Fact Sheet for Patients: SugarRoll.be  Fact Sheet for Healthcare Providers: https://www.woods-mathews.com/  This test is not yet approved or cleared by the [place] FDA and  has been authorized for detection and/or diagnosis of SARS-CoV-2 by FDA under an Emergency Use Authorization (EUA). This EUA will remain  in effect (meaning this test can be used) for the duration of the COVID-19 declaration under Section 564(b)(1) of the Act, 21 U. S.C. section 360bbb-3(b)(1), unless the authorization is terminated or revoked sooner.   Performed at [facility], [place] [address], [place], [place] [address]        Radiology Studies: CT ABDOMEN PELVIS W CONTRAST  Result Date: 02/06/2021 CLINICAL DATA:  Right lower quadrant pain 1 dark stool EXAM: CT ABDOMEN AND PELVIS WITH CONTRAST TECHNIQUE: Multidetector CT imaging of the abdomen and pelvis was performed using the standard protocol following bolus administration of intravenous contrast. CONTRAST:  78m OMNIPAQUE IOHEXOL 350 MG/ML SOLN COMPARISON:  02/03/2021 FINDINGS: Lower chest: Lung bases are clear. Hepatobiliary: Liver is within normal  limits. Vicarious excretion of contrast within the gallbladder. No intrahepatic or extrahepatic ductal dilatation. Pancreas: Within normal limits. Spleen: Within normal limits. Adrenals/Urinary Tract: Adrenal glands are within normal limits. Bilateral renal cortical atrophy with small renal cysts measuring up  to 9 mm in the right lower kidney (series 4/image 27). No hydronephrosis. Excretory contrast in the bladder. Stomach/Bowel: Stomach is within normal limits. No evidence of bowel obstruction. Normal appendix (series 4/image 39). No colonic wall thickening or inflammatory changes. Vascular/Lymphatic: No evidence of abdominal aortic aneurysm. Atherosclerotic calcifications of the abdominal aorta and branch vessels. No suspicious abdominopelvic lymphadenopathy. Reproductive: Status post hysterectomy. Left ovary is within normal limits.  No right adnexal mass. Other: No abdominopelvic ascites. Musculoskeletal: Mild degenerative changes of the lower lumbar spine. IMPRESSION: No evidence of bowel obstruction. Normal appendix. No colonic wall thickening or inflammatory changes. No interval change from recent CT. Electronically Signed   By: Julian Hy M.D.   On: 02/06/2021 01:02   DG CHEST PORT 1 VIEW  Result Date: 02/07/2021 CLINICAL DATA:  Dyspnea, COVID positive EXAM: PORTABLE CHEST 1 VIEW COMPARISON:  Chest radiograph 11/10/2020 FINDINGS: The heart is enlarged, unchanged. The mediastinal contours are stable. There are patchy opacities in the lateral left midlung and lateral base. The right lung is clear. There is no pleural effusion or pneumothorax. There is no acute osseous abnormality. IMPRESSION: 1. Patchy opacities in the left lung as above may reflect sequela of COVID. 2. Unchanged cardiomegaly Electronically Signed   By: Valetta Mole M.D.   On: 02/07/2021 08:35   US Abdomen Limited RUQ (LIVER/GB)  Result Date: 02/06/2021 CLINICAL DATA:  Right upper quadrant pain EXAM: ULTRASOUND ABDOMEN LIMITED  RIGHT UPPER QUADRANT COMPARISON:  CT 02/06/2021 FINDINGS: Gallbladder: No gallstones or wall thickening visualized. No sonographic Murphy sign noted by sonographer. Common bile duct: Diameter: 1.6 mm Liver: No focal lesion identified. Within normal limits in parenchymal echogenicity. Portal vein is patent on color Doppler imaging with normal direction of blood flow towards the liver. Other: Right kidney is echogenic IMPRESSION: 1. Negative for gallstones. 2. Echogenic right kidney consistent with medical renal disease. Electronically Signed   By: Donavan Foil M.D.   On: 02/06/2021 16:05       LOS: 1 day   [facility] Pager on [email or web address]  02/07/2021, 11:49 AM

## 2021-02-07 NOTE — Progress Notes (Signed)
Patient's BP 178/71 this morning. Dr. [place] Pink notified. Administering scheduled amlodipine & clonidine. Holding metoprolol due to heart rates in the 50's. Will continue to monitor.  Hiram Comber, RN 02/07/2021 9:05 AM

## 2021-02-07 NOTE — Plan of Care (Signed)
  Problem: Clinical Measurements: Goal: Ability to maintain clinical measurements within normal limits will improve Outcome: Progressing Goal: Will remain free from infection Outcome: Progressing Goal: Diagnostic test results will improve Outcome: Progressing Goal: Respiratory complications will improve Outcome: Progressing Goal: Cardiovascular complication will be avoided Outcome: Progressing   Problem: Safety: Goal: Ability to remain free from injury will improve Outcome: Progressing   Problem: Education: Goal: Knowledge of risk factors and measures for prevention of condition will improve Outcome: Progressing   Problem: Respiratory: Goal: Will maintain a patent airway Outcome: Progressing Goal: Complications related to the disease process, condition or treatment will be avoided or minimized Outcome: Progressing

## 2021-02-07 NOTE — Transfer of Care (Signed)
Immediate Anesthesia Transfer of Care Note  Patient: Ashley [name]  Procedure(s) Performed: ESOPHAGOGASTRODUODENOSCOPY (EGD) WITH PROPOFOL HOT HEMOSTASIS (ARGON PLASMA COAGULATION/BICAP) HEMOSTASIS CLIP PLACEMENT  Patient Location: Endoscopy Unit  Anesthesia Type:General  Level of Consciousness: lethargic and responds to stimulation  Airway & Oxygen Therapy: Patient Spontanous Breathing and Patient connected to nasal cannula oxygen  Post-op Assessment: Report given to RN  Post vital signs: Reviewed and stable  Last Vitals:  Vitals Value Taken Time  BP    Temp    Pulse    Resp    SpO2      Last Pain:  Vitals:   02/07/21 1144  TempSrc: Temporal  PainSc: 0-No pain      Patients Stated Pain Goal: 3 ([phone])  Complications: No notable events documented.

## 2021-02-08 LAB — COMPREHENSIVE METABOLIC PANEL
ALT: 6 U/L (ref 0–44)
AST: 23 U/L (ref 15–41)
Albumin: 3.5 g/dL (ref 3.5–5.0)
Alkaline Phosphatase: 40 U/L (ref 38–126)
Anion gap: 17 — ABNORMAL HIGH (ref 5–15)
BUN: 33 mg/dL — ABNORMAL HIGH (ref 8–23)
CO2: 22 mmol/L (ref 22–32)
Calcium: 8.3 mg/dL — ABNORMAL LOW (ref 8.9–10.3)
Chloride: 96 mmol/L — ABNORMAL LOW (ref 98–111)
Creatinine, Ser: 9.62 mg/dL — ABNORMAL HIGH (ref 0.44–1.00)
GFR, Estimated: 4 mL/min — ABNORMAL LOW (ref >60)
Glucose, Bld: 77 mg/dL (ref 70–99)
Potassium: 5 mmol/L (ref 3.5–5.1)
Sodium: 135 mmol/L (ref 135–145)
Total Bilirubin: 1.4 mg/dL — ABNORMAL HIGH (ref 0.3–1.2)
Total Protein: 7 g/dL (ref 6.5–8.1)

## 2021-02-08 LAB — C-REACTIVE PROTEIN: CRP: <0.5 mg/dL (ref <1.0)

## 2021-02-08 LAB — CBC
HCT: 29.8 % — ABNORMAL LOW (ref 36.0–46.0)
Hemoglobin: 9.7 g/dL — ABNORMAL LOW (ref 12.0–15.0)
MCH: 29.1 pg (ref 26.0–34.0)
MCHC: 32.6 g/dL (ref 30.0–36.0)
MCV: 89.5 fL (ref 80.0–100.0)
Platelets: 210 10*3/uL (ref 150–400)
RBC: 3.33 MIL/uL — ABNORMAL LOW (ref 3.87–5.11)
RDW: 18.4 % — ABNORMAL HIGH (ref 11.5–15.5)
WBC: 5 10*3/uL (ref 4.0–10.5)
nRBC: 0 % (ref 0.0–0.2)

## 2021-02-08 MED ORDER — TRAMADOL HCL 50 MG PO TABS
50.0000 mg | ORAL_TABLET | Freq: Once | ORAL | Status: AC | PRN
  Administered 2021-02-08: 50 mg via ORAL
  Filled 2021-02-08: qty 1

## 2021-02-08 NOTE — Anesthesia Postprocedure Evaluation (Addendum)
Anesthesia Post Note  Patient: Ashley [name]  Procedure(s) Performed: ESOPHAGOGASTRODUODENOSCOPY (EGD) WITH PROPOFOL HOT HEMOSTASIS (ARGON PLASMA COAGULATION/BICAP) HEMOSTASIS CLIP PLACEMENT     Patient location during evaluation: PACU Anesthesia Type: General Level of consciousness: awake Pain management: pain level controlled Vital Signs Assessment: post-procedure vital signs reviewed and stable Respiratory status: spontaneous breathing and respiratory function stable Cardiovascular status: stable Postop Assessment: no apparent nausea or vomiting Anesthetic complications: no   No notable events documented.  Last Vitals:  Vitals:   02/08/21 1632 02/08/21 1807  BP: (!) 154/86   Pulse: 100   Resp: 15   Temp: 37.6 C   SpO2: 90% 98%    Last Pain:  Vitals:   02/08/21 1807  TempSrc:   PainSc: 9    Pain Goal: Patients Stated Pain Goal: 7 (02/08/21 1734)                 Merlinda Frederick

## 2021-02-08 NOTE — Progress Notes (Signed)
[facility] Progress Note   Subjective:  Seen in room. Denies CP/dyspnea. No abdpminal pain at the moment. For HD today.  Objective Vitals:   02/07/21 2000 02/07/21 2333 02/08/21 0419 02/08/21 0756  BP: (!) 162/87 (!) 169/79 (!) 145/63 (!) 164/84  Pulse: 98 83 76 98  Resp: '14 16 15 15  '$ Temp: 98 F (36.7 C) 99.3 F (37.4 C) 99 F (37.2 C) 99 F (37.2 C)  TempSrc: Oral Oral Oral Oral  SpO2: 100% 100% 100%   Weight:      Height:       Physical Exam General: Frail woman, NAD. Nasal O2 in place Heart: RRR; no murmur Lungs: CTA anteriorly Abdomen: soft Extremities: No LE edema Dialysis Access: L AVF + bruit  Additional Objective Labs: Basic Metabolic Panel: Recent Labs  Lab 02/06/21 0929 02/07/21 0432 02/08/21 0104  NA 137 135 135  K 5.3* 5.5* 5.0  CL 97* 95* 96*  CO2 24 20* 22  GLUCOSE 87 78 77  BUN 102* 106* 33*  CREATININE 18.72* 20.08* 9.62*  CALCIUM 9.1 8.9 8.3*  PHOS 5.6*  --   --    Liver Function Tests: Recent Labs  Lab 02/06/21 0929 02/07/21 0432 02/08/21 0104  AST 19 13* 23  ALT '5 7 6  '$ ALKPHOS 36* 43 40  BILITOT 0.9 1.3* 1.4*  PROT 6.0* 6.4* 7.0  ALBUMIN 3.0* 3.3* 3.5   Recent Labs  Lab 02/03/21 1632 02/05/21 2047  LIPASE 81* 59*   CBC: Recent Labs  Lab 02/03/21 1632 02/05/21 2355 02/06/21 0929 02/06/21 1608 02/06/21 1924 02/07/21 1042 02/08/21 0104  WBC 6.0 6.6 5.7 6.6 6.1 6.9 5.0  NEUTROABS 3.5 3.7  --   --   --   --   --   HGB 12.2 9.8* 9.0* 9.1* 8.6* 8.6* 9.7*  HCT 37.4 31.2* 28.4* 28.8* 26.8* 26.9* 29.8*  MCV 89.0 90.4 89.3 88.6 88.7 90.3 89.5  PLT 323 274 280 254 242 245 210   Studies/Results: DG CHEST PORT 1 VIEW  Result Date: 02/07/2021 CLINICAL DATA:  Dyspnea, COVID positive EXAM: PORTABLE CHEST 1 VIEW COMPARISON:  Chest radiograph 11/10/2020 FINDINGS: The heart is enlarged, unchanged. The mediastinal contours are stable. There are patchy opacities in the lateral left midlung and lateral base. The  right lung is clear. There is no pleural effusion or pneumothorax. There is no acute osseous abnormality. IMPRESSION: 1. Patchy opacities in the left lung as above may reflect sequela of COVID. 2. Unchanged cardiomegaly Electronically Signed   By: Valetta Mole M.D.   On: 02/07/2021 08:35   US Abdomen Limited RUQ (LIVER/GB)  Result Date: 02/06/2021 CLINICAL DATA:  Right upper quadrant pain EXAM: ULTRASOUND ABDOMEN LIMITED RIGHT UPPER QUADRANT COMPARISON:  CT 02/06/2021 FINDINGS: Gallbladder: No gallstones or wall thickening visualized. No sonographic Murphy sign noted by sonographer. Common bile duct: Diameter: 1.6 mm Liver: No focal lesion identified. Within normal limits in parenchymal echogenicity. Portal vein is patent on color Doppler imaging with normal direction of blood flow towards the liver. Other: Right kidney is echogenic IMPRESSION: 1. Negative for gallstones. 2. Echogenic right kidney consistent with medical renal disease. Electronically Signed   By: Donavan Foil M.D.   On: 02/06/2021 16:05    Medications:  iron sucrose Stopped (02/06/21 1214)   remdesivir 100 mg in NS 100 mL 100 mg (02/08/21 0815)    amLODipine  10 mg Oral Q1200   atorvastatin  20 mg Oral QHS   cinacalcet  30 mg  Oral QHS   cloNIDine  0.1 mg Oral BID   doxercalciferol  3 mcg Oral Q M,W,F-HD   feeding supplement  1 Container Oral TID BM   hydrALAZINE  75 mg Oral Q8H   lidocaine  1 patch Transdermal Q24H   metoprolol succinate  12.5 mg Oral Daily   mirtazapine  15 mg Oral QHS   pantoprazole  40 mg Oral Q0600   sevelamer carbonate  1,600 mg Oral TID WC    Dialysis Orders: MWF [facility] 3.5 hours 160 NRe 350/500 51 kg 2.0K/2.0 Ca UFP 4 AVF - Heparin 2000 units initial bolus Heparin 1000 units IV mid run - Mircera 100 mcg IV q 2 weeks (last dose 01/28/2021) - Venofer 50 mg IV q week - Hectorol 3 mcg IV TIW  Assessment/Plan:  UGIB: S/p EGD 9/1, 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  R Flank Pain: CT scan without acute abnormalities, better today.  COVID 19 PNA: Started on Remdesivir. Per primary  ESRD: HD 9/1 as make-up, now back to usual MWF schedule - HD today.  Hypertension/volume: BP slightly high, home medications have been resumed. UF as tolerated.   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 ESA due on 9/5.  Metabolic bone disease: Labs decent. Continue binder, VDRA, sensipar.   Nutrition: Continue protein supplement. 9.    Hx chronic pancreatitis  Veneta Penton, PA-C 02/08/2021, 11:06 AM  [employer]

## 2021-02-08 NOTE — Progress Notes (Signed)
[facility] PROGRESS NOTE   Ashley [name] [id number] DOB: Jun 09, 1951 DOA: 02/05/2021  PCP: Sonia Side., FNP  Brief History/Interval Summary: 70 year old female with past medical history of end-stage renal disease (MWF HD), hypertension, chronic pancreatitis, hyperlipidemia, previous stroke (2019), nicotine dependence presenting with multiple complaints including of right flank pain nausea vomiting and melena.  Symptoms were associated with nonbilious emesis.  Initially presented on 8/28 to the emergency department.  She underwent CT scan which did not show any acute findings.  And then on 8/29 pain was severe enough that she missed hemodialysis.  And then presented back to the ED.  CT scan was repeated which again did not show any acute findings.  Rectal examination was done by ED provider which showed black stool which was heme positive.  She was hospitalized for further management.  Consultants: Nephrology.  Gastroenterology  Procedures:  EGD 9/1 Impression:               - Three small angioectasias with bleeding on                            contact were found in the duodenal bulb and in the                            second portion of the duodenum. I treated each with                            APC cautery and they all bled during treatment. One                            (in the second portion of the duodenum) bled                            briskly enough after APC application that I decided                            to place a single endoclip at the site with                            immediate hemostasis.                           - The examination was otherwise normal. Recommendation:           - Will allow solid food now.                           - She should continue once daily PPI (her usual                            outpatient dose).                           - If she is felt to be bleeding further please call                            or page.  If that  is the case will consider small                            bowel capsule vs. direct to enteroscopy.   Antibiotics: Anti-infectives (From admission, onward)    Start     Dose/Rate Route Frequency Ordered Stop   02/08/21 1000  remdesivir 100 mg in sodium chloride 0.9 % 100 mL IVPB        100 mg 200 mL/hr over 30 Minutes Intravenous Daily 02/07/21 1037 02/12/21 0959   02/07/21 1130  remdesivir 200 mg in sodium chloride 0.9% 250 mL IVPB        200 mg 580 mL/hr over 30 Minutes Intravenous Once 02/07/21 1037 02/07/21 2310       Subjective/Interval History: Overall patient feels well.  Denies any abdominal pain.  The right flank pain has improved.  Denies any shortness of breath.  Cough is getting better.      Assessment/Plan:  Melanotic stool/concern for upper GI bleed Patient with several day history of melanotic stool.  She was noted to have black stool on rectal exam which was heme positive.   Gastroenterology was consulted.  Patient placed on PPI.   EGD reviewed AVMs which is the likely source of her GI bleed.  These were addressed during the EGD with cautery and clip.  There were noted to be stable.  Continue PPI.   Pneumonia due to COVID-19 Noted to be positive for COVID-19.  Patient had been experiencing a dry cough for 2 to 3 days prior to admission.  She had exposure to somebody with COVID a week prior to her admission. Cycle threshold was not available.  Cannot give Paxil due to end-stage renal disease.  Started on Remdesivir.  She is noted to be saturating in the late 90s on 2 L.  Hopeful that she can come off of oxygen today.  If she continues to improve then 3-day Remdesivir treatment may suffice.  CRP noted to be less than 0.5.  We will hold off on using steroids.     Right-sided flank pain/right upper quadrant pain Etiology is unclear.  Mild elevation in lipase is likely due to history of chronic pancreatitis.  LFTs otherwise normal.  CT scan did not reveal any abnormal  findings in the gallbladder.  Right upper quadrant ultrasound does not show any gallstones or any other abnormality.  Pain either is referred from stomach or could be musculoskeletal.  Seems to be improving.  Mobilize patient.     End-stage renal disease/hyperkalemia She undergoes dialysis on a Monday Wednesday Friday schedule.  Nephrology is following.  Was dialyzed yesterday.  Potassium is better.  Hypertensive urgency Continue antihypertensives including amlodipine, clonidine, hydralazine and metoprolol.    Hyperlipidemia Continue statin.  Chronic pancreatitis Apparently confirmed by EUS in 2021.  Anemia of chronic disease Actually close to her baseline.  Hemoglobin noted to be stable.  Transfuse if less than 7.  DVT Prophylaxis: SCDs Code Status: Full code Family Communication: Discussed with patient Disposition Plan: Hopefully return home when improved.  Status is: Inpatient  Remains inpatient appropriate because:Ongoing diagnostic testing needed not appropriate for outpatient work up, IV treatments appropriate due to intensity of illness or inability to take PO, and Inpatient level of care appropriate due to severity of illness  Dispo:  Patient From: Home  Planned Disposition: Home  Medically stable for discharge: No  Medications: Scheduled:  amLODipine  10 mg Oral Q1200   atorvastatin  20 mg Oral QHS   cinacalcet  30 mg Oral QHS   cloNIDine  0.1 mg Oral BID   doxercalciferol  3 mcg Oral Q M,W,F-HD   feeding supplement  1 Container Oral TID BM   hydrALAZINE  75 mg Oral Q8H   lidocaine  1 patch Transdermal Q24H   metoprolol succinate  12.5 mg Oral Daily   mirtazapine  15 mg Oral QHS   pantoprazole  40 mg Oral Q0600   sevelamer carbonate  1,600 mg Oral TID WC   Continuous:  iron sucrose Stopped (02/06/21 1214)   remdesivir 100 mg in NS 100 mL 100 mg (02/08/21 0815)   [id number] **OR** acetaminophen, fentaNYL (SUBLIMAZE) injection **OR**  fentaNYL (SUBLIMAZE) injection, labetalol, ondansetron **OR** ondansetron (ZOFRAN) IV, polyethylene glycol   Objective:  Vital Signs  Vitals:   02/07/21 2000 02/07/21 2333 02/08/21 0419 02/08/21 0756  BP: (!) 162/87 (!) 169/79 (!) 145/63 (!) 164/84  Pulse: 98 83 76 98  Resp: '14 16 15 15  '$ Temp: 98 F (36.7 C) 99.3 F (37.4 C) 99 F (37.2 C) 99 F (37.2 C)  TempSrc: Oral Oral Oral Oral  SpO2: 100% 100% 100%   Weight:      Height:        Intake/Output Summary (Last 24 hours) at 02/08/2021 1058 Last data filed at 02/07/2021 1800 Gross per 24 hour  Intake 200 ml  Output 2300 ml  Net -2100 ml    Filed Weights   02/06/21 0227 02/07/21 1420 02/07/21 1800  Weight: 53.1 kg 55.3 kg 52.5 kg    General appearance: Awake alert.  In no distress Resp: Normal effort at rest.  Coarse breath sounds bilaterally but no crackles or wheezing. Cardio: S1-S2 is normal regular.  No S3-S4.  No rubs murmurs or bruit GI: Abdomen is soft.  Nontender nondistended.  Bowel sounds are present normal.  No masses organomegaly Extremities: No edema.  Full range of motion of lower extremities. Neurologic: Alert and oriented x3.  No focal neurological deficits.     Lab Results:  Data Reviewed: I have personally reviewed following labs and imaging studies  CBC: Recent Labs  Lab 02/03/21 1632 02/05/21 2355 02/06/21 0929 02/06/21 1608 02/06/21 1924 02/07/21 1042 02/08/21 0104  WBC 6.0 6.6 5.7 6.6 6.1 6.9 5.0  NEUTROABS 3.5 3.7  --   --   --   --   --   HGB 12.2 9.8* 9.0* 9.1* 8.6* 8.6* 9.7*  HCT 37.4 31.2* 28.4* 28.8* 26.8* 26.9* 29.8*  MCV 89.0 90.4 89.3 88.6 88.7 90.3 89.5  PLT 323 274 280 254 242 245 210     Basic Metabolic Panel: Recent Labs  Lab 02/03/21 1632 02/05/21 2047 02/06/21 0929 02/07/21 0432 02/08/21 0104  NA 137 137 137 135 135  K 4.6 5.5* 5.3* 5.5* 5.0  CL 94* 94* 97* 95* 96*  CO2 '26 22 24 '$ 20* 22  GLUCOSE 93 87 87 78 77  BUN 48* 88* 102* 106* 33*  CREATININE 13.33*  17.59* 18.72* 20.08* 9.62*  CALCIUM 10.0 10.2 9.1 8.9 8.3*  MG  --   --  2.4  --   --   PHOS  --   --  5.6*  --   --      GFR: Estimated Creatinine Clearance: 4.2 mL/min (A) (by C-G formula based on SCr of 9.62 mg/dL (H)).  Liver Function Tests: Recent Labs  Lab 02/03/21  [id number] 02/05/21 2047 02/06/21 0929 02/07/21 0432 02/08/21 0104  AST '22 19 19 '$ 13* 23  ALT 9 7 '5 7 6  '$ ALKPHOS 45 50 36* 43 40  BILITOT 0.5 1.1 0.9 1.3* 1.4*  PROT 7.9 8.0 6.0* 6.4* 7.0  ALBUMIN 3.9 4.1 3.0* 3.3* 3.5     Recent Labs  Lab 02/03/21 1632 02/05/21 2047  LIPASE 81* 59*      Cardiac Enzymes: Recent Labs  Lab 02/06/21 0929  CKTOTAL 42       Recent Results (from the past 240 hour(s))  SARS CORONAVIRUS 2 (TAT 6-24 HRS) Nasopharyngeal Nasopharyngeal Swab     Status: Abnormal   Collection Time: 02/06/21 12:05 AM   Specimen: Nasopharyngeal Swab  Result Value Ref Range Status   SARS Coronavirus 2 POSITIVE (A) NEGATIVE Final    Comment: (NOTE) SARS-CoV-2 target nucleic acids are DETECTED.  The SARS-CoV-2 RNA is generally detectable in upper and lower respiratory specimens during the acute phase of infection. Positive results are indicative of the presence of SARS-CoV-2 RNA. Clinical correlation with patient history and other diagnostic information is  necessary to determine patient infection status. Positive results do not rule out bacterial infection or co-infection with other viruses.  The expected result is Negative.  Fact Sheet for Patients: SugarRoll.be  Fact Sheet for Healthcare Providers: https://www.woods-mathews.com/  This test is not yet approved or cleared by the [place] FDA and  has been authorized for detection and/or diagnosis of SARS-CoV-2 by FDA under an Emergency Use Authorization (EUA). This EUA will remain  in effect (meaning this test can be used) for the duration of the COVID-19 declaration under Section  564(b)(1) of the Act, 21 U. S.C. section 360bbb-3(b)(1), unless the authorization is terminated or revoked sooner.   Performed at [facility], [place] [address] [place],  [address]        Radiology Studies: DG CHEST PORT 1 VIEW  Result Date: 02/07/2021 CLINICAL DATA:  Dyspnea, COVID positive EXAM: PORTABLE CHEST 1 VIEW COMPARISON:  Chest radiograph 11/10/2020 FINDINGS: The heart is enlarged, unchanged. The mediastinal contours are stable. There are patchy opacities in the lateral left midlung and lateral base. The right lung is clear. There is no pleural effusion or pneumothorax. There is no acute osseous abnormality. IMPRESSION: 1. Patchy opacities in the left lung as above may reflect sequela of COVID. 2. Unchanged cardiomegaly Electronically Signed   By: Valetta Mole M.D.   On: 02/07/2021 08:35   US Abdomen Limited RUQ (LIVER/GB)  Result Date: 02/06/2021 CLINICAL DATA:  Right upper quadrant pain EXAM: ULTRASOUND ABDOMEN LIMITED RIGHT UPPER QUADRANT COMPARISON:  CT 02/06/2021 FINDINGS: Gallbladder: No gallstones or wall thickening visualized. No sonographic Murphy sign noted by sonographer. Common bile duct: Diameter: 1.6 mm Liver: No focal lesion identified. Within normal limits in parenchymal echogenicity. Portal vein is patent on color Doppler imaging with normal direction of blood flow towards the liver. Other: Right kidney is echogenic IMPRESSION: 1. Negative for gallstones. 2. Echogenic right kidney consistent with medical renal disease. Electronically Signed   By: Donavan Foil M.D.   On: 02/06/2021 16:05       LOS: 2 days   [facility] Pager on [email or web address]  02/08/2021, 10:58 AM

## 2021-02-09 DIAGNOSIS — R52 Pain, unspecified: Secondary | ICD-10-CM

## 2021-02-09 LAB — COMPREHENSIVE METABOLIC PANEL
ALT: 11 U/L (ref 0–44)
AST: 31 U/L (ref 15–41)
Albumin: 3.2 g/dL — ABNORMAL LOW (ref 3.5–5.0)
Alkaline Phosphatase: 38 U/L (ref 38–126)
Anion gap: 14 (ref 5–15)
BUN: 51 mg/dL — ABNORMAL HIGH (ref 8–23)
CO2: 25 mmol/L (ref 22–32)
Calcium: 8.6 mg/dL — ABNORMAL LOW (ref 8.9–10.3)
Chloride: 96 mmol/L — ABNORMAL LOW (ref 98–111)
Creatinine, Ser: 12.34 mg/dL — ABNORMAL HIGH (ref 0.44–1.00)
GFR, Estimated: 3 mL/min — ABNORMAL LOW (ref >60)
Glucose, Bld: 90 mg/dL (ref 70–99)
Potassium: 5.5 mmol/L — ABNORMAL HIGH (ref 3.5–5.1)
Sodium: 135 mmol/L (ref 135–145)
Total Bilirubin: 0.4 mg/dL (ref 0.3–1.2)
Total Protein: 6.8 g/dL (ref 6.5–8.1)

## 2021-02-09 LAB — CBC
HCT: 32.5 % — ABNORMAL LOW (ref 36.0–46.0)
Hemoglobin: 10.1 g/dL — ABNORMAL LOW (ref 12.0–15.0)
MCH: 27.7 pg (ref 26.0–34.0)
MCHC: 31.1 g/dL (ref 30.0–36.0)
MCV: 89.3 fL (ref 80.0–100.0)
Platelets: 243 10*3/uL (ref 150–400)
RBC: 3.64 MIL/uL — ABNORMAL LOW (ref 3.87–5.11)
RDW: 18.3 % — ABNORMAL HIGH (ref 11.5–15.5)
WBC: 8.2 10*3/uL (ref 4.0–10.5)
nRBC: 0.2 % (ref 0.0–0.2)

## 2021-02-09 LAB — C-REACTIVE PROTEIN: CRP: <0.5 mg/dL (ref <1.0)

## 2021-02-09 MED ORDER — PREDNISONE 5 MG PO TABS
30.0000 mg | ORAL_TABLET | Freq: Two times a day (BID) | ORAL | Status: DC
  Administered 2021-02-09 – 2021-02-12 (×7): 30 mg via ORAL
  Filled 2021-02-09 (×3): qty 1
  Filled 2021-02-09 (×2): qty 2
  Filled 2021-02-09 (×2): qty 1

## 2021-02-09 MED ORDER — OXYCODONE HCL 5 MG PO TABS
5.0000 mg | ORAL_TABLET | Freq: Four times a day (QID) | ORAL | Status: DC | PRN
  Administered 2021-02-09 – 2021-02-12 (×10): 5 mg via ORAL
  Filled 2021-02-09 (×10): qty 1

## 2021-02-09 MED ORDER — HYDROMORPHONE HCL 1 MG/ML IJ SOLN
0.5000 mg | Freq: Once | INTRAMUSCULAR | Status: AC
Start: 2021-02-09 — End: 2021-02-09
  Administered 2021-02-09: 0.5 mg via INTRAVENOUS
  Filled 2021-02-09: qty 0.5

## 2021-02-09 NOTE — Progress Notes (Signed)
PT Cancellation Note  Patient Details Name: Ashley [name] MRN: [id number] DOB: 1950/06/14   Cancelled Treatment:    Reason Eval/Treat Not Completed: Fatigue/lethargy limiting ability to participate.  Feeling tired and SOB, sats >90%.  Follow up as time and pt allow.   [name] 02/09/2021, 11:25 AM  [name], PT [facility] Acute Rehab Dept. Number: [place] and [place]

## 2021-02-09 NOTE — Progress Notes (Signed)
Patient refusing to eat and refusing lab draws due to pain. Per pt, this pain is generalized and is 10/10. Multiple different pain medications have been tried since yesterday, with no relief.   Will give new order for Dilaudid 0.5 mg IV x1 now.  Will continue trying to manage patient's pain level.

## 2021-02-09 NOTE — Plan of Care (Signed)
  Problem: Clinical Measurements: Goal: Will remain free from infection Outcome: Progressing Goal: Respiratory complications will improve Outcome: Progressing Goal: Cardiovascular complication will be avoided Outcome: Progressing   Problem: Safety: Goal: Ability to remain free from injury will improve Outcome: Progressing   Problem: Respiratory: Goal: Will maintain a patent airway Outcome: Progressing   Problem: Bowel/Gastric: Goal: Will show no signs and symptoms of gastrointestinal bleeding Outcome: Progressing   Problem: Fluid Volume: Goal: Will show no signs and symptoms of excessive bleeding Outcome: Progressing

## 2021-02-09 NOTE — Progress Notes (Signed)
Patient transported off unit to dialysis. 

## 2021-02-09 NOTE — Progress Notes (Signed)
Patient returned to room from dialysis, alert and stable. Pt complain of generalized pain, refusing to take tylenol because  pt said tylenol is not working for her. Pt was encouraged to take tylenol for now since end of shift before the day shift take over. Tylenol was administered at this time.

## 2021-02-09 NOTE — Progress Notes (Signed)
[facility] PROGRESS NOTE   Ashley [name] [id number] DOB: [birth year] DOA: 02/05/2021  PCP: Raymon Mutton., FNP  Brief History/Interval Summary: 70 year old female with past medical history of end-stage renal disease (MWF HD), hypertension, chronic pancreatitis, hyperlipidemia, previous stroke (2019), nicotine dependence presenting with multiple complaints including of right flank pain nausea vomiting and melena.  Symptoms were associated with nonbilious emesis.  Initially presented on 8/28 to the emergency department.  She underwent CT scan which did not show any acute findings.  And then on 8/29 pain was severe enough that she missed hemodialysis.  And then presented back to the ED.  CT scan was repeated which again did not show any acute findings.  Rectal examination was done by ED provider which showed black stool which was heme positive.  She was hospitalized for further management.  Consultants: Nephrology.  Gastroenterology  Procedures:  EGD 9/1 Impression:               - Three small angioectasias with bleeding on                            contact were found in the duodenal bulb and in the                            second portion of the duodenum. I treated each with                            APC cautery and they all bled during treatment. One                            (in the second portion of the duodenum) bled                            briskly enough after APC application that I decided                            to place a single endoclip at the site with                            immediate hemostasis.                           - The examination was otherwise normal. Recommendation:           - Will allow solid food now.                           - She should continue once daily PPI (her usual                            outpatient dose).                           - If she is felt to be bleeding further please call                            or page.  If that  is the case will consider small                            bowel capsule vs. direct to enteroscopy.   Antibiotics: Anti-infectives (From admission, onward)    Start     Dose/Rate Route Frequency Ordered Stop   02/08/21 1000  remdesivir 100 mg in sodium chloride 0.9 % 100 mL IVPB        100 mg 200 mL/hr over 30 Minutes Intravenous Daily 02/07/21 1037 02/12/21 0959   02/07/21 1130  remdesivir 200 mg in sodium chloride 0.9% 250 mL IVPB        200 mg 580 mL/hr over 30 Minutes Intravenous Once 02/07/21 1037 02/07/21 2310       Subjective/Interval History: Patient returned from hemodialysis early this morning.  According to nursing reports and per patient she has been experiencing quite severe generalized body aches.  She mentions that she has been unable to move around because of this severe pain.  She is very specific that the pain is not located to specific site.  No recent falls or injuries noted.  No nausea vomiting.  No chest pain.  Patient denies any history of gout or any other inflammatory or autoimmune condition.  Assessment/Plan:  Melanotic stool/concern for upper GI bleed Patient with several day history of melanotic stool.  She was noted to have black stool on rectal exam which was heme positive.   Gastroenterology was consulted.  Patient placed on PPI.   EGD reviewed AVMs which is the likely source of her GI bleed.  These were addressed during the EGD with cautery and clip.  There were noted to be stable.  Continue PPI.   Pneumonia due to COVID-19 Noted to be positive for COVID-19.  Patient had been experiencing a dry cough for 2 to 3 days prior to admission.  She had exposure to somebody with COVID a week prior to her admission. Cycle threshold was not available.  Cannot give Paxlovid due to end-stage renal disease.   Patient was started on Remdesivir.  Respiratory status noted to be stable.  We will give her just a 3-day course.  She had come off of oxygen yesterday but noted  to be back on it this morning.  Nurse will try to wean it off.  CRP has been noted to be less than 0.5.    Generalized body ache with inability to move Patient does not have any focal neurological deficits on examination.  She is very nonspecific regarding her symptoms.  Have to attribute this to acute COVID infection.  CRP however noted to be less than 0.5.  Patient does not give any history of gout or inflammatory or autoimmune conditions.  We will give her analgesic agents.  May not be unreasonable to give her a trial of steroids.  Prednisone has been initiated.  PT/OT evaluation.  Right-sided flank pain/right upper quadrant pain Etiology is unclear.  Mild elevation in lipase is likely due to history of chronic pancreatitis.  LFTs otherwise normal.  CT scan did not reveal any abnormal findings in the gallbladder.  Right upper quadrant ultrasound does not show any gallstones or any other abnormality.  Pain either is referred from stomach or could be musculoskeletal.  Not an active issue currently.  End-stage renal disease/hyperkalemia She undergoes dialysis on a Monday Wednesday Friday schedule.  Nephrology is following.  Potassium noted to be 5.5 this morning but it  appears that patient was dialyzed after this blood draw.    Hypertensive urgency Continue antihypertensives including amlodipine, clonidine, hydralazine and metoprolol.  Elevated blood pressure likely due to pain and discomfort.  Continue to monitor  Hyperlipidemia Continue statin.  Chronic pancreatitis Apparently confirmed by EUS in 2021.  Anemia of chronic disease Actually close to her baseline.  Hemoglobin noted to be stable.  Transfuse if less than 7.  DVT Prophylaxis: SCDs Code Status: Full code Family Communication: Discussed with patient Disposition Plan: To be determined.  PT and OT evaluation.  Status is: Inpatient  Remains inpatient appropriate because:Ongoing diagnostic testing needed not appropriate for  outpatient work up, IV treatments appropriate due to intensity of illness or inability to take PO, and Inpatient level of care appropriate due to severity of illness  Dispo:  Patient From: Home  Planned Disposition: Home  Medically stable for discharge: No             Medications: Scheduled:  amLODipine  10 mg Oral Q1200   atorvastatin  20 mg Oral QHS   cinacalcet  30 mg Oral QHS   cloNIDine  0.1 mg Oral BID   doxercalciferol  3 mcg Oral Q M,W,F-HD   feeding supplement  1 Container Oral TID BM   hydrALAZINE  75 mg Oral Q8H   lidocaine  1 patch Transdermal Q24H   metoprolol succinate  12.5 mg Oral Daily   mirtazapine  15 mg Oral QHS   pantoprazole  40 mg Oral Q0600   predniSONE  30 mg Oral BID WC   sevelamer carbonate  1,600 mg Oral TID WC   Continuous:  iron sucrose Stopped (02/06/21 1214)   remdesivir 100 mg in NS 100 mL 100 mg (02/09/21 0953)   UVO:[id number] **OR** acetaminophen, labetalol, ondansetron **OR** ondansetron (ZOFRAN) IV, oxyCODONE, polyethylene glycol   Objective:  Vital Signs  Vitals:   02/09/21 0452 02/09/21 0522 02/09/21 0537 02/09/21 0628  BP: 104/63 (!) 153/77 (!) 153/77 (!) 165/95  Pulse:    (!) 106  Resp: 14 16 14 15   Temp:   98.8 F (37.1 C) 99 F (37.2 C)  TempSrc:    Oral  SpO2:    100%  Weight:    53.8 kg  Height:        Intake/Output Summary (Last 24 hours) at 02/09/2021 1023 Last data filed at 02/09/2021 0537 Gross per 24 hour  Intake 169.69 ml  Output 500 ml  Net -330.31 ml    Filed Weights   02/07/21 1420 02/07/21 1800 02/09/21 0628  Weight: 55.3 kg 52.5 kg 53.8 kg    General appearance: Awake alert.  In no distress Resp: Clear to auscultation bilaterally.  Normal effort Cardio: S1-S2 is normal regular.  No S3-S4.  No rubs murmurs or bruit GI: Abdomen is soft.  Nontender nondistended.  Bowel sounds are present normal.  No masses organomegaly Extremities: No edema noted.  No joint swellings noted.  Has been able to  move her extremities though in a limited fashion.  Passive movement does not elicit any joint discomfort in bilateral lower extremities or upper extremities. Neurologic: Alert and oriented x3.  No focal neurological deficits.     Lab Results:  Data Reviewed: I have personally reviewed following labs and imaging studies  CBC: Recent Labs  Lab 02/03/21 1632 02/05/21 2355 02/06/21 0929 02/06/21 1608 02/06/21 1924 02/07/21 1042 02/08/21 0104 02/09/21 0049  WBC 6.0 6.6   < > 6.6 6.1 6.9 5.0 8.2  NEUTROABS 3.5 3.7  --   --   --   --   --   --  HGB 12.2 9.8*   < > 9.1* 8.6* 8.6* 9.7* 10.1*  HCT 37.4 31.2*   < > 28.8* 26.8* 26.9* 29.8* 32.5*  MCV 89.0 90.4   < > 88.6 88.7 90.3 89.5 89.3  PLT 323 274   < > 254 242 245 210 243   < > = values in this interval not displayed.     Basic Metabolic Panel: Recent Labs  Lab 02/05/21 2047 02/06/21 0929 02/07/21 0432 02/08/21 0104 02/09/21 0049  NA 137 137 135 135 135  K 5.5* 5.3* 5.5* 5.0 5.5*  CL 94* 97* 95* 96* 96*  CO2 22 24 20* 22 25  GLUCOSE 87 87 78 77 90  BUN 88* 102* 106* 33* 51*  CREATININE 17.59* 18.72* 20.08* 9.62* 12.34*  CALCIUM 10.2 9.1 8.9 8.3* 8.6*  MG  --  2.4  --   --   --   PHOS  --  5.6*  --   --   --      GFR: Estimated Creatinine Clearance: 3.3 mL/min (A) (by C-G formula based on SCr of 12.34 mg/dL (H)).  Liver Function Tests: Recent Labs  Lab 02/05/21 2047 02/06/21 0929 02/07/21 0432 02/08/21 0104 02/09/21 0049  AST 19 19 13* 23 31  ALT 7 5 7 6 11   ALKPHOS 50 36* 43 40 38  BILITOT 1.1 0.9 1.3* 1.4* 0.4  PROT 8.0 6.0* 6.4* 7.0 6.8  ALBUMIN 4.1 3.0* 3.3* 3.5 3.2*     Recent Labs  Lab 02/03/21 1632 02/05/21 2047  LIPASE 81* 59*      Cardiac Enzymes: Recent Labs  Lab 02/06/21 0929  CKTOTAL 42       Recent Results (from the past 240 hour(s))  SARS CORONAVIRUS 2 (TAT 6-24 HRS) Nasopharyngeal Nasopharyngeal Swab     Status: Abnormal   Collection Time: 02/06/21 12:05 AM    Specimen: Nasopharyngeal Swab  Result Value Ref Range Status   SARS Coronavirus 2 POSITIVE (A) NEGATIVE Final    Comment: (NOTE) SARS-CoV-2 target nucleic acids are DETECTED.  The SARS-CoV-2 RNA is generally detectable in upper and lower respiratory specimens during the acute phase of infection. Positive results are indicative of the presence of SARS-CoV-2 RNA. Clinical correlation with patient history and other diagnostic information is  necessary to determine patient infection status. Positive results do not rule out bacterial infection or co-infection with other viruses.  The expected result is Negative.  Fact Sheet for Patients: HairSlick.no  Fact Sheet for Healthcare Providers: quierodirigir.com  This test is not yet approved or cleared by the [place] FDA and  has been authorized for detection and/or diagnosis of SARS-CoV-2 by FDA under an Emergency Use Authorization (EUA). This EUA will remain  in effect (meaning this test can be used) for the duration of the COVID-19 declaration under Section 564(b)(1) of the Act, 21 U. S.C. section 360bbb-3(b)(1), unless the authorization is terminated or revoked sooner.   Performed at [facility], [address] [place], [place] [address]        Radiology Studies: No results found.     LOS: 3 days   Dakhari Zuver [employer] on [email or web address]  02/09/2021, 10:23 AM

## 2021-02-09 NOTE — Progress Notes (Signed)
PT Cancellation Note  Patient Details Name: Ashley [name] MRN: [id number] DOB: [birth year]   Cancelled Treatment:    Reason Eval/Treat Not Completed: Fatigue/lethargy limiting ability to participate;Pain limiting ability to participate.  Severe pain is not responding to meds, nursing continuing to try to manage it.  Follow up tomorrow.   [name] 02/09/2021, 2:34 PM  [name], PT MS Acute Rehab Dept. Number: [name] [facility] [name]

## 2021-02-09 NOTE — Progress Notes (Signed)
[facility] Progress Note   Subjective:  Seen in room - looks quite miserable. Whimpering that she hurts all over. D/w RN who has been communicating with primary MD - no improvement with dilaudid. Now changed to oxycodone and started course of steroids today. S/p HD overnight - around 2am.  Objective Vitals:   02/09/21 0537 02/09/21 0628 02/09/21 0800 02/09/21 0855  BP: (!) 153/77 (!) 165/95 (!) 146/85 137/76  Pulse:  (!) 106 (!) 109 100  Resp: '14 15 12 16  '$ Temp: 98.8 F (37.1 C) 99 F (37.2 C) 99 F (37.2 C) 98.8 F (37.1 C)  TempSrc:  Oral Oral Oral  SpO2:  100% 100% 90%  Weight:  53.8 kg    Height:       Physical Exam General: Frail woman, NAD. Nasal O2 in place Heart: RRR; no murmur Lungs: CTA anteriorly Abdomen: soft Extremities: No LE edema Dialysis Access: L AVF + bruit  Additional Objective Labs: Basic Metabolic Panel: Recent Labs  Lab 02/06/21 0929 02/07/21 0432 02/08/21 0104 02/09/21 0049  NA 137 135 135 135  K 5.3* 5.5* 5.0 5.5*  CL 97* 95* 96* 96*  CO2 24 20* 22 25  GLUCOSE 87 78 77 90  BUN 102* 106* 33* 51*  CREATININE 18.72* 20.08* 9.62* 12.34*  CALCIUM 9.1 8.9 8.3* 8.6*  PHOS 5.6*  --   --   --    Liver Function Tests: Recent Labs  Lab 02/07/21 0432 02/08/21 0104 02/09/21 0049  AST 13* 23 31  ALT '7 6 11  '$ ALKPHOS 43 40 38  BILITOT 1.3* 1.4* 0.4  PROT 6.4* 7.0 6.8  ALBUMIN 3.3* 3.5 3.2*   Recent Labs  Lab 02/03/21 1632 02/05/21 2047  LIPASE 81* 59*   CBC: Recent Labs  Lab 02/03/21 1632 02/05/21 2355 02/06/21 0929 02/06/21 1608 02/06/21 1924 02/07/21 1042 02/08/21 0104 02/09/21 0049  WBC 6.0 6.6   < > 6.6 6.1 6.9 5.0 8.2  NEUTROABS 3.5 3.7  --   --   --   --   --   --   HGB 12.2 9.8*   < > 9.1* 8.6* 8.6* 9.7* 10.1*  HCT 37.4 31.2*   < > 28.8* 26.8* 26.9* 29.8* 32.5*  MCV 89.0 90.4   < > 88.6 88.7 90.3 89.5 89.3  PLT 323 274   < > 254 242 245 210 243   < > = values in this interval not displayed.    Medications:  iron sucrose Stopped (02/06/21 1214)   remdesivir 100 mg in NS 100 mL 100 mg (02/09/21 0953)    amLODipine  10 mg Oral Q1200   atorvastatin  20 mg Oral QHS   cinacalcet  30 mg Oral QHS   cloNIDine  0.1 mg Oral BID   doxercalciferol  3 mcg Oral Q M,W,F-HD   feeding supplement  1 Container Oral TID BM   hydrALAZINE  75 mg Oral Q8H   lidocaine  1 patch Transdermal Q24H   metoprolol succinate  12.5 mg Oral Daily   mirtazapine  15 mg Oral QHS   pantoprazole  40 mg Oral Q0600   predniSONE  30 mg Oral BID WC   sevelamer carbonate  1,600 mg Oral TID WC   Dialysis Orders: MWF [facility] 3.5 hours 160 NRe 350/500 51 kg 2.0K/2.0 Ca UFP 4 AVF - Heparin 2000 units initial bolus Heparin 1000 units IV mid run - Mircera 100 mcg IV q 2 weeks (last dose 01/28/2021) - Venofer 50  mg IV q week - Hectorol 3 mcg IV TIW   Assessment/Plan:  UGIB: S/p EGD 9/1, found 3 small, bleeding AVMs, which were treated with APC and endoclip.  R Flank Pain: CT scan without acute abnormalities, better today.  COVID 19 PNA: Started on Remdesivir. Per primary.  Severe generalized aches: Starting course of steroids today.  ESRD: Continue HD per usual MWF schedule -  next 9/5. K 5.5 early this AM - I think this was pre-HD. Will repeat to assure came down.  Hypertension/volume: BP slightly high, home medications have been resumed. UF as tolerated.   Anemia of ESRD: Hgb 10.1, better. ESA due on 9/5.  Metabolic bone disease: Labs decent. Continue binder, VDRA, sensipar.   Nutrition: Continue protein supplement. 9.    Hx chronic pancreatitis  Veneta Penton, PA-C 02/09/2021, 10:44 AM  [facility]

## 2021-02-10 LAB — COMPREHENSIVE METABOLIC PANEL
ALT: 9 U/L (ref 0–44)
AST: 14 U/L — ABNORMAL LOW (ref 15–41)
Albumin: 3.2 g/dL — ABNORMAL LOW (ref 3.5–5.0)
Alkaline Phosphatase: 46 U/L (ref 38–126)
Anion gap: 15 (ref 5–15)
BUN: 34 mg/dL — ABNORMAL HIGH (ref 8–23)
CO2: 25 mmol/L (ref 22–32)
Calcium: 8.9 mg/dL (ref 8.9–10.3)
Chloride: 93 mmol/L — ABNORMAL LOW (ref 98–111)
Creatinine, Ser: 8.28 mg/dL — ABNORMAL HIGH (ref 0.44–1.00)
GFR, Estimated: 5 mL/min — ABNORMAL LOW (ref >60)
Glucose, Bld: 150 mg/dL — ABNORMAL HIGH (ref 70–99)
Potassium: 4.4 mmol/L (ref 3.5–5.1)
Sodium: 133 mmol/L — ABNORMAL LOW (ref 135–145)
Total Bilirubin: 0.6 mg/dL (ref 0.3–1.2)
Total Protein: 6.7 g/dL (ref 6.5–8.1)

## 2021-02-10 LAB — CBC
HCT: 31.8 % — ABNORMAL LOW (ref 36.0–46.0)
Hemoglobin: 10.2 g/dL — ABNORMAL LOW (ref 12.0–15.0)
MCH: 28.3 pg (ref 26.0–34.0)
MCHC: 32.1 g/dL (ref 30.0–36.0)
MCV: 88.3 fL (ref 80.0–100.0)
Platelets: 244 10*3/uL (ref 150–400)
RBC: 3.6 MIL/uL — ABNORMAL LOW (ref 3.87–5.11)
RDW: 18.2 % — ABNORMAL HIGH (ref 11.5–15.5)
WBC: 7.3 10*3/uL (ref 4.0–10.5)
nRBC: 0 % (ref 0.0–0.2)

## 2021-02-10 LAB — CK: Total CK: 31 U/L — ABNORMAL LOW (ref 38–234)

## 2021-02-10 LAB — URIC ACID: Uric Acid, Serum: 2.7 mg/dL (ref 2.5–7.1)

## 2021-02-10 LAB — SEDIMENTATION RATE: Sed Rate: 17 mm/hr (ref 0–22)

## 2021-02-10 NOTE — Evaluation (Signed)
Occupational Therapy Evaluation and Discharge Patient Details Name: Ashley [name] MRN: [id number] DOB: [birth year] Today's Date: 02/10/2021    History of Present Illness 70 year old female with past medical history of end-stage renal disease (MWF HD), hypertension, chronic pancreatitis, hyperlipidemia, previous stroke (2019), nicotine dependence presenting with multiple complaints including of right flank pain nausea vomiting and melena,rectal examination was done by ED provider which showed black stool which was heme positive. EGD on 9/1--Three small angioectasias with bleeding, were cauterized with one being clipped. +COVID   Clinical Impression   This 70 yo female admitted with above presents to acute OT at an independent level for basic ADLs, no further OT needs, we will D/C from acute OT.    Follow Up Recommendations  No OT follow up;Supervision - Intermittent    Equipment Recommendations  None recommended by OT       Precautions / Restrictions Precautions Precautions: None Restrictions Weight Bearing Restrictions: No      Mobility Bed Mobility Overal bed mobility: Independent                  Transfers Overall transfer level: Independent                    Balance Overall balance assessment: Independent                                         ADL either performed or assessed with clinical judgement   ADL Overall ADL's : Independent                                             Vision Baseline Vision/History: 1 Wears glasses Ability to See in Adequate Light: 0 Adequate              Pertinent Vitals/Pain Pain Assessment: No/denies pain     Hand Dominance Right   Extremity/Trunk Assessment Upper Extremity Assessment Upper Extremity Assessment: Overall WFL for tasks assessed   Lower Extremity Assessment Lower Extremity Assessment: Overall WFL for tasks assessed   Cervical / Trunk Assessment Cervical /  Trunk Assessment: Normal   Communication Communication Communication: No difficulties   Cognition Arousal/Alertness: Awake/alert Behavior During Therapy: WFL for tasks assessed/performed Overall Cognitive Status: Within Functional Limits for tasks assessed                                     General Comments  pt on RA            Home Living Family/patient expects to be discharged to:: Private residence Living Arrangements: Alone Available Help at Discharge: Family;Available PRN/intermittently Type of Home: Apartment Home Access: Level entry (elevator to 3 rd floor)     Home Layout: One level     Bathroom Shower/Tub: Teacher, early years/pre: Standard     Home Equipment: Tub bench;Walker - 2 wheels          Prior Functioning/Environment Level of Independence: Independent        Comments: states difficulty getting off her toilet                 OT Goals(Current goals can be found in the care plan section) Acute  Rehab OT Goals Patient Stated Goal: return home                   Co-evaluation PT/OT/SLP Co-Evaluation/Treatment: Yes Reason for Co-Treatment: For patient/therapist safety;To address functional/ADL transfers   OT goals addressed during session: ADL's and self-care      AM-PAC OT "6 Clicks" Daily Activity     Outcome Measure Help from another person eating meals?: None Help from another person taking care of personal grooming?: None Help from another person toileting, which includes using toliet, bedpan, or urinal?: None Help from another person bathing (including washing, rinsing, drying)?: None Help from another person to put on and taking off regular upper body clothing?: None Help from another person to put on and taking off regular lower body clothing?: None 6 Click Score: 24   End of Session Nurse Communication: Mobility status (no further OT/PT needs)  Activity Tolerance: Patient tolerated treatment  well Patient left: in bed;with call bell/phone within reach;with bed alarm set                   Time: 1135-1149 OT Time Calculation (min): 14 min Charges:  OT General Charges $OT Visit: 1 Visit OT Evaluation $OT Eval Low Complexity: 1 Low  Ashley [name], OTR/L Acute [employer] Pager [phone] Office [phone]    Ashley [name] 02/10/2021, 12:51 PM

## 2021-02-10 NOTE — Progress Notes (Signed)
[facility] PROGRESS NOTE   Ashley [name] [id number] DOB: July 05, 1950 DOA: 02/05/2021  PCP: Raymon Mutton., FNP  Brief History/Interval Summary: 70 year old female with past medical history of end-stage renal disease (MWF HD), hypertension, chronic pancreatitis, hyperlipidemia, previous stroke (2019), nicotine dependence presenting with multiple complaints including of right flank pain nausea vomiting and melena.  Symptoms were associated with nonbilious emesis.  Initially presented on 8/28 to the emergency department.  She underwent CT scan which did not show any acute findings.  And then on 8/29 pain was severe enough that she missed hemodialysis.  And then presented back to the ED.  CT scan was repeated which again did not show any acute findings.  Rectal examination was done by ED provider which showed black stool which was heme positive.  She was hospitalized for further management.  Consultants: Nephrology.  Gastroenterology  Procedures:  EGD 9/1 Impression:               - Three small angioectasias with bleeding on                            contact were found in the duodenal bulb and in the                            second portion of the duodenum. I treated each with                            APC cautery and they all bled during treatment. One                            (in the second portion of the duodenum) bled                            briskly enough after APC application that I decided                            to place a single endoclip at the site with                            immediate hemostasis.                           - The examination was otherwise normal. Recommendation:           - Will allow solid food now.                           - She should continue once daily PPI (her usual                            outpatient dose).                           - If she is felt to be bleeding further please call                            or page.  If that  is the case will consider small                            bowel capsule vs. direct to enteroscopy.   Antibiotics: Anti-infectives (From admission, onward)    Start     Dose/Rate Route Frequency Ordered Stop   02/08/21 1000  remdesivir 100 mg in sodium chloride 0.9 % 100 mL IVPB        100 mg 200 mL/hr over 30 Minutes Intravenous Daily 02/07/21 1037 02/12/21 0959   02/07/21 1130  remdesivir 200 mg in sodium chloride 0.9% 250 mL IVPB        200 mg 580 mL/hr over 30 Minutes Intravenous Once 02/07/21 1037 02/07/21 2310       Subjective/Interval History: Patient mentions that she is feeling better this morning.  Not having as much generalized body ache as she was yesterday.  Denies any nausea or vomiting.  Overall she feels better today.  Continues to have some cough which is mostly dry.    Assessment/Plan:  Melanotic stool/concern for upper GI bleed Patient with several day history of melanotic stool.  She was noted to have black stool on rectal exam which was heme positive.   Gastroenterology was consulted.  Patient placed on PPI.   EGD reviewed AVMs which is the likely source of her GI bleed.  These were addressed during the EGD with cautery and clip.  Hemoglobin noted to be stable.  PPI being continued.    Pneumonia due to COVID-19 Noted to be positive for COVID-19.  Patient had been experiencing a dry cough for 2 to 3 days prior to admission.  She had exposure to somebody with COVID a week prior to her admission. Cycle threshold was not available.  Cannot give Paxlovid due to end-stage renal disease.   Patient was started on Remdesivir.  Respiratory status noted to be stable.  Plan was to initially just give her 3-day course however due to persistent symptoms it might be worthwhile completing the 5-day course which will be tomorrow.  CRP was noted to be less than 0.5.    Generalized body ache with inability to move Patient with severe generalized body aches over the last 2 days.   No specific site of discomfort was present.  Symptoms thought to be most likely secondary to COVID.  Her CK level was normal.  Uric acid level normal.  CRP was less than 0.5.  However due to significant symptoms she was started on steroids yesterday.  She appears to have improved today.  Feels better.  Able to move her arms and legs better than yesterday.  Continue with steroids for now.  We will plan a quick taper as long as her symptoms continue to improve.  PT and OT evaluation is still pending.  Right-sided flank pain/right upper quadrant pain Etiology is unclear.  Mild elevation in lipase is likely due to history of chronic pancreatitis.  LFTs otherwise normal.  CT scan did not reveal any abnormal findings in the gallbladder.  Right upper quadrant ultrasound does not show any gallstones or any other abnormality.  Pain either is referred from stomach or could be musculoskeletal.  Not an active issue currently.  End-stage renal disease/hyperkalemia She undergoes dialysis on a Monday Wednesday Friday schedule.  Nephrology is following.  Dialyzed off schedule here.  Unclear as to her next dialysis session.  Hypertensive urgency Continue antihypertensives including amlodipine, clonidine, hydralazine and metoprolol.  Occasional high readings noted.  Will not be too aggressive as she is on hemodialysis.  Hyperlipidemia Continue statin.  Chronic pancreatitis Apparently confirmed by EUS in 2021.  Anemia of chronic disease Actually close to her baseline.  Hemoglobin noted to be stable.  Transfuse if less than 7.  DVT Prophylaxis: SCDs Code Status: Full code Family Communication: Discussed with patient Disposition Plan: To be determined.  PT and OT evaluation.  Status is: Inpatient  Remains inpatient appropriate because:Ongoing diagnostic testing needed not appropriate for outpatient work up, IV treatments appropriate due to intensity of illness or inability to take PO, and Inpatient level of  care appropriate due to severity of illness  Dispo:  Patient From: Home  Planned Disposition: Home  Medically stable for discharge: No             Medications: Scheduled:  amLODipine  10 mg Oral Q1200   atorvastatin  20 mg Oral QHS   cinacalcet  30 mg Oral QHS   cloNIDine  0.1 mg Oral BID   doxercalciferol  3 mcg Oral Q M,W,F-HD   feeding supplement  1 Container Oral TID BM   hydrALAZINE  75 mg Oral Q8H   lidocaine  1 patch Transdermal Q24H   metoprolol succinate  12.5 mg Oral Daily   mirtazapine  15 mg Oral QHS   pantoprazole  40 mg Oral Q0600   predniSONE  30 mg Oral BID WC   sevelamer carbonate  1,600 mg Oral TID WC   Continuous:  iron sucrose Stopped (02/06/21 1214)   remdesivir 100 mg in NS 100 mL 100 mg (02/10/21 0941)   WGN:FAOZHYQMVHQIO **OR** acetaminophen, labetalol, ondansetron **OR** ondansetron (ZOFRAN) IV, oxyCODONE, polyethylene glycol   Objective:  Vital Signs  Vitals:   02/10/21 0445 02/10/21 0500 02/10/21 0700 02/10/21 0722  BP:   (!) 166/87 (!) 172/92  Pulse: (!) 104 100  98  Resp: 20 15  16   Temp:    98.6 F (37 C)  TempSrc:    Oral  SpO2: 95% 94%  95%  Weight:      Height:        Intake/Output Summary (Last 24 hours) at 02/10/2021 1040 Last data filed at 02/10/2021 0445 Gross per 24 hour  Intake 800 ml  Output 0 ml  Net 800 ml    Filed Weights   02/07/21 1420 02/07/21 1800 02/09/21 0628  Weight: 55.3 kg 52.5 kg 53.8 kg    General appearance: Awake alert.  In no distress Resp: Coarse breath sounds bilaterally.  Few crackles at the bases.  No wheezing or rhonchi. Cardio: S1-S2 is normal regular.  No S3-S4.  No rubs murmurs or bruit GI: Abdomen is soft.  Nontender nondistended.  Bowel sounds are present normal.  No masses organomegaly Extremities: No edema.  Able to better move her lower extremities as well as upper extremities. Neurologic: Alert and oriented x3.  No focal neurological deficits.      Lab Results:  Data  Reviewed: I have personally reviewed following labs and imaging studies  CBC: Recent Labs  Lab 02/03/21 1632 02/05/21 2355 02/06/21 0929 02/06/21 1924 02/07/21 1042 02/08/21 0104 02/09/21 0049 02/10/21 0540  WBC 6.0 6.6   < > 6.1 6.9 5.0 8.2 7.3  NEUTROABS 3.5 3.7  --   --   --   --   --   --   HGB 12.2 9.8*   < > 8.6* 8.6* 9.7* 10.1* 10.2*  HCT 37.4 31.2*   < >  26.8* 26.9* 29.8* 32.5* 31.8*  MCV 89.0 90.4   < > 88.7 90.3 89.5 89.3 88.3  PLT 323 274   < > 242 245 210 243 244   < > = values in this interval not displayed.     Basic Metabolic Panel: Recent Labs  Lab 02/06/21 0929 02/07/21 0432 02/08/21 0104 02/09/21 0049 02/10/21 0540  NA 137 135 135 135 133*  K 5.3* 5.5* 5.0 5.5* 4.4  CL 97* 95* 96* 96* 93*  CO2 24 20* 22 25 25   GLUCOSE 87 78 77 90 150*  BUN 102* 106* 33* 51* 34*  CREATININE 18.72* 20.08* 9.62* 12.34* 8.28*  CALCIUM 9.1 8.9 8.3* 8.6* 8.9  MG 2.4  --   --   --   --   PHOS 5.6*  --   --   --   --      GFR: Estimated Creatinine Clearance: 4.9 mL/min (A) (by C-G formula based on SCr of 8.28 mg/dL (H)).  Liver Function Tests: Recent Labs  Lab 02/06/21 0929 02/07/21 0432 02/08/21 0104 02/09/21 0049 02/10/21 0540  AST 19 13* 23 31 14*  ALT 5 7 6 11 9   ALKPHOS 36* 43 40 38 46  BILITOT 0.9 1.3* 1.4* 0.4 0.6  PROT 6.0* 6.4* 7.0 6.8 6.7  ALBUMIN 3.0* 3.3* 3.5 3.2* 3.2*     Recent Labs  Lab 02/03/21 1632 02/05/21 2047  LIPASE 81* 59*      Cardiac Enzymes: Recent Labs  Lab 02/06/21 0929 02/10/21 0540  CKTOTAL 42 31*       Recent Results (from the past 240 hour(s))  SARS CORONAVIRUS 2 (TAT 6-24 HRS) Nasopharyngeal Nasopharyngeal Swab     Status: Abnormal   Collection Time: 02/06/21 12:05 AM   Specimen: Nasopharyngeal Swab  Result Value Ref Range Status   SARS Coronavirus 2 POSITIVE (A) NEGATIVE Final    Comment: (NOTE) SARS-CoV-2 target nucleic acids are DETECTED.  The SARS-CoV-2 RNA is generally detectable in upper and  lower respiratory specimens during the acute phase of infection. Positive results are indicative of the presence of SARS-CoV-2 RNA. Clinical correlation with patient history and other diagnostic information is  necessary to determine patient infection status. Positive results do not rule out bacterial infection or co-infection with other viruses.  The expected result is Negative.  Fact Sheet for Patients: HairSlick.no  Fact Sheet for Healthcare Providers: quierodirigir.com  This test is not yet approved or cleared by the [place] FDA and  has been authorized for detection and/or diagnosis of SARS-CoV-2 by FDA under an Emergency Use Authorization (EUA). This EUA will remain  in effect (meaning this test can be used) for the duration of the COVID-19 declaration under Section 564(b)(1) of the Act, 21 U. S.C. section 360bbb-3(b)(1), unless the authorization is terminated or revoked sooner.   Performed at [facility], [address], [place], [place] [address]        Radiology Studies: No results found.     LOS: 4 days   Kipton Skillen [employer] on [email or web address]  02/10/2021, 10:40 AM

## 2021-02-10 NOTE — Evaluation (Signed)
Physical Therapy Evaluation and Discharge Patient Details Name: Ashley [name] MRN: [id number] DOB: August 26, 1950 Today's Date: 02/10/2021   History of Present Illness  70 year old female with past medical history of end-stage renal disease (MWF HD), hypertension, chronic pancreatitis, hyperlipidemia, previous stroke (2019), nicotine dependence presenting with multiple complaints including of right flank pain nausea vomiting and melena,rectal examination was done by ED provider which showed black stool which was heme positive. EGD on 9/1--Three small angioectasias with bleeding, were cauterized with one being clipped. +COVID  Clinical Impression   Patient evaluated by Physical Therapy with no further acute PT needs identified. All education has been completed and the patient has no further questions. PT is signing off. Thank you for this referral.     Follow Up Recommendations No PT follow up    Equipment Recommendations  None recommended by PT    Recommendations for Other Services       Precautions / Restrictions Precautions Precautions: None      Mobility  Bed Mobility Overal bed mobility: Independent                  Transfers Overall transfer level: Independent                  Ambulation/Gait Ambulation/Gait assistance: Independent Gait Distance (Feet): 75 Feet Assistive device: None Gait Pattern/deviations: WFL(Within Functional Limits)   Gait velocity interpretation: 1.31 - 2.62 ft/sec, indicative of limited community ambulator General Gait Details: forwards, backwards, sideways--no imbalance  Stairs            Wheelchair Mobility    Modified Rankin (Stroke Patients Only)       Balance Overall balance assessment: Independent                                           Pertinent Vitals/Pain Pain Assessment: No/denies pain    Home Living Family/patient expects to be discharged to:: Private residence Living Arrangements:  Alone Available Help at Discharge: Family;Available PRN/intermittently Type of Home: Apartment Home Access: Level entry (elevator to 3 rd floor)     Home Layout: One level Home Equipment: Tub bench;Walker - 2 wheels      Prior Function Level of Independence: Independent         Comments: states difficulty getting off her toilet     Hand Dominance   Dominant Hand: Right    Extremity/Trunk Assessment   Upper Extremity Assessment Upper Extremity Assessment: Defer to OT evaluation    Lower Extremity Assessment Lower Extremity Assessment: Overall WFL for tasks assessed    Cervical / Trunk Assessment Cervical / Trunk Assessment: Normal  Communication   Communication: No difficulties  Cognition Arousal/Alertness: Awake/alert Behavior During Therapy: WFL for tasks assessed/performed Overall Cognitive Status: Within Functional Limits for tasks assessed                                        General Comments General comments (skin integrity, edema, etc.): pt on RA    Exercises     Assessment/Plan    PT Assessment Patent does not need any further PT services  PT Problem List         PT Treatment Interventions      PT Goals (Current goals can be found in the Care Plan section)  Acute Rehab PT Goals Patient Stated Goal: return home PT Goal Formulation: All assessment and education complete, DC therapy    Frequency     Barriers to discharge        Co-evaluation               AM-PAC PT "6 Clicks" Mobility  Outcome Measure Help needed turning from your back to your side while in a flat bed without using bedrails?: None Help needed moving from lying on your back to sitting on the side of a flat bed without using bedrails?: None Help needed moving to and from a bed to a chair (including a wheelchair)?: None Help needed standing up from a chair using your arms (e.g., wheelchair or bedside chair)?: None Help needed to walk in hospital  room?: None Help needed climbing 3-5 steps with a railing? : None 6 Click Score: 24    End of Session   Activity Tolerance: Patient tolerated treatment well Patient left: in bed;with call bell/phone within reach Nurse Communication: Mobility status;Other (comment) (dc from therapy) PT Visit Diagnosis: Difficulty in walking, not elsewhere classified (R26.2)    Time: [id number] PT Time Calculation (min) (ACUTE ONLY): 14 min   Charges:   PT Evaluation $PT Eval Low Complexity: 1 Low           Arby Barrette, PT Pager [phone]   Rexanne Mano 02/10/2021, 12:15 PM

## 2021-02-10 NOTE — Progress Notes (Signed)
[facility] Progress Note   Subjective:  Seen in room - moving to recliner. Looks MUCH better today, body aches resolved with steroids. No CP, still with cough.  Objective Vitals:   02/10/21 0445 02/10/21 0500 02/10/21 0700 02/10/21 0722  BP:   (!) 166/87 (!) 172/92  Pulse: (!) 104 100  98  Resp: '20 15  16  '$ Temp:    98.6 F (37 C)  TempSrc:    Oral  SpO2: 95% 94%  95%  Weight:      Height:       Physical Exam General: Frail woman, NAD. Nasal O2 in place Heart: RRR; no murmur Lungs: Rhonchi throughout Abdomen: soft Extremities: No LE edema Dialysis Access: L AVF + bruit  Additional Objective Labs: Basic Metabolic Panel: Recent Labs  Lab 02/06/21 0929 02/07/21 0432 02/08/21 0104 02/09/21 0049 02/10/21 0540  NA 137   < > 135 135 133*  K 5.3*   < > 5.0 5.5* 4.4  CL 97*   < > 96* 96* 93*  CO2 24   < > '22 25 25  '$ GLUCOSE 87   < > 77 90 150*  BUN 102*   < > 33* 51* 34*  CREATININE 18.72*   < > 9.62* 12.34* 8.28*  CALCIUM 9.1   < > 8.3* 8.6* 8.9  PHOS 5.6*  --   --   --   --    < > = values in this interval not displayed.   Liver Function Tests: Recent Labs  Lab 02/08/21 0104 02/09/21 0049 02/10/21 0540  AST 23 31 14*  ALT '6 11 9  '$ ALKPHOS 40 38 46  BILITOT 1.4* 0.4 0.6  PROT 7.0 6.8 6.7  ALBUMIN 3.5 3.2* 3.2*   Recent Labs  Lab 02/03/21 1632 02/05/21 2047  LIPASE 81* 59*   CBC: Recent Labs  Lab 02/03/21 1632 02/05/21 2355 02/06/21 0929 02/06/21 1924 02/07/21 1042 02/08/21 0104 02/09/21 0049 02/10/21 0540  WBC 6.0 6.6   < > 6.1 6.9 5.0 8.2 7.3  NEUTROABS 3.5 3.7  --   --   --   --   --   --   HGB 12.2 9.8*   < > 8.6* 8.6* 9.7* 10.1* 10.2*  HCT 37.4 31.2*   < > 26.8* 26.9* 29.8* 32.5* 31.8*  MCV 89.0 90.4   < > 88.7 90.3 89.5 89.3 88.3  PLT 323 274   < > 242 245 210 243 244   < > = values in this interval not displayed.   Medications:  iron sucrose Stopped (02/06/21 1214)   remdesivir 100 mg in NS 100 mL 100 mg (02/10/21  0941)    amLODipine  10 mg Oral Q1200   atorvastatin  20 mg Oral QHS   cinacalcet  30 mg Oral QHS   cloNIDine  0.1 mg Oral BID   doxercalciferol  3 mcg Oral Q M,W,F-HD   feeding supplement  1 Container Oral TID BM   hydrALAZINE  75 mg Oral Q8H   lidocaine  1 patch Transdermal Q24H   metoprolol succinate  12.5 mg Oral Daily   mirtazapine  15 mg Oral QHS   pantoprazole  40 mg Oral Q0600   predniSONE  30 mg Oral BID WC   sevelamer carbonate  1,600 mg Oral TID WC    Dialysis Orders: MWF [facility] 3.5 hours 160 NRe 350/500 51 kg 2.0K/2.0 Ca UFP 4 AVF - Heparin 2000 units initial bolus Heparin 1000 units IV mid  run - Mircera 100 mcg IV q 2 weeks (last dose 01/28/2021) - Venofer 50 mg IV q week - Hectorol 3 mcg IV TIW   Assessment/Plan:  UGIB: S/p EGD 9/1, found 3 small, bleeding AVMs, which were treated with APC and endoclip.  R Flank Pain: CT scan without acute abnormalities, better today.  COVID 19 PNA: Started on Remdesivir. Per primary.  Severe generalized aches: Started prednisone yesterday - MUCH better today.  ESRD: Continue HD per usual MWF schedule -  next 9/5.  Hypertension/volume: BP slightly high, home medications have been resumed. UF as tolerated.   Anemia of ESRD: Hgb 10.2, better. ESA due on 9/5. Holding IV iron for now.  Metabolic bone disease: Labs decent. Continue binder, VDRA, sensipar.   Nutrition: Continue protein supplement. 9.    Hx chronic pancreatitis  Veneta Penton, PA-C 02/10/2021, 10:52 AM  [facility]

## 2021-02-11 ENCOUNTER — Encounter (HOSPITAL_COMMUNITY): Payer: Self-pay | Admitting: Gastroenterology

## 2021-02-11 LAB — COMPREHENSIVE METABOLIC PANEL
ALT: 7 U/L (ref 0–44)
AST: 14 U/L — ABNORMAL LOW (ref 15–41)
Albumin: 3.1 g/dL — ABNORMAL LOW (ref 3.5–5.0)
Alkaline Phosphatase: 43 U/L (ref 38–126)
Anion gap: 15 (ref 5–15)
BUN: 58 mg/dL — ABNORMAL HIGH (ref 8–23)
CO2: 24 mmol/L (ref 22–32)
Calcium: 8.7 mg/dL — ABNORMAL LOW (ref 8.9–10.3)
Chloride: 92 mmol/L — ABNORMAL LOW (ref 98–111)
Creatinine, Ser: 10.62 mg/dL — ABNORMAL HIGH (ref 0.44–1.00)
GFR, Estimated: 4 mL/min — ABNORMAL LOW (ref >60)
Glucose, Bld: 134 mg/dL — ABNORMAL HIGH (ref 70–99)
Potassium: 5.1 mmol/L (ref 3.5–5.1)
Sodium: 131 mmol/L — ABNORMAL LOW (ref 135–145)
Total Bilirubin: 0.4 mg/dL (ref 0.3–1.2)
Total Protein: 6.4 g/dL — ABNORMAL LOW (ref 6.5–8.1)

## 2021-02-11 LAB — CBC
HCT: 30.7 % — ABNORMAL LOW (ref 36.0–46.0)
Hemoglobin: 10.1 g/dL — ABNORMAL LOW (ref 12.0–15.0)
MCH: 28.9 pg (ref 26.0–34.0)
MCHC: 32.9 g/dL (ref 30.0–36.0)
MCV: 87.7 fL (ref 80.0–100.0)
Platelets: 201 10*3/uL (ref 150–400)
RBC: 3.5 MIL/uL — ABNORMAL LOW (ref 3.87–5.11)
RDW: 18.4 % — ABNORMAL HIGH (ref 11.5–15.5)
WBC: 9.4 10*3/uL (ref 4.0–10.5)
nRBC: 0 % (ref 0.0–0.2)

## 2021-02-11 MED ORDER — DOXYCYCLINE HYCLATE 50 MG PO CAPS: 50.0000 mg | ORAL_CAPSULE | Freq: Two times a day (BID) | ORAL | 0 refills | Status: AC

## 2021-02-11 MED ORDER — PREDNISONE 20 MG PO TABS: ORAL_TABLET | ORAL | 0 refills | Status: DC

## 2021-02-11 MED ORDER — OXYCODONE HCL 5 MG PO TABS: 5.0000 mg | ORAL_TABLET | Freq: Four times a day (QID) | ORAL | 0 refills | Status: AC | PRN

## 2021-02-11 MED ORDER — DOXYCYCLINE HYCLATE 100 MG PO TABS
100.0000 mg | ORAL_TABLET | Freq: Two times a day (BID) | ORAL | Status: DC
  Administered 2021-02-11 – 2021-02-12 (×3): 100 mg via ORAL
  Filled 2021-02-11 (×3): qty 1

## 2021-02-11 NOTE — Progress Notes (Signed)
[facility] Progress Note   Subjective:  Seen in room. No c/o.   Objective Vitals:   02/11/21 0630 02/11/21 0637 02/11/21 0747 02/11/21 1205  BP:  134/71    Pulse:      Resp: 19     Temp:   98 F (36.7 C) 98.2 F (36.8 C)  TempSrc:   Oral Oral  SpO2:      Weight:      Height:       Physical Exam General: Frail woman, NAD. Nasal O2 in place Heart: RRR; no murmur Lungs: Rhonchi throughout Abdomen: soft Extremities: No LE edema Dialysis Access: L AVF + bruit   Dialysis Orders: MWF [facility]   3.5h  350/500  51kg  2/2 bath  P4  AVF  Hep 2000+ [id number] - Mircera 100 mcg IV q 2 weeks (last dose 01/28/2021) - Venofer 50 mg IV q week - Hectorol 3 mcg IV TIW   Assessment/Plan:  UGIB: S/p EGD 9/1, found 3 small, bleeding AVMs, which were treated with APC and endoclip.  R Flank Pain: CT scan without acute abnormalities, better today.  COVID 19 PNA: Started on Remdesivir. Last dose today.   Severe generalized aches: Started prednisone. Better.   ESRD: Continue HD per usual MWF schedule - will not get HD til night shift due to staffing issues.  Had d/w pmd.  Hypertension/volume: BP slightly high, home medications have been resumed. UF as tolerated.   Anemia of ESRD: Hgb 10.2, better. ESA due on 9/5. Holding IV iron for now.  Metabolic bone disease: Labs decent. Continue binder, VDRA, sensipar.   Nutrition: Continue protein supplement. 9.    Hx chronic pancreatitis 10.   Dispo - for dc today after HD, or dc in the morning if gets HD late in the evening tonight.  RKelly Splinter MD 02/11/2021, 3:39 PM         Additional Objective Labs: Basic Metabolic Panel: Recent Labs  Lab 02/06/21 0929 02/07/21 0432 02/09/21 0049 02/10/21 0540 02/11/21 0052  NA 137   < > 135 133* 131*  K 5.3*   < > 5.5* 4.4 5.1  CL 97*   < > 96* 93* 92*  CO2 24   < > '25 25 24  '$ GLUCOSE 87   < > 90 150* 134*  BUN 102*   < > 51* 34* 58*  CREATININE 18.72*   < > 12.34* 8.28* 10.62*   CALCIUM 9.1   < > 8.6* 8.9 8.7*  PHOS 5.6*  --   --   --   --    < > = values in this interval not displayed.    Liver Function Tests: Recent Labs  Lab 02/09/21 0049 02/10/21 0540 02/11/21 0052  AST 31 14* 14*  ALT '11 9 7  '$ ALKPHOS 38 46 43  BILITOT 0.4 0.6 0.4  PROT 6.8 6.7 6.4*  ALBUMIN 3.2* 3.2* 3.1*    Recent Labs  Lab 02/05/21 2047  LIPASE 59*    CBC: Recent Labs  Lab 02/05/21 2355 02/06/21 0929 02/07/21 1042 02/08/21 0104 02/09/21 0049 02/10/21 0540 02/11/21 0052  WBC 6.6   < > 6.9 5.0 8.2 7.3 9.4  NEUTROABS 3.7  --   --   --   --   --   --   HGB 9.8*   < > 8.6* 9.7* 10.1* 10.2* 10.1*  HCT 31.2*   < > 26.9* 29.8* 32.5* 31.8* 30.7*  MCV 90.4   < > 90.3 89.5 89.3  88.3 87.7  PLT 274   < > 245 210 243 244 201   < > = values in this interval not displayed.    Medications:    amLODipine  10 mg Oral Q1200   atorvastatin  20 mg Oral QHS   cinacalcet  30 mg Oral QHS   cloNIDine  0.1 mg Oral BID   doxercalciferol  3 mcg Oral Q M,W,F-HD   doxycycline  100 mg Oral Q12H   feeding supplement  1 Container Oral TID BM   hydrALAZINE  75 mg Oral Q8H   lidocaine  1 patch Transdermal Q24H   metoprolol succinate  12.5 mg Oral Daily   mirtazapine  15 mg Oral QHS   pantoprazole  40 mg Oral Q0600   predniSONE  30 mg Oral BID WC   sevelamer carbonate  1,600 mg Oral TID WC

## 2021-02-11 NOTE — Discharge Summary (Signed)
[facility]  Physician Discharge Summary   Patient ID: Ashley [name] MRN: [id number] DOB/AGE: 08/01/1950 70 y.o.  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 Pneumonia due to COVID-19 End-stage renal disease on hemodialysis Generalized body aches secondary to COVID-19 Hyperlipidemia Chronic pancreatitis  RECOMMENDATIONS FOR OUTPATIENT FOLLOW UP: Resume usual outpatient hemodialysis regimen Can come off of isolation for COVID-19 on September 11   Home Health: None Equipment/Devices: None  CODE STATUS: Full code  DISCHARGE CONDITION: fair  Diet recommendation: As before  INITIAL HISTORY: 70 year old female with past medical history of end-stage renal disease (MWF HD), hypertension, chronic pancreatitis, hyperlipidemia, previous stroke (2019), nicotine dependence presenting with multiple complaints including of right flank pain nausea vomiting and melena.  Symptoms were associated with nonbilious emesis.  Initially presented on 8/28 to the emergency department.  She underwent CT scan which did not show any acute findings.  And then on 8/29 pain was severe enough that she missed hemodialysis.  And then presented back to the ED.  CT scan was repeated which again did not show any acute findings.  Rectal examination was done by ED provider which showed black stool which was heme positive.  She was hospitalized for further management.  Consultants: Nephrology.  Gastroenterology   Procedures:  EGD 9/1 Impression:               - Three small angioectasias with bleeding on                            contact were found in the duodenal bulb and in the                            second portion of the duodenum. I treated each with                            APC cautery and they all bled during treatment. One                            (in the second portion of the duodenum) bled                             briskly enough after APC application that I decided                            to place a single endoclip at the site with                            immediate hemostasis.                           - The examination was otherwise normal. Recommendation:           - Will allow solid food now.                           - She should continue once daily PPI (her usual  outpatient dose).                           - If she is felt to be bleeding further please call                            or page. If that is the case will consider small                            bowel capsule vs. direct to enteroscopy.    HOSPITAL COURSE:   Upper GI bleed/AVM Patient with several day history of melanotic stool.  She was noted to have black stool on rectal exam which was heme positive.   Gastroenterology was consulted.  Patient placed on PPI.   EGD reviewed AVMs which is the likely source of her GI bleed.  These were addressed during the EGD with cautery and clip.  Hemoglobin noted to be stable.  PPI being continued.     Pneumonia due to COVID-19 Noted to be positive for COVID-19.  Patient had been experiencing a dry cough for 2 to 3 days prior to admission.  She had exposure to somebody with COVID a week prior to her admission. Cycle threshold was not available.  Cannot give Paxlovid due to end-stage renal disease.   Patient was started on Remdesivir.  Respiratory status noted to be stable.  She was given a 5-day course of Remdesivir.  Over the last 24 hours she is producing yellowish sputum.  Oxygen saturations still remained normal.  Quite possible she may have developed mild secondary bacterial infection.  We will treat her with doxycycline for 5 days.     Generalized body ache with inability to move Patient with severe generalized body aches over the last 2 days.  No specific site of discomfort was present.  Symptoms thought to be most likely secondary to  COVID.  Her CK level was normal.  Uric acid level normal.  CRP was less than 0.5.  However due to significant symptoms she was started on steroids.  She has significantly improved.  Was able to work with PT and OT.  We will plan a quick steroid taper.   Right-sided flank pain/right upper quadrant pain Mild elevation in lipase is likely due to history of chronic pancreatitis.  LFTs otherwise normal.  CT scan did not reveal any abnormal findings in the gallbladder.  Right upper quadrant ultrasound does not show any gallstones or any other abnormality.  Pain either is referred from stomach or could be musculoskeletal.  Not an active issue currently.   End-stage renal disease/hyperkalemia She undergoes dialysis on a Monday Wednesday Friday schedule.  Seen by nephrology in the hospital.   Essential hypertension Continue antihypertensives including amlodipine, clonidine, hydralazine and metoprolol.    Hyperlipidemia Continue statin.   Chronic pancreatitis Apparently confirmed by EUS in 2021.  Anemia of chronic disease Actually close to her baseline.  Hemoglobin noted to be stable.    Patient has improved.  Okay for discharge home after dialysis today.    PERTINENT LABS:  The results of significant diagnostics from this hospitalization (including imaging, microbiology, ancillary and laboratory) are listed below for reference.    Microbiology: Recent Results (from the past 240 hour(s))  SARS CORONAVIRUS 2 (TAT 6-24 HRS) Nasopharyngeal Nasopharyngeal Swab     Status: Abnormal   Collection Time: 02/06/21  12:05 AM   Specimen: Nasopharyngeal Swab  Result Value Ref Range Status   SARS Coronavirus 2 POSITIVE (A) NEGATIVE Final    Comment: (NOTE) SARS-CoV-2 target nucleic acids are DETECTED.  The SARS-CoV-2 RNA is generally detectable in upper and lower respiratory specimens during the acute phase of infection. Positive results are indicative of the presence of SARS-CoV-2 RNA.  Clinical correlation with patient history and other diagnostic information is  necessary to determine patient infection status. Positive results do not rule out bacterial infection or co-infection with other viruses.  The expected result is Negative.  Fact Sheet for Patients: HairSlick.no  Fact Sheet for Healthcare Providers: quierodirigir.com  This test is not yet approved or cleared by the [place] FDA and  has been authorized for detection and/or diagnosis of SARS-CoV-2 by FDA under an Emergency Use Authorization (EUA). This EUA will remain  in effect (meaning this test can be used) for the duration of the COVID-19 declaration under Section 564(b)(1) of the Act, 21 U. S.C. section 360bbb-3(b)(1), unless the authorization is terminated or revoked sooner.   Performed at [facility], [address], [place], [place] [address]      Labs:  COVID-19 Labs  Recent Labs    02/09/21 0049  CRP <0.5    Lab Results  Component Value Date   SARSCOV2NAA POSITIVE (A) 02/06/2021   SARSCOV2NAA NEGATIVE 11/09/2020   SARSCOV2NAA NEGATIVE 02/01/2020   SARSCOV2NAA NEGATIVE 01/07/2020      Basic Metabolic Panel: Recent Labs  Lab 02/06/21 0929 02/07/21 0432 02/08/21 0104 02/09/21 0049 02/10/21 0540 02/11/21 0052  NA 137 135 135 135 133* 131*  K 5.3* 5.5* 5.0 5.5* 4.4 5.1  CL 97* 95* 96* 96* 93* 92*  CO2 24 20* 22 25 25 24   GLUCOSE 87 78 77 90 150* 134*  BUN 102* 106* 33* 51* 34* 58*  CREATININE 18.72* 20.08* 9.62* 12.34* 8.28* 10.62*  CALCIUM 9.1 8.9 8.3* 8.6* 8.9 8.7*  MG 2.4  --   --   --   --   --   PHOS 5.6*  --   --   --   --   --    Liver Function Tests: Recent Labs  Lab 02/07/21 0432 02/08/21 0104 02/09/21 0049 02/10/21 0540 02/11/21 0052  AST 13* 23 31 14* 14*  ALT 7 6 11 9 7   ALKPHOS 43 40 38 46 43  BILITOT 1.3* 1.4* 0.4 0.6 0.4  PROT 6.4* 7.0 6.8 6.7 6.4*  ALBUMIN 3.3* 3.5 3.2* 3.2*  3.1*   Recent Labs  Lab 02/05/21 2047  LIPASE 59*    CBC: Recent Labs  Lab 02/05/21 2355 02/06/21 0929 02/07/21 1042 02/08/21 0104 02/09/21 0049 02/10/21 0540 02/11/21 0052  WBC 6.6   < > 6.9 5.0 8.2 7.3 9.4  NEUTROABS 3.7  --   --   --   --   --   --   HGB 9.8*   < > 8.6* 9.7* 10.1* 10.2* 10.1*  HCT 31.2*   < > 26.9* 29.8* 32.5* 31.8* 30.7*  MCV 90.4   < > 90.3 89.5 89.3 88.3 87.7  PLT 274   < > 245 210 243 244 201   < > = values in this interval not displayed.   Cardiac Enzymes: Recent Labs  Lab 02/06/21 0929 02/10/21 0540  CKTOTAL 42 31*      IMAGING STUDIES CT ABDOMEN PELVIS W CONTRAST  Result Date: 02/06/2021 CLINICAL DATA:  Right lower quadrant pain 1  dark stool EXAM: CT ABDOMEN AND PELVIS WITH CONTRAST TECHNIQUE: Multidetector CT imaging of the abdomen and pelvis was performed using the standard protocol following bolus administration of intravenous contrast. CONTRAST:  55mL OMNIPAQUE IOHEXOL 350 MG/ML SOLN COMPARISON:  02/03/2021 FINDINGS: Lower chest: Lung bases are clear. Hepatobiliary: Liver is within normal limits. Vicarious excretion of contrast within the gallbladder. No intrahepatic or extrahepatic ductal dilatation. Pancreas: Within normal limits. Spleen: Within normal limits. Adrenals/Urinary Tract: Adrenal glands are within normal limits. Bilateral renal cortical atrophy with small renal cysts measuring up to 9 mm in the right lower kidney (series 4/image 27). No hydronephrosis. Excretory contrast in the bladder. Stomach/Bowel: Stomach is within normal limits. No evidence of bowel obstruction. Normal appendix (series 4/image 39). No colonic wall thickening or inflammatory changes. Vascular/Lymphatic: No evidence of abdominal aortic aneurysm. Atherosclerotic calcifications of the abdominal aorta and branch vessels. No suspicious abdominopelvic lymphadenopathy. Reproductive: Status post hysterectomy. Left ovary is within normal limits.  No right adnexal mass.  Other: No abdominopelvic ascites. Musculoskeletal: Mild degenerative changes of the lower lumbar spine. IMPRESSION: No evidence of bowel obstruction. Normal appendix. No colonic wall thickening or inflammatory changes. No interval change from recent CT. Electronically Signed   By: Charline Bills M.D.   On: 02/06/2021 01:02   CT ABDOMEN PELVIS W CONTRAST  Result Date: 02/03/2021 CLINICAL DATA:  Abdominal pain. EXAM: CT ABDOMEN AND PELVIS WITH CONTRAST TECHNIQUE: Multidetector CT imaging of the abdomen and pelvis was performed using the standard protocol following bolus administration of intravenous contrast. CONTRAST:  50mL OMNIPAQUE IOHEXOL 350 MG/ML SOLN COMPARISON:  CT abdomen pelvis dated 01/07/2020. FINDINGS: Lower chest: Bibasilar linear atelectasis/scarring. There is coronary vascular calcification. No intra-abdominal free air or free fluid. Hepatobiliary: No focal liver abnormality is seen. No gallstones, gallbladder wall thickening, or biliary dilatation. Pancreas: Unremarkable. No pancreatic ductal dilatation or surrounding inflammatory changes. Spleen: Normal in size without focal abnormality. Adrenals/Urinary Tract: Mild adrenal thickening. There is moderate bilateral renal parenchyma atrophy. Several subcentimeter bilateral renal hypodense lesions are too small to characterize. There is no hydronephrosis on either side. The visualized ureters appear unremarkable. The urinary bladder is collapsed. Stomach/Bowel: There is no bowel obstruction or active inflammation. The appendix is normal. Vascular/Lymphatic: Advanced aortoiliac atherosclerotic disease. The IVC is unremarkable. No venous gas. There is no adenopathy. Reproductive: Hysterectomy.  No adnexal masses. Other: None Musculoskeletal: Degenerative changes of the spine. No acute osseous pathology. IMPRESSION: 1. No acute intra-abdominal or pelvic pathology. No bowel obstruction. Normal appendix. 2. Moderate bilateral renal parenchyma atrophy.  3. Aortic Atherosclerosis (ICD10-I70.0). Electronically Signed   By: Elgie Collard M.D.   On: 02/03/2021 23:30   DG CHEST PORT 1 VIEW  Result Date: 02/07/2021 CLINICAL DATA:  Dyspnea, COVID positive EXAM: PORTABLE CHEST 1 VIEW COMPARISON:  Chest radiograph 11/10/2020 FINDINGS: The heart is enlarged, unchanged. The mediastinal contours are stable. There are patchy opacities in the lateral left midlung and lateral base. The right lung is clear. There is no pleural effusion or pneumothorax. There is no acute osseous abnormality. IMPRESSION: 1. Patchy opacities in the left lung as above may reflect sequela of COVID. 2. Unchanged cardiomegaly Electronically Signed   By: Lesia Hausen M.D.   On: 02/07/2021 08:35   US Abdomen Limited RUQ (LIVER/GB)  Result Date: 02/06/2021 CLINICAL DATA:  Right upper quadrant pain EXAM: ULTRASOUND ABDOMEN LIMITED RIGHT UPPER QUADRANT COMPARISON:  CT 02/06/2021 FINDINGS: Gallbladder: No gallstones or wall thickening visualized. No sonographic Murphy sign noted by sonographer. Common bile duct: Diameter:  1.6 mm Liver: No focal lesion identified. Within normal limits in parenchymal echogenicity. Portal vein is patent on color Doppler imaging with normal direction of blood flow towards the liver. Other: Right kidney is echogenic IMPRESSION: 1. Negative for gallstones. 2. Echogenic right kidney consistent with medical renal disease. Electronically Signed   By: Jasmine Pang M.D.   On: 02/06/2021 16:05    DISCHARGE EXAMINATION: Vitals:   02/11/21 0630 02/11/21 0637 02/11/21 0747 02/11/21 1205  BP:  134/71    Pulse:      Resp: 19     Temp:   98 F (36.7 C) 98.2 F (36.8 C)  TempSrc:   Oral Oral  SpO2:      Weight:      Height:       General appearance: Awake alert.  In no distress Resp: Normal effort at rest.  Few crackles at the bases bilaterally.  No wheezing or rhonchi. Cardio: S1-S2 is normal regular.  No S3-S4.  No rubs murmurs or bruit GI: Abdomen is soft.   Nontender nondistended.  Bowel sounds are present normal.  No masses organomegaly    DISPOSITION: Home  Discharge Instructions     Call MD for:  difficulty breathing, headache or visual disturbances   Complete by: As directed    Call MD for:  extreme fatigue   Complete by: As directed    Call MD for:  hives   Complete by: As directed    Call MD for:  persistant dizziness or light-headedness   Complete by: As directed    Call MD for:  persistant nausea and vomiting   Complete by: As directed    Call MD for:  severe uncontrolled pain   Complete by: As directed    Call MD for:  temperature >100.4   Complete by: As directed    Diet - low sodium heart healthy   Complete by: As directed    Discharge instructions   Complete by: As directed    Please take your medications as prescribed.  Please be sure to follow-up with your primary care provider in 1 to 2 weeks.  Maintain your dialysis schedule.  You can come off of COVID-19 isolation on September 11.  You were cared for by a hospitalist during your hospital stay. If you have any questions about your discharge medications or the care you received while you were in the hospital after you are discharged, you can call the unit and asked to speak with the hospitalist on call if the hospitalist that took care of you is not available. Once you are discharged, your primary care physician will handle any further medical issues. Please note that NO REFILLS for any discharge medications will be authorized once you are discharged, as it is imperative that you return to your primary care physician (or establish a relationship with a primary care physician if you do not have one) for your aftercare needs so that they can reassess your need for medications and monitor your lab values. If you do not have a primary care physician, you can call [phone] for a physician referral.   Increase activity slowly   Complete by: As directed         Allergies as  of 02/11/2021       Reactions   Aspirin Nausea And Vomiting   Stomach ache   Ibuprofen Nausea And Vomiting   Stomach ache        Medication List     STOP taking  these medications    ferric citrate 1 GM 210 MG(Fe) tablet Commonly known as: AURYXIA       TAKE these medications    acetaminophen 325 MG tablet Commonly known as: TYLENOL Take 2 tablets (650 mg total) by mouth every 6 (six) hours as needed for mild pain (or Fever >/= 101).   amLODipine 10 MG tablet Commonly known as: NORVASC Take 1 tablet (10 mg total) by mouth daily. What changed: when to take this   atorvastatin 20 MG tablet Commonly known as: LIPITOR Take 20 mg by mouth at bedtime.   cinacalcet 30 MG tablet Commonly known as: SENSIPAR Take 30 mg by mouth at bedtime.   cloNIDine 0.1 MG tablet Commonly known as: CATAPRES Take 0.1 mg by mouth 3 (three) times daily.   DIALYVITE 800 WITH ZINC 0.8 MG Tabs Take 1 tablet by mouth daily.   doxycycline 50 MG capsule Commonly known as: VIBRAMYCIN Take 1 capsule (50 mg total) by mouth 2 (two) times daily for 5 days.   feeding supplement (NEPRO CARB STEADY) Liqd Take 237 mLs by mouth 3 (three) times daily as needed (Supplement).   hydrALAZINE 25 MG tablet Commonly known as: APRESOLINE Take 3 tablets (75 mg total) by mouth every 8 (eight) hours.   lidocaine-prilocaine cream Commonly known as: EMLA Apply 1 application topically See admin instructions. Apply small amount to access site on Tuesday, Thursday, Saturday one hour before dialysis. Cover with occlusive dressing (saran wrap)   methocarbamol 500 MG tablet Commonly known as: ROBAXIN Take 1 tablet (500 mg total) by mouth 2 (two) times daily.   metoprolol succinate 25 MG 24 hr tablet Commonly known as: TOPROL-XL Take 0.5 tablets (12.5 mg total) by mouth daily.   MIRCERA IJ Administered at dialysis   mirtazapine 15 MG tablet Commonly known as: REMERON Take 15 mg by mouth at bedtime.    oxyCODONE 5 MG immediate release tablet Commonly known as: Oxy IR/ROXICODONE Take 1 tablet (5 mg total) by mouth every 6 (six) hours as needed for up to 3 days for severe pain.   pantoprazole 40 MG tablet Commonly known as: PROTONIX Take 1 tablet (40 mg total) by mouth daily.   polyethylene glycol 17 g packet Commonly known as: MiraLax Take 17 g by mouth 2 (two) times daily. What changed:  when to take this reasons to take this   predniSONE 20 MG tablet Commonly known as: DELTASONE Take 3 tablets once daily for 3 days followed by 2 tablets once daily for 3 days followed by 1 tablet once daily for 3 days and then stop   sevelamer carbonate 800 MG tablet Commonly known as: RENVELA Take 800 mg by mouth 3 (three) times daily with meals.           TOTAL DISCHARGE TIME: 35 minutes  Kinnie Kaupp [employer] on [email or web address]  02/11/2021, 1:53 PM

## 2021-02-12 LAB — CBC
HCT: 30.5 % — ABNORMAL LOW (ref 36.0–46.0)
Hemoglobin: 10 g/dL — ABNORMAL LOW (ref 12.0–15.0)
MCH: 28.7 pg (ref 26.0–34.0)
MCHC: 32.8 g/dL (ref 30.0–36.0)
MCV: 87.6 fL (ref 80.0–100.0)
Platelets: 253 10*3/uL (ref 150–400)
RBC: 3.48 MIL/uL — ABNORMAL LOW (ref 3.87–5.11)
RDW: 18.5 % — ABNORMAL HIGH (ref 11.5–15.5)
WBC: 11.6 10*3/uL — ABNORMAL HIGH (ref 4.0–10.5)
nRBC: 0.2 % (ref 0.0–0.2)

## 2021-02-12 MED ORDER — DARBEPOETIN ALFA 60 MCG/0.3ML IJ SOSY
60.0000 ug | PREFILLED_SYRINGE | Freq: Once | INTRAMUSCULAR | Status: AC
  Administered 2021-02-12: 60 ug via INTRAVENOUS
  Filled 2021-02-12: qty 0.3

## 2021-02-12 NOTE — Plan of Care (Signed)
  Problem: Clinical Measurements: Goal: Ability to maintain clinical measurements within normal limits will improve Outcome: Progressing Goal: Will remain free from infection Outcome: Progressing Goal: Diagnostic test results will improve Outcome: Progressing Goal: Respiratory complications will improve Outcome: Progressing Goal: Cardiovascular complication will be avoided Outcome: Progressing   Problem: Safety: Goal: Ability to remain free from injury will improve Outcome: Progressing   Problem: Education: Goal: Knowledge of risk factors and measures for prevention of condition will improve Outcome: Progressing   Problem: Respiratory: Goal: Will maintain a patent airway Outcome: Progressing Goal: Complications related to the disease process, condition or treatment will be avoided or minimized Outcome: Progressing   Problem: Education: Goal: Ability to identify signs and symptoms of gastrointestinal bleeding will improve Outcome: Progressing   Problem: Bowel/Gastric: Goal: Will show no signs and symptoms of gastrointestinal bleeding Outcome: Progressing   Problem: Fluid Volume: Goal: Will show no signs and symptoms of excessive bleeding Outcome: Progressing   Problem: Clinical Measurements: Goal: Complications related to the disease process, condition or treatment will be avoided or minimized Outcome: Progressing

## 2021-02-12 NOTE — Progress Notes (Signed)
[facility] Progress Note   Subjective:  Patient not seen directly today given COVID-19 + status, utilizing data taken from chart +/- discussions w/ providers and staff.    Objective Vitals:   02/12/21 0721 02/12/21 0730 02/12/21 0758 02/12/21 0845  BP: 111/66 120/76 132/78 118/76  Pulse:  (!) 108    Resp: '13 13 13 15  '$ Temp:   98.2 F (36.8 C) 98.2 F (36.8 C)  TempSrc:   Oral   SpO2: 100%  96% 100%  Weight:   52.1 kg   Height:       Physical Exam Patient not seen directly today given COVID-19 + status, utilizing data taken from chart +/- discussions w/ providers and staff.     Dialysis Orders: MWF [place]   3.5h  350/500  51kg  2/2 bath  P4  AVF  Hep 2000+ [id number] - Mircera 100 mcg IV q 2 weeks (last dose 01/28/2021) - Venofer 50 mg IV q week - Hectorol 3 mcg IV TIW   Assessment/Plan:  UGIB: S/p EGD 9/1, found 3 small, bleeding AVMs, which were treated with APC and endoclip.  COVID 19 PNA: sp course of Remdesivir  ESRD: MWF HD. Had HD overnight last night. For dc home today. Should go to her usual OP HD session on Wed.  Hypertension/volume: home medications have been resumed  Anemia of ESRD: Hgb 10.2, better. ESA due on 9/5. Holding IV iron for now.  Metabolic bone disease: Labs decent. Continue binder, VDRA, sensipar.   Nutrition: Continue protein supplement. 9.    Hx chronic pancreatitis   RKelly Splinter MD 02/12/2021, 10:40 AM         Additional Objective Labs: Basic Metabolic Panel: Recent Labs  Lab 02/06/21 0929 02/07/21 0432 02/09/21 0049 02/10/21 0540 02/11/21 0052  NA 137   < > 135 133* 131*  K 5.3*   < > 5.5* 4.4 5.1  CL 97*   < > 96* 93* 92*  CO2 24   < > '25 25 24  '$ GLUCOSE 87   < > 90 150* 134*  BUN 102*   < > 51* 34* 58*  CREATININE 18.72*   < > 12.34* 8.28* 10.62*  CALCIUM 9.1   < > 8.6* 8.9 8.7*  PHOS 5.6*  --   --   --   --    < > = values in this interval not displayed.    Liver Function Tests: Recent Labs  Lab  02/09/21 0049 02/10/21 0540 02/11/21 0052  AST 31 14* 14*  ALT '11 9 7  '$ ALKPHOS 38 46 43  BILITOT 0.4 0.6 0.4  PROT 6.8 6.7 6.4*  ALBUMIN 3.2* 3.2* 3.1*    Recent Labs  Lab 02/05/21 2047  LIPASE 59*    CBC: Recent Labs  Lab 02/05/21 2355 02/06/21 0929 02/08/21 0104 02/09/21 0049 02/10/21 0540 02/11/21 0052 02/12/21 0500  WBC 6.6   < > 5.0 8.2 7.3 9.4 11.6*  NEUTROABS 3.7  --   --   --   --   --   --   HGB 9.8*   < > 9.7* 10.1* 10.2* 10.1* 10.0*  HCT 31.2*   < > 29.8* 32.5* 31.8* 30.7* 30.5*  MCV 90.4   < > 89.5 89.3 88.3 87.7 87.6  PLT 274   < > 210 243 244 201 253   < > = values in this interval not displayed.    Medications:    amLODipine  10 mg Oral Q1200  atorvastatin  20 mg Oral QHS   cinacalcet  30 mg Oral QHS   cloNIDine  0.1 mg Oral BID   doxercalciferol  3 mcg Oral Q M,W,F-HD   doxycycline  100 mg Oral Q12H   feeding supplement  1 Container Oral TID BM   hydrALAZINE  75 mg Oral Q8H   lidocaine  1 patch Transdermal Q24H   metoprolol succinate  12.5 mg Oral Daily   mirtazapine  15 mg Oral QHS   pantoprazole  40 mg Oral Q0600   predniSONE  30 mg Oral BID WC   sevelamer carbonate  1,600 mg Oral TID WC

## 2021-02-12 NOTE — Progress Notes (Signed)
Patient could not be discharged yesterday as she went very late for her hemodialysis.  Completed early this morning.  Patient was seen after dialysis session.  She feels well.  Denies any shortness of breath.  Generalized body aches have improved.  Continues to have a cough.  She was reassured about this.  Her vital signs are all stable including her room air oxygen saturations.  Please review discharge summary from 9/5.  Remains stable for discharge.  Discussed with patient and with nursing staff.  Bonnielee Haff 02/12/2021

## 2021-02-12 NOTE — Progress Notes (Signed)
Hemodialysis- Tolerated well. UF 1.4L. Very mild chest tighness during last half hour of treatment relieved with uf off/bfr reduction and 100 cc saline bolus. Patient currently denies pain/discomfort. Sats 96% on room air. Report called to primary RN.

## 2021-02-13 ENCOUNTER — Telehealth: Payer: Self-pay | Admitting: Nephrology

## 2021-02-13 NOTE — Telephone Encounter (Signed)
Transition of care contact from inpatient facility  Date of discharge: 02/11/21 Date of contact: 02/13/21 Method: Phone Spoke to: Patient  Patient contacted to discuss transition of care from recent inpatient hospitalization. Patient was admitted to [facility] from .02/05/21 - 02/11/21.Marland Kitchen with discharge diagnosis of .Upper Gi Bld 2./2 avms, Covid Pna.  Medication changes were reviewed.  Patient will follow up with his/her outpatient HD unit on:02/15/21

## 2021-04-10 ENCOUNTER — Encounter (HOSPITAL_COMMUNITY): Payer: Self-pay | Admitting: Emergency Medicine

## 2021-04-10 ENCOUNTER — Emergency Department (HOSPITAL_COMMUNITY)
Admission: EM | Admit: 2021-04-10 | Discharge: 2021-04-10 | Disposition: A | Discharge status: Home / Self Care | Payer: Medicare Other | Source: Home / Self Care | Attending: Emergency Medicine | Admitting: Emergency Medicine

## 2021-04-10 ENCOUNTER — Other Ambulatory Visit: Payer: Self-pay

## 2021-04-10 DIAGNOSIS — N186 End stage renal disease: Secondary | ICD-10-CM | POA: Insufficient documentation

## 2021-04-10 DIAGNOSIS — Z992 Dependence on renal dialysis: Secondary | ICD-10-CM | POA: Insufficient documentation

## 2021-04-10 DIAGNOSIS — Z79899 Other long term (current) drug therapy: Secondary | ICD-10-CM | POA: Insufficient documentation

## 2021-04-10 DIAGNOSIS — IMO0002 Reserved for concepts with insufficient information to code with codable children: Secondary | ICD-10-CM

## 2021-04-10 DIAGNOSIS — K859 Acute pancreatitis without necrosis or infection, unspecified: Secondary | ICD-10-CM | POA: Diagnosis not present

## 2021-04-10 DIAGNOSIS — I120 Hypertensive chronic kidney disease with stage 5 chronic kidney disease or end stage renal disease: Secondary | ICD-10-CM | POA: Insufficient documentation

## 2021-04-10 DIAGNOSIS — F17210 Nicotine dependence, cigarettes, uncomplicated: Secondary | ICD-10-CM | POA: Insufficient documentation

## 2021-04-10 LAB — COMPREHENSIVE METABOLIC PANEL
ALT: 12 U/L (ref 0–44)
AST: 15 U/L (ref 15–41)
Albumin: 4 g/dL (ref 3.5–5.0)
Alkaline Phosphatase: 116 U/L (ref 38–126)
Anion gap: 18 — ABNORMAL HIGH (ref 5–15)
BUN: 69 mg/dL — ABNORMAL HIGH (ref 8–23)
CO2: 25 mmol/L (ref 22–32)
Calcium: 9 mg/dL (ref 8.9–10.3)
Chloride: 93 mmol/L — ABNORMAL LOW (ref 98–111)
Creatinine, Ser: 11.61 mg/dL — ABNORMAL HIGH (ref 0.44–1.00)
GFR, Estimated: 3 mL/min — ABNORMAL LOW (ref >60)
Glucose, Bld: 100 mg/dL — ABNORMAL HIGH (ref 70–99)
Potassium: 5.1 mmol/L (ref 3.5–5.1)
Sodium: 136 mmol/L (ref 135–145)
Total Bilirubin: 0.7 mg/dL (ref 0.3–1.2)
Total Protein: 7.5 g/dL (ref 6.5–8.1)

## 2021-04-10 LAB — CBC
HCT: 41.5 % (ref 36.0–46.0)
Hemoglobin: 13.4 g/dL (ref 12.0–15.0)
MCH: 30.1 pg (ref 26.0–34.0)
MCHC: 32.3 g/dL (ref 30.0–36.0)
MCV: 93.3 fL (ref 80.0–100.0)
Platelets: 250 10*3/uL (ref 150–400)
RBC: 4.45 MIL/uL (ref 3.87–5.11)
RDW: 17.2 % — ABNORMAL HIGH (ref 11.5–15.5)
WBC: 8 10*3/uL (ref 4.0–10.5)
nRBC: 0 % (ref 0.0–0.2)

## 2021-04-10 LAB — LIPASE, BLOOD: Lipase: 274 U/L — ABNORMAL HIGH (ref 11–51)

## 2021-04-10 MED ORDER — HYDROMORPHONE HCL 1 MG/ML IJ SOLN
1.0000 mg | Freq: Once | INTRAMUSCULAR | Status: AC
  Administered 2021-04-10: 1 mg via INTRAVENOUS
  Filled 2021-04-10: qty 1

## 2021-04-10 MED ORDER — ONDANSETRON 4 MG PO TBDP: 4.0000 mg | ORAL_TABLET | Freq: Three times a day (TID) | ORAL | 0 refills | Status: DC | PRN

## 2021-04-10 MED ORDER — OXYCODONE HCL 5 MG PO TABS: 5.0000 mg | ORAL_TABLET | ORAL | 0 refills | Status: DC | PRN

## 2021-04-10 NOTE — Discharge Instructions (Signed)
If you develop worsening, continued, or recurrent abdominal pain, uncontrolled vomiting, fever, chest or back pain, or any other new/concerning symptoms then return to the ER for evaluation.  

## 2021-04-10 NOTE — ED Notes (Signed)
Patient given discharge instructions. Questions were answered. Patient verbalized understanding of discharge instructions and care at home.  

## 2021-04-10 NOTE — ED Triage Notes (Signed)
Per EMS, pt from home, c/o abdominal pain and nausea since yesterday.  Pt has dialysis on M,W, F, w/ last full treatment on Monday.   168/96 98%RA 18 RR 164 CBG
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
## 2021-04-11 ENCOUNTER — Other Ambulatory Visit: Payer: Self-pay

## 2021-04-11 ENCOUNTER — Inpatient Hospital Stay (HOSPITAL_COMMUNITY)
Admission: EM | Admit: 2021-04-11 | Discharge: 2021-04-16 | DRG: 438 | Disposition: A | Discharge status: Home / Self Care | Payer: Medicare Other | Attending: Internal Medicine | Admitting: Internal Medicine

## 2021-04-11 ENCOUNTER — Encounter (HOSPITAL_COMMUNITY): Payer: Self-pay

## 2021-04-11 DIAGNOSIS — E785 Hyperlipidemia, unspecified: Secondary | ICD-10-CM | POA: Diagnosis present

## 2021-04-11 DIAGNOSIS — K219 Gastro-esophageal reflux disease without esophagitis: Secondary | ICD-10-CM | POA: Diagnosis present

## 2021-04-11 DIAGNOSIS — K859 Acute pancreatitis without necrosis or infection, unspecified: Principal | ICD-10-CM | POA: Diagnosis present

## 2021-04-11 DIAGNOSIS — Z992 Dependence on renal dialysis: Secondary | ICD-10-CM

## 2021-04-11 DIAGNOSIS — M898X9 Other specified disorders of bone, unspecified site: Secondary | ICD-10-CM | POA: Diagnosis present

## 2021-04-11 DIAGNOSIS — F17210 Nicotine dependence, cigarettes, uncomplicated: Secondary | ICD-10-CM | POA: Diagnosis present

## 2021-04-11 DIAGNOSIS — R001 Bradycardia, unspecified: Secondary | ICD-10-CM | POA: Diagnosis present

## 2021-04-11 DIAGNOSIS — I160 Hypertensive urgency: Secondary | ICD-10-CM | POA: Diagnosis present

## 2021-04-11 DIAGNOSIS — E875 Hyperkalemia: Secondary | ICD-10-CM | POA: Diagnosis present

## 2021-04-11 DIAGNOSIS — K8500 Idiopathic acute pancreatitis without necrosis or infection: Secondary | ICD-10-CM

## 2021-04-11 DIAGNOSIS — Z83438 Family history of other disorder of lipoprotein metabolism and other lipidemia: Secondary | ICD-10-CM

## 2021-04-11 DIAGNOSIS — Z8673 Personal history of transient ischemic attack (TIA), and cerebral infarction without residual deficits: Secondary | ICD-10-CM

## 2021-04-11 DIAGNOSIS — Z20822 Contact with and (suspected) exposure to covid-19: Secondary | ICD-10-CM | POA: Diagnosis present

## 2021-04-11 DIAGNOSIS — D631 Anemia in chronic kidney disease: Secondary | ICD-10-CM | POA: Diagnosis present

## 2021-04-11 DIAGNOSIS — I120 Hypertensive chronic kidney disease with stage 5 chronic kidney disease or end stage renal disease: Secondary | ICD-10-CM | POA: Diagnosis present

## 2021-04-11 DIAGNOSIS — Z8249 Family history of ischemic heart disease and other diseases of the circulatory system: Secondary | ICD-10-CM

## 2021-04-11 DIAGNOSIS — K861 Other chronic pancreatitis: Secondary | ICD-10-CM | POA: Diagnosis present

## 2021-04-11 DIAGNOSIS — N186 End stage renal disease: Secondary | ICD-10-CM | POA: Diagnosis present

## 2021-04-11 DIAGNOSIS — Z886 Allergy status to analgesic agent status: Secondary | ICD-10-CM

## 2021-04-11 LAB — LIPASE, BLOOD: Lipase: 153 U/L — ABNORMAL HIGH (ref 11–51)

## 2021-04-11 LAB — CBC
HCT: 38.5 % (ref 36.0–46.0)
Hemoglobin: 12.6 g/dL (ref 12.0–15.0)
MCH: 30 pg (ref 26.0–34.0)
MCHC: 32.7 g/dL (ref 30.0–36.0)
MCV: 91.7 fL (ref 80.0–100.0)
Platelets: 239 10*3/uL (ref 150–400)
RBC: 4.2 MIL/uL (ref 3.87–5.11)
RDW: 17.1 % — ABNORMAL HIGH (ref 11.5–15.5)
WBC: 7.6 10*3/uL (ref 4.0–10.5)
nRBC: 0 % (ref 0.0–0.2)

## 2021-04-11 LAB — COMPREHENSIVE METABOLIC PANEL
ALT: 8 U/L (ref 0–44)
AST: 14 U/L — ABNORMAL LOW (ref 15–41)
Albumin: 4 g/dL (ref 3.5–5.0)
Alkaline Phosphatase: 117 U/L (ref 38–126)
Anion gap: 19 — ABNORMAL HIGH (ref 5–15)
BUN: 81 mg/dL — ABNORMAL HIGH (ref 8–23)
CO2: 25 mmol/L (ref 22–32)
Calcium: 9 mg/dL (ref 8.9–10.3)
Chloride: 89 mmol/L — ABNORMAL LOW (ref 98–111)
Creatinine, Ser: 15.01 mg/dL — ABNORMAL HIGH (ref 0.44–1.00)
GFR, Estimated: 2 mL/min — ABNORMAL LOW (ref >60)
Glucose, Bld: 84 mg/dL (ref 70–99)
Potassium: 5.6 mmol/L — ABNORMAL HIGH (ref 3.5–5.1)
Sodium: 133 mmol/L — ABNORMAL LOW (ref 135–145)
Total Bilirubin: 0.9 mg/dL (ref 0.3–1.2)
Total Protein: 7.6 g/dL (ref 6.5–8.1)

## 2021-04-11 NOTE — ED Provider Notes (Signed)
Emergency Medicine Provider Triage Evaluation Note  Ashley [name] , a 69 y.o. female  was evaluated in triage.  Pt complains of abdominal pain.  Patient was seen in the emergency department yesterday and diagnosed with pancreatitis, she was discharged home with pain medication.  She states that she is trying to take her pain medicine but is not helping.  She also endorses nausea without vomiting.  She states she has not been able to eat in several days.  Review of Systems  Positive: Abdominal pain, nausea, anorexia Negative: Fevers, chills, chest pain, shortness of breath  Physical Exam  BP (!) 196/73 (BP Location: Right Arm)   Pulse (!) 47   Temp 98.2 F (36.8 C) (Oral)   Resp 15   Ht 5\' 2"  (1.575 m)   Wt 53.1 kg   SpO2 100%   BMI 21.40 kg/m  Gen:   Awake, no distress   Resp:  Normal effort  MSK:   Moves extremities without difficulty  Other:    Medical Decision Making  Medically screening exam initiated at 8:58 PM.  Appropriate orders placed.  Ashley [name] was informed that the remainder of the evaluation will be completed by another provider, this initial triage assessment does not replace that evaluation, and the importance of remaining in the ED until their evaluation is complete.     Estill Cotta 04/11/21 2059    Wyvonnia Dusky, MD 04/11/21 2154

## 2021-04-11 NOTE — ED Triage Notes (Signed)
Pt reports abdominal pain, states she was seen here yesterday and told she had pancreatitis and was given Oxycodone and d/c. She took them but states they are not helping with the pain. She endorses nausea but no vomiting.

## 2021-04-12 ENCOUNTER — Emergency Department (HOSPITAL_COMMUNITY): Payer: Medicare Other

## 2021-04-12 ENCOUNTER — Encounter (HOSPITAL_COMMUNITY): Payer: Self-pay | Admitting: Family Medicine

## 2021-04-12 DIAGNOSIS — I160 Hypertensive urgency: Secondary | ICD-10-CM | POA: Diagnosis present

## 2021-04-12 DIAGNOSIS — E785 Hyperlipidemia, unspecified: Secondary | ICD-10-CM | POA: Diagnosis present

## 2021-04-12 DIAGNOSIS — K861 Other chronic pancreatitis: Secondary | ICD-10-CM | POA: Diagnosis present

## 2021-04-12 DIAGNOSIS — Z8249 Family history of ischemic heart disease and other diseases of the circulatory system: Secondary | ICD-10-CM | POA: Diagnosis not present

## 2021-04-12 DIAGNOSIS — Z992 Dependence on renal dialysis: Secondary | ICD-10-CM | POA: Diagnosis not present

## 2021-04-12 DIAGNOSIS — Z20822 Contact with and (suspected) exposure to covid-19: Secondary | ICD-10-CM | POA: Diagnosis present

## 2021-04-12 DIAGNOSIS — Z886 Allergy status to analgesic agent status: Secondary | ICD-10-CM | POA: Diagnosis not present

## 2021-04-12 DIAGNOSIS — F17210 Nicotine dependence, cigarettes, uncomplicated: Secondary | ICD-10-CM | POA: Diagnosis present

## 2021-04-12 DIAGNOSIS — E875 Hyperkalemia: Secondary | ICD-10-CM | POA: Diagnosis present

## 2021-04-12 DIAGNOSIS — Z8673 Personal history of transient ischemic attack (TIA), and cerebral infarction without residual deficits: Secondary | ICD-10-CM | POA: Diagnosis not present

## 2021-04-12 DIAGNOSIS — M898X9 Other specified disorders of bone, unspecified site: Secondary | ICD-10-CM | POA: Diagnosis present

## 2021-04-12 DIAGNOSIS — D631 Anemia in chronic kidney disease: Secondary | ICD-10-CM | POA: Diagnosis present

## 2021-04-12 DIAGNOSIS — N186 End stage renal disease: Secondary | ICD-10-CM | POA: Diagnosis present

## 2021-04-12 DIAGNOSIS — K219 Gastro-esophageal reflux disease without esophagitis: Secondary | ICD-10-CM | POA: Diagnosis present

## 2021-04-12 DIAGNOSIS — I120 Hypertensive chronic kidney disease with stage 5 chronic kidney disease or end stage renal disease: Secondary | ICD-10-CM | POA: Diagnosis present

## 2021-04-12 DIAGNOSIS — K859 Acute pancreatitis without necrosis or infection, unspecified: Secondary | ICD-10-CM | POA: Diagnosis present

## 2021-04-12 DIAGNOSIS — R001 Bradycardia, unspecified: Secondary | ICD-10-CM | POA: Diagnosis present

## 2021-04-12 DIAGNOSIS — Z83438 Family history of other disorder of lipoprotein metabolism and other lipidemia: Secondary | ICD-10-CM | POA: Diagnosis not present

## 2021-04-12 LAB — POTASSIUM
Potassium: 5.7 mmol/L — ABNORMAL HIGH (ref 3.5–5.1)
Potassium: 4 mmol/L (ref 3.5–5.1)
Potassium: 4.5 mmol/L (ref 3.5–5.1)

## 2021-04-12 LAB — BASIC METABOLIC PANEL
Anion gap: 15 (ref 5–15)
BUN: 88 mg/dL — ABNORMAL HIGH (ref 8–23)
CO2: 26 mmol/L (ref 22–32)
Calcium: 8.5 mg/dL — ABNORMAL LOW (ref 8.9–10.3)
Chloride: 89 mmol/L — ABNORMAL LOW (ref 98–111)
Creatinine, Ser: 16.32 mg/dL — ABNORMAL HIGH (ref 0.44–1.00)
GFR, Estimated: 2 mL/min — ABNORMAL LOW (ref >60)
Glucose, Bld: 90 mg/dL (ref 70–99)
Potassium: 6.8 mmol/L (ref 3.5–5.1)
Sodium: 130 mmol/L — ABNORMAL LOW (ref 135–145)

## 2021-04-12 LAB — CBC
HCT: 34.2 % — ABNORMAL LOW (ref 36.0–46.0)
Hemoglobin: 11.1 g/dL — ABNORMAL LOW (ref 12.0–15.0)
MCH: 29.8 pg (ref 26.0–34.0)
MCHC: 32.5 g/dL (ref 30.0–36.0)
MCV: 91.9 fL (ref 80.0–100.0)
Platelets: 219 10*3/uL (ref 150–400)
RBC: 3.72 MIL/uL — ABNORMAL LOW (ref 3.87–5.11)
RDW: 16.8 % — ABNORMAL HIGH (ref 11.5–15.5)
WBC: 7.4 10*3/uL (ref 4.0–10.5)
nRBC: 0 % (ref 0.0–0.2)

## 2021-04-12 LAB — HEPATITIS B SURFACE ANTIGEN: Hepatitis B Surface Ag: NONREACTIVE

## 2021-04-12 LAB — LACTIC ACID, PLASMA
Lactic Acid, Venous: 1.2 mmol/L (ref 0.5–1.9)
Lactic Acid, Venous: 1.9 mmol/L (ref 0.5–1.9)

## 2021-04-12 LAB — RESP PANEL BY RT-PCR (FLU A&B, COVID) ARPGX2
Influenza A by PCR: NEGATIVE
Influenza B by PCR: NEGATIVE
SARS Coronavirus 2 by RT PCR: NEGATIVE

## 2021-04-12 LAB — HEPATITIS B SURFACE ANTIBODY,QUALITATIVE: Hep B S Ab: REACTIVE — AB

## 2021-04-12 MED ORDER — ATORVASTATIN CALCIUM 10 MG PO TABS
20.0000 mg | ORAL_TABLET | Freq: Every day | ORAL | Status: DC
  Administered 2021-04-12 – 2021-04-15 (×4): 20 mg via ORAL
  Filled 2021-04-12 (×4): qty 2

## 2021-04-12 MED ORDER — AMLODIPINE BESYLATE 5 MG PO TABS
5.0000 mg | ORAL_TABLET | Freq: Every day | ORAL | Status: DC
  Administered 2021-04-12 – 2021-04-16 (×4): 5 mg via ORAL
  Filled 2021-04-12 (×5): qty 1

## 2021-04-12 MED ORDER — ONDANSETRON HCL 4 MG/2ML IJ SOLN: 4.0000 mg | Freq: Four times a day (QID) | INTRAMUSCULAR | Status: DC | PRN

## 2021-04-12 MED ORDER — DEXTROSE 50 % IV SOLN
1.0000 | Freq: Once | INTRAVENOUS | Status: AC
  Administered 2021-04-12: 50 mL via INTRAVENOUS
  Filled 2021-04-12: qty 50

## 2021-04-12 MED ORDER — CALCIUM GLUCONATE-NACL 1-0.675 GM/50ML-% IV SOLN
1.0000 g | Freq: Once | INTRAVENOUS | Status: AC
  Administered 2021-04-12: 1000 mg via INTRAVENOUS
  Filled 2021-04-12: qty 50

## 2021-04-12 MED ORDER — MIRTAZAPINE 15 MG PO TABS
15.0000 mg | ORAL_TABLET | Freq: Every day | ORAL | Status: DC
  Administered 2021-04-12 – 2021-04-15 (×4): 15 mg via ORAL
  Filled 2021-04-12 (×5): qty 1

## 2021-04-12 MED ORDER — HEPARIN SODIUM (PORCINE) 5000 UNIT/ML IJ SOLN
5000.0000 [IU] | Freq: Three times a day (TID) | INTRAMUSCULAR | Status: DC
  Administered 2021-04-12 – 2021-04-16 (×12): 5000 [IU] via SUBCUTANEOUS
  Filled 2021-04-12 (×13): qty 1

## 2021-04-12 MED ORDER — ACETAMINOPHEN 650 MG RE SUPP: 650.0000 mg | Freq: Four times a day (QID) | RECTAL | Status: DC | PRN

## 2021-04-12 MED ORDER — SODIUM BICARBONATE 8.4 % IV SOLN
50.0000 meq | Freq: Once | INTRAVENOUS | Status: AC
  Administered 2021-04-12: 50 meq via INTRAVENOUS
  Filled 2021-04-12: qty 50

## 2021-04-12 MED ORDER — CLONIDINE HCL 0.1 MG PO TABS
0.1000 mg | ORAL_TABLET | Freq: Three times a day (TID) | ORAL | Status: DC
  Administered 2021-04-12: 0.1 mg via ORAL
  Filled 2021-04-12: qty 1

## 2021-04-12 MED ORDER — SODIUM CHLORIDE 0.9% FLUSH: 3.0000 mL | INTRAVENOUS | Status: DC | PRN

## 2021-04-12 MED ORDER — SODIUM CHLORIDE 0.9% FLUSH
3.0000 mL | Freq: Two times a day (BID) | INTRAVENOUS | Status: DC
  Administered 2021-04-12 – 2021-04-16 (×9): 3 mL via INTRAVENOUS

## 2021-04-12 MED ORDER — HYDRALAZINE HCL 50 MG PO TABS
75.0000 mg | ORAL_TABLET | Freq: Three times a day (TID) | ORAL | Status: DC
  Administered 2021-04-12 – 2021-04-16 (×13): 75 mg via ORAL
  Filled 2021-04-12 (×8): qty 1
  Filled 2021-04-12: qty 3
  Filled 2021-04-12 (×4): qty 1

## 2021-04-12 MED ORDER — INSULIN ASPART 100 UNIT/ML IV SOLN
5.0000 [IU] | Freq: Once | INTRAVENOUS | Status: AC
  Administered 2021-04-12: 5 [IU] via INTRAVENOUS

## 2021-04-12 MED ORDER — SENNOSIDES-DOCUSATE SODIUM 8.6-50 MG PO TABS: 1.0000 | ORAL_TABLET | Freq: Every evening | ORAL | Status: DC | PRN

## 2021-04-12 MED ORDER — HYDROMORPHONE HCL 1 MG/ML IJ SOLN
1.0000 mg | Freq: Once | INTRAMUSCULAR | Status: AC
Start: 2021-04-12 — End: 2021-04-12
  Administered 2021-04-12: 1 mg via INTRAVENOUS
  Filled 2021-04-12: qty 1

## 2021-04-12 MED ORDER — CINACALCET HCL 30 MG PO TABS
60.0000 mg | ORAL_TABLET | Freq: Every evening | ORAL | Status: DC
  Administered 2021-04-12 – 2021-04-15 (×4): 60 mg via ORAL
  Filled 2021-04-12 (×5): qty 2

## 2021-04-12 MED ORDER — HEPARIN SODIUM (PORCINE) 1000 UNIT/ML DIALYSIS
2000.0000 [IU] | INTRAMUSCULAR | Status: DC | PRN
  Administered 2021-04-12: 2000 [IU] via INTRAVENOUS_CENTRAL
  Filled 2021-04-12 (×3): qty 2

## 2021-04-12 MED ORDER — CLONIDINE HCL 0.2 MG PO TABS
0.2000 mg | ORAL_TABLET | Freq: Once | ORAL | Status: AC
  Administered 2021-04-12: 0.2 mg via ORAL
  Filled 2021-04-12: qty 1

## 2021-04-12 MED ORDER — HYDROMORPHONE HCL 1 MG/ML IJ SOLN
1.0000 mg | Freq: Once | INTRAMUSCULAR | Status: AC
  Administered 2021-04-12: 1 mg via INTRAVENOUS
  Filled 2021-04-12: qty 1

## 2021-04-12 MED ORDER — SEVELAMER CARBONATE 800 MG PO TABS
800.0000 mg | ORAL_TABLET | Freq: Three times a day (TID) | ORAL | Status: DC
  Administered 2021-04-12 – 2021-04-16 (×7): 800 mg via ORAL
  Filled 2021-04-12 (×10): qty 1

## 2021-04-12 MED ORDER — CHLORHEXIDINE GLUCONATE CLOTH 2 % EX PADS
6.0000 | MEDICATED_PAD | Freq: Every day | CUTANEOUS | Status: DC
  Administered 2021-04-13 – 2021-04-16 (×4): 6 via TOPICAL

## 2021-04-12 MED ORDER — HYDROMORPHONE HCL 1 MG/ML IJ SOLN
1.0000 mg | INTRAMUSCULAR | Status: AC | PRN
  Administered 2021-04-12 (×2): 1 mg via INTRAVENOUS
  Filled 2021-04-12 (×2): qty 1

## 2021-04-12 MED ORDER — ACETAMINOPHEN 325 MG PO TABS: 650.0000 mg | ORAL_TABLET | Freq: Four times a day (QID) | ORAL | Status: DC | PRN

## 2021-04-12 MED ORDER — SODIUM CHLORIDE 0.9 % IV SOLN: 250.0000 mL | INTRAVENOUS | Status: DC | PRN

## 2021-04-12 MED ORDER — SODIUM ZIRCONIUM CYCLOSILICATE 10 G PO PACK
10.0000 g | PACK | Freq: Once | ORAL | Status: DC
  Filled 2021-04-12: qty 1

## 2021-04-12 MED ORDER — IOHEXOL 300 MG/ML  SOLN
100.0000 mL | Freq: Once | INTRAMUSCULAR | Status: AC | PRN
  Administered 2021-04-12: 100 mL via INTRAVENOUS

## 2021-04-12 MED ORDER — ONDANSETRON HCL 4 MG PO TABS: 4.0000 mg | ORAL_TABLET | Freq: Four times a day (QID) | ORAL | Status: DC | PRN

## 2021-04-12 MED ORDER — HYDROMORPHONE HCL 1 MG/ML IJ SOLN
0.5000 mg | INTRAMUSCULAR | Status: DC | PRN
  Administered 2021-04-12 – 2021-04-15 (×12): 0.5 mg via INTRAVENOUS
  Filled 2021-04-12 (×12): qty 0.5

## 2021-04-12 MED ORDER — AMLODIPINE BESYLATE 5 MG PO TABS: 10.0000 mg | ORAL_TABLET | Freq: Every day | ORAL | Status: DC

## 2021-04-12 MED ORDER — OXYCODONE HCL 5 MG PO TABS
5.0000 mg | ORAL_TABLET | ORAL | Status: DC | PRN
  Administered 2021-04-12 – 2021-04-16 (×8): 5 mg via ORAL
  Filled 2021-04-12 (×8): qty 1

## 2021-04-12 MED ORDER — ALBUTEROL SULFATE (2.5 MG/3ML) 0.083% IN NEBU
10.0000 mg | INHALATION_SOLUTION | Freq: Once | RESPIRATORY_TRACT | Status: AC
  Administered 2021-04-12: 10 mg via RESPIRATORY_TRACT
  Filled 2021-04-12: qty 12

## 2021-04-12 MED ORDER — ONDANSETRON HCL 4 MG/2ML IJ SOLN
4.0000 mg | Freq: Once | INTRAMUSCULAR | Status: AC
  Administered 2021-04-12: 4 mg via INTRAVENOUS
  Filled 2021-04-12: qty 2
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
## 2021-04-12 NOTE — ED Notes (Signed)
Patient transported to CT 

## 2021-04-12 NOTE — Progress Notes (Signed)
PROGRESS NOTE    Ashley [name]  WLN:[id number] DOB: 11-22-1950 DOA: 04/11/2021 PCP: Sonia Side., FNP   Brief Narrative: 70 year old with past medical history significant for hypertension, pancreatitis, HLD, ESRD on hemodialysis who presents complaining of abdominal pain, nausea and vomiting.  She reports epigastric pain 9 out of 10.  She reports similar episodes previously related to pancreatitis. Evaluation in the ED she was found to have high blood pressure, CT scan show acute on chronic pancreatitis no pancreatic necrosis.  Potassium 5.6, creatinine 15, AST 14, ALT 8, lipase 153.  Patient subsequently developed bradycardia with heart rate in the 40s, repeated B med potassium 6.8.  Insulin, glucose, calcium gluconate, albuterol and bicarb order. Nephrology consulted and they will arrange hemodialysis.    Assessment & Plan:   Principal Problem:   Acute on chronic pancreatitis (HCC) Active Problems:   ESRD on hemodialysis (HCC)   Pancreatitis   Hyperkalemia   Hypertensive urgency  1-Bradycardia; She is alert and conversant, denies chest pain, dyspnea, dizziness. EKG atrial bradycardia.  -Repeated B-met  show potassium was 6.8. -Calcium gluconate, potassium elevated earlier.  -Hold Clonidine.  -BP stable. Hold norvasc to avoid decreasing BP.  -Renal consulted for HD>  -Insulin, glucose, albuterol and bicarb ordered.  Nephrologist will arrange for urgent hemodialysis. -Plan to hold clonidine  2-Acute Pancreatitis.  Reports abdominal pain has improved.  Plan to continue with clear diet.  Pain management. Recent ultrasound 01/2021 negative for gallstone.  3-ESRD; hemodialysis: Nephrology has been contacted.  4-Hyperkalemia;  Likely related to ESRD Insulin, glucose albuterol bicarb ordered.  Nephrology will proceed with emergent hemodialysis.  HTN Urgency:  Hold clonidine due to bradycardia.  Continue with hydralazine.  Will resume Norvasc.  Estimated body mass index  is 21.4 kg/m as calculated from the following:   Height as of this encounter: _0  (1.575 m).   Weight as of this encounter: 53.1 kg.   DVT prophylaxis: Heparin Code Status: Full code Family Communication: Care discussed with patient Disposition Plan:  Status is: Inpatient  Remains inpatient appropriate because: Patient presented with bradycardia, hyperkalemia, acute pancreatitis        Consultants:  Nephrology  Procedures:  Hemodialysis  Antimicrobials:    Subjective: Is alert and conversant, she denies chest pain, shortness of breath, lightheadedness.  She reports some improvement of abdominal pain.   Objective: Vitals:   04/12/21 1430 04/12/21 1500 04/12/21 1530 04/12/21 1543  BP: (!) 156/96 (!) 149/95 (!) 156/102 (!) 156/104  Pulse: 98 97 99 (!) 102  Resp: 19 11    Temp:      TempSrc:      SpO2:      Weight:      Height:        Intake/Output Summary (Last 24 hours) at 04/12/2021 1618 Last data filed at 04/12/2021 1003 Gross per 24 hour  Intake 0.5 ml  Output --  Net 0.5 ml   Filed Weights   04/11/21 2054  Weight: 53.1 kg    Examination:  General exam: Appears calm and comfortable  Respiratory system: Clear to auscultation. Respiratory effort normal. Cardiovascular system: S1 & S2 heard, RRR. No JVD, murmurs, rubs, gallops or clicks. No pedal edema. Gastrointestinal system: Abdomen is nondistended, soft and nontender. No organomegaly or masses felt. Normal bowel sounds heard. Central nervous system: Alert and oriented.  Extremities: Symmetric 5 x 5 power.   Data Reviewed: I have personally reviewed following labs and imaging studies  CBC: Recent Labs  Lab 04/10/21  7829 04/11/21 2059 04/12/21 0840  WBC 8.0 7.6 7.4  HGB 13.4 12.6 11.1*  HCT 41.5 38.5 34.2*  MCV 93.3 91.7 91.9  PLT 250 239 562   Basic Metabolic Panel: Recent Labs  Lab 04/10/21 0724 04/11/21 2059 04/12/21 0840 04/12/21 1043  NA 136 133* 130*  --   K 5.1 5.6* 6.8*  5.7*  CL 93* 89* 89*  --   CO2 _0 --   GLUCOSE 100* 84 90  --   BUN 69* 81* 88*  --   CREATININE 11.61* 15.01* 16.32*  --   CALCIUM 9.0 9.0 8.5*  --    GFR: Estimated Creatinine Clearance: 2.5 mL/min (A) (by C-G formula based on SCr of 16.32 mg/dL (H)). Liver Function Tests: Recent Labs  Lab 04/10/21 0724 04/11/21 2059  AST 15 14*  ALT 12 8  ALKPHOS 116 117  BILITOT 0.7 0.9  PROT 7.5 7.6  ALBUMIN 4.0 4.0   Recent Labs  Lab 04/10/21 0724 04/11/21 2059  LIPASE 274* 153*   No results for input(s): AMMONIA in the last 168 hours. Coagulation Profile: No results for input(s): INR, PROTIME in the last 168 hours. Cardiac Enzymes: No results for input(s): CKTOTAL, CKMB, CKMBINDEX, TROPONINI in the last 168 hours. BNP (last 3 results) No results for input(s): PROBNP in the last 8760 hours. HbA1C: No results for input(s): HGBA1C in the last 72 hours. CBG: No results for input(s): GLUCAP in the last 168 hours. Lipid Profile: No results for input(s): CHOL, HDL, LDLCALC, TRIG, CHOLHDL, LDLDIRECT in the last 72 hours. Thyroid Function Tests: No results for input(s): TSH, T4TOTAL, FREET4, T3FREE, THYROIDAB in the last 72 hours. Anemia Panel: No results for input(s): VITAMINB12, FOLATE, FERRITIN, TIBC, IRON, RETICCTPCT in the last 72 hours. Sepsis Labs: Recent Labs  Lab 04/12/21 0314 04/12/21 1043  LATICACIDVEN 1.2 1.9    Recent Results (from the past 240 hour(s))  Resp Panel by RT-PCR (Flu A&B, Covid) Nasopharyngeal Swab     Status: None   Collection Time: 04/12/21  9:26 AM   Specimen: Nasopharyngeal Swab; Nasopharyngeal(NP) swabs in vial transport medium  Result Value Ref Range Status   SARS Coronavirus 2 by RT PCR NEGATIVE NEGATIVE Final    Comment: (NOTE) SARS-CoV-2 target nucleic acids are NOT DETECTED.  The SARS-CoV-2 RNA is generally detectable in upper respiratory specimens during the acute phase of infection. The lowest concentration of SARS-CoV-2 viral  copies this assay can detect is 138 copies/mL. A negative result does not preclude SARS-Cov-2 infection and should not be used as the sole basis for treatment or other patient management decisions. A negative result may occur with  improper specimen collection/handling, submission of specimen other than nasopharyngeal swab, presence of viral mutation(s) within the areas targeted by this assay, and inadequate number of viral copies(<138 copies/mL). A negative result must be combined with clinical observations, patient history, and epidemiological information. The expected result is Negative.  Fact Sheet for Patients:  EntrepreneurPulse.com.au  Fact Sheet for Healthcare Providers:  IncredibleEmployment.be  This test is no t yet approved or cleared by the [place] FDA and  has been authorized for detection and/or diagnosis of SARS-CoV-2 by FDA under an Emergency Use Authorization (EUA). This EUA will remain  in effect (meaning this test can be used) for the duration of the COVID-19 declaration under Section 564(b)(1) of the Act, 21 U.S.C.section 360bbb-3(b)(1), unless the authorization is terminated  or revoked sooner.       Influenza A by PCR  NEGATIVE NEGATIVE Final   Influenza B by PCR NEGATIVE NEGATIVE Final    Comment: (NOTE) The Xpert Xpress SARS-CoV-2/FLU/RSV plus assay is intended as an aid in the diagnosis of influenza from Nasopharyngeal swab specimens and should not be used as a sole basis for treatment. Nasal washings and aspirates are unacceptable for Xpert Xpress SARS-CoV-2/FLU/RSV testing.  Fact Sheet for Patients: EntrepreneurPulse.com.au  Fact Sheet for Healthcare Providers: IncredibleEmployment.be  This test is not yet approved or cleared by the [place] FDA and has been authorized for detection and/or diagnosis of SARS-CoV-2 by FDA under an Emergency Use Authorization (EUA). This  EUA will remain in effect (meaning this test can be used) for the duration of the COVID-19 declaration under Section 564(b)(1) of the Act, 21 U.S.C. section 360bbb-3(b)(1), unless the authorization is terminated or revoked.  Performed at [facility], [place] [address] [place], [place] [address]          Radiology Studies: CT ABDOMEN PELVIS W CONTRAST  Result Date: 04/12/2021 CLINICAL DATA:  Abdominal pain.  Pancreatitis symptoms. EXAM: CT ABDOMEN AND PELVIS WITH CONTRAST TECHNIQUE: Multidetector CT imaging of the abdomen and pelvis was performed using the standard protocol following bolus administration of intravenous contrast. CONTRAST:  176m OMNIPAQUE IOHEXOL 300 MG/ML  SOLN COMPARISON:  Multiple prior CTs are present dating back to 2006. The most recent is 02/06/2021 FINDINGS: Lower chest: No acute abnormality. Cardiomegaly. 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 Hepatobiliary: No focal liver abnormality is seen. No gallstones, gallbladder wall thickening, or biliary dilatation. Pancreas: There is peripancreatic edema along side the head and neck segments, and increased prominence of the pancreatic duct in the interval. There are edematous changes in the pancreatic head but there is no appreciable mass enhancement in the pancreas. There is trace nonlocalizing peripancreatic fluid. No pancreatic hemorrhage, necrosis or abscess are seen. Spleen: Normal. Adrenals/Urinary Tract: There is no adrenal mass. There is mild adrenal hyperplasia. 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 There are no stones, hydronephrosis or mass enhancement. Bladder is not fully distended but could be thickened. Stomach/Bowel: There is moderate to severe gastric thickening which was seen  previously. There is no small bowel obstruction or inflammation. An appendix is not seen but no right lower quadrant inflammatory process appears to be present. There is fecal retention ascending and transverse colon, moderate amount. Left colonic diverticula without evidence of diverticulitis Vascular/Lymphatic: Aortic atherosclerosis. No enlarged abdominal or pelvic lymph nodes. There is extensive aortoiliac calcific plaque, relatively sparing the external iliacs. Reproductive: The uterus is absent.  There is no adnexal mass. Other: There is no pelvic ascites. There is no free air, hemorrhage or abscess. Musculoskeletal: Osteopenia and degenerative change of the spine, including with advanced L5-S1 disc collapse. No destructive bone lesions. IMPRESSION: 1. Evidence of acute pancreatitis involving the head and neck segments with increased pancreatic ductal dilatation and minimal nonlocalizing peripancreatic fluid. No pancreatic abscess or appreciable necrosis. No mass enhancement is seen. 2. Moderate to severe thickening in the gastric wall. This was seen previously. Endoscopic follow-up may be indicated but this is probably due to chronic gastritis. 3. Constipation. 4. Small kidneys with atrophy and numerous small cysts and too small to characterize hypodensities. 5. Cystitis versus bladder nondistention. Electronically Signed   By: KNinfa LindenD.  On: 04/12/2021 04:20        Scheduled Meds:  amLODipine  5 mg Oral Daily   atorvastatin  20 mg Oral QHS   Chlorhexidine Gluconate Cloth  6 each Topical Q0600   cinacalcet  60 mg Oral QPM   heparin  5,000 Units Subcutaneous Q8H   hydrALAZINE  75 mg Oral Q8H   mirtazapine  15 mg Oral QHS   sevelamer carbonate  800 mg Oral TID WC   sodium chloride flush  3 mL Intravenous Q12H   Continuous Infusions:  sodium chloride       LOS: 0 days    Time spent: 35 minutes.     Elmarie Shiley, MD [facility]   If 7PM-7AM, please contact  night-coverage [email or web address]  04/12/2021, 4:18 PM

## 2021-04-12 NOTE — ED Notes (Signed)
Provider at bedside

## 2021-04-12 NOTE — ED Notes (Signed)
New ekg exported.

## 2021-04-12 NOTE — Procedures (Signed)
   I was present at this dialysis session, have reviewed the session itself and made  appropriate changes Kelly Splinter MD [place] pager [phone]   04/12/2021, 1:01 PM

## 2021-04-12 NOTE — H&P (Signed)
History and Physical    Ashley [name] NGE:[id number] DOB: 1950-10-27 DOA: 04/11/2021  PCP: Raymon Mutton., FNP   Patient coming from: Home  Chief Complaint: Abdominal pain, nausea and vomiting  HPI: Ashley [name] is a 70 y.o. female with medical history significant for HTN, Pancreatitis, HLD, ESRD on HD who presents for evaluation of abdominal pain associate with nausea and vomiting.  Abdominal pain is in the epigastric region and she rates it as a 9 out of 10 at its worst.  She tried taking Tylenol at home but it did not help.  She states it feels similar to her previous episodes of pancreatitis.  Pain does not radiate.  She has not had good p.o. intake for the last 3 days secondary to pain with nausea and vomiting.  She denies having fevers or chills.  She denies any abdominal trauma.  States she has been taking her medications as prescribed although she does states she missed some of her blood pressure pills last night secondary to nausea vomiting and being emergency room for evaluation.  She was seen a couple days in the emergency room for similar complaints which improved after some pain medication she was sent home.  She reports the pain then recurred and has been persistent and more severe over the last 24 hours.  She denies any alcohol intake.  She continues to smoke cigarettes-smoking 1/4 pack a day.  She occasionally smokes marijuana she states.  ED Course: In the emergency room she has elevated blood pressure readings but is otherwise hemodynamically stable.  She is found have elevated lipase level and acute on chronic pancreatitis on CT scan with no pancreatic necrosis pseudocyst or abscess.  Lab work reveals an unremarkable CBC.  Sodium 133 potassium 5.6 chloride 89 bicarb 25 creatinine 15.01 increased from her baseline of 10-11 BUN 81 glucose 84 alk phosphatase 117 AST 14 ALT 8 lipase 153 albumin 4.0 total bilirubin 0.9 lactic acid 1.2.  Hospitalist service been asked to admit for  further management  Review of Systems:  General: Denies fever, weight loss, night sweats.  Denies dizziness. Denies change in appetite HENT: Denies head trauma, headache, denies change in hearing, tinnitus. Denies nasal bleeding. Denies sore throat, sores in mouth. Denies difficulty swallowing Eyes: Denies blurry vision, pain in eye, drainage. Denies discoloration of eyes. Neck: Denies pain.  Denies swelling.  Denies pain with movement. Cardiovascular: Denies chest pain, palpitations.  Denies edema.  Denies orthopnea Respiratory: Denies shortness of breath, cough.  Denies wheezing.  Denies sputum production Gastrointestinal: Reports abdominal pain, nausea, vomiting. Denies diarrhea.  Denies melena.  Denies hematemesis. Musculoskeletal: Denies limitation of movement.  Denies deformity or swelling.  Denies pain.  Denies arthralgias or myalgias. Genitourinary: Denies pelvic pain.  Denies urinary frequency or hesitancy.  Denies dysuria.  Skin: Denies rash.  Denies petechiae, purpura, ecchymosis. Neurological: Denies syncope. Denies seizure activity. Denies paresthesia. Denies slurred speech, drooping face.  Denies visual change. Psychiatric: Denies depression, anxiety. Denies hallucinations.  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  genotype 2B.  Epclusa started early 04/2020.     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  Chronic  microvascular ischemic changes w parenchymal volume loss. Chronic white matter periventricular microhemorrhage, likely due to htn   Ulcer 1990   gastric ulcer. Ruptured s/p emergency repair    Past Surgical History:  Procedure Laterality Date   ABDOMINAL HYSTERECTOMY  1979   AV FISTULA PLACEMENT Left 06/16/2017   Procedure: ARTERIOVENOUS (AV) FISTULA CREATION LEFT ARM;  Surgeon: Fransisco Hertz, MD;  Location: [facility] OR;  Service: Vascular;  Laterality: Left;   BASCILIC VEIN TRANSPOSITION Left 10/02/2017   Procedure: BASILIC VEIN TRANSPOSITION SECOND STAGE LEFT ARM;  Surgeon: Larina Earthly, MD;  Location: [facility] OR;  Service: Vascular;  Laterality: Left;   BIOPSY  09/06/2019   Procedure: BIOPSY;  Surgeon: Iva Boop, MD;  Location: [facility] ENDOSCOPY;  Service: Endoscopy;;   ESOPHAGOGASTRODUODENOSCOPY N/A 05/29/2013   Procedure: ESOPHAGOGASTRODUODENOSCOPY (EGD);  Surgeon: Beverley Fiedler, MD;  Location: [facility] ENDOSCOPY;  Service: Endoscopy;  Laterality: N/A;   ESOPHAGOGASTRODUODENOSCOPY  05/2013   for epigastric pain.  Nonobstructing Schatzki ring at GEJ, mild gastropathy, nonbleeding AVMs in bulb and D2. 5 mm sessile polyp in bulb.   ESOPHAGOGASTRODUODENOSCOPY (EGD) WITH PROPOFOL N/A 09/06/2019   Procedure: ESOPHAGOGASTRODUODENOSCOPY (EGD) WITH PROPOFOL;  Surgeon: Iva Boop, MD;  Location: [facility] ENDOSCOPY;  Service: Endoscopy;  Laterality: N/A;   ESOPHAGOGASTRODUODENOSCOPY (EGD) WITH PROPOFOL N/A 02/02/2020   Procedure: ESOPHAGOGASTRODUODENOSCOPY (EGD) WITH PROPOFOL;  Surgeon: Rachael Fee, MD;  Location: [facility] ENDOSCOPY;  Service: Endoscopy;  Laterality: N/A;   ESOPHAGOGASTRODUODENOSCOPY (EGD) WITH PROPOFOL N/A 02/07/2021   Procedure: ESOPHAGOGASTRODUODENOSCOPY (EGD) WITH PROPOFOL;  Surgeon: Rachael Fee, MD;  Location: [facility] ENDOSCOPY;  Service: Endoscopy;  Laterality: N/A;   EUS N/A 02/02/2020   Procedure: UPPER ENDOSCOPIC ULTRASOUND (EUS) RADIAL;  Surgeon: Rachael Fee, MD;  Location: [facility] ENDOSCOPY;   Service: Endoscopy;  Laterality: N/A;   EXCHANGE OF A DIALYSIS CATHETER Left 07/31/2017   Procedure: Removal  OF A  Right GroinTUNNELED  DIALYSIS CATHETER ,  Insertion of Left Femoral Dialysis Catheter.;  Surgeon: Larina Earthly, MD;  Location: [facility] OR;  Service: Vascular;  Laterality: Left;   HEMOSTASIS CLIP PLACEMENT  02/07/2021   Procedure: HEMOSTASIS CLIP PLACEMENT;  Surgeon: Rachael Fee, MD;  Location: [facility] ENDOSCOPY;  Service: Endoscopy;;   HOT HEMOSTASIS N/A 09/06/2019   Procedure: HOT HEMOSTASIS (ARGON PLASMA COAGULATION/BICAP);  Surgeon: Iva Boop, MD;  Location: [facility] ENDOSCOPY;  Service: Endoscopy;  Laterality: N/A;   HOT HEMOSTASIS N/A 02/07/2021   Procedure: HOT HEMOSTASIS (ARGON PLASMA COAGULATION/BICAP);  Surgeon: Rachael Fee, MD;  Location: [facility] ENDOSCOPY;  Service: Endoscopy;  Laterality: N/A;   INSERTION OF DIALYSIS CATHETER Right 06/16/2017   Procedure: INSERTION OF DIALYSIS CATHETER;  Surgeon: Fransisco Hertz, MD;  Location: [facility] OR;  Service: Vascular;  Laterality: Right;   IR AV DIALY SHUNT INTRO NEEDLE/INTRACATH INITIAL W/PTA/IMG LEFT  06/21/2018   REPAIR OF PERFORATED ULCER  1990   gastric ulcer    Social History  reports that she has been smoking cigarettes. She has a 10.00 pack-year smoking history. She has never used smokeless tobacco. She reports that she does not currently use drugs after having used the following drugs: Heroin, Marijuana, and Cocaine.  She reports that she does not drink alcohol.  Allergies  Allergen Reactions   Aspirin Nausea And Vomiting    Stomach ache   Ibuprofen Nausea And Vomiting    Stomach ache    Family History  Problem Relation Age of Onset   Hypertension Father    Cancer Father    Hyperlipidemia Father    Seizures Sister    Early death Daughter    Kidney disease Daughter        end stage dialysis dependent      Prior to Admission medications   Medication Sig Start Date End Date Taking? Authorizing Provider  acetaminophen  (TYLENOL) 325 MG tablet Take 2 tablets (650 mg total) by mouth every 6 (six) hours as needed for mild pain (or Fever >/= 101). 01/11/20   Azucena Fallen, MD  amLODipine (NORVASC) 10 MG tablet Take 1 tablet (10 mg total) by mouth daily. Patient taking differently: Take 10 mg by mouth daily at 12 noon. 11/11/16   Pincus Large, DO  atorvastatin (LIPITOR) 20 MG tablet Take 20 mg by mouth at bedtime. 08/05/20   [provider]  B Complex-C-Zn-Folic Acid (DIALYVITE 800 WITH ZINC) 0.8 MG TABS Take 1 tablet by mouth daily.  07/07/19   [provider]  cinacalcet (SENSIPAR) 30 MG tablet Take 30 mg by mouth at bedtime.     [provider]  cinacalcet (SENSIPAR) 60 MG tablet Take 60 mg by mouth every evening. 03/23/21   [provider]  cloNIDine (CATAPRES) 0.1 MG tablet Take 0.1 mg by mouth 3 (three) times daily. 06/25/19   [provider]  hydrALAZINE (APRESOLINE) 25 MG tablet Take 3 tablets (75 mg total) by mouth every 8 (eight) hours. 11/14/20 02/12/21  [place], Alvira Philips, DO  lidocaine-prilocaine (EMLA) cream Apply 1 application topically See admin instructions. Apply small amount to access site on Tuesday, Thursday, Saturday one hour before dialysis. Cover with occlusive dressing (saran wrap) 08/25/19   [provider]  methocarbamol (ROBAXIN) 500 MG tablet Take 1 tablet (500 mg total) by mouth 2 (two) times daily. 02/04/21   Nira Conn, MD  Methoxy PEG-Epoetin Beta (MIRCERA IJ) Administered at dialysis 09/24/20 09/23/21  [provider]  metoprolol succinate (TOPROL-XL) 25 MG 24 hr tablet Take 0.5 tablets (12.5 mg total) by mouth daily. 11/14/20 02/12/21  [place], Alvira Philips, DO  mirtazapine (REMERON) 15 MG tablet Take 15 mg by mouth at bedtime. 11/02/20   [provider]  Nutritional Supplements (FEEDING SUPPLEMENT, NEPRO CARB STEADY,) LIQD Take 237 mLs by mouth 3 (three) times daily as needed (Supplement). 07/11/19   Black, Lesle Chris, NP   ondansetron (ZOFRAN ODT) 4 MG disintegrating tablet Take 1 tablet (4 mg total) by mouth every 8 (eight) hours as needed for nausea or vomiting. 04/10/21   Pricilla Loveless, MD  oxyCODONE (ROXICODONE) 5 MG immediate release tablet Take 1 tablet (5 mg total) by mouth every 4 (four) hours as needed for severe pain. 04/10/21   Pricilla Loveless, MD  pantoprazole (PROTONIX) 40 MG tablet Take 1 tablet (40 mg total) by mouth daily. 11/14/20 02/12/21  [place], Alvira Philips, DO  polyethylene glycol (MIRALAX) 17 g packet Take 17 g by mouth 2 (two) times daily. Patient taking differently: Take 17 g by mouth daily as needed for moderate constipation. 09/14/19   Khatri, Hina, PA-C  predniSONE (DELTASONE) 20 MG tablet Take 3 tablets once daily for 3 days followed by 2 tablets once daily for 3 days followed  by 1 tablet once daily for 3 days and then stop 02/11/21   Osvaldo Shipper, MD  sevelamer carbonate (RENVELA) 800 MG tablet Take 800 mg by mouth 3 (three) times daily with meals. 07/23/20   [provider]    Physical Exam: Vitals:   04/12/21 0415 04/12/21 0430 04/12/21 0445 04/12/21 0500  BP: (!) 173/68 (!) 174/60 (!) 167/56 (!) 152/65  Pulse:  63    Resp: 12 14 12 12   Temp:      TempSrc:      SpO2:  100%    Weight:      Height:        Constitutional: NAD, calm, comfortable Vitals:   04/12/21 0415 04/12/21 0430 04/12/21 0445 04/12/21 0500  BP: (!) 173/68 (!) 174/60 (!) 167/56 (!) 152/65  Pulse:  63    Resp: 12 14 12 12   Temp:      TempSrc:      SpO2:  100%    Weight:      Height:       General: WDWN, Alert and oriented x3.  Eyes: EOMI, PERRL, conjunctivae normal.  Sclera nonicteric HENT:  [place]/AT, external ears normal.  Nares patent without epistasis.  Mucous membranes are dry. Posterior pharynx clear   Neck: Soft, normal range of motion, supple, no masses, Trachea midline Respiratory: clear to auscultation bilaterally, no wheezing, no crackles. Normal respiratory effort. No accessory muscle use.   Cardiovascular: Regular rate and rhythm, no murmurs / rubs / gallops. No extremity edema. 2+ pedal pulses. Abdomen: Soft, Epigastric tenderness, nondistended, no rebound or guarding.  No masses palpated. Bowel sounds normoactive Musculoskeletal: FROM. no cyanosis. No joint deformity upper and lower extremities. Normal muscle tone.  Skin: Warm, dry, intact no rashes, lesions, ulcers. No induration Neurologic: CN 2-12 grossly intact. Normal speech. Sensation intact. Strength 5/5 in all extremities.   Psychiatric: Normal judgment and insight.  Normal mood.    Labs on Admission: I have personally reviewed following labs and imaging studies  CBC: Recent Labs  Lab 04/10/21 0724 04/11/21 2059  WBC 8.0 7.6  HGB 13.4 12.6  HCT 41.5 38.5  MCV 93.3 91.7  PLT 250 239    Basic Metabolic Panel: Recent Labs  Lab 04/10/21 0724 04/11/21 2059  NA 136 133*  K 5.1 5.6*  CL 93* 89*  CO2 25 25  GLUCOSE 100* 84  BUN 69* 81*  CREATININE 11.61* 15.01*  CALCIUM 9.0 9.0    GFR: Estimated Creatinine Clearance: 2.7 mL/min (A) (by C-G formula based on SCr of 15.01 mg/dL (H)).  Liver Function Tests: Recent Labs  Lab 04/10/21 0724 04/11/21 2059  AST 15 14*  ALT 12 8  ALKPHOS 116 117  BILITOT 0.7 0.9  PROT 7.5 7.6  ALBUMIN 4.0 4.0    Urine analysis:    Component Value Date/Time   COLORURINE YELLOW 12/31/2018 1014   APPEARANCEUR CLEAR 12/31/2018 1014   LABSPEC 1.008 12/31/2018 1014   PHURINE 9.0 (H) 12/31/2018 1014   GLUCOSEU NEGATIVE 12/31/2018 1014   HGBUR SMALL (A) 12/31/2018 1014   BILIRUBINUR NEGATIVE 12/31/2018 1014   BILIRUBINUR NEG 01/29/2016 1142   KETONESUR NEGATIVE 12/31/2018 1014   PROTEINUR >=300 (A) 12/31/2018 1014   UROBILINOGEN 0.2 01/29/2016 1142   UROBILINOGEN 0.2 03/21/2015 1649   NITRITE NEGATIVE 12/31/2018 1014   LEUKOCYTESUR NEGATIVE 12/31/2018 1014    Radiological Exams on Admission: CT ABDOMEN PELVIS W CONTRAST  Result Date: 04/12/2021 CLINICAL DATA:   Abdominal pain.  Pancreatitis symptoms. EXAM: CT  ABDOMEN AND PELVIS WITH CONTRAST TECHNIQUE: Multidetector CT imaging of the abdomen and pelvis was performed using the standard protocol following bolus administration of intravenous contrast. CONTRAST:  OMNIPAQUE IOHEXOL 300 MG/ML  SOLN COMPARISON:  Multiple prior CTs are present dating back to 2006. The most recent is 02/06/2021 FINDINGS: Lower chest: No acute abnormality. Cardiomegaly. 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 Hepatobiliary: No focal liver abnormality is seen. No gallstones, gallbladder wall thickening, or biliary dilatation. Pancreas: There is peripancreatic edema along side the head and neck segments, and increased prominence of the pancreatic duct in the interval. There are edematous changes in the pancreatic head but there is no appreciable mass enhancement in the pancreas. There is trace nonlocalizing peripancreatic fluid. No pancreatic hemorrhage, necrosis or abscess are seen. Spleen: Normal. Adrenals/Urinary Tract: There is no adrenal mass. There is mild adrenal hyperplasia. 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 There are no stones, hydronephrosis or mass enhancement. Bladder is not fully distended but could be thickened. Stomach/Bowel: There is moderate to severe gastric thickening which was seen previously. There is no small bowel obstruction or inflammation. An appendix is not seen but no right lower quadrant inflammatory process appears to be present. There is fecal retention ascending and transverse colon, moderate amount. Left colonic diverticula without evidence of diverticulitis Vascular/Lymphatic: Aortic atherosclerosis. No enlarged abdominal or pelvic lymph nodes. There is extensive aortoiliac  calcific plaque, relatively sparing the external iliacs. Reproductive: The uterus is absent.  There is no adnexal mass. Other: There is no pelvic ascites. There is no free air, hemorrhage or abscess. Musculoskeletal: Osteopenia and degenerative change of the spine, including with advanced L5-S1 disc collapse. No destructive bone lesions. IMPRESSION: 1. Evidence of acute pancreatitis involving the head and neck segments with increased pancreatic ductal dilatation and minimal nonlocalizing peripancreatic fluid. No pancreatic abscess or appreciable necrosis. No mass enhancement is seen. 2. Moderate to severe thickening in the gastric wall. This was seen previously. Endoscopic follow-up may be indicated but this is probably due to chronic gastritis. 3. Constipation. 4. Small kidneys with atrophy and numerous small cysts and too small to characterize hypodensities. 5. Cystitis versus bladder nondistention. Electronically Signed   By: Almira Bar M.D.   On: 04/12/2021 04:20    Assessment/Plan Principal Problem:   Acute on chronic pancreatitis  Ms. Balbo is admitted to MedSurg floor.  She will be given sips of clear liquids as tolerated and diet will be slowly advanced as tolerated. Dilaudid for pain control ordered.  Pain will be reassessed by daytime attending and pain regimen adjusted as indicated Saline lock  Active Problems:   Hypertensive urgency Continue home medications of Norvasc, hydralazine, clonidine.  She missed her last dose of clonidine last night which may be contributing to her elevated blood pressure readings now.  Will be given dose of clonidine now.    Hyperkalemia Mildly elevated potassium at 5.6 on lab last night.  Labs to be redrawn this morning and potassium treat as indicated    ESRD on hemodialysis  Nephrology was consulted this morning to arrange for hemodialysis.  Patient missed her hemodialysis Wednesday secondary to abdominal pain and nausea vomiting   DVT  prophylaxis: Heparin for DVT prophylaxis.   Code Status:   Full Code  Family Communication:  Diagnosis and plan  discussed with patient.  She verbalized understanding and agrees with plan.  Further recommendations to follow as clinical indicated Disposition Plan:   Patient is from:  Home  Anticipated DC to:  Home   Anticipated DC date:  Anticipate 2 midnight or more stay in the hospital to treat acute condition  Consults called:  Nephrology consulted for hemodialysis  Admission status:  Inpatient  Claudean Severance Ovie Cornelio MD [facility]  How to contact the [facility] Attending or Consulting provider 7A - 7P or covering provider during after hours 7P -7A, for this patient?   Check the care team in [facility] and look for a) attending/consulting [facility] provider listed and b) the [facility] team listed Log into [email or web address] and use [place]'s universal password to access. If you do not have the password, please contact the hospital operator. Locate the [facility] provider you are looking for under [facility] and page to a number that you can be directly reached. If you still have difficulty reaching the provider, please page the [facility] (Director on Call) for the Hospitalists listed on amion for assistance.  04/12/2021, 5:04 AM

## 2021-04-12 NOTE — Plan of Care (Signed)
Pt will report pain level of 5/10 over next 24 hours

## 2021-04-12 NOTE — Consult Note (Signed)
Renal Service Consult Note [facility]  Ashley [name] 04/12/2021 Sol Blazing, MD Requesting Physician: Dr. Tyrell Antonio  Reason for Consult: ESRD w/ pancreatitis and bradycardia HPI: The patient is a 70 y.o. year-old w/ hx of ESRD on HD, gastropathy, pancreatitis, GERD, hep C, HL, HTN, CVA presented to ED w/ abd pain 9/10. In ED BP's were high initially, other VS were stable. Lipase was high and CT showed pancreatitis changes w/o abscess/ necrosis. K+ was 5.6. Pt was admitted. This morning her HR dropped into the low 40's. EKG showed sinus bradycardia and repeat K+ was up to 6.8. We are asked to see for dialysis.   Pt seen in ED. No CP or SOB, BP's wnl 125/ 68.  HR 38- 42, sinus by telemetry. No recent HD or access issues.   ROS - denies CP, no joint pain, no HA, no blurry vision, no rash  Past Medical History  Past Medical History:  Diagnosis Date   Acute pancreatitis 2000   2000, 12/2018, 08/2019   Arthritis    Cervical radiculopathy 02/28/2011   Cocaine substance abuse ([place]) 05/26/2013   positive UDS    Duodenitis    Erosive gastropathy    ESRD on hemodialysis (HCC)    TTS   GERD (gastroesophageal reflux disease)    Hepatitis C 1987   dt hx IVDA.  genotype 2B.  Epclusa started early 04/2020.     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  Chronic microvascular ischemic changes w parenchymal volume loss. Chronic white matter periventricular microhemorrhage, likely due to htn   Ulcer 1990   gastric ulcer. Ruptured s/p emergency repair   Past Surgical History  Past Surgical History:  Procedure Laterality Date   ABDOMINAL HYSTERECTOMY  1979   AV FISTULA PLACEMENT Left 06/16/2017   Procedure: ARTERIOVENOUS (AV)  FISTULA CREATION LEFT ARM;  Surgeon: Conrad La Palma, MD;  Location: [place];  Service: Vascular;  Laterality: Left;   [place] Left 10/02/2017   Procedure: BASILIC VEIN TRANSPOSITION SECOND STAGE LEFT ARM;  Surgeon: Rosetta Posner, MD;  Location: [place];  Service: Vascular;  Laterality: Left;   BIOPSY  09/06/2019   Procedure: BIOPSY;  Surgeon: Gatha Mayer, MD;  Location: [facility] ENDOSCOPY;  Service: Endoscopy;;   ESOPHAGOGASTRODUODENOSCOPY N/A 05/29/2013   Procedure: ESOPHAGOGASTRODUODENOSCOPY (EGD);  Surgeon: Jerene Bears, MD;  Location: [facility] ENDOSCOPY;  Service: Endoscopy;  Laterality: N/A;   ESOPHAGOGASTRODUODENOSCOPY  05/2013   for epigastric pain.  Nonobstructing Schatzki ring at GEJ, mild gastropathy, nonbleeding AVMs in bulb and D2. 5 mm sessile polyp in bulb.   ESOPHAGOGASTRODUODENOSCOPY (EGD) WITH PROPOFOL N/A 09/06/2019   Procedure: ESOPHAGOGASTRODUODENOSCOPY (EGD) WITH PROPOFOL;  Surgeon: Gatha Mayer, MD;  Location: [place];  Service: Endoscopy;  Laterality: N/A;   ESOPHAGOGASTRODUODENOSCOPY (EGD) WITH PROPOFOL N/A 02/02/2020   Procedure: ESOPHAGOGASTRODUODENOSCOPY (EGD) WITH PROPOFOL;  Surgeon: [name], MD;  Location: [facility] ENDOSCOPY;  Service: Endoscopy;  Laterality: N/A;   ESOPHAGOGASTRODUODENOSCOPY (EGD) WITH PROPOFOL N/A 02/07/2021   Procedure: ESOPHAGOGASTRODUODENOSCOPY (EGD) WITH PROPOFOL;  Surgeon: [name], MD;  Location: [facility] ENDOSCOPY;  Service: Endoscopy;  Laterality: N/A;   EUS N/A 02/02/2020   Procedure: UPPER ENDOSCOPIC ULTRASOUND (EUS) RADIAL;  Surgeon: [name], MD;  Location: [facility] ENDOSCOPY;  Service: Endoscopy;  Laterality: N/A;   EXCHANGE OF A DIALYSIS CATHETER Left 07/31/2017   Procedure: Removal  OF A  Right GroinTUNNELED  DIALYSIS CATHETER ,  Insertion of Left Femoral Dialysis Catheter.;  Surgeon: Rosetta Posner, MD;  Location: [place];  Service: Vascular;  Laterality: Left;   HEMOSTASIS CLIP PLACEMENT  02/07/2021   Procedure: HEMOSTASIS  CLIP PLACEMENT;  Surgeon: [name], MD;  Location: [place];  Service: Endoscopy;;   HOT HEMOSTASIS N/A 09/06/2019   Procedure: HOT HEMOSTASIS (ARGON PLASMA COAGULATION/BICAP);  Surgeon: Gatha Mayer, MD;  Location: [facility] ENDOSCOPY;  Service: Endoscopy;  Laterality: N/A;   HOT HEMOSTASIS N/A 02/07/2021   Procedure: HOT HEMOSTASIS (ARGON PLASMA COAGULATION/BICAP);  Surgeon: [name], MD;  Location: [facility] ENDOSCOPY;  Service: Endoscopy;  Laterality: N/A;   INSERTION OF DIALYSIS CATHETER Right 06/16/2017   Procedure: INSERTION OF DIALYSIS CATHETER;  Surgeon: Conrad Shepher[place]own, MD;  Location: [facility] OR;  Service: Vascular;  Laterality: Right;   IR AV DIALY SHUNT INTRO NEEDLE/INTRACATH INITIAL W/PTA/IMG LEFT  06/21/2018   REPAIR OF PERFORATED ULCER  1990   gastric ulcer   Family History  Family History  Problem Relation Age of Onset   Hypertension Father    Cancer Father    Hyperlipidemia Father    Seizures Sister    Early death Daughter    Kidney disease Daughter        end stage dialysis dependent    Social History  reports that she has been smoking cigarettes. She has a 10.00 pack-year smoking history. She has never used smokeless tobacco. She reports that she does not currently use drugs after having used the following drugs: Heroin, Marijuana, and Cocaine. She reports that she does not drink alcohol. Allergies  Allergies  Allergen Reactions   Aspirin Nausea And Vomiting    Stomach ache   Ibuprofen Nausea And Vomiting    Stomach ache   Home medications Prior to Admission medications   Medication Sig Start Date End Date Taking? Authorizing Provider  acetaminophen (TYLENOL) 325 MG tablet Take 2 tablets (650 mg total) by mouth every 6 (six) hours as needed for mild pain (or Fever >/= 101). 01/11/20   Little Ishikawa, MD  amLODipine (NORVASC) 10 MG tablet Take 1 tablet (10 mg total) by mouth daily. Patient taking differently: Take 10 mg by mouth daily at 12 noon. 11/11/16   Katheren Shams, DO  atorvastatin (LIPITOR) 20 MG tablet Take 20 mg by mouth at bedtime. 08/05/20   [provider]  B Complex-C-Zn-Folic Acid (DIALYVITE 387 WITH ZINC) 0.8 MG TABS Take 1 tablet by mouth daily.  07/07/19   [provider]  cinacalcet (SENSIPAR) 30 MG tablet Take 30 mg by mouth at bedtime.     [provider]  cinacalcet (SENSIPAR) 60 MG tablet Take 60 mg by mouth every evening. 03/23/21   [provider]  cloNIDine (CATAPRES) 0.1 MG tablet Take 0.1 mg by mouth 3 (three) times daily. 06/25/19   [provider]  hydrALAZINE (APRESOLINE) 25 MG tablet Take 3 tablets (75 mg total) by mouth every 8 (eight) hours. 11/14/20 02/12/21  [place] Donnamarie Poag, DO  lidocaine-prilocaine (EMLA) cream Apply 1 application topically See admin instructions. Apply small amount to access site on Tuesday, Thursday, Saturday one hour before dialysis. Cover with occlusive dressing (saran wrap) 08/25/19   [provider]  methocarbamol (ROBAXIN) 500 MG tablet Take  1 tablet (500 mg total) by mouth 2 (two) times daily. 02/04/21   Fatima Blank, MD  Methoxy PEG-Epoetin Beta (MIRCERA IJ) Administered at dialysis 09/24/20 09/23/21  [provider]  metoprolol succinate (TOPROL-XL) 25 MG 24 hr tablet Take 0.5 tablets (12.5 mg total) by mouth daily. 11/14/20 02/12/21  [place] Donnamarie Poag, DO  mirtazapine (REMERON) 15 MG tablet Take 15 mg by mouth at bedtime. 11/02/20   [provider]  Nutritional Supplements (FEEDING SUPPLEMENT, NEPRO CARB STEADY,) LIQD Take 237 mLs by mouth 3 (three) times daily as needed (Supplement). 07/11/19   Black, Lezlie Octave, NP  ondansetron (ZOFRAN ODT) 4 MG disintegrating tablet Take 1 tablet (4 mg total) by mouth every 8 (eight) hours as needed for nausea or vomiting. 04/10/21   Sherwood Gambler, MD  oxyCODONE (ROXICODONE) 5 MG immediate release tablet Take 1 tablet (5 mg total) by mouth every 4 (four) hours as needed for severe pain. 04/10/21    Sherwood Gambler, MD  pantoprazole (PROTONIX) 40 MG tablet Take 1 tablet (40 mg total) by mouth daily. 11/14/20 02/12/21  [place] Donnamarie Poag, DO  polyethylene glycol (MIRALAX) 17 g packet Take 17 g by mouth 2 (two) times daily. Patient taking differently: Take 17 g by mouth daily as needed for moderate constipation. 09/14/19   Khatri, Hina, PA-C  predniSONE (DELTASONE) 20 MG tablet Take 3 tablets once daily for 3 days followed by 2 tablets once daily for 3 days followed by 1 tablet once daily for 3 days and then stop 02/11/21   Bonnielee Haff, MD  sevelamer carbonate (RENVELA) 800 MG tablet Take 800 mg by mouth 3 (three) times daily with meals. 07/23/20   [provider]     Vitals:   04/12/21 0800 04/12/21 0900 04/12/21 0909 04/12/21 1000  BP: (!) 142/58 (!) 115/50 (!) 115/50 (!) 116/52  Pulse: (!) 41 (!) 41 (!) 42 (!) 36  Resp: 11 10 14 10   Temp:      TempSrc:      SpO2: 97% 91% 95% 98%  Weight:      Height:       Exam Gen alert, no distress No rash, cyanosis or gangrene Sclera anicteric, throat clear  No jvd or bruits Chest clear bilat to bases, no rales/ wheezing RRR no MRG Abd soft , mild epig tenderness, no mass or ascites +bs GU defer MS no joint effusions or deformity Ext no LE or UE edema, no wounds or ulcers Neuro is alert, Ox 3 , nf  LUA AVF+bruit         Home meds include - norvasc 10, lipitor , clonidine 0.1 tid, sensipar, hydralazine 75 tid, remeron , renvela 1 ac tid     OP HD: MWF [place]   3.5h  51kg   2/2 bath  P4  AVF  Heparin none  - no esa or vdra   Assessment/ Plan: Hyperkalemia / sinus bradycardia - getting temporizing measures in the ED for HR in the low 40's. BP ok and pt is not symptomatic. Bradycardia likely due to [place] as it happened acutely this am in concert w/ new high K+ level. Will have HD upstairs shortly.  Abd pain / acute pancreatitis - hx of same in the past. CT+ changes. Per pmd ESRD - on HD MWF. HD today as above.  HTN/ volume - BP's  wnl, no vol excess on exam. Holding clonidine for low HR. Cont other meds.  Anemia ckd - Hb 11, no esa needs MBD  ckd - cont binder/ sensipar, Ca in range      Kelly Splinter  MD 04/12/2021, 10:06 AM  Recent Labs  Lab 04/11/21 2059 04/12/21 0840  WBC 7.6 7.4  HGB 12.6 11.1*   Recent Labs  Lab 04/11/21 2059 04/12/21 0840  K 5.6* 6.8*  BUN 81* 88*  CREATININE 15.01* 16.32*  CALCIUM 9.0 8.5*

## 2021-04-13 LAB — HEPATITIS B SURFACE ANTIBODY, QUANTITATIVE: Hep B S AB Quant (Post): 72 m[IU]/mL (ref >9.9)

## 2021-04-13 LAB — PHOSPHORUS: Phosphorus: 4.8 mg/dL — ABNORMAL HIGH (ref 2.5–4.6)

## 2021-04-13 LAB — POTASSIUM
Potassium: 4.8 mmol/L (ref 3.5–5.1)
Potassium: 5.2 mmol/L — ABNORMAL HIGH (ref 3.5–5.1)

## 2021-04-13 MED ORDER — SODIUM ZIRCONIUM CYCLOSILICATE 10 G PO PACK
10.0000 g | PACK | Freq: Three times a day (TID) | ORAL | Status: AC
  Administered 2021-04-13 (×2): 10 g via ORAL
  Filled 2021-04-13 (×3): qty 1

## 2021-04-13 NOTE — Progress Notes (Signed)
[facility] Progress Note   Subjective:  Had urgent dialysis yesterday net UF 2L  Seen in room. No new complaints, abd pain improving. No cp, sob, n/v.   Objective Vitals:   04/12/21 2232 04/13/21 0000 04/13/21 0400 04/13/21 0800  BP: 127/68 (!) 125/57 127/77 121/71  Pulse: 80 88 84 87  Resp: 15 17 13 11   Temp:  99.6 F (37.6 C) 99 F (37.2 C) 99.5 F (37.5 C)  TempSrc:  Oral Oral Oral  SpO2: 100% 100% 94% 94%  Weight:      Height:         Additional Objective Labs: Basic Metabolic Panel: Recent Labs  Lab 04/10/21 0724 04/11/21 2059 04/12/21 0840 04/12/21 1043 04/12/21 2150 04/13/21 0143 04/13/21 0615  NA 136 133* 130*  --   --   --   --   K 5.1 5.6* 6.8*   < > 4.5 4.8 5.2*  CL 93* 89* 89*  --   --   --   --   CO2 25 25 26   --   --   --   --   GLUCOSE 100* 84 90  --   --   --   --   BUN 69* 81* 88*  --   --   --   --   CREATININE 11.61* 15.01* 16.32*  --   --   --   --   CALCIUM 9.0 9.0 8.5*  --   --   --   --    < > = values in this interval not displayed.   CBC: Recent Labs  Lab 04/10/21 0724 04/11/21 2059 04/12/21 0840  WBC 8.0 7.6 7.4  HGB 13.4 12.6 11.1*  HCT 41.5 38.5 34.2*  MCV 93.3 91.7 91.9  PLT 250 239 219   Blood Culture    Component Value Date/Time   SDES URINE, RANDOM 06/20/2017 2222   SPECREQUEST NONE 06/20/2017 2222   CULT MULTIPLE SPECIES PRESENT, SUGGEST RECOLLECTION (A) 06/20/2017 2222   REPTSTATUS 06/22/2017 FINAL 06/20/2017 2222     Physical Exam General: Alert non-toxic appearing, nad  Heart: RRR No m,r,g Lungs: Clear bilaterally  Abdomen: soft, non-distended, mild epigastric tenderness Extremities: No LE edema  Dialysis Access: LUE AVF +bruit   Medications:  sodium chloride      amLODipine  5 mg Oral Daily   atorvastatin  20 mg Oral QHS   Chlorhexidine Gluconate Cloth  6 each Topical Q0600   cinacalcet  60 mg Oral QPM   heparin  5,000 Units Subcutaneous Q8H   hydrALAZINE  75 mg Oral Q8H    mirtazapine  15 mg Oral QHS   sevelamer carbonate  800 mg Oral TID WC   sodium chloride flush  3 mL Intravenous Q12H   sodium zirconium cyclosilicate  10 g Oral TID   Home meds include - norvasc 10, lipitor , clonidine 0.1 tid, sensipar, hydralazine 75 tid, remeron , renvela 1 ac tid    Dialysis Orders: MWF [place]   3.5h  51kg   2/2 bath  P4  AVF  Heparin none  - no esa or vdra  Assessment/Plan: Hyperkalemia / sinus bradycardia - Bradycardia likely due to [place] S/p temporizing measures and HD 11/4.  Resolved. HR 90s. K+ creeping up this am. (Just put on renal diet this am) On [place] 10g TID. Abd pain / acute pancreatitis - hx of same in the past. CT+ changes. Per pmd ESRD - on HD MWF. Had HD Friday. Next  HD 11/7 HTN/ volume - BP's wnl, no vol excess on exam. Holding clonidine for low HR. Cont other meds.  Anemia ckd - Hb 11, no esa needs MBD ckd - cont binder/ sensipar, Ca in range  Lynnda Child PA-C [facility] 04/13/2021,10:04 AM

## 2021-04-13 NOTE — Progress Notes (Signed)
PROGRESS NOTE    Ashley [name]  CMK:[id number] DOB: 1951/03/07 DOA: 04/11/2021 PCP: Sonia Side., FNP   Brief Narrative: 70 year old with past medical history significant for hypertension, pancreatitis, HLD, ESRD on hemodialysis who presents complaining of abdominal pain, nausea and vomiting.  She reports epigastric pain 9 out of 10.  She reports similar episodes previously related to pancreatitis. Evaluation in the ED she was found to have high blood pressure, CT scan show acute on chronic pancreatitis no pancreatic necrosis.  Potassium 5.6, creatinine 15, AST 14, ALT 8, lipase 153.  Patient subsequently developed bradycardia with heart rate in the 40s, repeated B med potassium 6.8.  Insulin, glucose, calcium gluconate, albuterol and bicarb order. Nephrology consulted patient had urgent HD 11/04 .     Assessment & Plan:   Principal Problem:   Acute on chronic pancreatitis (HCC) Active Problems:   ESRD on hemodialysis (HCC)   Pancreatitis   Hyperkalemia   Hypertensive urgency  1-Bradycardia; Resolved, likely related to hyperkalemia.  She was  alert and conversant, denies chest pain, dyspnea, dizziness. EKG atrial bradycardia.  -Repeated B-met  show potassium was 6.8. -Renal consulted for HD>  -Received Insulin, glucose, albuterol and bicarb ordered.  -had urgent HD 11/04. Resolved.   2-Acute Pancreatitis.  Reports abdominal pain has improved.  Plan to continue with clear diet.  Pain management. Recent ultrasound 01/2021 negative for gallstone. Still having abdominal pain, plan to continue with clear diet.   3-ESRD; hemodialysis:  Nephrology following.   4-Hyperkalemia;  Likely related to ESRD Insulin, glucose albuterol bicarb ordered.   Had HD 11/04. Appreciate nephrology assistance.   HTN Urgency:  Continue with hydralazine,  Norvasc. BP in the 120, if rebound will resume clonidine.   Estimated body mass index is 21.4 kg/m as calculated from the following:    Height as of this encounter: '5\' 2"'  (1.575 m).   Weight as of this encounter: 53.1 kg.   DVT prophylaxis: Heparin Code Status: Full code Family Communication: Care discussed with patient Disposition Plan:  Status is: Inpatient  Remains inpatient appropriate because: Patient presented with bradycardia, hyperkalemia, acute pancreatitis        Consultants:  Nephrology  Procedures:  Hemodialysis  Antimicrobials:    Subjective: She is still having abdominal pain 7/10 She has not had BM.     Objective: Vitals:   04/13/21 0000 04/13/21 0400 04/13/21 0800 04/13/21 1145  BP: (!) 125/57 127/77 121/71 122/76  Pulse: 88 84 87 85  Resp: '17 13 11 18  ' Temp: 99.6 F (37.6 C) 99 F (37.2 C) 99.5 F (37.5 C) 97.6 F (36.4 C)  TempSrc: Oral Oral Oral Oral  SpO2: 100% 94% 94% 94%  Weight:      Height:        Intake/Output Summary (Last 24 hours) at 04/13/2021 1224 Last data filed at 04/12/2021 2240 Gross per 24 hour  Intake 3 ml  Output 2000 ml  Net -1997 ml    Filed Weights   04/11/21 2054  Weight: 53.1 kg    Examination:  General exam: NAD Respiratory system: CTA Cardiovascular system: S 1, S 2 RRR Gastrointestinal system: BS present, soft, nt Central nervous system: Alert Extremities: Symmetric power   Data Reviewed: I have personally reviewed following labs and imaging studies  CBC: Recent Labs  Lab 04/10/21 0724 04/11/21 2059 04/12/21 0840  WBC 8.0 7.6 7.4  HGB 13.4 12.6 11.1*  HCT 41.5 38.5 34.2*  MCV 93.3 91.7 91.9  PLT 250 239  161    Basic Metabolic Panel: Recent Labs  Lab 04/10/21 0724 04/11/21 2059 04/12/21 0840 04/12/21 1043 04/12/21 1836 04/12/21 2150 04/13/21 0143 04/13/21 0615  NA 136 133* 130*  --   --   --   --   --   K 5.1 5.6* 6.8* 5.7* 4.0 4.5 4.8 5.2*  CL 93* 89* 89*  --   --   --   --   --   CO2 '25 25 26  ' --   --   --   --   --   GLUCOSE 100* 84 90  --   --   --   --   --   BUN 69* 81* 88*  --   --   --   --   --    CREATININE 11.61* 15.01* 16.32*  --   --   --   --   --   CALCIUM 9.0 9.0 8.5*  --   --   --   --   --     GFR: Estimated Creatinine Clearance: 2.5 mL/min (A) (by C-G formula based on SCr of 16.32 mg/dL (H)). Liver Function Tests: Recent Labs  Lab 04/10/21 0724 04/11/21 2059  AST 15 14*  ALT 12 8  ALKPHOS 116 117  BILITOT 0.7 0.9  PROT 7.5 7.6  ALBUMIN 4.0 4.0    Recent Labs  Lab 04/10/21 0724 04/11/21 2059  LIPASE 274* 153*    No results for input(s): AMMONIA in the last 168 hours. Coagulation Profile: No results for input(s): INR, PROTIME in the last 168 hours. Cardiac Enzymes: No results for input(s): CKTOTAL, CKMB, CKMBINDEX, TROPONINI in the last 168 hours. BNP (last 3 results) No results for input(s): PROBNP in the last 8760 hours. HbA1C: No results for input(s): HGBA1C in the last 72 hours. CBG: No results for input(s): GLUCAP in the last 168 hours. Lipid Profile: No results for input(s): CHOL, HDL, LDLCALC, TRIG, CHOLHDL, LDLDIRECT in the last 72 hours. Thyroid Function Tests: No results for input(s): TSH, T4TOTAL, FREET4, T3FREE, THYROIDAB in the last 72 hours. Anemia Panel: No results for input(s): VITAMINB12, FOLATE, FERRITIN, TIBC, IRON, RETICCTPCT in the last 72 hours. Sepsis Labs: Recent Labs  Lab 04/12/21 0314 04/12/21 1043  LATICACIDVEN 1.2 1.9     Recent Results (from the past 240 hour(s))  Resp Panel by RT-PCR (Flu A&B, Covid) Nasopharyngeal Swab     Status: None   Collection Time: 04/12/21  9:26 AM   Specimen: Nasopharyngeal Swab; Nasopharyngeal(NP) swabs in vial transport medium  Result Value Ref Range Status   SARS Coronavirus 2 by RT PCR NEGATIVE NEGATIVE Final    Comment: (NOTE) SARS-CoV-2 target nucleic acids are NOT DETECTED.  The SARS-CoV-2 RNA is generally detectable in upper respiratory specimens during the acute phase of infection. The lowest concentration of SARS-CoV-2 viral copies this assay can detect is 138 copies/mL.  A negative result does not preclude SARS-Cov-2 infection and should not be used as the sole basis for treatment or other patient management decisions. A negative result may occur with  improper specimen collection/handling, submission of specimen other than nasopharyngeal swab, presence of viral mutation(s) within the areas targeted by this assay, and inadequate number of viral copies(<138 copies/mL). A negative result must be combined with clinical observations, patient history, and epidemiological information. The expected result is Negative.  Fact Sheet for Patients:  EntrepreneurPulse.com.au  Fact Sheet for Healthcare Providers:  IncredibleEmployment.be  This test is no t yet approved or cleared  by the [place] and  has been authorized for detection and/or diagnosis of SARS-CoV-2 by FDA under an Emergency Use Authorization (EUA). This EUA will remain  in effect (meaning this test can be used) for the duration of the COVID-19 declaration under Section 564(b)(1) of the Act, 21 U.S.C.section 360bbb-3(b)(1), unless the authorization is terminated  or revoked sooner.       Influenza A by PCR NEGATIVE NEGATIVE Final   Influenza B by PCR NEGATIVE NEGATIVE Final    Comment: (NOTE) The Xpert Xpress SARS-CoV-2/FLU/RSV plus assay is intended as an aid in the diagnosis of influenza from Nasopharyngeal swab specimens and should not be used as a sole basis for treatment. Nasal washings and aspirates are unacceptable for Xpert Xpress SARS-CoV-2/FLU/RSV testing.  Fact Sheet for Patients: EntrepreneurPulse.com.au  Fact Sheet for Healthcare Providers: IncredibleEmployment.be  This test is not yet approved or cleared by the [place] FDA and has been authorized for detection and/or diagnosis of SARS-CoV-2 by FDA under an Emergency Use Authorization (EUA). This EUA will remain in effect (meaning this test  can be used) for the duration of the COVID-19 declaration under Section 564(b)(1) of the Act, 21 U.S.C. section 360bbb-3(b)(1), unless the authorization is terminated or revoked.  Performed at [facility], [place] [address] [place], [place] [address]           Radiology Studies: CT ABDOMEN PELVIS W CONTRAST  Result Date: 04/12/2021 CLINICAL DATA:  Abdominal pain.  Pancreatitis symptoms. EXAM: CT ABDOMEN AND PELVIS WITH CONTRAST TECHNIQUE: Multidetector CT imaging of the abdomen and pelvis was performed using the standard protocol following bolus administration of intravenous contrast. CONTRAST:  179m OMNIPAQUE IOHEXOL 300 MG/ML  SOLN COMPARISON:  Multiple prior CTs are present dating back to 2006. The most recent is 02/06/2021 FINDINGS: Lower chest: No acute abnormality. Cardiomegaly. 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 Hepatobiliary: No focal liver abnormality is seen. No gallstones, gallbladder wall thickening, or biliary dilatation. Pancreas: There is peripancreatic edema along side the head and neck segments, and increased prominence of the pancreatic duct in the interval. There are edematous changes in the pancreatic head but there is no appreciable mass enhancement in the pancreas. There is trace nonlocalizing peripancreatic fluid. No pancreatic hemorrhage, necrosis or abscess are seen. Spleen: Normal. Adrenals/Urinary Tract: There is no adrenal mass. There is mild adrenal hyperplasia. 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 There are no stones, hydronephrosis or mass enhancement. Bladder is not fully distended but could be thickened. Stomach/Bowel: There is moderate to severe gastric thickening which was seen previously. There is no small bowel obstruction  or inflammation. An appendix is not seen but no right lower quadrant inflammatory process appears to be present. There is fecal retention ascending and transverse colon, moderate amount. Left colonic diverticula without evidence of diverticulitis Vascular/Lymphatic: Aortic atherosclerosis. No enlarged abdominal or pelvic lymph nodes. There is extensive aortoiliac calcific plaque, relatively sparing the external iliacs. Reproductive: The uterus is absent.  There is no adnexal mass. Other: There is no pelvic ascites. There is no free air, hemorrhage or abscess. Musculoskeletal: Osteopenia and degenerative change of the spine, including with advanced L5-S1 disc collapse. No destructive bone lesions. IMPRESSION: 1. Evidence of acute pancreatitis involving the head and neck segments with increased pancreatic ductal dilatation and  minimal nonlocalizing peripancreatic fluid. No pancreatic abscess or appreciable necrosis. No mass enhancement is seen. 2. Moderate to severe thickening in the gastric wall. This was seen previously. Endoscopic follow-up may be indicated but this is probably due to chronic gastritis. 3. Constipation. 4. Small kidneys with atrophy and numerous small cysts and too small to characterize hypodensities. 5. Cystitis versus bladder nondistention. Electronically Signed   By: Telford Nab M.D.   On: 04/12/2021 04:20        Scheduled Meds:  amLODipine  5 mg Oral Daily   atorvastatin  20 mg Oral QHS   Chlorhexidine Gluconate Cloth  6 each Topical Q0600   cinacalcet  60 mg Oral QPM   heparin  5,000 Units Subcutaneous Q8H   hydrALAZINE  75 mg Oral Q8H   mirtazapine  15 mg Oral QHS   sevelamer carbonate  800 mg Oral TID WC   sodium chloride flush  3 mL Intravenous Q12H   sodium zirconium cyclosilicate  10 g Oral TID   Continuous Infusions:  sodium chloride       LOS: 1 day    Time spent: 35 minutes.     Elmarie Shiley, MD [facility]   If 7PM-7AM, please contact  night-coverage [email or web address]  04/13/2021, 12:24 PM

## 2021-04-13 NOTE — Progress Notes (Deleted)
[facility] Progress Note  Subjective: seen in room, not feeling well, abd pain  Vitals:   04/12/21 2232 04/13/21 0000 04/13/21 0400 04/13/21 0800  BP: 127/68 (!) 125/57 127/77 121/71  Pulse: 80 88 84 87  Resp: 15 17 13 11   Temp:  99.6 F (37.6 C) 99 F (37.2 C) 99.5 F (37.5 C)  TempSrc:  Oral Oral Oral  SpO2: 100% 100% 94% 94%  Weight:      Height:        Exam:  alert, nad   no jvd  Chest cta bilat  Cor reg no RG  Abd soft ntnd no ascites   Ext no LE edema   Alert, NF, ox3  LUA aVF+bruit    Home meds include - norvasc 10, lipitor , clonidine 0.1 tid, sensipar, hydralazine 75 tid, remeron , renvela 1 ac tid       OP HD: MWF [place]   3.5h  51kg   2/2 bath  P4  AVF  Heparin none  - no esa or vdra     Assessment/ Plan: Hyperkalemia / sinus bradycardia - resolved w/ HD. HR back up in the 80's. K+ back up low 5's this am, changed her diet to renal diet w/ clear liquids. Lokelma x 2.  Abd pain / acute pancreatitis - hx of same in the past. CT+ changes. Per pmd ESRD - on HD MWF. Next HD Monday.  HTN/ volume - BP's wnl, no vol excess on exam. Cont home meds.  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   Inpatient medications:  amLODipine  5 mg Oral Daily   atorvastatin  20 mg Oral QHS   Chlorhexidine Gluconate Cloth  6 each Topical Q0600   cinacalcet  60 mg Oral QPM   heparin  5,000 Units Subcutaneous Q8H   hydrALAZINE  75 mg Oral Q8H   mirtazapine  15 mg Oral QHS   sevelamer carbonate  800 mg Oral TID WC   sodium chloride flush  3 mL Intravenous Q12H   sodium  zirconium cyclosilicate  10 g Oral TID    sodium chloride     sodium chloride, acetaminophen **OR** acetaminophen, HYDROmorphone (DILAUDID) injection, ondansetron **OR** ondansetron (ZOFRAN) IV, oxyCODONE, senna-docusate, sodium chloride flush

## 2021-04-14 LAB — BASIC METABOLIC PANEL
Anion gap: 17 — ABNORMAL HIGH (ref 5–15)
BUN: 44 mg/dL — ABNORMAL HIGH (ref 8–23)
CO2: 25 mmol/L (ref 22–32)
Calcium: 8.3 mg/dL — ABNORMAL LOW (ref 8.9–10.3)
Chloride: 90 mmol/L — ABNORMAL LOW (ref 98–111)
Creatinine, Ser: 13.72 mg/dL — ABNORMAL HIGH (ref 0.44–1.00)
GFR, Estimated: 3 mL/min — ABNORMAL LOW (ref >60)
Glucose, Bld: 108 mg/dL — ABNORMAL HIGH (ref 70–99)
Potassium: 4.5 mmol/L (ref 3.5–5.1)
Sodium: 132 mmol/L — ABNORMAL LOW (ref 135–145)

## 2021-04-14 LAB — TRIGLYCERIDES: Triglycerides: 108 mg/dL (ref <150)

## 2021-04-14 MED ORDER — PANTOPRAZOLE SODIUM 40 MG PO TBEC
40.0000 mg | DELAYED_RELEASE_TABLET | Freq: Every day | ORAL | Status: DC
  Administered 2021-04-14 – 2021-04-16 (×3): 40 mg via ORAL
  Filled 2021-04-14 (×3): qty 1

## 2021-04-14 MED ORDER — KETOROLAC TROMETHAMINE 15 MG/ML IJ SOLN
15.0000 mg | Freq: Once | INTRAMUSCULAR | Status: AC
  Administered 2021-04-15: 15 mg via INTRAVENOUS
  Filled 2021-04-14: qty 1

## 2021-04-14 NOTE — Progress Notes (Signed)
[facility] Progress Note   Subjective:  Seen in room. Had more abdominal discomfort overnight so back on clear liquids She was concerned about her fistula "not looking right"   Objective Vitals:   04/13/21 1934 04/13/21 2338 04/14/21 0359 04/14/21 0805  BP: (!) 149/99 139/85 (!) 154/75 (!) 152/75  Pulse: 96 94 91 97  Resp: 17 17 16 14   Temp: 98.3 F (36.8 C) 99.4 F (37.4 C) 98.9 F (37.2 C) 98.1 F (36.7 C)  TempSrc: Axillary Oral Oral Oral  SpO2: 91% 93% 91% 92%  Weight:   55.1 kg   Height:         Additional Objective Labs: Basic Metabolic Panel: Recent Labs  Lab 04/10/21 0724 04/11/21 2059 04/12/21 0840 04/12/21 1043 04/12/21 2150 04/13/21 0143 04/13/21 0615  NA 136 133* 130*  --   --   --   --   K 5.1 5.6* 6.8*   < > 4.5 4.8 5.2*  CL 93* 89* 89*  --   --   --   --   CO2 25 25 26   --   --   --   --   GLUCOSE 100* 84 90  --   --   --   --   BUN 69* 81* 88*  --   --   --   --   CREATININE 11.61* 15.01* 16.32*  --   --   --   --   CALCIUM 9.0 9.0 8.5*  --   --   --   --   PHOS  --   --   --   --   --   --  4.8*   < > = values in this interval not displayed.    CBC: Recent Labs  Lab 04/10/21 0724 04/11/21 2059 04/12/21 0840  WBC 8.0 7.6 7.4  HGB 13.4 12.6 11.1*  HCT 41.5 38.5 34.2*  MCV 93.3 91.7 91.9  PLT 250 239 219    Blood Culture    Component Value Date/Time   SDES URINE, RANDOM 06/20/2017 2222   SPECREQUEST NONE 06/20/2017 2222   CULT MULTIPLE SPECIES PRESENT, SUGGEST RECOLLECTION (A) 06/20/2017 2222   REPTSTATUS 06/22/2017 FINAL 06/20/2017 2222     Physical Exam General: Alert non-toxic appearing, nad  Heart: RRR No m,r,g Lungs: Clear bilaterally  Abdomen: soft, non-distended, mild epigastric tenderness Extremities: No LE edema  Dialysis Access: LUE AVF +bruit 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; skin intact, no ulcers, swelling, or tenderness  Medications:  sodium chloride      amLODipine  5 mg Oral Daily    atorvastatin  20 mg Oral QHS   Chlorhexidine Gluconate Cloth  6 each Topical Q0600   cinacalcet  60 mg Oral QPM   heparin  5,000 Units Subcutaneous Q8H   hydrALAZINE  75 mg Oral Q8H   mirtazapine  15 mg Oral QHS   sevelamer carbonate  800 mg Oral TID WC   sodium chloride flush  3 mL Intravenous Q12H   Home meds include - norvasc 10, lipitor , clonidine 0.1 tid, sensipar, hydralazine 75 tid, remeron , renvela 1 ac tid    Dialysis Orders: MWF [place]   3.5h  51kg   2/2 bath  P4  AVF  Heparin none  - no esa or vdra  Assessment/Plan: Hyperkalemia / sinus bradycardia - Bradycardia likely due to [place] S/p temporizing measures and HD 11/4.  Resolved. HR 90s. K+ creeping up. On Lokelma 10g TID. Was on renal  diet but now back on clear liquids d/t #2. Awaiting am labs.  Abd pain / acute pancreatitis - hx of same in the past. CT+ changes. Per pmd ESRD - on HD MWF. Had HD Friday. Next HD 11/7 HTN/ volume - BP's wnl. No vol excess on exam. Clonidine held Cont other meds. UF to dry weight as tolerated.  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

## 2021-04-14 NOTE — Progress Notes (Signed)
PROGRESS NOTE    Ashley [name]  ELF:[id number] DOB: 1950/06/17 DOA: 04/11/2021 PCP: Sonia Side., FNP   Brief Narrative: 70 year old with past medical history significant for hypertension, pancreatitis, HLD, ESRD on hemodialysis who presents complaining of abdominal pain, nausea and vomiting.  She reports epigastric pain 9 out of 10.  She reports similar episodes previously related to pancreatitis. Evaluation in the ED she was found to have high blood pressure, CT scan show acute on chronic pancreatitis no pancreatic necrosis.  Potassium 5.6, creatinine 15, AST 14, ALT 8, lipase 153.  Patient subsequently developed bradycardia with heart rate in the 40s, repeated B med potassium 6.8.  Insulin, glucose, calcium gluconate, albuterol and bicarb order. Nephrology consulted patient had urgent HD 11/04.     Assessment & Plan:   Principal Problem:   Acute on chronic pancreatitis (HCC) Active Problems:   ESRD on hemodialysis (HCC)   Pancreatitis   Hyperkalemia   Hypertensive urgency  1-Bradycardia; Resolved, likely related to hyperkalemia.  She was  alert and conversant, denies chest pain, dyspnea, dizziness. EKG atrial bradycardia.  -Repeated B-met  show potassium was 6.8. -Renal consulted for HD>  -Received Insulin, glucose, albuterol and bicarb ordered.  -Had urgent HD 11/04. Resolved.   2-Acute Pancreatitis.  Reports abdominal pain has improved.  Plan to continue with clear diet.  Pain management. Recent ultrasound 01/2021 negative for gallstone. Continue with clear diet, pain management.  IgG pending. Will check triglycerides.   3-ESRD; hemodialysis:  Nephrology following.   4-Hyperkalemia;  Likely related to ESRD Insulin, glucose albuterol bicarb ordered.   Had HD 11/04. Appreciate nephrology assistance.  Resolved.   HTN Urgency:  Continue with hydralazine,  Norvasc. BP in the 120, if rebound will resume clonidine.   Estimated body mass index is 22.22 kg/m as  calculated from the following:   Height as of this encounter: '5\' 2"'  (1.575 m).   Weight as of this encounter: 55.1 kg.   DVT prophylaxis: Heparin Code Status: Full code Family Communication: Care discussed with patient Disposition Plan:  Status is: Inpatient  Remains inpatient appropriate because: Patient presented with bradycardia, hyperkalemia, acute pancreatitis        Consultants:  Nephrology  Procedures:  Hemodialysis  Antimicrobials:    Subjective: Continue to have pain, 7/10. Plan to continue with current diet.  She gets episode of pancreatitis every 4 months, last several years, she doesn't drink alcohol.     Objective: Vitals:   04/13/21 2338 04/14/21 0359 04/14/21 0805 04/14/21 1143  BP: 139/85 (!) 154/75 (!) 152/75 139/79  Pulse: 94 91 97 99  Resp: '17 16 14 18  ' Temp: 99.4 F (37.4 C) 98.9 F (37.2 C) 98.1 F (36.7 C) 98.9 F (37.2 C)  TempSrc: Oral Oral Oral Oral  SpO2: 93% 91% 92% 95%  Weight:  55.1 kg    Height:        Intake/Output Summary (Last 24 hours) at 04/14/2021 1259 Last data filed at 04/14/2021 1028 Gross per 24 hour  Intake 727 ml  Output 0 ml  Net 727 ml    Filed Weights   04/11/21 2054 04/14/21 0359  Weight: 53.1 kg 55.1 kg    Examination:  General exam: NAD Respiratory system: CTA Cardiovascular system: S 1, S 2 RRR Gastrointestinal system: BS present, soft, nt Central nervous system:= alert Extremities: symmetric power.    Data Reviewed: I have personally reviewed following labs and imaging studies  CBC: Recent Labs  Lab 04/10/21 0724 04/11/21 2059 04/12/21  0840  WBC 8.0 7.6 7.4  HGB 13.4 12.6 11.1*  HCT 41.5 38.5 34.2*  MCV 93.3 91.7 91.9  PLT 250 239 354    Basic Metabolic Panel: Recent Labs  Lab 04/10/21 0724 04/11/21 2059 04/12/21 0840 04/12/21 1043 04/12/21 1836 04/12/21 2150 04/13/21 0143 04/13/21 0615 04/14/21 0942  NA 136 133* 130*  --   --   --   --   --  132*  K 5.1 5.6* 6.8*   <  > 4.0 4.5 4.8 5.2* 4.5  CL 93* 89* 89*  --   --   --   --   --  90*  CO2 '25 25 26  ' --   --   --   --   --  25  GLUCOSE 100* 84 90  --   --   --   --   --  108*  BUN 69* 81* 88*  --   --   --   --   --  44*  CREATININE 11.61* 15.01* 16.32*  --   --   --   --   --  13.72*  CALCIUM 9.0 9.0 8.5*  --   --   --   --   --  8.3*  PHOS  --   --   --   --   --   --   --  4.8*  --    < > = values in this interval not displayed.    GFR: Estimated Creatinine Clearance: 2.9 mL/min (A) (by C-G formula based on SCr of 13.72 mg/dL (H)). Liver Function Tests: Recent Labs  Lab 04/10/21 0724 04/11/21 2059  AST 15 14*  ALT 12 8  ALKPHOS 116 117  BILITOT 0.7 0.9  PROT 7.5 7.6  ALBUMIN 4.0 4.0    Recent Labs  Lab 04/10/21 0724 04/11/21 2059  LIPASE 274* 153*    No results for input(s): AMMONIA in the last 168 hours. Coagulation Profile: No results for input(s): INR, PROTIME in the last 168 hours. Cardiac Enzymes: No results for input(s): CKTOTAL, CKMB, CKMBINDEX, TROPONINI in the last 168 hours. BNP (last 3 results) No results for input(s): PROBNP in the last 8760 hours. HbA1C: No results for input(s): HGBA1C in the last 72 hours. CBG: No results for input(s): GLUCAP in the last 168 hours. Lipid Profile: No results for input(s): CHOL, HDL, LDLCALC, TRIG, CHOLHDL, LDLDIRECT in the last 72 hours. Thyroid Function Tests: No results for input(s): TSH, T4TOTAL, FREET4, T3FREE, THYROIDAB in the last 72 hours. Anemia Panel: No results for input(s): VITAMINB12, FOLATE, FERRITIN, TIBC, IRON, RETICCTPCT in the last 72 hours. Sepsis Labs: Recent Labs  Lab 04/12/21 0314 04/12/21 1043  LATICACIDVEN 1.2 1.9     Recent Results (from the past 240 hour(s))  Resp Panel by RT-PCR (Flu A&B, Covid) Nasopharyngeal Swab     Status: None   Collection Time: 04/12/21  9:26 AM   Specimen: Nasopharyngeal Swab; Nasopharyngeal(NP) swabs in vial transport medium  Result Value Ref Range Status   SARS  Coronavirus 2 by RT PCR NEGATIVE NEGATIVE Final    Comment: (NOTE) SARS-CoV-2 target nucleic acids are NOT DETECTED.  The SARS-CoV-2 RNA is generally detectable in upper respiratory specimens during the acute phase of infection. The lowest concentration of SARS-CoV-2 viral copies this assay can detect is 138 copies/mL. A negative result does not preclude SARS-Cov-2 infection and should not be used as the sole basis for treatment or other patient management decisions. A negative result  may occur with  improper specimen collection/handling, submission of specimen other than nasopharyngeal swab, presence of viral mutation(s) within the areas targeted by this assay, and inadequate number of viral copies(<138 copies/mL). A negative result must be combined with clinical observations, patient history, and epidemiological information. The expected result is Negative.  Fact Sheet for Patients:  EntrepreneurPulse.com.au  Fact Sheet for Healthcare Providers:  IncredibleEmployment.be  This test is no t yet approved or cleared by the [place] FDA and  has been authorized for detection and/or diagnosis of SARS-CoV-2 by FDA under an Emergency Use Authorization (EUA). This EUA will remain  in effect (meaning this test can be used) for the duration of the COVID-19 declaration under Section 564(b)(1) of the Act, 21 U.S.C.section 360bbb-3(b)(1), unless the authorization is terminated  or revoked sooner.       Influenza A by PCR NEGATIVE NEGATIVE Final   Influenza B by PCR NEGATIVE NEGATIVE Final    Comment: (NOTE) The Xpert Xpress SARS-CoV-2/FLU/RSV plus assay is intended as an aid in the diagnosis of influenza from Nasopharyngeal swab specimens and should not be used as a sole basis for treatment. Nasal washings and aspirates are unacceptable for Xpert Xpress SARS-CoV-2/FLU/RSV testing.  Fact Sheet for  Patients: EntrepreneurPulse.com.au  Fact Sheet for Healthcare Providers: IncredibleEmployment.be  This test is not yet approved or cleared by the [place] FDA and has been authorized for detection and/or diagnosis of SARS-CoV-2 by FDA under an Emergency Use Authorization (EUA). This EUA will remain in effect (meaning this test can be used) for the duration of the COVID-19 declaration under Section 564(b)(1) of the Act, 21 U.S.C. section 360bbb-3(b)(1), unless the authorization is terminated or revoked.  Performed at [facility], [place] [address], [place], [place] [address]           Radiology Studies: No results found.      Scheduled Meds:  amLODipine  5 mg Oral Daily   atorvastatin  20 mg Oral QHS   Chlorhexidine Gluconate Cloth  6 each Topical Q0600   cinacalcet  60 mg Oral QPM   heparin  5,000 Units Subcutaneous Q8H   hydrALAZINE  75 mg Oral Q8H   mirtazapine  15 mg Oral QHS   sevelamer carbonate  800 mg Oral TID WC   sodium chloride flush  3 mL Intravenous Q12H   Continuous Infusions:  sodium chloride       LOS: 2 days    Time spent: 35 minutes.     Elmarie Shiley, MD [facility]   If 7PM-7AM, please contact night-coverage [email or web address]  04/14/2021, 12:59 PM

## 2021-04-14 NOTE — Progress Notes (Signed)
[facility] night cross cover note:  I was notified by patient's RN of patient's request to regress her renal diet to clear liquid diet, with patient reporting worsening prandial/postprandial abdominal discomfort following initiation of renal diet, with patient specifically requesting to discontinue existing renal diet order in favor of a clear liquid diet.  I placed corresponding order.     Babs Bertin, DO Hospitalist

## 2021-04-15 LAB — IGG 4: IgG, Subclass 4: 49 mg/dL (ref 2–96)

## 2021-04-15 LAB — CBC
HCT: 38.2 % (ref 36.0–46.0)
Hemoglobin: 13.2 g/dL (ref 12.0–15.0)
MCH: 31.4 pg (ref 26.0–34.0)
MCHC: 34.6 g/dL (ref 30.0–36.0)
MCV: 90.7 fL (ref 80.0–100.0)
Platelets: 254 10*3/uL (ref 150–400)
RBC: 4.21 MIL/uL (ref 3.87–5.11)
RDW: 17.1 % — ABNORMAL HIGH (ref 11.5–15.5)
WBC: 7.4 10*3/uL (ref 4.0–10.5)
nRBC: 0 % (ref 0.0–0.2)

## 2021-04-15 LAB — BASIC METABOLIC PANEL
Anion gap: 16 — ABNORMAL HIGH (ref 5–15)
BUN: 46 mg/dL — ABNORMAL HIGH (ref 8–23)
CO2: 24 mmol/L (ref 22–32)
Calcium: 8 mg/dL — ABNORMAL LOW (ref 8.9–10.3)
Chloride: 92 mmol/L — ABNORMAL LOW (ref 98–111)
Creatinine, Ser: 15.22 mg/dL — ABNORMAL HIGH (ref 0.44–1.00)
GFR, Estimated: 2 mL/min — ABNORMAL LOW (ref >60)
Glucose, Bld: 112 mg/dL — ABNORMAL HIGH (ref 70–99)
Potassium: 5.8 mmol/L — ABNORMAL HIGH (ref 3.5–5.1)
Sodium: 132 mmol/L — ABNORMAL LOW (ref 135–145)

## 2021-04-15 MED ORDER — LIDOCAINE HCL (PF) 1 % IJ SOLN: 5.0000 mL | INTRAMUSCULAR | Status: DC | PRN

## 2021-04-15 MED ORDER — SODIUM CHLORIDE 0.9 % IV SOLN: 100.0000 mL | INTRAVENOUS | Status: DC | PRN

## 2021-04-15 MED ORDER — LIDOCAINE-PRILOCAINE 2.5-2.5 % EX CREA: 1.0000 "application " | TOPICAL_CREAM | CUTANEOUS | Status: DC | PRN

## 2021-04-15 MED ORDER — KETOROLAC TROMETHAMINE 15 MG/ML IJ SOLN
7.5000 mg | Freq: Two times a day (BID) | INTRAMUSCULAR | Status: DC | PRN
  Administered 2021-04-15 (×2): 7.5 mg via INTRAVENOUS
  Filled 2021-04-15 (×2): qty 1

## 2021-04-15 MED ORDER — PENTAFLUOROPROP-TETRAFLUOROETH EX AERO: 1.0000 "application " | INHALATION_SPRAY | CUTANEOUS | Status: DC | PRN

## 2021-04-15 MED ORDER — KETOROLAC TROMETHAMINE 15 MG/ML IJ SOLN: 7.5000 mg | Freq: Three times a day (TID) | INTRAMUSCULAR | Status: DC | PRN

## 2021-04-15 MED ORDER — ALTEPLASE 2 MG IJ SOLR: 2.0000 mg | Freq: Once | INTRAMUSCULAR | Status: DC | PRN

## 2021-04-15 MED ORDER — HEPARIN SODIUM (PORCINE) 1000 UNIT/ML DIALYSIS: 1000.0000 [IU] | INTRAMUSCULAR | Status: DC | PRN

## 2021-04-15 NOTE — Progress Notes (Signed)
[facility] Progress Note   Subjective: Seen in room. Feeling better. Asked her to expound on what she meant by "AVF looks funny". She says it looks like it's swollen. 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   Liver Function Tests: Recent Labs  Lab 04/10/21 0724 04/11/21 2059  AST 15 14*  ALT 12 8  ALKPHOS 116 117  BILITOT 0.7 0.9  PROT 7.5 7.6  ALBUMIN 4.0 4.0   Recent Labs  Lab 04/10/21 0724 04/11/21 2059  LIPASE 274* 153*   CBC: Recent Labs  Lab 04/10/21 0724 04/11/21 2059 04/12/21 0840 04/15/21 0118  WBC 8.0 7.6 7.4 7.4  HGB 13.4 12.6 11.1* 13.2  HCT 41.5 38.5 34.2* 38.2  MCV 93.3 91.7 91.9 90.7  PLT 250 239 219 254   Blood Culture    Component Value Date/Time   SDES URINE, RANDOM 06/20/2017 2222   SPECREQUEST NONE 06/20/2017 2222   CULT MULTIPLE SPECIES PRESENT, SUGGEST  RECOLLECTION (A) 06/20/2017 2222   REPTSTATUS 06/22/2017 FINAL 06/20/2017 2222    Cardiac Enzymes: No results for input(s): CKTOTAL, CKMB, CKMBINDEX, TROPONINI in the last 168 hours. CBG: No results for input(s): GLUCAP in the last 168 hours. Iron Studies: No results for input(s): IRON, TIBC, TRANSFERRIN, FERRITIN in the last 72 hours. @lablastinr3 @ Studies/Results: No results found. Medications:  sodium chloride     [START ON 04/16/2021] sodium chloride     [START ON 04/16/2021] sodium chloride      amLODipine  5 mg Oral Daily   atorvastatin  20 mg Oral QHS   Chlorhexidine Gluconate Cloth  6 each Topical Q0600   cinacalcet  60 mg Oral QPM   heparin  5,000 Units Subcutaneous Q8H   hydrALAZINE  75 mg Oral Q8H   mirtazapine  15 mg Oral QHS   pantoprazole  40 mg Oral Daily   sevelamer carbonate  800 mg Oral TID WC   sodium chloride flush  3 mL Intravenous Q12H     Dialysis Orders:  MWF [place]   3.5h  51kg   2/2 bath  P4  AVF  Heparin none  - no esa or vdra   Assessment/Plan: Hyperkalemia / sinus bradycardia - Bradycardia likely due to [place] S/p temporizing measures and HD 11/4.  Resolved. HR 90s. K+ creeping up. On Lokelma 10g TID. Was on renal diet but now back on clear liquids d/t #2. Awaiting am labs.  Abd pain / acute pancreatitis - hx of same in the past. CT+ changes. Per pmd ESRD - on HD MWF. Had HD Friday. Next HD 11/7 HTN/ volume - BP's wnl. No vol excess on exam. Clonidine held Cont other meds. UF to dry weight as tolerated.  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 Hobart Marte NP-C 04/15/2021, 9:58 AM  [employer] [phone]

## 2021-04-15 NOTE — Progress Notes (Signed)
Pt receives out-pt HD at [facility] on MWF. Pt arrives at 5:15 for 5:30 chair time. Will assist at needed.  Melven Sartorius Renal Navigator [phone]

## 2021-04-15 NOTE — Evaluation (Signed)
Physical Therapy Evaluation and Discharge Patient Details Name: Ashley [name] MRN: [id number] DOB: April 25, 1951 Today's Date: 04/15/2021  History of Present Illness  70 yo female presentingto ED on 11/2 with abdominal pain, nausea, and vomiting. In ED, found to have high BP. CT showing acute on chronic pancreatitis. Subsequently developed bradycardia. PMH including hypertension, pancreatitis, HLD, ESRD on hemodialysis.  Clinical Impression   Patient evaluated by Physical Therapy with no further acute PT needs identified. All education has been completed and the patient has no further questions. Patient able to ambulate independently on unit. She reports she rides a bus to dialysis and her daughter meets her to assist her up the flight of steps when she gets home (daughter works from home). Patient is at baseline and PT is signing off. Thank you for this referral.        Recommendations for follow up therapy are one component of a multi-disciplinary discharge planning process, led by the attending physician.  Recommendations may be updated based on patient status, additional functional criteria and insurance authorization.  Follow Up Recommendations No PT follow up    Assistance Recommended at Discharge PRN (as PTA; daughter assists with IADLs)  Functional Status Assessment Patient has not had a recent decline in their functional status  Equipment Recommendations  None recommended by PT    Recommendations for Other Services       Precautions / Restrictions        Mobility  Bed Mobility Overal bed mobility: Modified Independent             General bed mobility comments: pt moved supine > seated EOB    Transfers Overall transfer level: Independent Equipment used: None Transfers: Sit to/from Stand Sit to Stand: Independent                Ambulation/Gait Ambulation/Gait assistance: Independent Gait Distance (Feet): 170 Feet Assistive device: None Gait  Pattern/deviations: WFL(Within Functional Limits)          Stairs            Wheelchair Mobility    Modified Rankin (Stroke Patients Only)       Balance Overall balance assessment: Independent                                           Pertinent Vitals/Pain Pain Assessment: 0-10 Pain Score: 5  Pain Location: Stomach Pain Descriptors / Indicators: Aching Pain Intervention(s): Limited activity within patient's tolerance    Home Living Family/patient expects to be discharged to:: Private residence Living Arrangements: Children ("I moved in with my daughter") Available Help at Discharge: Family;Available 24 hours/day (Daughter works from home) Type of Home: Apartment (Second floor) Home Access: Stairs to enter Entrance Stairs-Rails: Right (? not sure) Entrance Stairs-Number of Steps: Flight   Home Layout: One level Home Equipment: None      Prior Function Prior Level of Function : Independent/Modified Independent             Mobility Comments: Does not use DME ADLs Comments: Daughter performs IADLs including driving. Pt performs BADLs     Hand Dominance   Dominant Hand: Right    Extremity/Trunk Assessment   Upper Extremity Assessment Upper Extremity Assessment: Overall WFL for tasks assessed    Lower Extremity Assessment Lower Extremity Assessment: Overall WFL for tasks assessed    Cervical / Trunk Assessment Cervical / Trunk Assessment:  Normal  Communication   Communication: No difficulties  Cognition Arousal/Alertness: Awake/alert Behavior During Therapy: WFL for tasks assessed/performed Overall Cognitive Status: Within Functional Limits for tasks assessed                                          General Comments General comments (skin integrity, edema, etc.): just returned from HD and felt a bit weak when walking but was steady    Exercises     Assessment/Plan    PT Assessment Patient does not  need any further PT services  PT Problem List         PT Treatment Interventions      PT Goals (Current goals can be found in the Care Plan section)  Acute Rehab PT Goals PT Goal Formulation: All assessment and education complete, DC therapy    Frequency     Barriers to discharge        Co-evaluation               AM-PAC PT "6 Clicks" Mobility  Outcome Measure Help needed turning from your back to your side while in a flat bed without using bedrails?: None Help needed moving from lying on your back to sitting on the side of a flat bed without using bedrails?: None Help needed moving to and from a bed to a chair (including a wheelchair)?: None Help needed standing up from a chair using your arms (e.g., wheelchair or bedside chair)?: None Help needed to walk in hospital room?: None Help needed climbing 3-5 steps with a railing? : None 6 Click Score: 24    End of Session   Activity Tolerance: Patient tolerated treatment well Patient left: in bed;with call bell/phone within reach Nurse Communication: Mobility status;Other (comment) (dc from PT) PT Visit Diagnosis: Difficulty in walking, not elsewhere classified (R26.2)    Time: 8756-4332 PT Time Calculation (min) (ACUTE ONLY): 13 min   Charges:   PT Evaluation $PT Eval Low Complexity: [place], PT Acute Rehabilitation Services  Pager [phone] Office [phone]   Rexanne Mano 04/15/2021, 3:23 PM

## 2021-04-15 NOTE — Evaluation (Signed)
Occupational Therapy Evaluation Patient Details Name: Ashley [name] MRN: [id number] DOB: 04-22-1951 Today's Date: 04/15/2021   History of Present Illness 70 yo female presentingto ED on 11/2 with abdominal pain, nausea, and vomiting. In ED, found to have high BP. CT showing acute on chronic pancreatitis. Subsequently developed bradycardia. PMH including hypertension, pancreatitis, HLD, ESRD on hemodialysis.   Clinical Impression   PTA, pt independent with ADLs and functional mobility. Lives with daughter who assists with IADLs, in second floor apartment. Currently, pt limited by abdominal pain during session, able perform bed mobility Mod I, and ambulate household distance to sink for standing ADL Min Guard with VSS. Pt able to complete LE dressing sitting EOB with SPV. Pt pain primarily limiting activity tolerance at this time, however pt has no acute OT needs, will s/o. Recommend safe d/c home with daughter.      Recommendations for follow up therapy are one component of a multi-disciplinary discharge planning process, led by the attending physician.  Recommendations may be updated based on patient status, additional functional criteria and insurance authorization.   Follow Up Recommendations  No OT follow up    Assistance Recommended at Discharge Intermittent Supervision/Assistance  Functional Status Assessment  Patient has had a recent decline in their functional status and demonstrates the ability to make significant improvements in function in a reasonable and predictable amount of time.  Equipment Recommendations  None recommended by OT    Recommendations for Other Services PT consult     Precautions / Restrictions Restrictions Weight Bearing Restrictions: No      Mobility Bed Mobility Overal bed mobility: Modified Independent             General bed mobility comments: pt moved supine > seated EOB    Transfers Overall transfer level: Needs assistance Equipment  used: None Transfers: Sit to/from Stand Sit to Stand: Min guard           General transfer comment: pt reported mild increase in pain after standing/walking household distance      Balance Overall balance assessment: Mild deficits observed, not formally tested                                         ADL either performed or assessed with clinical judgement   ADL Overall ADL's : Needs assistance/impaired Eating/Feeding: Set up;Sitting   Grooming: Oral care;Standing;Applying deodorant;Min guard   Upper Body Bathing: Min guard;Standing   Lower Body Bathing: Minimal assistance;Sitting/lateral leans   Upper Body Dressing : Supervision/safety;Sitting   Lower Body Dressing: Supervision/safety;Sitting/lateral leans   Toilet Transfer: Min guard;Ambulation Toilet Transfer Details (indicate cue type and reason): simulated toilet transfer [place] and Hygiene: Sit to/from stand;Min guard               Vision Baseline Vision/History: 1 Wears glasses Patient Visual Report: No change from baseline       Perception     Praxis      Pertinent Vitals/Pain Pain Assessment: 0-10 Pain Score: 6  Faces Pain Scale: Hurts a little bit Pain Location: Stomach Pain Intervention(s): Limited activity within patient's tolerance;Monitored during session     Hand Dominance     Extremity/Trunk Assessment Upper Extremity Assessment Upper Extremity Assessment: Overall WFL for tasks assessed   Lower Extremity Assessment Lower Extremity Assessment: Defer to PT evaluation   Cervical / Trunk Assessment Cervical / Trunk Assessment: Normal  Communication Communication Communication: No difficulties   Cognition Arousal/Alertness: Awake/alert Behavior During Therapy: WFL for tasks assessed/performed Overall Cognitive Status: Within Functional Limits for tasks assessed                                       General Comments        Exercises     Shoulder Instructions      Home Living Family/patient expects to be discharged to:: Private residence Living Arrangements: Children ("I moved in with my daughter") Available Help at Discharge: Family;Available 24 hours/day (Daughter works from home) Type of Home: Apartment (Second floor) Home Access: Stairs to enter Technical brewer of Steps: Flight Entrance Stairs-Rails: Right (? not sure) Home Layout: One level     Bathroom Shower/Tub: Teacher, early years/pre: Standard     Home Equipment: None          Prior Functioning/Environment Prior Level of Function : Independent/Modified Independent             Mobility Comments: Does not use DME ADLs Comments: Daughter performs IADLs including driving. Pt performs BADLs        OT Problem List: Decreased activity tolerance;Pain      OT Treatment/Interventions:      OT Goals(Current goals can be found in the care plan section) Acute Rehab OT Goals Patient Stated Goal: "i want to go home" OT Goal Formulation: With patient Time For Goal Achievement: 04/29/21 Potential to Achieve Goals: Good  OT Frequency:     Barriers to D/C:            Co-evaluation              AM-PAC OT "6 Clicks" Daily Activity     Outcome Measure Help from another person eating meals?: None Help from another person taking care of personal grooming?: None Help from another person toileting, which includes using toliet, bedpan, or urinal?: None Help from another person bathing (including washing, rinsing, drying)?: A Little Help from another person to put on and taking off regular upper body clothing?: A Little Help from another person to put on and taking off regular lower body clothing?: A Little 6 Click Score: 21   End of Session Equipment Utilized During Treatment: Gait belt Nurse Communication: Mobility status  Activity Tolerance: Patient tolerated treatment well;Patient limited by  pain Patient left: in bed;with call bell/phone within reach  OT Visit Diagnosis: Unsteadiness on feet (R26.81);Other abnormalities of gait and mobility (R26.89);Pain;Muscle weakness (generalized) (M62.81)                Time: 8003-4917 OT Time Calculation (min): 19 min Charges:  OT General Charges $OT Visit: 1 Visit OT Evaluation $OT Eval Low Complexity: 1 Low  Lynnda Child, OTD, OTR/L Acute Rehab [phone] - [place] 04/15/2021, 8:30 AM

## 2021-04-15 NOTE — Progress Notes (Signed)
PROGRESS NOTE    Ashley [name]  CMK:[id number] DOB: 10/19/1950 DOA: 04/11/2021 PCP: Sonia Side., FNP   Brief Narrative: 70 year old with past medical history significant for hypertension, pancreatitis, HLD, ESRD on hemodialysis who presents complaining of abdominal pain, nausea and vomiting.  She reports epigastric pain 9 out of 10.  She reports similar episodes previously related to pancreatitis. Evaluation in the ED she was found to have high blood pressure, CT scan show acute on chronic pancreatitis no pancreatic necrosis.  Potassium 5.6, creatinine 15, AST 14, ALT 8, lipase 153.  Patient subsequently developed bradycardia with heart rate in the 40s, repeated B med potassium 6.8.  Insulin, glucose, calcium gluconate, albuterol and bicarb order. Nephrology consulted patient had urgent HD 11/04.     Assessment & Plan:   Principal Problem:   Acute on chronic pancreatitis (HCC) Active Problems:   ESRD on hemodialysis (HCC)   Pancreatitis   Hyperkalemia   Hypertensive urgency  1-Bradycardia; Resolved, likely related to hyperkalemia.  She was  alert and conversant, denies chest pain, dyspnea, dizziness. EKG atrial bradycardia.  -Repeated B-met  show potassium was 6.8. -Renal consulted for HD>  -Received Insulin, glucose, albuterol and bicarb ordered.  -Had urgent HD 11/04. Resolved.   2-Acute Pancreatitis.  Recent ultrasound 01/2021 negative for gallstone. IgG pending. Triglycerides 108. Report some improvement of abdominal pain, feels she can advanced diet today.  Added low dose Toradol, which help with pain.   3-ESRD; hemodialysis:  Nephrology following.   4-Hyperkalemia;  Likely related to ESRD Insulin, glucose albuterol bicarb ordered.   Had HD 11/04. Appreciate nephrology assistance.  K elevated today, no bradycardia. Plan for HD today.   HTN Urgency:  Continue with hydralazine,  Norvasc. BP in the 120, if rebound will resume clonidine.   Estimated body  mass index is 22.22 kg/m as calculated from the following:   Height as of this encounter: '5\' 2"'  (1.575 m).   Weight as of this encounter: 55.1 kg.   DVT prophylaxis: Heparin Code Status: Full code Family Communication: Care discussed with patient Disposition Plan:  Status is: Inpatient  Remains inpatient appropriate because: Patient presented with bradycardia, hyperkalemia, acute pancreatitis        Consultants:  Nephrology  Procedures:  Hemodialysis  Antimicrobials:    Subjective: She report pain 6/10. She believes we can advanced her diet.    Objective: Vitals:   04/14/21 1601 04/14/21 2000 04/14/21 2322 04/15/21 0400  BP: 135/80 (!) 148/77 (!) 145/69 126/72  Pulse: 99 98 (!) 107 94  Resp: '16 13 14 14  ' Temp: 98.3 F (36.8 C) 97.6 F (36.4 C) 97.9 F (36.6 C) 97.9 F (36.6 C)  TempSrc: Oral Oral Oral Oral  SpO2: 92% 92% 94% 94%  Weight:      Height:        Intake/Output Summary (Last 24 hours) at 04/15/2021 0803 Last data filed at 04/14/2021 1310 Gross per 24 hour  Intake 300 ml  Output --  Net 300 ml    Filed Weights   04/11/21 2054 04/14/21 0359  Weight: 53.1 kg 55.1 kg    Examination:  General exam: NAD Respiratory system: CTA Cardiovascular system: S 1, S 2 RRR Gastrointestinal system: BS present, soft, mild tender  Central nervous system: Alert, follows command Extremities: No edema  Data Reviewed: I have personally reviewed following labs and imaging studies  CBC: Recent Labs  Lab 04/10/21 0724 04/11/21 2059 04/12/21 0840  WBC 8.0 7.6 7.4  HGB 13.4 12.6  11.1*  HCT 41.5 38.5 34.2*  MCV 93.3 91.7 91.9  PLT 250 239 161    Basic Metabolic Panel: Recent Labs  Lab 04/10/21 0724 04/11/21 2059 04/12/21 0840 04/12/21 1043 04/12/21 2150 04/13/21 0143 04/13/21 0615 04/14/21 0942 04/15/21 0118  NA 136 133* 130*  --   --   --   --  132* 132*  K 5.1 5.6* 6.8*   < > 4.5 4.8 5.2* 4.5 5.8*  CL 93* 89* 89*  --   --   --   --  90*  92*  CO2 '25 25 26  ' --   --   --   --  25 24  GLUCOSE 100* 84 90  --   --   --   --  108* 112*  BUN 69* 81* 88*  --   --   --   --  44* 46*  CREATININE 11.61* 15.01* 16.32*  --   --   --   --  13.72* 15.22*  CALCIUM 9.0 9.0 8.5*  --   --   --   --  8.3* 8.0*  PHOS  --   --   --   --   --   --  4.8*  --   --    < > = values in this interval not displayed.    GFR: Estimated Creatinine Clearance: 2.6 mL/min (A) (by C-G formula based on SCr of 15.22 mg/dL (H)). Liver Function Tests: Recent Labs  Lab 04/10/21 0724 04/11/21 2059  AST 15 14*  ALT 12 8  ALKPHOS 116 117  BILITOT 0.7 0.9  PROT 7.5 7.6  ALBUMIN 4.0 4.0    Recent Labs  Lab 04/10/21 0724 04/11/21 2059  LIPASE 274* 153*    No results for input(s): AMMONIA in the last 168 hours. Coagulation Profile: No results for input(s): INR, PROTIME in the last 168 hours. Cardiac Enzymes: No results for input(s): CKTOTAL, CKMB, CKMBINDEX, TROPONINI in the last 168 hours. BNP (last 3 results) No results for input(s): PROBNP in the last 8760 hours. HbA1C: No results for input(s): HGBA1C in the last 72 hours. CBG: No results for input(s): GLUCAP in the last 168 hours. Lipid Profile: Recent Labs    04/14/21 0942  TRIG 108   Thyroid Function Tests: No results for input(s): TSH, T4TOTAL, FREET4, T3FREE, THYROIDAB in the last 72 hours. Anemia Panel: No results for input(s): VITAMINB12, FOLATE, FERRITIN, TIBC, IRON, RETICCTPCT in the last 72 hours. Sepsis Labs: Recent Labs  Lab 04/12/21 0314 04/12/21 1043  LATICACIDVEN 1.2 1.9     Recent Results (from the past 240 hour(s))  Resp Panel by RT-PCR (Flu A&B, Covid) Nasopharyngeal Swab     Status: None   Collection Time: 04/12/21  9:26 AM   Specimen: Nasopharyngeal Swab; Nasopharyngeal(NP) swabs in vial transport medium  Result Value Ref Range Status   SARS Coronavirus 2 by RT PCR NEGATIVE NEGATIVE Final    Comment: (NOTE) SARS-CoV-2 target nucleic acids are NOT  DETECTED.  The SARS-CoV-2 RNA is generally detectable in upper respiratory specimens during the acute phase of infection. The lowest concentration of SARS-CoV-2 viral copies this assay can detect is 138 copies/mL. A negative result does not preclude SARS-Cov-2 infection and should not be used as the sole basis for treatment or other patient management decisions. A negative result may occur with  improper specimen collection/handling, submission of specimen other than nasopharyngeal swab, presence of viral mutation(s) within the areas targeted by this assay, and inadequate  number of viral copies(<138 copies/mL). A negative result must be combined with clinical observations, patient history, and epidemiological information. The expected result is Negative.  Fact Sheet for Patients:  EntrepreneurPulse.com.au  Fact Sheet for Healthcare Providers:  IncredibleEmployment.be  This test is no t yet approved or cleared by the [place] FDA and  has been authorized for detection and/or diagnosis of SARS-CoV-2 by FDA under an Emergency Use Authorization (EUA). This EUA will remain  in effect (meaning this test can be used) for the duration of the COVID-19 declaration under Section 564(b)(1) of the Act, 21 U.S.C.section 360bbb-3(b)(1), unless the authorization is terminated  or revoked sooner.       Influenza A by PCR NEGATIVE NEGATIVE Final   Influenza B by PCR NEGATIVE NEGATIVE Final    Comment: (NOTE) The Xpert Xpress SARS-CoV-2/FLU/RSV plus assay is intended as an aid in the diagnosis of influenza from Nasopharyngeal swab specimens and should not be used as a sole basis for treatment. Nasal washings and aspirates are unacceptable for Xpert Xpress SARS-CoV-2/FLU/RSV testing.  Fact Sheet for Patients: EntrepreneurPulse.com.au  Fact Sheet for Healthcare Providers: IncredibleEmployment.be  This test is not yet  approved or cleared by the [place] FDA and has been authorized for detection and/or diagnosis of SARS-CoV-2 by FDA under an Emergency Use Authorization (EUA). This EUA will remain in effect (meaning this test can be used) for the duration of the COVID-19 declaration under Section 564(b)(1) of the Act, 21 U.S.C. section 360bbb-3(b)(1), unless the authorization is terminated or revoked.  Performed at [facility], [place] [address], [place], [place] [address]           Radiology Studies: No results found.      Scheduled Meds:  amLODipine  5 mg Oral Daily   atorvastatin  20 mg Oral QHS   Chlorhexidine Gluconate Cloth  6 each Topical Q0600   cinacalcet  60 mg Oral QPM   heparin  5,000 Units Subcutaneous Q8H   hydrALAZINE  75 mg Oral Q8H   mirtazapine  15 mg Oral QHS   pantoprazole  40 mg Oral Daily   sevelamer carbonate  800 mg Oral TID WC   sodium chloride flush  3 mL Intravenous Q12H   Continuous Infusions:  sodium chloride       LOS: 3 days    Time spent: 35 minutes.     Elmarie Shiley, MD [facility]   If 7PM-7AM, please contact night-coverage [email or web address]  04/15/2021, 8:03 AM

## 2021-04-15 NOTE — Care Management Important Message (Signed)
Important Message  Patient Details  Name: Ashley [name] MRN: [id number] Date of Birth: [birth year]   Medicare Important Message Given:  Yes     Orbie Pyo 04/15/2021, 4:22 PM

## 2021-04-16 LAB — CBC
HCT: 38.6 % (ref 36.0–46.0)
Hemoglobin: 12.7 g/dL (ref 12.0–15.0)
MCH: 30.7 pg (ref 26.0–34.0)
MCHC: 32.9 g/dL (ref 30.0–36.0)
MCV: 93.2 fL (ref 80.0–100.0)
Platelets: 236 10*3/uL (ref 150–400)
RBC: 4.14 MIL/uL (ref 3.87–5.11)
RDW: 17 % — ABNORMAL HIGH (ref 11.5–15.5)
WBC: 5.2 10*3/uL (ref 4.0–10.5)
nRBC: 0 % (ref 0.0–0.2)

## 2021-04-16 LAB — BASIC METABOLIC PANEL
Anion gap: 13 (ref 5–15)
BUN: 19 mg/dL (ref 8–23)
CO2: 24 mmol/L (ref 22–32)
Calcium: 7.7 mg/dL — ABNORMAL LOW (ref 8.9–10.3)
Chloride: 96 mmol/L — ABNORMAL LOW (ref 98–111)
Creatinine, Ser: 8.72 mg/dL — ABNORMAL HIGH (ref 0.44–1.00)
GFR, Estimated: 4 mL/min — ABNORMAL LOW (ref >60)
Glucose, Bld: 95 mg/dL (ref 70–99)
Potassium: 4.5 mmol/L (ref 3.5–5.1)
Sodium: 133 mmol/L — ABNORMAL LOW (ref 135–145)

## 2021-04-16 MED ORDER — METOPROLOL SUCCINATE ER 25 MG PO TB24
12.5000 mg | ORAL_TABLET | Freq: Every day | ORAL | Status: DC
  Administered 2021-04-16: 12.5 mg via ORAL
  Filled 2021-04-16: qty 1

## 2021-04-16 MED ORDER — OXYCODONE HCL 5 MG PO TABS: 5.0000 mg | ORAL_TABLET | ORAL | 0 refills | Status: AC | PRN

## 2021-04-16 MED ORDER — CLONIDINE HCL 0.1 MG PO TABS: 0.1000 mg | ORAL_TABLET | Freq: Three times a day (TID) | ORAL | Status: DC

## 2021-04-16 MED ORDER — SENNOSIDES-DOCUSATE SODIUM 8.6-50 MG PO TABS
1.0000 | ORAL_TABLET | Freq: Every evening | ORAL | 0 refills | Status: AC | PRN
Start: 2021-04-16 — End: ?

## 2021-04-16 MED ORDER — CLONIDINE HCL 0.1 MG PO TABS
0.1000 mg | ORAL_TABLET | Freq: Two times a day (BID) | ORAL | Status: DC
  Administered 2021-04-16: 0.1 mg via ORAL
  Filled 2021-04-16: qty 1

## 2021-04-16 NOTE — Discharge Summary (Signed)
Physician Discharge Summary  Ashley [name]:[id number] DOB: February 26, 1951 DOA: 04/11/2021  PCP: Sonia Side., FNP  Admit date: 04/11/2021 Discharge date: 04/16/2021  Admitted From: Home  Disposition:  Home   Recommendations for Outpatient Follow-up:  Follow up with PCP in 1-2 weeks Please obtain BMP/CBC in one week Needs referral to GI due to recurrent pancreatitis.   Home Health: Equipment/Devices:  Discharge Condition: Stable.  CODE STATUS: Full Code Diet recommendation: Renal Diet.   Brief/Interim Summary: 70 year old with past medical history significant for hypertension, pancreatitis, HLD, ESRD on hemodialysis who presents complaining of abdominal pain, nausea and vomiting.  She reports epigastric pain 9 out of 10.  She reports similar episodes previously related to pancreatitis. Evaluation in the ED she was found to have high blood pressure, CT scan show acute on chronic pancreatitis no pancreatic necrosis.  Potassium 5.6, creatinine 15, AST 14, ALT 8, lipase 153.   Patient subsequently developed bradycardia with heart rate in the 40s, repeated B med potassium 6.8.  Insulin, glucose, calcium gluconate, albuterol and bicarb order. Nephrology consulted patient had urgent HD 11/04.      1-Bradycardia; Resolved, likely related to hyperkalemia.  She was  alert and conversant, denies chest pain, dyspnea, dizziness. EKG atrial bradycardia.  -Repeated B-met  show potassium was 6.8. -Renal consulted for HD>  -Received Insulin, glucose, albuterol and bicarb ordered.  -Had urgent HD 11/04. Resolved.  Now tachycardic, will resume home low dose metoprolol/    2-Acute Pancreatitis.  Recent ultrasound 01/2021 negative for gallstone. IgG pending. Triglycerides 108. Toradol,  help with pain.  Pain improved. Tolerating diet. She wants to go home.   3-ESRD; hemodialysis:  Nephrology following.    4-Hyperkalemia;  Likely related to ESRD Insulin, glucose albuterol bicarb ordered.    Had HD 11/04. Appreciate nephrology assistance.  Resolved.    HTN Urgency:  Continue with hydralazine,  Norvasc. Resume Clonidine.   Discharge Diagnoses:  Principal Problem:   Acute on chronic pancreatitis [facility]) Active Problems:   ESRD on hemodialysis ([place])   Pancreatitis   Hyperkalemia   Hypertensive urgency    Discharge Instructions  Discharge Instructions     Diet - low sodium heart healthy   Complete by: As directed    Increase activity slowly   Complete by: As directed       Allergies as of 04/16/2021       Reactions   Aspirin Nausea And Vomiting   Stomach ache   Ibuprofen Nausea And Vomiting   Stomach ache        Medication List     STOP taking these medications    feeding supplement (NEPRO CARB STEADY) Liqd   methocarbamol 500 MG tablet Commonly known as: ROBAXIN   mirtazapine 15 MG tablet Commonly known as: REMERON   polyethylene glycol 17 g packet Commonly known as: MiraLax   predniSONE 20 MG tablet Commonly known as: DELTASONE       TAKE these medications    acetaminophen 325 MG tablet Commonly known as: TYLENOL Take 2 tablets (650 mg total) by mouth every 6 (six) hours as needed for mild pain (or Fever >/= 101).   amLODipine 10 MG tablet Commonly known as: NORVASC Take 1 tablet (10 mg total) by mouth daily. What changed: when to take this   atorvastatin 20 MG tablet Commonly known as: LIPITOR Take 20 mg by mouth at bedtime.   cinacalcet 60 MG tablet Commonly known as: SENSIPAR Take 60 mg by mouth every evening.  cloNIDine 0.1 MG tablet Commonly known as: CATAPRES Take 0.1 mg by mouth 2 (two) times daily.   DIALYVITE 800 WITH ZINC 0.8 MG Tabs Take 1 tablet by mouth daily.   hydrALAZINE 25 MG tablet Commonly known as: APRESOLINE Take 3 tablets (75 mg total) by mouth every 8 (eight) hours.   lidocaine-prilocaine cream Commonly known as: EMLA Apply 1 application topically See admin instructions. Apply small amount  to access site on Tuesday, Thursday, Saturday one hour before dialysis. Cover with occlusive dressing (saran wrap)   metoprolol succinate 25 MG 24 hr tablet Commonly known as: TOPROL-XL Take 0.5 tablets (12.5 mg total) by mouth daily.   MIRCERA IJ Administered at dialysis   ondansetron 4 MG disintegrating tablet Commonly known as: Zofran ODT Take 1 tablet (4 mg total) by mouth every 8 (eight) hours as needed for nausea or vomiting.   oxyCODONE 5 MG immediate release tablet Commonly known as: Roxicodone Take 1 tablet (5 mg total) by mouth every 4 (four) hours as needed for up to 5 days for severe pain.   pantoprazole 40 MG tablet Commonly known as: PROTONIX Take 1 tablet (40 mg total) by mouth daily.   senna-docusate 8.6-50 MG tablet Commonly known as: Senokot-S Take 1 tablet by mouth at bedtime as needed for mild constipation.   sevelamer carbonate 800 MG tablet Commonly known as: RENVELA Take 800 mg by mouth 3 (three) times daily with meals.        Follow-up Information     Sonia Side., FNP Follow up in 1 week(s).   Specialty: Family Medicine Contact information: [place] [id number] [phone]                Allergies  Allergen Reactions   Aspirin Nausea And Vomiting    Stomach ache   Ibuprofen Nausea And Vomiting    Stomach ache    Consultations: Nephrology    Procedures/Studies: CT ABDOMEN PELVIS W CONTRAST  Result Date: 04/12/2021 CLINICAL DATA:  Abdominal pain.  Pancreatitis symptoms. EXAM: CT ABDOMEN AND PELVIS WITH CONTRAST TECHNIQUE: Multidetector CT imaging of the abdomen and pelvis was performed using the standard protocol following bolus administration of intravenous contrast. CONTRAST:  115m OMNIPAQUE IOHEXOL 300 MG/ML  SOLN COMPARISON:  Multiple prior CTs are present dating back to 2006. The most recent is 02/06/2021 FINDINGS: Lower chest: No acute abnormality. Cardiomegaly. 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 Hepatobiliary: No focal liver abnormality is seen. No gallstones, gallbladder wall thickening, or biliary dilatation. Pancreas: There is peripancreatic edema along side the head and neck segments, and increased prominence of the pancreatic duct in the interval. There are edematous changes in the pancreatic head but there is no appreciable mass enhancement in the pancreas. There is trace nonlocalizing peripancreatic fluid. No pancreatic hemorrhage, necrosis or abscess are seen. Spleen: Normal. Adrenals/Urinary Tract: There is no adrenal mass. There is mild adrenal hyperplasia. 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 There are no stones, hydronephrosis or mass enhancement. Bladder is not fully distended but could be thickened. Stomach/Bowel: There is moderate to severe gastric thickening which was seen previously. There is no small bowel obstruction or inflammation. An appendix is not seen but no right lower quadrant inflammatory process appears to be present. There is fecal retention ascending  and transverse colon, moderate amount. Left colonic diverticula without evidence of diverticulitis Vascular/Lymphatic: Aortic atherosclerosis. No enlarged abdominal or pelvic lymph nodes. There is extensive aortoiliac calcific plaque, relatively sparing the external iliacs. Reproductive: The uterus is absent.  There is no adnexal mass. Other: There is no pelvic ascites. There is no free air, hemorrhage or abscess. Musculoskeletal: Osteopenia and degenerative change of the spine, including with advanced L5-S1 disc collapse. No destructive bone lesions. IMPRESSION: 1. Evidence of acute pancreatitis involving the head and neck segments with increased pancreatic ductal dilatation and minimal nonlocalizing  peripancreatic fluid. No pancreatic abscess or appreciable necrosis. No mass enhancement is seen. 2. Moderate to severe thickening in the gastric wall. This was seen previously. Endoscopic follow-up may be indicated but this is probably due to chronic gastritis. 3. Constipation. 4. Small kidneys with atrophy and numerous small cysts and too small to characterize hypodensities. 5. Cystitis versus bladder nondistention. Electronically Signed   By: Telford Nab M.D.   On: 04/12/2021 04:20     Subjective: She report improvement of abdominal pain. She tolerated diet.  She wants to go home   Discharge Exam: Vitals:   04/15/21 1910 04/15/21 2328  BP: (!) 144/85 (!) 165/91  Pulse: (!) 107 (!) 110  Resp: 18 16  Temp: 97.9 F (36.6 C) 98.2 F (36.8 C)  SpO2: 97% 97%     General: Pt is alert, awake, not in acute distress Cardiovascular: RRR, S1/S2 +, no rubs, no gallops Respiratory: CTA bilaterally, no wheezing, no rhonchi Abdominal: Soft, NT, ND, bowel sounds + Extremities: no edema, no cyanosis    The results of significant diagnostics from this hospitalization (including imaging, microbiology, ancillary and laboratory) are listed below for reference.     Microbiology: Recent Results (from the past 240 hour(s))  Resp Panel by RT-PCR (Flu A&B, Covid) Nasopharyngeal Swab     Status: None   Collection Time: 04/12/21  9:26 AM   Specimen: Nasopharyngeal Swab; Nasopharyngeal(NP) swabs in vial transport medium  Result Value Ref Range Status   SARS Coronavirus 2 by RT PCR NEGATIVE NEGATIVE Final    Comment: (NOTE) SARS-CoV-2 target nucleic acids are NOT DETECTED.  The SARS-CoV-2 RNA is generally detectable in upper respiratory specimens during the acute phase of infection. The lowest concentration of SARS-CoV-2 viral copies this assay can detect is 138 copies/mL. A negative result does not preclude SARS-Cov-2 infection and should not be used as the sole basis for treatment or other  patient management decisions. A negative result may occur with  improper specimen collection/handling, submission of specimen other than nasopharyngeal swab, presence of viral mutation(s) within the areas targeted by this assay, and inadequate number of viral copies(<138 copies/mL). A negative result must be combined with clinical observations, patient history, and epidemiological information. The expected result is Negative.  Fact Sheet for Patients:  EntrepreneurPulse.com.au  Fact Sheet for Healthcare Providers:  IncredibleEmployment.be  This test is no t yet approved or cleared by the [place] FDA and  has been authorized for detection and/or diagnosis of SARS-CoV-2 by FDA under an Emergency Use Authorization (EUA). This EUA will remain  in effect (meaning this test can be used) for the duration of the COVID-19 declaration under Section 564(b)(1) of the Act, 21 U.S.C.section 360bbb-3(b)(1), unless the authorization is terminated  or revoked sooner.       Influenza A by PCR NEGATIVE NEGATIVE Final   Influenza B by PCR NEGATIVE NEGATIVE Final    Comment: (NOTE) The Xpert Xpress SARS-CoV-2/FLU/RSV plus  assay is intended as an aid in the diagnosis of influenza from Nasopharyngeal swab specimens and should not be used as a sole basis for treatment. Nasal washings and aspirates are unacceptable for Xpert Xpress SARS-CoV-2/FLU/RSV testing.  Fact Sheet for Patients: EntrepreneurPulse.com.au  Fact Sheet for Healthcare Providers: IncredibleEmployment.be  This test is not yet approved or cleared by the [place] FDA and has been authorized for detection and/or diagnosis of SARS-CoV-2 by FDA under an Emergency Use Authorization (EUA). This EUA will remain in effect (meaning this test can be used) for the duration of the COVID-19 declaration under Section 564(b)(1) of the Act, 21 U.S.C. section  360bbb-3(b)(1), unless the authorization is terminated or revoked.  Performed at [facility], [place] [address], [place], [place] [address]      Labs: BNP (last 3 results) No results for input(s): BNP in the last 8760 hours. Basic Metabolic Panel: Recent Labs  Lab 04/11/21 2059 04/12/21 0840 04/12/21 1043 04/13/21 0143 04/13/21 0615 04/14/21 0942 04/15/21 0118 04/16/21 0209  NA 133* 130*  --   --   --  132* 132* 133*  K 5.6* 6.8*   < > 4.8 5.2* 4.5 5.8* 4.5  CL 89* 89*  --   --   --  90* 92* 96*  CO2 25 26  --   --   --  '25 24 24  ' GLUCOSE 84 90  --   --   --  108* 112* 95  BUN 81* 88*  --   --   --  44* 46* 19  CREATININE 15.01* 16.32*  --   --   --  13.72* 15.22* 8.72*  CALCIUM 9.0 8.5*  --   --   --  8.3* 8.0* 7.7*  PHOS  --   --   --   --  4.8*  --   --   --    < > = values in this interval not displayed.   Liver Function Tests: Recent Labs  Lab 04/10/21 0724 04/11/21 2059  AST 15 14*  ALT 12 8  ALKPHOS 116 117  BILITOT 0.7 0.9  PROT 7.5 7.6  ALBUMIN 4.0 4.0   Recent Labs  Lab 04/10/21 0724 04/11/21 2059  LIPASE 274* 153*   No results for input(s): AMMONIA in the last 168 hours. CBC: Recent Labs  Lab 04/10/21 0724 04/11/21 2059 04/12/21 0840 04/15/21 0118 04/16/21 0209  WBC 8.0 7.6 7.4 7.4 5.2  HGB 13.4 12.6 11.1* 13.2 12.7  HCT 41.5 38.5 34.2* 38.2 38.6  MCV 93.3 91.7 91.9 90.7 93.2  PLT 250 239 219 254 236   Cardiac Enzymes: No results for input(s): CKTOTAL, CKMB, CKMBINDEX, TROPONINI in the last 168 hours. BNP: Invalid input(s): POCBNP CBG: No results for input(s): GLUCAP in the last 168 hours. D-Dimer No results for input(s): DDIMER in the last 72 hours. Hgb A1c No results for input(s): HGBA1C in the last 72 hours. Lipid Profile Recent Labs    04/14/21 0942  TRIG 108   Thyroid function studies No results for input(s): TSH, T4TOTAL, T3FREE, THYROIDAB in the last 72 hours.  Invalid input(s): FREET3 Anemia work up No  results for input(s): VITAMINB12, FOLATE, FERRITIN, TIBC, IRON, RETICCTPCT in the last 72 hours. Urinalysis    Component Value Date/Time   COLORURINE YELLOW 12/31/2018 1014   APPEARANCEUR CLEAR 12/31/2018 1014   LABSPEC 1.008 12/31/2018 1014   PHURINE 9.0 (H) 12/31/2018 1014   GLUCOSEU NEGATIVE 12/31/2018 1014   HGBUR SMALL (A)  12/31/2018 [place] 12/31/2018 1014   BILIRUBINUR NEG 01/29/2016 1142   [place] 12/31/2018 1014   PROTEINUR >=300 (A) 12/31/2018 1014   UROBILINOGEN 0.2 01/29/2016 1142   UROBILINOGEN 0.2 03/21/2015 1649   NITRITE NEGATIVE 12/31/2018 1014   LEUKOCYTESUR NEGATIVE 12/31/2018 1014   Sepsis Labs Invalid input(s): PROCALCITONIN,  WBC,  LACTICIDVEN Microbiology Recent Results (from the past 240 hour(s))  Resp Panel by RT-PCR (Flu A&B, Covid) Nasopharyngeal Swab     Status: None   Collection Time: 04/12/21  9:26 AM   Specimen: Nasopharyngeal Swab; Nasopharyngeal(NP) swabs in vial transport medium  Result Value Ref Range Status   SARS Coronavirus 2 by RT PCR NEGATIVE NEGATIVE Final    Comment: (NOTE) SARS-CoV-2 target nucleic acids are NOT DETECTED.  The SARS-CoV-2 RNA is generally detectable in upper respiratory specimens during the acute phase of infection. The lowest concentration of SARS-CoV-2 viral copies this assay can detect is 138 copies/mL. A negative result does not preclude SARS-Cov-2 infection and should not be used as the sole basis for treatment or other patient management decisions. A negative result may occur with  improper specimen collection/handling, submission of specimen other than nasopharyngeal swab, presence of viral mutation(s) within the areas targeted by this assay, and inadequate number of viral copies(<138 copies/mL). A negative result must be combined with clinical observations, patient history, and epidemiological information. The expected result is Negative.  Fact Sheet for Patients:   EntrepreneurPulse.com.au  Fact Sheet for Healthcare Providers:  IncredibleEmployment.be  This test is no t yet approved or cleared by the [place] FDA and  has been authorized for detection and/or diagnosis of SARS-CoV-2 by FDA under an Emergency Use Authorization (EUA). This EUA will remain  in effect (meaning this test can be used) for the duration of the COVID-19 declaration under Section 564(b)(1) of the Act, 21 U.S.C.section 360bbb-3(b)(1), unless the authorization is terminated  or revoked sooner.       Influenza A by PCR NEGATIVE NEGATIVE Final   Influenza B by PCR NEGATIVE NEGATIVE Final    Comment: (NOTE) The Xpert Xpress SARS-CoV-2/FLU/RSV plus assay is intended as an aid in the diagnosis of influenza from Nasopharyngeal swab specimens and should not be used as a sole basis for treatment. Nasal washings and aspirates are unacceptable for Xpert Xpress SARS-CoV-2/FLU/RSV testing.  Fact Sheet for Patients: EntrepreneurPulse.com.au  Fact Sheet for Healthcare Providers: IncredibleEmployment.be  This test is not yet approved or cleared by the [place] FDA and has been authorized for detection and/or diagnosis of SARS-CoV-2 by FDA under an Emergency Use Authorization (EUA). This EUA will remain in effect (meaning this test can be used) for the duration of the COVID-19 declaration under Section 564(b)(1) of the Act, 21 U.S.C. section 360bbb-3(b)(1), unless the authorization is terminated or revoked.  Performed at [facility], [place] [address], [place], [place] [address]      Time coordinating discharge: 40 minutes  SIGNED:   Elmarie Shiley, MD  [facility]

## 2021-04-16 NOTE — Plan of Care (Signed)

## 2021-04-17 ENCOUNTER — Telehealth: Payer: Self-pay | Admitting: Nurse Practitioner

## 2021-04-17 NOTE — Telephone Encounter (Signed)
Transition of care contact from inpatient facility  Date of discharge: 04/16/2021 Date of contact: 04/17/2021 Method: Phone Spoke to: Patient  Patient contacted to discuss transition of care from recent inpatient hospitalization. Patient was admitted to [facility] from 11/03-11/01/2021 with discharge diagnosis of pancreatitis.   Medication changes were reviewed.  Patient will follow up with his/her outpatient HD unit on: 04/17/2021

## 2021-05-12 ENCOUNTER — Other Ambulatory Visit: Payer: Self-pay

## 2021-05-12 ENCOUNTER — Emergency Department (HOSPITAL_COMMUNITY)
Admission: EM | Admit: 2021-05-12 | Discharge: 2021-05-12 | Disposition: A | Discharge status: Home / Self Care | Payer: Medicare Other | Attending: Emergency Medicine | Admitting: Emergency Medicine

## 2021-05-12 ENCOUNTER — Encounter (HOSPITAL_COMMUNITY): Payer: Self-pay

## 2021-05-12 DIAGNOSIS — I120 Hypertensive chronic kidney disease with stage 5 chronic kidney disease or end stage renal disease: Secondary | ICD-10-CM | POA: Diagnosis not present

## 2021-05-12 DIAGNOSIS — R110 Nausea: Secondary | ICD-10-CM | POA: Diagnosis not present

## 2021-05-12 DIAGNOSIS — R1013 Epigastric pain: Secondary | ICD-10-CM | POA: Insufficient documentation

## 2021-05-12 DIAGNOSIS — F17210 Nicotine dependence, cigarettes, uncomplicated: Secondary | ICD-10-CM | POA: Diagnosis not present

## 2021-05-12 DIAGNOSIS — N186 End stage renal disease: Secondary | ICD-10-CM | POA: Diagnosis not present

## 2021-05-12 DIAGNOSIS — Z992 Dependence on renal dialysis: Secondary | ICD-10-CM | POA: Insufficient documentation

## 2021-05-12 DIAGNOSIS — Z79899 Other long term (current) drug therapy: Secondary | ICD-10-CM | POA: Diagnosis not present

## 2021-05-12 LAB — CBC
HCT: 27.8 % — ABNORMAL LOW (ref 36.0–46.0)
Hemoglobin: 8.9 g/dL — ABNORMAL LOW (ref 12.0–15.0)
MCH: 30 pg (ref 26.0–34.0)
MCHC: 32 g/dL (ref 30.0–36.0)
MCV: 93.6 fL (ref 80.0–100.0)
Platelets: 356 10*3/uL (ref 150–400)
RBC: 2.97 MIL/uL — ABNORMAL LOW (ref 3.87–5.11)
RDW: 16.6 % — ABNORMAL HIGH (ref 11.5–15.5)
WBC: 7.4 10*3/uL (ref 4.0–10.5)
nRBC: 0 % (ref 0.0–0.2)

## 2021-05-12 LAB — COMPREHENSIVE METABOLIC PANEL
ALT: 8 U/L (ref 0–44)
AST: 16 U/L (ref 15–41)
Albumin: 3.5 g/dL (ref 3.5–5.0)
Alkaline Phosphatase: 94 U/L (ref 38–126)
Anion gap: 12 (ref 5–15)
BUN: 32 mg/dL — ABNORMAL HIGH (ref 8–23)
CO2: 30 mmol/L (ref 22–32)
Calcium: 8.1 mg/dL — ABNORMAL LOW (ref 8.9–10.3)
Chloride: 99 mmol/L (ref 98–111)
Creatinine, Ser: 9.88 mg/dL — ABNORMAL HIGH (ref 0.44–1.00)
GFR, Estimated: 4 mL/min — ABNORMAL LOW (ref >60)
Glucose, Bld: 88 mg/dL (ref 70–99)
Potassium: 4.4 mmol/L (ref 3.5–5.1)
Sodium: 141 mmol/L (ref 135–145)
Total Bilirubin: 0.4 mg/dL (ref 0.3–1.2)
Total Protein: 6.8 g/dL (ref 6.5–8.1)

## 2021-05-12 LAB — LIPASE, BLOOD: Lipase: 82 U/L — ABNORMAL HIGH (ref 11–51)

## 2021-05-12 MED ORDER — ALUM & MAG HYDROXIDE-SIMETH 200-200-20 MG/5ML PO SUSP
30.0000 mL | Freq: Once | ORAL | Status: AC
  Administered 2021-05-12: 16:00:00 30 mL via ORAL
  Filled 2021-05-12: qty 30

## 2021-05-12 MED ORDER — ONDANSETRON 4 MG PO TBDP
4.0000 mg | ORAL_TABLET | Freq: Once | ORAL | Status: AC
  Administered 2021-05-12: 16:00:00 4 mg via ORAL
  Filled 2021-05-12: qty 1

## 2021-05-12 MED ORDER — ONDANSETRON HCL 4 MG PO TABS: 4.0000 mg | ORAL_TABLET | Freq: Four times a day (QID) | ORAL | 0 refills | Status: DC

## 2021-05-12 MED ORDER — LIDOCAINE VISCOUS HCL 2 % MT SOLN
15.0000 mL | Freq: Once | OROMUCOSAL | Status: AC
  Administered 2021-05-12: 16:00:00 15 mL via ORAL
  Filled 2021-05-12: qty 15

## 2021-05-12 MED ORDER — MORPHINE SULFATE (PF) 4 MG/ML IV SOLN: 4.0000 mg | Freq: Once | INTRAVENOUS | Status: DC

## 2021-05-12 MED ORDER — ONDANSETRON HCL 4 MG/2ML IJ SOLN: 4.0000 mg | Freq: Once | INTRAMUSCULAR | Status: DC

## 2021-05-12 MED ORDER — MORPHINE SULFATE (PF) 4 MG/ML IV SOLN
4.0000 mg | Freq: Once | INTRAVENOUS | Status: AC
  Administered 2021-05-12: 16:00:00 4 mg via INTRAMUSCULAR
  Filled 2021-05-12: qty 1

## 2021-05-12 MED ORDER — OXYCODONE-ACETAMINOPHEN 5-325 MG PO TABS: 1.0000 | ORAL_TABLET | Freq: Four times a day (QID) | ORAL | 0 refills | Status: DC | PRN

## 2021-05-12 NOTE — ED Notes (Signed)
Pt unable to urinate, dialysis pt
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
## 2021-05-12 NOTE — ED Triage Notes (Signed)
Patient complains of generalized abdominal pain with nausea since eating stromboli last night. Patient has hx of pancreatitis

## 2021-05-12 NOTE — ED Notes (Signed)
1 IV attempt without success.

## 2021-05-12 NOTE — Discharge Instructions (Signed)
Your history, exam, work-up today are concerning for a likely gastric or esophageal cause of your burning and aching discomfort in the upper abdomen after the suspicious stromboli.  We did find your hemoglobin is decreased from prior and offered rectal blood testing and even imaging however given your improving symptoms and desire to follow-up as an outpatient, we felt it was reasonable to continue her home meds, use the pain and nausea medicine, and follow-up with your PCP and GI team in several days for recheck of your labs.  If any symptoms are to change or worsen acutely, please return to the nearest emergency department if you start having the blood in your stools or start feeling lightheaded, fatigue, or any other concerning symptoms, please return to the nearest emergency department.

## 2021-05-13 ENCOUNTER — Encounter (HOSPITAL_COMMUNITY): Payer: Self-pay | Admitting: Radiology

## 2021-06-06 ENCOUNTER — Other Ambulatory Visit: Payer: Self-pay | Admitting: Student

## 2021-06-06 DIAGNOSIS — E2839 Other primary ovarian failure: Secondary | ICD-10-CM

## 2021-06-28 ENCOUNTER — Ambulatory Visit: Payer: Medicare Other | Admitting: Physician Assistant

## 2021-07-12 ENCOUNTER — Ambulatory Visit: Payer: Medicare Other | Admitting: Nurse Practitioner

## 2021-09-09 ENCOUNTER — Other Ambulatory Visit: Payer: Self-pay | Admitting: Family

## 2021-09-09 DIAGNOSIS — J439 Emphysema, unspecified: Secondary | ICD-10-CM

## 2021-09-09 DIAGNOSIS — F17211 Nicotine dependence, cigarettes, in remission: Secondary | ICD-10-CM

## 2021-10-02 ENCOUNTER — Other Ambulatory Visit: Payer: Medicare Other

## 2021-11-14 ENCOUNTER — Inpatient Hospital Stay: Admission: RE | Admit: 2021-11-14 | Payer: Medicare Other | Source: Ambulatory Visit

## 2022-09-02 ENCOUNTER — Emergency Department (HOSPITAL_COMMUNITY): Payer: 59

## 2022-09-02 ENCOUNTER — Other Ambulatory Visit: Payer: Self-pay

## 2022-09-02 ENCOUNTER — Encounter (HOSPITAL_COMMUNITY): Payer: Self-pay | Admitting: Radiology

## 2022-09-02 ENCOUNTER — Inpatient Hospital Stay (HOSPITAL_COMMUNITY)
Admission: EM | Admit: 2022-09-02 | Discharge: 2022-09-06 | DRG: 193 | Disposition: A | Discharge status: Home / Self Care | Payer: 59 | Attending: Family Medicine | Admitting: Family Medicine

## 2022-09-02 DIAGNOSIS — K861 Other chronic pancreatitis: Secondary | ICD-10-CM | POA: Diagnosis present

## 2022-09-02 DIAGNOSIS — F17210 Nicotine dependence, cigarettes, uncomplicated: Secondary | ICD-10-CM | POA: Diagnosis present

## 2022-09-02 DIAGNOSIS — Z8673 Personal history of transient ischemic attack (TIA), and cerebral infarction without residual deficits: Secondary | ICD-10-CM

## 2022-09-02 DIAGNOSIS — N186 End stage renal disease: Secondary | ICD-10-CM

## 2022-09-02 DIAGNOSIS — Z992 Dependence on renal dialysis: Secondary | ICD-10-CM

## 2022-09-02 DIAGNOSIS — J9601 Acute respiratory failure with hypoxia: Secondary | ICD-10-CM | POA: Diagnosis present

## 2022-09-02 DIAGNOSIS — D631 Anemia in chronic kidney disease: Secondary | ICD-10-CM | POA: Diagnosis present

## 2022-09-02 DIAGNOSIS — J189 Pneumonia, unspecified organism: Principal | ICD-10-CM | POA: Insufficient documentation

## 2022-09-02 DIAGNOSIS — Z83438 Family history of other disorder of lipoprotein metabolism and other lipidemia: Secondary | ICD-10-CM

## 2022-09-02 DIAGNOSIS — I120 Hypertensive chronic kidney disease with stage 5 chronic kidney disease or end stage renal disease: Secondary | ICD-10-CM | POA: Diagnosis present

## 2022-09-02 DIAGNOSIS — K859 Acute pancreatitis without necrosis or infection, unspecified: Secondary | ICD-10-CM | POA: Diagnosis not present

## 2022-09-02 DIAGNOSIS — I10 Essential (primary) hypertension: Secondary | ICD-10-CM | POA: Diagnosis present

## 2022-09-02 DIAGNOSIS — Z79899 Other long term (current) drug therapy: Secondary | ICD-10-CM

## 2022-09-02 DIAGNOSIS — R34 Anuria and oliguria: Secondary | ICD-10-CM | POA: Diagnosis present

## 2022-09-02 DIAGNOSIS — Z8711 Personal history of peptic ulcer disease: Secondary | ICD-10-CM

## 2022-09-02 DIAGNOSIS — R109 Unspecified abdominal pain: Secondary | ICD-10-CM | POA: Diagnosis present

## 2022-09-02 DIAGNOSIS — Z8249 Family history of ischemic heart disease and other diseases of the circulatory system: Secondary | ICD-10-CM

## 2022-09-02 DIAGNOSIS — Z90710 Acquired absence of both cervix and uterus: Secondary | ICD-10-CM

## 2022-09-02 DIAGNOSIS — K219 Gastro-esophageal reflux disease without esophagitis: Secondary | ICD-10-CM | POA: Diagnosis present

## 2022-09-02 DIAGNOSIS — R197 Diarrhea, unspecified: Secondary | ICD-10-CM

## 2022-09-02 DIAGNOSIS — N2581 Secondary hyperparathyroidism of renal origin: Secondary | ICD-10-CM | POA: Diagnosis present

## 2022-09-02 DIAGNOSIS — E8770 Fluid overload, unspecified: Secondary | ICD-10-CM | POA: Diagnosis present

## 2022-09-02 DIAGNOSIS — E785 Hyperlipidemia, unspecified: Secondary | ICD-10-CM | POA: Diagnosis present

## 2022-09-02 LAB — CBC
HCT: 30.2 % — ABNORMAL LOW (ref 36.0–46.0)
Hemoglobin: 10 g/dL — ABNORMAL LOW (ref 12.0–15.0)
MCH: 30.7 pg (ref 26.0–34.0)
MCHC: 33.1 g/dL (ref 30.0–36.0)
MCV: 92.6 fL (ref 80.0–100.0)
Platelets: 339 10*3/uL (ref 150–400)
RBC: 3.26 MIL/uL — ABNORMAL LOW (ref 3.87–5.11)
RDW: 21.2 % — ABNORMAL HIGH (ref 11.5–15.5)
WBC: 4 10*3/uL (ref 4.0–10.5)
nRBC: 0 % (ref 0.0–0.2)

## 2022-09-02 LAB — COMPREHENSIVE METABOLIC PANEL
ALT: 14 U/L (ref 0–44)
AST: 22 U/L (ref 15–41)
Albumin: 3.7 g/dL (ref 3.5–5.0)
Alkaline Phosphatase: 55 U/L (ref 38–126)
Anion gap: 17 — ABNORMAL HIGH (ref 5–15)
BUN: 13 mg/dL (ref 8–23)
CO2: 29 mmol/L (ref 22–32)
Calcium: 8.9 mg/dL (ref 8.9–10.3)
Chloride: 94 mmol/L — ABNORMAL LOW (ref 98–111)
Creatinine, Ser: 7.33 mg/dL — ABNORMAL HIGH (ref 0.44–1.00)
GFR, Estimated: 5 mL/min — ABNORMAL LOW (ref >60)
Glucose, Bld: 84 mg/dL (ref 70–99)
Potassium: 3.8 mmol/L (ref 3.5–5.1)
Sodium: 140 mmol/L (ref 135–145)
Total Bilirubin: 0.9 mg/dL (ref 0.3–1.2)
Total Protein: 7.3 g/dL (ref 6.5–8.1)

## 2022-09-02 LAB — LIPASE, BLOOD: Lipase: 59 U/L — ABNORMAL HIGH (ref 11–51)

## 2022-09-02 LAB — TROPONIN I (HIGH SENSITIVITY): Troponin I (High Sensitivity): 11 ng/L (ref <18)

## 2022-09-02 MED ORDER — HYDROMORPHONE HCL 1 MG/ML IJ SOLN
0.5000 mg | Freq: Once | INTRAMUSCULAR | Status: AC
  Administered 2022-09-02: 0.5 mg via INTRAVENOUS
  Filled 2022-09-02: qty 1

## 2022-09-02 MED ORDER — ONDANSETRON HCL 4 MG/2ML IJ SOLN
4.0000 mg | Freq: Once | INTRAMUSCULAR | Status: AC
  Administered 2022-09-02: 4 mg via INTRAVENOUS
  Filled 2022-09-02: qty 2

## 2022-09-02 MED ORDER — OXYCODONE-ACETAMINOPHEN 5-325 MG PO TABS
2.0000 | ORAL_TABLET | Freq: Once | ORAL | Status: AC
  Administered 2022-09-02: 2 via ORAL
  Filled 2022-09-02: qty 2

## 2022-09-02 NOTE — ED Triage Notes (Signed)
Pt states she has been hurting in her abd for the past 2 weeks but was trying to hold off from coming to the doctor. Pt states this feels like her usual pancreatitis flair. Pt also endorses diarrhea. Denies chest pain, but shortness of breath. Pt is a dialysis patient,

## 2022-09-02 NOTE — ED Provider Notes (Signed)
[facility] Provider Note   CSN: [id number] Arrival date & time: 09/02/22  2154     History  Chief Complaint  Patient presents with   Abdominal Pain    MAKENNA AMOROSO is a 72 y.o. female with a past medical history of hypertension, PUD, polysubstance use, ESRD on dialysis and chronic pancreatitis presenting today with abdominal pain.  She reports has been going on for 2 weeks and feels like her previous episodes of pancreatitis.  Denies any vomiting but endorses intermittent nausea and diarrhea.  Says that she is not tolerating p.o., not even water.  No fevers or chills.  No chest pain, palpitations, shortness of breath.  Pain does not radiate.  Patient is a MWF dialysis patient and has not missed any sessions.  Patient is anuric.   Abdominal Pain      Home Medications Prior to Admission medications   Medication Sig Start Date End Date Taking? Authorizing Provider  acetaminophen (TYLENOL) 325 MG tablet Take 2 tablets (650 mg total) by mouth every 6 (six) hours as needed for mild pain (or Fever >/= 101). Patient not taking: Reported on 04/13/2021 01/11/20   Little Ishikawa, MD  amLODipine (NORVASC) 10 MG tablet Take 1 tablet (10 mg total) by mouth daily. Patient taking differently: Take 10 mg by mouth daily at 12 noon. 11/11/16   Katheren Shams, DO  atorvastatin (LIPITOR) 20 MG tablet Take 20 mg by mouth at bedtime. 08/05/20   [provider]  B Complex-C-Zn-Folic Acid (DIALYVITE Q000111Q WITH ZINC) 0.8 MG TABS Take 1 tablet by mouth daily.  07/07/19   [provider]  cinacalcet (SENSIPAR) 60 MG tablet Take 60 mg by mouth every evening. 03/23/21   [provider]  cloNIDine (CATAPRES) 0.1 MG tablet Take 0.1 mg by mouth 2 (two) times daily. 06/25/19   [provider]  hydrALAZINE (APRESOLINE) 25 MG tablet Take 3 tablets (75 mg total) by mouth every 8 (eight) hours. 11/14/20 02/12/21  [place] Donnamarie Poag, DO   lidocaine-prilocaine (EMLA) cream Apply 1 application topically See admin instructions. Apply small amount to access site on Tuesday, Thursday, Saturday one hour before dialysis. Cover with occlusive dressing (saran wrap) 08/25/19   [provider]  metoprolol succinate (TOPROL-XL) 25 MG 24 hr tablet Take 0.5 tablets (12.5 mg total) by mouth daily. 11/14/20 04/13/21  [place] Eric J, DO  ondansetron (ZOFRAN ODT) 4 MG disintegrating tablet Take 1 tablet (4 mg total) by mouth every 8 (eight) hours as needed for nausea or vomiting. 04/10/21   Sherwood Gambler, MD  ondansetron (ZOFRAN) 4 MG tablet Take 1 tablet (4 mg total) by mouth every 6 (six) hours. 05/12/21   Tegeler, Gwenyth Allegra, MD  oxyCODONE-acetaminophen (PERCOCET/ROXICET) 5-325 MG tablet Take 1 tablet by mouth every 6 (six) hours as needed for severe pain. 05/12/21   Tegeler, Gwenyth Allegra, MD  pantoprazole (PROTONIX) 40 MG tablet Take 1 tablet (40 mg total) by mouth daily. 11/14/20 04/13/21  [place] Eric J, DO  senna-docusate (SENOKOT-S) 8.6-50 MG tablet Take 1 tablet by mouth at bedtime as needed for mild constipation. 04/16/21   Regalado, Belkys A, MD  sevelamer carbonate (RENVELA) 800 MG tablet Take 800 mg by mouth 3 (three) times daily with meals. 07/23/20   [provider]      Allergies    Aspirin and Ibuprofen    Review of Systems   Review of Systems  Gastrointestinal:  Positive for abdominal pain.  Physical Exam Updated Vital Signs BP (!) 169/82 (BP Location: Right Arm)   Pulse 94   Temp 98.8 F (37.1 C) (Oral)   Resp 17   Ht 5\' 2"  (1.575 m)   Wt 54.4 kg   SpO2 100%   BMI 21.95 kg/m  Physical Exam Vitals and nursing note reviewed.  Constitutional:      General: She is not in acute distress.    Appearance: Normal appearance. She is not ill-appearing.  HENT:     Head: Normocephalic and atraumatic.  Eyes:     General: No scleral icterus.    Conjunctiva/sclera: Conjunctivae normal.  Pulmonary:      Effort: Pulmonary effort is normal. No respiratory distress.  Abdominal:     General: Abdomen is flat.     Palpations: Abdomen is soft.     Tenderness: There is abdominal tenderness in the right upper quadrant, epigastric area and left upper quadrant.  Skin:    Findings: No rash.  Neurological:     Mental Status: She is alert.  Psychiatric:        Mood and Affect: Mood normal.     ED Results / Procedures / Treatments   Labs (all labs ordered are listed, but only abnormal results are displayed) Labs Reviewed  LIPASE, BLOOD  COMPREHENSIVE METABOLIC PANEL  CBC  URINALYSIS, ROUTINE W REFLEX MICROSCOPIC  TROPONIN I (HIGH SENSITIVITY)    EKG None  Radiology No results found.  Procedures Procedures   Medications Ordered in ED Medications  oxyCODONE-acetaminophen (PERCOCET/ROXICET) 5-325 MG per tablet 2 tablet (2 tablets Oral Given 09/02/22 2220)  ondansetron (ZOFRAN) injection 4 mg (4 mg Intravenous Given 09/02/22 2220)    ED Course/ Medical Decision Making/ A&P   {   Click here for ABCD2, HEART and other calculatorsREFRESH Note before signing :1}                          Medical Decision Making Amount and/or Complexity of Data Reviewed Labs: ordered. Radiology: ordered.  Risk Prescription drug management.   72 year old female presenting today with abdominal pain.  Has been going on for 2 weeks and feels like previous flares of pancreatitis.  Differential includes but is not limited to pancreatitis, PUD, cholecystitis, GERD, ACS.    This is not an exhaustive differential.    Past Medical History / Co-morbidities / Social History: ESRD, pancreatitis, PUD   Additional history: Per chart review patient has a history of chronic pancreatitis, polysubstance use that was denied today and PUD   Physical Exam: Pertinent physical exam findings include Epigastrium and upper quadrants TTP  Lab Tests: I ordered, and personally interpreted labs.  The pertinent results  include: Lipase 59 Normal white count   Imaging Studies: Patient reported that the nursing staff told her that her oxygen dropped and she was subsequently placed on 3 L of O2.  Chest x-ray was ordered, viewed and interpreted by me and there is some question of LLL infiltrate.  Appears more like atelectasis to me.  Normal white count, afebrile, not tachycardic or complaining of any shortness of breath, cough or chest pain.  Will not start antibiotics at this time.   Cardiac Monitoring:  The patient was maintained on a cardiac monitor.  I viewed and interpreted the cardiac monitored which showed an underlying rhythm of: Sinus   Medications: Patient was given Percocet and Zofran in triage.   Patient reports that this did not help very much.  Given  Dilaudid and ***   Consultations Obtained: I spoke with *** with *** and they ***.  MDM/Disposition: This is a ***  After patient's work-up today, I feel that *** .     I discussed this case with my attending physician Dr. Marland Kitchen who cosigned this note including patient's presenting symptoms, physical exam, and planned diagnostics and interventions. Attending physician stated agreement with plan or made changes to plan which were implemented.     {Document critical care time when appropriate:1} {Document review of labs and clinical decision tools ie heart score, Chads2Vasc2 etc:1}  {Document your independent review of radiology images, and any outside records:1} {Document your discussion with family members, caretakers, and with consultants:1} {Document social determinants of health affecting pt's care:1} {Document your decision making why or why not admission, treatments were needed:1} Final Clinical Impression(s) / ED Diagnoses Final diagnoses:  None    Rx / DC Orders ED Discharge Orders     None

## 2022-09-02 NOTE — ED Provider Triage Note (Signed)
Emergency Medicine Provider Triage Evaluation Note  Ashley [name] , a 72 y.o. female  was evaluated in triage.  Pt complains of epigastric pain.  Patient has a history of pancreatitis.  She states that for the last several days she has worsening abdominal pain and diarrhea.  Patient's last CT scan was about a year ago.  Review of Systems  Positive: Abdominal pain, diarrhea Negative: fever  Physical Exam  BP (!) 169/82 (BP Location: Right Arm)   Pulse 94   Temp 98.8 F (37.1 C) (Oral)   Resp 17   Ht 5\' 2"  (1.575 m)   Wt 54.4 kg   SpO2 100%   BMI 21.95 kg/m  Gen:   Uncomfortable  Resp:  Normal effort  MSK:   Moves extremities without difficulty  Other:  + epigastric tenderness   Medical Decision Making  Medically screening exam initiated at 10:32 PM.  Appropriate orders placed.  Ashley [name] was informed that the remainder of the evaluation will be completed by another provider, this initial triage assessment does not replace that evaluation, and the importance of remaining in the ED until their evaluation is complete.  Ashley [name] is a 72 y.o. female history of pancreatitis here presenting with abdominal pain and diarrhea.  Patient's last pancreatitis flare was about a year ago.  Plan to get CBC and CMP and lipase and CT abdomen pelvis.  Will give pain medicine and Zofran.    Drenda Freeze, MD 09/02/22 [phone]

## 2022-09-03 ENCOUNTER — Emergency Department (HOSPITAL_COMMUNITY): Payer: 59

## 2022-09-03 ENCOUNTER — Encounter (HOSPITAL_COMMUNITY): Payer: Self-pay | Admitting: Family Medicine

## 2022-09-03 DIAGNOSIS — F17210 Nicotine dependence, cigarettes, uncomplicated: Secondary | ICD-10-CM | POA: Diagnosis present

## 2022-09-03 DIAGNOSIS — N186 End stage renal disease: Secondary | ICD-10-CM | POA: Diagnosis not present

## 2022-09-03 DIAGNOSIS — Z8673 Personal history of transient ischemic attack (TIA), and cerebral infarction without residual deficits: Secondary | ICD-10-CM | POA: Diagnosis not present

## 2022-09-03 DIAGNOSIS — Z992 Dependence on renal dialysis: Secondary | ICD-10-CM

## 2022-09-03 DIAGNOSIS — R109 Unspecified abdominal pain: Secondary | ICD-10-CM | POA: Diagnosis not present

## 2022-09-03 DIAGNOSIS — D631 Anemia in chronic kidney disease: Secondary | ICD-10-CM | POA: Diagnosis present

## 2022-09-03 DIAGNOSIS — K219 Gastro-esophageal reflux disease without esophagitis: Secondary | ICD-10-CM | POA: Diagnosis present

## 2022-09-03 DIAGNOSIS — J9601 Acute respiratory failure with hypoxia: Secondary | ICD-10-CM | POA: Diagnosis present

## 2022-09-03 DIAGNOSIS — R197 Diarrhea, unspecified: Secondary | ICD-10-CM | POA: Diagnosis present

## 2022-09-03 DIAGNOSIS — Z8711 Personal history of peptic ulcer disease: Secondary | ICD-10-CM | POA: Diagnosis not present

## 2022-09-03 DIAGNOSIS — Z83438 Family history of other disorder of lipoprotein metabolism and other lipidemia: Secondary | ICD-10-CM | POA: Diagnosis not present

## 2022-09-03 DIAGNOSIS — E785 Hyperlipidemia, unspecified: Secondary | ICD-10-CM | POA: Diagnosis present

## 2022-09-03 DIAGNOSIS — E8770 Fluid overload, unspecified: Secondary | ICD-10-CM | POA: Diagnosis present

## 2022-09-03 DIAGNOSIS — K861 Other chronic pancreatitis: Secondary | ICD-10-CM | POA: Diagnosis present

## 2022-09-03 DIAGNOSIS — I10 Essential (primary) hypertension: Secondary | ICD-10-CM

## 2022-09-03 DIAGNOSIS — I120 Hypertensive chronic kidney disease with stage 5 chronic kidney disease or end stage renal disease: Secondary | ICD-10-CM | POA: Diagnosis present

## 2022-09-03 DIAGNOSIS — N2581 Secondary hyperparathyroidism of renal origin: Secondary | ICD-10-CM | POA: Diagnosis present

## 2022-09-03 DIAGNOSIS — J189 Pneumonia, unspecified organism: Secondary | ICD-10-CM | POA: Diagnosis present

## 2022-09-03 DIAGNOSIS — Z8249 Family history of ischemic heart disease and other diseases of the circulatory system: Secondary | ICD-10-CM | POA: Diagnosis not present

## 2022-09-03 DIAGNOSIS — R34 Anuria and oliguria: Secondary | ICD-10-CM | POA: Diagnosis present

## 2022-09-03 DIAGNOSIS — K859 Acute pancreatitis without necrosis or infection, unspecified: Secondary | ICD-10-CM | POA: Diagnosis not present

## 2022-09-03 DIAGNOSIS — Z79899 Other long term (current) drug therapy: Secondary | ICD-10-CM | POA: Diagnosis not present

## 2022-09-03 DIAGNOSIS — Z90710 Acquired absence of both cervix and uterus: Secondary | ICD-10-CM | POA: Diagnosis not present

## 2022-09-03 LAB — BASIC METABOLIC PANEL
Anion gap: 16 — ABNORMAL HIGH (ref 5–15)
BUN: 15 mg/dL (ref 8–23)
CO2: 25 mmol/L (ref 22–32)
Calcium: 7.8 mg/dL — ABNORMAL LOW (ref 8.9–10.3)
Chloride: 98 mmol/L (ref 98–111)
Creatinine, Ser: 7.72 mg/dL — ABNORMAL HIGH (ref 0.44–1.00)
GFR, Estimated: 5 mL/min — ABNORMAL LOW (ref >60)
Glucose, Bld: 88 mg/dL (ref 70–99)
Potassium: 4.4 mmol/L (ref 3.5–5.1)
Sodium: 139 mmol/L (ref 135–145)

## 2022-09-03 LAB — CBC
HCT: 30.2 % — ABNORMAL LOW (ref 36.0–46.0)
Hemoglobin: 9.8 g/dL — ABNORMAL LOW (ref 12.0–15.0)
MCH: 30 pg (ref 26.0–34.0)
MCHC: 32.5 g/dL (ref 30.0–36.0)
MCV: 92.4 fL (ref 80.0–100.0)
Platelets: 146 10*3/uL — ABNORMAL LOW (ref 150–400)
RBC: 3.27 MIL/uL — ABNORMAL LOW (ref 3.87–5.11)
RDW: 20.9 % — ABNORMAL HIGH (ref 11.5–15.5)
WBC: 3.5 10*3/uL — ABNORMAL LOW (ref 4.0–10.5)
nRBC: 0 % (ref 0.0–0.2)

## 2022-09-03 LAB — PROCALCITONIN: Procalcitonin: 0.35 ng/mL

## 2022-09-03 LAB — HEPATITIS B SURFACE ANTIGEN: Hepatitis B Surface Ag: NONREACTIVE

## 2022-09-03 LAB — TROPONIN I (HIGH SENSITIVITY): Troponin I (High Sensitivity): 12 ng/L (ref <18)

## 2022-09-03 MED ORDER — ALTEPLASE 2 MG IJ SOLR: 2.0000 mg | Freq: Once | INTRAMUSCULAR | Status: DC | PRN

## 2022-09-03 MED ORDER — SEVELAMER CARBONATE 800 MG PO TABS
800.0000 mg | ORAL_TABLET | Freq: Three times a day (TID) | ORAL | Status: DC
  Administered 2022-09-03 – 2022-09-06 (×10): 800 mg via ORAL
  Filled 2022-09-03 (×10): qty 1

## 2022-09-03 MED ORDER — ANTICOAGULANT SODIUM CITRATE 4% (200MG/5ML) IV SOLN
5.0000 mL | Status: DC | PRN
  Filled 2022-09-03: qty 5

## 2022-09-03 MED ORDER — SODIUM CHLORIDE 0.9 % IV SOLN
1.0000 g | Freq: Once | INTRAVENOUS | Status: DC
  Filled 2022-09-03: qty 10

## 2022-09-03 MED ORDER — SODIUM CHLORIDE 0.9 % IV SOLN
500.0000 mg | INTRAVENOUS | Status: DC
  Administered 2022-09-04 – 2022-09-06 (×2): 500 mg via INTRAVENOUS
  Filled 2022-09-03 (×3): qty 5

## 2022-09-03 MED ORDER — HYDROMORPHONE HCL 1 MG/ML IJ SOLN
0.5000 mg | INTRAMUSCULAR | Status: DC | PRN
  Administered 2022-09-03 – 2022-09-04 (×6): 0.5 mg via INTRAVENOUS
  Filled 2022-09-03 (×6): qty 0.5

## 2022-09-03 MED ORDER — HYDRALAZINE HCL 50 MG PO TABS
75.0000 mg | ORAL_TABLET | Freq: Three times a day (TID) | ORAL | Status: DC
  Administered 2022-09-03 – 2022-09-06 (×10): 75 mg via ORAL
  Filled 2022-09-03 (×10): qty 1

## 2022-09-03 MED ORDER — ACETAMINOPHEN 650 MG RE SUPP: 650.0000 mg | Freq: Four times a day (QID) | RECTAL | Status: DC | PRN

## 2022-09-03 MED ORDER — OXYCODONE HCL 5 MG PO TABS
5.0000 mg | ORAL_TABLET | ORAL | Status: DC | PRN
  Administered 2022-09-03 – 2022-09-04 (×4): 5 mg via ORAL
  Filled 2022-09-03 (×4): qty 1

## 2022-09-03 MED ORDER — AMLODIPINE BESYLATE 10 MG PO TABS
10.0000 mg | ORAL_TABLET | Freq: Every day | ORAL | Status: DC
  Administered 2022-09-03 – 2022-09-06 (×4): 10 mg via ORAL
  Filled 2022-09-03 (×4): qty 1

## 2022-09-03 MED ORDER — PANTOPRAZOLE SODIUM 40 MG PO TBEC
40.0000 mg | DELAYED_RELEASE_TABLET | Freq: Every day | ORAL | Status: DC
  Administered 2022-09-03 – 2022-09-06 (×4): 40 mg via ORAL
  Filled 2022-09-03 (×4): qty 1

## 2022-09-03 MED ORDER — SODIUM CHLORIDE 0.9 % IV SOLN
500.0000 mg | Freq: Once | INTRAVENOUS | Status: AC
  Administered 2022-09-03: 500 mg via INTRAVENOUS
  Filled 2022-09-03: qty 5

## 2022-09-03 MED ORDER — CINACALCET HCL 30 MG PO TABS
60.0000 mg | ORAL_TABLET | Freq: Every evening | ORAL | Status: DC
  Administered 2022-09-03 – 2022-09-05 (×3): 60 mg via ORAL
  Filled 2022-09-03 (×4): qty 2

## 2022-09-03 MED ORDER — SODIUM CHLORIDE 0.9 % IV SOLN
2.0000 g | Freq: Every day | INTRAVENOUS | Status: AC
  Administered 2022-09-03 – 2022-09-05 (×4): 2 g via INTRAVENOUS
  Filled 2022-09-03 (×4): qty 20

## 2022-09-03 MED ORDER — LIDOCAINE HCL (PF) 1 % IJ SOLN: 5.0000 mL | INTRAMUSCULAR | Status: DC | PRN

## 2022-09-03 MED ORDER — CHLORHEXIDINE GLUCONATE CLOTH 2 % EX PADS
6.0000 | MEDICATED_PAD | Freq: Every day | CUTANEOUS | Status: DC
  Administered 2022-09-03 – 2022-09-06 (×2): 6 via TOPICAL

## 2022-09-03 MED ORDER — ACETAMINOPHEN 325 MG PO TABS
650.0000 mg | ORAL_TABLET | Freq: Four times a day (QID) | ORAL | Status: DC | PRN
  Administered 2022-09-03: 650 mg via ORAL
  Filled 2022-09-03: qty 2

## 2022-09-03 MED ORDER — PENTAFLUOROPROP-TETRAFLUOROETH EX AERO: 1.0000 | INHALATION_SPRAY | CUTANEOUS | Status: DC | PRN

## 2022-09-03 MED ORDER — HYDROMORPHONE HCL 1 MG/ML IJ SOLN
1.0000 mg | Freq: Once | INTRAMUSCULAR | Status: AC
  Administered 2022-09-03: 1 mg via INTRAVENOUS
  Filled 2022-09-03: qty 1

## 2022-09-03 MED ORDER — ATORVASTATIN CALCIUM 10 MG PO TABS
20.0000 mg | ORAL_TABLET | Freq: Every day | ORAL | Status: DC
  Administered 2022-09-03 – 2022-09-05 (×3): 20 mg via ORAL
  Filled 2022-09-03 (×3): qty 2

## 2022-09-03 MED ORDER — LIDOCAINE-PRILOCAINE 2.5-2.5 % EX CREA: 1.0000 | TOPICAL_CREAM | CUTANEOUS | Status: DC | PRN

## 2022-09-03 MED ORDER — HEPARIN SODIUM (PORCINE) 5000 UNIT/ML IJ SOLN
5000.0000 [IU] | Freq: Three times a day (TID) | INTRAMUSCULAR | Status: DC
  Administered 2022-09-03 – 2022-09-06 (×9): 5000 [IU] via SUBCUTANEOUS
  Filled 2022-09-03 (×9): qty 1

## 2022-09-03 NOTE — ED Notes (Signed)
Patient returned from CT, decreased patients O2 to 1L Ashley [name], patient unable to tolerate it & O2 decreased to 89%. Increased O2 back to 2L [place]

## 2022-09-03 NOTE — ED Notes (Signed)
Patient O2 decreased to 87% on room air, placed patient on 2L [place] O2 increased to 94%

## 2022-09-03 NOTE — Consult Note (Signed)
[facility] Renal Consultation Note    Indication for Consultation:  Management of ESRD/hemodialysis, anemia, hypertension/volume, and secondary hyperparathyroidism.  HPI: Ashley [name] is a 72 y.o. female with PMH including ESRD on dialysis, HTN, prior CVA who presented to the ED with shortness of breath, abdominal pain and diarrhea. She reports about 2 weeks and 3-4 weeks of productive cough and shortness of breath. She does endorse eating less due to diarrhea and has been leaving slightly below her outpatient EDW (last post HD weight 50.8kg). She reports breathing is worse when moving around, no orthopnea, edema or PND. Denies CP, palpitations and dizziness. No diarrhea or nausea this AM. No recent fevers or chills. Denies any recent antibiotic use. Reports max UF she can usually tolerate is 2.5L.   On arrival, O2 sats in 80's on RA, CXR with left basilar atelectasis or easly infiltrate with small effusion. K+ 4.4, BUN  15, Cr 7.72, Ca 7.8, WBC 3.5, Hgb 9.8.   Past Medical History:  Diagnosis Date   Acute pancreatitis 2000   2000, 12/2018, 08/2019   Arthritis    Cervical radiculopathy 02/28/2011   Cocaine substance abuse ([place]) 05/26/2013   positive UDS    Duodenitis    Erosive gastropathy    ESRD on hemodialysis (HCC)    TTS   GERD (gastroesophageal reflux disease)    Hepatitis C 1987   dt hx IVDA.  genotype 2B.  Epclusa started early 04/2020.     Hiatal hernia    Hyperlipidemia 2015   Hypertension 2008   Marijuana abuse 05/27/2003   positive UDS, family members smoke as well   Pancreatitis    Progressive focal motor weakness 06/14/2017   Schatzki's ring    Stroke ([place]) 06/2017   MRI:MRI: small, subacute left internal capsule infarct.  Chronic microvascular ischemic changes w parenchymal volume loss. Chronic white matter periventricular microhemorrhage, likely due to htn   Ulcer 1990   gastric ulcer. Ruptured s/p emergency repair   Past Surgical History:  Procedure  Laterality Date   ABDOMINAL HYSTERECTOMY  1979   AV FISTULA PLACEMENT Left 06/16/2017   Procedure: ARTERIOVENOUS (AV) FISTULA CREATION LEFT ARM;  Surgeon: Conrad , MD;  Location: [place];  Service: Vascular;  Laterality: Left;   [place] Left 10/02/2017   Procedure: BASILIC VEIN TRANSPOSITION SECOND STAGE LEFT ARM;  Surgeon: Rosetta Posner, MD;  Location: [place];  Service: Vascular;  Laterality: Left;   BIOPSY  09/06/2019   Procedure: BIOPSY;  Surgeon: Gatha Mayer, MD;  Location[facility] ENDOSCOPY;  Service: Endoscopy;;   ESOPHAGOGASTRODUODENOSCOPY N/A 05/29/2013   Procedure: ESOPHAGOGASTRODUODENOSCOPY (EGD);  Surgeon: Jerene Bears, MD;  Location: [facility] ENDOSCOPY;  Service: Endoscopy;  Laterality: N/A;   ESOPHAGOGASTRODUODENOSCOPY  05/2013   for epigastric pain.  Nonobstructing Schatzki ring at GEJ, mild gastropathy, nonbleeding AVMs in bulb and D2. 5 mm sessile polyp in bulb.   ESOPHAGOGASTRODUODENOSCOPY (EGD) WITH PROPOFOL N/A 09/06/2019   Procedure: ESOPHAGOGASTRODUODENOSCOPY (EGD) WITH PROPOFOL;  Surgeon: Gatha Mayer, MD;  Location: [place];  Service: Endoscopy;  Laterality: N/A;   ESOPHAGOGASTRODUODENOSCOPY (EGD) WITH PROPOFOL N/A 02/02/2020   Procedure: ESOPHAGOGASTRODUODENOSCOPY (EGD) WITH PROPOFOL;  Surgeon: [name], MD;  Location: [facility] ENDOSCOPY;  Service: Endoscopy;  Laterality: N/A;   ESOPHAGOGASTRODUODENOSCOPY (EGD) WITH PROPOFOL N/A 02/07/2021   Procedure: ESOPHAGOGASTRODUODENOSCOPY (EGD) WITH PROPOFOL;  Surgeon: [name], MD;  Location: [facility] ENDOSCOPY;  Service: Endoscopy;  Laterality: N/A;   EUS N/A 02/02/2020   Procedure: UPPER  ENDOSCOPIC ULTRASOUND (EUS) RADIAL;  Surgeon: [name], MD;  Location: [facility] ENDOSCOPY;  Service: Endoscopy;  Laterality: N/A;   EXCHANGE OF A DIALYSIS CATHETER Left 07/31/2017   Procedure: Removal  OF A  Right GroinTUNNELED  DIALYSIS CATHETER ,  Insertion of Left Femoral Dialysis Catheter.;  Surgeon: Rosetta Posner, MD;   Location: [place];  Service: Vascular;  Laterality: Left;   HEMOSTASIS CLIP PLACEMENT  02/07/2021   Procedure: HEMOSTASIS CLIP PLACEMENT;  Surgeon: [name], MD;  Location: [place];  Service: Endoscopy;;   HOT HEMOSTASIS N/A 09/06/2019   Procedure: HOT HEMOSTASIS (ARGON PLASMA COAGULATION/BICAP);  Surgeon: Gatha Mayer, MD;  Location[facility] ENDOSCOPY;  Service: Endoscopy;  Laterality: N/A;   HOT HEMOSTASIS N/A 02/07/2021   Procedure: HOT HEMOSTASIS (ARGON PLASMA COAGULATION/BICAP);  Surgeon: [name], MD;  Location: [facility] ENDOSCOPY;  Service: Endoscopy;  Laterality: N/A;   INSERTION OF DIALYSIS CATHETER Right 06/16/2017   Procedure: INSERTION OF DIALYSIS CATHETER;  Surgeon: Conrad [place], MD;  Location: [facility] OR;  Service: Vascular;  Laterality: Right;   IR AV DIALY SHUNT INTRO NEEDLE/INTRACATH INITIAL W/PTA/IMG LEFT  06/21/2018   REPAIR OF PERFORATED ULCER  1990   gastric ulcer   Family History  Problem Relation Age of Onset   Hypertension Father    Cancer Father    Hyperlipidemia Father    Seizures Sister    Early death Daughter    Kidney disease Daughter        end stage dialysis dependent    Social History:  reports that she has been smoking cigarettes. She has a 10.00 pack-year smoking history. She has never used smokeless tobacco. She reports that she does not currently use drugs after having used the following drugs: Heroin, Marijuana, and Cocaine. She reports that she does not drink alcohol.  ROS: As per HPI otherwise negative.  Physical Exam: Vitals:   09/03/22 0245 09/03/22 0300 09/03/22 0412 09/03/22 0821  BP: (!) 161/78 (!) 175/68 (!) 156/79 (!) 148/68  Pulse: 82 94 89 80  Resp: 14 20 20 17   Temp:   98.3 F (36.8 C) 98.1 F (36.7 C)  TempSrc:   Oral Oral  SpO2: 100% 100% 100% 100%  Weight:      Height:         General: Well developed, well nourished, in no acute distress. Head: Normocephalic, atraumatic, sclera non-icteric, mucus membranes are  moist. Lungs: + rales left lower lobe. Respirations unlabored on 2L via [place] Heart: RRR with normal S1, S2. No murmurs, rubs, or gallops appreciated. Abdomen: Soft, non-tender, non-distended with normoactive bowel sounds. No rebound/guarding. No obvious abdominal masses. Musculoskeletal:  Strength and tone appear normal for age. Lower extremities: No edema bilateral lower extremities Neuro: Alert and oriented X 3. Moves all extremities spontaneously. Psych:  Responds to questions appropriately with a normal affect. Dialysis Access: LUE AVF + bruit  Allergies  Allergen Reactions   Aspirin Nausea And Vomiting    Stomach ache   Ibuprofen Nausea And Vomiting    Stomach ache   Prior to Admission medications   Medication Sig Start Date End Date Taking? Authorizing Provider  acetaminophen (TYLENOL) 325 MG tablet Take 2 tablets (650 mg total) by mouth every 6 (six) hours as needed for mild pain (or Fever >/= 101). Patient not taking: Reported on 04/13/2021 01/11/20   Little Ishikawa, MD  amLODipine (NORVASC) 10 MG tablet Take 1 tablet (10 mg total) by mouth daily. Patient taking differently: Take 10  mg by mouth daily at 12 noon. 11/11/16   Katheren Shams, DO  atorvastatin (LIPITOR) 20 MG tablet Take 20 mg by mouth at bedtime. 08/05/20   [provider]  B Complex-C-Zn-Folic Acid (DIALYVITE Q000111Q WITH ZINC) 0.8 MG TABS Take 1 tablet by mouth daily.  07/07/19   [provider]  cinacalcet (SENSIPAR) 60 MG tablet Take 60 mg by mouth every evening. 03/23/21   [provider]  cloNIDine (CATAPRES) 0.1 MG tablet Take 0.1 mg by mouth 2 (two) times daily. 06/25/19   [provider]  hydrALAZINE (APRESOLINE) 25 MG tablet Take 3 tablets (75 mg total) by mouth every 8 (eight) hours. 11/14/20 02/12/21  [place] Donnamarie Poag, DO  lidocaine-prilocaine (EMLA) cream Apply 1 application topically See admin instructions. Apply small amount to access site on Tuesday, Thursday, Saturday one hour  before dialysis. Cover with occlusive dressing (saran wrap) 08/25/19   [provider]  metoprolol succinate (TOPROL-XL) 25 MG 24 hr tablet Take 0.5 tablets (12.5 mg total) by mouth daily. 11/14/20 04/13/21  [place] Eric J, DO  ondansetron (ZOFRAN ODT) 4 MG disintegrating tablet Take 1 tablet (4 mg total) by mouth every 8 (eight) hours as needed for nausea or vomiting. 04/10/21   Sherwood Gambler, MD  ondansetron (ZOFRAN) 4 MG tablet Take 1 tablet (4 mg total) by mouth every 6 (six) hours. 05/12/21   Tegeler, Gwenyth Allegra, MD  oxyCODONE-acetaminophen (PERCOCET/ROXICET) 5-325 MG tablet Take 1 tablet by mouth every 6 (six) hours as needed for severe pain. 05/12/21   Tegeler, Gwenyth Allegra, MD  pantoprazole (PROTONIX) 40 MG tablet Take 1 tablet (40 mg total) by mouth daily. 11/14/20 04/13/21  [place] Eric J, DO  senna-docusate (SENOKOT-S) 8.6-50 MG tablet Take 1 tablet by mouth at bedtime as needed for mild constipation. 04/16/21   Regalado, Belkys A, MD  sevelamer carbonate (RENVELA) 800 MG tablet Take 800 mg by mouth 3 (three) times daily with meals. 07/23/20   [provider]   Current Facility-Administered Medications  Medication Dose Route Frequency Provider Last Rate Last Admin   acetaminophen (TYLENOL) tablet 650 mg  650 mg Oral Q6H PRN Opyd, Ilene Qua, MD       Or   acetaminophen (TYLENOL) suppository 650 mg  650 mg Rectal Q6H PRN Opyd, Ilene Qua, MD       amLODipine (NORVASC) tablet 10 mg  10 mg Oral Q1200 Opyd, Ilene Qua, MD       atorvastatin (LIPITOR) tablet 20 mg  20 mg Oral QHS Opyd, Ilene Qua, MD       [START ON 09/04/2022] azithromycin (ZITHROMAX) 500 mg in sodium chloride 0.9 % 250 mL IVPB  500 mg Intravenous Q24H Opyd, Ilene Qua, MD       cefTRIAXone (ROCEPHIN) 2 g in sodium chloride 0.9 % 100 mL IVPB  2 g Intravenous QHS Opyd, Ilene Qua, MD   Stopped at 09/03/22 0345   cinacalcet (SENSIPAR) tablet 60 mg  60 mg Oral QPM Opyd, Ilene Qua, MD       heparin injection 5,000 Units   5,000 Units Subcutaneous Q8H Opyd, Ilene Qua, MD       hydrALAZINE (APRESOLINE) tablet 75 mg  75 mg Oral Q8H Opyd, Ilene Qua, MD   75 mg at 09/03/22 0424   HYDROmorphone (DILAUDID) injection 0.5 mg  0.5 mg Intravenous Q4H PRN Opyd, Ilene Qua, MD   0.5 mg at 09/03/22 [id number]   oxyCODONE (Oxy IR/ROXICODONE) immediate release tablet 5 mg  5 mg Oral  Q4H PRN Vianne Bulls, MD   5 mg at 09/03/22 0425   pantoprazole (PROTONIX) EC tablet 40 mg  40 mg Oral Daily Opyd, Ilene Qua, MD       sevelamer carbonate (RENVELA) tablet 800 mg  800 mg Oral TID WC Opyd, Ilene Qua, MD   800 mg at 09/03/22 0824   Labs: Basic Metabolic Panel: Recent Labs  Lab 09/02/22 2212 09/03/22 0437  NA 140 139  K 3.8 4.4  CL 94* 98  CO2 29 25  GLUCOSE 84 88  BUN 13 15  CREATININE 7.33* 7.72*  CALCIUM 8.9 7.8*   Liver Function Tests: Recent Labs  Lab 09/02/22 2212  AST 22  ALT 14  ALKPHOS 55  BILITOT 0.9  PROT 7.3  ALBUMIN 3.7   Recent Labs  Lab 09/02/22 2212  LIPASE 59*    CBC: Recent Labs  Lab 09/02/22 2212 09/03/22 0437  WBC 4.0 3.5*  HGB 10.0* 9.8*  HCT 30.2* 30.2*  MCV 92.6 92.4  PLT 339 146*    Studies/Results: CT ABDOMEN PELVIS WO CONTRAST  Result Date: 09/03/2022 CLINICAL DATA:  Abdominal pain for 2 weeks, concern for pancreatitis. Diarrhea. EXAM: CT ABDOMEN AND PELVIS WITHOUT CONTRAST TECHNIQUE: Multidetector CT imaging of the abdomen and pelvis was performed following the standard protocol without IV contrast. RADIATION DOSE REDUCTION: This exam was performed according to the departmental dose-optimization program which includes automated exposure control, adjustment of the mA and/or kV according to patient size and/or use of iterative reconstruction technique. COMPARISON:  04/12/2021. FINDINGS: Lower chest: Coronary artery calcifications are noted. There are small bilateral pleural effusions with atelectasis or infiltrate at the lung bases. Hepatobiliary: No focal liver abnormality is seen.  No gallstones, gallbladder wall thickening, or biliary dilatation. Pancreas: No focal abnormality. There is mild dilatation of the pancreatic duct measuring up to 5 mm. No surrounding fat stranding is seen. Spleen: Normal in size without focal abnormality. Adrenals/Urinary Tract: There is thickening of the adrenal glands bilaterally with no discrete nodule. Renal atrophy is noted bilaterally. There is a cyst in the mid right kidney. No renal calculus or hydronephrosis. The bladder is decompressed Stomach/Bowel: Stomach is within normal limits. Appendix appears normal. No evidence of bowel wall thickening, distention, or inflammatory changes. No free air or pneumatosis. Air-fluid levels are noted in the colon, compatible with history of diarrhea illness. Vascular/Lymphatic: Aortic atherosclerosis. No enlarged abdominal or pelvic lymph nodes. Reproductive: Status post hysterectomy. No adnexal masses. Other: No abdominopelvic ascites. Musculoskeletal: Degenerative changes are present in the thoracolumbar spine. No acute osseous abnormality. IMPRESSION: 1. Small bilateral pleural effusions with atelectasis or infiltrate at the lung bases. 2. Air-fluid levels in the colon, compatible with a diarrheal illness. 3. Dilatation of the pancreatic duct measuring up to 5 mm, improved from the prior exam. No surrounding inflammatory changes are seen. 4. Bilateral renal atrophy. 5. Aortic atherosclerosis. Electronically Signed   By: Brett Fairy M.D.   On: 09/03/2022 01:41   DG Chest Portable 1 View  Result Date: 09/02/2022 CLINICAL DATA:  Shortness of breath EXAM: PORTABLE CHEST 1 VIEW COMPARISON:  02/07/2021 FINDINGS: Cardiac shadow is mildly prominent. Aortic calcifications are seen. The lungs are well aerated bilaterally. Left basilar atelectasis/infiltrate with small effusion is seen. Vascular stenting is noted in the left upper arm. No bony abnormality is seen. IMPRESSION: Left basilar atelectasis/early infiltrate with  small effusion. Electronically Signed   By: Inez Catalina M.D.   On: 09/02/2022 23:29    Dialysis Orders: Center:  [facility]  on MWF . 160NRe 3 hr 30 min BFR 350 DFR 500 EDW 51kg (just lowered) 2K 2Ca AVF 15g No heparin Mircera 152mcg IV q 2 weeks- last dose 08/29/22 Venofer 100mg  IV q HD- stop date 09/12/22 Hectorol 65mcg IV q HD  Assessment/Plan:  Respiratory failure/pneumonia: On rocephin and azithromycin. May be volume overloaded as well, see below.  Abdominal pain/diarrhea: per primary team, ordered GI pathogen panel  ESRD:  Dialysis on MWF schedule, will plan for HD today. Please renally dose medications.   Hypertension/volume: Has been losing weight due to GI illness. BP elevated today. Fluid overload may be contributing to her SOB- will attempt higher UF goal today and lower EDW.   Anemia: Hgb 9.8. Not due for ESA yet. Will hold off on venover for now d/t acute infection  Metabolic bone disease: Calcium is slightly low. Will continue hectorol with HD and follow. Checking phos with next labs.   Nutrition:  Alb 3.7, renal diet as tolerated  Anice Paganini, PA-C 09/03/2022, 9:20 AM  [facility] Pager: [phone]

## 2022-09-03 NOTE — ED Notes (Signed)
ED TO INPATIENT HANDOFF REPORT  ED Nurse Name and Phone #: Mosie Lukes RN   S Name/Age/Gender Ashley [name] 72 y.o. female Room/Bed: 023C/023C  Code Status   Code Status: Full Code  Home/SNF/Other Home Patient oriented to: self, place, time, and situation Is this baseline? Yes   Triage Complete: Triage complete  Chief Complaint Acute respiratory failure with hypoxia ([place]) [J96.01]  Triage Note Pt states she has been hurting in her abd for the past 2 weeks but was trying to hold off from coming to the doctor. Pt states this feels like her usual pancreatitis flair. Pt also endorses diarrhea. Denies chest pain, but shortness of breath. Pt is a dialysis patient,   Allergies Allergies  Allergen Reactions   Aspirin Nausea And Vomiting    Stomach ache   Ibuprofen Nausea And Vomiting    Stomach ache    Level of Care/Admitting Diagnosis ED Disposition     ED Disposition  Admit   Condition  --   [place]: [place] [id number]  Level of Care: Med-Surg [16]  May place patient in observation at [facility] or [place] if equivalent level of care is available:: No  Covid Evaluation: Asymptomatic - no recent exposure (last 10 days) testing not required  Diagnosis: Acute respiratory failure with hypoxia [id number]  Admitting Physician: Vianne Bulls [id number]  Attending Physician: Vianne Bulls [id number]          B Medical/Surgery History Past Medical History:  Diagnosis Date   Acute pancreatitis 2000   2000, 12/2018, 08/2019   Arthritis    Cervical radiculopathy 02/28/2011   Cocaine substance abuse ([place]) 05/26/2013   positive UDS    Duodenitis    Erosive gastropathy    ESRD on hemodialysis (HCC)    TTS   GERD (gastroesophageal reflux disease)    Hepatitis C 1987   dt hx IVDA.  genotype 2B.  Epclusa started early 04/2020.     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  Chronic microvascular ischemic changes w parenchymal volume loss. Chronic white matter periventricular microhemorrhage, likely due to htn   Ulcer 1990   gastric ulcer. Ruptured s/p emergency repair   Past Surgical History:  Procedure Laterality Date   ABDOMINAL HYSTERECTOMY  1979   AV FISTULA PLACEMENT Left 06/16/2017   Procedure: ARTERIOVENOUS (AV) FISTULA CREATION LEFT ARM;  Surgeon: Conrad Kingston, MD;  Location: [place];  Service: Vascular;  Laterality: Left;   [place] Left 10/02/2017   Procedure: BASILIC VEIN TRANSPOSITION SECOND STAGE LEFT ARM;  Surgeon: Rosetta Posner, MD;  Location: [place];  Service: Vascular;  Laterality: Left;   BIOPSY  09/06/2019   Procedure: BIOPSY;  Surgeon: Gatha Mayer, MD;  Location: [facility] ENDOSCOPY;  Service: Endoscopy;;   ESOPHAGOGASTRODUODENOSCOPY N/A 05/29/2013   Procedure: ESOPHAGOGASTRODUODENOSCOPY (EGD);  Surgeon: Jerene Bears, MD;  Location: [facility] ENDOSCOPY;  Service: Endoscopy;  Laterality: N/A;   ESOPHAGOGASTRODUODENOSCOPY  05/2013   for epigastric pain.  Nonobstructing Schatzki ring at GEJ, mild gastropathy, nonbleeding AVMs in bulb and D2. 5 mm sessile polyp in bulb.   ESOPHAGOGASTRODUODENOSCOPY (EGD) WITH PROPOFOL N/A 09/06/2019   Procedure: ESOPHAGOGASTRODUODENOSCOPY (EGD) WITH  PROPOFOL;  Surgeon: Gatha Mayer, MD;  Location: [facility] ENDOSCOPY;  Service: Endoscopy;  Laterality: N/A;   ESOPHAGOGASTRODUODENOSCOPY (EGD) WITH PROPOFOL N/A 02/02/2020   Procedure: ESOPHAGOGASTRODUODENOSCOPY (EGD) WITH PROPOFOL;  Surgeon: [name], MD;  Location: [facility] ENDOSCOPY;  Service: Endoscopy;  Laterality: N/A;   ESOPHAGOGASTRODUODENOSCOPY (EGD) WITH PROPOFOL N/A 02/07/2021   Procedure: ESOPHAGOGASTRODUODENOSCOPY (EGD) WITH PROPOFOL;  Surgeon: [name], MD;  Location: [facility] ENDOSCOPY;  Service: Endoscopy;  Laterality: N/A;   EUS N/A 02/02/2020   Procedure: UPPER ENDOSCOPIC ULTRASOUND (EUS) RADIAL;  Surgeon: [name], MD;  Location: [facility] ENDOSCOPY;  Service: Endoscopy;  Laterality: N/A;   EXCHANGE OF A DIALYSIS CATHETER Left 07/31/2017   Procedure: Removal  OF A  Right GroinTUNNELED  DIALYSIS CATHETER ,  Insertion of Left Femoral Dialysis Catheter.;  Surgeon: Rosetta Posner, MD;  Location: [place];  Service: Vascular;  Laterality: Left;   HEMOSTASIS CLIP PLACEMENT  02/07/2021   Procedure: HEMOSTASIS CLIP PLACEMENT;  Surgeon: [name], MD;  Location: [place];  Service: Endoscopy;;   HOT HEMOSTASIS N/A 09/06/2019   Procedure: HOT HEMOSTASIS (ARGON PLASMA COAGULATION/BICAP);  Surgeon: Gatha Mayer, MD;  Location: [facility] ENDOSCOPY;  Service: Endoscopy;  Laterality: N/A;   HOT HEMOSTASIS N/A 02/07/2021   Procedure: HOT HEMOSTASIS (ARGON PLASMA COAGULATION/BICAP);  Surgeon: [name], MD;  Location: [facility] ENDOSCOPY;  Service: Endoscopy;  Laterality: N/A;   INSERTION OF DIALYSIS CATHETER Right 06/16/2017   Procedure: INSERTION OF DIALYSIS CATHETER;  Surgeon: Conrad [place], MD;  Location: [place];  Service: Vascular;  Laterality: Right;   IR AV DIALY SHUNT INTRO NEEDLE/INTRACATH INITIAL W/PTA/IMG LEFT  06/21/2018   REPAIR OF PERFORATED ULCER  1990   gastric ulcer     A IV Location/Drains/Wounds Patient Lines/Drains/Airways Status     Active Line/Drains/Airways     Name Placement date Placement time Site Days   Peripheral IV 09/02/22 20 G 1" Posterior;Right Hand 09/02/22  2215  Hand  1   Fistula / Graft Left Upper arm Arteriovenous fistula --  --  Upper arm  --            Intake/Output Last 24 hours No intake or output data in the 24 hours ending 09/03/22 0308  Labs/Imaging Results for orders placed or performed during the hospital encounter of 09/02/22 (from the past 48 hour(s))  Lipase, blood     Status: Abnormal    Collection Time: 09/02/22 10:12 PM  Result Value Ref Range   Lipase 59 (H) 11 - 51 U/L    Comment: Performed at [facility] [address] [place], [place] [address]  Comprehensive metabolic panel     Status: Abnormal   Collection Time: 09/02/22 10:12 PM  Result Value Ref Range   Sodium 140 135 - 145 mmol/L   Potassium 3.8 3.5 - 5.1 mmol/L   Chloride 94 (L) 98 - 111 mmol/L   CO2 29 22 - 32 mmol/L   Glucose, Bld 84 70 - 99 mg/dL    Comment: Glucose reference range applies only to samples taken after fasting for at least 8 hours.   BUN 13 8 - 23 mg/dL   Creatinine, Ser 7.33 (H) 0.44 - 1.00 mg/dL   Calcium 8.9 8.9 - 10.3 mg/dL   Total Protein 7.3 6.5 - 8.1 g/dL   Albumin 3.7 3.5 - 5.0 g/dL   AST 22 15 - 41 U/L   ALT 14 0 - 44 U/L   Alkaline Phosphatase 55  38 - 126 U/L   Total Bilirubin 0.9 0.3 - 1.2 mg/dL   GFR, Estimated 5 (L) >60 mL/min    Comment: (NOTE) Calculated using the CKD-EPI Creatinine Equation (2021)    Anion gap 17 (H) 5 - 15    Comment: Performed at [facility] [address], [place], [place] [address]  CBC     Status: Abnormal   Collection Time: 09/02/22 10:12 PM  Result Value Ref Range   WBC 4.0 4.0 - 10.5 K/uL   RBC 3.26 (L) 3.87 - 5.11 MIL/uL   Hemoglobin 10.0 (L) 12.0 - 15.0 g/dL   HCT 30.2 (L) 36.0 - 46.0 %   MCV 92.6 80.0 - 100.0 fL   MCH 30.7 26.0 - 34.0 pg   MCHC 33.1 30.0 - 36.0 g/dL   RDW 21.2 (H) 11.5 - 15.5 %   Platelets 339 150 - 400 K/uL   nRBC 0.0 0.0 - 0.2 %    Comment: Performed at [facility] [address] [place], [place] [address]  Troponin I (High Sensitivity)     Status: None   Collection Time: 09/02/22 10:12 PM  Result Value Ref Range   Troponin I (High Sensitivity) 11 <18 ng/L    Comment: (NOTE) Elevated high sensitivity troponin I (hsTnI) values and significant  changes across serial measurements may suggest ACS but many other  chronic and acute conditions are known to elevate hsTnI results.  Refer to  the "Links" section for chest pain algorithms and additional  guidance. Performed at [facility], [place] [address], [place], [place] [address]   Troponin I (High Sensitivity)     Status: None   Collection Time: 09/03/22 12:59 AM  Result Value Ref Range   Troponin I (High Sensitivity) 12 <18 ng/L    Comment: (NOTE) Elevated high sensitivity troponin I (hsTnI) values and significant  changes across serial measurements may suggest ACS but many other  chronic and acute conditions are known to elevate hsTnI results.  Refer to the "Links" section for chest pain algorithms and additional  guidance. Performed at [facility], [place] [address], [place], [place] [address]    CT ABDOMEN PELVIS WO CONTRAST  Result Date: 09/03/2022 CLINICAL DATA:  Abdominal pain for 2 weeks, concern for pancreatitis. Diarrhea. EXAM: CT ABDOMEN AND PELVIS WITHOUT CONTRAST TECHNIQUE: Multidetector CT imaging of the abdomen and pelvis was performed following the standard protocol without IV contrast. RADIATION DOSE REDUCTION: This exam was performed according to the departmental dose-optimization program which includes automated exposure control, adjustment of the mA and/or kV according to patient size and/or use of iterative reconstruction technique. COMPARISON:  04/12/2021. FINDINGS: Lower chest: Coronary artery calcifications are noted. There are small bilateral pleural effusions with atelectasis or infiltrate at the lung bases. Hepatobiliary: No focal liver abnormality is seen. No gallstones, gallbladder wall thickening, or biliary dilatation. Pancreas: No focal abnormality. There is mild dilatation of the pancreatic duct measuring up to 5 mm. No surrounding fat stranding is seen. Spleen: Normal in size without focal abnormality. Adrenals/Urinary Tract: There is thickening of the adrenal glands bilaterally with no discrete nodule. Renal atrophy is noted bilaterally. There is a cyst in the mid right kidney. No renal  calculus or hydronephrosis. The bladder is decompressed Stomach/Bowel: Stomach is within normal limits. Appendix appears normal. No evidence of bowel wall thickening, distention, or inflammatory changes. No free air or pneumatosis. Air-fluid levels are noted in the colon, compatible with history of diarrhea  illness. Vascular/Lymphatic: Aortic atherosclerosis. No enlarged abdominal or pelvic lymph nodes. Reproductive: Status post hysterectomy. No adnexal masses. Other: No abdominopelvic ascites. Musculoskeletal: Degenerative changes are present in the thoracolumbar spine. No acute osseous abnormality. IMPRESSION: 1. Small bilateral pleural effusions with atelectasis or infiltrate at the lung bases. 2. Air-fluid levels in the colon, compatible with a diarrheal illness. 3. Dilatation of the pancreatic duct measuring up to 5 mm, improved from the prior exam. No surrounding inflammatory changes are seen. 4. Bilateral renal atrophy. 5. Aortic atherosclerosis. Electronically Signed   By: Brett Fairy M.D.   On: 09/03/2022 01:41   DG Chest Portable 1 View  Result Date: 09/02/2022 CLINICAL DATA:  Shortness of breath EXAM: PORTABLE CHEST 1 VIEW COMPARISON:  02/07/2021 FINDINGS: Cardiac shadow is mildly prominent. Aortic calcifications are seen. The lungs are well aerated bilaterally. Left basilar atelectasis/infiltrate with small effusion is seen. Vascular stenting is noted in the left upper arm. No bony abnormality is seen. IMPRESSION: Left basilar atelectasis/early infiltrate with small effusion. Electronically Signed   By: Inez Catalina M.D.   On: 09/02/2022 23:29    Pending Labs Unresulted Labs (From admission, onward)     Start     Ordered   09/03/22 [id number]  Basic metabolic panel  Daily,   R      09/03/22 0252   09/03/22 0500  CBC  Daily,   R      09/03/22 0252   09/03/22 0253  Procalcitonin  Daily,   R     References:    Procalcitonin Lower Respiratory Tract Infection AND Sepsis Procalcitonin Algorithm    09/03/22 0252   09/03/22 0253  Gastrointestinal Panel by PCR , Stool  (Gastrointestinal Panel by PCR, Stool                                                                                                                                                     **Does Not include CLOSTRIDIUM DIFFICILE testing. **If CDIFF testing is needed, place order from the "C Difficile Testing" order set.**)  Once,   R        09/03/22 0253   09/03/22 0253  C Difficile Quick Screen w PCR reflex  (C Difficile quick screen w PCR reflex panel )  Once, for 24 hours,   TIMED       References:    CDiff Information Tool   09/03/22 0253   09/03/22 0252  Legionella Pneumophila Serogp 1 Ur Ag  (COPD / Pneumonia / Cellulitis / Lower Extremity Wound)  Once,   R        09/03/22 0252   09/03/22 0252  Strep pneumoniae urinary antigen  (COPD / Pneumonia / Cellulitis / Lower Extremity Wound)  Once,   R        09/03/22 [id number]  Vitals/Pain Today's Vitals   09/03/22 0215 09/03/22 0230 09/03/22 0245 09/03/22 0300  BP: (!) 162/80 (!) 161/79 (!) 161/78 (!) 175/68  Pulse: 88 81 82 94  Resp: 15 14 14 20   Temp:      TempSrc:      SpO2: 100% 100% 100% 100%  Weight:      Height:      PainSc:        Isolation Precautions Enteric precautions (UV disinfection)  Medications Medications  azithromycin (ZITHROMAX) 500 mg in sodium chloride 0.9 % 250 mL IVPB (500 mg Intravenous New Bag/Given 09/03/22 0303)  amLODipine (NORVASC) tablet 10 mg (has no administration in time range)  atorvastatin (LIPITOR) tablet 20 mg (has no administration in time range)  hydrALAZINE (APRESOLINE) tablet 75 mg (has no administration in time range)  cinacalcet (SENSIPAR) tablet 60 mg (has no administration in time range)  pantoprazole (PROTONIX) EC tablet 40 mg (has no administration in time range)  sevelamer carbonate (RENVELA) tablet 800 mg (has no administration in time range)  heparin injection 5,000 Units (has no administration in time  range)  acetaminophen (TYLENOL) tablet 650 mg (has no administration in time range)    Or  acetaminophen (TYLENOL) suppository 650 mg (has no administration in time range)  oxyCODONE (Oxy IR/ROXICODONE) immediate release tablet 5 mg (has no administration in time range)  HYDROmorphone (DILAUDID) injection 0.5 mg (has no administration in time range)  cefTRIAXone (ROCEPHIN) 2 g in sodium chloride 0.9 % 100 mL IVPB (2 g Intravenous New Bag/Given 09/03/22 0305)  azithromycin (ZITHROMAX) 500 mg in sodium chloride 0.9 % 250 mL IVPB (has no administration in time range)  oxyCODONE-acetaminophen (PERCOCET/ROXICET) 5-325 MG per tablet 2 tablet (2 tablets Oral Given 09/02/22 2220)  ondansetron (ZOFRAN) injection 4 mg (4 mg Intravenous Given 09/02/22 2220)  HYDROmorphone (DILAUDID) injection 0.5 mg (0.5 mg Intravenous Given 09/02/22 2321)  HYDROmorphone (DILAUDID) injection 1 mg (1 mg Intravenous Given 09/03/22 0258)    Mobility walks with device     Focused Assessments C/O abdominal pain, Bowel sounds present x 4, reports recurrent pancreatitis.    R Recommendations: See Admitting Provider Note  Report given to:   Additional Notes:

## 2022-09-03 NOTE — H&P (Signed)
History and Physical    GLEMA [name] [id number] DOB: 01/01/1951 DOA: 09/02/2022  PCP: Sonia Side., FNP   Patient coming from: home   Chief Complaint: Abdominal pain, diarrhea, cough, SOB   HPI: Ashley [name] is a pleasant 72 y.o. female with medical history significant for ESRD on hemodialysis, CVA, hypertension, substance abuse, and pancreatitis who presents emergency department with multiple complaints including abdominal pain, diarrhea, cough, and shortness of breath.  Patient reports approximately 2 weeks of abdominal pain and diarrhea.  She has not been eating as much recently in an attempt to control the diarrhea.  She denies any melena or hematochezia, and denies vomiting.  She has not checked her temperature but does not believe she has been febrile.  She also complains of 3 to 4 weeks of productive cough and worsening shortness of breath.  Patient states that she completed dialysis on 09/01/2022 but required supplemental oxygen during that visit.    ED Course: Upon arrival to the ED, patient is found to be afebrile and saturating in the 80s on room air with normal heart rate and elevated blood pressures.  Chest x-ray notable for left basilar atelectasis or early infiltrate with small effusion.  CT of the abdomen and pelvis demonstrates small bilateral pleural effusions with atelectasis or infiltrate in the bases, air-fluid level in the colon, and improved pancreatic duct dilatation without surrounding inflammatory changes.  Labs are most notable for normal potassium, normal bicarbonate, normal BUN, normal WBC, and normal troponin x 2.  Patient was treated in the emergency department with Percocet, Dilaudid, Zofran, Rocephin, and azithromycin.  Review of Systems:  All other systems reviewed and apart from HPI, are negative.  Past Medical History:  Diagnosis Date   Acute pancreatitis 2000   2000, 12/2018, 08/2019   Arthritis    Cervical radiculopathy 02/28/2011   Cocaine  substance abuse ([place]) 05/26/2013   positive UDS    Duodenitis    Erosive gastropathy    ESRD on hemodialysis (HCC)    TTS   GERD (gastroesophageal reflux disease)    Hepatitis C 1987   dt hx IVDA.  genotype 2B.  Epclusa started early 04/2020.     Hiatal hernia    Hyperlipidemia 2015   Hypertension 2008   Marijuana abuse 05/27/2003   positive UDS, family members smoke as well   Pancreatitis    Progressive focal motor weakness 06/14/2017   Schatzki's ring    Stroke ([place]) 06/2017   MRI:MRI: small, subacute left internal capsule infarct.  Chronic microvascular ischemic changes w parenchymal volume loss. Chronic white matter periventricular microhemorrhage, likely due to htn   Ulcer 1990   gastric ulcer. Ruptured s/p emergency repair    Past Surgical History:  Procedure Laterality Date   ABDOMINAL HYSTERECTOMY  1979   AV FISTULA PLACEMENT Left 06/16/2017   Procedure: ARTERIOVENOUS (AV) FISTULA CREATION LEFT ARM;  Surgeon: Conrad McGrew, MD;  Location: [place];  Service: Vascular;  Laterality: Left;   [place] Left 10/02/2017   Procedure: BASILIC VEIN TRANSPOSITION SECOND STAGE LEFT ARM;  Surgeon: Rosetta Posner, MD;  Location: [place];  Service: Vascular;  Laterality: Left;   BIOPSY  09/06/2019   Procedure: BIOPSY;  Surgeon: Gatha Mayer, MD;  Location: [facility] ENDOSCOPY;  Service: Endoscopy;;   ESOPHAGOGASTRODUODENOSCOPY N/A 05/29/2013   Procedure: ESOPHAGOGASTRODUODENOSCOPY (EGD);  Surgeon: Jerene Bears, MD;  Location: [facility] ENDOSCOPY;  Service: Endoscopy;  Laterality: N/A;   ESOPHAGOGASTRODUODENOSCOPY  05/2013   for  epigastric pain.  Nonobstructing Schatzki ring at GEJ, mild gastropathy, nonbleeding AVMs in bulb and D2. 5 mm sessile polyp in bulb.   ESOPHAGOGASTRODUODENOSCOPY (EGD) WITH PROPOFOL N/A 09/06/2019   Procedure: ESOPHAGOGASTRODUODENOSCOPY (EGD) WITH PROPOFOL;  Surgeon: Gatha Mayer, MD;  Location: [place];  Service: Endoscopy;  Laterality: N/A;    ESOPHAGOGASTRODUODENOSCOPY (EGD) WITH PROPOFOL N/A 02/02/2020   Procedure: ESOPHAGOGASTRODUODENOSCOPY (EGD) WITH PROPOFOL;  Surgeon: [name], MD;  Location: [facility] ENDOSCOPY;  Service: Endoscopy;  Laterality: N/A;   ESOPHAGOGASTRODUODENOSCOPY (EGD) WITH PROPOFOL N/A 02/07/2021   Procedure: ESOPHAGOGASTRODUODENOSCOPY (EGD) WITH PROPOFOL;  Surgeon: [name], MD;  Location: [facility] ENDOSCOPY;  Service: Endoscopy;  Laterality: N/A;   EUS N/A 02/02/2020   Procedure: UPPER ENDOSCOPIC ULTRASOUND (EUS) RADIAL;  Surgeon: [name], MD;  Location: [facility] ENDOSCOPY;  Service: Endoscopy;  Laterality: N/A;   EXCHANGE OF A DIALYSIS CATHETER Left 07/31/2017   Procedure: Removal  OF A  Right GroinTUNNELED  DIALYSIS CATHETER ,  Insertion of Left Femoral Dialysis Catheter.;  Surgeon: Rosetta Posner, MD;  Location: [place];  Service: Vascular;  Laterality: Left;   HEMOSTASIS CLIP PLACEMENT  02/07/2021   Procedure: HEMOSTASIS CLIP PLACEMENT;  Surgeon: [name], MD;  Location: [place];  Service: Endoscopy;;   HOT HEMOSTASIS N/A 09/06/2019   Procedure: HOT HEMOSTASIS (ARGON PLASMA COAGULATION/BICAP);  Surgeon: Gatha Mayer, MD;  Location: [facility] ENDOSCOPY;  Service: Endoscopy;  Laterality: N/A;   HOT HEMOSTASIS N/A 02/07/2021   Procedure: HOT HEMOSTASIS (ARGON PLASMA COAGULATION/BICAP);  Surgeon: [name], MD;  Location: [facility] ENDOSCOPY;  Service: Endoscopy;  Laterality: N/A;   INSERTION OF DIALYSIS CATHETER Right 06/16/2017   Procedure: INSERTION OF DIALYSIS CATHETER;  Surgeon: Conrad [place], MD;  Location: [place];  Service: Vascular;  Laterality: Right;   IR AV DIALY SHUNT INTRO NEEDLE/INTRACATH INITIAL W/PTA/IMG LEFT  06/21/2018   REPAIR OF PERFORATED ULCER  1990   gastric ulcer    Social History:   reports that she has been smoking cigarettes. She has a 10.00 pack-year smoking history. She has never used smokeless tobacco. She reports that she does not currently use drugs after having used the  following drugs: Heroin, Marijuana, and Cocaine. She reports that she does not drink alcohol.  Allergies  Allergen Reactions   Aspirin Nausea And Vomiting    Stomach ache   Ibuprofen Nausea And Vomiting    Stomach ache    Family History  Problem Relation Age of Onset   Hypertension Father    Cancer Father    Hyperlipidemia Father    Seizures Sister    Early death Daughter    Kidney disease Daughter        end stage dialysis dependent      Prior to Admission medications   Medication Sig Start Date End Date Taking? Authorizing Provider  acetaminophen (TYLENOL) 325 MG tablet Take 2 tablets (650 mg total) by mouth every 6 (six) hours as needed for mild pain (or Fever >/= 101). Patient not taking: Reported on 04/13/2021 01/11/20   Little Ishikawa, MD  amLODipine (NORVASC) 10 MG tablet Take 1 tablet (10 mg total) by mouth daily. Patient taking differently: Take 10 mg by mouth daily at 12 noon. 11/11/16   Katheren Shams, DO  atorvastatin (LIPITOR) 20 MG tablet Take 20 mg by mouth at bedtime. 08/05/20   [provider]  B Complex-C-Zn-Folic Acid (DIALYVITE Q000111Q WITH ZINC) 0.8 MG TABS Take 1 tablet by mouth daily.  07/07/19  [provider]  cinacalcet (SENSIPAR) 60 MG tablet Take 60 mg by mouth every evening. 03/23/21   [provider]  cloNIDine (CATAPRES) 0.1 MG tablet Take 0.1 mg by mouth 2 (two) times daily. 06/25/19   [provider]  hydrALAZINE (APRESOLINE) 25 MG tablet Take 3 tablets (75 mg total) by mouth every 8 (eight) hours. 11/14/20 02/12/21  [place] Donnamarie Poag, DO  lidocaine-prilocaine (EMLA) cream Apply 1 application topically See admin instructions. Apply small amount to access site on Tuesday, Thursday, Saturday one hour before dialysis. Cover with occlusive dressing (saran wrap) 08/25/19   [provider]  metoprolol succinate (TOPROL-XL) 25 MG 24 hr tablet Take 0.5 tablets (12.5 mg total) by mouth daily. 11/14/20 04/13/21  [place] Eric J,  DO  ondansetron (ZOFRAN ODT) 4 MG disintegrating tablet Take 1 tablet (4 mg total) by mouth every 8 (eight) hours as needed for nausea or vomiting. 04/10/21   Sherwood Gambler, MD  ondansetron (ZOFRAN) 4 MG tablet Take 1 tablet (4 mg total) by mouth every 6 (six) hours. 05/12/21   Tegeler, Gwenyth Allegra, MD  oxyCODONE-acetaminophen (PERCOCET/ROXICET) 5-325 MG tablet Take 1 tablet by mouth every 6 (six) hours as needed for severe pain. 05/12/21   Tegeler, Gwenyth Allegra, MD  pantoprazole (PROTONIX) 40 MG tablet Take 1 tablet (40 mg total) by mouth daily. 11/14/20 04/13/21  [place] Eric J, DO  senna-docusate (SENOKOT-S) 8.6-50 MG tablet Take 1 tablet by mouth at bedtime as needed for mild constipation. 04/16/21   Regalado, Belkys A, MD  sevelamer carbonate (RENVELA) 800 MG tablet Take 800 mg by mouth 3 (three) times daily with meals. 07/23/20   [provider]    Physical Exam: Vitals:   09/02/22 2158 09/02/22 2201 09/02/22 2330 09/02/22 2346  BP:  (!) 169/82 (!) 159/77 (!) 162/90  Pulse:  94 92 83  Resp:  17 12 13   Temp:  98.8 F (37.1 C)  98.7 F (37.1 C)  TempSrc:  Oral  Oral  SpO2:  100% (!) 81% 100%  Weight: 54.4 kg     Height: 5\' 2"  (1.575 m)       Constitutional: NAD, calm  Eyes: PERTLA, lids and conjunctivae normal ENMT: Mucous membranes are moist. Posterior pharynx clear of any exudate or lesions.   Neck: supple, no masses  Respiratory: Rhonchi at bases Lt > Rt, no wheezing. No accessory muscle use.  Cardiovascular: S1 & S2 heard, regular rate and rhythm. No extremity edema.  Abdomen: Soft, tender in mid and upper abdomen without peritoneal signs. Bowel sounds active.  Musculoskeletal: no clubbing / cyanosis. No joint deformity upper and lower extremities.   Skin: no significant rashes, lesions, ulcers. Warm, dry, well-perfused. Neurologic: CN 2-12 grossly intact. Moving all extremities. Alert and oriented.  Psychiatric: Pleasant. Cooperative.    Labs and Imaging on  Admission: I have personally reviewed following labs and imaging studies  CBC: Recent Labs  Lab 09/02/22 2212  WBC 4.0  HGB 10.0*  HCT 30.2*  MCV 92.6  PLT 99991111   Basic Metabolic Panel: Recent Labs  Lab 09/02/22 2212  NA 140  K 3.8  CL 94*  CO2 29  GLUCOSE 84  BUN 13  CREATININE 7.33*  CALCIUM 8.9   GFR: Estimated Creatinine Clearance: 5.4 mL/min (A) (by C-G formula based on SCr of 7.33 mg/dL (H)). Liver Function Tests: Recent Labs  Lab 09/02/22 2212  AST 22  ALT 14  ALKPHOS 55  BILITOT 0.9  PROT 7.3  ALBUMIN 3.7  Recent Labs  Lab 09/02/22 2212  LIPASE 59*   No results for input(s): "AMMONIA" in the last 168 hours. Coagulation Profile: No results for input(s): "INR", "PROTIME" in the last 168 hours. Cardiac Enzymes: No results for input(s): "CKTOTAL", "CKMB", "CKMBINDEX", "TROPONINI" in the last 168 hours. BNP (last 3 results) No results for input(s): "PROBNP" in the last 8760 hours. HbA1C: No results for input(s): "HGBA1C" in the last 72 hours. CBG: No results for input(s): "GLUCAP" in the last 168 hours. Lipid Profile: No results for input(s): "CHOL", "HDL", "LDLCALC", "TRIG", "CHOLHDL", "LDLDIRECT" in the last 72 hours. Thyroid Function Tests: No results for input(s): "TSH", "T4TOTAL", "FREET4", "T3FREE", "THYROIDAB" in the last 72 hours. Anemia Panel: No results for input(s): "VITAMINB12", "FOLATE", "FERRITIN", "TIBC", "IRON", "RETICCTPCT" in the last 72 hours. Urine analysis:    Component Value Date/Time   COLORURINE YELLOW 12/31/2018 1014   APPEARANCEUR CLEAR 12/31/2018 1014   LABSPEC 1.008 12/31/2018 1014   PHURINE 9.0 (H) 12/31/2018 1014   GLUCOSEU NEGATIVE 12/31/2018 1014   HGBUR SMALL (A) 12/31/2018 1014   BILIRUBINUR NEGATIVE 12/31/2018 1014   BILIRUBINUR NEG 01/29/2016 1142   KETONESUR NEGATIVE 12/31/2018 1014   PROTEINUR >=300 (A) 12/31/2018 1014   UROBILINOGEN 0.2 01/29/2016 1142   UROBILINOGEN 0.2 03/21/2015 1649   NITRITE  NEGATIVE 12/31/2018 1014   LEUKOCYTESUR NEGATIVE 12/31/2018 1014   Sepsis Labs: @LABRCNTIP (procalcitonin:4,lacticidven:4) )No results found for this or any previous visit (from the past 240 hour(s)).   Radiological Exams on Admission: CT ABDOMEN PELVIS WO CONTRAST  Result Date: 09/03/2022 CLINICAL DATA:  Abdominal pain for 2 weeks, concern for pancreatitis. Diarrhea. EXAM: CT ABDOMEN AND PELVIS WITHOUT CONTRAST TECHNIQUE: Multidetector CT imaging of the abdomen and pelvis was performed following the standard protocol without IV contrast. RADIATION DOSE REDUCTION: This exam was performed according to the departmental dose-optimization program which includes automated exposure control, adjustment of the mA and/or kV according to patient size and/or use of iterative reconstruction technique. COMPARISON:  04/12/2021. FINDINGS: Lower chest: Coronary artery calcifications are noted. There are small bilateral pleural effusions with atelectasis or infiltrate at the lung bases. Hepatobiliary: No focal liver abnormality is seen. No gallstones, gallbladder wall thickening, or biliary dilatation. Pancreas: No focal abnormality. There is mild dilatation of the pancreatic duct measuring up to 5 mm. No surrounding fat stranding is seen. Spleen: Normal in size without focal abnormality. Adrenals/Urinary Tract: There is thickening of the adrenal glands bilaterally with no discrete nodule. Renal atrophy is noted bilaterally. There is a cyst in the mid right kidney. No renal calculus or hydronephrosis. The bladder is decompressed Stomach/Bowel: Stomach is within normal limits. Appendix appears normal. No evidence of bowel wall thickening, distention, or inflammatory changes. No free air or pneumatosis. Air-fluid levels are noted in the colon, compatible with history of diarrhea illness. Vascular/Lymphatic: Aortic atherosclerosis. No enlarged abdominal or pelvic lymph nodes. Reproductive: Status post hysterectomy. No adnexal  masses. Other: No abdominopelvic ascites. Musculoskeletal: Degenerative changes are present in the thoracolumbar spine. No acute osseous abnormality. IMPRESSION: 1. Small bilateral pleural effusions with atelectasis or infiltrate at the lung bases. 2. Air-fluid levels in the colon, compatible with a diarrheal illness. 3. Dilatation of the pancreatic duct measuring up to 5 mm, improved from the prior exam. No surrounding inflammatory changes are seen. 4. Bilateral renal atrophy. 5. Aortic atherosclerosis. Electronically Signed   By: Brett Fairy M.D.   On: 09/03/2022 01:41   DG Chest Portable 1 View  Result Date: 09/02/2022 CLINICAL DATA:  Shortness of  breath EXAM: PORTABLE CHEST 1 VIEW COMPARISON:  02/07/2021 FINDINGS: Cardiac shadow is mildly prominent. Aortic calcifications are seen. The lungs are well aerated bilaterally. Left basilar atelectasis/infiltrate with small effusion is seen. Vascular stenting is noted in the left upper arm. No bony abnormality is seen. IMPRESSION: Left basilar atelectasis/early infiltrate with small effusion. Electronically Signed   By: Inez Catalina M.D.   On: 09/02/2022 23:29     Assessment/Plan   1. Pneumonia; acute hypoxic respiratory failure  - Pt reports productive cough and SOB, required supplemental O2 at dialysis on 3/25, has rhonchi on exam, saturating 80s on rm air, and imaging suggestive of pneumonia  - Treated in ED with Rocephin, azithromycin, and supplemental O2  - Continue Rocephin and azithromycin, trend procalcitonin, continue supplemental O2 as needed     2. ESRD  - Reports completing HD on 09/01/22  - No indication for urgent dialysis on admission  - Restrict fluids, renally-dose medications, continue caclimimetic and phos binder, consult nephrology in am for maintenance dialysis    3. Hypertension  - Continue Norvasc and hydralazine    4. Abdominal pain; diarrhea  - Pt reports 2 wks of abdominal pain and diarrhea  - CT notable for air-fluid  levels in colon, no peripancreatic fat-stranding  - Continue pain-control, send stool for GI pathogen panel    DVT prophylaxis: sq heparin   Code Status: Full  Level of Care: Level of care: Med-Surg Family Communication: none present  Disposition Plan:  Patient is from: home  Anticipated d/c is to: Home  Anticipated d/c date is: 3/28 or 09/04/21 Patient currently: Pending improved oxygenation or home O2  Consults called: None  Admission status: Observation     Vianne Bulls, MD [facility]  09/03/2022, 2:53 AM

## 2022-09-03 NOTE — Progress Notes (Signed)
PT Cancellation Note  Patient Details Name: Ashley [name] MRN: [id number] DOB: 1951/01/05   Cancelled Treatment:    Reason Eval/Treat Not Completed: Patient at procedure or test/unavailable (HD)  Wyona Almas, PT, DPT Acute Rehabilitation Services Office [place] 09/03/2022, 2:27 PM

## 2022-09-03 NOTE — Progress Notes (Signed)
PROGRESS NOTE    Ashley [name]  [id number] DOB: [birth year] DOA: 09/02/2022 PCP: Sonia Side., FNP   Brief Narrative: Ashley [name] is a 72 y.o. female with a history of ESRD on hemodialysis, CVA, hypertension, substance abuse, pancreatitis.  Ashley [name] presents secondary to abdominal pain, diarrhea, cough.  Ashley [name] was noted to have a likely diarrheal illness with possible acute pancreatitis as abdomen by abdominal pain and elevated lipase.  Chest x-ray was significant for possible left-sided basilar pneumonia and Ashley [name] started empirically on antibiotics.  Ashley [name] with evidence of hypoxia and was to provided with supplemental oxygen for support.   Assessment and Plan:  Community acquired pneumonia Ashley [name] with hypoxia and slightly productive cough. Procalcitonin is elevated at 0.35. Empiric Ceftriaxone and azithromycin started on admission. -Continue Ceftriaxone/Azithromycin  Hypoxia Ashley [name] without associated dyspnea per report. Chest imaging with left sided basilar atelectasis vs infiltrate with effusion. -Wean to room air -PT/OT eval  ESRD on HD Nephrology consulted for management of HD needs while inpatient.  Primary hypertension -Continue amlodipine and hydralazine  Abdominal pain Diarrhea CT imaging suggestive of diarrheal illness although Ashley [name] states symptoms are similar to prior pancreatitis flares. Symptoms are improved today but still intermittent. GI pathogen and C. Difficile testing ordered. Lipase of 59. No CT imaging evidence of acute pancreatitis. -Continue oral diet for now  History of pancreatitis Ashley [name] does not meet criteria for acute pancreatitis.  Lipase of 59 with no CT evidence of acute pancreatitis.   DVT prophylaxis: Heparin subq Code Status:   Code Status: Full Code Family Communication: None at bedside Disposition Plan: Discharge home likely in 24 hours pending ability to wean oxygen to room air, stable abdominal pain, transition to  oral antibiotics   Consultants:  Nephrology  Procedures:  None  Antimicrobials: Ceftriaxone Azithromycin    Subjective: Ashley [name] reports some mildly productive coughing. Continued abdominal pain similar to pancreatitis flares. No nausea/vomiting/diarrhea.  Objective: BP (!) 141/68 (BP Location: Right Arm)   Pulse 76   Temp 98.1 F (36.7 C) (Oral)   Resp 17   Ht 5\' 2"  (1.575 m)   Wt 54.4 kg   SpO2 100%   BMI 21.95 kg/m   Examination:  General exam: Appears calm and comfortable Respiratory system: Rhonchi. Respiratory effort normal. Cardiovascular system: S1 & S2 heard, RRR. Gastrointestinal system: Abdomen is nondistended, soft and tender. Normal bowel sounds heard. Central nervous system: Alert and oriented. No focal neurological deficits. Musculoskeletal: No edema. No calf tenderness Skin: No cyanosis. No rashes Psychiatry: Judgement and insight appear normal. Mood & affect appropriate.    Data Reviewed: I have personally reviewed following labs and imaging studies  CBC Lab Results  Component Value Date   WBC 3.5 (L) 09/03/2022   RBC 3.27 (L) 09/03/2022   HGB 9.8 (L) 09/03/2022   HCT 30.2 (L) 09/03/2022   MCV 92.4 09/03/2022   MCH 30.0 09/03/2022   PLT 146 (L) 09/03/2022   MCHC 32.5 09/03/2022   RDW 20.9 (H) 09/03/2022   LYMPHSABS 1.9 02/05/2021   MONOABS 0.8 02/05/2021   EOSABS 0.2 02/05/2021   BASOSABS 0.0 123456     Last metabolic panel Lab Results  Component Value Date   NA 139 09/03/2022   K 4.4 09/03/2022   CL 98 09/03/2022   CO2 25 09/03/2022   BUN 15 09/03/2022   CREATININE 7.72 (H) 09/03/2022   GLUCOSE 88 09/03/2022   GFRNONAA 5 (L) 09/03/2022   GFRAA 8 (L) 01/11/2020   CALCIUM 7.8 (L)  09/03/2022   PHOS 4.8 (H) 04/13/2021   PROT 7.3 09/02/2022   ALBUMIN 3.7 09/02/2022   LABGLOB 3.1 04/02/2018   AGRATIO 1.4 04/02/2018   BILITOT 0.9 09/02/2022   ALKPHOS 55 09/02/2022   AST 22 09/02/2022   ALT 14 09/02/2022   ANIONGAP 16  (H) 09/03/2022    GFR: Estimated Creatinine Clearance: 5.1 mL/min (A) (by C-G formula based on SCr of 7.72 mg/dL (H)).  No results found for this or any previous visit (from the past 240 hour(s)).    Radiology Studies: CT ABDOMEN PELVIS WO CONTRAST  Result Date: 09/03/2022 CLINICAL DATA:  Abdominal pain for 2 weeks, concern for pancreatitis. Diarrhea. EXAM: CT ABDOMEN AND PELVIS WITHOUT CONTRAST TECHNIQUE: Multidetector CT imaging of the abdomen and pelvis was performed following the standard protocol without IV contrast. RADIATION DOSE REDUCTION: This exam was performed according to the departmental dose-optimization program which includes automated exposure control, adjustment of the mA and/or kV according to Ashley [name] size and/or use of iterative reconstruction technique. COMPARISON:  04/12/2021. FINDINGS: Lower chest: Coronary artery calcifications are noted. There are small bilateral pleural effusions with atelectasis or infiltrate at the lung bases. Hepatobiliary: No focal liver abnormality is seen. No gallstones, gallbladder wall thickening, or biliary dilatation. Pancreas: No focal abnormality. There is mild dilatation of the pancreatic duct measuring up to 5 mm. No surrounding fat stranding is seen. Spleen: Normal in size without focal abnormality. Adrenals/Urinary Tract: There is thickening of the adrenal glands bilaterally with no discrete nodule. Renal atrophy is noted bilaterally. There is a cyst in the mid right kidney. No renal calculus or hydronephrosis. The bladder is decompressed Stomach/Bowel: Stomach is within normal limits. Appendix appears normal. No evidence of bowel wall thickening, distention, or inflammatory changes. No free air or pneumatosis. Air-fluid levels are noted in the colon, compatible with history of diarrhea illness. Vascular/Lymphatic: Aortic atherosclerosis. No enlarged abdominal or pelvic lymph nodes. Reproductive: Status post hysterectomy. No adnexal masses. Other:  No abdominopelvic ascites. Musculoskeletal: Degenerative changes are present in the thoracolumbar spine. No acute osseous abnormality. IMPRESSION: 1. Small bilateral pleural effusions with atelectasis or infiltrate at the lung bases. 2. Air-fluid levels in the colon, compatible with a diarrheal illness. 3. Dilatation of the pancreatic duct measuring up to 5 mm, improved from the prior exam. No surrounding inflammatory changes are seen. 4. Bilateral renal atrophy. 5. Aortic atherosclerosis. Electronically Signed   By: Brett Fairy M.D.   On: 09/03/2022 01:41   DG Chest Portable 1 View  Result Date: 09/02/2022 CLINICAL DATA:  Shortness of breath EXAM: PORTABLE CHEST 1 VIEW COMPARISON:  02/07/2021 FINDINGS: Cardiac shadow is mildly prominent. Aortic calcifications are seen. The lungs are well aerated bilaterally. Left basilar atelectasis/infiltrate with small effusion is seen. Vascular stenting is noted in the left upper arm. No bony abnormality is seen. IMPRESSION: Left basilar atelectasis/early infiltrate with small effusion. Electronically Signed   By: Inez Catalina M.D.   On: 09/02/2022 23:29      LOS: 0 days    Cordelia Poche, MD [facility] 09/03/2022, 1:05 PM   If 7PM-7AM, please contact night-coverage [email or web address]

## 2022-09-03 NOTE — Hospital Course (Addendum)
Ashley [name] is a 72 y.o. female with a history of ESRD on hemodialysis, CVA, hypertension, substance abuse, pancreatitis.  Patient presents secondary to abdominal pain, diarrhea, cough.  Patient was noted to have a likely diarrheal illness with possible acute pancreatitis as abdomen by abdominal pain and elevated lipase.  Chest x-ray was significant for possible left-sided basilar pneumonia and patient started empirically on antibiotics.  Patient with evidence of hypoxia and was to provided with supplemental oxygen for support. Hospitalization complicated by acute pancreatitis which improved with bowel rest and slowly advancing diet. Discharge to complete antibiotics.

## 2022-09-04 DIAGNOSIS — K859 Acute pancreatitis without necrosis or infection, unspecified: Secondary | ICD-10-CM

## 2022-09-04 DIAGNOSIS — N186 End stage renal disease: Secondary | ICD-10-CM | POA: Diagnosis not present

## 2022-09-04 DIAGNOSIS — Z8673 Personal history of transient ischemic attack (TIA), and cerebral infarction without residual deficits: Secondary | ICD-10-CM | POA: Diagnosis not present

## 2022-09-04 DIAGNOSIS — J9601 Acute respiratory failure with hypoxia: Secondary | ICD-10-CM | POA: Diagnosis not present

## 2022-09-04 DIAGNOSIS — I10 Essential (primary) hypertension: Secondary | ICD-10-CM | POA: Diagnosis not present

## 2022-09-04 LAB — BASIC METABOLIC PANEL
Anion gap: 16 — ABNORMAL HIGH (ref 5–15)
BUN: 10 mg/dL (ref 8–23)
CO2: 27 mmol/L (ref 22–32)
Calcium: 8.4 mg/dL — ABNORMAL LOW (ref 8.9–10.3)
Chloride: 95 mmol/L — ABNORMAL LOW (ref 98–111)
Creatinine, Ser: 5.76 mg/dL — ABNORMAL HIGH (ref 0.44–1.00)
GFR, Estimated: 7 mL/min — ABNORMAL LOW (ref >60)
Glucose, Bld: 94 mg/dL (ref 70–99)
Potassium: 4.3 mmol/L (ref 3.5–5.1)
Sodium: 138 mmol/L (ref 135–145)

## 2022-09-04 LAB — CBC
HCT: 30.3 % — ABNORMAL LOW (ref 36.0–46.0)
Hemoglobin: 9.9 g/dL — ABNORMAL LOW (ref 12.0–15.0)
MCH: 30.9 pg (ref 26.0–34.0)
MCHC: 32.7 g/dL (ref 30.0–36.0)
MCV: 94.7 fL (ref 80.0–100.0)
Platelets: 333 10*3/uL (ref 150–400)
RBC: 3.2 MIL/uL — ABNORMAL LOW (ref 3.87–5.11)
RDW: 21.2 % — ABNORMAL HIGH (ref 11.5–15.5)
WBC: 4.5 10*3/uL (ref 4.0–10.5)
nRBC: 0 % (ref 0.0–0.2)

## 2022-09-04 LAB — HEPATITIS B SURFACE ANTIBODY, QUANTITATIVE: Hep B S AB Quant (Post): 58.6 m[IU]/mL (ref >9.9)

## 2022-09-04 LAB — PROCALCITONIN: Procalcitonin: 0.38 ng/mL

## 2022-09-04 MED ORDER — OXYCODONE HCL 5 MG PO TABS
5.0000 mg | ORAL_TABLET | ORAL | Status: DC | PRN
  Administered 2022-09-04: 10 mg via ORAL
  Administered 2022-09-04: 5 mg via ORAL
  Administered 2022-09-05 – 2022-09-06 (×5): 10 mg via ORAL
  Filled 2022-09-04 (×7): qty 2

## 2022-09-04 MED ORDER — HYDROMORPHONE HCL 1 MG/ML IJ SOLN
0.5000 mg | INTRAMUSCULAR | Status: DC | PRN
  Administered 2022-09-04 – 2022-09-06 (×8): 0.5 mg via INTRAVENOUS
  Filled 2022-09-04 (×8): qty 0.5

## 2022-09-04 MED ORDER — GUAIFENESIN ER 600 MG PO TB12
1200.0000 mg | ORAL_TABLET | Freq: Two times a day (BID) | ORAL | Status: DC
  Administered 2022-09-04 – 2022-09-06 (×5): 1200 mg via ORAL
  Filled 2022-09-04 (×5): qty 2

## 2022-09-04 NOTE — Progress Notes (Signed)
OT Cancellation Note  Patient Details Name: Ashley [name] MRN: [id number] DOB: January 23, 1951   Cancelled Treatment:    Reason Eval/Treat Not Completed: OT screened, no needs identified, will sign off. Per conversation with PT and observation of pt walking in the hall, pt appears to be at baseline with ADLs.   Tyrone Schimke, OT Acute Rehabilitation Services Office: [phone]   Hortencia Pilar 09/04/2022, 11:13 AM

## 2022-09-04 NOTE — Progress Notes (Signed)
PROGRESS NOTE    Ashley [name]  [id number] DOB: 02/02/1951 DOA: 09/02/2022 PCP: Sonia Side., FNP   Brief Narrative: Ashley [name] is a 72 y.o. female with a history of ESRD on hemodialysis, CVA, hypertension, substance abuse, pancreatitis.  Patient presents secondary to abdominal pain, diarrhea, cough.  Patient was noted to have a likely diarrheal illness with possible acute pancreatitis as abdomen by abdominal pain and elevated lipase.  Chest x-ray was significant for possible left-sided basilar pneumonia and patient started empirically on antibiotics.  Patient with evidence of hypoxia and was to provided with supplemental oxygen for support.   Assessment and Plan:  Community acquired pneumonia Patient with hypoxia and slightly productive cough. Procalcitonin is elevated at 0.35. Empiric Ceftriaxone and azithromycin started on admission. -Continue Ceftriaxone/Azithromycin  Hypoxia Patient without associated dyspnea per report. Chest imaging with left sided basilar atelectasis vs infiltrate with effusion. -Wean to room air -Continued PT  ESRD on HD Nephrology consulted for management of HD needs while inpatient.  Primary hypertension -Continue amlodipine and hydralazine  Abdominal pain Diarrhea CT imaging suggestive of diarrheal illness although patient states symptoms are similar to prior pancreatitis flares. Symptoms are improved today but still intermittent. GI pathogen and C. Difficile testing ordered. Lipase of 59. No CT imaging evidence of acute pancreatitis. -Continue oral diet for now  Acute pancreatitis Patient does not meet criteria for acute pancreatitis.  Lipase of 59 with no CT evidence of acute pancreatitis on admission, however pain has now worsened, concerning for development of acute pancreatitis -Clear liquid diet, advance as able -Continue analgesic therapy   DVT prophylaxis: Heparin subq Code Status:   Code Status: Full Code Family  Communication: None at bedside Disposition Plan: Discharge home likely in 1-3 days pending improved oral intake and reduced pain   Consultants:  Nephrology  Procedures:  Hemodialysis  Antimicrobials: Ceftriaxone Azithromycin    Subjective: Patient reports continued coughing; patient feels she needs to cough up phlegm but cannot get it up. Abdominal pain is worsening. No nausea/vomiting.  Objective: BP (!) 162/76 (BP Location: Right Arm)   Pulse 96   Temp 98.4 F (36.9 C) (Oral)   Resp 17   Ht 5\' 2"  (1.575 m)   Wt 49.5 kg   SpO2 100%   BMI 19.96 kg/m   Examination:  General exam: Appears calm and comfortable Respiratory system: Rhonchi. Respiratory effort normal. Cardiovascular system: S1 & S2 heard, RRR. Gastrointestinal system: Abdomen is nondistended, soft and tender in epigastric area. Central nervous system: Alert and oriented. No focal neurological deficits. Musculoskeletal: No edema. No calf tenderness Skin: No cyanosis. No rashes Psychiatry: Judgement and insight appear normal. Mood & affect appropriate.    Data Reviewed: I have personally reviewed following labs and imaging studies  CBC Lab Results  Component Value Date   WBC 4.5 09/04/2022   RBC 3.20 (L) 09/04/2022   HGB 9.9 (L) 09/04/2022   HCT 30.3 (L) 09/04/2022   MCV 94.7 09/04/2022   MCH 30.9 09/04/2022   PLT 333 09/04/2022   MCHC 32.7 09/04/2022   RDW 21.2 (H) 09/04/2022   LYMPHSABS 1.9 02/05/2021   MONOABS 0.8 02/05/2021   EOSABS 0.2 02/05/2021   BASOSABS 0.0 [id number]     Last metabolic panel Lab Results  Component Value Date   NA 138 09/04/2022   K 4.3 09/04/2022   CL 95 (L) 09/04/2022   CO2 27 09/04/2022   BUN 10 09/04/2022   CREATININE 5.76 (H) 09/04/2022   GLUCOSE  94 09/04/2022   GFRNONAA 7 (L) 09/04/2022   GFRAA 8 (L) 01/11/2020   CALCIUM 8.4 (L) 09/04/2022   PHOS 4.8 (H) 04/13/2021   PROT 7.3 09/02/2022   ALBUMIN 3.7 09/02/2022   LABGLOB 3.1 04/02/2018   AGRATIO  1.4 04/02/2018   BILITOT 0.9 09/02/2022   ALKPHOS 55 09/02/2022   AST 22 09/02/2022   ALT 14 09/02/2022   ANIONGAP 16 (H) 09/04/2022    GFR: Estimated Creatinine Clearance: 6.8 mL/min (A) (by C-G formula based on SCr of 5.76 mg/dL (H)).  No results found for this or any previous visit (from the past 240 hour(s)).    Radiology Studies: CT ABDOMEN PELVIS WO CONTRAST  Result Date: 09/03/2022 CLINICAL DATA:  Abdominal pain for 2 weeks, concern for pancreatitis. Diarrhea. EXAM: CT ABDOMEN AND PELVIS WITHOUT CONTRAST TECHNIQUE: Multidetector CT imaging of the abdomen and pelvis was performed following the standard protocol without IV contrast. RADIATION DOSE REDUCTION: This exam was performed according to the departmental dose-optimization program which includes automated exposure control, adjustment of the mA and/or kV according to patient size and/or use of iterative reconstruction technique. COMPARISON:  04/12/2021. FINDINGS: Lower chest: Coronary artery calcifications are noted. There are small bilateral pleural effusions with atelectasis or infiltrate at the lung bases. Hepatobiliary: No focal liver abnormality is seen. No gallstones, gallbladder wall thickening, or biliary dilatation. Pancreas: No focal abnormality. There is mild dilatation of the pancreatic duct measuring up to 5 mm. No surrounding fat stranding is seen. Spleen: Normal in size without focal abnormality. Adrenals/Urinary Tract: There is thickening of the adrenal glands bilaterally with no discrete nodule. Renal atrophy is noted bilaterally. There is a cyst in the mid right kidney. No renal calculus or hydronephrosis. The bladder is decompressed Stomach/Bowel: Stomach is within normal limits. Appendix appears normal. No evidence of bowel wall thickening, distention, or inflammatory changes. No free air or pneumatosis. Air-fluid levels are noted in the colon, compatible with history of diarrhea illness. Vascular/Lymphatic: Aortic  atherosclerosis. No enlarged abdominal or pelvic lymph nodes. Reproductive: Status post hysterectomy. No adnexal masses. Other: No abdominopelvic ascites. Musculoskeletal: Degenerative changes are present in the thoracolumbar spine. No acute osseous abnormality. IMPRESSION: 1. Small bilateral pleural effusions with atelectasis or infiltrate at the lung bases. 2. Air-fluid levels in the colon, compatible with a diarrheal illness. 3. Dilatation of the pancreatic duct measuring up to 5 mm, improved from the prior exam. No surrounding inflammatory changes are seen. 4. Bilateral renal atrophy. 5. Aortic atherosclerosis. Electronically Signed   By: Brett Fairy M.D.   On: 09/03/2022 01:41   DG Chest Portable 1 View  Result Date: 09/02/2022 CLINICAL DATA:  Shortness of breath EXAM: PORTABLE CHEST 1 VIEW COMPARISON:  02/07/2021 FINDINGS: Cardiac shadow is mildly prominent. Aortic calcifications are seen. The lungs are well aerated bilaterally. Left basilar atelectasis/infiltrate with small effusion is seen. Vascular stenting is noted in the left upper arm. No bony abnormality is seen. IMPRESSION: Left basilar atelectasis/early infiltrate with small effusion. Electronically Signed   By: Inez Catalina M.D.   On: 09/02/2022 23:29      LOS: 1 day    Cordelia Poche, MD [facility] 09/04/2022, 3:22 PM   If 7PM-7AM, please contact night-coverage [email or web address]

## 2022-09-04 NOTE — Evaluation (Signed)
Physical Therapy Evaluation Patient Details Name: Ashley [name] MRN: [id number] DOB: November 10, 1950 Today's Date: 09/04/2022  History of Present Illness  Ashley [name] is a/an 72 y.o. female admitted 3/26 with SOB and hypoxia.  Positive for PNA.  Also had diarrhea on admission. PMH:  ESRD on dialysis, HTN, prior CVA  Clinical Impression  Pt admitted with above diagnosis. Pt was able to ambulate on unit however was unsteady at times needing support by PT.  REcommended to pt to obtain rollator for home and pt agrees. Pt also would benefit from HHPT f/u.  Pt has flight of stairs to enter house. Will practice when pt is ready to practice stairs.  Pt currently with functional limitations due to the deficits listed below (see PT Problem List). Pt will benefit from acute skilled PT to increase their independence and safety with mobility to allow discharge.          Recommendations for follow up therapy are one component of a multi-disciplinary discharge planning process, led by the attending physician.  Recommendations may be updated based on patient status, additional functional criteria and insurance authorization.  Follow Up Recommendations       Assistance Recommended at Discharge Intermittent Supervision/Assistance  Patient can return home with the following  A little help with walking and/or transfers;Assistance with cooking/housework;Help with stairs or ramp for entrance;Assist for transportation    Equipment Recommendations Rollator (4 wheels)  Recommendations for Other Services       Functional Status Assessment Patient has had a recent decline in their functional status and demonstrates the ability to make significant improvements in function in a reasonable and predictable amount of time.     Precautions / Restrictions Precautions Precautions: Fall Restrictions Weight Bearing Restrictions: No      Mobility  Bed Mobility Overal bed mobility: Independent                   Transfers Overall transfer level: Needs assistance Equipment used: None Transfers: Sit to/from Stand Sit to Stand: Min assist           General transfer comment: Upon standing, pt with LOB backwards and sat back down on bed needing min assist for safety. Pt states this is unusual for her to be off balance.    Ambulation/Gait Ambulation/Gait assistance: Min assist Gait Distance (Feet): 250 Feet Assistive device: None Gait Pattern/deviations: Step-through pattern, Decreased stride length, Drifts right/left, Leaning posteriorly   Gait velocity interpretation: <1.31 ft/sec, indicative of household ambulator   General Gait Details: Pt was able to ambulate without device with slight unsteady gait needing steadying assist at times. Pt states she is not at baseline as she didnt have LOB prior to admit.  Stairs            Wheelchair Mobility    Modified Rankin (Stroke Patients Only)       Balance Overall balance assessment: Needs assistance Sitting-balance support: No upper extremity supported, Feet supported Sitting balance-Leahy Scale: Fair     Standing balance support: No upper extremity supported, During functional activity Standing balance-Leahy Scale: Poor Standing balance comment: Pt needed steadying with static balance and dynamic balance                             Pertinent Vitals/Pain Pain Assessment Pain Assessment: Faces Faces Pain Scale: Hurts even more Pain Location: abdomen Pain Descriptors / Indicators: Aching Pain Intervention(s): Limited activity within patient's tolerance, Monitored during session,  Repositioned, Premedicated before session    Home Living Family/patient expects to be discharged to:: Private residence Living Arrangements: Children Available Help at Discharge: Family;Available 24 hours/day (daughter works from ho,me) Type of Home: Apartment (second floor) Home Access: Stairs to enter Entrance Stairs-Rails:  Right Entrance Stairs-Number of Steps: flight   Home Layout: One level Home Equipment: Shower seat      Prior Function Prior Level of Function : Independent/Modified Independent               ADLs Comments: Pt states she did own ADLs     Hand Dominance   Dominant Hand: Right    Extremity/Trunk Assessment   Upper Extremity Assessment Upper Extremity Assessment: Defer to OT evaluation    Lower Extremity Assessment Lower Extremity Assessment: Overall WFL for tasks assessed    Cervical / Trunk Assessment Cervical / Trunk Assessment: Normal  Communication   Communication: No difficulties  Cognition Arousal/Alertness: Awake/alert Behavior During Therapy: WFL for tasks assessed/performed Overall Cognitive Status: Within Functional Limits for tasks assessed                                          General Comments General comments (skin integrity, edema, etc.): VSS with HR 100 bpm with activity. O2 sat >90%    Exercises General Exercises - Lower Extremity Ankle Circles/Pumps: AROM, Both, 5 reps, Supine Long Arc Quad: AROM, Both, 5 reps, Seated Hip Flexion/Marching: AROM, Both, 10 reps, Seated   Assessment/Plan    PT Assessment Patient needs continued PT services  PT Problem List Decreased activity tolerance;Decreased balance;Decreased mobility;Decreased knowledge of use of DME;Decreased safety awareness;Decreased knowledge of precautions;Cardiopulmonary status limiting activity       PT Treatment Interventions DME instruction;Gait training;Functional mobility training;Therapeutic activities;Therapeutic exercise;Balance training;Patient/family education;Stair training    PT Goals (Current goals can be found in the Care Plan section)  Acute Rehab PT Goals Patient Stated Goal: to go home PT Goal Formulation: With patient Time For Goal Achievement: 09/18/22 Potential to Achieve Goals: Good    Frequency Min 3X/week     Co-evaluation                AM-PAC PT "6 Clicks" Mobility  Outcome Measure Help needed turning from your back to your side while in a flat bed without using bedrails?: None Help needed moving from lying on your back to sitting on the side of a flat bed without using bedrails?: None Help needed moving to and from a bed to a chair (including a wheelchair)?: A Little Help needed standing up from a chair using your arms (e.g., wheelchair or bedside chair)?: A Little Help needed to walk in hospital room?: A Little Help needed climbing 3-5 steps with a railing? : A Lot 6 Click Score: 19    End of Session Equipment Utilized During Treatment: Gait belt Activity Tolerance: Patient tolerated treatment well Patient left: in chair;with call bell/phone within reach;with chair alarm set Nurse Communication: Mobility status PT Visit Diagnosis: Unsteadiness on feet (R26.81);Muscle weakness (generalized) (M62.81)    Time: [id number] PT Time Calculation (min) (ACUTE ONLY): 16 min   Charges:   PT Evaluation $PT Eval Moderate Complexity: 1 Mod          Lux Skilton M,PT Acute Rehab Services [place] 09/04/2022, 1:36 PM

## 2022-09-04 NOTE — Progress Notes (Signed)
Nephrology Follow-Up Consult note   Assessment/Recommendations: Ashley [name] is a/an 72 y.o. female with a past medical history significant for ESRD on dialysis, HTN, prior CVA , admitted for sob.      Dialysis Orders: Center: [facility]  on MWF . 160NRe 3 hr 30 min BFR 350 DFR 500 EDW 51kg (just lowered) 2K 2Ca AVF 15g No heparin Mircera 11mcg IV q 2 weeks- last dose 08/29/22 Venofer 100mg  IV q HD- stop date 09/12/22 Hectorol 78mcg IV q HD   Assessment/Plan:  Acute hypoxic respiratory failure/pneumonia: Abx per primary. May be mostly volume. Has improved. Abdominal pain/diarrhea: per primary team, chronic pancreas issues per patient. May benefit from outpatient GI eval  ESRD:  Dialysis on MWF schedule while here.  Hypertension/volume: has been volume overloaded likely related to weight loss and need for EDW adjustment. UF with HD  Anemia: Hgb 9.8. Not due for ESA. CTM  Metabolic bone disease: Calcium is slightly low. Will continue hectorol with HD and follow. Checking phos with next labs.   Nutrition:  Alb 3.7, renal diet as tolerated   Recommendations conveyed to primary service.    [facility] 09/04/2022 9:07 AM  ___________________________________________________________  CC: Shortness of breath  Interval History/Subjective: Minimal abdominal pain today.  No shortness of breath.  No diarrhea.  No issues with dialysis yesterday   Medications:  Current Facility-Administered Medications  Medication Dose Route Frequency Provider Last Rate Last Admin   acetaminophen (TYLENOL) tablet 650 mg  650 mg Oral Q6H PRN Opyd, Ilene Qua, MD   650 mg at 09/03/22 2335   Or   acetaminophen (TYLENOL) suppository 650 mg  650 mg Rectal Q6H PRN Opyd, Ilene Qua, MD       amLODipine (NORVASC) tablet 10 mg  10 mg Oral Q1200 Opyd, Ilene Qua, MD   10 mg at 09/03/22 1109   atorvastatin (LIPITOR) tablet 20 mg  20 mg Oral QHS Opyd, Ilene Qua, MD   20 mg at 09/03/22 2137    azithromycin (ZITHROMAX) 500 mg in sodium chloride 0.9 % 250 mL IVPB  500 mg Intravenous Q24H Opyd, Ilene Qua, MD       cefTRIAXone (ROCEPHIN) 2 g in sodium chloride 0.9 % 100 mL IVPB  2 g Intravenous QHS Opyd, Ilene Qua, MD 200 mL/hr at 09/03/22 2147 2 g at 09/03/22 2147   Chlorhexidine Gluconate Cloth 2 % PADS 6 each  6 each Topical Q0600 Janalee Dane, PA-C   6 each at 09/03/22 1244   cinacalcet (SENSIPAR) tablet 60 mg  60 mg Oral QPM Opyd, Ilene Qua, MD   60 mg at 09/03/22 1840   heparin injection 5,000 Units  5,000 Units Subcutaneous Q8H Opyd, Ilene Qua, MD   5,000 Units at 09/04/22 0500   hydrALAZINE (APRESOLINE) tablet 75 mg  75 mg Oral Q8H Opyd, Ilene Qua, MD   75 mg at 09/04/22 0500   HYDROmorphone (DILAUDID) injection 0.5 mg  0.5 mg Intravenous Q4H PRN Opyd, Ilene Qua, MD   0.5 mg at 09/04/22 0440   oxyCODONE (Oxy IR/ROXICODONE) immediate release tablet 5 mg  5 mg Oral Q4H PRN Opyd, Ilene Qua, MD   5 mg at 09/04/22 0805   pantoprazole (PROTONIX) EC tablet 40 mg  40 mg Oral Daily Opyd, Ilene Qua, MD   40 mg at 09/03/22 1109   sevelamer carbonate (RENVELA) tablet 800 mg  800 mg Oral TID WC Opyd, Ilene Qua, MD   800 mg at 09/04/22 [phone]  Review of Systems: 10 systems reviewed and negative except per interval history/subjective  Physical Exam: Vitals:   09/04/22 0514 09/04/22 0824  BP: (!) 155/72 (!) 153/70  Pulse: 94 94  Resp: 17   Temp: 98.4 F (36.9 C) 98.4 F (36.9 C)  SpO2: 91% 97%   No intake/output data recorded.  Intake/Output Summary (Last 24 hours) at 09/04/2022 [id number] Last data filed at 09/03/2022 1800 Gross per 24 hour  Intake 355 ml  Output 2400 ml  Net -2045 ml   Constitutional: well-appearing, no acute distress ENMT: ears and nose without scars or lesions, MMM CV: normal rate, no edema Respiratory: Bilateral chest rise, normal work of breathing Gastrointestinal: soft, minimally tender to palpation, no palpable masses or hernias Skin: no visible  lesions or rashes Psych: alert, judgement/insight appropriate, appropriate mood and affect   Test Results I personally reviewed new and old clinical labs and radiology tests Lab Results  Component Value Date   NA 139 09/03/2022   K 4.4 09/03/2022   CL 98 09/03/2022   CO2 25 09/03/2022   BUN 15 09/03/2022   CREATININE 7.72 (H) 09/03/2022   CALCIUM 7.8 (L) 09/03/2022   ALBUMIN 3.7 09/02/2022   PHOS 4.8 (H) 04/13/2021    CBC Recent Labs  Lab 09/02/22 2212 09/03/22 0437  WBC 4.0 3.5*  HGB 10.0* 9.8*  HCT 30.2* 30.2*  MCV 92.6 92.4  PLT 339 146*

## 2022-09-04 NOTE — Progress Notes (Addendum)
Pt receives out-pt HD at [place] on MWF. Will assist as needed.   Melven Sartorius Renal Navigator [phone]  Addendum at 3:42 pm: Case discussed with attending. Pt is not stable for d/c today.

## 2022-09-05 DIAGNOSIS — J9601 Acute respiratory failure with hypoxia: Secondary | ICD-10-CM | POA: Diagnosis not present

## 2022-09-05 DIAGNOSIS — I10 Essential (primary) hypertension: Secondary | ICD-10-CM | POA: Diagnosis not present

## 2022-09-05 DIAGNOSIS — N186 End stage renal disease: Secondary | ICD-10-CM | POA: Diagnosis not present

## 2022-09-05 DIAGNOSIS — Z8673 Personal history of transient ischemic attack (TIA), and cerebral infarction without residual deficits: Secondary | ICD-10-CM | POA: Diagnosis not present

## 2022-09-05 LAB — BASIC METABOLIC PANEL
Anion gap: 14 (ref 5–15)
BUN: 13 mg/dL (ref 8–23)
CO2: 27 mmol/L (ref 22–32)
Calcium: 8.2 mg/dL — ABNORMAL LOW (ref 8.9–10.3)
Chloride: 96 mmol/L — ABNORMAL LOW (ref 98–111)
Creatinine, Ser: 7.52 mg/dL — ABNORMAL HIGH (ref 0.44–1.00)
GFR, Estimated: 5 mL/min — ABNORMAL LOW (ref >60)
Glucose, Bld: 100 mg/dL — ABNORMAL HIGH (ref 70–99)
Potassium: 3.7 mmol/L (ref 3.5–5.1)
Sodium: 137 mmol/L (ref 135–145)

## 2022-09-05 LAB — CBC
HCT: 27.9 % — ABNORMAL LOW (ref 36.0–46.0)
Hemoglobin: 9.2 g/dL — ABNORMAL LOW (ref 12.0–15.0)
MCH: 30.5 pg (ref 26.0–34.0)
MCHC: 33 g/dL (ref 30.0–36.0)
MCV: 92.4 fL (ref 80.0–100.0)
Platelets: 305 10*3/uL (ref 150–400)
RBC: 3.02 MIL/uL — ABNORMAL LOW (ref 3.87–5.11)
RDW: 21.2 % — ABNORMAL HIGH (ref 11.5–15.5)
WBC: 3.7 10*3/uL — ABNORMAL LOW (ref 4.0–10.5)
nRBC: 0 % (ref 0.0–0.2)

## 2022-09-05 LAB — PHOSPHORUS: Phosphorus: 2.8 mg/dL (ref 2.5–4.6)

## 2022-09-05 NOTE — Progress Notes (Signed)
Nephrology Follow-Up Consult note   Assessment/Recommendations: Ashley [name] is a/an 72 y.o. female with a past medical history significant for ESRD on dialysis, HTN, prior CVA , admitted for sob.      Dialysis Orders: Center: [facility]  on MWF . 160NRe 3 hr 30 min BFR 350 DFR 500 EDW 51kg (just lowered) 2K 2Ca AVF 15g No heparin Mircera 141mcg IV q 2 weeks- last dose 08/29/22 Venofer 100mg  IV q HD- stop date 09/12/22 Hectorol 71mcg IV q HD   Assessment/Plan:  Acute hypoxic respiratory failure/pneumonia: Abx per primary. Overall improved. UF on HD as tolerated Abdominal pain/diarrhea: per primary team, chronic pancreas issues per patient. May benefit from outpatient GI eval  ESRD:  Dialysis on MWF schedule while here.  Hypertension/volume: has been volume overloaded likely related to weight loss and need for EDW adjustment. UF with HD  Anemia: Hgb 9.8. Not due for ESA. CTM  Metabolic bone disease: Calcium is slightly low. Will continue hectorol with HD and follow. Phos added on   Recommendations conveyed to primary service.    [facility] 09/05/2022 9:40 AM  ___________________________________________________________  CC: Shortness of breath  Interval History/Subjective: Ongoing abd pain. No other complaints   Medications:  Current Facility-Administered Medications  Medication Dose Route Frequency Provider Last Rate Last Admin   acetaminophen (TYLENOL) tablet 650 mg  650 mg Oral Q6H PRN Opyd, Ilene Qua, MD   650 mg at 09/03/22 2335   Or   acetaminophen (TYLENOL) suppository 650 mg  650 mg Rectal Q6H PRN Opyd, Ilene Qua, MD       amLODipine (NORVASC) tablet 10 mg  10 mg Oral Q1200 Opyd, Ilene Qua, MD   10 mg at 09/04/22 1210   atorvastatin (LIPITOR) tablet 20 mg  20 mg Oral QHS Opyd, Ilene Qua, MD   20 mg at 09/04/22 2056   azithromycin (ZITHROMAX) 500 mg in sodium chloride 0.9 % 250 mL IVPB  500 mg Intravenous Q24H Opyd, Ilene Qua, MD   Stopped at  09/04/22 2353   cefTRIAXone (ROCEPHIN) 2 g in sodium chloride 0.9 % 100 mL IVPB  2 g Intravenous QHS Vianne Bulls, MD   Stopped at 09/04/22 2124   Chlorhexidine Gluconate Cloth 2 % PADS 6 each  6 each Topical Q0600 Janalee Dane, PA-C   6 each at 09/03/22 1244   cinacalcet (SENSIPAR) tablet 60 mg  60 mg Oral QPM Opyd, Ilene Qua, MD   60 mg at 09/04/22 1729   guaiFENesin (MUCINEX) 12 hr tablet 1,200 mg  1,200 mg Oral BID Mariel Aloe, MD   1,200 mg at 09/04/22 2055   heparin injection 5,000 Units  5,000 Units Subcutaneous Q8H Opyd, Ilene Qua, MD   5,000 Units at 09/05/22 0504   hydrALAZINE (APRESOLINE) tablet 75 mg  75 mg Oral Q8H Opyd, Ilene Qua, MD   75 mg at 09/05/22 0503   HYDROmorphone (DILAUDID) injection 0.5 mg  0.5 mg Intravenous Q4H PRN Mariel Aloe, MD   0.5 mg at 09/05/22 0453   oxyCODONE (Oxy IR/ROXICODONE) immediate release tablet 5-10 mg  5-10 mg Oral Q4H PRN Mariel Aloe, MD   10 mg at 09/05/22 0754   pantoprazole (PROTONIX) EC tablet 40 mg  40 mg Oral Daily Opyd, Ilene Qua, MD   40 mg at 09/04/22 0935   sevelamer carbonate (RENVELA) tablet 800 mg  800 mg Oral TID WC Opyd, Ilene Qua, MD   800 mg at 09/04/22 1729  Review of Systems: 10 systems reviewed and negative except per interval history/subjective  Physical Exam: Vitals:   09/05/22 0900 09/05/22 0930  BP: (!) 164/75 (!) 159/79  Pulse: 97 (!) 103  Resp: 12 13  Temp:    SpO2: 97%    No intake/output data recorded.  Intake/Output Summary (Last 24 hours) at 09/05/2022 0940 Last data filed at 09/05/2022 0342 Gross per 24 hour  Intake 623.43 ml  Output --  Net 623.43 ml   Constitutional: well-appearing, no acute distress ENMT: ears and nose without scars or lesions, MMM CV: normal rate, no edema Respiratory: Bilateral chest rise, normal work of breathing Gastrointestinal: soft, tender to palpation, no palpable masses or hernias Skin: no visible lesions or rashes Psych: alert, judgement/insight  appropriate, appropriate mood and affect   Test Results I personally reviewed new and old clinical labs and radiology tests Lab Results  Component Value Date   NA 137 09/05/2022   K 3.7 09/05/2022   CL 96 (L) 09/05/2022   CO2 27 09/05/2022   BUN 13 09/05/2022   CREATININE 7.52 (H) 09/05/2022   CALCIUM 8.2 (L) 09/05/2022   ALBUMIN 3.7 09/02/2022   PHOS 4.8 (H) 04/13/2021    CBC Recent Labs  Lab 09/03/22 0437 09/04/22 0855 09/05/22 0452  WBC 3.5* 4.5 3.7*  HGB 9.8* 9.9* 9.2*  HCT 30.2* 30.3* 27.9*  MCV 92.4 94.7 92.4  PLT 146* 333 305

## 2022-09-05 NOTE — Progress Notes (Signed)
Flutter valve introduced with teach back.

## 2022-09-05 NOTE — Progress Notes (Signed)
PT Cancellation Note  Patient Details Name: Ashley [name] MRN: [id number] DOB: May 05, 1951   Cancelled Treatment:    Reason Eval/Treat Not Completed: Fatigue/lethargy limiting ability to participate;Patient at procedure or test/unavailable.  Pt was in HD earlier, then twice declined PT directly over fatigue.  Follow up as time and pt allow.   [name] 09/05/2022, 3:48 PM  [name], PT PhD Acute Rehab Dept. Number: [place] [facility] [place]

## 2022-09-05 NOTE — Progress Notes (Signed)
PROGRESS NOTE    Ashley [name]  [id number] DOB: 1950-12-29 DOA: 09/02/2022 PCP: Sonia Side., FNP   Brief Narrative: Ashley [name] is a 72 y.o. female with a history of ESRD on hemodialysis, CVA, hypertension, substance abuse, pancreatitis.  Patient presents secondary to abdominal pain, diarrhea, cough.  Patient was noted to have a likely diarrheal illness with possible acute pancreatitis as abdomen by abdominal pain and elevated lipase.  Chest x-ray was significant for possible left-sided basilar pneumonia and patient started empirically on antibiotics.  Patient with evidence of hypoxia and was to provided with supplemental oxygen for support.   Assessment and Plan:  Community acquired pneumonia Patient with hypoxia and slightly productive cough. Procalcitonin is elevated at 0.35. Empiric Ceftriaxone and azithromycin started on admission. -Continue Ceftriaxone/Azithromycin  Hypoxia Patient without associated dyspnea per report. Chest imaging with left sided basilar atelectasis vs infiltrate with effusion. Patient weaned to room air. Hypoxia resolved. -Continued PT  ESRD on HD Nephrology consulted for management of HD needs while inpatient.  Primary hypertension -Continue amlodipine and hydralazine  Diarrhea CT imaging suggestive of diarrheal illness although patient states symptoms are similar to prior pancreatitis flares. GI pathogen and C. Difficile testing ordered but never collected/sent secondary to resolution of symptoms. Lipase of 59. No initial CT imaging evidence of acute pancreatitis.  Acute pancreatitis Patient does not meet criteria for acute pancreatitis.  Lipase of 59 with no CT evidence of acute pancreatitis on admission, however pain has worsened, concerning for development of acute pancreatitis -Clear liquid diet, advance as able this afternoon/evening if tolerates diet today -Continue analgesic therapy   DVT prophylaxis: Heparin subq Code Status:    Code Status: Full Code Family Communication: None at bedside Disposition Plan: Discharge home likely in 1-2 days pending improved oral intake and reduced pain   Consultants:  Nephrology  Procedures:  Hemodialysis  Antimicrobials: Ceftriaxone Azithromycin    Subjective: Patient reports an episode of emesis overnight. Patient still has abdominal pain but it is slightly improved from yesterday.  Objective: BP (!) 163/77 (BP Location: Right Arm) Comment: Simultaneous filing. User may not have seen previous data.  Pulse 96   Temp 98.4 F (36.9 C)   Resp 19   Ht 5\' 2"  (1.575 m)   Wt 50.6 kg   SpO2 98%   BMI 20.40 kg/m   Examination:  General exam: Appears calm and comfortable Respiratory system: Clear to auscultation. Respiratory effort normal. Cardiovascular system: S1 & S2 heard, slight tachycardia. Gastrointestinal system: Abdomen is nondistended, soft and mildly tender. No organomegaly or masses felt. Normal bowel sounds heard. Central nervous system: Alert and oriented. No focal neurological deficits. Musculoskeletal: No edema. No calf tenderness Skin: No cyanosis. No rashes Psychiatry: Judgement and insight appear normal. Mood & affect appropriate.    Data Reviewed: I have personally reviewed following labs and imaging studies  CBC Lab Results  Component Value Date   WBC 3.7 (L) 09/05/2022   RBC 3.02 (L) 09/05/2022   HGB 9.2 (L) 09/05/2022   HCT 27.9 (L) 09/05/2022   MCV 92.4 09/05/2022   MCH 30.5 09/05/2022   PLT 305 09/05/2022   MCHC 33.0 09/05/2022   RDW 21.2 (H) 09/05/2022   LYMPHSABS 1.9 02/05/2021   MONOABS 0.8 02/05/2021   EOSABS 0.2 02/05/2021   BASOSABS 0.0 [id number]     Last metabolic panel Lab Results  Component Value Date   NA 137 09/05/2022   K 3.7 09/05/2022   CL 96 (L) 09/05/2022  CO2 27 09/05/2022   BUN 13 09/05/2022   CREATININE 7.52 (H) 09/05/2022   GLUCOSE 100 (H) 09/05/2022   GFRNONAA 5 (L) 09/05/2022   GFRAA 8 (L)  01/11/2020   CALCIUM 8.2 (L) 09/05/2022   PHOS 4.8 (H) 04/13/2021   PROT 7.3 09/02/2022   ALBUMIN 3.7 09/02/2022   LABGLOB 3.1 04/02/2018   AGRATIO 1.4 04/02/2018   BILITOT 0.9 09/02/2022   ALKPHOS 55 09/02/2022   AST 22 09/02/2022   ALT 14 09/02/2022   ANIONGAP 14 09/05/2022    GFR: Estimated Creatinine Clearance: 5.3 mL/min (A) (by C-G formula based on SCr of 7.52 mg/dL (H)).  No results found for this or any previous visit (from the past 240 hour(s)).    Radiology Studies: No results found.    LOS: 2 days    Cordelia Poche, MD [facility] 09/05/2022, 8:40 AM   If 7PM-7AM, please contact night-coverage [email or web address]

## 2022-09-05 NOTE — Progress Notes (Signed)
Patient is back on the floor at 1305PM via bed.

## 2022-09-05 NOTE — Procedures (Signed)
I was present at this dialysis session. I have reviewed the session itself and made appropriate changes.   Filed Weights   09/03/22 1400 09/03/22 1805 09/05/22 0815  Weight: 51.9 kg 49.5 kg 50.6 kg    Recent Labs  Lab 09/05/22 0452  NA 137  K 3.7  CL 96*  CO2 27  GLUCOSE 100*  BUN 13  CREATININE 7.52*  CALCIUM 8.2*    Recent Labs  Lab 09/03/22 0437 09/04/22 0855 09/05/22 0452  WBC 3.5* 4.5 3.7*  HGB 9.8* 9.9* 9.2*  HCT 30.2* 30.3* 27.9*  MCV 92.4 94.7 92.4  PLT 146* 333 305    Scheduled Meds:  amLODipine  10 mg Oral Q1200   atorvastatin  20 mg Oral QHS   Chlorhexidine Gluconate Cloth  6 each Topical Q0600   cinacalcet  60 mg Oral QPM   guaiFENesin  1,200 mg Oral BID   heparin  5,000 Units Subcutaneous Q8H   hydrALAZINE  75 mg Oral Q8H   pantoprazole  40 mg Oral Daily   sevelamer carbonate  800 mg Oral TID WC   Continuous Infusions:  azithromycin Stopped (09/04/22 2353)   cefTRIAXone (ROCEPHIN)  IV Stopped (09/04/22 2124)   PRN Meds:.acetaminophen **OR** acetaminophen, HYDROmorphone (DILAUDID) injection, oxyCODONE   Santiago Bumpers,  MD 09/05/2022, 9:42 AM

## 2022-09-05 NOTE — Progress Notes (Signed)
Patient was taken for dialysis via bed at 0755 AM

## 2022-09-06 DIAGNOSIS — J189 Pneumonia, unspecified organism: Secondary | ICD-10-CM | POA: Insufficient documentation

## 2022-09-06 DIAGNOSIS — J9601 Acute respiratory failure with hypoxia: Secondary | ICD-10-CM | POA: Diagnosis not present

## 2022-09-06 LAB — IRON AND TIBC
Iron: 31 ug/dL (ref 28–170)
Saturation Ratios: 20 % (ref 10.4–31.8)
TIBC: 158 ug/dL — ABNORMAL LOW (ref 250–450)
UIBC: 127 ug/dL

## 2022-09-06 LAB — FERRITIN: Ferritin: 1296 ng/mL — ABNORMAL HIGH (ref 11–307)

## 2022-09-06 MED ORDER — CEFDINIR 300 MG PO CAPS: 300.0000 mg | ORAL_CAPSULE | Freq: Every day | ORAL | 0 refills | Status: AC

## 2022-09-06 MED ORDER — OXYCODONE HCL 5 MG PO TABS: 5.0000 mg | ORAL_TABLET | Freq: Four times a day (QID) | ORAL | 0 refills | Status: AC | PRN

## 2022-09-06 NOTE — Progress Notes (Signed)
Nephrology Follow-Up Consult note   Assessment/Recommendations: Ashley [name] is a/an 72 y.o. female with a past medical history significant for ESRD on dialysis, HTN, prior CVA , admitted for sob.      Dialysis Orders: Center: [facility]  on MWF . 160NRe 3 hr 30 min BFR 350 DFR 500 EDW 51kg (just lowered) 2K 2Ca AVF 15g No heparin Mircera 148mcg IV q 2 weeks- last dose 08/29/22 Venofer 100mg  IV q HD- stop date 09/12/22 Hectorol 62mcg IV q HD   Assessment/Plan:  Acute hypoxic respiratory failure/pneumonia: Abx per primary. Overall improved. Some cramps on recent HD. Will try to maintain current weight. Abdominal pain/diarrhea: per primary team, chronic pancreas issues per patient. May benefit from outpatient GI eval  ESRD:  Dialysis on MWF schedule while here.  Hypertension/volume: has been volume overloaded likely related to weight loss and need for EDW adjustment. Post weight 47.4 yesterday. Likely new EDW 48 given cramps.  Anemia: Hgb 9.8. Not due for ESA. Iron labs. CTM  Metabolic bone disease: Calcium is slightly low. Will continue hectorol with HD and follow. Phos normal.   Recommendations conveyed to primary service.    [facility] 09/06/2022 8:12 AM  ___________________________________________________________  CC: Shortness of breath  Interval History/Subjective: Some chills overnight but abd pain slightly improved. Mild nausea. Some cramps on HD yesterday. No other complaints.   Medications:  Current Facility-Administered Medications  Medication Dose Route Frequency Provider Last Rate Last Admin   acetaminophen (TYLENOL) tablet 650 mg  650 mg Oral Q6H PRN Opyd, Ilene Qua, MD   650 mg at 09/03/22 2335   Or   acetaminophen (TYLENOL) suppository 650 mg  650 mg Rectal Q6H PRN Opyd, Ilene Qua, MD       amLODipine (NORVASC) tablet 10 mg  10 mg Oral Q1200 Opyd, Ilene Qua, MD   10 mg at 09/05/22 1314   atorvastatin (LIPITOR) tablet 20 mg  20 mg Oral  QHS Opyd, Ilene Qua, MD   20 mg at 09/05/22 2323   azithromycin (ZITHROMAX) 500 mg in sodium chloride 0.9 % 250 mL IVPB  500 mg Intravenous Q24H Opyd, Ilene Qua, MD   Stopped at 09/06/22 0131   Chlorhexidine Gluconate Cloth 2 % PADS 6 each  6 each Topical Q0600 Janalee Dane, PA-C   6 each at 09/06/22 0600   cinacalcet (SENSIPAR) tablet 60 mg  60 mg Oral QPM Opyd, Ilene Qua, MD   60 mg at 09/05/22 1755   guaiFENesin (MUCINEX) 12 hr tablet 1,200 mg  1,200 mg Oral BID Mariel Aloe, MD   1,200 mg at 09/05/22 2324   heparin injection 5,000 Units  5,000 Units Subcutaneous Q8H Opyd, Ilene Qua, MD   5,000 Units at 09/06/22 0559   hydrALAZINE (APRESOLINE) tablet 75 mg  75 mg Oral Q8H Opyd, Ilene Qua, MD   75 mg at 09/06/22 0559   HYDROmorphone (DILAUDID) injection 0.5 mg  0.5 mg Intravenous Q4H PRN Mariel Aloe, MD   0.5 mg at 09/06/22 0800   oxyCODONE (Oxy IR/ROXICODONE) immediate release tablet 5-10 mg  5-10 mg Oral Q4H PRN Mariel Aloe, MD   10 mg at 09/06/22 0402   pantoprazole (PROTONIX) EC tablet 40 mg  40 mg Oral Daily Opyd, Ilene Qua, MD   40 mg at 09/05/22 1313   sevelamer carbonate (RENVELA) tablet 800 mg  800 mg Oral TID WC Opyd, Ilene Qua, MD   800 mg at 09/06/22 [phone]  Review of Systems: 10 systems reviewed and negative except per interval history/subjective  Physical Exam: Vitals:   09/06/22 0530 09/06/22 0811  BP: (!) 147/67 (!) 153/75  Pulse: (!) 102 (!) 102  Resp: 16 18  Temp: 98 F (36.7 C) 98.4 F (36.9 C)  SpO2:  100%   No intake/output data recorded.  Intake/Output Summary (Last 24 hours) at 09/06/2022 [id number] Last data filed at 09/06/2022 0445 Gross per 24 hour  Intake 799.17 ml  Output 3200 ml  Net -2400.83 ml   Constitutional: well-appearing, no acute distress ENMT: ears and nose without scars or lesions, MMM CV: normal rate, no edema Respiratory: Bilateral chest rise, normal work of breathing Gastrointestinal: soft, tender to palpation, no  palpable masses or hernias Skin: no visible lesions or rashes Psych: alert, judgement/insight appropriate, appropriate mood and affect   Test Results I personally reviewed new and old clinical labs and radiology tests Lab Results  Component Value Date   NA 137 09/05/2022   K 3.7 09/05/2022   CL 96 (L) 09/05/2022   CO2 27 09/05/2022   BUN 13 09/05/2022   CREATININE 7.52 (H) 09/05/2022   CALCIUM 8.2 (L) 09/05/2022   ALBUMIN 3.7 09/02/2022   PHOS 2.8 09/05/2022    CBC Recent Labs  Lab 09/03/22 0437 09/04/22 0855 09/05/22 0452  WBC 3.5* 4.5 3.7*  HGB 9.8* 9.9* 9.2*  HCT 30.2* 30.3* 27.9*  MCV 92.4 94.7 92.4  PLT 146* 333 305

## 2022-09-06 NOTE — Progress Notes (Signed)
Mobility Specialist: Progress Note   09/06/22 1300  Mobility  Activity Ambulated with assistance in hallway  Level of Assistance Contact guard assist, steadying assist  Assistive Device None  Distance Ambulated (ft) 150 ft  Activity Response Tolerated well  Mobility Referral Yes  $Mobility charge 1 Mobility   Pt received in the bed and agreeable to mobility. Mod I with bed mobility and contact guard during ambulation for balance as pt mildly unsteady. LOB x1 when walking around the end of the bed but pt able to self recover. No c/o throughout. Pt back to bed after session with call bell and phone in reach.   Dale Analise Glotfelty Mobility Specialist Please contact via SecureChat or Rehab office at [phone]

## 2022-09-06 NOTE — Discharge Summary (Signed)
Physician Discharge Summary   Patient: Ashley [name] MRN: [id number] DOB: 30-Jul-1950  Admit date:     09/02/2022  Discharge date: 09/06/22  Discharge Physician: Cordelia Poche, MD   PCP: Sonia Side., FNP   Recommendations at discharge:  PCP follow-up  Discharge Diagnoses: Principal Problem:   Acute respiratory failure with hypoxia [facility]) Active Problems:   Hypertension   Abdominal pain   ESRD on hemodialysis ([place])   History of CVA (cerebrovascular accident)   Diarrhea   CAP (community acquired pneumonia)  Resolved Problems:   * No resolved hospital problems. *  Hospital Course: Ashley [name] is a 72 y.o. female with a history of ESRD on hemodialysis, CVA, hypertension, substance abuse, pancreatitis.  Patient presents secondary to abdominal pain, diarrhea, cough.  Patient was noted to have a likely diarrheal illness with possible acute pancreatitis as abdomen by abdominal pain and elevated lipase.  Chest x-ray was significant for possible left-sided basilar pneumonia and patient started empirically on antibiotics.  Patient with evidence of hypoxia and was to provided with supplemental oxygen for support. Hospitalization complicated by acute pancreatitis which improved with bowel rest and slowly advancing diet. Discharge to complete antibiotics.  Assessment and Plan:  Community acquired pneumonia Patient with hypoxia and slightly productive cough. Procalcitonin is elevated at 0.35. Empiric Ceftriaxone and azithromycin started on admission. Discharge on Cefdinir to complete a 5 day course of antibiotics.   Acute respiratory failure with hypoxia Per H&P, patient presented with associated shortness of breath and cough. Chest imaging with left sided basilar atelectasis vs infiltrate with effusion. Patient managed with antibiotics and hemodialysis. Patient weaned to room air. Hypoxia resolved.   ESRD on HD Nephrology consulted for management of HD needs while inpatient.   Primary  hypertension Continue amlodipine and hydralazine   Diarrhea CT imaging suggestive of diarrheal illness although patient states symptoms are similar to prior pancreatitis flares. GI pathogen and C. Difficile testing ordered but never collected/sent secondary to resolution of symptoms. Lipase of 59. No initial CT imaging evidence of acute pancreatitis.   Acute pancreatitis Patient does not meet criteria for acute pancreatitis.  Lipase of 59 with no CT evidence of acute pancreatitis on admission, however pain has worsened, concerning for development of acute pancreatitis which improved with bowel rest and slowly advancing diet. Patient tolerating a soft diet prior to discharge. Discharge with analgesics.    Consultants: Nephrology Procedures performed: Hemodialysis  Disposition: Home Diet recommendation: Renal diet   DISCHARGE MEDICATION: Allergies as of 09/06/2022       Reactions   Aspirin Nausea And Vomiting   Stomach ache   Ibuprofen Nausea And Vomiting   Stomach ache        Medication List     STOP taking these medications    acetaminophen 325 MG tablet Commonly known as: TYLENOL   metoprolol succinate 25 MG 24 hr tablet Commonly known as: TOPROL-XL   ondansetron 4 MG tablet Commonly known as: ZOFRAN   oxyCODONE-acetaminophen 5-325 MG tablet Commonly known as: PERCOCET/ROXICET       TAKE these medications    amLODipine 10 MG tablet Commonly known as: NORVASC Take 1 tablet (10 mg total) by mouth daily. What changed: when to take this   atorvastatin 20 MG tablet Commonly known as: LIPITOR Take 20 mg by mouth at bedtime.   cefdinir 300 MG capsule Commonly known as: OMNICEF Take 1 capsule (300 mg total) by mouth daily for 1 day.   cinacalcet 60 MG tablet Commonly  known as: SENSIPAR Take 60 mg by mouth every evening.   cloNIDine 0.1 MG tablet Commonly known as: CATAPRES Take 0.1 mg by mouth daily.   DIALYVITE 800 WITH ZINC 0.8 MG Tabs Take 1 tablet  by mouth daily.   hydrALAZINE 25 MG tablet Commonly known as: APRESOLINE Take 3 tablets (75 mg total) by mouth every 8 (eight) hours.   lidocaine-prilocaine cream Commonly known as: EMLA Apply 1 application topically See admin instructions. Apply small amount to access site on Tuesday, Thursday, Saturday one hour before dialysis. Cover with occlusive dressing (saran wrap)   mirtazapine 7.5 MG tablet Commonly known as: REMERON Take 7.5 mg by mouth at bedtime.   ondansetron 4 MG disintegrating tablet Commonly known as: Zofran ODT Take 1 tablet (4 mg total) by mouth every 8 (eight) hours as needed for nausea or vomiting.   oxyCODONE 5 MG immediate release tablet Commonly known as: Oxy IR/ROXICODONE Take 1 tablet (5 mg total) by mouth every 6 (six) hours as needed for up to 3 days for severe pain.   pantoprazole 40 MG tablet Commonly known as: PROTONIX Take 1 tablet (40 mg total) by mouth daily.   senna-docusate 8.6-50 MG tablet Commonly known as: Senokot-S Take 1 tablet by mouth at bedtime as needed for mild constipation.   sevelamer carbonate 800 MG tablet Commonly known as: RENVELA Take 800 mg by mouth 3 (three) times daily with meals.               Durable Medical Equipment  (From admission, onward)           Start     Ordered   09/06/22 1235  For home use only DME 4 wheeled rolling walker with seat  Once       Question Answer Comment  Patient needs a walker to treat with the following condition Community acquired pneumonia   Patient needs a walker to treat with the following condition Dyspnea on exertion      09/06/22 1234            Discharge Exam: BP (!) 159/76 (BP Location: Right Arm)   Pulse (!) 108   Temp 99.1 F (37.3 C) (Oral)   Resp 18   Ht 5\' 2"  (1.575 m)   Wt 47.2 kg   SpO2 99%   BMI 19.03 kg/m   General exam: Appears calm and comfortable Respiratory system: Clear to auscultation. Respiratory effort normal. Cardiovascular system:  S1 & S2 heard, RRR. No murmurs, rubs, gallops or clicks. Gastrointestinal system: Abdomen is nondistended, soft and nontender. Normal bowel sounds heard. Central nervous system: Alert and oriented. No focal neurological deficits. Musculoskeletal: No edema. No calf tenderness Skin: No cyanosis. No rashes Psychiatry: Judgement and insight appear normal. Mood & affect appropriate.   Condition at discharge: stable  The results of significant diagnostics from this hospitalization (including imaging, microbiology, ancillary and laboratory) are listed below for reference.   Imaging Studies: CT ABDOMEN PELVIS WO CONTRAST  Result Date: 09/03/2022 CLINICAL DATA:  Abdominal pain for 2 weeks, concern for pancreatitis. Diarrhea. EXAM: CT ABDOMEN AND PELVIS WITHOUT CONTRAST TECHNIQUE: Multidetector CT imaging of the abdomen and pelvis was performed following the standard protocol without IV contrast. RADIATION DOSE REDUCTION: This exam was performed according to the departmental dose-optimization program which includes automated exposure control, adjustment of the mA and/or kV according to patient size and/or use of iterative reconstruction technique. COMPARISON:  04/12/2021. FINDINGS: Lower chest: Coronary artery calcifications are noted. There are small bilateral  pleural effusions with atelectasis or infiltrate at the lung bases. Hepatobiliary: No focal liver abnormality is seen. No gallstones, gallbladder wall thickening, or biliary dilatation. Pancreas: No focal abnormality. There is mild dilatation of the pancreatic duct measuring up to 5 mm. No surrounding fat stranding is seen. Spleen: Normal in size without focal abnormality. Adrenals/Urinary Tract: There is thickening of the adrenal glands bilaterally with no discrete nodule. Renal atrophy is noted bilaterally. There is a cyst in the mid right kidney. No renal calculus or hydronephrosis. The bladder is decompressed Stomach/Bowel: Stomach is within normal  limits. Appendix appears normal. No evidence of bowel wall thickening, distention, or inflammatory changes. No free air or pneumatosis. Air-fluid levels are noted in the colon, compatible with history of diarrhea illness. Vascular/Lymphatic: Aortic atherosclerosis. No enlarged abdominal or pelvic lymph nodes. Reproductive: Status post hysterectomy. No adnexal masses. Other: No abdominopelvic ascites. Musculoskeletal: Degenerative changes are present in the thoracolumbar spine. No acute osseous abnormality. IMPRESSION: 1. Small bilateral pleural effusions with atelectasis or infiltrate at the lung bases. 2. Air-fluid levels in the colon, compatible with a diarrheal illness. 3. Dilatation of the pancreatic duct measuring up to 5 mm, improved from the prior exam. No surrounding inflammatory changes are seen. 4. Bilateral renal atrophy. 5. Aortic atherosclerosis. Electronically Signed   By: Brett Fairy M.D.   On: 09/03/2022 01:41   DG Chest Portable 1 View  Result Date: 09/02/2022 CLINICAL DATA:  Shortness of breath EXAM: PORTABLE CHEST 1 VIEW COMPARISON:  02/07/2021 FINDINGS: Cardiac shadow is mildly prominent. Aortic calcifications are seen. The lungs are well aerated bilaterally. Left basilar atelectasis/infiltrate with small effusion is seen. Vascular stenting is noted in the left upper arm. No bony abnormality is seen. IMPRESSION: Left basilar atelectasis/early infiltrate with small effusion. Electronically Signed   By: Inez Catalina M.D.   On: 09/02/2022 23:29    Microbiology: Results for orders placed or performed during the hospital encounter of 04/11/21  Resp Panel by RT-PCR (Flu A&B, Covid) Nasopharyngeal Swab     Status: None   Collection Time: 04/12/21  9:26 AM   Specimen: Nasopharyngeal Swab; Nasopharyngeal(NP) swabs in vial transport medium  Result Value Ref Range Status   SARS Coronavirus 2 by RT PCR NEGATIVE NEGATIVE Final    Comment: (NOTE) SARS-CoV-2 target nucleic acids are NOT  DETECTED.  The SARS-CoV-2 RNA is generally detectable in upper respiratory specimens during the acute phase of infection. The lowest concentration of SARS-CoV-2 viral copies this assay can detect is 138 copies/mL. A negative result does not preclude SARS-Cov-2 infection and should not be used as the sole basis for treatment or other patient management decisions. A negative result may occur with  improper specimen collection/handling, submission of specimen other than nasopharyngeal swab, presence of viral mutation(s) within the areas targeted by this assay, and inadequate number of viral copies(<138 copies/mL). A negative result must be combined with clinical observations, patient history, and epidemiological information. The expected result is Negative.  Fact Sheet for Patients:  EntrepreneurPulse.com.au  Fact Sheet for Healthcare Providers:  IncredibleEmployment.be  This test is no t yet approved or cleared by the [place] FDA and  has been authorized for detection and/or diagnosis of SARS-CoV-2 by FDA under an Emergency Use Authorization (EUA). This EUA will remain  in effect (meaning this test can be used) for the duration of the COVID-19 declaration under Section 564(b)(1) of the Act, 21 U.S.C.section 360bbb-3(b)(1), unless the authorization is terminated  or revoked sooner.  Influenza A by PCR NEGATIVE NEGATIVE Final   Influenza B by PCR NEGATIVE NEGATIVE Final    Comment: (NOTE) The Xpert Xpress SARS-CoV-2/FLU/RSV plus assay is intended as an aid in the diagnosis of influenza from Nasopharyngeal swab specimens and should not be used as a sole basis for treatment. Nasal washings and aspirates are unacceptable for Xpert Xpress SARS-CoV-2/FLU/RSV testing.  Fact Sheet for Patients: EntrepreneurPulse.com.au  Fact Sheet for Healthcare Providers: IncredibleEmployment.be  This test is not yet  approved or cleared by the [place] FDA and has been authorized for detection and/or diagnosis of SARS-CoV-2 by FDA under an Emergency Use Authorization (EUA). This EUA will remain in effect (meaning this test can be used) for the duration of the COVID-19 declaration under Section 564(b)(1) of the Act, 21 U.S.C. section 360bbb-3(b)(1), unless the authorization is terminated or revoked.  Performed at [facility], [address], [place], [place] [address]     Labs: CBC: Recent Labs  Lab 09/02/22 2212 09/03/22 0437 09/04/22 0855 09/05/22 0452  WBC 4.0 3.5* 4.5 3.7*  HGB 10.0* 9.8* 9.9* 9.2*  HCT 30.2* 30.2* 30.3* 27.9*  MCV 92.6 92.4 94.7 92.4  PLT 339 146* [id number]   Basic Metabolic Panel: Recent Labs  Lab 09/02/22 2212 09/03/22 0437 09/04/22 0855 09/05/22 0452 09/05/22 1503  NA 140 139 138 137  --   K 3.8 4.4 4.3 3.7  --   CL 94* 98 95* 96*  --   CO2 29 25 27 27   --   GLUCOSE 84 88 94 100*  --   BUN 13 15 10 13   --   CREATININE 7.33* 7.72* 5.76* 7.52*  --   CALCIUM 8.9 7.8* 8.4* 8.2*  --   PHOS  --   --   --   --  2.8   Liver Function Tests: Recent Labs  Lab 09/02/22 2212  AST 22  ALT 14  ALKPHOS 55  BILITOT 0.9  PROT 7.3  ALBUMIN 3.7   Discharge time spent: 35 minutes.  Signed: Cordelia Poche, MD [facility] 09/06/2022

## 2022-09-08 NOTE — Progress Notes (Signed)
Late Note Entry  Pt was d/c to home on Saturday. Contacted [facility] this morning to advise clinic of pt's d/c date and that pt should resume care today.   Melven Sartorius Renal Navigator [phone]

## 2022-10-20 ENCOUNTER — Inpatient Hospital Stay (HOSPITAL_COMMUNITY)
Admission: EM | Admit: 2022-10-20 | Discharge: 2022-10-23 | DRG: 377 | Disposition: A | Discharge status: Home / Self Care | Payer: 59 | Attending: Internal Medicine | Admitting: Internal Medicine

## 2022-10-20 ENCOUNTER — Encounter (HOSPITAL_COMMUNITY): Payer: Self-pay

## 2022-10-20 ENCOUNTER — Emergency Department (HOSPITAL_COMMUNITY): Payer: 59

## 2022-10-20 ENCOUNTER — Other Ambulatory Visit: Payer: Self-pay

## 2022-10-20 DIAGNOSIS — K2970 Gastritis, unspecified, without bleeding: Secondary | ICD-10-CM

## 2022-10-20 DIAGNOSIS — D500 Iron deficiency anemia secondary to blood loss (chronic): Secondary | ICD-10-CM

## 2022-10-20 DIAGNOSIS — K861 Other chronic pancreatitis: Secondary | ICD-10-CM | POA: Diagnosis present

## 2022-10-20 DIAGNOSIS — I120 Hypertensive chronic kidney disease with stage 5 chronic kidney disease or end stage renal disease: Secondary | ICD-10-CM | POA: Diagnosis present

## 2022-10-20 DIAGNOSIS — K529 Noninfective gastroenteritis and colitis, unspecified: Secondary | ICD-10-CM | POA: Diagnosis not present

## 2022-10-20 DIAGNOSIS — D649 Anemia, unspecified: Secondary | ICD-10-CM

## 2022-10-20 DIAGNOSIS — Z992 Dependence on renal dialysis: Secondary | ICD-10-CM

## 2022-10-20 DIAGNOSIS — Z8673 Personal history of transient ischemic attack (TIA), and cerebral infarction without residual deficits: Secondary | ICD-10-CM

## 2022-10-20 DIAGNOSIS — N250 Renal osteodystrophy: Secondary | ICD-10-CM | POA: Diagnosis present

## 2022-10-20 DIAGNOSIS — K31811 Angiodysplasia of stomach and duodenum with bleeding: Secondary | ICD-10-CM | POA: Diagnosis not present

## 2022-10-20 DIAGNOSIS — R011 Cardiac murmur, unspecified: Secondary | ICD-10-CM | POA: Diagnosis present

## 2022-10-20 DIAGNOSIS — G8929 Other chronic pain: Secondary | ICD-10-CM | POA: Diagnosis present

## 2022-10-20 DIAGNOSIS — Z681 Body mass index (BMI) 19 or less, adult: Secondary | ICD-10-CM

## 2022-10-20 DIAGNOSIS — K219 Gastro-esophageal reflux disease without esophagitis: Secondary | ICD-10-CM | POA: Diagnosis present

## 2022-10-20 DIAGNOSIS — R195 Other fecal abnormalities: Secondary | ICD-10-CM

## 2022-10-20 DIAGNOSIS — Z886 Allergy status to analgesic agent status: Secondary | ICD-10-CM

## 2022-10-20 DIAGNOSIS — F17210 Nicotine dependence, cigarettes, uncomplicated: Secondary | ICD-10-CM | POA: Diagnosis present

## 2022-10-20 DIAGNOSIS — K5520 Angiodysplasia of colon without hemorrhage: Secondary | ICD-10-CM

## 2022-10-20 DIAGNOSIS — I10 Essential (primary) hypertension: Secondary | ICD-10-CM | POA: Diagnosis present

## 2022-10-20 DIAGNOSIS — K5521 Angiodysplasia of colon with hemorrhage: Secondary | ICD-10-CM | POA: Insufficient documentation

## 2022-10-20 DIAGNOSIS — K922 Gastrointestinal hemorrhage, unspecified: Secondary | ICD-10-CM | POA: Diagnosis present

## 2022-10-20 DIAGNOSIS — Z79899 Other long term (current) drug therapy: Secondary | ICD-10-CM

## 2022-10-20 DIAGNOSIS — E785 Hyperlipidemia, unspecified: Secondary | ICD-10-CM | POA: Diagnosis present

## 2022-10-20 DIAGNOSIS — Z8249 Family history of ischemic heart disease and other diseases of the circulatory system: Secondary | ICD-10-CM

## 2022-10-20 DIAGNOSIS — N186 End stage renal disease: Secondary | ICD-10-CM | POA: Diagnosis present

## 2022-10-20 DIAGNOSIS — Z800 Family history of malignant neoplasm of digestive organs: Secondary | ICD-10-CM

## 2022-10-20 DIAGNOSIS — Z83438 Family history of other disorder of lipoprotein metabolism and other lipidemia: Secondary | ICD-10-CM

## 2022-10-20 DIAGNOSIS — K56600 Partial intestinal obstruction, unspecified as to cause: Secondary | ICD-10-CM | POA: Diagnosis present

## 2022-10-20 DIAGNOSIS — Z90710 Acquired absence of both cervix and uterus: Secondary | ICD-10-CM

## 2022-10-20 DIAGNOSIS — I2510 Atherosclerotic heart disease of native coronary artery without angina pectoris: Secondary | ICD-10-CM | POA: Diagnosis present

## 2022-10-20 DIAGNOSIS — D631 Anemia in chronic kidney disease: Secondary | ICD-10-CM | POA: Diagnosis present

## 2022-10-20 DIAGNOSIS — Z8711 Personal history of peptic ulcer disease: Secondary | ICD-10-CM

## 2022-10-20 LAB — CBC
HCT: 25 % — ABNORMAL LOW (ref 36.0–46.0)
Hemoglobin: 8 g/dL — ABNORMAL LOW (ref 12.0–15.0)
MCH: 30.2 pg (ref 26.0–34.0)
MCHC: 32 g/dL (ref 30.0–36.0)
MCV: 94.3 fL (ref 80.0–100.0)
Platelets: 204 10*3/uL (ref 150–400)
RBC: 2.65 MIL/uL — ABNORMAL LOW (ref 3.87–5.11)
RDW: 19.5 % — ABNORMAL HIGH (ref 11.5–15.5)
WBC: 3.2 10*3/uL — ABNORMAL LOW (ref 4.0–10.5)
nRBC: 0 % (ref 0.0–0.2)

## 2022-10-20 LAB — COMPREHENSIVE METABOLIC PANEL
ALT: 11 U/L (ref 0–44)
AST: 18 U/L (ref 15–41)
Albumin: 3.4 g/dL — ABNORMAL LOW (ref 3.5–5.0)
Alkaline Phosphatase: 37 U/L — ABNORMAL LOW (ref 38–126)
Anion gap: 13 (ref 5–15)
BUN: 13 mg/dL (ref 8–23)
CO2: 31 mmol/L (ref 22–32)
Calcium: 8.6 mg/dL — ABNORMAL LOW (ref 8.9–10.3)
Chloride: 94 mmol/L — ABNORMAL LOW (ref 98–111)
Creatinine, Ser: 4.4 mg/dL — ABNORMAL HIGH (ref 0.44–1.00)
GFR, Estimated: 10 mL/min — ABNORMAL LOW (ref >60)
Glucose, Bld: 90 mg/dL (ref 70–99)
Potassium: 3.9 mmol/L (ref 3.5–5.1)
Sodium: 138 mmol/L (ref 135–145)
Total Bilirubin: 0.4 mg/dL (ref 0.3–1.2)
Total Protein: 6.5 g/dL (ref 6.5–8.1)

## 2022-10-20 LAB — TYPE AND SCREEN
ABO/RH(D): A NEG
Antibody Screen: NEGATIVE

## 2022-10-20 LAB — LIPASE, BLOOD: Lipase: 45 U/L (ref 11–51)

## 2022-10-20 LAB — POC OCCULT BLOOD, ED: Fecal Occult Bld: POSITIVE — AB

## 2022-10-20 MED ORDER — ONDANSETRON HCL 4 MG/2ML IJ SOLN
4.0000 mg | Freq: Four times a day (QID) | INTRAMUSCULAR | Status: DC | PRN
  Administered 2022-10-22: 4 mg via INTRAVENOUS
  Filled 2022-10-20: qty 2

## 2022-10-20 MED ORDER — ACETAMINOPHEN 325 MG PO TABS: 650.0000 mg | ORAL_TABLET | Freq: Four times a day (QID) | ORAL | Status: DC | PRN

## 2022-10-20 MED ORDER — PANTOPRAZOLE SODIUM 40 MG IV SOLR
40.0000 mg | Freq: Once | INTRAVENOUS | Status: AC
  Administered 2022-10-20: 40 mg via INTRAVENOUS
  Filled 2022-10-20: qty 10

## 2022-10-20 MED ORDER — PANTOPRAZOLE SODIUM 40 MG IV SOLR
40.0000 mg | Freq: Two times a day (BID) | INTRAVENOUS | Status: DC
  Administered 2022-10-21 (×2): 40 mg via INTRAVENOUS
  Filled 2022-10-20 (×2): qty 10

## 2022-10-20 MED ORDER — ONDANSETRON HCL 4 MG PO TABS: 4.0000 mg | ORAL_TABLET | Freq: Four times a day (QID) | ORAL | Status: DC | PRN

## 2022-10-20 MED ORDER — ACETAMINOPHEN 650 MG RE SUPP: 650.0000 mg | Freq: Four times a day (QID) | RECTAL | Status: DC | PRN

## 2022-10-20 MED ORDER — SENNOSIDES-DOCUSATE SODIUM 8.6-50 MG PO TABS: 1.0000 | ORAL_TABLET | Freq: Every evening | ORAL | Status: DC | PRN

## 2022-10-20 MED ORDER — HYDROMORPHONE HCL 1 MG/ML IJ SOLN
1.0000 mg | Freq: Once | INTRAMUSCULAR | Status: AC
  Administered 2022-10-20: 1 mg via INTRAVENOUS
  Filled 2022-10-20: qty 1

## 2022-10-20 MED ORDER — IOHEXOL 350 MG/ML SOLN
75.0000 mL | Freq: Once | INTRAVENOUS | Status: AC | PRN
  Administered 2022-10-20: 75 mL via INTRAVENOUS

## 2022-10-20 MED ORDER — MORPHINE SULFATE (PF) 2 MG/ML IV SOLN
2.0000 mg | Freq: Once | INTRAVENOUS | Status: AC
  Administered 2022-10-20: 2 mg via INTRAVENOUS
  Filled 2022-10-20: qty 1

## 2022-10-20 NOTE — ED Notes (Signed)
Pt received for care at 1910.  Quietly resting in bed; respirations even and unlabored.  This RN introduced self to pt and updated pt on current plan of care.  Bed in lowest position, wheels locked.  Call bell within reach.

## 2022-10-20 NOTE — H&P (Signed)
History and Physical    Ashley [name] DOB: [birth year] DOA: 10/20/2022  PCP: Raymon Mutton., FNP  Patient coming from: Home  I have personally briefly reviewed patient's old medical records in [facility]  Chief Complaint: Abdominal pain  HPI: Ashley [name] is a 72 y.o. female with medical history significant for ESRD on MWF HD, HTN, history of CVA, remote perforated PUD, HLD, chronic pancreatitis who presents to the ED for evaluation of abdominal pain and dark stools.  Patient states for over the last 2 weeks she has noticed dark black loose stools.  She says she does not take any blood thinners and avoids NSAIDs due to history of stomach ulcers.  Last few days she has been having poor appetite.  Anything she tries to eat runs through her causing diarrhea.  She has had nonradiating mid abdominal pain.  She has had some nausea but no significant emesis.  She completed her full dialysis session earlier today.  She no longer makes urine.  She denies fevers, chills, diaphoresis, chest pain, dyspnea.  ED Course  Labs/Imaging on admission: I have personally reviewed following labs and imaging studies.  Initial vitals showed BP 135/62, pulse 78, RR 16, temp 98.4 F, SpO2 98% on room air.  Labs show WBC 3.2, hemoglobin 8.0 (baseline between 9-10), platelets 204,000, sodium 138, potassium 3.9, bicarb 31, BUN 13, creatinine 4.40, serum glucose 90, lipase 45.  FOBT is positive.  CT abdomen/pelvis with contrast shows gastroenteritis with mild diffuse dilatation of the small bowel without visible transition.  Findings could be due to ileus with enteritis and/or enteritis with a low-grade SBO.  No free air or pneumatosis.  Aortic and coronary artery atherosclerosis, mild cardiomegaly, mild body wall anasarca, chronic mild prominence of the main pancreatic duct of 4 mm also noted.  Patient was given IV Protonix 40 mg, IV morphine 2 mg, IV Dilaudid 1 mg.  EDPA notified GI, Dr.  Lavon Paganini via secure chat.  The hospitalist service was consulted to admit for further evaluation and management.  Review of Systems: All systems reviewed and are negative except as documented in history of present illness above.   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  genotype 2B.  Epclusa started early 04/2020.     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  Chronic microvascular ischemic changes w parenchymal volume loss. Chronic white matter periventricular microhemorrhage, likely due to htn   Ulcer 1990   gastric ulcer. Ruptured s/p emergency repair    Past Surgical History:  Procedure Laterality Date   ABDOMINAL HYSTERECTOMY  1979   AV FISTULA PLACEMENT Left 06/16/2017   Procedure: ARTERIOVENOUS (AV) FISTULA CREATION LEFT ARM;  Surgeon: Fransisco Hertz, MD;  Location: [facility] OR;  Service: Vascular;  Laterality: Left;   BASCILIC VEIN TRANSPOSITION Left 10/02/2017  Procedure: BASILIC VEIN TRANSPOSITION SECOND STAGE LEFT ARM;  Surgeon: Larina Earthly, MD;  Location: [facility] OR;  Service: Vascular;  Laterality: Left;   BIOPSY  09/06/2019   Procedure: BIOPSY;  Surgeon: Iva Boop, MD;  Location: [facility] ENDOSCOPY;  Service: Endoscopy;;   ESOPHAGOGASTRODUODENOSCOPY N/A 05/29/2013   Procedure: ESOPHAGOGASTRODUODENOSCOPY (EGD);  Surgeon: Beverley Fiedler, MD;  Location: [facility] ENDOSCOPY;  Service: Endoscopy;  Laterality: N/A;   ESOPHAGOGASTRODUODENOSCOPY   05/2013   for epigastric pain.  Nonobstructing Schatzki ring at GEJ, mild gastropathy, nonbleeding AVMs in bulb and D2. 5 mm sessile polyp in bulb.   ESOPHAGOGASTRODUODENOSCOPY (EGD) WITH PROPOFOL N/A 09/06/2019   Procedure: ESOPHAGOGASTRODUODENOSCOPY (EGD) WITH PROPOFOL;  Surgeon: Iva Boop, MD;  Location[facility] ENDOSCOPY;  Service: Endoscopy;  Laterality: N/A;   ESOPHAGOGASTRODUODENOSCOPY (EGD) WITH PROPOFOL N/A 02/02/2020   Procedure: ESOPHAGOGASTRODUODENOSCOPY (EGD) WITH PROPOFOL;  Surgeon: Rachael Fee, MD;  Location: [facility] ENDOSCOPY;  Service: Endoscopy;  Laterality: N/A;   ESOPHAGOGASTRODUODENOSCOPY (EGD) WITH PROPOFOL N/A 02/07/2021   Procedure: ESOPHAGOGASTRODUODENOSCOPY (EGD) WITH PROPOFOL;  Surgeon: Rachael Fee, MD;  Location: [facility] ENDOSCOPY;  Service: Endoscopy;  Laterality: N/A;   EUS N/A 02/02/2020   Procedure: UPPER ENDOSCOPIC ULTRASOUND (EUS) RADIAL;  Surgeon: Rachael Fee, MD;  Location: [facility] ENDOSCOPY;  Service: Endoscopy;  Laterality: N/A;   EXCHANGE OF A DIALYSIS CATHETER Left 07/31/2017   Procedure: Removal  OF A  Right GroinTUNNELED  DIALYSIS CATHETER ,  Insertion of Left Femoral Dialysis Catheter.;  Surgeon: Larina Earthly, MD;  Location: [facility] OR;  Service: Vascular;  Laterality: Left;   HEMOSTASIS CLIP PLACEMENT  02/07/2021   Procedure: HEMOSTASIS CLIP PLACEMENT;  Surgeon: Rachael Fee, MD;  Location: [facility] ENDOSCOPY;  Service: Endoscopy;;   HOT HEMOSTASIS N/A 09/06/2019   Procedure: HOT HEMOSTASIS (ARGON PLASMA COAGULATION/BICAP);  Surgeon: Iva Boop, MD;  Location: [facility] ENDOSCOPY;  Service: Endoscopy;  Laterality: N/A;   HOT HEMOSTASIS N/A 02/07/2021   Procedure: HOT HEMOSTASIS (ARGON PLASMA COAGULATION/BICAP);  Surgeon: Rachael Fee, MD;  Location: [facility] ENDOSCOPY;  Service: Endoscopy;  Laterality: N/A;   INSERTION OF DIALYSIS CATHETER Right 06/16/2017   Procedure: INSERTION OF DIALYSIS CATHETER;  Surgeon: Fransisco Hertz, MD;  Location: [facility] OR;  Service: Vascular;   Laterality: Right;   IR AV DIALY SHUNT INTRO NEEDLE/INTRACATH INITIAL W/PTA/IMG LEFT  06/21/2018   REPAIR OF PERFORATED ULCER  1990   gastric ulcer    Social History:  reports that she has been smoking cigarettes. She has a 10.00 pack-year smoking history. She has never used smokeless tobacco. She reports that she does not currently use drugs after having used the following drugs: Heroin, Marijuana, and Cocaine. She reports that she does not drink alcohol.  Allergies  Allergen Reactions   Aspirin Nausea And Vomiting    Stomach ache   Ibuprofen Nausea And Vomiting    Stomach ache    Family History  Problem Relation Age of Onset   Hypertension Father    Cancer Father    Hyperlipidemia Father    Seizures Sister    Early death Daughter    Kidney disease Daughter        end stage dialysis dependent      Prior to Admission medications   Medication Sig Start Date End Date Taking? Authorizing Provider  amLODipine (NORVASC) 10 MG tablet Take 1 tablet (10 mg total) by mouth daily. Patient taking differently: Take 10 mg by mouth daily at 12 noon. 11/11/16   Pincus Large,  DO  atorvastatin (LIPITOR) 20 MG tablet Take 20 mg by mouth at bedtime. 08/05/20   [provider]  B Complex-C-Zn-Folic Acid (DIALYVITE 800 WITH ZINC) 0.8 MG TABS Take 1 tablet by mouth daily.  07/07/19   [provider]  cinacalcet (SENSIPAR) 60 MG tablet Take 60 mg by mouth every evening. 03/23/21   [provider]  cloNIDine (CATAPRES) 0.1 MG tablet Take 0.1 mg by mouth daily. 06/25/19   [provider]  hydrALAZINE (APRESOLINE) 25 MG tablet Take 3 tablets (75 mg total) by mouth every 8 (eight) hours. 11/14/20 09/04/22  [place], Alvira Philips, DO  lidocaine-prilocaine (EMLA) cream Apply 1 application topically See admin instructions. Apply small amount to access site on Tuesday, Thursday, Saturday one hour before dialysis. Cover with occlusive dressing (saran wrap) Patient not taking: Reported  on 09/04/2022 08/25/19   [provider]  mirtazapine (REMERON) 7.5 MG tablet Take 7.5 mg by mouth at bedtime.    [provider]  ondansetron (ZOFRAN ODT) 4 MG disintegrating tablet Take 1 tablet (4 mg total) by mouth every 8 (eight) hours as needed for nausea or vomiting. 04/10/21   Pricilla Loveless, MD  pantoprazole (PROTONIX) 40 MG tablet Take 1 tablet (40 mg total) by mouth daily. Patient not taking: Reported on 09/04/2022 11/14/20 04/13/21  [place], Alvira Philips, DO  senna-docusate (SENOKOT-S) 8.6-50 MG tablet Take 1 tablet by mouth at bedtime as needed for mild constipation. 04/16/21   Regalado, Belkys A, MD  sevelamer carbonate (RENVELA) 800 MG tablet Take 800 mg by mouth 3 (three) times daily with meals. 07/23/20   [provider]    Physical Exam: Vitals:   10/20/22 1657 10/20/22 2008 10/20/22 2110 10/20/22 2254  BP: 135/62 (!) 156/63  (!) 149/66  Pulse: 78 78  78  Resp: 16 16  17   Temp: 98.4 F (36.9 C)  98.8 F (37.1 C)   TempSrc:   Oral   SpO2: 98% 100%  97%  Weight: 46.3 kg     Height: 5\' 2"  (1.575 m)      Constitutional: Elderly woman resting in bed, NAD, calm, comfortable Eyes: EOMI, lids and conjunctivae normal ENMT: Mucous membranes are moist. Posterior pharynx clear of any exudate or lesions.Normal dentition.  Neck: normal, supple, no masses. Respiratory: clear to auscultation bilaterally, no wheezing, no crackles. Normal respiratory effort. No accessory muscle use.  Cardiovascular: Regular rate and rhythm, no murmurs / rubs / gallops. No extremity edema. 2+ pedal pulses.  LUE AVF with palpable thrill. Abdomen: Generalized tenderness to palpation, no masses palpated.  Musculoskeletal: no clubbing / cyanosis. No joint deformity upper and lower extremities. Good ROM, no contractures. Normal muscle tone.  Skin: no rashes, lesions, ulcers. No induration Neurologic: Sensation intact. Strength 5/5 in all 4.  Psychiatric: Alert and oriented x 3. Normal mood.    EKG: Not performed.  Assessment/Plan Principal Problem:   Gastroenteritis Active Problems:   Acute on chronic anemia   Hypertension   ESRD on hemodialysis (HCC)   History of CVA (cerebrovascular accident)   Ashley [name] is a 72 y.o. female with medical history significant for ESRD on MWF HD, HTN, history of CVA, remote perforated PUD, HLD, chronic pancreatitis who is admitted with gastroenteritis and acute on chronic anemia. *** Assessment and Plan: Gastroenteritis: CT with findings of gastroenteritis as well as possible ileus with enteritis and/or enteritis with low-grade SBO.  She is not having any active nausea or vomiting.  Reports loose stools only when she tries  to eat. -Keep n.p.o. -Supportive care with analgesics and antiemetics as needed  Acute on chronic normocytic anemia: Hemoglobin 8.0 compared to baseline between 9-10.  She reports black stools over the last 2 weeks.  FOBT is positive.  She does not use NSAIDs and does not take any blood thinners including aspirin.  Prior history of perforated gastric ulcer. 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  Endoclip was placed to one of the lesions. -Continue IV Protonix 40 mg BID -Repeat CBC in a.m., no indication for transfusion at this time -[facility] notified by EDP via secure chat for routine consult  ESRD on MWF HD: Completed full regular dialysis session today.  Will need nephrology consult for usual HD next due 5/15.  Hypertension: ***  History of CVA: Continue atorvastatin.  Not on aspirin due to her history of GI bleed.   DVT prophylaxis: SCDs Start: 10/20/22 2247 Code Status: Full code, confirmed with patient on admission Family Communication: Discussed with patient, she has discussed with family Disposition Plan: From home and likely discharge to home pending clinical progress Consults called: EDP notified [facility] via secure chat Severity of  Illness: The appropriate patient status for this patient is OBSERVATION. Observation status is judged to be reasonable and necessary in order to provide the required intensity of service to ensure the patient's safety. The patient's presenting symptoms, physical exam findings, and initial radiographic and laboratory data in the context of their medical condition is felt to place them at decreased risk for further clinical deterioration. Furthermore, it is anticipated that the patient will be medically stable for discharge from the hospital within 2 midnights of admission.   Darreld Mclean MD [facility]  If 7PM-7AM, please contact night-coverage [email or web address]  10/20/2022, 11:31 PM

## 2022-10-20 NOTE — ED Triage Notes (Signed)
Patient reports blood in stool x 2 weeks and now it has got to the point she can't eat.  Also reports abd pain.

## 2022-10-20 NOTE — ED Notes (Signed)
ED TO INPATIENT HANDOFF REPORT  ED Nurse Name and Phone #: Joselyn Glassman [phone])  S Name/Age/Gender Ashley [name] 72 y.o. female Room/Bed: 025C/025C  Code Status   Code Status: Full Code  Home/SNF/Other Home Patient oriented to: self, place, time, and situation Is this baseline? Yes   Triage Complete: Triage complete  Chief Complaint Gastroenteritis [K52.9]  Triage Note Patient reports blood in stool x 2 weeks and now it has got to the point she can't eat.  Also reports abd pain.    Allergies Allergies  Allergen Reactions   Aspirin Nausea And Vomiting    Stomach ache   Ibuprofen Nausea And Vomiting    Stomach ache    Level of Care/Admitting Diagnosis ED Disposition     ED Disposition  Admit   Condition  --   Comment  Hospital Area: [facility] [[id number]]  Level of Care: Med-Surg [16]  May place patient in observation at [facility] or [facility] if equivalent level of care is available:: No  Covid Evaluation: Asymptomatic - no recent exposure (last 10 days) testing not required  Diagnosis: Gastroenteritis [[id number]]  Admitting Physician: Charlsie Quest [[id number]]  Attending Physician: Charlsie Quest [[id number]]          B Medical/Surgery History Past Medical History:  Diagnosis Date   Acute pancreatitis 2000   2000, 12/2018, 08/2019   Arthritis    Cervical radiculopathy 02/28/2011   Cocaine substance abuse (HCC) 05/26/2013   positive UDS    Duodenitis    Erosive gastropathy    ESRD on hemodialysis (HCC)    TTS   GERD (gastroesophageal reflux disease)    Hepatitis C 1987   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  genotype 2B.  Epclusa started early 04/2020.     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  Chronic  microvascular ischemic changes w parenchymal volume loss. Chronic white matter periventricular microhemorrhage, likely due to htn   Ulcer 1990   gastric ulcer. Ruptured s/p emergency repair   Past Surgical History:  Procedure Laterality Date   ABDOMINAL HYSTERECTOMY  1979   AV FISTULA PLACEMENT Left 06/16/2017   Procedure: ARTERIOVENOUS (AV) FISTULA CREATION LEFT ARM;  Surgeon: Fransisco Hertz, MD;  Location: [facility] OR;  Service: Vascular;  Laterality: Left;   BASCILIC VEIN TRANSPOSITION Left 10/02/2017   Procedure: BASILIC VEIN TRANSPOSITION SECOND STAGE LEFT ARM;  Surgeon: Larina Earthly, MD;  Location: [facility] OR;  Service: Vascular;  Laterality: Left;   BIOPSY  09/06/2019   Procedure: BIOPSY;  Surgeon: Iva Boop, MD;  Location[facility] ENDOSCOPY;  Service: Endoscopy;;   ESOPHAGOGASTRODUODENOSCOPY N/A 05/29/2013   Procedure: ESOPHAGOGASTRODUODENOSCOPY (EGD);  Surgeon: Beverley Fiedler, MD;  Location: [facility] ENDOSCOPY;  Service: Endoscopy;  Laterality: N/A;   ESOPHAGOGASTRODUODENOSCOPY  05/2013   for epigastric pain.  Nonobstructing Schatzki ring at GEJ, mild gastropathy, nonbleeding AVMs in bulb and D2. 5 mm sessile polyp in bulb.   ESOPHAGOGASTRODUODENOSCOPY (EGD) WITH PROPOFOL N/A 09/06/2019   Procedure: ESOPHAGOGASTRODUODENOSCOPY (EGD) WITH PROPOFOL;  Surgeon: Iva Boop, MD;  Location: [facility] ENDOSCOPY;  Service: Endoscopy;  Laterality: N/A;   ESOPHAGOGASTRODUODENOSCOPY (EGD) WITH PROPOFOL N/A 02/02/2020   Procedure: ESOPHAGOGASTRODUODENOSCOPY (EGD) WITH PROPOFOL;  Surgeon: Rachael Fee,  MD;  Location: [facility] ENDOSCOPY;  Service: Endoscopy;  Laterality: N/A;   ESOPHAGOGASTRODUODENOSCOPY (EGD) WITH PROPOFOL N/A 02/07/2021   Procedure: ESOPHAGOGASTRODUODENOSCOPY (EGD) WITH PROPOFOL;  Surgeon: Rachael Fee, MD;  Location: [facility] ENDOSCOPY;  Service: Endoscopy;  Laterality: N/A;   EUS N/A 02/02/2020   Procedure: UPPER ENDOSCOPIC ULTRASOUND (EUS) RADIAL;  Surgeon: Rachael Fee, MD;  Location: [facility] ENDOSCOPY;   Service: Endoscopy;  Laterality: N/A;   EXCHANGE OF A DIALYSIS CATHETER Left 07/31/2017   Procedure: Removal  OF A  Right GroinTUNNELED  DIALYSIS CATHETER ,  Insertion of Left Femoral Dialysis Catheter.;  Surgeon: Larina Earthly, MD;  Location: [facility] OR;  Service: Vascular;  Laterality: Left;   HEMOSTASIS CLIP PLACEMENT  02/07/2021   Procedure: HEMOSTASIS CLIP PLACEMENT;  Surgeon: Rachael Fee, MD;  Location: [facility] ENDOSCOPY;  Service: Endoscopy;;   HOT HEMOSTASIS N/A 09/06/2019   Procedure: HOT HEMOSTASIS (ARGON PLASMA COAGULATION/BICAP);  Surgeon: Iva Boop, MD;  Location: [facility] ENDOSCOPY;  Service: Endoscopy;  Laterality: N/A;   HOT HEMOSTASIS N/A 02/07/2021   Procedure: HOT HEMOSTASIS (ARGON PLASMA COAGULATION/BICAP);  Surgeon: Rachael Fee, MD;  Location: [facility] ENDOSCOPY;  Service: Endoscopy;  Laterality: N/A;   INSERTION OF DIALYSIS CATHETER Right 06/16/2017   Procedure: INSERTION OF DIALYSIS CATHETER;  Surgeon: Fransisco Hertz, MD;  Location: [facility] OR;  Service: Vascular;  Laterality: Right;   IR AV DIALY SHUNT INTRO NEEDLE/INTRACATH INITIAL W/PTA/IMG LEFT  06/21/2018   REPAIR OF PERFORATED ULCER  1990   gastric ulcer     A IV Location/Drains/Wounds Patient Lines/Drains/Airways Status     Active Line/Drains/Airways     Name Placement date Placement time Site Days   Peripheral IV 10/20/22 20 G Right Forearm 10/20/22  2003  Forearm  less than 1   Fistula / Graft Left Upper arm Arteriovenous fistula --  --  Upper arm  --            Intake/Output Last 24 hours No intake or output data in the 24 hours ending 10/20/22 2300  Labs/Imaging Results for orders placed or performed during the hospital encounter of 10/20/22 (from the past 48 hour(s))  Comprehensive metabolic panel     Status: Abnormal   Collection Time: 10/20/22  5:00 PM  Result Value Ref Range   Sodium 138 135 - 145 mmol/L   Potassium 3.9 3.5 - 5.1 mmol/L   Chloride 94 (L) 98 - 111 mmol/L   CO2 31 22 - 32 mmol/L   Glucose,  Bld 90 70 - 99 mg/dL    Comment: Glucose reference range applies only to samples taken after fasting for at least 8 hours.   BUN 13 8 - 23 mg/dL   Creatinine, Ser 8.29 (H) 0.44 - 1.00 mg/dL   Calcium 8.6 (L) 8.9 - 10.3 mg/dL   Total Protein 6.5 6.5 - 8.1 g/dL   Albumin 3.4 (L) 3.5 - 5.0 g/dL   AST 18 15 - 41 U/L   ALT 11 0 - 44 U/L   Alkaline Phosphatase 37 (L) 38 - 126 U/L   Total Bilirubin 0.4 0.3 - 1.2 mg/dL   GFR, Estimated 10 (L) >60 mL/min    Comment: (NOTE) Calculated using the CKD-EPI Creatinine Equation (2021)    Anion gap 13 5 - 15    Comment: Performed at [facility], [address] [place], [place] [address]  CBC     Status: Abnormal   Collection Time: 10/20/22  5:00 PM  Result Value Ref  Range   WBC 3.2 (L) 4.0 - 10.5 K/uL   RBC 2.65 (L) 3.87 - 5.11 MIL/uL   Hemoglobin 8.0 (L) 12.0 - 15.0 g/dL   HCT 16.1 (L) 09.6 - 04.5 %   MCV 94.3 80.0 - 100.0 fL   MCH 30.2 26.0 - 34.0 pg   MCHC 32.0 30.0 - 36.0 g/dL   RDW 40.9 (H) 81.1 - 91.4 %   Platelets 204 150 - 400 K/uL   nRBC 0.0 0.0 - 0.2 %    Comment: Performed at [facility], [address] [place], [place] [address]  Type and screen [facility]     Status: None   Collection Time: 10/20/22  5:05 PM  Result Value Ref Range   ABO/RH(D) A NEG    Antibody Screen NEG    Sample Expiration      10/23/2022,2359 Performed at [facility], [address], [place], [place] [address]   Lipase, blood     Status: None   Collection Time: 10/20/22  8:07 PM  Result Value Ref Range   Lipase 45 11 - 51 U/L    Comment: Performed at [facility], [address], [place], [place] [address]  POC occult blood, ED     Status: Abnormal   Collection Time: 10/20/22  8:23 PM  Result Value Ref Range   Fecal Occult Bld POSITIVE (A) NEGATIVE   CT ABDOMEN PELVIS W CONTRAST  Result Date: 10/20/2022 CLINICAL DATA:  Nonlocalized abdominal pain. EXAM: CT ABDOMEN AND PELVIS WITH CONTRAST TECHNIQUE:  Multidetector CT imaging of the abdomen and pelvis was performed using the standard protocol following bolus administration of intravenous contrast. RADIATION DOSE REDUCTION: This exam was performed according to the departmental dose-optimization program which includes automated exposure control, adjustment of the mA and/or kV according to patient size and/or use of iterative reconstruction technique. CONTRAST:  75mL OMNIPAQUE IOHEXOL 350 MG/ML SOLN COMPARISON:  CT without contrast 09/03/2022, CT with contrast 04/12/2021 FINDINGS: Lower chest: There are mild chronic interstitial changes in the posterior lung bases. Lung bases are clear of infiltrates. There is a posterior left diaphragmatic fat herniation. The heart is slightly enlarged. There are coronary artery calcifications. Hepatobiliary: No focal liver abnormality is seen. No calcified gallstones, gallbladder wall thickening, or biliary dilatation. The main portal vein is upper limit of normal in caliber. Pancreas: The main pancreatic duct is slightly prominent at 4 mm, unchanged. There is no pancreatic mass enhancement. No adjacent inflammatory changes. Spleen: No abnormality. Adrenals/Urinary Tract: Symmetric adrenal hyperplasia. No mass enhancement. Both kidneys atrophic and small in length, right 5.9 cm length, left 6 cm length. There are few small simple cysts bilaterally, and multiple bilateral tiny cortical hypodensities which are too small to characterize. There is no interval change in the renal enhancement, no mass enhancement. No follow-up imaging is recommended. There is no urinary stone or obstruction. No contrast had been excreted into the collecting systems in the delayed phase on either side. The bladder is contracted and not well seen. Stomach/Bowel: Moderate diffuse gastric thickening continues to be noted particularly in the antrum although it has been seen previously. There is mild diffuse dilatation of the small bowel up to 3.2 cm but no  visible transition. There are multiple thickened small bowel segments in the mid to lower abdomen interspersed with non-thickened segments. Findings could be due to enteritis with ileus and/or enteritis with a low-grade small-bowel obstruction. The appendix is normal. There is no evidence  of acute colitis or diverticulitis. Vascular/Lymphatic: There is heavy aortoiliac calcific plaque without critical stenosis or aneurysm. There is branch vessel atherosclerosis. No abdominal or pelvic adenopathy. Reproductive: Status post hysterectomy. No adnexal masses. Other: No abdominal wall hernia or abnormality. No abdominopelvic ascites. There is no pneumatosis, no free air or free hemorrhage. Mild body wall anasarca is again shown. Musculoskeletal: There are degenerative changes of the spine. No acute or significant osseous findings. IMPRESSION: 1. Gastroenteritis with mild diffuse dilatation of the small bowel but no visible transition. Findings could be due to ileus with enteritis and/or enteritis with a low-grade small-bowel obstruction. No free air or pneumatosis. 2. Aortic and coronary artery atherosclerosis. 3. Mild cardiomegaly. 4. Mild body wall anasarca. 5. Chronic mild prominence of the main pancreatic duct at 4 mm. 6. Atrophic kidneys with small simple cysts and tiny cortical hypodensities which are too small to characterize. No interval change. No follow-up imaging recommended. Aortic Atherosclerosis (ICD10-I70.0). Electronically Signed   By: Almira Bar M.D.   On: 10/20/2022 21:16    Pending Labs Unresulted Labs (From admission, onward)     Start     Ordered   10/21/22 0500  Basic metabolic panel  Tomorrow morning,   R        10/20/22 2247   10/21/22 0500  CBC  Tomorrow morning,   R        10/20/22 2247            Vitals/Pain Today's Vitals   10/20/22 2120 10/20/22 2151 10/20/22 2253 10/20/22 2254  BP:    (!) 149/66  Pulse:    78  Resp:    17  Temp:      TempSrc:      SpO2:    97%   Weight:      Height:      PainSc: 7  9  4       Isolation Precautions No active isolations  Medications Medications  acetaminophen (TYLENOL) tablet 650 mg (has no administration in time range)    Or  acetaminophen (TYLENOL) suppository 650 mg (has no administration in time range)  ondansetron (ZOFRAN) tablet 4 mg (has no administration in time range)    Or  ondansetron (ZOFRAN) injection 4 mg (has no administration in time range)  senna-docusate (Senokot-S) tablet 1 tablet (has no administration in time range)  pantoprazole (PROTONIX) injection 40 mg (has no administration in time range)  morphine (PF) 2 MG/ML injection 2 mg (2 mg Intravenous Given 10/20/22 2007)  iohexol (OMNIPAQUE) 350 MG/ML injection 75 mL (75 mLs Intravenous Contrast Given 10/20/22 2048)  HYDROmorphone (DILAUDID) injection 1 mg (1 mg Intravenous Given 10/20/22 2226)  pantoprazole (PROTONIX) injection 40 mg (40 mg Intravenous Given 10/20/22 2226)    Mobility walks     Focused Assessments    R Recommendations: See Admitting Provider Note  Report given to:   Additional Notes: Positive Hemoccult Blood; MWF Dialysis pt

## 2022-10-20 NOTE — Hospital Course (Signed)
PMH of ESRD on HD MWF, HTN, chronic pancreatitis presented to the hospital with complaints of abdominal pain and dark black loose stool. CT abdomen shows evidence of gastroenteritis with diffuse dilation of the small bowel. GI consulted. Underwent push enteroscopy on 5/15.  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 GI currently signed off.

## 2022-10-20 NOTE — ED Provider Notes (Signed)
[place] EMERGENCY DEPARTMENT AT [facility] Provider Note   CSN: [id number] Arrival date & time: 10/20/22  1640     History  Chief Complaint  Patient presents with   Rectal Bleeding    Ashley [name] is a 72 y.o. female.  Patient presents to the emergency department complaining of generalized abdominal pain, dark black stools, and decreased appetite.  Patient states she has chronic pancreatitis and has frequent episodes of abdominal pain.  She states that for the past 2 weeks she has noticed dark black stools and over the past 2 weeks her appetite has decreased.  She states that her abdominal pain is generalized in nature today.  She rates it as moderate in severity.  She states that when she does eat she has loose bowels shortly thereafter.  The patient is in end-stage renal disease patient on hemodialysis Monday Wednesday Friday and states she had a full treatment earlier today.  She states she occasionally gets iron infusions with her hemodialysis.  Patient currently denies chest pain, shortness of breath, nausea, vomiting, weakness.  Past medical history significant for pancreatitis, peptic ulcer disease, hepatitis C, stroke, cocaine abuse, GERD  HPI     Home Medications Prior to Admission medications   Medication Sig Start Date End Date Taking? Authorizing Provider  amLODipine (NORVASC) 10 MG tablet Take 1 tablet (10 mg total) by mouth daily. Patient taking differently: Take 10 mg by mouth daily at 12 noon. 11/11/16   Pincus Large, DO  atorvastatin (LIPITOR) 20 MG tablet Take 20 mg by mouth at bedtime. 08/05/20   [provider]  B Complex-C-Zn-Folic Acid (DIALYVITE 800 WITH ZINC) 0.8 MG TABS Take 1 tablet by mouth daily.  07/07/19   [provider]  cinacalcet (SENSIPAR) 60 MG tablet Take 60 mg by mouth every evening. 03/23/21   [provider]  cloNIDine (CATAPRES) 0.1 MG tablet Take 0.1 mg by mouth daily. 06/25/19   [provider]   hydrALAZINE (APRESOLINE) 25 MG tablet Take 3 tablets (75 mg total) by mouth every 8 (eight) hours. 11/14/20 09/04/22  [place], Alvira Philips, DO  lidocaine-prilocaine (EMLA) cream Apply 1 application topically See admin instructions. Apply small amount to access site on Tuesday, Thursday, Saturday one hour before dialysis. Cover with occlusive dressing (saran wrap) Patient not taking: Reported on 09/04/2022 08/25/19   [provider]  mirtazapine (REMERON) 7.5 MG tablet Take 7.5 mg by mouth at bedtime.    [provider]  ondansetron (ZOFRAN ODT) 4 MG disintegrating tablet Take 1 tablet (4 mg total) by mouth every 8 (eight) hours as needed for nausea or vomiting. 04/10/21   Pricilla Loveless, MD  pantoprazole (PROTONIX) 40 MG tablet Take 1 tablet (40 mg total) by mouth daily. Patient not taking: Reported on 09/04/2022 11/14/20 04/13/21  [place], Alvira Philips, DO  senna-docusate (SENOKOT-S) 8.6-50 MG tablet Take 1 tablet by mouth at bedtime as needed for mild constipation. 04/16/21   Regalado, Belkys A, MD  sevelamer carbonate (RENVELA) 800 MG tablet Take 800 mg by mouth 3 (three) times daily with meals. 07/23/20   [provider]      Allergies    Aspirin and Ibuprofen    Review of Systems   Review of Systems  Physical Exam Updated Vital Signs BP (!) 156/63 (BP Location: Right Arm)   Pulse 78   Temp 98.8 F (37.1 C) (Oral)   Resp 16   Ht 5\' 2"  (1.575 m)   Wt 46.3 kg  SpO2 100%   BMI 18.66 kg/m  Physical Exam Vitals and nursing note reviewed.  Constitutional:      General: She is not in acute distress.    Appearance: She is well-developed.  HENT:     Head: Normocephalic and atraumatic.  Eyes:     Conjunctiva/sclera: Conjunctivae normal.  Cardiovascular:     Rate and Rhythm: Normal rate and regular rhythm.     Heart sounds: No murmur heard. Pulmonary:     Effort: Pulmonary effort is normal. No respiratory distress.     Breath sounds: Normal breath sounds.  Abdominal:      General: There is no distension.     Palpations: Abdomen is soft.     Tenderness: There is abdominal tenderness (Generalized).  Musculoskeletal:        General: No swelling.     Cervical back: Neck supple.  Skin:    General: Skin is warm and dry.     Capillary Refill: Capillary refill takes less than 2 seconds.  Neurological:     Mental Status: She is alert.  Psychiatric:        Mood and Affect: Mood normal.     ED Results / Procedures / Treatments   Labs (all labs ordered are listed, but only abnormal results are displayed) Labs Reviewed  COMPREHENSIVE METABOLIC PANEL - Abnormal; Notable for the following components:      Result Value   Chloride 94 (*)    Creatinine, Ser 4.40 (*)    Calcium 8.6 (*)    Albumin 3.4 (*)    Alkaline Phosphatase 37 (*)    GFR, Estimated 10 (*)    All other components within normal limits  CBC - Abnormal; Notable for the following components:   WBC 3.2 (*)    RBC 2.65 (*)    Hemoglobin 8.0 (*)    HCT 25.0 (*)    RDW 19.5 (*)    All other components within normal limits  POC OCCULT BLOOD, ED - Abnormal; Notable for the following components:   Fecal Occult Bld POSITIVE (*)    All other components within normal limits  LIPASE, BLOOD  TYPE AND SCREEN    EKG None  Radiology CT ABDOMEN PELVIS W CONTRAST  Result Date: 10/20/2022 CLINICAL DATA:  Nonlocalized abdominal pain. EXAM: CT ABDOMEN AND PELVIS WITH CONTRAST TECHNIQUE: Multidetector CT imaging of the abdomen and pelvis was performed using the standard protocol following bolus administration of intravenous contrast. RADIATION DOSE REDUCTION: This exam was performed according to the departmental dose-optimization program which includes automated exposure control, adjustment of the mA and/or kV according to patient size and/or use of iterative reconstruction technique. CONTRAST:  75mL OMNIPAQUE IOHEXOL 350 MG/ML SOLN COMPARISON:  CT without contrast 09/03/2022, CT with contrast 04/12/2021  FINDINGS: Lower chest: There are mild chronic interstitial changes in the posterior lung bases. Lung bases are clear of infiltrates. There is a posterior left diaphragmatic fat herniation. The heart is slightly enlarged. There are coronary artery calcifications. Hepatobiliary: No focal liver abnormality is seen. No calcified gallstones, gallbladder wall thickening, or biliary dilatation. The main portal vein is upper limit of normal in caliber. Pancreas: The main pancreatic duct is slightly prominent at 4 mm, unchanged. There is no pancreatic mass enhancement. No adjacent inflammatory changes. Spleen: No abnormality. Adrenals/Urinary Tract: Symmetric adrenal hyperplasia. No mass enhancement. Both kidneys atrophic and small in length, right 5.9 cm length, left 6 cm length. There are few small simple cysts bilaterally, and multiple bilateral tiny cortical  hypodensities which are too small to characterize. There is no interval change in the renal enhancement, no mass enhancement. No follow-up imaging is recommended. There is no urinary stone or obstruction. No contrast had been excreted into the collecting systems in the delayed phase on either side. The bladder is contracted and not well seen. Stomach/Bowel: Moderate diffuse gastric thickening continues to be noted particularly in the antrum although it has been seen previously. There is mild diffuse dilatation of the small bowel up to 3.2 cm but no visible transition. There are multiple thickened small bowel segments in the mid to lower abdomen interspersed with non-thickened segments. Findings could be due to enteritis with ileus and/or enteritis with a low-grade small-bowel obstruction. The appendix is normal. There is no evidence of acute colitis or diverticulitis. Vascular/Lymphatic: There is heavy aortoiliac calcific plaque without critical stenosis or aneurysm. There is branch vessel atherosclerosis. No abdominal or pelvic adenopathy. Reproductive: Status post  hysterectomy. No adnexal masses. Other: No abdominal wall hernia or abnormality. No abdominopelvic ascites. There is no pneumatosis, no free air or free hemorrhage. Mild body wall anasarca is again shown. Musculoskeletal: There are degenerative changes of the spine. No acute or significant osseous findings. IMPRESSION: 1. Gastroenteritis with mild diffuse dilatation of the small bowel but no visible transition. Findings could be due to ileus with enteritis and/or enteritis with a low-grade small-bowel obstruction. No free air or pneumatosis. 2. Aortic and coronary artery atherosclerosis. 3. Mild cardiomegaly. 4. Mild body wall anasarca. 5. Chronic mild prominence of the main pancreatic duct at 4 mm. 6. Atrophic kidneys with small simple cysts and tiny cortical hypodensities which are too small to characterize. No interval change. No follow-up imaging recommended. Aortic Atherosclerosis (ICD10-I70.0). Electronically Signed   By: Almira Bar M.D.   On: 10/20/2022 21:16    Procedures Procedures    Medications Ordered in ED Medications  HYDROmorphone (DILAUDID) injection 1 mg (has no administration in time range)  pantoprazole (PROTONIX) injection 40 mg (has no administration in time range)  morphine (PF) 2 MG/ML injection 2 mg (2 mg Intravenous Given 10/20/22 2007)  iohexol (OMNIPAQUE) 350 MG/ML injection 75 mL (75 mLs Intravenous Contrast Given 10/20/22 2048)    ED Course/ Medical Decision Making/ A&P                             Medical Decision Making Amount and/or Complexity of Data Reviewed Labs: ordered. Radiology: ordered.  Risk Prescription drug management.   This patient presents to the ED for concern of abdominal pain, this involves an extensive number of treatment options, and is a complaint that carries with it a high risk of complications and morbidity.  The differential diagnosis includes gastritis, duodenitis, perforated ulcer, cholecystitis, appendicitis, others   Co  morbidities that complicate the patient evaluation  History of duodenitis, hepatitis C, ulcers, peptic ulcer disease, hot hemostasis   Additional history obtained:   External records from outside source obtained and reviewed including discharge summary from March 30 of this year.  Patient has been admitted for acute respiratory failure with hypoxia, diarrhea listed on problem list   Lab Tests:  I Ordered, and personally interpreted labs.  The pertinent results include: Positive fecal occult blood test, hemoglobin 8.0 (baseline in the mid nines)   Imaging Studies ordered:  I ordered imaging studies including CT abdomen pelvis with contrast I independently visualized and interpreted imaging which showed  1. Gastroenteritis with mild diffuse dilatation of  the small bowel  but no visible transition. Findings could be due to ileus with  enteritis and/or enteritis with a low-grade small-bowel obstruction.  No free air or pneumatosis.  2. Aortic and coronary artery atherosclerosis.  3. Mild cardiomegaly.  4. Mild body wall anasarca.  5. Chronic mild prominence of the main pancreatic duct at 4 mm.  6. Atrophic kidneys with small simple cysts and tiny cortical  hypodensities which are too small to characterize. No interval  change.   I agree with the radiologist interpretation    Consultations Obtained:  I requested consultation with the medicine team, Dr.Patel and discussed lab and imaging findings as well as pertinent plan - they recommend: agree to see patient for admission I secure chat with Dr. Lavon Paganini, gastroenterology to make her aware of the admission   Problem List / ED Course / Critical interventions / Medication management   I ordered medication including morphine and Dilaudid for pain, protonix for likely GI bleed Reevaluation of the patient after these medicines showed that the patient improved I have reviewed the patients home medicines and have made adjustments  as needed   Test / Admission - Considered:  Patient with positive fecal occult blood test and worsening anemia. Patient with gastroenteritis on CT. Patient would benefit from admission for repeat Hgb measurements and evaluation by gastroenterology.          Final Clinical Impression(s) / ED Diagnoses Final diagnoses:  Acute GI bleeding  Gastritis, presence of bleeding unspecified, unspecified chronicity, unspecified gastritis type    Rx / DC Orders ED Discharge Orders     None         Pamala Duffel 10/20/22 2216    Gloris Manchester, MD 10/22/22 [phone]

## 2022-10-21 ENCOUNTER — Inpatient Hospital Stay (HOSPITAL_COMMUNITY): Payer: 59

## 2022-10-21 DIAGNOSIS — N186 End stage renal disease: Secondary | ICD-10-CM | POA: Diagnosis present

## 2022-10-21 DIAGNOSIS — Z83438 Family history of other disorder of lipoprotein metabolism and other lipidemia: Secondary | ICD-10-CM | POA: Diagnosis not present

## 2022-10-21 DIAGNOSIS — Z992 Dependence on renal dialysis: Secondary | ICD-10-CM

## 2022-10-21 DIAGNOSIS — K31811 Angiodysplasia of stomach and duodenum with bleeding: Secondary | ICD-10-CM | POA: Diagnosis present

## 2022-10-21 DIAGNOSIS — R195 Other fecal abnormalities: Secondary | ICD-10-CM

## 2022-10-21 DIAGNOSIS — K922 Gastrointestinal hemorrhage, unspecified: Secondary | ICD-10-CM | POA: Diagnosis not present

## 2022-10-21 DIAGNOSIS — D500 Iron deficiency anemia secondary to blood loss (chronic): Secondary | ICD-10-CM

## 2022-10-21 DIAGNOSIS — K219 Gastro-esophageal reflux disease without esophagitis: Secondary | ICD-10-CM | POA: Diagnosis present

## 2022-10-21 DIAGNOSIS — Z79899 Other long term (current) drug therapy: Secondary | ICD-10-CM | POA: Diagnosis not present

## 2022-10-21 DIAGNOSIS — Z8719 Personal history of other diseases of the digestive system: Secondary | ICD-10-CM

## 2022-10-21 DIAGNOSIS — R011 Cardiac murmur, unspecified: Secondary | ICD-10-CM | POA: Diagnosis present

## 2022-10-21 DIAGNOSIS — Z90710 Acquired absence of both cervix and uterus: Secondary | ICD-10-CM | POA: Diagnosis not present

## 2022-10-21 DIAGNOSIS — K529 Noninfective gastroenteritis and colitis, unspecified: Secondary | ICD-10-CM | POA: Diagnosis present

## 2022-10-21 DIAGNOSIS — Z681 Body mass index (BMI) 19 or less, adult: Secondary | ICD-10-CM | POA: Diagnosis not present

## 2022-10-21 DIAGNOSIS — Z8673 Personal history of transient ischemic attack (TIA), and cerebral infarction without residual deficits: Secondary | ICD-10-CM | POA: Diagnosis not present

## 2022-10-21 DIAGNOSIS — K5521 Angiodysplasia of colon with hemorrhage: Secondary | ICD-10-CM | POA: Insufficient documentation

## 2022-10-21 DIAGNOSIS — F17210 Nicotine dependence, cigarettes, uncomplicated: Secondary | ICD-10-CM | POA: Diagnosis present

## 2022-10-21 DIAGNOSIS — K56600 Partial intestinal obstruction, unspecified as to cause: Secondary | ICD-10-CM | POA: Diagnosis present

## 2022-10-21 DIAGNOSIS — D631 Anemia in chronic kidney disease: Secondary | ICD-10-CM | POA: Diagnosis present

## 2022-10-21 DIAGNOSIS — Z886 Allergy status to analgesic agent status: Secondary | ICD-10-CM | POA: Diagnosis not present

## 2022-10-21 DIAGNOSIS — K861 Other chronic pancreatitis: Secondary | ICD-10-CM

## 2022-10-21 DIAGNOSIS — I120 Hypertensive chronic kidney disease with stage 5 chronic kidney disease or end stage renal disease: Secondary | ICD-10-CM

## 2022-10-21 DIAGNOSIS — N250 Renal osteodystrophy: Secondary | ICD-10-CM | POA: Diagnosis present

## 2022-10-21 DIAGNOSIS — D649 Anemia, unspecified: Secondary | ICD-10-CM | POA: Diagnosis not present

## 2022-10-21 DIAGNOSIS — D62 Acute posthemorrhagic anemia: Secondary | ICD-10-CM

## 2022-10-21 DIAGNOSIS — I10 Essential (primary) hypertension: Secondary | ICD-10-CM | POA: Diagnosis not present

## 2022-10-21 DIAGNOSIS — E785 Hyperlipidemia, unspecified: Secondary | ICD-10-CM | POA: Diagnosis present

## 2022-10-21 DIAGNOSIS — K31819 Angiodysplasia of stomach and duodenum without bleeding: Secondary | ICD-10-CM | POA: Diagnosis not present

## 2022-10-21 DIAGNOSIS — G8929 Other chronic pain: Secondary | ICD-10-CM | POA: Diagnosis present

## 2022-10-21 DIAGNOSIS — Z8711 Personal history of peptic ulcer disease: Secondary | ICD-10-CM | POA: Diagnosis not present

## 2022-10-21 DIAGNOSIS — I2510 Atherosclerotic heart disease of native coronary artery without angina pectoris: Secondary | ICD-10-CM | POA: Diagnosis present

## 2022-10-21 DIAGNOSIS — Z800 Family history of malignant neoplasm of digestive organs: Secondary | ICD-10-CM | POA: Diagnosis not present

## 2022-10-21 DIAGNOSIS — K5520 Angiodysplasia of colon without hemorrhage: Secondary | ICD-10-CM

## 2022-10-21 LAB — CBC
HCT: 22.5 % — ABNORMAL LOW (ref 36.0–46.0)
Hemoglobin: 7.4 g/dL — ABNORMAL LOW (ref 12.0–15.0)
MCH: 30.5 pg (ref 26.0–34.0)
MCHC: 32.9 g/dL (ref 30.0–36.0)
MCV: 92.6 fL (ref 80.0–100.0)
Platelets: 188 10*3/uL (ref 150–400)
RBC: 2.43 MIL/uL — ABNORMAL LOW (ref 3.87–5.11)
RDW: 19.3 % — ABNORMAL HIGH (ref 11.5–15.5)
WBC: 3.2 10*3/uL — ABNORMAL LOW (ref 4.0–10.5)
nRBC: 0 % (ref 0.0–0.2)

## 2022-10-21 LAB — BASIC METABOLIC PANEL
Anion gap: 14 (ref 5–15)
BUN: 17 mg/dL (ref 8–23)
CO2: 27 mmol/L (ref 22–32)
Calcium: 8 mg/dL — ABNORMAL LOW (ref 8.9–10.3)
Chloride: 95 mmol/L — ABNORMAL LOW (ref 98–111)
Creatinine, Ser: 4.92 mg/dL — ABNORMAL HIGH (ref 0.44–1.00)
GFR, Estimated: 9 mL/min — ABNORMAL LOW (ref >60)
Glucose, Bld: 80 mg/dL (ref 70–99)
Potassium: 3.6 mmol/L (ref 3.5–5.1)
Sodium: 136 mmol/L (ref 135–145)

## 2022-10-21 LAB — HEPATITIS B SURFACE ANTIGEN: Hepatitis B Surface Ag: NONREACTIVE

## 2022-10-21 LAB — PHOSPHORUS: Phosphorus: 3.5 mg/dL (ref 2.5–4.6)

## 2022-10-21 MED ORDER — MORPHINE SULFATE (PF) 2 MG/ML IV SOLN
1.0000 mg | INTRAVENOUS | Status: DC | PRN
  Administered 2022-10-21 – 2022-10-22 (×7): 1 mg via INTRAVENOUS
  Filled 2022-10-21 (×7): qty 1

## 2022-10-21 MED ORDER — CHLORHEXIDINE GLUCONATE CLOTH 2 % EX PADS
6.0000 | MEDICATED_PAD | Freq: Every day | CUTANEOUS | Status: DC
  Administered 2022-10-23: 6 via TOPICAL

## 2022-10-21 MED ORDER — ATORVASTATIN CALCIUM 10 MG PO TABS
20.0000 mg | ORAL_TABLET | Freq: Every day | ORAL | Status: DC
  Administered 2022-10-21 – 2022-10-22 (×2): 20 mg via ORAL
  Filled 2022-10-21 (×2): qty 2

## 2022-10-21 MED ORDER — DOXERCALCIFEROL 4 MCG/2ML IV SOLN
3.0000 ug | INTRAVENOUS | Status: DC
  Filled 2022-10-21: qty 2

## 2022-10-21 MED ORDER — HYDROMORPHONE HCL 1 MG/ML IJ SOLN
0.5000 mg | Freq: Once | INTRAMUSCULAR | Status: AC | PRN
  Administered 2022-10-21: 0.5 mg via INTRAVENOUS
  Filled 2022-10-21: qty 0.5

## 2022-10-21 MED ORDER — HYDRALAZINE HCL 50 MG PO TABS
75.0000 mg | ORAL_TABLET | Freq: Three times a day (TID) | ORAL | Status: DC
  Administered 2022-10-21 – 2022-10-23 (×7): 75 mg via ORAL
  Filled 2022-10-21 (×7): qty 1

## 2022-10-21 MED ORDER — AMLODIPINE BESYLATE 10 MG PO TABS
10.0000 mg | ORAL_TABLET | Freq: Every day | ORAL | Status: DC
  Administered 2022-10-21 – 2022-10-23 (×3): 10 mg via ORAL
  Filled 2022-10-21 (×3): qty 1

## 2022-10-21 NOTE — H&P (View-Only) (Signed)
                                             Consultation Note   Referring Provider:  [facility] PCP: Smith, Fred A Jr., FNP Primary Gastroenterologist: Jay Pyrtle, MD        Reason for consultation: Heme + stool, anemia  DOA: 10/20/2022         Hospital Day: 2   Assessment and Plan   72 yo female with acute on chronic anemia ( hgb 9.2 >> 8) and reported several month history of black stool which is heme positive. Suspect GI bleeding secondary to know small bowel (duodenal) AVMs -Iron studies 09/06/22 not consistent with iron deficiency -Will most likely need another EGD or enteroscopy to evaluate / treat small bowel AVMs. The risks and benefits of EGD with possible biopsies were discussed with the patient who agrees to proceed.  -Consider colonoscopy at some point - sounds she has never had one -Continue BID IV PPI for now.   -Ok for clear liquids from GI standpoint  Chronic pancreatitis confirmed by EUS. She has chronic loose stool and significant weight loss, both possibly 2/2 to exocrine pancreatic insufficiency. Stool possibly also loose due to GI bleeding -trial of Creon with snacks and meals.   Diffuse gastric wall and small bowel wall thickening on CT scan of unclear etiology.   History of HCV, treated, Negative viral load Aug 2021  ESRD on HD MWF   History of Present Illness Patient is a 72 y.o. year old female whose past medical history includes but is not necessarily limited to chronic pancreatitis, small bowel ( duodenal) AVMs, duodenitis, remote perforated PUD, HTN, CVA, ESRD on HD  Patient presented to ED yesterday for evaluation of loose, dark stool. She can't really be specific about how long the loose dark stool has been present. Initially told me it started a few weeks ago then said a long time and then settled on it being present for several months. She reports a 30 pound weight loss over a year or so. She is sometimes nausea but main issue is the chronic  loose stool. No visible blood in stool, it is just usually dark. She doesn't take bismuth or iron. No NSAID use. She takes daily pantoprazole at home. She hasn't ever had a colonoscopy. Father may of had colon cancer in his 80's.   Significant studies:  WBC 3.2, Hgb 8 ( baseline 9.2).  CT AP with contrast -  IMPRESSION: 1. Gastroenteritis with mild diffuse dilatation of the small bowel but no visible transition. Findings could be due to ileus with enteritis and/or enteritis with a low-grade small-bowel obstruction. No free air or pneumatosis. **Also, body of report mentions  Moderate diffuse gastric thickening continues to be noted particularly in the antrum although it has been seen previously. 2. Aortic and coronary artery atherosclerosis. 3. Mild cardiomegaly. 4. Mild body wall anasarca. 5. Chronic mild prominence of the main pancreatic duct at 4 mm. 6. Atrophic kidneys with small simple cysts and tiny cortical hypodensities which are too small to characterize. No interval change. No follow-up imaging recommended.   Labs and Imaging: Recent Labs    10/20/22 1700 10/21/22 0100  WBC 3.2* 3.2*  HGB 8.0* 7.4*  HCT 25.0* 22.5*  PLT 204 188   Recent Labs    10/20/22 1700 10/21/22 0100    NA 138 136  K 3.9 3.6  CL 94* 95*  CO2 31 27  GLUCOSE 90 80  BUN 13 17  CREATININE 4.40* 4.92*  CALCIUM 8.6* 8.0*   Recent Labs    10/20/22 1700  PROT 6.5  ALBUMIN 3.4*  AST 18  ALT 11  ALKPHOS 37*  BILITOT 0.4     Previous GI Evaluation:   Dec 2014 EGD  March 2021 Epigastric pain  - Erosive gastropathy with no stigmata of recent bleeding. Biopsied. - Duodenitis. Biopsied. - A single non-bleeding angiodysplastic lesion in the duodenum. Treated with argon plasma coagulation (APC). Thought reasonable to ablate as at higher risk of bleeding given comorbidities - The examination was otherwise normal.  Biopsies - peptic duodenitis.   Aug 2021 EUS - Impressive parenchymal and  ductal changes that are consistent with chronic pancreatitis throughout the gland.  Sept 2022 EGD for melena / chronic anemia - Three small angioectasias with bleeding on contact were found in the duodenal bulb and in the second portion of the duodenum. I treated each with APC cautery and they all bled during treatment. One (in the second portion of the duodenum) bled briskly enough after APC application that I decided to place a single endoclip at the site with immediate hemostasis. - The examination was otherwise normal.   Principal Problem:   Gastroenteritis Active Problems:   Hypertension   ESRD on hemodialysis (HCC)   History of CVA (cerebrovascular accident)   Acute on chronic anemia   GIB (gastrointestinal bleeding)   Past Medical History:  Diagnosis Date   Acute pancreatitis 2000   2000, 12/2018, 08/2019   Arthritis    Cervical radiculopathy 02/28/2011   Cocaine substance abuse (HCC) 05/26/2013   positive UDS    Duodenitis    Erosive gastropathy    ESRD on hemodialysis (HCC)    TTS   GERD (gastroesophageal reflux disease)    Hepatitis C 1987   dt hx IVDA.  genotype 2B.  Epclusa started early 04/2020.     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  Chronic microvascular ischemic changes w parenchymal volume loss. Chronic white matter periventricular microhemorrhage, likely due to htn   Ulcer 1990   gastric ulcer. Ruptured s/p emergency repair    Past Surgical History:  Procedure Laterality Date   ABDOMINAL HYSTERECTOMY  1979   AV FISTULA PLACEMENT Left 06/16/2017   Procedure: ARTERIOVENOUS (AV) FISTULA CREATION LEFT ARM;  Surgeon: Chen, Brian L, MD;  Location: [facility];  Service: Vascular;  Laterality: Left;   BASCILIC VEIN  TRANSPOSITION Left 10/02/2017   Procedure: BASILIC VEIN TRANSPOSITION SECOND STAGE LEFT ARM;  Surgeon: Early, Todd F, MD;  Location: [facility];  Service: Vascular;  Laterality: Left;   BIOPSY  09/06/2019   Procedure: BIOPSY;  Surgeon: Gessner, Carl E, MD;  Location: [facility];  Service: Endoscopy;;   ESOPHAGOGASTRODUODENOSCOPY N/A 05/29/2013   Procedure: ESOPHAGOGASTRODUODENOSCOPY (EGD);  Surgeon: Jay M Pyrtle, MD;  Location: [facility];  Service: Endoscopy;  Laterality: N/A;   ESOPHAGOGASTRODUODENOSCOPY  05/2013   for epigastric pain.  Nonobstructing Schatzki ring at GEJ, mild gastropathy, nonbleeding AVMs in bulb and D2. 5 mm sessile polyp in bulb.   ESOPHAGOGASTRODUODENOSCOPY (EGD) WITH PROPOFOL N/A   09/06/2019   Procedure: ESOPHAGOGASTRODUODENOSCOPY (EGD) WITH PROPOFOL;  Surgeon: Gessner, Carl E, MD;  Location: [facility];  Service: Endoscopy;  Laterality: N/A;   ESOPHAGOGASTRODUODENOSCOPY (EGD) WITH PROPOFOL N/A 02/02/2020   Procedure: ESOPHAGOGASTRODUODENOSCOPY (EGD) WITH PROPOFOL;  Surgeon: Jacobs, Daniel P, MD;  Location: [facility];  Service: Endoscopy;  Laterality: N/A;   ESOPHAGOGASTRODUODENOSCOPY (EGD) WITH PROPOFOL N/A 02/07/2021   Procedure: ESOPHAGOGASTRODUODENOSCOPY (EGD) WITH PROPOFOL;  Surgeon: Jacobs, Daniel P, MD;  Location: [facility];  Service: Endoscopy;  Laterality: N/A;   EUS N/A 02/02/2020   Procedure: UPPER ENDOSCOPIC ULTRASOUND (EUS) RADIAL;  Surgeon: Jacobs, Daniel P, MD;  Location: [facility];  Service: Endoscopy;  Laterality: N/A;   EXCHANGE OF A DIALYSIS CATHETER Left 07/31/2017   Procedure: Removal  OF A  Right GroinTUNNELED  DIALYSIS CATHETER ,  Insertion of Left Femoral Dialysis Catheter.;  Surgeon: Early, Todd F, MD;  Location: [facility];  Service: Vascular;  Laterality: Left;   HEMOSTASIS CLIP PLACEMENT  02/07/2021   Procedure: HEMOSTASIS CLIP PLACEMENT;  Surgeon: Jacobs, Daniel P, MD;  Location: [facility];  Service: Endoscopy;;   HOT HEMOSTASIS N/A 09/06/2019    Procedure: HOT HEMOSTASIS (ARGON PLASMA COAGULATION/BICAP);  Surgeon: Gessner, Carl E, MD;  Location: [facility];  Service: Endoscopy;  Laterality: N/A;   HOT HEMOSTASIS N/A 02/07/2021   Procedure: HOT HEMOSTASIS (ARGON PLASMA COAGULATION/BICAP);  Surgeon: Jacobs, Daniel P, MD;  Location: [facility];  Service: Endoscopy;  Laterality: N/A;   INSERTION OF DIALYSIS CATHETER Right 06/16/2017   Procedure: INSERTION OF DIALYSIS CATHETER;  Surgeon: Chen, Brian L, MD;  Location: [facility];  Service: Vascular;  Laterality: Right;   IR AV DIALY SHUNT INTRO NEEDLE/INTRACATH INITIAL W/PTA/IMG LEFT  06/21/2018   REPAIR OF PERFORATED ULCER  1990   gastric ulcer    Family History  Problem Relation Age of Onset   Hypertension Father    Cancer Father    Hyperlipidemia Father    Seizures Sister    Early death Daughter    Kidney disease Daughter        end stage dialysis dependent     Prior to Admission medications   Medication Sig Start Date End Date Taking? Authorizing Provider  amLODipine (NORVASC) 10 MG tablet Take 1 tablet (10 mg total) by mouth daily. Patient taking differently: Take 10 mg by mouth daily at 12 noon. 11/11/16   Phelps, Jazma Y, DO  atorvastatin (LIPITOR) 20 MG tablet Take 20 mg by mouth at bedtime. 08/05/20   [provider]  B Complex-C-Zn-Folic Acid (DIALYVITE 800 WITH ZINC) 0.8 MG TABS Take 1 tablet by mouth daily.  07/07/19   [provider]  cinacalcet (SENSIPAR) 60 MG tablet Take 60 mg by mouth every evening. 03/23/21   [provider]  cloNIDine (CATAPRES) 0.1 MG tablet Take 0.1 mg by mouth daily. 06/25/19   [provider]  hydrALAZINE (APRESOLINE) 25 MG tablet Take 3 tablets (75 mg total) by mouth every 8 (eight) hours. 11/14/20 09/04/22  [place], Eric J, DO  lidocaine-prilocaine (EMLA) cream Apply 1 application topically See admin instructions. Apply small amount to access site on Tuesday, Thursday, Saturday one hour before dialysis. Cover with  occlusive dressing (saran wrap) Patient not taking: Reported on 09/04/2022 08/25/19   [provider]  mirtazapine (REMERON) 7.5 MG tablet Take 7.5 mg by mouth at bedtime.    [provider]  ondansetron (ZOFRAN ODT) 4 MG disintegrating tablet Take 1 tablet (4 mg total) by mouth every 8 (eight) hours as needed   for nausea or vomiting. 04/10/21   Goldston, Scott, MD  pantoprazole (PROTONIX) 40 MG tablet Take 1 tablet (40 mg total) by mouth daily. Patient not taking: Reported on 09/04/2022 11/14/20 04/13/21  [place], Eric J, DO  senna-docusate (SENOKOT-S) 8.6-50 MG tablet Take 1 tablet by mouth at bedtime as needed for mild constipation. 04/16/21   Regalado, Belkys A, MD  sevelamer carbonate (RENVELA) 800 MG tablet Take 800 mg by mouth 3 (three) times daily with meals. 07/23/20   [provider]    Current Facility-Administered Medications  Medication Dose Route Frequency Provider Last Rate Last Admin   acetaminophen (TYLENOL) tablet 650 mg  650 mg Oral Q6H PRN Patel, Vishal R, MD       Or   acetaminophen (TYLENOL) suppository 650 mg  650 mg Rectal Q6H PRN Patel, Vishal R, MD       amLODipine (NORVASC) tablet 10 mg  10 mg Oral Q1200 Patel, Vishal R, MD       atorvastatin (LIPITOR) tablet 20 mg  20 mg Oral QHS Patel, Vishal R, MD       hydrALAZINE (APRESOLINE) tablet 75 mg  75 mg Oral Q8H Patel, Vishal R, MD   75 mg at 10/21/22 0653   morphine (PF) 2 MG/ML injection 1 mg  1 mg Intravenous Q3H PRN Krishnan, Sendil K, MD   1 mg at 10/21/22 1004   ondansetron (ZOFRAN) tablet 4 mg  4 mg Oral Q6H PRN Patel, Vishal R, MD       Or   ondansetron (ZOFRAN) injection 4 mg  4 mg Intravenous Q6H PRN Patel, Vishal R, MD       pantoprazole (PROTONIX) injection 40 mg  40 mg Intravenous Q12H Patel, Vishal R, MD   40 mg at 10/21/22 1003   senna-docusate (Senokot-S) tablet 1 tablet  1 tablet Oral QHS PRN Patel, Vishal R, MD        Allergies as of 10/20/2022 - Review Complete 10/20/2022   Allergen Reaction Noted   Aspirin Nausea And Vomiting 07/18/2009   Ibuprofen Nausea And Vomiting 12/24/2009    Social History   Socioeconomic History   Marital status: Widowed    Spouse name: Not on file   Number of children: 3   Years of education: Not on file   Highest education level: Not on file  Occupational History   Occupation: retired  Tobacco Use   Smoking status: Every Day    Packs/day: 0.25    Years: 40.00    Additional pack years: 0.00    Total pack years: 10.00    Types: Cigarettes   Smokeless tobacco: Never  Vaping Use   Vaping Use: Never used  Substance and Sexual Activity   Alcohol use: No    Alcohol/week: 0.0 standard drinks of alcohol   Drug use: Not Currently    Types: Heroin, Marijuana, Cocaine    Comment: hasn't used cocaine in 1-2 years; she smokes marijuana daily, "whenever I can get it"   Sexual activity: Never  Other Topics Concern   Not on file  Social History Narrative   Not on file   Social Determinants of Health   Financial Resource Strain: Not on file  Food Insecurity: No Food Insecurity (10/20/2022)   Hunger Vital Sign    Worried About Running Out of Food in the Last Year: Never true    Ran Out of Food in the Last Year: Never true  Transportation Needs: No Transportation Needs (10/20/2022)   PRAPARE - Transportation      Lack of Transportation (Medical): No    Lack of Transportation (Non-Medical): No  Physical Activity: Not on file  Stress: Not on file  Social Connections: Not on file  Intimate Partner Violence: Not At Risk (10/20/2022)   Humiliation, Afraid, Rape, and Kick questionnaire    Fear of Current or Ex-Partner: No    Emotionally Abused: No    Physically Abused: No    Sexually Abused: No     Code Status   Code Status: Full Code  Review of Systems: All systems reviewed and negative except where noted in HPI.  Physical Exam: Vital signs in last 24 hours: Temp:  [98.1 F (36.7 C)-98.8 F (37.1 C)] 98.3 F (36.8  C) (05/14 0825) Pulse Rate:  [73-79] 74 (05/14 0825) Resp:  [16-18] 16 (05/14 0825) BP: (135-156)/(51-77) 140/51 (05/14 0825) SpO2:  [90 %-100 %] 96 % (05/14 0825) Weight:  [46.3 kg-46.4 kg] 46.4 kg (05/14 0437) Last BM Date : 10/20/22  General:  Slightly agitated thin female in NAD Psych:  Cooperative. Normal mood and affect Eyes: Pupils equal Ears:  Normal auditory acuity Nose: No deformity, discharge or lesions Neck:  Supple, no masses felt Lungs:  Clear to auscultation.  Heart:  Regular rate, regular rhythm. Loud murmur present Abdomen:  Soft, nondistended, nontender, active bowel sounds, no masses felt Rectal :  Deferred Msk: Symmetrical without gross deformities.  Neurologic:  Alert, oriented, grossly normal neurologically Extremities : No edema Skin:  Intact without significant lesions.    Intake/Output from previous day: No intake/output data recorded. Intake/Output this shift:  No intake/output data recorded.     Paula Guenther, NP-C @  10/21/2022, 11:15 AM  I have taken an interval history, thoroughly reviewed the chart and examined the patient. I agree with the Advanced Practitioner's note, impression and recommendations, and have recorded additional findings, impressions and recommendations below. I performed a substantive portion of this encounter (>50% time spent), including a complete performance of the medical decision making.  My additional thoughts are as follows:  72-year-old woman with end-stage renal disease on dialysis and history of small bowel AVMs, here with acute on chronic anemia and heme positive stool.  Suspect recurrent small bowel or gastric AVMs.  Chronic diarrhea and weight loss, suspect exocrine pancreatic insufficiency.  Will start pancreatic enzymes at the time of discharge  Abnormal imaging GI, questionable focal small bowel wall thickening and some gastric wall thickening.   Plans for small bowel enteroscopy, perhaps tomorrow if can  be coordinated between endoscopy and the patient's dialysis, if not then the day following.  Discussed that with her in detail along with risk and benefits and she was agreeable.  The benefits and risks of the planned procedure were described in detail with the patient or (when appropriate) their health care proxy.  Risks were outlined as including, but not limited to, bleeding, infection, perforation, adverse medication reaction leading to cardiac or pulmonary decompensation, pancreatitis (if ERCP).  The limitation of incomplete mucosal visualization was also discussed.  No guarantees or warranties were given.  Ahan Eisenberger L Danis III Office:[phone]    

## 2022-10-21 NOTE — Progress Notes (Signed)
[facility] Progress Note  Patient: Ashley [name]    ZOX:[id number]  DOA: 10/20/2022    Date of Service: the patient was seen and examined on 10/21/2022  Brief hospital course: 72 year old female with past medical history of end-stage renal disease on hemodialysis Monday/Wednesday/Friday, hypertension and chronic pancreatitis who presented to the emergency room on 5/13 afternoon with complaints of abdominal pain and dark black loose stools.  Patient reports a poor appetite and states that in the past 2 days, anything she eats causes nonradiating and mid abdomen pain plus diarrhea.  In the emergency room, CT of abdomen/pelvis noted gastroenteritis with mild diffuse dilatation of small bowel without visible transition.  Findings thought to be enteritis causing either low-grade SBO versus ileus.  Patient also noted by lab work to have a hemoglobin of 8.0 compared to baseline between 9-10.  Hemoccult positive and patient had an EGD done in September 2022 noting bleeding duodenal angiectasias treated with cautery and Endo Clip.  Gastroenterology consulted.   Assessment and Plan: Suspect GI bleed in patient with anemia of chronic disease and previous GI bleed: Hemoglobin down to 7.4 this morning.  Gastroenterology to see.  Hemoccult positive with previous history of bleeding duodenal angiectasias.  Keep NPO.  Keep NPO.  Transfuse for hemoglobin above 7.  IV PPI.  Nausea/vomiting/abdominal pain/diarrhea caused by enteritis causing SBO versus ileus: Repeat abdominal x-ray notes improvement with contrast passing through.  Have started clear liquids.  Will hold off on advancing diet any further until seen by GI with decision to be made by scope  Chronic pancreatitis: Patient states her abdominal pain has been going on for months.  May be due to poor p.o. intake and needing pancreas enzymes.  End-stage renal disease: Nephrology consulted, completed full course of dialysis on 5/13.  Next session scheduled  5/15.  Hypertension: Blood pressure is elevated, in the context of needing her dialysis.  Home medications continued.  Hyperlipidemia: Continue statin  Nutritional status: BMI of 18.7.  Will ask nutrition to see.  Patient with history of chronic pancreatitis and with chronic pain so poor p.o. intake.  She may need pancreas enzymes  Body mass index is 18.71 kg/m.        Consultants: Gastroenterology Nephrology  Procedures: Will need hemodialysis  Antimicrobials: None  Code Status: Full code   Subjective: Patient complains of generalized abdominal pain  Objective: Noted mildly elevated blood pressures Vitals:   10/21/22 0437 10/21/22 0825  BP: (!) 141/60 (!) 140/51  Pulse: 75 74  Resp: 18 16  Temp: 98.1 F (36.7 C) 98.3 F (36.8 C)  SpO2: 93% 96%    Intake/Output Summary (Last 24 hours) at 10/21/2022 0925 Last data filed at 10/21/2022 8119 Gross per 24 hour  Intake 0 ml  Output --  Net 0 ml   Filed Weights   10/20/22 1657 10/21/22 0437  Weight: 46.3 kg 46.4 kg   Body mass index is 18.71 kg/m.  Exam:  General: Alert and oriented x 3, no acute distress HEENT: Normocephalic, atraumatic, mucous membranes slightly dry Cardiovascular: Regular rate and rhythm, S1-S2 Respiratory: Clear to auscultation bilaterally Abdomen: Soft, generalized tenderness, hypoactive bowel sounds, nondistended Musculoskeletal: No clubbing or cyanosis or edema Skin: No skin breaks, tears or lesions Psychiatry: Appropriate, no evidence of psychoses Neurology: No focal deficits  Data Reviewed: Hemoglobin this morning at 7.4 with normal MCV.  Stable electrolytes, creatinine of 4.9  Disposition:  Status is: Inpatient    Anticipated discharge date: 5/16  Remaining issues to  be resolved so that patient can be discharged:  -Dialysis tomorrow -Evaluation by gastroenterology with decision on if/when scope -Stabilization of hemoglobin -Improvement in enteritis/resolution of ileus  versus SBO   Family Communication: Will call family DVT Prophylaxis: SCDs Start: 10/20/22 2247    Author: Hollice Espy ,MD 10/21/2022 9:25 AM  To reach On-call, see care teams to locate the attending and reach out via [email or web address]. Between 7PM-7AM, please contact night-coverage If you still have difficulty reaching the attending provider, please page the [facility] (Director on Call) for [facility] on amion for assistance.

## 2022-10-21 NOTE — Consult Note (Addendum)
Consultation Note   Referring Provider:  [facility] PCP: Raymon Mutton., FNP Primary Gastroenterologist: Erick Blinks, MD        Reason for consultation: Heme + stool, anemia  DOA: 10/20/2022         Hospital Day: 2   Assessment and Plan   72 yo female with acute on chronic anemia ( hgb 9.2 >> 8) and reported several month history of black stool which is heme positive. Suspect GI bleeding secondary to know small bowel (duodenal) AVMs -Iron studies 09/06/22 not consistent with iron deficiency -Will most likely need another EGD or enteroscopy to evaluate / treat small bowel AVMs. The risks and benefits of EGD with possible biopsies were discussed with the patient who agrees to proceed.  -Consider colonoscopy at some point - sounds she has never had one -Continue BID IV PPI for now.   -Ok for clear liquids from GI standpoint  Chronic pancreatitis confirmed by EUS. She has chronic loose stool and significant weight loss, both possibly 2/2 to exocrine pancreatic insufficiency. Stool possibly also loose due to GI bleeding -trial of Creon with snacks and meals.   Diffuse gastric wall and small bowel wall thickening on CT scan of unclear etiology.   History of HCV, treated, Negative viral load Aug 2021  ESRD on HD MWF   History of Present Illness Patient is a 72 y.o. year old female whose past medical history includes but is not necessarily limited to chronic pancreatitis, small bowel ( duodenal) AVMs, duodenitis, remote perforated PUD, HTN, CVA, ESRD on HD  Patient presented to ED yesterday for evaluation of loose, dark stool. She can't really be specific about how long the loose dark stool has been present. Initially told me it started a few weeks ago then said a long time and then settled on it being present for several months. She reports a 30 pound weight loss over a year or so. She is sometimes nausea but main issue is the chronic  loose stool. No visible blood in stool, it is just usually dark. She doesn't take bismuth or iron. No NSAID use. She takes daily pantoprazole at home. She hasn't ever had a colonoscopy. Father may of had colon cancer in his 38's.   Significant studies:  WBC 3.2, Hgb 8 ( baseline 9.2).  CT AP with contrast -  IMPRESSION: 1. Gastroenteritis with mild diffuse dilatation of the small bowel but no visible transition. Findings could be due to ileus with enteritis and/or enteritis with a low-grade small-bowel obstruction. No free air or pneumatosis. **Also, body of report mentions  Moderate diffuse gastric thickening continues to be noted particularly in the antrum although it has been seen previously. 2. Aortic and coronary artery atherosclerosis. 3. Mild cardiomegaly. 4. Mild body wall anasarca. 5. Chronic mild prominence of the main pancreatic duct at 4 mm. 6. Atrophic kidneys with small simple cysts and tiny cortical hypodensities which are too small to characterize. No interval change. No follow-up imaging recommended.   Labs and Imaging: Recent Labs    10/20/22 1700 10/21/22 0100  WBC 3.2* 3.2*  HGB 8.0* 7.4*  HCT 25.0* 22.5*  PLT 204 188   Recent Labs    10/20/22 1700 10/21/22 0100  NA 138 136  K 3.9 3.6  CL 94* 95*  CO2 31 27  GLUCOSE 90 80  BUN 13 17  CREATININE 4.40* 4.92*  CALCIUM 8.6* 8.0*   Recent Labs    10/20/22 1700  PROT 6.5  ALBUMIN 3.4*  AST 18  ALT 11  ALKPHOS 37*  BILITOT 0.4     Previous GI Evaluation:   Dec 2014 EGD  March 2021 Epigastric pain  - Erosive gastropathy with no stigmata of recent bleeding. Biopsied. - Duodenitis. Biopsied. - A single non-bleeding angiodysplastic lesion in the duodenum. Treated with argon plasma coagulation (APC). Thought reasonable to ablate as at higher risk of bleeding given comorbidities - The examination was otherwise normal.  Biopsies - peptic duodenitis.   Aug 2021 EUS - Impressive parenchymal and  ductal changes that are consistent with chronic pancreatitis throughout the gland.  Sept 2022 EGD for melena / chronic anemia - Three small angioectasias with bleeding on contact were found in the duodenal bulb and in the second portion of the duodenum. I treated each with APC cautery and they all bled during treatment. One (in the second portion of the duodenum) bled briskly enough after APC application that I decided to place a single endoclip at the site with immediate hemostasis. - The examination was otherwise normal.   Principal Problem:   Gastroenteritis Active Problems:   Hypertension   ESRD on hemodialysis (HCC)   History of CVA (cerebrovascular accident)   Acute on chronic anemia   GIB (gastrointestinal bleeding)   Past Medical History:  Diagnosis Date   Acute pancreatitis 2000   2000, 12/2018, 08/2019   Arthritis    Cervical radiculopathy 02/28/2011   Cocaine substance abuse (HCC) 05/26/2013   positive UDS    Duodenitis    Erosive gastropathy    ESRD on hemodialysis (HCC)    TTS   GERD (gastroesophageal reflux disease)    Hepatitis C 1987   dt hx IVDA.  genotype 2B.  Epclusa started early 04/2020.     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  Chronic microvascular ischemic changes w parenchymal volume loss. Chronic white matter periventricular microhemorrhage, likely due to htn   Ulcer 1990   gastric ulcer. Ruptured s/p emergency repair    Past Surgical History:  Procedure Laterality Date   ABDOMINAL HYSTERECTOMY  1979   AV FISTULA PLACEMENT Left 06/16/2017   Procedure: ARTERIOVENOUS (AV) FISTULA CREATION LEFT ARM;  Surgeon: Fransisco Hertz, MD;  Location[facility] OR;  Service: Vascular;  Laterality: Left;   BASCILIC VEIN  TRANSPOSITION Left 10/02/2017   Procedure: BASILIC VEIN TRANSPOSITION SECOND STAGE LEFT ARM;  Surgeon: Larina Earthly, MD;  Location: [facility] OR;  Service: Vascular;  Laterality: Left;   BIOPSY  09/06/2019   Procedure: BIOPSY;  Surgeon: Iva Boop, MD;  Location: [facility] ENDOSCOPY;  Service: Endoscopy;;   ESOPHAGOGASTRODUODENOSCOPY N/A 05/29/2013   Procedure: ESOPHAGOGASTRODUODENOSCOPY (EGD);  Surgeon: Beverley Fiedler, MD;  Location: [facility] ENDOSCOPY;  Service: Endoscopy;  Laterality: N/A;   ESOPHAGOGASTRODUODENOSCOPY  05/2013   for epigastric pain.  Nonobstructing Schatzki ring at GEJ, mild gastropathy, nonbleeding AVMs in bulb and D2. 5 mm sessile polyp in bulb.   ESOPHAGOGASTRODUODENOSCOPY (EGD) WITH PROPOFOL N/A  09/06/2019   Procedure: ESOPHAGOGASTRODUODENOSCOPY (EGD) WITH PROPOFOL;  Surgeon: Iva Boop, MD;  Location: [facility] ENDOSCOPY;  Service: Endoscopy;  Laterality: N/A;   ESOPHAGOGASTRODUODENOSCOPY (EGD) WITH PROPOFOL N/A 02/02/2020   Procedure: ESOPHAGOGASTRODUODENOSCOPY (EGD) WITH PROPOFOL;  Surgeon: Rachael Fee, MD;  Location: [facility] ENDOSCOPY;  Service: Endoscopy;  Laterality: N/A;   ESOPHAGOGASTRODUODENOSCOPY (EGD) WITH PROPOFOL N/A 02/07/2021   Procedure: ESOPHAGOGASTRODUODENOSCOPY (EGD) WITH PROPOFOL;  Surgeon: Rachael Fee, MD;  Location: [facility] ENDOSCOPY;  Service: Endoscopy;  Laterality: N/A;   EUS N/A 02/02/2020   Procedure: UPPER ENDOSCOPIC ULTRASOUND (EUS) RADIAL;  Surgeon: Rachael Fee, MD;  Location: [facility] ENDOSCOPY;  Service: Endoscopy;  Laterality: N/A;   EXCHANGE OF A DIALYSIS CATHETER Left 07/31/2017   Procedure: Removal  OF A  Right GroinTUNNELED  DIALYSIS CATHETER ,  Insertion of Left Femoral Dialysis Catheter.;  Surgeon: Larina Earthly, MD;  Location: [facility] OR;  Service: Vascular;  Laterality: Left;   HEMOSTASIS CLIP PLACEMENT  02/07/2021   Procedure: HEMOSTASIS CLIP PLACEMENT;  Surgeon: Rachael Fee, MD;  Location: [facility] ENDOSCOPY;  Service: Endoscopy;;   HOT HEMOSTASIS N/A 09/06/2019    Procedure: HOT HEMOSTASIS (ARGON PLASMA COAGULATION/BICAP);  Surgeon: Iva Boop, MD;  Location: [facility] ENDOSCOPY;  Service: Endoscopy;  Laterality: N/A;   HOT HEMOSTASIS N/A 02/07/2021   Procedure: HOT HEMOSTASIS (ARGON PLASMA COAGULATION/BICAP);  Surgeon: Rachael Fee, MD;  Location: [facility] ENDOSCOPY;  Service: Endoscopy;  Laterality: N/A;   INSERTION OF DIALYSIS CATHETER Right 06/16/2017   Procedure: INSERTION OF DIALYSIS CATHETER;  Surgeon: Fransisco Hertz, MD;  Location: [facility] OR;  Service: Vascular;  Laterality: Right;   IR AV DIALY SHUNT INTRO NEEDLE/INTRACATH INITIAL W/PTA/IMG LEFT  06/21/2018   REPAIR OF PERFORATED ULCER  1990   gastric ulcer    Family History  Problem Relation Age of Onset   Hypertension Father    Cancer Father    Hyperlipidemia Father    Seizures Sister    Early death Daughter    Kidney disease Daughter        end stage dialysis dependent     Prior to Admission medications   Medication Sig Start Date End Date Taking? Authorizing Provider  amLODipine (NORVASC) 10 MG tablet Take 1 tablet (10 mg total) by mouth daily. Patient taking differently: Take 10 mg by mouth daily at 12 noon. 11/11/16   Pincus Large, DO  atorvastatin (LIPITOR) 20 MG tablet Take 20 mg by mouth at bedtime. 08/05/20   [provider]  B Complex-C-Zn-Folic Acid (DIALYVITE 800 WITH ZINC) 0.8 MG TABS Take 1 tablet by mouth daily.  07/07/19   [provider]  cinacalcet (SENSIPAR) 60 MG tablet Take 60 mg by mouth every evening. 03/23/21   [provider]  cloNIDine (CATAPRES) 0.1 MG tablet Take 0.1 mg by mouth daily. 06/25/19   [provider]  hydrALAZINE (APRESOLINE) 25 MG tablet Take 3 tablets (75 mg total) by mouth every 8 (eight) hours. 11/14/20 09/04/22  [place], Alvira Philips, DO  lidocaine-prilocaine (EMLA) cream Apply 1 application topically See admin instructions. Apply small amount to access site on Tuesday, Thursday, Saturday one hour before dialysis. Cover with  occlusive dressing (saran wrap) Patient not taking: Reported on 09/04/2022 08/25/19   [provider]  mirtazapine (REMERON) 7.5 MG tablet Take 7.5 mg by mouth at bedtime.    [provider]  ondansetron (ZOFRAN ODT) 4 MG disintegrating tablet Take 1 tablet (4 mg total) by mouth every 8 (eight) hours as needed  for nausea or vomiting. 04/10/21   Pricilla Loveless, MD  pantoprazole (PROTONIX) 40 MG tablet Take 1 tablet (40 mg total) by mouth daily. Patient not taking: Reported on 09/04/2022 11/14/20 04/13/21  [place], Alvira Philips, DO  senna-docusate (SENOKOT-S) 8.6-50 MG tablet Take 1 tablet by mouth at bedtime as needed for mild constipation. 04/16/21   Regalado, Belkys A, MD  sevelamer carbonate (RENVELA) 800 MG tablet Take 800 mg by mouth 3 (three) times daily with meals. 07/23/20   [provider]    Current Facility-Administered Medications  Medication Dose Route Frequency Provider Last Rate Last Admin   acetaminophen (TYLENOL) tablet 650 mg  650 mg Oral Q6H PRN Charlsie Quest, MD       Or   acetaminophen (TYLENOL) suppository 650 mg  650 mg Rectal Q6H PRN Darreld Mclean R, MD       amLODipine (NORVASC) tablet 10 mg  10 mg Oral Q1200 Darreld Mclean R, MD       atorvastatin (LIPITOR) tablet 20 mg  20 mg Oral QHS Patel, Vishal R, MD       hydrALAZINE (APRESOLINE) tablet 75 mg  75 mg Oral Q8H Patel, Vishal R, MD   75 mg at 10/21/22 0653   morphine (PF) 2 MG/ML injection 1 mg  1 mg Intravenous Q3H PRN Hollice Espy, MD   1 mg at 10/21/22 1004   ondansetron (ZOFRAN) tablet 4 mg  4 mg Oral Q6H PRN Charlsie Quest, MD       Or   ondansetron (ZOFRAN) injection 4 mg  4 mg Intravenous Q6H PRN Charlsie Quest, MD       pantoprazole (PROTONIX) injection 40 mg  40 mg Intravenous Q12H Darreld Mclean R, MD   40 mg at 10/21/22 1003   senna-docusate (Senokot-S) tablet 1 tablet  1 tablet Oral QHS PRN Charlsie Quest, MD        Allergies as of 10/20/2022 - Review Complete 10/20/2022   Allergen Reaction Noted   Aspirin Nausea And Vomiting 07/18/2009   Ibuprofen Nausea And Vomiting 12/24/2009    Social History   Socioeconomic History   Marital status: Widowed    Spouse name: Not on file   Number of children: 3   Years of education: Not on file   Highest education level: Not on file  Occupational History   Occupation: retired  Tobacco Use   Smoking status: Every Day    Packs/day: 0.25    Years: 40.00    Additional pack years: 0.00    Total pack years: 10.00    Types: Cigarettes   Smokeless tobacco: Never  Vaping Use   Vaping Use: Never used  Substance and Sexual Activity   Alcohol use: No    Alcohol/week: 0.0 standard drinks of alcohol   Drug use: Not Currently    Types: Heroin, Marijuana, Cocaine    Comment: hasn't used cocaine in 1-2 years; she smokes marijuana daily, "whenever I can get it"   Sexual activity: Never  Other Topics Concern   Not on file  Social History Narrative   Not on file   Social Determinants of Health   Financial Resource Strain: Not on file  Food Insecurity: No Food Insecurity (10/20/2022)   Hunger Vital Sign    Worried About Running Out of Food in the Last Year: Never true    Ran Out of Food in the Last Year: Never true  Transportation Needs: No Transportation Needs (10/20/2022)   PRAPARE - Transportation  Lack of Transportation (Medical): No    Lack of Transportation (Non-Medical): No  Physical Activity: Not on file  Stress: Not on file  Social Connections: Not on file  Intimate Partner Violence: Not At Risk (10/20/2022)   Humiliation, Afraid, Rape, and Kick questionnaire    Fear of Current or Ex-Partner: No    Emotionally Abused: No    Physically Abused: No    Sexually Abused: No     Code Status   Code Status: Full Code  Review of Systems: All systems reviewed and negative except where noted in HPI.  Physical Exam: Vital signs in last 24 hours: Temp:  [98.1 F (36.7 C)-98.8 F (37.1 C)] 98.3 F (36.8  C) (05/14 0825) Pulse Rate:  [73-79] 74 (05/14 0825) Resp:  [16-18] 16 (05/14 0825) BP: (135-156)/(51-77) 140/51 (05/14 0825) SpO2:  [90 %-100 %] 96 % (05/14 0825) Weight:  [46.3 kg-46.4 kg] 46.4 kg (05/14 0437) Last BM Date : 10/20/22  General:  Slightly agitated thin female in NAD Psych:  Cooperative. Normal mood and affect Eyes: Pupils equal Ears:  Normal auditory acuity Nose: No deformity, discharge or lesions Neck:  Supple, no masses felt Lungs:  Clear to auscultation.  Heart:  Regular rate, regular rhythm. Loud murmur present Abdomen:  Soft, nondistended, nontender, active bowel sounds, no masses felt Rectal :  Deferred Msk: Symmetrical without gross deformities.  Neurologic:  Alert, oriented, grossly normal neurologically Extremities : No edema Skin:  Intact without significant lesions.    Intake/Output from previous day: No intake/output data recorded. Intake/Output this shift:  No intake/output data recorded.     Willette Cluster, NP-C @  10/21/2022, 11:15 AM  I have taken an interval history, thoroughly reviewed the chart and examined the patient. I agree with the Advanced Practitioner's note, impression and recommendations, and have recorded additional findings, impressions and recommendations below. I performed a substantive portion of this encounter (>50% time spent), including a complete performance of the medical decision making.  My additional thoughts are as follows:  72 year old woman with end-stage renal disease on dialysis and history of small bowel AVMs, here with acute on chronic anemia and heme positive stool.  Suspect recurrent small bowel or gastric AVMs.  Chronic diarrhea and weight loss, suspect exocrine pancreatic insufficiency.  Will start pancreatic enzymes at the time of discharge  Abnormal imaging GI, questionable focal small bowel wall thickening and some gastric wall thickening.   Plans for small bowel enteroscopy, perhaps tomorrow if can  be coordinated between endoscopy and the patient's dialysis, if not then the day following.  Discussed that with her in detail along with risk and benefits and she was agreeable.  The benefits and risks of the planned procedure were described in detail with the patient or (when appropriate) their health care proxy.  Risks were outlined as including, but not limited to, bleeding, infection, perforation, adverse medication reaction leading to cardiac or pulmonary decompensation, pancreatitis (if ERCP).  The limitation of incomplete mucosal visualization was also discussed.  No guarantees or warranties were given.  [name] [phone]

## 2022-10-21 NOTE — Progress Notes (Signed)
Initial Nutrition Assessment  DOCUMENTATION CODES:   Underweight  INTERVENTION:  Once diet is advanced, Renal Multivitamin w/ minerals daily Recommend regular once medically appropriate Once diet is advanced recommend Ensure Enlive po BID, each supplement provides 350 kcal and 20 grams of protein.  NUTRITION DIAGNOSIS:   Inadequate oral intake related to inability to eat as evidenced by NPO status.  GOAL:   Patient will meet greater than or equal to 90% of their needs  MONITOR:   Diet advancement, Labs, I & O's, Weight trends  REASON FOR ASSESSMENT:   Consult Assessment of nutrition requirement/status  ASSESSMENT:   72 y.o. female presented to the ED with abdominal pain and dark stools. PMH includes CVA, HLD, ESRD On HD (MWF), HTN, pancreatitis, and peptic ulcer disease. Pt admitted with gastroenteritis and acute on chronic anemia.   Pt out of room today, in HD and Endo. Pt remains NPO for endoscopy by GI. Pt currently 1 kg under EDW. No weights from the past year in EMR to assess weight loss. Per notes, reports of chronic diarrhea and significant weight loss.   Medications reviewed and include: Hectorol MWF, Protonix Labs reviewed: Sodium 135, Potassium 3.9, Phosphorus 3.5 (10/21/22)   HD on 5/15 EDW: 46.8 kg Net UF: 1000 mL  NUTRITION - FOCUSED PHYSICAL EXAM:  Deferred to follow-up.   Diet Order:   Diet Order             Diet NPO time specified Except for: Sips with Meds  Diet effective midnight                   EDUCATION NEEDS:   Not appropriate for education at this time  Skin:  Skin Assessment: Reviewed RN Assessment  Last BM:  5/13  Height:   Ht Readings from Last 1 Encounters:  10/22/22 5\' 2"  (1.575 m)    Weight:   Wt Readings from Last 1 Encounters:  10/22/22 45.8 kg    Ideal Body Weight:  50 kg  BMI:  Body mass index is 18.47 kg/m.  Estimated Nutritional Needs:  Kcal:  1500-1700 Protein:  75-95 grams Fluid:  1 L +  UOP   Kirby Crigler RD, LDN Clinical Dietitian See [facility] for contact information.

## 2022-10-21 NOTE — Consult Note (Signed)
Renal Service Consult Note [facility]  DAMARYS LOCKREM 10/21/2022 [name], MD Requesting Physician: Dr. Chancy Milroy  Reason for Consult: ESRD pt w/ blood in stools, abd pain and diarrhea when she eats HPI: The patient is a 72 y.o. year-old w/ PMH as below who presented w/ above symptoms to ED. For 2 weeks has been having abd pain and diarrhea after meals, some dark stools as well. Not on asa or nsaids or blood thinners. In ED VSS, 98% on RA, creat 4.4, FOBT was positive.  CT abd showed gastroenteritis w/ diffuse dilatation of the SB w/o clear transition. Also could be ileus+ enteritis , or enteritis + SBO. Pt was given IV PPI, morphine, dilaudid and then admitted. We are asked to see for dialysis.   Pt seen in room. Takes SCAT to OP HD, lives with her daughter. Has L arm AVF.  No recent HD issues.     ROS - denies CP, no joint pain, no HA, no blurry vision, no rash, no dysuria, no difficulty voiding   Past Medical History  Past Medical History:  Diagnosis Date   Acute pancreatitis 2000   2000, 12/2018, 08/2019   Arthritis    Cervical radiculopathy 02/28/2011   Cocaine substance abuse (HCC) 05/26/2013   positive UDS    Duodenitis    Erosive gastropathy    ESRD on hemodialysis (HCC)    TTS   GERD (gastroesophageal reflux disease)    Hepatitis C 1987   dt hx IVDA.  genotype 2B.  Epclusa started early 04/2020.     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  Chronic microvascular ischemic changes w parenchymal volume loss. Chronic white matter periventricular microhemorrhage, likely due to htn   Ulcer 1990   gastric ulcer. Ruptured s/p emergency repair   Past Surgical History  Past Surgical History:  Procedure  Laterality Date   ABDOMINAL HYSTERECTOMY  1979   AV FISTULA PLACEMENT Left 06/16/2017   Procedure: ARTERIOVENOUS (AV) FISTULA CREATION LEFT ARM;  Surgeon: Fransisco Hertz, MD;  Location: [facility] OR;  Service: Vascular;  Laterality: Left;   BASCILIC VEIN TRANSPOSITION Left 10/02/2017   Procedure: BASILIC VEIN TRANSPOSITION SECOND STAGE LEFT ARM;  Surgeon: Larina Earthly, MD;  Location: [facility] OR;  Service: Vascular;  Laterality: Left;   BIOPSY  09/06/2019   Procedure: BIOPSY;  Surgeon: Iva Boop, MD;  Location: [facility] ENDOSCOPY;  Service: Endoscopy;;   ESOPHAGOGASTRODUODENOSCOPY N/A 05/29/2013   Procedure: ESOPHAGOGASTRODUODENOSCOPY (EGD);  Surgeon: Beverley Fiedler, MD;  Location: [facility] ENDOSCOPY;  Service: Endoscopy;  Laterality: N/A;   ESOPHAGOGASTRODUODENOSCOPY  05/2013   for epigastric pain.  Nonobstructing Schatzki ring at GEJ, mild gastropathy, nonbleeding AVMs in bulb and D2. 5 mm sessile polyp in bulb.   ESOPHAGOGASTRODUODENOSCOPY (EGD) WITH PROPOFOL N/A 09/06/2019   Procedure: ESOPHAGOGASTRODUODENOSCOPY (EGD) WITH PROPOFOL;  Surgeon: Iva Boop, MD;  Location: [facility] ENDOSCOPY;  Service: Endoscopy;  Laterality: N/A;   ESOPHAGOGASTRODUODENOSCOPY (EGD) WITH PROPOFOL N/A 02/02/2020   Procedure: ESOPHAGOGASTRODUODENOSCOPY (EGD) WITH PROPOFOL;  Surgeon: Rachael Fee, MD;  Location: [facility] ENDOSCOPY;  Service: Endoscopy;  Laterality: N/A;   ESOPHAGOGASTRODUODENOSCOPY (EGD) WITH PROPOFOL N/A 02/07/2021   Procedure: ESOPHAGOGASTRODUODENOSCOPY (EGD) WITH PROPOFOL;  Surgeon: Christella Hartigan,  Melton Alar, MD;  Location: [facility] ENDOSCOPY;  Service: Endoscopy;  Laterality: N/A;   EUS N/A 02/02/2020   Procedure: UPPER ENDOSCOPIC ULTRASOUND (EUS) RADIAL;  Surgeon: Rachael Fee, MD;  Location: [facility] ENDOSCOPY;  Service: Endoscopy;  Laterality: N/A;   EXCHANGE OF A DIALYSIS CATHETER Left 07/31/2017   Procedure: Removal  OF A  Right GroinTUNNELED  DIALYSIS CATHETER ,  Insertion of Left Femoral Dialysis Catheter.;  Surgeon: Larina Earthly, MD;   Location: [facility] OR;  Service: Vascular;  Laterality: Left;   HEMOSTASIS CLIP PLACEMENT  02/07/2021   Procedure: HEMOSTASIS CLIP PLACEMENT;  Surgeon: Rachael Fee, MD;  Location: [facility] ENDOSCOPY;  Service: Endoscopy;;   HOT HEMOSTASIS N/A 09/06/2019   Procedure: HOT HEMOSTASIS (ARGON PLASMA COAGULATION/BICAP);  Surgeon: Iva Boop, MD;  Location: [facility] ENDOSCOPY;  Service: Endoscopy;  Laterality: N/A;   HOT HEMOSTASIS N/A 02/07/2021   Procedure: HOT HEMOSTASIS (ARGON PLASMA COAGULATION/BICAP);  Surgeon: Rachael Fee, MD;  Location: [facility] ENDOSCOPY;  Service: Endoscopy;  Laterality: N/A;   INSERTION OF DIALYSIS CATHETER Right 06/16/2017   Procedure: INSERTION OF DIALYSIS CATHETER;  Surgeon: Fransisco Hertz, MD;  Location: [facility] OR;  Service: Vascular;  Laterality: Right;   IR AV DIALY SHUNT INTRO NEEDLE/INTRACATH INITIAL W/PTA/IMG LEFT  06/21/2018   REPAIR OF PERFORATED ULCER  1990   gastric ulcer   Family History  Family History  Problem Relation Age of Onset   Hypertension Father    Cancer Father    Hyperlipidemia Father    Seizures Sister    Early death Daughter    Kidney disease Daughter        end stage dialysis dependent    Social History  reports that she has been smoking cigarettes. She has a 10.00 pack-year smoking history. She has never used smokeless tobacco. She reports that she does not currently use drugs after having used the following drugs: Heroin, Marijuana, and Cocaine. She reports that she does not drink alcohol. Allergies  Allergies  Allergen Reactions   Aspirin Nausea And Vomiting    Stomach ache   Ibuprofen Nausea And Vomiting    Stomach ache   Home medications Prior to Admission medications   Medication Sig Start Date End Date Taking? Authorizing Provider  amLODipine (NORVASC) 10 MG tablet Take 1 tablet (10 mg total) by mouth daily. Patient taking differently: Take 10 mg by mouth daily at 12 noon. 11/11/16   Pincus Large, DO  atorvastatin (LIPITOR) 20 MG tablet Take  20 mg by mouth at bedtime. 08/05/20   [provider]  B Complex-C-Zn-Folic Acid (DIALYVITE 800 WITH ZINC) 0.8 MG TABS Take 1 tablet by mouth daily.  07/07/19   [provider]  cinacalcet (SENSIPAR) 60 MG tablet Take 60 mg by mouth every evening. 03/23/21   [provider]  cloNIDine (CATAPRES) 0.1 MG tablet Take 0.1 mg by mouth daily. 06/25/19   [provider]  hydrALAZINE (APRESOLINE) 25 MG tablet Take 3 tablets (75 mg total) by mouth every 8 (eight) hours. 11/14/20 09/04/22  [place], Alvira Philips, DO  lidocaine-prilocaine (EMLA) cream Apply 1 application topically See admin instructions. Apply small amount to access site on Tuesday, Thursday, Saturday one hour before dialysis. Cover with occlusive dressing (saran wrap) Patient not taking: Reported on 09/04/2022 08/25/19   [provider]  mirtazapine (REMERON) 7.5 MG tablet Take 7.5 mg by mouth at bedtime.    [provider]  ondansetron (ZOFRAN ODT) 4 MG disintegrating tablet Take 1 tablet (4  mg total) by mouth every 8 (eight) hours as needed for nausea or vomiting. 04/10/21   Pricilla Loveless, MD  pantoprazole (PROTONIX) 40 MG tablet Take 1 tablet (40 mg total) by mouth daily. Patient not taking: Reported on 09/04/2022 11/14/20 04/13/21  [place], Alvira Philips, DO  senna-docusate (SENOKOT-S) 8.6-50 MG tablet Take 1 tablet by mouth at bedtime as needed for mild constipation. 04/16/21   Regalado, Belkys A, MD  sevelamer carbonate (RENVELA) 800 MG tablet Take 800 mg by mouth 3 (three) times daily with meals. 07/23/20   [provider]     Vitals:   10/21/22 0044 10/21/22 0437 10/21/22 0825 10/21/22 1139  BP: (!) 137/59 (!) 141/60 (!) 140/51 (!) 147/55  Pulse: 73 75 74 77  Resp: 18 18 16 17   Temp: 98.5 F (36.9 C) 98.1 F (36.7 C) 98.3 F (36.8 C) 98.4 F (36.9 C)  TempSrc: Oral Oral Oral Oral  SpO2: 99% 93% 96% 97%  Weight:  46.4 kg    Height:       Exam Gen alert, no distress No rash, cyanosis  or gangrene Sclera anicteric, throat clear  No jvd or bruits Chest clear bilat to bases, no rales/ wheezing RRR no MRG Abd soft ntnd no mass or ascites +bs GU defer MS no joint effusions or deformity Ext no LE or UE edema, no wounds or ulcers Neuro is alert, Ox 3 , nf    LUA AVF+bruit      Home meds include - sensipar 60 hs, catapres 0.1 qd, remeron, protonix, renvela 800 tid, norvasc 10, lipitor, hydralazine 75 tid, prns     OP HD: [facility] MWF  3.5h   350/500  46.8kg  2/2.5 bath  LUA AVF   Heparin none - last HD 5/13, post wt 46.9kg   - hectorol 3 mcg IV tiw - venofer 50mg  IV weekly - mircera 150 mcg IV q 2 wks, last 5/10, due 5/24   Assessment/ Plan: Abd pain/ diarrhea - suspected gastroenteritis w/ CT findings, vs low-grade SBO. Keeping npo for now. Per pmd.  Acute / chronic anemia - Hb 8 here, usual is 9 -10. Not on any blood thinners, hx of perf GU in the past. 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 Per pmd/ GI.  ESRD - on HD MWF. Had full HD yesterday. HD tomorrow.  HTN/ volume - BP's are wnl, no vol overload on exam. At dry wt. Small UF next HD. Cont BP meds are you are doing.  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 Just had esa on 5/10, follow.  MBD ckd - CCa in range, will add on phos. Resume binders when eating. Cont IV vdra w/ HD.  H/o CVA    Vinson Moselle  MD [facility] 10/21/2022, 2:45 PM  Recent Labs  Lab 10/20/22 1700 10/21/22 0100  HGB 8.0* 7.4*  ALBUMIN 3.4*  --   CALCIUM 8.6* 8.0*  CREATININE 4.40* 4.92*  K 3.9 3.6   Inpatient medications:  amLODipine  10 mg Oral Q1200   atorvastatin  20 mg Oral QHS   hydrALAZINE  75 mg Oral Q8H   pantoprazole (PROTONIX) IV  40 mg Intravenous Q12H    acetaminophen **OR** acetaminophen, morphine injection, ondansetron **OR** ondansetron (ZOFRAN) IV, senna-docusate

## 2022-10-22 ENCOUNTER — Encounter (HOSPITAL_COMMUNITY): Payer: Self-pay | Admitting: Internal Medicine

## 2022-10-22 ENCOUNTER — Encounter (HOSPITAL_COMMUNITY)
Admission: EM | Disposition: A | Discharge status: Home / Self Care | Payer: Self-pay | Source: Home / Self Care | Attending: Internal Medicine

## 2022-10-22 ENCOUNTER — Inpatient Hospital Stay (HOSPITAL_COMMUNITY): Payer: 59 | Admitting: Anesthesiology

## 2022-10-22 DIAGNOSIS — D500 Iron deficiency anemia secondary to blood loss (chronic): Secondary | ICD-10-CM

## 2022-10-22 DIAGNOSIS — F17210 Nicotine dependence, cigarettes, uncomplicated: Secondary | ICD-10-CM

## 2022-10-22 DIAGNOSIS — K31819 Angiodysplasia of stomach and duodenum without bleeding: Secondary | ICD-10-CM

## 2022-10-22 DIAGNOSIS — K529 Noninfective gastroenteritis and colitis, unspecified: Secondary | ICD-10-CM | POA: Diagnosis not present

## 2022-10-22 DIAGNOSIS — I10 Essential (primary) hypertension: Secondary | ICD-10-CM

## 2022-10-22 HISTORY — PX: SCLEROTHERAPY: SHX6841

## 2022-10-22 HISTORY — PX: HOT HEMOSTASIS: SHX5433

## 2022-10-22 HISTORY — PX: SUBMUCOSAL TATTOO INJECTION: SHX6856

## 2022-10-22 HISTORY — PX: HEMOSTASIS CLIP PLACEMENT: SHX6857

## 2022-10-22 HISTORY — PX: ENTEROSCOPY: SHX5533

## 2022-10-22 LAB — CBC
HCT: 22.6 % — ABNORMAL LOW (ref 36.0–46.0)
Hemoglobin: 7.5 g/dL — ABNORMAL LOW (ref 12.0–15.0)
MCH: 29.9 pg (ref 26.0–34.0)
MCHC: 33.2 g/dL (ref 30.0–36.0)
MCV: 90 fL (ref 80.0–100.0)
Platelets: 257 10*3/uL (ref 150–400)
RBC: 2.51 MIL/uL — ABNORMAL LOW (ref 3.87–5.11)
RDW: 19.9 % — ABNORMAL HIGH (ref 11.5–15.5)
WBC: 3.3 10*3/uL — ABNORMAL LOW (ref 4.0–10.5)
nRBC: 0 % (ref 0.0–0.2)

## 2022-10-22 LAB — BASIC METABOLIC PANEL
Anion gap: 12 (ref 5–15)
BUN: 25 mg/dL — ABNORMAL HIGH (ref 8–23)
CO2: 29 mmol/L (ref 22–32)
Calcium: 8.2 mg/dL — ABNORMAL LOW (ref 8.9–10.3)
Chloride: 94 mmol/L — ABNORMAL LOW (ref 98–111)
Creatinine, Ser: 7.93 mg/dL — ABNORMAL HIGH (ref 0.44–1.00)
GFR, Estimated: 5 mL/min — ABNORMAL LOW (ref >60)
Glucose, Bld: 70 mg/dL (ref 70–99)
Potassium: 3.9 mmol/L (ref 3.5–5.1)
Sodium: 135 mmol/L (ref 135–145)

## 2022-10-22 LAB — HEPATITIS B SURFACE ANTIBODY, QUANTITATIVE: Hep B S AB Quant (Post): 61 m[IU]/mL (ref >9.9)

## 2022-10-22 SURGERY — ENTEROSCOPY
Anesthesia: Monitor Anesthesia Care

## 2022-10-22 MED ORDER — SPOT INK MARKER SYRINGE KIT
PACK | SUBMUCOSAL | Status: AC
  Filled 2022-10-22: qty 5

## 2022-10-22 MED ORDER — PANTOPRAZOLE SODIUM 40 MG PO TBEC
40.0000 mg | DELAYED_RELEASE_TABLET | Freq: Every day | ORAL | Status: DC
  Administered 2022-10-22 – 2022-10-23 (×2): 40 mg via ORAL
  Filled 2022-10-22 (×2): qty 1

## 2022-10-22 MED ORDER — DICYCLOMINE HCL 10 MG PO CAPS
10.0000 mg | ORAL_CAPSULE | Freq: Three times a day (TID) | ORAL | Status: DC
  Administered 2022-10-22 – 2022-10-23 (×2): 10 mg via ORAL
  Filled 2022-10-22 (×6): qty 1

## 2022-10-22 MED ORDER — MIRTAZAPINE 15 MG PO TABS
7.5000 mg | ORAL_TABLET | Freq: Every day | ORAL | Status: DC
  Administered 2022-10-22: 7.5 mg via ORAL
  Filled 2022-10-22: qty 1

## 2022-10-22 MED ORDER — SPOT INK MARKER SYRINGE KIT
PACK | SUBMUCOSAL | Status: DC | PRN
  Administered 2022-10-22: 1 mL via SUBMUCOSAL

## 2022-10-22 MED ORDER — CINACALCET HCL 30 MG PO TABS
60.0000 mg | ORAL_TABLET | Freq: Every evening | ORAL | Status: DC
  Administered 2022-10-22: 60 mg via ORAL
  Filled 2022-10-22 (×3): qty 2

## 2022-10-22 MED ORDER — SODIUM CHLORIDE (PF) 0.9 % IJ SOLN
PREFILLED_SYRINGE | INTRAMUSCULAR | Status: DC | PRN
  Administered 2022-10-22: 1 mL

## 2022-10-22 MED ORDER — EPINEPHRINE 1 MG/10ML IJ SOSY
PREFILLED_SYRINGE | INTRAMUSCULAR | Status: AC
  Filled 2022-10-22: qty 10

## 2022-10-22 MED ORDER — PROPOFOL 10 MG/ML IV BOLUS
INTRAVENOUS | Status: DC | PRN
  Administered 2022-10-22 (×2): 20 mg via INTRAVENOUS

## 2022-10-22 MED ORDER — SEVELAMER CARBONATE 800 MG PO TABS
800.0000 mg | ORAL_TABLET | Freq: Three times a day (TID) | ORAL | Status: DC
  Administered 2022-10-22 – 2022-10-23 (×3): 800 mg via ORAL
  Filled 2022-10-22 (×3): qty 1

## 2022-10-22 MED ORDER — HYDROCODONE-ACETAMINOPHEN 5-325 MG PO TABS
1.0000 | ORAL_TABLET | Freq: Four times a day (QID) | ORAL | Status: DC | PRN
  Administered 2022-10-22 – 2022-10-23 (×2): 1 via ORAL
  Filled 2022-10-22 (×2): qty 1

## 2022-10-22 MED ORDER — PANCRELIPASE (LIP-PROT-AMYL) 36000-114000 UNITS PO CPEP
36000.0000 [IU] | ORAL_CAPSULE | Freq: Three times a day (TID) | ORAL | Status: DC
  Administered 2022-10-22 – 2022-10-23 (×3): 36000 [IU] via ORAL
  Filled 2022-10-22 (×3): qty 1

## 2022-10-22 MED ORDER — SODIUM CHLORIDE 0.9 % IV SOLN: INTRAVENOUS | Status: DC

## 2022-10-22 MED ORDER — HYDROMORPHONE HCL 1 MG/ML IJ SOLN
0.5000 mg | Freq: Once | INTRAMUSCULAR | Status: AC | PRN
  Administered 2022-10-22: 0.5 mg via INTRAVENOUS
  Filled 2022-10-22: qty 0.5

## 2022-10-22 MED ORDER — PROPOFOL 500 MG/50ML IV EMUL
INTRAVENOUS | Status: DC | PRN
  Administered 2022-10-22: 125 ug/kg/min via INTRAVENOUS

## 2022-10-22 NOTE — Interval H&P Note (Signed)
History and Physical Interval Note:  10/22/2022 1:39 PM  Ashley [name]  has presented today for surgery, with the diagnosis of anemia, hemoccult positive stool.  The various methods of treatment have been discussed with the patient and family. After consideration of risks, benefits and other options for treatment, the patient has consented to  Procedure(s): ENTEROSCOPY (N/A) as a surgical intervention.  The patient's history has been reviewed, patient examined, no change in status, stable for surgery.  I have reviewed the patient's chart and labs.  Questions were answered to the patient's satisfaction.    Patient had hemodialysis this morning and her Hgb was 7.5 [name]

## 2022-10-22 NOTE — Anesthesia Postprocedure Evaluation (Signed)
Anesthesia Post Note  Patient: Ashley [name]  Procedure(s) Performed: ENTEROSCOPY SUBMUCOSAL TATTOO INJECTION HOT HEMOSTASIS (ARGON PLASMA COAGULATION/BICAP) SCLEROTHERAPY HEMOSTASIS CLIP PLACEMENT     Patient location during evaluation: PACU Anesthesia Type: MAC Level of consciousness: awake and alert Pain management: pain level controlled Vital Signs Assessment: post-procedure vital signs reviewed and stable Respiratory status: spontaneous breathing, nonlabored ventilation, respiratory function stable and patient connected to nasal cannula oxygen Cardiovascular status: stable and blood pressure returned to baseline Postop Assessment: no apparent nausea or vomiting Anesthetic complications: no   No notable events documented.  Last Vitals:  Vitals:   10/22/22 1508 10/22/22 1518  BP: (!) 167/64 (!) 167/64  Pulse: 86 84  Resp: 18 19  Temp:    SpO2: 97% 98%    Last Pain:  Vitals:   10/22/22 1508  TempSrc:   PainSc: 6                  Jermaine Tholl

## 2022-10-22 NOTE — Op Note (Addendum)
[facility] Patient Name: Ashley [name] Procedure Date : 10/22/2022 MRN: [id number] Attending MD: Starr Lake. Myrtie Neither , MD, [id number] Date of Birth: Nov 12, 1950 CSN: [id number] Age: 72 Admit Type: Inpatient Procedure:                Small bowel enteroscopy Indications:              Iron deficiency anemia secondary to chronic blood                            loss, Gastrointestinal occult blood loss, Suspected                            angioectasia Providers:                Sherilyn Cooter L. Myrtie Neither, MD, Jasmine Pang, RN, Beryle Beams, Technician Referring MD:              Medicines:                Monitored Anesthesia Care Complications:            No immediate complications. Estimated Blood Loss:     Estimated blood loss was minimal. Procedure:                Pre-Anesthesia Assessment:                           - Prior to the procedure, a History and Physical                            was performed, and patient medications and                            allergies were reviewed. The patient's tolerance of                            previous anesthesia was also reviewed. The risks                            and benefits of the procedure and the sedation                            options and risks were discussed with the patient.                            All questions were answered, and informed consent                            was obtained. Prior Anticoagulants: The patient has                            taken no anticoagulant or antiplatelet agents. ASA  Grade Assessment: IV - A patient with severe                            systemic disease that is a constant threat to life.                            After reviewing the risks and benefits, the patient                            was deemed in satisfactory condition to undergo the                            procedure.                           After obtaining informed  consent, the endoscope was                            passed under direct vision. Throughout the                            procedure, the patient's blood pressure, pulse, and                            oxygen saturations were monitored continuously. The                            PCF-190TL ([id number] Olympus colonoscope was                            introduced through the mouth and advanced to the                            proximal jejunum (140 cm of reduced scope). The                            small bowel enteroscopy was accomplished without                            difficulty. The patient tolerated the procedure. Scope In: Scope Out: Findings:      The examined esophagus was normal. (Normal on scope insertion. There was       mucosal scope trauma in the proximal esophagus noted on scope withdrawal)      The stomach was normal.      There was no evidence of significant pathology in the entire examined       portion of jejunum. Area of maximal scope insertion was tattooed with an       injection of 0.5 mL of Spot (carbon black).      A single angioectasia with no bleeding was found in the duodenal bulb.       Coagulation for hemostasis using argon plasma at 0.5 liters/minute and       20 watts was successful. This ablated the angioectasia, and then some       oozing occurred. Area was successfully  injected with 1 mL of a 0.01       mg/mL solution of epinephrine for hemostasis. To prevent bleeding       post-intervention, one hemostatic clip was successfully placed (MR       conditional). There was no bleeding at the end of the procedure. Impression:               - Normal esophagus.                           - Normal stomach.                           - The examined portion of the jejunum was normal.                            Tattooed.                           - A single non-bleeding angioectasia in the                            duodenum. Treated with argon plasma coagulation                             (APC). Injected. Clip (MR conditional) was placed.                           - No specimens collected. Recommendation:           - Resume previous diet.                           - Return patient to hospital ward for ongoing care.                           - Close attention to hemoglobin and iron level                            monitoring as an outpatient. Patient may need                            periodic IV iron at dialysis.                           Once daily oral PPI for 4 weeks                           This patient can be discharged home anytime from a                            GI perspective.                           Inpatient GI service signing off, remaining  available if called. Procedure Code(s):        --- Professional ---                           [phone], Small intestinal endoscopy, enteroscopy                            beyond second portion of duodenum, not including                            ileum; with control of bleeding (eg, injection,                            bipolar cautery, unipolar cautery, laser, heater                            probe, stapler, plasma coagulator)                           44799, Unlisted procedure, small intestine Diagnosis Code(s):        --- Professional ---                           K31.819, Angiodysplasia of stomach and duodenum                            without bleeding                           D50.0, Iron deficiency anemia secondary to blood                            loss (chronic)                           R19.5, Other fecal abnormalities CPT copyright 2022 American Medical Association. All rights reserved. The codes documented in this report are preliminary and upon coder review may  be revised to meet current compliance requirements. Deontez Klinke L. Myrtie Neither, MD 10/22/2022 2:59:18 PM This report has been signed electronically. Number of Addenda: 0

## 2022-10-22 NOTE — Anesthesia Preprocedure Evaluation (Addendum)
Anesthesia Evaluation  Patient identified by MRN, date of birth, ID band Patient awake    Reviewed: Allergy & Precautions, NPO status , Patient's Chart, lab work & pertinent test results  History of Anesthesia Complications (+) MALIGNANT HYPERTHERMIA and history of anesthetic complications  Airway Mallampati: II  TM Distance: >3 FB Neck ROM: Full    Dental no notable dental hx. (+) Edentulous Upper, Poor Dentition, Missing, Dental Advisory Given,    Pulmonary Current Smoker and Patient abstained from smoking. + COVID   Pulmonary exam normal breath sounds clear to auscultation       Cardiovascular hypertension, Pt. on medications Normal cardiovascular exam Rhythm:Regular Rate:Normal     1. Anterior and anterolateral hypokinesis. Left ventricular ejection  fraction, by estimation, is 40 to 45%. The left ventricle has mildly  decreased function. The left ventricle demonstrates regional wall motion  abnormalities (see scoring  diagram/findings for description). There is moderate concentric left  ventricular hypertrophy. Left ventricular diastolic parameters are  consistent with Grade I diastolic dysfunction (impaired relaxation).   2. Right ventricular systolic function is normal. The right ventricular  size is normal. There is mildly elevated pulmonary artery systolic  pressure.   3. Left atrial size was severely dilated.   4. Right atrial size was severely dilated.   5. The mitral valve is normal in structure. Mild mitral valve  regurgitation. No evidence of mitral stenosis.   6. The aortic valve is tricuspid. Aortic valve regurgitation is not  visualized. No aortic stenosis is present.   7. The inferior vena cava is normal in size with greater than 50%  respiratory variability, suggesting right atrial pressure of 3 mmHg.    Neuro/Psych Cervical radiculopathy  Neuromuscular disease CVA, Residual Symptoms  negative psych ROS    GI/Hepatic hiatal hernia, PUD,GERD  ,,(+)     substance abuse  cocaine use and marijuana use, Hepatitis -, Cpancreatitis   Endo/Other  hyperlipidemia  Renal/GU DialysisRenal disease  negative genitourinary   Musculoskeletal  (+) Arthritis ,    Abdominal   Peds negative pediatric ROS (+)  Hematology negative hematology ROS (+)   Anesthesia Other Findings   Reproductive/Obstetrics negative OB ROS                              Anesthesia Physical Anesthesia Plan  ASA: 4  Anesthesia Plan: MAC   Post-op Pain Management:    Induction:   PONV Risk Score and Plan: 1 and Propofol infusion  Airway Management Planned: Natural Airway and Simple Face Mask  Additional Equipment: None  Intra-op Plan:   Post-operative Plan:   Informed Consent: I have reviewed the patients History and Physical, chart, labs and discussed the procedure including the risks, benefits and alternatives for the proposed anesthesia with the patient or authorized representative who has indicated his/her understanding and acceptance.       Plan Discussed with: Anesthesiologist and CRNA  Anesthesia Plan Comments:          Anesthesia Quick Evaluation

## 2022-10-22 NOTE — Progress Notes (Signed)
[facility] Progress Note Patient: SYDNEY MINOT ZOX:[id number] DOB: 01-31-1951 DOA: 10/20/2022  DOS: the patient was seen and examined on 10/22/2022  Brief hospital course: PMH of ESRD on HD MWF, HTN, chronic pancreatitis presented to the hospital with complaints of abdominal pain and dark black loose stool. CT abdomen shows evidence of gastroenteritis with diffuse dilation of the small bowel. GI consulted. Underwent push enteroscopy on 5/15.  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 GI currently signed off. Assessment and Plan: GI bleed in patient with anemia of chronic disease and previous GI bleed: Hemoglobin stable around 7. GI was consulted. Hemoccult positive. No GI bleed seen on the endoscopy. Monitor H&H. GI currently signed off. PPI daily.   Nausea/vomiting/abdominal pain/diarrhea caused by enteritis causing SBO versus ileus:  Reported nausea at the time of my evaluation after the procedure. Monitor. Currently on renal diet. If persist will require repeat evaluation with imaging.   Chronic pancreatitis:  Patient states her abdominal pain has been going on for months.  May be due to poor p.o. intake and needing pancreas enzymes.   End-stage renal disease:  Nephrology consulted, HD per nephrology.   Hypertension:  Blood pressure mildly elevated. Will monitor. Continue home regimen.   Hyperlipidemia:  Continue statin  Subjective: Reports abdominal pain as well as nausea and vomiting.  Seen after the procedure.  Passing gas.  Physical Exam: General: in Mild distress, No Rash Cardiovascular: S1 and S2 Present, AORTIC SYSTOLIC Murmur Respiratory: Good respiratory effort, Bilateral Air entry present. No Crackles, No wheezes Abdomen: Bowel Sound present, mild upper abdomen tenderness Extremities: No edema Neuro: Alert and oriented x3, no new focal deficit  Data Reviewed: I have Reviewed nursing notes, Vitals, and Lab results. Since  last encounter, pertinent lab results CBC and BMP   . I have ordered test including CBC and BMP  .   Disposition: Status is: Inpatient Remains inpatient appropriate because: Monitor for H&H stability  SCDs Start: 10/20/22 2247   Family Communication: No one at bedside Level of care: Med-Surg   Vitals:   10/22/22 1503 10/22/22 1508 10/22/22 1518 10/22/22 1540  BP: (!) 166/76 (!) 167/64 (!) 167/64 (!) 177/74  Pulse: 89 86 84 94  Resp: 17 18 19 18   Temp:    99.4 F (37.4 C)  TempSrc:    Oral  SpO2: 98% 97% 98%   Weight:      Height:         Author: Lynden Oxford, MD 10/22/2022 7:42 PM  Please look on [email or web address] to find out who is on call.

## 2022-10-22 NOTE — Transfer of Care (Signed)
Immediate Anesthesia Transfer of Care Note  Patient: Ashley [name]  Procedure(s) Performed: ENTEROSCOPY SUBMUCOSAL TATTOO INJECTION HOT HEMOSTASIS (ARGON PLASMA COAGULATION/BICAP) SCLEROTHERAPY HEMOSTASIS CLIP PLACEMENT  Patient Location: Endoscopy Unit  Anesthesia Type:MAC  Level of Consciousness: awake and alert   Airway & Oxygen Therapy: Patient Spontanous Breathing and Patient connected to nasal cannula oxygen  Post-op Assessment: Report given to RN, Post -op Vital signs reviewed and stable, and Patient moving all extremities X 4  Post vital signs: Reviewed and stable  Last Vitals:  Vitals Value Taken Time  BP 174/66   Temp    Pulse 91 10/22/22 1457  Resp 16 10/22/22 1457  SpO2 99 % 10/22/22 1457  Vitals shown include unvalidated device data.  Last Pain:  Vitals:   10/22/22 1246  TempSrc: Temporal  PainSc: 0-No pain         Complications: No notable events documented.

## 2022-10-22 NOTE — Progress Notes (Signed)
[facility] Progress Note   Subjective:    Seen and examined patient on HD. She reports diarrhea and low appetite for few weeks prior to admit. So far, tolerating UFG 1L. Noted BP 175/75. Denies SOB, CP, and N/V.  Objective Vitals:   10/22/22 0836 10/22/22 0900 10/22/22 0930 10/22/22 1000  BP: (!) 163/71 (!) 166/72 (!) 165/72 (!) 167/73  Pulse: 81 82 86 86  Resp: 12 12 12 12   Temp:      TempSrc:      SpO2: 98% 98% 98% 98%  Weight:      Height:       Physical Exam General: Awake, alert, on RA, NAD Heart: S1 and S2; No murmurs, gallops, or rubs Lungs: Clear anteriorly Abdomen: Soft and non-tender Extremities: No LE edema Dialysis Access: L AVF (+) B/T   Filed Weights   10/21/22 0437 10/22/22 0353 10/22/22 0820  Weight: 46.4 kg 46.2 kg 43.7 kg    Intake/Output Summary (Last 24 hours) at 10/22/2022 1029 Last data filed at 10/22/2022 0900 Gross per 24 hour  Intake 360 ml  Output --  Net 360 ml    Additional Objective Labs: Basic Metabolic Panel: Recent Labs  Lab 10/20/22 1700 10/21/22 0100 10/21/22 1527  NA 138 136  --   K 3.9 3.6  --   CL 94* 95*  --   CO2 31 27  --   GLUCOSE 90 80  --   BUN 13 17  --   CREATININE 4.40* 4.92*  --   CALCIUM 8.6* 8.0*  --   PHOS  --   --  3.5   Liver Function Tests: Recent Labs  Lab 10/20/22 1700  AST 18  ALT 11  ALKPHOS 37*  BILITOT 0.4  PROT 6.5  ALBUMIN 3.4*   Recent Labs  Lab 10/20/22 2007  LIPASE 45   CBC: Recent Labs  Lab 10/20/22 1700 10/21/22 0100 10/22/22 0900  WBC 3.2* 3.2* 3.3*  HGB 8.0* 7.4* 7.5*  HCT 25.0* 22.5* 22.6*  MCV 94.3 92.6 90.0  PLT 204 188 257   Blood Culture    Component Value Date/Time   SDES URINE, RANDOM 06/20/2017 2222   SPECREQUEST NONE 06/20/2017 2222   CULT MULTIPLE SPECIES PRESENT, SUGGEST RECOLLECTION (A) 06/20/2017 2222   REPTSTATUS 06/22/2017 FINAL 06/20/2017 2222    Cardiac Enzymes: No results for input(s): "CKTOTAL", "CKMB", "CKMBINDEX",  "TROPONINI" in the last 168 hours. CBG: No results for input(s): "GLUCAP" in the last 168 hours. Iron Studies: No results for input(s): "IRON", "TIBC", "TRANSFERRIN", "FERRITIN" in the last 72 hours. Lab Results  Component Value Date   INR 1.1 02/06/2021   INR 1.1 11/13/2019   INR 1.0 04/28/2018   Studies/Results: DG Abd Portable 1V  Result Date: 10/21/2022 CLINICAL DATA:  Small bowel obstruction EXAM: PORTABLE ABDOMEN - 1 VIEW COMPARISON:  09/14/2019 and CT abdomen 10/20/2022 FINDINGS: Borderline enlargement of the cardiopericardial silhouette. Unremarkable bowel gas pattern with no dilated bowel currently appreciable. Atherosclerosis is present, including aortoiliac atherosclerotic disease. IMPRESSION: 1. Unremarkable bowel gas pattern. 2. Borderline enlargement of the cardiopericardial silhouette. 3. Atherosclerosis. Electronically Signed   By: Gaylyn Rong M.D.   On: 10/21/2022 10:22   CT ABDOMEN PELVIS W CONTRAST  Result Date: 10/20/2022 CLINICAL DATA:  Nonlocalized abdominal pain. EXAM: CT ABDOMEN AND PELVIS WITH CONTRAST TECHNIQUE: Multidetector CT imaging of the abdomen and pelvis was performed using the standard protocol following bolus administration of intravenous contrast. RADIATION DOSE REDUCTION: This exam was performed according  to the departmental dose-optimization program which includes automated exposure control, adjustment of the mA and/or kV according to patient size and/or use of iterative reconstruction technique. CONTRAST:  75mL OMNIPAQUE IOHEXOL 350 MG/ML SOLN COMPARISON:  CT without contrast 09/03/2022, CT with contrast 04/12/2021 FINDINGS: Lower chest: There are mild chronic interstitial changes in the posterior lung bases. Lung bases are clear of infiltrates. There is a posterior left diaphragmatic fat herniation. The heart is slightly enlarged. There are coronary artery calcifications. Hepatobiliary: No focal liver abnormality is seen. No calcified gallstones,  gallbladder wall thickening, or biliary dilatation. The main portal vein is upper limit of normal in caliber. Pancreas: The main pancreatic duct is slightly prominent at 4 mm, unchanged. There is no pancreatic mass enhancement. No adjacent inflammatory changes. Spleen: No abnormality. Adrenals/Urinary Tract: Symmetric adrenal hyperplasia. No mass enhancement. Both kidneys atrophic and small in length, right 5.9 cm length, left 6 cm length. There are few small simple cysts bilaterally, and multiple bilateral tiny cortical hypodensities which are too small to characterize. There is no interval change in the renal enhancement, no mass enhancement. No follow-up imaging is recommended. There is no urinary stone or obstruction. No contrast had been excreted into the collecting systems in the delayed phase on either side. The bladder is contracted and not well seen. Stomach/Bowel: Moderate diffuse gastric thickening continues to be noted particularly in the antrum although it has been seen previously. There is mild diffuse dilatation of the small bowel up to 3.2 cm but no visible transition. There are multiple thickened small bowel segments in the mid to lower abdomen interspersed with non-thickened segments. Findings could be due to enteritis with ileus and/or enteritis with a low-grade small-bowel obstruction. The appendix is normal. There is no evidence of acute colitis or diverticulitis. Vascular/Lymphatic: There is heavy aortoiliac calcific plaque without critical stenosis or aneurysm. There is branch vessel atherosclerosis. No abdominal or pelvic adenopathy. Reproductive: Status post hysterectomy. No adnexal masses. Other: No abdominal wall hernia or abnormality. No abdominopelvic ascites. There is no pneumatosis, no free air or free hemorrhage. Mild body wall anasarca is again shown. Musculoskeletal: There are degenerative changes of the spine. No acute or significant osseous findings. IMPRESSION: 1. Gastroenteritis  with mild diffuse dilatation of the small bowel but no visible transition. Findings could be due to ileus with enteritis and/or enteritis with a low-grade small-bowel obstruction. No free air or pneumatosis. 2. Aortic and coronary artery atherosclerosis. 3. Mild cardiomegaly. 4. Mild body wall anasarca. 5. Chronic mild prominence of the main pancreatic duct at 4 mm. 6. Atrophic kidneys with small simple cysts and tiny cortical hypodensities which are too small to characterize. No interval change. No follow-up imaging recommended. Aortic Atherosclerosis (ICD10-I70.0). Electronically Signed   By: Almira Bar M.D.   On: 10/20/2022 21:16    Medications:   amLODipine  10 mg Oral Q1200   atorvastatin  20 mg Oral QHS   Chlorhexidine Gluconate Cloth  6 each Topical Q0600   doxercalciferol  3 mcg Intravenous Q M,W,F-HD   hydrALAZINE  75 mg Oral Q8H   pantoprazole (PROTONIX) IV  40 mg Intravenous Q12H    Dialysis Orders: [facility] MWF  3.5h   350/500  46.8kg  2/2.5 bath  LUA AVF   Heparin none - last HD 5/13, post wt 46.9kg   - hectorol 3 mcg IV tiw - venofer 50mg  IV weekly - mircera 150 mcg IV q 2 wks, last 5/10, due 5/24  Home meds include - sensipar 60 hs,  catapres 0.1 qd, remeron, protonix, renvela 800 tid, norvasc 10, lipitor, hydralazine 75 tid, prns   Assessment/Plan: Abd pain/ diarrhea - suspected gastroenteritis w/ CT findings, vs low-grade SBO. Keeping npo for now. Per pmd.  Acute / chronic anemia - Hb from 8 to now 7.5, usual is 9 -10. Not on any blood thinners, hx of perf GU in the past. 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 Per pmd/ GI. Transfuse for Hgb < 7.  ESRD - on HD MWF. Had full HD yesterday. On HD.  HTN/ volume - BP's are wnl, no vol overload on exam. Came in under EDW today. Continue Small UF. She's been having diarrhea and low appetite prior to admit. May need to lower EDW at discharge. Cont BP meds are you are doing.  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 Just had esa on 5/10, dose not due yet, follow. Transfuse for Hgb < 7. MBD ckd - CCa and phos in range. Resume binders when eating. Cont IV vdra w/ HD.  H/o CVA  Salome Holmes, NP [facility] 10/22/2022,10:29 AM  LOS: 1 day

## 2022-10-22 NOTE — Anesthesia Postprocedure Evaluation (Signed)
Anesthesia Post Note  Patient: Manhattan E Fertig  Procedure(s) Performed: ENTEROSCOPY SUBMUCOSAL TATTOO INJECTION HOT HEMOSTASIS (ARGON PLASMA COAGULATION/BICAP) SCLEROTHERAPY HEMOSTASIS CLIP PLACEMENT     Patient location during evaluation: PACU Anesthesia Type: MAC Level of consciousness: awake and alert Pain management: pain level controlled Vital Signs Assessment: post-procedure vital signs reviewed and stable Respiratory status: spontaneous breathing, nonlabored ventilation, respiratory function stable and patient connected to nasal cannula oxygen Cardiovascular status: stable and blood pressure returned to baseline Postop Assessment: no apparent nausea or vomiting Anesthetic complications: no   No notable events documented.  Last Vitals:  Vitals:   10/22/22 1508 10/22/22 1518  BP: (!) 167/64 (!) 167/64  Pulse: 86 84  Resp: 18 19  Temp:    SpO2: 97% 98%    Last Pain:  Vitals:   10/22/22 1508  TempSrc:   PainSc: 6                  Amauris Debois      

## 2022-10-22 NOTE — Progress Notes (Signed)
   10/22/22 1221  Vitals  Temp 98.5 F (36.9 C)  Pulse Rate 83  Resp 13  BP (!) 150/71  SpO2 98 %  O2 Device Room Air  Weight 42.7 kg  Type of Weight Post-Dialysis  Post Treatment  Dialyzer Clearance Lightly streaked  Duration of HD Treatment -hour(s) 3.5 hour(s)  Hemodialysis Intake (mL) 0 mL  Liters Processed 73.5  Fluid Removed (mL) 1000 mL  Tolerated HD Treatment Yes  AVG/AVF Arterial Site Held (minutes) 10 minutes  AVG/AVF Venous Site Held (minutes) 10 minutes   Received patient in bed to unit.  Alert and oriented.  Informed consent signed and in chart.   TX duration:3.5  Patient tolerated well.  Transported back to the room  Alert, without acute distress.  Hand-off given to patient's nurse.   Access used: LUAF Access issues: no complications  Total UF removed: 1000 Medication(s) given: none   [facility]

## 2022-10-22 NOTE — Anesthesia Procedure Notes (Signed)
Procedure Name: MAC Date/Time: 10/22/2022 2:18 PM  Performed by: Nils Pyle, CRNAPre-anesthesia Checklist: Patient identified, Emergency Drugs available, Suction available and Patient being monitored Patient Re-evaluated:Patient Re-evaluated prior to induction Oxygen Delivery Method: Nasal cannula Preoxygenation: Pre-oxygenation with 100% oxygen Induction Type: IV induction Placement Confirmation: positive ETCO2 and breath sounds checked- equal and bilateral Dental Injury: Teeth and Oropharynx as per pre-operative assessment

## 2022-10-22 NOTE — Progress Notes (Signed)
Pt receives out-pt HD at [facility] on MWF. Will assist as needed.   Cedricka Sackrider Renal Navigator [phone] 

## 2022-10-23 ENCOUNTER — Other Ambulatory Visit: Payer: Self-pay | Admitting: Internal Medicine

## 2022-10-23 DIAGNOSIS — K529 Noninfective gastroenteritis and colitis, unspecified: Secondary | ICD-10-CM | POA: Diagnosis not present

## 2022-10-23 LAB — CBC
HCT: 25 % — ABNORMAL LOW (ref 36.0–46.0)
Hemoglobin: 8.3 g/dL — ABNORMAL LOW (ref 12.0–15.0)
MCH: 30.9 pg (ref 26.0–34.0)
MCHC: 33.2 g/dL (ref 30.0–36.0)
MCV: 92.9 fL (ref 80.0–100.0)
Platelets: 231 10*3/uL (ref 150–400)
RBC: 2.69 MIL/uL — ABNORMAL LOW (ref 3.87–5.11)
RDW: 19.9 % — ABNORMAL HIGH (ref 11.5–15.5)
WBC: 5.3 10*3/uL (ref 4.0–10.5)
nRBC: 0 % (ref 0.0–0.2)

## 2022-10-23 LAB — MAGNESIUM: Magnesium: 1.8 mg/dL (ref 1.7–2.4)

## 2022-10-23 LAB — BASIC METABOLIC PANEL
Anion gap: 12 (ref 5–15)
BUN: 10 mg/dL (ref 8–23)
CO2: 27 mmol/L (ref 22–32)
Calcium: 8.5 mg/dL — ABNORMAL LOW (ref 8.9–10.3)
Chloride: 95 mmol/L — ABNORMAL LOW (ref 98–111)
Creatinine, Ser: 4.34 mg/dL — ABNORMAL HIGH (ref 0.44–1.00)
GFR, Estimated: 10 mL/min — ABNORMAL LOW (ref >60)
Glucose, Bld: 75 mg/dL (ref 70–99)
Potassium: 4 mmol/L (ref 3.5–5.1)
Sodium: 134 mmol/L — ABNORMAL LOW (ref 135–145)

## 2022-10-23 MED ORDER — PANTOPRAZOLE SODIUM 40 MG PO TBEC: 40.0000 mg | DELAYED_RELEASE_TABLET | Freq: Every day | ORAL | 2 refills | Status: DC

## 2022-10-23 MED ORDER — HYDROCODONE-ACETAMINOPHEN 5-325 MG PO TABS: 1.0000 | ORAL_TABLET | Freq: Four times a day (QID) | ORAL | 0 refills | Status: DC | PRN

## 2022-10-23 MED ORDER — PANCRELIPASE (LIP-PROT-AMYL) 36000-114000 UNITS PO CPEP: 36000.0000 [IU] | ORAL_CAPSULE | Freq: Three times a day (TID) | ORAL | 0 refills | Status: DC

## 2022-10-23 NOTE — Progress Notes (Signed)
[facility] Progress Note   Subjective:    Seen and examined patient at bedside. Tolerated yesterday's HD with net UF 1L. 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 Hgb currently 8.3. Noted plan for discharge today.  Objective Vitals:   10/22/22 2103 10/23/22 0428 10/23/22 0500 10/23/22 0844  BP: 132/77 (!) 141/70  (!) 156/68  Pulse: 94 90  86  Resp:    17  Temp: (!) 100.4 F (38 C) 99.2 F (37.3 C)  99.1 F (37.3 C)  TempSrc: Oral Oral  Oral  SpO2: 99% 93%  96%  Weight:   48.7 kg   Height:       Physical Exam General: Awake, alert, on RA, NAD Heart: S1 and S2; No murmurs, gallops, or rubs Lungs: Clear anteriorly Abdomen: Soft and non-tender Extremities: No LE edema Dialysis Access: L AVF (+) B/T   Filed Weights   10/22/22 1221 10/22/22 1246 10/23/22 0500  Weight: 42.7 kg 45.8 kg 48.7 kg    Intake/Output Summary (Last 24 hours) at 10/23/2022 1157 Last data filed at 10/22/2022 1712 Gross per 24 hour  Intake 19.83 ml  Output 1000 ml  Net -980.17 ml    Additional Objective Labs: Basic Metabolic Panel: Recent Labs  Lab 10/21/22 0100 10/21/22 1527 10/22/22 0900 10/23/22 0033  NA 136  --  135 134*  K 3.6  --  3.9 4.0  CL 95*  --  94* 95*  CO2 27  --  29 27  GLUCOSE 80  --  70 75  BUN 17  --  25* 10  CREATININE 4.92*  --  7.93* 4.34*  CALCIUM 8.0*  --  8.2* 8.5*  PHOS  --  3.5  --   --    Liver Function Tests: Recent Labs  Lab 10/20/22 1700  AST 18  ALT 11  ALKPHOS 37*  BILITOT 0.4  PROT 6.5  ALBUMIN 3.4*   Recent Labs  Lab 10/20/22 2007  LIPASE 45   CBC: Recent Labs  Lab 10/20/22 1700 10/21/22 0100 10/22/22 0900 10/23/22 0033  WBC 3.2* 3.2* 3.3* 5.3  HGB 8.0* 7.4* 7.5* 8.3*  HCT 25.0* 22.5* 22.6* 25.0*  MCV 94.3 92.6 90.0 92.9  PLT 204 188 257 231   Blood Culture    Component Value Date/Time   SDES URINE, RANDOM 06/20/2017 2222   SPECREQUEST NONE  06/20/2017 2222   CULT MULTIPLE SPECIES PRESENT, SUGGEST RECOLLECTION (A) 06/20/2017 2222   REPTSTATUS 06/22/2017 FINAL 06/20/2017 2222    Cardiac Enzymes: No results for input(s): "CKTOTAL", "CKMB", "CKMBINDEX", "TROPONINI" in the last 168 hours. CBG: No results for input(s): "GLUCAP" in the last 168 hours. Iron Studies: No results for input(s): "IRON", "TIBC", "TRANSFERRIN", "FERRITIN" in the last 72 hours. Lab Results  Component Value Date   INR 1.1 02/06/2021   INR 1.1 11/13/2019   INR 1.0 04/28/2018   Studies/Results: No results found.  Medications:   amLODipine  10 mg Oral Q1200   atorvastatin  20 mg Oral QHS   Chlorhexidine Gluconate Cloth  6 each Topical Q0600   cinacalcet  60 mg Oral QPM   dicyclomine  10 mg Oral TID AC & HS   doxercalciferol  3 mcg Intravenous Q M,W,F-HD   hydrALAZINE  75 mg Oral Q8H   lipase/protease/amylase  36,000 Units Oral TID AC   mirtazapine  7.5 mg Oral QHS   pantoprazole  40 mg Oral Daily   sevelamer carbonate  800 mg  Oral TID WC    Dialysis Orders: [facility] MWF  3.5h   350/500  46.8kg  2/2.5 bath  LUA AVF   Heparin none - last HD 5/13, post wt 46.9kg   - hectorol 3 mcg IV tiw - venofer 50mg  IV weekly - mircera 150 mcg IV q 2 wks, last 5/10, due 5/24   Home meds include - sensipar 60 hs, catapres 0.1 qd, remeron, protonix, renvela 800 tid, norvasc 10, lipitor, hydralazine 75 tid, prns    Assessment/Plan: Abd pain/ diarrhea - suspected gastroenteritis w/ CT findings, vs low-grade SBO. Per pmd.  Acute / chronic anemia - Hb usually is 9 -10. Not on any blood thinners, hx of perf GU in the past. 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 Per pmd/ GI. Hgb today 8.3. Transfuse for Hgb < 7. ESRD - on HD MWF. Had full HD yesterday. Next HD 5/17 in outpatient.  HTN/ volume - BP's are wnl, no vol  overload on exam. Came in under EDW today. Continue Small UF. She's been having diarrhea and low appetite prior to admit. May need to lower EDW at discharge. Cont BP meds are you are doing.  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 Just had esa on 5/10, dose not due yet, follow. Transfuse for Hgb < 7. MBD ckd - CCa and phos in range. Resume binders when eating. Cont IV vdra w/ HD.  H/o CVA Okay for discharge from renal standpoint.   Salome Holmes, NP [facility] 10/23/2022,11:57 AM  LOS: 2 days

## 2022-10-23 NOTE — Progress Notes (Signed)
D/C order noted. Contacted [facility] to advise clinic of pt's d/c today and that pt should resume care tomorrow.   Tula Schryver Renal Navigator [phone] 

## 2022-10-23 NOTE — Progress Notes (Signed)
Patient discharged to discharge lounge to await ride.

## 2022-10-23 NOTE — Plan of Care (Signed)
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   Enteritis Chronic pancreatitis Cardiac murmur - outpatient ECHO and Cardiology appt arranged  Aranesp: Given: No    Last Hgb: 8.3 PRBC's Given: No ESA dose for discharge: Resume Mircera 150 mcg IV q 2 weeks  IV Iron dose at discharge: Resume weekly Venofer  Heparin change: N/A  EDW Change: Yes New EDW: Lower to 45.5kg  Bath Change: No  Access intervention/Change: No   Hectorol change: No  Discharge Labs: Calcium 8.5 Phosphorus 3.5 Albumin 3.4 K+ 4.0  IV Antibiotics: No   On Coumadin?: No   OTHER/APPTS/LAB ORDERS:    D/C Meds to be reconciled by nurse after every discharge.  Completed By: Salome Holmes, NP-C 10/23/2022, 7:34 PM  [facility] Pager: [phone]  Reviewed by: MD:______ RN_______

## 2022-10-23 NOTE — Discharge Summary (Signed)
Physician Discharge Summary   Patient: Ashley [name] MRN: [id number] DOB: 08/29/1950  Admit date:     10/20/2022  Discharge date: 10/23/2022  Discharge Physician: Lynden Oxford  PCP: Raymon Mutton., FNP  Recommendations at discharge: Follow-up with PCP in 1 week.  Follow-up with cardiology as recommended.   Follow-up Information     Raymon Mutton., FNP. Schedule an appointment as soon as possible for a visit in 1 week(s).   Specialty: Family Medicine Contact information: [address] [place] [address] [phone]         Rinaldo Cloud, MD. Go on 11/24/2022.   Specialty: Cardiology Why: 2PM Contact information: [address] [place] [address] [phone]                Discharge Diagnoses: Principal Problem:   Gastroenteritis Active Problems:   Acute on chronic anemia   Hypertension   ESRD on hemodialysis (HCC)   History of CVA (cerebrovascular accident)   GIB (gastrointestinal bleeding)   Anemia due to chronic blood loss   Heme positive stool   AVM (arteriovenous malformation) of small bowel, acquired with hemorrhage  Hospital Course: PMH of ESRD on HD MWF, HTN, chronic pancreatitis presented to the hospital with complaints of abdominal pain and dark black loose stool. CT abdomen shows evidence of gastroenteritis with diffuse dilation of the small bowel. GI consulted. Underwent push enteroscopy on 5/15.  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 GI currently signed off. Assessment and Plan  GI bleed in patient with anemia of chronic disease and previous GI bleed: Hemoglobin stable around 7. GI was consulted. Hemoccult positive. No GI bleed seen on the endoscopy. GI currently signed off. PPI daily.  Patient is relatively stable.   Nausea/vomiting/abdominal pain/diarrhea caused by enteritis causing SBO versus ileus:  Reported nausea at the time of my evaluation after the  procedure. Monitor. Currently on renal diet. Tolerated oral diet.  Will continue current regimen.   Chronic pancreatitis:  Patient states her abdominal pain has been going on for months.  May be due to poor p.o. intake and needing pancreas enzymes. Outpatient follow-up with GI recommended.   End-stage renal disease:  Nephrology consulted, HD per nephrology.   Hypertension:  Blood pressure mildly elevated. Will monitor. Continue home regimen.   Hyperlipidemia:  Continue statin  Cardiac murmur. Outpatient echocardiogram as well as follow-up with cardiology recommended and personally arranged.    Pain control - [employer] Controlled Substance Reporting System database was reviewed. and patient was instructed, not to drive, operate heavy machinery, perform activities at heights, swimming or participation in water activities or provide baby-sitting services while on Pain, Sleep and Anxiety Medications; until their outpatient Physician has advised to do so again. Also recommended to not to take more than prescribed Pain, Sleep and Anxiety Medications.  Consultants:  GI  Procedures performed:  Push enteroscopy  DISCHARGE MEDICATION: Allergies as of 10/23/2022       Reactions   Aspirin Nausea And Vomiting   Stomach ache   Ibuprofen Nausea And Vomiting   Stomach ache        Medication List     STOP taking these medications    cloNIDine 0.1 MG tablet Commonly known as: CATAPRES       TAKE these medications    amLODipine 10 MG tablet Commonly known as: NORVASC Take 1 tablet (10 mg total) by mouth daily. What changed: when to take this   atorvastatin  20 MG tablet Commonly known as: LIPITOR Take 20 mg by mouth at bedtime.   cinacalcet 60 MG tablet Commonly known as: SENSIPAR Take 60 mg by mouth every evening.   DIALYVITE 800 WITH ZINC 0.8 MG Tabs Take 1 tablet by mouth daily.   hydrALAZINE 25 MG tablet Commonly known as: APRESOLINE Take 3 tablets (75  mg total) by mouth every 8 (eight) hours.   lidocaine-prilocaine cream Commonly known as: EMLA Apply 1 application topically See admin instructions. Apply small amount to access site on Tuesday, Thursday, Saturday one hour before dialysis. Cover with occlusive dressing (saran wrap)   lipase/protease/amylase 16109 UNITS Cpep capsule Commonly known as: CREON Take 1 capsule (36,000 Units total) by mouth 3 (three) times daily before meals.   mirtazapine 7.5 MG tablet Commonly known as: REMERON Take 7.5 mg by mouth at bedtime.   ondansetron 4 MG disintegrating tablet Commonly known as: Zofran ODT Take 1 tablet (4 mg total) by mouth every 8 (eight) hours as needed for nausea or vomiting.   pantoprazole 40 MG tablet Commonly known as: PROTONIX Take 1 tablet (40 mg total) by mouth daily.   senna-docusate 8.6-50 MG tablet Commonly known as: Senokot-S Take 1 tablet by mouth at bedtime as needed for mild constipation.   sevelamer carbonate 800 MG tablet Commonly known as: RENVELA Take 800 mg by mouth 3 (three) times daily with meals.       Disposition: Home Diet recommendation: Renal diet  Discharge Exam: Vitals:   10/22/22 2103 10/23/22 0428 10/23/22 0500 10/23/22 0844  BP: 132/77 (!) 141/70  (!) 156/68  Pulse: 94 90  86  Resp:    17  Temp: (!) 100.4 F (38 C) 99.2 F (37.3 C)  99.1 F (37.3 C)  TempSrc: Oral Oral  Oral  SpO2: 99% 93%  96%  Weight:   48.7 kg   Height:       General: Appear in no distress; no visible Abnormal Neck Mass Or lumps, Conjunctiva normal Cardiovascular: S1 and S2 Present, aortic systolic  Murmur, Respiratory: good respiratory effort, Bilateral Air entry present and CTA, no Crackles, no wheezes Abdomen: Bowel Sound present, mild diffuse tender  Extremities: no Pedal edema Neurology: alert and oriented to time, place, and person  Filed Weights   10/22/22 1221 10/22/22 1246 10/23/22 0500  Weight: 42.7 kg 45.8 kg 48.7 kg   Condition at  discharge: stable  The results of significant diagnostics from this hospitalization (including imaging, microbiology, ancillary and laboratory) are listed below for reference.   Imaging Studies: DG Abd Portable 1V  Result Date: 10/21/2022 CLINICAL DATA:  Small bowel obstruction EXAM: PORTABLE ABDOMEN - 1 VIEW COMPARISON:  09/14/2019 and CT abdomen 10/20/2022 FINDINGS: Borderline enlargement of the cardiopericardial silhouette. Unremarkable bowel gas pattern with no dilated bowel currently appreciable. Atherosclerosis is present, including aortoiliac atherosclerotic disease. IMPRESSION: 1. Unremarkable bowel gas pattern. 2. Borderline enlargement of the cardiopericardial silhouette. 3. Atherosclerosis. Electronically Signed   By: Gaylyn Rong M.D.   On: 10/21/2022 10:22   CT ABDOMEN PELVIS W CONTRAST  Result Date: 10/20/2022 CLINICAL DATA:  Nonlocalized abdominal pain. EXAM: CT ABDOMEN AND PELVIS WITH CONTRAST TECHNIQUE: Multidetector CT imaging of the abdomen and pelvis was performed using the standard protocol following bolus administration of intravenous contrast. RADIATION DOSE REDUCTION: This exam was performed according to the departmental dose-optimization program which includes automated exposure control, adjustment of the mA and/or kV according to patient size and/or use of iterative reconstruction technique. CONTRAST:  75mL OMNIPAQUE IOHEXOL  350 MG/ML SOLN COMPARISON:  CT without contrast 09/03/2022, CT with contrast 04/12/2021 FINDINGS: Lower chest: There are mild chronic interstitial changes in the posterior lung bases. Lung bases are clear of infiltrates. There is a posterior left diaphragmatic fat herniation. The heart is slightly enlarged. There are coronary artery calcifications. Hepatobiliary: No focal liver abnormality is seen. No calcified gallstones, gallbladder wall thickening, or biliary dilatation. The main portal vein is upper limit of normal in caliber. Pancreas: The main  pancreatic duct is slightly prominent at 4 mm, unchanged. There is no pancreatic mass enhancement. No adjacent inflammatory changes. Spleen: No abnormality. Adrenals/Urinary Tract: Symmetric adrenal hyperplasia. No mass enhancement. Both kidneys atrophic and small in length, right 5.9 cm length, left 6 cm length. There are few small simple cysts bilaterally, and multiple bilateral tiny cortical hypodensities which are too small to characterize. There is no interval change in the renal enhancement, no mass enhancement. No follow-up imaging is recommended. There is no urinary stone or obstruction. No contrast had been excreted into the collecting systems in the delayed phase on either side. The bladder is contracted and not well seen. Stomach/Bowel: Moderate diffuse gastric thickening continues to be noted particularly in the antrum although it has been seen previously. There is mild diffuse dilatation of the small bowel up to 3.2 cm but no visible transition. There are multiple thickened small bowel segments in the mid to lower abdomen interspersed with non-thickened segments. Findings could be due to enteritis with ileus and/or enteritis with a low-grade small-bowel obstruction. The appendix is normal. There is no evidence of acute colitis or diverticulitis. Vascular/Lymphatic: There is heavy aortoiliac calcific plaque without critical stenosis or aneurysm. There is branch vessel atherosclerosis. No abdominal or pelvic adenopathy. Reproductive: Status post hysterectomy. No adnexal masses. Other: No abdominal wall hernia or abnormality. No abdominopelvic ascites. There is no pneumatosis, no free air or free hemorrhage. Mild body wall anasarca is again shown. Musculoskeletal: There are degenerative changes of the spine. No acute or significant osseous findings. IMPRESSION: 1. Gastroenteritis with mild diffuse dilatation of the small bowel but no visible transition. Findings could be due to ileus with enteritis and/or  enteritis with a low-grade small-bowel obstruction. No free air or pneumatosis. 2. Aortic and coronary artery atherosclerosis. 3. Mild cardiomegaly. 4. Mild body wall anasarca. 5. Chronic mild prominence of the main pancreatic duct at 4 mm. 6. Atrophic kidneys with small simple cysts and tiny cortical hypodensities which are too small to characterize. No interval change. No follow-up imaging recommended. Aortic Atherosclerosis (ICD10-I70.0). Electronically Signed   By: Almira Bar M.D.   On: 10/20/2022 21:16    Microbiology: Results for orders placed or performed during the hospital encounter of 04/11/21  Resp Panel by RT-PCR (Flu A&B, Covid) Nasopharyngeal Swab     Status: None   Collection Time: 04/12/21  9:26 AM   Specimen: Nasopharyngeal Swab; Nasopharyngeal(NP) swabs in vial transport medium  Result Value Ref Range Status   SARS Coronavirus 2 by RT PCR NEGATIVE NEGATIVE Final    Comment: (NOTE) SARS-CoV-2 target nucleic acids are NOT DETECTED.  The SARS-CoV-2 RNA is generally detectable in upper respiratory specimens during the acute phase of infection. The lowest concentration of SARS-CoV-2 viral copies this assay can detect is 138 copies/mL. A negative result does not preclude SARS-Cov-2 infection and should not be used as the sole basis for treatment or other patient management decisions. A negative result may occur with  improper specimen collection/handling, submission of specimen other than nasopharyngeal swab,  presence of viral mutation(s) within the areas targeted by this assay, and inadequate number of viral copies(<138 copies/mL). A negative result must be combined with clinical observations, patient history, and epidemiological information. The expected result is Negative.  Fact Sheet for Patients:  BloggerCourse.com  Fact Sheet for Healthcare Providers:  SeriousBroker.it  This test is no t yet approved or cleared by  the [place] FDA and  has been authorized for detection and/or diagnosis of SARS-CoV-2 by FDA under an Emergency Use Authorization (EUA). This EUA will remain  in effect (meaning this test can be used) for the duration of the COVID-19 declaration under Section 564(b)(1) of the Act, 21 U.S.C.section 360bbb-3(b)(1), unless the authorization is terminated  or revoked sooner.       Influenza A by PCR NEGATIVE NEGATIVE Final   Influenza B by PCR NEGATIVE NEGATIVE Final    Comment: (NOTE) The Xpert Xpress SARS-CoV-2/FLU/RSV plus assay is intended as an aid in the diagnosis of influenza from Nasopharyngeal swab specimens and should not be used as a sole basis for treatment. Nasal washings and aspirates are unacceptable for Xpert Xpress SARS-CoV-2/FLU/RSV testing.  Fact Sheet for Patients: BloggerCourse.com  Fact Sheet for Healthcare Providers: SeriousBroker.it  This test is not yet approved or cleared by the [place] FDA and has been authorized for detection and/or diagnosis of SARS-CoV-2 by FDA under an Emergency Use Authorization (EUA). This EUA will remain in effect (meaning this test can be used) for the duration of the COVID-19 declaration under Section 564(b)(1) of the Act, 21 U.S.C. section 360bbb-3(b)(1), unless the authorization is terminated or revoked.  Performed at [facility], [address], [place], [place] [address]    Labs: CBC: Recent Labs  Lab 10/20/22 1700 10/21/22 0100 10/22/22 0900 10/23/22 0033  WBC 3.2* 3.2* 3.3* 5.3  HGB 8.0* 7.4* 7.5* 8.3*  HCT 25.0* 22.5* 22.6* 25.0*  MCV 94.3 92.6 90.0 92.9  PLT 204 188 257 231   Basic Metabolic Panel: Recent Labs  Lab 10/20/22 1700 10/21/22 0100 10/21/22 1527 10/22/22 0900 10/23/22 0033  NA 138 136  --  135 134*  K 3.9 3.6  --  3.9 4.0  CL 94* 95*  --  94* 95*  CO2 31 27  --  29 27  GLUCOSE 90 80  --  70 75  BUN 13 17  --  25* 10   CREATININE 4.40* 4.92*  --  7.93* 4.34*  CALCIUM 8.6* 8.0*  --  8.2* 8.5*  MG  --   --   --   --  1.8  PHOS  --   --  3.5  --   --    Liver Function Tests: Recent Labs  Lab 10/20/22 1700  AST 18  ALT 11  ALKPHOS 37*  BILITOT 0.4  PROT 6.5  ALBUMIN 3.4*   CBG: No results for input(s): "GLUCAP" in the last 168 hours.  Discharge time spent: greater than 30 minutes.  Signed: Lynden Oxford, MD [facility] 10/23/2022

## 2022-10-23 NOTE — Progress Notes (Signed)
Script for norco sent to [facility] at [facility] per pharmacy request.

## 2022-10-24 ENCOUNTER — Telehealth: Payer: Self-pay | Admitting: Nephrology

## 2022-10-24 NOTE — Telephone Encounter (Signed)
Transition of Care - Initial Contact from Inpatient Facility  Date of discharge: 10/23/22 Date of contact: 10/24/22  Method: Phone Spoke to: Patient  Patient contacted to discuss transition of care from recent inpatient hospitalization. Patient was admitted to [facility] from .5/13- 10/23/22.. with discharge diagnosis of .GIB/Nausea/vomiting/abdominal pain/diarrhea caused by enteritis / Chronic pancreatitis.  The discharge medication list was reviewed. Patient understands the changes and has no concerns.   Patient will return to his/her outpatient HD unit on: Monday  10/27/22  No other concerns at this time.

## 2022-10-26 ENCOUNTER — Encounter (HOSPITAL_COMMUNITY): Payer: Self-pay | Admitting: Gastroenterology

## 2023-02-07 ENCOUNTER — Other Ambulatory Visit: Payer: Self-pay | Admitting: Family

## 2023-02-07 DIAGNOSIS — Z1231 Encounter for screening mammogram for malignant neoplasm of breast: Secondary | ICD-10-CM

## 2023-02-18 ENCOUNTER — Ambulatory Visit
Admission: RE | Admit: 2023-02-18 | Discharge: 2023-02-18 | Disposition: A | Discharge status: Home / Self Care | Payer: 59 | Source: Ambulatory Visit | Attending: Family

## 2023-02-18 DIAGNOSIS — Z1231 Encounter for screening mammogram for malignant neoplasm of breast: Secondary | ICD-10-CM

## 2023-03-05 ENCOUNTER — Other Ambulatory Visit: Payer: Self-pay

## 2023-03-05 ENCOUNTER — Emergency Department (HOSPITAL_COMMUNITY): Payer: 59

## 2023-03-05 ENCOUNTER — Encounter (HOSPITAL_COMMUNITY): Payer: Self-pay

## 2023-03-05 ENCOUNTER — Emergency Department (HOSPITAL_COMMUNITY)
Admission: EM | Admit: 2023-03-05 | Discharge: 2023-03-05 | Disposition: A | Discharge status: Home / Self Care | Payer: 59 | Attending: Emergency Medicine | Admitting: Emergency Medicine

## 2023-03-05 DIAGNOSIS — I120 Hypertensive chronic kidney disease with stage 5 chronic kidney disease or end stage renal disease: Secondary | ICD-10-CM | POA: Insufficient documentation

## 2023-03-05 DIAGNOSIS — R1084 Generalized abdominal pain: Secondary | ICD-10-CM | POA: Diagnosis not present

## 2023-03-05 DIAGNOSIS — Z992 Dependence on renal dialysis: Secondary | ICD-10-CM | POA: Diagnosis not present

## 2023-03-05 DIAGNOSIS — Z79899 Other long term (current) drug therapy: Secondary | ICD-10-CM | POA: Diagnosis not present

## 2023-03-05 DIAGNOSIS — N186 End stage renal disease: Secondary | ICD-10-CM | POA: Insufficient documentation

## 2023-03-05 DIAGNOSIS — K921 Melena: Secondary | ICD-10-CM | POA: Diagnosis present

## 2023-03-05 LAB — CBC WITH DIFFERENTIAL/PLATELET
Abs Immature Granulocytes: 0.02 10*3/uL (ref 0.00–0.07)
Basophils Absolute: 0 10*3/uL (ref 0.0–0.1)
Basophils Relative: 1 %
Eosinophils Absolute: 0.2 10*3/uL (ref 0.0–0.5)
Eosinophils Relative: 4 %
HCT: 30.8 % — ABNORMAL LOW (ref 36.0–46.0)
Hemoglobin: 9.8 g/dL — ABNORMAL LOW (ref 12.0–15.0)
Immature Granulocytes: 0 %
Lymphocytes Relative: 31 %
Lymphs Abs: 1.5 10*3/uL (ref 0.7–4.0)
MCH: 28.8 pg (ref 26.0–34.0)
MCHC: 31.8 g/dL (ref 30.0–36.0)
MCV: 90.6 fL (ref 80.0–100.0)
Monocytes Absolute: 0.7 10*3/uL (ref 0.1–1.0)
Monocytes Relative: 14 %
Neutro Abs: 2.5 10*3/uL (ref 1.7–7.7)
Neutrophils Relative %: 50 %
Platelets: 249 10*3/uL (ref 150–400)
RBC: 3.4 MIL/uL — ABNORMAL LOW (ref 3.87–5.11)
RDW: 21.3 % — ABNORMAL HIGH (ref 11.5–15.5)
WBC: 5 10*3/uL (ref 4.0–10.5)
nRBC: 0 % (ref 0.0–0.2)

## 2023-03-05 LAB — COMPREHENSIVE METABOLIC PANEL
ALT: 14 U/L (ref 0–44)
AST: 24 U/L (ref 15–41)
Albumin: 3.7 g/dL (ref 3.5–5.0)
Alkaline Phosphatase: 56 U/L (ref 38–126)
Anion gap: 15 (ref 5–15)
BUN: 27 mg/dL — ABNORMAL HIGH (ref 8–23)
CO2: 26 mmol/L (ref 22–32)
Calcium: 8.5 mg/dL — ABNORMAL LOW (ref 8.9–10.3)
Chloride: 99 mmol/L (ref 98–111)
Creatinine, Ser: 6.27 mg/dL — ABNORMAL HIGH (ref 0.44–1.00)
GFR, Estimated: 7 mL/min — ABNORMAL LOW (ref >60)
Glucose, Bld: 85 mg/dL (ref 70–99)
Potassium: 4.3 mmol/L (ref 3.5–5.1)
Sodium: 140 mmol/L (ref 135–145)
Total Bilirubin: 0.6 mg/dL (ref 0.3–1.2)
Total Protein: 7.1 g/dL (ref 6.5–8.1)

## 2023-03-05 LAB — LIPASE, BLOOD: Lipase: 117 U/L — ABNORMAL HIGH (ref 11–51)

## 2023-03-05 LAB — POC OCCULT BLOOD, ED: Fecal Occult Bld: POSITIVE — AB

## 2023-03-05 MED ORDER — ONDANSETRON HCL 4 MG/2ML IJ SOLN
4.0000 mg | Freq: Once | INTRAMUSCULAR | Status: AC
  Administered 2023-03-05: 4 mg via INTRAVENOUS
  Filled 2023-03-05: qty 2

## 2023-03-05 MED ORDER — MORPHINE SULFATE (PF) 2 MG/ML IV SOLN
2.0000 mg | Freq: Once | INTRAVENOUS | Status: AC
  Administered 2023-03-05: 2 mg via INTRAVENOUS
  Filled 2023-03-05: qty 1

## 2023-03-05 MED ORDER — PANTOPRAZOLE SODIUM 40 MG PO TBEC: 40.0000 mg | DELAYED_RELEASE_TABLET | Freq: Every day | ORAL | 2 refills | Status: DC

## 2023-03-05 MED ORDER — PANTOPRAZOLE SODIUM 40 MG IV SOLR
40.0000 mg | Freq: Once | INTRAVENOUS | Status: AC
  Administered 2023-03-05: 40 mg via INTRAVENOUS
  Filled 2023-03-05: qty 10

## 2023-03-05 MED ORDER — SODIUM CHLORIDE 0.9 % IV BOLUS
500.0000 mL | Freq: Once | INTRAVENOUS | Status: AC
  Administered 2023-03-05: 500 mL via INTRAVENOUS

## 2023-03-05 MED ORDER — MORPHINE SULFATE (PF) 4 MG/ML IV SOLN
4.0000 mg | Freq: Once | INTRAVENOUS | Status: AC
  Administered 2023-03-05: 4 mg via INTRAVENOUS
  Filled 2023-03-05: qty 1

## 2023-03-05 NOTE — Discharge Instructions (Addendum)
You were seen in the emergency room today for blood in stool and abdominal pain.  I have prescribed pantoprazole, please take daily for findings concerning for gastritis on your CT.  Please follow up with primary care for blood stool, as well as, findings of possible gastritis on CT scan.   Please return to the emergency room if you have any new or worsening symptoms.

## 2023-03-05 NOTE — ED Notes (Signed)
Pt goes to HD MWF. Pt doesn't produce urine

## 2023-03-05 NOTE — ED Notes (Signed)
Pt is having abdominal pain that started a couple of days ago. The pt says it has been constant. Pt last BM was today and she noticed dark stools. Pt says the stool  started solid but ended watery.  Pt states she is having 8/10 pain. RN notified provider for medication.  Pt is a hard stick. [place] was attempted for IV placed but unsuccessful.

## 2023-03-05 NOTE — ED Notes (Signed)
Pt tolerating PO intake

## 2023-03-05 NOTE — ED Triage Notes (Signed)
Pt has had black loose stools for 3 days. Her PCP advised her to come to the ED. No NV. Pt has been feeling weaker than usual.

## 2023-03-05 NOTE — ED Provider Notes (Signed)
[place] [facility]ERGENCY DEPARTMENT AT [facility] Provider Note   CSN: [id number] Arrival date & time: 03/05/23  1212     History  Chief Complaint  Patient presents with   Blood In Stools    Ashley [name] is a 72 y.o. female past medical history of hyperlipidemia, hypertension, hepatitis C, stroke, end-stage renal disease on dialysis, pancreatitis, GI bleed, presenting to the emergency room with reported 3 days of loose black stools.  She was seen by her primary care today who recommend that she come here.  Patient does have severe pain in generalized abdominal pain, started 3 days ago.  Patient has not tried anything for symptoms.  Reports she has been tolerating p.o. intake.  Has not missed any dialysis.  Denies any nausea or vomiting. Denies recent trauma or injury. Pt is not on blood thinner.   HPI     Home Medications Prior to Admission medications   Medication Sig Start Date End Date Taking? Authorizing Provider  amLODipine (NORVASC) 10 MG tablet Take 1 tablet (10 mg total) by mouth daily. Patient taking differently: Take 10 mg by mouth daily at 12 noon. 11/11/16   Pincus Large, DO  atorvastatin (LIPITOR) 20 MG tablet Take 20 mg by mouth at bedtime. 08/05/20   [provider]  B Complex-C-Zn-Folic Acid (DIALYVITE 800 WITH ZINC) 0.8 MG TABS Take 1 tablet by mouth daily.  07/07/19   [provider]  cinacalcet (SENSIPAR) 60 MG tablet Take 60 mg by mouth every evening. 03/23/21   [provider]  hydrALAZINE (APRESOLINE) 25 MG tablet Take 3 tablets (75 mg total) by mouth every 8 (eight) hours. 11/14/20 09/04/22  [place], Eric J, DO  HYDROcodone-acetaminophen (NORCO/VICODIN) 5-325 MG tablet Take 1 tablet by mouth every 6 (six) hours as needed for severe pain or moderate pain. 10/23/22   Rolly Salter, MD  lidocaine-prilocaine (EMLA) cream Apply 1 application topically See admin instructions. Apply small amount to access site on Tuesday, Thursday,  Saturday one hour before dialysis. Cover with occlusive dressing (saran wrap) Patient not taking: Reported on 09/04/2022 08/25/19   [provider]  lipase/protease/amylase (CREON) 36000 UNITS CPEP capsule Take 1 capsule (36,000 Units total) by mouth 3 (three) times daily before meals. 10/23/22   Rolly Salter, MD  mirtazapine (REMERON) 7.5 MG tablet Take 7.5 mg by mouth at bedtime.    [provider]  ondansetron (ZOFRAN ODT) 4 MG disintegrating tablet Take 1 tablet (4 mg total) by mouth every 8 (eight) hours as needed for nausea or vomiting. 04/10/21   Pricilla Loveless, MD  pantoprazole (PROTONIX) 40 MG tablet Take 1 tablet (40 mg total) by mouth daily. 10/23/22 01/21/23  Rolly Salter, MD  senna-docusate (SENOKOT-S) 8.6-50 MG tablet Take 1 tablet by mouth at bedtime as needed for mild constipation. 04/16/21   Regalado, Belkys A, MD  sevelamer carbonate (RENVELA) 800 MG tablet Take 800 mg by mouth 3 (three) times daily with meals. 07/23/20   [provider]      Allergies    Aspirin and Ibuprofen    Review of Systems   Review of Systems  Gastrointestinal:  Positive for abdominal pain and blood in stool.    Physical Exam Updated Vital Signs BP (!) 157/73   Pulse 83   Temp 99.3 F (37.4 C)   Resp 13   Ht 5\' 2"  (1.575 m)   Wt 47.2 kg   SpO2 99%   BMI 19.02 kg/m  Physical Exam  Vitals and nursing note reviewed.  Constitutional:      General: She is not in acute distress.    Appearance: She is not ill-appearing, toxic-appearing or diaphoretic.  HENT:     Head: Normocephalic and atraumatic.     Nose: No congestion or rhinorrhea.  Eyes:     General: No scleral icterus.    Conjunctiva/sclera: Conjunctivae normal.  Cardiovascular:     Rate and Rhythm: Normal rate and regular rhythm.     Pulses: Normal pulses.     Heart sounds: Murmur heard.  Pulmonary:     Effort: Pulmonary effort is normal. No respiratory distress.     Breath sounds: Normal breath sounds.  No stridor. No wheezing or rales.  Abdominal:     General: Abdomen is flat. Bowel sounds are normal. There is no distension.     Palpations: Abdomen is soft. There is no mass.     Tenderness: There is abdominal tenderness.     Hernia: No hernia is present.     Comments: Newness to palpation in periumbilical and left lower quadrant. Rectal exam shows brown stool, no melena or obvious bright red blood  Musculoskeletal:     Right lower leg: No edema.     Left lower leg: No edema.  Skin:    General: Skin is warm and dry.     Capillary Refill: Capillary refill takes less than 2 seconds.     Findings: No lesion.  Neurological:     General: No focal deficit present.     Mental Status: She is alert and oriented to person, place, and time. Mental status is at baseline.     Cranial Nerves: No cranial nerve deficit.     Sensory: No sensory deficit.     Motor: No weakness.     Gait: Gait normal.     ED Results / Procedures / Treatments   Labs (all labs ordered are listed, but only abnormal results are displayed) Labs Reviewed  COMPREHENSIVE METABOLIC PANEL - Abnormal; Notable for the following components:      Result Value   BUN 27 (*)    Creatinine, Ser 6.27 (*)    Calcium 8.5 (*)    GFR, Estimated 7 (*)    All other components within normal limits  LIPASE, BLOOD - Abnormal; Notable for the following components:   Lipase 117 (*)    All other components within normal limits  CBC WITH DIFFERENTIAL/PLATELET - Abnormal; Notable for the following components:   RBC 3.40 (*)    Hemoglobin 9.8 (*)    HCT 30.8 (*)    RDW 21.3 (*)    All other components within normal limits  POC OCCULT BLOOD, ED - Abnormal; Notable for the following components:   Fecal Occult Bld POSITIVE (*)    All other components within normal limits    EKG None  Radiology No results found.  Procedures Procedures    Medications Ordered in ED Medications - No data to display  ED Course/ Medical Decision  Making/ A&P                                 Medical Decision Making Amount and/or Complexity of Data Reviewed Labs: ordered. Radiology: ordered.  Risk Prescription drug management.   Ashley [name] 72 y.o. presented today for abd pain. Working DDx includes, but not limited to, gastroenteritis, colitis, SBO, appendicitis, cholecystitis, hepatobiliary pathology, gastritis, PUD, ACS, dissection,  pancreatitis, nephrolithiasis, AAA, UTI, pyelonephritis, ruptured ectopic pregnancy, PID, ovarian  R/o DDx: These are considered less likely than current impression due to history of present illness, physical exam, labs/imaging findings.  Review of prior external notes: 5/16 hospitalization for GI bleed  Pmhx: AV malformation of small bowel, anemia due to chronic blood loss, GI bleed end-stage renal disease on dialysis, hypertension, gastroenteritis  Unique Tests and My Interpretation:  CBC with differential: Blood cell 5, hemoglobin 9.8 - hemoglobin today improved from 85m ago CMP: BUN 27, Cr 6.17 patient is on hemodialysis reports she has not missed any dialysis.  Potassium within normal limits. Lipase: 117 -patient has history of chronic pancreatitis Hemoccult: Positive - stool was brown, no gross bleeding or obvious melena  UA: Not ordered, patient does not make urine     Imaging:  CT Abd/Pelvis: evaluate for structural/surgical etiology of patients' severe abdominal pain.  CT abdomen pelvis shows  indistinct margins of the pancreas suggesting possible pancreatitis, and possible findings of gastritis.    Problem List / ED Course / Critical interventions / Medication management  Patient reporting to the emergency room with abdominal pain and blood in stool.  Patient was recently admitted to the hospital for acute GI bleed and was found to have AV malformation of the small intestines.  Patient was seen by primary care and encouraged to come here.  Rectal exam showed no obvious melena or  gross bleeding, Hemoccult was positive.  CT scan shows possible pancreatitis and possible gastritis.  Patient's pain was controlled here with morphine and pantoprazole.  Patient felt better after fluids as well.   Prior to discharge, patient is tolerating p.o. intake and pain is under control.  Patient feels comfortable going home with close follow-up for today's findings. Patient is on dialysis and has not missed any appointments, scheduled to have dialysis tomorrow.  Encourage patient to take Tylenol as needed for abdominal pain and return to the emergency room if pain continues or worsens.  I ordered medication including Morphine, NS  for pain   Reevaluation of the patient after these medicines showed that the patient improved Patients vitals assessed. Upon arrival patient is hemodynamically stable.  I have reviewed the patients home medicines and have made adjustments as needed    Consult: None   Plan: Sent pantoprazole to the pharmacy, please take as prescribed.  Please return to the emergency room if pain returns or you are no longer tolerating PO, new or worsening symptoms.  Patient is stable for discharge.  Patient understands diagnosis and agrees to plan.  Patient reports close follow-up with primary care and will call them tomorrow to schedule an appointment to ensure resolution of today's symptoms.         Final Clinical Impression(s) / ED Diagnoses Final diagnoses:  None    Rx / DC Orders ED Discharge Orders     None         Smitty Knudsen, PA-C 03/05/23 2132    Rolan Bucco, MD 03/06/23 0830

## 2023-03-10 ENCOUNTER — Emergency Department (HOSPITAL_COMMUNITY): Payer: 59

## 2023-03-10 ENCOUNTER — Other Ambulatory Visit: Payer: Self-pay

## 2023-03-10 ENCOUNTER — Encounter (HOSPITAL_COMMUNITY): Payer: Self-pay

## 2023-03-10 ENCOUNTER — Inpatient Hospital Stay (HOSPITAL_COMMUNITY)
Admission: EM | Admit: 2023-03-10 | Discharge: 2023-03-14 | DRG: 377 | Disposition: A | Discharge status: Home / Self Care | Payer: 59 | Attending: Internal Medicine | Admitting: Internal Medicine

## 2023-03-10 DIAGNOSIS — K3189 Other diseases of stomach and duodenum: Secondary | ICD-10-CM | POA: Diagnosis present

## 2023-03-10 DIAGNOSIS — E785 Hyperlipidemia, unspecified: Secondary | ICD-10-CM | POA: Diagnosis present

## 2023-03-10 DIAGNOSIS — I509 Heart failure, unspecified: Secondary | ICD-10-CM | POA: Diagnosis not present

## 2023-03-10 DIAGNOSIS — D124 Benign neoplasm of descending colon: Secondary | ICD-10-CM | POA: Diagnosis present

## 2023-03-10 DIAGNOSIS — Z8616 Personal history of COVID-19: Secondary | ICD-10-CM

## 2023-03-10 DIAGNOSIS — Z992 Dependence on renal dialysis: Secondary | ICD-10-CM | POA: Diagnosis not present

## 2023-03-10 DIAGNOSIS — K861 Other chronic pancreatitis: Secondary | ICD-10-CM | POA: Diagnosis present

## 2023-03-10 DIAGNOSIS — G629 Polyneuropathy, unspecified: Secondary | ICD-10-CM | POA: Diagnosis present

## 2023-03-10 DIAGNOSIS — K921 Melena: Secondary | ICD-10-CM | POA: Diagnosis present

## 2023-03-10 DIAGNOSIS — K219 Gastro-esophageal reflux disease without esophagitis: Secondary | ICD-10-CM | POA: Diagnosis present

## 2023-03-10 DIAGNOSIS — Z803 Family history of malignant neoplasm of breast: Secondary | ICD-10-CM

## 2023-03-10 DIAGNOSIS — M5412 Radiculopathy, cervical region: Secondary | ICD-10-CM | POA: Diagnosis present

## 2023-03-10 DIAGNOSIS — D62 Acute posthemorrhagic anemia: Secondary | ICD-10-CM | POA: Diagnosis present

## 2023-03-10 DIAGNOSIS — D122 Benign neoplasm of ascending colon: Secondary | ICD-10-CM | POA: Diagnosis present

## 2023-03-10 DIAGNOSIS — D125 Benign neoplasm of sigmoid colon: Secondary | ICD-10-CM | POA: Diagnosis present

## 2023-03-10 DIAGNOSIS — F17210 Nicotine dependence, cigarettes, uncomplicated: Secondary | ICD-10-CM | POA: Diagnosis present

## 2023-03-10 DIAGNOSIS — I120 Hypertensive chronic kidney disease with stage 5 chronic kidney disease or end stage renal disease: Secondary | ICD-10-CM | POA: Diagnosis present

## 2023-03-10 DIAGNOSIS — D126 Benign neoplasm of colon, unspecified: Secondary | ICD-10-CM | POA: Diagnosis not present

## 2023-03-10 DIAGNOSIS — K625 Hemorrhage of anus and rectum: Secondary | ICD-10-CM

## 2023-03-10 DIAGNOSIS — Z8673 Personal history of transient ischemic attack (TIA), and cerebral infarction without residual deficits: Secondary | ICD-10-CM

## 2023-03-10 DIAGNOSIS — M898X9 Other specified disorders of bone, unspecified site: Secondary | ICD-10-CM | POA: Diagnosis present

## 2023-03-10 DIAGNOSIS — K317 Polyp of stomach and duodenum: Secondary | ICD-10-CM | POA: Diagnosis not present

## 2023-03-10 DIAGNOSIS — Z8619 Personal history of other infectious and parasitic diseases: Secondary | ICD-10-CM

## 2023-03-10 DIAGNOSIS — N186 End stage renal disease: Secondary | ICD-10-CM | POA: Diagnosis present

## 2023-03-10 DIAGNOSIS — J449 Chronic obstructive pulmonary disease, unspecified: Secondary | ICD-10-CM | POA: Diagnosis present

## 2023-03-10 DIAGNOSIS — D509 Iron deficiency anemia, unspecified: Secondary | ICD-10-CM | POA: Diagnosis not present

## 2023-03-10 DIAGNOSIS — D631 Anemia in chronic kidney disease: Secondary | ICD-10-CM | POA: Diagnosis present

## 2023-03-10 DIAGNOSIS — N189 Chronic kidney disease, unspecified: Secondary | ICD-10-CM | POA: Diagnosis not present

## 2023-03-10 DIAGNOSIS — K640 First degree hemorrhoids: Secondary | ICD-10-CM

## 2023-03-10 DIAGNOSIS — R34 Anuria and oliguria: Secondary | ICD-10-CM | POA: Diagnosis present

## 2023-03-10 DIAGNOSIS — K2970 Gastritis, unspecified, without bleeding: Secondary | ICD-10-CM | POA: Diagnosis present

## 2023-03-10 DIAGNOSIS — K922 Gastrointestinal hemorrhage, unspecified: Secondary | ICD-10-CM | POA: Diagnosis not present

## 2023-03-10 DIAGNOSIS — R0603 Acute respiratory distress: Secondary | ICD-10-CM | POA: Diagnosis present

## 2023-03-10 DIAGNOSIS — Z90710 Acquired absence of both cervix and uterus: Secondary | ICD-10-CM

## 2023-03-10 DIAGNOSIS — Z83438 Family history of other disorder of lipoprotein metabolism and other lipidemia: Secondary | ICD-10-CM

## 2023-03-10 DIAGNOSIS — Z886 Allergy status to analgesic agent status: Secondary | ICD-10-CM

## 2023-03-10 DIAGNOSIS — D649 Anemia, unspecified: Secondary | ICD-10-CM | POA: Diagnosis not present

## 2023-03-10 DIAGNOSIS — D500 Iron deficiency anemia secondary to blood loss (chronic): Secondary | ICD-10-CM | POA: Diagnosis not present

## 2023-03-10 DIAGNOSIS — Z8719 Personal history of other diseases of the digestive system: Secondary | ICD-10-CM

## 2023-03-10 DIAGNOSIS — K8681 Exocrine pancreatic insufficiency: Secondary | ICD-10-CM | POA: Diagnosis present

## 2023-03-10 DIAGNOSIS — Z79899 Other long term (current) drug therapy: Secondary | ICD-10-CM

## 2023-03-10 DIAGNOSIS — R1084 Generalized abdominal pain: Secondary | ICD-10-CM

## 2023-03-10 DIAGNOSIS — Z8249 Family history of ischemic heart disease and other diseases of the circulatory system: Secondary | ICD-10-CM

## 2023-03-10 DIAGNOSIS — Z91158 Patient's noncompliance with renal dialysis for other reason: Secondary | ICD-10-CM

## 2023-03-10 DIAGNOSIS — I130 Hypertensive heart and chronic kidney disease with heart failure and stage 1 through stage 4 chronic kidney disease, or unspecified chronic kidney disease: Secondary | ICD-10-CM | POA: Diagnosis not present

## 2023-03-10 DIAGNOSIS — K2950 Unspecified chronic gastritis without bleeding: Secondary | ICD-10-CM | POA: Diagnosis not present

## 2023-03-10 DIAGNOSIS — Z8711 Personal history of peptic ulcer disease: Secondary | ICD-10-CM

## 2023-03-10 LAB — COMPREHENSIVE METABOLIC PANEL
ALT: 12 U/L (ref 0–44)
AST: 23 U/L (ref 15–41)
Albumin: 3.1 g/dL — ABNORMAL LOW (ref 3.5–5.0)
Alkaline Phosphatase: 52 U/L (ref 38–126)
Anion gap: 18 — ABNORMAL HIGH (ref 5–15)
BUN: 25 mg/dL — ABNORMAL HIGH (ref 8–23)
CO2: 28 mmol/L (ref 22–32)
Calcium: 7.7 mg/dL — ABNORMAL LOW (ref 8.9–10.3)
Chloride: 96 mmol/L — ABNORMAL LOW (ref 98–111)
Creatinine, Ser: 7.45 mg/dL — ABNORMAL HIGH (ref 0.44–1.00)
GFR, Estimated: 5 mL/min — ABNORMAL LOW (ref >60)
Glucose, Bld: 89 mg/dL (ref 70–99)
Potassium: 4.3 mmol/L (ref 3.5–5.1)
Sodium: 142 mmol/L (ref 135–145)
Total Bilirubin: 0.4 mg/dL (ref 0.3–1.2)
Total Protein: 6.5 g/dL (ref 6.5–8.1)

## 2023-03-10 LAB — CBC WITH DIFFERENTIAL/PLATELET
Abs Immature Granulocytes: 0.02 10*3/uL (ref 0.00–0.07)
Basophils Absolute: 0.1 10*3/uL (ref 0.0–0.1)
Basophils Relative: 1 %
Eosinophils Absolute: 0.2 10*3/uL (ref 0.0–0.5)
Eosinophils Relative: 4 %
HCT: 24.2 % — ABNORMAL LOW (ref 36.0–46.0)
Hemoglobin: 7.7 g/dL — ABNORMAL LOW (ref 12.0–15.0)
Immature Granulocytes: 0 %
Lymphocytes Relative: 18 %
Lymphs Abs: 1 10*3/uL (ref 0.7–4.0)
MCH: 28.4 pg (ref 26.0–34.0)
MCHC: 31.8 g/dL (ref 30.0–36.0)
MCV: 89.3 fL (ref 80.0–100.0)
Monocytes Absolute: 0.8 10*3/uL (ref 0.1–1.0)
Monocytes Relative: 14 %
Neutro Abs: 3.4 10*3/uL (ref 1.7–7.7)
Neutrophils Relative %: 63 %
Platelets: 290 10*3/uL (ref 150–400)
RBC: 2.71 MIL/uL — ABNORMAL LOW (ref 3.87–5.11)
RDW: 20.9 % — ABNORMAL HIGH (ref 11.5–15.5)
WBC: 5.4 10*3/uL (ref 4.0–10.5)
nRBC: 0 % (ref 0.0–0.2)

## 2023-03-10 LAB — POC OCCULT BLOOD, ED: Fecal Occult Bld: POSITIVE — AB

## 2023-03-10 LAB — HEPATITIS B SURFACE ANTIGEN: Hepatitis B Surface Ag: NONREACTIVE

## 2023-03-10 LAB — PHOSPHORUS: Phosphorus: 3.9 mg/dL (ref 2.5–4.6)

## 2023-03-10 LAB — PROTIME-INR
INR: 1.1 (ref 0.8–1.2)
Prothrombin Time: 14.5 s (ref 11.4–15.2)

## 2023-03-10 LAB — LIPASE, BLOOD: Lipase: 52 U/L — ABNORMAL HIGH (ref 11–51)

## 2023-03-10 MED ORDER — ONDANSETRON HCL 4 MG/2ML IJ SOLN: 4.0000 mg | Freq: Four times a day (QID) | INTRAMUSCULAR | Status: DC | PRN

## 2023-03-10 MED ORDER — AMLODIPINE BESYLATE 10 MG PO TABS
10.0000 mg | ORAL_TABLET | Freq: Every day | ORAL | Status: DC
  Administered 2023-03-10 – 2023-03-14 (×5): 10 mg via ORAL
  Filled 2023-03-10 (×5): qty 1

## 2023-03-10 MED ORDER — PEG-KCL-NACL-NASULF-NA ASC-C 100 G PO SOLR
0.5000 | Freq: Once | ORAL | Status: AC
  Administered 2023-03-10: 100 g via ORAL
  Filled 2023-03-10: qty 1

## 2023-03-10 MED ORDER — GABAPENTIN 100 MG PO CAPS
200.0000 mg | ORAL_CAPSULE | Freq: Every day | ORAL | Status: DC
  Administered 2023-03-10 – 2023-03-13 (×4): 200 mg via ORAL
  Filled 2023-03-10 (×4): qty 2

## 2023-03-10 MED ORDER — HYDRALAZINE HCL 20 MG/ML IJ SOLN: 10.0000 mg | Freq: Four times a day (QID) | INTRAMUSCULAR | Status: DC | PRN

## 2023-03-10 MED ORDER — ONDANSETRON HCL 4 MG/2ML IJ SOLN
4.0000 mg | Freq: Once | INTRAMUSCULAR | Status: AC
  Administered 2023-03-10: 4 mg via INTRAVENOUS
  Filled 2023-03-10: qty 2

## 2023-03-10 MED ORDER — ATORVASTATIN CALCIUM 10 MG PO TABS
20.0000 mg | ORAL_TABLET | Freq: Every day | ORAL | Status: DC
  Administered 2023-03-10 – 2023-03-13 (×4): 20 mg via ORAL
  Filled 2023-03-10 (×4): qty 2

## 2023-03-10 MED ORDER — PANTOPRAZOLE 80MG IVPB - SIMPLE MED
80.0000 mg | Freq: Once | INTRAVENOUS | Status: AC
  Administered 2023-03-10: 80 mg via INTRAVENOUS
  Filled 2023-03-10 (×2): qty 100

## 2023-03-10 MED ORDER — DARBEPOETIN ALFA 60 MCG/0.3ML IJ SOSY
60.0000 ug | PREFILLED_SYRINGE | INTRAMUSCULAR | Status: DC
  Administered 2023-03-10: 60 ug via SUBCUTANEOUS
  Filled 2023-03-10: qty 0.3

## 2023-03-10 MED ORDER — ACETAMINOPHEN 325 MG PO TABS
650.0000 mg | ORAL_TABLET | Freq: Four times a day (QID) | ORAL | Status: DC | PRN
  Filled 2023-03-10: qty 2

## 2023-03-10 MED ORDER — CINACALCET HCL 30 MG PO TABS
60.0000 mg | ORAL_TABLET | Freq: Every day | ORAL | Status: DC
  Administered 2023-03-10 – 2023-03-12 (×3): 60 mg via ORAL
  Filled 2023-03-10 (×5): qty 2

## 2023-03-10 MED ORDER — HYDROMORPHONE HCL 1 MG/ML IJ SOLN
0.5000 mg | INTRAMUSCULAR | Status: DC | PRN
  Administered 2023-03-10 – 2023-03-13 (×10): 0.5 mg via INTRAVENOUS
  Filled 2023-03-10 (×13): qty 0.5

## 2023-03-10 MED ORDER — FENTANYL CITRATE PF 50 MCG/ML IJ SOSY
12.5000 ug | PREFILLED_SYRINGE | Freq: Once | INTRAMUSCULAR | Status: AC
  Administered 2023-03-10: 12.5 ug via INTRAVENOUS
  Filled 2023-03-10: qty 1

## 2023-03-10 MED ORDER — CINACALCET HCL 30 MG PO TABS
60.0000 mg | ORAL_TABLET | Freq: Every evening | ORAL | Status: DC
  Filled 2023-03-10: qty 2

## 2023-03-10 MED ORDER — ACETAMINOPHEN 650 MG RE SUPP: 650.0000 mg | Freq: Four times a day (QID) | RECTAL | Status: DC | PRN

## 2023-03-10 MED ORDER — PANTOPRAZOLE SODIUM 40 MG PO TBEC
40.0000 mg | DELAYED_RELEASE_TABLET | Freq: Every day | ORAL | Status: DC
  Administered 2023-03-10 – 2023-03-14 (×5): 40 mg via ORAL
  Filled 2023-03-10 (×5): qty 1

## 2023-03-10 MED ORDER — MORPHINE SULFATE (PF) 4 MG/ML IV SOLN
4.0000 mg | Freq: Once | INTRAVENOUS | Status: AC
  Administered 2023-03-10: 4 mg via INTRAVENOUS
  Filled 2023-03-10: qty 1

## 2023-03-10 MED ORDER — SEVELAMER CARBONATE 800 MG PO TABS
800.0000 mg | ORAL_TABLET | Freq: Three times a day (TID) | ORAL | Status: DC
  Administered 2023-03-11 – 2023-03-14 (×7): 800 mg via ORAL
  Filled 2023-03-10 (×9): qty 1

## 2023-03-10 MED ORDER — ALBUTEROL SULFATE (2.5 MG/3ML) 0.083% IN NEBU: 2.5000 mg | INHALATION_SOLUTION | Freq: Four times a day (QID) | RESPIRATORY_TRACT | Status: DC

## 2023-03-10 MED ORDER — DOXERCALCIFEROL 4 MCG/2ML IV SOLN
3.0000 ug | INTRAVENOUS | Status: DC
  Administered 2023-03-11: 3 ug via INTRAVENOUS
  Filled 2023-03-10 (×3): qty 2

## 2023-03-10 MED ORDER — ONDANSETRON HCL 4 MG PO TABS: 4.0000 mg | ORAL_TABLET | Freq: Four times a day (QID) | ORAL | Status: DC | PRN

## 2023-03-10 MED ORDER — CHLORHEXIDINE GLUCONATE CLOTH 2 % EX PADS
6.0000 | MEDICATED_PAD | Freq: Every day | CUTANEOUS | Status: DC
  Administered 2023-03-12: 6 via TOPICAL

## 2023-03-10 MED ORDER — ALBUTEROL SULFATE (2.5 MG/3ML) 0.083% IN NEBU: 2.5000 mg | INHALATION_SOLUTION | Freq: Four times a day (QID) | RESPIRATORY_TRACT | Status: DC | PRN

## 2023-03-10 MED ORDER — OMEPRAZOLE 2 MG/ML ORAL SUSPENSION: 80.0000 mg | Freq: Once | ORAL | Status: DC

## 2023-03-10 NOTE — ED Provider Notes (Signed)
[place] EMERGENCY DEPARTMENT AT [facility] Provider Note   CSN: [id number] Arrival date & time: 03/10/23  0254     History Chief Complaint  Patient presents with  . Abdominal Pain    rect  . Rectal Bleeding    Ashley [name] is a 72 y.o. female.  Patient past history significant for hypertension, peptic ulcer disease, dyslipidemia, ESRD on hemodialysis, pancreatitis presents emergency department concerns of abdominal pain and rectal bleeding.  Reports that she was seen here 03/05/2023 for similar symptoms without notable improvement states that the rectal bleeding has become more persistent and severe since then.  Patient denies currently being on any blood thinner.  Endorses some pain in the rectum with bowel movements.  Unclear history of hemorrhoids.  Patient is anuric.   Abdominal Pain Associated symptoms: hematochezia   Rectal Bleeding Associated symptoms: abdominal pain        Home Medications Prior to Admission medications   Medication Sig Start Date End Date Taking? Authorizing Provider  amLODipine (NORVASC) 10 MG tablet Take 1 tablet (10 mg total) by mouth daily. Patient taking differently: Take 10 mg by mouth daily at 12 noon. 11/11/16   Pincus Large, DO  atorvastatin (LIPITOR) 20 MG tablet Take 20 mg by mouth at bedtime. 08/05/20   [provider]  B Complex-C-Zn-Folic Acid (DIALYVITE 800 WITH ZINC) 0.8 MG TABS Take 1 tablet by mouth daily.  07/07/19   [provider]  cinacalcet (SENSIPAR) 60 MG tablet Take 60 mg by mouth every evening. 03/23/21   [provider]  hydrALAZINE (APRESOLINE) 25 MG tablet Take 3 tablets (75 mg total) by mouth every 8 (eight) hours. 11/14/20 09/04/22  [place], Eric J, DO  HYDROcodone-acetaminophen (NORCO/VICODIN) 5-325 MG tablet Take 1 tablet by mouth every 6 (six) hours as needed for severe pain or moderate pain. 10/23/22   Rolly Salter, MD  lidocaine-prilocaine (EMLA) cream Apply 1 application  topically See admin instructions. Apply small amount to access site on Tuesday, Thursday, Saturday one hour before dialysis. Cover with occlusive dressing (saran wrap) Patient not taking: Reported on 09/04/2022 08/25/19   [provider]  lipase/protease/amylase (CREON) 36000 UNITS CPEP capsule Take 1 capsule (36,000 Units total) by mouth 3 (three) times daily before meals. 10/23/22   Rolly Salter, MD  mirtazapine (REMERON) 7.5 MG tablet Take 7.5 mg by mouth at bedtime.    [provider]  ondansetron (ZOFRAN ODT) 4 MG disintegrating tablet Take 1 tablet (4 mg total) by mouth every 8 (eight) hours as needed for nausea or vomiting. 04/10/21   Pricilla Loveless, MD  pantoprazole (PROTONIX) 40 MG tablet Take 1 tablet (40 mg total) by mouth daily. 03/05/23 06/03/23  Barrett, Horald Chestnut, PA-C  senna-docusate (SENOKOT-S) 8.6-50 MG tablet Take 1 tablet by mouth at bedtime as needed for mild constipation. 04/16/21   Regalado, Belkys A, MD  sevelamer carbonate (RENVELA) 800 MG tablet Take 800 mg by mouth 3 (three) times daily with meals. 07/23/20   [provider]      Allergies    Aspirin and Ibuprofen    Review of Systems   Review of Systems  Gastrointestinal:  Positive for abdominal pain and hematochezia.  All other systems reviewed and are negative.   Physical Exam Updated Vital Signs BP (!) 141/55   Pulse 81   Temp 98.8 F (37.1 C) (Oral)   Resp 14   Ht 5\' 2"  (1.575 m)   Wt 47 kg   SpO2  100%   BMI 18.95 kg/m  Physical Exam Vitals and nursing note reviewed.  Constitutional:      General: She is not in acute distress.    Appearance: She is well-developed.  HENT:     Head: Normocephalic and atraumatic.  Eyes:     Conjunctiva/sclera: Conjunctivae normal.  Cardiovascular:     Rate and Rhythm: Normal rate and regular rhythm.     Heart sounds: No murmur heard. Pulmonary:     Effort: Pulmonary effort is normal. No respiratory distress.     Breath sounds: Normal  breath sounds.  Abdominal:     Palpations: Abdomen is soft.     Tenderness: There is generalized abdominal tenderness.  Genitourinary:    Rectum: Guaiac result positive.  Musculoskeletal:        General: No swelling.     Cervical back: Neck supple.  Skin:    General: Skin is warm and dry.     Capillary Refill: Capillary refill takes less than 2 seconds.  Neurological:     Mental Status: She is alert.  Psychiatric:        Mood and Affect: Mood normal.     ED Results / Procedures / Treatments   Labs (all labs ordered are listed, but only abnormal results are displayed) Labs Reviewed  CBC WITH DIFFERENTIAL/PLATELET - Abnormal; Notable for the following components:      Result Value   RBC 2.71 (*)    Hemoglobin 7.7 (*)    HCT 24.2 (*)    RDW 20.9 (*)    All other components within normal limits  COMPREHENSIVE METABOLIC PANEL - Abnormal; Notable for the following components:   Chloride 96 (*)    BUN 25 (*)    Creatinine, Ser 7.45 (*)    Calcium 7.7 (*)    Albumin 3.1 (*)    GFR, Estimated 5 (*)    Anion gap 18 (*)    All other components within normal limits  LIPASE, BLOOD - Abnormal; Notable for the following components:   Lipase 52 (*)    All other components within normal limits  POC OCCULT BLOOD, ED - Abnormal; Notable for the following components:   Fecal Occult Bld POSITIVE (*)    All other components within normal limits  PROTIME-INR  TYPE AND SCREEN    EKG None  Radiology CT ABDOMEN PELVIS WO CONTRAST  Result Date: 03/10/2023 CLINICAL DATA:  Acute abdominal pain with rectal bleeding EXAM: CT ABDOMEN AND PELVIS WITHOUT CONTRAST TECHNIQUE: Multidetector CT imaging of the abdomen and pelvis was performed following the standard protocol without IV contrast. RADIATION DOSE REDUCTION: This exam was performed according to the departmental dose-optimization program which includes automated exposure control, adjustment of the mA and/or kV according to patient size  and/or use of iterative reconstruction technique. COMPARISON:  03/05/2023 FINDINGS: Lower chest: Small pleural effusions with scarring or atelectasis at the lower lobes. Atheromatous plaque. Hepatobiliary: No focal liver abnormality.No evidence of biliary obstruction or stone. Pancreas: Unremarkable. Spleen: Unremarkable. Adrenals/Urinary Tract: Negative adrenals. No hydronephrosis or stone. Pronounced renal atrophy. Bilateral simple appearing renal cysts with no follow-up imaging recommended. Unremarkable bladder. Stomach/Bowel: There are a few fluid-filled small bowel loops in the lower abdomen and pelvis but no specific obstructive pattern or clear bowel wall thickening. No appendicitis. Hemoclip appearance at the peri-pyloric region. Vascular/Lymphatic: No acute vascular abnormality. Extensive atheromatous plaque affecting the aorta and iliacs. No mass or adenopathy. Reproductive:No acute finding Other: No ascites or pneumoperitoneum. Musculoskeletal: Degenerative changes without  acute finding. IMPRESSION: 1. No acute finding or specific cause for symptoms. 2. Small pleural effusions. 3. Extensive atherosclerosis. Electronically Signed   By: Tiburcio Pea M.D.   On: 03/10/2023 05:57    Procedures Procedures   Medications Ordered in ED Medications  morphine (PF) 4 MG/ML injection 4 mg (4 mg Intravenous Given 03/10/23 0408)  pantoprazole (PROTONIX) 80 mg /NS 100 mL IVPB (0 mg Intravenous Stopped 03/10/23 0500)  ondansetron (ZOFRAN) injection 4 mg (4 mg Intravenous Given 03/10/23 0416)  fentaNYL (SUBLIMAZE) injection 12.5 mcg (12.5 mcg Intravenous Given 03/10/23 6578)    ED Course/ Medical Decision Making/ A&P Clinical Course as of 03/10/23 0701  Tue Mar 10, 2023  0700 Spoke with Dr. Lazarus Salines, hospitalist, who will be admitting patient for suspected GI bleed. Advised NPO if GI wants to scope patient. [OZ]    Clinical Course User Index [OZ] Smitty Knudsen, PA-C                                Medical Decision Making Amount and/or Complexity of Data Reviewed Labs: ordered. Radiology: ordered.  Risk Prescription drug management. Decision regarding hospitalization.   This patient presents to the ED for concern of abdominal pain, rectal bleeding.  Differential diagnosis includes upper GI bleed, hemorrhoid, pancreatitis, bowel obstruction    Additional history obtained:  Additional history obtained from ED encounter from 03/05/2023 for concerns of abdominal pain and rectal bleeding.   Lab Tests:  I Ordered, and personally interpreted labs.  The pertinent results include: CBC with hemoglobin of 7.7 which is a notable drop from 9.8 about 5 days ago.  CMP shows poor renal function BUN at 25, creatinine at 7.45, and anion gap elevated at 18 as well as GFR down to 5, lipase slightly elevated at 52, Hemoccult positive   Imaging Studies ordered:  I ordered imaging studies including CT abdomen pelvis I independently visualized and interpreted imaging which showed no acute abdominal abnormality to explain current symptoms, small bilateral pleural effusions I agree with the radiologist interpretation   Medicines ordered and prescription drug management:  I ordered medication including pantoprazole, morphine, Zofran, fentanyl for GI bleed, pain, nausea Reevaluation of the patient after these medicines showed that the patient improved I have reviewed the patients home medicines and have made adjustments as needed   Problem List / ED Course:  Patient presents to the emergency department concerns of abdominal pain and rectal bleeding.  Past history significant for ESRD on hemodialysis, pancreatitis, peptic ulcer disease, dyslipidemia and hypertension.  Reports that she woke up this evening with an episode of bloody stool.  She said this been worsening and has shifted from melanotic in appearance to more bright red blood.  Also Dors is some pain in her rectum with bowel  movements. Patient's blood work shows baseline anemia slightly worse with a drop in hemoglobin from 9.8-7.7 in the last 5 days.  Patient denies any obvious hematemesis, or hematochezia.  However, fecal occult testing is positive for below.  Suspect patient's symptoms are continuation of prior GI bleed and likely accounts for drop in hemoglobin.  Given that patient is asymptomatic and hemoglobin remains above 7, transfusion not administered.  CMP shows poor renal function with creatinine at 7.45 and GFR down to an estimated 5.  Lipase slightly elevated at 52 but no signs of acute pancreatitis at this time.  CT imaging ordered for evaluation of pain. CT is negative for  any acute findings to explain patient's current symptoms.  Some small pleural effusions are noted.  Given patient's worsening hemoglobin and positive Hemoccult, will admit to medicine for suspected GI bleed.  Will also consult with gastroenterology. Secure chatted with Dr. Leone Payor, GI, regarding patient being admitted to medicine for suspected GI bleed. Spoke with Dr. Lazarus Salines, hospitalist, who will be admitting patient. Advised addition of a few labs and to keep NPO in case GI wants to scope patient this morning.   Final Clinical Impression(s) / ED Diagnoses Final diagnoses:  Acute GI bleeding  Rectal bleeding  Generalized abdominal pain    Rx / DC Orders ED Discharge Orders     None         Smitty Knudsen, PA-C 03/10/23 2951    Nira Conn, MD 03/10/23 [phone]

## 2023-03-10 NOTE — ED Notes (Signed)
Pt's O2 sats decreased to 87% on roomair after Morphine admin. Pt readjusted in bed and placed on 2L O2 via nasal cannula. Sats increased to 98%

## 2023-03-10 NOTE — ED Notes (Signed)
ED TO INPATIENT HANDOFF REPORT  ED Nurse Name and Phone #: Maralyn Sago [phone]  S Name/Age/Gender Ashley [name] 72 y.o. female Room/Bed: 031C/031C  Code Status   Code Status: Full Code  Home/SNF/Other Home Patient oriented to: self, place, time, and situation Is this baseline? Yes   Triage Complete: Triage complete  Chief Complaint Acute GI bleeding [K92.2]  Triage Note Patient C/O generalized ABD pain and has continued with rectal bleeding since 03/05/23, the bleeding has become more severe since then. She was seen her on 03/05/23   Allergies Allergies  Allergen Reactions   Aspirin Nausea And Vomiting    Stomach ache   Ibuprofen Nausea And Vomiting    Stomach ache    Level of Care/Admitting Diagnosis ED Disposition     ED Disposition  Admit   Condition  --   Comment  Hospital Area: [facility] [[id number]]  Level of Care: Telemetry Medical [104]  May admit patient to [facility] or [facility] if equivalent level of care is available:: Yes  Covid Evaluation: Asymptomatic - no recent exposure (last 10 days) testing not required  Diagnosis: Acute GI bleeding [295621]  Admitting Physician: Lorin Glass [[id number]]  Attending Physician: Lorin Glass [[id number]]  Certification:: I certify this patient will need inpatient services for at least 2 midnights  Expected Medical Readiness: 03/13/2023          B Medical/Surgery History Past Medical History:  Diagnosis Date   Acute pancreatitis 2000   2000, 12/2018, 08/2019   Arthritis    Cervical radiculopathy 02/28/2011   Cocaine substance abuse (HCC) 05/26/2013   positive UDS    Duodenitis    Erosive gastropathy    ESRD on hemodialysis (HCC)    TTS   GERD (gastroesophageal reflux disease)    Hepatitis C 1987   dt hx IVDA.  genotype 2B.  Epclusa started early 04/2020.     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  Chronic microvascular ischemic changes w parenchymal volume loss. Chronic white matter periventricular microhemorrhage, likely due to htn   Ulcer 1990   gastric ulcer. Ruptured s/p emergency repair   Past Surgical History:  Procedure Laterality Date   ABDOMINAL HYSTERECTOMY  1979   AV FISTULA PLACEMENT Left 06/16/2017   Procedure: ARTERIOVENOUS (AV) FISTULA CREATION LEFT ARM;  Surgeon: Fransisco Hertz, MD;  Location: [facility] OR;  Service: Vascular;  Laterality: Left;   BASCILIC VEIN TRANSPOSITION Left 10/02/2017   Procedure: BASILIC VEIN TRANSPOSITION SECOND STAGE LEFT ARM;  Surgeon: Larina Earthly, MD;  Location: [facility] OR;  Service: Vascular;  Laterality: Left;   BIOPSY  09/06/2019   Procedure: BIOPSY;  Surgeon: Iva Boop, MD;  Location: [facility] ENDOSCOPY;  Service: Endoscopy;;   ENTEROSCOPY N/A 10/22/2022   Procedure: ENTEROSCOPY;  Surgeon: Sherrilyn Rist, MD;  Location: [facility] ENDOSCOPY;  Service: Gastroenterology;  Laterality: N/A;   ESOPHAGOGASTRODUODENOSCOPY N/A 05/29/2013   Procedure: ESOPHAGOGASTRODUODENOSCOPY (EGD);  Surgeon: Beverley Fiedler, MD;  Location: [facility] ENDOSCOPY;  Service: Endoscopy;  Laterality: N/A;   ESOPHAGOGASTRODUODENOSCOPY  05/2013   for epigastric pain.  Nonobstructing Schatzki ring at GEJ, mild gastropathy, nonbleeding AVMs in bulb and D2. 5 mm sessile polyp in bulb.  ESOPHAGOGASTRODUODENOSCOPY (EGD) WITH PROPOFOL N/A 09/06/2019   Procedure: ESOPHAGOGASTRODUODENOSCOPY (EGD) WITH PROPOFOL;  Surgeon: Iva Boop, MD;  Location: [facility] ENDOSCOPY;  Service: Endoscopy;  Laterality: N/A;   ESOPHAGOGASTRODUODENOSCOPY (EGD) WITH PROPOFOL N/A 02/02/2020   Procedure: ESOPHAGOGASTRODUODENOSCOPY (EGD) WITH PROPOFOL;  Surgeon: Rachael Fee, MD;  Location: [facility] ENDOSCOPY;  Service: Endoscopy;  Laterality: N/A;    ESOPHAGOGASTRODUODENOSCOPY (EGD) WITH PROPOFOL N/A 02/07/2021   Procedure: ESOPHAGOGASTRODUODENOSCOPY (EGD) WITH PROPOFOL;  Surgeon: Rachael Fee, MD;  Location: [facility] ENDOSCOPY;  Service: Endoscopy;  Laterality: N/A;   EUS N/A 02/02/2020   Procedure: UPPER ENDOSCOPIC ULTRASOUND (EUS) RADIAL;  Surgeon: Rachael Fee, MD;  Location: [facility] ENDOSCOPY;  Service: Endoscopy;  Laterality: N/A;   EXCHANGE OF A DIALYSIS CATHETER Left 07/31/2017   Procedure: Removal  OF A  Right GroinTUNNELED  DIALYSIS CATHETER ,  Insertion of Left Femoral Dialysis Catheter.;  Surgeon: Larina Earthly, MD;  Location: [facility] OR;  Service: Vascular;  Laterality: Left;   HEMOSTASIS CLIP PLACEMENT  02/07/2021   Procedure: HEMOSTASIS CLIP PLACEMENT;  Surgeon: Rachael Fee, MD;  Location: [facility] ENDOSCOPY;  Service: Endoscopy;;   HEMOSTASIS CLIP PLACEMENT  10/22/2022   Procedure: HEMOSTASIS CLIP PLACEMENT;  Surgeon: Sherrilyn Rist, MD;  Location: [facility] ENDOSCOPY;  Service: Gastroenterology;;   HOT HEMOSTASIS N/A 09/06/2019   Procedure: HOT HEMOSTASIS (ARGON PLASMA COAGULATION/BICAP);  Surgeon: Iva Boop, MD;  Location: [facility] ENDOSCOPY;  Service: Endoscopy;  Laterality: N/A;   HOT HEMOSTASIS N/A 02/07/2021   Procedure: HOT HEMOSTASIS (ARGON PLASMA COAGULATION/BICAP);  Surgeon: Rachael Fee, MD;  Location: [facility] ENDOSCOPY;  Service: Endoscopy;  Laterality: N/A;   HOT HEMOSTASIS N/A 10/22/2022   Procedure: HOT HEMOSTASIS (ARGON PLASMA COAGULATION/BICAP);  Surgeon: Sherrilyn Rist, MD;  Location: [facility] ENDOSCOPY;  Service: Gastroenterology;  Laterality: N/A;   INSERTION OF DIALYSIS CATHETER Right 06/16/2017   Procedure: INSERTION OF DIALYSIS CATHETER;  Surgeon: Fransisco Hertz, MD;  Location: [facility] OR;  Service: Vascular;  Laterality: Right;   IR AV DIALY SHUNT INTRO NEEDLE/INTRACATH INITIAL W/PTA/IMG LEFT  06/21/2018   REPAIR OF PERFORATED ULCER  1990   gastric ulcer   SCLEROTHERAPY  10/22/2022   Procedure: SCLEROTHERAPY;  Surgeon: Sherrilyn Rist, MD;  Location: [facility] ENDOSCOPY;  Service: Gastroenterology;;   SUBMUCOSAL TATTOO INJECTION  10/22/2022   Procedure: SUBMUCOSAL TATTOO INJECTION;  Surgeon: Sherrilyn Rist, MD;  Location: [facility] ENDOSCOPY;  Service: Gastroenterology;;     A IV Location/Drains/Wounds Patient Lines/Drains/Airways Status     Active Line/Drains/Airways     Name Placement date Placement time Site Days   Peripheral IV 03/10/23 20 G Anterior;Right Forearm 03/10/23  0354  Forearm  less than 1   Fistula / Graft Left Upper arm Arteriovenous fistula --  --  Upper arm  --            Intake/Output Last 24 hours  Intake/Output Summary (Last 24 hours) at 03/10/2023 0829 Last data filed at 03/10/2023 0500 Gross per 24 hour  Intake 100 ml  Output --  Net 100 ml    Labs/Imaging Results for orders placed or performed during the hospital encounter of 03/10/23 (from the past 48 hour(s))  CBC with Differential     Status: Abnormal   Collection Time: 03/10/23  4:00 AM  Result Value Ref Range   WBC 5.4 4.0 - 10.5 K/uL   RBC 2.71 (L) 3.87 - 5.11 MIL/uL   Hemoglobin 7.7 (L) 12.0 - 15.0 g/dL   HCT 16.1 (  L) 36.0 - 46.0 %   MCV 89.3 80.0 - 100.0 fL   MCH 28.4 26.0 - 34.0 pg   MCHC 31.8 30.0 - 36.0 g/dL   RDW 47.8 (H) 29.5 - 62.1 %   Platelets 290 150 - 400 K/uL   nRBC 0.0 0.0 - 0.2 %   Neutrophils Relative % 63 %   Neutro Abs 3.4 1.7 - 7.7 K/uL   Lymphocytes Relative 18 %   Lymphs Abs 1.0 0.7 - 4.0 K/uL   Monocytes Relative 14 %   Monocytes Absolute 0.8 0.1 - 1.0 K/uL   Eosinophils Relative 4 %   Eosinophils Absolute 0.2 0.0 - 0.5 K/uL   Basophils Relative 1 %   Basophils Absolute 0.1 0.0 - 0.1 K/uL   Immature Granulocytes 0 %   Abs Immature Granulocytes 0.02 0.00 - 0.07 K/uL    Comment: Performed at [facility], [address] [place], [place] [address]  Comprehensive metabolic panel     Status: Abnormal   Collection Time: 03/10/23  4:00 AM  Result Value Ref Range   Sodium 142 135 - 145 mmol/L    Potassium 4.3 3.5 - 5.1 mmol/L   Chloride 96 (L) 98 - 111 mmol/L   CO2 28 22 - 32 mmol/L   Glucose, Bld 89 70 - 99 mg/dL    Comment: Glucose reference range applies only to samples taken after fasting for at least 8 hours.   BUN 25 (H) 8 - 23 mg/dL   Creatinine, Ser 7.84 (H) 0.44 - 1.00 mg/dL   Calcium 7.7 (L) 8.9 - 10.3 mg/dL   Total Protein 6.5 6.5 - 8.1 g/dL   Albumin 3.1 (L) 3.5 - 5.0 g/dL   AST 23 15 - 41 U/L   ALT 12 0 - 44 U/L   Alkaline Phosphatase 52 38 - 126 U/L   Total Bilirubin 0.4 0.3 - 1.2 mg/dL   GFR, Estimated 5 (L) >60 mL/min    Comment: (NOTE) Calculated using the CKD-EPI Creatinine Equation (2021)    Anion gap 18 (H) 5 - 15    Comment: Performed at [facility], [address] [place], [place] [address]  Lipase, blood     Status: Abnormal   Collection Time: 03/10/23  4:00 AM  Result Value Ref Range   Lipase 52 (H) 11 - 51 U/L    Comment: Performed at [facility], [address] [place], [place] [address]  POC occult blood, ED Provider will collect     Status: Abnormal   Collection Time: 03/10/23  4:53 AM  Result Value Ref Range   Fecal Occult Bld POSITIVE (A) NEGATIVE  Protime-INR     Status: None   Collection Time: 03/10/23  6:37 AM  Result Value Ref Range   Prothrombin Time 14.5 11.4 - 15.2 seconds   INR 1.1 0.8 - 1.2    Comment: (NOTE) INR goal varies based on device and disease states. Performed at [facility], [address] [place], [place] [address]   Type and screen [facility]     Status: None (Preliminary result)   Collection Time: 03/10/23  7:24 AM  Result Value Ref Range   ABO/RH(D) PENDING    Antibody Screen PENDING    Sample Expiration      03/13/2023,2359 Performed at [facility], [address], [place], [place] [address]    CT ABDOMEN PELVIS WO CONTRAST  Result Date: 03/10/2023 CLINICAL DATA:  Acute abdominal pain with rectal bleeding EXAM: CT ABDOMEN AND PELVIS WITHOUT CONTRAST  TECHNIQUE: Multidetector CT imaging of the abdomen and pelvis was performed following the standard protocol without IV contrast. RADIATION DOSE REDUCTION: This exam was performed according to the departmental dose-optimization program which includes automated exposure control, adjustment of the mA and/or kV according to patient size and/or use of iterative reconstruction technique. COMPARISON:  03/05/2023 FINDINGS: Lower chest: Small pleural effusions with scarring or atelectasis at the lower lobes. Atheromatous plaque. Hepatobiliary: No focal liver abnormality.No evidence of biliary obstruction or stone. Pancreas: Unremarkable. Spleen: Unremarkable. Adrenals/Urinary Tract: Negative adrenals. No hydronephrosis or stone. Pronounced renal atrophy. Bilateral simple appearing renal cysts with no follow-up imaging recommended. Unremarkable bladder. Stomach/Bowel: There are a few fluid-filled small bowel loops in the lower abdomen and pelvis but no specific obstructive pattern or clear bowel wall thickening. No appendicitis. Hemoclip appearance at the peri-pyloric region. Vascular/Lymphatic: No acute vascular abnormality. Extensive atheromatous plaque affecting the aorta and iliacs. No mass or adenopathy. Reproductive:No acute finding Other: No ascites or pneumoperitoneum. Musculoskeletal: Degenerative changes without acute finding. IMPRESSION: 1. No acute finding or specific cause for symptoms. 2. Small pleural effusions. 3. Extensive atherosclerosis. Electronically Signed   By: Tiburcio Pea M.D.   On: 03/10/2023 05:57    Pending Labs Unresulted Labs (From admission, onward)     Start     Ordered   03/11/23 0500  Basic metabolic panel  Tomorrow morning,   R        03/10/23 0820   03/11/23 0500  CBC  Tomorrow morning,   R        03/10/23 0820            Vitals/Pain Today's Vitals   03/10/23 0700 03/10/23 0727 03/10/23 0730 03/10/23 0815  BP: 137/70  129/61 136/63  Pulse: 87  84 82  Resp: 12  12 11    Temp:      TempSrc:      SpO2: 99%  100% 100%  Weight:      Height:      PainSc:  9       Isolation Precautions No active isolations  Medications Medications  acetaminophen (TYLENOL) tablet 650 mg (has no administration in time range)    Or  acetaminophen (TYLENOL) suppository 650 mg (has no administration in time range)  albuterol (PROVENTIL) (2.5 MG/3ML) 0.083% nebulizer solution 2.5 mg (has no administration in time range)  hydrALAZINE (APRESOLINE) injection 10 mg (has no administration in time range)  ondansetron (ZOFRAN) tablet 4 mg (has no administration in time range)    Or  ondansetron (ZOFRAN) injection 4 mg (has no administration in time range)  morphine (PF) 4 MG/ML injection 4 mg (4 mg Intravenous Given 03/10/23 0408)  pantoprazole (PROTONIX) 80 mg /NS 100 mL IVPB (0 mg Intravenous Stopped 03/10/23 0500)  ondansetron (ZOFRAN) injection 4 mg (4 mg Intravenous Given 03/10/23 0416)  fentaNYL (SUBLIMAZE) injection 12.5 mcg (12.5 mcg Intravenous Given 03/10/23 2951)    Mobility walks     Focused Assessments GI   R Recommendations: See Admitting Provider Note  Report given to:   Additional Notes:

## 2023-03-10 NOTE — H&P (Signed)
[facility] History and Physical  Ashley [name]:[id number] DOB: [birth year] DOA: 03/10/2023 PCP: Raymon Mutton., FNP  Presented from: Home Chief Complaint: Bright red blood per rectum  History of Present Illness: Ashley [name] is a 72 y.o. female with PMH significant for ESRD-HD-MWF, HTN, HLD, CVA, chronic pancreatitis with exocrine pancreatic insufficiency, duodenal AVMs, remote perforated PUD, h/o polysubstance abuse, treated HCV. Patient presented to the ED last night with complaint of abdominal pain and rectal bleeding. Of note, patient has history of GI bleeding.  She was hospitalized in May 2024 and underwent EGD by Dr. Myrtie Neither, noted to have a single angiectasia in duodenal bulb s/p APC. She is a dialysis patient and supposed to undergo dialysis MWF. She gets erythropoietin intermittently at dialysis. Last dialysis was Wednesday 9/25 last week.    9/26, she was seen in the ED after a small volume BRBPR.  Hemoglobin was 9.8 at the time.  CT abdomen was unremarkable and was sent to home. At home, patient reports she subsequently had multiple large volume BRBPR with only small amount of stool.  Because of her symptoms and weakness, she missed 2 sessions since then.  And hence presented to ED last night.  In the ED, patient was afebrile, blood pressure 163/66, breathing on room air Labs showed WC count 5.4, hemoglobin 7.7, BN/creatinine 25/7.45, potassium 4.3 FOBT positive CT abdomen pelvis unremarkable. In the ED, she was given IV pain meds, IV hydralazine Hospitalist service was consulted for inpatient admission and management. GI was consulted  I received this patient as a carryover admission from last night. At the time of my evaluation, patient was lying on bed.  Not in distress.  Was n.p.o. History reviewed and detailed as above. Lives at home with her daughter.  Able to ambulate independently.  Review of Systems:  All systems were reviewed and were negative unless  otherwise mentioned in the HPI   Past medical history: Past Medical History:  Diagnosis Date   Acute pancreatitis 2000   2000, 12/2018, 08/2019   Arthritis    Cervical radiculopathy 02/28/2011   Cocaine substance abuse (HCC) 05/26/2013   positive UDS    Duodenitis    Erosive gastropathy    ESRD on hemodialysis (HCC)    TTS   GERD (gastroesophageal reflux disease)    Hepatitis C 1987   dt hx IVDA.  genotype 2B.  Epclusa started early 04/2020.     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  Chronic microvascular ischemic changes w parenchymal volume loss. Chronic white matter periventricular microhemorrhage, likely due to htn   Ulcer 1990   gastric ulcer. Ruptured s/p emergency repair    Past surgical history: Past Surgical History:  Procedure Laterality Date   ABDOMINAL HYSTERECTOMY  1979   AV FISTULA PLACEMENT Left 06/16/2017   Procedure: ARTERIOVENOUS (AV) FISTULA CREATION LEFT ARM;  Surgeon: Fransisco Hertz, MD;  Location: [facility] OR;  Service: Vascular;  Laterality: Left;   BASCILIC VEIN TRANSPOSITION Left 10/02/2017   Procedure: BASILIC VEIN TRANSPOSITION SECOND STAGE LEFT ARM;  Surgeon: Larina Earthly, MD;  Location: [facility] OR;  Service: Vascular;  Laterality: Left;   BIOPSY  09/06/2019   Procedure: BIOPSY;  Surgeon: Leone Payor,  Maryjean Morn, MD;  Location: [facility] ENDOSCOPY;  Service: Endoscopy;;   ENTEROSCOPY N/A 10/22/2022   Procedure: ENTEROSCOPY;  Surgeon: Sherrilyn Rist, MD;  Location: [facility] ENDOSCOPY;  Service: Gastroenterology;  Laterality: N/A;   ESOPHAGOGASTRODUODENOSCOPY N/A 05/29/2013   Procedure: ESOPHAGOGASTRODUODENOSCOPY (EGD);  Surgeon: Beverley Fiedler, MD;  Location: [facility] ENDOSCOPY;  Service: Endoscopy;  Laterality: N/A;    ESOPHAGOGASTRODUODENOSCOPY  05/2013   for epigastric pain.  Nonobstructing Schatzki ring at GEJ, mild gastropathy, nonbleeding AVMs in bulb and D2. 5 mm sessile polyp in bulb.   ESOPHAGOGASTRODUODENOSCOPY (EGD) WITH PROPOFOL N/A 09/06/2019   Procedure: ESOPHAGOGASTRODUODENOSCOPY (EGD) WITH PROPOFOL;  Surgeon: Iva Boop, MD;  Location: [facility] ENDOSCOPY;  Service: Endoscopy;  Laterality: N/A;   ESOPHAGOGASTRODUODENOSCOPY (EGD) WITH PROPOFOL N/A 02/02/2020   Procedure: ESOPHAGOGASTRODUODENOSCOPY (EGD) WITH PROPOFOL;  Surgeon: Rachael Fee, MD;  Location: [facility] ENDOSCOPY;  Service: Endoscopy;  Laterality: N/A;   ESOPHAGOGASTRODUODENOSCOPY (EGD) WITH PROPOFOL N/A 02/07/2021   Procedure: ESOPHAGOGASTRODUODENOSCOPY (EGD) WITH PROPOFOL;  Surgeon: Rachael Fee, MD;  Location: [facility] ENDOSCOPY;  Service: Endoscopy;  Laterality: N/A;   EUS N/A 02/02/2020   Procedure: UPPER ENDOSCOPIC ULTRASOUND (EUS) RADIAL;  Surgeon: Rachael Fee, MD;  Location: [facility] ENDOSCOPY;  Service: Endoscopy;  Laterality: N/A;   EXCHANGE OF A DIALYSIS CATHETER Left 07/31/2017   Procedure: Removal  OF A  Right GroinTUNNELED  DIALYSIS CATHETER ,  Insertion of Left Femoral Dialysis Catheter.;  Surgeon: Larina Earthly, MD;  Location: [facility] OR;  Service: Vascular;  Laterality: Left;   HEMOSTASIS CLIP PLACEMENT  02/07/2021   Procedure: HEMOSTASIS CLIP PLACEMENT;  Surgeon: Rachael Fee, MD;  Location: [facility] ENDOSCOPY;  Service: Endoscopy;;   HEMOSTASIS CLIP PLACEMENT  10/22/2022   Procedure: HEMOSTASIS CLIP PLACEMENT;  Surgeon: Sherrilyn Rist, MD;  Location: [facility] ENDOSCOPY;  Service: Gastroenterology;;   HOT HEMOSTASIS N/A 09/06/2019   Procedure: HOT HEMOSTASIS (ARGON PLASMA COAGULATION/BICAP);  Surgeon: Iva Boop, MD;  Location: [facility] ENDOSCOPY;  Service: Endoscopy;  Laterality: N/A;   HOT HEMOSTASIS N/A 02/07/2021   Procedure: HOT HEMOSTASIS (ARGON PLASMA COAGULATION/BICAP);  Surgeon: Rachael Fee, MD;  Location: [facility] ENDOSCOPY;  Service:  Endoscopy;  Laterality: N/A;   HOT HEMOSTASIS N/A 10/22/2022   Procedure: HOT HEMOSTASIS (ARGON PLASMA COAGULATION/BICAP);  Surgeon: Sherrilyn Rist, MD;  Location: [facility] ENDOSCOPY;  Service: Gastroenterology;  Laterality: N/A;   INSERTION OF DIALYSIS CATHETER Right 06/16/2017   Procedure: INSERTION OF DIALYSIS CATHETER;  Surgeon: Fransisco Hertz, MD;  Location: [facility] OR;  Service: Vascular;  Laterality: Right;   IR AV DIALY SHUNT INTRO NEEDLE/INTRACATH INITIAL W/PTA/IMG LEFT  06/21/2018   REPAIR OF PERFORATED ULCER  1990   gastric ulcer   SCLEROTHERAPY  10/22/2022   Procedure: SCLEROTHERAPY;  Surgeon: Sherrilyn Rist, MD;  Location: [facility] ENDOSCOPY;  Service: Gastroenterology;;   SUBMUCOSAL TATTOO INJECTION  10/22/2022   Procedure: SUBMUCOSAL TATTOO INJECTION;  Surgeon: Sherrilyn Rist, MD;  Location: [facility] ENDOSCOPY;  Service: Gastroenterology;;    Social History:  reports that she has been smoking cigarettes. She has a 10 pack-year smoking history. She has never used smokeless tobacco. She reports that she does not currently use drugs after having used the following drugs: Heroin, Marijuana, and Cocaine. She reports that she does not drink alcohol.  Allergies:  Allergies  Allergen Reactions   Aspirin Nausea And Vomiting    Stomach ache   Ibuprofen Nausea And Vomiting    Stomach ache   Aspirin and Ibuprofen  Family history:  Family History  Problem Relation Age of Onset   Hypertension Father    Cancer Father    Hyperlipidemia Father    Seizures Sister    Early death Daughter    Kidney disease Daughter        end stage dialysis dependent    Breast cancer Maternal Aunt      Physical Exam: Vitals:   03/10/23 0700 03/10/23 0730 03/10/23 0815 03/10/23 0906  BP: 137/70 129/61 136/63 (!) 156/72  Pulse: 87 84 82 92  Resp: 12 12 11 17   Temp:    98.6 F (37 C)  TempSrc:    Oral  SpO2: 99% 100% 100% 100%  Weight:      Height:       Wt Readings from Last 3 Encounters:  03/10/23 47  kg  03/05/23 47.2 kg  10/23/22 48.7 kg   Body mass index is 18.95 kg/m.  General exam: Pleasant, elderly African-American female. Skin: No rashes, lesions or ulcers. HEENT: Atraumatic, normocephalic, no obvious bleeding Lungs: Clear to auscultation bilaterally CVS: Regular rate and rhythm, no murmur GI/Abd soft, nontender, nondistended, bowel sound present CNS: Alert, awake, oriented x 3 Psychiatry: Mood appropriate Extremities: No pedal edema, no calf tenderness   ------------------------------------------------------------------------------------------------------ Assessment/Plan: Principal Problem:   Acute GI bleeding Active Problems:   Acute on chronic anemia  Acute GI bleeding H/o GI bleeding - duodenal AVMs, remote perforated PUD, Presented with multiple episode of BRBPR Noted to have hemoglobin lower than baseline. GI consulted  Tentatively plan for EGD and colonoscopy on Thursday.. Full liquid diet for today  Acute on chronic blood loss anemia Hemoglobin low at 7.7 today.  Continue to monitor.  Transfuse if less than 7. Protonix continue Recent Labs    09/06/22 0844 10/20/22 1700 10/21/22 0100 10/22/22 0900 10/23/22 0033 03/05/23 1248 03/10/23 0400  HGB  --    < > 7.4* 7.5* 8.3* 9.8* 7.7*  MCV  --    < > 92.6 90.0 92.9 90.6 89.3  FERRITIN 1,296*  --   --   --   --   --   --   TIBC 158*  --   --   --   --   --   --   IRON 31  --   --   --   --   --   --    < > = values in this interval not displayed.   ESRD-HD-MWF Last dialysis was Wednesday 9/25 last week.   Nephrology consulted. Continue Sensipar, Renvela  HTN PTA meds- amlodipine 10 mg daily, hydralazine 75 mg 3 times daily Resume amlodipine.  Keep hydralazine on hold.  Continue to monitor blood pressure.  IV hydralazine as needed.  CVA/HLD PTA meds-continue Lipitor 20 mg daily  chronic pancreatitis with exocrine pancreatic insufficiency Supposed to be on Creon,??  Not  taking   Polyneuropathy Continue Neurontin nightly  h/o polysubstance abuse treated HCV Reports currently not using drugs.  Follow-up as an outpatient for HCV     Mobility: Lives at home with her daughter.  Able to ambulate independently.  Goals of care   Code Status: Full Code    DVT prophylaxis:  SCDs Start: 03/10/23 0820   Antimicrobials: None Fluid: None Consultants: GI, nephro Family Communication: None at bedside  Dispo: The patient is from: Home              Anticipated d/c is to: Home after GI workup  Diet: Diet Order  Diet NPO time specified Except for: Sips with Meds  Diet effective ____           Diet clear liquid Room service appropriate? Yes; Fluid consistency: Thin  Diet effective now           Diet full liquid Room service appropriate? Yes; Fluid consistency: Thin  Diet effective now                    ------------------------------------------------------------------------------------- Severity of Illness: The appropriate patient status for this patient is INPATIENT. Inpatient status is judged to be reasonable and necessary in order to provide the required intensity of service to ensure the patient's safety. The patient's presenting symptoms, physical exam findings, and initial radiographic and laboratory data in the context of their chronic comorbidities is felt to place them at high risk for further clinical deterioration. Furthermore, it is not anticipated that the patient will be medically stable for discharge from the hospital within 2 midnights of admission.   * I certify that at the point of admission it is my clinical judgment that the patient will require inpatient hospital care spanning beyond 2 midnights from the point of admission due to high intensity of service, high risk for further deterioration and high frequency of surveillance  required.* -------------------------------------------------------------------------------------  Home Meds: Prior to Admission medications   Medication Sig Start Date End Date Taking? Authorizing Provider  amLODipine (NORVASC) 10 MG tablet Take 1 tablet (10 mg total) by mouth daily. Patient taking differently: Take 10 mg by mouth daily at 12 noon. 11/11/16  Yes Caryl Ada Y, DO  atorvastatin (LIPITOR) 20 MG tablet Take 20 mg by mouth at bedtime. 08/05/20  Yes [provider]  B Complex-C-Zn-Folic Acid (DIALYVITE 800 WITH ZINC) 0.8 MG TABS Take 1 tablet by mouth daily.  07/07/19  Yes [provider]  cinacalcet (SENSIPAR) 60 MG tablet Take 60 mg by mouth every evening. 03/23/21  Yes [provider]  gabapentin (NEURONTIN) 100 MG capsule Take 200 mg by mouth at bedtime.   Yes [provider]  hydrALAZINE (APRESOLINE) 25 MG tablet Take 3 tablets (75 mg total) by mouth every 8 (eight) hours. 11/14/20 03/09/24 Yes [place], Eric J, DO  pantoprazole (PROTONIX) 40 MG tablet Take 1 tablet (40 mg total) by mouth daily. 03/05/23 06/03/23 Yes Barrett, Horald Chestnut, PA-C  sevelamer carbonate (RENVELA) 800 MG tablet Take 800 mg by mouth 3 (three) times daily with meals. 07/23/20  Yes [provider]  CREON 12000-38000 units CPEP capsule Take by mouth. Patient not taking: Reported on 03/10/2023 03/02/23   [provider]  lidocaine-prilocaine (EMLA) cream Apply 1 application topically See admin instructions. Apply small amount to access site on Tuesday, Thursday, Saturday one hour before dialysis. Cover with occlusive dressing (saran wrap) Patient not taking: Reported on 09/04/2022 08/25/19   [provider]    Labs on Admission:   CBC: Recent Labs  Lab 03/05/23 1248 03/10/23 0400  WBC 5.0 5.4  NEUTROABS 2.5 3.4  HGB 9.8* 7.7*  HCT 30.8* 24.2*  MCV 90.6 89.3  PLT 249 290    Basic Metabolic Panel: Recent Labs  Lab 03/05/23 1248 03/10/23 0400   NA 140 142  K 4.3 4.3  CL 99 96*  CO2 26 28  GLUCOSE 85 89  BUN 27* 25*  CREATININE 6.27* 7.45*  CALCIUM 8.5* 7.7*    Liver Function Tests: Recent Labs  Lab 03/05/23 1248 03/10/23 0400  AST 24 23  ALT 14 12  ALKPHOS 56 52  BILITOT 0.6 0.4  PROT 7.1 6.5  ALBUMIN 3.7 3.1*   Recent Labs  Lab 03/05/23 1248 03/10/23 0400  LIPASE 117* 52*   No results for input(s): "AMMONIA" in the last 168 hours.  Cardiac Enzymes: No results for input(s): "CKTOTAL", "CKMB", "CKMBINDEX", "TROPONINI" in the last 168 hours.  BNP (last 3 results) No results for input(s): "BNP" in the last 8760 hours.  ProBNP (last 3 results) No results for input(s): "PROBNP" in the last 8760 hours.  CBG: No results for input(s): "GLUCAP" in the last 168 hours.  Lipase     Component Value Date/Time   LIPASE 52 (H) 03/10/2023 0400     Urinalysis    Component Value Date/Time   COLORURINE YELLOW 12/31/2018 1014   APPEARANCEUR CLEAR 12/31/2018 1014   LABSPEC 1.008 12/31/2018 1014   PHURINE 9.0 (H) 12/31/2018 1014   GLUCOSEU NEGATIVE 12/31/2018 1014   HGBUR SMALL (A) 12/31/2018 1014   BILIRUBINUR NEGATIVE 12/31/2018 1014   BILIRUBINUR NEG 01/29/2016 1142   KETONESUR NEGATIVE 12/31/2018 1014   PROTEINUR >=300 (A) 12/31/2018 1014   UROBILINOGEN 0.2 01/29/2016 1142   UROBILINOGEN 0.2 03/21/2015 1649   NITRITE NEGATIVE 12/31/2018 1014   LEUKOCYTESUR NEGATIVE 12/31/2018 1014     Drugs of Abuse     Component Value Date/Time   LABOPIA POSITIVE (A) 09/06/2019 2340   COCAINSCRNUR NONE DETECTED 09/06/2019 2340   COCAINSCRNUR NEG 11/28/2013 1704   LABBENZ NONE DETECTED 09/06/2019 2340   LABBENZ NEG 11/28/2013 1704   AMPHETMU NONE DETECTED 09/06/2019 2340   THCU NONE DETECTED 09/06/2019 2340   LABBARB NONE DETECTED 09/06/2019 2340      Radiological Exams on Admission: CT ABDOMEN PELVIS WO CONTRAST  Result Date: 03/10/2023 CLINICAL DATA:  Acute abdominal pain with rectal bleeding EXAM: CT  ABDOMEN AND PELVIS WITHOUT CONTRAST TECHNIQUE: Multidetector CT imaging of the abdomen and pelvis was performed following the standard protocol without IV contrast. RADIATION DOSE REDUCTION: This exam was performed according to the departmental dose-optimization program which includes automated exposure control, adjustment of the mA and/or kV according to patient size and/or use of iterative reconstruction technique. COMPARISON:  03/05/2023 FINDINGS: Lower chest: Small pleural effusions with scarring or atelectasis at the lower lobes. Atheromatous plaque. Hepatobiliary: No focal liver abnormality.No evidence of biliary obstruction or stone. Pancreas: Unremarkable. Spleen: Unremarkable. Adrenals/Urinary Tract: Negative adrenals. No hydronephrosis or stone. Pronounced renal atrophy. Bilateral simple appearing renal cysts with no follow-up imaging recommended. Unremarkable bladder. Stomach/Bowel: There are a few fluid-filled small bowel loops in the lower abdomen and pelvis but no specific obstructive pattern or clear bowel wall thickening. No appendicitis. Hemoclip appearance at the peri-pyloric region. Vascular/Lymphatic: No acute vascular abnormality. Extensive atheromatous plaque affecting the aorta and iliacs. No mass or adenopathy. Reproductive:No acute finding Other: No ascites or pneumoperitoneum. Musculoskeletal: Degenerative changes without acute finding. IMPRESSION: 1. No acute finding or specific cause for symptoms. 2. Small pleural effusions. 3. Extensive atherosclerosis. Electronically Signed   By: Tiburcio Pea M.D.   On: 03/10/2023 05:57     Signed, Lorin Glass, MD [facility] 03/10/2023

## 2023-03-10 NOTE — ED Triage Notes (Signed)
Patient C/O generalized ABD pain and has continued with rectal bleeding since 03/05/23, the bleeding has become more severe since then. She was seen her on 03/05/23

## 2023-03-10 NOTE — Consult Note (Addendum)
Consultation  Referring Provider:   [facility] Primary Care Physician:  Raymon Mutton., FNP Primary Gastroenterologist:  Dr. Rhea Belton       Reason for Consultation:   GI bleed   DOA: 03/10/2023         Hospital Day: 1         HPI:   Ashley [name] is a 72 y.o. female with past medical history significant for chronic pancreatitis with EPI, small bowel ( duodenal) AVMs, duodenitis, remote perforated PUD, HTN, CVA, ESRD on HD Monday Wednesday Friday, history of HCV treated negative viral load August 2021.   10/21/2022 hospital visit for acute on chronic anemia Hgb 8 10/22/2022 small bowel endoscopy Dr. Myrtie Neither showed normal esophagus, normal stomach no evidence of significant pathology in the entire examined portion of jejunum single angiectasia in duodenal bulb status post APC and epi with MR, suggest PPI 4 weeks and supportive care outpatient with dialysis. Patient has never had a colonoscopy.  Presents to the ER with abdominal pain and rectal bleeding..  Work up notable for  Hgb 7.7 BUN 25, creatinine 7.45 Albumin 3.1 FOBT positive Lipase 52 CT abdomen pelvis without contrast showed no acute findings or specific causes of symptoms, small pleural effusions, extensive atherosclerosis.  No family was present at the time of my evaluation. Missed one dialysis last week.  Has been getting iron and EPO there per patient, last one Monday, will need to confirm.  She states last Thursday (09/26) she noticed small volume BRB on stool, she came up to the ER and was sent back home. HGB 9.8 at that time. CT Wall thickening in the stomach antrum, probably from nondistention, antritis is a less likely differential diagnostic consideration. 3. New hemostatic clip in the duodenal bulb, placed during endoscopy on 10/22/2022. She then had a large volume BRB large volume blood, small amount of stool. Was having associated AB cramping with it.  Last BM was Monday.  She states she has been losing weight for  months, about 30 lbs.  She has no appetite, no fever, chills. Denies GERD, nausea, vomiting.  She has history of BM daily or every other day, loose to formed, no change in bowel habits.  She has not been on creon last hospital visit, uncertain if this helped her stools.   Father with possible colon cancer or GF, uncertain which.  No NSAIDS, no alcohol. She is not on a blood thinner.   She is on 3L [place] here, not on at home, denies SOB, cough, CP. No swelling in legs or AB, dialysis yesterday.   Previous GI Evaluation:    Dec 2014 EGD  March 2021 Epigastric pain  - Erosive gastropathy with no stigmata of recent bleeding. Biopsied. - Duodenitis. Biopsied. - A single non-bleeding angiodysplastic lesion in the duodenum. Treated with argon plasma coagulation (APC). Thought reasonable to ablate as at higher risk of bleeding given comorbidities - The examination was otherwise normal.  Biopsies - peptic duodenitis.    Aug 2021 EUS - Impressive parenchymal and ductal changes that are consistent with chronic pancreatitis throughout the gland.   Sept 2022 EGD for melena / chronic anemia - Three small angioectasias with bleeding on contact were found in the duodenal bulb and in the second portion of the duodenum. I treated each with APC cautery and they all bled during treatment. One (in the second portion of the duodenum) bled briskly enough after APC application that I decided to place a  single endoclip at the site with immediate hemostasis. - The examination was otherwise normal.    Abnormal ED labs: Abnormal Labs Reviewed  CBC WITH DIFFERENTIAL/PLATELET - Abnormal; Notable for the following components:      Result Value   RBC 2.71 (*)    Hemoglobin 7.7 (*)    HCT 24.2 (*)    RDW 20.9 (*)    All other components within normal limits  COMPREHENSIVE METABOLIC PANEL - Abnormal; Notable for the following components:   Chloride 96 (*)    BUN 25 (*)    Creatinine, Ser 7.45 (*)    Calcium 7.7 (*)     Albumin 3.1 (*)    GFR, Estimated 5 (*)    Anion gap 18 (*)    All other components within normal limits  LIPASE, BLOOD - Abnormal; Notable for the following components:   Lipase 52 (*)    All other components within normal limits  POC OCCULT BLOOD, ED - Abnormal; Notable for the following components:   Fecal Occult Bld POSITIVE (*)    All other components within normal limits    Past Medical History:  Diagnosis Date   Acute pancreatitis 2000   2000, 12/2018, 08/2019   Arthritis    Cervical radiculopathy 02/28/2011   Cocaine substance abuse (HCC) 05/26/2013   positive UDS    Duodenitis    Erosive gastropathy    ESRD on hemodialysis (HCC)    TTS   GERD (gastroesophageal reflux disease)    Hepatitis C 1987   dt hx IVDA.  genotype 2B.  Epclusa started early 04/2020.     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  Chronic microvascular ischemic changes w parenchymal volume loss. Chronic white matter periventricular microhemorrhage, likely due to htn   Ulcer 1990   gastric ulcer. Ruptured s/p emergency repair    Surgical History:  She  has a past surgical history that includes Abdominal hysterectomy (1979); Repair of perforated ulcer (1990); Esophagogastroduodenoscopy (N/A, 05/29/2013); Insertion of dialysis catheter (Right, 06/16/2017); AV fistula placement (Left, 06/16/2017); Exchange of a dialysis catheter (Left, 07/31/2017); Bascilic vein transposition (Left, 10/02/2017); IR AV DIALY SHUNT INTRO NEEDLE/INTRACATH INITIAL W/PTA/IMG LEFT (06/21/2018); Esophagogastroduodenoscopy (05/2013); Esophagogastroduodenoscopy (egd) with propofol (N/A, 09/06/2019); biopsy (09/06/2019); Hot hemostasis (N/A, 09/06/2019); EUS (N/A, 02/02/2020);  Esophagogastroduodenoscopy (egd) with propofol (N/A, 02/02/2020); Esophagogastroduodenoscopy (egd) with propofol (N/A, 02/07/2021); Hot hemostasis (N/A, 02/07/2021); Hemostasis clip placement (02/07/2021); enteroscopy (N/A, 10/22/2022); Submucosal tattoo injection (10/22/2022); Hot hemostasis (N/A, 10/22/2022); Sclerotherapy (10/22/2022); and Hemostasis clip placement (10/22/2022). Family History:  Her family history includes Breast cancer in her maternal aunt; Cancer in her father; Early death in her daughter; Hyperlipidemia in her father; Hypertension in her father; Kidney disease in her daughter; Seizures in her sister. Social History:   reports that she has been smoking cigarettes. She has a 10 pack-year smoking history. She has never used smokeless tobacco. She reports that she does not currently use drugs after having used the following drugs: Heroin, Marijuana, and Cocaine. She reports that she does not drink alcohol.  Prior to Admission medications   Medication Sig Start Date End Date Taking? Authorizing Provider  amLODipine (NORVASC) 10 MG tablet Take 1 tablet (10 mg total) by mouth daily. Patient  taking differently: Take 10 mg by mouth daily at 12 noon. 11/11/16  Yes Caryl Ada Y, DO  atorvastatin (LIPITOR) 20 MG tablet Take 20 mg by mouth at bedtime. 08/05/20  Yes [provider]  B Complex-C-Zn-Folic Acid (DIALYVITE 800 WITH ZINC) 0.8 MG TABS Take 1 tablet by mouth daily.  07/07/19  Yes [provider]  cinacalcet (SENSIPAR) 60 MG tablet Take 60 mg by mouth every evening. 03/23/21  Yes [provider]  gabapentin (NEURONTIN) 100 MG capsule Take 200 mg by mouth at bedtime.   Yes [provider]  hydrALAZINE (APRESOLINE) 25 MG tablet Take 3 tablets (75 mg total) by mouth every 8 (eight) hours. 11/14/20 03/09/24 Yes [place], Eric J, DO  pantoprazole (PROTONIX) 40 MG tablet Take 1 tablet (40 mg total) by mouth daily. 03/05/23 06/03/23 Yes Barrett, Horald Chestnut, PA-C  sevelamer  carbonate (RENVELA) 800 MG tablet Take 800 mg by mouth 3 (three) times daily with meals. 07/23/20  Yes [provider]  CREON 12000-38000 units CPEP capsule Take by mouth. Patient not taking: Reported on 03/10/2023 03/02/23   [provider]  lidocaine-prilocaine (EMLA) cream Apply 1 application topically See admin instructions. Apply small amount to access site on Tuesday, Thursday, Saturday one hour before dialysis. Cover with occlusive dressing (saran wrap) Patient not taking: Reported on 09/04/2022 08/25/19   [provider]    Current Facility-Administered Medications  Medication Dose Route Frequency Provider Last Rate Last Admin   acetaminophen (TYLENOL) tablet 650 mg  650 mg Oral Q6H PRN Dahal, Melina Schools, MD       Or   acetaminophen (TYLENOL) suppository 650 mg  650 mg Rectal Q6H PRN Dahal, Binaya, MD       albuterol (PROVENTIL) (2.5 MG/3ML) 0.083% nebulizer solution 2.5 mg  2.5 mg Nebulization Q6H Dahal, Binaya, MD       hydrALAZINE (APRESOLINE) injection 10 mg  10 mg Intravenous Q6H PRN Dahal, Binaya, MD       ondansetron (ZOFRAN) tablet 4 mg  4 mg Oral Q6H PRN Dahal, Binaya, MD       Or   ondansetron (ZOFRAN) injection 4 mg  4 mg Intravenous Q6H PRN Dahal, Binaya, MD       Current Outpatient Medications  Medication Sig Dispense Refill   amLODipine (NORVASC) 10 MG tablet Take 1 tablet (10 mg total) by mouth daily. (Patient taking differently: Take 10 mg by mouth daily at 12 noon.) 90 tablet 1   atorvastatin (LIPITOR) 20 MG tablet Take 20 mg by mouth at bedtime.     B Complex-C-Zn-Folic Acid (DIALYVITE 800 WITH ZINC) 0.8 MG TABS Take 1 tablet by mouth daily.      cinacalcet (SENSIPAR) 60 MG tablet Take 60 mg by mouth every evening.     gabapentin (NEURONTIN) 100 MG capsule Take 200 mg by mouth at bedtime.     hydrALAZINE (APRESOLINE) 25 MG tablet Take 3 tablets (75 mg total) by mouth every 8 (eight) hours. 270 tablet 2   pantoprazole (PROTONIX) 40 MG tablet Take 1  tablet (40 mg total) by mouth daily. 30 tablet 2   sevelamer carbonate (RENVELA) 800 MG tablet Take 800 mg by mouth 3 (three) times daily with meals.     CREON 12000-38000 units CPEP capsule Take by mouth. (Patient not taking: Reported on 03/10/2023)     lidocaine-prilocaine (EMLA) cream Apply 1 application topically See admin instructions. Apply small amount to access site on Tuesday, Thursday, Saturday one hour before dialysis. Cover with occlusive  dressing (saran wrap) (Patient not taking: Reported on 09/04/2022)      Allergies as of 03/10/2023 - Review Complete 03/10/2023  Allergen Reaction Noted   Aspirin Nausea And Vomiting 07/18/2009   Ibuprofen Nausea And Vomiting 12/24/2009    Review of Systems:    Constitutional: No weight loss, fever, chills, weakness or fatigue HEENT: Eyes: No change in vision               Ears, Nose, Throat:  No change in hearing or congestion Skin: No rash or itching Cardiovascular: No chest pain, chest pressure or palpitations   Respiratory: No SOB or cough Gastrointestinal: See HPI and otherwise negative Genitourinary: No dysuria or change in urinary frequency Neurological: No headache, dizziness or syncope Musculoskeletal: No new muscle or joint pain Hematologic: No bleeding or bruising Psychiatric: No history of depression or anxiety     Physical Exam:  Vital signs in last 24 hours: Temp:  [98.5 F (36.9 C)-98.8 F (37.1 C)] 98.5 F (36.9 C) (10/01 0653) Pulse Rate:  [81-98] 82 (10/01 0815) Resp:  [10-18] 11 (10/01 0815) BP: (129-163)/(55-73) 136/63 (10/01 0815) SpO2:  [96 %-100 %] 100 % (10/01 0815) Weight:  [47 kg] 47 kg (10/01 0305)   Last BM recorded by nurses in past 5 days No data recorded  General:   chronically ill appearing female in no acute distress Head:  Normocephalic and atraumatic. Eyes: sclerae anicteric,conjunctive pale  Heart:  LSB murmur, possible from fistual, RRR  Pulm: 3 L [place] with expiratory wheezing, clear  bases Abdomen:  soft AB, LLQ ab tenderness with rebound slight.  Extremities:  Without edema, fistula left arm Msk:  Symmetrical without gross deformities. Peripheral pulses intact.  Neurologic:  Alert and  oriented x4;  No focal deficits.  Skin:   Dry and intact without significant lesions or rashes. Psychiatric:  Cooperative. Normal mood and affect.  LAB RESULTS: Recent Labs    03/10/23 0400  WBC 5.4  HGB 7.7*  HCT 24.2*  PLT 290   BMET Recent Labs    03/10/23 0400  NA 142  K 4.3  CL 96*  CO2 28  GLUCOSE 89  BUN 25*  CREATININE 7.45*  CALCIUM 7.7*   LFT Recent Labs    03/10/23 0400  PROT 6.5  ALBUMIN 3.1*  AST 23  ALT 12  ALKPHOS 52  BILITOT 0.4   PT/INR Recent Labs    03/10/23 0637  LABPROT 14.5  INR 1.1    STUDIES: CT ABDOMEN PELVIS WO CONTRAST  Result Date: 03/10/2023 CLINICAL DATA:  Acute abdominal pain with rectal bleeding EXAM: CT ABDOMEN AND PELVIS WITHOUT CONTRAST TECHNIQUE: Multidetector CT imaging of the abdomen and pelvis was performed following the standard protocol without IV contrast. RADIATION DOSE REDUCTION: This exam was performed according to the departmental dose-optimization program which includes automated exposure control, adjustment of the mA and/or kV according to patient size and/or use of iterative reconstruction technique. COMPARISON:  03/05/2023 FINDINGS: Lower chest: Small pleural effusions with scarring or atelectasis at the lower lobes. Atheromatous plaque. Hepatobiliary: No focal liver abnormality.No evidence of biliary obstruction or stone. Pancreas: Unremarkable. Spleen: Unremarkable. Adrenals/Urinary Tract: Negative adrenals. No hydronephrosis or stone. Pronounced renal atrophy. Bilateral simple appearing renal cysts with no follow-up imaging recommended. Unremarkable bladder. Stomach/Bowel: There are a few fluid-filled small bowel loops in the lower abdomen and pelvis but no specific obstructive pattern or clear bowel wall  thickening. No appendicitis. Hemoclip appearance at the peri-pyloric region. Vascular/Lymphatic: No acute vascular abnormality.  Extensive atheromatous plaque affecting the aorta and iliacs. No mass or adenopathy. Reproductive:No acute finding Other: No ascites or pneumoperitoneum. Musculoskeletal: Degenerative changes without acute finding. IMPRESSION: 1. No acute finding or specific cause for symptoms. 2. Small pleural effusions. 3. Extensive atherosclerosis. Electronically Signed   By: Tiburcio Pea M.D.   On: 03/10/2023 05:57      Impression    Acute on chronic GI bleed with history of small bowel AVMs Hematochezia with lower abdominal discomfort Presenting Hgb 7.7 compared to 9.8 at ER visit 9/26 10/22/2018 for small bowel enteroscopy Dr. Myrtie Neither status post APC's, epi, MR duodenal bulb Patient is never had colonoscopy Has been getting IV iron per patient with dialysis last time [place]  Acute respiratory failure/history of COPD, possible exacerbation On 3 L [place] Some expiratory wheezing, no fever, chills.   End-stage renal disease Dialysis Monday Wednesday Friday, will get tomorrow Likely contributing to anemia  Chronic pancreatitis confirmed by EUS with likely EPI Some loose loose stools and significant weight loss Trial of Creon with snacks and meals    Principal Problem:   Acute GI bleeding    LOS: 0 days     Plan   Possible ischemic colitis with AB pain, CT unremarkable, no diverticula noted, never had colon and has had weight loss over last several months.  Feels she is unable to prep today and patient is on 3 L [place] with wheezing Optimize pulmonary status today and tomorrow with hopes for colon/EGD Thursday Patient agrees with this Full liquid diet today, clear liquid tomorrow -- half dose prep tonight, full prep Wednesday with colonoscopy and enteroscopy Thursday if appropriate, will check on her tomorrow --Continue to monitor H&H with transfusion as needed to maintain  hemoglobin greater than 7. -Please obtain early AM CBC, CMET, INR - dialysis M,W,F - pulmonary optimization per primary -Continue supportive care with B12 and IV iron if needed -Creon 36,000, 2 pills with food 1 pill with snacks when patient is eating again  Thank you for your kind consultation, we will continue to follow.   Doree Albee  03/10/2023, 8:30 AM

## 2023-03-10 NOTE — Consult Note (Signed)
Renal Service Consult Note [facility]  Ashley [name] 03/10/2023 [name], MD Requesting Physician: Dr Pola Corn  Reason for Consult: ESRD pt w/ abd pain and rectal bleeding HPI: The patient is a 72 y.o. year-old w/ PMH as below who presented to ED for abd pain and rectal bleeding. In ED BSS, Hb 7.7, creat 7.4, K 43.  CT abd was unremarkable. In ED pt rec'd IV hydralazine and pain meds. Pt was admitted for GI bleed. We are asked to see for dialysis.   Pt seen in room. Pt is in pain and poor historian. No SOB, cough or chest pain, no leg swelling.   ROS - denies CP, no joint pain, no HA, no blurry vision, no rash, no dysuria, no difficulty voiding   Past Medical History  Past Medical History:  Diagnosis Date   Acute pancreatitis 2000   2000, 12/2018, 08/2019   Arthritis    Cervical radiculopathy 02/28/2011   Cocaine substance abuse (HCC) 05/26/2013   positive UDS    Duodenitis    Erosive gastropathy    ESRD on hemodialysis (HCC)    TTS   GERD (gastroesophageal reflux disease)    Hepatitis C 1987   dt hx IVDA.  genotype 2B.  Epclusa started early 04/2020.     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  Chronic microvascular ischemic changes w parenchymal volume loss. Chronic white matter periventricular microhemorrhage, likely due to htn   Ulcer 1990   gastric ulcer. Ruptured s/p emergency repair   Past Surgical History  Past Surgical History:  Procedure Laterality Date   ABDOMINAL HYSTERECTOMY  1979   AV FISTULA PLACEMENT Left 06/16/2017   Procedure: ARTERIOVENOUS (AV) FISTULA CREATION LEFT ARM;  Surgeon: Fransisco Hertz, MD;  Location: [facility] OR;  Service: Vascular;  Laterality: Left;   BASCILIC VEIN TRANSPOSITION  Left 10/02/2017   Procedure: BASILIC VEIN TRANSPOSITION SECOND STAGE LEFT ARM;  Surgeon: Larina Earthly, MD;  Location: [facility] OR;  Service: Vascular;  Laterality: Left;   BIOPSY  09/06/2019   Procedure: BIOPSY;  Surgeon: Iva Boop, MD;  Location: [facility] ENDOSCOPY;  Service: Endoscopy;;   ENTEROSCOPY N/A 10/22/2022   Procedure: ENTEROSCOPY;  Surgeon: Sherrilyn Rist, MD;  Location: [facility] ENDOSCOPY;  Service: Gastroenterology;  Laterality: N/A;   ESOPHAGOGASTRODUODENOSCOPY N/A 05/29/2013   Procedure: ESOPHAGOGASTRODUODENOSCOPY (EGD);  Surgeon: Beverley Fiedler, MD;  Location: [facility] ENDOSCOPY;  Service: Endoscopy;  Laterality: N/A;   ESOPHAGOGASTRODUODENOSCOPY  05/2013   for epigastric pain.  Nonobstructing Schatzki ring at GEJ, mild gastropathy, nonbleeding AVMs in bulb and D2. 5 mm sessile polyp in bulb.   ESOPHAGOGASTRODUODENOSCOPY (EGD) WITH PROPOFOL N/A 09/06/2019   Procedure: ESOPHAGOGASTRODUODENOSCOPY (EGD) WITH PROPOFOL;  Surgeon: Iva Boop, MD;  Location: [facility] ENDOSCOPY;  Service: Endoscopy;  Laterality: N/A;   ESOPHAGOGASTRODUODENOSCOPY (EGD) WITH PROPOFOL N/A 02/02/2020   Procedure: ESOPHAGOGASTRODUODENOSCOPY (EGD) WITH PROPOFOL;  Surgeon: Rachael Fee, MD;  Location: [facility] ENDOSCOPY;  Service: Endoscopy;  Laterality: N/A;   ESOPHAGOGASTRODUODENOSCOPY (EGD) WITH PROPOFOL N/A 02/07/2021   Procedure: ESOPHAGOGASTRODUODENOSCOPY (EGD) WITH PROPOFOL;  Surgeon: Rachael Fee, MD;  Location: [facility] ENDOSCOPY;  Service: Endoscopy;  Laterality: N/A;   EUS N/A 02/02/2020   Procedure: UPPER  ENDOSCOPIC ULTRASOUND (EUS) RADIAL;  Surgeon: Rachael Fee, MD;  Location: [facility] ENDOSCOPY;  Service: Endoscopy;  Laterality: N/A;   EXCHANGE OF A DIALYSIS CATHETER Left 07/31/2017   Procedure: Removal  OF A  Right GroinTUNNELED  DIALYSIS CATHETER ,  Insertion of Left Femoral Dialysis Catheter.;  Surgeon: Larina Earthly, MD;  Location: [facility] OR;  Service: Vascular;  Laterality: Left;   HEMOSTASIS CLIP PLACEMENT  02/07/2021    Procedure: HEMOSTASIS CLIP PLACEMENT;  Surgeon: Rachael Fee, MD;  Location: [facility] ENDOSCOPY;  Service: Endoscopy;;   HEMOSTASIS CLIP PLACEMENT  10/22/2022   Procedure: HEMOSTASIS CLIP PLACEMENT;  Surgeon: Sherrilyn Rist, MD;  Location: [facility] ENDOSCOPY;  Service: Gastroenterology;;   HOT HEMOSTASIS N/A 09/06/2019   Procedure: HOT HEMOSTASIS (ARGON PLASMA COAGULATION/BICAP);  Surgeon: Iva Boop, MD;  Location: [facility] ENDOSCOPY;  Service: Endoscopy;  Laterality: N/A;   HOT HEMOSTASIS N/A 02/07/2021   Procedure: HOT HEMOSTASIS (ARGON PLASMA COAGULATION/BICAP);  Surgeon: Rachael Fee, MD;  Location: [facility] ENDOSCOPY;  Service: Endoscopy;  Laterality: N/A;   HOT HEMOSTASIS N/A 10/22/2022   Procedure: HOT HEMOSTASIS (ARGON PLASMA COAGULATION/BICAP);  Surgeon: Sherrilyn Rist, MD;  Location: [facility] ENDOSCOPY;  Service: Gastroenterology;  Laterality: N/A;   INSERTION OF DIALYSIS CATHETER Right 06/16/2017   Procedure: INSERTION OF DIALYSIS CATHETER;  Surgeon: Fransisco Hertz, MD;  Location: [facility] OR;  Service: Vascular;  Laterality: Right;   IR AV DIALY SHUNT INTRO NEEDLE/INTRACATH INITIAL W/PTA/IMG LEFT  06/21/2018   REPAIR OF PERFORATED ULCER  1990   gastric ulcer   SCLEROTHERAPY  10/22/2022   Procedure: SCLEROTHERAPY;  Surgeon: Sherrilyn Rist, MD;  Location: [facility] ENDOSCOPY;  Service: Gastroenterology;;   SUBMUCOSAL TATTOO INJECTION  10/22/2022   Procedure: SUBMUCOSAL TATTOO INJECTION;  Surgeon: Sherrilyn Rist, MD;  Location: [facility] ENDOSCOPY;  Service: Gastroenterology;;   Family History  Family History  Problem Relation Age of Onset   Hypertension Father    Cancer Father    Hyperlipidemia Father    Seizures Sister    Early death Daughter    Kidney disease Daughter        end stage dialysis dependent    Breast cancer Maternal Aunt    Social History  reports that she has been smoking cigarettes. She has a 10 pack-year smoking history. She has never used smokeless tobacco. She reports that she does not  currently use drugs after having used the following drugs: Heroin, Marijuana, and Cocaine. She reports that she does not drink alcohol. Allergies  Allergies  Allergen Reactions   Aspirin Nausea And Vomiting    Stomach ache   Ibuprofen Nausea And Vomiting    Stomach ache   Home medications Prior to Admission medications   Medication Sig Start Date End Date Taking? Authorizing Provider  amLODipine (NORVASC) 10 MG tablet Take 1 tablet (10 mg total) by mouth daily. Patient taking differently: Take 10 mg by mouth daily at 12 noon. 11/11/16  Yes Caryl Ada Y, DO  atorvastatin (LIPITOR) 20 MG tablet Take 20 mg by mouth at bedtime. 08/05/20  Yes [provider]  B Complex-C-Zn-Folic Acid (DIALYVITE 800 WITH ZINC) 0.8 MG TABS Take 1 tablet by mouth daily.  07/07/19  Yes [provider]  cinacalcet (SENSIPAR) 60 MG tablet Take 60 mg by mouth every evening. 03/23/21  Yes [provider]  gabapentin (NEURONTIN) 100 MG capsule Take 200 mg by mouth at bedtime.   Yes [provider]  hydrALAZINE (APRESOLINE) 25 MG  tablet Take 3 tablets (75 mg total) by mouth every 8 (eight) hours. 11/14/20 03/09/24 Yes [place], Eric J, DO  pantoprazole (PROTONIX) 40 MG tablet Take 1 tablet (40 mg total) by mouth daily. 03/05/23 06/03/23 Yes Barrett, Horald Chestnut, PA-C  sevelamer carbonate (RENVELA) 800 MG tablet Take 800 mg by mouth 3 (three) times daily with meals. 07/23/20  Yes [provider]  CREON 12000-38000 units CPEP capsule Take by mouth. Patient not taking: Reported on 03/10/2023 03/02/23   [provider]  lidocaine-prilocaine (EMLA) cream Apply 1 application topically See admin instructions. Apply small amount to access site on Tuesday, Thursday, Saturday one hour before dialysis. Cover with occlusive dressing (saran wrap) Patient not taking: Reported on 09/04/2022 08/25/19   [provider]     Vitals:   03/10/23 0700 03/10/23 0730 03/10/23 0815 03/10/23 0906   BP: 137/70 129/61 136/63 (!) 156/72  Pulse: 87 84 82 92  Resp: 12 12 11 17   Temp:    98.6 F (37 C)  TempSrc:    Oral  SpO2: 99% 100% 100% 100%  Weight:      Height:       Exam Gen alert, no distress, elderly AAF in pain No rash, cyanosis or gangrene Sclera anicteric, throat clear  No jvd or bruits Chest clear bilat to bases, no rales/ wheezing RRR no RG Abd soft ntnd no mass or ascites +bs GU defer MS no joint effusions or deformity Ext no LE or UE edema, no wounds or ulcers Neuro is alert, Ox 3 , nf    LUA AVF +bruit       Renal-related home meds: - dialyvite every day - hydrlazine 75 tid - renvela 800 ac tid - norvasc 10 every day - sensipar 60mg  hs    OP HD: [facility] MWF   3.5h   350/500   46.7kg   2/2.5 bath  AVF  Heparin none - last OP HD 9/30, post wt 47.8kg - hectorol 3 mcg - mircera 75 mcg q 2 , last 9/13, due 9/27   Assessment/ Plan: GI bleed - w/ rectal bleeding and abd pain. 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 GI consulting. Per pmd.  ESRD - on HD MWF. Next HD Wed.  ABL anemia - as above HTN - bp's controlled. Per pmd Volume - came off at dry wt yesterday. UF 1.5 - 2 L next HD.  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 Missed last esa on 9/27. Will start darbe 75 mcg weekly sq while here.  MBD ckd - CCa in range. Cont IV vdra three times per week, and renvela as binder when eating.  Chronic pancreatitis      Vinson Moselle  MD [facility] 03/10/2023, 2:30 PM  Recent Labs  Lab 03/05/23 1248 03/10/23 0400  HGB 9.8* 7.7*  ALBUMIN 3.7 3.1*  CALCIUM 8.5* 7.7*  CREATININE 6.27* 7.45*  K 4.3 4.3   Inpatient medications:  amLODipine  10 mg Oral Daily   atorvastatin  20 mg Oral QHS   cinacalcet  60 mg Oral QPM   gabapentin  200 mg Oral QHS   pantoprazole  40 mg Oral Daily    acetaminophen **OR** acetaminophen, albuterol, hydrALAZINE, HYDROmorphone (DILAUDID) injection, ondansetron **OR** ondansetron (ZOFRAN) IV
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
## 2023-03-11 DIAGNOSIS — D500 Iron deficiency anemia secondary to blood loss (chronic): Secondary | ICD-10-CM

## 2023-03-11 DIAGNOSIS — K922 Gastrointestinal hemorrhage, unspecified: Secondary | ICD-10-CM | POA: Diagnosis not present

## 2023-03-11 DIAGNOSIS — K625 Hemorrhage of anus and rectum: Secondary | ICD-10-CM | POA: Diagnosis not present

## 2023-03-11 LAB — CBC
HCT: 21.2 % — ABNORMAL LOW (ref 36.0–46.0)
Hemoglobin: 6.6 g/dL — CL (ref 12.0–15.0)
MCH: 28.1 pg (ref 26.0–34.0)
MCHC: 31.1 g/dL (ref 30.0–36.0)
MCV: 90.2 fL (ref 80.0–100.0)
Platelets: 252 10*3/uL (ref 150–400)
RBC: 2.35 MIL/uL — ABNORMAL LOW (ref 3.87–5.11)
RDW: 20.7 % — ABNORMAL HIGH (ref 11.5–15.5)
WBC: 4.3 10*3/uL (ref 4.0–10.5)
nRBC: 0 % (ref 0.0–0.2)

## 2023-03-11 LAB — HEPATITIS B SURFACE ANTIBODY, QUANTITATIVE: Hep B S AB Quant (Post): 71.5 m[IU]/mL

## 2023-03-11 LAB — BASIC METABOLIC PANEL
Anion gap: 14 (ref 5–15)
BUN: 25 mg/dL — ABNORMAL HIGH (ref 8–23)
CO2: 27 mmol/L (ref 22–32)
Calcium: 6.7 mg/dL — ABNORMAL LOW (ref 8.9–10.3)
Chloride: 98 mmol/L (ref 98–111)
Creatinine, Ser: 7.64 mg/dL — ABNORMAL HIGH (ref 0.44–1.00)
GFR, Estimated: 5 mL/min — ABNORMAL LOW (ref >60)
Glucose, Bld: 84 mg/dL (ref 70–99)
Potassium: 4 mmol/L (ref 3.5–5.1)
Sodium: 139 mmol/L (ref 135–145)

## 2023-03-11 LAB — PREPARE RBC (CROSSMATCH)

## 2023-03-11 MED ORDER — SODIUM CHLORIDE 0.9% IV SOLUTION: Freq: Once | INTRAVENOUS | Status: AC

## 2023-03-11 MED ORDER — PEG-KCL-NACL-NASULF-NA ASC-C 100 G PO SOLR
0.5000 | Freq: Once | ORAL | Status: DC
  Filled 2023-03-11: qty 1

## 2023-03-11 MED ORDER — HYDRALAZINE HCL 50 MG PO TABS
75.0000 mg | ORAL_TABLET | Freq: Three times a day (TID) | ORAL | Status: DC
  Administered 2023-03-11 – 2023-03-12 (×2): 75 mg via ORAL
  Filled 2023-03-11 (×2): qty 1

## 2023-03-11 MED ORDER — PEG-KCL-NACL-NASULF-NA ASC-C 100 G PO SOLR
1.0000 | Freq: Once | ORAL | Status: DC
Start: 2023-03-11 — End: 2023-03-11

## 2023-03-11 NOTE — Progress Notes (Signed)
PROGRESS NOTE  Ashley [name]  DOB: 12-Dec-1950  PCP: Raymon Mutton., FNP ZOX:[id number]  DOA: 03/10/2023  LOS: 1 day  Hospital Day: 2  Brief narrative: Ashley [name] is a 72 y.o. female with PMH significant for ESRD-HD-MWF, HTN, HLD, CVA, chronic pancreatitis with exocrine pancreatic insufficiency, duodenal AVMs, remote perforated PUD, h/o polysubstance abuse, treated HCV. Patient presented to the ED last night with complaint of abdominal pain and rectal bleeding. Of note, patient has history of GI bleeding.  She was hospitalized in May 2024 and underwent EGD by Dr. Myrtie Neither, noted to have a single angiectasia in duodenal bulb s/p APC. She is a dialysis patient and supposed to undergo dialysis MWF. She gets erythropoietin intermittently at dialysis. Last dialysis was Wednesday 9/25 last week.    9/26, she was seen in the ED after a small volume BRBPR.  Hemoglobin was 9.8 at the time.  CT abdomen was unremarkable and was sent to home. At home, patient reports she subsequently had multiple large volume BRBPR with only small amount of stool.  Because of her symptoms and weakness, she missed 2 sessions since then.  And hence presented to ED last night.  In the ED, patient was afebrile, blood pressure 163/66, breathing on room air Labs showed WC count 5.4, hemoglobin 7.7, BN/creatinine 25/7.45, potassium 4.3 FOBT positive CT abdomen pelvis unremarkable. In the ED, she was given IV pain meds, IV hydralazine Admitted to [facility] GI was consulted  Subjective: Patient was seen and examined this morning in dialysis. Lying on bed.  Not in distress. Labs from later this morning showed hemoglobin down to 6.6.  Monitor PRBC transfusion Pending EGD/colonoscopy tomorrow.  Assessment/Plan: Acute GI bleeding H/o GI bleeding - duodenal AVMs, remote perforated PUD, Presented with multiple episode of BRBPR Noted to have hemoglobin lower than baseline. GI consulted  Tentatively plan for EGD and colonoscopy  tomorrow. Currently on clear liquid diet.  N.p.o. after midnight.  Acute on chronic blood loss anemia Hemoglobin at baseline close to 8.  Hemoglobin dropped down to 6.6 today.  1 unit of PRBC transfusion was given Protonix to continue Recent Labs    09/06/22 0844 10/20/22 1700 10/22/22 0900 10/23/22 0033 03/05/23 1248 03/10/23 0400 03/11/23 0848  HGB  --    < > 7.5* 8.3* 9.8* 7.7* 6.6*  MCV  --    < > 90.0 92.9 90.6 89.3 90.2  FERRITIN 1,296*  --   --   --   --   --   --   TIBC 158*  --   --   --   --   --   --   IRON 31  --   --   --   --   --   --    < > = values in this interval not displayed.   ESRD-HD-MWF Nephrology consulted.  Underwent dialysis this morning Continue Sensipar, Renvela  HTN PTA meds- amlodipine 10 mg daily, hydralazine 75 mg 3 times daily Currently continued on amlodipine.  Blood pressure running elevated.  Resume hydralazine today.  Continue to monitor blood pressure.  IV hydralazine as needed.  CVA/HLD PTA meds-continue Lipitor 20 mg daily  chronic pancreatitis with exocrine pancreatic insufficiency Supposed to be on Creon, ?? Not taking   Polyneuropathy Continue Neurontin nightly  h/o polysubstance abuse treated HCV Reports currently not using drugs.  Follow-up as an outpatient for HCV  Mobility: Lives at home with her daughter.  Able to ambulate independently.  Goals of  care   Code Status: Full Code    DVT prophylaxis:  SCDs Start: 03/10/23 0820   Antimicrobials: None Fluid: None Consultants: GI, nephro Family Communication: None at bedside  Status: Inpatient Level of care:  Telemetry Medical   Patient is from: Home Needs to continue in-hospital care: Pending EGD/colonoscopy tomorrow Anticipated d/c to: Pending clinical course      Diet:  Diet Order             Diet NPO time specified Except for: Sips with Meds  Diet effective ____           Diet clear liquid Room service appropriate? Yes; Fluid consistency: Thin   Diet effective now                   Scheduled Meds:  sodium chloride   Intravenous Once   amLODipine  10 mg Oral Daily   atorvastatin  20 mg Oral QHS   Chlorhexidine Gluconate Cloth  6 each Topical Q0600   cinacalcet  60 mg Oral Q supper   darbepoetin (ARANESP) injection - DIALYSIS  60 mcg Subcutaneous Q Tue-1800   doxercalciferol  3 mcg Intravenous Q M,W,F-HD   gabapentin  200 mg Oral QHS   hydrALAZINE  75 mg Oral Q8H   pantoprazole  40 mg Oral Daily   sevelamer carbonate  800 mg Oral TID WC    PRN meds: acetaminophen **OR** acetaminophen, albuterol, hydrALAZINE, HYDROmorphone (DILAUDID) injection, ondansetron **OR** ondansetron (ZOFRAN) IV   Infusions:    Antimicrobials: Anti-infectives (From admission, onward)    None       Objective: Vitals:   03/11/23 1136 03/11/23 1451  BP: (!) 171/75 129/68  Pulse: 90 82  Resp: 18 18  Temp: 98.3 F (36.8 C) 98.2 F (36.8 C)  SpO2: 100% 100%   No intake or output data in the 24 hours ending 03/11/23 1654 Filed Weights   03/10/23 0305 03/11/23 1103  Weight: 47 kg 45.6 kg   Weight change:  Body mass index is 18.39 kg/m.   Physical Exam: General exam: Pleasant, elderly African-American female.  Not in physical distress Skin: No rashes, lesions or ulcers. HEENT: Atraumatic, normocephalic, no obvious bleeding Lungs: Clear to auscultation bilaterally CVS: Regular rate and rhythm, no murmur GI/Abd soft, nontender, nondistended, bowel sound present CNS: Alert, awake, oriented x 3 Psychiatry: Mood appropriate Extremities: No pedal edema, no calf tenderness  Data Review: I have personally reviewed the laboratory data and studies available.  F/u labs ordered Unresulted Labs (From admission, onward)     Start     Ordered   03/12/23 0500  CBC with Differential/Platelet  Daily,   R      03/11/23 1654   03/12/23 0500  Basic metabolic panel  Daily,   R      03/11/23 1654            Total time spent in review  of labs and imaging, patient evaluation, formulation of plan, documentation and communication with family: 45 minutes  Signed, Lorin Glass, MD [facility] 03/11/2023

## 2023-03-11 NOTE — Progress Notes (Signed)
Progress Note   Assessment    72 year old patient with a history of GI bleed due to small bowel angioectasias, prior duodenitis, remote PUD, exocrine pancreatic insufficiency, hypertension, ESRD on dialysis Monday Wednesday Friday, previously successfully treated hepatitis C with SVR admitted with rectal bleeding and recurrent anemia -- Last endoscopic evaluation May 2024; about enteroscopy; duodenal bulb angioectasia ablated with APC   Recommendations   1.  Recurrent IDA anemia with this time rectal bleeding --further decrease in hemoglobin to 6.6, additional packed red cell transfusion -- Repeat blood transfusion today -- Upper endoscopy and colonoscopy tomorrow with Dr. Barron Alvine  2. IDA --replace iron per renal team  3.  ESRD --hemodialysis Monday Wednesday and Fridays   Chief Complaint   Patient had a bowel movement after many prep last night Tolerating clear liquids Preparing for upper and lower endoscopy tomorrow  Vital signs in last 24 hours: Temp:  [97.7 F (36.5 C)-99.1 F (37.3 C)] 98.1 F (36.7 C) (10/02 1930) Pulse Rate:  [73-90] 79 (10/02 1930) Resp:  [12-22] 18 (10/02 1930) BP: (109-180)/(47-91) 137/70 (10/02 1930) SpO2:  [81 %-100 %] 100 % (10/02 1930) Weight:  [45.6 kg] 45.6 kg (10/02 1103) Last BM Date : 03/11/23  General: Alert, well-developed, in NAD Heart:  Regular rate and rhythm; no murmurs Chest: Clear to ascultation bilaterally Abdomen:  Soft, nontender and nondistended. Normal bowel sounds, without guarding, and without rebound.   Extremities:  Without edema. Neurologic:  Alert and  oriented x4; grossly normal neurologically. Psych:  Alert and cooperative. Normal mood and affect.  Intake/Output from previous day: 10/01 0701 - 10/02 0700 In: 240 [P.O.:240] Out: -  Intake/Output this shift: Total I/O In: 346.7 [Blood:346.7] Out: -   Lab Results: Recent Labs    03/10/23 0400 03/11/23 0848  WBC 5.4 4.3  HGB 7.7* 6.6*  HCT  24.2* 21.2*  PLT 290 252   BMET Recent Labs    03/10/23 0400 03/11/23 0848  NA 142 139  K 4.3 4.0  CL 96* 98  CO2 28 27  GLUCOSE 89 84  BUN 25* 25*  CREATININE 7.45* 7.64*  CALCIUM 7.7* 6.7*   LFT Recent Labs    03/10/23 0400  PROT 6.5  ALBUMIN 3.1*  AST 23  ALT 12  ALKPHOS 52  BILITOT 0.4   PT/INR Recent Labs    03/10/23 0637  LABPROT 14.5  INR 1.1   Hepatitis Panel Recent Labs    03/10/23 1654  HEPBSAG NON REACTIVE    Studies/Results: CT ABDOMEN PELVIS WO CONTRAST  Result Date: 03/10/2023 CLINICAL DATA:  Acute abdominal pain with rectal bleeding EXAM: CT ABDOMEN AND PELVIS WITHOUT CONTRAST TECHNIQUE: Multidetector CT imaging of the abdomen and pelvis was performed following the standard protocol without IV contrast. RADIATION DOSE REDUCTION: This exam was performed according to the departmental dose-optimization program which includes automated exposure control, adjustment of the mA and/or kV according to patient size and/or use of iterative reconstruction technique. COMPARISON:  03/05/2023 FINDINGS: Lower chest: Small pleural effusions with scarring or atelectasis at the lower lobes. Atheromatous plaque. Hepatobiliary: No focal liver abnormality.No evidence of biliary obstruction or stone. Pancreas: Unremarkable. Spleen: Unremarkable. Adrenals/Urinary Tract: Negative adrenals. No hydronephrosis or stone. Pronounced renal atrophy. Bilateral simple appearing renal cysts with no follow-up imaging recommended. Unremarkable bladder. Stomach/Bowel: There are a few fluid-filled small bowel loops in the lower abdomen and pelvis but no specific obstructive pattern or clear bowel wall thickening. No appendicitis. Hemoclip appearance at the peri-pyloric region. Vascular/Lymphatic: No  acute vascular abnormality. Extensive atheromatous plaque affecting the aorta and iliacs. No mass or adenopathy. Reproductive:No acute finding Other: No ascites or pneumoperitoneum. Musculoskeletal:  Degenerative changes without acute finding. IMPRESSION: 1. No acute finding or specific cause for symptoms. 2. Small pleural effusions. 3. Extensive atherosclerosis. Electronically Signed   By: Tiburcio Pea M.D.   On: 03/10/2023 05:57      LOS: 1 day   Beverley Fiedler, MD 03/11/2023, 8:04 PM See Loretha Stapler, [facility], to contact our on call provider

## 2023-03-11 NOTE — Progress Notes (Signed)
Pt receives out-pt HD at [facility] on MWF 5:55 am chair time. Will assist as needed.   Olivia Canter Renal Navigator [phone]

## 2023-03-11 NOTE — Plan of Care (Signed)
  Problem: Education: Goal: Knowledge of General Education information will improve Description: Including pain rating scale, medication(s)/side effects and non-pharmacologic comfort measures Outcome: Progressing   Problem: Clinical Measurements: Goal: Ability to maintain clinical measurements within normal limits will improve Outcome: Progressing   Problem: Pain Managment: Goal: General experience of comfort will improve Outcome: Progressing   

## 2023-03-11 NOTE — Progress Notes (Signed)
   03/11/23 1103  Vitals  Temp 98 F (36.7 C)  Pulse Rate 85  Resp (!) 22  BP (!) 164/75  SpO2 98 %  O2 Device Nasal Cannula  Weight 45.6 kg  Oxygen Therapy  O2 Flow Rate (L/min) 3 L/min  Post Treatment  Dialyzer Clearance Clear  Tolerated HD Treatment Yes  AVG/AVF Arterial Site Held (minutes) 10 minutes  AVG/AVF Venous Site Held (minutes) 10 minutes   Received patient in bed to unit.  Alert and oriented.  Informed consent signed and in chart.   TX duration:3  Patient tolerated well.  Transported back to the room  Alert, without acute distress.  Hand-off given to patient's nurse.   Access used: Yes Access issues: No  Total UF removed: unable to document , due to malfunction in the Tablo machine X2 set-up and pt refused to go back on machine, sign AMA, pt had 1 hr 27 mins left on treatment. Medication(s) given: See MAR Post HD VS: See Above Grid Post HD weight: 45.6 kg   [facility]

## 2023-03-11 NOTE — Progress Notes (Signed)
[facility] Progress Note   Subjective:   Patient seen and examined at dialysis.  Infiltrated and decided to come off early.  States she can't sit still right now.  Admits to ongoing BRBPR and abdominal pain. States she was SOB earlier but now improving.  Denies CP, edema, orthopnea, and n/v.   Objective Vitals:   03/11/23 1030 03/11/23 1100 03/11/23 1103 03/11/23 1136  BP: (!) 174/70 (!) 180/91 (!) 164/75 (!) 171/75  Pulse: 83 82 85 90  Resp: (!) 22 20 (!) 22 18  Temp:   98 F (36.7 C) 98.3 F (36.8 C)  TempSrc:    Oral  SpO2: 100% 100% 98% 100%  Weight:   45.6 kg   Height:       Physical Exam General:chronically ill appearing female in NAD Heart:RRR, no mrg Lungs:CTAB, nml WOB Abdomen:soft, +tenderness, mildly distended  Extremities:no LE edema Dialysis Access: LU AVF +b/t   Filed Weights   03/10/23 0305 03/11/23 1103  Weight: 47 kg 45.6 kg   No intake or output data in the 24 hours ending 03/11/23 1304  Additional Objective Labs: Basic Metabolic Panel: Recent Labs  Lab 03/05/23 1248 03/10/23 0400 03/10/23 1654 03/11/23 0848  NA 140 142  --  139  K 4.3 4.3  --  4.0  CL 99 96*  --  98  CO2 26 28  --  27  GLUCOSE 85 89  --  84  BUN 27* 25*  --  25*  CREATININE 6.27* 7.45*  --  7.64*  CALCIUM 8.5* 7.7*  --  6.7*  PHOS  --   --  3.9  --    Liver Function Tests: Recent Labs  Lab 03/05/23 1248 03/10/23 0400  AST 24 23  ALT 14 12  ALKPHOS 56 52  BILITOT 0.6 0.4  PROT 7.1 6.5  ALBUMIN 3.7 3.1*   Recent Labs  Lab 03/05/23 1248 03/10/23 0400  LIPASE 117* 52*   CBC: Recent Labs  Lab 03/05/23 1248 03/10/23 0400 03/11/23 0848  WBC 5.0 5.4 4.3  NEUTROABS 2.5 3.4  --   HGB 9.8* 7.7* 6.6*  HCT 30.8* 24.2* 21.2*  MCV 90.6 89.3 90.2  PLT 249 290 252   Studies/Results: CT ABDOMEN PELVIS WO CONTRAST  Result Date: 03/10/2023 CLINICAL DATA:  Acute abdominal pain with rectal bleeding EXAM: CT ABDOMEN AND PELVIS WITHOUT CONTRAST  TECHNIQUE: Multidetector CT imaging of the abdomen and pelvis was performed following the standard protocol without IV contrast. RADIATION DOSE REDUCTION: This exam was performed according to the departmental dose-optimization program which includes automated exposure control, adjustment of the mA and/or kV according to patient size and/or use of iterative reconstruction technique. COMPARISON:  03/05/2023 FINDINGS: Lower chest: Small pleural effusions with scarring or atelectasis at the lower lobes. Atheromatous plaque. Hepatobiliary: No focal liver abnormality.No evidence of biliary obstruction or stone. Pancreas: Unremarkable. Spleen: Unremarkable. Adrenals/Urinary Tract: Negative adrenals. No hydronephrosis or stone. Pronounced renal atrophy. Bilateral simple appearing renal cysts with no follow-up imaging recommended. Unremarkable bladder. Stomach/Bowel: There are a few fluid-filled small bowel loops in the lower abdomen and pelvis but no specific obstructive pattern or clear bowel wall thickening. No appendicitis. Hemoclip appearance at the peri-pyloric region. Vascular/Lymphatic: No acute vascular abnormality. Extensive atheromatous plaque affecting the aorta and iliacs. No mass or adenopathy. Reproductive:No acute finding Other: No ascites or pneumoperitoneum. Musculoskeletal: Degenerative changes without acute finding. IMPRESSION: 1. No acute finding or specific cause for symptoms. 2. Small pleural effusions. 3. Extensive atherosclerosis. Electronically  Signed   By: Tiburcio Pea M.D.   On: 03/10/2023 05:57    Medications:   amLODipine  10 mg Oral Daily   atorvastatin  20 mg Oral QHS   Chlorhexidine Gluconate Cloth  6 each Topical Q0600   cinacalcet  60 mg Oral Q supper   darbepoetin (ARANESP) injection - DIALYSIS  60 mcg Subcutaneous Q Tue-1800   doxercalciferol  3 mcg Intravenous Q M,W,F-HD   gabapentin  200 mg Oral QHS   pantoprazole  40 mg Oral Daily   sevelamer carbonate  800 mg Oral TID  WC    Dialysis Orders:  3.5h   350/500   46.7kg   2/2.5 bath  AVF  Heparin none - last OP HD 9/30, post wt 47.8kg - hectorol 3 mcg - mircera 75 mcg q 2 , last 9/13, due 9/27     Assessment/ Plan: GI bleed - w/ rectal bleeding and abd pain. 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 EGD and colonoscopy. Per pmd.  Acute on chronic Anemia ESRD - 2/2 #1. Hgb drop 6.6 this AM.  1 unit pRBC ordered. Missed last esa on 9/27. Will start darbe 75 mcg weekly sq while here.  ESRD - on HD MWF. Next HD 10/4. Historically non compliant with full treatment and typically signs off early.  HTN - BP elevated today.  Continue home meds.  Volume - Does not appear volume overloaded.  On 2L O2.  UF as tolerated.   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 Cont IV vdra three times per week, and renvela as binder when eating.  Chronic pancreatitis   Virgina Norfolk, PA-C [facility] 03/11/2023,1:04 PM  LOS: 1 day

## 2023-03-11 NOTE — H&P (View-Only) (Signed)
Progress Note   Assessment    72 year old patient with a history of GI bleed due to small bowel angioectasias, prior duodenitis, remote PUD, exocrine pancreatic insufficiency, hypertension, ESRD on dialysis Monday Wednesday Friday, previously successfully treated hepatitis C with SVR admitted with rectal bleeding and recurrent anemia -- Last endoscopic evaluation May 2024; about enteroscopy; duodenal bulb angioectasia ablated with APC   Recommendations   1.  Recurrent IDA anemia with this time rectal bleeding --further decrease in hemoglobin to 6.6, additional packed red cell transfusion -- Repeat blood transfusion today -- Upper endoscopy and colonoscopy tomorrow with Dr. Barron Alvine  2. IDA --replace iron per renal team  3.  ESRD --hemodialysis Monday Wednesday and Fridays   Chief Complaint   Patient had a bowel movement after many prep last night Tolerating clear liquids Preparing for upper and lower endoscopy tomorrow  Vital signs in last 24 hours: Temp:  [97.7 F (36.5 C)-99.1 F (37.3 C)] 98.1 F (36.7 C) (10/02 1930) Pulse Rate:  [73-90] 79 (10/02 1930) Resp:  [12-22] 18 (10/02 1930) BP: (109-180)/(47-91) 137/70 (10/02 1930) SpO2:  [81 %-100 %] 100 % (10/02 1930) Weight:  [45.6 kg] 45.6 kg (10/02 1103) Last BM Date : 03/11/23  General: Alert, well-developed, in NAD Heart:  Regular rate and rhythm; no murmurs Chest: Clear to ascultation bilaterally Abdomen:  Soft, nontender and nondistended. Normal bowel sounds, without guarding, and without rebound.   Extremities:  Without edema. Neurologic:  Alert and  oriented x4; grossly normal neurologically. Psych:  Alert and cooperative. Normal mood and affect.  Intake/Output from previous day: 10/01 0701 - 10/02 0700 In: 240 [P.O.:240] Out: -  Intake/Output this shift: Total I/O In: 346.7 [Blood:346.7] Out: -   Lab Results: Recent Labs    03/10/23 0400 03/11/23 0848  WBC 5.4 4.3  HGB 7.7* 6.6*  HCT  24.2* 21.2*  PLT 290 252   BMET Recent Labs    03/10/23 0400 03/11/23 0848  NA 142 139  K 4.3 4.0  CL 96* 98  CO2 28 27  GLUCOSE 89 84  BUN 25* 25*  CREATININE 7.45* 7.64*  CALCIUM 7.7* 6.7*   LFT Recent Labs    03/10/23 0400  PROT 6.5  ALBUMIN 3.1*  AST 23  ALT 12  ALKPHOS 52  BILITOT 0.4   PT/INR Recent Labs    03/10/23 0637  LABPROT 14.5  INR 1.1   Hepatitis Panel Recent Labs    03/10/23 1654  HEPBSAG NON REACTIVE    Studies/Results: CT ABDOMEN PELVIS WO CONTRAST  Result Date: 03/10/2023 CLINICAL DATA:  Acute abdominal pain with rectal bleeding EXAM: CT ABDOMEN AND PELVIS WITHOUT CONTRAST TECHNIQUE: Multidetector CT imaging of the abdomen and pelvis was performed following the standard protocol without IV contrast. RADIATION DOSE REDUCTION: This exam was performed according to the departmental dose-optimization program which includes automated exposure control, adjustment of the mA and/or kV according to patient size and/or use of iterative reconstruction technique. COMPARISON:  03/05/2023 FINDINGS: Lower chest: Small pleural effusions with scarring or atelectasis at the lower lobes. Atheromatous plaque. Hepatobiliary: No focal liver abnormality.No evidence of biliary obstruction or stone. Pancreas: Unremarkable. Spleen: Unremarkable. Adrenals/Urinary Tract: Negative adrenals. No hydronephrosis or stone. Pronounced renal atrophy. Bilateral simple appearing renal cysts with no follow-up imaging recommended. Unremarkable bladder. Stomach/Bowel: There are a few fluid-filled small bowel loops in the lower abdomen and pelvis but no specific obstructive pattern or clear bowel wall thickening. No appendicitis. Hemoclip appearance at the peri-pyloric region. Vascular/Lymphatic: No  acute vascular abnormality. Extensive atheromatous plaque affecting the aorta and iliacs. No mass or adenopathy. Reproductive:No acute finding Other: No ascites or pneumoperitoneum. Musculoskeletal:  Degenerative changes without acute finding. IMPRESSION: 1. No acute finding or specific cause for symptoms. 2. Small pleural effusions. 3. Extensive atherosclerosis. Electronically Signed   By: Tiburcio Pea M.D.   On: 03/10/2023 05:57      LOS: 1 day   Beverley Fiedler, MD 03/11/2023, 8:04 PM See Loretha Stapler, [facility], to contact our on call provider

## 2023-03-12 DIAGNOSIS — K922 Gastrointestinal hemorrhage, unspecified: Secondary | ICD-10-CM | POA: Diagnosis not present

## 2023-03-12 LAB — CBC WITH DIFFERENTIAL/PLATELET
Abs Immature Granulocytes: 0.07 10*3/uL (ref 0.00–0.07)
Basophils Absolute: 0.1 10*3/uL (ref 0.0–0.1)
Basophils Relative: 1 %
Eosinophils Absolute: 0.4 10*3/uL (ref 0.0–0.5)
Eosinophils Relative: 6 %
HCT: 26 % — ABNORMAL LOW (ref 36.0–46.0)
Hemoglobin: 8.9 g/dL — ABNORMAL LOW (ref 12.0–15.0)
Immature Granulocytes: 1 %
Lymphocytes Relative: 27 %
Lymphs Abs: 1.7 10*3/uL (ref 0.7–4.0)
MCH: 30.6 pg (ref 26.0–34.0)
MCHC: 34.2 g/dL (ref 30.0–36.0)
MCV: 89.3 fL (ref 80.0–100.0)
Monocytes Absolute: 0.9 10*3/uL (ref 0.1–1.0)
Monocytes Relative: 14 %
Neutro Abs: 3.1 10*3/uL (ref 1.7–7.7)
Neutrophils Relative %: 51 %
Platelets: 245 10*3/uL (ref 150–400)
RBC: 2.91 MIL/uL — ABNORMAL LOW (ref 3.87–5.11)
RDW: 19.5 % — ABNORMAL HIGH (ref 11.5–15.5)
WBC: 6.1 10*3/uL (ref 4.0–10.5)
nRBC: 0 % (ref 0.0–0.2)

## 2023-03-12 LAB — BPAM RBC
Blood Product Expiration Date: 202410192359
ISSUE DATE / TIME: 202410021724
Unit Type and Rh: 600

## 2023-03-12 LAB — TYPE AND SCREEN
ABO/RH(D): A NEG
Antibody Screen: NEGATIVE
Unit division: 0

## 2023-03-12 LAB — BASIC METABOLIC PANEL
Anion gap: 13 (ref 5–15)
BUN: 19 mg/dL (ref 8–23)
CO2: 28 mmol/L (ref 22–32)
Calcium: 6.6 mg/dL — ABNORMAL LOW (ref 8.9–10.3)
Chloride: 94 mmol/L — ABNORMAL LOW (ref 98–111)
Creatinine, Ser: 6.9 mg/dL — ABNORMAL HIGH (ref 0.44–1.00)
GFR, Estimated: 6 mL/min — ABNORMAL LOW (ref >60)
Glucose, Bld: 82 mg/dL (ref 70–99)
Potassium: 4.3 mmol/L (ref 3.5–5.1)
Sodium: 135 mmol/L (ref 135–145)

## 2023-03-12 MED ORDER — CHLORHEXIDINE GLUCONATE CLOTH 2 % EX PADS: 6.0000 | MEDICATED_PAD | Freq: Every day | CUTANEOUS | Status: DC

## 2023-03-12 MED ORDER — ONDANSETRON HCL 4 MG/2ML IJ SOLN
4.0000 mg | Freq: Four times a day (QID) | INTRAMUSCULAR | Status: AC
  Administered 2023-03-12 – 2023-03-13 (×4): 4 mg via INTRAVENOUS
  Filled 2023-03-12 (×4): qty 2

## 2023-03-12 MED ORDER — PEG-KCL-NACL-NASULF-NA ASC-C 100 G PO SOLR
0.5000 | Freq: Once | ORAL | Status: AC
  Administered 2023-03-12: 100 g via ORAL

## 2023-03-12 MED ORDER — PEG-KCL-NACL-NASULF-NA ASC-C 100 G PO SOLR: 1.0000 | Freq: Once | ORAL | Status: DC

## 2023-03-12 MED ORDER — BISACODYL 5 MG PO TBEC
10.0000 mg | DELAYED_RELEASE_TABLET | Freq: Once | ORAL | Status: AC
  Administered 2023-03-12: 10 mg via ORAL
  Filled 2023-03-12: qty 2

## 2023-03-12 MED ORDER — PEG-KCL-NACL-NASULF-NA ASC-C 100 G PO SOLR
0.5000 | Freq: Once | ORAL | Status: AC
  Administered 2023-03-12: 100 g via ORAL
  Filled 2023-03-12: qty 1

## 2023-03-12 MED ORDER — SODIUM CHLORIDE 0.9 % IV SOLN: INTRAVENOUS | Status: DC

## 2023-03-12 MED ORDER — HYDRALAZINE HCL 50 MG PO TABS
75.0000 mg | ORAL_TABLET | Freq: Three times a day (TID) | ORAL | Status: DC
  Administered 2023-03-12 – 2023-03-14 (×7): 75 mg via ORAL
  Filled 2023-03-12 (×7): qty 1

## 2023-03-12 NOTE — Plan of Care (Signed)
  Problem: Pain Managment: Goal: General experience of comfort will improve Outcome: Progressing   Problem: Safety: Goal: Ability to remain free from injury will improve Outcome: Progressing   

## 2023-03-12 NOTE — Progress Notes (Signed)
PROGRESS NOTE  Ashley [name]  DOB: 26-Sep-1950  PCP: Raymon Mutton., FNP UMP:[id number]  DOA: 03/10/2023  LOS: 2 days  Hospital Day: 3  Brief narrative: Ashley [name] is a 72 y.o. female with PMH significant for ESRD-HD-MWF, HTN, HLD, CVA, chronic pancreatitis with exocrine pancreatic insufficiency, duodenal AVMs, remote perforated PUD, h/o polysubstance abuse, treated HCV. Patient presented to the ED last night with complaint of abdominal pain and rectal bleeding. Of note, patient has history of GI bleeding.  She was hospitalized in May 2024 and underwent EGD by Dr. Myrtie Neither, noted to have a single angiectasia in duodenal bulb s/p APC. She is a dialysis patient and supposed to undergo dialysis MWF. She gets erythropoietin intermittently at dialysis. Last dialysis was Wednesday 9/25 last week.    9/26, she was seen in the ED after a small volume BRBPR.  Hemoglobin was 9.8 at the time.  CT abdomen was unremarkable and was sent to home. At home, patient reports she subsequently had multiple large volume BRBPR with only small amount of stool.  Because of her symptoms and weakness, she missed 2 sessions since then.  And hence presented to ED last night.  In the ED, patient was afebrile, blood pressure 163/66, breathing on room air Labs showed WC count 5.4, hemoglobin 7.7, BN/creatinine 25/7.45, potassium 4.3 FOBT positive CT abdomen pelvis unremarkable. In the ED, she was given IV pain meds, IV hydralazine Admitted to [facility] was consulted  Subjective: Patient was seen and examined this morning. Not in distress.  Reports bowel movement all night.  The bowel prep.  Assessment/Plan: Acute GI bleeding H/o GI bleeding - duodenal AVMs, remote perforated PUD, Presented with multiple episode of BRBPR Noted to have hemoglobin lower than baseline. GI consulted  Tentatively plan for EGD and colonoscopy today. Currently remains n.p.o.  Acute on chronic blood loss anemia Hemoglobin at baseline  close to 8.  Hemoglobin dropped down to 6.6 yesterday.  1 unit of PRBC transfusion was given.  Hemoglobin 8.9 today Protonix to continue Recent Labs    09/06/22 0844 10/20/22 1700 10/23/22 0033 03/05/23 1248 03/10/23 0400 03/11/23 0848 03/12/23 0445  HGB  --    < > 8.3* 9.8* 7.7* 6.6* 8.9*  MCV  --    < > 92.9 90.6 89.3 90.2 89.3  FERRITIN 1,296*  --   --   --   --   --   --   TIBC 158*  --   --   --   --   --   --   IRON 31  --   --   --   --   --   --    < > = values in this interval not displayed.   ESRD-HD-MWF Nephrology consulted.  Underwent dialysis yesterday 10/2 Continue Sensipar, Renvela  HTN PTA meds- amlodipine 10 mg daily, hydralazine 75 mg 3 times daily Currently continued on both.  Blood pressure stable.  Continue to monitor blood pressure.  IV hydralazine as needed.  CVA/HLD PTA meds-continue Lipitor 20 mg daily  chronic pancreatitis with exocrine pancreatic insufficiency Supposed to be on Creon, ?? Not taking   Polyneuropathy Continue Neurontin nightly  h/o polysubstance abuse treated HCV Reports currently not using drugs.  Follow-up as an outpatient for HCV  Mobility: Lives at home with her daughter.  Able to ambulate independently.  Encourage ambulation  Goals of care   Code Status: Full Code    DVT prophylaxis:  SCDs Start: 03/10/23 0820  Antimicrobials: None Fluid: None Consultants: GI, nephro Family Communication: None at bedside  Status: Inpatient Level of care:  Telemetry Medical   Patient is from: Home Needs to continue in-hospital care: Pending EGD/colonoscopy today Anticipated d/c to: Pending clinical course      Diet:  Diet Order             Diet NPO time specified Except for: Sips with Meds  Diet effective midnight           Diet NPO time specified Except for: Sips with Meds  Diet effective ____                   Scheduled Meds:  amLODipine  10 mg Oral Daily   atorvastatin  20 mg Oral QHS   Chlorhexidine  Gluconate Cloth  6 each Topical Q0600   cinacalcet  60 mg Oral Q supper   darbepoetin (ARANESP) injection - DIALYSIS  60 mcg Subcutaneous Q Tue-1800   doxercalciferol  3 mcg Intravenous Q M,W,F-HD   gabapentin  200 mg Oral QHS   hydrALAZINE  75 mg Oral Q8H   pantoprazole  40 mg Oral Daily   peg 3350 powder  0.5 kit Oral Once   And   peg 3350 powder  0.5 kit Oral Once   sevelamer carbonate  800 mg Oral TID WC    PRN meds: acetaminophen **OR** acetaminophen, albuterol, hydrALAZINE, HYDROmorphone (DILAUDID) injection, ondansetron **OR** ondansetron (ZOFRAN) IV   Infusions:   sodium chloride      Antimicrobials: Anti-infectives (From admission, onward)    None       Objective: Vitals:   03/12/23 0545 03/12/23 0759  BP: (!) 128/54 (!) 149/63  Pulse: 77 79  Resp: 18   Temp: 99 F (37.2 C)   SpO2: 90% 93%    Intake/Output Summary (Last 24 hours) at 03/12/2023 1108 Last data filed at 03/11/2023 1920 Gross per 24 hour  Intake 349.34 ml  Output --  Net 349.34 ml   Filed Weights   03/10/23 0305 03/11/23 1103  Weight: 47 kg 45.6 kg   Weight change:  Body mass index is 18.39 kg/m.   Physical Exam: General exam: Pleasant, elderly African-American female.  Not in physical distress Skin: No rashes, lesions or ulcers. HEENT: Atraumatic, normocephalic, no obvious bleeding Lungs: Clear to auscultation bilaterally CVS: Regular rate and rhythm, no murmur GI/Abd soft, mild epigastric tenderness present, nondistended, bowel sound present CNS: Alert, awake, oriented x 3 Psychiatry: Mood appropriate Extremities: No pedal edema, no calf tenderness  Data Review: I have personally reviewed the laboratory data and studies available.  F/u labs ordered Unresulted Labs (From admission, onward)     Start     Ordered   03/12/23 0500  CBC with Differential/Platelet  Daily,   R      03/11/23 1654   03/12/23 0500  Basic metabolic panel  Daily,   R      03/11/23 1654             Total time spent in review of labs and imaging, patient evaluation, formulation of plan, documentation and communication with family: 45 minutes  Signed, Lorin Glass, MD [facility] 03/12/2023

## 2023-03-12 NOTE — Progress Notes (Signed)
   03/12/23 1228  Mobility  Activity Ambulated with assistance to bathroom  Level of Assistance Contact guard assist, steadying assist  Assistive Device None  Distance Ambulated (ft) 20 ft  Activity Response Tolerated fair  Mobility Referral Yes  $Mobility charge 1 Mobility  Mobility Specialist Start Time (ACUTE ONLY) 1205  Mobility Specialist Stop Time (ACUTE ONLY) 1225  Mobility Specialist Time Calculation (min) (ACUTE ONLY) 20 min   Mobility Specialist: Progress Note  Pt agreeable to mobility session - received in Bed. Required CG with no AD and unsteady gait. C/o stomach pain. Returned to chair with all needs met - call bell within reach. Chair alarm on.   Pt with black/ dark brown stool in brief. Pericare completed independently.   Barnie Mort, BS Mobility Specialist Please contact via SecureChat or Rehab office at [phone].

## 2023-03-12 NOTE — Progress Notes (Signed)
   03/12/23 1458  Mobility  Activity Ambulated with assistance to bathroom  Level of Assistance Contact guard assist, steadying assist  Assistive Device None;Other (Comment) (BR Rails)  Distance Ambulated (ft) 20 ft  Activity Response Tolerated fair  Mobility Referral Yes  $Mobility charge 1 Mobility  Mobility Specialist Start Time (ACUTE ONLY) 1415  Mobility Specialist Stop Time (ACUTE ONLY) 1440  Mobility Specialist Time Calculation (min) (ACUTE ONLY) 25 min   Mobility Specialist: Progress Note  Post-Mobility: HR 84, BP 140/70 (91), SpO2 97 % RA  Pt agreeable to mobility session - received in chair. Required CG using RW with unsteady gait. Pt used railings in bathroom for STS. C/o dizziness and stomach pain rated 8/10 - RN notified. Returned to bed with all needs met - call bell within reach. Bed alarm on.   Pt with black/ dark brown stool in brief. More unsteady this session, close CG required.    Barnie Mort, BS Mobility Specialist Please contact via SecureChat or Rehab office at [phone].

## 2023-03-12 NOTE — Plan of Care (Addendum)
[facility] Progress Note   Subjective:   Patient seen and examined at bedside.  Has not noticed any additional bleeding overnight. Admits to BM all night following prep.  Denies CP, SOB, nausea and vomiting.   Objective Vitals:   03/11/23 1930 03/12/23 0114 03/12/23 0545 03/12/23 0759  BP: 137/70 135/62 (!) 128/54 (!) 149/63  Pulse: 79  77 79  Resp: 18  18   Temp: 98.1 F (36.7 C)  99 F (37.2 C)   TempSrc: Oral  Oral   SpO2: 100%  90% 93%  Weight:      Height:       Physical Exam General:chronically ill appearing female in NAD Heart:RRR, no mrg Lungs:+scattered rhonchi, nml WOB on RA Abdomen:soft, mildly tender, ND Extremities:no LE edema Dialysis Access: LU AVF +b/t   Filed Weights   03/10/23 0305 03/11/23 1103  Weight: 47 kg 45.6 kg    Intake/Output Summary (Last 24 hours) at 03/12/2023 1211 Last data filed at 03/12/2023 1200 Gross per 24 hour  Intake 349.34 ml  Output --  Net 349.34 ml    Additional Objective Labs: Basic Metabolic Panel: Recent Labs  Lab 03/10/23 0400 03/10/23 1654 03/11/23 0848 03/12/23 0445  NA 142  --  139 135  K 4.3  --  4.0 4.3  CL 96*  --  98 94*  CO2 28  --  27 28  GLUCOSE 89  --  84 82  BUN 25*  --  25* 19  CREATININE 7.45*  --  7.64* 6.90*  CALCIUM 7.7*  --  6.7* 6.6*  PHOS  --  3.9  --   --    Liver Function Tests: Recent Labs  Lab 03/05/23 1248 03/10/23 0400  AST 24 23  ALT 14 12  ALKPHOS 56 52  BILITOT 0.6 0.4  PROT 7.1 6.5  ALBUMIN 3.7 3.1*   Recent Labs  Lab 03/05/23 1248 03/10/23 0400  LIPASE 117* 52*   CBC: Recent Labs  Lab 03/05/23 1248 03/10/23 0400 03/11/23 0848 03/12/23 0445  WBC 5.0 5.4 4.3 6.1  NEUTROABS 2.5 3.4  --  3.1  HGB 9.8* 7.7* 6.6* 8.9*  HCT 30.8* 24.2* 21.2* 26.0*  MCV 90.6 89.3 90.2 89.3  PLT 249 290 252 245   No results found.  Medications:  sodium chloride      amLODipine  10 mg Oral Daily   atorvastatin  20 mg Oral QHS   Chlorhexidine Gluconate  Cloth  6 each Topical Q0600   cinacalcet  60 mg Oral Q supper   darbepoetin (ARANESP) injection - DIALYSIS  60 mcg Subcutaneous Q Tue-1800   doxercalciferol  3 mcg Intravenous Q M,W,F-HD   gabapentin  200 mg Oral QHS   hydrALAZINE  75 mg Oral Q8H   pantoprazole  40 mg Oral Daily   peg 3350 powder  0.5 kit Oral Once   And   peg 3350 powder  0.5 kit Oral Once   sevelamer carbonate  800 mg Oral TID WC    Dialysis Orders:  3.5h   350/500   46.7kg   2/2.5 bath  AVF  Heparin none - last OP HD 9/30, post wt 47.8kg - hectorol 3 mcg - mircera 75 mcg q 2 , last 9/13, due 9/27     Assessment/ Plan: GI bleed - w/ rectal bleeding and abd pain. 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 EGD and colonoscopy planned for today. Per pmd.  Acute on chronic Anemia ESRD - 2/2 #1. Hgb drop  6.6 and improved to 8.9 s/p 1unit pRBC yesterday. Missed last esa on 9/27. Aranesp given on 10/1, continue weekly while here.  ESRD - on HD MWF. Next HD 10/4. Historically non compliant with full treatment and typically signs off early.  HTN - BP mostly in goal today. Continue home meds.  Volume - Does not appear volume overloaded.  No longer on O2. UF as tolerated.   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 Cont IV vdra three times per week, and renvela as binder when eating.  Chronic pancreatitis   Virgina Norfolk, PA-C [facility] 03/12/2023,12:11 PM  LOS: 2 days   Pt seen, examined and agree w A/P as above.  Vinson Moselle MD  [facility] 03/16/2023, 1:12 PM

## 2023-03-13 ENCOUNTER — Encounter (HOSPITAL_COMMUNITY): Payer: Self-pay | Admitting: Internal Medicine

## 2023-03-13 ENCOUNTER — Inpatient Hospital Stay (HOSPITAL_COMMUNITY): Payer: 59 | Admitting: Anesthesiology

## 2023-03-13 ENCOUNTER — Encounter (HOSPITAL_COMMUNITY)
Admission: EM | Disposition: A | Discharge status: Home / Self Care | Payer: Self-pay | Source: Home / Self Care | Attending: Internal Medicine

## 2023-03-13 DIAGNOSIS — D124 Benign neoplasm of descending colon: Secondary | ICD-10-CM | POA: Diagnosis not present

## 2023-03-13 DIAGNOSIS — K2950 Unspecified chronic gastritis without bleeding: Secondary | ICD-10-CM

## 2023-03-13 DIAGNOSIS — N189 Chronic kidney disease, unspecified: Secondary | ICD-10-CM

## 2023-03-13 DIAGNOSIS — D126 Benign neoplasm of colon, unspecified: Secondary | ICD-10-CM

## 2023-03-13 DIAGNOSIS — K922 Gastrointestinal hemorrhage, unspecified: Secondary | ICD-10-CM | POA: Diagnosis not present

## 2023-03-13 DIAGNOSIS — D122 Benign neoplasm of ascending colon: Secondary | ICD-10-CM | POA: Diagnosis not present

## 2023-03-13 DIAGNOSIS — I130 Hypertensive heart and chronic kidney disease with heart failure and stage 1 through stage 4 chronic kidney disease, or unspecified chronic kidney disease: Secondary | ICD-10-CM | POA: Diagnosis not present

## 2023-03-13 DIAGNOSIS — K640 First degree hemorrhoids: Secondary | ICD-10-CM

## 2023-03-13 DIAGNOSIS — I509 Heart failure, unspecified: Secondary | ICD-10-CM

## 2023-03-13 DIAGNOSIS — K317 Polyp of stomach and duodenum: Secondary | ICD-10-CM

## 2023-03-13 DIAGNOSIS — D125 Benign neoplasm of sigmoid colon: Secondary | ICD-10-CM

## 2023-03-13 DIAGNOSIS — D509 Iron deficiency anemia, unspecified: Secondary | ICD-10-CM | POA: Diagnosis not present

## 2023-03-13 DIAGNOSIS — K3189 Other diseases of stomach and duodenum: Secondary | ICD-10-CM

## 2023-03-13 HISTORY — PX: COLONOSCOPY WITH PROPOFOL: SHX5780

## 2023-03-13 HISTORY — PX: ESOPHAGOGASTRODUODENOSCOPY (EGD) WITH PROPOFOL: SHX5813

## 2023-03-13 HISTORY — PX: BIOPSY: SHX5522

## 2023-03-13 HISTORY — PX: POLYPECTOMY: SHX5525

## 2023-03-13 HISTORY — PX: HEMOSTASIS CLIP PLACEMENT: SHX6857

## 2023-03-13 LAB — BASIC METABOLIC PANEL
Anion gap: 17 — ABNORMAL HIGH (ref 5–15)
BUN: 23 mg/dL (ref 8–23)
CO2: 23 mmol/L (ref 22–32)
Calcium: 6.8 mg/dL — ABNORMAL LOW (ref 8.9–10.3)
Chloride: 98 mmol/L (ref 98–111)
Creatinine, Ser: 8.79 mg/dL — ABNORMAL HIGH (ref 0.44–1.00)
GFR, Estimated: 4 mL/min — ABNORMAL LOW (ref >60)
Glucose, Bld: 96 mg/dL (ref 70–99)
Potassium: 4.7 mmol/L (ref 3.5–5.1)
Sodium: 138 mmol/L (ref 135–145)

## 2023-03-13 LAB — CBC WITH DIFFERENTIAL/PLATELET
Abs Immature Granulocytes: 0.03 10*3/uL (ref 0.00–0.07)
Basophils Absolute: 0 10*3/uL (ref 0.0–0.1)
Basophils Relative: 1 %
Eosinophils Absolute: 0.3 10*3/uL (ref 0.0–0.5)
Eosinophils Relative: 5 %
HCT: 28.4 % — ABNORMAL LOW (ref 36.0–46.0)
Hemoglobin: 9.5 g/dL — ABNORMAL LOW (ref 12.0–15.0)
Immature Granulocytes: 1 %
Lymphocytes Relative: 24 %
Lymphs Abs: 1.5 10*3/uL (ref 0.7–4.0)
MCH: 30.2 pg (ref 26.0–34.0)
MCHC: 33.5 g/dL (ref 30.0–36.0)
MCV: 90.2 fL (ref 80.0–100.0)
Monocytes Absolute: 0.9 10*3/uL (ref 0.1–1.0)
Monocytes Relative: 13 %
Neutro Abs: 3.8 10*3/uL (ref 1.7–7.7)
Neutrophils Relative %: 56 %
Platelets: 304 10*3/uL (ref 150–400)
RBC: 3.15 MIL/uL — ABNORMAL LOW (ref 3.87–5.11)
RDW: 19.4 % — ABNORMAL HIGH (ref 11.5–15.5)
WBC: 6.6 10*3/uL (ref 4.0–10.5)
nRBC: 0 % (ref 0.0–0.2)

## 2023-03-13 LAB — GLUCOSE, CAPILLARY: Glucose-Capillary: 101 mg/dL — ABNORMAL HIGH (ref 70–99)

## 2023-03-13 SURGERY — COLONOSCOPY WITH PROPOFOL
Anesthesia: Monitor Anesthesia Care

## 2023-03-13 MED ORDER — GLYCOPYRROLATE PF 0.2 MG/ML IJ SOSY
PREFILLED_SYRINGE | INTRAMUSCULAR | Status: DC | PRN
  Administered 2023-03-13: .1 mg via INTRAVENOUS

## 2023-03-13 MED ORDER — SODIUM CHLORIDE 0.9 % IV SOLN: INTRAVENOUS | Status: DC | PRN

## 2023-03-13 MED ORDER — LIDOCAINE-PRILOCAINE 2.5-2.5 % EX CREA: 1.0000 | TOPICAL_CREAM | CUTANEOUS | Status: DC | PRN

## 2023-03-13 MED ORDER — ALTEPLASE 2 MG IJ SOLR: 2.0000 mg | Freq: Once | INTRAMUSCULAR | Status: DC | PRN

## 2023-03-13 MED ORDER — HEPARIN SODIUM (PORCINE) 1000 UNIT/ML DIALYSIS: 1000.0000 [IU] | INTRAMUSCULAR | Status: DC | PRN

## 2023-03-13 MED ORDER — MELATONIN 3 MG PO TABS
3.0000 mg | ORAL_TABLET | Freq: Every evening | ORAL | Status: DC | PRN
  Administered 2023-03-13: 3 mg via ORAL
  Filled 2023-03-13: qty 1

## 2023-03-13 MED ORDER — LIDOCAINE 2% (20 MG/ML) 5 ML SYRINGE
INTRAMUSCULAR | Status: DC | PRN
  Administered 2023-03-13: 50 mg via INTRAVENOUS

## 2023-03-13 MED ORDER — PROPOFOL 10 MG/ML IV BOLUS
INTRAVENOUS | Status: DC | PRN
  Administered 2023-03-13: 20 mg via INTRAVENOUS
  Administered 2023-03-13: 30 mg via INTRAVENOUS
  Administered 2023-03-13 (×2): 20 mg via INTRAVENOUS
  Administered 2023-03-13: 30 mg via INTRAVENOUS
  Administered 2023-03-13: 50 ug/kg/min via INTRAVENOUS
  Administered 2023-03-13: 20 mg via INTRAVENOUS
  Administered 2023-03-13: 40 mg via INTRAVENOUS

## 2023-03-13 MED ORDER — PENTAFLUOROPROP-TETRAFLUOROETH EX AERO: 1.0000 | INHALATION_SPRAY | CUTANEOUS | Status: DC | PRN

## 2023-03-13 MED ORDER — LIDOCAINE HCL (PF) 1 % IJ SOLN: 5.0000 mL | INTRAMUSCULAR | Status: DC | PRN

## 2023-03-13 MED ORDER — OXYCODONE-ACETAMINOPHEN 5-325 MG PO TABS
1.0000 | ORAL_TABLET | Freq: Four times a day (QID) | ORAL | Status: DC | PRN
  Administered 2023-03-13 (×2): 1 via ORAL
  Filled 2023-03-13 (×2): qty 1

## 2023-03-13 MED ORDER — ANTICOAGULANT SODIUM CITRATE 4% (200MG/5ML) IV SOLN: 5.0000 mL | Status: DC | PRN

## 2023-03-13 SURGICAL SUPPLY — 25 items

## 2023-03-13 NOTE — Progress Notes (Signed)
   03/13/23 1941  Vitals  Temp 97.9 F (36.6 C)  Pulse Rate 94  Resp 10  SpO2 99 %  O2 Device Room Air  Weight 47.8 kg  Type of Weight Post-Dialysis  Oxygen Therapy  Patient Activity (if Appropriate) In bed  Pulse Oximetry Type Continuous  Oximetry Probe Site Changed No  Post Treatment  Dialyzer Clearance Lightly streaked  Hemodialysis Intake (mL) 0 mL  Liters Processed 63  Fluid Removed (mL) 2000 mL  Tolerated HD Treatment Yes  AVG/AVF Arterial Site Held (minutes) 10 minutes  AVG/AVF Venous Site Held (minutes) 10 minutes   Received patient in bed to unit.  Alert and oriented.  Informed consent signed and in chart.   TX duration:3.15  Patient tolerated well.  Transported back to the room  Alert, without acute distress.  Hand-off given to patient's nurse.   Access used: LUAF Access issues: no complications  Total UF removed: 2000 Medication(s) given: none   [facility]

## 2023-03-13 NOTE — Transfer of Care (Signed)
Immediate Anesthesia Transfer of Care Note  Patient: Ashley [name]  Procedure(s) Performed: COLONOSCOPY WITH PROPOFOL ESOPHAGOGASTRODUODENOSCOPY (EGD) WITH PROPOFOL BIOPSY POLYPECTOMY HEMOSTASIS CLIP PLACEMENT  Patient Location: PACU  Anesthesia Type:MAC  Level of Consciousness: awake, alert , and oriented  Airway & Oxygen Therapy: Patient Spontanous Breathing  Post-op Assessment: Report given to RN and Post -op Vital signs reviewed and stable  Post vital signs: Reviewed and stable  Last Vitals:  Vitals Value Taken Time  BP 147/72 03/13/23 0901  Temp    Pulse 75 03/13/23 0903  Resp 12 03/13/23 0903  SpO2 100 % 03/13/23 0903  Vitals shown include unfiled device data.  Last Pain:  Vitals:   03/13/23 0708  TempSrc: Tympanic  PainSc: 0-No pain      Patients Stated Pain Goal: 2 (03/12/23 2100)  Complications: There were no known notable events for this encounter.

## 2023-03-13 NOTE — Progress Notes (Signed)
PROGRESS NOTE  Ashley [name]  DOB: 07-10-1950  PCP: Raymon Mutton., FNP IEP:[id number]  DOA: 03/10/2023  LOS: 3 days  Hospital Day: 4  Brief narrative: Ashley [name] is a 72 y.o. female with PMH significant for ESRD-HD-MWF, HTN, HLD, CVA, chronic pancreatitis with exocrine pancreatic insufficiency, duodenal AVMs, remote perforated PUD, h/o polysubstance abuse, treated HCV. Patient presented to the ED last night with complaint of abdominal pain and rectal bleeding. Of note, patient has history of GI bleeding.  She was hospitalized in May 2024 and underwent EGD by Dr. Myrtie Neither, noted to have a single angiectasia in duodenal bulb s/p APC. She is a dialysis patient and supposed to undergo dialysis MWF. She gets erythropoietin intermittently at dialysis. Last dialysis was Wednesday 9/25 last week.    9/26, she was seen in the ED after a small volume BRBPR.  Hemoglobin was 9.8 at the time.  CT abdomen was unremarkable and was sent to home. At home, patient reports she subsequently had multiple large volume BRBPR with only small amount of stool.  Because of her symptoms and weakness, she missed 2 sessions since then.  And hence presented to ED last night.  In the ED, patient was afebrile, blood pressure 163/66, breathing on room air Labs showed WC count 5.4, hemoglobin 7.7, BN/creatinine 25/7.45, potassium 4.3 FOBT positive CT abdomen pelvis unremarkable. In the ED, she was given IV pain meds, IV hydralazine Admitted to [facility] GI was consulted  Subjective: Patient was seen and examined this morning. Underwent EGD and colonoscopy today.  Findings as below.  Assessment/Plan: Acute GI bleeding H/o GI bleeding - duodenal AVMs, remote perforated PUD Presented with multiple episode of BRBPR Noted to have hemoglobin lower than baseline. GI consulted  Underwent EGD and colonoscopy today. EGD showed normal esophagus, single mucosal nodule in the stomach, antral gastritis (biopsied),, normal mucosa  in the duodenal Colonoscopy showed polyps in the sigmoid, descending and ascending colons which were resected and retrieved.  Normal mucosa and nonbleeding internal hemorrhoids.  Acute on chronic blood loss anemia Hemoglobin at baseline close to 8.  Hemoglobin dropped down to 6.6 on 10/2.  1 unit of PRBC transfusion was given.  Hemoglobin improved since then Protonix to continue Recent Labs    09/06/22 0844 10/20/22 1700 03/05/23 1248 03/10/23 0400 03/11/23 0848 03/12/23 0445 03/13/23 0400  HGB  --    < > 9.8* 7.7* 6.6* 8.9* 9.5*  MCV  --    < > 90.6 89.3 90.2 89.3 90.2  FERRITIN 1,296*  --   --   --   --   --   --   TIBC 158*  --   --   --   --   --   --   IRON 31  --   --   --   --   --   --    < > = values in this interval not displayed.   ESRD-HD-MWF Nephrology consulted.  Routine dialysis today Continue Sensipar, Renvela  HTN PTA meds- amlodipine 10 mg daily, hydralazine 75 mg 3 times daily Currently continued on both.  Blood pressure stable.  Continue to monitor blood pressure.  IV hydralazine as needed.  CVA/HLD PTA meds-continue Lipitor 20 mg daily  chronic pancreatitis with exocrine pancreatic insufficiency Supposed to be on Creon, ?? Not taking   Polyneuropathy Continue Neurontin nightly  h/o polysubstance abuse treated HCV Reports currently not using drugs.  Follow-up as an outpatient for HCV  Mobility: Lives at  home with her daughter.  Able to ambulate independently.  Encourage ambulation  Goals of care   Code Status: Full Code    DVT prophylaxis:  SCDs Start: 03/10/23 0820   Antimicrobials: None Fluid: None Consultants: GI, nephro Family Communication: None at bedside  Status: Inpatient Level of care:  Telemetry Medical   Patient is from: Home Needs to continue in-hospital care: Underwent EGD/colonoscopy today Anticipated d/c to: If hgb remains stable, plan to discharge tomorrow      Diet:  Diet Order             Diet full liquid  Fluid consistency: Thin  Diet effective now                   Scheduled Meds:  amLODipine  10 mg Oral Daily   atorvastatin  20 mg Oral QHS   cinacalcet  60 mg Oral Q supper   darbepoetin (ARANESP) injection - DIALYSIS  60 mcg Subcutaneous Q Tue-1800   doxercalciferol  3 mcg Intravenous Q M,W,F-HD   gabapentin  200 mg Oral QHS   hydrALAZINE  75 mg Oral Q8H   pantoprazole  40 mg Oral Daily   sevelamer carbonate  800 mg Oral TID WC    PRN meds: albuterol, alteplase, anticoagulant sodium citrate, heparin, hydrALAZINE, HYDROmorphone (DILAUDID) injection, lidocaine (PF), lidocaine-prilocaine, ondansetron **OR** ondansetron (ZOFRAN) IV, oxyCODONE-acetaminophen, pentafluoroprop-tetrafluoroeth   Infusions:   anticoagulant sodium citrate      Antimicrobials: Anti-infectives (From admission, onward)    None       Objective: Vitals:   03/13/23 1548 03/13/23 1558  BP: (!) 118/59 (!) 120/58  Pulse: 79 78  Resp: 11 12  Temp: 98.3 F (36.8 C)   SpO2: 99% 100%    Intake/Output Summary (Last 24 hours) at 03/13/2023 1612 Last data filed at 03/13/2023 1407 Gross per 24 hour  Intake 760 ml  Output --  Net 760 ml   Filed Weights   03/10/23 0305 03/11/23 1103 03/13/23 1548  Weight: 47 kg 45.6 kg 49.8 kg   Weight change:  Body mass index is 20.08 kg/m.   Physical Exam: General exam: Pleasant, elderly African-American female.  Not in physical distress Skin: No rashes, lesions or ulcers. HEENT: Atraumatic, normocephalic, no obvious bleeding Lungs: Clear to auscultation bilaterally CVS: Regular rate and rhythm, no murmur GI/Abd soft, mild epigastric tenderness continues, nondistended, bowel sound present CNS: Alert, awake, oriented x 3 Psychiatry: Mood appropriate Extremities: No pedal edema, no calf tenderness  Data Review: I have personally reviewed the laboratory data and studies available.  F/u labs ordered Unresulted Labs (From admission, onward)     Start      Ordered   03/13/23 1159  CBC  Once,   R        03/13/23 1158   03/12/23 0500  CBC with Differential/Platelet  Daily,   R      03/11/23 1654   03/12/23 0500  Basic metabolic panel  Daily,   R      03/11/23 1654   Signed and Held  Renal function panel  Once,   R        Signed and Held            Total time spent in review of labs and imaging, patient evaluation, formulation of plan, documentation and communication with family: 45 minutes  Signed, Lorin Glass, MD [facility] 03/13/2023

## 2023-03-13 NOTE — Plan of Care (Signed)
  Problem: Clinical Measurements: Goal: Diagnostic test results will improve Outcome: Not Progressing   Problem: Elimination: Goal: Will not experience complications related to bowel motility Outcome: Not Progressing   Problem: Pain Managment: Goal: General experience of comfort will improve Outcome: Not Progressing   

## 2023-03-13 NOTE — Op Note (Signed)
[facility] Patient Name: Ashley [name] Procedure Date : 03/13/2023 MRN: [id number] Attending MD: Doristine Locks , MD, [id number] Date of Birth: April 25, 1951 CSN: [id number] Age: 72 Admit Type: Inpatient Procedure:                Upper GI endoscopy Indications:              Acute on chronic anemia Providers:                Doristine Locks, MD, Stephens Shire RN, RN, Marja Kays, Technician, Jacquelyn "Jaci" Clelia Croft, RN Referring MD:              Medicines:                Monitored Anesthesia Care Complications:            No immediate complications. Estimated Blood Loss:     Estimated blood loss was minimal. Procedure:                Pre-Anesthesia Assessment:                           - Prior to the procedure, a History and Physical                            was performed, and patient medications and                            allergies were reviewed. The patient's tolerance of                            previous anesthesia was also reviewed. The risks                            and benefits of the procedure and the sedation                            options and risks were discussed with the patient.                            All questions were answered, and informed consent                            was obtained. Prior Anticoagulants: The patient has                            taken no anticoagulant or antiplatelet agents. ASA                            Grade Assessment: III - A patient with severe                            systemic disease. After reviewing the risks and  benefits, the patient was deemed in satisfactory                            condition to undergo the procedure.                           After obtaining informed consent, the endoscope was                            passed under direct vision. Throughout the                            procedure, the patient's blood pressure, pulse, and                             oxygen saturations were monitored continuously. The                            GIF-H190 ([id number]) Olympus endoscope was introduced                            through the mouth, and advanced to the third part                            of duodenum. The upper GI endoscopy was                            accomplished without difficulty. The patient                            tolerated the procedure well. Scope In: Scope Out: Findings:      The examined esophagus was normal.      A single 4 mm mucosal nodule with no bleeding was found in the gastric       antrum. Biopsies were taken with a cold forceps for histology. Estimated       blood loss was minimal.      Localized mild inflammation characterized by congestion (edema),       erosions and erythema was found at the incisura, in the gastric antrum       and in the prepyloric region of the stomach. Biopsies were taken with a       cold forceps for Helicobacter pylori testing. Estimated blood loss was       minimal.      The gastric fundus and gastric body were normal.      1 hemostatic clip was seen in the duodenal bulb.      Normal mucosa was found in the duodenal bulb, in the first portion of       the duodenum, in the second portion of the duodenum and in the third       portion of the duodenum. Impression:               - Normal esophagus.                           - A single mucosal nodule found in the stomach. Had  an inflammatory appearance. Biopsied.                           - Midl antral gastritis. Biopsied.                           - Normal gastric fundus and gastric body.                           - Retained clip that was previously placed was                            found in the duodenal bulb.                           - Normal mucosa was found in the duodenal bulb, in                            the first portion of the duodenum, in the second                            portion  of the duodenum and in the third portion of                            the duodenum. Recommendation:           - Continue present medications.                           - Await pathology results.                           - Perform a colonoscopy today. Procedure Code(s):        --- Professional ---                           [phone], Esophagogastroduodenoscopy, flexible,                            transoral; with biopsy, single or multiple Diagnosis Code(s):        --- Professional ---                           K31.89, Other diseases of stomach and duodenum                           K29.70, Gastritis, unspecified, without bleeding                           D50.0, Iron deficiency anemia secondary to blood                            loss (chronic) CPT copyright 2022 American [employer] Association. All rights reserved. The codes documented in this report are preliminary and upon coder review may  be revised to meet current compliance requirements. Doristine Locks, MD 03/13/2023 9:16:52 AM Number of Addenda:  0

## 2023-03-13 NOTE — Anesthesia Postprocedure Evaluation (Signed)
Anesthesia Post Note  Patient: Ashley [name]  Procedure(s) Performed: COLONOSCOPY WITH PROPOFOL ESOPHAGOGASTRODUODENOSCOPY (EGD) WITH PROPOFOL BIOPSY POLYPECTOMY HEMOSTASIS CLIP PLACEMENT     Patient location during evaluation: PACU Anesthesia Type: MAC Level of consciousness: awake and alert Pain management: pain level controlled Vital Signs Assessment: post-procedure vital signs reviewed and stable Respiratory status: spontaneous breathing Cardiovascular status: stable Anesthetic complications: no   There were no known notable events for this encounter.  Last Vitals:  Vitals:   03/13/23 1548 03/13/23 1558  BP: (!) 118/59 (!) 120/58  Pulse: 79 78  Resp: 11 12  Temp: 36.8 C   SpO2: 99% 100%    Last Pain:  Vitals:   03/13/23 1548  TempSrc: Oral  PainSc:                  [name]

## 2023-03-13 NOTE — Anesthesia Preprocedure Evaluation (Addendum)
Anesthesia Evaluation  Patient identified by MRN, date of birth, ID band Patient awake    Reviewed: Allergy & Precautions, NPO status , Patient's Chart, lab work & pertinent test results  History of Anesthesia Complications (+) MALIGNANT HYPERTHERMIA and history of anesthetic complications  Airway Mallampati: II  TM Distance: >3 FB Neck ROM: Full    Dental  (+) Edentulous Upper, Poor Dentition, Missing, Dental Advisory Given, Partial Lower,    Pulmonary pneumonia, Current Smoker and Patient abstained from smoking. + COVID   Pulmonary exam normal breath sounds clear to auscultation       Cardiovascular hypertension, Pt. on medications +CHF   Rhythm:Regular Rate:Normal + Systolic murmurs   1. Anterior and anterolateral hypokinesis. Left ventricular ejection  fraction, by estimation, is 40 to 45%. The left ventricle has mildly  decreased function. The left ventricle demonstrates regional wall motion  abnormalities (see scoring  diagram/findings for description). There is moderate concentric left  ventricular hypertrophy. Left ventricular diastolic parameters are  consistent with Grade I diastolic dysfunction (impaired relaxation).  2. Right ventricular systolic function is normal. The right ventricular  size is normal. There is mildly elevated pulmonary artery systolic  pressure.  3. Left atrial size was severely dilated.  4. Right atrial size was severely dilated.  5. The mitral valve is normal in structure. Mild mitral valve  regurgitation. No evidence of mitral stenosis.  6. The aortic valve is tricuspid. Aortic valve regurgitation is not  visualized. No aortic stenosis is present.  7. The inferior vena cava is normal in size with greater than 50%  respiratory variability, suggesting right atrial pressure of 3 mmHg.    Neuro/Psych Cervical radiculopathy  Neuromuscular disease CVA, Residual Symptoms  negative psych  ROS   GI/Hepatic hiatal hernia, PUD,GERD  ,,(+)     substance abuse  cocaine use and marijuana use, Hepatitis -, Cpancreatitis   Endo/Other  hyperlipidemia  Renal/GU DialysisRenal disease     Musculoskeletal  (+) Arthritis ,    Abdominal   Peds  Hematology  (+) Blood dyscrasia, anemia   Anesthesia Other Findings   Reproductive/Obstetrics negative OB ROS                             Anesthesia Physical Anesthesia Plan  ASA: 4  Anesthesia Plan: MAC   Post-op Pain Management: Minimal or no pain anticipated   Induction:   PONV Risk Score and Plan: 1 and Propofol infusion, TIVA and Treatment may vary due to age or medical condition  Airway Management Planned: Natural Airway and Simple Face Mask  Additional Equipment: None  Intra-op Plan:   Post-operative Plan:   Informed Consent: I have reviewed the patients History and Physical, chart, labs and discussed the procedure including the risks, benefits and alternatives for the proposed anesthesia with the patient or authorized representative who has indicated his/her understanding and acceptance.     Dental advisory given  Plan Discussed with: CRNA  Anesthesia Plan Comments:         Anesthesia Quick Evaluation

## 2023-03-13 NOTE — Op Note (Signed)
[facility] Patient Name: Ashley [name] Procedure Date : 03/13/2023 MRN: [id number] Attending MD: Doristine Locks , MD, [id number] Date of Birth: [birth year] CSN: [id number] Age: 72 Admit Type: Inpatient Procedure:                Colonoscopy Indications:              Acute post hemorrhagic anemia Providers:                Doristine Locks, MD, Stephens Shire RN, RN, Jacquelyn                            "Jaci" Clelia Croft, RN, Marja Kays, Technician Referring MD:              Medicines:                Monitored Anesthesia Care Complications:            No immediate complications. Estimated Blood Loss:     Estimated blood loss was minimal. Procedure:                Pre-Anesthesia Assessment:                           - Prior to the procedure, a History and Physical                            was performed, and patient medications and                            allergies were reviewed. The patient's tolerance of                            previous anesthesia was also reviewed. The risks                            and benefits of the procedure and the sedation                            options and risks were discussed with the patient.                            All questions were answered, and informed consent                            was obtained. Prior Anticoagulants: The patient has                            taken no anticoagulant or antiplatelet agents. ASA                            Grade Assessment: III - A patient with severe                            systemic disease. After reviewing the risks and  benefits, the patient was deemed in satisfactory                            condition to undergo the procedure.                           After obtaining informed consent, the colonoscope                            was passed under direct vision. Throughout the                            procedure, the patient's blood pressure, pulse, and                             oxygen saturations were monitored continuously. The                            [id number]-HQ190TL ([id number] Olympus peds colonoscope was                            introduced through the anus and advanced to the the                            terminal ileum. The colonoscopy was performed                            without difficulty. The patient tolerated the                            procedure well. The quality of the bowel                            preparation was good. The terminal ileum, ileocecal                            valve, appendiceal orifice, and rectum were                            photographed. Scope In: 8:28:46 AM Scope Out: 8:53:47 AM Scope Withdrawal Time: 0 hours 20 minutes 32 seconds  Total Procedure Duration: 0 hours 25 minutes 1 second  Findings:      The perianal and digital rectal examinations were normal.      Three sessile polyps were found in the sigmoid colon, descending colon       and ascending colon. The polyps were 3 to 8 mm in size. These polyps       were removed with a cold snare. Resection and retrieval were complete.       Given location and appearance, I elected for placement of 1 hemostatic       clip at the ascending colon polypectomy site with complete closure of       polypectomy site. Clip manufacturer: [employer]. There was no       bleeding at the end of the procedure.      Normal  mucosa was found in the entire colon. No areas of mucosal       erythema, edema, erosions, or ulceration. No blood in the lower GI lumen.      Non-bleeding internal hemorrhoids were found during retroflexion. The       hemorrhoids were small and Grade I (internal hemorrhoids that do not       prolapse).      The terminal ileum appeared normal. Impression:               - Three 3 to 8 mm polyps in the sigmoid colon, in                            the descending colon and in the ascending colon,                            removed with a cold  snare. Resected and retrieved.                            Clip (MR conditional) was placed. Clip                            manufacturer: [employer].                           - Normal mucosa in the entire examined colon.                           - Non-bleeding internal hemorrhoids.                           - The examined portion of the ileum was normal. Recommendation:           - Return patient to hospital ward for ongoing care.                           - Advance diet as tolerated.                           - Continue present medications.                           - Await pathology results.                           - Repeat colonoscopy for surveillance based on                            pathology results.                           - Continue daily CBC checks while inpatient.                           - Inpatient GI service will sign off at this time.  Please do not hesitate to contact us with                            additional questions or concerns. Procedure Code(s):        --- Professional ---                           [phone], Colonoscopy, flexible; with removal of                            tumor(s), polyp(s), or other lesion(s) by snare                            technique Diagnosis Code(s):        --- Professional ---                           D12.5, Benign neoplasm of sigmoid colon                           D12.4, Benign neoplasm of descending colon                           D12.2, Benign neoplasm of ascending colon                           K64.0, First degree hemorrhoids                           D62, Acute posthemorrhagic anemia CPT copyright 2022 [employer]. All rights reserved. The codes documented in this report are preliminary and upon coder review may  be revised to meet current compliance requirements. Doristine Locks, MD 03/13/2023 9:21:32 AM Number of Addenda: 0

## 2023-03-13 NOTE — Interval H&P Note (Signed)
History and Physical Interval Note:  Completed additional bowel prep yesterday.  Reports clear liquid stools.  No overt bleeding.  H/H stable/uptrending at 9.5/28.4 today. Plan to proceed with EGD/colonoscopy today for diagnostic and therapeutic intent.  03/13/2023 7:23 AM  Ashley [name]  has presented today for surgery, with the diagnosis of rectal bleeding, history of AVMs.  The various methods of treatment have been discussed with the patient and family. After consideration of risks, benefits and other options for treatment, the patient has consented to  Procedure(s): COLONOSCOPY WITH PROPOFOL (N/A) ESOPHAGOGASTRODUODENOSCOPY (EGD) WITH PROPOFOL (N/A) as a surgical intervention.  The patient's history has been reviewed, patient examined, no change in status, stable for surgery.  I have reviewed the patient's chart and labs.  Questions were answered to the patient's satisfaction.     Ashley [name]

## 2023-03-13 NOTE — Plan of Care (Signed)
  Problem: Education: Goal: Knowledge of General Education information will improve Description: Including pain rating scale, medication(s)/side effects and non-pharmacologic comfort measures Outcome: Progressing   Problem: Health Behavior/Discharge Planning: Goal: Ability to manage health-related needs will improve Outcome: Progressing   Problem: Clinical Measurements: Goal: Ability to maintain clinical measurements within normal limits will improve Outcome: Progressing Goal: Will remain free from infection Outcome: Progressing Goal: Diagnostic test results will improve Outcome: Progressing Goal: Respiratory complications will improve Outcome: Progressing Goal: Cardiovascular complication will be avoided Outcome: Progressing   Problem: Activity: Goal: Risk for activity intolerance will decrease Outcome: Progressing   Problem: Nutrition: Goal: Adequate nutrition will be maintained Outcome: Progressing   Problem: Coping: Goal: Level of anxiety will decrease Outcome: Progressing   Problem: Elimination: Goal: Will not experience complications related to bowel motility Outcome: Progressing Goal: Will not experience complications related to urinary retention Outcome: Progressing   Problem: Pain Managment: Goal: General experience of comfort will improve Outcome: Progressing   Problem: Safety: Goal: Ability to remain free from injury will improve Outcome: Progressing   Problem: Skin Integrity: Goal: Risk for impaired skin integrity will decrease Outcome: Progressing   Problem: Education: Goal: Knowledge of disease and its progression will improve Outcome: Progressing   Problem: Fluid Volume: Goal: Compliance with measures to maintain balanced fluid volume will improve Outcome: Progressing   Problem: Health Behavior/Discharge Planning: Goal: Ability to manage health-related needs will improve Outcome: Progressing   Problem: Nutritional: Goal: Ability to make  healthy dietary choices will improve Outcome: Progressing   Problem: Clinical Measurements: Goal: Complications related to the disease process, condition or treatment will be avoided or minimized Outcome: Progressing

## 2023-03-13 NOTE — Progress Notes (Signed)
[facility] Progress Note   Subjective:   S/p endoscopy this AM. Pt reports she is feeling well, denies SOB, CP, dizziness, nausea.   Objective Vitals:   03/12/23 1458 03/12/23 2145 03/13/23 0538 03/13/23 0708  BP: 139/62 (!) 132/47 (!) 141/64 (!) 144/63  Pulse: 82 81 79 80  Resp: 16 20 20 11   Temp: 98.8 F (37.1 C) 98.3 F (36.8 C) 97.8 F (36.6 C) 98.1 F (36.7 C)  TempSrc: Oral Oral Oral Tympanic  SpO2: 98% 96% 94% 92%  Weight:      Height:       Physical Exam General: Alert female in NAD Heart: RRR, no murmurs, rubs or gallops Lungs: CTA bilaterally Abdomen: Soft, non-distended, +BS Extremities: No edema b/l lower extremities Dialysis Access:  LUE AVF + bruit  Additional Objective Labs: Basic Metabolic Panel: Recent Labs  Lab 03/10/23 1654 03/11/23 0848 03/12/23 0445 03/13/23 0400  NA  --  139 135 138  K  --  4.0 4.3 4.7  CL  --  98 94* 98  CO2  --  27 28 23   GLUCOSE  --  84 82 96  BUN  --  25* 19 23  CREATININE  --  7.64* 6.90* 8.79*  CALCIUM  --  6.7* 6.6* 6.8*  PHOS 3.9  --   --   --    Liver Function Tests: Recent Labs  Lab 03/10/23 0400  AST 23  ALT 12  ALKPHOS 52  BILITOT 0.4  PROT 6.5  ALBUMIN 3.1*   Recent Labs  Lab 03/10/23 0400  LIPASE 52*   CBC: Recent Labs  Lab 03/10/23 0400 03/11/23 0848 03/12/23 0445 03/13/23 0400  WBC 5.4 4.3 6.1 6.6  NEUTROABS 3.4  --  3.1 3.8  HGB 7.7* 6.6* 8.9* 9.5*  HCT 24.2* 21.2* 26.0* 28.4*  MCV 89.3 90.2 89.3 90.2  PLT 290 252 245 304   Blood Culture    Component Value Date/Time   SDES URINE, RANDOM 06/20/2017 2222   SPECREQUEST NONE 06/20/2017 2222   CULT MULTIPLE SPECIES PRESENT, SUGGEST RECOLLECTION (A) 06/20/2017 2222   REPTSTATUS 06/22/2017 FINAL 06/20/2017 2222    Cardiac Enzymes: No results for input(s): "CKTOTAL", "CKMB", "CKMBINDEX", "TROPONINI" in the last 168 hours. CBG: No results for input(s): "GLUCAP" in the last 168 hours. Iron Studies: No results for  input(s): "IRON", "TIBC", "TRANSFERRIN", "FERRITIN" in the last 72 hours. @lablastinr3 @ Studies/Results: No results found. Medications:   [MAR Hold] amLODipine  10 mg Oral Daily   [MAR Hold] atorvastatin  20 mg Oral QHS   [MAR Hold] cinacalcet  60 mg Oral Q supper   [MAR Hold] darbepoetin (ARANESP) injection - DIALYSIS  60 mcg Subcutaneous Q Tue-1800   [MAR Hold] doxercalciferol  3 mcg Intravenous Q M,W,F-HD   [MAR Hold] gabapentin  200 mg Oral QHS   [MAR Hold] hydrALAZINE  75 mg Oral Q8H   [MAR Hold] ondansetron (ZOFRAN) IV  4 mg Intravenous Q6H   [MAR Hold] pantoprazole  40 mg Oral Daily   [MAR Hold] sevelamer carbonate  800 mg Oral TID WC    Dialysis Orders:  MWF 3.5h   350/500   46.7kg   2/2.5 bath  AVF  Heparin none - last OP HD 9/30, post wt 47.8kg - hectorol 3 mcg - mircera 75 mcg q 2 , last 9/13, due 9/27  Assessment/Plan: GI bleed - w/ rectal bleeding and abd pain. 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 EGD and colonoscopy this AM. Per pmd.  Acute  on chronic Anemia ESRD - 2/2 #1. Hgb drop 6.6 and improved to 8.9 s/p 1unit pRBC. Missed last esa on 9/27. Aranesp given on 10/1, continue weekly while here. Hgb up to 9.5 today.  ESRD - on HD MWF. Next HD 10/4. Historically non compliant with full treatment and typically signs off early.  HTN - BP variable, elevated today. Continue home meds. Monitor post HD Volume - Does not appear volume overloaded.  No longer on O2. UF as tolerated.   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 Cont IV vdra three times per week, and renvela as binder when eating.    Rogers Blocker, PA-C 03/13/2023, 8:55 AM  [facility] Pager: [phone]

## 2023-03-14 DIAGNOSIS — K922 Gastrointestinal hemorrhage, unspecified: Secondary | ICD-10-CM | POA: Diagnosis not present

## 2023-03-14 LAB — CBC WITH DIFFERENTIAL/PLATELET
Abs Immature Granulocytes: 0.04 K/uL (ref 0.00–0.07)
Basophils Absolute: 0.1 K/uL (ref 0.0–0.1)
Basophils Relative: 1 %
Eosinophils Absolute: 0.3 K/uL (ref 0.0–0.5)
Eosinophils Relative: 5 %
HCT: 28.2 % — ABNORMAL LOW (ref 36.0–46.0)
Hemoglobin: 9.4 g/dL — ABNORMAL LOW (ref 12.0–15.0)
Immature Granulocytes: 1 %
Lymphocytes Relative: 19 %
Lymphs Abs: 1.3 K/uL (ref 0.7–4.0)
MCH: 29.4 pg (ref 26.0–34.0)
MCHC: 33.3 g/dL (ref 30.0–36.0)
MCV: 88.1 fL (ref 80.0–100.0)
Monocytes Absolute: 0.7 K/uL (ref 0.1–1.0)
Monocytes Relative: 11 %
Neutro Abs: 4.2 K/uL (ref 1.7–7.7)
Neutrophils Relative %: 63 %
Platelets: 317 K/uL (ref 150–400)
RBC: 3.2 MIL/uL — ABNORMAL LOW (ref 3.87–5.11)
RDW: 19.3 % — ABNORMAL HIGH (ref 11.5–15.5)
WBC: 6.6 K/uL (ref 4.0–10.5)
nRBC: 0 % (ref 0.0–0.2)

## 2023-03-14 LAB — BASIC METABOLIC PANEL
Anion gap: 14 (ref 5–15)
BUN: 8 mg/dL (ref 8–23)
CO2: 28 mmol/L (ref 22–32)
Calcium: 7.6 mg/dL — ABNORMAL LOW (ref 8.9–10.3)
Chloride: 94 mmol/L — ABNORMAL LOW (ref 98–111)
Creatinine, Ser: 5.52 mg/dL — ABNORMAL HIGH (ref 0.44–1.00)
GFR, Estimated: 8 mL/min — ABNORMAL LOW (ref >60)
Glucose, Bld: 78 mg/dL (ref 70–99)
Potassium: 4 mmol/L (ref 3.5–5.1)
Sodium: 136 mmol/L (ref 135–145)

## 2023-03-14 MED ORDER — OXYCODONE-ACETAMINOPHEN 5-325 MG PO TABS: 1.0000 | ORAL_TABLET | Freq: Three times a day (TID) | ORAL | 0 refills | Status: AC | PRN

## 2023-03-14 NOTE — Progress Notes (Signed)
Pt discharged to home picked up by dtr. AVS reviewed and copy given to pt. Pt understands follow up and rx to be picked up.

## 2023-03-14 NOTE — Plan of Care (Signed)
  Problem: Education: Goal: Knowledge of General Education information will improve Description: Including pain rating scale, medication(s)/side effects and non-pharmacologic comfort measures Outcome: Progressing   Problem: Health Behavior/Discharge Planning: Goal: Ability to manage health-related needs will improve Outcome: Progressing   Problem: Clinical Measurements: Goal: Ability to maintain clinical measurements within normal limits will improve Outcome: Progressing Goal: Will remain free from infection Outcome: Progressing Goal: Diagnostic test results will improve Outcome: Progressing Goal: Respiratory complications will improve Outcome: Progressing Goal: Cardiovascular complication will be avoided Outcome: Progressing   Problem: Activity: Goal: Risk for activity intolerance will decrease Outcome: Progressing   Problem: Nutrition: Goal: Adequate nutrition will be maintained Outcome: Progressing   Problem: Coping: Goal: Level of anxiety will decrease Outcome: Progressing   Problem: Elimination: Goal: Will not experience complications related to bowel motility Outcome: Progressing Goal: Will not experience complications related to urinary retention Outcome: Progressing   Problem: Pain Managment: Goal: General experience of comfort will improve Outcome: Progressing   Problem: Safety: Goal: Ability to remain free from injury will improve Outcome: Progressing   Problem: Skin Integrity: Goal: Risk for impaired skin integrity will decrease Outcome: Progressing   Problem: Education: Goal: Knowledge of disease and its progression will improve Outcome: Progressing   Problem: Fluid Volume: Goal: Compliance with measures to maintain balanced fluid volume will improve Outcome: Progressing   Problem: Health Behavior/Discharge Planning: Goal: Ability to manage health-related needs will improve Outcome: Progressing   Problem: Nutritional: Goal: Ability to make  healthy dietary choices will improve Outcome: Progressing   Problem: Clinical Measurements: Goal: Complications related to the disease process, condition or treatment will be avoided or minimized Outcome: Progressing

## 2023-03-14 NOTE — Progress Notes (Signed)
[facility] Progress Note   Subjective:   Tolerated HD yesterday with 2L UF. She reports feeling well today, denies SOB, CP, dizziness, abdominal pain, nausea.   Objective Vitals:   03/13/23 2029 03/13/23 2036 03/13/23 2057 03/14/23 0440  BP:  124/73 (!) 133/51 (!) 166/82  Pulse:  99 98 95  Resp:   16 15  Temp:   98.1 F (36.7 C) 98 F (36.7 C)  TempSrc:   Oral Oral  SpO2:  98% 94% 95%  Weight: 47.1 kg     Height:       Physical Exam General: Alert female in NAD  Heart:RRR, no murmurs, rubs or gallops Lungs: CTA bilaterally Abdomen: Soft, non-distended, +BS Extremities: No edema b/l lower extremities Dialysis Access: LUE AVF + bruit  Additional Objective Labs: Basic Metabolic Panel: Recent Labs  Lab 03/10/23 1654 03/11/23 0848 03/12/23 0445 03/13/23 0400 03/14/23 0546  NA  --    < > 135 138 136  K  --    < > 4.3 4.7 4.0  CL  --    < > 94* 98 94*  CO2  --    < > 28 23 28   GLUCOSE  --    < > 82 96 78  BUN  --    < > 19 23 8   CREATININE  --    < > 6.90* 8.79* 5.52*  CALCIUM  --    < > 6.6* 6.8* 7.6*  PHOS 3.9  --   --   --   --    < > = values in this interval not displayed.   Liver Function Tests: Recent Labs  Lab 03/10/23 0400  AST 23  ALT 12  ALKPHOS 52  BILITOT 0.4  PROT 6.5  ALBUMIN 3.1*   Recent Labs  Lab 03/10/23 0400  LIPASE 52*   CBC: Recent Labs  Lab 03/10/23 0400 03/11/23 0848 03/12/23 0445 03/13/23 0400 03/14/23 0546  WBC 5.4 4.3 6.1 6.6 6.6  NEUTROABS 3.4  --  3.1 3.8 4.2  HGB 7.7* 6.6* 8.9* 9.5* 9.4*  HCT 24.2* 21.2* 26.0* 28.4* 28.2*  MCV 89.3 90.2 89.3 90.2 88.1  PLT 290 252 245 304 317   Blood Culture    Component Value Date/Time   SDES URINE, RANDOM 06/20/2017 2222   SPECREQUEST NONE 06/20/2017 2222   CULT MULTIPLE SPECIES PRESENT, SUGGEST RECOLLECTION (A) 06/20/2017 2222   REPTSTATUS 06/22/2017 FINAL 06/20/2017 2222    Cardiac Enzymes: No results for input(s): "CKTOTAL", "CKMB", "CKMBINDEX",  "TROPONINI" in the last 168 hours. CBG: Recent Labs  Lab 03/13/23 2035  GLUCAP 101*   Iron Studies: No results for input(s): "IRON", "TIBC", "TRANSFERRIN", "FERRITIN" in the last 72 hours. @lablastinr3 @ Studies/Results: No results found. Medications:   amLODipine  10 mg Oral Daily   atorvastatin  20 mg Oral QHS   cinacalcet  60 mg Oral Q supper   darbepoetin (ARANESP) injection - DIALYSIS  60 mcg Subcutaneous Q Tue-1800   doxercalciferol  3 mcg Intravenous Q M,W,F-HD   gabapentin  200 mg Oral QHS   hydrALAZINE  75 mg Oral Q8H   pantoprazole  40 mg Oral Daily   sevelamer carbonate  800 mg Oral TID WC    Outpatient Dialysis Orders:  MWF 3.5h   350/500   46.7kg   2/2.5 bath  AVF  Heparin none - last OP HD 9/30, post wt 47.8kg - hectorol 3 mcg - mircera 75 mcg q 2 , last 9/13, due 9/27  Assessment/Plan: GI bleed - w/ rectal bleeding and abd pain. 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 EGD and colonoscopy on 10/4. Per pmd and GI.  Acute on chronic Anemia ESRD - 2/2 #1. Hgb drop 6.6 and improved to 8.9 s/p 1unit pRBC. Missed last esa on 9/27. Aranesp given on 10/1, continue weekly while here. Hgb up to 9.4 today.  ESRD - on HD MWF. Next HD 10/7. Historically non compliant with full treatment and typically signs off early. Tolerating HD well here.  HTN - BP variable, improved post HD. Continue home meds.  Volume - Does not appear volume overloaded.  No longer on O2. UF as tolerated.   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 Cont IV vdra three times per week, and renvela as binder when eating.     Rogers Blocker, PA-C 03/14/2023, 8:45 AM  [facility] Pager: [phone]

## 2023-03-14 NOTE — Discharge Summary (Addendum)
Physician Discharge Summary  Ashley [name] ZOX:[id number] DOB: August 15, 1950 DOA: 03/10/2023  PCP: Raymon Mutton., FNP  Admit date: 03/10/2023 Discharge date: 03/14/2023  Admitted From: Home Discharge disposition: Home   Brief narrative: Ashley [name] is a 72 y.o. female with PMH significant for ESRD-HD-MWF, HTN, HLD, CVA, chronic pancreatitis with exocrine pancreatic insufficiency, duodenal AVMs, remote perforated PUD, h/o polysubstance abuse, treated HCV. Patient presented to the ED last night with complaint of abdominal pain and rectal bleeding. Of note, patient has history of GI bleeding.  She was hospitalized in May 2024 and underwent EGD by Dr. Myrtie Neither, noted to have a single angiectasia in duodenal bulb s/p APC. She is a dialysis patient and supposed to undergo dialysis MWF. She gets erythropoietin intermittently at dialysis. Last dialysis was Wednesday 9/25 last week.    9/26, she was seen in the ED after a small volume BRBPR.  Hemoglobin was 9.8 at the time.  CT abdomen was unremarkable and was sent to home. At home, patient reports she subsequently had multiple large volume BRBPR with only small amount of stool.  Because of her symptoms and weakness, she missed 2 sessions since then.  And hence presented to ED last night.  In the ED, patient was afebrile, blood pressure 163/66, breathing on room air Labs showed WC count 5.4, hemoglobin 7.7, BN/creatinine 25/7.45, potassium 4.3 FOBT positive CT abdomen pelvis unremarkable. In the ED, she was given IV pain meds, IV hydralazine Admitted to [facility] GI was consulted  Subjective: Patient was seen and examined this morning.  Lying in bed.  Not in distress.  Abdominal pain improving.  Diarrhea improved.  Hospital course: Acute GI bleeding Acute on chronic blood loss anemia H/o GI bleeding - duodenal AVMs, remote perforated PUD Presented with multiple episode of BRBPR Noted to have hemoglobin lower than baseline. GI consulted   Underwent EGD and colonoscopy yesterday 10/4.Marland Kitchen EGD showed normal esophagus, single mucosal nodule in the stomach, antral gastritis (biopsied),, normal mucosa in the duodenal Colonoscopy showed polyps in the sigmoid, descending and ascending colons which were resected and retrieved.  Normal mucosa and nonbleeding internal hemorrhoids. Continue Protonix as before Hemoglobin at baseline close to 8.  Hemoglobin dropped down to 6.6 on 10/2.  1 unit of PRBC transfusion was given.  Hemoglobin improved since then Recent Labs    09/06/22 0844 10/20/22 1700 03/10/23 0400 03/11/23 0848 03/12/23 0445 03/13/23 0400 03/14/23 0546  HGB  --    < > 7.7* 6.6* 8.9* 9.5* 9.4*  MCV  --    < > 89.3 90.2 89.3 90.2 88.1  FERRITIN 1,296*  --   --   --   --   --   --   TIBC 158*  --   --   --   --   --   --   IRON 31  --   --   --   --   --   --    < > = values in this interval not displayed.   ESRD-HD-MWF Nephrology consulted.  Routine dialysis today Continue Sensipar, Renvela  HTN PTA meds- amlodipine 10 mg daily, hydralazine 75 mg 3 times daily Continue both  CVA/HLD PTA meds-continue Lipitor 20 mg daily  chronic pancreatitis with exocrine pancreatic insufficiency Supposed to be on Creon, ?? Not taking   Polyneuropathy Continue Neurontin nightly  h/o polysubstance abuse treated HCV Reports currently not using drugs.  Follow-up as an outpatient for HCV  Mobility: Lives at home with  her daughter.  Able to ambulate independently.  Encourage ambulation  Goals of care   Code Status: Full Code   Wounds:  -    Discharge Exam:   Vitals:   03/13/23 2036 03/13/23 2057 03/14/23 0440 03/14/23 0923  BP: 124/73 (!) 133/51 (!) 166/82 (!) 123/59  Pulse: 99 98 95 90  Resp:  16 15 14   Temp:  98.1 F (36.7 C) 98 F (36.7 C) 98.9 F (37.2 C)  TempSrc:  Oral Oral Oral  SpO2: 98% 94% 95% 94%  Weight:      Height:        Body mass index is 18.99 kg/m.   General exam: Pleasant, elderly  African-American female.  Not in physical distress Skin: No rashes, lesions or ulcers. HEENT: Atraumatic, normocephalic, no obvious bleeding Lungs: Clear to auscultation bilaterally CVS: Regular rate and rhythm, no murmur GI/Abd soft, improved abdominal tenderness, nondistended, bowel sound present CNS: Alert, awake, oriented x 3 Psychiatry: Mood appropriate Extremities: No pedal edema, no calf tenderness  Follow ups:    Follow-up Information     Raymon Mutton., FNP Follow up.   Specialty: Family Medicine Contact information: [address] [place] [address] [phone]                 Discharge Instructions:   Discharge Instructions     Call MD for:  difficulty breathing, headache or visual disturbances   Complete by: As directed    Call MD for:  extreme fatigue   Complete by: As directed    Call MD for:  hives   Complete by: As directed    Call MD for:  persistant dizziness or light-headedness   Complete by: As directed    Call MD for:  persistant nausea and vomiting   Complete by: As directed    Call MD for:  severe uncontrolled pain   Complete by: As directed    Call MD for:  temperature >100.4   Complete by: As directed    Diet general   Complete by: As directed    Discharge instructions   Complete by: As directed    General discharge instructions: Follow with Primary MD Raymon Mutton., FNP in 7 days  Please request your PCP  to go over your hospital tests, procedures, radiology results at the follow up. Please get your medicines reviewed and adjusted.  Your PCP may decide to repeat certain labs or tests as needed. Do not drive, operate heavy machinery, perform activities at heights, swimming or participation in water activities or provide baby sitting services if your were admitted for syncope or siezures until you have seen by Primary MD or a Neurologist and advised to do so again. [place] Controlled Substance Reporting System database  was reviewed. Do not drive, operate heavy machinery, perform activities at heights, swim, participate in water activities or provide baby-sitting services while on medications for pain, sleep and mood until your outpatient physician has reevaluated you and advised to do so again.  You are strongly recommended to comply with the dose, frequency and duration of prescribed medications. Activity: As tolerated with Full fall precautions use walker/cane & assistance as needed Avoid using any recreational substances like cigarette, tobacco, alcohol, or non-prescribed drug. If you experience worsening of your admission symptoms, develop shortness of breath, life threatening emergency, suicidal or homicidal thoughts you must seek medical attention immediately by calling 911 or calling your MD immediately  if symptoms less severe. You must read  complete instructions/literature along with all the possible adverse reactions/side effects for all the medicines you take and that have been prescribed to you. Take any new medicine only after you have completely understood and accepted all the possible adverse reactions/side effects.  Wear Seat belts while driving. You were cared for by a hospitalist during your hospital stay. If you have any questions about your discharge medications or the care you received while you were in the hospital after you are discharged, you can call the unit and ask to speak with the hospitalist or the covering physician. Once you are discharged, your primary care physician will handle any further medical issues. Please note that NO REFILLS for any discharge medications will be authorized once you are discharged, as it is imperative that you return to your primary care physician (or establish a relationship with a primary care physician if you do not have one).   Increase activity slowly   Complete by: As directed        Discharge Medications:   Allergies as of 03/14/2023       Reactions    Aspirin Nausea And Vomiting   Stomach ache   Ibuprofen Nausea And Vomiting   Stomach ache        Medication List     TAKE these medications    amLODipine 10 MG tablet Commonly known as: NORVASC Take 1 tablet (10 mg total) by mouth daily. What changed: when to take this   atorvastatin 20 MG tablet Commonly known as: LIPITOR Take 20 mg by mouth at bedtime.   cinacalcet 60 MG tablet Commonly known as: SENSIPAR Take 60 mg by mouth every evening.   Creon 12000-38000 units Cpep capsule Generic drug: lipase/protease/amylase Take by mouth.   DIALYVITE 800 WITH ZINC 0.8 MG Tabs Take 1 tablet by mouth daily.   gabapentin 100 MG capsule Commonly known as: NEURONTIN Take 200 mg by mouth at bedtime.   hydrALAZINE 25 MG tablet Commonly known as: APRESOLINE Take 3 tablets (75 mg total) by mouth every 8 (eight) hours.   lidocaine-prilocaine cream Commonly known as: EMLA Apply 1 application topically See admin instructions. Apply small amount to access site on Tuesday, Thursday, Saturday one hour before dialysis. Cover with occlusive dressing (saran wrap)   oxyCODONE-acetaminophen 5-325 MG tablet Commonly known as: PERCOCET/ROXICET Take 1 tablet by mouth every 8 (eight) hours as needed for up to 3 days for moderate pain.   pantoprazole 40 MG tablet Commonly known as: PROTONIX Take 1 tablet (40 mg total) by mouth daily.   sevelamer carbonate 800 MG tablet Commonly known as: RENVELA Take 800 mg by mouth 3 (three) times daily with meals.         The results of significant diagnostics from this hospitalization (including imaging, microbiology, ancillary and laboratory) are listed below for reference.    Procedures and Diagnostic Studies:   CT ABDOMEN PELVIS WO CONTRAST  Result Date: 03/10/2023 CLINICAL DATA:  Acute abdominal pain with rectal bleeding EXAM: CT ABDOMEN AND PELVIS WITHOUT CONTRAST TECHNIQUE: Multidetector CT imaging of the abdomen and pelvis was  performed following the standard protocol without IV contrast. RADIATION DOSE REDUCTION: This exam was performed according to the departmental dose-optimization program which includes automated exposure control, adjustment of the mA and/or kV according to patient size and/or use of iterative reconstruction technique. COMPARISON:  03/05/2023 FINDINGS: Lower chest: Small pleural effusions with scarring or atelectasis at the lower lobes. Atheromatous plaque. Hepatobiliary: No focal liver abnormality.No evidence of biliary obstruction or  stone. Pancreas: Unremarkable. Spleen: Unremarkable. Adrenals/Urinary Tract: Negative adrenals. No hydronephrosis or stone. Pronounced renal atrophy. Bilateral simple appearing renal cysts with no follow-up imaging recommended. Unremarkable bladder. Stomach/Bowel: There are a few fluid-filled small bowel loops in the lower abdomen and pelvis but no specific obstructive pattern or clear bowel wall thickening. No appendicitis. Hemoclip appearance at the peri-pyloric region. Vascular/Lymphatic: No acute vascular abnormality. Extensive atheromatous plaque affecting the aorta and iliacs. No mass or adenopathy. Reproductive:No acute finding Other: No ascites or pneumoperitoneum. Musculoskeletal: Degenerative changes without acute finding. IMPRESSION: 1. No acute finding or specific cause for symptoms. 2. Small pleural effusions. 3. Extensive atherosclerosis. Electronically Signed   By: Tiburcio Pea M.D.   On: 03/10/2023 05:57     Labs:   Basic Metabolic Panel: Recent Labs  Lab 03/10/23 0400 03/10/23 1654 03/11/23 0848 03/12/23 0445 03/13/23 0400 03/14/23 0546  NA 142  --  139 135 138 136  K 4.3  --  4.0 4.3 4.7 4.0  CL 96*  --  98 94* 98 94*  CO2 28  --  27 28 23 28   GLUCOSE 89  --  84 82 96 78  BUN 25*  --  25* 19 23 8   CREATININE 7.45*  --  7.64* 6.90* 8.79* 5.52*  CALCIUM 7.7*  --  6.7* 6.6* 6.8* 7.6*  PHOS  --  3.9  --   --   --   --    GFR Estimated  Creatinine Clearance: 6.6 mL/min (A) (by C-G formula based on SCr of 5.52 mg/dL (H)). Liver Function Tests: Recent Labs  Lab 03/10/23 0400  AST 23  ALT 12  ALKPHOS 52  BILITOT 0.4  PROT 6.5  ALBUMIN 3.1*   Recent Labs  Lab 03/10/23 0400  LIPASE 52*   No results for input(s): "AMMONIA" in the last 168 hours. Coagulation profile Recent Labs  Lab 03/10/23 0637  INR 1.1    CBC: Recent Labs  Lab 03/10/23 0400 03/11/23 0848 03/12/23 0445 03/13/23 0400 03/14/23 0546  WBC 5.4 4.3 6.1 6.6 6.6  NEUTROABS 3.4  --  3.1 3.8 4.2  HGB 7.7* 6.6* 8.9* 9.5* 9.4*  HCT 24.2* 21.2* 26.0* 28.4* 28.2*  MCV 89.3 90.2 89.3 90.2 88.1  PLT 290 252 245 304 317   Cardiac Enzymes: No results for input(s): "CKTOTAL", "CKMB", "CKMBINDEX", "TROPONINI" in the last 168 hours. BNP: Invalid input(s): "POCBNP" CBG: Recent Labs  Lab 03/13/23 2035  GLUCAP 101*   D-Dimer No results for input(s): "DDIMER" in the last 72 hours. Hgb A1c No results for input(s): "HGBA1C" in the last 72 hours. Lipid Profile No results for input(s): "CHOL", "HDL", "LDLCALC", "TRIG", "CHOLHDL", "LDLDIRECT" in the last 72 hours. Thyroid function studies No results for input(s): "TSH", "T4TOTAL", "T3FREE", "THYROIDAB" in the last 72 hours.  Invalid input(s): "FREET3" Anemia work up No results for input(s): "VITAMINB12", "FOLATE", "FERRITIN", "TIBC", "IRON", "RETICCTPCT" in the last 72 hours. Microbiology No results found for this or any previous visit (from the past 240 hour(s)).  Time coordinating discharge: 45 minutes  Signed: Emalea Mix  [facility] 03/14/2023, 10:20 AM

## 2023-03-15 ENCOUNTER — Telehealth: Payer: Self-pay | Admitting: Physician Assistant

## 2023-03-15 NOTE — Discharge Planning (Signed)
[facility] Patient Discharge Orders- [facility] CLINIC: [place]  Patient's name: Ashley [name] Admit/DC Dates: 03/10/2023 - 03/14/2023  Discharge Diagnoses: Acute GI bleed   ESRD  Aranesp: Given: Yes   Date and amount of last dose: on 03/10/23  Last Hgb: 9.4 PRBC's Given: Yes Date/# of units: 1 unit on 03/11/23 ESA dose for discharge: mircera 100 mcg IV q 2 weeks  IV Iron dose at discharge: None  Heparin change: No, no heparin  EDW Change: No New EDW:   [place]ath Change: No  Access intervention/Change: No Details:  Hectorol/Calcitriol change: No  Discharge Labs: Calcium 7.6 Phosphorus 3.9 Albumin 3.1 K+ 4.0  IV Antibiotics: No Details:  On Coumadin?: No Last INR: Next INR: Managed By:   OTHER/APPTS/LAB ORDERS:    D/C Meds to be reconciled by nurse after every discharge.  Completed By: Rogers Blocker, PA-C 03/15/2023, 11:00 AM  [facility] Pager: [phone]    Reviewed by: MD:______ RN_______

## 2023-03-15 NOTE — Telephone Encounter (Signed)
Transition of Care - Initial Contact from Inpatient Facility  Date of discharge: 03/14/23 Date of contact: 10.6.24  Method: Phone Spoke to: Patient  Patient contacted to discuss transition of care from recent inpatient hospitalization. Patient was admitted to [facility] with discharge diagnosis of acute GI bleed. Patient reports she is doing well with no further bleeding. Reports she is able to ambulate and is independent of ADLs. She lives with her daughter who helps her as needed.   The discharge medication list was reviewed. Patient understands the changes and has no concerns.   Patient will return to his/her outpatient HD unit on: 03/16/23  No other concerns at this time.  Rogers Blocker, PA-C 03/15/2023, 11:05 AM  [facility] Pager: [phone]

## 2023-03-16 ENCOUNTER — Encounter (HOSPITAL_COMMUNITY): Payer: Self-pay | Admitting: Gastroenterology

## 2023-03-16 NOTE — Progress Notes (Signed)
Late Note Entry- March 16, 2023  Pt was d/c on Saturday. Contacted [facility] this morning to advise clinic of pt's d/c date and that pt should resume care today.   Olivia Canter Renal Navigator [phone]

## 2023-03-17 LAB — SURGICAL PATHOLOGY

## 2023-04-24 ENCOUNTER — Emergency Department (HOSPITAL_COMMUNITY): Payer: 59

## 2023-04-24 ENCOUNTER — Other Ambulatory Visit: Payer: Self-pay

## 2023-04-24 ENCOUNTER — Emergency Department (HOSPITAL_COMMUNITY)
Admission: EM | Admit: 2023-04-24 | Discharge: 2023-04-24 | Disposition: A | Discharge status: Home / Self Care | Payer: 59 | Attending: Emergency Medicine | Admitting: Emergency Medicine

## 2023-04-24 DIAGNOSIS — M25551 Pain in right hip: Secondary | ICD-10-CM | POA: Diagnosis not present

## 2023-04-24 DIAGNOSIS — N186 End stage renal disease: Secondary | ICD-10-CM | POA: Insufficient documentation

## 2023-04-24 DIAGNOSIS — Z992 Dependence on renal dialysis: Secondary | ICD-10-CM | POA: Insufficient documentation

## 2023-04-24 DIAGNOSIS — R197 Diarrhea, unspecified: Secondary | ICD-10-CM | POA: Diagnosis not present

## 2023-04-24 DIAGNOSIS — Z20822 Contact with and (suspected) exposure to covid-19: Secondary | ICD-10-CM | POA: Diagnosis not present

## 2023-04-24 DIAGNOSIS — Z79899 Other long term (current) drug therapy: Secondary | ICD-10-CM | POA: Diagnosis not present

## 2023-04-24 DIAGNOSIS — R1084 Generalized abdominal pain: Secondary | ICD-10-CM

## 2023-04-24 DIAGNOSIS — I120 Hypertensive chronic kidney disease with stage 5 chronic kidney disease or end stage renal disease: Secondary | ICD-10-CM | POA: Diagnosis not present

## 2023-04-24 DIAGNOSIS — E875 Hyperkalemia: Secondary | ICD-10-CM | POA: Diagnosis not present

## 2023-04-24 LAB — COMPREHENSIVE METABOLIC PANEL
ALT: 10 U/L (ref 0–44)
AST: 14 U/L — ABNORMAL LOW (ref 15–41)
Albumin: 3 g/dL — ABNORMAL LOW (ref 3.5–5.0)
Alkaline Phosphatase: 53 U/L (ref 38–126)
Anion gap: 16 — ABNORMAL HIGH (ref 5–15)
BUN: 35 mg/dL — ABNORMAL HIGH (ref 8–23)
CO2: 25 mmol/L (ref 22–32)
Calcium: 9.6 mg/dL (ref 8.9–10.3)
Chloride: 97 mmol/L — ABNORMAL LOW (ref 98–111)
Creatinine, Ser: 8.19 mg/dL — ABNORMAL HIGH (ref 0.44–1.00)
GFR, Estimated: 5 mL/min — ABNORMAL LOW (ref >60)
Glucose, Bld: 91 mg/dL (ref 70–99)
Potassium: 5.3 mmol/L — ABNORMAL HIGH (ref 3.5–5.1)
Sodium: 138 mmol/L (ref 135–145)
Total Bilirubin: 0.7 mg/dL (ref <1.2)
Total Protein: 6.6 g/dL (ref 6.5–8.1)

## 2023-04-24 LAB — CBC WITH DIFFERENTIAL/PLATELET
Abs Immature Granulocytes: 0.02 10*3/uL (ref 0.00–0.07)
Basophils Absolute: 0.1 10*3/uL (ref 0.0–0.1)
Basophils Relative: 1 %
Eosinophils Absolute: 0.2 10*3/uL (ref 0.0–0.5)
Eosinophils Relative: 4 %
HCT: 32.8 % — ABNORMAL LOW (ref 36.0–46.0)
Hemoglobin: 10.1 g/dL — ABNORMAL LOW (ref 12.0–15.0)
Immature Granulocytes: 0 %
Lymphocytes Relative: 18 %
Lymphs Abs: 1 10*3/uL (ref 0.7–4.0)
MCH: 27.4 pg (ref 26.0–34.0)
MCHC: 30.8 g/dL (ref 30.0–36.0)
MCV: 89.1 fL (ref 80.0–100.0)
Monocytes Absolute: 0.9 10*3/uL (ref 0.1–1.0)
Monocytes Relative: 15 %
Neutro Abs: 3.7 10*3/uL (ref 1.7–7.7)
Neutrophils Relative %: 62 %
Platelets: 298 10*3/uL (ref 150–400)
RBC: 3.68 MIL/uL — ABNORMAL LOW (ref 3.87–5.11)
RDW: 18 % — ABNORMAL HIGH (ref 11.5–15.5)
WBC: 5.9 10*3/uL (ref 4.0–10.5)
nRBC: 0 % (ref 0.0–0.2)

## 2023-04-24 LAB — I-STAT CG4 LACTIC ACID, ED
Lactic Acid, Venous: 1 mmol/L (ref 0.5–1.9)
Lactic Acid, Venous: 1.2 mmol/L (ref 0.5–1.9)

## 2023-04-24 LAB — LIPASE, BLOOD: Lipase: 62 U/L — ABNORMAL HIGH (ref 11–51)

## 2023-04-24 LAB — SARS CORONAVIRUS 2 BY RT PCR: SARS Coronavirus 2 by RT PCR: NEGATIVE

## 2023-04-24 MED ORDER — HYDROMORPHONE HCL 1 MG/ML IJ SOLN
0.2500 mg | Freq: Once | INTRAMUSCULAR | Status: AC
  Administered 2023-04-24: 0.25 mg via INTRAVENOUS
  Filled 2023-04-24: qty 1

## 2023-04-24 MED ORDER — ONDANSETRON HCL 4 MG/2ML IJ SOLN
4.0000 mg | Freq: Once | INTRAMUSCULAR | Status: AC
  Administered 2023-04-24: 4 mg via INTRAVENOUS
  Filled 2023-04-24: qty 2

## 2023-04-24 MED ORDER — IOHEXOL 350 MG/ML SOLN
60.0000 mL | Freq: Once | INTRAVENOUS | Status: AC | PRN
  Administered 2023-04-24: 60 mL via INTRAVENOUS

## 2023-04-24 MED ORDER — DICYCLOMINE HCL 10 MG PO CAPS
10.0000 mg | ORAL_CAPSULE | Freq: Once | ORAL | Status: AC
  Administered 2023-04-24: 10 mg via ORAL
  Filled 2023-04-24: qty 1

## 2023-04-24 NOTE — ED Provider Notes (Signed)
Patient initially seen by Dr. Kathe Mariner.  Please see her note.  Patient presented with complaints of loose stools and abdominal discomfort.  Patient's labs without any significant abnormalities.  Hemoglobin was stable.  BUN/creatinine consistent with her chronic kidney disease.  Patient CT scan did not show any acute abnormalities.  Chest x-ray without acute process.  Evaluation and diagnostic testing in the emergency department does not suggest an emergent condition requiring admission or immediate intervention beyond what has been performed at this time.  The patient is safe for discharge and has been instructed to return immediately for worsening symptoms, change in symptoms or any other concerns.     Linwood Dibbles, MD 04/24/23 Barry Brunner

## 2023-04-24 NOTE — ED Notes (Signed)
Order placed for IV team due to difficult stick, Jearld Fenton, MD notified.

## 2023-04-24 NOTE — ED Triage Notes (Signed)
Patient BIB EMS from home with c/o ABD pain,diarrhea, right hip pain, and weakness x4 days. Dialysis (M/W/F) missed today. No vomiting. Changed about x3 time from being wet. Temp 100.4. 148/84, 78, 16, 98%ra CBG 123.

## 2023-04-24 NOTE — ED Notes (Signed)
Pt was statting around the mid 80s, pt was placed on oxygen via nasal cannula at 3 lpm. Pt was observed and O2 stat increased to 99%. Pt resting comfortably.

## 2023-04-24 NOTE — Discharge Instructions (Signed)
Blood test and CT scan did not show any signs of acute infection or obstruction.  Follow-up with your doctor next week to be rechecked if the symptoms persist.

## 2023-04-24 NOTE — ED Provider Notes (Signed)
[place] [facility]ERGENC[facility] DEPARTMENT AT [facility] Provider Note   CSN: [id number] Arrival date & time: 04/24/23  1040     History  Chief Complaint  Patient presents with   Diarrhea   Weakness   Hip Pain    Right     Ashley [name] is a 72 y.o. female with PMH as listed below who presents BIB EMS from home with c/o ABD pain, diarrhea, right hip pain, and weakness x4 days. Diarrhea for 3 days, about 2 BMs per day. Abd pain is located on right side and right flank, constant, rated 8/10, feels somewhat similar to previous episode of pancreatitis. States R hip pain is more like RLQ pain. Doesn't drink alcohol. Dialysis (M/W/F) missed today. No nausea/vomiting, hematochezia/melena, urinary sxs, vaginal sxs. Doesn't make urine d/t dialysis. Changed her gown about x3 times just last night from being wet with sweat overnight. Temp 100.4 F. 148/84, 78, 16, 98%ra CBG 123.   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  genotype 2B.  Epclusa started early 04/2020.     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  Chronic microvascular ischemic changes w parenchymal volume loss. Chronic white matter periventricular microhemorrhage, likely due to htn   Ulcer 1990   gastric ulcer. Ruptured s/p emergency repair       Home Medications Prior to Admission medications   Medication Sig Start Date End Date Taking? Authorizing Provider   amLODipine (NORVASC) 10 MG tablet Take 1 tablet (10 mg total) by mouth daily. Patient taking differently: Take 10 mg by mouth daily at 12 noon. 11/11/16  Yes Pincus Large, DO  CREON 12000-38000 units CPEP capsule Take by mouth. 03/02/23  Yes [provider]  gabapentin (NEURONTIN) 100 MG capsule Take 200 mg by mouth at bedtime.   Yes [provider]  hydrALAZINE (APRESOLINE) 25 MG tablet Take 3 tablets (75 mg total) by mouth every 8 (eight) hours. 11/14/20 03/09/24 Yes [place], Eric J, DO  pantoprazole (PROTONIX) 40 MG tablet Take 1 tablet (40 mg total) by mouth daily. 03/05/23 06/03/23 Yes Barrett, Horald Chestnut, PA-C  sevelamer carbonate (RENVELA) 800 MG tablet Take 800 mg by mouth 3 (three) times daily with meals. 07/23/20  Yes [provider]      Allergies    Aspirin and Ibuprofen    Review of Systems   Review of Systems A 10 point review of systems was performed and is negative unless otherwise reported in HPI.  Physical Exam Updated Vital Signs BP (!) 153/67   Pulse 69   Temp 98.3 F (36.8 C) (Oral)   Resp 16   SpO2 94%  Physical Exam General: Normal appearing female, lying in bed.  HEENT: Sclera anicteric, MMM, trachea midline.  Cardiology: RRR, no murmurs/rubs/gallops.  Resp: Normal respiratory rate and effort. CTAB, no wheezes, rhonchi,  crackles.  Abd: Soft, diffusely tender abdomen, mostly epigastric, RLQ, LLQ. non-distended. No rebound tenderness or guarding.  GU: Deferred. MSK: No peripheral edema or signs of trauma. Extremities without deformity or TTP. FROM of R hip without TTP.  Skin: warm, dry.  Back: +R CVA tenderness Neuro: A&Ox4, CNs II-XII grossly intact. MAEs. Sensation grossly intact.  Psych: Normal mood and affect.   ED Results / Procedures / Treatments   Labs (all labs ordered are listed, but only abnormal results are displayed) Labs Reviewed  CBC WITH DIFFERENTIAL/PLATELET - Abnormal; Notable for the following components:       Result Value   RBC 3.68 (*)    Hemoglobin 10.1 (*)    HCT 32.8 (*)    RDW 18.0 (*)    All other components within normal limits  COMPREHENSIVE METABOLIC PANEL - Abnormal; Notable for the following components:   Potassium 5.3 (*)    Chloride 97 (*)    BUN 35 (*)    Creatinine, Ser 8.19 (*)    Albumin 3.0 (*)    AST 14 (*)    GFR, Estimated 5 (*)    Anion gap 16 (*)    All other components within normal limits  LIPASE, BLOOD - Abnormal; Notable for the following components:   Lipase 62 (*)    All other components within normal limits  SARS CORONAVIRUS 2 BY RT PCR  URINALYSIS, W/ REFLEX TO CULTURE (INFECTION SUSPECTED)  I-STAT CG4 LACTIC ACID, ED  I-STAT CG4 LACTIC ACID, ED    EKG EKG Interpretation Date/Time:  Friday April 24 2023 11:45:42 EST Ventricular Rate:  69 PR Interval:  84 QRS Duration:  83 QT Interval:  412 QTC Calculation: 442 R Axis:   -13  Text Interpretation: Sinus l rhythm Ventricular premature complex Short PR interval Confirmed by Vivi Barrack [phone]) on 04/24/2023 1:04:17 PM  Radiology DG Chest Portable 1 View  Result Date: 04/24/2023 CLINICAL DATA:  Right flank pain. EXAM: PORTABLE CHEST 1 VIEW COMPARISON:  09/02/2022. FINDINGS: Bilateral lung fields are clear. No acute consolidation or lung collapse. No frank pulmonary edema. Bilateral costophrenic angles are clear. Normal cardio-mediastinal silhouette. No acute osseous abnormalities. The soft tissues are within normal limits. IMPRESSION: *No active disease. Electronically Signed   By: Jules Schick M.D.   On: 04/24/2023 14:49   DG Pelvis Portable  Result Date: 04/24/2023 CLINICAL DATA:  Right hip pain. EXAM: PORTABLE PELVIS 1-2 VIEWS COMPARISON:  09/14/2019. FINDINGS: Pelvis is intact with normal and symmetric sacroiliac joints. No acute fracture or dislocation. No aggressive osseous lesion. Visualized sacral arcuate lines are unremarkable. There are changes of chronic pubic symphisitis. There are  mild degenerative changes of bilateral hip joints without significant joint space narrowing. Osteophytosis of the superior acetabulum. No radiopaque foreign bodies. IMPRESSION: *No acute osseous abnormality of the pelvis. Electronically Signed   By: Jules Schick M.D.   On: 04/24/2023 14:46    Procedures Procedures    Medications Ordered in ED Medications  HYDROmorphone (DILAUDID) injection 0.25 mg (0.25 mg Intravenous Given 04/24/23 1457)  ondansetron (ZOFRAN) injection 4 mg (4 mg Intravenous Given 04/24/23 1457)    ED Course/ Medical Decision Making/ A&P                          Medical Decision Making Amount and/or Complexity of Data Reviewed Labs: ordered. Decision-making details documented in ED Course. Radiology: ordered. Decision-making details documented in ED Course.  Risk Prescription drug management.  This patient presents to the ED for concern of abd pain/diarrhea, this involves an extensive number of treatment options, and is a complaint that carries with it a high risk of complications and morbidity.  I considered the following differential and admission for this acute, potentially life threatening condition.   MDM:    For DDX for abdominal pain includes but is not limited to:  Abdominal exam without peritoneal signs. No evidence of acute abdomen at this time. Consider pancreatitis, acute hepatobiliary disease (including acute cholecystitis), appendicitis, nephrolithiasis, pyelonephritis. Also considered but lower on ddx is PUD (including gastric perforation), pneumonia, bowel obstruction, diverticulitis. Consider also electrolyte derangements, dehydration. Pt w/ ESRD and diarrhea, consider electrolyte derangements, dehydration. She has had no travel/hosptializations to raise c/f c diff. She is HDS, no c/f severe hypovolemia/shock. Afebrile which is reassuring and no hematochezia/melena. Consider gastroenteritis/colitis as well, lower c/f bacterial cause of diarrhea.  Will get labs and CT abd pelvis.   Clinical Course as of 04/24/23 1607  Fri Apr 24, 2023  1309 SARS Coronavirus 2 by RT PCR: NEGATIVE neg [HN]  1405 WBC: 5.9 No leukocytosis  [HN]  1405 Lactic Acid, Venous: 1.0 wnl [HN]  1536 Lipase(!): 62 Mildly elevated but not significantly so [HN]  1536 Potassium(!): 5.3 Mild hyperkalemia [HN]  1536 Creatinine(!): 8.19 Known ESRD [HN]  1536 DG Chest Portable 1 View Clear CXR [HN]  1536 SARS Coronavirus 2 by RT PCR: NEGATIVE Neg  [HN]    Clinical Course User Index [HN] Loetta Rough, MD    Labs: I Ordered, and personally interpreted labs.  The pertinent results include:  those lsited above  Imaging Studies ordered: I ordered imaging studies including PXR, CT abd pelvis I independently visualized and interpreted imaging. I agree with the radiologist interpretation  Additional history obtained from chart review.   Cardiac Monitoring: The patient was maintained on a cardiac monitor.  I personally viewed and interpreted the cardiac monitored which showed an underlying rhythm of: NSR  Reevaluation: After the interventions noted above, I reevaluated the patient and found that they have :improved  Social Determinants of Health: Lives independently  Disposition:  Patient is signed out to the oncoming ED physician who is made aware of her history, presentation, exam, workup, and plan. Plan is to obtain CT abd pelvis, determine dispo, possibly engage with nephrology if needs to be admitted.   Co morbidities that complicate the patient evaluation  Past Medical History:  Diagnosis Date   Acute pancreatitis 2000   2000, 12/2018, 08/2019   Arthritis    Cervical radiculopathy 02/28/2011   Cocaine substance abuse (HCC) 05/26/2013   positive UDS    Duodenitis    Erosive gastropathy    ESRD on hemodialysis (HCC)    TTS   GERD (gastroesophageal reflux disease)    Hepatitis C 1987   dt hx IVDA.  genotype 2B.  Epclusa started early  04/2020.     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  Chronic microvascular ischemic changes w parenchymal volume loss. Chronic white matter periventricular microhemorrhage, likely due to htn   Ulcer 1990   gastric ulcer. Ruptured s/p emergency repair     Medicines Meds ordered this encounter  Medications  HYDROmorphone (DILAUDID) injection 0.25 mg   ondansetron (ZOFRAN) injection 4 mg    I have reviewed the patients home medicines and have made adjustments as needed  Problem List / ED Course: Problem List Items Addressed This Visit       Other   Abdominal pain - Primary                This note was created using dictation software, which may contain spelling or grammatical errors.    Loetta Rough, MD 04/24/23 [phone]

## 2023-05-15 ENCOUNTER — Emergency Department (HOSPITAL_COMMUNITY)
Admission: EM | Admit: 2023-05-15 | Discharge: 2023-05-16 | Disposition: A | Discharge status: Home / Self Care | Payer: 59 | Attending: Emergency Medicine | Admitting: Emergency Medicine

## 2023-05-15 ENCOUNTER — Emergency Department (HOSPITAL_COMMUNITY): Payer: 59

## 2023-05-15 ENCOUNTER — Other Ambulatory Visit: Payer: Self-pay

## 2023-05-15 DIAGNOSIS — D649 Anemia, unspecified: Secondary | ICD-10-CM | POA: Diagnosis not present

## 2023-05-15 DIAGNOSIS — R1084 Generalized abdominal pain: Secondary | ICD-10-CM | POA: Diagnosis not present

## 2023-05-15 DIAGNOSIS — N186 End stage renal disease: Secondary | ICD-10-CM | POA: Insufficient documentation

## 2023-05-15 DIAGNOSIS — Z992 Dependence on renal dialysis: Secondary | ICD-10-CM | POA: Insufficient documentation

## 2023-05-15 DIAGNOSIS — R109 Unspecified abdominal pain: Secondary | ICD-10-CM | POA: Diagnosis present

## 2023-05-15 LAB — COMPREHENSIVE METABOLIC PANEL
ALT: 12 U/L (ref 0–44)
AST: 28 U/L (ref 15–41)
Albumin: 3.2 g/dL — ABNORMAL LOW (ref 3.5–5.0)
Alkaline Phosphatase: 62 U/L (ref 38–126)
Anion gap: 14 (ref 5–15)
BUN: 29 mg/dL — ABNORMAL HIGH (ref 8–23)
CO2: 27 mmol/L (ref 22–32)
Calcium: 9.6 mg/dL (ref 8.9–10.3)
Chloride: 97 mmol/L — ABNORMAL LOW (ref 98–111)
Creatinine, Ser: 6.49 mg/dL — ABNORMAL HIGH (ref 0.44–1.00)
GFR, Estimated: 6 mL/min — ABNORMAL LOW (ref >60)
Glucose, Bld: 94 mg/dL (ref 70–99)
Potassium: 5.8 mmol/L — ABNORMAL HIGH (ref 3.5–5.1)
Sodium: 138 mmol/L (ref 135–145)
Total Bilirubin: 1.1 mg/dL (ref <1.2)
Total Protein: 7.1 g/dL (ref 6.5–8.1)

## 2023-05-15 LAB — LIPASE, BLOOD: Lipase: 64 U/L — ABNORMAL HIGH (ref 11–51)

## 2023-05-15 NOTE — ED Provider Notes (Signed)
[facility] Emergency Department Provider Note MRN:  [id number]  Arrival date & time: 05/16/23     Chief Complaint   Abdominal Pain   History of Present Illness   Ashley [name] is a 72 y.o. year-old female presents to the ED with chief complaint of abdominal pain.  She states that she has "troubles" with her pancreas.  She denies any ETOH use.  She has hx of ESRD and is on HD MWF.  Last dialysis was today.  She denies vomiting.  States that this feels similar to prior episodes of pancreatitis.  States that the pain is all over her abdomen.  Denies any other associated symptoms.  History provided by patient.   Review of Systems  Pertinent positive and negative review of systems noted in HPI.    Physical Exam   Vitals:   05/15/23 2204 05/16/23 0154  BP: (!) 147/63 (!) 152/71  Pulse: 81 80  Resp: 20 18  Temp: 99 F (37.2 C) 98.6 F (37 C)  SpO2: 95% 94%    CONSTITUTIONAL:  non toxic-appearing, NAD NEURO:  Alert and oriented x 3, CN 3-12 grossly intact EYES:  eyes equal and reactive ENT/NECK:  Supple, no stridor  CARDIO:  normal rate, regular rhythm, appears well-perfused  PULM:  No respiratory distress, CTAB GI/GU:  non-distended, generalized abdominal tenderness MSK/SPINE:  No gross deformities, no edema, moves all extremities  SKIN:  no rash, atraumatic   *Additional and/or pertinent findings included in MDM below  Diagnostic and Interventional Summary    EKG Interpretation Date/Time:    Ventricular Rate:    PR Interval:    QRS Duration:    QT Interval:    QTC Calculation:   R Axis:      Text Interpretation:         Labs Reviewed  COMPREHENSIVE METABOLIC PANEL - Abnormal; Notable for the following components:      Result Value   Potassium 5.8 (*)    Chloride 97 (*)    BUN 29 (*)    Creatinine, Ser 6.49 (*)    Albumin 3.2 (*)    GFR, Estimated 6 (*)    All other components within normal limits  LIPASE, BLOOD - Abnormal;  Notable for the following components:   Lipase 64 (*)    All other components within normal limits  CBC WITH DIFFERENTIAL/PLATELET - Abnormal; Notable for the following components:   Hemoglobin 11.5 (*)    RDW 18.6 (*)    Monocytes Absolute 1.2 (*)    All other components within normal limits  POTASSIUM  CBC WITH DIFFERENTIAL/PLATELET    CT ABDOMEN PELVIS WO CONTRAST  Final Result    DG Chest 2 View  Final Result      Medications  oxyCODONE-acetaminophen (PERCOCET/ROXICET) 5-325 MG per tablet 1 tablet (1 tablet Oral Given 05/16/23 0053)     Procedures  /  Critical Care Procedures  ED Course and Medical Decision Making  I have reviewed the triage vital signs, the nursing notes, and pertinent available records from the EMR.  Social Determinants Affecting Complexity of Care: Patient has no clinically significant social determinants affecting this chief complaint..   ED Course: Clinical Course as of 05/16/23 0222  Sat May 16, 2023  0215 Lipase, blood(!) Lipase is about baseline, but will check CT. [RB]  0215 Comprehensive metabolic panel(!) K is hemolyzed, will repeat K.  [RB]  0216 Potassium [RB]  0216 CBC with Differential/Platelet(!) No leukocytosis.  Baseline anemia. [RB]  4782 Patient feeling improved after percocet.  Labs and imaging are reassuring.  It doesn't appear that patient has a condition that needs hospitalization.  Will plan for discharge and outpatient follow-up.  Return precautions discussed. [RB]  0220 CT ABDOMEN PELVIS WO CONTRAST No evidence of pancreatitis on CT. [RB]    Clinical Course User Index [RB] Roxy Horseman, PA-C    Medical Decision Making Patient here with abdominal pain.  She is concerned that her pancreatitis is flaring up.  She denies any ETOH use.  Denies vomiting.  Labs and imaging are ordered.    Amount and/or Complexity of Data Reviewed Labs: ordered. Decision-making details documented in ED Course. Radiology: ordered.  Decision-making details documented in ED Course.  Risk Prescription drug management.         Consultants: No consultations were needed in caring for this patient.   Treatment and Plan: I considered admission due to patient's initial presentation, but after considering the examination and diagnostic results, patient will not require admission and can be discharged with outpatient follow-up.    Final Clinical Impressions(s) / ED Diagnoses     ICD-10-CM   1. Generalized abdominal pain  R10.84       ED Discharge Orders          Ordered    oxyCODONE-acetaminophen (PERCOCET) 5-325 MG tablet  Every 6 hours PRN        05/16/23 0134    sucralfate (CARAFATE) 1 g tablet  3 times daily with meals & bedtime        05/16/23 0134              Discharge Instructions Discussed with and Provided to Patient:   Discharge Instructions   None      Roxy Horseman, PA-C 05/16/23 Vesta Mixer, April, MD 05/16/23 [id number]

## 2023-05-15 NOTE — ED Triage Notes (Signed)
Pt BIB EMS from home. C/o generalized abd pain, reports poor intake d/t pain. Compliant with dialysis MWF.  Hx pancreatitis, states similar to previous flair up  EMS VS 142/72, HR 88, R 16, 94% RA cbg 167. Reports slight wheezing on R side

## 2023-05-16 ENCOUNTER — Other Ambulatory Visit (HOSPITAL_COMMUNITY): Payer: 59

## 2023-05-16 ENCOUNTER — Emergency Department (HOSPITAL_COMMUNITY): Payer: 59

## 2023-05-16 LAB — CBC WITH DIFFERENTIAL/PLATELET
Abs Immature Granulocytes: 0.03 10*3/uL (ref 0.00–0.07)
Basophils Absolute: 0.1 10*3/uL (ref 0.0–0.1)
Basophils Relative: 1 %
Eosinophils Absolute: 0.2 10*3/uL (ref 0.0–0.5)
Eosinophils Relative: 3 %
HCT: 36.6 % (ref 36.0–46.0)
Hemoglobin: 11.5 g/dL — ABNORMAL LOW (ref 12.0–15.0)
Immature Granulocytes: 0 %
Lymphocytes Relative: 17 %
Lymphs Abs: 1.2 10*3/uL (ref 0.7–4.0)
MCH: 27.3 pg (ref 26.0–34.0)
MCHC: 31.4 g/dL (ref 30.0–36.0)
MCV: 86.9 fL (ref 80.0–100.0)
Monocytes Absolute: 1.2 10*3/uL — ABNORMAL HIGH (ref 0.1–1.0)
Monocytes Relative: 17 %
Neutro Abs: 4.4 10*3/uL (ref 1.7–7.7)
Neutrophils Relative %: 62 %
Platelets: 335 10*3/uL (ref 150–400)
RBC: 4.21 MIL/uL (ref 3.87–5.11)
RDW: 18.6 % — ABNORMAL HIGH (ref 11.5–15.5)
WBC: 7.1 10*3/uL (ref 4.0–10.5)
nRBC: 0 % (ref 0.0–0.2)

## 2023-05-16 LAB — POTASSIUM: Potassium: 5 mmol/L (ref 3.5–5.1)

## 2023-05-16 MED ORDER — SUCRALFATE 1 G PO TABS: 1.0000 g | ORAL_TABLET | Freq: Three times a day (TID) | ORAL | 0 refills | Status: DC

## 2023-05-16 MED ORDER — OXYCODONE-ACETAMINOPHEN 5-325 MG PO TABS: 1.0000 | ORAL_TABLET | Freq: Four times a day (QID) | ORAL | 0 refills | Status: DC | PRN

## 2023-05-16 MED ORDER — OXYCODONE-ACETAMINOPHEN 5-325 MG PO TABS
1.0000 | ORAL_TABLET | Freq: Once | ORAL | Status: AC
  Administered 2023-05-16: 1 via ORAL
  Filled 2023-05-16: qty 1

## 2023-05-20 ENCOUNTER — Encounter (HOSPITAL_COMMUNITY): Payer: Self-pay

## 2023-05-20 ENCOUNTER — Inpatient Hospital Stay (HOSPITAL_COMMUNITY)
Admission: EM | Admit: 2023-05-20 | Discharge: 2023-05-28 | DRG: 391 | Disposition: A | Discharge status: Home Health | Payer: 59 | Attending: Internal Medicine | Admitting: Internal Medicine

## 2023-05-20 ENCOUNTER — Emergency Department (HOSPITAL_COMMUNITY): Payer: 59

## 2023-05-20 ENCOUNTER — Other Ambulatory Visit: Payer: Self-pay

## 2023-05-20 DIAGNOSIS — I120 Hypertensive chronic kidney disease with stage 5 chronic kidney disease or end stage renal disease: Secondary | ICD-10-CM | POA: Diagnosis present

## 2023-05-20 DIAGNOSIS — Z992 Dependence on renal dialysis: Secondary | ICD-10-CM

## 2023-05-20 DIAGNOSIS — Z8619 Personal history of other infectious and parasitic diseases: Secondary | ICD-10-CM | POA: Diagnosis not present

## 2023-05-20 DIAGNOSIS — E872 Acidosis, unspecified: Secondary | ICD-10-CM | POA: Diagnosis present

## 2023-05-20 DIAGNOSIS — R636 Underweight: Secondary | ICD-10-CM | POA: Diagnosis present

## 2023-05-20 DIAGNOSIS — N186 End stage renal disease: Secondary | ICD-10-CM | POA: Diagnosis present

## 2023-05-20 DIAGNOSIS — Z79899 Other long term (current) drug therapy: Secondary | ICD-10-CM

## 2023-05-20 DIAGNOSIS — E278 Other specified disorders of adrenal gland: Secondary | ICD-10-CM | POA: Diagnosis present

## 2023-05-20 DIAGNOSIS — E875 Hyperkalemia: Secondary | ICD-10-CM | POA: Diagnosis present

## 2023-05-20 DIAGNOSIS — K219 Gastro-esophageal reflux disease without esophagitis: Secondary | ICD-10-CM | POA: Diagnosis present

## 2023-05-20 DIAGNOSIS — Z83438 Family history of other disorder of lipoprotein metabolism and other lipidemia: Secondary | ICD-10-CM

## 2023-05-20 DIAGNOSIS — K529 Noninfective gastroenteritis and colitis, unspecified: Secondary | ICD-10-CM | POA: Diagnosis not present

## 2023-05-20 DIAGNOSIS — K859 Acute pancreatitis without necrosis or infection, unspecified: Secondary | ICD-10-CM | POA: Diagnosis not present

## 2023-05-20 DIAGNOSIS — E871 Hypo-osmolality and hyponatremia: Secondary | ICD-10-CM | POA: Diagnosis present

## 2023-05-20 DIAGNOSIS — A084 Viral intestinal infection, unspecified: Secondary | ICD-10-CM | POA: Diagnosis present

## 2023-05-20 DIAGNOSIS — E785 Hyperlipidemia, unspecified: Secondary | ICD-10-CM | POA: Diagnosis present

## 2023-05-20 DIAGNOSIS — Z681 Body mass index (BMI) 19 or less, adult: Secondary | ICD-10-CM

## 2023-05-20 DIAGNOSIS — Z8711 Personal history of peptic ulcer disease: Secondary | ICD-10-CM

## 2023-05-20 DIAGNOSIS — I10 Essential (primary) hypertension: Secondary | ICD-10-CM | POA: Diagnosis not present

## 2023-05-20 DIAGNOSIS — R1084 Generalized abdominal pain: Secondary | ICD-10-CM | POA: Diagnosis present

## 2023-05-20 DIAGNOSIS — R109 Unspecified abdominal pain: Secondary | ICD-10-CM | POA: Diagnosis present

## 2023-05-20 DIAGNOSIS — Z803 Family history of malignant neoplasm of breast: Secondary | ICD-10-CM

## 2023-05-20 DIAGNOSIS — K861 Other chronic pancreatitis: Secondary | ICD-10-CM | POA: Diagnosis present

## 2023-05-20 DIAGNOSIS — Z860101 Personal history of adenomatous and serrated colon polyps: Secondary | ICD-10-CM

## 2023-05-20 DIAGNOSIS — N2581 Secondary hyperparathyroidism of renal origin: Secondary | ICD-10-CM | POA: Diagnosis present

## 2023-05-20 DIAGNOSIS — Z716 Tobacco abuse counseling: Secondary | ICD-10-CM | POA: Diagnosis not present

## 2023-05-20 DIAGNOSIS — Z886 Allergy status to analgesic agent status: Secondary | ICD-10-CM

## 2023-05-20 DIAGNOSIS — Z90710 Acquired absence of both cervix and uterus: Secondary | ICD-10-CM | POA: Diagnosis not present

## 2023-05-20 DIAGNOSIS — F17210 Nicotine dependence, cigarettes, uncomplicated: Secondary | ICD-10-CM | POA: Diagnosis present

## 2023-05-20 DIAGNOSIS — D631 Anemia in chronic kidney disease: Secondary | ICD-10-CM | POA: Diagnosis present

## 2023-05-20 DIAGNOSIS — R54 Age-related physical debility: Secondary | ICD-10-CM | POA: Diagnosis present

## 2023-05-20 DIAGNOSIS — Z8673 Personal history of transient ischemic attack (TIA), and cerebral infarction without residual deficits: Secondary | ICD-10-CM

## 2023-05-20 DIAGNOSIS — Z8249 Family history of ischemic heart disease and other diseases of the circulatory system: Secondary | ICD-10-CM | POA: Diagnosis not present

## 2023-05-20 DIAGNOSIS — Z91158 Patient's noncompliance with renal dialysis for other reason: Secondary | ICD-10-CM

## 2023-05-20 DIAGNOSIS — R935 Abnormal findings on diagnostic imaging of other abdominal regions, including retroperitoneum: Secondary | ICD-10-CM | POA: Diagnosis not present

## 2023-05-20 DIAGNOSIS — R1010 Upper abdominal pain, unspecified: Secondary | ICD-10-CM | POA: Diagnosis not present

## 2023-05-20 LAB — CBC
HCT: 39.2 % (ref 36.0–46.0)
Hemoglobin: 12.1 g/dL (ref 12.0–15.0)
MCH: 26.5 pg (ref 26.0–34.0)
MCHC: 30.9 g/dL (ref 30.0–36.0)
MCV: 85.8 fL (ref 80.0–100.0)
Platelets: 314 10*3/uL (ref 150–400)
RBC: 4.57 MIL/uL (ref 3.87–5.11)
RDW: 18.6 % — ABNORMAL HIGH (ref 11.5–15.5)
WBC: 5.8 10*3/uL (ref 4.0–10.5)
nRBC: 0 % (ref 0.0–0.2)

## 2023-05-20 LAB — COMPREHENSIVE METABOLIC PANEL
ALT: 7 U/L (ref 0–44)
AST: 17 U/L (ref 15–41)
Albumin: 3.3 g/dL — ABNORMAL LOW (ref 3.5–5.0)
Alkaline Phosphatase: 56 U/L (ref 38–126)
Anion gap: 18 — ABNORMAL HIGH (ref 5–15)
BUN: 42 mg/dL — ABNORMAL HIGH (ref 8–23)
CO2: 24 mmol/L (ref 22–32)
Calcium: 8.8 mg/dL — ABNORMAL LOW (ref 8.9–10.3)
Chloride: 95 mmol/L — ABNORMAL LOW (ref 98–111)
Creatinine, Ser: 10.71 mg/dL — ABNORMAL HIGH (ref 0.44–1.00)
GFR, Estimated: 3 mL/min — ABNORMAL LOW (ref >60)
Glucose, Bld: 86 mg/dL (ref 70–99)
Potassium: 4.9 mmol/L (ref 3.5–5.1)
Sodium: 137 mmol/L (ref 135–145)
Total Bilirubin: 0.7 mg/dL (ref <1.2)
Total Protein: 7 g/dL (ref 6.5–8.1)

## 2023-05-20 LAB — LIPASE, BLOOD: Lipase: 65 U/L — ABNORMAL HIGH (ref 11–51)

## 2023-05-20 MED ORDER — PANTOPRAZOLE SODIUM 40 MG IV SOLR
40.0000 mg | Freq: Once | INTRAVENOUS | Status: AC
  Administered 2023-05-20: 40 mg via INTRAVENOUS
  Filled 2023-05-20: qty 10

## 2023-05-20 MED ORDER — ONDANSETRON HCL 4 MG/2ML IJ SOLN
4.0000 mg | Freq: Once | INTRAMUSCULAR | Status: AC
  Administered 2023-05-20: 4 mg via INTRAVENOUS
  Filled 2023-05-20: qty 2

## 2023-05-20 MED ORDER — ALUM & MAG HYDROXIDE-SIMETH 200-200-20 MG/5ML PO SUSP
30.0000 mL | Freq: Once | ORAL | Status: AC
  Administered 2023-05-20: 30 mL via ORAL
  Filled 2023-05-20: qty 30

## 2023-05-20 MED ORDER — FENTANYL CITRATE PF 50 MCG/ML IJ SOSY
25.0000 ug | PREFILLED_SYRINGE | Freq: Once | INTRAMUSCULAR | Status: AC
  Administered 2023-05-20: 25 ug via INTRAVENOUS
  Filled 2023-05-20: qty 1

## 2023-05-20 MED ORDER — FENTANYL CITRATE PF 50 MCG/ML IJ SOSY
50.0000 ug | PREFILLED_SYRINGE | Freq: Once | INTRAMUSCULAR | Status: AC
  Administered 2023-05-20: 50 ug via INTRAVENOUS
  Filled 2023-05-20: qty 1

## 2023-05-20 NOTE — ED Provider Notes (Signed)
EMERGENCY DEPARTMENT AT [facility] Provider Note   CSN: [id number] Arrival date & time: 05/20/23  1933     History  Chief Complaint  Patient presents with   Abdominal Pain    Ashley [name] is a 72 y.o. female.  She has PMH of ESRD on dialysis Monday Wednesday Friday, pancreatitis, CVA, hypertension.  Zentz to ER for several weeks of diffuse abdominal pain, was seen on 12/6 and 11/15 for the same with reassuring workup including no acute findings on CT on 05/15/2023.  Patient was admitted for GI bleeding at the beginning of October and had colonoscopy and endoscopy on 10/4 revealing polyps and an inflammatory nodule of the stomach but no other acute findings. Patient notes she did not dialyze today due to her feeling unwell, did dialyze on Monday.  Denies alcohol use, denies drug use, denies chest pain or shortness of breath.  Abdominal Pain      Home Medications Prior to Admission medications   Medication Sig Start Date End Date Taking? Authorizing Provider  amLODipine (NORVASC) 10 MG tablet Take 1 tablet (10 mg total) by mouth daily. Patient taking differently: Take 10 mg by mouth daily at 12 noon. 11/11/16   Pincus Large, DO  CREON 12000-38000 units CPEP capsule Take by mouth. 03/02/23   [provider]  gabapentin (NEURONTIN) 100 MG capsule Take 200 mg by mouth at bedtime.    [provider]  hydrALAZINE (APRESOLINE) 25 MG tablet Take 3 tablets (75 mg total) by mouth every 8 (eight) hours. 11/14/20 03/09/24  [place], Alvira Philips, DO  oxyCODONE-acetaminophen (PERCOCET) 5-325 MG tablet Take 1-2 tablets by mouth every 6 (six) hours as needed. 05/16/23   Roxy Horseman, PA-C  pantoprazole (PROTONIX) 40 MG tablet Take 1 tablet (40 mg total) by mouth daily. 03/05/23 06/03/23  Barrett, Horald Chestnut, PA-C  sevelamer carbonate (RENVELA) 800 MG tablet Take 800 mg by mouth 3 (three) times daily with meals. 07/23/20   [provider]  sucralfate (CARAFATE)  1 g tablet Take 1 tablet (1 g total) by mouth 4 (four) times daily -  with meals and at bedtime. 05/16/23   Roxy Horseman, PA-C      Allergies    Aspirin and Ibuprofen    Review of Systems   Review of Systems  Gastrointestinal:  Positive for abdominal pain.    Physical Exam Updated Vital Signs BP (!) 162/66   Pulse 69   Temp 97.7 F (36.5 C) (Oral)   Resp 18   Ht 5\' 2"  (1.575 m)   Wt 46.7 kg   SpO2 93%   BMI 18.84 kg/m  Physical Exam Vitals and nursing note reviewed.  Constitutional:      General: She is not in acute distress.    Appearance: She is well-developed.  HENT:     Head: Normocephalic and atraumatic.  Eyes:     Conjunctiva/sclera: Conjunctivae normal.  Cardiovascular:     Rate and Rhythm: Normal rate and regular rhythm.     Heart sounds: No murmur heard. Pulmonary:     Effort: Pulmonary effort is normal. No respiratory distress.     Breath sounds: Normal breath sounds.  Abdominal:     General: Bowel sounds are normal.     Palpations: Abdomen is soft.     Tenderness: There is no abdominal tenderness.     Comments: Diffuse abdominal tenderness with no rebound guarding or rigidity  Musculoskeletal:        General: No  swelling.     Cervical back: Neck supple.  Skin:    General: Skin is warm and dry.     Capillary Refill: Capillary refill takes less than 2 seconds.  Neurological:     General: No focal deficit present.     Mental Status: She is alert and oriented to person, place, and time.  Psychiatric:        Mood and Affect: Mood normal.     ED Results / Procedures / Treatments   Labs (all labs ordered are listed, but only abnormal results are displayed) Labs Reviewed  LIPASE, BLOOD - Abnormal; Notable for the following components:      Result Value   Lipase 65 (*)    All other components within normal limits  COMPREHENSIVE METABOLIC PANEL - Abnormal; Notable for the following components:   Chloride 95 (*)    BUN 42 (*)    Creatinine, Ser  10.71 (*)    Calcium 8.8 (*)    Albumin 3.3 (*)    GFR, Estimated 3 (*)    Anion gap 18 (*)    All other components within normal limits  CBC - Abnormal; Notable for the following components:   RDW 18.6 (*)    All other components within normal limits  URINALYSIS, ROUTINE W REFLEX MICROSCOPIC    EKG None  Radiology CT ABDOMEN PELVIS WO CONTRAST  Result Date: 05/20/2023 CLINICAL DATA:  Abdominal pain and diarrhea for 2 weeks, worse with eating. Dialysis patient, skipped today's appointment because she did not feel well. EXAM: CT ABDOMEN AND PELVIS WITHOUT CONTRAST TECHNIQUE: Multidetector CT imaging of the abdomen and pelvis was performed following the standard protocol without IV contrast. RADIATION DOSE REDUCTION: This exam was performed according to the departmental dose-optimization program which includes automated exposure control, adjustment of the mA and/or kV according to patient size and/or use of iterative reconstruction technique. COMPARISON:  Multiple prior CTs back to 2006. The 2 most recent are CT without contrast 05/16/2023, CT with contrast 04/24/2023 FINDINGS: Lower chest: Bochdalek's fat herniation again noted through the posterior left hemidiaphragm into the lower chest. There is increased patchy haziness in the posterior left lower lobe which could be asymmetric atelectasis or a small pneumonia. Remaining lung bases are clear. There is mild cardiomegaly, minimal anterior pericardial effusion, calcification in the right coronary artery. Hepatobiliary: No focal liver abnormality is seen. No gallstones, gallbladder wall thickening, or biliary dilatation. Pancreas: Unremarkable without contrast. Spleen: Unremarkable without contrast.  No splenomegaly. Adrenals/Urinary Tract: Bilateral adrenal adenomatous hyperplasia. Chronically atrophic kidneys consistent with ESRD. 1.3 cm Bosniak 1 cyst again noted anterior right kidney, Hounsfield density is 11.2. No follow-up imaging  recommended. There is no mass enhancement in either kidney, no urinary stone or obstruction. The bladder appears thickened but is also contracted and not optimally seen. Correlate clinically for potential cystitis. Stomach/Bowel: Contracted stomach with chronic thickened folds. Endoscopy clips again noted in the duodenum and colon. There are thickened folds in the left abdominal small bowel but no small bowel dilatation or inflammatory change. The appendix is not as well seen as on the prior studies but the visualized portions are normal. There is no wall thickening or dilatation of the colon as well as can be seen without enteric contrast. There is sigmoid diverticulosis without diverticulitis. Vascular/Lymphatic: Extensive aortoiliac calcific plaque without AAA. Less heavy visceral branch vessel atherosclerosis. No lymphadenopathy is seen. Reproductive: Status post hysterectomy. No adnexal masses. Other: There is mild body wall anasarca and mesenteric congestion. Trace  presacral pelvic ascites. No free hemorrhage, free air or incarcerated hernia. No localizing collections or focal inflammatory process. Probable phleboliths. Musculoskeletal: No acute or significant osseous findings. IMPRESSION: 1. Increased patchy haziness in the posterior left lower lobe which could be asymmetric atelectasis or a small pneumonia. 2. Cardiomegaly with minimal anterior pericardial effusion. 3. Aortic and coronary artery atherosclerosis. 4. Chronically atrophic kidneys of ESRD. 5. Gastroenteritis. 6. Trace presacral pelvic ascites. 7. Body wall anasarca with mild mesenteric congestion. 8. Sigmoid diverticulosis without evidence of diverticulitis. 9. Bilateral adrenal adenomatous hyperplasia. Aortic Atherosclerosis (ICD10-I70.0). Electronically Signed   By: Almira Bar M.D.   On: 05/20/2023 22:18    Procedures Procedures    Medications Ordered in ED Medications  pantoprazole (PROTONIX) injection 40 mg (40 mg Intravenous  Given 05/20/23 2125)  ondansetron (ZOFRAN) injection 4 mg (4 mg Intravenous Given 05/20/23 2123)  fentaNYL (SUBLIMAZE) injection 25 mcg (25 mcg Intravenous Given 05/20/23 2124)  alum & mag hydroxide-simeth (MAALOX/MYLANTA) 200-200-20 MG/5ML suspension 30 mL (30 mLs Oral Given 05/20/23 2237)  fentaNYL (SUBLIMAZE) injection 50 mcg (50 mcg Intravenous Given 05/20/23 2236)    ED Course/ Medical Decision Making/ A&P                                 Medical Decision Making This patient presents to the ED for concern of abdominal pain x 2 months, this involves an extensive number of treatment options, and is a complaint that carries with it a high risk of complications and morbidity.  The differential diagnosis includes gastritis, gastroenteritis, appendicitis, cholecystitis, diverticulitis, DKA, nephrolithiasis, gastroparesis, other    Co morbidities that complicate the patient evaluation :   ESRD on dialysis Monday Wednesday Friday   Additional history obtained:  Additional history obtained from EMR External records from outside source obtained and reviewed including prior notes   Lab Tests:  I Ordered, and personally interpreted labs.  The pertinent results include: EMR, prior notes and labs   Imaging Studies ordered:  I ordered imaging studies including CT abdomen pelvis which shows no acute findings I independently visualized and interpreted imaging within scope of identifying emergent findings  I agree with the radiologist interpretation     Consultations Obtained:  I requested consultation with the hospitalist Dr. Arlean Hopping,  and discussed lab and imaging findings as well as pertinent plan - they recommend: Admission   Problem List / ED Course / Critical interventions / Medication management  Intractable abdominal pain and nausea-patient not eating for the past couple days, missed dialysis today due to feeling so poorly has had 2 visits prior to this in the ED over the past  month with benign workups and symptoms have not gotten any better, no fevers or chills but patient still having tenderness feeling unwell and feels she has to move for further evaluation workup and symptom control.  Patient is agreeable with this. I ordered medication including fentanyl and Maalox for pain Reevaluation of the patient after these medicines showed that the patient only improved, still having nausea after Zofran I have reviewed the patients home medicines and have made adjustments as needed       Amount and/or Complexity of Data Reviewed Labs: ordered. Radiology: ordered.  Risk OTC drugs. Prescription drug management.           Final Clinical Impression(s) / ED Diagnoses Final diagnoses:  Generalized abdominal pain    Rx / DC Orders ED Discharge Orders  None         Josem Kaufmann 05/20/23 2354    Laurence Spates, MD 05/21/23 [phone]

## 2023-05-20 NOTE — ED Triage Notes (Addendum)
PER EMS: pt is from home with c/o generalized abdominal pain x 2 weeks associated with diarrhea. Pain worse after eating. Dialysis MWF, but did not go today because she did not feel well.   BP- 120/70, HR-72, RR-18, 97% RA, CBG-95

## 2023-05-21 ENCOUNTER — Inpatient Hospital Stay (HOSPITAL_COMMUNITY): Payer: 59

## 2023-05-21 DIAGNOSIS — R1010 Upper abdominal pain, unspecified: Secondary | ICD-10-CM | POA: Diagnosis not present

## 2023-05-21 LAB — COMPREHENSIVE METABOLIC PANEL
ALT: 8 U/L (ref 0–44)
AST: 11 U/L — ABNORMAL LOW (ref 15–41)
Albumin: 3 g/dL — ABNORMAL LOW (ref 3.5–5.0)
Alkaline Phosphatase: 52 U/L (ref 38–126)
Anion gap: 16 — ABNORMAL HIGH (ref 5–15)
BUN: 45 mg/dL — ABNORMAL HIGH (ref 8–23)
CO2: 23 mmol/L (ref 22–32)
Calcium: 8.4 mg/dL — ABNORMAL LOW (ref 8.9–10.3)
Chloride: 100 mmol/L (ref 98–111)
Creatinine, Ser: 11.19 mg/dL — ABNORMAL HIGH (ref 0.44–1.00)
GFR, Estimated: 3 mL/min — ABNORMAL LOW (ref >60)
Glucose, Bld: 72 mg/dL (ref 70–99)
Potassium: 5.1 mmol/L (ref 3.5–5.1)
Sodium: 139 mmol/L (ref 135–145)
Total Bilirubin: 0.8 mg/dL (ref <1.2)
Total Protein: 6.4 g/dL — ABNORMAL LOW (ref 6.5–8.1)

## 2023-05-21 LAB — CBC WITH DIFFERENTIAL/PLATELET
Abs Immature Granulocytes: 0.03 10*3/uL (ref 0.00–0.07)
Basophils Absolute: 0.1 10*3/uL (ref 0.0–0.1)
Basophils Relative: 1 %
Eosinophils Absolute: 0.2 10*3/uL (ref 0.0–0.5)
Eosinophils Relative: 4 %
HCT: 36.5 % (ref 36.0–46.0)
Hemoglobin: 11.3 g/dL — ABNORMAL LOW (ref 12.0–15.0)
Immature Granulocytes: 1 %
Lymphocytes Relative: 21 %
Lymphs Abs: 1.1 10*3/uL (ref 0.7–4.0)
MCH: 27.1 pg (ref 26.0–34.0)
MCHC: 31 g/dL (ref 30.0–36.0)
MCV: 87.5 fL (ref 80.0–100.0)
Monocytes Absolute: 0.7 10*3/uL (ref 0.1–1.0)
Monocytes Relative: 14 %
Neutro Abs: 3.2 10*3/uL (ref 1.7–7.7)
Neutrophils Relative %: 59 %
Platelets: 290 10*3/uL (ref 150–400)
RBC: 4.17 MIL/uL (ref 3.87–5.11)
RDW: 18.4 % — ABNORMAL HIGH (ref 11.5–15.5)
WBC: 5.3 10*3/uL (ref 4.0–10.5)
nRBC: 0 % (ref 0.0–0.2)

## 2023-05-21 LAB — RENAL FUNCTION PANEL
Albumin: 3.3 g/dL — ABNORMAL LOW (ref 3.5–5.0)
Anion gap: 21 — ABNORMAL HIGH (ref 5–15)
BUN: 50 mg/dL — ABNORMAL HIGH (ref 8–23)
CO2: 21 mmol/L — ABNORMAL LOW (ref 22–32)
Calcium: 8.6 mg/dL — ABNORMAL LOW (ref 8.9–10.3)
Chloride: 95 mmol/L — ABNORMAL LOW (ref 98–111)
Creatinine, Ser: 11.87 mg/dL — ABNORMAL HIGH (ref 0.44–1.00)
GFR, Estimated: 3 mL/min — ABNORMAL LOW (ref >60)
Glucose, Bld: 74 mg/dL (ref 70–99)
Phosphorus: 7.3 mg/dL — ABNORMAL HIGH (ref 2.5–4.6)
Potassium: 5.5 mmol/L — ABNORMAL HIGH (ref 3.5–5.1)
Sodium: 137 mmol/L (ref 135–145)

## 2023-05-21 LAB — PHOSPHORUS: Phosphorus: 6 mg/dL — ABNORMAL HIGH (ref 2.5–4.6)

## 2023-05-21 LAB — LIPASE, BLOOD: Lipase: 53 U/L — ABNORMAL HIGH (ref 11–51)

## 2023-05-21 LAB — MAGNESIUM: Magnesium: 1.9 mg/dL (ref 1.7–2.4)

## 2023-05-21 LAB — HEPATITIS B SURFACE ANTIGEN: Hepatitis B Surface Ag: NONREACTIVE

## 2023-05-21 MED ORDER — PANCRELIPASE (LIP-PROT-AMYL) 36000-114000 UNITS PO CPEP
48000.0000 [IU] | ORAL_CAPSULE | Freq: Two times a day (BID) | ORAL | Status: DC
  Administered 2023-05-21 – 2023-05-28 (×13): 48000 [IU] via ORAL
  Filled 2023-05-21 (×14): qty 1

## 2023-05-21 MED ORDER — NEPRO/CARBSTEADY PO LIQD: 237.0000 mL | ORAL | Status: DC | PRN

## 2023-05-21 MED ORDER — PANTOPRAZOLE SODIUM 40 MG IV SOLR
40.0000 mg | Freq: Two times a day (BID) | INTRAVENOUS | Status: DC
  Administered 2023-05-21 – 2023-05-23 (×4): 40 mg via INTRAVENOUS
  Filled 2023-05-21 (×4): qty 10

## 2023-05-21 MED ORDER — HEPARIN SODIUM (PORCINE) 1000 UNIT/ML DIALYSIS: 1000.0000 [IU] | INTRAMUSCULAR | Status: DC | PRN

## 2023-05-21 MED ORDER — MELATONIN 3 MG PO TABS: 3.0000 mg | ORAL_TABLET | Freq: Every evening | ORAL | Status: DC | PRN

## 2023-05-21 MED ORDER — CHLORHEXIDINE GLUCONATE CLOTH 2 % EX PADS
6.0000 | MEDICATED_PAD | Freq: Every day | CUTANEOUS | Status: DC
  Administered 2023-05-22 – 2023-05-26 (×5): 6 via TOPICAL

## 2023-05-21 MED ORDER — SODIUM CHLORIDE 0.9 % IV SOLN
2.0000 g | INTRAVENOUS | Status: DC
Start: 2023-05-21 — End: 2023-05-26
  Administered 2023-05-21 – 2023-05-25 (×5): 2 g via INTRAVENOUS
  Filled 2023-05-21 (×6): qty 20

## 2023-05-21 MED ORDER — AMLODIPINE BESYLATE 10 MG PO TABS
10.0000 mg | ORAL_TABLET | Freq: Every day | ORAL | Status: DC
Start: 2023-05-21 — End: 2023-05-28
  Administered 2023-05-21 – 2023-05-28 (×8): 10 mg via ORAL
  Filled 2023-05-21 (×4): qty 1
  Filled 2023-05-21: qty 2
  Filled 2023-05-21 (×3): qty 1

## 2023-05-21 MED ORDER — LIDOCAINE HCL (PF) 1 % IJ SOLN: 5.0000 mL | INTRAMUSCULAR | Status: DC | PRN

## 2023-05-21 MED ORDER — PENTAFLUOROPROP-TETRAFLUOROETH EX AERO
1.0000 | INHALATION_SPRAY | CUTANEOUS | Status: DC | PRN
Start: 2023-05-21 — End: 2023-05-22

## 2023-05-21 MED ORDER — ALTEPLASE 2 MG IJ SOLR: 2.0000 mg | Freq: Once | INTRAMUSCULAR | Status: DC | PRN

## 2023-05-21 MED ORDER — METRONIDAZOLE 500 MG/100ML IV SOLN
500.0000 mg | Freq: Two times a day (BID) | INTRAVENOUS | Status: DC
Start: 2023-05-21 — End: 2023-05-26
  Administered 2023-05-21 – 2023-05-26 (×10): 500 mg via INTRAVENOUS
  Filled 2023-05-21 (×10): qty 100

## 2023-05-21 MED ORDER — SEVELAMER CARBONATE 800 MG PO TABS
800.0000 mg | ORAL_TABLET | Freq: Three times a day (TID) | ORAL | Status: DC
  Administered 2023-05-21 – 2023-05-28 (×21): 800 mg via ORAL
  Filled 2023-05-21 (×21): qty 1

## 2023-05-21 MED ORDER — ANTICOAGULANT SODIUM CITRATE 4% (200MG/5ML) IV SOLN: 5.0000 mL | Status: DC | PRN

## 2023-05-21 MED ORDER — ACETAMINOPHEN 325 MG PO TABS
650.0000 mg | ORAL_TABLET | Freq: Four times a day (QID) | ORAL | Status: DC | PRN
  Administered 2023-05-23: 650 mg via ORAL
  Filled 2023-05-21: qty 2

## 2023-05-21 MED ORDER — SUCRALFATE 1 G PO TABS
1.0000 g | ORAL_TABLET | Freq: Three times a day (TID) | ORAL | Status: DC
  Administered 2023-05-21: 1 g via ORAL
  Filled 2023-05-21: qty 1

## 2023-05-21 MED ORDER — ONDANSETRON HCL 4 MG/2ML IJ SOLN: 4.0000 mg | Freq: Four times a day (QID) | INTRAMUSCULAR | Status: DC | PRN

## 2023-05-21 MED ORDER — LIDOCAINE-PRILOCAINE 2.5-2.5 % EX CREA: 1.0000 | TOPICAL_CREAM | CUTANEOUS | Status: DC | PRN

## 2023-05-21 MED ORDER — NALOXONE HCL 0.4 MG/ML IJ SOLN: 0.4000 mg | INTRAMUSCULAR | Status: DC | PRN

## 2023-05-21 MED ORDER — CHLORHEXIDINE GLUCONATE CLOTH 2 % EX PADS
6.0000 | MEDICATED_PAD | Freq: Every day | CUTANEOUS | Status: DC
  Administered 2023-05-22 – 2023-05-28 (×7): 6 via TOPICAL

## 2023-05-21 MED ORDER — HEPARIN SODIUM (PORCINE) 5000 UNIT/ML IJ SOLN
5000.0000 [IU] | Freq: Three times a day (TID) | INTRAMUSCULAR | Status: DC
  Administered 2023-05-21 – 2023-05-28 (×20): 5000 [IU] via SUBCUTANEOUS
  Filled 2023-05-21 (×20): qty 1

## 2023-05-21 MED ORDER — PENTAFLUOROPROP-TETRAFLUOROETH EX AERO: 1.0000 | INHALATION_SPRAY | CUTANEOUS | Status: DC | PRN

## 2023-05-21 MED ORDER — HYDROMORPHONE HCL 1 MG/ML IJ SOLN
0.5000 mg | INTRAMUSCULAR | Status: DC | PRN
  Administered 2023-05-21 – 2023-05-25 (×28): 0.5 mg via INTRAVENOUS
  Filled 2023-05-21 (×19): qty 0.5
  Filled 2023-05-21: qty 1
  Filled 2023-05-21 (×9): qty 0.5

## 2023-05-21 MED ORDER — ACETAMINOPHEN 650 MG RE SUPP: 650.0000 mg | Freq: Four times a day (QID) | RECTAL | Status: DC | PRN

## 2023-05-21 MED ORDER — HYDRALAZINE HCL 50 MG PO TABS
75.0000 mg | ORAL_TABLET | Freq: Three times a day (TID) | ORAL | Status: DC
  Administered 2023-05-21 – 2023-05-28 (×20): 75 mg via ORAL
  Filled 2023-05-21 (×16): qty 1
  Filled 2023-05-21: qty 3
  Filled 2023-05-21 (×4): qty 1

## 2023-05-21 MED ORDER — SODIUM CHLORIDE 0.9 % IV SOLN: 12.5000 mg | Freq: Four times a day (QID) | INTRAVENOUS | Status: DC | PRN

## 2023-05-21 MED ORDER — GABAPENTIN 100 MG PO CAPS
200.0000 mg | ORAL_CAPSULE | Freq: Every day | ORAL | Status: DC
Start: 2023-05-21 — End: 2023-05-28
  Administered 2023-05-21 – 2023-05-27 (×7): 200 mg via ORAL
  Filled 2023-05-21 (×7): qty 2

## 2023-05-21 NOTE — Plan of Care (Signed)

## 2023-05-21 NOTE — ED Notes (Signed)
ED TO INPATIENT HANDOFF REPORT  ED Nurse Name and Phone #: Lenell Antu Name/Age/Gender Ashley [name] 72 y.o. female Room/Bed: 001C/001C  Code Status   Code Status: Full Code  Home/SNF/Other Home Patient oriented to: self, place, time, and situation Is this baseline? Yes   Triage Complete: Triage complete  Chief Complaint Abdominal pain [R10.9]  Triage Note PER EMS: pt is from home with c/o generalized abdominal pain x 2 weeks associated with diarrhea. Pain worse after eating. Dialysis MWF, but did not go today because she did not feel well.   BP- 120/70, HR-72, RR-18, 97% RA, CBG-95   Allergies Allergies  Allergen Reactions   Aspirin Nausea And Vomiting    Stomach ache   Ibuprofen Nausea And Vomiting    Stomach ache    Level of Care/Admitting Diagnosis ED Disposition     ED Disposition  Admit   Condition  --   Comment  Hospital Area: [facility] [[id number]]  Level of Care: Progressive [102]  Admit to Progressive based on following criteria: MULTISYSTEM THREATS such as stable sepsis, metabolic/electrolyte imbalance with or without encephalopathy that is responding to early treatment.  May admit patient to [facility] or [facility] if equivalent level of care is available:: No  Covid Evaluation: Asymptomatic - no recent exposure (last 10 days) testing not required  Diagnosis: Abdominal pain [253664]  Admitting Physician: Angie Fava [[id number]]  Attending Physician: Angie Fava [[id number]]  Certification:: I certify this patient will need inpatient services for at least 2 midnights  Expected Medical Readiness: 05/22/2023          B Medical/Surgery History Past Medical History:  Diagnosis Date   Acute pancreatitis 2000   2000, 12/2018, 08/2019   Arthritis    Cervical radiculopathy 02/28/2011   Cocaine substance abuse (HCC) 05/26/2013   positive UDS    Duodenitis    Erosive gastropathy    ESRD on hemodialysis (HCC)    TTS   GERD  (gastroesophageal reflux disease)    Hepatitis C 1987   dt hx IVDA.  genotype 2B.  Epclusa started early 04/2020.     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  Chronic microvascular ischemic changes w parenchymal volume loss. Chronic white matter periventricular microhemorrhage, likely due to htn   Ulcer 1990   gastric ulcer. Ruptured s/p emergency repair   Past Surgical History:  Procedure Laterality Date   ABDOMINAL HYSTERECTOMY  1979   AV FISTULA PLACEMENT Left 06/16/2017   Procedure: ARTERIOVENOUS (AV) FISTULA CREATION LEFT ARM;  Surgeon: Fransisco Hertz, MD;  Location: [facility] OR;  Service: Vascular;  Laterality: Left;   BASCILIC VEIN TRANSPOSITION Left 10/02/2017   Procedure: BASILIC VEIN TRANSPOSITION SECOND STAGE LEFT ARM;  Surgeon: Larina Earthly, MD;  Location: [facility] OR;  Service: Vascular;  Laterality: Left;   BIOPSY  09/06/2019   Procedure: BIOPSY;  Surgeon: Iva Boop, MD;  Location: [facility] ENDOSCOPY;  Service: Endoscopy;;   BIOPSY  03/13/2023   Procedure: BIOPSY;  Surgeon: Shellia Cleverly, DO;  Location: [facility] ENDOSCOPY;  Service: Gastroenterology;;   COLONOSCOPY WITH PROPOFOL N/A 03/13/2023   Procedure: COLONOSCOPY WITH PROPOFOL;  Surgeon: Shellia Cleverly, DO;  Location: [facility]  ENDOSCOPY;  Service: Gastroenterology;  Laterality: N/A;   ENTEROSCOPY N/A 10/22/2022   Procedure: ENTEROSCOPY;  Surgeon: Sherrilyn Rist, MD;  Location: [facility] ENDOSCOPY;  Service: Gastroenterology;  Laterality: N/A;   ESOPHAGOGASTRODUODENOSCOPY N/A 05/29/2013   Procedure: ESOPHAGOGASTRODUODENOSCOPY (EGD);  Surgeon: Beverley Fiedler, MD;  Location: [facility] ENDOSCOPY;  Service: Endoscopy;  Laterality: N/A;   ESOPHAGOGASTRODUODENOSCOPY  05/2013   for epigastric pain.   Nonobstructing Schatzki ring at GEJ, mild gastropathy, nonbleeding AVMs in bulb and D2. 5 mm sessile polyp in bulb.   ESOPHAGOGASTRODUODENOSCOPY (EGD) WITH PROPOFOL N/A 09/06/2019   Procedure: ESOPHAGOGASTRODUODENOSCOPY (EGD) WITH PROPOFOL;  Surgeon: Iva Boop, MD;  Location: [facility] ENDOSCOPY;  Service: Endoscopy;  Laterality: N/A;   ESOPHAGOGASTRODUODENOSCOPY (EGD) WITH PROPOFOL N/A 02/02/2020   Procedure: ESOPHAGOGASTRODUODENOSCOPY (EGD) WITH PROPOFOL;  Surgeon: Rachael Fee, MD;  Location: [facility] ENDOSCOPY;  Service: Endoscopy;  Laterality: N/A;   ESOPHAGOGASTRODUODENOSCOPY (EGD) WITH PROPOFOL N/A 02/07/2021   Procedure: ESOPHAGOGASTRODUODENOSCOPY (EGD) WITH PROPOFOL;  Surgeon: Rachael Fee, MD;  Location: [facility] ENDOSCOPY;  Service: Endoscopy;  Laterality: N/A;   ESOPHAGOGASTRODUODENOSCOPY (EGD) WITH PROPOFOL N/A 03/13/2023   Procedure: ESOPHAGOGASTRODUODENOSCOPY (EGD) WITH PROPOFOL;  Surgeon: Shellia Cleverly, DO;  Location: [facility] ENDOSCOPY;  Service: Gastroenterology;  Laterality: N/A;   EUS N/A 02/02/2020   Procedure: UPPER ENDOSCOPIC ULTRASOUND (EUS) RADIAL;  Surgeon: Rachael Fee, MD;  Location: [facility] ENDOSCOPY;  Service: Endoscopy;  Laterality: N/A;   EXCHANGE OF A DIALYSIS CATHETER Left 07/31/2017   Procedure: Removal  OF A  Right GroinTUNNELED  DIALYSIS CATHETER ,  Insertion of Left Femoral Dialysis Catheter.;  Surgeon: Larina Earthly, MD;  Location: [facility] OR;  Service: Vascular;  Laterality: Left;   HEMOSTASIS CLIP PLACEMENT  02/07/2021   Procedure: HEMOSTASIS CLIP PLACEMENT;  Surgeon: Rachael Fee, MD;  Location: [facility] ENDOSCOPY;  Service: Endoscopy;;   HEMOSTASIS CLIP PLACEMENT  10/22/2022   Procedure: HEMOSTASIS CLIP PLACEMENT;  Surgeon: Sherrilyn Rist, MD;  Location: [facility] ENDOSCOPY;  Service: Gastroenterology;;   HEMOSTASIS CLIP PLACEMENT  03/13/2023   Procedure: HEMOSTASIS CLIP PLACEMENT;  Surgeon: Shellia Cleverly, DO;  Location: [facility] ENDOSCOPY;  Service: Gastroenterology;;   HOT  HEMOSTASIS N/A 09/06/2019   Procedure: HOT HEMOSTASIS (ARGON PLASMA COAGULATION/BICAP);  Surgeon: Iva Boop, MD;  Location: [facility] ENDOSCOPY;  Service: Endoscopy;  Laterality: N/A;   HOT HEMOSTASIS N/A 02/07/2021   Procedure: HOT HEMOSTASIS (ARGON PLASMA COAGULATION/BICAP);  Surgeon: Rachael Fee, MD;  Location: [facility] ENDOSCOPY;  Service: Endoscopy;  Laterality: N/A;   HOT HEMOSTASIS N/A 10/22/2022   Procedure: HOT HEMOSTASIS (ARGON PLASMA COAGULATION/BICAP);  Surgeon: Sherrilyn Rist, MD;  Location: [facility] ENDOSCOPY;  Service: Gastroenterology;  Laterality: N/A;   INSERTION OF DIALYSIS CATHETER Right 06/16/2017   Procedure: INSERTION OF DIALYSIS CATHETER;  Surgeon: Fransisco Hertz, MD;  Location: [facility] OR;  Service: Vascular;  Laterality: Right;   IR AV DIALY SHUNT INTRO NEEDLE/INTRACATH INITIAL W/PTA/IMG LEFT  06/21/2018   POLYPECTOMY  03/13/2023   Procedure: POLYPECTOMY;  Surgeon: Shellia Cleverly, DO;  Location: [facility] ENDOSCOPY;  Service: Gastroenterology;;   REPAIR OF PERFORATED ULCER  1990   gastric ulcer   SCLEROTHERAPY  10/22/2022   Procedure: SCLEROTHERAPY;  Surgeon: Sherrilyn Rist, MD;  Location: [facility] ENDOSCOPY;  Service: Gastroenterology;;   SUBMUCOSAL TATTOO INJECTION  10/22/2022   Procedure: SUBMUCOSAL TATTOO INJECTION;  Surgeon: Sherrilyn Rist, MD;  Location: [facility] ENDOSCOPY;  Service: Gastroenterology;;     A IV Location/Drains/Wounds Patient Lines/Drains/Airways Status  Active Line/Drains/Airways     Name Placement date Placement time Site Days   Peripheral IV 05/20/23 20 G Anterior;Right Forearm 05/20/23  2005  Forearm  1   Fistula / Graft Left Upper arm Arteriovenous fistula --  --  Upper arm  --            Intake/Output Last 24 hours No intake or output data in the 24 hours ending 05/21/23 1415  Labs/Imaging Results for orders placed or performed during the hospital encounter of 05/20/23 (from the past 48 hours)  Lipase, blood     Status: Abnormal   Collection  Time: 05/20/23  8:02 PM  Result Value Ref Range   Lipase 65 (H) 11 - 51 U/L    Comment: Performed at [facility], [address] [place], [place] [address]  Comprehensive metabolic panel     Status: Abnormal   Collection Time: 05/20/23  8:02 PM  Result Value Ref Range   Sodium 137 135 - 145 mmol/L   Potassium 4.9 3.5 - 5.1 mmol/L   Chloride 95 (L) 98 - 111 mmol/L   CO2 24 22 - 32 mmol/L   Glucose, Bld 86 70 - 99 mg/dL    Comment: Glucose reference range applies only to samples taken after fasting for at least 8 hours.   BUN 42 (H) 8 - 23 mg/dL   Creatinine, Ser 60.45 (H) 0.44 - 1.00 mg/dL   Calcium 8.8 (L) 8.9 - 10.3 mg/dL   Total Protein 7.0 6.5 - 8.1 g/dL   Albumin 3.3 (L) 3.5 - 5.0 g/dL   AST 17 15 - 41 U/L   ALT 7 0 - 44 U/L   Alkaline Phosphatase 56 38 - 126 U/L   Total Bilirubin 0.7 <1.2 mg/dL   GFR, Estimated 3 (L) >60 mL/min    Comment: (NOTE) Calculated using the CKD-EPI Creatinine Equation (2021)    Anion gap 18 (H) 5 - 15    Comment: Performed at [facility], [address] [place], [place] [address]  CBC     Status: Abnormal   Collection Time: 05/20/23  8:02 PM  Result Value Ref Range   WBC 5.8 4.0 - 10.5 K/uL   RBC 4.57 3.87 - 5.11 MIL/uL   Hemoglobin 12.1 12.0 - 15.0 g/dL   HCT 19.1 47.8 - 29.5 %   MCV 85.8 80.0 - 100.0 fL   MCH 26.5 26.0 - 34.0 pg   MCHC 30.9 30.0 - 36.0 g/dL   RDW 62.1 (H) 30.8 - 65.7 %   Platelets 314 150 - 400 K/uL   nRBC 0.0 0.0 - 0.2 %    Comment: Performed at [facility], [address] [place], [place] [address]  CBC with Differential/Platelet     Status: Abnormal   Collection Time: 05/21/23  5:25 AM  Result Value Ref Range   WBC 5.3 4.0 - 10.5 K/uL   RBC 4.17 3.87 - 5.11 MIL/uL   Hemoglobin 11.3 (L) 12.0 - 15.0 g/dL   HCT 29.5 28.4 - 13.2 %   MCV 87.5 80.0 - 100.0 fL   MCH 27.1 26.0 - 34.0 pg   MCHC 31.0 30.0 - 36.0 g/dL   RDW 44.0 (H) 10.2 - 72.5 %   Platelets 290 150 - 400 K/uL   nRBC 0.0 0.0 - 0.2  %   Neutrophils Relative % 59 %   Neutro Abs 3.2 1.7 - 7.7 K/uL   Lymphocytes Relative 21 %  Lymphs Abs 1.1 0.7 - 4.0 K/uL   Monocytes Relative 14 %   Monocytes Absolute 0.7 0.1 - 1.0 K/uL   Eosinophils Relative 4 %   Eosinophils Absolute 0.2 0.0 - 0.5 K/uL   Basophils Relative 1 %   Basophils Absolute 0.1 0.0 - 0.1 K/uL   Immature Granulocytes 1 %   Abs Immature Granulocytes 0.03 0.00 - 0.07 K/uL    Comment: Performed at [facility], [address], [place], [place] [address]  Comprehensive metabolic panel     Status: Abnormal   Collection Time: 05/21/23  5:25 AM  Result Value Ref Range   Sodium 139 135 - 145 mmol/L   Potassium 5.1 3.5 - 5.1 mmol/L   Chloride 100 98 - 111 mmol/L   CO2 23 22 - 32 mmol/L   Glucose, Bld 72 70 - 99 mg/dL    Comment: Glucose reference range applies only to samples taken after fasting for at least 8 hours.   BUN 45 (H) 8 - 23 mg/dL   Creatinine, Ser 60.45 (H) 0.44 - 1.00 mg/dL   Calcium 8.4 (L) 8.9 - 10.3 mg/dL   Total Protein 6.4 (L) 6.5 - 8.1 g/dL   Albumin 3.0 (L) 3.5 - 5.0 g/dL   AST 11 (L) 15 - 41 U/L   ALT 8 0 - 44 U/L   Alkaline Phosphatase 52 38 - 126 U/L   Total Bilirubin 0.8 <1.2 mg/dL   GFR, Estimated 3 (L) >60 mL/min    Comment: (NOTE) Calculated using the CKD-EPI Creatinine Equation (2021)    Anion gap 16 (H) 5 - 15    Comment: Performed at [facility], [address], [place], [place] [address]  Magnesium     Status: None   Collection Time: 05/21/23  5:25 AM  Result Value Ref Range   Magnesium 1.9 1.7 - 2.4 mg/dL    Comment: Performed at [facility], [address], [place], [place] [address]  Phosphorus     Status: Abnormal   Collection Time: 05/21/23  5:25 AM  Result Value Ref Range   Phosphorus 6.0 (H) 2.5 - 4.6 mg/dL    Comment: Performed at [facility], [address] [place], [place] [address]  Lipase, blood     Status: Abnormal   Collection Time: 05/21/23  5:25 AM  Result Value Ref Range    Lipase 53 (H) 11 - 51 U/L    Comment: Performed at [facility], [address], [place], [place] [address]   DG Chest 2 View Result Date: 05/21/2023 CLINICAL DATA:  Generalized abdominal pain for 2 weeks with diarrhea. EXAM: CHEST - 2 VIEW COMPARISON:  05/15/2023. FINDINGS: Mild pulmonary vascular congestion, similar to the prior study. No frank pulmonary edema. Bilateral lung fields are otherwise clear. No acute consolidation or lung collapse. Bilateral costophrenic angles are clear. Stable cardio-mediastinal silhouette. No acute osseous abnormalities. Left brachial/axillary vessel vascular stent again seen. The soft tissues are within normal limits. IMPRESSION: *No active cardiopulmonary disease. Persistent mild pulmonary vascular congestion without frank pulmonary edema. Electronically Signed   By: Jules Schick M.D.   On: 05/21/2023 09:12   CT ABDOMEN PELVIS WO CONTRAST Result Date: 05/20/2023 CLINICAL DATA:  Abdominal pain and diarrhea for 2 weeks, worse with eating. Dialysis patient, skipped today's appointment because she did not feel well. EXAM: CT ABDOMEN AND PELVIS WITHOUT CONTRAST TECHNIQUE: Multidetector CT imaging of the abdomen and pelvis was performed following the standard  protocol without IV contrast. RADIATION DOSE REDUCTION: This exam was performed according to the departmental dose-optimization program which includes automated exposure control, adjustment of the mA and/or kV according to patient size and/or use of iterative reconstruction technique. COMPARISON:  Multiple prior CTs back to 2006. The 2 most recent are CT without contrast 05/16/2023, CT with contrast 04/24/2023 FINDINGS: Lower chest: Bochdalek's fat herniation again noted through the posterior left hemidiaphragm into the lower chest. There is increased patchy haziness in the posterior left lower lobe which could be asymmetric atelectasis or a small pneumonia. Remaining lung bases are clear. There is mild  cardiomegaly, minimal anterior pericardial effusion, calcification in the right coronary artery. Hepatobiliary: No focal liver abnormality is seen. No gallstones, gallbladder wall thickening, or biliary dilatation. Pancreas: Unremarkable without contrast. Spleen: Unremarkable without contrast.  No splenomegaly. Adrenals/Urinary Tract: Bilateral adrenal adenomatous hyperplasia. Chronically atrophic kidneys consistent with ESRD. 1.3 cm Bosniak 1 cyst again noted anterior right kidney, Hounsfield density is 11.2. No follow-up imaging recommended. There is no mass enhancement in either kidney, no urinary stone or obstruction. The bladder appears thickened but is also contracted and not optimally seen. Correlate clinically for potential cystitis. Stomach/Bowel: Contracted stomach with chronic thickened folds. Endoscopy clips again noted in the duodenum and colon. There are thickened folds in the left abdominal small bowel but no small bowel dilatation or inflammatory change. The appendix is not as well seen as on the prior studies but the visualized portions are normal. There is no wall thickening or dilatation of the colon as well as can be seen without enteric contrast. There is sigmoid diverticulosis without diverticulitis. Vascular/Lymphatic: Extensive aortoiliac calcific plaque without AAA. Less heavy visceral branch vessel atherosclerosis. No lymphadenopathy is seen. Reproductive: Status post hysterectomy. No adnexal masses. Other: There is mild body wall anasarca and mesenteric congestion. Trace presacral pelvic ascites. No free hemorrhage, free air or incarcerated hernia. No localizing collections or focal inflammatory process. Probable phleboliths. Musculoskeletal: No acute or significant osseous findings. IMPRESSION: 1. Increased patchy haziness in the posterior left lower lobe which could be asymmetric atelectasis or a small pneumonia. 2. Cardiomegaly with minimal anterior pericardial effusion. 3. Aortic and  coronary artery atherosclerosis. 4. Chronically atrophic kidneys of ESRD. 5. Gastroenteritis. 6. Trace presacral pelvic ascites. 7. Body wall anasarca with mild mesenteric congestion. 8. Sigmoid diverticulosis without evidence of diverticulitis. 9. Bilateral adrenal adenomatous hyperplasia. Aortic Atherosclerosis (ICD10-I70.0). Electronically Signed   By: Almira Bar M.D.   On: 05/20/2023 22:18    Pending Labs Unresulted Labs (From admission, onward)     Start     Ordered   05/21/23 1048  Hepatitis B surface antigen  (New Admission Hemo Labs (Hepatitis B))  Once,   R        05/21/23 1050   05/21/23 1048  Hepatitis B surface antibody,quantitative  (New Admission Hemo Labs (Hepatitis B))  Once,   R        05/21/23 1050   05/21/23 0911  Gastrointestinal Panel by PCR , Stool  (Gastrointestinal Panel by PCR, Stool                                                                                                                                                     **  Does Not include CLOSTRIDIUM DIFFICILE testing. **If CDIFF testing is needed, place order from the "C Difficile Testing" order set.**)  Once,   R        05/21/23 0910   05/21/23 0747  Urine Culture (for pregnant, neutropenic or urologic patients or patients with an indwelling urinary catheter)  (Urine Labs)  Once,   R       Question:  Indication  Answer:  Suprapubic pain   05/21/23 0746            Vitals/Pain Today's Vitals   05/21/23 0920 05/21/23 0958 05/21/23 1100 05/21/23 1200  BP: (!) 160/64   (!) 140/83  Pulse:   66 63  Resp:    12  Temp:  98.2 F (36.8 C)    TempSrc:  Oral    SpO2:   97% 97%  Weight:      Height:      PainSc:        Isolation Precautions Enteric precautions (UV disinfection)  Medications Medications  acetaminophen (TYLENOL) tablet 650 mg (has no administration in time range)    Or  acetaminophen (TYLENOL) suppository 650 mg (has no administration in time range)  melatonin tablet 3 mg (has no  administration in time range)  ondansetron (ZOFRAN) injection 4 mg (has no administration in time range)  naloxone (NARCAN) injection 0.4 mg (has no administration in time range)  HYDROmorphone (DILAUDID) injection 0.5 mg (0.5 mg Intravenous Given 05/21/23 0616)  promethazine (PHENERGAN) 12.5 mg in sodium chloride 0.9 % 50 mL IVPB (has no administration in time range)  pantoprazole (PROTONIX) injection 40 mg (40 mg Intravenous Given 05/21/23 0920)  amLODipine (NORVASC) tablet 10 mg (10 mg Oral Given 05/21/23 1219)  hydrALAZINE (APRESOLINE) tablet 75 mg (75 mg Oral Given 05/21/23 0920)  lipase/protease/amylase (CREON) capsule 48,000 Units (48,000 Units Oral Given 05/21/23 0921)  sevelamer carbonate (RENVELA) tablet 800 mg (800 mg Oral Given 05/21/23 1222)  gabapentin (NEURONTIN) capsule 200 mg (has no administration in time range)  heparin injection 5,000 Units (5,000 Units Subcutaneous Given 05/21/23 0824)  Chlorhexidine Gluconate Cloth 2 % PADS 6 each (has no administration in time range)  Chlorhexidine Gluconate Cloth 2 % PADS 6 each (has no administration in time range)  pantoprazole (PROTONIX) injection 40 mg (40 mg Intravenous Given 05/20/23 2125)  ondansetron (ZOFRAN) injection 4 mg (4 mg Intravenous Given 05/20/23 2123)  fentaNYL (SUBLIMAZE) injection 25 mcg (25 mcg Intravenous Given 05/20/23 2124)  alum & mag hydroxide-simeth (MAALOX/MYLANTA) 200-200-20 MG/5ML suspension 30 mL (30 mLs Oral Given 05/20/23 2237)  fentaNYL (SUBLIMAZE) injection 50 mcg (50 mcg Intravenous Given 05/20/23 2236)    Mobility walks with device     Focused Assessments Renal Assessment Handoff:  Hemodialysis Schedule: Hemodialysis Schedule: Monday/Wednesday/Friday Last Hemodialysis date and time:    Restricted appendage: left arm   R Recommendations: See Admitting Provider Note  Report given to:   Additional Notes:

## 2023-05-21 NOTE — Progress Notes (Signed)
Patient needs stool sample collected.  Patient arrived on unit at 1530 and has not had a bowel movement since been on the unit.  Patient did tell RN that she has not had a bowel movement since yesterday.

## 2023-05-21 NOTE — H&P (Addendum)
History and Physical    Patient: Ashley [name]:[id number] DOB: 01-02-1951 DOA: 05/20/2023 DOS: the patient was seen and examined on 05/21/2023 PCP: Raymon Mutton., FNP  Patient coming from: Home Medical readiness discharge disposition: Anticipate ready for discharge on 05/24/2023  Chief Complaint:  Chief Complaint  Patient presents with   Abdominal Pain   HPI: Ashley [name] is a 72 y.o. female with medical history significant of dyslipidemia, tobacco abuse, peptic ulcer disease with GI bleed, hepatitis C, hypertension, history of polysubstance abuse, prior pancreatitis, history of CVA.  Presented via EMS from home after complaining of generalized abdominal pain for 2 weeks associated with diarrhea worse after eating.  Typical dialysis days are MWF but missed this past Wednesday/date of admission due to pain.  Vital signs were stable in the ER and she was not hypoxic and CBG was 95.  Labs were stable with potassium of 5.1.  Creatinine elevated at 11.19 consistent with missed dialysis, lipase was elevated at 65.  White count normal.  CT abdomen and pelvis demonstrated patchy haziness in the posterior left lower lobe of the lung that could be asymmetric atelectasis versus small pneumonia.  There was also evidence of acute gastroenteritis.  There was body wall anasarca with mild mesenteric congestion.  O2 sats dipped into the 93 range and patient has been placed on nasal cannula oxygen.  Hospital service has been asked to evaluate the patient for admission.  Review of Systems: Patient reports dry heaves without emesis.  Diffuse abdominal pain worse in the epigastrium.  A few episodes of diarrhea not high-volume and no blood or black stool.  She reports her great grandchildren have been sick with similar symptoms.  She also reports cough without shortness of breath with productive white phlegm.   Past Medical History:  Diagnosis Date   Acute pancreatitis 2000   2000, 12/2018, 08/2019    Arthritis    Cervical radiculopathy 02/28/2011   Cocaine substance abuse (HCC) 05/26/2013   positive UDS    Duodenitis    Erosive gastropathy    ESRD on hemodialysis (HCC)    TTS   GERD (gastroesophageal reflux disease)    Hepatitis C 1987   dt hx IVDA.  genotype 2B.  Epclusa started early 04/2020.     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  Chronic microvascular ischemic changes w parenchymal volume loss. Chronic white matter periventricular microhemorrhage, likely due to htn   Ulcer 1990   gastric ulcer. Ruptured s/p emergency repair   Past Surgical History:  Procedure Laterality Date   ABDOMINAL HYSTERECTOMY  1979   AV FISTULA PLACEMENT Left 06/16/2017   Procedure: ARTERIOVENOUS (AV) FISTULA CREATION LEFT ARM;  Surgeon: Fransisco Hertz, MD;  Location: [facility] OR;  Service: Vascular;  Laterality: Left;   BASCILIC VEIN TRANSPOSITION Left 10/02/2017   Procedure: BASILIC VEIN TRANSPOSITION SECOND STAGE LEFT ARM;  Surgeon: Larina Earthly, MD;  Location: [facility] OR;  Service: Vascular;  Laterality: Left;   BIOPSY  09/06/2019   Procedure: BIOPSY;  Surgeon: Iva Boop, MD;  Location: [facility] ENDOSCOPY;  Service: Endoscopy;;   BIOPSY  03/13/2023   Procedure: BIOPSY;  Surgeon: Shellia Cleverly, DO;  Location: [facility] ENDOSCOPY;  Service: Gastroenterology;;   COLONOSCOPY WITH PROPOFOL N/A 03/13/2023   Procedure: COLONOSCOPY WITH PROPOFOL;  Surgeon: Shellia Cleverly, DO;  Location: [facility] ENDOSCOPY;  Service: Gastroenterology;  Laterality: N/A;   ENTEROSCOPY N/A 10/22/2022   Procedure: ENTEROSCOPY;  Surgeon: Sherrilyn Rist, MD;  Location: [facility] ENDOSCOPY;  Service: Gastroenterology;  Laterality: N/A;   ESOPHAGOGASTRODUODENOSCOPY N/A 05/29/2013   Procedure:  ESOPHAGOGASTRODUODENOSCOPY (EGD);  Surgeon: Beverley Fiedler, MD;  Location: [facility] ENDOSCOPY;  Service: Endoscopy;  Laterality: N/A;   ESOPHAGOGASTRODUODENOSCOPY  05/2013   for epigastric pain.  Nonobstructing Schatzki ring at GEJ, mild gastropathy, nonbleeding AVMs in bulb and D2. 5 mm sessile polyp in bulb.   ESOPHAGOGASTRODUODENOSCOPY (EGD) WITH PROPOFOL N/A 09/06/2019   Procedure: ESOPHAGOGASTRODUODENOSCOPY (EGD) WITH PROPOFOL;  Surgeon: Iva Boop, MD;  Location: [facility] ENDOSCOPY;  Service: Endoscopy;  Laterality: N/A;   ESOPHAGOGASTRODUODENOSCOPY (EGD) WITH PROPOFOL N/A 02/02/2020   Procedure: ESOPHAGOGASTRODUODENOSCOPY (EGD) WITH PROPOFOL;  Surgeon: Rachael Fee, MD;  Location: [facility] ENDOSCOPY;  Service: Endoscopy;  Laterality: N/A;   ESOPHAGOGASTRODUODENOSCOPY (EGD) WITH PROPOFOL N/A 02/07/2021   Procedure: ESOPHAGOGASTRODUODENOSCOPY (EGD) WITH PROPOFOL;  Surgeon: Rachael Fee, MD;  Location: [facility] ENDOSCOPY;  Service: Endoscopy;  Laterality: N/A;   ESOPHAGOGASTRODUODENOSCOPY (EGD) WITH PROPOFOL N/A 03/13/2023   Procedure: ESOPHAGOGASTRODUODENOSCOPY (EGD) WITH PROPOFOL;  Surgeon: Shellia Cleverly, DO;  Location: [facility] ENDOSCOPY;  Service: Gastroenterology;  Laterality: N/A;   EUS N/A 02/02/2020   Procedure: UPPER ENDOSCOPIC ULTRASOUND (EUS) RADIAL;  Surgeon: Rachael Fee, MD;  Location: [facility] ENDOSCOPY;  Service: Endoscopy;  Laterality: N/A;   EXCHANGE OF A DIALYSIS CATHETER Left 07/31/2017   Procedure: Removal  OF A  Right GroinTUNNELED  DIALYSIS CATHETER ,  Insertion of Left Femoral Dialysis Catheter.;  Surgeon: Larina Earthly, MD;  Location: [facility] OR;  Service: Vascular;  Laterality: Left;   HEMOSTASIS CLIP PLACEMENT  02/07/2021   Procedure: HEMOSTASIS CLIP PLACEMENT;  Surgeon: Rachael Fee, MD;  Location: [facility] ENDOSCOPY;  Service: Endoscopy;;   HEMOSTASIS CLIP PLACEMENT  10/22/2022   Procedure: HEMOSTASIS CLIP PLACEMENT;  Surgeon: Sherrilyn Rist, MD;  Location: [facility] ENDOSCOPY;  Service:  Gastroenterology;;   HEMOSTASIS CLIP PLACEMENT  03/13/2023   Procedure: HEMOSTASIS CLIP PLACEMENT;  Surgeon: Shellia Cleverly, DO;  Location: [facility] ENDOSCOPY;  Service: Gastroenterology;;   HOT HEMOSTASIS N/A 09/06/2019   Procedure: HOT HEMOSTASIS (ARGON PLASMA COAGULATION/BICAP);  Surgeon: Iva Boop, MD;  Location: [facility] ENDOSCOPY;  Service: Endoscopy;  Laterality: N/A;   HOT HEMOSTASIS N/A 02/07/2021   Procedure: HOT HEMOSTASIS (ARGON PLASMA COAGULATION/BICAP);  Surgeon: Rachael Fee, MD;  Location: [facility] ENDOSCOPY;  Service: Endoscopy;  Laterality: N/A;   HOT HEMOSTASIS N/A 10/22/2022   Procedure: HOT HEMOSTASIS (ARGON PLASMA COAGULATION/BICAP);  Surgeon: Sherrilyn Rist, MD;  Location: [facility] ENDOSCOPY;  Service: Gastroenterology;  Laterality: N/A;   INSERTION OF DIALYSIS CATHETER Right 06/16/2017   Procedure: INSERTION OF DIALYSIS CATHETER;  Surgeon: Fransisco Hertz, MD;  Location: [facility] OR;  Service: Vascular;  Laterality: Right;   IR AV DIALY SHUNT INTRO NEEDLE/INTRACATH INITIAL W/PTA/IMG LEFT  06/21/2018   POLYPECTOMY  03/13/2023   Procedure: POLYPECTOMY;  Surgeon: Shellia Cleverly, DO;  Location: [facility] ENDOSCOPY;  Service: Gastroenterology;;   REPAIR OF PERFORATED ULCER  1990   gastric ulcer   SCLEROTHERAPY  10/22/2022   Procedure: SCLEROTHERAPY;  Surgeon: Sherrilyn Rist, MD;  Location: [facility] ENDOSCOPY;  Service: Gastroenterology;;   SUBMUCOSAL TATTOO INJECTION  10/22/2022   Procedure: SUBMUCOSAL TATTOO INJECTION;  Surgeon:  Sherrilyn Rist, MD;  Location: [facility] ENDOSCOPY;  Service: Gastroenterology;;   Social History:  reports that she has been smoking cigarettes. She has a 10 pack-year smoking history. She has never used smokeless tobacco. She reports that she does not currently use drugs after having used the following drugs: Heroin, Marijuana, and Cocaine. She reports that she does not drink alcohol.  Allergies  Allergen Reactions   Aspirin Nausea And Vomiting    Stomach ache   Ibuprofen  Nausea And Vomiting    Stomach ache    Family History  Problem Relation Age of Onset   Hypertension Father    Cancer Father    Hyperlipidemia Father    Seizures Sister    Early death Daughter    Kidney disease Daughter        end stage dialysis dependent    Breast cancer Maternal Aunt     Prior to Admission medications   Medication Sig Start Date End Date Taking? Authorizing Provider  amLODipine (NORVASC) 10 MG tablet Take 1 tablet (10 mg total) by mouth daily. Patient taking differently: Take 10 mg by mouth daily at 12 noon. 11/11/16  Yes Pincus Large, DO  CREON 12000-38000 units CPEP capsule Take 4 capsules by mouth 2 (two) times daily with a meal. 03/02/23  Yes [provider]  gabapentin (NEURONTIN) 100 MG capsule Take 200 mg by mouth at bedtime.   Yes [provider]  hydrALAZINE (APRESOLINE) 25 MG tablet Take 3 tablets (75 mg total) by mouth every 8 (eight) hours. 11/14/20 03/09/24 Yes [place], Eric J, DO  sevelamer carbonate (RENVELA) 800 MG tablet Take 800 mg by mouth 3 (three) times daily with meals. 07/23/20  Yes [provider]  sucralfate (CARAFATE) 1 g tablet Take 1 tablet (1 g total) by mouth 4 (four) times daily -  with meals and at bedtime. 05/16/23  Yes Roxy Horseman, PA-C  oxyCODONE-acetaminophen (PERCOCET) 5-325 MG tablet Take 1-2 tablets by mouth every 6 (six) hours as needed. Patient not taking: Reported on 05/21/2023 05/16/23   Roxy Horseman, PA-C  pantoprazole (PROTONIX) 40 MG tablet Take 1 tablet (40 mg total) by mouth daily. 03/05/23 06/03/23  Smitty Knudsen, PA-C    Physical Exam: Vitals:   05/21/23 0610 05/21/23 0615 05/21/23 0630 05/21/23 0645  BP: (!) 260/77 (!) 163/67 (!) 145/63 (!) 168/71  Pulse: 66 66 71 69  Resp: 18 13 12 11   Temp: 98.2 F (36.8 C)     TempSrc: Oral     SpO2: 92% 93% (!) 72% 98%  Weight:      Height:       Constitutional: NAD, calm, comfortable Respiratory: On posterior exam diffuse crackles.  Normal respiratory effort. No accessory muscle use.  2 L O2 Cardiovascular: Regular rate and rhythm, no murmurs / rubs / gallops. No extremity edema. 2+ pedal pulses.  No obvious peripheral edema Abdomen: Diffuse tenderness but more pronounced in the epigastrium, no masses palpated. No obvious hepatosplenomegaly. Bowel sounds positive.  Musculoskeletal: no clubbing / cyanosis. No joint deformity upper and lower extremities. Good ROM, no contractures. Normal muscle tone.  Skin: no rashes, lesions, ulcers. No induration Neurologic: CN 2-12 grossly intact. Sensation intact, DTR normal. Strength 5/5 x all 4 extremities.  Psychiatric: Normal judgment and insight. Alert and oriented x 3. Normal mood.     Data Reviewed:  As per HPI  Assessment and Plan: Abdominal pain likely due to acute infectious gastroenteritis/history of pancreatitis As history of peptic ulcer  disease with prior GI bleeding Began Protonix IV every 12 hours, continue preadmission Carafate Fecal occult blood-GI positive-if positive for blood will discontinue heparin Apparently having diarrhea at home so we will check GI pathogen panel Lipase elevated at 65 no evidence of pancreatitis on CT-repeat lipase.  Elevation in lipase likely secondary to viral gastroenteritis  History of chronic pancreatitis - resume her Creon Clear liquids Rule out UTI Low-dose IV Dilaudid for pain, Zofran for nausea  Chronic kidney disease stage V on dialysis MWF/metabolic acidosis Nephrology notified of need for consultation Acidosis secondary to chronic kidney disease and need for acute dialysis Potassium 5.1 and patient with minimal respiratory symptoms noting room air sats were 93% and has been placed on 2 L oxygen Bone mineral disease management per renal  Hypertension Blood pressure elevated and likely influenced by volume overload from missed dialysis Continue Norvasc and Apresoline  Dyslipidemia Diet controlled  History of  polysubstance abuse/tobacco abuse Check urine drug screen      Advance Care Planning:   Code Status: Full Code   VTE prophylaxis: Subcutaneous heparin  Consults: Nephrology  Family Communication: Patient  Severity of Illness: The appropriate patient status for this patient is INPATIENT. Inpatient status is judged to be reasonable and necessary in order to provide the required intensity of service to ensure the patient's safety. The patient's presenting symptoms, physical exam findings, and initial radiographic and laboratory data in the context of their chronic comorbidities is felt to place them at high risk for further clinical deterioration. Furthermore, it is not anticipated that the patient will be medically stable for discharge from the hospital within 2 midnights of admission.   * I certify that at the point of admission it is my clinical judgment that the patient will require inpatient hospital care spanning beyond 2 midnights from the point of admission due to high intensity of service, high risk for further deterioration and high frequency of surveillance required.*  Author: Junious Silk, NP 05/21/2023 7:37 AM  For on call review [email or web address].

## 2023-05-21 NOTE — Consult Note (Signed)
Renal Service Consult Note [facility]  Ashley [name] 05/21/2023 [name], MD Requesting Physician: Junious Silk, NP  Reason for Consult: ESRD pt w/ abd pain HPI: The patient is a 72 y.o. year-old w/ PMH as below who presented to ED w/ c/o abdominal pain for 1-2 weeks and some diarrhea. Missed HD yest due to these symptoms. In ED VSS, K 5.1, creat 11, BS 95. WBC wnl. CT showed possible acute gastroenteritis. SpO2 dropped to 93% and 2L O2 was started. CXR showed persistnet vasc congestion. Pt was admitted. Asked to see for dialysis.   Seen in room. Good compliance w/ HD usually. Takes [facility] bus to HD, lives w/ her daughter. No SOB, leg swelling. Not on home O2.     ROS - denies CP, no joint pain, no HA, no blurry vision, no rash, no diarrhea, no nausea/ vomiting, no dysuria, no difficulty voiding   Past Medical History  Past Medical History:  Diagnosis Date   Acute pancreatitis 2000   2000, 12/2018, 08/2019   Arthritis    Cervical radiculopathy 02/28/2011   Cocaine substance abuse (HCC) 05/26/2013   positive UDS    Duodenitis    Erosive gastropathy    ESRD on hemodialysis (HCC)    TTS   GERD (gastroesophageal reflux disease)    Hepatitis C 1987   dt hx IVDA.  genotype 2B.  Epclusa started early 04/2020.     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  Chronic microvascular ischemic changes w parenchymal volume loss. Chronic white matter periventricular microhemorrhage, likely due to htn   Ulcer 1990   gastric ulcer. Ruptured s/p emergency repair   Past Surgical History  Past Surgical History:  Procedure Laterality Date   ABDOMINAL HYSTERECTOMY  1979   AV FISTULA PLACEMENT Left 06/16/2017   Procedure:  ARTERIOVENOUS (AV) FISTULA CREATION LEFT ARM;  Surgeon: Fransisco Hertz, MD;  Location: [facility] OR;  Service: Vascular;  Laterality: Left;   BASCILIC VEIN TRANSPOSITION Left 10/02/2017   Procedure: BASILIC VEIN TRANSPOSITION SECOND STAGE LEFT ARM;  Surgeon: Larina Earthly, MD;  Location: [facility] OR;  Service: Vascular;  Laterality: Left;   BIOPSY  09/06/2019   Procedure: BIOPSY;  Surgeon: Iva Boop, MD;  Location: [facility] ENDOSCOPY;  Service: Endoscopy;;   BIOPSY  03/13/2023   Procedure: BIOPSY;  Surgeon: Shellia Cleverly, DO;  Location: [facility] ENDOSCOPY;  Service: Gastroenterology;;   COLONOSCOPY WITH PROPOFOL N/A 03/13/2023   Procedure: COLONOSCOPY WITH PROPOFOL;  Surgeon: Shellia Cleverly, DO;  Location: [facility] ENDOSCOPY;  Service: Gastroenterology;  Laterality: N/A;   ENTEROSCOPY N/A 10/22/2022   Procedure: ENTEROSCOPY;  Surgeon: Sherrilyn Rist, MD;  Location: [facility] ENDOSCOPY;  Service: Gastroenterology;  Laterality: N/A;   ESOPHAGOGASTRODUODENOSCOPY N/A 05/29/2013   Procedure: ESOPHAGOGASTRODUODENOSCOPY (EGD);  Surgeon: Beverley Fiedler, MD;  Location: [facility] ENDOSCOPY;  Service: Endoscopy;  Laterality: N/A;   ESOPHAGOGASTRODUODENOSCOPY  05/2013   for epigastric pain.  Nonobstructing Schatzki ring at GEJ, mild gastropathy, nonbleeding AVMs in bulb and D2. 5 mm sessile polyp in bulb.   ESOPHAGOGASTRODUODENOSCOPY (EGD) WITH PROPOFOL N/A 09/06/2019   Procedure: ESOPHAGOGASTRODUODENOSCOPY (EGD) WITH PROPOFOL;  Surgeon: Iva Boop, MD;  Location: [facility] ENDOSCOPY;  Service: Endoscopy;  Laterality: N/A;   ESOPHAGOGASTRODUODENOSCOPY (EGD) WITH PROPOFOL N/A 02/02/2020   Procedure: ESOPHAGOGASTRODUODENOSCOPY (EGD) WITH PROPOFOL;  Surgeon: Rachael Fee, MD;  Location: [facility] ENDOSCOPY;  Service: Endoscopy;  Laterality: N/A;   ESOPHAGOGASTRODUODENOSCOPY (EGD) WITH PROPOFOL N/A 02/07/2021   Procedure: ESOPHAGOGASTRODUODENOSCOPY (EGD) WITH PROPOFOL;  Surgeon: Rachael Fee, MD;  Location: [facility] ENDOSCOPY;  Service: Endoscopy;   Laterality: N/A;   ESOPHAGOGASTRODUODENOSCOPY (EGD) WITH PROPOFOL N/A 03/13/2023   Procedure: ESOPHAGOGASTRODUODENOSCOPY (EGD) WITH PROPOFOL;  Surgeon: Shellia Cleverly, DO;  Location: [facility] ENDOSCOPY;  Service: Gastroenterology;  Laterality: N/A;   EUS N/A 02/02/2020   Procedure: UPPER ENDOSCOPIC ULTRASOUND (EUS) RADIAL;  Surgeon: Rachael Fee, MD;  Location: [facility] ENDOSCOPY;  Service: Endoscopy;  Laterality: N/A;   EXCHANGE OF A DIALYSIS CATHETER Left 07/31/2017   Procedure: Removal  OF A  Right GroinTUNNELED  DIALYSIS CATHETER ,  Insertion of Left Femoral Dialysis Catheter.;  Surgeon: Larina Earthly, MD;  Location: [facility] OR;  Service: Vascular;  Laterality: Left;   HEMOSTASIS CLIP PLACEMENT  02/07/2021   Procedure: HEMOSTASIS CLIP PLACEMENT;  Surgeon: Rachael Fee, MD;  Location: [facility] ENDOSCOPY;  Service: Endoscopy;;   HEMOSTASIS CLIP PLACEMENT  10/22/2022   Procedure: HEMOSTASIS CLIP PLACEMENT;  Surgeon: Sherrilyn Rist, MD;  Location: [facility] ENDOSCOPY;  Service: Gastroenterology;;   HEMOSTASIS CLIP PLACEMENT  03/13/2023   Procedure: HEMOSTASIS CLIP PLACEMENT;  Surgeon: Shellia Cleverly, DO;  Location: [facility] ENDOSCOPY;  Service: Gastroenterology;;   HOT HEMOSTASIS N/A 09/06/2019   Procedure: HOT HEMOSTASIS (ARGON PLASMA COAGULATION/BICAP);  Surgeon: Iva Boop, MD;  Location: [facility] ENDOSCOPY;  Service: Endoscopy;  Laterality: N/A;   HOT HEMOSTASIS N/A 02/07/2021   Procedure: HOT HEMOSTASIS (ARGON PLASMA COAGULATION/BICAP);  Surgeon: Rachael Fee, MD;  Location: [facility] ENDOSCOPY;  Service: Endoscopy;  Laterality: N/A;   HOT HEMOSTASIS N/A 10/22/2022   Procedure: HOT HEMOSTASIS (ARGON PLASMA COAGULATION/BICAP);  Surgeon: Sherrilyn Rist, MD;  Location: [facility] ENDOSCOPY;  Service: Gastroenterology;  Laterality: N/A;   INSERTION OF DIALYSIS CATHETER Right 06/16/2017   Procedure: INSERTION OF DIALYSIS CATHETER;  Surgeon: Fransisco Hertz, MD;  Location: [facility] OR;  Service: Vascular;  Laterality: Right;   IR AV DIALY  SHUNT INTRO NEEDLE/INTRACATH INITIAL W/PTA/IMG LEFT  06/21/2018   POLYPECTOMY  03/13/2023   Procedure: POLYPECTOMY;  Surgeon: Shellia Cleverly, DO;  Location: [facility] ENDOSCOPY;  Service: Gastroenterology;;   REPAIR OF PERFORATED ULCER  1990   gastric ulcer   SCLEROTHERAPY  10/22/2022   Procedure: SCLEROTHERAPY;  Surgeon: Sherrilyn Rist, MD;  Location: [facility] ENDOSCOPY;  Service: Gastroenterology;;   SUBMUCOSAL TATTOO INJECTION  10/22/2022   Procedure: SUBMUCOSAL TATTOO INJECTION;  Surgeon: Sherrilyn Rist, MD;  Location: [facility] ENDOSCOPY;  Service: Gastroenterology;;   Family History  Family History  Problem Relation Age of Onset   Hypertension Father    Cancer Father    Hyperlipidemia Father    Seizures Sister    Early death Daughter    Kidney disease Daughter        end stage dialysis dependent    Breast cancer Maternal Aunt    Social History  reports that she has been smoking cigarettes. She has a 10 pack-year smoking history. She has never used smokeless tobacco. She reports that she does not currently use drugs after having used the following drugs: Heroin, Marijuana, and Cocaine. She reports that she does not drink alcohol. Allergies  Allergies  Allergen Reactions   Aspirin Nausea And Vomiting  Stomach ache   Ibuprofen Nausea And Vomiting    Stomach ache   Home medications Prior to Admission medications   Medication Sig Start Date End Date Taking? Authorizing Provider  amLODipine (NORVASC) 10 MG tablet Take 1 tablet (10 mg total) by mouth daily. Patient taking differently: Take 10 mg by mouth daily at 12 noon. 11/11/16  Yes Pincus Large, DO  CREON 12000-38000 units CPEP capsule Take 4 capsules by mouth 2 (two) times daily with a meal. 03/02/23  Yes [provider]  gabapentin (NEURONTIN) 100 MG capsule Take 200 mg by mouth at bedtime.   Yes [provider]  hydrALAZINE (APRESOLINE) 25 MG tablet Take 3 tablets (75 mg total) by mouth every 8 (eight) hours.  11/14/20 03/09/24 Yes [place], Eric J, DO  sevelamer carbonate (RENVELA) 800 MG tablet Take 800 mg by mouth 3 (three) times daily with meals. 07/23/20  Yes [provider]  sucralfate (CARAFATE) 1 g tablet Take 1 tablet (1 g total) by mouth 4 (four) times daily -  with meals and at bedtime. 05/16/23  Yes Roxy Horseman, PA-C  oxyCODONE-acetaminophen (PERCOCET) 5-325 MG tablet Take 1-2 tablets by mouth every 6 (six) hours as needed. Patient not taking: Reported on 05/21/2023 05/16/23   Roxy Horseman, PA-C  pantoprazole (PROTONIX) 40 MG tablet Take 1 tablet (40 mg total) by mouth daily. 03/05/23 06/03/23  Barrett, Horald Chestnut, PA-C     Vitals:   05/21/23 0800 05/21/23 0845 05/21/23 0920 05/21/23 0958  BP: (!) 163/66 (!) 160/64 (!) 160/64   Pulse: 64 63    Resp: 13 11    Temp:    98.2 F (36.8 C)  TempSrc:    Oral  SpO2: 99% 95%    Weight:      Height:       Exam Gen alert, no distress No rash, cyanosis or gangrene Sclera anicteric, throat clear  No jvd or bruits Chest clear bilat to bases, no rales/ wheezing RRR no MRG Abd soft ntnd no mass or ascites +bs GU defer MS no joint effusions or deformity Ext no LE or UE edema, no wounds or ulcers Neuro is alert, Ox 3 , nf    LUA AVF+bruit      Renal-related home meds: - norvasc, hydralazine - gabapentin 200 hs - renvela 800 tid    OP HD: [facility]WF [facility]  3.5h   350/ 500  46.2kg   2/2.5 bath   AVF   Heparin none - last HD 12/09, post wt 46.3kg - getting to dry wt mostly   Assessment/ Plan: Abdominal pain/ diarrhea - CT showed possible acute gastroenteritis. Per pmd.  ESKD - on HD MWF. Missed Wed. Short HD today and regular HD tomorrow.  HTN - normal BP's  Volume - CXR w/ vasc congestion. No edema on exam. UF 2-2.5 L as tol Anemia of eskd - Hb 11, follow MBD ckd - CCa and phos are stable. Cont binders w/ meals.       Vinson Moselle  MD [facility] 05/21/2023, 10:34 AM  Recent Labs  Lab 05/20/23 2002 05/21/23 0525  HGB 12.1  11.3*  ALBUMIN 3.3* 3.0*  CALCIUM 8.8* 8.4*  PHOS  --  6.0*  CREATININE 10.71* 11.19*  K 4.9 5.1   Inpatient medications:  amLODipine  10 mg Oral Q1200   gabapentin  200 mg Oral QHS   heparin injection (subcutaneous)  5,000 Units Subcutaneous Q8H   hydrALAZINE  75 mg Oral Q8H   lipase/protease/amylase  48,000  Units Oral BID WC   pantoprazole (PROTONIX) IV  40 mg Intravenous Q12H   sevelamer carbonate  800 mg Oral TID WC    promethazine (PHENERGAN) injection (IM or IVPB)     acetaminophen **OR** acetaminophen, HYDROmorphone (DILAUDID) injection, melatonin, naLOXone (NARCAN)  injection, ondansetron (ZOFRAN) IV, promethazine (PHENERGAN) injection (IM or IVPB)

## 2023-05-21 NOTE — ED Notes (Signed)
 CCMD called.

## 2023-05-21 NOTE — ED Notes (Signed)
Urine orders cancelled because pt states she does not make urine any longer.

## 2023-05-21 NOTE — Progress Notes (Signed)
   05/21/23 1621  TOC Brief Assessment  Insurance and Status [facility]ed [facility] Medicare Dual Complete)  Patient has primary care physician Yes Katrinka Blazing, Marlynn Perking., FNP)  Home environment has been reviewed From home  Prior level of function: Independent  Prior/Current Home Services No current home services (Has had [facility] in the past)  Social Drivers of Health Review SDOH reviewed interventions complete (Smoking cessation information attached)  Readmission risk has been reviewed Yes (43%)  Transition of care needs no transition of care needs at this time    Please place [facility] consult should needs arise

## 2023-05-21 NOTE — Progress Notes (Signed)
Carryover admission to the Day Admitter.  I discussed this case with the EDP, Carmel [place], PA.  Per these discussions:   This is a 72 year old female with end-stage renal disease on hemodialysis on Monday, Wednesday, Friday schedule, pancreatitis, who is being admitted with persistent abdominal pain complicated by intractable nausea/vomiting over the last 2 to 3 days, with very limited oral intake occurring over the last 2 days.  Abdominal discomfort is diffuse in nature.  The patient reports a history of pancreatitis, but notes that her presenting abdominal discomfort is different in terms of the distribution, noting that she had focal tenderness associate with her pancreatitis relative to her diffuse abdominal discomfort over the last few days.  I would consequence of her recurrent nausea/vomiting, she notes that she missed her routine scheduled hemodialysis session today (Wednesday, 05/20/2023), but confirms that she attended her hemodialysis session on Monday, 05/18/2023.  Vital signs notable for afebrile, oxygen saturation low 90s on room air.  Presenting labs notable for potassium 4.9, bicarbonate 24.  CT abdomen/pelvis was reported to show chronic changes in the absence of any acute intra-abdominal or acute intrapelvic process, including no evidence of acute pancreatitis or any evidence of bowel obstruction.  I have placed an order for inpatient admission to pcu for further evaluation management of the above.  I have placed some additional preliminary admit orders via the adult multi-morbid admission order set. I have also ordered prn IV Zofran as well as as needed IV Phenergan for refractory nausea/vomiting.  Prn IV Dilaudid for abdominal discomfort.  I have also ordered morning labs in the form of CMP, CBC, magnesium and phosphorus levels.    Newton Pigg, DO Hospitalist

## 2023-05-22 DIAGNOSIS — K529 Noninfective gastroenteritis and colitis, unspecified: Secondary | ICD-10-CM | POA: Diagnosis not present

## 2023-05-22 LAB — CBC
HCT: 34.8 % — ABNORMAL LOW (ref 36.0–46.0)
Hemoglobin: 11.1 g/dL — ABNORMAL LOW (ref 12.0–15.0)
MCH: 27.2 pg (ref 26.0–34.0)
MCHC: 31.9 g/dL (ref 30.0–36.0)
MCV: 85.3 fL (ref 80.0–100.0)
Platelets: 276 10*3/uL (ref 150–400)
RBC: 4.08 MIL/uL (ref 3.87–5.11)
RDW: 17.9 % — ABNORMAL HIGH (ref 11.5–15.5)
WBC: 6.1 10*3/uL (ref 4.0–10.5)
nRBC: 0 % (ref 0.0–0.2)

## 2023-05-22 LAB — RENAL FUNCTION PANEL
Albumin: 2.9 g/dL — ABNORMAL LOW (ref 3.5–5.0)
Anion gap: 19 — ABNORMAL HIGH (ref 5–15)
BUN: 54 mg/dL — ABNORMAL HIGH (ref 8–23)
CO2: 22 mmol/L (ref 22–32)
Calcium: 8.8 mg/dL — ABNORMAL LOW (ref 8.9–10.3)
Chloride: 93 mmol/L — ABNORMAL LOW (ref 98–111)
Creatinine, Ser: 12.78 mg/dL — ABNORMAL HIGH (ref 0.44–1.00)
GFR, Estimated: 3 mL/min — ABNORMAL LOW (ref >60)
Glucose, Bld: 82 mg/dL (ref 70–99)
Phosphorus: 7.5 mg/dL — ABNORMAL HIGH (ref 2.5–4.6)
Potassium: 5.6 mmol/L — ABNORMAL HIGH (ref 3.5–5.1)
Sodium: 134 mmol/L — ABNORMAL LOW (ref 135–145)

## 2023-05-22 LAB — HEPATITIS B SURFACE ANTIBODY, QUANTITATIVE: Hep B S AB Quant (Post): 61.3 m[IU]/mL

## 2023-05-22 NOTE — Progress Notes (Signed)
 [facility] Progress Note   Subjective: Awake, alert, No C/Os. Seen prior to starting HD.      Objective Vitals:   05/22/23 0551 05/22/23 0800 05/22/23 0852 05/22/23 0906  BP: (!) 150/65 (!) 142/63  (!) 140/59  Pulse:  67  66  Resp:  11  13  Temp:  97.9 F (36.6 C)    TempSrc:  Oral    SpO2:  100%  97%  Weight:   47.1 kg   Height:       Physical Exam General: Pleasant older female in NAD Heart: S1,S2 RRR SR on monitor.  Lungs: CTAB  Abdomen: NABS, NT Extremities: No LE edema Dialysis Access: L AVF Cannulated   Additional Objective Labs: Basic Metabolic Panel: Recent Labs  Lab 05/21/23 0525 05/21/23 1531 05/22/23 0752  NA 139 137 134*  K 5.1 5.5* 5.6*  CL 100 95* 93*  CO2 23 21* 22  GLUCOSE 72 74 82  BUN 45* 50* 54*  CREATININE 11.19* 11.87* 12.78*  CALCIUM 8.4* 8.6* 8.8*  PHOS 6.0* 7.3* 7.5*   Liver Function Tests: Recent Labs  Lab 05/15/23 2310 05/20/23 2002 05/21/23 0525 05/21/23 1531 05/22/23 0752  AST 28 17 11*  --   --   ALT 12 7 8   --   --   ALKPHOS 62 56 52  --   --   BILITOT 1.1 0.7 0.8  --   --   PROT 7.1 7.0 6.4*  --   --   ALBUMIN 3.2* 3.3* 3.0* 3.3* 2.9*   Recent Labs  Lab 05/15/23 2310 05/20/23 2002 05/21/23 0525  LIPASE 64* 65* 53*   CBC: Recent Labs  Lab 05/15/23 2356 05/20/23 2002 05/21/23 0525 05/22/23 0551  WBC 7.1 5.8 5.3 6.1  NEUTROABS 4.4  --  3.2  --   HGB 11.5* 12.1 11.3* 11.1*  HCT 36.6 39.2 36.5 34.8*  MCV 86.9 85.8 87.5 85.3  PLT 335 314 290 276   Blood Culture    Component Value Date/Time   SDES URINE, RANDOM 06/20/2017 2222   SPECREQUEST NONE 06/20/2017 2222   CULT MULTIPLE SPECIES PRESENT, SUGGEST RECOLLECTION (A) 06/20/2017 2222   REPTSTATUS 06/22/2017 FINAL 06/20/2017 2222    Cardiac Enzymes: No results for input(s): "CKTOTAL", "CKMB", "CKMBINDEX", "TROPONINI" in the last 168 hours. CBG: No results for input(s): "GLUCAP" in the last 168 hours. Iron Studies: No results for  input(s): "IRON", "TIBC", "TRANSFERRIN", "FERRITIN" in the last 72 hours. @lablastinr3 @ Studies/Results: DG Chest 2 View Result Date: 05/21/2023 CLINICAL DATA:  Generalized abdominal pain for 2 weeks with diarrhea. EXAM: CHEST - 2 VIEW COMPARISON:  05/15/2023. FINDINGS: Mild pulmonary vascular congestion, similar to the prior study. No frank pulmonary edema. Bilateral lung fields are otherwise clear. No acute consolidation or lung collapse. Bilateral costophrenic angles are clear. Stable cardio-mediastinal silhouette. No acute osseous abnormalities. Left brachial/axillary vessel vascular stent again seen. The soft tissues are within normal limits. IMPRESSION: *No active cardiopulmonary disease. Persistent mild pulmonary vascular congestion without frank pulmonary edema. Electronically Signed   By: Jules Schick M.D.   On: 05/21/2023 09:12   CT ABDOMEN PELVIS WO CONTRAST Result Date: 05/20/2023 CLINICAL DATA:  Abdominal pain and diarrhea for 2 weeks, worse with eating. Dialysis patient, skipped today's appointment because she did not feel well. EXAM: CT ABDOMEN AND PELVIS WITHOUT CONTRAST TECHNIQUE: Multidetector CT imaging of the abdomen and pelvis was performed following the standard protocol without IV contrast. RADIATION DOSE REDUCTION: This exam was performed according to the  departmental dose-optimization program which includes automated exposure control, adjustment of the mA and/or kV according to patient size and/or use of iterative reconstruction technique. COMPARISON:  Multiple prior CTs back to 2006. The 2 most recent are CT without contrast 05/16/2023, CT with contrast 04/24/2023 FINDINGS: Lower chest: Bochdalek's fat herniation again noted through the posterior left hemidiaphragm into the lower chest. There is increased patchy haziness in the posterior left lower lobe which could be asymmetric atelectasis or a small pneumonia. Remaining lung bases are clear. There is mild cardiomegaly, minimal  anterior pericardial effusion, calcification in the right coronary artery. Hepatobiliary: No focal liver abnormality is seen. No gallstones, gallbladder wall thickening, or biliary dilatation. Pancreas: Unremarkable without contrast. Spleen: Unremarkable without contrast.  No splenomegaly. Adrenals/Urinary Tract: Bilateral adrenal adenomatous hyperplasia. Chronically atrophic kidneys consistent with ESRD. 1.3 cm Bosniak 1 cyst again noted anterior right kidney, Hounsfield density is 11.2. No follow-up imaging recommended. There is no mass enhancement in either kidney, no urinary stone or obstruction. The bladder appears thickened but is also contracted and not optimally seen. Correlate clinically for potential cystitis. Stomach/Bowel: Contracted stomach with chronic thickened folds. Endoscopy clips again noted in the duodenum and colon. There are thickened folds in the left abdominal small bowel but no small bowel dilatation or inflammatory change. The appendix is not as well seen as on the prior studies but the visualized portions are normal. There is no wall thickening or dilatation of the colon as well as can be seen without enteric contrast. There is sigmoid diverticulosis without diverticulitis. Vascular/Lymphatic: Extensive aortoiliac calcific plaque without AAA. Less heavy visceral branch vessel atherosclerosis. No lymphadenopathy is seen. Reproductive: Status post hysterectomy. No adnexal masses. Other: There is mild body wall anasarca and mesenteric congestion. Trace presacral pelvic ascites. No free hemorrhage, free air or incarcerated hernia. No localizing collections or focal inflammatory process. Probable phleboliths. Musculoskeletal: No acute or significant osseous findings. IMPRESSION: 1. Increased patchy haziness in the posterior left lower lobe which could be asymmetric atelectasis or a small pneumonia. 2. Cardiomegaly with minimal anterior pericardial effusion. 3. Aortic and coronary artery  atherosclerosis. 4. Chronically atrophic kidneys of ESRD. 5. Gastroenteritis. 6. Trace presacral pelvic ascites. 7. Body wall anasarca with mild mesenteric congestion. 8. Sigmoid diverticulosis without evidence of diverticulitis. 9. Bilateral adrenal adenomatous hyperplasia. Aortic Atherosclerosis (ICD10-I70.0). Electronically Signed   By: Almira Bar M.D.   On: 05/20/2023 22:18   Medications:  anticoagulant sodium citrate     cefTRIAXone (ROCEPHIN)  IV 2 g (05/21/23 1733)   metronidazole 500 mg (05/22/23 0433)   promethazine (PHENERGAN) injection (IM or IVPB)      amLODipine  10 mg Oral Q1200   Chlorhexidine Gluconate Cloth  6 each Topical Q0600   Chlorhexidine Gluconate Cloth  6 each Topical Q0600   gabapentin  200 mg Oral QHS   heparin injection (subcutaneous)  5,000 Units Subcutaneous Q8H   hydrALAZINE  75 mg Oral Q8H   lipase/protease/amylase  48,000 Units Oral BID WC   pantoprazole (PROTONIX) IV  40 mg Intravenous Q12H   sevelamer carbonate  800 mg Oral TID WC     OP HD: [facility]  3.5h   350/ 500  46.2kg   2/2.5 bath   AVF   Heparin none - last HD 12/09, post wt 46.3kg - getting to dry wt mostly     Assessment/ Plan: Abdominal pain/ diarrhea - CT showed possible acute gastroenteritis. Per pmd.  ESKD - on HD MWF. Missed Wed. Short HD 05/21/2023 and  regular HD tomorrow.  HTN - normal BP's  Volume - CXR w/ vasc congestion. No edema on exam. UF 2-2.5 L as tol Anemia of eskd - Hb 11, follow MBD ckd - CCa and phos are stable. Cont binders w/ meals.       Jahnay Lantier H. Rhona Fusilier NP-C 05/22/2023, 9:13 AM  [employer] [phone]

## 2023-05-22 NOTE — Progress Notes (Signed)
PROGRESS NOTE    Ashley [name]  WUJ:[id number] DOB: 1951-03-03 DOA: 05/20/2023 PCP: Raymon Mutton., FNP    Chief Complaint  Patient presents with   Abdominal Pain    Brief Narrative:    Ashley [name] is a 72 y.o. female with medical history significant of dyslipidemia, tobacco abuse, peptic ulcer disease with GI bleed, hepatitis C, hypertension, history of polysubstance abuse, prior pancreatitis, history of CVA.  Presented via EMS from home after complaining of generalized abdominal pain for 2 weeks associated with diarrhea worse after eating.  Typical dialysis days are MWF but missed this past Wednesday/date of admission due to pain.  Vital signs were stable in the ER and she was not hypoxic and CBG was 95.  Labs were stable with potassium of 5.1.  Creatinine elevated at 11.19 consistent with missed dialysis, lipase was elevated at 65.  White count normal.  CT abdomen and pelvis demonstrated patchy haziness in the posterior left lower lobe of the lung that could be asymmetric atelectasis versus small pneumonia.  There was also evidence of acute gastroenteritis.  There was body wall anasarca with mild mesenteric congestion.  O2 sats dipped into the 93 range and patient has been placed on nasal cannula oxygen.  Hospital service has been asked to evaluate the patient for admission.   Review of Systems: Patient reports dry heaves without emesis.  Diffuse abdominal pain worse in the epigastrium.  A few episodes of diarrhea not high-volume and no blood or black stool.  She reports her great grandchildren have been sick with similar symptoms.  She also reports cough without shortness of breath with productive white phlegm.  Assessment & Plan:   Principal Problem:   Abdominal pain     Abdominal pain/diarrhea Gastroenteritis Chronic pancreatitis -Patient presents with abdominal pain, imaging significant for gastroenteritis -Diarrhea seems to have much improved, but she still having abdominal  pain, nausea, awaiting GI panel and C. difficile -Continue with home dose Creon -Clear liquid diet, will advance -Continue with as needed nausea and pain medications -Continue with IV Rocephin and azithromycin   ESRD Hyperkalemia -Input greatly appreciated, she received dialysis today, she missed her dialysis on Wednesday -She is on long-term Carafate which I will discontinue.   Hypertension -Continue with home medications, continue with as needed hydralazine   Tobacco abuse -He was counseled, will keep on nicotine patch   Dyslipidemia Diet controlled   History of polysubstance abuse/tobacco abuse Check urine drug screen      DVT prophylaxis: Heparin Code Status: Full Family Communication: none at bedside Disposition:   Status is: Inpatient    Consultants:  renal   Subjective:  Diarrhea has improved, Abd pain has improved, still with nausea.  Objective: Vitals:   05/22/23 1322 05/22/23 1440 05/22/23 1441 05/22/23 1554  BP:  (!) 156/88  129/76  Pulse:  82 80 86  Resp:  13  18  Temp:  99.8 F (37.7 C)  99.6 F (37.6 C)  TempSrc:  Oral  Oral  SpO2:  98% 99% 96%  Weight: 45.9 kg     Height:        Intake/Output Summary (Last 24 hours) at 05/22/2023 1709 Last data filed at 05/22/2023 1333 Gross per 24 hour  Intake --  Output 1000 ml  Net -1000 ml   Filed Weights   05/20/23 1937 05/22/23 0852 05/22/23 1322  Weight: 46.7 kg 47.1 kg 45.9 kg    Examination:  Awake Alert, Oriented X 3, frail, chronic  ill-appearing Symmetrical Chest wall movement, Good air movement bilaterally, CTAB RRR,No Gallops,Rubs or new Murmurs, No Parasternal Heave +ve B.Sounds, Abd Soft, epigastric tenderness to palpation, No rebound - guarding or rigidity. No Cyanosis, Clubbing or edema, No new Rash or bruise      Data Reviewed: I have personally reviewed following labs and imaging studies  CBC: Recent Labs  Lab 05/15/23 2356 05/20/23 2002 05/21/23 0525  05/22/23 0551  WBC 7.1 5.8 5.3 6.1  NEUTROABS 4.4  --  3.2  --   HGB 11.5* 12.1 11.3* 11.1*  HCT 36.6 39.2 36.5 34.8*  MCV 86.9 85.8 87.5 85.3  PLT 335 314 290 276    Basic Metabolic Panel: Recent Labs  Lab 05/15/23 2310 05/15/23 2356 05/20/23 2002 05/21/23 0525 05/21/23 1531 05/22/23 0752  NA 138  --  137 139 137 134*  K 5.8* 5.0 4.9 5.1 5.5* 5.6*  CL 97*  --  95* 100 95* 93*  CO2 27  --  24 23 21* 22  GLUCOSE 94  --  86 72 74 82  BUN 29*  --  42* 45* 50* 54*  CREATININE 6.49*  --  10.71* 11.19* 11.87* 12.78*  CALCIUM 9.6  --  8.8* 8.4* 8.6* 8.8*  MG  --   --   --  1.9  --   --   PHOS  --   --   --  6.0* 7.3* 7.5*    GFR: Estimated Creatinine Clearance: 2.8 mL/min (A) (by C-G formula based on SCr of 12.78 mg/dL (H)).  Liver Function Tests: Recent Labs  Lab 05/15/23 2310 05/20/23 2002 05/21/23 0525 05/21/23 1531 05/22/23 0752  AST 28 17 11*  --   --   ALT 12 7 8   --   --   ALKPHOS 62 56 52  --   --   BILITOT 1.1 0.7 0.8  --   --   PROT 7.1 7.0 6.4*  --   --   ALBUMIN 3.2* 3.3* 3.0* 3.3* 2.9*    CBG: No results for input(s): "GLUCAP" in the last 168 hours.   No results found for this or any previous visit (from the past 240 hours).       Radiology Studies: DG Chest 2 View Result Date: 05/21/2023 CLINICAL DATA:  Generalized abdominal pain for 2 weeks with diarrhea. EXAM: CHEST - 2 VIEW COMPARISON:  05/15/2023. FINDINGS: Mild pulmonary vascular congestion, similar to the prior study. No frank pulmonary edema. Bilateral lung fields are otherwise clear. No acute consolidation or lung collapse. Bilateral costophrenic angles are clear. Stable cardio-mediastinal silhouette. No acute osseous abnormalities. Left brachial/axillary vessel vascular stent again seen. The soft tissues are within normal limits. IMPRESSION: *No active cardiopulmonary disease. Persistent mild pulmonary vascular congestion without frank pulmonary edema. Electronically Signed   By: Jules Schick M.D.   On: 05/21/2023 09:12   CT ABDOMEN PELVIS WO CONTRAST Result Date: 05/20/2023 CLINICAL DATA:  Abdominal pain and diarrhea for 2 weeks, worse with eating. Dialysis patient, skipped today's appointment because she did not feel well. EXAM: CT ABDOMEN AND PELVIS WITHOUT CONTRAST TECHNIQUE: Multidetector CT imaging of the abdomen and pelvis was performed following the standard protocol without IV contrast. RADIATION DOSE REDUCTION: This exam was performed according to the departmental dose-optimization program which includes automated exposure control, adjustment of the mA and/or kV according to patient size and/or use of iterative reconstruction technique. COMPARISON:  Multiple prior CTs back to 2006. The 2 most recent are CT without contrast 05/16/2023,  CT with contrast 04/24/2023 FINDINGS: Lower chest: Bochdalek's fat herniation again noted through the posterior left hemidiaphragm into the lower chest. There is increased patchy haziness in the posterior left lower lobe which could be asymmetric atelectasis or a small pneumonia. Remaining lung bases are clear. There is mild cardiomegaly, minimal anterior pericardial effusion, calcification in the right coronary artery. Hepatobiliary: No focal liver abnormality is seen. No gallstones, gallbladder wall thickening, or biliary dilatation. Pancreas: Unremarkable without contrast. Spleen: Unremarkable without contrast.  No splenomegaly. Adrenals/Urinary Tract: Bilateral adrenal adenomatous hyperplasia. Chronically atrophic kidneys consistent with ESRD. 1.3 cm Bosniak 1 cyst again noted anterior right kidney, Hounsfield density is 11.2. No follow-up imaging recommended. There is no mass enhancement in either kidney, no urinary stone or obstruction. The bladder appears thickened but is also contracted and not optimally seen. Correlate clinically for potential cystitis. Stomach/Bowel: Contracted stomach with chronic thickened folds. Endoscopy clips again noted  in the duodenum and colon. There are thickened folds in the left abdominal small bowel but no small bowel dilatation or inflammatory change. The appendix is not as well seen as on the prior studies but the visualized portions are normal. There is no wall thickening or dilatation of the colon as well as can be seen without enteric contrast. There is sigmoid diverticulosis without diverticulitis. Vascular/Lymphatic: Extensive aortoiliac calcific plaque without AAA. Less heavy visceral branch vessel atherosclerosis. No lymphadenopathy is seen. Reproductive: Status post hysterectomy. No adnexal masses. Other: There is mild body wall anasarca and mesenteric congestion. Trace presacral pelvic ascites. No free hemorrhage, free air or incarcerated hernia. No localizing collections or focal inflammatory process. Probable phleboliths. Musculoskeletal: No acute or significant osseous findings. IMPRESSION: 1. Increased patchy haziness in the posterior left lower lobe which could be asymmetric atelectasis or a small pneumonia. 2. Cardiomegaly with minimal anterior pericardial effusion. 3. Aortic and coronary artery atherosclerosis. 4. Chronically atrophic kidneys of ESRD. 5. Gastroenteritis. 6. Trace presacral pelvic ascites. 7. Body wall anasarca with mild mesenteric congestion. 8. Sigmoid diverticulosis without evidence of diverticulitis. 9. Bilateral adrenal adenomatous hyperplasia. Aortic Atherosclerosis (ICD10-I70.0). Electronically Signed   By: Almira Bar M.D.   On: 05/20/2023 22:18        Scheduled Meds:  amLODipine  10 mg Oral Q1200   Chlorhexidine Gluconate Cloth  6 each Topical Q0600   Chlorhexidine Gluconate Cloth  6 each Topical Q0600   gabapentin  200 mg Oral QHS   heparin injection (subcutaneous)  5,000 Units Subcutaneous Q8H   hydrALAZINE  75 mg Oral Q8H   lipase/protease/amylase  48,000 Units Oral BID WC   pantoprazole (PROTONIX) IV  40 mg Intravenous Q12H   sevelamer carbonate  800 mg Oral  TID WC   Continuous Infusions:  cefTRIAXone (ROCEPHIN)  IV Stopped (05/22/23 1230)   metronidazole 500 mg (05/22/23 0433)   promethazine (PHENERGAN) injection (IM or IVPB)       LOS: 2 days       Huey Bienenstock, MD [facility]   To contact the attending provider between 7A-7P or the covering provider during after hours 7P-7A, please log into the web site [email or web address] and access using universal [place] password for that web site. If you do not have the password, please call the hospital operator.  05/22/2023, 5:09 PM

## 2023-05-22 NOTE — Progress Notes (Signed)
PT Cancellation Note  Patient Details Name: Ashley [name] MRN: [id number] DOB: 1951-01-31   Cancelled Treatment:    Reason Eval/Treat Not Completed: Patient at procedure or test/unavailable (Pt off the floor at HD. Will follow up later if time allows.)   Gladys Damme 05/22/2023, 8:42 AM

## 2023-05-22 NOTE — Progress Notes (Signed)
OT Cancellation Note  Patient Details Name: Ashley [name] MRN: [id number] DOB: 1950/09/24   Cancelled Treatment:    Reason Eval/Treat Not Completed: Patient at procedure or test/ unavailable (Pt still at HD since AM, OT will follow-up with pt as able)  05/22/2023  AB, OTR/L  Acute Rehabilitation Services  Office: [phone]  Tristan Schroeder 05/22/2023, 12:58 PM

## 2023-05-22 NOTE — Progress Notes (Signed)
Pt receives out-pt HD at [facility] on MWF. Will assist as needed.   Cedricka Sackrider Renal Navigator [phone] 

## 2023-05-22 NOTE — Plan of Care (Signed)

## 2023-05-22 NOTE — Progress Notes (Signed)
Received patient in bed to unit.  Alert and oriented.  Informed consent signed and in chart.   TX duration:3.42  Patient tolerated well.  Transported back to the room  Alert, without acute distress.  Hand-off given to patient's nurse.   Access used: LEFT AVF Access issues: NONE  Total UF removed: 1L Medication(s) given: DILAUDID, ROCEPHIN   05/22/23 1315  Vitals  Temp 98 F (36.7 C)  Temp Source Oral  BP (!) 172/77  MAP (mmHg) 105  BP Location Right Arm  BP Method Automatic  Patient Position (if appropriate) Lying  Pulse Rate 88  Pulse Rate Source Monitor  ECG Heart Rate 87  Resp 13  Oxygen Therapy  SpO2 99 %  O2 Device Nasal Cannula  O2 Flow Rate (L/min) 2 L/min  During Treatment Monitoring  Blood Flow Rate (mL/min) 299 mL/min  Arterial Pressure (mmHg) -131.91 mmHg  Venous Pressure (mmHg) 244.63 mmHg  TMP (mmHg) 12.12 mmHg  Ultrafiltration Rate (mL/min) 979 mL/min  Dialysate Flow Rate (mL/min) 300 ml/min  Duration of HD Treatment -hour(s) 3.39 hour(s)  Cumulative Fluid Removed (mL) per Treatment  972.2  Intra-Hemodialysis Comments Tx completed;Tolerated well  Dialysis Fluid Bolus Normal Saline  Bolus Amount (mL) 300 mL      Roshanda Balazs S [facility]

## 2023-05-23 DIAGNOSIS — R1010 Upper abdominal pain, unspecified: Secondary | ICD-10-CM | POA: Diagnosis not present

## 2023-05-23 LAB — CBC
HCT: 37 % (ref 36.0–46.0)
Hemoglobin: 11.6 g/dL — ABNORMAL LOW (ref 12.0–15.0)
MCH: 26.6 pg (ref 26.0–34.0)
MCHC: 31.4 g/dL (ref 30.0–36.0)
MCV: 84.9 fL (ref 80.0–100.0)
Platelets: 254 10*3/uL (ref 150–400)
RBC: 4.36 MIL/uL (ref 3.87–5.11)
RDW: 17.8 % — ABNORMAL HIGH (ref 11.5–15.5)
WBC: 5.2 10*3/uL (ref 4.0–10.5)
nRBC: 0 % (ref 0.0–0.2)

## 2023-05-23 LAB — RENAL FUNCTION PANEL
Albumin: 3 g/dL — ABNORMAL LOW (ref 3.5–5.0)
Anion gap: 14 (ref 5–15)
BUN: 23 mg/dL (ref 8–23)
CO2: 27 mmol/L (ref 22–32)
Calcium: 9.1 mg/dL (ref 8.9–10.3)
Chloride: 92 mmol/L — ABNORMAL LOW (ref 98–111)
Creatinine, Ser: 7.1 mg/dL — ABNORMAL HIGH (ref 0.44–1.00)
GFR, Estimated: 6 mL/min — ABNORMAL LOW (ref >60)
Glucose, Bld: 87 mg/dL (ref 70–99)
Phosphorus: 5.3 mg/dL — ABNORMAL HIGH (ref 2.5–4.6)
Potassium: 4.1 mmol/L (ref 3.5–5.1)
Sodium: 133 mmol/L — ABNORMAL LOW (ref 135–145)

## 2023-05-23 MED ORDER — PANTOPRAZOLE SODIUM 40 MG PO TBEC
40.0000 mg | DELAYED_RELEASE_TABLET | Freq: Two times a day (BID) | ORAL | Status: DC
  Administered 2023-05-23 – 2023-05-27 (×8): 40 mg via ORAL
  Filled 2023-05-23 (×8): qty 1

## 2023-05-23 NOTE — Progress Notes (Signed)
PROGRESS NOTE    Ashley [name]  GEX:[id number] DOB: 03-23-1951 DOA: 05/20/2023 PCP: Raymon Mutton., FNP    Chief Complaint  Patient presents with   Abdominal Pain    Brief Narrative:    Ashley [name] is a 72 y.o. female with medical history significant of dyslipidemia, tobacco abuse, peptic ulcer disease with GI bleed, hepatitis C, hypertension, history of polysubstance abuse, prior pancreatitis, history of CVA.  Presented via EMS from home after complaining of generalized abdominal pain for 2 weeks associated with diarrhea worse after eating.  Typical dialysis days are MWF but missed this past Wednesday/date of admission due to pain.  Vital signs were stable in the ER and she was not hypoxic and CBG was 95.  Labs were stable with potassium of 5.1.  Creatinine elevated at 11.19 consistent with missed dialysis, lipase was elevated at 65.  White count normal.  CT abdomen and pelvis demonstrated patchy haziness in the posterior left lower lobe of the lung that could be asymmetric atelectasis versus small pneumonia.  There was also evidence of acute gastroenteritis.  There was body wall anasarca with mild mesenteric congestion.  O2 sats dipped into the 93 range and patient has been placed on nasal cannula oxygen.  Hospital service has been asked to evaluate the patient for admission.   Review of Systems: Patient reports dry heaves without emesis.  Diffuse abdominal pain worse in the epigastrium.  A few episodes of diarrhea not high-volume and no blood or black stool.  She reports her great grandchildren have been sick with similar symptoms.  She also reports cough without shortness of breath with productive white phlegm.  Assessment & Plan:   Principal Problem:   Abdominal pain     Abdominal pain/diarrhea Gastroenteritis Chronic pancreatitis -Patient presents with abdominal pain, imaging significant for gastroenteritis -No further diarrhea, but she still having abdominal pain, but her  oral intake has improved. -If no further diarrhea today I will will DC her C. difficile and GI panel orders -Continue with home dose Creon -Continue with as needed nausea and pain medications -Continue with IV Rocephin and azithromycin as she still having abdominal pain.   ESRD Hyperkalemia -Input greatly appreciated, she received dialysis today, she missed her dialysis on Wednesday -She is on long-term Carafate which I will discontinue.   Hypertension -Continue with home medications, continue with as needed hydralazine   Tobacco abuse -He was counseled, will keep on nicotine patch   Dyslipidemia Diet controlled   History of polysubstance abuse/tobacco abuse Check urine drug screen      DVT prophylaxis: Heparin Code Status: Full Family Communication: none at bedside Disposition:   Status is: Inpatient    Consultants:  renal   Subjective:  No further diarrhea, nausea much improved, no vomiting, but still complaining of abdominal pain Objective: Vitals:   05/22/23 2352 05/23/23 0424 05/23/23 0846 05/23/23 1213  BP: (!) 133/52 (!) 151/55 (!) 148/59 (!) 138/57  Pulse:   77 74  Resp:   16 18  Temp: 98.3 F (36.8 C) 98.3 F (36.8 C) 98.7 F (37.1 C) 98.3 F (36.8 C)  TempSrc: Oral Oral Oral Oral  SpO2:   96% 91%  Weight:      Height:        Intake/Output Summary (Last 24 hours) at 05/23/2023 1352 Last data filed at 05/23/2023 0500 Gross per 24 hour  Intake 676.59 ml  Output --  Net 676.59 ml   Filed Weights   05/20/23 1937  05/22/23 0852 05/22/23 1322  Weight: 46.7 kg 47.1 kg 45.9 kg    Examination:  Awake Alert, Oriented X 3, frail, deconditioned Symmetrical Chest wall movement, Good air movement bilaterally, CTAB RRR,No Gallops,Rubs or new Murmurs, No Parasternal Heave +ve B.Sounds, Abd Soft, mild tenderness to palpation No Cyanosis, Clubbing or edema, No new Rash or bruise       Data Reviewed: I have personally reviewed following labs and  imaging studies  CBC: Recent Labs  Lab 05/20/23 2002 05/21/23 0525 05/22/23 0551 05/23/23 0528  WBC 5.8 5.3 6.1 5.2  NEUTROABS  --  3.2  --   --   HGB 12.1 11.3* 11.1* 11.6*  HCT 39.2 36.5 34.8* 37.0  MCV 85.8 87.5 85.3 84.9  PLT 314 290 276 254    Basic Metabolic Panel: Recent Labs  Lab 05/20/23 2002 05/21/23 0525 05/21/23 1531 05/22/23 0752 05/23/23 0528  NA 137 139 137 134* 133*  K 4.9 5.1 5.5* 5.6* 4.1  CL 95* 100 95* 93* 92*  CO2 24 23 21* 22 27  GLUCOSE 86 72 74 82 87  BUN 42* 45* 50* 54* 23  CREATININE 10.71* 11.19* 11.87* 12.78* 7.10*  CALCIUM 8.8* 8.4* 8.6* 8.8* 9.1  MG  --  1.9  --   --   --   PHOS  --  6.0* 7.3* 7.5* 5.3*    GFR: Estimated Creatinine Clearance: 5 mL/min (A) (by C-G formula based on SCr of 7.1 mg/dL (H)).  Liver Function Tests: Recent Labs  Lab 05/20/23 2002 05/21/23 0525 05/21/23 1531 05/22/23 0752 05/23/23 0528  AST 17 11*  --   --   --   ALT 7 8  --   --   --   ALKPHOS 56 52  --   --   --   BILITOT 0.7 0.8  --   --   --   PROT 7.0 6.4*  --   --   --   ALBUMIN 3.3* 3.0* 3.3* 2.9* 3.0*    CBG: No results for input(s): "GLUCAP" in the last 168 hours.   No results found for this or any previous visit (from the past 240 hours).       Radiology Studies: No results found.       Scheduled Meds:  amLODipine  10 mg Oral Q1200   Chlorhexidine Gluconate Cloth  6 each Topical Q0600   Chlorhexidine Gluconate Cloth  6 each Topical Q0600   gabapentin  200 mg Oral QHS   heparin injection (subcutaneous)  5,000 Units Subcutaneous Q8H   hydrALAZINE  75 mg Oral Q8H   lipase/protease/amylase  48,000 Units Oral BID WC   pantoprazole (PROTONIX) IV  40 mg Intravenous Q12H   sevelamer carbonate  800 mg Oral TID WC   Continuous Infusions:  cefTRIAXone (ROCEPHIN)  IV Stopped (05/22/23 1230)   metronidazole 500 mg (05/23/23 0546)   promethazine (PHENERGAN) injection (IM or IVPB)       LOS: 3 days       Huey Bienenstock,  MD [facility]   To contact the attending provider between 7A-7P or the covering provider during after hours 7P-7A, please log into the web site [email or web address] and access using universal [place] password for that web site. If you do not have the password, please call the hospital operator.  05/23/2023, 1:52 PM

## 2023-05-23 NOTE — Evaluation (Signed)
Occupational Therapy Evaluation Patient Details Name: Ashley [name] MRN: [id number] DOB: 1950-09-01 Today's Date: 05/23/2023   History of Present Illness 72 yo female presenting [facility] on [facility]/20/23  via EMS from home after complaining of generalized abdominal pain for 2 weeks associated with diarrhea worse after eating. CT showed possible acute gastroenteritis. PMH including hypertension, pancreatitis, HLD, ESRD on hemodialysis, CVA, polysubstance abuse.   Clinical Impression   Pt admitted for above, eval was limited as pt declined need for ADLs and declined further ambulation beyond transferring to bed. Pt min A to pivot and needs UE support to steady balance, she has pain with bending at the waist so she was educated on figure four position for LBD/LBB. Pt would benefit from continued acute skilled OT services to address deficits and help transition to next level of care as able. Anticipate no post acute OT needed pending progress       If plan is discharge home, recommend the following: A little help with walking and/or transfers;Assistance with cooking/housework;Help with stairs or ramp for entrance    Functional Status Assessment  Patient has had a recent decline in their functional status and demonstrates the ability to make significant improvements in function in a reasonable and predictable amount of time.  Equipment Recommendations  Other (comment) (Rollator if pt does not already have one)    Recommendations for Other Services       Precautions / Restrictions Precautions Precautions: None Restrictions Weight Bearing Restrictions Per Provider Order: No      Mobility Bed Mobility   Bed Mobility: Sit to Supine       Sit to supine: Contact guard assist        Transfers Overall transfer level: Needs assistance Equipment used: 1 person hand held assist Transfers: Sit to/from Stand, Bed to chair/wheelchair/BSC Sit to Stand: Min assist Stand pivot transfers: Min  assist         General transfer comment: min A to steady      Balance Overall balance assessment: Needs assistance Sitting-balance support: No upper extremity supported, Feet supported Sitting balance-Leahy Scale: Good     Standing balance support: No upper extremity supported, During functional activity Standing balance-Leahy Scale: Fair                             ADL either performed or assessed with clinical judgement   ADL Overall ADL's : Needs assistance/impaired Eating/Feeding: Independent;Sitting   Grooming: Sitting;Set up   Upper Body Bathing: Sitting;Set up   Lower Body Bathing: Sitting/lateral leans;Minimal assistance Lower Body Bathing Details (indicate cue type and reason): pt having a hard time reaching over due to pain, educated pt on figure four position (she can maintain). Upper Body Dressing : Sitting;Set up   Lower Body Dressing: Minimal assistance;Sitting/lateral leans Lower Body Dressing Details (indicate cue type and reason): pt having a hard time reaching over due to pain, educated pt on figure four position (she can maintain). Toilet Transfer: Minimal Cabin crew Details (indicate cue type and reason): Pt declining gait in room, pivoted back to bed with min A to maintain balance Toileting- Clothing Manipulation and Hygiene: Sit to/from stand;Minimal assistance               Vision         Perception         Praxis         Pertinent Vitals/Pain Pain Assessment Pain Assessment: 0-10 Pain  Score: 6  Pain Location: abdomen Pain Descriptors / Indicators: Sore Pain Intervention(s): Monitored during session, Limited activity within patient's tolerance, Repositioned     Extremity/Trunk Assessment Upper Extremity Assessment Upper Extremity Assessment: Generalized weakness   Lower Extremity Assessment Lower Extremity Assessment: Generalized weakness   Cervical / Trunk Assessment Cervical / Trunk  Assessment: Kyphotic   Communication Communication Communication: No apparent difficulties Cueing Techniques: Verbal cues   Cognition Arousal: Alert Behavior During Therapy: WFL for tasks assessed/performed Overall Cognitive Status: Within Functional Limits for tasks assessed                                       General Comments  VSS on RA    Exercises     Shoulder Instructions      Home Living Family/patient expects to be discharged to:: Private residence Living Arrangements: Children Available Help at Discharge: Family;Available 24 hours/day Type of Home: Apartment Home Access: Stairs to enter [employer] of Steps: flight Entrance Stairs-Rails: Right Home Layout: One level     Bathroom Shower/Tub: Chief Strategy Officer: Standard     Home Equipment: Shower seat          Prior Functioning/Environment Prior Level of Function : Independent/Modified Independent             Mobility Comments: takes SCAT to HD ADLs Comments: Ind per report        OT Problem List: Decreased strength;Impaired balance (sitting and/or standing);Pain      OT Treatment/Interventions: Self-care/ADL training;Balance training;Therapeutic exercise;Therapeutic activities;Patient/family education    OT Goals(Current goals can be found in the care plan section) Acute Rehab OT Goals Patient Stated Goal: To get home OT Goal Formulation: With patient Time For Goal Achievement: 06/06/23 Potential to Achieve Goals: Good ADL Goals Pt Will Perform Grooming: with modified independence;standing Pt Will Perform Lower Body Bathing: with modified independence;sitting/lateral leans Pt Will Perform Lower Body Dressing: with modified independence;sitting/lateral leans;sit to/from stand Pt Will Transfer to Toilet: with modified independence;ambulating  OT Frequency: Min 1X/week    Co-evaluation              AM-PAC OT "6 Clicks" Daily Activity      Outcome Measure Help from another person eating meals?: None Help from another person taking care of personal grooming?: A Little Help from another person toileting, which includes using toliet, bedpan, or urinal?: A Little Help from another person bathing (including washing, rinsing, drying)?: A Little Help from another person to put on and taking off regular upper body clothing?: A Little Help from another person to put on and taking off regular lower body clothing?: A Little 6 Click Score: 19   End of Session Nurse Communication: Mobility status  Activity Tolerance: Patient limited by lethargy Patient left: in bed;with call bell/phone within reach;with bed alarm set  OT Visit Diagnosis: Unsteadiness on feet (R26.81);Other abnormalities of gait and mobility (R26.89);Pain Pain - Right/Left:  (abdominal)                Time: 1341-1350 OT Time Calculation (min): 9 min Charges:  OT General Charges $OT Visit: 1 Visit OT Evaluation $OT Eval Low Complexity: 1 Low  05/23/2023  AB, OTR/L  Acute Rehabilitation Services  Office: [phone]   Tristan Schroeder 05/23/2023, 3:31 PM

## 2023-05-23 NOTE — Evaluation (Signed)
Physical Therapy Evaluation Patient Details Name: Ashley [name] MRN: [id number] DOB: November 21, 1950 Today's Date: 05/23/2023  History of Present Illness  72 yo female presenting to [facility] on 05/20/23  via EMS from home after complaining of generalized abdominal pain for 2 weeks associated with diarrhea worse after eating. CT showed possible acute gastroenteritis. PMH including hypertension, pancreatitis, HLD, ESRD on hemodialysis, CVA, polysubstance abuse.  Clinical Impression  Pt admitted with above diagnosis. Pt from home with daughter, lives in second level apt, takes bus to HD. Reports fatigue with mobility. Pt mobilizing at min A level today, biggest limitation is abdominal pain and mild balance deficit. Recommend rollator with seat for home. Will follow acutely but do not anticipate her needing PT at d/c.  Pt currently with functional limitations due to the deficits listed below (see PT Problem List). Pt will benefit from acute skilled PT to increase their independence and safety with mobility to allow discharge.           If plan is discharge home, recommend the following: Help with stairs or ramp for entrance;Assist for transportation;Assistance with cooking/housework   Can travel by Nurse, mental health (4 wheels)  Recommendations for Other Services       Functional Status Assessment Patient has had a recent decline in their functional status and demonstrates the ability to make significant improvements in function in a reasonable and predictable amount of time.     Precautions / Restrictions Precautions Precautions: None Restrictions Weight Bearing Restrictions Per Provider Order: No      Mobility  Bed Mobility Overal bed mobility: Needs Assistance Bed Mobility: Supine to Sit     Supine to sit: Supervision     General bed mobility comments: supervision with increased time due to abdominal pain    Transfers Overall transfer level:  Needs assistance Equipment used: 1 person hand held assist Transfers: Sit to/from Stand Sit to Stand: Min assist           General transfer comment: min A to steady    Ambulation/Gait Ambulation/Gait assistance: Min assist Gait Distance (Feet): 15 Feet Assistive device: 1 person hand held assist Gait Pattern/deviations: Trunk flexed Gait velocity: decreased Gait velocity interpretation: <1.31 ft/sec, indicative of household ambulator   General Gait Details: pt with flexed posture due to abdominal pain. Min A needed for support and Wellsite geologist     Tilt Bed    Modified Rankin (Stroke Patients Only)       Balance Overall balance assessment: Needs assistance Sitting-balance support: No upper extremity supported, Feet supported Sitting balance-Leahy Scale: Good     Standing balance support: No upper extremity supported, During functional activity Standing balance-Leahy Scale: Fair Standing balance comment: limitations evident with perturbation                             Pertinent Vitals/Pain Pain Assessment Pain Assessment: 0-10 Pain Score: 7  Pain Location: abdomen Pain Descriptors / Indicators: Sore Pain Intervention(s): Limited activity within patient's tolerance, Monitored during session, Patient requesting pain meds-RN notified, RN gave pain meds during session    Home Living Family/patient expects to be discharged to:: Private residence Living Arrangements: Children Available Help at Discharge: Family;Available 24 hours/day Type of Home: Apartment Home Access: Stairs to enter Entrance Stairs-Rails: Right Entrance Stairs-Number of Steps: flight   Home  Layout: One level Home Equipment: Shower seat      Prior Function Prior Level of Function : Independent/Modified Independent             Mobility Comments: takes SCAT to HD       Extremity/Trunk Assessment   Upper Extremity  Assessment Upper Extremity Assessment: Generalized weakness    Lower Extremity Assessment Lower Extremity Assessment: Generalized weakness    Cervical / Trunk Assessment Cervical / Trunk Assessment: Kyphotic  Communication   Communication Communication: No apparent difficulties Cueing Techniques: Verbal cues  Cognition Arousal: Alert Behavior During Therapy: WFL for tasks assessed/performed Overall Cognitive Status: Within Functional Limits for tasks assessed                                          General Comments General comments (skin integrity, edema, etc.): VSS on RA. SPO2 remained in 90's    Exercises     Assessment/Plan    PT Assessment Patient needs continued PT services  PT Problem List Decreased strength;Decreased activity tolerance;Decreased balance;Decreased mobility;Decreased knowledge of use of DME;Pain       PT Treatment Interventions DME instruction;Gait training;Stair training;Functional mobility training;Therapeutic activities;Therapeutic exercise;Balance training;Patient/family education    PT Goals (Current goals can be found in the Care Plan section)  Acute Rehab PT Goals Patient Stated Goal: decreased abdominal pain PT Goal Formulation: With patient Time For Goal Achievement: 06/06/23 Potential to Achieve Goals: Good    Frequency Min 1X/week     Co-evaluation               AM-PAC PT "6 Clicks" Mobility  Outcome Measure Help needed turning from your back to your side while in a flat bed without using bedrails?: None Help needed moving from lying on your back to sitting on the side of a flat bed without using bedrails?: None Help needed moving to and from a bed to a chair (including a wheelchair)?: A Little Help needed standing up from a chair using your arms (e.g., wheelchair or bedside chair)?: A Little Help needed to walk in hospital room?: A Little Help needed climbing 3-5 steps with a railing? : A Lot 6 Click  Score: 19    End of Session   Activity Tolerance: Patient tolerated treatment well Patient left: in chair;with call bell/phone within reach Nurse Communication: Mobility status PT Visit Diagnosis: Muscle weakness (generalized) (M62.81);Difficulty in walking, not elsewhere classified (R26.2);Pain Pain - part of body:  (abdomen)    Time: 0865-7846 PT Time Calculation (min) (ACUTE ONLY): 21 min   Charges:   PT Evaluation $PT Eval Moderate Complexity: 1 Mod   PT General Charges $$ ACUTE PT VISIT: 1 Visit         Lyanne Co, PT  Acute Rehab Services Secure chat preferred Office [phone]   Lawana Chambers Shealynn Saulnier 05/23/2023, 1:47 PM

## 2023-05-23 NOTE — Progress Notes (Signed)
[facility] Progress Note   Subjective: Seen in room. C/O Abdominal pain but no diarrhea. Tolerated HD 12/13 without issues. Net UF 1.0 Liter  Objective Vitals:   05/22/23 1956 05/22/23 2352 05/23/23 0424 05/23/23 0846  BP: (!) 126/56 (!) 133/52 (!) 151/55 (!) 148/59  Pulse: 77   77  Resp:    16  Temp: 98.4 F (36.9 C) 98.3 F (36.8 C) 98.3 F (36.8 C) 98.7 F (37.1 C)  TempSrc: Oral Oral Oral Oral  SpO2: 94%   96%  Weight:      Height:       Physical Exam General: Pleasant older female in NAD Heart: S1,S2 RRR SR on monitor.  Lungs: CTAB  Abdomen: NABS, NT Extremities: No LE edema Dialysis Access: L AVF +T/B  Additional Objective Labs: Basic Metabolic Panel: Recent Labs  Lab 05/21/23 1531 05/22/23 0752 05/23/23 0528  NA 137 134* 133*  K 5.5* 5.6* 4.1  CL 95* 93* 92*  CO2 21* 22 27  GLUCOSE 74 82 87  BUN 50* 54* 23  CREATININE 11.87* 12.78* 7.10*  CALCIUM 8.6* 8.8* 9.1  PHOS 7.3* 7.5* 5.3*   Liver Function Tests: Recent Labs  Lab 05/20/23 2002 05/21/23 0525 05/21/23 1531 05/22/23 0752 05/23/23 0528  AST 17 11*  --   --   --   ALT 7 8  --   --   --   ALKPHOS 56 52  --   --   --   BILITOT 0.7 0.8  --   --   --   PROT 7.0 6.4*  --   --   --   ALBUMIN 3.3* 3.0* 3.3* 2.9* 3.0*   Recent Labs  Lab 05/20/23 2002 05/21/23 0525  LIPASE 65* 53*   CBC: Recent Labs  Lab 05/20/23 2002 05/21/23 0525 05/22/23 0551 05/23/23 0528  WBC 5.8 5.3 6.1 5.2  NEUTROABS  --  3.2  --   --   HGB 12.1 11.3* 11.1* 11.6*  HCT 39.2 36.5 34.8* 37.0  MCV 85.8 87.5 85.3 84.9  PLT 314 290 276 254   Blood Culture    Component Value Date/Time   SDES URINE, RANDOM 06/20/2017 2222   SPECREQUEST NONE 06/20/2017 2222   CULT MULTIPLE SPECIES PRESENT, SUGGEST RECOLLECTION (A) 06/20/2017 2222   REPTSTATUS 06/22/2017 FINAL 06/20/2017 2222    Cardiac Enzymes: No results for input(s): "CKTOTAL", "CKMB", "CKMBINDEX", "TROPONINI" in the last 168  hours. CBG: No results for input(s): "GLUCAP" in the last 168 hours. Iron Studies: No results for input(s): "IRON", "TIBC", "TRANSFERRIN", "FERRITIN" in the last 72 hours. @lablastinr3 @ Studies/Results: No results found. Medications:  cefTRIAXone (ROCEPHIN)  IV Stopped (05/22/23 1230)   metronidazole 500 mg (05/23/23 0546)   promethazine (PHENERGAN) injection (IM or IVPB)      amLODipine  10 mg Oral Q1200   Chlorhexidine Gluconate Cloth  6 each Topical Q0600   Chlorhexidine Gluconate Cloth  6 each Topical Q0600   gabapentin  200 mg Oral QHS   heparin injection (subcutaneous)  5,000 Units Subcutaneous Q8H   hydrALAZINE  75 mg Oral Q8H   lipase/protease/amylase  48,000 Units Oral BID WC   pantoprazole (PROTONIX) IV  40 mg Intravenous Q12H   sevelamer carbonate  800 mg Oral TID WC     OP HD: MWF [place]  3.5h   350/ 500  46.2kg   2/2.5 bath   AVF   Heparin none - last HD 12/09, post wt 46.3kg - getting to dry wt mostly  Assessment/ Plan: Abdominal pain/ diarrhea - CT showed possible acute gastroenteritis. Per pmd.  ESKD - on HD MWF. Next HD 05/25/2023 HTN - normal BP's Continue home meds.  Volume - CXR w/ vasc congestion on admission Volume appears stable. No edema on exam.Continue to optimize volume with HD.  Anemia of eskd - Hb 11, follow MBD ckd - CCa and phos are stable. Cont binders w/ meals.  Ellee Wawrzyniak H. Niambi Smoak NP-C 05/23/2023, 11:35 AM  [employer] [phone]

## 2023-05-23 NOTE — Plan of Care (Signed)
  Problem: Education: Goal: Knowledge of General Education information will improve Description: Including pain rating scale, medication(s)/side effects and non-pharmacologic comfort measures Outcome: Progressing   Problem: Health Behavior/Discharge Planning: Goal: Ability to manage health-related needs will improve Outcome: Progressing   Problem: Clinical Measurements: Goal: Diagnostic test results will improve Outcome: Progressing Goal: Respiratory complications will improve Outcome: Progressing   Problem: Activity: Goal: Risk for activity intolerance will decrease Outcome: Progressing   Problem: Nutrition: Goal: Adequate nutrition will be maintained Outcome: Progressing   Problem: Pain Management: Goal: General experience of comfort will improve Outcome: Progressing

## 2023-05-23 NOTE — Plan of Care (Signed)
  Problem: Education: Goal: Knowledge of General Education information will improve Description: Including pain rating scale, medication(s)/side effects and non-pharmacologic comfort measures Outcome: Progressing   Problem: Health Behavior/Discharge Planning: Goal: Ability to manage health-related needs will improve Outcome: Progressing   Problem: Clinical Measurements: Goal: Ability to maintain clinical measurements within normal limits will improve Outcome: Progressing Goal: Respiratory complications will improve Outcome: Progressing Goal: Cardiovascular complication will be avoided Outcome: Progressing   Problem: Activity: Goal: Risk for activity intolerance will decrease Outcome: Progressing   Problem: Elimination: Goal: Will not experience complications related to bowel motility Outcome: Progressing   Problem: Pain Management: Goal: General experience of comfort will improve Outcome: Progressing   Problem: Safety: Goal: Ability to remain free from injury will improve Outcome: Progressing   Problem: Skin Integrity: Goal: Risk for impaired skin integrity will decrease Outcome: Progressing   Problem: Nutrition Goal: Patient maintains adequate hydration Outcome: Progressing

## 2023-05-24 DIAGNOSIS — K529 Noninfective gastroenteritis and colitis, unspecified: Secondary | ICD-10-CM | POA: Diagnosis not present

## 2023-05-24 NOTE — Progress Notes (Signed)
PROGRESS NOTE    Ashley [name]  ZOX:[id number] DOB: [birth year] DOA: 05/20/2023 PCP: Raymon Mutton., FNP    Chief Complaint  Patient presents with   Abdominal Pain    Brief Narrative:    Ashley [name] is a 72 y.o. female with medical history significant of dyslipidemia, tobacco abuse, peptic ulcer disease with GI bleed, hepatitis C, hypertension, history of polysubstance abuse, prior pancreatitis, history of CVA.  Presented via EMS from home after complaining of generalized abdominal pain for 2 weeks associated with diarrhea worse after eating.  Typical dialysis days are MWF but missed this past Wednesday/date of admission due to pain.  Vital signs were stable in the ER and she was not hypoxic and CBG was 95.  Labs were stable with potassium of 5.1.  Creatinine elevated at 11.19 consistent with missed dialysis, lipase was elevated at 65.  White count normal.  CT abdomen and pelvis demonstrated patchy haziness in the posterior left lower lobe of the lung that could be asymmetric atelectasis versus small pneumonia.  There was also evidence of acute gastroenteritis.  There was body wall anasarca with mild mesenteric congestion.  O2 sats dipped into the 93 range and patient has been placed on nasal cannula oxygen.  [facility] hospitalist consulted to admit  Assessment & Plan:   Principal Problem:   Abdominal pain     Abdominal pain/diarrhea Gastroenteritis Chronic pancreatitis -Patient presents with abdominal pain, imaging significant for gastroenteritis -No further diarrhea, but she still having abdominal pain, but her oral intake has improved. -If no further diarrhea  -Continue with home dose Creon -Continue with as needed nausea and pain medications -Continue with IV Rocephin and Flagyl -Patient still complaining of abdominal pain, abdomen is tender to palpation, overall improving, I have discussed with her, if it still present by tomorrow likely will need to repeat imaging.    ESRD Hyperkalemia -Input greatly appreciated, she received dialysis today, she missed her dialysis on Wednesday -She is on long-term Carafate which I will discontinue.   Hypertension -Continue with home medications, continue with as needed hydralazine   Tobacco abuse -He was counseled, will keep on nicotine patch   Dyslipidemia Diet controlled   History of polysubstance abuse/tobacco abuse Check urine drug screen      DVT prophylaxis: Heparin Code Status: Full Family Communication: none at bedside Disposition:   Status is: Inpatient    Consultants:  renal   Subjective:  No further diarrhea, nausea and appetite improved, but still with significant abdominal pain Objective: Vitals:   05/23/23 2009 05/23/23 2316 05/24/23 0519 05/24/23 0857  BP: (!) 143/61  (!) 150/94 (!) 154/66  Pulse: 76 79 80 85  Resp: 13 13 14 11   Temp: 98.2 F (36.8 C)  98 F (36.7 C) 98.3 F (36.8 C)  TempSrc: Oral  Oral Oral  SpO2: 92% 92% 92%   Weight:      Height:       No intake or output data in the 24 hours ending 05/24/23 1556  Filed Weights   05/20/23 1937 05/22/23 0852 05/22/23 1322  Weight: 46.7 kg 47.1 kg 45.9 kg    Examination:  Awake Alert, Oriented X 3, No new F.N deficits, Normal affect Symmetrical Chest wall movement, Good air movement bilaterally, CTAB RRR,No Gallops,Rubs or new Murmurs, No Parasternal Heave +ve B.Sounds, Abd Soft, reports tenderness to palpation in epigastric area No Cyanosis, Clubbing or edema, No new Rash or bruise       Data Reviewed: I  have personally reviewed following labs and imaging studies  CBC: Recent Labs  Lab 05/20/23 2002 05/21/23 0525 05/22/23 0551 05/23/23 0528  WBC 5.8 5.3 6.1 5.2  NEUTROABS  --  3.2  --   --   HGB 12.1 11.3* 11.1* 11.6*  HCT 39.2 36.5 34.8* 37.0  MCV 85.8 87.5 85.3 84.9  PLT 314 290 276 254    Basic Metabolic Panel: Recent Labs  Lab 05/20/23 2002 05/21/23 0525 05/21/23 1531 05/22/23 0752  05/23/23 0528  NA 137 139 137 134* 133*  K 4.9 5.1 5.5* 5.6* 4.1  CL 95* 100 95* 93* 92*  CO2 24 23 21* 22 27  GLUCOSE 86 72 74 82 87  BUN 42* 45* 50* 54* 23  CREATININE 10.71* 11.19* 11.87* 12.78* 7.10*  CALCIUM 8.8* 8.4* 8.6* 8.8* 9.1  MG  --  1.9  --   --   --   PHOS  --  6.0* 7.3* 7.5* 5.3*    GFR: Estimated Creatinine Clearance: 5 mL/min (A) (by C-G formula based on SCr of 7.1 mg/dL (H)).  Liver Function Tests: Recent Labs  Lab 05/20/23 2002 05/21/23 0525 05/21/23 1531 05/22/23 0752 05/23/23 0528  AST 17 11*  --   --   --   ALT 7 8  --   --   --   ALKPHOS 56 52  --   --   --   BILITOT 0.7 0.8  --   --   --   PROT 7.0 6.4*  --   --   --   ALBUMIN 3.3* 3.0* 3.3* 2.9* 3.0*    CBG: No results for input(s): "GLUCAP" in the last 168 hours.   No results found for this or any previous visit (from the past 240 hours).       Radiology Studies: No results found.       Scheduled Meds:  amLODipine  10 mg Oral Q1200   Chlorhexidine Gluconate Cloth  6 each Topical Q0600   Chlorhexidine Gluconate Cloth  6 each Topical Q0600   gabapentin  200 mg Oral QHS   heparin injection (subcutaneous)  5,000 Units Subcutaneous Q8H   hydrALAZINE  75 mg Oral Q8H   lipase/protease/amylase  48,000 Units Oral BID WC   pantoprazole  40 mg Oral BID   sevelamer carbonate  800 mg Oral TID WC   Continuous Infusions:  cefTRIAXone (ROCEPHIN)  IV 2 g (05/23/23 1937)   metronidazole 500 mg (05/24/23 0610)   promethazine (PHENERGAN) injection (IM or IVPB)       LOS: 4 days       Huey Bienenstock, MD [facility]   To contact the attending provider between 7A-7P or the covering provider during after hours 7P-7A, please log into the web site [email or web address] and access using universal [place] password for that web site. If you do not have the password, please call the hospital operator.  05/24/2023, 3:56 PM

## 2023-05-24 NOTE — Progress Notes (Signed)
Mobility Specialist Progress Note;   05/24/23 1125  Mobility  Activity Transferred from bed to chair  Level of Assistance Minimal assist, patient does 75% or more  Assistive Device Other (Comment) (HHA)  Distance Ambulated (ft) 3 ft  Activity Response Tolerated well  Mobility Referral Yes  Mobility visit 1 Mobility  Mobility Specialist Start Time (ACUTE ONLY) 1125  Mobility Specialist Stop Time (ACUTE ONLY) 1135  Mobility Specialist Time Calculation (min) (ACUTE ONLY) 10 min   Pt agreeable to mobility. Required MinA for transfer from bed to chair via HHA. C/o abd pain during session. VSS throughout transfer. Pt left in chair with all needs met.   Caesar Bookman Mobility Specialist Please contact via SecureChat or [employer] [phone]

## 2023-05-24 NOTE — Plan of Care (Signed)
  Problem: Education: Goal: Knowledge of General Education information will improve Description: Including pain rating scale, medication(s)/side effects and non-pharmacologic comfort measures Outcome: Progressing   Problem: Health Behavior/Discharge Planning: Goal: Ability to manage health-related needs will improve Outcome: Progressing   Problem: Clinical Measurements: Goal: Diagnostic test results will improve Outcome: Progressing   Problem: Coping: Goal: Level of anxiety will decrease Outcome: Progressing   Problem: Pain Management: Goal: General experience of comfort will improve Outcome: Progressing

## 2023-05-24 NOTE — Progress Notes (Addendum)
[facility] Progress Note   Subjective: Seen in room. Still having abdominal pain.     Objective Vitals:   05/23/23 2009 05/23/23 2316 05/24/23 0519 05/24/23 0857  BP: (!) 143/61  (!) 150/94 (!) 154/66  Pulse: 76 79 80 85  Resp: 13 13 14 11   Temp: 98.2 F (36.8 C)  98 F (36.7 C) 98.3 F (36.8 C)  TempSrc: Oral  Oral Oral  SpO2: 92% 92% 92%   Weight:      Height:       Physical Exam General: Pleasant older female in NAD Heart: S1,S2 RRR SR on monitor.  Lungs: CTAB  Abdomen: NABS, NT Extremities: No LE edema Dialysis Access: L AVF +T/B   Additional Objective Labs: Basic Metabolic Panel: Recent Labs  Lab 05/21/23 1531 05/22/23 0752 05/23/23 0528  NA 137 134* 133*  K 5.5* 5.6* 4.1  CL 95* 93* 92*  CO2 21* 22 27  GLUCOSE 74 82 87  BUN 50* 54* 23  CREATININE 11.87* 12.78* 7.10*  CALCIUM 8.6* 8.8* 9.1  PHOS 7.3* 7.5* 5.3*   Liver Function Tests: Recent Labs  Lab 05/20/23 2002 05/21/23 0525 05/21/23 1531 05/22/23 0752 05/23/23 0528  AST 17 11*  --   --   --   ALT 7 8  --   --   --   ALKPHOS 56 52  --   --   --   BILITOT 0.7 0.8  --   --   --   PROT 7.0 6.4*  --   --   --   ALBUMIN 3.3* 3.0* 3.3* 2.9* 3.0*   Recent Labs  Lab 05/20/23 2002 05/21/23 0525  LIPASE 65* 53*   CBC: Recent Labs  Lab 05/20/23 2002 05/21/23 0525 05/22/23 0551 05/23/23 0528  WBC 5.8 5.3 6.1 5.2  NEUTROABS  --  3.2  --   --   HGB 12.1 11.3* 11.1* 11.6*  HCT 39.2 36.5 34.8* 37.0  MCV 85.8 87.5 85.3 84.9  PLT 314 290 276 254   Blood Culture    Component Value Date/Time   SDES URINE, RANDOM 06/20/2017 2222   SPECREQUEST NONE 06/20/2017 2222   CULT MULTIPLE SPECIES PRESENT, SUGGEST RECOLLECTION (A) 06/20/2017 2222   REPTSTATUS 06/22/2017 FINAL 06/20/2017 2222    Cardiac Enzymes: No results for input(s): "CKTOTAL", "CKMB", "CKMBINDEX", "TROPONINI" in the last 168 hours. CBG: No results for input(s): "GLUCAP" in the last 168 hours. Iron Studies: No  results for input(s): "IRON", "TIBC", "TRANSFERRIN", "FERRITIN" in the last 72 hours. @lablastinr3 @ Studies/Results: No results found. Medications:  cefTRIAXone (ROCEPHIN)  IV 2 g (05/23/23 1937)   metronidazole 500 mg (05/24/23 0610)   promethazine (PHENERGAN) injection (IM or IVPB)      amLODipine  10 mg Oral Q1200   Chlorhexidine Gluconate Cloth  6 each Topical Q0600   Chlorhexidine Gluconate Cloth  6 each Topical Q0600   gabapentin  200 mg Oral QHS   heparin injection (subcutaneous)  5,000 Units Subcutaneous Q8H   hydrALAZINE  75 mg Oral Q8H   lipase/protease/amylase  48,000 Units Oral BID WC   pantoprazole  40 mg Oral BID   sevelamer carbonate  800 mg Oral TID WC     OP HD: MWF [facility]  3.5h   350/ 500  46.2kg   2/2.5 bath   AVF   Heparin none - last HD 12/09, post wt 46.3kg - getting to dry wt mostly     Assessment/ Plan: Abdominal pain/ diarrhea - CT showed  possible acute gastroenteritis. Per pmd.  ESKD - on HD MWF. Next HD 05/25/2023 HTN - normal BP's Continue home meds.  Volume - CXR w/ vasc congestion on admission Volume appears stable. No edema on exam.Continue to optimize volume with HD.  Anemia of eskd - Hb 11, follow MBD ckd - CCa and phos are stable. Cont binders w/ meals.  Amantha Sklar H. [place] Dost NP-C 05/24/2023, 10:41 AM  [employer] [phone]

## 2023-05-24 NOTE — Plan of Care (Signed)

## 2023-05-25 DIAGNOSIS — R1010 Upper abdominal pain, unspecified: Secondary | ICD-10-CM | POA: Diagnosis not present

## 2023-05-25 LAB — CBC
HCT: 36.9 % (ref 36.0–46.0)
Hemoglobin: 11.7 g/dL — ABNORMAL LOW (ref 12.0–15.0)
MCH: 27 pg (ref 26.0–34.0)
MCHC: 31.7 g/dL (ref 30.0–36.0)
MCV: 85 fL (ref 80.0–100.0)
Platelets: 247 10*3/uL (ref 150–400)
RBC: 4.34 MIL/uL (ref 3.87–5.11)
RDW: 18.1 % — ABNORMAL HIGH (ref 11.5–15.5)
WBC: 6.1 10*3/uL (ref 4.0–10.5)
nRBC: 0 % (ref 0.0–0.2)

## 2023-05-25 LAB — BASIC METABOLIC PANEL
Anion gap: 14 (ref 5–15)
BUN: 42 mg/dL — ABNORMAL HIGH (ref 8–23)
CO2: 22 mmol/L (ref 22–32)
Calcium: 9.2 mg/dL (ref 8.9–10.3)
Chloride: 97 mmol/L — ABNORMAL LOW (ref 98–111)
Creatinine, Ser: 11.68 mg/dL — ABNORMAL HIGH (ref 0.44–1.00)
GFR, Estimated: 3 mL/min — ABNORMAL LOW (ref >60)
Glucose, Bld: 86 mg/dL (ref 70–99)
Potassium: 4.5 mmol/L (ref 3.5–5.1)
Sodium: 133 mmol/L — ABNORMAL LOW (ref 135–145)

## 2023-05-25 MED ORDER — LIDOCAINE HCL (PF) 1 % IJ SOLN: 5.0000 mL | INTRAMUSCULAR | Status: DC | PRN

## 2023-05-25 MED ORDER — HEPARIN SODIUM (PORCINE) 1000 UNIT/ML DIALYSIS: 1000.0000 [IU] | INTRAMUSCULAR | Status: DC | PRN

## 2023-05-25 MED ORDER — LIDOCAINE-PRILOCAINE 2.5-2.5 % EX CREA: 1.0000 | TOPICAL_CREAM | CUTANEOUS | Status: DC | PRN

## 2023-05-25 MED ORDER — PROSOURCE PLUS PO LIQD
30.0000 mL | Freq: Two times a day (BID) | ORAL | Status: DC
  Administered 2023-05-25 – 2023-05-26 (×3): 30 mL via ORAL
  Filled 2023-05-25 (×4): qty 30

## 2023-05-25 MED ORDER — OXYCODONE HCL 5 MG PO TABS
5.0000 mg | ORAL_TABLET | ORAL | Status: DC | PRN
  Administered 2023-05-25 – 2023-05-28 (×12): 5 mg via ORAL
  Filled 2023-05-25 (×12): qty 1

## 2023-05-25 MED ORDER — PENTAFLUOROPROP-TETRAFLUOROETH EX AERO: 1.0000 | INHALATION_SPRAY | CUTANEOUS | Status: DC | PRN

## 2023-05-25 MED ORDER — IOHEXOL 9 MG/ML PO SOLN: 500.0000 mL | ORAL | Status: AC

## 2023-05-25 NOTE — Progress Notes (Signed)
Received patient in bed to unit.  Alert and oriented.  Informed consent signed and in chart.   TX duration:3.5  Patient tolerated well.  Transported back to the room  Alert, without acute distress.  Hand-off given to patient's nurse.   Access used: left AVF Access issues: none  Total UF removed: 2L Medication(s) given: ROCEPHIN, DILUADID   05/25/23 1803  Vitals  Temp 97.8 F (36.6 C)  Temp Source Oral  BP (!) 183/78  MAP (mmHg) 105  BP Location Right Wrist  BP Method Automatic  Patient Position (if appropriate) Lying  Pulse Rate 99  Pulse Rate Source Monitor  ECG Heart Rate (!) 102  Resp 13  Oxygen Therapy  SpO2 100 %  O2 Device Room Air  During Treatment Monitoring  Blood Flow Rate (mL/min) 399 mL/min  Arterial Pressure (mmHg) -9.09 mmHg  Venous Pressure (mmHg) 271.1 mmHg  TMP (mmHg) 2.63 mmHg  Ultrafiltration Rate (mL/min) 999 mL/min  Dialysate Flow Rate (mL/min) 299 ml/min  Duration of HD Treatment -hour(s) 3.47 hour(s)  Cumulative Fluid Removed (mL) per Treatment  1972.91  HD Safety Checks Performed Yes  Intra-Hemodialysis Comments Tx completed;Tolerated well  Dialysis Fluid Bolus Normal Saline  Bolus Amount (mL) 300 mL      Billijo Dilling S Octivia Canion, RN [facility]

## 2023-05-25 NOTE — Progress Notes (Addendum)
Returned from Hemodialysis without difficulty via stretcher with complaints of pain.  Medications to be given when name transferred back to this unit.  Speech clear.  No distress noted.

## 2023-05-25 NOTE — Progress Notes (Signed)
Mobility Specialist Progress Note:    05/25/23 0909  Mobility  Activity Transferred from bed to chair  Level of Assistance Minimal assist, patient does 75% or more  Assistive Device Other (Comment) (HHA)  Distance Ambulated (ft) 3 ft  Activity Response Tolerated well  Mobility Referral Yes  Mobility visit 1 Mobility  Mobility Specialist Start Time (ACUTE ONLY) 0900  Mobility Specialist Stop Time (ACUTE ONLY) 0909  Mobility Specialist Time Calculation (min) (ACUTE ONLY) 9 min   Pt received in bed, agreeable to transfer B>C. Required MinA via HHA for safety. Tolerated well, asx throughout. Left with all needs met, call bell in reach.    Feliciana Rossetti Mobility Specialist Please contact via Special educational needs teacher or  Rehab office at [phone]

## 2023-05-25 NOTE — Progress Notes (Signed)
PROGRESS NOTE    Ashley [name]  UJW:[id number] DOB: [birth year] DOA: 05/20/2023 PCP: Raymon Mutton., FNP    Chief Complaint  Patient presents with   Abdominal Pain    Brief Narrative:    Ashley [name] is a 72 y.o. female with medical history significant of dyslipidemia, tobacco abuse, peptic ulcer disease with GI bleed, hepatitis C, hypertension, history of polysubstance abuse, prior pancreatitis, history of CVA.  Presented via EMS from home after complaining of generalized abdominal pain for 2 weeks associated with diarrhea worse after eating.  Typical dialysis days are MWF but missed this past Wednesday/date of admission due to pain.  Vital signs were stable in the ER and she was not hypoxic and CBG was 95.  Labs were stable with potassium of 5.1.  Creatinine elevated at 11.19 consistent with missed dialysis, lipase was elevated at 65.  White count normal.  CT abdomen and pelvis demonstrated patchy haziness in the posterior left lower lobe of the lung that could be asymmetric atelectasis versus small pneumonia.  There was also evidence of acute gastroenteritis.  There was body wall anasarca with mild mesenteric congestion.  O2 sats dipped into the 93 range and patient has been placed on nasal cannula oxygen.  [facility] hospitalist consulted to admit  Assessment & Plan:   Principal Problem:   Abdominal pain     Abdominal pain/diarrhea Gastroenteritis Chronic pancreatitis -Patient presents with abdominal pain, imaging significant for gastroenteritis -No further diarrhea, but she still having abdominal pain, but her oral intake has improved. -no further diarrhea  -Continue with home dose Creon -Continue with as needed nausea and pain medications -Continue with IV Rocephin and Flagyl -Patient still complaining of abdominal pain, all is improving, but still significant, receiving IV pain meds around-the-clock, will proceed with CT abdomen pelvis IV contrast for further evaluation, low  concern for ischemic colitis given pain is not related to pain, actually her appetite and oral intake has improved.      ESRD Hyperkalemia -Input greatly appreciated, she received dialysis today, she missed her dialysis on Wednesday -She is on long-term Carafate which I will discontinue.   Hypertension -Continue with home medications, continue with as needed hydralazine   Tobacco abuse -He was counseled, will keep on nicotine patch   Dyslipidemia Diet controlled     DVT prophylaxis: Heparin Code Status: Full Family Communication: none at bedside Disposition:   Status is: Inpatient    Consultants:  renal   Subjective:  No further diarrhea, nausea and appetite improved, but still with significant abdominal pain Objective: Vitals:   05/25/23 1412 05/25/23 1426 05/25/23 1500 05/25/23 1530  BP: (!) 153/67 126/66 (!) 164/75 (!) 164/73  Pulse: 77 76 79 79  Resp: 12 13 11 15   Temp: 98.5 F (36.9 C)     TempSrc: Oral     SpO2: 93% 92% 91% 92%  Weight: 49.9 kg     Height:       No intake or output data in the 24 hours ending 05/25/23 1542  Filed Weights   05/22/23 1322 05/25/23 0455 05/25/23 1412  Weight: 45.9 kg 56.3 kg 49.9 kg    Examination:  Awake Alert, Oriented X 3, frail Symmetrical Chest wall movement, Good air movement bilaterally, CTAB RRR,No Gallops,Rubs or new Murmurs, No Parasternal Heave +ve B.Sounds, Abd Soft, diminished to palpation, No rebound - guarding or rigidity. No Cyanosis, Clubbing or edema, No new Rash or bruise       Data Reviewed: I  have personally reviewed following labs and imaging studies  CBC: Recent Labs  Lab 05/20/23 2002 05/21/23 0525 05/22/23 0551 05/23/23 0528 05/25/23 0506  WBC 5.8 5.3 6.1 5.2 6.1  NEUTROABS  --  3.2  --   --   --   HGB 12.1 11.3* 11.1* 11.6* 11.7*  HCT 39.2 36.5 34.8* 37.0 36.9  MCV 85.8 87.5 85.3 84.9 85.0  PLT 314 290 276 254 247    Basic Metabolic Panel: Recent Labs  Lab  05/21/23 0525 05/21/23 1531 05/22/23 0752 05/23/23 0528 05/25/23 0506  NA 139 137 134* 133* 133*  K 5.1 5.5* 5.6* 4.1 4.5  CL 100 95* 93* 92* 97*  CO2 23 21* 22 27 22   GLUCOSE 72 74 82 87 86  BUN 45* 50* 54* 23 42*  CREATININE 11.19* 11.87* 12.78* 7.10* 11.68*  CALCIUM 8.4* 8.6* 8.8* 9.1 9.2  MG 1.9  --   --   --   --   PHOS 6.0* 7.3* 7.5* 5.3*  --     GFR: Estimated Creatinine Clearance: 3.3 mL/min (A) (by C-G formula based on SCr of 11.68 mg/dL (H)).  Liver Function Tests: Recent Labs  Lab 05/20/23 2002 05/21/23 0525 05/21/23 1531 05/22/23 0752 05/23/23 0528  AST 17 11*  --   --   --   ALT 7 8  --   --   --   ALKPHOS 56 52  --   --   --   BILITOT 0.7 0.8  --   --   --   PROT 7.0 6.4*  --   --   --   ALBUMIN 3.3* 3.0* 3.3* 2.9* 3.0*    CBG: No results for input(s): "GLUCAP" in the last 168 hours.   No results found for this or any previous visit (from the past 240 hours).       Radiology Studies: No results found.       Scheduled Meds:  (feeding supplement) PROSource Plus  30 mL Oral BID BM   amLODipine  10 mg Oral Q1200   Chlorhexidine Gluconate Cloth  6 each Topical Q0600   Chlorhexidine Gluconate Cloth  6 each Topical Q0600   gabapentin  200 mg Oral QHS   heparin injection (subcutaneous)  5,000 Units Subcutaneous Q8H   hydrALAZINE  75 mg Oral Q8H   lipase/protease/amylase  48,000 Units Oral BID WC   pantoprazole  40 mg Oral BID   sevelamer carbonate  800 mg Oral TID WC   Continuous Infusions:  cefTRIAXone (ROCEPHIN)  IV 2 g (05/24/23 1721)   metronidazole 500 mg (05/25/23 0513)   promethazine (PHENERGAN) injection (IM or IVPB)       LOS: 5 days       Huey Bienenstock, MD [facility]   To contact the attending provider between 7A-7P or the covering provider during after hours 7P-7A, please log into the web site [email or web address] and access using universal [place] password for that web site. If you do not have the password,  please call the hospital operator.  05/25/2023, 3:42 PM

## 2023-05-25 NOTE — Progress Notes (Signed)
[facility] Progress Note   Subjective:   Seen in room - feeling better overall, abd pain is less and no diarrhea. No CP/dyspnea. For HD today.  Objective Vitals:   05/25/23 0000 05/25/23 0455 05/25/23 0525 05/25/23 0757  BP:   (!) 150/89 (!) 150/60  Pulse:   80 79  Resp: 11  13 16   Temp:   98.2 F (36.8 C) 98.3 F (36.8 C)  TempSrc:   Oral Oral  SpO2:   90% 90%  Weight:  56.3 kg    Height:       Physical Exam General: Well appearing, NAD. Room air. Heart: RRR; no murmur Lungs: CTAB Abdomen: soft Extremities: no LE edema Dialysis Access: LUE AVF  Additional Objective Labs: Basic Metabolic Panel: Recent Labs  Lab 05/21/23 1531 05/22/23 0752 05/23/23 0528 05/25/23 0506  NA 137 134* 133* 133*  K 5.5* 5.6* 4.1 4.5  CL 95* 93* 92* 97*  CO2 21* 22 27 22   GLUCOSE 74 82 87 86  BUN 50* 54* 23 42*  CREATININE 11.87* 12.78* 7.10* 11.68*  CALCIUM 8.6* 8.8* 9.1 9.2  PHOS 7.3* 7.5* 5.3*  --    Liver Function Tests: Recent Labs  Lab 05/20/23 2002 05/21/23 0525 05/21/23 1531 05/22/23 0752 05/23/23 0528  AST 17 11*  --   --   --   ALT 7 8  --   --   --   ALKPHOS 56 52  --   --   --   BILITOT 0.7 0.8  --   --   --   PROT 7.0 6.4*  --   --   --   ALBUMIN 3.3* 3.0* 3.3* 2.9* 3.0*   Recent Labs  Lab 05/20/23 2002 05/21/23 0525  LIPASE 65* 53*   CBC: Recent Labs  Lab 05/20/23 2002 05/21/23 0525 05/22/23 0551 05/23/23 0528 05/25/23 0506  WBC 5.8 5.3 6.1 5.2 6.1  NEUTROABS  --  3.2  --   --   --   HGB 12.1 11.3* 11.1* 11.6* 11.7*  HCT 39.2 36.5 34.8* 37.0 36.9  MCV 85.8 87.5 85.3 84.9 85.0  PLT 314 290 276 254 247   Medications:  cefTRIAXone (ROCEPHIN)  IV 2 g (05/24/23 1721)   metronidazole 500 mg (05/25/23 0513)   promethazine (PHENERGAN) injection (IM or IVPB)      amLODipine  10 mg Oral Q1200   Chlorhexidine Gluconate Cloth  6 each Topical Q0600   Chlorhexidine Gluconate Cloth  6 each Topical Q0600   gabapentin  200 mg Oral QHS    heparin injection (subcutaneous)  5,000 Units Subcutaneous Q8H   hydrALAZINE  75 mg Oral Q8H   lipase/protease/amylase  48,000 Units Oral BID WC   pantoprazole  40 mg Oral BID   sevelamer carbonate  800 mg Oral TID WC    Dialysis Orders: MWF [facility]  3.5h   350/ 500  46.2kg   2/2.5 bath   AVF   Heparin none - last HD 12/09, post wt 46.3kg - getting to dry wt mostly  Assessment/Plan: Abdominal pain/diarrhea: Possible enteritis per CT. She does have chronic pancreatitis. Diarrhea improved and abd pain is less. Finishing course of Ceftriaxone + Flagyl, on home Creon. ESRD: Continue HD on usual MWF - for HD today. HTN - BP up slightly, UF as tolerated. Weight way up, ?accuracy. Volume - CXR w/ vasc congestion on admission, UF as tolerated. Anemia of ESRD - Hb 11.7, no ESA needed. Secondary HPTH: CorrCa slightly high -  no VDRA here, Phos good - continue sevelamer. Nutrition: Alb low, ok for protein supplements.  Ashley Hoyle, PA-C 05/25/2023, 10:48 AM  [employer]

## 2023-05-25 NOTE — Progress Notes (Signed)
To HD without difficulty via bed.  No distress noted

## 2023-05-25 NOTE — Plan of Care (Signed)
  Problem: Education: Goal: Knowledge of General Education information will improve Description: Including pain rating scale, medication(s)/side effects and non-pharmacologic comfort measures Outcome: Progressing   Problem: Clinical Measurements: Goal: Will remain free from infection Outcome: Progressing Goal: Respiratory complications will improve Outcome: Progressing   

## 2023-05-26 ENCOUNTER — Inpatient Hospital Stay (HOSPITAL_COMMUNITY): Payer: 59

## 2023-05-26 DIAGNOSIS — R1010 Upper abdominal pain, unspecified: Secondary | ICD-10-CM | POA: Diagnosis not present

## 2023-05-26 LAB — HEPATIC FUNCTION PANEL
ALT: 15 U/L (ref 0–44)
AST: 36 U/L (ref 15–41)
Albumin: 2.7 g/dL — ABNORMAL LOW (ref 3.5–5.0)
Alkaline Phosphatase: 53 U/L (ref 38–126)
Bilirubin, Direct: <0.1 mg/dL (ref 0.0–0.2)
Total Bilirubin: 0.6 mg/dL (ref <1.2)
Total Protein: 6.2 g/dL — ABNORMAL LOW (ref 6.5–8.1)

## 2023-05-26 LAB — LIPASE, BLOOD: Lipase: 58 U/L — ABNORMAL HIGH (ref 11–51)

## 2023-05-26 MED ORDER — IOHEXOL 9 MG/ML PO SOLN
1000.0000 mL | ORAL | Status: AC
Start: 2023-05-26 — End: 2023-05-26
  Administered 2023-05-26 (×2): 1000 mL via ORAL

## 2023-05-26 MED ORDER — IOHEXOL 350 MG/ML SOLN
75.0000 mL | Freq: Once | INTRAVENOUS | Status: AC | PRN
  Administered 2023-05-26: 75 mL via INTRAVENOUS

## 2023-05-26 NOTE — Progress Notes (Signed)
Physical Therapy Treatment Patient Details Name: Ashley [name] MRN: [id number] DOB: 1950/07/23 Today's Date: 05/26/2023   History of Present Illness 72 yo female presenting to [facility] on 05/20/23  via EMS from home after complaining of generalized abdominal pain for 2 weeks associated with diarrhea worse after eating. CT showed possible acute gastroenteritis. PMH including hypertension, pancreatitis, HLD, ESRD on hemodialysis, CVA, polysubstance abuse.    PT Comments  Pt received in supine and requesting to use the bathroom. Pt demonstrates some impulsivity during session requiring cues for safety. Pt instructed in hand placement for STS transfer, however pt continues to place both hands on RW and rely on momentum for power up. Pt able to ambulate to bathroom with CGA and complete pericare without assist. Pt declines further mobility due to increased fatigue. Pt continues to benefit from PT services to progress toward functional mobility goals.    If plan is discharge home, recommend the following: Help with stairs or ramp for entrance;Assist for transportation;Assistance with cooking/housework   Can travel by Pension scheme manager (4 wheels)    Recommendations for Other Services       Precautions / Restrictions Precautions Precautions: None Restrictions Weight Bearing Restrictions Per Provider Order: No     Mobility  Bed Mobility Overal bed mobility: Needs Assistance Bed Mobility: Sit to Supine, Supine to Sit     Supine to sit: Supervision, HOB elevated Sit to supine: Supervision   General bed mobility comments: increased time    Transfers Overall transfer level: Needs assistance Equipment used: Rolling walker (2 wheels) Transfers: Sit to/from Stand Sit to Stand: Min assist           General transfer comment: From low EOB after unsuccessful attempt with pt pulling up on RW and feet sliding forward. Cues for hand placement, however  pt continuing to place B hands on RW.    Ambulation/Gait Ambulation/Gait assistance: Contact guard assist Gait Distance (Feet): 10 Feet (x2) Assistive device: Rolling walker (2 wheels) Gait Pattern/deviations: Trunk flexed, Step-through pattern, Decreased stride length Gait velocity: decreased     General Gait Details: Pt demonstrates good stability with RW support. Cues and CGA for safety       Balance Overall balance assessment: Needs assistance Sitting-balance support: No upper extremity supported, Feet supported Sitting balance-Leahy Scale: Good Sitting balance - Comments: sitting EOB   Standing balance support: No upper extremity supported, During functional activity Standing balance-Leahy Scale: Fair Standing balance comment: with RW support                            Cognition Arousal: Alert Behavior During Therapy: WFL for tasks assessed/performed Overall Cognitive Status: Within Functional Limits for tasks assessed                                          Exercises      General Comments        Pertinent Vitals/Pain Pain Assessment Pain Assessment: Faces Faces Pain Scale: Hurts little more Pain Location: abdomen Pain Descriptors / Indicators: Sore Pain Intervention(s): Limited activity within patient's tolerance, Monitored during session     PT Goals (current goals can now be found in the care plan section) Acute Rehab PT Goals Patient Stated Goal: decreased abdominal pain PT Goal Formulation: With patient Time For  Goal Achievement: 06/06/23 Progress towards PT goals: Progressing toward goals    Frequency    Min 1X/week       AM-PAC PT "6 Clicks" Mobility   Outcome Measure  Help needed turning from your back to your side while in a flat bed without using bedrails?: None Help needed moving from lying on your back to sitting on the side of a flat bed without using bedrails?: None Help needed moving to and from a  bed to a chair (including a wheelchair)?: A Little Help needed standing up from a chair using your arms (e.g., wheelchair or bedside chair)?: A Little Help needed to walk in hospital room?: A Little Help needed climbing 3-5 steps with a railing? : A Lot 6 Click Score: 19    End of Session Equipment Utilized During Treatment: Gait belt Activity Tolerance: Patient limited by fatigue Patient left: with call bell/phone within reach;in bed;with bed alarm set Nurse Communication: Mobility status PT Visit Diagnosis: Muscle weakness (generalized) (M62.81);Difficulty in walking, not elsewhere classified (R26.2);Pain     Time: 4098-1191 PT Time Calculation (min) (ACUTE ONLY): 17 min  Charges:    $Gait Training: 8-22 mins PT General Charges $$ ACUTE PT VISIT: 1 Visit                     Johny Shock, PTA Acute Rehabilitation Services Secure Chat Preferred  [phone]    Johny Shock 05/26/2023, 3:08 PM

## 2023-05-26 NOTE — Progress Notes (Signed)
PROGRESS NOTE    Ashley [name]  RUE:[id number] DOB: 10-Jan-1951 DOA: 05/20/2023 PCP: Raymon Mutton., FNP    Chief Complaint  Patient presents with   Abdominal Pain    Brief Narrative:    Ashley [name] is a 72 y.o. female with medical history significant of dyslipidemia, tobacco abuse, peptic ulcer disease with GI bleed, hepatitis C, hypertension, history of polysubstance abuse, prior pancreatitis, history of CVA.  Presented via EMS from home after complaining of generalized abdominal pain for 2 weeks associated with diarrhea worse after eating.  Typical dialysis days are MWF but missed this past Wednesday/date of admission due to pain.  Vital signs were stable in the ER and she was not hypoxic and CBG was 95.  Labs were stable with potassium of 5.1.  Creatinine elevated at 11.19 consistent with missed dialysis, lipase was elevated at 65.  White count normal.  CT abdomen and pelvis demonstrated patchy haziness in the posterior left lower lobe of the lung that could be asymmetric atelectasis versus small pneumonia.  There was also evidence of acute gastroenteritis.  There was body wall anasarca with mild mesenteric congestion.  O2 sats dipped into the 93 range and patient has been placed on nasal cannula oxygen.  Patient nausea, vomiting and diarrhea has improved, but she continues to have significant abdominal pain, so CT abdomen pelvis with IV contrast has been obtained 12/17, which was significant for bile duct dilation  Assessment & Plan:   Principal Problem:   Abdominal pain     Abdominal pain/diarrhea Gastroenteritis Chronic pancreatitis -Patient presents with abdominal pain, imaging significant for gastroenteritis -No further diarrhea, but she still having abdominal pain, but her oral intake has improved. -no further diarrhea  -Continue with home dose Creon -Continue with as needed nausea and pain medications -Treated with 5 days of IV Rocephin and Flagyl -Patient still  complaining of difficult abdominal pain, so repeat CT abdomen pelvis with IV contrast has been obtained this morning, which is significant for diffuse dilation of the pancreatic duct max at 7 mm in the head-neck area of the pancreas, I will recheck LFTs, I will check lipase level as well, he did request GI input as well.  .   ESRD Hyperkalemia -Input greatly appreciated, she received dialysis today, she missed her dialysis on Wednesday -She is on long-term Carafate which I I did discontinue given she is ESRD patient   Hypertension -Continue with home medications, continue with as needed hydralazine   Tobacco abuse -He was counseled, will keep on nicotine patch   Dyslipidemia Diet controlled     DVT prophylaxis: Heparin Code Status: Full Family Communication: none at bedside Disposition:   Status is: Inpatient    Consultants:  renal   Subjective:  Of abdominal pain, no nausea, no vomiting, no further diarrhea Objective: Vitals:   05/25/23 2307 05/26/23 0500 05/26/23 0554 05/26/23 0716  BP: (!) 155/70  (!) 151/65 (!) 154/72  Pulse: 86  76 75  Resp: 17  16 12   Temp: 99 F (37.2 C)  98.3 F (36.8 C) 98.2 F (36.8 C)  TempSrc: Oral  Oral Oral  SpO2: 95%  91% 95%  Weight:  46.9 kg    Height:        Intake/Output Summary (Last 24 hours) at 05/26/2023 1334 Last data filed at 05/25/2023 1810 Gross per 24 hour  Intake --  Output 2000 ml  Net -2000 ml    Filed Weights   05/25/23 1412 05/25/23 1805  05/26/23 0500  Weight: 49.9 kg 48 kg 46.9 kg    Examination:  Awake Alert, Oriented X 3, frail Symmetrical Chest wall movement, Good air movement bilaterally, CTAB RRR,No Gallops,Rubs or new Murmurs, No Parasternal Heave +ve B.Sounds, Abd Soft, epigastric tenderness No Cyanosis, Clubbing or edema, No new Rash or bruise      Data Reviewed: I have personally reviewed following labs and imaging studies  CBC: Recent Labs  Lab 05/20/23 2002 05/21/23 0525  05/22/23 0551 05/23/23 0528 05/25/23 0506  WBC 5.8 5.3 6.1 5.2 6.1  NEUTROABS  --  3.2  --   --   --   HGB 12.1 11.3* 11.1* 11.6* 11.7*  HCT 39.2 36.5 34.8* 37.0 36.9  MCV 85.8 87.5 85.3 84.9 85.0  PLT 314 290 276 254 247    Basic Metabolic Panel: Recent Labs  Lab 05/21/23 0525 05/21/23 1531 05/22/23 0752 05/23/23 0528 05/25/23 0506  NA 139 137 134* 133* 133*  K 5.1 5.5* 5.6* 4.1 4.5  CL 100 95* 93* 92* 97*  CO2 23 21* 22 27 22   GLUCOSE 72 74 82 87 86  BUN 45* 50* 54* 23 42*  CREATININE 11.19* 11.87* 12.78* 7.10* 11.68*  CALCIUM 8.4* 8.6* 8.8* 9.1 9.2  MG 1.9  --   --   --   --   PHOS 6.0* 7.3* 7.5* 5.3*  --     GFR: Estimated Creatinine Clearance: 3.1 mL/min (A) (by C-G formula based on SCr of 11.68 mg/dL (H)).  Liver Function Tests: Recent Labs  Lab 05/20/23 2002 05/21/23 0525 05/21/23 1531 05/22/23 0752 05/23/23 0528  AST 17 11*  --   --   --   ALT 7 8  --   --   --   ALKPHOS 56 52  --   --   --   BILITOT 0.7 0.8  --   --   --   PROT 7.0 6.4*  --   --   --   ALBUMIN 3.3* 3.0* 3.3* 2.9* 3.0*    CBG: No results for input(s): "GLUCAP" in the last 168 hours.   No results found for this or any previous visit (from the past 240 hours).       Radiology Studies: CT ABDOMEN PELVIS W CONTRAST Result Date: 05/26/2023 CLINICAL DATA:  Progressive abdominal pain. EXAM: CT ABDOMEN AND PELVIS WITH CONTRAST TECHNIQUE: Multidetector CT imaging of the abdomen and pelvis was performed using the standard protocol following bolus administration of intravenous contrast. RADIATION DOSE REDUCTION: This exam was performed according to the departmental dose-optimization program which includes automated exposure control, adjustment of the mA and/or kV according to patient size and/or use of iterative reconstruction technique. CONTRAST:  75mL OMNIPAQUE IOHEXOL 350 MG/ML SOLN COMPARISON:  CT of the abdomen and pelvis without contrast on 05/20/2023. FINDINGS: Lower chest: Chronic  lung disease, bibasilar scarring and mild left basilar atelectasis. Hepatobiliary: No focal liver abnormality is seen. Contracted gallbladder. No biliary dilatation. Pancreas: There is some diffuse dilatation of the pancreatic duct reaching maximal caliber 7 mm in the neck/head of the pancreas. Some dilatation was present on the 12/11 study and less apparent on the 12/07 study. There does not appear to be an obstructing lesion or significant surrounding inflammation. Correlation suggested with lipase level. No pancreatic ductal calculi or parenchymal calcifications identified. Spleen: Normal in size without focal abnormality. Adrenals/Urinary Tract: Stable hyperplasia of both adrenal glands without focal lesion. Stable atrophic kidneys bilaterally likely reflecting end-stage renal disease. No hydronephrosis or solid  renal masses. The bladder is decompressed. Stomach/Bowel: Stable appearance of endoscopic hemostatic clip in the region of the gastric antrum. No evidence of bowel obstruction, ileus, free intraperitoneal air or visible focal lesion. Moderate fecal material in the distal half of the colon without significant focal fecal impaction. Vascular/Lymphatic: Advanced atherosclerosis of the abdominal aorta and iliac arteries without aneurysm. Reproductive: Status post hysterectomy. No adnexal masses. Other: No abdominal wall hernia or abnormality. No abdominopelvic ascites. Musculoskeletal: No acute or significant osseous findings. IMPRESSION: 1. Diffuse dilatation of the pancreatic duct reaching maximal caliber 7 mm in the neck/head of the pancreas. Some dilatation was present on the 12/11 study and less apparent on the 12/07 study. There does not appear to be an obstructing lesion or significant surrounding inflammation. Correlation suggested with lipase level. 2. Stable atrophic kidneys bilaterally likely reflecting end-stage renal disease. 3. Stable hyperplasia of both adrenal glands without focal lesion. 4.  Advanced atherosclerosis of the abdominal aorta and iliac arteries without aneurysm. Aortic Atherosclerosis (ICD10-I70.0). Electronically Signed   By: Irish Lack M.D.   On: 05/26/2023 12:34         Scheduled Meds:  (feeding supplement) PROSource Plus  30 mL Oral BID BM   amLODipine  10 mg Oral Q1200   Chlorhexidine Gluconate Cloth  6 each Topical Q0600   gabapentin  200 mg Oral QHS   heparin injection (subcutaneous)  5,000 Units Subcutaneous Q8H   hydrALAZINE  75 mg Oral Q8H   lipase/protease/amylase  48,000 Units Oral BID WC   pantoprazole  40 mg Oral BID   sevelamer carbonate  800 mg Oral TID WC   Continuous Infusions:  cefTRIAXone (ROCEPHIN)  IV Stopped (05/25/23 1735)   metronidazole 500 mg (05/26/23 0548)   promethazine (PHENERGAN) injection (IM or IVPB)       LOS: 6 days       Huey Bienenstock, MD [facility]   To contact the attending provider between 7A-7P or the covering provider during after hours 7P-7A, please log into the web site [email or web address] and access using universal [place] password for that web site. If you do not have the password, please call the hospital operator.  05/26/2023, 1:34 PM

## 2023-05-26 NOTE — Care Management Important Message (Signed)
Important Message  Patient Details  Name: Ashley [name] MRN: [id number] Date of Birth: 22-Apr-1951   Important Message Given:  Yes - Medicare IM     Dorena Bodo 05/26/2023, 3:55 PM

## 2023-05-26 NOTE — Progress Notes (Signed)
[facility] Progress Note   Subjective:  Seen in room. Resting but wakes easily for questions, feels a little better today. For HD tomorrow. CT abdomen this AM, awaiting report.  Objective Vitals:   05/25/23 2307 05/26/23 0500 05/26/23 0554 05/26/23 0716  BP: (!) 155/70  (!) 151/65 (!) 154/72  Pulse: 86  76 75  Resp: 17  16 12   Temp: 99 F (37.2 C)  98.3 F (36.8 C) 98.2 F (36.8 C)  TempSrc: Oral  Oral Oral  SpO2: 95%  91% 95%  Weight:  46.9 kg    Height:       Physical Exam General: Well appearing, NAD. Room air. Heart: RRR; no murmur Lungs: CTAB Abdomen: soft Extremities: no LE edema Dialysis Access: LUE AVF  Additional Objective Labs: Basic Metabolic Panel: Recent Labs  Lab 05/21/23 1531 05/22/23 0752 05/23/23 0528 05/25/23 0506  NA 137 134* 133* 133*  K 5.5* 5.6* 4.1 4.5  CL 95* 93* 92* 97*  CO2 21* 22 27 22   GLUCOSE 74 82 87 86  BUN 50* 54* 23 42*  CREATININE 11.87* 12.78* 7.10* 11.68*  CALCIUM 8.6* 8.8* 9.1 9.2  PHOS 7.3* 7.5* 5.3*  --    Liver Function Tests: Recent Labs  Lab 05/20/23 2002 05/21/23 0525 05/21/23 1531 05/22/23 0752 05/23/23 0528  AST 17 11*  --   --   --   ALT 7 8  --   --   --   ALKPHOS 56 52  --   --   --   BILITOT 0.7 0.8  --   --   --   PROT 7.0 6.4*  --   --   --   ALBUMIN 3.3* 3.0* 3.3* 2.9* 3.0*   Recent Labs  Lab 05/20/23 2002 05/21/23 0525  LIPASE 65* 53*   CBC: Recent Labs  Lab 05/20/23 2002 05/21/23 0525 05/22/23 0551 05/23/23 0528 05/25/23 0506  WBC 5.8 5.3 6.1 5.2 6.1  NEUTROABS  --  3.2  --   --   --   HGB 12.1 11.3* 11.1* 11.6* 11.7*  HCT 39.2 36.5 34.8* 37.0 36.9  MCV 85.8 87.5 85.3 84.9 85.0  PLT 314 290 276 254 247   Medications:  cefTRIAXone (ROCEPHIN)  IV Stopped (05/25/23 1735)   metronidazole 500 mg (05/26/23 0548)   promethazine (PHENERGAN) injection (IM or IVPB)      (feeding supplement) PROSource Plus  30 mL Oral BID BM   amLODipine  10 mg Oral Q1200    Chlorhexidine Gluconate Cloth  6 each Topical Q0600   Chlorhexidine Gluconate Cloth  6 each Topical Q0600   gabapentin  200 mg Oral QHS   heparin injection (subcutaneous)  5,000 Units Subcutaneous Q8H   hydrALAZINE  75 mg Oral Q8H   lipase/protease/amylase  48,000 Units Oral BID WC   pantoprazole  40 mg Oral BID   sevelamer carbonate  800 mg Oral TID WC    Dialysis Orders: [facility]  3.5h   350/ 500  46.2kg   2/2.5 bath   AVF   Heparin none - last HD 12/09, post wt 46.3kg - getting to dry wt mostly   Assessment/Plan: Abdominal pain/diarrhea: Possible enteritis per CT. She does have chronic pancreatitis. Diarrhea improved and abd pain is less. Finishing course of Ceftriaxone + Flagyl, on home Creon. CT abd this AM - pending. ESRD: Continue HD on usual MWF - for HD tomorrow. HTN - BP up slightly, UF as tolerated. Weights more accurate now. No  edema on exam. Volume - CXR w/ vasc congestion on admission, UF as tolerated. Anemia of ESRD - Hb 11.7, no ESA needed. Secondary HPTH: CorrCa slightly high - no VDRA here, Phos good - continue sevelamer. Nutrition: Alb low, ok for protein supplements as long as they don't exacerbate diarrhea.    Ashley Hoyle, PA-C 05/26/2023, 11:45 AM  [employer]

## 2023-05-26 NOTE — Plan of Care (Signed)

## 2023-05-27 ENCOUNTER — Inpatient Hospital Stay (HOSPITAL_COMMUNITY): Payer: 59

## 2023-05-27 DIAGNOSIS — I10 Essential (primary) hypertension: Secondary | ICD-10-CM

## 2023-05-27 DIAGNOSIS — N186 End stage renal disease: Secondary | ICD-10-CM

## 2023-05-27 DIAGNOSIS — K861 Other chronic pancreatitis: Secondary | ICD-10-CM | POA: Diagnosis not present

## 2023-05-27 DIAGNOSIS — Z992 Dependence on renal dialysis: Secondary | ICD-10-CM

## 2023-05-27 DIAGNOSIS — E875 Hyperkalemia: Secondary | ICD-10-CM | POA: Diagnosis not present

## 2023-05-27 DIAGNOSIS — E871 Hypo-osmolality and hyponatremia: Secondary | ICD-10-CM

## 2023-05-27 DIAGNOSIS — R935 Abnormal findings on diagnostic imaging of other abdominal regions, including retroperitoneum: Secondary | ICD-10-CM | POA: Diagnosis not present

## 2023-05-27 LAB — CBC
HCT: 35.4 % — ABNORMAL LOW (ref 36.0–46.0)
Hemoglobin: 11.4 g/dL — ABNORMAL LOW (ref 12.0–15.0)
MCH: 26.8 pg (ref 26.0–34.0)
MCHC: 32.2 g/dL (ref 30.0–36.0)
MCV: 83.3 fL (ref 80.0–100.0)
Platelets: 237 10*3/uL (ref 150–400)
RBC: 4.25 MIL/uL (ref 3.87–5.11)
RDW: 18.6 % — ABNORMAL HIGH (ref 11.5–15.5)
WBC: 5.8 10*3/uL (ref 4.0–10.5)
nRBC: 0 % (ref 0.0–0.2)

## 2023-05-27 LAB — BASIC METABOLIC PANEL
Anion gap: 17 — ABNORMAL HIGH (ref 5–15)
BUN: 37 mg/dL — ABNORMAL HIGH (ref 8–23)
CO2: 19 mmol/L — ABNORMAL LOW (ref 22–32)
Calcium: 9.3 mg/dL (ref 8.9–10.3)
Chloride: 92 mmol/L — ABNORMAL LOW (ref 98–111)
Creatinine, Ser: 8.77 mg/dL — ABNORMAL HIGH (ref 0.44–1.00)
GFR, Estimated: 4 mL/min — ABNORMAL LOW (ref >60)
Glucose, Bld: 74 mg/dL (ref 70–99)
Potassium: 4.7 mmol/L (ref 3.5–5.1)
Sodium: 128 mmol/L — ABNORMAL LOW (ref 135–145)

## 2023-05-27 MED ORDER — HEPARIN SODIUM (PORCINE) 1000 UNIT/ML DIALYSIS: 1000.0000 [IU] | INTRAMUSCULAR | Status: DC | PRN

## 2023-05-27 MED ORDER — ALTEPLASE 2 MG IJ SOLR: 2.0000 mg | Freq: Once | INTRAMUSCULAR | Status: DC | PRN

## 2023-05-27 MED ORDER — PANTOPRAZOLE SODIUM 40 MG PO TBEC
40.0000 mg | DELAYED_RELEASE_TABLET | Freq: Every day | ORAL | Status: DC
  Administered 2023-05-28: 40 mg via ORAL
  Filled 2023-05-27: qty 1

## 2023-05-27 MED ORDER — PENTAFLUOROPROP-TETRAFLUOROETH EX AERO: 1.0000 | INHALATION_SPRAY | CUTANEOUS | Status: DC | PRN

## 2023-05-27 MED ORDER — LIDOCAINE HCL (PF) 1 % IJ SOLN: 5.0000 mL | INTRAMUSCULAR | Status: DC | PRN

## 2023-05-27 MED ORDER — ANTICOAGULANT SODIUM CITRATE 4% (200MG/5ML) IV SOLN: 5.0000 mL | Status: DC | PRN

## 2023-05-27 MED ORDER — LIDOCAINE-PRILOCAINE 2.5-2.5 % EX CREA: 1.0000 | TOPICAL_CREAM | CUTANEOUS | Status: DC | PRN

## 2023-05-27 NOTE — Progress Notes (Signed)
[facility] Progress Note   Subjective:   Seen on HD - 1.5L UFG and tolerating. No CP/dyspnea, + abd pain today - worse than yesterday.  Objective Vitals:   05/27/23 0414 05/27/23 0450 05/27/23 0739 05/27/23 0747  BP: (!) 150/66  (!) 151/75 (!) 154/64  Pulse: 67  71 69  Resp: 16  17 18   Temp: 98 F (36.7 C)  98.1 F (36.7 C)   TempSrc: Oral     SpO2: 94%  94% 93%  Weight:  50.8 kg 48.7 kg   Height:       Physical Exam General: Well appearing, NAD. Room air. Heart: RRR; no murmur Lungs: CTAB Abdomen: soft, but mild generalized tenderness Extremities: no LE edema Dialysis Access: LUE AVF    Additional Objective Labs: Basic Metabolic Panel: Recent Labs  Lab 05/21/23 1531 05/22/23 0752 05/23/23 0528 05/25/23 0506 05/27/23 0437  NA 137 134* 133* 133* 128*  K 5.5* 5.6* 4.1 4.5 4.7  CL 95* 93* 92* 97* 92*  CO2 21* 22 27 22  19*  GLUCOSE 74 82 87 86 74  BUN 50* 54* 23 42* 37*  CREATININE 11.87* 12.78* 7.10* 11.68* 8.77*  CALCIUM 8.6* 8.8* 9.1 9.2 9.3  PHOS 7.3* 7.5* 5.3*  --   --    Liver Function Tests: Recent Labs  Lab 05/20/23 2002 05/21/23 0525 05/21/23 1531 05/22/23 0752 05/23/23 0528 05/26/23 1358  AST 17 11*  --   --   --  36  ALT 7 8  --   --   --  15  ALKPHOS 56 52  --   --   --  53  BILITOT 0.7 0.8  --   --   --  0.6  PROT 7.0 6.4*  --   --   --  6.2*  ALBUMIN 3.3* 3.0*   < > 2.9* 3.0* 2.7*   < > = values in this interval not displayed.   Recent Labs  Lab 05/20/23 2002 05/21/23 0525 05/26/23 1358  LIPASE 65* 53* 58*   CBC: Recent Labs  Lab 05/21/23 0525 05/22/23 0551 05/23/23 0528 05/25/23 0506 05/27/23 0437  WBC 5.3 6.1 5.2 6.1 5.8  NEUTROABS 3.2  --   --   --   --   HGB 11.3* 11.1* 11.6* 11.7* 11.4*  HCT 36.5 34.8* 37.0 36.9 35.4*  MCV 87.5 85.3 84.9 85.0 83.3  PLT 290 276 254 247 237   Studies/Results: CT ABDOMEN PELVIS W CONTRAST Result Date: 05/26/2023 CLINICAL DATA:  Progressive abdominal pain. EXAM: CT  ABDOMEN AND PELVIS WITH CONTRAST TECHNIQUE: Multidetector CT imaging of the abdomen and pelvis was performed using the standard protocol following bolus administration of intravenous contrast. RADIATION DOSE REDUCTION: This exam was performed according to the departmental dose-optimization program which includes automated exposure control, adjustment of the mA and/or kV according to patient size and/or use of iterative reconstruction technique. CONTRAST:  75mL OMNIPAQUE IOHEXOL 350 MG/ML SOLN COMPARISON:  CT of the abdomen and pelvis without contrast on 05/20/2023. FINDINGS: Lower chest: Chronic lung disease, bibasilar scarring and mild left basilar atelectasis. Hepatobiliary: No focal liver abnormality is seen. Contracted gallbladder. No biliary dilatation. Pancreas: There is some diffuse dilatation of the pancreatic duct reaching maximal caliber 7 mm in the neck/head of the pancreas. Some dilatation was present on the 12/11 study and less apparent on the 12/07 study. There does not appear to be an obstructing lesion or significant surrounding inflammation. Correlation suggested with lipase level. No pancreatic ductal calculi  or parenchymal calcifications identified. Spleen: Normal in size without focal abnormality. Adrenals/Urinary Tract: Stable hyperplasia of both adrenal glands without focal lesion. Stable atrophic kidneys bilaterally likely reflecting end-stage renal disease. No hydronephrosis or solid renal masses. The bladder is decompressed. Stomach/Bowel: Stable appearance of endoscopic hemostatic clip in the region of the gastric antrum. No evidence of bowel obstruction, ileus, free intraperitoneal air or visible focal lesion. Moderate fecal material in the distal half of the colon without significant focal fecal impaction. Vascular/Lymphatic: Advanced atherosclerosis of the abdominal aorta and iliac arteries without aneurysm. Reproductive: Status post hysterectomy. No adnexal masses. Other: No abdominal  wall hernia or abnormality. No abdominopelvic ascites. Musculoskeletal: No acute or significant osseous findings. IMPRESSION: 1. Diffuse dilatation of the pancreatic duct reaching maximal caliber 7 mm in the neck/head of the pancreas. Some dilatation was present on the 12/11 study and less apparent on the 12/07 study. There does not appear to be an obstructing lesion or significant surrounding inflammation. Correlation suggested with lipase level. 2. Stable atrophic kidneys bilaterally likely reflecting end-stage renal disease. 3. Stable hyperplasia of both adrenal glands without focal lesion. 4. Advanced atherosclerosis of the abdominal aorta and iliac arteries without aneurysm. Aortic Atherosclerosis (ICD10-I70.0). Electronically Signed   By: Irish Lack M.D.   On: 05/26/2023 12:34   Medications:  anticoagulant sodium citrate     promethazine (PHENERGAN) injection (IM or IVPB)      (feeding supplement) PROSource Plus  30 mL Oral BID BM   amLODipine  10 mg Oral Q1200   Chlorhexidine Gluconate Cloth  6 each Topical Q0600   gabapentin  200 mg Oral QHS   heparin injection (subcutaneous)  5,000 Units Subcutaneous Q8H   hydrALAZINE  75 mg Oral Q8H   lipase/protease/amylase  48,000 Units Oral BID WC   pantoprazole  40 mg Oral BID   sevelamer carbonate  800 mg Oral TID WC    Dialysis Orders: MWF [facility] 3.5hr, 350/500, EDW 46.2kg, 2K/2.5Ca bath, AVF, no heparin - last HD 12/09, post wt 46.3kg - getting to dry wt mostly   Assessment/Plan: Abdominal pain/diarrhea: Possible enteritis per CT. She does have chronic pancreatitis. Diarrhea improved and abd pain is less. Finishing course of Ceftriaxone + Flagyl, on home Creon. CT abd 12/17 with dilation pancreatic duct; no acute findings. ESRD: Continue HD on usual MWF - HD now, 1.5L UFG. Possibly ^ UFG to 2L if BP remains high. HTN - BP up slightly, UF as tolerated. No edema on exam, but hypoNa. Volume - CXR w/ vasc congestion on admission, UF as  tolerated. 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 Secondary HPTH: CorrCa slightly high - no VDRA here, Phos good - continue sevelamer. Nutrition: Alb low, ok for protein supplements as long as they don't exacerbate diarrhea.  Ozzie Hoyle, PA-C 05/27/2023, 8:54 AM  [employer]

## 2023-05-27 NOTE — Progress Notes (Signed)
Mobility Specialist Progress Note;   05/27/23 1410  Mobility  Activity Ambulated with assistance in room  Level of Assistance Contact guard assist, steadying assist  Assistive Device Front wheel walker  Distance Ambulated (ft) 20 ft  Activity Response Tolerated well  Mobility Referral Yes  Mobility visit 1 Mobility  Mobility Specialist Start Time (ACUTE ONLY) 1410  Mobility Specialist Stop Time (ACUTE ONLY) 1420  Mobility Specialist Time Calculation (min) (ACUTE ONLY) 10 min   Pt agreeable to mobility w/ encouragement. Required MinG assistance during ambulation for safety. Ambulated to doorframe however deferred hallway ambulation. VSS throughout and no c/o pain during session. Pt returned back to bed with all needs met, alarm on.   Caesar Bookman Mobility Specialist Please contact via SecureChat or [employer] [phone]

## 2023-05-27 NOTE — Progress Notes (Signed)
Received patient in bed to unit.  Alert and oriented.  Informed consent signed and in chart.   TX duration:3.5 hours  Patient tolerated well.  Transported back to the room  Alert, without acute distress.  Hand-off given to patient's nurse.   Access used: Left Upper Arm Fistula Access issues: none  Total UF removed: 2L Medication(s) given: Oxycodone   05/27/23 1129  Vitals  Temp 98.1 F (36.7 C)  BP (!) 153/71  MAP (mmHg) 95  Pulse Rate 90  ECG Heart Rate 90  Resp 13  Oxygen Therapy  SpO2 95 %  O2 Device Room Air  Patient Activity (if Appropriate) In bed  Pulse Oximetry Type Continuous  During Treatment Monitoring  Duration of HD Treatment -hour(s) 3.5 hour(s)  Cumulative Fluid Removed (mL) per Treatment  2000.15  HD Safety Checks Performed Yes  Intra-Hemodialysis Comments Tx completed;Tolerated well  Dialysis Fluid Bolus Normal Saline  Bolus Amount (mL) 300 mL  Post Treatment  Dialyzer Clearance Lightly streaked  Liters Processed 84  Fluid Removed (mL) 2000 mL  Tolerated HD Treatment Yes  AVG/AVF Arterial Site Held (minutes) 10 minutes  AVG/AVF Venous Site Held (minutes) 10 minutes  Fistula / Graft Left Upper arm Arteriovenous fistula  No Placement Date or Time found.   Placed prior to admission: Yes  Orientation: Left  Access Location: Upper arm  Access Type: Arteriovenous fistula  Status Deaccessed     Stacie Glaze LPN [facility]

## 2023-05-27 NOTE — Plan of Care (Signed)

## 2023-05-27 NOTE — Progress Notes (Signed)
PROGRESS NOTE        PATIENT DETAILS Name: Ashley [name] Age: 72 y.o. Sex: female Date of Birth: 1950-06-24 Admit Date: 05/20/2023 Admitting Physician Angie Fava, DO WUJ:[id number], Marlynn Perking., FNP  Brief Summary: Patient is a 72 y.o.  female with history of ESRD on HD MWF, chronic pancreatitis, HTN, HLD, tobacco use-who presented with 2-week history  of diarrhea/abdominal pain.  Significant events: 12/11>> admit to [facility]  Significant studies: 12/11>> CT abdomen/pelvis: No acute abnormality 12/17>> CT abdomen/pelvis: Diffuse dilatation of the pancreatic duct-7 mm.  Significant microbiology data: None  Procedures: None  Consults: Nephrology GI  Subjective: Diarrhea much better Continues to have abdominal pain.  Objective: Vitals: Blood pressure (!) 148/74, pulse 90, temperature 99.1 F (37.3 C), temperature source Oral, resp. rate 19, height 5\' 2"  (1.575 m), weight 46.5 kg, SpO2 93%.   Exam: Gen Exam:Alert awake-not in any distress HEENT:atraumatic, normocephalic Chest: B/L clear to auscultation anteriorly CVS:S1S2 regular Abdomen:soft mildly tender in the epigastric area without any peritoneal signs.   Extremities:no edema Neurology: Non focal Skin: no rash  Pertinent Labs/Radiology:    Latest Ref Rng & Units 05/27/2023    4:37 AM 05/25/2023    5:06 AM 05/23/2023    5:28 AM  CBC  WBC 4.0 - 10.5 K/uL 5.8  6.1  5.2   Hemoglobin 12.0 - 15.0 g/dL 47.8  29.5  62.1   Hematocrit 36.0 - 46.0 % 35.4  36.9  37.0   Platelets 150 - 400 K/uL 237  247  254     Lab Results  Component Value Date   NA 128 (L) 05/27/2023   K 4.7 05/27/2023   CL 92 (L) 05/27/2023   CO2 19 (L) 05/27/2023      Assessment/Plan: Gastroenteritis Resolved-tolerating advancement in diet Completed 5 days of empiric Rocephin/Flagyl  Acute on chronic pancreatitis Continues to have persistent-worse abdominal pain than usual baseline.  Tolerating regular  diet. CT abdomen on 12/17-with more dilatation of the main pancreatic duct-lipase only minimally elevated-LFTs appear stable Await GI input Continue supportive care with Creon-and as needed narcotics.  Hyperkalemia Resolved  ESRD on HD MWF Nephrology following and directing care  Hyponatremia Should improve with HD today. Follow electrolytes periodically.  HTN Stable Amlodipine  GERD PPI On long-term Carafate-which has been discontinued due to ESRD status  Tobacco abuse Transdermal nicotine  Underweight: Estimated body mass index is 18.75 kg/m as calculated from the following:   Height as of this encounter: 5\' 2"  (1.575 m).   Weight as of this encounter: 46.5 kg.   Code status:   Code Status: Full Code   DVT Prophylaxis: heparin injection 5,000 Units Start: 05/21/23 0800   Family Communication: None at bedside   Disposition Plan: Status is: Inpatient Remains inpatient appropriate because: Severity of illness   Planned Discharge Destination:Home   Diet: Diet Order             Diet renal with fluid restriction Fluid restriction: 1200 mL Fluid; Room service appropriate? Yes; Fluid consistency: Thin  Diet effective now                     Antimicrobial agents: Anti-infectives (From admission, onward)    Start     Dose/Rate Route Frequency Ordered Stop   05/21/23 1630  cefTRIAXone (ROCEPHIN) 2 g in sodium chloride 0.9 %  100 mL IVPB  Status:  Discontinued        2 g 200 mL/hr over 30 Minutes Intravenous Every 24 hours 05/21/23 1541 05/26/23 1335   05/21/23 1630  metroNIDAZOLE (FLAGYL) IVPB 500 mg  Status:  Discontinued        500 mg 100 mL/hr over 60 Minutes Intravenous Every 12 hours 05/21/23 1541 05/26/23 1335        MEDICATIONS: Scheduled Meds:  (feeding supplement) PROSource Plus  30 mL Oral BID BM   amLODipine  10 mg Oral Q1200   Chlorhexidine Gluconate Cloth  6 each Topical Q0600   gabapentin  200 mg Oral QHS   heparin injection  (subcutaneous)  5,000 Units Subcutaneous Q8H   hydrALAZINE  75 mg Oral Q8H   lipase/protease/amylase  48,000 Units Oral BID WC   pantoprazole  40 mg Oral BID   sevelamer carbonate  800 mg Oral TID WC   Continuous Infusions:  promethazine (PHENERGAN) injection (IM or IVPB)     PRN Meds:.acetaminophen **OR** acetaminophen, melatonin, naLOXone (NARCAN)  injection, ondansetron (ZOFRAN) IV, oxyCODONE, promethazine (PHENERGAN) injection (IM or IVPB)   I have personally reviewed following labs and imaging studies  LABORATORY DATA: CBC: Recent Labs  Lab 05/21/23 0525 05/22/23 0551 05/23/23 0528 05/25/23 0506 05/27/23 0437  WBC 5.3 6.1 5.2 6.1 5.8  NEUTROABS 3.2  --   --   --   --   HGB 11.3* 11.1* 11.6* 11.7* 11.4*  HCT 36.5 34.8* 37.0 36.9 35.4*  MCV 87.5 85.3 84.9 85.0 83.3  PLT 290 276 254 247 237    Basic Metabolic Panel: Recent Labs  Lab 05/21/23 0525 05/21/23 1531 05/22/23 0752 05/23/23 0528 05/25/23 0506 05/27/23 0437  NA 139 137 134* 133* 133* 128*  K 5.1 5.5* 5.6* 4.1 4.5 4.7  CL 100 95* 93* 92* 97* 92*  CO2 23 21* 22 27 22  19*  GLUCOSE 72 74 82 87 86 74  BUN 45* 50* 54* 23 42* 37*  CREATININE 11.19* 11.87* 12.78* 7.10* 11.68* 8.77*  CALCIUM 8.4* 8.6* 8.8* 9.1 9.2 9.3  MG 1.9  --   --   --   --   --   PHOS 6.0* 7.3* 7.5* 5.3*  --   --     GFR: Estimated Creatinine Clearance: 4.1 mL/min (A) (by C-G formula based on SCr of 8.77 mg/dL (H)).  Liver Function Tests: Recent Labs  Lab 05/20/23 2002 05/21/23 0525 05/21/23 1531 05/22/23 0752 05/23/23 0528 05/26/23 1358  AST 17 11*  --   --   --  36  ALT 7 8  --   --   --  15  ALKPHOS 56 52  --   --   --  53  BILITOT 0.7 0.8  --   --   --  0.6  PROT 7.0 6.4*  --   --   --  6.2*  ALBUMIN 3.3* 3.0* 3.3* 2.9* 3.0* 2.7*   Recent Labs  Lab 05/20/23 2002 05/21/23 0525 05/26/23 1358  LIPASE 65* 53* 58*   No results for input(s): "AMMONIA" in the last 168 hours.  Coagulation Profile: No results for  input(s): "INR", "PROTIME" in the last 168 hours.  Cardiac Enzymes: No results for input(s): "CKTOTAL", "CKMB", "CKMBINDEX", "TROPONINI" in the last 168 hours.  BNP (last 3 results) No results for input(s): "PROBNP" in the last 8760 hours.  Lipid Profile: No results for input(s): "CHOL", "HDL", "LDLCALC", "TRIG", "CHOLHDL", "LDLDIRECT" in the last 72 hours.  Thyroid  Function Tests: No results for input(s): "TSH", "T4TOTAL", "FREET4", "T3FREE", "THYROIDAB" in the last 72 hours.  Anemia Panel: No results for input(s): "VITAMINB12", "FOLATE", "FERRITIN", "TIBC", "IRON", "RETICCTPCT" in the last 72 hours.  Urine analysis:    Component Value Date/Time   COLORURINE YELLOW 12/31/2018 1014   APPEARANCEUR CLEAR 12/31/2018 1014   LABSPEC 1.008 12/31/2018 1014   PHURINE 9.0 (H) 12/31/2018 1014   GLUCOSEU NEGATIVE 12/31/2018 1014   HGBUR SMALL (A) 12/31/2018 1014   BILIRUBINUR NEGATIVE 12/31/2018 1014   BILIRUBINUR NEG 01/29/2016 1142   KETONESUR NEGATIVE 12/31/2018 1014   PROTEINUR >=300 (A) 12/31/2018 1014   UROBILINOGEN 0.2 01/29/2016 1142   UROBILINOGEN 0.2 03/21/2015 1649   NITRITE NEGATIVE 12/31/2018 1014   LEUKOCYTESUR NEGATIVE 12/31/2018 1014    Sepsis Labs: Lactic Acid, Venous    Component Value Date/Time   LATICACIDVEN 1.2 04/24/2023 1601    MICROBIOLOGY: No results found for this or any previous visit (from the past 240 hours).  RADIOLOGY STUDIES/RESULTS: CT ABDOMEN PELVIS W CONTRAST Result Date: 05/26/2023 CLINICAL DATA:  Progressive abdominal pain. EXAM: CT ABDOMEN AND PELVIS WITH CONTRAST TECHNIQUE: Multidetector CT imaging of the abdomen and pelvis was performed using the standard protocol following bolus administration of intravenous contrast. RADIATION DOSE REDUCTION: This exam was performed according to the departmental dose-optimization program which includes automated exposure control, adjustment of the mA and/or kV according to patient size and/or use of  iterative reconstruction technique. CONTRAST:  75mL OMNIPAQUE IOHEXOL 350 MG/ML SOLN COMPARISON:  CT of the abdomen and pelvis without contrast on 05/20/2023. FINDINGS: Lower chest: Chronic lung disease, bibasilar scarring and mild left basilar atelectasis. Hepatobiliary: No focal liver abnormality is seen. Contracted gallbladder. No biliary dilatation. Pancreas: There is some diffuse dilatation of the pancreatic duct reaching maximal caliber 7 mm in the neck/head of the pancreas. Some dilatation was present on the 12/11 study and less apparent on the 12/07 study. There does not appear to be an obstructing lesion or significant surrounding inflammation. Correlation suggested with lipase level. No pancreatic ductal calculi or parenchymal calcifications identified. Spleen: Normal in size without focal abnormality. Adrenals/Urinary Tract: Stable hyperplasia of both adrenal glands without focal lesion. Stable atrophic kidneys bilaterally likely reflecting end-stage renal disease. No hydronephrosis or solid renal masses. The bladder is decompressed. Stomach/Bowel: Stable appearance of endoscopic hemostatic clip in the region of the gastric antrum. No evidence of bowel obstruction, ileus, free intraperitoneal air or visible focal lesion. Moderate fecal material in the distal half of the colon without significant focal fecal impaction. Vascular/Lymphatic: Advanced atherosclerosis of the abdominal aorta and iliac arteries without aneurysm. Reproductive: Status post hysterectomy. No adnexal masses. Other: No abdominal wall hernia or abnormality. No abdominopelvic ascites. Musculoskeletal: No acute or significant osseous findings. IMPRESSION: 1. Diffuse dilatation of the pancreatic duct reaching maximal caliber 7 mm in the neck/head of the pancreas. Some dilatation was present on the 12/11 study and less apparent on the 12/07 study. There does not appear to be an obstructing lesion or significant surrounding inflammation.  Correlation suggested with lipase level. 2. Stable atrophic kidneys bilaterally likely reflecting end-stage renal disease. 3. Stable hyperplasia of both adrenal glands without focal lesion. 4. Advanced atherosclerosis of the abdominal aorta and iliac arteries without aneurysm. Aortic Atherosclerosis (ICD10-I70.0). Electronically Signed   By: Irish Lack M.D.   On: 05/26/2023 12:34     LOS: 7 days   Jeoffrey Massed, MD  [facility]    To contact the attending provider between 7A-7P or the covering  provider during after hours 7P-7A, please log into the web site [email or web address] and access using universal [place] password for that web site. If you do not have the password, please call the hospital operator.  05/27/2023, 1:53 PM

## 2023-05-27 NOTE — Progress Notes (Signed)
OT Cancellation Note  Patient Details Name: Ashley [name] MRN: [id number] DOB: 1950-09-02   Cancelled Treatment:    Reason Eval/Treat Not Completed: Patient declined, no reason specified (attempted to engage with pt for therapy, she declined and reports recently getting back to bed in the past hour following working with mobility team.) reinforced the benefits of mobility and encouraged pt to perform hall mobility next time, she states that she will make efforts with therapy tomorrow. OT to continue to follow-up with pt as able.   05/27/2023  AB, OTR/L  Acute Rehabilitation Services  Office: [phone]   Tristan Schroeder 05/27/2023, 4:24 PM

## 2023-05-27 NOTE — Consult Note (Signed)
Consultation  Referring Provider: TRH/ Ghimire Primary Care Physician:  Raymon Mutton., FNP Primary Gastroenterologist:  Dr.Pyrtle  Reason for Consultation:  peristent abdominal pain  HPI: Ashley [name] is a 72 y.o. female with multiple medical comorbidities, including end-stage renal disease for which she was on dialysis, prior CVA, history of hepatitis C/treated, hypertension, prior history of perforated peptic ulcer disease, previously documented duodenal AVMs, and chronic pancreatitis with exocrine pancreatic insufficiency.  She also has history of substance abuse. She was admitted about a week ago with 1 to 2-week history of somewhat progressive abdominal pain which she says is located in the mid abdomen, at that time was associated with nausea. Initial imaging with noncontrasted CT was more concerning for an acute gastroenteritis.  She had a contracted stomach with what was felt to be some thickened folds and some thickened folds in the left abdomen small bowel loops.  On that study no gallstones, no ductal dilation and normal-appearing pancreas. Due to persistent complaints of abdominal pain which she now feels is related to her "pancreas pain" Ashley [name] contrasted CT was done yesterday which showed diffuse dilation of the pancreatic duct 7 mm in the neck of the head, no obstructive lesion or inflammation is seen, no pancreatic duct or pancreatic calcifications. She describes this pain as just a constant nagging pain in her mid abdomen which seems to be unaffected by eating.  She says she is eating better at this point is not having any nausea, she had some diarrhea last night and once this morning but that was after oral contrast.  She says this pain is usually helped by oral pain medication. She does have prior history of EtOH use but denies any ongoing heavy EtOH abuse in the past.  She is also recently been seen by GI during her last admission in October that time due to GI bleeding.  She  underwent colonoscopy per Dr. Myrtie Neither with removal of 3 small sessile polyps all 3 to 8 mm in size, also noted to have internal hemorrhoids.  EGD at that same time with a single 4 mm nodule in the gastric antrum, and very mild changes of gastritis.  Biopsies from the nodule showed this to be a hyperplastic polyp, gastric biopsy showed chronic inactive gastritis no H. pylori does have intestinal metaplasia, the colon polyps were all tubular adenomas No high-grade dysplasia.  Labs yesterday-lipase 58 LFTs within normal limits Today WBC 5.8/hemoglobin 11.4/hematocrit 35.4 Sodium 128/potassium 4.7/BUN 37/creatinine 0.77    Past Medical History:  Diagnosis Date   Acute pancreatitis 2000   2000, 12/2018, 08/2019   Arthritis    Cervical radiculopathy 02/28/2011   Cocaine substance abuse (HCC) 05/26/2013   positive UDS    Duodenitis    Erosive gastropathy    ESRD on hemodialysis (HCC)    TTS   GERD (gastroesophageal reflux disease)    Hepatitis C 1987   dt hx IVDA.  genotype 2B.  Epclusa started early 04/2020.     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  Chronic microvascular ischemic changes w parenchymal volume loss. Chronic white matter periventricular microhemorrhage, likely due to htn   Ulcer 1990   gastric ulcer. Ruptured s/p emergency repair  Past Surgical History:  Procedure Laterality Date   ABDOMINAL HYSTERECTOMY  1979   AV FISTULA PLACEMENT Left 06/16/2017   Procedure: ARTERIOVENOUS (AV) FISTULA CREATION LEFT ARM;  Surgeon: Fransisco Hertz, MD;  Location: [facility] OR;  Service: Vascular;  Laterality: Left;   BASCILIC VEIN TRANSPOSITION Left 10/02/2017   Procedure: BASILIC VEIN TRANSPOSITION SECOND STAGE LEFT ARM;  Surgeon: Larina Earthly, MD;  Location: [facility] OR;  Service: Vascular;  Laterality: Left;   BIOPSY  09/06/2019   Procedure: BIOPSY;  Surgeon: Iva Boop, MD;  Location: [facility] ENDOSCOPY;  Service: Endoscopy;;   BIOPSY  03/13/2023   Procedure: BIOPSY;  Surgeon: Shellia Cleverly, DO;  Location: [facility] ENDOSCOPY;  Service: Gastroenterology;;   COLONOSCOPY WITH PROPOFOL N/A 03/13/2023   Procedure: COLONOSCOPY WITH PROPOFOL;  Surgeon: Shellia Cleverly, DO;  Location: [facility] ENDOSCOPY;  Service: Gastroenterology;  Laterality: N/A;   ENTEROSCOPY N/A 10/22/2022   Procedure: ENTEROSCOPY;  Surgeon: Sherrilyn Rist, MD;  Location: [facility] ENDOSCOPY;  Service: Gastroenterology;  Laterality: N/A;   ESOPHAGOGASTRODUODENOSCOPY N/A 05/29/2013   Procedure: ESOPHAGOGASTRODUODENOSCOPY (EGD);  Surgeon: Beverley Fiedler, MD;  Location: [facility] ENDOSCOPY;  Service: Endoscopy;  Laterality: N/A;   ESOPHAGOGASTRODUODENOSCOPY  05/2013   for epigastric pain.  Nonobstructing Schatzki ring at GEJ, mild gastropathy, nonbleeding AVMs in bulb and D2. 5 mm sessile polyp in bulb.   ESOPHAGOGASTRODUODENOSCOPY (EGD) WITH PROPOFOL N/A 09/06/2019   Procedure: ESOPHAGOGASTRODUODENOSCOPY (EGD) WITH PROPOFOL;  Surgeon: Iva Boop, MD;  Location: [facility] ENDOSCOPY;  Service: Endoscopy;  Laterality: N/A;   ESOPHAGOGASTRODUODENOSCOPY (EGD) WITH PROPOFOL N/A 02/02/2020   Procedure: ESOPHAGOGASTRODUODENOSCOPY (EGD) WITH PROPOFOL;  Surgeon: Rachael Fee, MD;  Location: [facility] ENDOSCOPY;  Service: Endoscopy;  Laterality: N/A;   ESOPHAGOGASTRODUODENOSCOPY (EGD) WITH PROPOFOL N/A 02/07/2021   Procedure: ESOPHAGOGASTRODUODENOSCOPY (EGD) WITH PROPOFOL;  Surgeon: Rachael Fee, MD;  Location: [facility] ENDOSCOPY;  Service: Endoscopy;  Laterality: N/A;   ESOPHAGOGASTRODUODENOSCOPY (EGD) WITH PROPOFOL N/A 03/13/2023   Procedure: ESOPHAGOGASTRODUODENOSCOPY (EGD) WITH PROPOFOL;  Surgeon: Shellia Cleverly, DO;  Location: [facility] ENDOSCOPY;  Service: Gastroenterology;  Laterality: N/A;   EUS N/A 02/02/2020    Procedure: UPPER ENDOSCOPIC ULTRASOUND (EUS) RADIAL;  Surgeon: Rachael Fee, MD;  Location: [facility] ENDOSCOPY;  Service: Endoscopy;  Laterality: N/A;   EXCHANGE OF A DIALYSIS CATHETER Left 07/31/2017   Procedure: Removal  OF A  Right GroinTUNNELED  DIALYSIS CATHETER ,  Insertion of Left Femoral Dialysis Catheter.;  Surgeon: Larina Earthly, MD;  Location: [facility] OR;  Service: Vascular;  Laterality: Left;   HEMOSTASIS CLIP PLACEMENT  02/07/2021   Procedure: HEMOSTASIS CLIP PLACEMENT;  Surgeon: Rachael Fee, MD;  Location: [facility] ENDOSCOPY;  Service: Endoscopy;;   HEMOSTASIS CLIP PLACEMENT  10/22/2022   Procedure: HEMOSTASIS CLIP PLACEMENT;  Surgeon: Sherrilyn Rist, MD;  Location: [facility] ENDOSCOPY;  Service: Gastroenterology;;   HEMOSTASIS CLIP PLACEMENT  03/13/2023   Procedure: HEMOSTASIS CLIP PLACEMENT;  Surgeon: Shellia Cleverly, DO;  Location: [facility] ENDOSCOPY;  Service: Gastroenterology;;   HOT HEMOSTASIS N/A 09/06/2019   Procedure: HOT HEMOSTASIS (ARGON PLASMA COAGULATION/BICAP);  Surgeon: Iva Boop, MD;  Location: [facility] ENDOSCOPY;  Service: Endoscopy;  Laterality: N/A;   HOT HEMOSTASIS N/A 02/07/2021   Procedure: HOT HEMOSTASIS (ARGON PLASMA COAGULATION/BICAP);  Surgeon: Rachael Fee, MD;  Location: [facility] ENDOSCOPY;  Service: Endoscopy;  Laterality: N/A;   HOT HEMOSTASIS N/A 10/22/2022   Procedure: HOT HEMOSTASIS (ARGON PLASMA COAGULATION/BICAP);  Surgeon: Sherrilyn Rist, MD;  Location: [facility] ENDOSCOPY;  Service:  Gastroenterology;  Laterality: N/A;   INSERTION OF DIALYSIS CATHETER Right 06/16/2017   Procedure: INSERTION OF DIALYSIS CATHETER;  Surgeon: Fransisco Hertz, MD;  Location: [facility] OR;  Service: Vascular;  Laterality: Right;   IR AV DIALY SHUNT INTRO NEEDLE/INTRACATH INITIAL W/PTA/IMG LEFT  06/21/2018   POLYPECTOMY  03/13/2023   Procedure: POLYPECTOMY;  Surgeon: Shellia Cleverly, DO;  Location: [facility] ENDOSCOPY;  Service: Gastroenterology;;   REPAIR OF PERFORATED ULCER  1990   gastric ulcer   SCLEROTHERAPY   10/22/2022   Procedure: SCLEROTHERAPY;  Surgeon: Sherrilyn Rist, MD;  Location: [facility] ENDOSCOPY;  Service: Gastroenterology;;   SUBMUCOSAL TATTOO INJECTION  10/22/2022   Procedure: SUBMUCOSAL TATTOO INJECTION;  Surgeon: Sherrilyn Rist, MD;  Location: [facility] ENDOSCOPY;  Service: Gastroenterology;;    Prior to Admission medications   Medication Sig Start Date End Date Taking? Authorizing Provider  amLODipine (NORVASC) 10 MG tablet Take 1 tablet (10 mg total) by mouth daily. Patient taking differently: Take 10 mg by mouth daily at 12 noon. 11/11/16  Yes Pincus Large, DO  CREON 12000-38000 units CPEP capsule Take 4 capsules by mouth 2 (two) times daily with a meal. 03/02/23  Yes [provider]  gabapentin (NEURONTIN) 100 MG capsule Take 200 mg by mouth at bedtime.   Yes [provider]  hydrALAZINE (APRESOLINE) 25 MG tablet Take 3 tablets (75 mg total) by mouth every 8 (eight) hours. 11/14/20 03/09/24 Yes [place], Eric J, DO  sevelamer carbonate (RENVELA) 800 MG tablet Take 800 mg by mouth 3 (three) times daily with meals. 07/23/20  Yes [provider]  sucralfate (CARAFATE) 1 g tablet Take 1 tablet (1 g total) by mouth 4 (four) times daily -  with meals and at bedtime. 05/16/23  Yes Roxy Horseman, PA-C  oxyCODONE-acetaminophen (PERCOCET) 5-325 MG tablet Take 1-2 tablets by mouth every 6 (six) hours as needed. Patient not taking: Reported on 05/21/2023 05/16/23   Roxy Horseman, PA-C  pantoprazole (PROTONIX) 40 MG tablet Take 1 tablet (40 mg total) by mouth daily. 03/05/23 06/03/23  Barrett, Horald Chestnut, PA-C    Current Facility-Administered Medications  Medication Dose Route Frequency Provider Last Rate Last Admin   (feeding supplement) PROSource Plus liquid 30 mL  30 mL Oral BID BM Julien Nordmann, PA-C   30 mL at 05/26/23 0845   acetaminophen (TYLENOL) tablet 650 mg  650 mg Oral Q6H PRN Howerter, Justin B, DO   650 mg at 05/23/23 0935   Or   acetaminophen (TYLENOL)  suppository 650 mg  650 mg Rectal Q6H PRN Howerter, Justin B, DO       amLODipine (NORVASC) tablet 10 mg  10 mg Oral Q1200 Russella Dar, NP   10 mg at 05/27/23 1244   Chlorhexidine Gluconate Cloth 2 % PADS 6 each  6 each Topical Q0600 Delano Metz, MD   6 each at 05/27/23 0507   gabapentin (NEURONTIN) capsule 200 mg  200 mg Oral QHS Russella Dar, NP   200 mg at 05/26/23 2126   heparin injection 5,000 Units  5,000 Units Subcutaneous Q8H Russella Dar, NP   5,000 Units at 05/27/23 0507   hydrALAZINE (APRESOLINE) tablet 75 mg  75 mg Oral Q8H Russella Dar, NP   75 mg at 05/26/23 2126   lipase/protease/amylase (CREON) capsule 48,000 Units  48,000 Units Oral BID WC Russella Dar, NP   48,000 Units at 05/26/23 1732   melatonin tablet 3 mg  3 mg Oral  QHS PRN Howerter, Jill Alexanders B, DO       naloxone (NARCAN) injection 0.4 mg  0.4 mg Intravenous PRN Howerter, Justin B, DO       ondansetron (ZOFRAN) injection 4 mg  4 mg Intravenous Q6H PRN Howerter, Justin B, DO       oxyCODONE (Oxy IR/ROXICODONE) immediate release tablet 5 mg  5 mg Oral Q4H PRN Elgergawy, Leana Roe, MD   5 mg at 05/27/23 1027   pantoprazole (PROTONIX) EC tablet 40 mg  40 mg Oral BID Mosetta Anis, RPH   40 mg at 05/27/23 1244   promethazine (PHENERGAN) 12.5 mg in sodium chloride 0.9 % 50 mL IVPB  12.5 mg Intravenous Q6H PRN Howerter, Justin B, DO       sevelamer carbonate (RENVELA) tablet 800 mg  800 mg Oral TID WC Russella Dar, NP   800 mg at 05/27/23 1244    Allergies as of 05/20/2023 - Review Complete 05/20/2023  Allergen Reaction Noted   Aspirin Nausea And Vomiting 07/18/2009   Ibuprofen Nausea And Vomiting 12/24/2009    Family History  Problem Relation Age of Onset   Hypertension Father    Cancer Father    Hyperlipidemia Father    Seizures Sister    Early death Daughter    Kidney disease Daughter        end stage dialysis dependent    Breast cancer Maternal Aunt     Social History    Socioeconomic History   Marital status: Widowed    Spouse name: Not on file   Number of children: 3   Years of education: Not on file   Highest education level: Not on file  Occupational History   Occupation: retired  Tobacco Use   Smoking status: Every Day    Current packs/day: 0.25    Average packs/day: 0.3 packs/day for 40.0 years (10.0 ttl pk-yrs)    Types: Cigarettes   Smokeless tobacco: Never  Vaping Use   Vaping status: Never Used  Substance and Sexual Activity   Alcohol use: No    Alcohol/week: 0.0 standard drinks of alcohol   Drug use: Not Currently    Types: Heroin, Marijuana, Cocaine    Comment: hasn't used cocaine in 1-2 years; she smokes marijuana daily, "whenever I can get it"   Sexual activity: Never  Other Topics Concern   Not on file  Social History Narrative   Not on file   Social Drivers of Health   Financial Resource Strain: Not on file  Food Insecurity: No Food Insecurity (05/21/2023)   Hunger Vital Sign    Worried About Running Out of Food in the Last Year: Never true    Ran Out of Food in the Last Year: Never true  Transportation Needs: No Transportation Needs (05/21/2023)   PRAPARE - Administrator, Civil Service (Medical): No    Lack of Transportation (Non-Medical): No  Physical Activity: Not on file  Stress: Not on file  Social Connections: Not on file  Intimate Partner Violence: Not At Risk (05/21/2023)   Humiliation, Afraid, Rape, and Kick questionnaire    Fear of Current or Ex-Partner: No    Emotionally Abused: No    Physically Abused: No    Sexually Abused: No    Review of Systems: Pertinent positive and negative review of systems were noted in the above HPI section.  All other review of systems was otherwise negative.   Physical Exam: Vital signs in last 24 hours: Temp:  [  97.5 F (36.4 C)-99.1 F (37.3 C)] 99.1 F (37.3 C) (12/18 1212) Pulse Rate:  [67-92] 90 (12/18 1212) Resp:  [11-19] 19 (12/18 1212) BP:  (133-167)/(51-92) 148/74 (12/18 1212) SpO2:  [91 %-98 %] 93 % (12/18 1212) Weight:  [46.5 kg-50.8 kg] 46.5 kg (12/18 1129) Last BM Date : 05/26/23 General:   Alert,  Well-developed, well-nourished, elderly African-American female pleasant and cooperative in NAD, sitting up in chair Head:  Normocephalic and atraumatic. Eyes:  Sclera clear, no icterus.   Conjunctiva pink. Ears:  Normal auditory acuity. Nose:  No deformity, discharge,  or lesions. Mouth:  No deformity or lesions.   Neck:  Supple; no masses or thyromegaly. Lungs:  Clear throughout to auscultation.   No wheezes, crackles, or rhonchi.  Heart:  Regular rate and rhythm; no murmurs, clicks, rubs,  or gallops. Abdomen:  Soft, mildly tender in the hypogastrium no guarding or rebound, BS active,nonpalp mass or hsm.   Rectal: Not done Msk:  Symmetrical without gross deformities. . Pulses:  Normal pulses noted. Extremities:  Without clubbing or edema. Neurologic:  Alert and  oriented x4;  grossly normal neurologically. Skin:  Intact without significant lesions or rashes.. Psych:  Alert and cooperative. Normal mood and affect.  Intake/Output from previous day: No intake/output data recorded. Intake/Output this shift: Total I/O In: -  Out: 2000 [Other:2000]  Lab Results: Recent Labs    05/25/23 0506 05/27/23 0437  WBC 6.1 5.8  HGB 11.7* 11.4*  HCT 36.9 35.4*  PLT 247 237   BMET Recent Labs    05/25/23 0506 05/27/23 0437  NA 133* 128*  K 4.5 4.7  CL 97* 92*  CO2 22 19*  GLUCOSE 86 74  BUN 42* 37*  CREATININE 11.68* 8.77*  CALCIUM 9.2 9.3   LFT Recent Labs    05/26/23 1358  PROT 6.2*  ALBUMIN 2.7*  AST 36  ALT 15  ALKPHOS 53  BILITOT 0.6  BILIDIR <0.1  IBILI NOT CALCULATED   PT/INR No results for input(s): "LABPROT", "INR" in the last 72 hours. Hepatitis Panel No results for input(s): "HEPBSAG", "HCVAB", "HEPAIGM", "HEPBIGM" in the last 72 hours.    IMPRESSION:  #84 72 year old female with  end-stage renal disease, on dialysis admitted 1 week ago with complaints of abdominal pain which had been progressive over about a week prior to that and associated with mild diarrhea and nausea Initial diagnosis was a gastroenteritis after noncontrasted CT scan had shown some thickened small bowel loops in the left abdomen  Due to persistent complaints of pain she had a contrasted CT done yesterday and it does not show any evidence of thickened bowel loops, or obstruction, she has noted to have diffuse dilation of the pancreatic duct to 7 mm in the pancreatic head and neck, no ductal calculi or pancreatic calcifications, no obstructive lesion or inflammatory changes noted  She does have a prior diagnosis of chronic pancreatitis with pancreatic exocrine insufficiency for which she is on Creon  She feels that her current pain is most consistent with her pancreas pain, is hard to discern how much pain she is having from the chronic pancreatitis she is not having difficulty eating   #2 prior history of CVA #3.  History of hep C treated #4.  Remote history of perforated gastric ulcer #5.  History of duodenal AVMs most recent EGD October 2024 with mild gastritis #6.  History of adenomatous colon polyps-just had colonoscopy October 2024 #7 history of substance abuse   PLAN: Will  proceed with MRI/MRCP, to assure no subtle pancreatic head lesion Continue Creon 48,002 p.o. with meals Will follow-up tomorrow, post MRI/MRCP    Yanice Maqueda EsterwoodPA-C  05/27/2023, 1:14 PM

## 2023-05-27 NOTE — Plan of Care (Signed)
  Problem: Clinical Measurements: Goal: Will remain free from infection Outcome: Progressing Goal: Respiratory complications will improve Outcome: Progressing   Problem: Activity: Goal: Risk for activity intolerance will decrease Outcome: Progressing   Problem: Elimination: Goal: Will not experience complications related to bowel motility Outcome: Progressing   Problem: Pain Management: Goal: General experience of comfort will improve Outcome: Progressing   Problem: Safety: Goal: Ability to remain free from injury will improve Outcome: Progressing   Problem: Skin Integrity: Goal: Risk for impaired skin integrity will decrease Outcome: Progressing

## 2023-05-28 ENCOUNTER — Telehealth: Payer: Self-pay

## 2023-05-28 DIAGNOSIS — K861 Other chronic pancreatitis: Secondary | ICD-10-CM

## 2023-05-28 DIAGNOSIS — R1084 Generalized abdominal pain: Secondary | ICD-10-CM | POA: Diagnosis not present

## 2023-05-28 DIAGNOSIS — R197 Diarrhea, unspecified: Secondary | ICD-10-CM

## 2023-05-28 DIAGNOSIS — I10 Essential (primary) hypertension: Secondary | ICD-10-CM | POA: Diagnosis not present

## 2023-05-28 DIAGNOSIS — N186 End stage renal disease: Secondary | ICD-10-CM | POA: Diagnosis not present

## 2023-05-28 LAB — RENAL FUNCTION PANEL
Albumin: 2.8 g/dL — ABNORMAL LOW (ref 3.5–5.0)
Anion gap: 15 (ref 5–15)
BUN: 25 mg/dL — ABNORMAL HIGH (ref 8–23)
CO2: 23 mmol/L (ref 22–32)
Calcium: 9.2 mg/dL (ref 8.9–10.3)
Chloride: 92 mmol/L — ABNORMAL LOW (ref 98–111)
Creatinine, Ser: 5.83 mg/dL — ABNORMAL HIGH (ref 0.44–1.00)
GFR, Estimated: 7 mL/min — ABNORMAL LOW (ref >60)
Glucose, Bld: 161 mg/dL — ABNORMAL HIGH (ref 70–99)
Phosphorus: 4.2 mg/dL (ref 2.5–4.6)
Potassium: 3.9 mmol/L (ref 3.5–5.1)
Sodium: 130 mmol/L — ABNORMAL LOW (ref 135–145)

## 2023-05-28 MED ORDER — OXYCODONE HCL 5 MG PO TABS: 5.0000 mg | ORAL_TABLET | Freq: Four times a day (QID) | ORAL | 0 refills | Status: DC | PRN

## 2023-05-28 NOTE — Telephone Encounter (Signed)
Pt scheduled to see Hyacinth Meeker PA 06/25/23 at 11:30am. Appt letter mailed to pt.

## 2023-05-28 NOTE — Telephone Encounter (Signed)
-----   Message from Carie Caddy Pyrtle sent at 05/28/2023 12:00 PM EST ----- GM On your return, can you please consider EUS for this pt with hx of pancreatitis and now abnormal PD and possible stricture versus occult lesion in panc head Hospitalized but going home soon May need stent perhaps? Thanks for your help. Theodoro Clock, Please get this pt and office visit with me or APP in 4 weeks (if possible) for hospital follow-up pancreatitis and also she will need EUS.  Thanks all,  HBD GM JMP

## 2023-05-28 NOTE — Discharge Planning (Signed)
[facility] Patient Discharge Orders - [facility] CLINIC: [facility]  Patient's name: Ashley [name] Admit/DC Dates: 05/20/2023 - 05/28/23  DISCHARGE DIAGNOSES: Abdominal pain/acute on chronic pancreatitis - MRCP with pancreatic duct narrowing -> for outpatient EUS with Dr. Rhea Belton (GI)  Diarrhea -> resolved  HD ORDER CHANGES: Heparin change: no EDW Change: YES New EDW: 46kg [place] Change: no  ANEMIA MANAGEMENT: Aranesp: Given: no  ESA dose for discharge: per protocol IV Iron dose at discharge: D/c for now - MRI showed liver iron deposition. Transfusion: Given: no  BONE/MINERAL MEDICATIONS: Hectorol/Calcitriol change: per protocol Sensipar/Parsabiv change: no  ACCESS INTERVENTION/CHANGE: no Details:  RECENT LABS: Recent Labs  Lab 05/27/23 0437 05/28/23 0856  HGB 11.4*  --   NA 128* 130*  K 4.7 3.9  CALCIUM 9.3 9.2  PHOS  --  4.2  ALBUMIN  --  2.8*   IV ANTIBIOTICS: no Details:  OTHER ANTICOAGULATION: On Coumadin?: no  OTHER/APPTS/LAB ORDERS:  - Needs f/u with GI (Dr. Rhea Belton) for endoscopic [place] to assess tapering pancreatic duct on MRCP  D/C Meds to be reconciled by nurse after every discharge.  Completed By: Ozzie Hoyle, PA-C [facility] Pager [phone]   Reviewed by: MD:______ RN_______

## 2023-05-28 NOTE — TOC Transition Note (Signed)
Transition of Care [facility]) - Discharge Note   Patient Details  Name: Ashley [name] MRN: [id number] Date of Birth: 07-13-1950  [facility] CM/SW Contact:  Gordy Clement, RN Phone Number: 05/28/2023, 12:34 PM   Clinical Narrative:     Patient to DC today. [employer] to deliver Rollator prior to DC. Iantha Fallen will provide PT and oT  Daughter will transport           Patient Goals and CMS Choice            Discharge Placement                       Discharge Plan and Services Additional resources added to the After Visit Summary for                                       Social Drivers of Health (SDOH) Interventions SDOH Screenings   Food Insecurity: No Food Insecurity (05/21/2023)  Housing: Low Risk  (05/21/2023)  Transportation Needs: No Transportation Needs (05/21/2023)  Utilities: Not At Risk (05/21/2023)  Depression (PHQ2-9): Low Risk  (04/06/2019)  Recent Concern: Depression (PHQ2-9) - Medium Risk (04/03/2019)  Tobacco Use: High Risk (05/20/2023)     Readmission Risk Interventions    03/10/2023    2:46 PM  Readmission Risk Prevention Plan  Transportation Screening Complete  PCP or Specialist Appt within 3-5 Days Complete  HRI or Home Care Consult Complete  Palliative Care Screening Not Applicable  Medication Review (RN Care Manager) Referral to Pharmacy

## 2023-05-28 NOTE — Progress Notes (Signed)
Occupational Therapy Treatment Patient Details Name: Ashley [name] MRN: [id number] DOB: 05/10/1951 Today's Date: 05/28/2023   History of present illness 72 yo female presenting to [facility] on 05/20/23  via EMS from home after complaining of generalized abdominal pain for 2 weeks associated with diarrhea worse after eating. CT showed possible acute gastroenteritis. PMH including hypertension, pancreatitis, HLD, ESRD on hemodialysis, CVA, polysubstance abuse.   OT comments  Pt receptive to hall mobility with some encouragement, she remains unsteady without DME support so she opted to use RW for gait. She has decreased ability to maneuver the device and would benefit from more practice. Encouraged pt to continue with mobility as she remains generally weak and has been refusing longer distance ambulation. Updated recs to [facility] to help pt progress at DC as she is not near her baseline and needs some more conditioning.       If plan is discharge home, recommend the following:  A little help with walking and/or transfers;Assistance with cooking/housework;Help with stairs or ramp for entrance   Equipment Recommendations  Other (comment) (Rollator if pt does not already have one)    Recommendations for Other Services      Precautions / Restrictions Precautions Precautions: None Restrictions Weight Bearing Restrictions Per Provider Order: No       Mobility Bed Mobility               General bed mobility comments: up in recliner on arrival, left in recliner at end of session    Transfers Overall transfer level: Needs assistance Equipment used: Rolling walker (2 wheels) Transfers: Sit to/from Stand Sit to Stand: Contact guard assist           General transfer comment: increased time needed to rise, HHA from OT. Pt took 5 steps and looked unsteady, she was receptive to using RW due to poor balance but has decreased knowledge of RW use/maneuverability     Balance Overall balance  assessment: Needs assistance Sitting-balance support: No upper extremity supported, Feet supported Sitting balance-Leahy Scale: Good     Standing balance support: No upper extremity supported, During functional activity Standing balance-Leahy Scale: Fair Standing balance comment: static standing is fair                           ADL either performed or assessed with clinical judgement   ADL                                       Functional mobility during ADLs: Contact guard assist;Rolling walker (2 wheels) General ADL Comments: Pt declined need for ADLs, focuses on OOB mobility and balance for functional activities.    Extremity/Trunk Assessment              Vision       Perception     Praxis      Cognition Arousal: Alert Behavior During Therapy: WFL for tasks assessed/performed Overall Cognitive Status: Within Functional Limits for tasks assessed                                          Exercises      Shoulder Instructions       General Comments VSS    Pertinent Vitals/ Pain  Pain Assessment Pain Assessment: Faces Faces Pain Scale: Hurts a little bit Pain Location: abdomen Pain Descriptors / Indicators: Sore Pain Intervention(s): RN gave pain meds during session, Limited activity within patient's tolerance, Monitored during session  Home Living                                          Prior Functioning/Environment              Frequency  Min 1X/week        Progress Toward Goals  OT Goals(current goals can now be found in the care plan section)  Progress towards OT goals: Progressing toward goals  Acute Rehab OT Goals Patient Stated Goal: To go home OT Goal Formulation: With patient Time For Goal Achievement: 06/06/23 Potential to Achieve Goals: Good  Plan      Co-evaluation                 AM-PAC OT "6 Clicks" Daily Activity     Outcome Measure   Help  from another person eating meals?: None Help from another person taking care of personal grooming?: A Little Help from another person toileting, which includes using toliet, bedpan, or urinal?: A Little Help from another person bathing (including washing, rinsing, drying)?: A Little Help from another person to put on and taking off regular upper body clothing?: A Little Help from another person to put on and taking off regular lower body clothing?: A Little 6 Click Score: 19    End of Session Equipment Utilized During Treatment: Rolling walker (2 wheels)  OT Visit Diagnosis: Unsteadiness on feet (R26.81);Other abnormalities of gait and mobility (R26.89);Pain   Activity Tolerance Patient tolerated treatment well   Patient Left in chair;with call bell/phone within reach   Nurse Communication Mobility status        Time: 1055-1105 OT Time Calculation (min): 10 min  Charges: OT General Charges $OT Visit: 1 Visit OT Treatments $Therapeutic Activity: 8-22 mins  05/28/2023  AB, OTR/L  Acute Rehabilitation Services  Office: [phone]   Tristan Schroeder 05/28/2023, 12:58 PM

## 2023-05-28 NOTE — TOC Progression Note (Signed)
Transition of Care [facility]) - Progression Note    Patient Details  Name: Ashley [name] MRN: [id number] Date of Birth: [birth year]  [facility] CM/SW Contact  Gordy Clement, RN Phone Number: 05/28/2023, 11:50 AM  Clinical Narrative:    Patient has been recommended a rollator  [employer] will deliver bedside prior to DC.   Patient states she believes she would benefit for PT and OT, post hospitalization. Iantha Fallen has worked with patient in the past and will provide services. Patient states her Daughter will transport her home at DC. AVS updated           Expected Discharge Plan and Services                                               Social Determinants of Health (SDOH) Interventions SDOH Screenings   Food Insecurity: No Food Insecurity (05/21/2023)  Housing: Low Risk  (05/21/2023)  Transportation Needs: No Transportation Needs (05/21/2023)  Utilities: Not At Risk (05/21/2023)  Depression (PHQ2-9): Low Risk  (04/06/2019)  Recent Concern: Depression (PHQ2-9) - Medium Risk (04/03/2019)  Tobacco Use: High Risk (05/20/2023)    Readmission Risk Interventions    03/10/2023    2:46 PM  Readmission Risk Prevention Plan  Transportation Screening Complete  PCP or Specialist Appt within 3-5 Days Complete  HRI or Home Care Consult Complete  Palliative Care Screening Not Applicable  Medication Review (RN Care Manager) Referral to Pharmacy

## 2023-05-28 NOTE — Plan of Care (Signed)
  Problem: Education: Goal: Knowledge of General Education information will improve Description: Including pain rating scale, medication(s)/side effects and non-pharmacologic comfort measures Outcome: Completed/Met   Problem: Health Behavior/Discharge Planning: Goal: Ability to manage health-related needs will improve Outcome: Completed/Met   Problem: Clinical Measurements: Goal: Ability to maintain clinical measurements within normal limits will improve Outcome: Completed/Met Goal: Will remain free from infection Outcome: Completed/Met Goal: Diagnostic test results will improve Outcome: Completed/Met Goal: Respiratory complications will improve Outcome: Completed/Met Goal: Cardiovascular complication will be avoided Outcome: Completed/Met   Problem: Activity: Goal: Risk for activity intolerance will decrease Outcome: Completed/Met   Problem: Nutrition: Goal: Adequate nutrition will be maintained Outcome: Completed/Met   Problem: Coping: Goal: Level of anxiety will decrease Outcome: Completed/Met   Problem: Elimination: Goal: Will not experience complications related to bowel motility Outcome: Completed/Met Goal: Will not experience complications related to urinary retention Outcome: Completed/Met   Problem: Pain Management: Goal: General experience of comfort will improve Outcome: Completed/Met   Problem: Safety: Goal: Ability to remain free from injury will improve Outcome: Completed/Met   Problem: Skin Integrity: Goal: Risk for impaired skin integrity will decrease Outcome: Completed/Met   Problem: Nutrition Goal: Patient maintains adequate hydration Outcome: Completed/Met

## 2023-05-28 NOTE — Discharge Summary (Addendum)
PATIENT DETAILS Name: Ashley [name] Age: 72 y.o. Sex: female Date of Birth: 1951/05/24 MRN: [id number]. Admitting Physician: Angie Fava, DO NWG:[id number], Marlynn Perking., FNP  Admit Date: 05/20/2023 Discharge date: 05/28/2023  Recommendations for Outpatient Follow-up:  Follow up with PCP in 1-2 weeks Please obtain CMP/CBC in one week Please ensure follow-up with gastroenterology for outpatient EUS  Admitted From:  Home  Disposition: Home   Discharge Condition: good  CODE STATUS:   Code Status: Full Code   Diet recommendation:  Diet Order             Diet - low sodium heart healthy           Diet renal with fluid restriction Fluid restriction: 1200 mL Fluid; Room service appropriate? Yes; Fluid consistency: Thin  Diet effective now                    Brief Summary: Patient is a 72 y.o.  female with history of ESRD on HD MWF, chronic pancreatitis, HTN, HLD, tobacco use-who presented with 2-week history  of diarrhea/abdominal pain.   Significant events: 12/11>> admit to [facility]   Significant studies: 12/11>> CT abdomen/pelvis: No acute abnormality 12/17>> CT abdomen/pelvis: Diffuse dilatation of the pancreatic duct-7 mm. 12/18>> MRCP: Pancreatic neoplasm cannot be excluded (rapid tapering of the duct in the head of pancreas with string sign)   Significant microbiology data: None   Procedures: None   Consults: Nephrology GI  Brief Hospital Course: Gastroenteritis Resolved-tolerating advancement in diet Completed 5 days of empiric Rocephin/Flagyl   Acute on chronic pancreatitis Continues to have abdominal pain-which is easily relieved by oral narcotics-tolerating diet. Underwent further workup with a repeat CT which showed dilated main pancreatic duct-this was followed by a MRCP on 12/18-some concern that she may have a potential pancreatic lesion on MRCP-although a limited study due to artifacts. Case discussed with GI MD-Dr. Vena Austria office will  arrange for an outpatient EUS-okay to discharge today. Will provide the patient a few day supply of oral oxycodone Continue Creon on discharge   Hyperkalemia Resolved   ESRD on HD MWF Nephrology followed closely-resume outpatient dialysis as previous.   Hyponatremia Improved with HD-mild-asymptomatic.   HTN Stable Amlodipine   GERD PPI On long-term Carafate-which has been discontinued due to ESRD status   Tobacco abuse Transdermal nicotine   Underweight: Estimated body mass index is 18.51 kg/m as calculated from the following:   Height as of this encounter: 5\' 2"  (1.575 m).   Weight as of this encounter: 45.9 k  Discharge Diagnoses:  Principal Problem:   Abdominal pain   Discharge Instructions:  Activity:  As tolerated  Discharge Instructions     Call MD for:  difficulty breathing, headache or visual disturbances   Complete by: As directed    Call MD for:  redness, tenderness, or signs of infection (pain, swelling, redness, odor or green/yellow discharge around incision site)   Complete by: As directed    Diet - low sodium heart healthy   Complete by: As directed    Discharge instructions   Complete by: As directed    Follow with Primary MD  Raymon Mutton., FNP in 1-2 weeks  Gastroenterology office will call you with a follow-up appointment-if you do not hear from them-please give them a call  Follow-up with hemodialysis clinic at your usual schedule.  Please get a complete blood count and chemistry panel checked by your Primary MD at your next visit, and  again as instructed by your Primary MD.  Get Medicines reviewed and adjusted: Please take all your medications with you for your next visit with your Primary MD  Laboratory/radiological data: Please request your Primary MD to go over all hospital tests and procedure/radiological results at the follow up, please ask your Primary MD to get all Hospital records sent to his/her office.  In some cases,  they will be blood work, cultures and biopsy results pending at the time of your discharge. Please request that your primary care M.D. follows up on these results.  Also Note the following: If you experience worsening of your admission symptoms, develop shortness of breath, life threatening emergency, suicidal or homicidal thoughts you must seek medical attention immediately by calling 911 or calling your MD immediately  if symptoms less severe.  You must read complete instructions/literature along with all the possible adverse reactions/side effects for all the Medicines you take and that have been prescribed to you. Take any new Medicines after you have completely understood and accpet all the possible adverse reactions/side effects.   Do not drive when taking Pain medications or sleeping medications (Benzodaizepines)  Do not take more than prescribed Pain, Sleep and Anxiety Medications. It is not advisable to combine anxiety,sleep and pain medications without talking with your primary care practitioner  Special Instructions: If you have smoked or chewed Tobacco  in the last 2 yrs please stop smoking, stop any regular Alcohol  and or any Recreational drug use.  Wear Seat belts while driving.  Please note: You were cared for by a hospitalist during your hospital stay. Once you are discharged, your primary care physician will handle any further medical issues. Please note that NO REFILLS for any discharge medications will be authorized once you are discharged, as it is imperative that you return to your primary care physician (or establish a relationship with a primary care physician if you do not have one) for your post hospital discharge needs so that they can reassess your need for medications and monitor your lab values.   Increase activity slowly   Complete by: As directed       Allergies as of 05/28/2023       Reactions   Aspirin Nausea And Vomiting   Stomach ache   Ibuprofen Nausea  And Vomiting   Stomach ache        Medication List     STOP taking these medications    oxyCODONE-acetaminophen 5-325 MG tablet Commonly known as: Percocet   sucralfate 1 g tablet Commonly known as: Carafate       TAKE these medications    amLODipine 10 MG tablet Commonly known as: NORVASC Take 1 tablet (10 mg total) by mouth daily. What changed: when to take this   Creon 12000-38000 units Cpep capsule Generic drug: lipase/protease/amylase Take 4 capsules by mouth 2 (two) times daily with a meal.   gabapentin 100 MG capsule Commonly known as: NEURONTIN Take 200 mg by mouth at bedtime.   hydrALAZINE 25 MG tablet Commonly known as: APRESOLINE Take 3 tablets (75 mg total) by mouth every 8 (eight) hours.   oxyCODONE 5 MG immediate release tablet Commonly known as: Oxy IR/ROXICODONE Take 1 tablet (5 mg total) by mouth every 6 (six) hours as needed for severe pain (pain score 7-10).   pantoprazole 40 MG tablet Commonly known as: PROTONIX Take 1 tablet (40 mg total) by mouth daily.   sevelamer carbonate 800 MG tablet Commonly known as: RENVELA Take 800  mg by mouth 3 (three) times daily with meals.               Durable Medical Equipment  (From admission, onward)           Start     Ordered   05/28/23 1146  For home use only DME 4 wheeled rolling walker with seat  Once       Question Answer Comment  Patient needs a walker to treat with the following condition Chronic pancreatitis [facility])   Patient needs a walker to treat with the following condition ESRD (end stage renal disease) (HCC)      05/28/23 1146            Follow-up Information     [facility] Follow up.   Why: Iantha Fallen will contact you within 48 hours of DC to home to arrange a home visit for physical and occupational therapy Contact information: [address] [place] [address] [phone]         [facility] Follow up.   Why:  [facility] has provided you with the rolling walker Contact information: [id number]  [place], [place]  [address]  [phone]        Raymon Mutton., FNP. Schedule an appointment as soon as possible for a visit in 1 week(s).   Specialty: Family Medicine Contact information: [address] [place] [address] [phone]         Beverley Fiedler, MD Follow up.   Specialty: Gastroenterology Why: Office will call with date/time, If you dont hear from them,please give them a call Contact information: [address] [place] [address] [phone]                Allergies  Allergen Reactions   Aspirin Nausea And Vomiting    Stomach ache   Ibuprofen Nausea And Vomiting    Stomach ache     Other Procedures/Studies: MR 3D Recon At Scanner Result Date: 05/28/2023 CLINICAL DATA:  Pancreatitis. Main pancreatic ductal dilatation on CT imaging. EXAM: MRI ABDOMEN WITHOUT CONTRAST  (INCLUDING MRCP) TECHNIQUE: Multiplanar multisequence MR imaging of the abdomen was performed. Heavily T2-weighted images of the biliary and pancreatic ducts were obtained, and three-dimensional MRCP images were rendered by post processing. COMPARISON:  CT scan 05/26/2023 FINDINGS: Lower chest: Atelectasis noted dependent left lung base with possible tiny left pleural effusion. Hepatobiliary: No focal abnormality identified in the liver on this study performed without intravenous contrast material. Diffuse low signal intensity in liver parenchyma on T2 weighted imaging suggests iron deposition. Tiny stones are seen in the gallbladder. No intrahepatic biliary duct dilatation. Common duct measures approximately 5 mm diameter. Common bile duct measures maximally at 6 mm diameter. No findings to suggest choledocholithiasis. Pancreas: As noted on CT imaging, there is diffuse distention of the main pancreatic duct through the body and tail of pancreas measuring up to 7 mm diameter in the  pancreatic body. There is relatively rapid tapering of the main duct in the head of the pancreas with only a string sign of duct visible (image 22/3). Some reconstitution of the main duct into the ampulla is evident although duct in this region is nondilated. Assessment of the pancreas is limited by motion artifact and lack of intravenous contrast. Additionally, there are surgical clips and potentially a hemostatic clip in the antral region of the stomach generating substantial susceptibility artifact obscuring portions of the pancreatic head on some pulse sequences. Within  this limitation, the does appears to be some loss of parenchymal architecture in the head of the pancreas in the region of pancreatic ductal narrowing (see axial image 25 of series 3). Spleen: Some diffuse low signal intensity in the splenic parenchyma on T2 imaging suggests iron deposition. Tiny anterior T2 hyperintensity may be a cyst or pseudocyst. Adrenals/Urinary Tract: Thickening of both adrenal glands evident without a discrete nodule or mass. Kidneys are atrophic with innumerable tiny cortical T2 hyperintensities, too small to characterize but likely benign and probably cysts. 17 mm cyst noted lower interpolar right kidney with layering calcific or proteinaceous debris. No hydronephrosis on today's study. Stomach/Bowel: Stomach is decompressed with diffuse wall thickening, likely accentuated by the decompressed state. No evidence for small bowel obstruction. Vascular/Lymphatic: No abdominal aortic aneurysm. No abdominal lymphadenopathy. Other:  No substantial intraperitoneal free fluid. Musculoskeletal: No suspicious focal marrow signal abnormality. IMPRESSION: 1. Diffuse distention of the main pancreatic duct through the body and tail of pancreas measuring up to 7 mm diameter in the pancreatic body. There is relatively rapid tapering of the main duct in the head of the pancreas with only a string sign of duct visible. Some reconstitution  of the main duct into the ampulla is evident although duct in this region is nondilated. Assessment of the pancreas is limited by motion artifact and lack of intravenous contrast. Additionally, there are surgical clips and potentially a hemostatic clip in the antral region of the stomach generating substantial susceptibility artifact obscuring portions of the pancreatic head on some pulse sequences. Within this limitation, there does appears to be some loss of parenchymal architecture in the head of the pancreas in the region of pancreatic ductal narrowing. As such, pancreatic lesion/neoplasm cannot be excluded. Consider endoscopic ultrasound to further evaluate. 2. Cholelithiasis. No intrahepatic or extrahepatic biliary duct dilatation. No findings to suggest choledocholithiasis. 3. Diffuse low signal intensity in liver parenchyma on T2 weighted imaging suggests iron deposition. 4. Atrophic kidneys with innumerable tiny cortical T2 hyperintensities, too small to characterize but likely benign and probably cysts. 5. Atelectasis dependent left lung base with possible tiny left pleural effusion. Electronically Signed   By: Kennith Center M.D.   On: 05/28/2023 08:41   MR ABDOMEN MRCP WO CONTRAST Result Date: 05/28/2023 CLINICAL DATA:  Pancreatitis. Main pancreatic ductal dilatation on CT imaging. EXAM: MRI ABDOMEN WITHOUT CONTRAST  (INCLUDING MRCP) TECHNIQUE: Multiplanar multisequence MR imaging of the abdomen was performed. Heavily T2-weighted images of the biliary and pancreatic ducts were obtained, and three-dimensional MRCP images were rendered by post processing. COMPARISON:  CT scan 05/26/2023 FINDINGS: Lower chest: Atelectasis noted dependent left lung base with possible tiny left pleural effusion. Hepatobiliary: No focal abnormality identified in the liver on this study performed without intravenous contrast material. Diffuse low signal intensity in liver parenchyma on T2 weighted imaging suggests iron  deposition. Tiny stones are seen in the gallbladder. No intrahepatic biliary duct dilatation. Common duct measures approximately 5 mm diameter. Common bile duct measures maximally at 6 mm diameter. No findings to suggest choledocholithiasis. Pancreas: As noted on CT imaging, there is diffuse distention of the main pancreatic duct through the body and tail of pancreas measuring up to 7 mm diameter in the pancreatic body. There is relatively rapid tapering of the main duct in the head of the pancreas with only a string sign of duct visible (image 22/3). Some reconstitution of the main duct into the ampulla is evident although duct in this region is nondilated. Assessment of the pancreas  is limited by motion artifact and lack of intravenous contrast. Additionally, there are surgical clips and potentially a hemostatic clip in the antral region of the stomach generating substantial susceptibility artifact obscuring portions of the pancreatic head on some pulse sequences. Within this limitation, the does appears to be some loss of parenchymal architecture in the head of the pancreas in the region of pancreatic ductal narrowing (see axial image 25 of series 3). Spleen: Some diffuse low signal intensity in the splenic parenchyma on T2 imaging suggests iron deposition. Tiny anterior T2 hyperintensity may be a cyst or pseudocyst. Adrenals/Urinary Tract: Thickening of both adrenal glands evident without a discrete nodule or mass. Kidneys are atrophic with innumerable tiny cortical T2 hyperintensities, too small to characterize but likely benign and probably cysts. 17 mm cyst noted lower interpolar right kidney with layering calcific or proteinaceous debris. No hydronephrosis on today's study. Stomach/Bowel: Stomach is decompressed with diffuse wall thickening, likely accentuated by the decompressed state. No evidence for small bowel obstruction. Vascular/Lymphatic: No abdominal aortic aneurysm. No abdominal lymphadenopathy.  Other:  No substantial intraperitoneal free fluid. Musculoskeletal: No suspicious focal marrow signal abnormality. IMPRESSION: 1. Diffuse distention of the main pancreatic duct through the body and tail of pancreas measuring up to 7 mm diameter in the pancreatic body. There is relatively rapid tapering of the main duct in the head of the pancreas with only a string sign of duct visible. Some reconstitution of the main duct into the ampulla is evident although duct in this region is nondilated. Assessment of the pancreas is limited by motion artifact and lack of intravenous contrast. Additionally, there are surgical clips and potentially a hemostatic clip in the antral region of the stomach generating substantial susceptibility artifact obscuring portions of the pancreatic head on some pulse sequences. Within this limitation, there does appears to be some loss of parenchymal architecture in the head of the pancreas in the region of pancreatic ductal narrowing. As such, pancreatic lesion/neoplasm cannot be excluded. Consider endoscopic ultrasound to further evaluate. 2. Cholelithiasis. No intrahepatic or extrahepatic biliary duct dilatation. No findings to suggest choledocholithiasis. 3. Diffuse low signal intensity in liver parenchyma on T2 weighted imaging suggests iron deposition. 4. Atrophic kidneys with innumerable tiny cortical T2 hyperintensities, too small to characterize but likely benign and probably cysts. 5. Atelectasis dependent left lung base with possible tiny left pleural effusion. Electronically Signed   By: Kennith Center M.D.   On: 05/28/2023 08:41   CT ABDOMEN PELVIS W CONTRAST Result Date: 05/26/2023 CLINICAL DATA:  Progressive abdominal pain. EXAM: CT ABDOMEN AND PELVIS WITH CONTRAST TECHNIQUE: Multidetector CT imaging of the abdomen and pelvis was performed using the standard protocol following bolus administration of intravenous contrast. RADIATION DOSE REDUCTION: This exam was performed  according to the departmental dose-optimization program which includes automated exposure control, adjustment of the mA and/or kV according to patient size and/or use of iterative reconstruction technique. CONTRAST:  75mL OMNIPAQUE IOHEXOL 350 MG/ML SOLN COMPARISON:  CT of the abdomen and pelvis without contrast on 05/20/2023. FINDINGS: Lower chest: Chronic lung disease, bibasilar scarring and mild left basilar atelectasis. Hepatobiliary: No focal liver abnormality is seen. Contracted gallbladder. No biliary dilatation. Pancreas: There is some diffuse dilatation of the pancreatic duct reaching maximal caliber 7 mm in the neck/head of the pancreas. Some dilatation was present on the 12/11 study and less apparent on the 12/07 study. There does not appear to be an obstructing lesion or significant surrounding inflammation. Correlation suggested with lipase level. No pancreatic  ductal calculi or parenchymal calcifications identified. Spleen: Normal in size without focal abnormality. Adrenals/Urinary Tract: Stable hyperplasia of both adrenal glands without focal lesion. Stable atrophic kidneys bilaterally likely reflecting end-stage renal disease. No hydronephrosis or solid renal masses. The bladder is decompressed. Stomach/Bowel: Stable appearance of endoscopic hemostatic clip in the region of the gastric antrum. No evidence of bowel obstruction, ileus, free intraperitoneal air or visible focal lesion. Moderate fecal material in the distal half of the colon without significant focal fecal impaction. Vascular/Lymphatic: Advanced atherosclerosis of the abdominal aorta and iliac arteries without aneurysm. Reproductive: Status post hysterectomy. No adnexal masses. Other: No abdominal wall hernia or abnormality. No abdominopelvic ascites. Musculoskeletal: No acute or significant osseous findings. IMPRESSION: 1. Diffuse dilatation of the pancreatic duct reaching maximal caliber 7 mm in the neck/head of the pancreas. Some  dilatation was present on the 12/11 study and less apparent on the 12/07 study. There does not appear to be an obstructing lesion or significant surrounding inflammation. Correlation suggested with lipase level. 2. Stable atrophic kidneys bilaterally likely reflecting end-stage renal disease. 3. Stable hyperplasia of both adrenal glands without focal lesion. 4. Advanced atherosclerosis of the abdominal aorta and iliac arteries without aneurysm. Aortic Atherosclerosis (ICD10-I70.0). Electronically Signed   By: Irish Lack M.D.   On: 05/26/2023 12:34   DG Chest 2 View Result Date: 05/21/2023 CLINICAL DATA:  Generalized abdominal pain for 2 weeks with diarrhea. EXAM: CHEST - 2 VIEW COMPARISON:  05/15/2023. FINDINGS: Mild pulmonary vascular congestion, similar to the prior study. No frank pulmonary edema. Bilateral lung fields are otherwise clear. No acute consolidation or lung collapse. Bilateral costophrenic angles are clear. Stable cardio-mediastinal silhouette. No acute osseous abnormalities. Left brachial/axillary vessel vascular stent again seen. The soft tissues are within normal limits. IMPRESSION: *No active cardiopulmonary disease. Persistent mild pulmonary vascular congestion without frank pulmonary edema. Electronically Signed   By: Jules Schick M.D.   On: 05/21/2023 09:12   CT ABDOMEN PELVIS WO CONTRAST Result Date: 05/20/2023 CLINICAL DATA:  Abdominal pain and diarrhea for 2 weeks, worse with eating. Dialysis patient, skipped today's appointment because she did not feel well. EXAM: CT ABDOMEN AND PELVIS WITHOUT CONTRAST TECHNIQUE: Multidetector CT imaging of the abdomen and pelvis was performed following the standard protocol without IV contrast. RADIATION DOSE REDUCTION: This exam was performed according to the departmental dose-optimization program which includes automated exposure control, adjustment of the mA and/or kV according to patient size and/or use of iterative reconstruction  technique. COMPARISON:  Multiple prior CTs back to 2006. The 2 most recent are CT without contrast 05/16/2023, CT with contrast 04/24/2023 FINDINGS: Lower chest: Bochdalek's fat herniation again noted through the posterior left hemidiaphragm into the lower chest. There is increased patchy haziness in the posterior left lower lobe which could be asymmetric atelectasis or a small pneumonia. Remaining lung bases are clear. There is mild cardiomegaly, minimal anterior pericardial effusion, calcification in the right coronary artery. Hepatobiliary: No focal liver abnormality is seen. No gallstones, gallbladder wall thickening, or biliary dilatation. Pancreas: Unremarkable without contrast. Spleen: Unremarkable without contrast.  No splenomegaly. Adrenals/Urinary Tract: Bilateral adrenal adenomatous hyperplasia. Chronically atrophic kidneys consistent with ESRD. 1.3 cm Bosniak 1 cyst again noted anterior right kidney, Hounsfield density is 11.2. No follow-up imaging recommended. There is no mass enhancement in either kidney, no urinary stone or obstruction. The bladder appears thickened but is also contracted and not optimally seen. Correlate clinically for potential cystitis. Stomach/Bowel: Contracted stomach with chronic thickened folds. Endoscopy clips again noted in  the duodenum and colon. There are thickened folds in the left abdominal small bowel but no small bowel dilatation or inflammatory change. The appendix is not as well seen as on the prior studies but the visualized portions are normal. There is no wall thickening or dilatation of the colon as well as can be seen without enteric contrast. There is sigmoid diverticulosis without diverticulitis. Vascular/Lymphatic: Extensive aortoiliac calcific plaque without AAA. Less heavy visceral branch vessel atherosclerosis. No lymphadenopathy is seen. Reproductive: Status post hysterectomy. No adnexal masses. Other: There is mild body wall anasarca and mesenteric  congestion. Trace presacral pelvic ascites. No free hemorrhage, free air or incarcerated hernia. No localizing collections or focal inflammatory process. Probable phleboliths. Musculoskeletal: No acute or significant osseous findings. IMPRESSION: 1. Increased patchy haziness in the posterior left lower lobe which could be asymmetric atelectasis or a small pneumonia. 2. Cardiomegaly with minimal anterior pericardial effusion. 3. Aortic and coronary artery atherosclerosis. 4. Chronically atrophic kidneys of ESRD. 5. Gastroenteritis. 6. Trace presacral pelvic ascites. 7. Body wall anasarca with mild mesenteric congestion. 8. Sigmoid diverticulosis without evidence of diverticulitis. 9. Bilateral adrenal adenomatous hyperplasia. Aortic Atherosclerosis (ICD10-I70.0). Electronically Signed   By: Almira Bar M.D.   On: 05/20/2023 22:18   CT ABDOMEN PELVIS WO CONTRAST Result Date: 05/16/2023 CLINICAL DATA:  Generalized abdominal pain EXAM: CT ABDOMEN AND PELVIS WITHOUT CONTRAST TECHNIQUE: Multidetector CT imaging of the abdomen and pelvis was performed following the standard protocol without IV contrast. RADIATION DOSE REDUCTION: This exam was performed according to the departmental dose-optimization program which includes automated exposure control, adjustment of the mA and/or kV according to patient size and/or use of iterative reconstruction technique. COMPARISON:  04/24/2023 FINDINGS: Lower chest: Stable interstitial changes are noted in the bases bilaterally. No new focal infiltrate is seen. Hepatobiliary: No focal liver abnormality is seen. No gallstones, gallbladder wall thickening, or biliary dilatation. Pancreas: Unremarkable. No pancreatic ductal dilatation or surrounding inflammatory changes. Spleen: Normal in size without focal abnormality. Adrenals/Urinary Tract: Adrenal glands are within normal limits. Atrophic changes are noted in the kidneys bilaterally. Renal cystic change is again seen and stable. No  follow-up is recommended. No obstructive changes are noted. The bladder is decompressed. Stomach/Bowel: No obstructive or inflammatory changes of the colon are seen. The appendix is within normal limits. Multiple endoscopy clips are noted throughout the colon and proximal duodenum. Stomach and remainder of the small bowel appear within normal limits. Vascular/Lymphatic: Aortic atherosclerosis. No enlarged abdominal or pelvic lymph nodes. Reproductive: Status post hysterectomy. No adnexal masses. Other: No abdominal wall hernia or abnormality. No abdominopelvic ascites. Musculoskeletal: No acute or significant osseous findings. IMPRESSION: No acute abnormality noted. Electronically Signed   By: Alcide Clever M.D.   On: 05/16/2023 00:36   DG Chest 2 View Result Date: 05/15/2023 CLINICAL DATA:  Generalized abdominal pain. History of pancreatitis. Wheezing on right side. EXAM: CHEST - 2 VIEW COMPARISON:  04/24/2023. FINDINGS: The heart is enlarged and mediastinal contours are within normal limits. The pulmonary vasculature is mildly distended. There is atherosclerotic calcification of the aorta. No consolidation, effusion, or pneumothorax. Nipple shadows are present bilaterally. A vascular stent is noted in the left axilla. No acute osseous abnormality is seen. IMPRESSION: Cardiomegaly with mildly distended pulmonary vasculature. Electronically Signed   By: Thornell Sartorius M.D.   On: 05/15/2023 22:37     TODAY-DAY OF DISCHARGE:  Subjective:   Ashley [name] today has no headache,no chest abdominal pain,no new weakness tingling or numbness, feels much better wants to  go home today.   Objective:   Blood pressure (!) 145/59, pulse 74, temperature 98.8 F (37.1 C), temperature source Oral, resp. rate 18, height 5\' 2"  (1.575 m), weight 45.9 kg, SpO2 97%.  Intake/Output Summary (Last 24 hours) at 05/28/2023 1217 Last data filed at 05/28/2023 0900 Gross per 24 hour  Intake 240 ml  Output --  Net 240 ml    Filed Weights   05/27/23 1122 05/27/23 1129 05/28/23 0500  Weight: 46.5 kg 46.5 kg 45.9 kg    Exam: Awake Alert, Oriented *3, No new F.N deficits, Normal affect [place].AT,PERRAL Supple Neck,No JVD, No cervical lymphadenopathy appriciated.  Symmetrical Chest wall movement, Good air movement bilaterally, CTAB RRR,No Gallops,Rubs or new Murmurs, No Parasternal Heave +ve B.Sounds, Abd Soft, Non tender, No organomegaly appriciated, No rebound -guarding or rigidity. No Cyanosis, Clubbing or edema, No new Rash or bruise   PERTINENT RADIOLOGIC STUDIES: MR 3D Recon At Scanner Result Date: 05/28/2023 CLINICAL DATA:  Pancreatitis. Main pancreatic ductal dilatation on CT imaging. EXAM: MRI ABDOMEN WITHOUT CONTRAST  (INCLUDING MRCP) TECHNIQUE: Multiplanar multisequence MR imaging of the abdomen was performed. Heavily T2-weighted images of the biliary and pancreatic ducts were obtained, and three-dimensional MRCP images were rendered by post processing. COMPARISON:  CT scan 05/26/2023 FINDINGS: Lower chest: Atelectasis noted dependent left lung base with possible tiny left pleural effusion. Hepatobiliary: No focal abnormality identified in the liver on this study performed without intravenous contrast material. Diffuse low signal intensity in liver parenchyma on T2 weighted imaging suggests iron deposition. Tiny stones are seen in the gallbladder. No intrahepatic biliary duct dilatation. Common duct measures approximately 5 mm diameter. Common bile duct measures maximally at 6 mm diameter. No findings to suggest choledocholithiasis. Pancreas: As noted on CT imaging, there is diffuse distention of the main pancreatic duct through the body and tail of pancreas measuring up to 7 mm diameter in the pancreatic body. There is relatively rapid tapering of the main duct in the head of the pancreas with only a string sign of duct visible (image 22/3). Some reconstitution of the main duct into the ampulla is evident  although duct in this region is nondilated. Assessment of the pancreas is limited by motion artifact and lack of intravenous contrast. Additionally, there are surgical clips and potentially a hemostatic clip in the antral region of the stomach generating substantial susceptibility artifact obscuring portions of the pancreatic head on some pulse sequences. Within this limitation, the does appears to be some loss of parenchymal architecture in the head of the pancreas in the region of pancreatic ductal narrowing (see axial image 25 of series 3). Spleen: Some diffuse low signal intensity in the splenic parenchyma on T2 imaging suggests iron deposition. Tiny anterior T2 hyperintensity may be a cyst or pseudocyst. Adrenals/Urinary Tract: Thickening of both adrenal glands evident without a discrete nodule or mass. Kidneys are atrophic with innumerable tiny cortical T2 hyperintensities, too small to characterize but likely benign and probably cysts. 17 mm cyst noted lower interpolar right kidney with layering calcific or proteinaceous debris. No hydronephrosis on today's study. Stomach/Bowel: Stomach is decompressed with diffuse wall thickening, likely accentuated by the decompressed state. No evidence for small bowel obstruction. Vascular/Lymphatic: No abdominal aortic aneurysm. No abdominal lymphadenopathy. Other:  No substantial intraperitoneal free fluid. Musculoskeletal: No suspicious focal marrow signal abnormality. IMPRESSION: 1. Diffuse distention of the main pancreatic duct through the body and tail of pancreas measuring up to 7 mm diameter in the pancreatic body. There is relatively rapid tapering  of the main duct in the head of the pancreas with only a string sign of duct visible. Some reconstitution of the main duct into the ampulla is evident although duct in this region is nondilated. Assessment of the pancreas is limited by motion artifact and lack of intravenous contrast. Additionally, there are surgical  clips and potentially a hemostatic clip in the antral region of the stomach generating substantial susceptibility artifact obscuring portions of the pancreatic head on some pulse sequences. Within this limitation, there does appears to be some loss of parenchymal architecture in the head of the pancreas in the region of pancreatic ductal narrowing. As such, pancreatic lesion/neoplasm cannot be excluded. Consider endoscopic ultrasound to further evaluate. 2. Cholelithiasis. No intrahepatic or extrahepatic biliary duct dilatation. No findings to suggest choledocholithiasis. 3. Diffuse low signal intensity in liver parenchyma on T2 weighted imaging suggests iron deposition. 4. Atrophic kidneys with innumerable tiny cortical T2 hyperintensities, too small to characterize but likely benign and probably cysts. 5. Atelectasis dependent left lung base with possible tiny left pleural effusion. Electronically Signed   By: Kennith Center M.D.   On: 05/28/2023 08:41   MR ABDOMEN MRCP WO CONTRAST Result Date: 05/28/2023 CLINICAL DATA:  Pancreatitis. Main pancreatic ductal dilatation on CT imaging. EXAM: MRI ABDOMEN WITHOUT CONTRAST  (INCLUDING MRCP) TECHNIQUE: Multiplanar multisequence MR imaging of the abdomen was performed. Heavily T2-weighted images of the biliary and pancreatic ducts were obtained, and three-dimensional MRCP images were rendered by post processing. COMPARISON:  CT scan 05/26/2023 FINDINGS: Lower chest: Atelectasis noted dependent left lung base with possible tiny left pleural effusion. Hepatobiliary: No focal abnormality identified in the liver on this study performed without intravenous contrast material. Diffuse low signal intensity in liver parenchyma on T2 weighted imaging suggests iron deposition. Tiny stones are seen in the gallbladder. No intrahepatic biliary duct dilatation. Common duct measures approximately 5 mm diameter. Common bile duct measures maximally at 6 mm diameter. No findings to  suggest choledocholithiasis. Pancreas: As noted on CT imaging, there is diffuse distention of the main pancreatic duct through the body and tail of pancreas measuring up to 7 mm diameter in the pancreatic body. There is relatively rapid tapering of the main duct in the head of the pancreas with only a string sign of duct visible (image 22/3). Some reconstitution of the main duct into the ampulla is evident although duct in this region is nondilated. Assessment of the pancreas is limited by motion artifact and lack of intravenous contrast. Additionally, there are surgical clips and potentially a hemostatic clip in the antral region of the stomach generating substantial susceptibility artifact obscuring portions of the pancreatic head on some pulse sequences. Within this limitation, the does appears to be some loss of parenchymal architecture in the head of the pancreas in the region of pancreatic ductal narrowing (see axial image 25 of series 3). Spleen: Some diffuse low signal intensity in the splenic parenchyma on T2 imaging suggests iron deposition. Tiny anterior T2 hyperintensity may be a cyst or pseudocyst. Adrenals/Urinary Tract: Thickening of both adrenal glands evident without a discrete nodule or mass. Kidneys are atrophic with innumerable tiny cortical T2 hyperintensities, too small to characterize but likely benign and probably cysts. 17 mm cyst noted lower interpolar right kidney with layering calcific or proteinaceous debris. No hydronephrosis on today's study. Stomach/Bowel: Stomach is decompressed with diffuse wall thickening, likely accentuated by the decompressed state. No evidence for small bowel obstruction. Vascular/Lymphatic: No abdominal aortic aneurysm. No abdominal lymphadenopathy. Other:  No  substantial intraperitoneal free fluid. Musculoskeletal: No suspicious focal marrow signal abnormality. IMPRESSION: 1. Diffuse distention of the main pancreatic duct through the body and tail of pancreas  measuring up to 7 mm diameter in the pancreatic body. There is relatively rapid tapering of the main duct in the head of the pancreas with only a string sign of duct visible. Some reconstitution of the main duct into the ampulla is evident although duct in this region is nondilated. Assessment of the pancreas is limited by motion artifact and lack of intravenous contrast. Additionally, there are surgical clips and potentially a hemostatic clip in the antral region of the stomach generating substantial susceptibility artifact obscuring portions of the pancreatic head on some pulse sequences. Within this limitation, there does appears to be some loss of parenchymal architecture in the head of the pancreas in the region of pancreatic ductal narrowing. As such, pancreatic lesion/neoplasm cannot be excluded. Consider endoscopic ultrasound to further evaluate. 2. Cholelithiasis. No intrahepatic or extrahepatic biliary duct dilatation. No findings to suggest choledocholithiasis. 3. Diffuse low signal intensity in liver parenchyma on T2 weighted imaging suggests iron deposition. 4. Atrophic kidneys with innumerable tiny cortical T2 hyperintensities, too small to characterize but likely benign and probably cysts. 5. Atelectasis dependent left lung base with possible tiny left pleural effusion. Electronically Signed   By: Kennith Center M.D.   On: 05/28/2023 08:41     PERTINENT LAB RESULTS: CBC: Recent Labs    05/27/23 0437  WBC 5.8  HGB 11.4*  HCT 35.4*  PLT 237   CMET CMP     Component Value Date/Time   NA 130 (L) 05/28/2023 0856   NA 139 04/02/2018 1058   K 3.9 05/28/2023 0856   CL 92 (L) 05/28/2023 0856   CO2 23 05/28/2023 0856   GLUCOSE 161 (H) 05/28/2023 0856   BUN 25 (H) 05/28/2023 0856   BUN 25 04/02/2018 1058   CREATININE 5.83 (H) 05/28/2023 0856   CREATININE 1.52 (H) 09/19/2014 1411   CALCIUM 9.2 05/28/2023 0856   PROT 6.2 (L) 05/26/2023 1358   PROT 7.4 04/02/2018 1058   ALBUMIN 2.8 (L)  05/28/2023 0856   ALBUMIN 4.3 04/02/2018 1058   AST 36 05/26/2023 1358   ALT 15 05/26/2023 1358   ALT 18 04/06/2019 1424   ALKPHOS 53 05/26/2023 1358   BILITOT 0.6 05/26/2023 1358   BILITOT 0.3 04/02/2018 1058   GFRNONAA 7 (L) 05/28/2023 0856    GFR Estimated Creatinine Clearance: 6.1 mL/min (A) (by C-G formula based on SCr of 5.83 mg/dL (H)). Recent Labs    05/26/23 1358  LIPASE 58*   No results for input(s): "CKTOTAL", "CKMB", "CKMBINDEX", "TROPONINI" in the last 72 hours. Invalid input(s): "POCBNP" No results for input(s): "DDIMER" in the last 72 hours. No results for input(s): "HGBA1C" in the last 72 hours. No results for input(s): "CHOL", "HDL", "LDLCALC", "TRIG", "CHOLHDL", "LDLDIRECT" in the last 72 hours. No results for input(s): "TSH", "T4TOTAL", "T3FREE", "THYROIDAB" in the last 72 hours.  Invalid input(s): "FREET3" No results for input(s): "VITAMINB12", "FOLATE", "FERRITIN", "TIBC", "IRON", "RETICCTPCT" in the last 72 hours. Coags: No results for input(s): "INR" in the last 72 hours.  Invalid input(s): "PT" Microbiology: No results found for this or any previous visit (from the past 240 hours).  FURTHER DISCHARGE INSTRUCTIONS:  Get Medicines reviewed and adjusted: Please take all your medications with you for your next visit with your Primary MD  Laboratory/radiological data: Please request your Primary MD to go over all hospital  tests and procedure/radiological results at the follow up, please ask your Primary MD to get all Hospital records sent to his/her office.  In some cases, they will be blood work, cultures and biopsy results pending at the time of your discharge. Please request that your primary care M.D. goes through all the records of your hospital data and follows up on these results.  Also Note the following: If you experience worsening of your admission symptoms, develop shortness of breath, life threatening emergency, suicidal or homicidal  thoughts you must seek medical attention immediately by calling 911 or calling your MD immediately  if symptoms less severe.  You must read complete instructions/literature along with all the possible adverse reactions/side effects for all the Medicines you take and that have been prescribed to you. Take any new Medicines after you have completely understood and accpet all the possible adverse reactions/side effects.   Do not drive when taking Pain medications or sleeping medications (Benzodaizepines)  Do not take more than prescribed Pain, Sleep and Anxiety Medications. It is not advisable to combine anxiety,sleep and pain medications without talking with your primary care practitioner  Special Instructions: If you have smoked or chewed Tobacco  in the last 2 yrs please stop smoking, stop any regular Alcohol  and or any Recreational drug use.  Wear Seat belts while driving.  Please note: You were cared for by a hospitalist during your hospital stay. Once you are discharged, your primary care physician will handle any further medical issues. Please note that NO REFILLS for any discharge medications will be authorized once you are discharged, as it is imperative that you return to your primary care physician (or establish a relationship with a primary care physician if you do not have one) for your post hospital discharge needs so that they can reassess your need for medications and monitor your lab values.  Total Time spent coordinating discharge including counseling, education and face to face time equals greater than 30 minutes.  SignedJeoffrey Massed 05/28/2023 12:17 PM

## 2023-05-28 NOTE — Progress Notes (Signed)
Physical Therapy Treatment Patient Details Name: Ashley [name] MRN: [id number] DOB: 1950/07/31 Today's Date: 05/28/2023   History of Present Illness 72 yo female presenting to [facility] on 05/20/23  via EMS from home after complaining of generalized abdominal pain for 2 weeks associated with diarrhea worse after eating. CT showed possible acute gastroenteritis. PMH including hypertension, pancreatitis, HLD, ESRD on hemodialysis, CVA, polysubstance abuse.    PT Comments  Pt received sitting in the recliner and agreeable to session. Pt instructed in use of rollator and able to demonstrate understanding. Pt able to tolerate increased gait distance and stair trial with up to CGA for safety and cues for technique. Pt demonstrates some instability and BLE weakness, but no LOB during mobility tasks. Pt requires one seated rest break after stair trial due to fatigue. Discussed safety with tub transfer per pt request. Education on energy conservation and reducing fall risk at home. Pt continues to benefit from PT services to progress toward functional mobility goals.     If plan is discharge home, recommend the following: Help with stairs or ramp for entrance;Assist for transportation;Assistance with cooking/housework   Can travel by Pension scheme manager (4 wheels)    Recommendations for Other Services       Precautions / Restrictions Precautions Precautions: None Restrictions Weight Bearing Restrictions Per Provider Order: No     Mobility  Bed Mobility               General bed mobility comments: pt in recliner at beginning and end of session    Transfers Overall transfer level: Needs assistance Equipment used: Rollator (4 wheels) Transfers: Sit to/from Stand Sit to Stand: Supervision           General transfer comment: cues for use of rollator brakes    Ambulation/Gait Ambulation/Gait assistance: Contact guard assist, Supervision Gait  Distance (Feet): 150 Feet Assistive device: Rollator (4 wheels) Gait Pattern/deviations: Trunk flexed, Step-through pattern, Decreased stride length Gait velocity: decreased     General Gait Details: cues for upright posture and rollator proximity. CGA progressing to supervision for safety   Stairs Stairs: Yes Stairs assistance: Contact guard assist Stair Management: Two rails, Alternating pattern Number of Stairs: 10 General stair comments: Pt able to complete with CGA for safety and cues for sequencing and safety. Noted weakness, but no LOB      Balance Overall balance assessment: Needs assistance Sitting-balance support: No upper extremity supported, Feet supported Sitting balance-Leahy Scale: Good     Standing balance support: No upper extremity supported, During functional activity Standing balance-Leahy Scale: Fair Standing balance comment: with rollator support                            Cognition Arousal: Alert Behavior During Therapy: WFL for tasks assessed/performed Overall Cognitive Status: Within Functional Limits for tasks assessed                                          Exercises      General Comments General comments (skin integrity, edema, etc.): VSS      Pertinent Vitals/Pain Pain Assessment Pain Assessment: Faces Faces Pain Scale: No hurt     PT Goals (current goals can now be found in the care plan section) Acute Rehab PT Goals Patient Stated Goal:  decreased abdominal pain PT Goal Formulation: With patient Time For Goal Achievement: 06/06/23 Progress towards PT goals: Progressing toward goals    Frequency    Min 1X/week       AM-PAC PT "6 Clicks" Mobility   Outcome Measure  Help needed turning from your back to your side while in a flat bed without using bedrails?: None Help needed moving from lying on your back to sitting on the side of a flat bed without using bedrails?: None Help needed moving to  and from a bed to a chair (including a wheelchair)?: A Little Help needed standing up from a chair using your arms (e.g., wheelchair or bedside chair)?: A Little Help needed to walk in hospital room?: A Little Help needed climbing 3-5 steps with a railing? : A Little 6 Click Score: 20    End of Session Equipment Utilized During Treatment: Gait belt Activity Tolerance: Patient limited by fatigue;Patient tolerated treatment well Patient left: with call bell/phone within reach;in chair Nurse Communication: Mobility status PT Visit Diagnosis: Muscle weakness (generalized) (M62.81);Difficulty in walking, not elsewhere classified (R26.2);Pain     Time: 1325-1345 PT Time Calculation (min) (ACUTE ONLY): 20 min  Charges:    $Gait Training: 8-22 mins PT General Charges $$ ACUTE PT VISIT: 1 Visit                     Johny Shock, PTA Acute Rehabilitation Services Secure Chat Preferred  [phone]    Johny Shock 05/28/2023, 2:01 PM

## 2023-05-28 NOTE — Progress Notes (Signed)
[facility] Progress Note   Subjective:  Seen in room - intermittently worse abd pain, worse today. GI consulted yesterday - plan is for MRCP. No CP/dyspnea.   Objective Vitals:   05/27/23 1529 05/27/23 1957 05/28/23 0044 05/28/23 0500  BP: (!) 145/69  (!) 151/58   Pulse: 90  76   Resp: 17  16   Temp:  99 F (37.2 C) 98 F (36.7 C)   TempSrc:  Oral Oral   SpO2: 98%  98%   Weight:    45.9 kg  Height:       Physical Exam General: Well appearing, NAD. Room air. Heart: RRR; no murmur Lungs: CTAB Abdomen: soft, but mild generalized tenderness Extremities: no LE edema Dialysis Access: LUE AVF  Additional Objective Labs: Basic Metabolic Panel: Recent Labs  Lab 05/21/23 1531 05/22/23 0752 05/23/23 0528 05/25/23 0506 05/27/23 0437  NA 137 134* 133* 133* 128*  K 5.5* 5.6* 4.1 4.5 4.7  CL 95* 93* 92* 97* 92*  CO2 21* 22 27 22  19*  GLUCOSE 74 82 87 86 74  BUN 50* 54* 23 42* 37*  CREATININE 11.87* 12.78* 7.10* 11.68* 8.77*  CALCIUM 8.6* 8.8* 9.1 9.2 9.3  PHOS 7.3* 7.5* 5.3*  --   --    Liver Function Tests: Recent Labs  Lab 05/22/23 0752 05/23/23 0528 05/26/23 1358  AST  --   --  36  ALT  --   --  15  ALKPHOS  --   --  53  BILITOT  --   --  0.6  PROT  --   --  6.2*  ALBUMIN 2.9* 3.0* 2.7*   Recent Labs  Lab 05/26/23 1358  LIPASE 58*   CBC: Recent Labs  Lab 05/22/23 0551 05/23/23 0528 05/25/23 0506 05/27/23 0437  WBC 6.1 5.2 6.1 5.8  HGB 11.1* 11.6* 11.7* 11.4*  HCT 34.8* 37.0 36.9 35.4*  MCV 85.3 84.9 85.0 83.3  PLT 276 254 247 237   Studies/Results: CT ABDOMEN PELVIS W CONTRAST Result Date: 05/26/2023 CLINICAL DATA:  Progressive abdominal pain. EXAM: CT ABDOMEN AND PELVIS WITH CONTRAST TECHNIQUE: Multidetector CT imaging of the abdomen and pelvis was performed using the standard protocol following bolus administration of intravenous contrast. RADIATION DOSE REDUCTION: This exam was performed according to the departmental  dose-optimization program which includes automated exposure control, adjustment of the mA and/or kV according to patient size and/or use of iterative reconstruction technique. CONTRAST:  75mL OMNIPAQUE IOHEXOL 350 MG/ML SOLN COMPARISON:  CT of the abdomen and pelvis without contrast on 05/20/2023. FINDINGS: Lower chest: Chronic lung disease, bibasilar scarring and mild left basilar atelectasis. Hepatobiliary: No focal liver abnormality is seen. Contracted gallbladder. No biliary dilatation. Pancreas: There is some diffuse dilatation of the pancreatic duct reaching maximal caliber 7 mm in the neck/head of the pancreas. Some dilatation was present on the 12/11 study and less apparent on the 12/07 study. There does not appear to be an obstructing lesion or significant surrounding inflammation. Correlation suggested with lipase level. No pancreatic ductal calculi or parenchymal calcifications identified. Spleen: Normal in size without focal abnormality. Adrenals/Urinary Tract: Stable hyperplasia of both adrenal glands without focal lesion. Stable atrophic kidneys bilaterally likely reflecting end-stage renal disease. No hydronephrosis or solid renal masses. The bladder is decompressed. Stomach/Bowel: Stable appearance of endoscopic hemostatic clip in the region of the gastric antrum. No evidence of bowel obstruction, ileus, free intraperitoneal air or visible focal lesion. Moderate fecal material in the distal half of the colon  without significant focal fecal impaction. Vascular/Lymphatic: Advanced atherosclerosis of the abdominal aorta and iliac arteries without aneurysm. Reproductive: Status post hysterectomy. No adnexal masses. Other: No abdominal wall hernia or abnormality. No abdominopelvic ascites. Musculoskeletal: No acute or significant osseous findings. IMPRESSION: 1. Diffuse dilatation of the pancreatic duct reaching maximal caliber 7 mm in the neck/head of the pancreas. Some dilatation was present on the  12/11 study and less apparent on the 12/07 study. There does not appear to be an obstructing lesion or significant surrounding inflammation. Correlation suggested with lipase level. 2. Stable atrophic kidneys bilaterally likely reflecting end-stage renal disease. 3. Stable hyperplasia of both adrenal glands without focal lesion. 4. Advanced atherosclerosis of the abdominal aorta and iliac arteries without aneurysm. Aortic Atherosclerosis (ICD10-I70.0). Electronically Signed   By: Irish Lack M.D.   On: 05/26/2023 12:34   Medications:  promethazine (PHENERGAN) injection (IM or IVPB)      (feeding supplement) PROSource Plus  30 mL Oral BID BM   amLODipine  10 mg Oral Q1200   Chlorhexidine Gluconate Cloth  6 each Topical Q0600   gabapentin  200 mg Oral QHS   heparin injection (subcutaneous)  5,000 Units Subcutaneous Q8H   hydrALAZINE  75 mg Oral Q8H   lipase/protease/amylase  48,000 Units Oral BID WC   pantoprazole  40 mg Oral Daily   sevelamer carbonate  800 mg Oral TID WC    Dialysis Orders: MWF [facility] 3.5hr, 350/500, EDW 46.2kg, 2K/2.5Ca bath, AVF, no heparin - last HD 12/09, post wt 46.3kg - getting to dry wt mostly   Assessment/Plan: Abdominal pain/diarrhea: Possible enteritis per CT. She does have chronic pancreatitis. Diarrhea improved and abd pain is less. Finishing course of Ceftriaxone + Flagyl, on home Creon. CT abd 12/17 with dilation pancreatic duct -> GI consulted, for MRCP today. ESRD: Continue HD on usual MWF - next HD tomorrow. HTN/volume- BP up, initial CXR with vasc congestion, and hypoNa - have been challenging EDW, now below prior EDW - will lower on discharge. 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 Secondary HPTH: CorrCa slightly high - no VDRA here, Phos good - continue sevelamer. Nutrition: Alb low, continue protein supplements.  Ozzie Hoyle, PA-C 05/28/2023, 9:44 AM  [facility]

## 2023-05-28 NOTE — Progress Notes (Signed)
PROGRESS NOTE        PATIENT DETAILS Name: Ashley [name] Age: 72 y.o. Sex: female Date of Birth: 04/25/1951 Admit Date: 05/20/2023 Admitting Physician Angie Fava, DO ZOX:[id number], Marlynn Perking., FNP  Brief Summary: Patient is a 72 y.o.  female with history of ESRD on HD MWF, chronic pancreatitis, HTN, HLD, tobacco use-who presented with 2-week history  of diarrhea/abdominal pain.  Significant events: 12/11>> admit to [facility]  Significant studies: 12/11>> CT abdomen/pelvis: No acute abnormality 12/17>> CT abdomen/pelvis: Diffuse dilatation of the pancreatic duct-7 mm. 12/18>> MRCP: Pancreatic neoplasm cannot be excluded (rapid tapering of the duct in the head of pancreas with string sign)  Significant microbiology data: None  Procedures: None  Consults: Nephrology GI  Subjective: Mid abdominal pain continues.  No diarrhea.  Tolerating diet.  Objective: Vitals: Blood pressure (!) 145/59, pulse 74, temperature 98.8 F (37.1 C), temperature source Oral, resp. rate 18, height 5\' 2"  (1.575 m), weight 45.9 kg, SpO2 97%.   Exam: Gen Exam:Alert awake-not in any distress HEENT:atraumatic, normocephalic Chest: B/L clear to auscultation anteriorly CVS:S1S2 regular Abdomen:soft mildly tender in the mid abdominal area. Extremities:no edema Neurology: Non focal Skin: no rash  Pertinent Labs/Radiology:    Latest Ref Rng & Units 05/27/2023    4:37 AM 05/25/2023    5:06 AM 05/23/2023    5:28 AM  CBC  WBC 4.0 - 10.5 K/uL 5.8  6.1  5.2   Hemoglobin 12.0 - 15.0 g/dL 54.0  98.1  19.1   Hematocrit 36.0 - 46.0 % 35.4  36.9  37.0   Platelets 150 - 400 K/uL 237  247  254     Lab Results  Component Value Date   NA 130 (L) 05/28/2023   K 3.9 05/28/2023   CL 92 (L) 05/28/2023   CO2 23 05/28/2023      Assessment/Plan: Gastroenteritis Resolved-tolerating advancement in diet Completed 5 days of empiric Rocephin/Flagyl  Acute on chronic  pancreatitis Abdominal pain continues-MRCP completed on 12/18-some concern for potential pancreatic neoplasm-however artifact limited study.   Will await GI input-regarding neck steps. Continue with Creon and other supportive care. Tolerating diet-pain relatively well-controlled with oral narcotics  Hyperkalemia Resolved  ESRD on HD MWF Nephrology following and directing care  Hyponatremia Improved with HD-mild-asymptomatic.  HTN Stable Amlodipine  GERD PPI On long-term Carafate-which has been discontinued due to ESRD status  Tobacco abuse Transdermal nicotine  Underweight: Estimated body mass index is 18.51 kg/m as calculated from the following:   Height as of this encounter: 5\' 2"  (1.575 m).   Weight as of this encounter: 45.9 kg.   Code status:   Code Status: Full Code   DVT Prophylaxis: heparin injection 5,000 Units Start: 05/21/23 0800   Family Communication: None at bedside   Disposition Plan: Status is: Inpatient Remains inpatient appropriate because: Severity of illness   Planned Discharge Destination:Home   Diet: Diet Order             Diet renal with fluid restriction Fluid restriction: 1200 mL Fluid; Room service appropriate? Yes; Fluid consistency: Thin  Diet effective now                     Antimicrobial agents: Anti-infectives (From admission, onward)    Start     Dose/Rate Route Frequency Ordered Stop   05/21/23 1630  cefTRIAXone (  ROCEPHIN) 2 g in sodium chloride 0.9 % 100 mL IVPB  Status:  Discontinued        2 g 200 mL/hr over 30 Minutes Intravenous Every 24 hours 05/21/23 1541 05/26/23 1335   05/21/23 1630  metroNIDAZOLE (FLAGYL) IVPB 500 mg  Status:  Discontinued        500 mg 100 mL/hr over 60 Minutes Intravenous Every 12 hours 05/21/23 1541 05/26/23 1335        MEDICATIONS: Scheduled Meds:  (feeding supplement) PROSource Plus  30 mL Oral BID BM   amLODipine  10 mg Oral Q1200   Chlorhexidine Gluconate Cloth  6 each  Topical Q0600   gabapentin  200 mg Oral QHS   heparin injection (subcutaneous)  5,000 Units Subcutaneous Q8H   hydrALAZINE  75 mg Oral Q8H   lipase/protease/amylase  48,000 Units Oral BID WC   pantoprazole  40 mg Oral Daily   sevelamer carbonate  800 mg Oral TID WC   Continuous Infusions:  promethazine (PHENERGAN) injection (IM or IVPB)     PRN Meds:.acetaminophen **OR** acetaminophen, melatonin, naLOXone (NARCAN)  injection, ondansetron (ZOFRAN) IV, oxyCODONE, promethazine (PHENERGAN) injection (IM or IVPB)   I have personally reviewed following labs and imaging studies  LABORATORY DATA: CBC: Recent Labs  Lab 05/22/23 0551 05/23/23 0528 05/25/23 0506 05/27/23 0437  WBC 6.1 5.2 6.1 5.8  HGB 11.1* 11.6* 11.7* 11.4*  HCT 34.8* 37.0 36.9 35.4*  MCV 85.3 84.9 85.0 83.3  PLT 276 254 247 237    Basic Metabolic Panel: Recent Labs  Lab 05/21/23 1531 05/22/23 0752 05/23/23 0528 05/25/23 0506 05/27/23 0437 05/28/23 0856  NA 137 134* 133* 133* 128* 130*  K 5.5* 5.6* 4.1 4.5 4.7 3.9  CL 95* 93* 92* 97* 92* 92*  CO2 21* 22 27 22  19* 23  GLUCOSE 74 82 87 86 74 161*  BUN 50* 54* 23 42* 37* 25*  CREATININE 11.87* 12.78* 7.10* 11.68* 8.77* 5.83*  CALCIUM 8.6* 8.8* 9.1 9.2 9.3 9.2  PHOS 7.3* 7.5* 5.3*  --   --  4.2    GFR: Estimated Creatinine Clearance: 6.1 mL/min (A) (by C-G formula based on SCr of 5.83 mg/dL (H)).  Liver Function Tests: Recent Labs  Lab 05/21/23 1531 05/22/23 0752 05/23/23 0528 05/26/23 1358 05/28/23 0856  AST  --   --   --  36  --   ALT  --   --   --  15  --   ALKPHOS  --   --   --  53  --   BILITOT  --   --   --  0.6  --   PROT  --   --   --  6.2*  --   ALBUMIN 3.3* 2.9* 3.0* 2.7* 2.8*   Recent Labs  Lab 05/26/23 1358  LIPASE 58*   No results for input(s): "AMMONIA" in the last 168 hours.  Coagulation Profile: No results for input(s): "INR", "PROTIME" in the last 168 hours.  Cardiac Enzymes: No results for input(s): "CKTOTAL",  "CKMB", "CKMBINDEX", "TROPONINI" in the last 168 hours.  BNP (last 3 results) No results for input(s): "PROBNP" in the last 8760 hours.  Lipid Profile: No results for input(s): "CHOL", "HDL", "LDLCALC", "TRIG", "CHOLHDL", "LDLDIRECT" in the last 72 hours.  Thyroid Function Tests: No results for input(s): "TSH", "T4TOTAL", "FREET4", "T3FREE", "THYROIDAB" in the last 72 hours.  Anemia Panel: No results for input(s): "VITAMINB12", "FOLATE", "FERRITIN", "TIBC", "IRON", "RETICCTPCT" in the last 72 hours.  Urine  analysis:    Component Value Date/Time   COLORURINE YELLOW 12/31/2018 1014   APPEARANCEUR CLEAR 12/31/2018 1014   LABSPEC 1.008 12/31/2018 1014   PHURINE 9.0 (H) 12/31/2018 1014   GLUCOSEU NEGATIVE 12/31/2018 1014   HGBUR SMALL (A) 12/31/2018 1014   BILIRUBINUR NEGATIVE 12/31/2018 1014   BILIRUBINUR NEG 01/29/2016 1142   KETONESUR NEGATIVE 12/31/2018 1014   PROTEINUR >=300 (A) 12/31/2018 1014   UROBILINOGEN 0.2 01/29/2016 1142   UROBILINOGEN 0.2 03/21/2015 1649   NITRITE NEGATIVE 12/31/2018 1014   LEUKOCYTESUR NEGATIVE 12/31/2018 1014    Sepsis Labs: Lactic Acid, Venous    Component Value Date/Time   LATICACIDVEN 1.2 04/24/2023 1601    MICROBIOLOGY: No results found for this or any previous visit (from the past 240 hours).  RADIOLOGY STUDIES/RESULTS: MR 3D Recon At Scanner Result Date: 05/28/2023 CLINICAL DATA:  Pancreatitis. Main pancreatic ductal dilatation on CT imaging. EXAM: MRI ABDOMEN WITHOUT CONTRAST  (INCLUDING MRCP) TECHNIQUE: Multiplanar multisequence MR imaging of the abdomen was performed. Heavily T2-weighted images of the biliary and pancreatic ducts were obtained, and three-dimensional MRCP images were rendered by post processing. COMPARISON:  CT scan 05/26/2023 FINDINGS: Lower chest: Atelectasis noted dependent left lung base with possible tiny left pleural effusion. Hepatobiliary: No focal abnormality identified in the liver on this study performed  without intravenous contrast material. Diffuse low signal intensity in liver parenchyma on T2 weighted imaging suggests iron deposition. Tiny stones are seen in the gallbladder. No intrahepatic biliary duct dilatation. Common duct measures approximately 5 mm diameter. Common bile duct measures maximally at 6 mm diameter. No findings to suggest choledocholithiasis. Pancreas: As noted on CT imaging, there is diffuse distention of the main pancreatic duct through the body and tail of pancreas measuring up to 7 mm diameter in the pancreatic body. There is relatively rapid tapering of the main duct in the head of the pancreas with only a string sign of duct visible (image 22/3). Some reconstitution of the main duct into the ampulla is evident although duct in this region is nondilated. Assessment of the pancreas is limited by motion artifact and lack of intravenous contrast. Additionally, there are surgical clips and potentially a hemostatic clip in the antral region of the stomach generating substantial susceptibility artifact obscuring portions of the pancreatic head on some pulse sequences. Within this limitation, the does appears to be some loss of parenchymal architecture in the head of the pancreas in the region of pancreatic ductal narrowing (see axial image 25 of series 3). Spleen: Some diffuse low signal intensity in the splenic parenchyma on T2 imaging suggests iron deposition. Tiny anterior T2 hyperintensity may be a cyst or pseudocyst. Adrenals/Urinary Tract: Thickening of both adrenal glands evident without a discrete nodule or mass. Kidneys are atrophic with innumerable tiny cortical T2 hyperintensities, too small to characterize but likely benign and probably cysts. 17 mm cyst noted lower interpolar right kidney with layering calcific or proteinaceous debris. No hydronephrosis on today's study. Stomach/Bowel: Stomach is decompressed with diffuse wall thickening, likely accentuated by the decompressed  state. No evidence for small bowel obstruction. Vascular/Lymphatic: No abdominal aortic aneurysm. No abdominal lymphadenopathy. Other:  No substantial intraperitoneal free fluid. Musculoskeletal: No suspicious focal marrow signal abnormality. IMPRESSION: 1. Diffuse distention of the main pancreatic duct through the body and tail of pancreas measuring up to 7 mm diameter in the pancreatic body. There is relatively rapid tapering of the main duct in the head of the pancreas with only a string sign of duct visible. Some  reconstitution of the main duct into the ampulla is evident although duct in this region is nondilated. Assessment of the pancreas is limited by motion artifact and lack of intravenous contrast. Additionally, there are surgical clips and potentially a hemostatic clip in the antral region of the stomach generating substantial susceptibility artifact obscuring portions of the pancreatic head on some pulse sequences. Within this limitation, there does appears to be some loss of parenchymal architecture in the head of the pancreas in the region of pancreatic ductal narrowing. As such, pancreatic lesion/neoplasm cannot be excluded. Consider endoscopic ultrasound to further evaluate. 2. Cholelithiasis. No intrahepatic or extrahepatic biliary duct dilatation. No findings to suggest choledocholithiasis. 3. Diffuse low signal intensity in liver parenchyma on T2 weighted imaging suggests iron deposition. 4. Atrophic kidneys with innumerable tiny cortical T2 hyperintensities, too small to characterize but likely benign and probably cysts. 5. Atelectasis dependent left lung base with possible tiny left pleural effusion. Electronically Signed   By: Kennith Center M.D.   On: 05/28/2023 08:41   MR ABDOMEN MRCP WO CONTRAST Result Date: 05/28/2023 CLINICAL DATA:  Pancreatitis. Main pancreatic ductal dilatation on CT imaging. EXAM: MRI ABDOMEN WITHOUT CONTRAST  (INCLUDING MRCP) TECHNIQUE: Multiplanar multisequence MR  imaging of the abdomen was performed. Heavily T2-weighted images of the biliary and pancreatic ducts were obtained, and three-dimensional MRCP images were rendered by post processing. COMPARISON:  CT scan 05/26/2023 FINDINGS: Lower chest: Atelectasis noted dependent left lung base with possible tiny left pleural effusion. Hepatobiliary: No focal abnormality identified in the liver on this study performed without intravenous contrast material. Diffuse low signal intensity in liver parenchyma on T2 weighted imaging suggests iron deposition. Tiny stones are seen in the gallbladder. No intrahepatic biliary duct dilatation. Common duct measures approximately 5 mm diameter. Common bile duct measures maximally at 6 mm diameter. No findings to suggest choledocholithiasis. Pancreas: As noted on CT imaging, there is diffuse distention of the main pancreatic duct through the body and tail of pancreas measuring up to 7 mm diameter in the pancreatic body. There is relatively rapid tapering of the main duct in the head of the pancreas with only a string sign of duct visible (image 22/3). Some reconstitution of the main duct into the ampulla is evident although duct in this region is nondilated. Assessment of the pancreas is limited by motion artifact and lack of intravenous contrast. Additionally, there are surgical clips and potentially a hemostatic clip in the antral region of the stomach generating substantial susceptibility artifact obscuring portions of the pancreatic head on some pulse sequences. Within this limitation, the does appears to be some loss of parenchymal architecture in the head of the pancreas in the region of pancreatic ductal narrowing (see axial image 25 of series 3). Spleen: Some diffuse low signal intensity in the splenic parenchyma on T2 imaging suggests iron deposition. Tiny anterior T2 hyperintensity may be a cyst or pseudocyst. Adrenals/Urinary Tract: Thickening of both adrenal glands evident without  a discrete nodule or mass. Kidneys are atrophic with innumerable tiny cortical T2 hyperintensities, too small to characterize but likely benign and probably cysts. 17 mm cyst noted lower interpolar right kidney with layering calcific or proteinaceous debris. No hydronephrosis on today's study. Stomach/Bowel: Stomach is decompressed with diffuse wall thickening, likely accentuated by the decompressed state. No evidence for small bowel obstruction. Vascular/Lymphatic: No abdominal aortic aneurysm. No abdominal lymphadenopathy. Other:  No substantial intraperitoneal free fluid. Musculoskeletal: No suspicious focal marrow signal abnormality. IMPRESSION: 1. Diffuse distention of the main pancreatic  duct through the body and tail of pancreas measuring up to 7 mm diameter in the pancreatic body. There is relatively rapid tapering of the main duct in the head of the pancreas with only a string sign of duct visible. Some reconstitution of the main duct into the ampulla is evident although duct in this region is nondilated. Assessment of the pancreas is limited by motion artifact and lack of intravenous contrast. Additionally, there are surgical clips and potentially a hemostatic clip in the antral region of the stomach generating substantial susceptibility artifact obscuring portions of the pancreatic head on some pulse sequences. Within this limitation, there does appears to be some loss of parenchymal architecture in the head of the pancreas in the region of pancreatic ductal narrowing. As such, pancreatic lesion/neoplasm cannot be excluded. Consider endoscopic ultrasound to further evaluate. 2. Cholelithiasis. No intrahepatic or extrahepatic biliary duct dilatation. No findings to suggest choledocholithiasis. 3. Diffuse low signal intensity in liver parenchyma on T2 weighted imaging suggests iron deposition. 4. Atrophic kidneys with innumerable tiny cortical T2 hyperintensities, too small to characterize but likely benign  and probably cysts. 5. Atelectasis dependent left lung base with possible tiny left pleural effusion. Electronically Signed   By: Kennith Center M.D.   On: 05/28/2023 08:41     LOS: 8 days   Jeoffrey Massed, MD  [facility]    To contact the attending provider between 7A-7P or the covering provider during after hours 7P-7A, please log into the web site [email or web address] and access using universal [email or web address] password for that web site. If you do not have the password, please call the hospital operator.  05/28/2023, 11:52 AM

## 2023-05-28 NOTE — Plan of Care (Signed)

## 2023-05-28 NOTE — Progress Notes (Signed)
 D/C order noted. Contacted [facility] to be advised of pt's d/c today and that pt should resume care tomorrow.   Olivia Canter Renal Navigator [phone]

## 2023-05-29 ENCOUNTER — Telehealth (HOSPITAL_COMMUNITY): Payer: Self-pay | Admitting: Nephrology

## 2023-05-29 ENCOUNTER — Other Ambulatory Visit: Payer: Self-pay

## 2023-05-29 ENCOUNTER — Telehealth: Payer: Self-pay

## 2023-05-29 DIAGNOSIS — Q453 Other congenital malformations of pancreas and pancreatic duct: Secondary | ICD-10-CM

## 2023-05-29 DIAGNOSIS — K8500 Idiopathic acute pancreatitis without necrosis or infection: Secondary | ICD-10-CM

## 2023-05-29 DIAGNOSIS — K859 Acute pancreatitis without necrosis or infection, unspecified: Secondary | ICD-10-CM

## 2023-05-29 NOTE — Telephone Encounter (Signed)
Transition of care contact from inpatient facility  Date of Discharge: 05/28/23 Date of Contact: 05/29/23  Method of contact: Phone  Attempted to contact patient to discuss transition of care from inpatient admission. Patient did not answer the phone. There was no ability to leave a voicemail message.  Ozzie Hoyle, PA-C [facility] [employer] Pager [phone]

## 2023-05-29 NOTE — Telephone Encounter (Signed)
-----   Message from [facility] sent at 05/28/2023  3:47 PM EST ----- Regarding: RE: Upper EUS in next 4 weeks. Please schedule. Thanks. GM ----- Message ----- From: Beverley Fiedler, MD Sent: 05/28/2023  12:02 PM EST To: Chrystie Nose, RN; Loretha Stapler, RN; #  GM On your return, can you please consider EUS for this pt with hx of pancreatitis and now abnormal PD and possible stricture versus occult lesion in panc head Hospitalized but going home soon May need stent perhaps? Thanks for your help. Theodoro Clock, Please get this pt and office visit with me or APP in 4 weeks (if possible) for hospital follow-up pancreatitis and also she will need EUS.  Thanks all,  HBD GM JMP

## 2023-05-29 NOTE — Telephone Encounter (Signed)
Transition of Care - Initial Contact from Inpatient Facility  Date of discharge: 05/28/23 Date of contact: 05/29/23 --Pt called back. Method: Phone Spoke to: Patient  Patient contacted to discuss transition of care from recent inpatient hospitalization. Patient was admitted to [facility] from 12/11-12/19/24 with discharge diagnosis of abd pain (acute on chronic pancreatitis) + diarrhea.  The discharge medication list was reviewed. Patient understands the changes and has no concerns.   She attended HD this morning per usual MWF schedule - did well. No issues.  No other concerns at this time.  Ozzie Hoyle, PA-C [facility] [employer] Pager [phone]

## 2023-05-29 NOTE — Telephone Encounter (Signed)
EUS has been set up for 07/06/23 at [facility] with GM   Left message on machine to call back

## 2023-05-29 NOTE — Telephone Encounter (Signed)
EUS scheduled, pt instructed and medications reviewed.  Patient instructions mailed to home.  Patient to call with any questions or concerns.  

## 2023-06-15 NOTE — Telephone Encounter (Signed)
 Appt has been moved to 08/20/23 at 1245 pm at [facility] with [facility] due to a change in Dr Lestine Mount schedule.    Left message on machine to call back

## 2023-06-16 NOTE — Telephone Encounter (Signed)
EUS re-scheduled, pt instructed and medications reviewed.  Patient instructions mailed to home.  Patient to call with any questions or concerns.

## 2023-06-17 ENCOUNTER — Encounter (HOSPITAL_COMMUNITY): Payer: Self-pay

## 2023-06-17 ENCOUNTER — Other Ambulatory Visit: Payer: Self-pay

## 2023-06-17 ENCOUNTER — Emergency Department (HOSPITAL_COMMUNITY): Payer: 59

## 2023-06-17 ENCOUNTER — Inpatient Hospital Stay (HOSPITAL_COMMUNITY)
Admission: EM | Admit: 2023-06-17 | Discharge: 2023-06-20 | DRG: 438 | Disposition: A | Discharge status: Home / Self Care | Payer: 59 | Attending: Internal Medicine | Admitting: Internal Medicine

## 2023-06-17 DIAGNOSIS — K859 Acute pancreatitis without necrosis or infection, unspecified: Secondary | ICD-10-CM | POA: Diagnosis not present

## 2023-06-17 DIAGNOSIS — Z8719 Personal history of other diseases of the digestive system: Secondary | ICD-10-CM

## 2023-06-17 DIAGNOSIS — J441 Chronic obstructive pulmonary disease with (acute) exacerbation: Secondary | ICD-10-CM | POA: Diagnosis present

## 2023-06-17 DIAGNOSIS — E875 Hyperkalemia: Secondary | ICD-10-CM | POA: Diagnosis present

## 2023-06-17 DIAGNOSIS — F17210 Nicotine dependence, cigarettes, uncomplicated: Secondary | ICD-10-CM | POA: Diagnosis present

## 2023-06-17 DIAGNOSIS — R011 Cardiac murmur, unspecified: Secondary | ICD-10-CM | POA: Diagnosis present

## 2023-06-17 DIAGNOSIS — N2581 Secondary hyperparathyroidism of renal origin: Secondary | ICD-10-CM | POA: Diagnosis present

## 2023-06-17 DIAGNOSIS — Z803 Family history of malignant neoplasm of breast: Secondary | ICD-10-CM

## 2023-06-17 DIAGNOSIS — Z860101 Personal history of adenomatous and serrated colon polyps: Secondary | ICD-10-CM

## 2023-06-17 DIAGNOSIS — Z8711 Personal history of peptic ulcer disease: Secondary | ICD-10-CM

## 2023-06-17 DIAGNOSIS — Z841 Family history of disorders of kidney and ureter: Secondary | ICD-10-CM

## 2023-06-17 DIAGNOSIS — Z79899 Other long term (current) drug therapy: Secondary | ICD-10-CM

## 2023-06-17 DIAGNOSIS — R112 Nausea with vomiting, unspecified: Secondary | ICD-10-CM | POA: Diagnosis present

## 2023-06-17 DIAGNOSIS — K8681 Exocrine pancreatic insufficiency: Secondary | ICD-10-CM | POA: Diagnosis present

## 2023-06-17 DIAGNOSIS — R109 Unspecified abdominal pain: Principal | ICD-10-CM | POA: Diagnosis present

## 2023-06-17 DIAGNOSIS — Z8619 Personal history of other infectious and parasitic diseases: Secondary | ICD-10-CM

## 2023-06-17 DIAGNOSIS — Z83430 Family history of elevated lipoprotein(a): Secondary | ICD-10-CM

## 2023-06-17 DIAGNOSIS — Z9889 Other specified postprocedural states: Secondary | ICD-10-CM

## 2023-06-17 DIAGNOSIS — Z8249 Family history of ischemic heart disease and other diseases of the circulatory system: Secondary | ICD-10-CM

## 2023-06-17 DIAGNOSIS — K7460 Unspecified cirrhosis of liver: Secondary | ICD-10-CM | POA: Diagnosis present

## 2023-06-17 DIAGNOSIS — Z7952 Long term (current) use of systemic steroids: Secondary | ICD-10-CM

## 2023-06-17 DIAGNOSIS — Z716 Tobacco abuse counseling: Secondary | ICD-10-CM

## 2023-06-17 DIAGNOSIS — Z8673 Personal history of transient ischemic attack (TIA), and cerebral infarction without residual deficits: Secondary | ICD-10-CM

## 2023-06-17 DIAGNOSIS — I120 Hypertensive chronic kidney disease with stage 5 chronic kidney disease or end stage renal disease: Secondary | ICD-10-CM | POA: Diagnosis present

## 2023-06-17 DIAGNOSIS — G8929 Other chronic pain: Secondary | ICD-10-CM | POA: Diagnosis present

## 2023-06-17 DIAGNOSIS — N186 End stage renal disease: Secondary | ICD-10-CM | POA: Diagnosis present

## 2023-06-17 DIAGNOSIS — Z809 Family history of malignant neoplasm, unspecified: Secondary | ICD-10-CM

## 2023-06-17 DIAGNOSIS — J9601 Acute respiratory failure with hypoxia: Secondary | ICD-10-CM | POA: Diagnosis not present

## 2023-06-17 DIAGNOSIS — E785 Hyperlipidemia, unspecified: Secondary | ICD-10-CM | POA: Diagnosis present

## 2023-06-17 DIAGNOSIS — D631 Anemia in chronic kidney disease: Secondary | ICD-10-CM | POA: Diagnosis present

## 2023-06-17 DIAGNOSIS — Z83438 Family history of other disorder of lipoprotein metabolism and other lipidemia: Secondary | ICD-10-CM

## 2023-06-17 DIAGNOSIS — K861 Other chronic pancreatitis: Secondary | ICD-10-CM | POA: Diagnosis present

## 2023-06-17 DIAGNOSIS — Z886 Allergy status to analgesic agent status: Secondary | ICD-10-CM

## 2023-06-17 DIAGNOSIS — J849 Interstitial pulmonary disease, unspecified: Secondary | ICD-10-CM | POA: Diagnosis present

## 2023-06-17 DIAGNOSIS — Z992 Dependence on renal dialysis: Secondary | ICD-10-CM

## 2023-06-17 DIAGNOSIS — Z86018 Personal history of other benign neoplasm: Secondary | ICD-10-CM

## 2023-06-17 DIAGNOSIS — K648 Other hemorrhoids: Secondary | ICD-10-CM | POA: Diagnosis present

## 2023-06-17 DIAGNOSIS — Z5982 Transportation insecurity: Secondary | ICD-10-CM

## 2023-06-17 DIAGNOSIS — I160 Hypertensive urgency: Secondary | ICD-10-CM | POA: Diagnosis present

## 2023-06-17 DIAGNOSIS — Z820 Family history of epilepsy and other diseases of the nervous system: Secondary | ICD-10-CM

## 2023-06-17 DIAGNOSIS — Z90710 Acquired absence of both cervix and uterus: Secondary | ICD-10-CM

## 2023-06-17 DIAGNOSIS — K219 Gastro-esophageal reflux disease without esophagitis: Secondary | ICD-10-CM | POA: Diagnosis present

## 2023-06-17 LAB — CBC
HCT: 40.7 % (ref 36.0–46.0)
Hemoglobin: 13.1 g/dL (ref 12.0–15.0)
MCH: 28.5 pg (ref 26.0–34.0)
MCHC: 32.2 g/dL (ref 30.0–36.0)
MCV: 88.7 fL (ref 80.0–100.0)
Platelets: 229 10*3/uL (ref 150–400)
RBC: 4.59 MIL/uL (ref 3.87–5.11)
RDW: 22.5 % — ABNORMAL HIGH (ref 11.5–15.5)
WBC: 8 10*3/uL (ref 4.0–10.5)
nRBC: 0 % (ref 0.0–0.2)

## 2023-06-17 LAB — COMPREHENSIVE METABOLIC PANEL
ALT: 9 U/L (ref 0–44)
AST: 16 U/L (ref 15–41)
Albumin: 3.4 g/dL — ABNORMAL LOW (ref 3.5–5.0)
Alkaline Phosphatase: 76 U/L (ref 38–126)
Anion gap: 16 — ABNORMAL HIGH (ref 5–15)
BUN: 31 mg/dL — ABNORMAL HIGH (ref 8–23)
CO2: 26 mmol/L (ref 22–32)
Calcium: 7.3 mg/dL — ABNORMAL LOW (ref 8.9–10.3)
Chloride: 98 mmol/L (ref 98–111)
Creatinine, Ser: 9.12 mg/dL — ABNORMAL HIGH (ref 0.44–1.00)
GFR, Estimated: 4 mL/min — ABNORMAL LOW (ref >60)
Glucose, Bld: 94 mg/dL (ref 70–99)
Potassium: 5.4 mmol/L — ABNORMAL HIGH (ref 3.5–5.1)
Sodium: 140 mmol/L (ref 135–145)
Total Bilirubin: 0.7 mg/dL (ref 0.0–1.2)
Total Protein: 7.2 g/dL (ref 6.5–8.1)

## 2023-06-17 LAB — MAGNESIUM: Magnesium: 1.8 mg/dL (ref 1.7–2.4)

## 2023-06-17 LAB — LIPASE, BLOOD: Lipase: 73 U/L — ABNORMAL HIGH (ref 11–51)

## 2023-06-17 MED ORDER — ONDANSETRON HCL 4 MG/2ML IJ SOLN
4.0000 mg | Freq: Once | INTRAMUSCULAR | Status: AC
  Administered 2023-06-17: 4 mg via INTRAVENOUS
  Filled 2023-06-17: qty 2

## 2023-06-17 MED ORDER — MORPHINE SULFATE (PF) 4 MG/ML IV SOLN
4.0000 mg | Freq: Once | INTRAVENOUS | Status: AC
  Administered 2023-06-17: 4 mg via INTRAVENOUS
  Filled 2023-06-17: qty 1

## 2023-06-17 MED ORDER — SODIUM CHLORIDE 0.9 % IV BOLUS
1000.0000 mL | Freq: Once | INTRAVENOUS | Status: AC
  Administered 2023-06-17: 1000 mL via INTRAVENOUS

## 2023-06-17 NOTE — ED Notes (Signed)
 Patient transported to CT

## 2023-06-17 NOTE — ED Provider Triage Note (Signed)
 Emergency Medicine Provider Triage Evaluation Note  Ashley [name] , a 73 y.o. female  was evaluated in triage.  Pt complains of abdominal pain.  Going on for about 3 to 4 days.  Patient has a history of chronic pancreatitis.  Was recently in the hospital had an MRCP concerning for a stricture she had improved clinically and was discharged home with plans to have a endoscopic ultrasound in the future..  Review of Systems  Positive: Abdominal pain Negative: Fever, vomiting  Physical Exam  BP (!) 155/63   Pulse 73   Temp 98.5 F (36.9 C) (Oral)   Resp 17   SpO2 95%  Gen:   Awake, no distress   Resp:  Normal effort  MSK:   Moves extremities without difficulty  Other:  Diffuse abdominal pain worse in the epigastrium  Medical Decision Making  Medically screening exam initiated at 3:30 PM.  Appropriate orders placed.  Ashley [name] was informed that the remainder of the evaluation will be completed by another provider, this initial triage assessment does not replace that evaluation, and the importance of remaining in the ED until their evaluation is complete.  73 yo F with a chief complaints of abdominal pain.  Patient has a history of recurrent episodes of pancreatitis.  Was recently in the hospital and had an MRCP with some concern for stricture of the pancreatic duct.  She had improved clinically and so was discharged home.  Plan for endoscopic ultrasound.  Unfortunately patient is worsened over the past 4 days.  Will obtain a laboratory evaluation.  CT imaging.   Emil Share, DO 06/17/23 1531

## 2023-06-17 NOTE — ED Triage Notes (Signed)
 PT BIB EMS from home upper abd pain x 3 days. Feels like pancreatitis. No n/v pt has had diarrhea  Vs 138/70 80 99%ra Rr 14   CBG 101

## 2023-06-17 NOTE — ED Provider Notes (Signed)
 [place] EMERGENCY DEPARTMENT AT [facility] Provider Note   CSN: [id number] Arrival date & time: 06/17/23  1517     History  Chief Complaint  Patient presents with   Abdominal Pain    Ashley [name] is a 73 y.o. female.  HPI   73 year old female with past medical history of chronic pancreatitis presents emergency department with concern for upper abdominal pain, diarrhea.  Patient states this has been ongoing for the past 3 to 4 days.  She recently was admitted to the hospital secondary to the symptoms, had an MRCP with concern for stricture, she clinically improved and was discharged.  She states since then she has had worsening abdominal pain and symptoms.  Sounds like there was plan for endoscopic ultrasound.  She is endorsing upper abdominal pain with nonbloody diarrhea, chills and decreased appetite.  Home Medications Prior to Admission medications   Medication Sig Start Date End Date Taking? Authorizing Provider  amLODipine  (NORVASC ) 10 MG tablet Take 1 tablet (10 mg total) by mouth daily. Patient taking differently: Take 10 mg by mouth daily at 12 noon. 11/11/16   Phelps, Jazma Y, DO  CREON  12000-38000 units CPEP capsule Take 4 capsules by mouth 2 (two) times daily with a meal. 03/02/23   [provider]  gabapentin  (NEURONTIN ) 100 MG capsule Take 200 mg by mouth at bedtime.    [provider]  hydrALAZINE  (APRESOLINE ) 25 MG tablet Take 3 tablets (75 mg total) by mouth every 8 (eight) hours. 11/14/20 03/09/24  [place], Eric J, DO  oxyCODONE  (OXY IR/ROXICODONE ) 5 MG immediate release tablet Take 1 tablet (5 mg total) by mouth every 6 (six) hours as needed for severe pain (pain score 7-10). 05/28/23   Ghimire, Donalda HERO, MD  pantoprazole  (PROTONIX ) 40 MG tablet Take 1 tablet (40 mg total) by mouth daily. 03/05/23 06/03/23  Barrett, Warren SAILOR, PA-C  sevelamer  carbonate (RENVELA ) 800 MG tablet Take 800 mg by mouth 3 (three) times daily with meals. 07/23/20    [provider]      Allergies    Aspirin  and Ibuprofen    Review of Systems   Review of Systems  Constitutional:  Positive for appetite change, chills and fatigue. Negative for fever.  Respiratory:  Negative for shortness of breath.   Cardiovascular:  Negative for chest pain.  Gastrointestinal:  Positive for abdominal pain and diarrhea. Negative for blood in stool, nausea and vomiting.  Skin:  Negative for rash.  Neurological:  Negative for headaches.    Physical Exam Updated Vital Signs BP (!) 163/72 (BP Location: Right Arm)   Pulse 68   Temp 97.7 F (36.5 C) (Oral)   Resp 16   SpO2 98%  Physical Exam Vitals and nursing note reviewed.  Constitutional:      General: She is not in acute distress.    Appearance: Normal appearance.  HENT:     Head: Normocephalic.     Mouth/Throat:     Mouth: Mucous membranes are moist.  Cardiovascular:     Rate and Rhythm: Normal rate.  Pulmonary:     Effort: Pulmonary effort is normal. No respiratory distress.  Abdominal:     General: Bowel sounds are normal. There is no distension.     Palpations: Abdomen is soft.     Tenderness: There is generalized abdominal tenderness and tenderness in the right upper quadrant, epigastric area and left upper quadrant. There is guarding.  Skin:    General: Skin is warm.  Neurological:  Mental Status: She is alert and oriented to person, place, and time. Mental status is at baseline.  Psychiatric:        Mood and Affect: Mood normal.     ED Results / Procedures / Treatments   Labs (all labs ordered are listed, but only abnormal results are displayed) Labs Reviewed  CBC - Abnormal; Notable for the following components:      Result Value   RDW 22.5 (*)    All other components within normal limits  COMPREHENSIVE METABOLIC PANEL - Abnormal; Notable for the following components:   Potassium 5.4 (*)    BUN 31 (*)    Creatinine, Ser 9.12 (*)    Calcium  7.3 (*)    Albumin  3.4 (*)     GFR, Estimated 4 (*)    Anion gap 16 (*)    All other components within normal limits  LIPASE, BLOOD - Abnormal; Notable for the following components:   Lipase 73 (*)    All other components within normal limits  MAGNESIUM  URINALYSIS, ROUTINE W REFLEX MICROSCOPIC    EKG None  Radiology No results found.  Procedures Procedures    Medications Ordered in ED Medications  morphine  (PF) 4 MG/ML injection 4 mg (has no administration in time range)  ondansetron  (ZOFRAN ) injection 4 mg (has no administration in time range)  morphine  (PF) 4 MG/ML injection 4 mg (4 mg Intravenous Given 06/17/23 1553)  ondansetron  (ZOFRAN ) injection 4 mg (4 mg Intravenous Given 06/17/23 1553)  sodium chloride  0.9 % bolus 1,000 mL (0 mLs Intravenous Stopped 06/17/23 1744)    ED Course/ Medical Decision Making/ A&P                                 Medical Decision Making Amount and/or Complexity of Data Reviewed Labs: ordered. Radiology: ordered.  Risk Prescription drug management. Decision regarding hospitalization.   73 year old female presents emergency department ongoing abdominal pain, diarrhea.  Similar to her previous admission where she was found to have pancreatic duct dilation, had an MRCP done with [facility].  She is planned for outpatient endoscopic ultrasound.  Here with worsening symptoms.  Blood work is reassuring around baseline compared to her last admission.  CT of the abdomen and ultrasound do not show any acute findings of cholecystitis or other changes.  Of note originally the CT was ordered with IV contrast from triage, she did have a mild amount of infiltration in the right forearm, this is being treated appropriately.  Patient's requiring multiple IV rounds in terms of symptom control.  Consulted with gastroenterologist, Dr. Stacia.  Patient will be admitted medically for symptom control and they will consult to see if there is anything further they can have during this  admission.  Patients evaluation and results requires admission for further treatment and care.  Spoke with hospitalist, reviewed patient's ED course and they accept admission.  Patient agrees with admission plan, offers no new complaints and is stable/unchanged at time of admit.        Final Clinical Impression(s) / ED Diagnoses Final diagnoses:  None    Rx / DC Orders ED Discharge Orders     None         Bari Roxie HERO, DO 06/18/23 0030

## 2023-06-18 ENCOUNTER — Inpatient Hospital Stay (HOSPITAL_COMMUNITY): Payer: 59

## 2023-06-18 ENCOUNTER — Encounter (HOSPITAL_COMMUNITY): Payer: Self-pay | Admitting: Internal Medicine

## 2023-06-18 DIAGNOSIS — K219 Gastro-esophageal reflux disease without esophagitis: Secondary | ICD-10-CM | POA: Diagnosis present

## 2023-06-18 DIAGNOSIS — E785 Hyperlipidemia, unspecified: Secondary | ICD-10-CM | POA: Diagnosis present

## 2023-06-18 DIAGNOSIS — Z803 Family history of malignant neoplasm of breast: Secondary | ICD-10-CM | POA: Diagnosis not present

## 2023-06-18 DIAGNOSIS — R011 Cardiac murmur, unspecified: Secondary | ICD-10-CM | POA: Diagnosis present

## 2023-06-18 DIAGNOSIS — J9601 Acute respiratory failure with hypoxia: Secondary | ICD-10-CM | POA: Diagnosis not present

## 2023-06-18 DIAGNOSIS — D631 Anemia in chronic kidney disease: Secondary | ICD-10-CM | POA: Diagnosis present

## 2023-06-18 DIAGNOSIS — R1084 Generalized abdominal pain: Secondary | ICD-10-CM | POA: Diagnosis not present

## 2023-06-18 DIAGNOSIS — N2581 Secondary hyperparathyroidism of renal origin: Secondary | ICD-10-CM | POA: Diagnosis present

## 2023-06-18 DIAGNOSIS — F17210 Nicotine dependence, cigarettes, uncomplicated: Secondary | ICD-10-CM | POA: Diagnosis present

## 2023-06-18 DIAGNOSIS — R1010 Upper abdominal pain, unspecified: Secondary | ICD-10-CM | POA: Diagnosis not present

## 2023-06-18 DIAGNOSIS — R112 Nausea with vomiting, unspecified: Secondary | ICD-10-CM | POA: Diagnosis present

## 2023-06-18 DIAGNOSIS — R197 Diarrhea, unspecified: Secondary | ICD-10-CM

## 2023-06-18 DIAGNOSIS — K859 Acute pancreatitis without necrosis or infection, unspecified: Secondary | ICD-10-CM

## 2023-06-18 DIAGNOSIS — Z8249 Family history of ischemic heart disease and other diseases of the circulatory system: Secondary | ICD-10-CM | POA: Diagnosis not present

## 2023-06-18 DIAGNOSIS — I160 Hypertensive urgency: Secondary | ICD-10-CM | POA: Diagnosis present

## 2023-06-18 DIAGNOSIS — K8681 Exocrine pancreatic insufficiency: Secondary | ICD-10-CM | POA: Diagnosis present

## 2023-06-18 DIAGNOSIS — J849 Interstitial pulmonary disease, unspecified: Secondary | ICD-10-CM | POA: Diagnosis present

## 2023-06-18 DIAGNOSIS — Z992 Dependence on renal dialysis: Secondary | ICD-10-CM

## 2023-06-18 DIAGNOSIS — N186 End stage renal disease: Secondary | ICD-10-CM | POA: Diagnosis present

## 2023-06-18 DIAGNOSIS — J441 Chronic obstructive pulmonary disease with (acute) exacerbation: Secondary | ICD-10-CM | POA: Diagnosis present

## 2023-06-18 DIAGNOSIS — Z83438 Family history of other disorder of lipoprotein metabolism and other lipidemia: Secondary | ICD-10-CM | POA: Diagnosis not present

## 2023-06-18 DIAGNOSIS — G8929 Other chronic pain: Secondary | ICD-10-CM | POA: Diagnosis present

## 2023-06-18 DIAGNOSIS — K861 Other chronic pancreatitis: Secondary | ICD-10-CM

## 2023-06-18 DIAGNOSIS — K648 Other hemorrhoids: Secondary | ICD-10-CM | POA: Diagnosis present

## 2023-06-18 DIAGNOSIS — I120 Hypertensive chronic kidney disease with stage 5 chronic kidney disease or end stage renal disease: Secondary | ICD-10-CM | POA: Diagnosis present

## 2023-06-18 DIAGNOSIS — Z5982 Transportation insecurity: Secondary | ICD-10-CM | POA: Diagnosis not present

## 2023-06-18 DIAGNOSIS — K7460 Unspecified cirrhosis of liver: Secondary | ICD-10-CM | POA: Diagnosis present

## 2023-06-18 DIAGNOSIS — E875 Hyperkalemia: Secondary | ICD-10-CM | POA: Diagnosis present

## 2023-06-18 LAB — COMPREHENSIVE METABOLIC PANEL
ALT: 9 U/L (ref 0–44)
AST: 15 U/L (ref 15–41)
Albumin: 3.1 g/dL — ABNORMAL LOW (ref 3.5–5.0)
Alkaline Phosphatase: 67 U/L (ref 38–126)
Anion gap: 13 (ref 5–15)
BUN: 32 mg/dL — ABNORMAL HIGH (ref 8–23)
CO2: 24 mmol/L (ref 22–32)
Calcium: 7.3 mg/dL — ABNORMAL LOW (ref 8.9–10.3)
Chloride: 101 mmol/L (ref 98–111)
Creatinine, Ser: 10.06 mg/dL — ABNORMAL HIGH (ref 0.44–1.00)
GFR, Estimated: 4 mL/min — ABNORMAL LOW (ref >60)
Glucose, Bld: 85 mg/dL (ref 70–99)
Potassium: 5.9 mmol/L — ABNORMAL HIGH (ref 3.5–5.1)
Sodium: 138 mmol/L (ref 135–145)
Total Bilirubin: 0.6 mg/dL (ref 0.0–1.2)
Total Protein: 6.4 g/dL — ABNORMAL LOW (ref 6.5–8.1)

## 2023-06-18 LAB — CBC
HCT: 34.4 % — ABNORMAL LOW (ref 36.0–46.0)
HCT: 35.3 % — ABNORMAL LOW (ref 36.0–46.0)
Hemoglobin: 10.7 g/dL — ABNORMAL LOW (ref 12.0–15.0)
Hemoglobin: 10.9 g/dL — ABNORMAL LOW (ref 12.0–15.0)
MCH: 27.6 pg (ref 26.0–34.0)
MCH: 27.9 pg (ref 26.0–34.0)
MCHC: 31.1 g/dL (ref 30.0–36.0)
MCHC: 30.9 g/dL (ref 30.0–36.0)
MCV: 88.7 fL (ref 80.0–100.0)
MCV: 90.5 fL (ref 80.0–100.0)
Platelets: 202 10*3/uL (ref 150–400)
Platelets: 223 10*3/uL (ref 150–400)
RBC: 3.88 MIL/uL (ref 3.87–5.11)
RBC: 3.9 MIL/uL (ref 3.87–5.11)
RDW: 21.7 % — ABNORMAL HIGH (ref 11.5–15.5)
RDW: 21.5 % — ABNORMAL HIGH (ref 11.5–15.5)
WBC: 7.3 10*3/uL (ref 4.0–10.5)
WBC: 6.8 10*3/uL (ref 4.0–10.5)
nRBC: 0 % (ref 0.0–0.2)
nRBC: 0 % (ref 0.0–0.2)

## 2023-06-18 LAB — RENAL FUNCTION PANEL
Albumin: 3.1 g/dL — ABNORMAL LOW (ref 3.5–5.0)
Anion gap: 16 — ABNORMAL HIGH (ref 5–15)
BUN: 38 mg/dL — ABNORMAL HIGH (ref 8–23)
CO2: 24 mmol/L (ref 22–32)
Calcium: 7.5 mg/dL — ABNORMAL LOW (ref 8.9–10.3)
Chloride: 100 mmol/L (ref 98–111)
Creatinine, Ser: 10.01 mg/dL — ABNORMAL HIGH (ref 0.44–1.00)
GFR, Estimated: 4 mL/min — ABNORMAL LOW (ref >60)
Glucose, Bld: 77 mg/dL (ref 70–99)
Phosphorus: 6 mg/dL — ABNORMAL HIGH (ref 2.5–4.6)
Potassium: 6.3 mmol/L (ref 3.5–5.1)
Sodium: 140 mmol/L (ref 135–145)

## 2023-06-18 LAB — GLUCOSE, CAPILLARY
Glucose-Capillary: 55 mg/dL — ABNORMAL LOW (ref 70–99)
Glucose-Capillary: 115 mg/dL — ABNORMAL HIGH (ref 70–99)

## 2023-06-18 LAB — CBG MONITORING, ED: Glucose-Capillary: 79 mg/dL (ref 70–99)

## 2023-06-18 MED ORDER — ANTICOAGULANT SODIUM CITRATE 4% (200MG/5ML) IV SOLN: 5.0000 mL | Status: DC | PRN

## 2023-06-18 MED ORDER — SEVELAMER CARBONATE 800 MG PO TABS
800.0000 mg | ORAL_TABLET | Freq: Three times a day (TID) | ORAL | Status: DC
  Administered 2023-06-18 – 2023-06-20 (×4): 800 mg via ORAL
  Filled 2023-06-18 (×5): qty 1

## 2023-06-18 MED ORDER — LIDOCAINE HCL (PF) 1 % IJ SOLN: 5.0000 mL | INTRAMUSCULAR | Status: DC | PRN

## 2023-06-18 MED ORDER — LIDOCAINE HCL (PF) 1 % IJ SOLN
5.0000 mL | INTRAMUSCULAR | Status: DC | PRN
Start: 2023-06-18 — End: 2023-06-18

## 2023-06-18 MED ORDER — PENTAFLUOROPROP-TETRAFLUOROETH EX AERO: 1.0000 | INHALATION_SPRAY | CUTANEOUS | Status: DC | PRN

## 2023-06-18 MED ORDER — PANCRELIPASE (LIP-PROT-AMYL) 12000-38000 UNITS PO CPEP
48000.0000 [IU] | ORAL_CAPSULE | Freq: Two times a day (BID) | ORAL | Status: DC
  Administered 2023-06-19 – 2023-06-20 (×2): 48000 [IU] via ORAL
  Filled 2023-06-18 (×7): qty 1

## 2023-06-18 MED ORDER — LIDOCAINE-PRILOCAINE 2.5-2.5 % EX CREA: 1.0000 | TOPICAL_CREAM | CUTANEOUS | Status: DC | PRN

## 2023-06-18 MED ORDER — HEPARIN SODIUM (PORCINE) 1000 UNIT/ML DIALYSIS: 1000.0000 [IU] | INTRAMUSCULAR | Status: DC | PRN

## 2023-06-18 MED ORDER — DOXERCALCIFEROL 4 MCG/2ML IV SOLN
6.0000 ug | INTRAVENOUS | Status: AC
  Administered 2023-06-18 – 2023-06-20 (×2): 6 ug via INTRAVENOUS
  Filled 2023-06-18 (×4): qty 4

## 2023-06-18 MED ORDER — HYDRALAZINE HCL 50 MG PO TABS
75.0000 mg | ORAL_TABLET | Freq: Three times a day (TID) | ORAL | Status: DC
  Administered 2023-06-18 – 2023-06-20 (×6): 75 mg via ORAL
  Filled 2023-06-18 (×5): qty 1
  Filled 2023-06-18: qty 3

## 2023-06-18 MED ORDER — FENTANYL CITRATE PF 50 MCG/ML IJ SOSY
12.5000 ug | PREFILLED_SYRINGE | INTRAMUSCULAR | Status: AC | PRN
  Administered 2023-06-18 (×4): 12.5 ug via INTRAVENOUS
  Filled 2023-06-18 (×4): qty 1

## 2023-06-18 MED ORDER — CHLORHEXIDINE GLUCONATE CLOTH 2 % EX PADS
6.0000 | MEDICATED_PAD | Freq: Every day | CUTANEOUS | Status: DC
  Administered 2023-06-19 – 2023-06-20 (×2): 6 via TOPICAL

## 2023-06-18 MED ORDER — HEPARIN SODIUM (PORCINE) 1000 UNIT/ML DIALYSIS
1000.0000 [IU] | INTRAMUSCULAR | Status: DC | PRN
Start: 2023-06-18 — End: 2023-06-18

## 2023-06-18 MED ORDER — DOXERCALCIFEROL 4 MCG/2ML IV SOLN: 6.0000 ug | INTRAVENOUS | Status: DC

## 2023-06-18 MED ORDER — DEXTROSE 50 % IV SOLN
12.5000 g | INTRAVENOUS | Status: AC
  Administered 2023-06-18: 12.5 g via INTRAVENOUS
  Filled 2023-06-18: qty 50

## 2023-06-18 MED ORDER — SODIUM ZIRCONIUM CYCLOSILICATE 10 G PO PACK
10.0000 g | PACK | Freq: Every day | ORAL | Status: DC
  Administered 2023-06-18: 10 g via ORAL
  Filled 2023-06-18: qty 1

## 2023-06-18 MED ORDER — NEPRO/CARBSTEADY PO LIQD: 237.0000 mL | ORAL | Status: DC | PRN

## 2023-06-18 MED ORDER — ALTEPLASE 2 MG IJ SOLR: 2.0000 mg | Freq: Once | INTRAMUSCULAR | Status: DC | PRN

## 2023-06-18 MED ORDER — AMLODIPINE BESYLATE 10 MG PO TABS
10.0000 mg | ORAL_TABLET | Freq: Every day | ORAL | Status: DC
  Administered 2023-06-18 – 2023-06-19 (×2): 10 mg via ORAL
  Filled 2023-06-18: qty 1
  Filled 2023-06-18: qty 2
  Filled 2023-06-18: qty 1

## 2023-06-18 MED ORDER — PANTOPRAZOLE SODIUM 40 MG PO TBEC: 40.0000 mg | DELAYED_RELEASE_TABLET | Freq: Every day | ORAL | Status: DC

## 2023-06-18 MED ORDER — PANTOPRAZOLE SODIUM 40 MG IV SOLR
40.0000 mg | INTRAVENOUS | Status: DC
  Administered 2023-06-18 – 2023-06-19 (×2): 40 mg via INTRAVENOUS
  Filled 2023-06-18 (×2): qty 10

## 2023-06-18 NOTE — H&P (Signed)
 History and Physical    Ashley [name] FMW:[id number] DOB: 10-29-1950 DOA: 06/17/2023  Patient coming from: Home.  Chief Complaint: Abdominal pain.  HPI: Ashley [name] is a 73 y.o. 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  Patient also has finding it difficult to eat because of the pain.  ED Course: In the ER CT abdomen pelvis does not show any acute peripancreatic inflammatory changes but possible gastric wall thickening.  Ultrasound of the abdomen was showing possibility of cirrhotic changes.  Labs show lipase of 73 total bilirubin of 0.7.  WBC count of 8.  ER physician discussed with Dr. Stacia on-call gastroenterologist who is planning to see patient in consult.  Review of Systems: As per HPI, rest all negative.   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  genotype 2B.  Epclusa  started early 04/2020.     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  Chronic microvascular ischemic changes w parenchymal volume loss.  Chronic white matter periventricular microhemorrhage, likely due to htn   Ulcer 1990   gastric ulcer. Ruptured s/p emergency repair    Past Surgical History:  Procedure Laterality Date   ABDOMINAL HYSTERECTOMY  1979   AV FISTULA PLACEMENT Left 06/16/2017   Procedure: ARTERIOVENOUS (AV) FISTULA CREATION LEFT ARM;  Surgeon: Laurence Redell CROME, MD;  Location: [facility] OR;  Service: Vascular;  Laterality: Left;   BASCILIC VEIN TRANSPOSITION Left 10/02/2017   Procedure: BASILIC VEIN TRANSPOSITION SECOND STAGE LEFT ARM;  Surgeon: Oris Krystal FALCON, MD;  Location: [facility] OR;  Service: Vascular;  Laterality: Left;   BIOPSY  09/06/2019   Procedure: BIOPSY;  Surgeon: Avram Lupita FORBES, MD;  Location: [facility] ENDOSCOPY;  Service: Endoscopy;;   BIOPSY  03/13/2023   Procedure: BIOPSY;  Surgeon: San Sandor GAILS, DO;  Location: [facility] ENDOSCOPY;  Service: Gastroenterology;;   COLONOSCOPY WITH PROPOFOL  N/A 03/13/2023   Procedure: COLONOSCOPY WITH PROPOFOL ;  Surgeon: San Sandor GAILS, DO;  Location: [facility] ENDOSCOPY;  Service: Gastroenterology;  Laterality: N/A;   ENTEROSCOPY N/A 10/22/2022   Procedure: ENTEROSCOPY;  Surgeon: Legrand Victory CROME DOUGLAS, MD;  Location: [facility] ENDOSCOPY;  Service: Gastroenterology;  Laterality: N/A;   ESOPHAGOGASTRODUODENOSCOPY N/A 05/29/2013   Procedure: ESOPHAGOGASTRODUODENOSCOPY (EGD);  Surgeon: Gordy CHRISTELLA Starch, MD;  Location: [facility] ENDOSCOPY;  Service: Endoscopy;  Laterality: N/A;   ESOPHAGOGASTRODUODENOSCOPY  05/2013   for epigastric pain.  Nonobstructing Schatzki ring at GEJ, mild gastropathy, nonbleeding AVMs in bulb and D2. 5  mm sessile polyp in bulb.   ESOPHAGOGASTRODUODENOSCOPY (EGD) WITH PROPOFOL  N/A 09/06/2019   Procedure: ESOPHAGOGASTRODUODENOSCOPY (EGD) WITH PROPOFOL ;  Surgeon: Avram Lupita BRAVO, MD;  Location: [facility] ENDOSCOPY;  Service: Endoscopy;  Laterality: N/A;   ESOPHAGOGASTRODUODENOSCOPY (EGD) WITH PROPOFOL  N/A 02/02/2020   Procedure: ESOPHAGOGASTRODUODENOSCOPY (EGD) WITH PROPOFOL ;  Surgeon: Teressa Toribio SQUIBB, MD;   Location: [facility] ENDOSCOPY;  Service: Endoscopy;  Laterality: N/A;   ESOPHAGOGASTRODUODENOSCOPY (EGD) WITH PROPOFOL  N/A 02/07/2021   Procedure: ESOPHAGOGASTRODUODENOSCOPY (EGD) WITH PROPOFOL ;  Surgeon: Teressa Toribio SQUIBB, MD;  Location: [facility] ENDOSCOPY;  Service: Endoscopy;  Laterality: N/A;   ESOPHAGOGASTRODUODENOSCOPY (EGD) WITH PROPOFOL  N/A 03/13/2023   Procedure: ESOPHAGOGASTRODUODENOSCOPY (EGD) WITH PROPOFOL ;  Surgeon: San Sandor GAILS, DO;  Location: [facility] ENDOSCOPY;  Service: Gastroenterology;  Laterality: N/A;   EUS N/A 02/02/2020   Procedure: UPPER ENDOSCOPIC ULTRASOUND (EUS) RADIAL;  Surgeon: Teressa Toribio SQUIBB, MD;  Location: [facility] ENDOSCOPY;  Service: Endoscopy;  Laterality: N/A;   EXCHANGE OF A DIALYSIS CATHETER Left 07/31/2017   Procedure: Removal  OF A  Right GroinTUNNELED  DIALYSIS CATHETER ,  Insertion of Left Femoral Dialysis Catheter.;  Surgeon: Oris Krystal FALCON, MD;  Location: [facility] OR;  Service: Vascular;  Laterality: Left;   HEMOSTASIS CLIP PLACEMENT  02/07/2021   Procedure: HEMOSTASIS CLIP PLACEMENT;  Surgeon: Teressa Toribio SQUIBB, MD;  Location: [facility] ENDOSCOPY;  Service: Endoscopy;;   HEMOSTASIS CLIP PLACEMENT  10/22/2022   Procedure: HEMOSTASIS CLIP PLACEMENT;  Surgeon: Legrand Victory LITTIE DOUGLAS, MD;  Location: [facility] ENDOSCOPY;  Service: Gastroenterology;;   HEMOSTASIS CLIP PLACEMENT  03/13/2023   Procedure: HEMOSTASIS CLIP PLACEMENT;  Surgeon: San Sandor GAILS, DO;  Location: [facility] ENDOSCOPY;  Service: Gastroenterology;;   HOT HEMOSTASIS N/A 09/06/2019   Procedure: HOT HEMOSTASIS (ARGON PLASMA COAGULATION/BICAP);  Surgeon: Avram Lupita BRAVO, MD;  Location: [facility] ENDOSCOPY;  Service: Endoscopy;  Laterality: N/A;   HOT HEMOSTASIS N/A 02/07/2021   Procedure: HOT HEMOSTASIS (ARGON PLASMA COAGULATION/BICAP);  Surgeon: Teressa Toribio SQUIBB, MD;  Location: [facility] ENDOSCOPY;  Service: Endoscopy;  Laterality: N/A;   HOT HEMOSTASIS N/A 10/22/2022   Procedure: HOT HEMOSTASIS (ARGON PLASMA COAGULATION/BICAP);  Surgeon: Legrand Victory LITTIE DOUGLAS, MD;   Location: [facility] ENDOSCOPY;  Service: Gastroenterology;  Laterality: N/A;   INSERTION OF DIALYSIS CATHETER Right 06/16/2017   Procedure: INSERTION OF DIALYSIS CATHETER;  Surgeon: Laurence Redell LITTIE, MD;  Location: [facility] OR;  Service: Vascular;  Laterality: Right;   IR AV DIALY SHUNT INTRO NEEDLE/INTRACATH INITIAL W/PTA/IMG LEFT  06/21/2018   POLYPECTOMY  03/13/2023   Procedure: POLYPECTOMY;  Surgeon: San Sandor GAILS, DO;  Location: [facility] ENDOSCOPY;  Service: Gastroenterology;;   REPAIR OF PERFORATED ULCER  1990   gastric ulcer   SCLEROTHERAPY  10/22/2022   Procedure: SCLEROTHERAPY;  Surgeon: Legrand Victory LITTIE DOUGLAS, MD;  Location: [facility] ENDOSCOPY;  Service: Gastroenterology;;   SUBMUCOSAL TATTOO INJECTION  10/22/2022   Procedure: SUBMUCOSAL TATTOO INJECTION;  Surgeon: Legrand Victory LITTIE DOUGLAS, MD;  Location: [facility] ENDOSCOPY;  Service: Gastroenterology;;     reports that she has been smoking cigarettes. She has a 10 pack-year smoking history. She has been exposed to tobacco smoke. She has never used smokeless tobacco. She reports that she does not currently use drugs after having used the following drugs: Heroin, Marijuana, and Cocaine. She reports that she does not drink alcohol.  Allergies  Allergen Reactions   Aspirin  Nausea And Vomiting    Stomach ache   Ibuprofen Nausea And Vomiting    Stomach ache    Family History  Problem Relation Age of Onset  Hypertension Father    Cancer Father    Hyperlipidemia Father    Seizures Sister    Early death Daughter    Kidney disease Daughter        end stage dialysis dependent    Breast cancer Maternal Aunt     Prior to Admission medications   Medication Sig Start Date End Date Taking? Authorizing Provider  amLODipine  (NORVASC ) 10 MG tablet Take 1 tablet (10 mg total) by mouth daily. Patient taking differently: Take 10 mg by mouth at bedtime. 11/11/16  Yes Phelps, Jazma Y, DO  CREON  12000-38000 units CPEP capsule Take 4 capsules by mouth 2 (two) times daily with a meal.  03/02/23  Yes [provider]  gabapentin  (NEURONTIN ) 100 MG capsule Take 200 mg by mouth 4 (four) times a week.   Yes [provider]  hydrALAZINE  (APRESOLINE ) 25 MG tablet Take 3 tablets (75 mg total) by mouth every 8 (eight) hours. 11/14/20 03/09/24 Yes Austria, Eric J, DO  pantoprazole  (PROTONIX ) 40 MG tablet Take 1 tablet (40 mg total) by mouth daily. 03/05/23 10/07/23 Yes Barrett, Jamie N, PA-C  sevelamer  carbonate (RENVELA ) 800 MG tablet Take 800 mg by mouth 3 (three) times daily with meals. 07/23/20  Yes [provider]    Physical Exam: Constitutional: Moderately built and nourished. Vitals:   06/17/23 2230 06/17/23 2300 06/17/23 2300 06/17/23 2330  BP: (!) 158/66 (!) 154/64  (!) 157/64  Pulse: 70 69  69  Resp:      Temp:   98.1 F (36.7 C)   TempSrc:   Oral   SpO2: 100% 100%  100%   Eyes: Anicteric no pallor. ENMT: No discharge from the ears eyes nose or mouth. Neck: No mass felt.  No neck rigidity. Respiratory: No rhonchi or crepitations. Cardiovascular: S1-S2 heard. Abdomen: Soft nontender bowel sound present. Musculoskeletal: No edema. Skin: No rash. Neurologic: Alert awake oriented to time place and person.  Moves all extremities. Psychiatric: Appears normal.  Normal affect.   Labs on Admission: I have personally reviewed following labs and imaging studies  CBC: Recent Labs  Lab 06/17/23 1540  WBC 8.0  HGB 13.1  HCT 40.7  MCV 88.7  PLT 229   Basic Metabolic Panel: Recent Labs  Lab 06/17/23 1648  NA 140  K 5.4*  CL 98  CO2 26  GLUCOSE 94  BUN 31*  CREATININE 9.12*  CALCIUM  7.3*  MG 1.8   GFR: CrCl cannot be calculated (Unknown ideal weight.). Liver Function Tests: Recent Labs  Lab 06/17/23 1648  AST 16  ALT 9  ALKPHOS 76  BILITOT 0.7  PROT 7.2  ALBUMIN  3.4*   Recent Labs  Lab 06/17/23 1648  LIPASE 73*   No results for input(s): AMMONIA in the last 168 hours. Coagulation Profile: No results for input(s):  INR, PROTIME in the last 168 hours. Cardiac Enzymes: No results for input(s): CKTOTAL, CKMB, CKMBINDEX, TROPONINI in the last 168 hours. BNP (last 3 results) No results for input(s): PROBNP in the last 8760 hours. HbA1C: No results for input(s): HGBA1C in the last 72 hours. CBG: No results for input(s): GLUCAP in the last 168 hours. Lipid Profile: No results for input(s): CHOL, HDL, LDLCALC, TRIG, CHOLHDL, LDLDIRECT in the last 72 hours. Thyroid  Function Tests: No results for input(s): TSH, T4TOTAL, FREET4, T3FREE, THYROIDAB in the last 72 hours. Anemia Panel: No results for input(s): VITAMINB12, FOLATE, FERRITIN, TIBC, IRON , RETICCTPCT in the last 72 hours. Urine analysis:    Component Value  Date/Time   COLORURINE YELLOW 12/31/2018 1014   APPEARANCEUR CLEAR 12/31/2018 1014   LABSPEC 1.008 12/31/2018 1014   PHURINE 9.0 (H) 12/31/2018 1014   GLUCOSEU NEGATIVE 12/31/2018 1014   HGBUR SMALL (A) 12/31/2018 1014   BILIRUBINUR NEGATIVE 12/31/2018 1014   BILIRUBINUR NEG 01/29/2016 1142   KETONESUR NEGATIVE 12/31/2018 1014   PROTEINUR >=300 (A) 12/31/2018 1014   UROBILINOGEN 0.2 01/29/2016 1142   UROBILINOGEN 0.2 03/21/2015 1649   NITRITE NEGATIVE 12/31/2018 1014   LEUKOCYTESUR NEGATIVE 12/31/2018 1014   Sepsis Labs: @LABRCNTIP (procalcitonin:4,lacticidven:4) )No results found for this or any previous visit (from the past 240 hours).   Radiological Exams on Admission: US  Abdomen Limited RUQ (LIVER/GB) Result Date: 06/17/2023 CLINICAL DATA:  Epigastric pain EXAM: ULTRASOUND ABDOMEN LIMITED RIGHT UPPER QUADRANT COMPARISON:  CT 06/17/2023, ultrasound 02/06/2021 FINDINGS: Gallbladder: No gallstones or wall thickening visualized. No sonographic Murphy sign noted by sonographer. Common bile duct: Diameter: 1.6 mm Liver: No focal lesion identified. Within normal limits in parenchymal echogenicity. Portal vein is patent on color Doppler imaging  with normal direction of blood flow towards the liver. Possible slight surface nodularity of the liver. Other: Small volume ascites in the right upper quadrant. IMPRESSION: 1. Negative for gallstones or biliary dilatation. 2. Possible slight surface nodularity of the liver, correlate for cirrhosis risk factors. Small volume ascites in the right upper quadrant. Electronically Signed   By: Luke Bun M.D.   On: 06/17/2023 21:32   CT ABDOMEN PELVIS WO CONTRAST Result Date: 06/17/2023 CLINICAL DATA:  Abdomen pain possible pancreatitis EXAM: CT ABDOMEN AND PELVIS WITHOUT CONTRAST TECHNIQUE: Multidetector CT imaging of the abdomen and pelvis was performed following the standard protocol without IV contrast. RADIATION DOSE REDUCTION: This exam was performed according to the departmental dose-optimization program which includes automated exposure control, adjustment of the mA and/or kV according to patient size and/or use of iterative reconstruction technique. COMPARISON:  MRI 05/27/2023, CT 05/26/2023, 03/10/2023 FINDINGS: Lower chest: Lung bases demonstrate chronic reticulation and fibrosis. No acute airspace disease. Cardiomegaly Hepatobiliary: No focal liver abnormality is seen. No gallstones, gallbladder wall thickening, or biliary dilatation. Pancreas: Mild pancreatic ductal dilatation measuring up to 5 mm again noted. No definite acute peripancreatic inflammatory change. Spleen: Normal in size without focal abnormality. Adrenals/Urinary Tract: Adrenal glands are thickened bilaterally but without dominant mass. Atrophic kidneys without hydronephrosis. The bladder is nearly empty Stomach/Bowel: The stomach is nonenlarged. Possible diffuse gastric wall thickening. There is no dilated small bowel. Linear density within the right colon, may reflect hemo static clip. Negative appendix. No acute bowel wall thickening Vascular/Lymphatic: Advanced aortic atherosclerosis. No aneurysm. No suspicious lymph node  Reproductive: Hysterectomy.  No adnexal mass Other: No free air. Generalized subcutaneous edema. Small volume free fluid adjacent to the liver. Musculoskeletal: No acute or suspicious osseous abnormality. IMPRESSION: 1. No CT evidence for acute peripancreatic inflammatory change. Mild pancreatic ductal dilatation again noted, reference MRCP 05/27/2023 2. Decompressed stomach with suspected diffuse chronic gastric wall thickening. 3. Generalized subcutaneous edema and small volume free fluid adjacent to the liver. 4. Cardiomegaly. 5. Atrophic kidneys. 6. Aortic atherosclerosis. Aortic Atherosclerosis (ICD10-I70.0). Electronically Signed   By: Luke Bun M.D.   On: 06/17/2023 21:29     Assessment/Plan Principal Problem:   Abdominal pain Active Problems:   ESRD on hemodialysis (HCC)   Acute on chronic pancreatitis (HCC)   Hypertensive urgency   Nausea vomiting and diarrhea    Abdominal pain concerning for acute on chronic pancreatitis -   Dr. Stacia gastroenterologist has  been consulted.  Will keep patient n.p.o. for now.  Takes Creon . 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  Consult nephrology for dialysis. History of GERD with CT scan showing gastric wall thickening will keep patient on IV Protonix . Hypertension on amlodipine  and hydralazine .  Since patient has persistent abdominal pain concerning for acute on chronic pancreatitis will need further workup and inpatient status.   DVT prophylaxis: Heparin . Code Status: Full code. Family Communication: Discussed with patient. Disposition Plan: Monitored bed. Consults called: Solicitor. Admission status: Inpatient.

## 2023-06-18 NOTE — ED Notes (Signed)
 ED TO INPATIENT HANDOFF REPORT  ED Nurse Name and Phone #: Delon RAMAN Name/Age/Gender Ashley [name] Fell 73 y.o. female Room/Bed: 038C/038C  Code Status   Code Status: Full Code  Home/SNF/Other home Patient oriented to: self, place, time, and situation Is this baseline? Yes   Triage Complete: Triage complete  Chief Complaint Nausea vomiting and diarrhea [R11.2, R19.7] Abdominal pain [R10.9] Abdominal pain, unspecified abdominal location [R10.9]  Triage Note PT BIB EMS from home upper abd pain x 3 days. Feels like pancreatitis. No n/v pt has had diarrhea  Vs 138/70 80 99%ra Rr 14   CBG 101   Allergies Allergies  Allergen Reactions   Aspirin  Nausea And Vomiting    Stomach ache   Ibuprofen Nausea And Vomiting    Stomach ache    Level of Care/Admitting Diagnosis ED Disposition     ED Disposition  Admit   Condition  --   Comment  Hospital Area: [facility] [[id number]]  Level of Care: Telemetry Medical [104]  May admit patient to [facility] or [facility] if equivalent level of care is available:: No  Covid Evaluation: Asymptomatic - no recent exposure (last 10 days) testing not required  Diagnosis: Abdominal pain [255246]  Admitting Physician: FRANKY REDIA SAILOR [[id number]]  Attending Physician: FRANKY REDIA SAILOR [phone]  Certification:: I certify this patient will need inpatient services for at least 2 midnights  Expected Medical Readiness: 06/20/2023          B Medical/Surgery History Past Medical History:  Diagnosis Date   Acute pancreatitis 2000   2000, 12/2018, 08/2019   Arthritis    Cervical radiculopathy 02/28/2011   Cocaine substance abuse (HCC) 05/26/2013   positive UDS    Duodenitis    Erosive gastropathy    ESRD on hemodialysis (HCC)    TTS   GERD (gastroesophageal reflux disease)    Hepatitis C 1987   dt hx IVDA.  genotype 2B.  Epclusa  started early 04/2020.     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  Chronic microvascular ischemic changes w parenchymal volume loss. Chronic white matter periventricular microhemorrhage, likely due to htn   Ulcer 1990   gastric ulcer. Ruptured s/p emergency repair   Past Surgical History:  Procedure Laterality Date   ABDOMINAL HYSTERECTOMY  1979   AV FISTULA PLACEMENT Left 06/16/2017   Procedure: ARTERIOVENOUS (AV) FISTULA CREATION LEFT ARM;  Surgeon: Laurence Redell CROME, MD;  Location: [facility] OR;  Service: Vascular;  Laterality: Left;   BASCILIC VEIN TRANSPOSITION Left 10/02/2017   Procedure: BASILIC VEIN TRANSPOSITION SECOND STAGE LEFT ARM;  Surgeon: Oris Krystal FALCON, MD;  Location: [facility] OR;  Service: Vascular;  Laterality: Left;   BIOPSY  09/06/2019   Procedure: BIOPSY;  Surgeon: Avram Lupita FORBES, MD;  Location: [facility] ENDOSCOPY;  Service: Endoscopy;;   BIOPSY  03/13/2023   Procedure: BIOPSY;  Surgeon: San Sandor GAILS, DO;  Location: [facility] ENDOSCOPY;  Service: Gastroenterology;;   COLONOSCOPY WITH PROPOFOL  N/A 03/13/2023   Procedure: COLONOSCOPY WITH PROPOFOL ;  Surgeon: San Sandor GAILS, DO;  Location: [facility] ENDOSCOPY;  Service: Gastroenterology;  Laterality: N/A;   ENTEROSCOPY N/A 10/22/2022   Procedure: ENTEROSCOPY;  Surgeon: Legrand Victory CROME DOUGLAS, MD;  Location:  [facility] ENDOSCOPY;  Service: Gastroenterology;  Laterality: N/A;   ESOPHAGOGASTRODUODENOSCOPY N/A 05/29/2013   Procedure: ESOPHAGOGASTRODUODENOSCOPY (EGD);  Surgeon: Gordy CHRISTELLA Starch, MD;  Location: [facility] ENDOSCOPY;  Service: Endoscopy;  Laterality: N/A;   ESOPHAGOGASTRODUODENOSCOPY  05/2013   for epigastric pain.  Nonobstructing Schatzki ring at GEJ, mild gastropathy, nonbleeding AVMs in bulb and D2. 5 mm sessile polyp in bulb.   ESOPHAGOGASTRODUODENOSCOPY (EGD) WITH PROPOFOL  N/A 09/06/2019    Procedure: ESOPHAGOGASTRODUODENOSCOPY (EGD) WITH PROPOFOL ;  Surgeon: Avram Lupita BRAVO, MD;  Location: [facility] ENDOSCOPY;  Service: Endoscopy;  Laterality: N/A;   ESOPHAGOGASTRODUODENOSCOPY (EGD) WITH PROPOFOL  N/A 02/02/2020   Procedure: ESOPHAGOGASTRODUODENOSCOPY (EGD) WITH PROPOFOL ;  Surgeon: Teressa Toribio SQUIBB, MD;  Location: [facility] ENDOSCOPY;  Service: Endoscopy;  Laterality: N/A;   ESOPHAGOGASTRODUODENOSCOPY (EGD) WITH PROPOFOL  N/A 02/07/2021   Procedure: ESOPHAGOGASTRODUODENOSCOPY (EGD) WITH PROPOFOL ;  Surgeon: Teressa Toribio SQUIBB, MD;  Location: [facility] ENDOSCOPY;  Service: Endoscopy;  Laterality: N/A;   ESOPHAGOGASTRODUODENOSCOPY (EGD) WITH PROPOFOL  N/A 03/13/2023   Procedure: ESOPHAGOGASTRODUODENOSCOPY (EGD) WITH PROPOFOL ;  Surgeon: San Sandor GAILS, DO;  Location: [facility] ENDOSCOPY;  Service: Gastroenterology;  Laterality: N/A;   EUS N/A 02/02/2020   Procedure: UPPER ENDOSCOPIC ULTRASOUND (EUS) RADIAL;  Surgeon: Teressa Toribio SQUIBB, MD;  Location: [facility] ENDOSCOPY;  Service: Endoscopy;  Laterality: N/A;   EXCHANGE OF A DIALYSIS CATHETER Left 07/31/2017   Procedure: Removal  OF A  Right GroinTUNNELED  DIALYSIS CATHETER ,  Insertion of Left Femoral Dialysis Catheter.;  Surgeon: Oris Krystal FALCON, MD;  Location: [facility] OR;  Service: Vascular;  Laterality: Left;   HEMOSTASIS CLIP PLACEMENT  02/07/2021   Procedure: HEMOSTASIS CLIP PLACEMENT;  Surgeon: Teressa Toribio SQUIBB, MD;  Location: [facility] ENDOSCOPY;  Service: Endoscopy;;   HEMOSTASIS CLIP PLACEMENT  10/22/2022   Procedure: HEMOSTASIS CLIP PLACEMENT;  Surgeon: Legrand Victory LITTIE DOUGLAS, MD;  Location: [facility] ENDOSCOPY;  Service: Gastroenterology;;   HEMOSTASIS CLIP PLACEMENT  03/13/2023   Procedure: HEMOSTASIS CLIP PLACEMENT;  Surgeon: San Sandor GAILS, DO;  Location: [facility] ENDOSCOPY;  Service: Gastroenterology;;   HOT HEMOSTASIS N/A 09/06/2019   Procedure: HOT HEMOSTASIS (ARGON PLASMA COAGULATION/BICAP);  Surgeon: Avram Lupita BRAVO, MD;  Location: [facility] ENDOSCOPY;  Service: Endoscopy;  Laterality: N/A;   HOT  HEMOSTASIS N/A 02/07/2021   Procedure: HOT HEMOSTASIS (ARGON PLASMA COAGULATION/BICAP);  Surgeon: Teressa Toribio SQUIBB, MD;  Location: [facility] ENDOSCOPY;  Service: Endoscopy;  Laterality: N/A;   HOT HEMOSTASIS N/A 10/22/2022   Procedure: HOT HEMOSTASIS (ARGON PLASMA COAGULATION/BICAP);  Surgeon: Legrand Victory LITTIE DOUGLAS, MD;  Location: [facility] ENDOSCOPY;  Service: Gastroenterology;  Laterality: N/A;   INSERTION OF DIALYSIS CATHETER Right 06/16/2017   Procedure: INSERTION OF DIALYSIS CATHETER;  Surgeon: Laurence Redell LITTIE, MD;  Location: [facility] OR;  Service: Vascular;  Laterality: Right;   IR AV DIALY SHUNT INTRO NEEDLE/INTRACATH INITIAL W/PTA/IMG LEFT  06/21/2018   POLYPECTOMY  03/13/2023   Procedure: POLYPECTOMY;  Surgeon: San Sandor GAILS, DO;  Location: [facility] ENDOSCOPY;  Service: Gastroenterology;;   REPAIR OF PERFORATED ULCER  1990   gastric ulcer   SCLEROTHERAPY  10/22/2022   Procedure: SCLEROTHERAPY;  Surgeon: Legrand Victory LITTIE DOUGLAS, MD;  Location: [facility] ENDOSCOPY;  Service: Gastroenterology;;   SUBMUCOSAL TATTOO INJECTION  10/22/2022   Procedure: SUBMUCOSAL TATTOO INJECTION;  Surgeon: Legrand Victory LITTIE DOUGLAS, MD;  Location: [facility] ENDOSCOPY;  Service: Gastroenterology;;     A IV Location/Drains/Wounds Patient Lines/Drains/Airways Status     Active Line/Drains/Airways     Name Placement date Placement time Site Days   Peripheral IV 06/17/23 20 G Posterior;Right Hand 06/17/23  2128  Hand  1   Fistula / Graft Left Upper arm Arteriovenous fistula --  --  Upper arm  --            Intake/Output Last 24 hours  Intake/Output Summary (Last 24 hours) at 06/18/2023 1915 Last data filed at 06/18/2023 1801 Gross per 24 hour  Intake --  Output 2700 ml  Net -2700 ml    Labs/Imaging Results for orders placed or performed during the hospital encounter of 06/17/23 (from the past 48 hours)  CBC     Status: Abnormal   Collection Time: 06/17/23  3:40 PM  Result Value Ref Range   WBC 8.0 4.0 - 10.5 K/uL   RBC 4.59 3.87 - 5.11 MIL/uL    Hemoglobin 13.1 12.0 - 15.0 g/dL   HCT 59.2 63.9 - 53.9 %   MCV 88.7 80.0 - 100.0 fL   MCH 28.5 26.0 - 34.0 pg   MCHC 32.2 30.0 - 36.0 g/dL   RDW 77.4 (H) 88.4 - 84.4 %   Platelets 229 150 - 400 K/uL   nRBC 0.0 0.0 - 0.2 %    Comment: Performed at [facility], [address] [place], [place] [address]  Comprehensive metabolic panel     Status: Abnormal   Collection Time: 06/17/23  4:48 PM  Result Value Ref Range   Sodium 140 135 - 145 mmol/L   Potassium 5.4 (H) 3.5 - 5.1 mmol/L   Chloride 98 98 - 111 mmol/L   CO2 26 22 - 32 mmol/L   Glucose, Bld 94 70 - 99 mg/dL    Comment: Glucose reference range applies only to samples taken after fasting for at least 8 hours.   BUN 31 (H) 8 - 23 mg/dL   Creatinine, Ser 0.87 (H) 0.44 - 1.00 mg/dL   Calcium  7.3 (L) 8.9 - 10.3 mg/dL   Total Protein 7.2 6.5 - 8.1 g/dL   Albumin  3.4 (L) 3.5 - 5.0 g/dL   AST 16 15 - 41 U/L   ALT 9 0 - 44 U/L   Alkaline Phosphatase 76 38 - 126 U/L   Total Bilirubin 0.7 0.0 - 1.2 mg/dL   GFR, Estimated 4 (L) >60 mL/min    Comment: (NOTE) Calculated using the CKD-EPI Creatinine Equation (2021)    Anion gap 16 (H) 5 - 15    Comment: Performed at [facility], [address], [place], [place] [address]  Lipase, blood     Status: Abnormal   Collection Time: 06/17/23  4:48 PM  Result Value Ref Range   Lipase 73 (H) 11 - 51 U/L    Comment: Performed at [facility], [address] [place], [place] [address]  Magnesium     Status: None   Collection Time: 06/17/23  4:48 PM  Result Value Ref Range   Magnesium 1.8 1.7 - 2.4 mg/dL    Comment: Performed at [facility], [address], [place], [place] [address]  Comprehensive metabolic panel     Status: Abnormal   Collection Time: 06/18/23  3:21 AM  Result Value Ref Range   Sodium 138 135 - 145 mmol/L   Potassium 5.9 (H) 3.5 - 5.1 mmol/L   Chloride 101 98 - 111 mmol/L   CO2 24 22 - 32 mmol/L   Glucose, Bld 85 70 - 99 mg/dL    Comment:  Glucose reference range applies only to samples taken after fasting for at least 8 hours.  BUN 32 (H) 8 - 23 mg/dL   Creatinine, Ser 89.93 (H) 0.44 - 1.00 mg/dL   Calcium  7.3 (L) 8.9 - 10.3 mg/dL   Total Protein 6.4 (L) 6.5 - 8.1 g/dL   Albumin  3.1 (L) 3.5 - 5.0 g/dL   AST 15 15 - 41 U/L   ALT 9 0 - 44 U/L   Alkaline Phosphatase 67 38 - 126 U/L   Total Bilirubin 0.6 0.0 - 1.2 mg/dL   GFR, Estimated 4 (L) >60 mL/min    Comment: (NOTE) Calculated using the CKD-EPI Creatinine Equation (2021)    Anion gap 13 5 - 15    Comment: Performed at [facility], [address], [place], [place] [address]  CBC     Status: Abnormal   Collection Time: 06/18/23  3:21 AM  Result Value Ref Range   WBC 7.3 4.0 - 10.5 K/uL   RBC 3.88 3.87 - 5.11 MIL/uL   Hemoglobin 10.7 (L) 12.0 - 15.0 g/dL   HCT 65.5 (L) 63.9 - 53.9 %   MCV 88.7 80.0 - 100.0 fL   MCH 27.6 26.0 - 34.0 pg   MCHC 31.1 30.0 - 36.0 g/dL   RDW 78.2 (H) 88.4 - 84.4 %   Platelets 202 150 - 400 K/uL   nRBC 0.0 0.0 - 0.2 %    Comment: Performed at [facility], [address] [place], [place] [address]  CBG monitoring, ED     Status: None   Collection Time: 06/18/23  6:18 AM  Result Value Ref Range   Glucose-Capillary 79 70 - 99 mg/dL    Comment: Glucose reference range applies only to samples taken after fasting for at least 8 hours.  Renal function panel     Status: Abnormal   Collection Time: 06/18/23  7:40 AM  Result Value Ref Range   Sodium 140 135 - 145 mmol/L   Potassium 6.3 (HH) 3.5 - 5.1 mmol/L    Comment: CRITICAL RESULT CALLED TO, READ BACK BY AND VERIFIED WITH MONTEE,B RN @ [phone] 06/18/23 LEONARD,A   Chloride 100 98 - 111 mmol/L   CO2 24 22 - 32 mmol/L   Glucose, Bld 77 70 - 99 mg/dL    Comment: Glucose reference range applies only to samples taken after fasting for at least 8 hours.   BUN 38 (H) 8 - 23 mg/dL   Creatinine, Ser 89.98 (H) 0.44 - 1.00 mg/dL   Calcium  7.5 (L) 8.9 - 10.3 mg/dL   Phosphorus 6.0 (H)  2.5 - 4.6 mg/dL   Albumin  3.1 (L) 3.5 - 5.0 g/dL   GFR, Estimated 4 (L) >60 mL/min    Comment: (NOTE) Calculated using the CKD-EPI Creatinine Equation (2021)    Anion gap 16 (H) 5 - 15    Comment: Performed at [facility], [address], [place], [place] [address]  CBC     Status: Abnormal   Collection Time: 06/18/23  7:40 AM  Result Value Ref Range   WBC 6.8 4.0 - 10.5 K/uL   RBC 3.90 3.87 - 5.11 MIL/uL   Hemoglobin 10.9 (L) 12.0 - 15.0 g/dL   HCT 64.6 (L) 63.9 - 53.9 %   MCV 90.5 80.0 - 100.0 fL   MCH 27.9 26.0 - 34.0 pg   MCHC 30.9 30.0 - 36.0 g/dL   RDW 78.4 (H) 88.4 - 84.4 %   Platelets 223 150 - 400 K/uL   nRBC 0.0 0.0 - 0.2 %  Comment: Performed at [facility], [address], [place], [place] [address]   DG CHEST PORT 1 VIEW Result Date: 06/18/2023 CLINICAL DATA:  Hypoxia.  Upper abdominal pain. EXAM: PORTABLE CHEST 1 VIEW COMPARISON:  05/21/2023. FINDINGS: Mild pulmonary vascular congestion, similar to the prior study. Bilateral lung fields are otherwise clear. No acute consolidation or lung collapse. Bilateral costophrenic angles are clear. Stable cardio-mediastinal silhouette. No acute osseous abnormalities. The soft tissues are within normal limits. Left proximal arm vascular stent again seen. IMPRESSION: *Mild pulmonary vascular congestion, similar to the prior study. Electronically Signed   By: Ree Molt M.D.   On: 06/18/2023 13:01   US  Abdomen Limited RUQ (LIVER/GB) Result Date: 06/17/2023 CLINICAL DATA:  Epigastric pain EXAM: ULTRASOUND ABDOMEN LIMITED RIGHT UPPER QUADRANT COMPARISON:  CT 06/17/2023, ultrasound 02/06/2021 FINDINGS: Gallbladder: No gallstones or wall thickening visualized. No sonographic Murphy sign noted by sonographer. Common bile duct: Diameter: 1.6 mm Liver: No focal lesion identified. Within normal limits in parenchymal echogenicity. Portal vein is patent on color Doppler imaging with normal direction of blood flow towards the liver.  Possible slight surface nodularity of the liver. Other: Small volume ascites in the right upper quadrant. IMPRESSION: 1. Negative for gallstones or biliary dilatation. 2. Possible slight surface nodularity of the liver, correlate for cirrhosis risk factors. Small volume ascites in the right upper quadrant. Electronically Signed   By: Luke Bun M.D.   On: 06/17/2023 21:32   CT ABDOMEN PELVIS WO CONTRAST Result Date: 06/17/2023 CLINICAL DATA:  Abdomen pain possible pancreatitis EXAM: CT ABDOMEN AND PELVIS WITHOUT CONTRAST TECHNIQUE: Multidetector CT imaging of the abdomen and pelvis was performed following the standard protocol without IV contrast. RADIATION DOSE REDUCTION: This exam was performed according to the departmental dose-optimization program which includes automated exposure control, adjustment of the mA and/or kV according to patient size and/or use of iterative reconstruction technique. COMPARISON:  MRI 05/27/2023, CT 05/26/2023, 03/10/2023 FINDINGS: Lower chest: Lung bases demonstrate chronic reticulation and fibrosis. No acute airspace disease. Cardiomegaly Hepatobiliary: No focal liver abnormality is seen. No gallstones, gallbladder wall thickening, or biliary dilatation. Pancreas: Mild pancreatic ductal dilatation measuring up to 5 mm again noted. No definite acute peripancreatic inflammatory change. Spleen: Normal in size without focal abnormality. Adrenals/Urinary Tract: Adrenal glands are thickened bilaterally but without dominant mass. Atrophic kidneys without hydronephrosis. The bladder is nearly empty Stomach/Bowel: The stomach is nonenlarged. Possible diffuse gastric wall thickening. There is no dilated small bowel. Linear density within the right colon, may reflect hemo static clip. Negative appendix. No acute bowel wall thickening Vascular/Lymphatic: Advanced aortic atherosclerosis. No aneurysm. No suspicious lymph node Reproductive: Hysterectomy.  No adnexal mass Other: No free air.  Generalized subcutaneous edema. Small volume free fluid adjacent to the liver. Musculoskeletal: No acute or suspicious osseous abnormality. IMPRESSION: 1. No CT evidence for acute peripancreatic inflammatory change. Mild pancreatic ductal dilatation again noted, reference MRCP 05/27/2023 2. Decompressed stomach with suspected diffuse chronic gastric wall thickening. 3. Generalized subcutaneous edema and small volume free fluid adjacent to the liver. 4. Cardiomegaly. 5. Atrophic kidneys. 6. Aortic atherosclerosis. Aortic Atherosclerosis (ICD10-I70.0). Electronically Signed   By: Luke Bun M.D.   On: 06/17/2023 21:29    Pending Labs Unresulted Labs (From admission, onward)     Start     Ordered   06/18/23 1226  Hepatitis B surface antigen  (New Admission Hemo Labs (Hepatitis B))  Once,   R        06/18/23 1227   06/18/23  1226  Hepatitis B surface antibody,quantitative  (New Admission Hemo Labs (Hepatitis B))  Once,   R        06/18/23 1227   06/18/23 0605  Gastrointestinal Panel by PCR , Stool  (Gastrointestinal Panel by PCR, Stool                                                                                                                                                     **Does Not include CLOSTRIDIUM DIFFICILE testing. **If CDIFF testing is needed, place order from the C Difficile Testing order set.**)  Once,   R        06/18/23 0604   06/17/23 1521  Urinalysis, Routine w reflex microscopic -Urine, Clean Catch  Once,   URGENT       Question:  Specimen Source  Answer:  Urine, Clean Catch   06/17/23 1522            Vitals/Pain Today's Vitals   06/18/23 1707 06/18/23 1737 06/18/23 1753 06/18/23 1801  BP: (!) 171/80 (!) 178/81 (!) 177/82 (!) 164/69  Pulse: 88 92 91 92  Resp: 12 17 14  (!) 21  Temp:    99.1 F (37.3 C)  TempSrc:      SpO2: 99% 100% 100% 99%  Weight:      PainSc:        Isolation Precautions Enteric precautions (UV disinfection)  Medications Medications   amLODipine  (NORVASC ) tablet 10 mg (10 mg Oral Given 06/18/23 0958)  hydrALAZINE  (APRESOLINE ) tablet 75 mg (75 mg Oral Given 06/18/23 0744)  lipase/protease/amylase (CREON ) capsule 48,000 Units (0 Units Oral Hold 06/18/23 0810)  sevelamer  carbonate (RENVELA ) tablet 800 mg (800 mg Oral Given 06/18/23 1136)  fentaNYL  (SUBLIMAZE ) injection 12.5 mcg (12.5 mcg Intravenous Given 06/18/23 1515)  pantoprazole  (PROTONIX ) injection 40 mg (40 mg Intravenous Given 06/18/23 0736)  sodium zirconium cyclosilicate  (LOKELMA ) packet 10 g (10 g Oral Given 06/18/23 0957)  Chlorhexidine  Gluconate Cloth 2 % PADS 6 each (has no administration in time range)  doxercalciferol  (HECTOROL ) injection 6 mcg (6 mcg Intravenous Given 06/18/23 1304)  doxercalciferol  (HECTOROL ) injection 6 mcg (has no administration in time range)  morphine  (PF) 4 MG/ML injection 4 mg (4 mg Intravenous Given 06/17/23 1553)  ondansetron  (ZOFRAN ) injection 4 mg (4 mg Intravenous Given 06/17/23 1553)  sodium chloride  0.9 % bolus 1,000 mL (0 mLs Intravenous Stopped 06/17/23 1744)  morphine  (PF) 4 MG/ML injection 4 mg (4 mg Intravenous Given 06/17/23 2134)  ondansetron  (ZOFRAN ) injection 4 mg (4 mg Intravenous Given 06/17/23 2133)    Mobility walks     Focused Assessments     R Recommendations: See Admitting Provider Note  Report given to:   Additional Notes:

## 2023-06-18 NOTE — Progress Notes (Signed)
 NEW ADMISSION NOTE New Admission Note:    Arrival Method: ED stretcher Mental Orientation: AAOx4 Telemetry: [phone] Assessment: Completed Skin: See flowsheet IV: LFA Pain: 8/10 Tubes: n/a Safety Measures: Safety Fall Prevention Plan has been given, discussed and signed Admission: Completed [facility] Orientation: Patient has been orientated to the room, unit and staff.  Family: none at bedside   Orders have been reviewed and implemented. Will continue to monitor the patient. Call light has been placed within reach and bed alarm has been activated.

## 2023-06-18 NOTE — Progress Notes (Signed)
 Received patient in bed to unit.  Alert and oriented.  Informed consent signed and in chart.   TX duration: 3 hours  Patient tolerated well.  Transported back to the room  Alert, without acute distress.  Hand-off given to patient's nurse Delon Side  Access used: Left UE AVF Access issues: none  Total UF removed: 2700 ml Medication(s) given: Fentanyl  see MAR Post HD VS: BP 164/69 P 92 resp 21 3 L/[place] o2 Sat 99% Temp 99.1 Post HD weight: unable to obtain  Harper Vandervoort, RN Kidney Dialysis Unit

## 2023-06-18 NOTE — ED Notes (Signed)
 ED TO INPATIENT HANDOFF REPORT  ED Nurse Name and Phone #: Lorenza [phone]  S Name/Age/Gender Ashley [name] Fell 73 y.o. female Room/Bed: 006C/006C  Code Status   Code Status: Full Code  Home/SNF/Other Home Patient oriented to: self,place,time,situation Is this baseline? Yes   Triage Complete: Triage complete  Chief Complaint Nausea vomiting and diarrhea [R11.2, R19.7] Abdominal pain [R10.9]  Triage Note PT BIB EMS from home upper abd pain x 3 days. Feels like pancreatitis. No n/v pt has had diarrhea  Vs 138/70 80 99%ra Rr 14   CBG 101   Allergies Allergies  Allergen Reactions   Aspirin  Nausea And Vomiting    Stomach ache   Ibuprofen Nausea And Vomiting    Stomach ache    Level of Care/Admitting Diagnosis ED Disposition     ED Disposition  Admit   Condition  --   Comment  Hospital Area: [facility] [[id number]]  Level of Care: Telemetry Medical [[id number]]  May admit patient to [facility] or [facility] if equivalent level of care is available:: No  Covid Evaluation: Asymptomatic - no recent exposure (last 10 days) testing not required  Diagnosis: Abdominal pain [255246]  Admitting Physician: FRANKY REDIA SAILOR [[id number]]  Attending Physician: FRANKY REDIA SAILOR [phone]  Certification:: I certify this patient will need inpatient services for at least 2 midnights  Expected Medical Readiness: 06/20/2023          B Medical/Surgery History Past Medical History:  Diagnosis Date   Acute pancreatitis 2000   2000, 12/2018, 08/2019   Arthritis    Cervical radiculopathy 02/28/2011   Cocaine substance abuse (HCC) 05/26/2013   positive UDS    Duodenitis    Erosive gastropathy    ESRD on hemodialysis (HCC)    TTS   GERD (gastroesophageal reflux disease)    Hepatitis C 1987   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  genotype 2B.  Epclusa  started early 04/2020.     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  Chronic microvascular ischemic changes w parenchymal volume loss. Chronic white matter periventricular microhemorrhage, likely due to htn   Ulcer 1990   gastric ulcer. Ruptured s/p emergency repair   Past Surgical History:  Procedure Laterality Date   ABDOMINAL HYSTERECTOMY  1979   AV FISTULA PLACEMENT Left 06/16/2017   Procedure: ARTERIOVENOUS (AV) FISTULA CREATION LEFT ARM;  Surgeon: Laurence Redell CROME, MD;  Location: [facility] OR;  Service: Vascular;  Laterality: Left;   BASCILIC VEIN TRANSPOSITION Left 10/02/2017   Procedure: BASILIC VEIN TRANSPOSITION SECOND STAGE LEFT ARM;  Surgeon: Oris Krystal FALCON, MD;  Location: [facility] OR;  Service: Vascular;  Laterality: Left;   BIOPSY  09/06/2019   Procedure: BIOPSY;  Surgeon: Avram Lupita FORBES, MD;  Location: [facility] ENDOSCOPY;  Service: Endoscopy;;   BIOPSY  03/13/2023   Procedure: BIOPSY;  Surgeon: San Sandor GAILS, DO;  Location: [facility] ENDOSCOPY;  Service: Gastroenterology;;   COLONOSCOPY WITH PROPOFOL  N/A 03/13/2023   Procedure: COLONOSCOPY WITH PROPOFOL ;  Surgeon: San Sandor GAILS, DO;  Location: [facility] ENDOSCOPY;  Service: Gastroenterology;  Laterality: N/A;   ENTEROSCOPY N/A 10/22/2022   Procedure: ENTEROSCOPY;  Surgeon: Legrand Victory CROME DOUGLAS, MD;  Location: [facility] ENDOSCOPY;  Service: Gastroenterology;  Laterality: N/A;  ESOPHAGOGASTRODUODENOSCOPY N/A 05/29/2013   Procedure: ESOPHAGOGASTRODUODENOSCOPY (EGD);  Surgeon: Gordy CHRISTELLA Starch, MD;  Location: [facility] ENDOSCOPY;  Service: Endoscopy;  Laterality: N/A;   ESOPHAGOGASTRODUODENOSCOPY  05/2013   for epigastric pain.  Nonobstructing Schatzki ring at GEJ, mild gastropathy, nonbleeding AVMs in bulb and D2. 5 mm sessile polyp in bulb.   ESOPHAGOGASTRODUODENOSCOPY (EGD) WITH PROPOFOL  N/A 09/06/2019   Procedure: ESOPHAGOGASTRODUODENOSCOPY (EGD) WITH PROPOFOL ;   Surgeon: Avram Lupita BRAVO, MD;  Location: [facility] ENDOSCOPY;  Service: Endoscopy;  Laterality: N/A;   ESOPHAGOGASTRODUODENOSCOPY (EGD) WITH PROPOFOL  N/A 02/02/2020   Procedure: ESOPHAGOGASTRODUODENOSCOPY (EGD) WITH PROPOFOL ;  Surgeon: Teressa Toribio SQUIBB, MD;  Location: [facility] ENDOSCOPY;  Service: Endoscopy;  Laterality: N/A;   ESOPHAGOGASTRODUODENOSCOPY (EGD) WITH PROPOFOL  N/A 02/07/2021   Procedure: ESOPHAGOGASTRODUODENOSCOPY (EGD) WITH PROPOFOL ;  Surgeon: Teressa Toribio SQUIBB, MD;  Location: [facility] ENDOSCOPY;  Service: Endoscopy;  Laterality: N/A;   ESOPHAGOGASTRODUODENOSCOPY (EGD) WITH PROPOFOL  N/A 03/13/2023   Procedure: ESOPHAGOGASTRODUODENOSCOPY (EGD) WITH PROPOFOL ;  Surgeon: San Sandor GAILS, DO;  Location: [facility] ENDOSCOPY;  Service: Gastroenterology;  Laterality: N/A;   EUS N/A 02/02/2020   Procedure: UPPER ENDOSCOPIC ULTRASOUND (EUS) RADIAL;  Surgeon: Teressa Toribio SQUIBB, MD;  Location: [facility] ENDOSCOPY;  Service: Endoscopy;  Laterality: N/A;   EXCHANGE OF A DIALYSIS CATHETER Left 07/31/2017   Procedure: Removal  OF A  Right GroinTUNNELED  DIALYSIS CATHETER ,  Insertion of Left Femoral Dialysis Catheter.;  Surgeon: Oris Krystal FALCON, MD;  Location: [facility] OR;  Service: Vascular;  Laterality: Left;   HEMOSTASIS CLIP PLACEMENT  02/07/2021   Procedure: HEMOSTASIS CLIP PLACEMENT;  Surgeon: Teressa Toribio SQUIBB, MD;  Location: [facility] ENDOSCOPY;  Service: Endoscopy;;   HEMOSTASIS CLIP PLACEMENT  10/22/2022   Procedure: HEMOSTASIS CLIP PLACEMENT;  Surgeon: Legrand Victory LITTIE DOUGLAS, MD;  Location: [facility] ENDOSCOPY;  Service: Gastroenterology;;   HEMOSTASIS CLIP PLACEMENT  03/13/2023   Procedure: HEMOSTASIS CLIP PLACEMENT;  Surgeon: San Sandor GAILS, DO;  Location: [facility] ENDOSCOPY;  Service: Gastroenterology;;   HOT HEMOSTASIS N/A 09/06/2019   Procedure: HOT HEMOSTASIS (ARGON PLASMA COAGULATION/BICAP);  Surgeon: Avram Lupita BRAVO, MD;  Location: [facility] ENDOSCOPY;  Service: Endoscopy;  Laterality: N/A;   HOT HEMOSTASIS N/A 02/07/2021   Procedure: HOT HEMOSTASIS (ARGON  PLASMA COAGULATION/BICAP);  Surgeon: Teressa Toribio SQUIBB, MD;  Location: [facility] ENDOSCOPY;  Service: Endoscopy;  Laterality: N/A;   HOT HEMOSTASIS N/A 10/22/2022   Procedure: HOT HEMOSTASIS (ARGON PLASMA COAGULATION/BICAP);  Surgeon: Legrand Victory LITTIE DOUGLAS, MD;  Location: [facility] ENDOSCOPY;  Service: Gastroenterology;  Laterality: N/A;   INSERTION OF DIALYSIS CATHETER Right 06/16/2017   Procedure: INSERTION OF DIALYSIS CATHETER;  Surgeon: Laurence Redell LITTIE, MD;  Location: [facility] OR;  Service: Vascular;  Laterality: Right;   IR AV DIALY SHUNT INTRO NEEDLE/INTRACATH INITIAL W/PTA/IMG LEFT  06/21/2018   POLYPECTOMY  03/13/2023   Procedure: POLYPECTOMY;  Surgeon: San Sandor GAILS, DO;  Location: [facility] ENDOSCOPY;  Service: Gastroenterology;;   REPAIR OF PERFORATED ULCER  1990   gastric ulcer   SCLEROTHERAPY  10/22/2022   Procedure: SCLEROTHERAPY;  Surgeon: Legrand Victory LITTIE DOUGLAS, MD;  Location: [facility] ENDOSCOPY;  Service: Gastroenterology;;   SUBMUCOSAL TATTOO INJECTION  10/22/2022   Procedure: SUBMUCOSAL TATTOO INJECTION;  Surgeon: Legrand Victory LITTIE DOUGLAS, MD;  Location: [facility] ENDOSCOPY;  Service: Gastroenterology;;     A IV Location/Drains/Wounds Patient Lines/Drains/Airways Status     Active Line/Drains/Airways     Name Placement date Placement time Site Days   Peripheral IV 06/17/23 20 G Posterior;Right Hand 06/17/23  2128  Hand  1   Fistula /  Graft Left Upper arm Arteriovenous fistula --  --  Upper arm  --            Intake/Output Last 24 hours No intake or output data in the 24 hours ending 06/18/23 1300  Labs/Imaging Results for orders placed or performed during the hospital encounter of 06/17/23 (from the past 48 hours)  CBC     Status: Abnormal   Collection Time: 06/17/23  3:40 PM  Result Value Ref Range   WBC 8.0 4.0 - 10.5 K/uL   RBC 4.59 3.87 - 5.11 MIL/uL   Hemoglobin 13.1 12.0 - 15.0 g/dL   HCT 59.2 63.9 - 53.9 %   MCV 88.7 80.0 - 100.0 fL   MCH 28.5 26.0 - 34.0 pg   MCHC 32.2 30.0 - 36.0 g/dL   RDW 77.4 (H)  88.4 - 15.5 %   Platelets 229 150 - 400 K/uL   nRBC 0.0 0.0 - 0.2 %    Comment: Performed at [facility] Lab, [address] [place], [place] [address]  Comprehensive metabolic panel     Status: Abnormal   Collection Time: 06/17/23  4:48 PM  Result Value Ref Range   Sodium 140 135 - 145 mmol/L   Potassium 5.4 (H) 3.5 - 5.1 mmol/L   Chloride 98 98 - 111 mmol/L   CO2 26 22 - 32 mmol/L   Glucose, Bld 94 70 - 99 mg/dL    Comment: Glucose reference range applies only to samples taken after fasting for at least 8 hours.   BUN 31 (H) 8 - 23 mg/dL   Creatinine, Ser 0.87 (H) 0.44 - 1.00 mg/dL   Calcium  7.3 (L) 8.9 - 10.3 mg/dL   Total Protein 7.2 6.5 - 8.1 g/dL   Albumin  3.4 (L) 3.5 - 5.0 g/dL   AST 16 15 - 41 U/L   ALT 9 0 - 44 U/L   Alkaline Phosphatase 76 38 - 126 U/L   Total Bilirubin 0.7 0.0 - 1.2 mg/dL   GFR, Estimated 4 (L) >60 mL/min    Comment: (NOTE) Calculated using the CKD-EPI Creatinine Equation (2021)    Anion gap 16 (H) 5 - 15    Comment: Performed at [facility] Lab, [address] [place], [place] [address]  Lipase, blood     Status: Abnormal   Collection Time: 06/17/23  4:48 PM  Result Value Ref Range   Lipase 73 (H) 11 - 51 U/L    Comment: Performed at [facility] Lab, [address] [place], [place] [address]  Magnesium     Status: None   Collection Time: 06/17/23  4:48 PM  Result Value Ref Range   Magnesium 1.8 1.7 - 2.4 mg/dL    Comment: Performed at [facility] Lab, [address], [place], [place] [address]  Comprehensive metabolic panel     Status: Abnormal   Collection Time: 06/18/23  3:21 AM  Result Value Ref Range   Sodium 138 135 - 145 mmol/L   Potassium 5.9 (H) 3.5 - 5.1 mmol/L   Chloride 101 98 - 111 mmol/L   CO2 24 22 - 32 mmol/L   Glucose, Bld 85 70 - 99 mg/dL    Comment: Glucose reference range applies only to samples taken after fasting for at least 8 hours.   BUN 32 (H) 8 - 23 mg/dL   Creatinine, Ser 89.93 (H) 0.44 - 1.00 mg/dL    Calcium  7.3 (L) 8.9 - 10.3 mg/dL  Total Protein 6.4 (L) 6.5 - 8.1 g/dL   Albumin  3.1 (L) 3.5 - 5.0 g/dL   AST 15 15 - 41 U/L   ALT 9 0 - 44 U/L   Alkaline Phosphatase 67 38 - 126 U/L   Total Bilirubin 0.6 0.0 - 1.2 mg/dL   GFR, Estimated 4 (L) >60 mL/min    Comment: (NOTE) Calculated using the CKD-EPI Creatinine Equation (2021)    Anion gap 13 5 - 15    Comment: Performed at [facility] Lab, [address] [place], [place] [address]  CBC     Status: Abnormal   Collection Time: 06/18/23  3:21 AM  Result Value Ref Range   WBC 7.3 4.0 - 10.5 K/uL   RBC 3.88 3.87 - 5.11 MIL/uL   Hemoglobin 10.7 (L) 12.0 - 15.0 g/dL   HCT 65.5 (L) 63.9 - 53.9 %   MCV 88.7 80.0 - 100.0 fL   MCH 27.6 26.0 - 34.0 pg   MCHC 31.1 30.0 - 36.0 g/dL   RDW 78.2 (H) 88.4 - 84.4 %   Platelets 202 150 - 400 K/uL   nRBC 0.0 0.0 - 0.2 %    Comment: Performed at [facility] Lab, [address] [place], [place] [address]  CBG monitoring, ED     Status: None   Collection Time: 06/18/23  6:18 AM  Result Value Ref Range   Glucose-Capillary 79 70 - 99 mg/dL    Comment: Glucose reference range applies only to samples taken after fasting for at least 8 hours.  Renal function panel     Status: Abnormal   Collection Time: 06/18/23  7:40 AM  Result Value Ref Range   Sodium 140 135 - 145 mmol/L   Potassium 6.3 (HH) 3.5 - 5.1 mmol/L    Comment: CRITICAL RESULT CALLED TO, READ BACK BY AND VERIFIED WITH MONTEE,B RN @ [phone] 06/18/23 LEONARD,A   Chloride 100 98 - 111 mmol/L   CO2 24 22 - 32 mmol/L   Glucose, Bld 77 70 - 99 mg/dL    Comment: Glucose reference range applies only to samples taken after fasting for at least 8 hours.   BUN 38 (H) 8 - 23 mg/dL   Creatinine, Ser 89.98 (H) 0.44 - 1.00 mg/dL   Calcium  7.5 (L) 8.9 - 10.3 mg/dL   Phosphorus 6.0 (H) 2.5 - 4.6 mg/dL   Albumin  3.1 (L) 3.5 - 5.0 g/dL   GFR, Estimated 4 (L) >60 mL/min    Comment: (NOTE) Calculated using the CKD-EPI Creatinine Equation (2021)     Anion gap 16 (H) 5 - 15    Comment: Performed at [facility] Lab, [address] [place], [place] [address]  CBC     Status: Abnormal   Collection Time: 06/18/23  7:40 AM  Result Value Ref Range   WBC 6.8 4.0 - 10.5 K/uL   RBC 3.90 3.87 - 5.11 MIL/uL   Hemoglobin 10.9 (L) 12.0 - 15.0 g/dL   HCT 64.6 (L) 63.9 - 53.9 %   MCV 90.5 80.0 - 100.0 fL   MCH 27.9 26.0 - 34.0 pg   MCHC 30.9 30.0 - 36.0 g/dL   RDW 78.4 (H) 88.4 - 84.4 %   Platelets 223 150 - 400 K/uL   nRBC 0.0 0.0 - 0.2 %    Comment: Performed at [facility] Lab, [address], [place], [place] [address]   US  Abdomen Limited RUQ (LIVER/GB) Result Date: 06/17/2023 CLINICAL DATA:  Epigastric pain EXAM: ULTRASOUND ABDOMEN LIMITED RIGHT UPPER QUADRANT COMPARISON:  CT 06/17/2023, ultrasound 02/06/2021 FINDINGS: Gallbladder: No gallstones or wall thickening visualized. No sonographic Murphy sign noted by sonographer. Common bile duct: Diameter: 1.6 mm Liver: No focal lesion identified. Within normal limits in parenchymal echogenicity. Portal vein is patent on color Doppler imaging with normal direction of blood flow towards the liver. Possible slight surface nodularity of the liver. Other: Small volume ascites in the right upper quadrant. IMPRESSION: 1. Negative for gallstones or biliary dilatation. 2. Possible slight surface nodularity of the liver, correlate for cirrhosis risk factors. Small volume ascites in the right upper quadrant. Electronically Signed   By: Luke Bun M.D.   On: 06/17/2023 21:32   CT ABDOMEN PELVIS WO CONTRAST Result Date: 06/17/2023 CLINICAL DATA:  Abdomen pain possible pancreatitis EXAM: CT ABDOMEN AND PELVIS WITHOUT CONTRAST TECHNIQUE: Multidetector CT imaging of the abdomen and pelvis was performed following the standard protocol without IV contrast. RADIATION DOSE REDUCTION: This exam was performed according to the departmental dose-optimization program which includes automated exposure control,  adjustment of the mA and/or kV according to patient size and/or use of iterative reconstruction technique. COMPARISON:  MRI 05/27/2023, CT 05/26/2023, 03/10/2023 FINDINGS: Lower chest: Lung bases demonstrate chronic reticulation and fibrosis. No acute airspace disease. Cardiomegaly Hepatobiliary: No focal liver abnormality is seen. No gallstones, gallbladder wall thickening, or biliary dilatation. Pancreas: Mild pancreatic ductal dilatation measuring up to 5 mm again noted. No definite acute peripancreatic inflammatory change. Spleen: Normal in size without focal abnormality. Adrenals/Urinary Tract: Adrenal glands are thickened bilaterally but without dominant mass. Atrophic kidneys without hydronephrosis. The bladder is nearly empty Stomach/Bowel: The stomach is nonenlarged. Possible diffuse gastric wall thickening. There is no dilated small bowel. Linear density within the right colon, may reflect hemo static clip. Negative appendix. No acute bowel wall thickening Vascular/Lymphatic: Advanced aortic atherosclerosis. No aneurysm. No suspicious lymph node Reproductive: Hysterectomy.  No adnexal mass Other: No free air. Generalized subcutaneous edema. Small volume free fluid adjacent to the liver. Musculoskeletal: No acute or suspicious osseous abnormality. IMPRESSION: 1. No CT evidence for acute peripancreatic inflammatory change. Mild pancreatic ductal dilatation again noted, reference MRCP 05/27/2023 2. Decompressed stomach with suspected diffuse chronic gastric wall thickening. 3. Generalized subcutaneous edema and small volume free fluid adjacent to the liver. 4. Cardiomegaly. 5. Atrophic kidneys. 6. Aortic atherosclerosis. Aortic Atherosclerosis (ICD10-I70.0). Electronically Signed   By: Luke Bun M.D.   On: 06/17/2023 21:29    Pending Labs Unresulted Labs (From admission, onward)     Start     Ordered   06/18/23 1226  Hepatitis B surface antigen  (New Admission Hemo Labs (Hepatitis B))  Once,   R         06/18/23 1227   06/18/23 1226  Hepatitis B surface antibody,quantitative  (New Admission Hemo Labs (Hepatitis B))  Once,   R        06/18/23 1227   06/18/23 0605  Gastrointestinal Panel by PCR , Stool  (Gastrointestinal Panel by PCR, Stool                                                                                                                                                     **  Does Not include CLOSTRIDIUM DIFFICILE testing. **If CDIFF testing is needed, place order from the C Difficile Testing order set.**)  Once,   R        06/18/23 0604   06/17/23 1521  Urinalysis, Routine w reflex microscopic -Urine, Clean Catch  Once,   URGENT       Question:  Specimen Source  Answer:  Urine, Clean Catch   06/17/23 1522            Vitals/Pain Today's Vitals   06/18/23 0930 06/18/23 0956 06/18/23 1000 06/18/23 1030  BP: (!) 153/66  (!) 150/70 (!) 150/67  Pulse: 65  67 64  Resp: 12  (!) 21 (!) 21  Temp:  98.3 F (36.8 C)    TempSrc:  Oral    SpO2: 100%  100% 100%  PainSc:        Isolation Precautions Enteric precautions (UV disinfection)  Medications Medications  amLODipine  (NORVASC ) tablet 10 mg (10 mg Oral Given 06/18/23 0958)  hydrALAZINE  (APRESOLINE ) tablet 75 mg (75 mg Oral Given 06/18/23 0744)  lipase/protease/amylase (CREON ) capsule 48,000 Units (0 Units Oral Hold 06/18/23 0810)  sevelamer  carbonate (RENVELA ) tablet 800 mg (800 mg Oral Given 06/18/23 1136)  fentaNYL  (SUBLIMAZE ) injection 12.5 mcg (12.5 mcg Intravenous Given 06/18/23 0809)  pantoprazole  (PROTONIX ) injection 40 mg (40 mg Intravenous Given 06/18/23 0736)  pentafluoroprop-tetrafluoroeth (GEBAUERS) aerosol 1 Application (has no administration in time range)  lidocaine  (PF) (XYLOCAINE ) 1 % injection 5 mL (has no administration in time range)  lidocaine -prilocaine  (EMLA ) cream 1 Application (has no administration in time range)  heparin  injection 1,000 Units (has no administration in time range)  anticoagulant sodium  citrate solution 5 mL (has no administration in time range)  sodium zirconium cyclosilicate  (LOKELMA ) packet 10 g (10 g Oral Given 06/18/23 0957)  Chlorhexidine  Gluconate Cloth 2 % PADS 6 each (has no administration in time range)  doxercalciferol  (HECTOROL ) injection 6 mcg (has no administration in time range)  doxercalciferol  (HECTOROL ) injection 6 mcg (has no administration in time range)  morphine  (PF) 4 MG/ML injection 4 mg (4 mg Intravenous Given 06/17/23 1553)  ondansetron  (ZOFRAN ) injection 4 mg (4 mg Intravenous Given 06/17/23 1553)  sodium chloride  0.9 % bolus 1,000 mL (0 mLs Intravenous Stopped 06/17/23 1744)  morphine  (PF) 4 MG/ML injection 4 mg (4 mg Intravenous Given 06/17/23 2134)  ondansetron  (ZOFRAN ) injection 4 mg (4 mg Intravenous Given 06/17/23 2133)    Mobility walks     Focused Assessments Cardiac Assessment Handoff:  Cardiac Rhythm: Normal sinus rhythm Lab Results  Component Value Date   CKTOTAL 31 (L) 02/10/2021   CKMB 2.3 02/17/2011   TROPONINI <0.30 05/26/2013   Lab Results  Component Value Date   DDIMER 0.80 (H) 12/15/2018   Does the Patient currently have chest pain? No    R Recommendations: See Admitting Provider Note  Report given to:   Additional Notes: PT has dialysis on mon, wed and fri.PT missed wed and her left arm is restricted

## 2023-06-18 NOTE — Progress Notes (Signed)
 HPI: Ashley [name] is a 73 y.o. 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  Patient also has finding it difficult to eat because of the pain.   ED Course: In the ER CT abdomen pelvis does not show any acute peripancreatic inflammatory changes but possible gastric wall thickening.  Ultrasound of the abdomen was showing possibility of cirrhotic changes.  Labs show lipase of 73 total bilirubin of 0.7.  WBC count of 8.  ER physician discussed with Dr. Stacia on-call gastroenterologist who is planning to see patient in consult.  Patient seen and examined in the ED, chart reviewed, vitals reviewed.  Patient says that her abdominal pain is better than yesterday.  She did not complain of any nausea or any diarrhea.  She in fact wanted to go home.  Lipase unremarkable and CT does not indicate acute or chronic pancreatitis so I do not think that is an issue.  GI has seen the patient and they are suspecting infectious etiology so they have ordered GI pathogen panel.  CT abdomen indicates some gastric wall thickening, I suspect possible gastritis, patient is on Protonix .  Also, patient missed her dialysis yesterday due to abdominal pain and came in with hyperkalemia of 5.4 which has worsened to 6.3.  Patient is asymptomatic.  I have ordered Lokelma  and I have consulted nephrology, may need immediate dialysis.  Blood pressure controlled, continue antihypertensives.  On examination  General exam: Appears calm and comfortable  Respiratory system: Clear to auscultation. Respiratory effort  normal. Cardiovascular system: S1 & S2 heard, RRR. No JVD, murmurs, rubs, gallops or clicks. No pedal edema. Gastrointestinal system: Abdomen is nondistended, soft and nontender. No organomegaly or masses felt. Normal bowel sounds heard. Central nervous system: Alert and oriented. No focal neurological deficits. Extremities: Symmetric 5 x 5 power. Skin: No rashes, lesions or ulcers.  Psychiatry: Judgement and insight appear normal. Mood & affect appropriate.   Total time spent: 31 minutes

## 2023-06-18 NOTE — ED Notes (Signed)
Report called into dialysis RN.

## 2023-06-18 NOTE — ED Notes (Signed)
 Patient upset that she could not eat. Secure chat send to MD. Waiting on a response.

## 2023-06-18 NOTE — Procedures (Signed)
 I was present at the procedure, reviewed the HD regimen and made appropriate changes.   Vinson Moselle MD  [facility] 06/18/2023, 6:08 PM

## 2023-06-18 NOTE — ED Notes (Signed)
 31M was called and notified that patient will be coming up. Verbalized understanding.

## 2023-06-18 NOTE — Consult Note (Addendum)
 Renal Service Consult Note [facility]  Ashley [name] 06/18/2023 Ashley JONETTA Fret, MD Requesting Physician: Dr. Vernon  Reason for Consult: ESRD pt presenting w/ abd pain, diarrhea HPI: The patient is a 73 y.o. year-old w/ PMH as below who presented to ED c/o abd pain and diarrhea. Similar symptoms last admission when dx'd w/ acute on chronic pancreatitis. In ED CT showed no acute pancreatic findings, possible gastric wall thickening. US  abd showed poss cirrhosis. Lipase 75, tbili 0..  WBC 8K. GI was called for consult. Pt was admitted to medical team. We are asked to see for dialysis.   Pt seen in ED room. No distress. Last HD was Monday, having transport problems thus missed HD Wed. She lives on the 2nd floor and can walk down the steps but needs somebody to carry her walker down, and her current provider is refusing to do this. She says her people are working on getting a car w/ people that will be able to bring her walker down to the car, and back up when finished w/ HD.   Main c/o is abd pain as above. Also wet cough w/ white phlegm for 3 wks, no f/c/s. No SOB. On 2 L [place] here, not on home O2.     ROS - denies CP, no joint pain, no HA, no blurry vision, no rash, no diarrhea, no nausea/ vomiting, no dysuria, no difficulty voiding   Past Medical History  Past Medical History:  Diagnosis Date   Acute pancreatitis 2000   2000, 12/2018, 08/2019   Arthritis    Cervical radiculopathy 02/28/2011   Cocaine substance abuse (HCC) 05/26/2013   positive UDS    Duodenitis    Erosive gastropathy    ESRD on hemodialysis (HCC)    TTS   GERD (gastroesophageal reflux disease)    Hepatitis C 1987   dt hx IVDA.  genotype 2B.  Epclusa  started early 04/2020.     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  Chronic microvascular ischemic changes w parenchymal volume loss. Chronic white matter periventricular microhemorrhage, likely due to htn   Ulcer 1990   gastric ulcer. Ruptured s/p emergency repair   Past Surgical History  Past Surgical History:  Procedure Laterality Date   ABDOMINAL HYSTERECTOMY  1979   AV FISTULA PLACEMENT Left 06/16/2017   Procedure: ARTERIOVENOUS (AV) FISTULA CREATION LEFT ARM;  Surgeon: Laurence Redell CROME, MD;  Location: [facility] OR;  Service: Vascular;  Laterality: Left;   BASCILIC VEIN TRANSPOSITION Left 10/02/2017   Procedure: BASILIC VEIN TRANSPOSITION SECOND STAGE LEFT ARM;  Surgeon: Oris Krystal FALCON, MD;  Location: [facility] OR;  Service: Vascular;  Laterality: Left;   BIOPSY  09/06/2019   Procedure: BIOPSY;  Surgeon: Avram Lupita FORBES, MD;  Location: [facility] ENDOSCOPY;  Service: Endoscopy;;   BIOPSY  03/13/2023   Procedure: BIOPSY;  Surgeon: San Sandor GAILS, DO;  Location: [facility] ENDOSCOPY;  Service: Gastroenterology;;   COLONOSCOPY WITH PROPOFOL  N/A 03/13/2023   Procedure: COLONOSCOPY WITH PROPOFOL ;  Surgeon: San Sandor GAILS, DO;  Location: [facility] ENDOSCOPY;  Service: Gastroenterology;  Laterality: N/A;   ENTEROSCOPY N/A 10/22/2022   Procedure: ENTEROSCOPY;  Surgeon: Legrand Victory CROME DOUGLAS, MD;  Location: [facility] ENDOSCOPY;  Service:  Gastroenterology;  Laterality: N/A;   ESOPHAGOGASTRODUODENOSCOPY N/A 05/29/2013   Procedure: ESOPHAGOGASTRODUODENOSCOPY (EGD);  Surgeon: Gordy CHRISTELLA Starch, MD;  Location: [facility] ENDOSCOPY;  Service: Endoscopy;  Laterality: N/A;   ESOPHAGOGASTRODUODENOSCOPY  05/2013   for epigastric pain.  Nonobstructing Schatzki ring at GEJ, mild gastropathy, nonbleeding AVMs in bulb and D2. 5 mm sessile polyp in bulb.   ESOPHAGOGASTRODUODENOSCOPY (EGD) WITH PROPOFOL  N/A 09/06/2019   Procedure: ESOPHAGOGASTRODUODENOSCOPY (EGD) WITH PROPOFOL ;  Surgeon: Avram Lupita BRAVO, MD;  Location: [facility] ENDOSCOPY;  Service: Endoscopy;  Laterality:  N/A;   ESOPHAGOGASTRODUODENOSCOPY (EGD) WITH PROPOFOL  N/A 02/02/2020   Procedure: ESOPHAGOGASTRODUODENOSCOPY (EGD) WITH PROPOFOL ;  Surgeon: Teressa Toribio SQUIBB, MD;  Location: [facility] ENDOSCOPY;  Service: Endoscopy;  Laterality: N/A;   ESOPHAGOGASTRODUODENOSCOPY (EGD) WITH PROPOFOL  N/A 02/07/2021   Procedure: ESOPHAGOGASTRODUODENOSCOPY (EGD) WITH PROPOFOL ;  Surgeon: Teressa Toribio SQUIBB, MD;  Location: [facility] ENDOSCOPY;  Service: Endoscopy;  Laterality: N/A;   ESOPHAGOGASTRODUODENOSCOPY (EGD) WITH PROPOFOL  N/A 03/13/2023   Procedure: ESOPHAGOGASTRODUODENOSCOPY (EGD) WITH PROPOFOL ;  Surgeon: San Sandor GAILS, DO;  Location: [facility] ENDOSCOPY;  Service: Gastroenterology;  Laterality: N/A;   EUS N/A 02/02/2020   Procedure: UPPER ENDOSCOPIC ULTRASOUND (EUS) RADIAL;  Surgeon: Teressa Toribio SQUIBB, MD;  Location: [facility] ENDOSCOPY;  Service: Endoscopy;  Laterality: N/A;   EXCHANGE OF A DIALYSIS CATHETER Left 07/31/2017   Procedure: Removal  OF A  Right GroinTUNNELED  DIALYSIS CATHETER ,  Insertion of Left Femoral Dialysis Catheter.;  Surgeon: Oris Krystal FALCON, MD;  Location: [facility] OR;  Service: Vascular;  Laterality: Left;   HEMOSTASIS CLIP PLACEMENT  02/07/2021   Procedure: HEMOSTASIS CLIP PLACEMENT;  Surgeon: Teressa Toribio SQUIBB, MD;  Location: [facility] ENDOSCOPY;  Service: Endoscopy;;   HEMOSTASIS CLIP PLACEMENT  10/22/2022   Procedure: HEMOSTASIS CLIP PLACEMENT;  Surgeon: Legrand Victory LITTIE DOUGLAS, MD;  Location: [facility] ENDOSCOPY;  Service: Gastroenterology;;   HEMOSTASIS CLIP PLACEMENT  03/13/2023   Procedure: HEMOSTASIS CLIP PLACEMENT;  Surgeon: San Sandor GAILS, DO;  Location: [facility] ENDOSCOPY;  Service: Gastroenterology;;   HOT HEMOSTASIS N/A 09/06/2019   Procedure: HOT HEMOSTASIS (ARGON PLASMA COAGULATION/BICAP);  Surgeon: Avram Lupita BRAVO, MD;  Location: [facility] ENDOSCOPY;  Service: Endoscopy;  Laterality: N/A;   HOT HEMOSTASIS N/A 02/07/2021   Procedure: HOT HEMOSTASIS (ARGON PLASMA COAGULATION/BICAP);  Surgeon: Teressa Toribio SQUIBB, MD;  Location: [facility] ENDOSCOPY;   Service: Endoscopy;  Laterality: N/A;   HOT HEMOSTASIS N/A 10/22/2022   Procedure: HOT HEMOSTASIS (ARGON PLASMA COAGULATION/BICAP);  Surgeon: Legrand Victory LITTIE DOUGLAS, MD;  Location: [facility] ENDOSCOPY;  Service: Gastroenterology;  Laterality: N/A;   INSERTION OF DIALYSIS CATHETER Right 06/16/2017   Procedure: INSERTION OF DIALYSIS CATHETER;  Surgeon: Laurence Redell LITTIE, MD;  Location: [facility] OR;  Service: Vascular;  Laterality: Right;   IR AV DIALY SHUNT INTRO NEEDLE/INTRACATH INITIAL W/PTA/IMG LEFT  06/21/2018   POLYPECTOMY  03/13/2023   Procedure: POLYPECTOMY;  Surgeon: San Sandor GAILS, DO;  Location: [facility] ENDOSCOPY;  Service: Gastroenterology;;   REPAIR OF PERFORATED ULCER  1990   gastric ulcer   SCLEROTHERAPY  10/22/2022   Procedure: SCLEROTHERAPY;  Surgeon: Legrand Victory LITTIE DOUGLAS, MD;  Location: [facility] ENDOSCOPY;  Service: Gastroenterology;;   SUBMUCOSAL TATTOO INJECTION  10/22/2022   Procedure: SUBMUCOSAL TATTOO INJECTION;  Surgeon: Legrand Victory LITTIE DOUGLAS, MD;  Location: [facility] ENDOSCOPY;  Service: Gastroenterology;;   Family History  Family History  Problem Relation Age of Onset   Hypertension Father    Cancer Father    Hyperlipidemia Father    Seizures Sister    Early death Daughter  Kidney disease Daughter        end stage dialysis dependent    Breast cancer Maternal Aunt    Social History  reports that she has been smoking cigarettes. She has a 10 pack-year smoking history. She has been exposed to tobacco smoke. She has never used smokeless tobacco. She reports that she does not currently use drugs after having used the following drugs: Heroin, Marijuana, and Cocaine. She reports that she does not drink alcohol. Allergies  Allergies  Allergen Reactions   Aspirin  Nausea And Vomiting    Stomach ache   Ibuprofen Nausea And Vomiting    Stomach ache   Home medications Prior to Admission medications   Medication Sig Start Date End Date Taking? Authorizing Provider  amLODipine  (NORVASC ) 10 MG tablet Take 1 tablet  (10 mg total) by mouth daily. Patient taking differently: Take 10 mg by mouth at bedtime. 11/11/16  Yes Phelps, Jazma Y, DO  CREON  12000-38000 units CPEP capsule Take 4 capsules by mouth 2 (two) times daily with a meal. 03/02/23  Yes [provider]  gabapentin  (NEURONTIN ) 100 MG capsule Take 200 mg by mouth 4 (four) times a week.   Yes [provider]  hydrALAZINE  (APRESOLINE ) 25 MG tablet Take 3 tablets (75 mg total) by mouth every 8 (eight) hours. 11/14/20 03/09/24 Yes Austria, Eric J, DO  pantoprazole  (PROTONIX ) 40 MG tablet Take 1 tablet (40 mg total) by mouth daily. 03/05/23 10/07/23 Yes Barrett, Jamie N, PA-C  sevelamer  carbonate (RENVELA ) 800 MG tablet Take 800 mg by mouth 3 (three) times daily with meals. 07/23/20  Yes [provider]     Vitals:   06/18/23 0730 06/18/23 0800 06/18/23 0830 06/18/23 0900  BP: (!) 156/63 (!) 150/64 (!) 142/62 (!) 150/64  Pulse: 67 64 66 66  Resp: 18 13 12 13   Temp:      TempSrc:      SpO2: 100% 100% 100% 100%   Exam Gen alert, no distress, 2L [place] No rash, cyanosis or gangrene Sclera anicteric, throat clear  No jvd or bruits Chest R side dec'd BS, L side CTA, no rales/ wheezing RRR no RG Abd soft ntnd no mass or ascites +bs GU defer MS no joint effusions or deformity Ext no LE or UE edema, no wounds or ulcers Neuro is alert, Ox 3 , nf    LUA AVF+bruit     [place] [facility] 3.5h   350/500   46kg  2/2.5 bath   AVF   Heparin  none - last HD 1/06 post wt 46.2kg - hectorol  6 mcg  - no esa, last Hb 11s    Assessment/ Plan: Abdominal pain - w/ hx of acute/ chronic pancreatitis. Per GI/ pmd.  AHRF - on  O2 w/ wet cough, non-purulent. Will get CXR.  Hyperkalemia - K+ 6.3 this am, no EKG changes. Lokelma  ordered x 1. Plan HD 2nd shift.   ESRD - on HD MWF. Missed HD yest due to transportation issues (see above). Plan HD later today/ tonight.  HTN - norvasc  19/ hydralazine  75 tid at home.  Volume - getting to dry wt, no vol  excess. UF 2-3 L w/ HD today.  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 Follow.  Secondary hyperparathyroidism - renvela  1 ac tid. CCa is low, phos a bit high. Cont binder w/ meals and IV vdra.       Myer Fret  MD [facility] 06/18/2023, 9:18 AM  Recent Labs  Lab  06/18/23 0321 06/18/23 0740  HGB 10.7* 10.9*  ALBUMIN  3.1* 3.1*  CALCIUM  7.3* 7.5*  PHOS  --  6.0*  CREATININE 10.06* 10.01*  K 5.9* 6.3*   Inpatient medications:  amLODipine   10 mg Oral Daily   hydrALAZINE   75 mg Oral Q8H   lipase/protease/amylase  48,000 Units Oral BID WC   pantoprazole  (PROTONIX ) IV  40 mg Intravenous Q24H   sevelamer  carbonate  800 mg Oral TID WC   sodium zirconium cyclosilicate   10 g Oral Daily    anticoagulant sodium citrate      anticoagulant sodium citrate , fentaNYL  (SUBLIMAZE ) injection, heparin , lidocaine  (PF), lidocaine -prilocaine , pentafluoroprop-tetrafluoroeth

## 2023-06-18 NOTE — Consult Note (Addendum)
 Consultation  Referring Provider:  [place] [name]  Primary Care Physician:  Ashley Prentice [name], FNP Primary Gastroenterologist:  Dr. Albertus       Reason for Consultation: diarrhea and abdominal pain      LOS: 0 days          HPI:   Ashley [name] is [name] 73 [name].o. female with past medical history significant for ESRD on HD (MWF), hypertension, CVA, history of Hep C/treated, prior history of perforated peptic ulcer disease, previously documented duodenal AVMs, and chronic pancreatitis with exocrine pancreatic insufficiency  presents for evaluation of diarrhea and abdominal pain.  Recently admitted 05/28/2023 acute on chronic pancreatitis with MRCP showing loss of pancreas parenchyma and pancreatic ductal narrowing. Distention of pancreatic duct measuring up to 7mm. Limited assessment due to motion artifact. Patient is scheduled for outpatient OV with Ashley [name] 1/16 and outpatient EUS in March.  Patient presents to ED 1/8 for diarrhea and abdominal pain. She states she has had 2-3 days of diarrhea. Prior to that she was having regular bowel movements. She notes chronic abdominal pain secondary to her chronic pancreatitis but noted it became increased when the diarrhea started which prompted her visit to the ED. She feels she may have had fever/chills but never took her temperature. Denies sick contacts. Denies melena/hematochezia.  Notable workup Lipase 73 BUN 32, Cr. 10.06 Hgb 10.9, MCV 90.5 No leukocytosis Normal LFTs CT ab/pelvis wo contrast shows no peripancreatic inflammatory change. Mild pancreatic ductal dilation noted. Diffuse chronic gastric wall thickening (similar to CT in December). [place] edema and small volume adjacent to the liver. Cardiomegaly. Atrophic kidneys. RUQ US  negative for gallstones. Possible nodularity of the liver BP 153/66 HR 67 Temp 98.3  PREVIOUS [name] WORKUP   October 2024 for [name] bleed colonoscopy per Dr. Legrand with removal of 3 small sessile polyps all 3 to 8 mm in  size, also noted to have internal hemorrhoids.  EGD at that same time with [name] single 4 mm nodule in the gastric antrum, and very mild changes of gastritis.  Biopsies from the nodule showed this to be [name] hyperplastic polyp, gastric biopsy showed chronic inactive gastritis no H. pylori does have intestinal metaplasia, the colon polyps were all tubular adenomas No high-grade dysplasia.  Past Medical History:  Diagnosis Date  . Acute pancreatitis 2000   2000, 12/2018, 08/2019  . Arthritis   . Cervical radiculopathy 02/28/2011  . Cocaine substance abuse (HCC) 05/26/2013   positive UDS   . Duodenitis   . Erosive gastropathy   . ESRD on hemodialysis (HCC)    TTS  . GERD (gastroesophageal reflux disease)   . 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  genotype 2B.  Epclusa  started early 04/2020.    Ashley [name] Hiatal hernia   . Hyperlipidemia 2015  . Hypertension 2008  . Marijuana abuse 05/27/2003   positive UDS, family members smoke as well  . Pancreatitis   . 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 Schatzki's ring   . Stroke (HCC) 06/2017   MRI:MRI: small, subacute left internal capsule infarct.  Chronic microvascular ischemic changes w parenchymal volume loss. Chronic white matter periventricular microhemorrhage, likely due to htn  . Ulcer 1990   gastric ulcer. Ruptured s/p emergency repair    Surgical History:  She  has [name] past surgical history that includes Abdominal hysterectomy (1979); Repair of perforated ulcer (1990); Esophagogastroduodenoscopy (N/[name], 05/29/2013); Insertion of dialysis catheter (Right, 06/16/2017); AV fistula placement (Left, 06/16/2017);  Exchange of [name] dialysis catheter (Left, 07/31/2017); Bascilic vein transposition (Left, 10/02/2017); IR AV DIALY SHUNT INTRO NEEDLE/INTRACATH INITIAL W/PTA/IMG LEFT (06/21/2018); Esophagogastroduodenoscopy (05/2013); Esophagogastroduodenoscopy (egd) with propofol  (N/[name], 09/06/2019); biopsy (09/06/2019); Hot hemostasis (N/[name], 09/06/2019); EUS (N/[name], 02/02/2020);  Esophagogastroduodenoscopy (egd) with propofol  (N/[name], 02/02/2020); Esophagogastroduodenoscopy (egd) with propofol  (N/[name], 02/07/2021); Hot hemostasis (N/[name], 02/07/2021); Hemostasis clip placement (02/07/2021); enteroscopy (N/[name], 10/22/2022); Submucosal tattoo injection (10/22/2022); Hot hemostasis (N/[name], 10/22/2022); Sclerotherapy (10/22/2022); Hemostasis clip placement (10/22/2022); Colonoscopy with propofol  (N/[name], 03/13/2023); Esophagogastroduodenoscopy (egd) with propofol  (N/[name], 03/13/2023); biopsy (03/13/2023); polypectomy (03/13/2023); and Hemostasis clip placement (03/13/2023). Family History:  Her family history includes Breast cancer in her maternal aunt; Cancer in her father; Early death in her daughter; Hyperlipidemia in her father; Hypertension in her father; Kidney disease in her daughter; Seizures in her sister. Social History:   reports that she has been smoking cigarettes. She has [name] 10 pack-year smoking history. She has been exposed to tobacco smoke. She has never used smokeless tobacco. She reports that she does not currently use drugs after having used the following drugs: Heroin, Marijuana, and Cocaine. She reports that she does not drink alcohol.  Prior to Admission medications   Medication Sig Start Date End Date Taking? Authorizing Provider  amLODipine  (NORVASC ) 10 MG tablet Take 1 tablet (10 mg total) by mouth daily. Patient taking differently: Take 10 mg by mouth at bedtime. 11/11/16  Yes Phelps, Ashley Y, DO  CREON  12000-38000 units CPEP capsule Take 4 capsules by mouth 2 (two) times daily with [name] meal. 03/02/23  Yes [provider]  gabapentin  (NEURONTIN ) 100 MG capsule Take 200 mg by mouth 4 (four) times [name] week.   Yes [provider]  hydrALAZINE  (APRESOLINE ) 25 MG tablet Take 3 tablets (75 mg total) by mouth every 8 (eight) hours. 11/14/20 03/09/24 Yes [place], Ashley J, DO  pantoprazole  (PROTONIX ) 40 MG tablet Take 1 tablet (40 mg total) by mouth daily. 03/05/23 10/07/23 Yes Barrett, Ashley SAILOR,  PA-C  sevelamer  carbonate (RENVELA ) 800 MG tablet Take 800 mg by mouth 3 (three) times daily with meals. 07/23/20  Yes [provider]    Current Facility-Administered Medications  Medication Dose Route Frequency Provider Last Rate Last Admin  . amLODipine  (NORVASC ) tablet 10 mg  10 mg Oral Daily Franky Redia SAILOR, MD   10 mg at 06/18/23 9041  . anticoagulant sodium citrate  solution 5 mL  5 mL Intracatheter PRN Stovall, Kathryn R, PA-C      . fentaNYL  (SUBLIMAZE ) injection 12.5 mcg  12.5 mcg Intravenous Q4H PRN Kakrakandy, Arshad N, MD   12.5 mcg at 06/18/23 0809  . heparin  injection 1,000 Units  1,000 Units Intracatheter PRN Stovall, Kathryn R, PA-C      . hydrALAZINE  (APRESOLINE ) tablet 75 mg  75 mg Oral Q8H Franky Redia SAILOR, MD   75 mg at 06/18/23 0744  . lidocaine  (PF) (XYLOCAINE ) 1 % injection 5 mL  5 mL Intradermal PRN Stovall, Kathryn R, PA-C      . lidocaine -prilocaine  (EMLA ) cream 1 Application  1 Application Topical PRN Stovall, Kathryn R, PA-C      . lipase/protease/amylase (CREON ) capsule 48,000 Units  48,000 Units Oral BID WC Kakrakandy, Arshad N, MD      . pantoprazole  (PROTONIX ) injection 40 mg  40 mg Intravenous Q24H Franky Redia SAILOR, MD   40 mg at 06/18/23 0736  . pentafluoroprop-tetrafluoroeth (GEBAUERS) aerosol 1 Application  1 Application Topical PRN Stovall, Kathryn R, PA-C      . sevelamer  carbonate (RENVELA ) tablet 800  mg  800 mg Oral TID WC Franky Redia SAILOR, MD      . sodium zirconium cyclosilicate  (LOKELMA ) packet 10 g  10 g Oral Daily Vernon Ranks, MD   10 g at 06/18/23 9042   Current Outpatient Medications  Medication Sig Dispense Refill  . amLODipine  (NORVASC ) 10 MG tablet Take 1 tablet (10 mg total) by mouth daily. (Patient taking differently: Take 10 mg by mouth at bedtime.) 90 tablet 1  . CREON  12000-38000 units CPEP capsule Take 4 capsules by mouth 2 (two) times daily with [name] meal.    . gabapentin  (NEURONTIN ) 100 MG capsule Take 200 mg by  mouth 4 (four) times [name] week.    . hydrALAZINE  (APRESOLINE ) 25 MG tablet Take 3 tablets (75 mg total) by mouth every 8 (eight) hours. 270 tablet 2  . pantoprazole  (PROTONIX ) 40 MG tablet Take 1 tablet (40 mg total) by mouth daily. 30 tablet 2  . sevelamer  carbonate (RENVELA ) 800 MG tablet Take 800 mg by mouth 3 (three) times daily with meals.      Allergies as of 06/17/2023 - Reviewed 06/17/2023  Allergen Reaction Noted  . Aspirin  Nausea And Vomiting 07/18/2009  . Ibuprofen Nausea And Vomiting 12/24/2009    Review of Systems  Constitutional:  Negative for chills, fever and weight loss.  HENT:  Negative for hearing loss and tinnitus.   Eyes:  Negative for blurred vision and double vision.  Respiratory:  Negative for cough and hemoptysis.   Cardiovascular:  Negative for chest pain and palpitations.  Gastrointestinal:  Positive for abdominal pain and diarrhea. Negative for blood in stool, constipation, heartburn, melena, nausea and vomiting.  Genitourinary:  Negative for dysuria and urgency.  Musculoskeletal:  Negative for myalgias and neck pain.  Skin:  Negative for itching and rash.  Neurological:  Negative for seizures and loss of consciousness.  Psychiatric/Behavioral:  Negative for depression and suicidal ideas.        Physical Exam:  Vital signs in last 24 hours: Temp:  [97.7 F (36.5 C)-99.1 F (37.3 C)] 98.3 F (36.8 C) (01/09 0956) Pulse Rate:  [63-73] 65 (01/09 0930) Resp:  [11-18] 12 (01/09 0930) BP: (142-165)/(62-73) 153/66 (01/09 0930) SpO2:  [84 %-100 %] 100 % (01/09 0930)   Last BM recorded by nurses in past 5 days No data recorded  Physical Exam Constitutional:      Appearance: She is not ill-appearing.  HENT:     Head: Normocephalic and atraumatic.     Nose: Nose normal. No congestion.     Mouth/Throat:     Mouth: Mucous membranes are moist.     Pharynx: Oropharynx is clear.  Eyes:     General: No scleral icterus.    Extraocular Movements: Extraocular  movements intact.  Cardiovascular:     Rate and Rhythm: Normal rate and regular rhythm.  Pulmonary:     Effort: Pulmonary effort is normal. No respiratory distress.     Breath sounds: Normal breath sounds.  Abdominal:     General: Abdomen is flat. Bowel sounds are normal. There is no distension.     Tenderness: There is abdominal tenderness (generalized tenderness).  Musculoskeletal:     Cervical back: Normal range of motion and neck supple.  Skin:    General: Skin is warm and dry.     Coloration: Skin is not jaundiced.  Neurological:     General: No focal deficit present.     Mental Status: She is alert and oriented to person, place, and  time.  Psychiatric:        Mood and Affect: Mood normal.        Behavior: Behavior normal.        Thought Content: Thought content normal.        Judgment: Judgment normal.      LAB RESULTS: Recent Labs    06/17/23 1540 06/18/23 0321 06/18/23 0740  WBC 8.0 7.3 6.8  HGB 13.1 10.7* 10.9*  HCT 40.7 34.4* 35.3*  PLT 229 202 223   BMET Recent Labs    06/17/23 1648 06/18/23 0321 06/18/23 0740  NA 140 138 140  K 5.4* 5.9* 6.3*  CL 98 101 100  CO2 26 24 24   GLUCOSE 94 85 77  BUN 31* 32* 38*  CREATININE 9.12* 10.06* 10.01*  CALCIUM  7.3* 7.3* 7.5*   LFT Recent Labs    06/18/23 0321 06/18/23 0740  PROT 6.4*  --   ALBUMIN  3.1* 3.1*  AST 15  --   ALT 9  --   ALKPHOS 67  --   BILITOT 0.6  --    PT/INR No results for input(s): LABPROT, INR in the last 72 hours.  STUDIES: US  Abdomen Limited RUQ (LIVER/GB) Result Date: 06/17/2023 CLINICAL DATA:  Epigastric pain EXAM: ULTRASOUND ABDOMEN LIMITED RIGHT UPPER QUADRANT COMPARISON:  CT 06/17/2023, ultrasound 02/06/2021 FINDINGS: Gallbladder: No gallstones or wall thickening visualized. No sonographic Murphy sign noted by sonographer. Common bile duct: Diameter: 1.6 mm Liver: No focal lesion identified. Within normal limits in parenchymal echogenicity. Portal vein is patent on color  Doppler imaging with normal direction of blood flow towards the liver. Possible slight surface nodularity of the liver. Other: Small volume ascites in the right upper quadrant. IMPRESSION: 1. Negative for gallstones or biliary dilatation. 2. Possible slight surface nodularity of the liver, correlate for cirrhosis risk factors. Small volume ascites in the right upper quadrant. Electronically Signed   By: Luke Bun M.D.   On: 06/17/2023 21:32   CT ABDOMEN PELVIS WO CONTRAST Result Date: 06/17/2023 CLINICAL DATA:  Abdomen pain possible pancreatitis EXAM: CT ABDOMEN AND PELVIS WITHOUT CONTRAST TECHNIQUE: Multidetector CT imaging of the abdomen and pelvis was performed following the standard protocol without IV contrast. RADIATION DOSE REDUCTION: This exam was performed according to the departmental dose-optimization program which includes automated exposure control, adjustment of the mA and/or kV according to patient size and/or use of iterative reconstruction technique. COMPARISON:  MRI 05/27/2023, CT 05/26/2023, 03/10/2023 FINDINGS: Lower chest: Lung bases demonstrate chronic reticulation and fibrosis. No acute airspace disease. Cardiomegaly Hepatobiliary: No focal liver abnormality is seen. No gallstones, gallbladder wall thickening, or biliary dilatation. Pancreas: Mild pancreatic ductal dilatation measuring up to 5 mm again noted. No definite acute peripancreatic inflammatory change. Spleen: Normal in size without focal abnormality. Adrenals/Urinary Tract: Adrenal glands are thickened bilaterally but without dominant mass. Atrophic kidneys without hydronephrosis. The bladder is nearly empty Stomach/Bowel: The stomach is nonenlarged. Possible diffuse gastric wall thickening. There is no dilated small bowel. Linear density within the right colon, may reflect hemo static clip. Negative appendix. No acute bowel wall thickening Vascular/Lymphatic: Advanced aortic atherosclerosis. No aneurysm. No suspicious lymph  node Reproductive: Hysterectomy.  No adnexal mass Other: No free air. Generalized subcutaneous edema. Small volume free fluid adjacent to the liver. Musculoskeletal: No acute or suspicious osseous abnormality. IMPRESSION: 1. No CT evidence for acute peripancreatic inflammatory change. Mild pancreatic ductal dilatation again noted, reference MRCP 05/27/2023 2. Decompressed stomach with suspected diffuse chronic gastric wall thickening. 3. Generalized subcutaneous edema  and small volume free fluid adjacent to the liver. 4. Cardiomegaly. 5. Atrophic kidneys. 6. Aortic atherosclerosis. Aortic Atherosclerosis (ICD10-I70.0). Electronically Signed   By: Luke Bun M.D.   On: 06/17/2023 21:29      Impression    Diarrhea and abdominal pain Acute diarrhea with generalized abdominal pain. CT without pancreatic inflammation. Lipase 73. Suspect this may be more acute infectious etiology versus her chronic pancreatitis -- [name] pathogen panel -- continue supportive care and IVF -- hgb 10.9, likely hemodilution in the setting of IVF, no overt bleeding  Acute on chronic pancreatitis with abnormal MRCP lipase 73 MRCP 05/27/23 showing loss of pancreas parenchyma and pancreatic ductal narrowing. Distention of pancreatic duct measuring up to 7mm. Limited assessment due to motion artifact.  CT 1/8 with no pancreatic inflammation and stable pancreatic ductal dilation -- Patient is scheduled for outpatient OV with [place] [name] 1/16 and outpatient EUS in March. -- Continue Creon  48,000 p.o. with meals. If infectious workup is negative can consider increasing creon  with hopes to improve diarrhea if diarrhea is secondary to her pancreatic insufficiency  Thank you for your kind consultation, we will continue to follow.  Ashley [name]  06/18/2023, 10:39 AM    Attending physician's note  I have taken [name] history, reviewed the chart and examined the patient. I performed [name] substantive portion of this encounter, including  complete performance of at least one of the key components, in conjunction with the APP. I agree with the APP's note, impression and recommendations.    73 year old female with end-stage renal disease on hemodialysis, chronic pancreatitis, history of hep C s/p treatment with SVR here with diarrhea, nausea and abdominal pain. Patient has not had [name] bowel movement since admission, possible etiology acute infectious gastroenteritis/norovirus   She is scheduled for outpatient EUS for follow-up of pancreatitis in March Hemoglobin stable No plan for endoscopic procedures during this hospitalization Check stool [name] pathogen panel if she continues to have additional episodes of diarrhea Continue supportive care Advance diet as tolerated  The patient was provided an opportunity to ask questions and all were answered. The patient agreed with the plan and demonstrated an understanding of the instructions.  Ashley [name] , MD [phone]

## 2023-06-19 DIAGNOSIS — K861 Other chronic pancreatitis: Secondary | ICD-10-CM | POA: Diagnosis not present

## 2023-06-19 DIAGNOSIS — R1084 Generalized abdominal pain: Secondary | ICD-10-CM | POA: Diagnosis not present

## 2023-06-19 DIAGNOSIS — R197 Diarrhea, unspecified: Secondary | ICD-10-CM | POA: Diagnosis not present

## 2023-06-19 DIAGNOSIS — R1010 Upper abdominal pain, unspecified: Secondary | ICD-10-CM | POA: Diagnosis not present

## 2023-06-19 LAB — BASIC METABOLIC PANEL
Anion gap: 14 (ref 5–15)
BUN: 17 mg/dL (ref 8–23)
CO2: 28 mmol/L (ref 22–32)
Calcium: 7.7 mg/dL — ABNORMAL LOW (ref 8.9–10.3)
Chloride: 95 mmol/L — ABNORMAL LOW (ref 98–111)
Creatinine, Ser: 7.06 mg/dL — ABNORMAL HIGH (ref 0.44–1.00)
GFR, Estimated: 6 mL/min — ABNORMAL LOW (ref >60)
Glucose, Bld: 85 mg/dL (ref 70–99)
Potassium: 4.3 mmol/L (ref 3.5–5.1)
Sodium: 137 mmol/L (ref 135–145)

## 2023-06-19 LAB — GLUCOSE, CAPILLARY
Glucose-Capillary: 84 mg/dL (ref 70–99)
Glucose-Capillary: 153 mg/dL — ABNORMAL HIGH (ref 70–99)
Glucose-Capillary: 193 mg/dL — ABNORMAL HIGH (ref 70–99)

## 2023-06-19 LAB — HEPATITIS B SURFACE ANTIGEN: Hepatitis B Surface Ag: NONREACTIVE

## 2023-06-19 LAB — MRSA NEXT GEN BY PCR, NASAL: MRSA by PCR Next Gen: NOT DETECTED

## 2023-06-19 MED ORDER — PROCHLORPERAZINE EDISYLATE 10 MG/2ML IJ SOLN: 5.0000 mg | Freq: Four times a day (QID) | INTRAMUSCULAR | Status: DC | PRN

## 2023-06-19 MED ORDER — PREDNISONE 20 MG PO TABS
40.0000 mg | ORAL_TABLET | Freq: Every day | ORAL | Status: DC
  Administered 2023-06-20: 40 mg via ORAL
  Filled 2023-06-19: qty 2

## 2023-06-19 MED ORDER — OXYCODONE HCL 5 MG PO TABS
5.0000 mg | ORAL_TABLET | Freq: Four times a day (QID) | ORAL | Status: DC | PRN
Start: 2023-06-19 — End: 2023-06-20
  Administered 2023-06-19: 5 mg via ORAL
  Filled 2023-06-19: qty 1

## 2023-06-19 MED ORDER — ACETAMINOPHEN 325 MG PO TABS: 650.0000 mg | ORAL_TABLET | Freq: Four times a day (QID) | ORAL | Status: DC | PRN

## 2023-06-19 MED ORDER — MELATONIN 5 MG PO TABS: 5.0000 mg | ORAL_TABLET | Freq: Every evening | ORAL | Status: DC | PRN

## 2023-06-19 MED ORDER — METHYLPREDNISOLONE SODIUM SUCC 40 MG IJ SOLR
40.0000 mg | Freq: Two times a day (BID) | INTRAMUSCULAR | Status: AC
  Administered 2023-06-19: 40 mg via INTRAVENOUS
  Filled 2023-06-19 (×2): qty 1

## 2023-06-19 MED ORDER — IPRATROPIUM-ALBUTEROL 0.5-2.5 (3) MG/3ML IN SOLN
3.0000 mL | Freq: Four times a day (QID) | RESPIRATORY_TRACT | Status: DC
Start: 2023-06-19 — End: 2023-06-20
  Administered 2023-06-19 (×2): 3 mL via RESPIRATORY_TRACT
  Filled 2023-06-19 (×2): qty 3

## 2023-06-19 MED ORDER — POLYETHYLENE GLYCOL 3350 17 G PO PACK: 17.0000 g | PACK | Freq: Every day | ORAL | Status: DC | PRN

## 2023-06-19 MED ORDER — OXYCODONE HCL 5 MG PO TABS
5.0000 mg | ORAL_TABLET | Freq: Once | ORAL | Status: AC
  Administered 2023-06-19: 5 mg via ORAL
  Filled 2023-06-19: qty 1

## 2023-06-19 MED ORDER — RENA-VITE PO TABS
1.0000 | ORAL_TABLET | Freq: Every day | ORAL | Status: DC
  Administered 2023-06-19: 1 via ORAL
  Filled 2023-06-19: qty 1

## 2023-06-19 MED ORDER — GUAIFENESIN ER 600 MG PO TB12
600.0000 mg | ORAL_TABLET | Freq: Two times a day (BID) | ORAL | Status: DC
  Administered 2023-06-19 – 2023-06-20 (×3): 600 mg via ORAL
  Filled 2023-06-19 (×3): qty 1

## 2023-06-19 NOTE — Progress Notes (Signed)
 PROGRESS NOTE  Ashley [name]  FMW:[id number] DOB: 01-Apr-1951 DOA: 06/17/2023 PCP: Claudene Prentice [name], FNP   Brief Narrative: Patient is a 49 female with history of chronic pancreatitis, COPD, ESRD on dialysis, hypertension, recently admitted for acute on chronic pancreatitis who presented with abdominal pain, diarrhea.  CT abdomen/pelvis did not show any acute peripancreatic inflammation but possible gastric wall thickening.  Ultrasound of the abdomen showed cirrhotic changes.  Lipase of 73.  GI consulted .  Looks like abdominal pain has resolved.  Diarrhea stopped.  Found to be severely wheezing today and hypoxic.  Started on steroids.  Assessment & Plan:  Principal Problem:   Abdominal pain Active Problems:   ESRD on hemodialysis (HCC)   Acute on chronic pancreatitis (HCC)   Hypertensive urgency   Nausea vomiting and diarrhea  Abdominal pain/concern for acute on chronic pancreatitis/diarrhea: Initially presented with abdominal pain, diarrhea.  The symptoms have improved.  Diarrhea has completely stopped.  Abdomen is mostly benign on examination.  Lipase of 73.  CT abdomen/pelvis shows no acute inflammation but possible gastric wall thickening.  Started on Protonix .  Ultrasound of the abdomen showed cirrhotic changes.  GI was consulted and following.  Started on diet today.  Acute hypoxic respiratory failure/COPD exacerbation: Found to be wheezing.  Not on oxygen at home.  Currently on 2 L of oxygen.  Will continue to wean.  Started on steroids.  Smoker: Counseled cessation  ESRD on dialysis: Nephrology following for dialysis.  Hyperkalemia resolved  GERD: Continue Protonix   Hypertension: Continue amlodipine , hydralazine          DVT prophylaxis:SCDs Start: 06/18/23 0146     Code Status: Full Code  Family Communication: None at bedside  Patient status:Inpatient  Patient is from :home  Anticipated discharge un:ynfz  Estimated DC date:tomorrow   Consultants: Nephrology,  GI  Procedures: Dialysis  Antimicrobials:  Anti-infectives (From admission, onward)    None       Subjective: Patient seen and examined at bedside today.  She states she is very hungry and wants to eat.  Has some abdominal discomfort but does not look in any kind of distress.  No diarrhea at all.  Abdomin is benign on examination.  On 2 L of oxygen, found to be severely wheezing.  Objective: Vitals:   06/18/23 1936 06/18/23 2100 06/19/23 0419 06/19/23 0741  BP: (!) 170/72 (!) 172/73 (!) 154/64 (!) 145/66  Pulse: 82 79 85 84  Resp: 18 20 20 18   Temp: 98.4 F (36.9 C) 98.2 F (36.8 C) 99 F (37.2 C) 99.4 F (37.4 C)  TempSrc: Oral Oral Oral Oral  SpO2: 97% 94% (!) 82% 92%  Weight:        Intake/Output Summary (Last 24 hours) at 06/19/2023 0820 Last data filed at 06/19/2023 0419 Gross per 24 hour  Intake 120 ml  Output 2700 ml  Net -2580 ml   Filed Weights   06/18/23 1437  Weight: 46 kg    Examination:  General exam: Overall comfortable, not in distress, deconditioned HEENT: PERRL Respiratory system: Diminished sounds bilaterally, bilateral expiratory wheezing Cardiovascular system: S1 & S2 heard, RRR.  Gastrointestinal system: Abdomen is nondistended, soft and nontender. Central nervous system: Alert and oriented Extremities: No edema, no clubbing ,no cyanosis, AV fistula  on the left upper extremity Skin: No rashes, no ulcers,no icterus     Data Reviewed: I have personally reviewed following labs and imaging studies  CBC: Recent Labs  Lab 06/17/23 1540 06/18/23 0321 06/18/23 0740  WBC 8.0 7.3 6.8  HGB 13.1 10.7* 10.9*  HCT 40.7 34.4* 35.3*  MCV 88.7 88.7 90.5  PLT 229 202 223   Basic Metabolic Panel: Recent Labs  Lab 06/17/23 1648 06/18/23 0321 06/18/23 0740  NA 140 138 140  K 5.4* 5.9* 6.3*  CL 98 101 100  CO2 26 24 24   GLUCOSE 94 85 77  BUN 31* 32* 38*  CREATININE 9.12* 10.06* 10.01*  CALCIUM  7.3* 7.3* 7.5*  MG 1.8  --   --   PHOS  --    --  6.0*     No results found for this or any previous visit (from the past 240 hours).   Radiology Studies: DG CHEST PORT 1 VIEW Result Date: 06/18/2023 CLINICAL DATA:  Hypoxia.  Upper abdominal pain. EXAM: PORTABLE CHEST 1 VIEW COMPARISON:  05/21/2023. FINDINGS: Mild pulmonary vascular congestion, similar to the prior study. Bilateral lung fields are otherwise clear. No acute consolidation or lung collapse. Bilateral costophrenic angles are clear. Stable cardio-mediastinal silhouette. No acute osseous abnormalities. The soft tissues are within normal limits. Left proximal arm vascular stent again seen. IMPRESSION: *Mild pulmonary vascular congestion, similar to the prior study. Electronically Signed   By: Ree Molt M.D.   On: 06/18/2023 13:01   US  Abdomen Limited RUQ (LIVER/GB) Result Date: 06/17/2023 CLINICAL DATA:  Epigastric pain EXAM: ULTRASOUND ABDOMEN LIMITED RIGHT UPPER QUADRANT COMPARISON:  CT 06/17/2023, ultrasound 02/06/2021 FINDINGS: Gallbladder: No gallstones or wall thickening visualized. No sonographic Murphy sign noted by sonographer. Common bile duct: Diameter: 1.6 mm Liver: No focal lesion identified. Within normal limits in parenchymal echogenicity. Portal vein is patent on color Doppler imaging with normal direction of blood flow towards the liver. Possible slight surface nodularity of the liver. Other: Small volume ascites in the right upper quadrant. IMPRESSION: 1. Negative for gallstones or biliary dilatation. 2. Possible slight surface nodularity of the liver, correlate for cirrhosis risk factors. Small volume ascites in the right upper quadrant. Electronically Signed   By: Luke Bun M.D.   On: 06/17/2023 21:32   CT ABDOMEN PELVIS WO CONTRAST Result Date: 06/17/2023 CLINICAL DATA:  Abdomen pain possible pancreatitis EXAM: CT ABDOMEN AND PELVIS WITHOUT CONTRAST TECHNIQUE: Multidetector CT imaging of the abdomen and pelvis was performed following the standard protocol  without IV contrast. RADIATION DOSE REDUCTION: This exam was performed according to the departmental dose-optimization program which includes automated exposure control, adjustment of the mA and/or kV according to patient size and/or use of iterative reconstruction technique. COMPARISON:  MRI 05/27/2023, CT 05/26/2023, 03/10/2023 FINDINGS: Lower chest: Lung bases demonstrate chronic reticulation and fibrosis. No acute airspace disease. Cardiomegaly Hepatobiliary: No focal liver abnormality is seen. No gallstones, gallbladder wall thickening, or biliary dilatation. Pancreas: Mild pancreatic ductal dilatation measuring up to 5 mm again noted. No definite acute peripancreatic inflammatory change. Spleen: Normal in size without focal abnormality. Adrenals/Urinary Tract: Adrenal glands are thickened bilaterally but without dominant mass. Atrophic kidneys without hydronephrosis. The bladder is nearly empty Stomach/Bowel: The stomach is nonenlarged. Possible diffuse gastric wall thickening. There is no dilated small bowel. Linear density within the right colon, may reflect hemo static clip. Negative appendix. No acute bowel wall thickening Vascular/Lymphatic: Advanced aortic atherosclerosis. No aneurysm. No suspicious lymph node Reproductive: Hysterectomy.  No adnexal mass Other: No free air. Generalized subcutaneous edema. Small volume free fluid adjacent to the liver. Musculoskeletal: No acute or suspicious osseous abnormality. IMPRESSION: 1. No CT evidence for acute peripancreatic inflammatory change. Mild pancreatic ductal dilatation again noted, reference  MRCP 05/27/2023 2. Decompressed stomach with suspected diffuse chronic gastric wall thickening. 3. Generalized subcutaneous edema and small volume free fluid adjacent to the liver. 4. Cardiomegaly. 5. Atrophic kidneys. 6. Aortic atherosclerosis. Aortic Atherosclerosis (ICD10-I70.0). Electronically Signed   By: Luke Bun M.D.   On: 06/17/2023 21:29    Scheduled  Meds:  amLODipine   10 mg Oral Daily   Chlorhexidine  Gluconate Cloth  6 each Topical Q0600   doxercalciferol   6 mcg Intravenous Q T,Th,Sa-HD   [START ON 06/22/2023] doxercalciferol   6 mcg Intravenous Q M,W,F-HD   hydrALAZINE   75 mg Oral Q8H   lipase/protease/amylase  48,000 Units Oral BID WC   pantoprazole  (PROTONIX ) IV  40 mg Intravenous Q24H   sevelamer  carbonate  800 mg Oral TID WC   sodium zirconium cyclosilicate   10 g Oral Daily   Continuous Infusions:   LOS: 1 day   Ivonne Mustache, MD [facility] P1/03/2024, 8:20 AM

## 2023-06-19 NOTE — Plan of Care (Signed)

## 2023-06-19 NOTE — Progress Notes (Addendum)
 Progress Note   LOS: 1 day   Chief Complaint:diarrhea and abdominal pain   Subjective   Patient states she has had no further diarrhea.  She states her abdominal pain has improved.  Tolerating diet without difficulty.  Feeling better overall.    Objective   Vital signs in last 24 hours: Temp:  [98.2 F (36.8 C)-99.4 F (37.4 C)] 99.1 F (37.3 C) (01/10 0847) Pulse Rate:  [61-92] 78 (01/10 0847) Resp:  [12-21] 18 (01/10 0847) BP: (137-178)/(59-82) 162/59 (01/10 0847) SpO2:  [82 %-100 %] 100 % (01/10 0847) Weight:  [46 kg] 46 kg (01/09 1437)   Last BM recorded by nurses in past 5 days No data recorded  General:   female in no acute distress Heart:  Regular rate and rhythm; no murmurs Pulm: Clear anteriorly; no wheezing Abdomen: soft, nondistended, normal bowel sounds in all quadrants. Nontender without guarding. No organomegaly appreciated. Extremities:  No edema Neurologic:  Alert and  oriented x4;  No focal deficits.  Psych:  Cooperative. Normal mood and affect.  Intake/Output from previous day: 01/09 0701 - 01/10 0700 In: 120 [P.O.:120] Out: 2700  Intake/Output this shift: No intake/output data recorded.  Studies/Results: DG CHEST PORT 1 VIEW Result Date: 06/18/2023 CLINICAL DATA:  Hypoxia.  Upper abdominal pain. EXAM: PORTABLE CHEST 1 VIEW COMPARISON:  05/21/2023. FINDINGS: Mild pulmonary vascular congestion, similar to the prior study. Bilateral lung fields are otherwise clear. No acute consolidation or lung collapse. Bilateral costophrenic angles are clear. Stable cardio-mediastinal silhouette. No acute osseous abnormalities. The soft tissues are within normal limits. Left proximal arm vascular stent again seen. IMPRESSION: *Mild pulmonary vascular congestion, similar to the prior study. Electronically Signed   By: Ree Molt M.D.   On: 06/18/2023 13:01   US  Abdomen Limited RUQ (LIVER/GB) Result Date: 06/17/2023 CLINICAL DATA:  Epigastric pain EXAM: ULTRASOUND  ABDOMEN LIMITED RIGHT UPPER QUADRANT COMPARISON:  CT 06/17/2023, ultrasound 02/06/2021 FINDINGS: Gallbladder: No gallstones or wall thickening visualized. No sonographic Murphy sign noted by sonographer. Common bile duct: Diameter: 1.6 mm Liver: No focal lesion identified. Within normal limits in parenchymal echogenicity. Portal vein is patent on color Doppler imaging with normal direction of blood flow towards the liver. Possible slight surface nodularity of the liver. Other: Small volume ascites in the right upper quadrant. IMPRESSION: 1. Negative for gallstones or biliary dilatation. 2. Possible slight surface nodularity of the liver, correlate for cirrhosis risk factors. Small volume ascites in the right upper quadrant. Electronically Signed   By: Luke Bun M.D.   On: 06/17/2023 21:32   CT ABDOMEN PELVIS WO CONTRAST Result Date: 06/17/2023 CLINICAL DATA:  Abdomen pain possible pancreatitis EXAM: CT ABDOMEN AND PELVIS WITHOUT CONTRAST TECHNIQUE: Multidetector CT imaging of the abdomen and pelvis was performed following the standard protocol without IV contrast. RADIATION DOSE REDUCTION: This exam was performed according to the departmental dose-optimization program which includes automated exposure control, adjustment of the mA and/or kV according to patient size and/or use of iterative reconstruction technique. COMPARISON:  MRI 05/27/2023, CT 05/26/2023, 03/10/2023 FINDINGS: Lower chest: Lung bases demonstrate chronic reticulation and fibrosis. No acute airspace disease. Cardiomegaly Hepatobiliary: No focal liver abnormality is seen. No gallstones, gallbladder wall thickening, or biliary dilatation. Pancreas: Mild pancreatic ductal dilatation measuring up to 5 mm again noted. No definite acute peripancreatic inflammatory change. Spleen: Normal in size without focal abnormality. Adrenals/Urinary Tract: Adrenal glands are thickened bilaterally but without dominant mass. Atrophic kidneys without  hydronephrosis. The bladder is nearly empty Stomach/Bowel:  The stomach is nonenlarged. Possible diffuse gastric wall thickening. There is no dilated small bowel. Linear density within the right colon, may reflect hemo static clip. Negative appendix. No acute bowel wall thickening Vascular/Lymphatic: Advanced aortic atherosclerosis. No aneurysm. No suspicious lymph node Reproductive: Hysterectomy.  No adnexal mass Other: No free air. Generalized subcutaneous edema. Small volume free fluid adjacent to the liver. Musculoskeletal: No acute or suspicious osseous abnormality. IMPRESSION: 1. No CT evidence for acute peripancreatic inflammatory change. Mild pancreatic ductal dilatation again noted, reference MRCP 05/27/2023 2. Decompressed stomach with suspected diffuse chronic gastric wall thickening. 3. Generalized subcutaneous edema and small volume free fluid adjacent to the liver. 4. Cardiomegaly. 5. Atrophic kidneys. 6. Aortic atherosclerosis. Aortic Atherosclerosis (ICD10-I70.0). Electronically Signed   By: Luke Bun M.D.   On: 06/17/2023 21:29    Lab Results: Recent Labs    06/17/23 1540 06/18/23 0321 06/18/23 0740  WBC 8.0 7.3 6.8  HGB 13.1 10.7* 10.9*  HCT 40.7 34.4* 35.3*  PLT 229 202 223   BMET Recent Labs    06/18/23 0321 06/18/23 0740 06/19/23 0835  NA 138 140 137  K 5.9* 6.3* 4.3  CL 101 100 95*  CO2 24 24 28   GLUCOSE 85 77 85  BUN 32* 38* 17  CREATININE 10.06* 10.01* 7.06*  CALCIUM  7.3* 7.5* 7.7*   LFT Recent Labs    06/18/23 0321 06/18/23 0740  PROT 6.4*  --   ALBUMIN  3.1* 3.1*  AST 15  --   ALT 9  --   ALKPHOS 67  --   BILITOT 0.6  --    PT/INR No results for input(s): LABPROT, INR in the last 72 hours.   Scheduled Meds: . amLODipine   10 mg Oral Daily  . Chlorhexidine  Gluconate Cloth  6 each Topical Q0600  . doxercalciferol   6 mcg Intravenous Q T,Th,Sa-HD  . [START ON 06/22/2023] doxercalciferol   6 mcg Intravenous Q M,W,F-HD  . guaiFENesin   600 mg  Oral BID  . hydrALAZINE   75 mg Oral Q8H  . ipratropium-albuterol   3 mL Nebulization Q6H  . lipase/protease/amylase  48,000 Units Oral BID WC  . methylPREDNISolone  (SOLU-MEDROL ) injection  40 mg Intravenous Q12H  . multivitamin  1 tablet Oral QHS  . pantoprazole  (PROTONIX ) IV  40 mg Intravenous Q24H  . [START ON 06/20/2023] predniSONE   40 mg Oral Q breakfast  . sevelamer  carbonate  800 mg Oral TID WC   Continuous Infusions:    Patient profile:   73 year old female with end-stage renal disease on hemodialysis, chronic pancreatitis, history of hep C s/p treatment with SVR here with diarrhea, nausea and abdominal pain.   Impression/Plan   Diarrhea and abdominal pain Acute diarrhea with generalized abdominal pain. CT without pancreatic inflammation. Lipase 73. Suspect this may be more acute infectious etiology versus her chronic pancreatitis. Reassuringly no further bowel movements and her pain is improving and she is tolerating her diet. -- GI pathogen panel if she has a stool -- continue supportive care and IVF -- continue diet as tolerated -- likely no further GI workup planned inpatient. Outpatient OV scheduled for 1/16   Acute on chronic pancreatitis with abnormal MRCP lipase 73 MRCP 05/27/23 showing loss of pancreas parenchyma and pancreatic ductal narrowing. Distention of pancreatic duct measuring up to 7mm. Limited assessment due to motion artifact.  CT 1/8 with no pancreatic inflammation and stable pancreatic ductal dilation -- Patient is scheduled for outpatient OV with [facility] 1/16 and outpatient EUS in March. -- Continue Creon  48,000  p.o. with meals. If infectious workup is negative can consider increasing creon  with hopes to improve diarrhea if diarrhea is secondary to her pancreatic insufficiency   Bayley M McMichael  06/19/2023, 1:22 PM   Attending physician's note   I have taken a history, reviewed the chart and examined the patient. I performed a substantive portion  of this encounter, including complete performance of at least one of the key components, in conjunction with the APP. I agree with the APP's note, impression and recommendations.   Diarrhea and abd pain resolved, tolerating diet F/u in GI office as already scheduled for chronic pancreatitis/abnormal MRCP.   Inpatient GI signing off,available if have any questions  The patient was provided an opportunity to ask questions and all were answered. The patient agreed with the plan and demonstrated an understanding of the instructions.   LOIS Wilkie Mcgee , MD [phone]

## 2023-06-19 NOTE — Progress Notes (Signed)
 [facility] Progress Note   Subjective:   Pt seen in room - laying in bed. Patient reports feeling well overall but is very hungry. She admits to some diarrhea but denies vomiting. Reports epigastric abdominal pain similar to her other episodes of acute on chronic pancreatitis. Denies CP/dyspnea. Patient got HD yesterday due to missing her Wednesday session. Will get HD tomorrow and then resume normal MWF schedule the following week.   Objective Vitals:   06/18/23 2100 06/19/23 0419 06/19/23 0741 06/19/23 0847  BP: (!) 172/73 (!) 154/64 (!) 145/66 (!) 162/59  Pulse: 79 85 84 78  Resp: 20 20 18 18   Temp: 98.2 F (36.8 C) 99 F (37.2 C) 99.4 F (37.4 C) 99.1 F (37.3 C)  TempSrc: Oral Oral Oral Oral  SpO2: 94% (!) 82% 92% 100%  Weight:       Physical Exam General: WNWD woman in NAD. Appears slightly uncomfortable laying in bed. O2 via [place] in place. Heart:RRR, 3+ murmur.  Lungs: CTAB Abdomen: soft, tender in epigastric and RUQ  Extremities: no LE edema Dialysis Access: LUE AVF +thrill/+bruit  Additional Objective Labs: Basic Metabolic Panel: Recent Labs  Lab 06/18/23 0321 06/18/23 0740 06/19/23 0835  NA 138 140 137  K 5.9* 6.3* 4.3  CL 101 100 95*  CO2 24 24 28   GLUCOSE 85 77 85  BUN 32* 38* 17  CREATININE 10.06* 10.01* 7.06*  CALCIUM  7.3* 7.5* 7.7*  PHOS  --  6.0*  --    Liver Function Tests: Recent Labs  Lab 06/17/23 1648 06/18/23 0321 06/18/23 0740  AST 16 15  --   ALT 9 9  --   ALKPHOS 76 67  --   BILITOT 0.7 0.6  --   PROT 7.2 6.4*  --   ALBUMIN  3.4* 3.1* 3.1*   Recent Labs  Lab 06/17/23 1648  LIPASE 73*   CBC: Recent Labs  Lab 06/17/23 1540 06/18/23 0321 06/18/23 0740  WBC 8.0 7.3 6.8  HGB 13.1 10.7* 10.9*  HCT 40.7 34.4* 35.3*  MCV 88.7 88.7 90.5  PLT 229 202 223   CBG: Recent Labs  Lab 06/18/23 0618 06/18/23 2059 06/18/23 2230 06/19/23 0416  GLUCAP 79 55* 115* 84    Studies/Results: DG CHEST PORT 1  VIEW Result Date: 06/18/2023 CLINICAL DATA:  Hypoxia.  Upper abdominal pain. EXAM: PORTABLE CHEST 1 VIEW COMPARISON:  05/21/2023. FINDINGS: Mild pulmonary vascular congestion, similar to the prior study. Bilateral lung fields are otherwise clear. No acute consolidation or lung collapse. Bilateral costophrenic angles are clear. Stable cardio-mediastinal silhouette. No acute osseous abnormalities. The soft tissues are within normal limits. Left proximal arm vascular stent again seen. IMPRESSION: *Mild pulmonary vascular congestion, similar to the prior study. Electronically Signed   By: Ree Molt M.D.   On: 06/18/2023 13:01   US  Abdomen Limited RUQ (LIVER/GB) Result Date: 06/17/2023 CLINICAL DATA:  Epigastric pain EXAM: ULTRASOUND ABDOMEN LIMITED RIGHT UPPER QUADRANT COMPARISON:  CT 06/17/2023, ultrasound 02/06/2021 FINDINGS: Gallbladder: No gallstones or wall thickening visualized. No sonographic Murphy sign noted by sonographer. Common bile duct: Diameter: 1.6 mm Liver: No focal lesion identified. Within normal limits in parenchymal echogenicity. Portal vein is patent on color Doppler imaging with normal direction of blood flow towards the liver. Possible slight surface nodularity of the liver. Other: Small volume ascites in the right upper quadrant. IMPRESSION: 1. Negative for gallstones or biliary dilatation. 2. Possible slight surface nodularity of the liver, correlate for cirrhosis risk factors. Small volume ascites in  the right upper quadrant. Electronically Signed   By: Luke Bun M.D.   On: 06/17/2023 21:32   CT ABDOMEN PELVIS WO CONTRAST Result Date: 06/17/2023 CLINICAL DATA:  Abdomen pain possible pancreatitis EXAM: CT ABDOMEN AND PELVIS WITHOUT CONTRAST TECHNIQUE: Multidetector CT imaging of the abdomen and pelvis was performed following the standard protocol without IV contrast. RADIATION DOSE REDUCTION: This exam was performed according to the departmental dose-optimization program which  includes automated exposure control, adjustment of the mA and/or kV according to patient size and/or use of iterative reconstruction technique. COMPARISON:  MRI 05/27/2023, CT 05/26/2023, 03/10/2023 FINDINGS: Lower chest: Lung bases demonstrate chronic reticulation and fibrosis. No acute airspace disease. Cardiomegaly Hepatobiliary: No focal liver abnormality is seen. No gallstones, gallbladder wall thickening, or biliary dilatation. Pancreas: Mild pancreatic ductal dilatation measuring up to 5 mm again noted. No definite acute peripancreatic inflammatory change. Spleen: Normal in size without focal abnormality. Adrenals/Urinary Tract: Adrenal glands are thickened bilaterally but without dominant mass. Atrophic kidneys without hydronephrosis. The bladder is nearly empty Stomach/Bowel: The stomach is nonenlarged. Possible diffuse gastric wall thickening. There is no dilated small bowel. Linear density within the right colon, may reflect hemo static clip. Negative appendix. No acute bowel wall thickening Vascular/Lymphatic: Advanced aortic atherosclerosis. No aneurysm. No suspicious lymph node Reproductive: Hysterectomy.  No adnexal mass Other: No free air. Generalized subcutaneous edema. Small volume free fluid adjacent to the liver. Musculoskeletal: No acute or suspicious osseous abnormality. IMPRESSION: 1. No CT evidence for acute peripancreatic inflammatory change. Mild pancreatic ductal dilatation again noted, reference MRCP 05/27/2023 2. Decompressed stomach with suspected diffuse chronic gastric wall thickening. 3. Generalized subcutaneous edema and small volume free fluid adjacent to the liver. 4. Cardiomegaly. 5. Atrophic kidneys. 6. Aortic atherosclerosis. Aortic Atherosclerosis (ICD10-I70.0). Electronically Signed   By: Luke Bun M.D.   On: 06/17/2023 21:29   Medications:   amLODipine   10 mg Oral Daily   Chlorhexidine  Gluconate Cloth  6 each Topical Q0600   doxercalciferol   6 mcg Intravenous Q  T,Th,Sa-HD   [START ON 06/22/2023] doxercalciferol   6 mcg Intravenous Q M,W,F-HD   guaiFENesin   600 mg Oral BID   hydrALAZINE   75 mg Oral Q8H   ipratropium-albuterol   3 mL Nebulization Q6H   lipase/protease/amylase  48,000 Units Oral BID WC   methylPREDNISolone  (SOLU-MEDROL ) injection  40 mg Intravenous Q12H   pantoprazole  (PROTONIX ) IV  40 mg Intravenous Q24H   [START ON 06/20/2023] predniSONE   40 mg Oral Q breakfast   sevelamer  carbonate  800 mg Oral TID WC   sodium zirconium cyclosilicate   10 g Oral Daily    Dialysis Orders: [facility] M[facility]. 3.5 hr, 300/500, 46kg, 2K/2.5Ca bath, AVF, no heparin .  - Last HD 1/6 post weight 46.2kg - Hectoral 6 mcg - no ESA  Assessment/Plan: Abdominal pain - hx of acute/chronic pancreatitis. GI following.  AHRF - on 2L O2 [place] with wet non-purulent cough. CXR with mild pulmonary congestion.  ESRD: HD MWF. Missed HD Wednesday due to transportation issues. Got HD Thursday, plan to get again Saturday and resume MWF schedule next week.  HTN/volume: BP elevated, continue norvasc  10 / hydralazine  75 TID. UF as tolerated tomorrow. 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 Will follow.  Secondary HPTH: Ca low, phos high. Cont sevelemar with meals and IV VDRA.  Nutrition: Alb low, will start supp.   Signe Sick, PA-S Izetta Boehringer, PA-C 06/19/2023, 10:32 AM  [facility]

## 2023-06-19 NOTE — Progress Notes (Signed)
 Pt receives out-pt HD at [facility] on MWF. Will assist as needed.   Olivia Canter Renal Navigator [phone]

## 2023-06-20 DIAGNOSIS — R1010 Upper abdominal pain, unspecified: Secondary | ICD-10-CM | POA: Diagnosis not present

## 2023-06-20 MED ORDER — GABAPENTIN 100 MG PO CAPS: 200.0000 mg | ORAL_CAPSULE | Freq: Every day | ORAL | Status: AC

## 2023-06-20 MED ORDER — PANTOPRAZOLE SODIUM 40 MG PO TBEC: 40.0000 mg | DELAYED_RELEASE_TABLET | Freq: Every day | ORAL | 0 refills | Status: DC

## 2023-06-20 MED ORDER — GUAIFENESIN ER 600 MG PO TB12: 600.0000 mg | ORAL_TABLET | Freq: Two times a day (BID) | ORAL | 0 refills | Status: AC

## 2023-06-20 MED ORDER — IPRATROPIUM-ALBUTEROL 0.5-2.5 (3) MG/3ML IN SOLN
3.0000 mL | Freq: Three times a day (TID) | RESPIRATORY_TRACT | Status: DC
  Administered 2023-06-20: 3 mL via RESPIRATORY_TRACT
  Filled 2023-06-20: qty 3

## 2023-06-20 MED ORDER — IPRATROPIUM-ALBUTEROL 0.5-2.5 (3) MG/3ML IN SOLN: 3.0000 mL | Freq: Three times a day (TID) | RESPIRATORY_TRACT | 1 refills | Status: AC

## 2023-06-20 MED ORDER — PREDNISONE 20 MG PO TABS: 40.0000 mg | ORAL_TABLET | Freq: Every day | ORAL | 0 refills | Status: AC

## 2023-06-20 MED ORDER — ALBUTEROL SULFATE HFA 108 (90 BASE) MCG/ACT IN AERS
2.0000 | INHALATION_SPRAY | Freq: Four times a day (QID) | RESPIRATORY_TRACT | 2 refills | Status: AC | PRN
Start: 2023-06-20 — End: ?

## 2023-06-20 MED ORDER — PANTOPRAZOLE SODIUM 40 MG PO TBEC: 40.0000 mg | DELAYED_RELEASE_TABLET | Freq: Every day | ORAL | Status: DC

## 2023-06-20 NOTE — Progress Notes (Signed)
   06/20/23 1556  Vitals  Temp (!) 97.3 F (36.3 C)  Pulse Rate 96  Resp 19  BP (!) 166/64  SpO2 97 %  O2 Device Room Air  Oxygen Therapy  Patient Activity (if Appropriate) In bed  Pulse Oximetry Type Continuous  Post Treatment  Dialyzer Clearance Lightly streaked  Liters Processed 63  Fluid Removed (mL) 1000 mL  Tolerated HD Treatment Yes   Received patient in bed to unit.  Alert and oriented.  Informed consent signed and in chart.   TX duration:3.0 hour  Patient tolerated well.  Transported back to the room  Alert, without acute distress.  Hand-off given to patient's nurse.   Access used: R AVF Access issues: None  Total UF removed: Medication(s) given: See MAR  Geni Seip, RN [facility]

## 2023-06-20 NOTE — Discharge Planning (Signed)
 [facility] Patient Discharge Orders- [facility]: [facility]   Patient's name: Ashley [name] Admit/DC Dates: 06/17/2023 - 06/20/23  Discharge Diagnoses: Abdominal pain Acute hypoxic respiratory failure    Aranesp : Given: no   Last Hgb: 10.9 PRBC's Given: no ESA dose for discharge: none IV Iron  dose at discharge: none  Heparin  change: no   EDW Change: no   Bath Change: no  Access intervention/Change: no Details:  Hectorol /Calcitriol  change: no  Discharge Labs: Calcium  7.7 Phosphorus 6 Albumin  3.1 K+ 4.3  IV Antibiotics: none Details:  On Coumadin?: no Last INR: Next INR: Managed By:   OTHER/APPTS/LAB ORDERS:    D/C Meds to be reconciled by nurse after every discharge.  Completed By: Manuelita Labella, PA-C   Reviewed by: MD:______ RN_______

## 2023-06-20 NOTE — Discharge Summary (Addendum)
 Physician Discharge Summary  Ashley [name] FMW:[id number] DOB: 1951-02-22 DOA: 06/17/2023  PCP: Claudene Prentice Ashley Mickey., FNP  Admit date: 06/17/2023 Discharge date: 06/20/2023  Admitted From: Home Disposition:  Home  Discharge Condition:Stable CODE STATUS:FULL Diet recommendation: Renal  Brief/Interim Summary: Patient is a 74 female with history of chronic pancreatitis, COPD, ESRD on dialysis, hypertension, recently admitted for acute on chronic pancreatitis who presented with abdominal pain, diarrhea.  CT abdomen/pelvis did not show any acute peripancreatic inflammation but possible gastric wall thickening.  Ultrasound of the abdomen showed cirrhotic changes.  Lipase of 73.  GI consulted .  Abdominal pain, diarrhea resolved during this hospitalization.  No plan for further workup.  Found to be severely wheezing today and hypoxic on 1/10, significantly improved with steroids.  Currently on room air.  Medically stable for discharge to home today.  Following problems were addressed during the hospitalization:  Abdominal pain/concern for acute on chronic pancreatitis/diarrhea: Initially presented with abdominal pain, diarrhea.  The symptoms have improved.  Diarrhea has completely stopped.  Abdomen is mostly benign on examination.  Lipase of 73.  CT abdomen/pelvis shows no acute inflammation but possible gastric wall thickening.    Ultrasound of the abdomen showed cirrhotic changes.  GI was consulted and following.  No plan for further workup.  Patient has outpatient appointment with [facility], plan for outpatient endoscopy ultrasound in March.  Continue Creon  on discharge.   Acute hypoxic respiratory failure/COPD exacerbation: Found to be wheezing on 1/10.  Not on oxygen at home.  Started on steroids.  Weaned to room air.  Continue prednisone    Smoker: Smoked 3 cigarettes.Counseled cessation   ESRD on dialysis: Nephrology was following for dialysis.  Hyperkalemia resolved   GERD: Continue Protonix     Hypertension: Continue amlodipine , hydralazine   Discharge Diagnoses:  Principal Problem:   Abdominal pain Active Problems:   ESRD on hemodialysis (HCC)   Acute on chronic pancreatitis (HCC)   Hypertensive urgency   Nausea vomiting and diarrhea    Discharge Instructions  Discharge Instructions     Ambulatory referral to Pulmonology   Complete by: As directed    COPD   Reason for referral: Interstitial Lung Disease(ILD)   Diet - low sodium heart healthy   Complete by: As directed    Renal   Discharge instructions   Complete by: As directed    1)Please take prescribed medications as instructed 2)Follow up with your PCP in a week 3)Stop smoking 4)Follow up with pulmonology as an outpatient for outpatient pulmonary function test   Increase activity slowly   Complete by: As directed       Allergies as of 06/20/2023       Reactions   Aspirin  Nausea And Vomiting   Stomach ache   Ibuprofen Nausea And Vomiting   Stomach ache        Medication List     TAKE these medications    albuterol  108 (90 Base) MCG/ACT inhaler Commonly known as: VENTOLIN  HFA Inhale 2 puffs into the lungs every 6 (six) hours as needed for wheezing or shortness of breath.   amLODipine  10 MG tablet Commonly known as: NORVASC  Take 1 tablet (10 mg total) by mouth daily. What changed: when to take this   Creon  12000-38000 units Cpep capsule Generic drug: lipase/protease/amylase Take 4 capsules by mouth 2 (two) times daily with a meal.   gabapentin  100 MG capsule Commonly known as: NEURONTIN  Take 2 capsules (200 mg total) by mouth at bedtime. What changed: when to  take this   guaiFENesin  600 MG 12 hr tablet Commonly known as: MUCINEX  Take 1 tablet (600 mg total) by mouth 2 (two) times daily for 7 days.   hydrALAZINE  25 MG tablet Commonly known as: APRESOLINE  Take 3 tablets (75 mg total) by mouth every 8 (eight) hours.   ipratropium-albuterol  0.5-2.5 (3) MG/3ML Soln Commonly known  as: DUONEB Take 3 mLs by nebulization 3 (three) times daily.   pantoprazole  40 MG tablet Commonly known as: PROTONIX  Take 1 tablet (40 mg total) by mouth daily.   predniSONE  20 MG tablet Commonly known as: DELTASONE  Take 2 tablets (40 mg total) by mouth daily with breakfast for 3 days. Start taking on: June 21, 2023   sevelamer  carbonate 800 MG tablet Commonly known as: RENVELA  Take 800 mg by mouth 3 (three) times daily with meals.               Durable Medical Equipment  (From admission, onward)           Start     Ordered   06/20/23 1005  For home use only DME Nebulizer machine  Once       Question Answer Comment  Patient needs a nebulizer to treat with the following condition COPD exacerbation (HCC)   Length of Need Lifetime   Additional equipment included Administration kit   Additional equipment included Filter      06/20/23 1004            Allergies  Allergen Reactions   Aspirin  Nausea And Vomiting    Stomach ache   Ibuprofen Nausea And Vomiting    Stomach ache    Consultations: GI   Procedures/Studies: DG CHEST PORT 1 VIEW Result Date: 06/18/2023 CLINICAL DATA:  Hypoxia.  Upper abdominal pain. EXAM: PORTABLE CHEST 1 VIEW COMPARISON:  05/21/2023. FINDINGS: Mild pulmonary vascular congestion, similar to the prior study. Bilateral lung fields are otherwise clear. No acute consolidation or lung collapse. Bilateral costophrenic angles are clear. Stable cardio-mediastinal silhouette. No acute osseous abnormalities. The soft tissues are within normal limits. Left proximal arm vascular stent again seen. IMPRESSION: *Mild pulmonary vascular congestion, similar to the prior study. Electronically Signed   By: Ree Molt M.D.   On: 06/18/2023 13:01   US  Abdomen Limited RUQ (LIVER/GB) Result Date: 06/17/2023 CLINICAL DATA:  Epigastric pain EXAM: ULTRASOUND ABDOMEN LIMITED RIGHT UPPER QUADRANT COMPARISON:  CT 06/17/2023, ultrasound 02/06/2021 FINDINGS:  Gallbladder: No gallstones or wall thickening visualized. No sonographic Murphy sign noted by sonographer. Common bile duct: Diameter: 1.6 mm Liver: No focal lesion identified. Within normal limits in parenchymal echogenicity. Portal vein is patent on color Doppler imaging with normal direction of blood flow towards the liver. Possible slight surface nodularity of the liver. Other: Small volume ascites in the right upper quadrant. IMPRESSION: 1. Negative for gallstones or biliary dilatation. 2. Possible slight surface nodularity of the liver, correlate for cirrhosis risk factors. Small volume ascites in the right upper quadrant. Electronically Signed   By: Luke Bun M.D.   On: 06/17/2023 21:32   CT ABDOMEN PELVIS WO CONTRAST Result Date: 06/17/2023 CLINICAL DATA:  Abdomen pain possible pancreatitis EXAM: CT ABDOMEN AND PELVIS WITHOUT CONTRAST TECHNIQUE: Multidetector CT imaging of the abdomen and pelvis was performed following the standard protocol without IV contrast. RADIATION DOSE REDUCTION: This exam was performed according to the departmental dose-optimization program which includes automated exposure control, adjustment of the mA and/or kV according to patient size and/or use of iterative reconstruction technique. COMPARISON:  MRI  05/27/2023, CT 05/26/2023, 03/10/2023 FINDINGS: Lower chest: Lung bases demonstrate chronic reticulation and fibrosis. No acute airspace disease. Cardiomegaly Hepatobiliary: No focal liver abnormality is seen. No gallstones, gallbladder wall thickening, or biliary dilatation. Pancreas: Mild pancreatic ductal dilatation measuring up to 5 mm again noted. No definite acute peripancreatic inflammatory change. Spleen: Normal in size without focal abnormality. Adrenals/Urinary Tract: Adrenal glands are thickened bilaterally but without dominant mass. Atrophic kidneys without hydronephrosis. The bladder is nearly empty Stomach/Bowel: The stomach is nonenlarged. Possible diffuse  gastric wall thickening. There is no dilated small bowel. Linear density within the right colon, may reflect hemo static clip. Negative appendix. No acute bowel wall thickening Vascular/Lymphatic: Advanced aortic atherosclerosis. No aneurysm. No suspicious lymph node Reproductive: Hysterectomy.  No adnexal mass Other: No free air. Generalized subcutaneous edema. Small volume free fluid adjacent to the liver. Musculoskeletal: No acute or suspicious osseous abnormality. IMPRESSION: 1. No CT evidence for acute peripancreatic inflammatory change. Mild pancreatic ductal dilatation again noted, reference MRCP 05/27/2023 2. Decompressed stomach with suspected diffuse chronic gastric wall thickening. 3. Generalized subcutaneous edema and small volume free fluid adjacent to the liver. 4. Cardiomegaly. 5. Atrophic kidneys. 6. Aortic atherosclerosis. Aortic Atherosclerosis (ICD10-I70.0). Electronically Signed   By: Luke Bun M.D.   On: 06/17/2023 21:29   MR 3D Recon At Scanner Result Date: 05/28/2023 CLINICAL DATA:  Pancreatitis. Main pancreatic ductal dilatation on CT imaging. EXAM: MRI ABDOMEN WITHOUT CONTRAST  (INCLUDING MRCP) TECHNIQUE: Multiplanar multisequence MR imaging of the abdomen was performed. Heavily T2-weighted images of the biliary and pancreatic ducts were obtained, and three-dimensional MRCP images were rendered by post processing. COMPARISON:  CT scan 05/26/2023 FINDINGS: Lower chest: Atelectasis noted dependent left lung base with possible tiny left pleural effusion. Hepatobiliary: No focal abnormality identified in the liver on this study performed without intravenous contrast material. Diffuse low signal intensity in liver parenchyma on T2 weighted imaging suggests iron  deposition. Tiny stones are seen in the gallbladder. No intrahepatic biliary duct dilatation. Common duct measures approximately 5 mm diameter. Common bile duct measures maximally at 6 mm diameter. No findings to suggest  choledocholithiasis. Pancreas: As noted on CT imaging, there is diffuse distention of the main pancreatic duct through the body and tail of pancreas measuring up to 7 mm diameter in the pancreatic body. There is relatively rapid tapering of the main duct in the head of the pancreas with only a string sign of duct visible (image 22/3). Some reconstitution of the main duct into the ampulla is evident although duct in this region is nondilated. Assessment of the pancreas is limited by motion artifact and lack of intravenous contrast. Additionally, there are surgical clips and potentially a hemostatic clip in the antral region of the stomach generating substantial susceptibility artifact obscuring portions of the pancreatic head on some pulse sequences. Within this limitation, the does appears to be some loss of parenchymal architecture in the head of the pancreas in the region of pancreatic ductal narrowing (see axial image 25 of series 3). Spleen: Some diffuse low signal intensity in the splenic parenchyma on T2 imaging suggests iron  deposition. Tiny anterior T2 hyperintensity may be a cyst or pseudocyst. Adrenals/Urinary Tract: Thickening of both adrenal glands evident without a discrete nodule or mass. Kidneys are atrophic with innumerable tiny cortical T2 hyperintensities, too small to characterize but likely benign and probably cysts. 17 mm cyst noted lower interpolar right kidney with layering calcific or proteinaceous debris. No hydronephrosis on today's study. Stomach/Bowel: Stomach is decompressed with diffuse wall  thickening, likely accentuated by the decompressed state. No evidence for small bowel obstruction. Vascular/Lymphatic: No abdominal aortic aneurysm. No abdominal lymphadenopathy. Other:  No substantial intraperitoneal free fluid. Musculoskeletal: No suspicious focal marrow signal abnormality. IMPRESSION: 1. Diffuse distention of the main pancreatic duct through the body and tail of pancreas  measuring up to 7 mm diameter in the pancreatic body. There is relatively rapid tapering of the main duct in the head of the pancreas with only a string sign of duct visible. Some reconstitution of the main duct into the ampulla is evident although duct in this region is nondilated. Assessment of the pancreas is limited by motion artifact and lack of intravenous contrast. Additionally, there are surgical clips and potentially a hemostatic clip in the antral region of the stomach generating substantial susceptibility artifact obscuring portions of the pancreatic head on some pulse sequences. Within this limitation, there does appears to be some loss of parenchymal architecture in the head of the pancreas in the region of pancreatic ductal narrowing. As such, pancreatic lesion/neoplasm cannot be excluded. Consider endoscopic ultrasound to further evaluate. 2. Cholelithiasis. No intrahepatic or extrahepatic biliary duct dilatation. No findings to suggest choledocholithiasis. 3. Diffuse low signal intensity in liver parenchyma on T2 weighted imaging suggests iron  deposition. 4. Atrophic kidneys with innumerable tiny cortical T2 hyperintensities, too small to characterize but likely benign and probably cysts. 5. Atelectasis dependent left lung base with possible tiny left pleural effusion. Electronically Signed   By: Camellia Candle M.D.   On: 05/28/2023 08:41   MR ABDOMEN MRCP WO CONTRAST Result Date: 05/28/2023 CLINICAL DATA:  Pancreatitis. Main pancreatic ductal dilatation on CT imaging. EXAM: MRI ABDOMEN WITHOUT CONTRAST  (INCLUDING MRCP) TECHNIQUE: Multiplanar multisequence MR imaging of the abdomen was performed. Heavily T2-weighted images of the biliary and pancreatic ducts were obtained, and three-dimensional MRCP images were rendered by post processing. COMPARISON:  CT scan 05/26/2023 FINDINGS: Lower chest: Atelectasis noted dependent left lung base with possible tiny left pleural effusion. Hepatobiliary: No  focal abnormality identified in the liver on this study performed without intravenous contrast material. Diffuse low signal intensity in liver parenchyma on T2 weighted imaging suggests iron  deposition. Tiny stones are seen in the gallbladder. No intrahepatic biliary duct dilatation. Common duct measures approximately 5 mm diameter. Common bile duct measures maximally at 6 mm diameter. No findings to suggest choledocholithiasis. Pancreas: As noted on CT imaging, there is diffuse distention of the main pancreatic duct through the body and tail of pancreas measuring up to 7 mm diameter in the pancreatic body. There is relatively rapid tapering of the main duct in the head of the pancreas with only a string sign of duct visible (image 22/3). Some reconstitution of the main duct into the ampulla is evident although duct in this region is nondilated. Assessment of the pancreas is limited by motion artifact and lack of intravenous contrast. Additionally, there are surgical clips and potentially a hemostatic clip in the antral region of the stomach generating substantial susceptibility artifact obscuring portions of the pancreatic head on some pulse sequences. Within this limitation, the does appears to be some loss of parenchymal architecture in the head of the pancreas in the region of pancreatic ductal narrowing (see axial image 25 of series 3). Spleen: Some diffuse low signal intensity in the splenic parenchyma on T2 imaging suggests iron  deposition. Tiny anterior T2 hyperintensity may be a cyst or pseudocyst. Adrenals/Urinary Tract: Thickening of both adrenal glands evident without a discrete nodule or mass. Kidneys are atrophic  with innumerable tiny cortical T2 hyperintensities, too small to characterize but likely benign and probably cysts. 17 mm cyst noted lower interpolar right kidney with layering calcific or proteinaceous debris. No hydronephrosis on today's study. Stomach/Bowel: Stomach is decompressed with  diffuse wall thickening, likely accentuated by the decompressed state. No evidence for small bowel obstruction. Vascular/Lymphatic: No abdominal aortic aneurysm. No abdominal lymphadenopathy. Other:  No substantial intraperitoneal free fluid. Musculoskeletal: No suspicious focal marrow signal abnormality. IMPRESSION: 1. Diffuse distention of the main pancreatic duct through the body and tail of pancreas measuring up to 7 mm diameter in the pancreatic body. There is relatively rapid tapering of the main duct in the head of the pancreas with only a string sign of duct visible. Some reconstitution of the main duct into the ampulla is evident although duct in this region is nondilated. Assessment of the pancreas is limited by motion artifact and lack of intravenous contrast. Additionally, there are surgical clips and potentially a hemostatic clip in the antral region of the stomach generating substantial susceptibility artifact obscuring portions of the pancreatic head on some pulse sequences. Within this limitation, there does appears to be some loss of parenchymal architecture in the head of the pancreas in the region of pancreatic ductal narrowing. As such, pancreatic lesion/neoplasm cannot be excluded. Consider endoscopic ultrasound to further evaluate. 2. Cholelithiasis. No intrahepatic or extrahepatic biliary duct dilatation. No findings to suggest choledocholithiasis. 3. Diffuse low signal intensity in liver parenchyma on T2 weighted imaging suggests iron  deposition. 4. Atrophic kidneys with innumerable tiny cortical T2 hyperintensities, too small to characterize but likely benign and probably cysts. 5. Atelectasis dependent left lung base with possible tiny left pleural effusion. Electronically Signed   By: Camellia Candle M.D.   On: 05/28/2023 08:41   CT ABDOMEN PELVIS W CONTRAST Result Date: 05/26/2023 CLINICAL DATA:  Progressive abdominal pain. EXAM: CT ABDOMEN AND PELVIS WITH CONTRAST TECHNIQUE:  Multidetector CT imaging of the abdomen and pelvis was performed using the standard protocol following bolus administration of intravenous contrast. RADIATION DOSE REDUCTION: This exam was performed according to the departmental dose-optimization program which includes automated exposure control, adjustment of the mA and/or kV according to patient size and/or use of iterative reconstruction technique. CONTRAST:  75mL OMNIPAQUE  IOHEXOL  350 MG/ML SOLN COMPARISON:  CT of the abdomen and pelvis without contrast on 05/20/2023. FINDINGS: Lower chest: Chronic lung disease, bibasilar scarring and mild left basilar atelectasis. Hepatobiliary: No focal liver abnormality is seen. Contracted gallbladder. No biliary dilatation. Pancreas: There is some diffuse dilatation of the pancreatic duct reaching maximal caliber 7 mm in the neck/head of the pancreas. Some dilatation was present on the 12/11 study and less apparent on the 12/07 study. There does not appear to be an obstructing lesion or significant surrounding inflammation. Correlation suggested with lipase level. No pancreatic ductal calculi or parenchymal calcifications identified. Spleen: Normal in size without focal abnormality. Adrenals/Urinary Tract: Stable hyperplasia of both adrenal glands without focal lesion. Stable atrophic kidneys bilaterally likely reflecting end-stage renal disease. No hydronephrosis or solid renal masses. The bladder is decompressed. Stomach/Bowel: Stable appearance of endoscopic hemostatic clip in the region of the gastric antrum. No evidence of bowel obstruction, ileus, free intraperitoneal air or visible focal lesion. Moderate fecal material in the distal half of the colon without significant focal fecal impaction. Vascular/Lymphatic: Advanced atherosclerosis of the abdominal aorta and iliac arteries without aneurysm. Reproductive: Status post hysterectomy. No adnexal masses. Other: No abdominal wall hernia or abnormality. No abdominopelvic  ascites. Musculoskeletal: No  acute or significant osseous findings. IMPRESSION: 1. Diffuse dilatation of the pancreatic duct reaching maximal caliber 7 mm in the neck/head of the pancreas. Some dilatation was present on the 12/11 study and less apparent on the 12/07 study. There does not appear to be an obstructing lesion or significant surrounding inflammation. Correlation suggested with lipase level. 2. Stable atrophic kidneys bilaterally likely reflecting end-stage renal disease. 3. Stable hyperplasia of both adrenal glands without focal lesion. 4. Advanced atherosclerosis of the abdominal aorta and iliac arteries without aneurysm. Aortic Atherosclerosis (ICD10-I70.0). Electronically Signed   By: Marcey Moan M.D.   On: 05/26/2023 12:34      Subjective:  Patient seen and examined at bedside today.  Room air today.  Denies any shortness of breath or wheezing today.  Has some cough.  Feels ready to go home.  Denies any abdomen pain, nausea or vomiting or diarrhea  Discharge Exam: Vitals:   06/19/23 2124 06/20/23 0710  BP: 131/85 (!) 155/68  Pulse: 81 78  Resp:    Temp: 98.6 F (37 C) 98.4 F (36.9 C)  SpO2: 98% 98%   Vitals:   06/19/23 1646 06/19/23 2121 06/19/23 2124 06/20/23 0710  BP: (!) 147/79 131/85 131/85 (!) 155/68  Pulse: 76  81 78  Resp:      Temp: 98 F (36.7 C)  98.6 F (37 C) 98.4 F (36.9 C)  TempSrc: Oral  Oral Oral  SpO2: 98%  98% 98%  Weight:        General: Pt is alert, awake, not in acute distress Cardiovascular: RRR, S1/S2 +, no rubs, no gallops Respiratory: CTA bilaterally, no wheezing, no rhonchi Abdominal: Soft, NT, ND, bowel sounds + Extremities: no edema, no cyanosis    The results of significant diagnostics from this hospitalization (including imaging, microbiology, ancillary and laboratory) are listed below for reference.     Microbiology: Recent Results (from the past 240 hours)  MRSA Next Gen by PCR, Nasal     Status: None   Collection  Time: 06/19/23  7:00 AM  Result Value Ref Range Status   MRSA by PCR Next Gen NOT DETECTED NOT DETECTED Final    Comment: (NOTE) The GeneXpert MRSA Assay (FDA approved for NASAL specimens only), is one component of a comprehensive MRSA colonization surveillance program. It is not intended to diagnose MRSA infection nor to guide or monitor treatment for MRSA infections. Test performance is not FDA approved in patients less than 88 years old. Performed at [facility], [address] [place], [place] [address]      Labs: BNP (last 3 results) No results for input(s): BNP in the last 8760 hours. Basic Metabolic Panel: Recent Labs  Lab 06/17/23 1648 06/18/23 0321 06/18/23 0740 06/19/23 0835  NA 140 138 140 137  K 5.4* 5.9* 6.3* 4.3  CL 98 101 100 95*  CO2 26 24 24 28   GLUCOSE 94 85 77 85  BUN 31* 32* 38* 17  CREATININE 9.12* 10.06* 10.01* 7.06*  CALCIUM  7.3* 7.3* 7.5* 7.7*  MG 1.8  --   --   --   PHOS  --   --  6.0*  --    Liver Function Tests: Recent Labs  Lab 06/17/23 1648 06/18/23 0321 06/18/23 0740  AST 16 15  --   ALT 9 9  --   ALKPHOS 76 67  --   BILITOT 0.7 0.6  --   PROT 7.2 6.4*  --   ALBUMIN  3.4* 3.1* 3.1*  Recent Labs  Lab 06/17/23 1648  LIPASE 73*   No results for input(s): AMMONIA in the last 168 hours. CBC: Recent Labs  Lab 06/17/23 1540 06/18/23 0321 06/18/23 0740  WBC 8.0 7.3 6.8  HGB 13.1 10.7* 10.9*  HCT 40.7 34.4* 35.3*  MCV 88.7 88.7 90.5  PLT 229 202 223   Cardiac Enzymes: No results for input(s): CKTOTAL, CKMB, CKMBINDEX, TROPONINI in the last 168 hours. BNP: Invalid input(s): POCBNP CBG: Recent Labs  Lab 06/18/23 2059 06/18/23 2230 06/19/23 0416 06/19/23 1125 06/19/23 2235  GLUCAP 55* 115* 84 153* 193*   D-Dimer No results for input(s): DDIMER in the last 72 hours. Hgb A1c No results for input(s): HGBA1C in the last 72 hours. Lipid Profile No results for input(s): CHOL, HDL, LDLCALC,  TRIG, CHOLHDL, LDLDIRECT in the last 72 hours. Thyroid  function studies No results for input(s): TSH, T4TOTAL, T3FREE, THYROIDAB in the last 72 hours.  Invalid input(s): FREET3 Anemia work up No results for input(s): VITAMINB12, FOLATE, FERRITIN, TIBC, IRON , RETICCTPCT in the last 72 hours. Urinalysis    Component Value Date/Time   COLORURINE YELLOW 12/31/2018 1014   APPEARANCEUR CLEAR 12/31/2018 1014   LABSPEC 1.008 12/31/2018 1014   PHURINE 9.0 (H) 12/31/2018 1014   GLUCOSEU NEGATIVE 12/31/2018 1014   HGBUR SMALL (A) 12/31/2018 1014   BILIRUBINUR NEGATIVE 12/31/2018 1014   BILIRUBINUR NEG 01/29/2016 1142   KETONESUR NEGATIVE 12/31/2018 1014   PROTEINUR >=300 (A) 12/31/2018 1014   UROBILINOGEN 0.2 01/29/2016 1142   UROBILINOGEN 0.2 03/21/2015 1649   NITRITE NEGATIVE 12/31/2018 1014   LEUKOCYTESUR NEGATIVE 12/31/2018 1014   Sepsis Labs Recent Labs  Lab 06/17/23 1540 06/18/23 0321 06/18/23 0740  WBC 8.0 7.3 6.8   Microbiology Recent Results (from the past 240 hours)  MRSA Next Gen by PCR, Nasal     Status: None   Collection Time: 06/19/23  7:00 AM  Result Value Ref Range Status   MRSA by PCR Next Gen NOT DETECTED NOT DETECTED Final    Comment: (NOTE) The GeneXpert MRSA Assay (FDA approved for NASAL specimens only), is one component of a comprehensive MRSA colonization surveillance program. It is not intended to diagnose MRSA infection nor to guide or monitor treatment for MRSA infections. Test performance is not FDA approved in patients less than 74 years old. Performed at [facility], [address] [place], [place] [address]     Please note: You were cared for by a hospitalist during your hospital stay. Once you are discharged, your primary care physician will handle any further medical issues. Please note that NO REFILLS for any discharge medications will be authorized once you are discharged, as it is imperative that you return  to your primary care physician (or establish a relationship with a primary care physician if you do not have one) for your post hospital discharge needs so that they can reassess your need for medications and monitor your lab values.    Time coordinating discharge: 40 minutes  SIGNED:   Ivonne Mustache, MD  [facility] 06/20/2023, 10:26 AM Pager [phone]  If 7PM-7AM, please contact night-coverage [email or web address] Password [email or web address]

## 2023-06-20 NOTE — TOC Transition Note (Signed)
 Transition of Care [facility]) - Discharge Note   Patient Details  Name: Ashley [name] MRN: [id number] Date of Birth: [birth year]  Transition of Care [facility] CM/SW Contact:  Robynn Eileen Hoose, RN Phone Number: 06/20/2023, 10:16 AM   Clinical Narrative:   Secure chat received from provider regarding patient needing nebulizer machine. Spoke with patient, no preference for DME company. Nebulizer ordered through [place] with [employer] to be delivered at bedside before discharge.    Final next level of care: Home/Self Care Barriers to Discharge: No Barriers Identified   Patient Goals and CMS Choice            Discharge Placement                       Discharge Plan and Services Additional resources added to the After Visit Summary for                  DME Arranged: Nebulizer machine DME Agency: [employer] Date DME Agency Contacted: 06/20/23 Time DME Agency Contacted: 1010 Representative spoke with at DME Agency: [place]            Social Drivers of Health (SDOH) Interventions SDOH Screenings   Food Insecurity: No Food Insecurity (06/19/2023)  Housing: Low Risk  (06/19/2023)  Transportation Needs: Unmet Transportation Needs (06/19/2023)  Utilities: Not At Risk (06/19/2023)  Depression (PHQ2-9): Low Risk  (04/06/2019)  Recent Concern: Depression (PHQ2-9) - Medium Risk (04/03/2019)  Social Connections: Moderately Integrated (06/19/2023)  Tobacco Use: High Risk (06/18/2023)     Readmission Risk Interventions    03/10/2023    2:46 PM  Readmission Risk Prevention Plan  Transportation Screening Complete  PCP or Specialist Appt within 3-5 Days Complete  HRI or Home Care Consult Complete  Palliative Care Screening Not Applicable  Medication Review (RN Care Manager) Referral to Pharmacy

## 2023-06-20 NOTE — Progress Notes (Addendum)
 [facility] Progress Note   Subjective:   Patient seen and examined at bedside.  States she is feeling better.  Breathing improved, especially after nebulizer this AM.  Denies CP, SOB, and n/v/d.  Admits to abdominal cramping but overall improved.  Plan for discharge today after dialysis.   Objective Vitals:   06/19/23 1646 06/19/23 2121 06/19/23 2124 06/20/23 0710  BP: (!) 147/79 131/85 131/85 (!) 155/68  Pulse: 76  81 78  Resp:      Temp: 98 F (36.7 C)  98.6 F (37 C) 98.4 F (36.9 C)  TempSrc: Oral  Oral Oral  SpO2: 98%  98% 98%  Weight:       Physical Exam General:chronically ill appearing female in NAD Heart:RRR, +3/6 systolic murmur Lungs:+crackles in LLL, +rhonchi scattered on R Abdomen:soft, NTND Extremities:no LE edema Dialysis Access: LU AVF +b/t   Filed Weights   06/18/23 1437  Weight: 46 kg    Intake/Output Summary (Last 24 hours) at 06/20/2023 1203 Last data filed at 06/20/2023 0400 Gross per 24 hour  Intake 660 ml  Output --  Net 660 ml    Additional Objective Labs: Basic Metabolic Panel: Recent Labs  Lab 06/18/23 0321 06/18/23 0740 06/19/23 0835  NA 138 140 137  K 5.9* 6.3* 4.3  CL 101 100 95*  CO2 24 24 28   GLUCOSE 85 77 85  BUN 32* 38* 17  CREATININE 10.06* 10.01* 7.06*  CALCIUM  7.3* 7.5* 7.7*  PHOS  --  6.0*  --    Liver Function Tests: Recent Labs  Lab 06/17/23 1648 06/18/23 0321 06/18/23 0740  AST 16 15  --   ALT 9 9  --   ALKPHOS 76 67  --   BILITOT 0.7 0.6  --   PROT 7.2 6.4*  --   ALBUMIN  3.4* 3.1* 3.1*   Recent Labs  Lab 06/17/23 1648  LIPASE 73*   CBC: Recent Labs  Lab 06/17/23 1540 06/18/23 0321 06/18/23 0740  WBC 8.0 7.3 6.8  HGB 13.1 10.7* 10.9*  HCT 40.7 34.4* 35.3*  MCV 88.7 88.7 90.5  PLT 229 202 223   Blood Culture    Component Value Date/Time   SDES URINE, RANDOM 06/20/2017 2222   SPECREQUEST NONE 06/20/2017 2222   CULT MULTIPLE SPECIES PRESENT, SUGGEST RECOLLECTION (A)  06/20/2017 2222   REPTSTATUS 06/22/2017 FINAL 06/20/2017 2222   CBG: Recent Labs  Lab 06/18/23 2059 06/18/23 2230 06/19/23 0416 06/19/23 1125 06/19/23 2235  GLUCAP 55* 115* 84 153* 193*   DG CHEST PORT 1 VIEW Result Date: 06/18/2023 CLINICAL DATA:  Hypoxia.  Upper abdominal pain. EXAM: PORTABLE CHEST 1 VIEW COMPARISON:  05/21/2023. FINDINGS: Mild pulmonary vascular congestion, similar to the prior study. Bilateral lung fields are otherwise clear. No acute consolidation or lung collapse. Bilateral costophrenic angles are clear. Stable cardio-mediastinal silhouette. No acute osseous abnormalities. The soft tissues are within normal limits. Left proximal arm vascular stent again seen. IMPRESSION: *Mild pulmonary vascular congestion, similar to the prior study. Electronically Signed   By: Ree Molt M.D.   On: 06/18/2023 13:01    Medications:   amLODipine   10 mg Oral Daily   Chlorhexidine  Gluconate Cloth  6 each Topical Q0600   doxercalciferol   6 mcg Intravenous Q T,Th,Sa-HD   [START ON 06/22/2023] doxercalciferol   6 mcg Intravenous Q M,W,F-HD   guaiFENesin   600 mg Oral BID   hydrALAZINE   75 mg Oral Q8H   ipratropium-albuterol   3 mL Nebulization TID   lipase/protease/amylase  48,000 Units Oral BID WC   multivitamin  1 tablet Oral QHS   [START ON 06/21/2023] pantoprazole   40 mg Oral Daily   predniSONE   40 mg Oral Q breakfast   sevelamer  carbonate  800 mg Oral TID WC    [facility] Orders: [facility]. 3.5 hr, 300/500, 46kg, 2K/2.5Ca bath, AVF, no heparin .  - Last HD 1/6 post weight 46.2kg - Hectoral 6 mcg - no ESA   Assessment/Plan: Abdominal pain - hx of acute/chronic pancreatitis. Ongoing cramping, but overall better.  GI following.  AHRF - on 2L O2 [place] with wet non-purulent cough. CXR with mild pulmonary congestion. Steroids started.   ESRD: HD MWF. Off schedule this week, last HD Thursday 1/9.  Plan for HD today and resume regular schedule on Monday. HTN/volume: BP elevated,  continue norvasc  10 / hydralazine  75 TID. UF as tolerated. 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 Will follow.  Secondary HPTH: Ca low, phos high. Cont sevelemar with meals and IV VDRA.  Nutrition: Alb low. Dispo - on for discharge after dialysis today.  Manuelita Labella, PA-C [facility] 06/20/2023,12:03 PM  LOS: 2 days   Pt seen, examined and agree w A/P as above.  Myer Fret MD  [facility] 06/20/2023, 6:08 PM

## 2023-06-20 NOTE — Progress Notes (Signed)
 Pt. Ambulating in hallway with walker without difficulty, O2 sat 95% on R/A, tol. Well, no distress noted  Margarita Grizzle

## 2023-06-20 NOTE — Plan of Care (Signed)

## 2023-06-21 ENCOUNTER — Telehealth: Payer: Self-pay | Admitting: Nephrology

## 2023-06-21 NOTE — Telephone Encounter (Signed)
 Transition of Care Contact from Inpatient Facility   Date of Discharge: 06/20/23 Date of Contact: 06/21/23 Method of contact: phone Talked to patient   Patient contacted to discuss transition of care form recent hospitaliztion. Patient was admitted to [facility] from 06/17/23 to 06/20/23 with the discharge diagnosis of acute on chronic pancreatitis and COPD exacerbation.     Medication changes were reviewed - unable to pick up prednisone  until tomorrow.  Patient will follow up with is outpatient dialysis center 06/22/23.  Other follow up needs include none identified.   Manuelita Labella, PA-C [employer]

## 2023-06-22 NOTE — Progress Notes (Signed)
 Late Note Entry- Jun 22, 2023  Pt was d/c on Saturday. Contacted [facility] this morning to be advised of pt's d/c date and that pt should resume care today.   Olivia Canter Renal Navigator [phone]

## 2023-06-23 LAB — HEPATITIS B SURFACE ANTIBODY, QUANTITATIVE: Hep B S AB Quant (Post): 48.9 m[IU]/mL

## 2023-06-24 ENCOUNTER — Encounter (HOSPITAL_COMMUNITY): Payer: Self-pay

## 2023-06-24 ENCOUNTER — Other Ambulatory Visit: Payer: Self-pay

## 2023-06-24 ENCOUNTER — Emergency Department (HOSPITAL_COMMUNITY)
Admission: EM | Admit: 2023-06-24 | Discharge: 2023-06-24 | Disposition: A | Discharge status: Home / Self Care | Payer: 59 | Attending: Emergency Medicine | Admitting: Emergency Medicine

## 2023-06-24 DIAGNOSIS — T82838A Hemorrhage of vascular prosthetic devices, implants and grafts, initial encounter: Secondary | ICD-10-CM | POA: Insufficient documentation

## 2023-06-24 DIAGNOSIS — Z992 Dependence on renal dialysis: Secondary | ICD-10-CM | POA: Diagnosis not present

## 2023-06-24 DIAGNOSIS — F1210 Cannabis abuse, uncomplicated: Secondary | ICD-10-CM | POA: Diagnosis not present

## 2023-06-24 DIAGNOSIS — I120 Hypertensive chronic kidney disease with stage 5 chronic kidney disease or end stage renal disease: Secondary | ICD-10-CM | POA: Insufficient documentation

## 2023-06-24 DIAGNOSIS — N186 End stage renal disease: Secondary | ICD-10-CM | POA: Diagnosis not present

## 2023-06-24 DIAGNOSIS — Y732 Prosthetic and other implants, materials and accessory gastroenterology and urology devices associated with adverse incidents: Secondary | ICD-10-CM | POA: Diagnosis not present

## 2023-06-24 MED ORDER — ALUM & MAG HYDROXIDE-SIMETH 200-200-20 MG/5ML PO SUSP
30.0000 mL | Freq: Once | ORAL | Status: AC
  Administered 2023-06-24: 30 mL via ORAL
  Filled 2023-06-24: qty 30

## 2023-06-24 MED ORDER — OXYCODONE HCL 5 MG PO TABS
5.0000 mg | ORAL_TABLET | Freq: Once | ORAL | Status: AC
  Administered 2023-06-24: 5 mg via ORAL
  Filled 2023-06-24: qty 1

## 2023-06-24 NOTE — Discharge Instructions (Signed)
 Please keep this loosefitting dressing in place until lunchtime tomorrow.  You may then remove it and continue your dialysis as scheduled.  Please make them aware that she did have some bleeding after the last treatment. If bleeding returns and is heavy, hold firm pressure and call 911.

## 2023-06-24 NOTE — ED Provider Notes (Signed)
 Emergency Department Provider Note   I have reviewed the triage vital signs and the nursing notes.   HISTORY  Chief Complaint Vascular Access Problem   HPI Ashley [name] is a 73 y.o. female with PMH reviewed presents to the ED with bleeding from her left upper arm fistula. She had a full HD session this AM and returned home without issue. She went to the bathroom and noticed that the arm began bleeding heavily. She attempted to hold pressure and called 911. Fire arrived on scene and held pressure. EMS report applying a firm dressing with no heavy bleeding in the last 10 minutes. Patient  is not anticoagulated. No SOB, weakness, or fatigue symptoms.    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  genotype 2B.  Epclusa  started early 04/2020.     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  Chronic microvascular ischemic changes w parenchymal volume loss. Chronic white matter periventricular microhemorrhage, likely due to htn   Ulcer 1990   gastric ulcer. Ruptured s/p emergency repair    Review of Systems  Constitutional: No fever/chills Cardiovascular: Denies chest pain. Bleeding from left upper arm fistula.  Respiratory: Denies shortness of breath. Gastrointestinal: No abdominal pain.   Skin: Negative for rash. Neurological: Negative for headaches.  ____________________________________________   PHYSICAL EXAM:  VITAL  SIGNS: Vitals:   06/24/23 2030 06/24/23 2049  BP:  (!) 161/76  Pulse: 63 63  Resp:  16  Temp:  98.5 F (36.9 C)  SpO2: 98% 98%    Constitutional: Alert and oriented. Well appearing and in no acute distress. Eyes: Conjunctivae are normal.  Head: Atraumatic. Nose: No congestion/rhinnorhea. Mouth/Throat: Mucous membranes are moist.   Neck: No stridor.   Cardiovascular: Palpable thrill in the LUE fistula. Minimal oozing from the fistula. No arterial bleeding appreciated.  Respiratory: Normal respiratory effort.  No retractions. Lungs CTAB. Gastrointestinal: Soft and nontender. No distention.  Musculoskeletal: No gross deformities of extremities. Neurologic:  Normal speech and language.  Skin:  Skin is warm, dry and intact. No rash noted.  ____________________________________________   PROCEDURES  Procedure(s) performed:   Procedures  None  ____________________________________________   INITIAL IMPRESSION / ASSESSMENT AND PLAN / ED COURSE  Pertinent labs & imaging results that were available during my care of the patient were reviewed by me and considered in my medical decision making (see chart for details).   This patient is Presenting for Evaluation of fistula bleeding, which does require a range of treatment options, and is a complaint that involves a high risk of morbidity and mortality.  The Differential Diagnoses include HD puncture site, arterial bleeding, infection, etc.   Clinical Laboratory Tests: but no acute symptoms or abnormal vitals to prompt labs. Had HD this AM.   Medical Decision Making: Summary:  Patient presents to the ED with left  fistula bleeding. Minimal bleeding after EMS applied firm dressing. Removed by me upon arrival with some residual oozing blood. Re-applied the firm dressing but continues to have a palpable thrill in the fistula with good peripheral circulation.   Reevaluation with update and discussion with patient. Patient observed in the  ED for 4.5 hours without re-bleeding. Will leave the loose dressing in place for d/c home.   Patient's presentation is most consistent with acute presentation with potential threat to life or bodily function.   Disposition: discharge  ____________________________________________  FINAL CLINICAL IMPRESSION(S) / ED DIAGNOSES  Final diagnoses:  Bleeding from dialysis shunt, initial encounter [facility])     Note:  This document was prepared using Dragon voice recognition software and may include unintentional dictation errors.  Abby Hocking, MD, [facility] Emergency Medicine    Ly Bacchi, Shereen Dike, MD 06/24/23 2056

## 2023-06-24 NOTE — ED Triage Notes (Signed)
 Pt bib [facility] from home, states she had her full dialysis treatment this morning, went home and took her bandages off and her fistula was still bleeding. Bleeding controlled with gauze. Pt also c.o chronic abd pain, states it is her pancreas

## 2023-06-25 ENCOUNTER — Ambulatory Visit: Payer: 59 | Admitting: Physician Assistant

## 2023-07-14 LAB — LAB REPORT - SCANNED: EGFR: 9

## 2023-08-06 ENCOUNTER — Ambulatory Visit: Payer: 59 | Admitting: Internal Medicine

## 2023-08-12 ENCOUNTER — Telehealth: Payer: Self-pay

## 2023-08-12 NOTE — Telephone Encounter (Signed)
 Procedure:endo Procedure date: 08/20/23 Procedure location: wl Arrival Time: 11:15 Spoke with the patient Y/N: y Any prep concerns? n  Has the patient obtained the prep from the pharmacy ? n Do you have a care partner and transportation: y Any additional concerns? Pt was confused that she had a ppointment.

## 2023-08-13 ENCOUNTER — Encounter (HOSPITAL_COMMUNITY): Payer: Self-pay | Admitting: Gastroenterology

## 2023-08-20 ENCOUNTER — Ambulatory Visit (HOSPITAL_COMMUNITY)
Admission: RE | Admit: 2023-08-20 | Discharge: 2023-08-20 | Disposition: A | Discharge status: Home / Self Care | Payer: 59 | Attending: Gastroenterology | Admitting: Gastroenterology

## 2023-08-20 ENCOUNTER — Ambulatory Visit (HOSPITAL_COMMUNITY)

## 2023-08-20 ENCOUNTER — Ambulatory Visit (HOSPITAL_COMMUNITY): Admitting: Anesthesiology

## 2023-08-20 ENCOUNTER — Encounter (HOSPITAL_COMMUNITY): Payer: Self-pay | Admitting: Gastroenterology

## 2023-08-20 ENCOUNTER — Encounter (HOSPITAL_COMMUNITY)
Admission: RE | Disposition: A | Discharge status: Home / Self Care | Payer: Self-pay | Source: Home / Self Care | Attending: Gastroenterology

## 2023-08-20 DIAGNOSIS — K3189 Other diseases of stomach and duodenum: Secondary | ICD-10-CM | POA: Insufficient documentation

## 2023-08-20 DIAGNOSIS — K31819 Angiodysplasia of stomach and duodenum without bleeding: Secondary | ICD-10-CM

## 2023-08-20 DIAGNOSIS — I899 Noninfective disorder of lymphatic vessels and lymph nodes, unspecified: Secondary | ICD-10-CM | POA: Diagnosis not present

## 2023-08-20 DIAGNOSIS — K8689 Other specified diseases of pancreas: Secondary | ICD-10-CM | POA: Diagnosis not present

## 2023-08-20 DIAGNOSIS — N186 End stage renal disease: Secondary | ICD-10-CM | POA: Insufficient documentation

## 2023-08-20 DIAGNOSIS — K2289 Other specified disease of esophagus: Secondary | ICD-10-CM | POA: Diagnosis not present

## 2023-08-20 DIAGNOSIS — K2950 Unspecified chronic gastritis without bleeding: Secondary | ICD-10-CM | POA: Insufficient documentation

## 2023-08-20 DIAGNOSIS — Z992 Dependence on renal dialysis: Secondary | ICD-10-CM | POA: Insufficient documentation

## 2023-08-20 DIAGNOSIS — K317 Polyp of stomach and duodenum: Secondary | ICD-10-CM

## 2023-08-20 DIAGNOSIS — K31811 Angiodysplasia of stomach and duodenum with bleeding: Secondary | ICD-10-CM | POA: Insufficient documentation

## 2023-08-20 DIAGNOSIS — K8500 Idiopathic acute pancreatitis without necrosis or infection: Secondary | ICD-10-CM

## 2023-08-20 DIAGNOSIS — K922 Gastrointestinal hemorrhage, unspecified: Secondary | ICD-10-CM

## 2023-08-20 DIAGNOSIS — F17210 Nicotine dependence, cigarettes, uncomplicated: Secondary | ICD-10-CM | POA: Insufficient documentation

## 2023-08-20 DIAGNOSIS — K31A19 Gastric intestinal metaplasia without dysplasia, unspecified site: Secondary | ICD-10-CM | POA: Diagnosis not present

## 2023-08-20 DIAGNOSIS — K219 Gastro-esophageal reflux disease without esophagitis: Secondary | ICD-10-CM | POA: Insufficient documentation

## 2023-08-20 DIAGNOSIS — Q453 Other congenital malformations of pancreas and pancreatic duct: Secondary | ICD-10-CM

## 2023-08-20 DIAGNOSIS — K5520 Angiodysplasia of colon without hemorrhage: Secondary | ICD-10-CM

## 2023-08-20 DIAGNOSIS — I10 Essential (primary) hypertension: Secondary | ICD-10-CM | POA: Diagnosis not present

## 2023-08-20 DIAGNOSIS — K859 Acute pancreatitis without necrosis or infection, unspecified: Secondary | ICD-10-CM

## 2023-08-20 DIAGNOSIS — K449 Diaphragmatic hernia without obstruction or gangrene: Secondary | ICD-10-CM | POA: Insufficient documentation

## 2023-08-20 DIAGNOSIS — I120 Hypertensive chronic kidney disease with stage 5 chronic kidney disease or end stage renal disease: Secondary | ICD-10-CM | POA: Insufficient documentation

## 2023-08-20 DIAGNOSIS — K861 Other chronic pancreatitis: Secondary | ICD-10-CM | POA: Diagnosis present

## 2023-08-20 LAB — POCT I-STAT, CHEM 8
BUN: 29 mg/dL — ABNORMAL HIGH (ref 8–23)
Calcium, Ion: 0.94 mmol/L — ABNORMAL LOW (ref 1.15–1.40)
Chloride: 100 mmol/L (ref 98–111)
Creatinine, Ser: 7.9 mg/dL — ABNORMAL HIGH (ref 0.44–1.00)
Glucose, Bld: 89 mg/dL (ref 70–99)
HCT: 50 % — ABNORMAL HIGH (ref 36.0–46.0)
Hemoglobin: 17 g/dL — ABNORMAL HIGH (ref 12.0–15.0)
Potassium: 4.9 mmol/L (ref 3.5–5.1)
Sodium: 139 mmol/L (ref 135–145)
TCO2: 30 mmol/L (ref 22–32)

## 2023-08-20 SURGERY — UPPER ENDOSCOPIC ULTRASOUND (EUS) RADIAL
Anesthesia: Monitor Anesthesia Care

## 2023-08-20 MED ORDER — SODIUM CHLORIDE 0.9 % IV SOLN: INTRAVENOUS | Status: DC | PRN

## 2023-08-20 MED ORDER — FENTANYL CITRATE (PF) 100 MCG/2ML IJ SOLN
INTRAMUSCULAR | Status: AC
  Filled 2023-08-20: qty 2

## 2023-08-20 MED ORDER — PROPOFOL 1000 MG/100ML IV EMUL
INTRAVENOUS | Status: AC
  Filled 2023-08-20: qty 100

## 2023-08-20 MED ORDER — SODIUM CHLORIDE 0.9 % IV SOLN: INTRAVENOUS | Status: DC

## 2023-08-20 MED ORDER — PANTOPRAZOLE SODIUM 40 MG PO TBEC: 40.0000 mg | DELAYED_RELEASE_TABLET | Freq: Two times a day (BID) | ORAL | 0 refills | Status: DC

## 2023-08-20 MED ORDER — FENTANYL CITRATE (PF) 100 MCG/2ML IJ SOLN
25.0000 ug | Freq: Once | INTRAMUSCULAR | Status: AC
  Administered 2023-08-20: 25 ug via INTRAVENOUS

## 2023-08-20 MED ORDER — LIDOCAINE 2% (20 MG/ML) 5 ML SYRINGE
INTRAMUSCULAR | Status: DC | PRN
  Administered 2023-08-20: 100 mg via INTRAVENOUS

## 2023-08-20 MED ORDER — OXYCODONE HCL 5 MG PO TABS: 5.0000 mg | ORAL_TABLET | Freq: Four times a day (QID) | ORAL | 0 refills | Status: AC | PRN

## 2023-08-20 MED ORDER — HYOSCYAMINE SULFATE 0.125 MG SL SUBL
0.1250 mg | SUBLINGUAL_TABLET | Freq: Once | SUBLINGUAL | Status: AC
  Administered 2023-08-20: 0.125 mg via ORAL
  Filled 2023-08-20: qty 1

## 2023-08-20 MED ORDER — PROPOFOL 500 MG/50ML IV EMUL
INTRAVENOUS | Status: DC | PRN
  Administered 2023-08-20: 150 ug/kg/min via INTRAVENOUS

## 2023-08-20 NOTE — Discharge Instructions (Signed)

## 2023-08-20 NOTE — Op Note (Signed)
 [facility] Patient Name: Ashley [name] Procedure Date: 08/20/2023 MRN: [id number] Attending MD: Corliss Parish , MD, [id number] Date of Birth: May 20, 1951 CSN: [id number] Age: 73 Admit Type: Outpatient Procedure:                Upper EUS Indications:              Dilated pancreatic duct on CT scan, Dilated                            pancreatic duct on MRCP, Chronic pancreatitis Providers:                Corliss Parish, MD, Margaree Mackintosh, RN,                            Harrington Challenger, Technician Referring MD:              Medicines:                Monitored Anesthesia Care Complications:            No immediate complications. Estimated Blood Loss:     Estimated blood loss was minimal. Procedure:                Pre-Anesthesia Assessment:                           - Prior to the procedure, a History and Physical                            was performed, and patient medications and                            allergies were reviewed. The patient's tolerance of                            previous anesthesia was also reviewed. The risks                            and benefits of the procedure and the sedation                            options and risks were discussed with the patient.                            All questions were answered, and informed consent                            was obtained. Prior Anticoagulants: The patient has                            taken no anticoagulant or antiplatelet agents. ASA                            Grade Assessment: III - A patient with severe  systemic disease. After reviewing the risks and                            benefits, the patient was deemed in satisfactory                            condition to undergo the procedure.                           After obtaining informed consent, the endoscope was                            passed under direct vision. Throughout the                             procedure, the patient's blood pressure, pulse, and                            oxygen saturations were monitored continuously. The                            [id number]-H190 ([id number]) Olympus endoscope was introduced                            through the mouth, and advanced to the second part                            of duodenum. The [id number]-[id number] ([id number]) Olympus                            duodenoscope was introduced through the mouth, and                            advanced to the area of papilla. The [id number]                            ([id number]) Olympus linear ultrasound scope was                            introduced through the mouth, and advanced to the                            duodenum for ultrasound examination from the                            stomach and duodenum. The upper EUS was                            accomplished without difficulty. The patient                            tolerated the procedure. Scope In: Scope Out: Findings:      ENDOSCOPIC FINDING: :      No gross lesions were noted in the  entire esophagus.      The Z-line was irregular and was found 39 cm from the incisors.      A 2 cm hiatal hernia was present.      A single medium-sized angioectasia with no bleeding was found in the       cardia. Fulguration to ablate the lesion to prevent bleeding by argon       plasma (gastric settings) was successful. To prevent bleeding       post-intervention, one hemostatic clip was successfully placed. There       was no bleeding at the end of the procedure.      Multiple dispersed small erosions with no bleeding and no stigmata of       recent bleeding were found in the entire examined stomach. Biopsies were       taken with a cold forceps for histology and Helicobacter pylori testing.      A single 12 mm sessile polyp was found in the gastric antrum (greater       curve). The polyp was removed with a cold snare. Resection and retrieval       were complete. To prevent  bleeding after the polypectomy, one hemostatic       clip was successfully placed (MR conditional). There was no bleeding at       the end of the procedure.      Red blood was found in the duodenal bulb. This was lavaged and suctioned.      Three small angioectasias with typical arborization were found in the       duodenal bulb, in the duodenal sweep and in the second portion of the       duodenum. Fulguration to ablate the lesion to prevent bleeding by argon       plasma (right colon settings) was successful.      The major papilla was normal.      No other gross mucosal lesions were noted in the duodenal bulb, in the       first portion of the duodenum and in the second portion of the duodenum.      ENDOSONOGRAPHIC FINDING: :      Endosonographic imaging of the pancreas showed sonographic changes       indicative of moderate-severe chronic pancreatitis in the entire       pancreas. The parenchyma had lobularity with honeycombing and       hyperechoic strands. The pancreatic duct had irregularity of contour,       dilated side-branches, duct dilation and a hyperechoic duct margin.       Pancreas duct head = 1.1 -> 2.7 mm, pancreas duct neck = 3.8 mm,       pancreas duct body = 2.8 -> 2.2 mm, pancreas duct tail = 1.9 mm.      Endosonographic imaging in the entire pancreas showed no cyst/pseudocyst       or mass.      There was no sign of significant endosonographic abnormality in the       common bile duct (4.6 mm) and in the common hepatic duct (7.2 mm).      Endosonographic imaging of the ampulla showed no intramural       (subepithelial) lesion.      Endosonographic imaging in the visualized portion of the liver showed no       mass.      No malignant-appearing lymph nodes were visualized in the celiac region       (  level 20), peripancreatic region and porta hepatis region.      The celiac region was visualized. Impression:               EGD impression:                           -  No gross lesions in the entire esophagus. Z-line                            irregular, 39 cm from the incisors.                           - 2 cm hiatal hernia.                           - A single non-bleeding angioectasia in the                            stomach. Treated with argon plasma coagulation                            (APC). Clip was placed.                           - Erosive gastropathy with no bleeding and no                            stigmata of recent bleeding. Biopsied.                           - A single gastric polyp. Resected and retrieved.                            Clip (MR conditional) was placed.                           - Blood in the duodenal bulb - lavaged and                            suctioned.                           - Three angioectasias in the duodenum. Treated with                            argon plasma coagulation (APC).                           - Normal major papilla.                           - No other gross lesions in the duodenal bulb, in                            the first portion of the duodenum and in the second  portion of the duodenum.                           EUS impression:                           - Endosonographic imaging of the pancreas showed                            sonographic changes consistent with moderate-severe                            chronic pancreatitis.                           - There was no sign of significant pathology in the                            common bile duct and in the common hepatic duct.                           - No malignant-appearing lymph nodes were                            visualized in the celiac region (level 20),                            peripancreatic region and porta hepatis region. Moderate Sedation:      Not Applicable - Patient had care per Anesthesia. Recommendation:           - The patient will be observed post-procedure,                             until all discharge criteria are met.                           - Discharge patient to home.                           - Advance diet as tolerated.                           - Observe patient's clinical course.                           - Consider increasing Creon to 72,000 units with                            each meal and 36,000 units with each snack.                           - It is not clear to me that in the setting of her                            irregularity of the PD and without persistent PD  dilation that pancreatic ERCP and stenting would be                            of any assistance for her at this time.                           - Recommend every 1 to 2 years some form of                            cross-sectional imaging and evaluation of the                            pancreas to ensure no evidence of a development of                            a mass or lesion, as patients with chronic                            pancreatitis are at increased risk for malignancy                            as opposed to the general population.                           - Increase Protonix for 1 month to twice daily                            dosing then back to once daily dosing.                           - Observe patient's clinical course.                           - Await path results.                           - If evidence of iron deficiency is found in                            future, query role of video capsule endoscopy since                            we found for AVMs on today's examination.                           - If the polyp returns as adenomatous, recommend a                            1 year follow-up upper endoscopy to evaluate the                            site within the antrum.                           -  The findings and recommendations were discussed                            with the patient.                           - The  findings and recommendations were discussed                            with the patient's family. Procedure Code(s):        --- Professional ---                           [id number], 59, Esophagogastroduodenoscopy, flexible,                            transoral; with control of bleeding, any method                           43251, Esophagogastroduodenoscopy, flexible,                            transoral; with removal of tumor(s), polyp(s), or                            other lesion(s) by snare technique                           43237, Esophagogastroduodenoscopy, flexible,                            transoral; with endoscopic ultrasound examination                            limited to the esophagus, stomach or duodenum, and                            adjacent structures                           43239, 59, Esophagogastroduodenoscopy, flexible,                            transoral; with biopsy, single or multiple Diagnosis Code(s):        --- Professional ---                           K86.1, Other chronic pancreatitis                           K22.89, Other specified disease of esophagus                           K44.9, Diaphragmatic hernia without obstruction or                            gangrene  K31.819, Angiodysplasia of stomach and duodenum                            without bleeding                           K31.89, Other diseases of stomach and duodenum                           K31.7, Polyp of stomach and duodenum                           K92.2, Gastrointestinal hemorrhage, unspecified                           I89.9, Noninfective disorder of lymphatic vessels                            and lymph nodes, unspecified                           K86.89, Other specified diseases of pancreas CPT copyright 2022 [employer]. All rights reserved. The codes documented in this report are preliminary and upon coder review may  be revised to meet current  compliance requirements. Corliss Parish, MD 08/20/2023 1:10:06 PM Number of Addenda: 0

## 2023-08-20 NOTE — Anesthesia Preprocedure Evaluation (Signed)
 Anesthesia Evaluation  Patient identified by MRN, date of birth, ID band Patient awake    Reviewed: Allergy & Precautions, NPO status , Patient's Chart, lab work & pertinent test results  Airway Mallampati: II  TM Distance: >3 FB Neck ROM: Full    Dental no notable dental hx.    Pulmonary neg pulmonary ROS, Current Smoker   Pulmonary exam normal        Cardiovascular hypertension, Pt. on medications  Rhythm:Regular Rate:Normal     Neuro/Psych CVA  negative psych ROS   GI/Hepatic hiatal hernia, PUD,GERD  Medicated,,(+) Hepatitis -, C  Endo/Other  negative endocrine ROS    Renal/GU ESRF and DialysisRenal disease  negative genitourinary   Musculoskeletal  (+) Arthritis , Osteoarthritis,    Abdominal Normal abdominal exam  (+)   Peds  Hematology  (+) Blood dyscrasia, anemia   Anesthesia Other Findings   Reproductive/Obstetrics                             Anesthesia Physical Anesthesia Plan  ASA: 3  Anesthesia Plan: MAC   Post-op Pain Management:    Induction: Intravenous  PONV Risk Score and Plan: 1 and Propofol infusion and Treatment may vary due to age or medical condition  Airway Management Planned: Simple Face Mask and Nasal Cannula  Additional Equipment: None  Intra-op Plan:   Post-operative Plan:   Informed Consent: I have reviewed the patients History and Physical, chart, labs and discussed the procedure including the risks, benefits and alternatives for the proposed anesthesia with the patient or authorized representative who has indicated his/her understanding and acceptance.     Dental advisory given  Plan Discussed with: CRNA  Anesthesia Plan Comments:        Anesthesia Quick Evaluation

## 2023-08-20 NOTE — H&P (Signed)
 GASTROENTEROLOGY PROCEDURE H&P NOTE   Primary Care Physician: Raymon Mutton., FNP  HPI: Ashley [name] is a 73 y.o. 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  genotype 2B.  Epclusa started early 04/2020.     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  Chronic microvascular ischemic changes w parenchymal volume loss. Chronic white matter periventricular microhemorrhage, likely due to htn   Ulcer 1990   gastric ulcer. Ruptured s/p emergency repair   Past Surgical History:  Procedure Laterality Date   ABDOMINAL HYSTERECTOMY  1979   AV FISTULA PLACEMENT Left 06/16/2017   Procedure: ARTERIOVENOUS (AV) FISTULA CREATION LEFT ARM;  Surgeon: Fransisco Hertz, MD;  Location[facility] OR;  Service: Vascular;  Laterality: Left;   BASCILIC VEIN TRANSPOSITION Left 10/02/2017   Procedure: BASILIC VEIN TRANSPOSITION SECOND STAGE LEFT ARM;  Surgeon: Larina Earthly, MD;  Location: [facility] OR;  Service: Vascular;  Laterality: Left;   BIOPSY  09/06/2019   Procedure: BIOPSY;  Surgeon: Iva Boop, MD;  Location: [facility] ENDOSCOPY;  Service: Endoscopy;;   BIOPSY  03/13/2023   Procedure: BIOPSY;  Surgeon: Shellia Cleverly, DO;  Location: [facility] ENDOSCOPY;  Service: Gastroenterology;;   COLONOSCOPY WITH PROPOFOL N/A 03/13/2023   Procedure:  COLONOSCOPY WITH PROPOFOL;  Surgeon: Shellia Cleverly, DO;  Location: [facility] ENDOSCOPY;  Service: Gastroenterology;  Laterality: N/A;   ENTEROSCOPY N/A 10/22/2022   Procedure: ENTEROSCOPY;  Surgeon: Sherrilyn Rist, MD;  Location: [facility] ENDOSCOPY;  Service: Gastroenterology;  Laterality: N/A;   ESOPHAGOGASTRODUODENOSCOPY N/A 05/29/2013   Procedure: ESOPHAGOGASTRODUODENOSCOPY (EGD);  Surgeon: Beverley Fiedler, MD;  Location: [facility] ENDOSCOPY;  Service: Endoscopy;  Laterality: N/A;   ESOPHAGOGASTRODUODENOSCOPY  05/2013   for epigastric pain.  Nonobstructing Schatzki ring at GEJ, mild gastropathy, nonbleeding AVMs in bulb and D2. 5 mm sessile polyp in bulb.   ESOPHAGOGASTRODUODENOSCOPY (EGD) WITH PROPOFOL N/A 09/06/2019   Procedure: ESOPHAGOGASTRODUODENOSCOPY (EGD) WITH PROPOFOL;  Surgeon: Iva Boop, MD;  Location: [facility] ENDOSCOPY;  Service: Endoscopy;  Laterality: N/A;   ESOPHAGOGASTRODUODENOSCOPY (EGD) WITH PROPOFOL N/A 02/02/2020   Procedure: ESOPHAGOGASTRODUODENOSCOPY (EGD) WITH PROPOFOL;  Surgeon: Rachael Fee, MD;  Location: [facility] ENDOSCOPY;  Service: Endoscopy;  Laterality: N/A;   ESOPHAGOGASTRODUODENOSCOPY (EGD) WITH PROPOFOL N/A 02/07/2021   Procedure: ESOPHAGOGASTRODUODENOSCOPY (EGD) WITH PROPOFOL;  Surgeon: Rachael Fee, MD;  Location: [facility] ENDOSCOPY;  Service: Endoscopy;  Laterality: N/A;   ESOPHAGOGASTRODUODENOSCOPY (EGD) WITH PROPOFOL N/A 03/13/2023   Procedure: ESOPHAGOGASTRODUODENOSCOPY (EGD) WITH PROPOFOL;  Surgeon: Shellia Cleverly, DO;  Location: [facility] ENDOSCOPY;  Service: Gastroenterology;  Laterality: N/A;   EUS N/A 02/02/2020   Procedure: UPPER ENDOSCOPIC ULTRASOUND (EUS) RADIAL;  Surgeon: Rachael Fee, MD;  Location: [facility] ENDOSCOPY;  Service: Endoscopy;  Laterality:  N/A;   EXCHANGE OF A DIALYSIS CATHETER Left 07/31/2017   Procedure: Removal  OF A  Right GroinTUNNELED  DIALYSIS CATHETER ,  Insertion of Left Femoral Dialysis Catheter.;  Surgeon: Larina Earthly, MD;  Location: [facility] OR;  Service:  Vascular;  Laterality: Left;   HEMOSTASIS CLIP PLACEMENT  02/07/2021   Procedure: HEMOSTASIS CLIP PLACEMENT;  Surgeon: Rachael Fee, MD;  Location: [facility] ENDOSCOPY;  Service: Endoscopy;;   HEMOSTASIS CLIP PLACEMENT  10/22/2022   Procedure: HEMOSTASIS CLIP PLACEMENT;  Surgeon: Sherrilyn Rist, MD;  Location: [facility] ENDOSCOPY;  Service: Gastroenterology;;   HEMOSTASIS CLIP PLACEMENT  03/13/2023   Procedure: HEMOSTASIS CLIP PLACEMENT;  Surgeon: Shellia Cleverly, DO;  Location: [facility] ENDOSCOPY;  Service: Gastroenterology;;   HOT HEMOSTASIS N/A 09/06/2019   Procedure: HOT HEMOSTASIS (ARGON PLASMA COAGULATION/BICAP);  Surgeon: Iva Boop, MD;  Location: [facility] ENDOSCOPY;  Service: Endoscopy;  Laterality: N/A;   HOT HEMOSTASIS N/A 02/07/2021   Procedure: HOT HEMOSTASIS (ARGON PLASMA COAGULATION/BICAP);  Surgeon: Rachael Fee, MD;  Location: [facility] ENDOSCOPY;  Service: Endoscopy;  Laterality: N/A;   HOT HEMOSTASIS N/A 10/22/2022   Procedure: HOT HEMOSTASIS (ARGON PLASMA COAGULATION/BICAP);  Surgeon: Sherrilyn Rist, MD;  Location: [facility] ENDOSCOPY;  Service: Gastroenterology;  Laterality: N/A;   INSERTION OF DIALYSIS CATHETER Right 06/16/2017   Procedure: INSERTION OF DIALYSIS CATHETER;  Surgeon: Fransisco Hertz, MD;  Location: [facility] OR;  Service: Vascular;  Laterality: Right;   IR AV DIALY SHUNT INTRO NEEDLE/INTRACATH INITIAL W/PTA/IMG LEFT  06/21/2018   POLYPECTOMY  03/13/2023   Procedure: POLYPECTOMY;  Surgeon: Shellia Cleverly, DO;  Location: [facility] ENDOSCOPY;  Service: Gastroenterology;;   REPAIR OF PERFORATED ULCER  1990   gastric ulcer   SCLEROTHERAPY  10/22/2022   Procedure: SCLEROTHERAPY;  Surgeon: Sherrilyn Rist, MD;  Location: [facility] ENDOSCOPY;  Service: Gastroenterology;;   SUBMUCOSAL TATTOO INJECTION  10/22/2022   Procedure: SUBMUCOSAL TATTOO INJECTION;  Surgeon: Sherrilyn Rist, MD;  Location: [facility] ENDOSCOPY;  Service: Gastroenterology;;   No current facility-administered medications for this encounter.    No current facility-administered medications for this encounter. Allergies  Allergen Reactions   Aspirin Nausea And Vomiting    Stomach ache   Ibuprofen Nausea And Vomiting    Stomach ache   Family History  Problem Relation Age of Onset   Hypertension Father    Cancer Father    Hyperlipidemia Father    Seizures Sister    Early death Daughter    Kidney disease Daughter        end stage dialysis dependent    Breast cancer Maternal Aunt    Social History   Socioeconomic History   Marital status: Widowed    Spouse name: Not on file   Number of children: 3   Years of education: Not on file   Highest education level: Not on file  Occupational History   Occupation: retired  Tobacco Use   Smoking status: Every Day    Current packs/day: 0.25    Average packs/day: 0.3 packs/day for 40.0 years (10.0 ttl pk-yrs)    Types: Cigarettes    Passive exposure: Current   Smokeless tobacco: Never  Vaping Use   Vaping status: Never Used  Substance and Sexual Activity   Alcohol use: No    Alcohol/week: 0.0 standard drinks of alcohol   Drug use: Not Currently    Types: Heroin, Marijuana, Cocaine    Comment: hasn't used cocaine in 1-2 years; she smokes marijuana daily, "  whenever I can get it"   Sexual activity: Never  Other Topics Concern   Not on file  Social History Narrative   Not on file   Social Drivers of Health   Financial Resource Strain: Not on file  Food Insecurity: No Food Insecurity (06/19/2023)   Hunger Vital Sign    Worried About Running Out of Food in the Last Year: Never true    Ran Out of Food in the Last Year: Never true  Transportation Needs: Unmet Transportation Needs (06/19/2023)   PRAPARE - Administrator, Civil Service (Medical): Yes    Lack of Transportation (Non-Medical): Yes  Physical Activity: Not on file  Stress: Not on file  Social Connections: Moderately Integrated (06/19/2023)   Social Connection and Isolation Panel [NHANES]     Frequency of Communication with Friends and Family: Three times a week    Frequency of Social Gatherings with Friends and Family: Three times a week    Attends Religious Services: More than 4 times per year    Active Member of Clubs or Organizations: No    Attends Banker Meetings: 1 to 4 times per year    Marital Status: Widowed  Intimate Partner Violence: Not At Risk (06/19/2023)   Humiliation, Afraid, Rape, and Kick questionnaire    Fear of Current or Ex-Partner: No    Emotionally Abused: No    Physically Abused: No    Sexually Abused: No    Physical Exam: Today's Vitals   08/13/23 1150  Weight: 46.3 kg   Body mass index is 18.66 kg/m. GEN: NAD EYE: Sclerae anicteric ENT: MMM CV: Non-tachycardic GI: Soft, NT/ND NEURO:  Alert & Oriented x 3  Lab Results: No results for input(s): "WBC", "HGB", "HCT", "PLT" in the last 72 hours. BMET No results for input(s): "NA", "K", "CL", "CO2", "GLUCOSE", "BUN", "CREATININE", "CALCIUM" in the last 72 hours. LFT No results for input(s): "PROT", "ALBUMIN", "AST", "ALT", "ALKPHOS", "BILITOT", "BILIDIR", "IBILI" in the last 72 hours. PT/INR No results for input(s): "LABPROT", "INR" in the last 72 hours.   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  The risks of an EUS including intestinal perforation, bleeding, infection, aspiration, and medication effects were discussed as was the possibility it may not give a definitive diagnosis if a biopsy is performed.  When a biopsy of the pancreas is done as part of the EUS, there is an additional risk of pancreatitis at the rate of about 1-2%.  It was explained that procedure related pancreatitis is typically mild, although it can be severe and even life threatening, which is why we do not perform random pancreatic biopsies and only biopsy a lesion/area we feel is  concerning enough to warrant the risk.  The risks and benefits of endoscopic evaluation/treatment were discussed with the patient and/or family; these include but are not limited to the risk of perforation, infection, bleeding, missed lesions, lack of diagnosis, severe illness requiring hospitalization, as well as anesthesia and sedation related illnesses.  The patient's history has been reviewed, patient examined, no change in status, and deemed stable for procedure.  The patient and/or family is agreeable to proceed.    Corliss Parish, MD [facility] Advanced Endoscopy Office # [phone]

## 2023-08-20 NOTE — Progress Notes (Addendum)
 Patient with pain post-procedure in recovery. She states that it is her pancreatitis pain, but she had no biopsies performed of the pancreas, so that is not possible. I need to rule out perforation, though feel that this is less likely. Will give IV Pain medication and consider PO pain medication at discharge, if negative for Perforation on imaging. Discussed with daughter, who is not sure if patient is pain-medication seeking or not, but she wants [place] to be sure that we are aware that she deals with chronic pain daily.  KUB/CXR STAT ordered. If negative, and patient is stable will plan discharge. If not negative, and pain not controlled will need inpatient observation.  Corliss Parish, MD [facility] Office # [phone]   Reviewed with Radiology and no concerning findings on the KUB/CXR imaging. Patient feeling improved at this time. She is to be discharged at this time.  Corliss Parish, MD [facility] Office # [phone]

## 2023-08-20 NOTE — Transfer of Care (Signed)
 Immediate Anesthesia Transfer of Care Note  Patient: Ashley [name]  Procedure(s) Performed: UPPER ENDOSCOPIC ULTRASOUND (EUS) RADIAL EGD, WITH ARGON PLASMA COAGULATION POLYPECTOMY CONTROL OF HEMORRHAGE, GI TRACT, ENDOSCOPIC, BY CLIPPING OR OVERSEWING  Patient Location: PACU  Anesthesia Type:MAC  Level of Consciousness: awake  Airway & Oxygen Therapy: Patient Spontanous Breathing  Post-op Assessment: Report given to RN and Post -op Vital signs reviewed and stable  Post vital signs: Reviewed and stable  Last Vitals:  Vitals Value Taken Time  BP 132/47 08/20/23 1254  Temp    Pulse 75 08/20/23 1256  Resp 21 08/20/23 1256  SpO2 98 % 08/20/23 1256  Vitals shown include unfiled device data.  Last Pain:  Vitals:   08/20/23 1123  TempSrc: Temporal  PainSc: 0-No pain         Complications: No notable events documented.

## 2023-08-21 ENCOUNTER — Encounter: Payer: Self-pay | Admitting: Gastroenterology

## 2023-08-21 LAB — SURGICAL PATHOLOGY

## 2023-08-21 NOTE — Anesthesia Postprocedure Evaluation (Signed)
 Anesthesia Post Note  Patient: Ashley [name]  Procedure(s) Performed: UPPER ENDOSCOPIC ULTRASOUND (EUS) RADIAL EGD, WITH ARGON PLASMA COAGULATION POLYPECTOMY CONTROL OF HEMORRHAGE, GI TRACT, ENDOSCOPIC, BY CLIPPING OR OVERSEWING     Patient location during evaluation: PACU Anesthesia Type: MAC Level of consciousness: awake and alert Pain management: pain level controlled Vital Signs Assessment: post-procedure vital signs reviewed and stable Respiratory status: spontaneous breathing, nonlabored ventilation, respiratory function stable and patient connected to nasal cannula oxygen Cardiovascular status: stable and blood pressure returned to baseline Postop Assessment: no apparent nausea or vomiting Anesthetic complications: no   No notable events documented.  Last Vitals:  Vitals:   08/20/23 1400 08/20/23 1410  BP: (!) 161/55 (!) 158/54  Pulse: 67 67  Resp: 18 16  Temp:    SpO2: 95% 96%    Last Pain:  Vitals:   08/20/23 1410  TempSrc:   PainSc: 0-No pain                 Earl Lites P Brae Gartman

## 2023-08-23 ENCOUNTER — Encounter (HOSPITAL_COMMUNITY): Payer: Self-pay | Admitting: Gastroenterology

## 2023-09-04 NOTE — Telephone Encounter (Signed)
error 

## 2023-10-18 NOTE — Progress Notes (Unsigned)
 JAMILIA TORUNO, female    DOB: August 23, 1950   MRN: [id number]   Brief patient profile:  73 yobf active smoker  referred to pulmonary clinic 10/20/2023 by Gaetana Jones NP  for copd eval       On HD since 2020   Pt not previously seen by PCCM service.    History of Present Illness  10/20/2023  Pulmonary/ 1st office eval/Wayde Gopaul  Chief Complaint  Patient presents with   Consult    COPD-sob and weak.  Dyspnea:getting worse x sev years to point where best days could still do a whole grocery aisle s stopping a month prior to OV   Cough: smoker's rattle > clear mucus  x 20 min  Sleep: bed is flat / a couple of pillows no noct resp cc  SABA use: not using  02 NWG:NFAO Wax and wane L lower post cp x first of year.positional not pleuritic or exertional     No obvious day to day or daytime pattern/variability or assoc excess/ purulent sputum or mucus plugs or hemoptysis or   chest tightness, subjective wheeze or overt sinus or hb symptoms.    Also denies any obvious fluctuation of symptoms with weather or environmental changes or other aggravating or alleviating factors except as outlined above   No unusual exposure hx or h/o childhood pna/ asthma or knowledge of premature birth.  Current Allergies, Complete Past Medical History, Past Surgical History, Family History, and Social History were reviewed in [employer] record.  ROS  The following are not active complaints unless bolded Hoarseness, sore throat, dysphagia, dental problems, itching, sneezing,  nasal congestion or discharge of excess mucus or purulent secretions, ear ache,   fever, chills, sweats, unintended wt loss or wt gain, classically pleuritic or exertional cp,  orthopnea pnd or arm/hand swelling  or leg swelling, presyncope, palpitations, abdominal pain, anorexia, nausea, vomiting, diarrhea  or change in bowel habits or change in bladder habits, change in stools or change in urine, dysuria, hematuria,  rash,  arthralgias, visual complaints, headache, numbness, weakness or ataxia or problems with walking(uses 4 pronged cane)  or coordination,  change in mood or  memory.             Outpatient Medications Prior to Visit  Medication Sig Dispense Refill   albuterol  (VENTOLIN  HFA) 108 (90 Base) MCG/ACT inhaler Inhale 2 puffs into the lungs every 6 (six) hours as needed for wheezing or shortness of breath. 8 g 2   amLODipine  (NORVASC ) 10 MG tablet Take 1 tablet (10 mg total) by mouth daily. (Patient taking differently: Take 10 mg by mouth at bedtime.) 90 tablet 1   CREON  12000-38000 units CPEP capsule Take 4 capsules by mouth 2 (two) times daily with a meal.     gabapentin  (NEURONTIN ) 100 MG capsule Take 2 capsules (200 mg total) by mouth at bedtime.     hydrALAZINE  (APRESOLINE ) 25 MG tablet Take 3 tablets (75 mg total) by mouth every 8 (eight) hours. 270 tablet 2   ipratropium-albuterol  (DUONEB) 0.5-2.5 (3) MG/3ML SOLN Take 3 mLs by nebulization 3 (three) times daily. (Patient taking differently: Take 3 mLs by nebulization every 6 (six) hours as needed (For shortness of breath).) 360 mL 1   pantoprazole  (PROTONIX ) 40 MG tablet Take 1 tablet (40 mg total) by mouth 2 (two) times daily before a meal. 60 tablet 0   sevelamer  carbonate (RENVELA ) 800 MG tablet Take 800 mg by mouth 3 (three) times daily with meals.  pantoprazole  (PROTONIX ) 40 MG tablet Take 1 tablet (40 mg total) by mouth daily. 30 tablet 2   No facility-administered medications prior to visit.    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  genotype 2B.  Epclusa  started early 04/2020.     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  Chronic microvascular ischemic changes w parenchymal volume loss. Chronic white matter periventricular microhemorrhage, likely due to htn   Ulcer 1990   gastric ulcer. Ruptured s/p emergency repair      Objective:     BP 130/68 (BP Location: Right Arm, Patient Position: Sitting, Cuff Size: Normal)   Pulse 90   Temp 98.2 F (36.8 C) (Oral)   Ht 5\' 2"  (1.575 m)   Wt 101 lb 3.2 oz (45.9 kg)   SpO2 93%   BMI 18.51 kg/m   SpO2: 93 %amb bf  nad   Vital signs reviewed  10/20/2023  - Note at rest 02 sats  93% on RA   General appearance:    elderly bf nad/ walks with cane      HEENT : Oropharynx  3 bottom teeth     NECK :  without  apparent JVD/ palpable Nodes/TM    LUNGS: no acc muscle use,  Nl contour chest which is clear to A and P bilaterally without cough on insp or exp maneuvers   CV:  RRR  no s3 or murmur or increase in P2, and no edema   ABD:  soft and nontender   MS:  Gait walks with walker   ext warm without deformities Or obvious joint restrictions  calf tenderness, cyanosis or clubbing    SKIN: warm and dry without lesions    NEURO:  alert, approp, nl sensorium with  no motor or cerebellar deficits apparent.    I personally reviewed images and agree with radiology impression as follows:  CXR:   portable 08/20/22  Slt atx changes L base    Assessment   DOE (dyspnea on exertion) Active smoker - 10/20/2023   Walked on RA   x  2  lap(s) =  approx 500  ft  @ slow/slt unsteady cane pace, stopped due to sob / fatigue  with lowest 02 sats 94%   - 10/20/2023  After extensive coaching inhaler device,  effectiveness =    75% with DPI   Most likely this is copd and if so GOLD B or E > try sample of trelegy 100 each am  and approp saba;  Re SABA :  I spent extra time with pt today reviewing appropriate use of albuterol  for prn use on exertion with the following points: 1) saba is for relief of sob that does not improve by walking a slower pace or resting but rather if the pt does not improve after trying this first.  2) If the pt is convinced, as many are, that saba helps recover from activity faster then it's easy to tell if this is the case by re-challenging : ie stop, take the inhaler, then p 5 minutes try the exact same activity (intensity of workload) that just caused the symptoms and see if they are substantially diminished or not after saba 3) if there is an activity that reproducibly causes the symptoms, try the saba 15 min before the activity on alternate days   If in fact the saba really does help, then fine to continue to use it prn but advised may need to look closer at the maintenance regimen being used to achieve better control of airways disease with exertion.   Cigarette smoker Counseled re importance of smoking cessation but did not meet time criteria for separate billing    Low-dose CT lung cancer screening is recommended for patients who are 62-82 years of age with a 20+ pack-year history of smoking and who are currently smoking or quit <=15 years ago. No coughing up blood  No unintentional weight loss of > 15 pounds in the last 6 months - pt is eligible for scanning yearly until age 59 > referred   Discussed in detail all the  indications, usual  risks and alternatives  relative to the benefits with patient who agrees to proceed with Rx and w/u  as outlined.      Each maintenance medication was reviewed in detail including emphasizing most importantly the difference between maintenance and prns and under what circumstances the prns are to be triggered using an action plan format where appropriate.  Total time for H and P, chart review, counseling, reviewing DPI, hfa/ neb  device(s) ,  directly observing portions of ambulatory 02 saturation study/ and generating customized AVS unique to this office visit / same day charting = 60 min new pt eval                      Vernestine Gondola, MD 10/20/2023

## 2023-10-20 ENCOUNTER — Ambulatory Visit: Admitting: Internal Medicine

## 2023-10-20 ENCOUNTER — Encounter: Payer: Self-pay | Admitting: Internal Medicine

## 2023-10-20 VITALS — BP 130/68 | HR 90 | Temp 98.2°F | Ht 62.0 in | Wt 101.2 lb

## 2023-10-20 DIAGNOSIS — R0609 Other forms of dyspnea: Secondary | ICD-10-CM | POA: Diagnosis not present

## 2023-10-20 DIAGNOSIS — F17210 Nicotine dependence, cigarettes, uncomplicated: Secondary | ICD-10-CM | POA: Insufficient documentation

## 2023-10-20 MED ORDER — TRELEGY ELLIPTA 100-62.5-25 MCG/ACT IN AEPB: 1.0000 | INHALATION_SPRAY | Freq: Every day | RESPIRATORY_TRACT | Status: AC

## 2023-10-20 MED ORDER — TRELEGY ELLIPTA 100-62.5-25 MCG/ACT IN AEPB: INHALATION_SPRAY | RESPIRATORY_TRACT | 11 refills | Status: DC

## 2023-10-20 MED ORDER — TRELEGY ELLIPTA 100-62.5-25 MCG/ACT IN AEPB: 1.0000 | INHALATION_SPRAY | Freq: Every day | RESPIRATORY_TRACT | 11 refills | Status: DC

## 2023-10-20 NOTE — Patient Instructions (Addendum)
 My office will be contacting you by phone for referral to lung cancer screening   [phone]) - if you don't hear back from my office within one week,  please call us  back or notify us  thru MyChart and we'll address it right away.   Plan A = Automatic = Always=    trelegy 100 one click each am   Plan B = Backup (to supplement plan A, not to replace it) Only use your albuterol  inhaler as a rescue medication to be used if you can't catch your breath by resting or doing a relaxed purse lip breathing pattern.  - The less you use it, the better it will work when you need it. - Ok to use the inhaler up to 2 puffs  every 4 hours if you must but call for appointment if use goes up over your usual need - Don't leave home without it !!  (think of it like the spare tire for your car)   Plan C = Crisis (instead of Plan B but only if Plan B stops working) - only use your albuterol  nebulizer if you first try Plan B and it fails to help > ok to use the nebulizer up to every 4 hours but if start needing it regularly call for immediate appointment   Also  Ok to try albuterol  15 min before an activity (on alternating days the inhaler or nebulizer)  that you know would usually make you short of breath and see if it makes any difference and if makes none then don't take albuterol  after activity unless you can't catch your breath as this means it's the resting that helps, not the albuterol .  My office will be contacting you by phone for referral to lung cancer screening   [phone]) - if you don't hear back from my office within one week,  please call us  back or notify us  thru MyChart and we'll address it right away.    Please schedule a follow up visit in 3 months but call sooner if needed with pfts on return

## 2023-10-20 NOTE — Assessment & Plan Note (Signed)
 Active smoker - 10/20/2023   Walked on RA   x  2  lap(s) =  approx 500  ft  @ slow/slt unsteady cane pace, stopped due to sob / fatigue  with lowest 02 sats 94%   - 10/20/2023  After extensive coaching inhaler device,  effectiveness =    75% with DPI   Most likely this is copd and if so GOLD B or E > try sample of trelegy 100 each am and approp saba;  Re SABA :  I spent extra time with pt today reviewing appropriate use of albuterol  for prn use on exertion with the following points: 1) saba is for relief of sob that does not improve by walking a slower pace or resting but rather if the pt does not improve after trying this first. 2) If the pt is convinced, as many are, that saba helps recover from activity faster then it's easy to tell if this is the case by re-challenging : ie stop, take the inhaler, then p 5 minutes try the exact same activity (intensity of workload) that just caused the symptoms and see if they are substantially diminished or not after saba 3) if there is an activity that reproducibly causes the symptoms, try the saba 15 min before the activity on alternate days   If in fact the saba really does help, then fine to continue to use it prn but advised may need to look closer at the maintenance regimen being used to achieve better control of airways disease with exertion.

## 2023-10-21 NOTE — Assessment & Plan Note (Signed)
 Counseled re importance of smoking cessation but did not meet time criteria for separate billing    Low-dose CT lung cancer screening is recommended for patients who are 2-75 years of age with a 20+ pack-year history of smoking and who are currently smoking or quit <=15 years ago. No coughing up blood  No unintentional weight loss of > 15 pounds in the last 6 months - pt is eligible for scanning yearly until age 2 > referred   Discussed in detail all the  indications, usual  risks and alternatives  relative to the benefits with patient who agrees to proceed with Rx and w/u  as outlined.      Each maintenance medication was reviewed in detail including emphasizing most importantly the difference between maintenance and prns and under what circumstances the prns are to be triggered using an action plan format where appropriate.  Total time for H and P, chart review, counseling, reviewing DPI, hfa/ neb  device(s) , directly observing portions of ambulatory 02 saturation study/ and generating customized AVS unique to this office visit / same day charting = 60 min new pt eval

## 2023-10-30 ENCOUNTER — Emergency Department (HOSPITAL_COMMUNITY)

## 2023-10-30 ENCOUNTER — Encounter (HOSPITAL_COMMUNITY): Payer: Self-pay

## 2023-10-30 ENCOUNTER — Emergency Department (HOSPITAL_COMMUNITY): Admission: EM | Admit: 2023-10-30 | Discharge: 2023-10-30 | Disposition: A | Discharge status: Home / Self Care

## 2023-10-30 ENCOUNTER — Other Ambulatory Visit: Payer: Self-pay

## 2023-10-30 DIAGNOSIS — Z992 Dependence on renal dialysis: Secondary | ICD-10-CM | POA: Insufficient documentation

## 2023-10-30 DIAGNOSIS — I120 Hypertensive chronic kidney disease with stage 5 chronic kidney disease or end stage renal disease: Secondary | ICD-10-CM | POA: Diagnosis not present

## 2023-10-30 DIAGNOSIS — M79662 Pain in left lower leg: Secondary | ICD-10-CM | POA: Diagnosis present

## 2023-10-30 DIAGNOSIS — M79605 Pain in left leg: Secondary | ICD-10-CM

## 2023-10-30 DIAGNOSIS — Z8673 Personal history of transient ischemic attack (TIA), and cerebral infarction without residual deficits: Secondary | ICD-10-CM | POA: Insufficient documentation

## 2023-10-30 DIAGNOSIS — Z79899 Other long term (current) drug therapy: Secondary | ICD-10-CM | POA: Diagnosis not present

## 2023-10-30 DIAGNOSIS — M25562 Pain in left knee: Secondary | ICD-10-CM | POA: Diagnosis not present

## 2023-10-30 DIAGNOSIS — W19XXXA Unspecified fall, initial encounter: Secondary | ICD-10-CM

## 2023-10-30 DIAGNOSIS — W1839XA Other fall on same level, initial encounter: Secondary | ICD-10-CM | POA: Insufficient documentation

## 2023-10-30 DIAGNOSIS — N186 End stage renal disease: Secondary | ICD-10-CM | POA: Insufficient documentation

## 2023-10-30 MED ORDER — METHOCARBAMOL 500 MG PO TABS
500.0000 mg | ORAL_TABLET | Freq: Once | ORAL | Status: AC
  Administered 2023-10-30: 500 mg via ORAL
  Filled 2023-10-30: qty 1

## 2023-10-30 MED ORDER — METHOCARBAMOL 500 MG PO TABS: 500.0000 mg | ORAL_TABLET | Freq: Two times a day (BID) | ORAL | 0 refills | Status: DC

## 2023-10-30 MED ORDER — HYDROCODONE-ACETAMINOPHEN 5-325 MG PO TABS
1.0000 | ORAL_TABLET | Freq: Once | ORAL | Status: AC
  Administered 2023-10-30: 1 via ORAL
  Filled 2023-10-30: qty 1

## 2023-10-30 MED ORDER — HYDROCODONE-ACETAMINOPHEN 5-325 MG PO TABS: 1.0000 | ORAL_TABLET | Freq: Four times a day (QID) | ORAL | 0 refills | Status: DC | PRN

## 2023-10-30 NOTE — Progress Notes (Signed)
 Orthopedic Tech Progress Note Patient Details:  Ashley [name] 02/22/1951 [id number]  Ortho Devices Type of Ortho Device: Knee Sleeve, ASO Ortho Device/Splint Location: Hinged knee sleeve Ortho Device/Splint Interventions: Ordered, Application, Adjustment   Post Interventions Patient Tolerated: Well Instructions Provided: Care of device, Adjustment of device  [name] 10/30/2023, 3:04 PM

## 2023-10-30 NOTE — ED Triage Notes (Signed)
 Pt here for mechanical fall. Denies LOC and denies hitting head. Denies blood thinners. C/O left knee pain. Pt had full tx of dialysis today.

## 2023-10-30 NOTE — ED Provider Notes (Signed)
 Ashley [name] Provider Note   CSN: [id number] Arrival date & time: 10/30/23  1057     History  Chief Complaint  Patient presents with   Ashley [name]    Ashley [name] is a 73 y.o. female.  Patient with past medical history of substance abuse, hyperlipidemia, CVA, hypertension, end-stage renal disease on dialysis presenting to emergency room today with complaint of fall.  Patient reports that she was ambulating when she lost her balance and fell onto her left knee.  She did not hit her head and denies any headache or neck pain.  Patient is on a blood thinner.  Has not tried to ambulate since injury secondary to pain.  Also reports left foot and left lower leg pain as well, started after fall. Has good ROM of extremity.    Fall       Home Medications Prior to Admission medications   Medication Sig Start Date End Date Taking? Authorizing Provider  albuterol  (VENTOLIN  HFA) 108 (90 Base) MCG/ACT inhaler Inhale 2 puffs into the lungs every 6 (six) hours as needed for wheezing or shortness of breath. 06/20/23   Leona Rake, MD  amLODipine  (NORVASC ) 10 MG tablet Take 1 tablet (10 mg total) by mouth daily. Patient taking differently: Take 10 mg by mouth at bedtime. 11/11/16   Phelps, Jazma Y, DO  CREON  12000-38000 units CPEP capsule Take 4 capsules by mouth 2 (two) times daily with a meal. 03/02/23   [provider]  Fluticasone-Umeclidin-Vilant (TRELEGY ELLIPTA ) 100-62.5-25 MCG/ACT AEPB Inhale 1 puff into the lungs daily. 10/20/23   Wert, Michael B, MD  Fluticasone-Umeclidin-Vilant (TRELEGY ELLIPTA ) 100-62.5-25 MCG/ACT AEPB Inhale 1 puff into the lungs daily. One click each am 10/20/23   Diamond Formica, MD  gabapentin  (NEURONTIN ) 100 MG capsule Take 2 capsules (200 mg total) by mouth at bedtime. 06/20/23   Leona Rake, MD  hydrALAZINE  (APRESOLINE ) 25 MG tablet Take 3 tablets (75 mg total) by mouth every 8 (eight) hours. 11/14/20 03/09/24  [place],  Rema Care, DO  ipratropium-albuterol  (DUONEB) 0.5-2.5 (3) MG/3ML SOLN Take 3 mLs by nebulization 3 (three) times daily. Patient taking differently: Take 3 mLs by nebulization every 6 (six) hours as needed (For shortness of breath). 06/20/23   Leona Rake, MD  pantoprazole  (PROTONIX ) 40 MG tablet Take 1 tablet (40 mg total) by mouth daily. 03/05/23 10/07/23  Quayshaun Hubbert N, PA-C  pantoprazole  (PROTONIX ) 40 MG tablet Take 1 tablet (40 mg total) by mouth 2 (two) times daily before a meal. 08/20/23 08/19/24  Mansouraty, Albino Alu., MD  sevelamer  carbonate (RENVELA ) 800 MG tablet Take 800 mg by mouth 3 (three) times daily with meals. 07/23/20   [provider]      Allergies    Aspirin  and Ibuprofen    Review of Systems   Review of Systems  Musculoskeletal:  Positive for arthralgias.    Physical Exam Updated Vital Signs BP (!) 163/69   Pulse 98   Temp 99 F (37.2 C)   Resp 16   Ht 5\' 2"  (1.575 m)   Wt 46.3 kg   SpO2 92%   BMI 18.66 kg/m  Physical Exam Vitals and nursing note reviewed.  Constitutional:      General: She is not in acute distress.    Appearance: She is not toxic-appearing.  HENT:     Head: Normocephalic and atraumatic.  Eyes:     General: No scleral icterus.    Conjunctiva/sclera: Conjunctivae normal.  Cardiovascular:     Rate and Rhythm: Normal rate and regular rhythm.     Pulses: Normal pulses.     Heart sounds: Normal heart sounds.  Pulmonary:     Effort: Pulmonary effort is normal. No respiratory distress.     Breath sounds: Normal breath sounds.     Comments: No tenderness to palpation over chest wall.  No bruising. Abdominal:     General: Abdomen is flat. Bowel sounds are normal.     Palpations: Abdomen is soft.     Tenderness: There is no abdominal tenderness.     Comments: No tenderness to palpation over abdomen.  No bruising.  Musculoskeletal:     Comments: Mild tenderness to palpation over left anterior knee.  Patient able to flex and  extend knee.  No tenderness over joint line and no obvious swelling or bruising.  Mild tenderness to left anterior lower shin.  No swelling deformity or bruising.  Mild tenderness over left middle fifth metatarsal.  Strong dorsal pedal pulse. No swelling deformity or bruising.  Moves upper extremities without difficulty.  Skin:    General: Skin is warm and dry.     Findings: No lesion.  Neurological:     General: No focal deficit present.     Mental Status: She is alert and oriented to person, place, and time. Mental status is at baseline.     ED Results / Procedures / Treatments   Labs (all labs ordered are listed, but only abnormal results are displayed) Labs Reviewed - No data to display  EKG None  Radiology No results found.  Procedures Procedures    Medications Ordered in ED Medications  HYDROcodone -acetaminophen  (NORCO/VICODIN) 5-325 MG per tablet 1 tablet (1 tablet Oral Given 10/30/23 1235)    ED Course/ Medical Decision Making/ A&P                                 Medical Decision Making Amount and/or Complexity of Data Reviewed Radiology: ordered.  Risk Prescription drug management.   This patient presents to the ED for concern of fall, this involves an extensive number of treatment options, and is a complaint that carries with it a high risk of complications and morbidity.  The differential diagnosis includes intercranial abnormality, cervical fracture, pneumothorax, fracture, strain, sprain    Imaging Studies ordered:  I ordered imaging studies including left knee, tib/fib, foot   I independently visualized and interpreted imaging which showed no acute fracture  I agree with the radiologist interpretation    Problem List / ED Course / Critical interventions / Medication management  Patient reporting to emergency room with complaint of mechanical fall from ground height.  When she fell she did not hit her head or lose consciousness.  Has no  complaint of cervical thoracic or lumbar back pain.  She appears atraumatic.  Vitals are stable.  Primarily complaining of left lower extremity pain from the knee down.  Compartments are soft and she is neurovascularly intact able to move this extremity with pain but without much difficulty.  There is no significant swelling deformity or bruising to the area.  She has sensation intact.  No focal area of tenderness but reports tenderness ovation over anterior knee shin and lateral aspect of foot.  Will obtain imaging to rule out acute fracture.  Will give pain control and reassess.  Trays negative.  Pain has improved.  Will send home with hinged  knee brace and ASO. I ordered medication including Norco Reevaluation of the patient after these medicines showed that the patient improved I have reviewed the patients home medicines and have made adjustments as needed   Plan  F/u w/ PCP in 2-3d to ensure resolution of sx.  Patient was given return precautions. Patient stable for discharge at this time.  Patient educated on sx/dx and verbalized understanding of plan. Return to ER w/ new or worsening sx.          Final Clinical Impression(s) / ED Diagnoses Final diagnoses:  Fall, initial encounter  Left leg pain    Rx / DC Orders ED Discharge Orders          Ordered    HYDROcodone -acetaminophen  (NORCO/VICODIN) 5-325 MG tablet  Every 6 hours PRN        10/30/23 1437    methocarbamol  (ROBAXIN ) 500 MG tablet  2 times daily        10/30/23 1437              Fremon Zacharia, Kandace Organ, PA-C 10/30/23 1512    Carin Charleston, MD 10/30/23 [phone]

## 2023-10-30 NOTE — Discharge Instructions (Addendum)
 Please use cane when ambulating.  I have sent medication for pain control to your pharmacy but please do not drive or operate heavy machinery when taking this medicine.  You can also take 650mg  of Tylenol .  Use ice over area of pain as well.

## 2023-11-17 ENCOUNTER — Ambulatory Visit: Attending: Vascular Surgery | Admitting: Physician Assistant

## 2023-11-17 ENCOUNTER — Other Ambulatory Visit: Payer: Self-pay

## 2023-11-17 VITALS — BP 145/62 | HR 82 | Temp 98.7°F | Resp 18 | Ht 62.0 in | Wt 104.6 lb

## 2023-11-17 DIAGNOSIS — N186 End stage renal disease: Secondary | ICD-10-CM

## 2023-11-17 DIAGNOSIS — T829XXA Unspecified complication of cardiac and vascular prosthetic device, implant and graft, initial encounter: Secondary | ICD-10-CM

## 2023-11-17 NOTE — Progress Notes (Signed)
 Established Dialysis Access   History of Present Illness   Ashley [name] is a 73 y.o. (December 27, 1950) female who presents for evaluation of blister on her left AV fistula. Her fistula was initially created in April of 2019 by Dr. Shirley [name]. She says that about 2 months ago she noticed a small dark spot on her fistula. She explains that over time it has gotten larger. She thought maybe she got hit in the arm. She denies any pain in the area of the dark spot or bleeding however she says the fistula up close to her armpit is very painful especially during dialysis. She says this area also bleeds a lot after dialysis. She reports that last Friday she had to stay 45 minutes after her dialysis to get the bleeding to stop. Otherwise she says she is able to dialyze her normal time without issues. She denies any weakness, numbness or pain in her left hand.   She currently dialyzes on MWF at the [facility] location  Current Outpatient Medications  Medication Sig Dispense Refill   albuterol  (VENTOLIN  HFA) 108 (90 Base) MCG/ACT inhaler Inhale 2 puffs into the lungs every 6 (six) hours as needed for wheezing or shortness of breath. 8 g 2   amLODipine  (NORVASC ) 10 MG tablet Take 1 tablet (10 mg total) by mouth daily. (Patient taking differently: Take 10 mg by mouth at bedtime.) 90 tablet 1   CREON  12000-38000 units CPEP capsule Take 4 capsules by mouth 2 (two) times daily with a meal.     Fluticasone-Umeclidin-Vilant (TRELEGY ELLIPTA ) 100-62.5-25 MCG/ACT AEPB Inhale 1 puff into the lungs daily.     Fluticasone-Umeclidin-Vilant (TRELEGY ELLIPTA ) 100-62.5-25 MCG/ACT AEPB Inhale 1 puff into the lungs daily. One click each am 1 each 11   gabapentin  (NEURONTIN ) 100 MG capsule Take 2 capsules (200 mg total) by mouth at bedtime.     hydrALAZINE  (APRESOLINE ) 25 MG tablet Take 3 tablets (75 mg total) by mouth every 8 (eight) hours. 270 tablet 2   HYDROcodone -acetaminophen  (NORCO/VICODIN) 5-325 MG tablet Take 1 tablet  by mouth every 6 (six) hours as needed for severe pain (pain score 7-10). 10 tablet 0   ipratropium-albuterol  (DUONEB) 0.5-2.5 (3) MG/3ML SOLN Take 3 mLs by nebulization 3 (three) times daily. (Patient taking differently: Take 3 mLs by nebulization every 6 (six) hours as needed (For shortness of breath).) 360 mL 1   methocarbamol  (ROBAXIN ) 500 MG tablet Take 1 tablet (500 mg total) by mouth 2 (two) times daily. 20 tablet 0   pantoprazole  (PROTONIX ) 40 MG tablet Take 1 tablet (40 mg total) by mouth 2 (two) times daily before a meal. 60 tablet 0   sevelamer  carbonate (RENVELA ) 800 MG tablet Take 800 mg by mouth 3 (three) times daily with meals.     pantoprazole  (PROTONIX ) 40 MG tablet Take 1 tablet (40 mg total) by mouth daily. 30 tablet 2   No current facility-administered medications for this visit.    On ROS today: negative unless stated in HPI   Physical Examination   General Thin, pleasant, in no distress  Pulmonary Non labored  Cardiac regular  Vascular  Small ulceration present in mid fistula as shown above. 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 Fistula is very pulsatile  Musculo- skeletal M/S 5/5 throughout  , Extremities without ischemic changes    Neurologic A&O; CN grossly intact    Medical Decision Making   Ashley [name] is a 73 y.o. female who  presents for evaluation of blister on her left AV fistula. Her fistula was initially created in April of 2019 by Dr. Shirley [name]. She says that about 2 months ago she noticed a small dark spot on her fistula. She currently has a small ulceration on her mid fistula. There are aneurysmal areas on either side of the ulceration. The fistula is very pulsatile. She has not had any bleeding from the ulcerated area but she has had bleeding from distal aneurysm. I have recommended hybrid approach to management of this. I will schedule her for a LUE fistulogram as well as revision of the fistula. She should have  adequate length of fistula after revision to not require a TDC but I did discuss that if necessary she may require a temporary TDC for dialysis. She dialyzes on MWF so will try to arrange this in the near future on a non dialysis day. She is a prior patient of Dr. Foster Im so I will arrange her in the OR with the next surgeon who has earliest availability. She knows should she have any bleeding in the interim to present right away to the ER.    Ashley Finical, PA-C [facility] Office: [phone]  Clinic MD: [facility]

## 2023-11-17 NOTE — H&P (View-Only) (Signed)
 Established Dialysis Access   History of Present Illness   Ashley [name] is a 73 y.o. (December 27, 1950) female who presents for evaluation of blister on her left AV fistula. Her fistula was initially created in April of 2019 by Dr. Shirley Douglas. She says that about 2 months ago she noticed a small dark spot on her fistula. She explains that over time it has gotten larger. She thought maybe she got hit in the arm. She denies any pain in the area of the dark spot or bleeding however she says the fistula up close to her armpit is very painful especially during dialysis. She says this area also bleeds a lot after dialysis. She reports that last Friday she had to stay 45 minutes after her dialysis to get the bleeding to stop. Otherwise she says she is able to dialyze her normal time without issues. She denies any weakness, numbness or pain in her left hand.   She currently dialyzes on MWF at the [facility] location  Current Outpatient Medications  Medication Sig Dispense Refill   albuterol  (VENTOLIN  HFA) 108 (90 Base) MCG/ACT inhaler Inhale 2 puffs into the lungs every 6 (six) hours as needed for wheezing or shortness of breath. 8 g 2   amLODipine  (NORVASC ) 10 MG tablet Take 1 tablet (10 mg total) by mouth daily. (Patient taking differently: Take 10 mg by mouth at bedtime.) 90 tablet 1   CREON  12000-38000 units CPEP capsule Take 4 capsules by mouth 2 (two) times daily with a meal.     Fluticasone-Umeclidin-Vilant (TRELEGY ELLIPTA ) 100-62.5-25 MCG/ACT AEPB Inhale 1 puff into the lungs daily.     Fluticasone-Umeclidin-Vilant (TRELEGY ELLIPTA ) 100-62.5-25 MCG/ACT AEPB Inhale 1 puff into the lungs daily. One click each am 1 each 11   gabapentin  (NEURONTIN ) 100 MG capsule Take 2 capsules (200 mg total) by mouth at bedtime.     hydrALAZINE  (APRESOLINE ) 25 MG tablet Take 3 tablets (75 mg total) by mouth every 8 (eight) hours. 270 tablet 2   HYDROcodone -acetaminophen  (NORCO/VICODIN) 5-325 MG tablet Take 1 tablet  by mouth every 6 (six) hours as needed for severe pain (pain score 7-10). 10 tablet 0   ipratropium-albuterol  (DUONEB) 0.5-2.5 (3) MG/3ML SOLN Take 3 mLs by nebulization 3 (three) times daily. (Patient taking differently: Take 3 mLs by nebulization every 6 (six) hours as needed (For shortness of breath).) 360 mL 1   methocarbamol  (ROBAXIN ) 500 MG tablet Take 1 tablet (500 mg total) by mouth 2 (two) times daily. 20 tablet 0   pantoprazole  (PROTONIX ) 40 MG tablet Take 1 tablet (40 mg total) by mouth 2 (two) times daily before a meal. 60 tablet 0   sevelamer  carbonate (RENVELA ) 800 MG tablet Take 800 mg by mouth 3 (three) times daily with meals.     pantoprazole  (PROTONIX ) 40 MG tablet Take 1 tablet (40 mg total) by mouth daily. 30 tablet 2   No current facility-administered medications for this visit.    On ROS today: negative unless stated in HPI   Physical Examination   General Thin, pleasant, in no distress  Pulmonary Non labored  Cardiac regular  Vascular  Small ulceration present in mid fistula as shown above. 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 Fistula is very pulsatile  Musculo- skeletal M/S 5/5 throughout  , Extremities without ischemic changes    Neurologic A&O; CN grossly intact    Medical Decision Making   Ashley [name] is a 73 y.o. female who  presents for evaluation of blister on her left AV fistula. Her fistula was initially created in April of 2019 by Dr. Shirley Douglas. She says that about 2 months ago she noticed a small dark spot on her fistula. She currently has a small ulceration on her mid fistula. There are aneurysmal areas on either side of the ulceration. The fistula is very pulsatile. She has not had any bleeding from the ulcerated area but she has had bleeding from distal aneurysm. I have recommended hybrid approach to management of this. I will schedule her for a LUE fistulogram as well as revision of the fistula. She should have  adequate length of fistula after revision to not require a TDC but I did discuss that if necessary she may require a temporary TDC for dialysis. She dialyzes on MWF so will try to arrange this in the near future on a non dialysis day. She is a prior patient of Dr. Foster Im so I will arrange her in the OR with the next surgeon who has earliest availability. She knows should she have any bleeding in the interim to present right away to the ER.    Deneen Finical, PA-C [facility] Office: [phone]  Clinic MD: [facility]

## 2023-11-18 ENCOUNTER — Encounter (HOSPITAL_COMMUNITY): Payer: Self-pay | Admitting: Vascular Surgery

## 2023-11-18 ENCOUNTER — Other Ambulatory Visit: Payer: Self-pay

## 2023-11-18 NOTE — Progress Notes (Signed)
 PCP - Shannan Dart., FNP  Cardiologist -   PPM/ICD - denies Device Orders - n/a Rep Notified - n/a  Chest x-ray - 08-20-23 EKG - 06-18-23 Stress Test -  ECHO - 11-10-20 Cardiac Cath -   CPAP - denies Dm -denies  Blood Thinner Instructions: denies Aspirin  Instructions: n/a  ERAS Protcol - NPO  COVID TEST- n/a  Anesthesia review: no  Patient verbally denies any shortness of breath, fever, cough and chest pain during phone call   -------------  SDW INSTRUCTIONS given:  Your procedure is scheduled on November 19, 2023.  Report to [facility] Main Entrance A at 9:30 A.M., and check in at the Admitting office.  Call this number if you have problems the morning of surgery:  [phone]   Remember:  Do not eat or drink after midnight the night before your surgery      Take these medicines the morning of surgery with A SIP OF WATER  albuterol  (VENTOLIN  HFA) inhaler  TRELEGY ELLIPTA  hydrALAZINE  (APRESOLINE )  ipratropium-albuterol  (DUONEB)  methocarbamol  (ROBAXIN )  pantoprazole  (PROTONIX )   As of today, STOP taking any Aspirin  (unless otherwise instructed by your surgeon) Aleve , Naproxen , Ibuprofen, Motrin, Advil, Goody's, BC's, all herbal medications, fish oil, and all vitamins.                      Do not wear jewelry, make up, or nail polish            Do not wear lotions, powders, perfumes/colognes, or deodorant.            Do not shave 48 hours prior to surgery.  Men may shave face and neck.            Do not bring valuables to the hospital.            [facility] is not responsible for any belongings or valuables.  Do NOT Smoke (Tobacco/Vaping) 24 hours prior to your procedure If you use a CPAP at night, you may bring all equipment for your overnight stay.   Contacts, glasses, dentures or bridgework may not be worn into surgery.      For patients admitted to the hospital, discharge time will be determined by your treatment team.   Patients discharged the day  of surgery will not be allowed to drive home, and someone needs to stay with them for 24 hours.    Special instructions:   [place]- Preparing For Surgery  Before surgery, you can play an important role. Because skin is not sterile, your skin needs to be as free of germs as possible. You can reduce the number of germs on your skin by washing with CHG (chlorahexidine gluconate) Soap before surgery.  CHG is an antiseptic cleaner which kills germs and bonds with the skin to continue killing germs even after washing.    Oral Hygiene is also important to reduce your risk of infection.  Remember - BRUSH YOUR TEETH THE MORNING OF SURGERY WITH YOUR REGULAR TOOTHPASTE  Please do not use if you have an allergy to CHG or antibacterial soaps. If your skin becomes reddened/irritated stop using the CHG.  Do not shave (including legs and underarms) for at least 48 hours prior to first CHG shower. It is OK to shave your face.  Please follow these instructions carefully.   Shower the NIGHT BEFORE SURGERY and the MORNING OF SURGERY with DIAL  Soap.   Pat yourself dry with a CLEAN TOWEL.  Wear CLEAN PAJAMAS to bed the night before surgery  Place CLEAN SHEETS on your bed the night of your first shower and DO NOT SLEEP WITH PETS.   Day of Surgery: Please shower morning of surgery  Wear Clean/Comfortable clothing the morning of surgery Do not apply any deodorants/lotions.   Remember to brush your teeth WITH YOUR REGULAR TOOTHPASTE.   Questions were answered. Patient verbalized understanding of instructions.

## 2023-11-19 ENCOUNTER — Ambulatory Visit (HOSPITAL_COMMUNITY)

## 2023-11-19 ENCOUNTER — Encounter (HOSPITAL_COMMUNITY): Payer: Self-pay | Admitting: Vascular Surgery

## 2023-11-19 ENCOUNTER — Other Ambulatory Visit: Payer: Self-pay

## 2023-11-19 ENCOUNTER — Ambulatory Visit (HOSPITAL_COMMUNITY)
Admission: RE | Admit: 2023-11-19 | Discharge: 2023-11-19 | Disposition: A | Discharge status: Home / Self Care | Attending: Vascular Surgery | Admitting: Vascular Surgery

## 2023-11-19 ENCOUNTER — Encounter (HOSPITAL_COMMUNITY)
Admission: RE | Disposition: A | Discharge status: Home / Self Care | Payer: Self-pay | Source: Home / Self Care | Attending: Vascular Surgery

## 2023-11-19 DIAGNOSIS — N186 End stage renal disease: Secondary | ICD-10-CM | POA: Diagnosis not present

## 2023-11-19 DIAGNOSIS — Z8673 Personal history of transient ischemic attack (TIA), and cerebral infarction without residual deficits: Secondary | ICD-10-CM | POA: Diagnosis not present

## 2023-11-19 DIAGNOSIS — Y832 Surgical operation with anastomosis, bypass or graft as the cause of abnormal reaction of the patient, or of later complication, without mention of misadventure at the time of the procedure: Secondary | ICD-10-CM | POA: Diagnosis not present

## 2023-11-19 DIAGNOSIS — T82898A Other specified complication of vascular prosthetic devices, implants and grafts, initial encounter: Secondary | ICD-10-CM

## 2023-11-19 DIAGNOSIS — T82858A Stenosis of vascular prosthetic devices, implants and grafts, initial encounter: Secondary | ICD-10-CM | POA: Insufficient documentation

## 2023-11-19 DIAGNOSIS — K219 Gastro-esophageal reflux disease without esophagitis: Secondary | ICD-10-CM | POA: Diagnosis not present

## 2023-11-19 DIAGNOSIS — I120 Hypertensive chronic kidney disease with stage 5 chronic kidney disease or end stage renal disease: Secondary | ICD-10-CM | POA: Insufficient documentation

## 2023-11-19 DIAGNOSIS — J449 Chronic obstructive pulmonary disease, unspecified: Secondary | ICD-10-CM

## 2023-11-19 DIAGNOSIS — Z992 Dependence on renal dialysis: Secondary | ICD-10-CM

## 2023-11-19 DIAGNOSIS — F17200 Nicotine dependence, unspecified, uncomplicated: Secondary | ICD-10-CM | POA: Insufficient documentation

## 2023-11-19 DIAGNOSIS — F17210 Nicotine dependence, cigarettes, uncomplicated: Secondary | ICD-10-CM

## 2023-11-19 HISTORY — DX: Dyspnea, unspecified: R06.00

## 2023-11-19 HISTORY — DX: Chronic obstructive pulmonary disease, unspecified: J44.9

## 2023-11-19 LAB — POCT I-STAT, CHEM 8
BUN: 19 mg/dL (ref 8–23)
Calcium, Ion: 0.91 mmol/L — ABNORMAL LOW (ref 1.15–1.40)
Chloride: 100 mmol/L (ref 98–111)
Creatinine, Ser: 6.4 mg/dL — ABNORMAL HIGH (ref 0.44–1.00)
Glucose, Bld: 87 mg/dL (ref 70–99)
HCT: 33 % — ABNORMAL LOW (ref 36.0–46.0)
Hemoglobin: 11.2 g/dL — ABNORMAL LOW (ref 12.0–15.0)
Potassium: 4.2 mmol/L (ref 3.5–5.1)
Sodium: 135 mmol/L (ref 135–145)
TCO2: 28 mmol/L (ref 22–32)

## 2023-11-19 SURGERY — FISTULOGRAM
Anesthesia: Monitor Anesthesia Care | Site: Arm Upper | Laterality: Left

## 2023-11-19 MED ORDER — HEPARIN 6000 UNIT IRRIGATION SOLUTION
Status: DC | PRN
Start: 2023-11-19 — End: 2023-11-19
  Administered 2023-11-19: 1

## 2023-11-19 MED ORDER — FENTANYL CITRATE (PF) 100 MCG/2ML IJ SOLN
INTRAMUSCULAR | Status: AC
  Filled 2023-11-19: qty 2

## 2023-11-19 MED ORDER — CEFAZOLIN SODIUM-DEXTROSE 2-4 GM/100ML-% IV SOLN
2.0000 g | INTRAVENOUS | Status: AC
  Administered 2023-11-19: 2 g via INTRAVENOUS
  Filled 2023-11-19: qty 100

## 2023-11-19 MED ORDER — FENTANYL CITRATE (PF) 100 MCG/2ML IJ SOLN
INTRAMUSCULAR | Status: DC | PRN
  Administered 2023-11-19 (×2): 50 ug via INTRAVENOUS

## 2023-11-19 MED ORDER — SODIUM CHLORIDE 0.9 % IV SOLN: INTRAVENOUS | Status: DC

## 2023-11-19 MED ORDER — ORAL CARE MOUTH RINSE: 15.0000 mL | Freq: Once | OROMUCOSAL | Status: AC

## 2023-11-19 MED ORDER — PROPOFOL 500 MG/50ML IV EMUL
INTRAVENOUS | Status: DC | PRN
  Administered 2023-11-19: 100 ug/kg/min via INTRAVENOUS

## 2023-11-19 MED ORDER — FENTANYL CITRATE (PF) 250 MCG/5ML IJ SOLN
INTRAMUSCULAR | Status: AC
Start: 2023-11-19 — End: 2023-11-19
  Filled 2023-11-19: qty 5

## 2023-11-19 MED ORDER — BUPIVACAINE HCL (PF) 0.25 % IJ SOLN
INTRAMUSCULAR | Status: DC | PRN
  Administered 2023-11-19: 10 mL

## 2023-11-19 MED ORDER — PROTAMINE SULFATE 10 MG/ML IV SOLN
INTRAVENOUS | Status: AC
  Filled 2023-11-19: qty 5

## 2023-11-19 MED ORDER — ROPIVACAINE HCL 5 MG/ML IJ SOLN
INTRAMUSCULAR | Status: DC | PRN
  Administered 2023-11-19: 25 mL via PERINEURAL

## 2023-11-19 MED ORDER — SODIUM CHLORIDE 0.9% FLUSH: 3.0000 mL | INTRAVENOUS | Status: DC | PRN

## 2023-11-19 MED ORDER — CHLORHEXIDINE GLUCONATE 4 % EX SOLN: 60.0000 mL | Freq: Once | CUTANEOUS | Status: DC

## 2023-11-19 MED ORDER — CHLORHEXIDINE GLUCONATE 0.12 % MT SOLN
15.0000 mL | Freq: Once | OROMUCOSAL | Status: AC
  Administered 2023-11-19: 15 mL via OROMUCOSAL
  Filled 2023-11-19: qty 15

## 2023-11-19 MED ORDER — SODIUM CHLORIDE 0.9 % IV SOLN: INTRAVENOUS | Status: DC | PRN

## 2023-11-19 MED ORDER — IODIXANOL 320 MG/ML IV SOLN
INTRAVENOUS | Status: DC | PRN
  Administered 2023-11-19: 16 mL

## 2023-11-19 MED ORDER — MIDAZOLAM HCL 2 MG/2ML IJ SOLN
INTRAMUSCULAR | Status: AC
  Filled 2023-11-19: qty 2

## 2023-11-19 MED ORDER — FENTANYL CITRATE (PF) 100 MCG/2ML IJ SOLN: 25.0000 ug | INTRAMUSCULAR | Status: DC | PRN

## 2023-11-19 MED ORDER — ACETAMINOPHEN 500 MG PO TABS
1000.0000 mg | ORAL_TABLET | Freq: Once | ORAL | Status: AC
  Administered 2023-11-19: 1000 mg via ORAL
  Filled 2023-11-19: qty 2

## 2023-11-19 SURGICAL SUPPLY — 27 items
ARMBAND PINK RESTRICT EXTREMIT (MISCELLANEOUS) ×2 IMPLANT
BAG COUNTER SPONGE SURGICOUNT (BAG) ×2 IMPLANT
BALLOON MUSTANG 8X20X75 (BALLOONS) ×1 IMPLANT
BENZOIN TINCTURE PRP APPL 2/3 (GAUZE/BANDAGES/DRESSINGS) ×2 IMPLANT
CANISTER SUCTION 3000ML PPV (SUCTIONS) ×2 IMPLANT
CHLORAPREP W/TINT 26 (MISCELLANEOUS) ×2 IMPLANT
CLSR STERI-STRIP ANTIMIC 1/2X4 (GAUZE/BANDAGES/DRESSINGS) ×1 IMPLANT
DRAPE BRACHIAL (DRAPES) ×1 IMPLANT
DRSG TEGADERM 4X4.75 (GAUZE/BANDAGES/DRESSINGS) ×1 IMPLANT
GAUZE SPONGE 4X4 12PLY STRL (GAUZE/BANDAGES/DRESSINGS) ×1 IMPLANT
GLOVE BIO SURGEON STRL SZ8 (GLOVE) ×2 IMPLANT
GOWN STRL REUS W/ TWL LRG LVL3 (GOWN DISPOSABLE) ×4 IMPLANT
GOWN STRL REUS W/ TWL XL LVL3 (GOWN DISPOSABLE) ×2 IMPLANT
KIT BASIN OR (CUSTOM PROCEDURE TRAY) ×2 IMPLANT
KIT TURNOVER KIT B (KITS) ×2 IMPLANT
PACK CV ACCESS (CUSTOM PROCEDURE TRAY) ×2 IMPLANT
PACK ENDO MINOR (CUSTOM PROCEDURE TRAY) ×1 IMPLANT
PAD ARMBOARD POSITIONER FOAM (MISCELLANEOUS) ×4 IMPLANT
SET MICROPUNCTURE 5F STIFF (MISCELLANEOUS) ×1 IMPLANT
SHEATH PINNACLE R/O II 6F 4CM (SHEATH) ×1 IMPLANT
SUT MNCRL AB 4-0 PS2 18 (SUTURE) ×2 IMPLANT
SYR 3ML LL SCALE MARK (SYRINGE) ×2 IMPLANT
TOWEL GREEN STERILE (TOWEL DISPOSABLE) ×2 IMPLANT
TUBING CIL FLEX 10 FLL-RA (TUBING) ×1 IMPLANT
UNDERPAD 30X36 HEAVY ABSORB (UNDERPADS AND DIAPERS) ×2 IMPLANT
WATER STERILE IRR 1000ML POUR (IV SOLUTION) ×2 IMPLANT
WIRE BENTSON .035X145CM (WIRE) ×1 IMPLANT

## 2023-11-19 NOTE — Anesthesia Preprocedure Evaluation (Addendum)
 Anesthesia Evaluation  Patient identified by MRN, date of birth, ID band Patient awake    Reviewed: Allergy & Precautions, H&P , NPO status , Patient's Chart, lab work & pertinent test results  Airway Mallampati: II  TM Distance: >3 FB Neck ROM: Full    Dental no notable dental hx. (+) Edentulous Upper, Partial Lower, Dental Advisory Given   Pulmonary COPD,  COPD inhaler, Current Smoker   Pulmonary exam normal breath sounds clear to auscultation       Cardiovascular hypertension, Pt. on medications + DOE   Rhythm:Regular Rate:Normal     Neuro/Psych CVA  negative psych ROS   GI/Hepatic Neg liver ROS, PUD,GERD  Medicated,,  Endo/Other  negative endocrine ROS    Renal/GU ESRF and DialysisRenal disease  negative genitourinary   Musculoskeletal  (+) Arthritis , Osteoarthritis,    Abdominal   Peds  Hematology  (+) Blood dyscrasia, anemia   Anesthesia Other Findings   Reproductive/Obstetrics negative OB ROS                             Anesthesia Physical Anesthesia Plan  ASA: 3  Anesthesia Plan: MAC and Regional   Post-op Pain Management: Tylenol  PO (pre-op )*   Induction: Intravenous  PONV Risk Score and Plan: 1 and Propofol  infusion  Airway Management Planned: Natural Airway and Simple Face Mask  Additional Equipment:   Intra-op Plan:   Post-operative Plan:   Informed Consent: I have reviewed the patients History and Physical, chart, labs and discussed the procedure including the risks, benefits and alternatives for the proposed anesthesia with the patient or authorized representative who has indicated his/her understanding and acceptance.     Dental advisory given  Plan Discussed with: CRNA  Anesthesia Plan Comments:        Anesthesia Quick Evaluation

## 2023-11-19 NOTE — Discharge Instructions (Signed)

## 2023-11-19 NOTE — Interval H&P Note (Signed)
 History and Physical Interval Note:  11/19/2023 11:57 AM  Ashley [name]  has presented today for surgery, with the diagnosis of ESRD.  The various methods of treatment have been discussed with the patient and family. After consideration of risks, benefits and other options for treatment, the patient has consented to  Procedure(s): REVISON OF ARTERIOVENOUS FISTULA (Left) FISTULOGRAM (Left) as a surgical intervention.  The patient's history has been reviewed, patient examined, no change in status, stable for surgery.  I have reviewed the patient's chart and labs.  Questions were answered to the patient's satisfaction.     Heber Little Leinaala Catanese  Wound has healed. Plan fistulagram only today.

## 2023-11-19 NOTE — Anesthesia Postprocedure Evaluation (Signed)
 Anesthesia Post Note  Patient: Ashley [name]  Procedure(s) Performed: FISTULOGRAM WITH ANGIOPLASTY OF FISTULA (Left: Arm Upper)     Patient location during evaluation: PACU Anesthesia Type: Regional and MAC Level of consciousness: awake and alert Pain management: pain level controlled Vital Signs Assessment: post-procedure vital signs reviewed and stable Respiratory status: spontaneous breathing, nonlabored ventilation and respiratory function stable Cardiovascular status: stable and blood pressure returned to baseline Postop Assessment: no apparent nausea or vomiting Anesthetic complications: no  There were no known notable events for this encounter.  Last Vitals:  Vitals:   11/19/23 1345 11/19/23 1400  BP: (!) 151/67 (!) 155/68  Pulse: 70 70  Resp: 13 14  Temp:    SpO2: 94% 94%    Last Pain:  Vitals:   11/19/23 1317  TempSrc:   PainSc: Asleep                 Neetu Carrozza,W. EDMOND

## 2023-11-19 NOTE — Op Note (Signed)
 DATE OF SERVICE: 11/19/2023  PATIENT:  Ashley [name]  73 y.o. female  PRE-OPERATIVE DIAGNOSIS:  ESRD  POST-OPERATIVE DIAGNOSIS:  Same  PROCEDURE:   1) ultrasound guided left arm fistula access (CPT [id number]) 2) left arm fistulagram 3) left arm fistula angioplasty (8x52mm Mustang) (CPT [phone])  SURGEON:  Surgeons and Role:    * Carlene Che, MD - Primary  ASSISTANT: none  ANESTHESIA:   regional and MAC  EBL: minimal  BLOOD ADMINISTERED:none  DRAINS: none   LOCAL MEDICATIONS USED:  none  SPECIMEN:  none  COUNTS: confirmed correct.  TOURNIQUET:  none  PATIENT DISPOSITION:  PACU - hemodynamically stable.   Delay start of Pharmacological VTE agent (>24hrs) due to surgical blood loss or risk of bleeding: no  INDICATION FOR PROCEDURE: Ashley [name] is a 73 y.o. female with End-stage renal disease.  She presented to our clinic with an ulcerated fistula with pulsatility.  He was offered fistula revision with fistulogram.  In the preoperative area, her ulcer was noted to be resolved, and so I did not recommend proceeding with fistula revision.. After careful discussion of risks, benefits, and alternatives the patient was offered fistulagram. The patient understood and wished to proceed.  OPERATIVE FINDINGS: No central venous stenosis.  No subclavian or axillary vein stenosis.  Half of the fistula into the axillary vein she has a previously stented segment.  There is mild in-stent stenosis at the peripheral edge of the prior stenting.  This resolved with angioplasty with an 8 x 20 mm Mustang balloon.  No stenosis in the fistula.  2 areas of pseudoaneurysm.  The anastomosis appeared fairly healthy.  DESCRIPTION OF PROCEDURE: After identification of the patient in the pre-operative holding area, the patient was transferred to the operating room. The patient was positioned supine on the operating room table. Anesthesia was induced. The left arm was prepped and draped in standard fashion. A  surgical pause was performed confirming correct patient, procedure, and operative location.  Using ultrasound guidance, micropuncture technique was used to obtain access in the left arm fistula.  The access was upsized to a 6 [place] sheath.  Fistulogram was performed in stations.  See above for details.  Angioplasty of the in-stent stenosis was performed with an 8 x 20 Mustang balloon.  Good result was noted.  Follow-up angiogram showed no technical problems.  All endovascular equipment was removed.  A pursestring suture was applied to the access site and this rendered the access hemostatic after removing the sheath.  Upon completion of the case instrument and sharps counts were confirmed correct. The patient was transferred to the PACU in good condition. I was present for all portions of the procedure.  FOLLOW UP PLAN: As needed  Ashley Little. Edgardo Goodwill, MD [facility] Phone Number: [phone] 11/19/2023 1:49 PM

## 2023-11-19 NOTE — Transfer of Care (Signed)
 Immediate Anesthesia Transfer of Care Note  Patient: Ashley [name]  Procedure(s) Performed: FISTULOGRAM WITH ANGIOPLASTY OF FISTULA (Left: Arm Upper)  Patient Location: PACU  Anesthesia Type:MAC  Level of Consciousness: awake, drowsy, and patient cooperative  Airway & Oxygen Therapy: Patient Spontanous Breathing  Post-op Assessment: Report given to RN and Post -op Vital signs reviewed and stable  Post vital signs: Reviewed and stable  Last Vitals:  Vitals Value Taken Time  BP 132/63 11/19/23 13:19  Temp 98.5 11/19/23 1319  Pulse 73 11/19/23 13:19  Resp 15 11/19/23 13:19  SpO2 92 % 11/19/23 13:19  Vitals shown include unfiled device data.  Pt denies pain  Patients Stated Pain Goal: 0 (11/19/23 1000)  Complications: No notable events documented.

## 2023-11-19 NOTE — Anesthesia Procedure Notes (Signed)
 Anesthesia Regional Block: Supraclavicular block   Pre-Anesthetic Checklist: , timeout performed,  Correct Patient, Correct Site, Correct Laterality,  Correct Procedure, Correct Position, site marked,  Risks and benefits discussed,  Pre-op  evaluation,  At surgeon's request and post-op pain management  Laterality: Left  Prep: Maximum Sterile Barrier Precautions used, chloraprep       Needles:  Injection technique: Single-shot  Needle Type: Echogenic Stimulator Needle     Needle Length: 5cm  Needle Gauge: 22     Additional Needles:   Procedures:,,,, ultrasound used (permanent image in chart),,    Narrative:  Start time: 11/19/2023 11:20 AM End time: 11/19/2023 11:30 AM Injection made incrementally with aspirations every 5 mL.  Performed by: Personally  Anesthesiologist: Jake Mayers, MD  Additional Notes:  Intercostobrachial block with 10cc of 0.25% Bupiv plain after sterile prep and drape.

## 2023-11-20 ENCOUNTER — Encounter (HOSPITAL_COMMUNITY): Payer: Self-pay | Admitting: Vascular Surgery

## 2023-12-18 ENCOUNTER — Emergency Department (HOSPITAL_COMMUNITY)

## 2023-12-18 ENCOUNTER — Encounter (HOSPITAL_COMMUNITY): Payer: Self-pay | Admitting: *Deleted

## 2023-12-18 ENCOUNTER — Emergency Department (HOSPITAL_COMMUNITY)
Admission: EM | Admit: 2023-12-18 | Discharge: 2023-12-19 | Disposition: A | Discharge status: Home / Self Care | Attending: Emergency Medicine | Admitting: Emergency Medicine

## 2023-12-18 ENCOUNTER — Other Ambulatory Visit: Payer: Self-pay

## 2023-12-18 DIAGNOSIS — W19XXXA Unspecified fall, initial encounter: Secondary | ICD-10-CM | POA: Insufficient documentation

## 2023-12-18 DIAGNOSIS — I120 Hypertensive chronic kidney disease with stage 5 chronic kidney disease or end stage renal disease: Secondary | ICD-10-CM | POA: Insufficient documentation

## 2023-12-18 DIAGNOSIS — M25511 Pain in right shoulder: Secondary | ICD-10-CM | POA: Insufficient documentation

## 2023-12-18 DIAGNOSIS — N186 End stage renal disease: Secondary | ICD-10-CM | POA: Diagnosis not present

## 2023-12-18 DIAGNOSIS — Z79899 Other long term (current) drug therapy: Secondary | ICD-10-CM | POA: Insufficient documentation

## 2023-12-18 DIAGNOSIS — Y92009 Unspecified place in unspecified non-institutional (private) residence as the place of occurrence of the external cause: Secondary | ICD-10-CM | POA: Diagnosis not present

## 2023-12-18 MED ORDER — OXYCODONE-ACETAMINOPHEN 5-325 MG PO TABS
1.0000 | ORAL_TABLET | Freq: Once | ORAL | Status: AC
  Administered 2023-12-18: 1 via ORAL
  Filled 2023-12-18: qty 1

## 2023-12-18 NOTE — ED Provider Notes (Signed)
 [place] EMERGENCY DEPARTMENT AT [facility] Provider Note   CSN: [id number] Arrival date & time: 12/18/23  1748     Patient presents with: Ashley [name]   AUTIE VASUDEVAN is a 73 y.o. female.   The history is provided by the patient and medical records.  Fall   73 y.o. F with hx of PUD, prior CVA, HTN, ESRD, HLP, presenting to the ED following a fall about 3 hours PTA.  Patient states she was getting ready to leave the house, stood up but only had on 1 shoe and lost her footing and fell on her right side.  No head injury or LOC.  Patient states she seems to have pain mostly in her right shoulder and upper arm.  She feels like her shoulder took the brunt of the fall.  She denies any hip or back pain.  No headache, dizziness, numbness or weakness.  She is not currently on anticoagulation.  She has not had anything for pain prior to arrival.  Prior to Admission medications   Medication Sig Start Date End Date Taking? Authorizing Provider  albuterol  (VENTOLIN  HFA) 108 (90 Base) MCG/ACT inhaler Inhale 2 puffs into the lungs every 6 (six) hours as needed for wheezing or shortness of breath. 06/20/23   Jillian Buttery, MD  amLODipine  (NORVASC ) 10 MG tablet Take 1 tablet (10 mg total) by mouth daily. Patient taking differently: Take 10 mg by mouth at bedtime. 11/11/16   Phelps, Jazma Y, DO  CREON  12000-38000 units CPEP capsule Take 4 capsules by mouth 2 (two) times daily with a meal. 03/02/23   [provider]  Fluticasone-Umeclidin-Vilant (TRELEGY ELLIPTA ) 100-62.5-25 MCG/ACT AEPB Inhale 1 puff into the lungs daily. 10/20/23   Wert, Michael B, MD  Fluticasone-Umeclidin-Vilant (TRELEGY ELLIPTA ) 100-62.5-25 MCG/ACT AEPB Inhale 1 puff into the lungs daily. One click each am 10/20/23   Darlean Ozell NOVAK, MD  gabapentin  (NEURONTIN ) 100 MG capsule Take 2 capsules (200 mg total) by mouth at bedtime. 06/20/23   Jillian Buttery, MD  hydrALAZINE  (APRESOLINE ) 25 MG tablet Take 3 tablets (75 mg total) by  mouth every 8 (eight) hours. 11/14/20 03/09/24  [place], Eric J, DO  HYDROcodone -acetaminophen  (NORCO/VICODIN) 5-325 MG tablet Take 1 tablet by mouth every 6 (six) hours as needed for severe pain (pain score 7-10). 10/30/23   Barrett, Warren SAILOR, PA-C  ipratropium-albuterol  (DUONEB) 0.5-2.5 (3) MG/3ML SOLN Take 3 mLs by nebulization 3 (three) times daily. Patient taking differently: Take 3 mLs by nebulization every 6 (six) hours as needed (For shortness of breath). 06/20/23   Jillian Buttery, MD  methocarbamol  (ROBAXIN ) 500 MG tablet Take 1 tablet (500 mg total) by mouth 2 (two) times daily. 10/30/23   Barrett, Jamie N, PA-C  pantoprazole  (PROTONIX ) 40 MG tablet Take 1 tablet (40 mg total) by mouth daily. 03/05/23 10/07/23  Barrett, Jamie N, PA-C  pantoprazole  (PROTONIX ) 40 MG tablet Take 1 tablet (40 mg total) by mouth 2 (two) times daily before a meal. 08/20/23 08/19/24  Mansouraty, Aloha Raddle., MD  sevelamer  carbonate (RENVELA ) 800 MG tablet Take 800 mg by mouth 3 (three) times daily with meals. 07/23/20   [provider]    Allergies: Aspirin  and Ibuprofen    Review of Systems  Musculoskeletal:  Positive for arthralgias.  All other systems reviewed and are negative.   Updated Vital Signs BP (!) 182/73   Pulse 72   Temp 98.7 F (37.1 C) (Oral)   Resp 16   Ht 5' 2 (1.575 m)  Wt 47.4 kg   SpO2 95%   BMI 19.11 kg/m   Physical Exam Vitals and nursing note reviewed.  Constitutional:      Appearance: She is well-developed.  HENT:     Head: Normocephalic and atraumatic.     Comments: No visible head trauma Eyes:     Conjunctiva/sclera: Conjunctivae normal.     Pupils: Pupils are equal, round, and reactive to light.  Cardiovascular:     Rate and Rhythm: Normal rate and regular rhythm.     Heart sounds: Normal heart sounds.  Pulmonary:     Effort: Pulmonary effort is normal.     Breath sounds: Normal breath sounds.     Comments: Lungs CTAB Chest:     Comments: Chest wall  non-tender Abdominal:     General: Bowel sounds are normal.     Palpations: Abdomen is soft.  Musculoskeletal:        General: Normal range of motion.     Cervical back: Normal range of motion.     Comments: Pain along anterior shoulder and proximal humerus, no deformity noted, radial pulse intact, good grips Pelvis stable, non-tender, no leg shortening, pulses intact BLE  Skin:    General: Skin is warm and dry.  Neurological:     Mental Status: She is alert and oriented to person, place, and time.     Comments: AAOx3, answering questions and following commands appropriately; equal strength UE and LE bilaterally; pain with movement of RUE due to fall, otherwise moving extremities well without difficulty, speech clear and goal oriented     (all labs ordered are listed, but only abnormal results are displayed) Labs Reviewed - No data to display  EKG: None  Radiology: DG Pelvis 1-2 Views Result Date: 12/18/2023 CLINICAL DATA:  Mechanical fall EXAM: PELVIS - 1-2 VIEW COMPARISON:  CT abdomen pelvis 06/17/2023 FINDINGS: No acute fracture or dislocation. Degenerative changes pubic symphysis, both hips, SI joints and lower lumbar spine. IMPRESSION: No acute fracture or dislocation. Electronically Signed   By: Norman Gatlin M.D.   On: 12/18/2023 22:46   DG Humerus Right Result Date: 12/18/2023 CLINICAL DATA:  Fall, right arm injury EXAM: RIGHT HUMERUS - 2+ VIEW COMPARISON:  None Available. FINDINGS: There is no evidence of fracture or other focal bone lesions. Soft tissues are unremarkable. IMPRESSION: Negative. Electronically Signed   By: Dorethia Molt M.D.   On: 12/18/2023 22:46   DG Chest 2 View Result Date: 12/18/2023 CLINICAL DATA:  Mechanical fall EXAM: CHEST - 2 VIEW COMPARISON:  08/20/2023 FINDINGS: Stable cardiomediastinal silhouette. Aortic atherosclerotic calcification. Left midlung scarring. No focal consolidation, pleural effusion, or pneumothorax. No displaced rib fractures.  Vascular stent in the left axilla. IMPRESSION: No active cardiopulmonary disease. Electronically Signed   By: Norman Gatlin M.D.   On: 12/18/2023 22:45   DG Shoulder Right Result Date: 12/18/2023 CLINICAL DATA:  Fall EXAM: RIGHT SHOULDER - 2+ VIEW COMPARISON:  None Available. FINDINGS: There is no evidence of fracture or dislocation. There is no evidence of arthropathy or other focal bone abnormality. Soft tissues are unremarkable. IMPRESSION: Negative. Electronically Signed   By: Greig Pique M.D.   On: 12/18/2023 22:43     Procedures   Medications Ordered in the ED  oxyCODONE -acetaminophen  (PERCOCET/ROXICET) 5-325 MG per tablet 1 tablet (1 tablet Oral Given 12/18/23 2218)  Medical Decision Making Amount and/or Complexity of Data Reviewed Radiology: ordered and independent interpretation performed.  Risk Prescription drug management.   73 year old female presenting to the ED after mechanical fall while only wearing 1 shoe.  Landed lightly on the right shoulder.  No head injury or loss of consciousness.  She is not currently on anticoagulation.    She is AAOx3.  She does not have any deformities or signs of trauma noted on exam.  She reports she was able to walk afterwards.   Pain seems mostly isolated to right shoulder and proximal humerus.   X-rays were obtained, no acute traumatic findings.  Pain is better after single dose of pain medication here.  She requested a sling for her arm which has been ordered.  She is eager to go home which I think is appropriate.  Can continue Tylenol  and Motrin as needed for pain.  Can follow-up with orthopedics if any ongoing issues.  Could also follow-up with PCP. Can return here for new concerns.  Final diagnoses:  Fall, initial encounter  Acute pain of right shoulder    ED Discharge Orders     None          Jarold Olam HERO, PA-C 12/18/23 2349    Ruthe Cornet, DO 12/20/23 1525

## 2023-12-18 NOTE — Discharge Instructions (Signed)
 Can wear sling for now to help with arm pain. Tylenol  as needed for pain. Can follow-up with Dr. Celena if ongoing issues with shoulder. Can also follow-up with your primary care doctor. Return here for new concerns.

## 2023-12-18 NOTE — ED Triage Notes (Signed)
 The pt fell 3 hours ago   c/o pain in her entire rt side of her body  no loc

## 2023-12-18 NOTE — ED Triage Notes (Signed)
 Pt to ED c/o mechanical fall that occurred aprox 2 hours ago. Reports she tripped because was wearing only one shoe , reports landed on right side and now having right side pain. Not on blood thinner. Denies LOC. Did not hit head.

## 2023-12-19 NOTE — Progress Notes (Signed)
 Orthopedic Tech Progress Note Patient Details:  Ashley [name] 1950-07-08 [id number]  Ortho Devices Type of Ortho Device: Shoulder immobilizer Ortho Device/Splint Location: RUE Ortho Device/Splint Interventions: Ordered, Application, Adjustment   Post Interventions Patient Tolerated: Well Instructions Provided: Care of device   [name] L [name] 12/19/2023, 12:20 AM

## 2023-12-21 ENCOUNTER — Emergency Department (HOSPITAL_COMMUNITY)

## 2023-12-21 ENCOUNTER — Emergency Department (HOSPITAL_COMMUNITY)
Admission: EM | Admit: 2023-12-21 | Discharge: 2023-12-21 | Disposition: A | Discharge status: Home / Self Care | Attending: Emergency Medicine | Admitting: Emergency Medicine

## 2023-12-21 ENCOUNTER — Encounter (HOSPITAL_COMMUNITY): Payer: Self-pay

## 2023-12-21 DIAGNOSIS — N186 End stage renal disease: Secondary | ICD-10-CM | POA: Diagnosis not present

## 2023-12-21 DIAGNOSIS — R5383 Other fatigue: Secondary | ICD-10-CM | POA: Diagnosis present

## 2023-12-21 DIAGNOSIS — E875 Hyperkalemia: Secondary | ICD-10-CM | POA: Diagnosis not present

## 2023-12-21 LAB — COMPREHENSIVE METABOLIC PANEL WITH GFR
ALT: 7 U/L (ref 0–44)
AST: 13 U/L — ABNORMAL LOW (ref 15–41)
Albumin: 3.5 g/dL (ref 3.5–5.0)
Alkaline Phosphatase: 64 U/L (ref 38–126)
Anion gap: 16 — ABNORMAL HIGH (ref 5–15)
BUN: 41 mg/dL — ABNORMAL HIGH (ref 8–23)
CO2: 24 mmol/L (ref 22–32)
Calcium: 10.1 mg/dL (ref 8.9–10.3)
Chloride: 93 mmol/L — ABNORMAL LOW (ref 98–111)
Creatinine, Ser: 10.57 mg/dL — ABNORMAL HIGH (ref 0.44–1.00)
GFR, Estimated: 3 mL/min — ABNORMAL LOW (ref >60)
Glucose, Bld: 84 mg/dL (ref 70–99)
Potassium: 5.9 mmol/L — ABNORMAL HIGH (ref 3.5–5.1)
Sodium: 133 mmol/L — ABNORMAL LOW (ref 135–145)
Total Bilirubin: 0.8 mg/dL (ref 0.0–1.2)
Total Protein: 7 g/dL (ref 6.5–8.1)

## 2023-12-21 LAB — CBC WITH DIFFERENTIAL/PLATELET
Abs Immature Granulocytes: 0.03 K/uL (ref 0.00–0.07)
Basophils Absolute: 0.1 K/uL (ref 0.0–0.1)
Basophils Relative: 1 %
Eosinophils Absolute: 0.2 K/uL (ref 0.0–0.5)
Eosinophils Relative: 3 %
HCT: 34.7 % — ABNORMAL LOW (ref 36.0–46.0)
Hemoglobin: 10.8 g/dL — ABNORMAL LOW (ref 12.0–15.0)
Immature Granulocytes: 0 %
Lymphocytes Relative: 13 %
Lymphs Abs: 0.9 K/uL (ref 0.7–4.0)
MCH: 28.5 pg (ref 26.0–34.0)
MCHC: 31.1 g/dL (ref 30.0–36.0)
MCV: 91.6 fL (ref 80.0–100.0)
Monocytes Absolute: 0.6 K/uL (ref 0.1–1.0)
Monocytes Relative: 9 %
Neutro Abs: 5.1 K/uL (ref 1.7–7.7)
Neutrophils Relative %: 74 %
Platelets: 296 K/uL (ref 150–400)
RBC: 3.79 MIL/uL — ABNORMAL LOW (ref 3.87–5.11)
RDW: 15.6 % — ABNORMAL HIGH (ref 11.5–15.5)
WBC: 6.9 K/uL (ref 4.0–10.5)
nRBC: 0 % (ref 0.0–0.2)

## 2023-12-21 MED ORDER — HYDRALAZINE HCL 25 MG PO TABS
25.0000 mg | ORAL_TABLET | Freq: Once | ORAL | Status: AC
  Administered 2023-12-21: 25 mg via ORAL
  Filled 2023-12-21: qty 1

## 2023-12-21 MED ORDER — SODIUM ZIRCONIUM CYCLOSILICATE 10 G PO PACK
10.0000 g | PACK | ORAL | Status: AC
  Administered 2023-12-21: 10 g via ORAL
  Filled 2023-12-21: qty 1

## 2023-12-21 NOTE — ED Notes (Signed)
 Patient's family requesting to be discharged. Pt still set to be AMA per MD. This RN went to discuss this with patient and family and family was gone. Patient continues to refuse signing AMA but states she wants to go home.

## 2023-12-21 NOTE — ED Notes (Signed)
 Pt asking to leave AMA. MD at bedside. Pt still wanting to leave after counseling of risks with leaving AMA.

## 2023-12-21 NOTE — ED Notes (Signed)
 Family and MD at bedside.

## 2023-12-21 NOTE — ED Notes (Signed)
 Patient refusing to answer orientation questions.

## 2023-12-21 NOTE — ED Notes (Signed)
 Patient upset and refusing to sign AMA form. Patient says her daughter is coming up here to get her. Pt provided phone to call daughter.

## 2023-12-21 NOTE — ED Notes (Signed)
 Pt to xray

## 2023-12-21 NOTE — ED Notes (Signed)
 Pt refused vitals including temp.

## 2023-12-21 NOTE — ED Triage Notes (Addendum)
 Pt bib EMS for reeval after fall Friday. Pt is complaining of R shoulder pain. Increased fatigue per daughter. EMS noted rhonchi on auscultation. Dialysis MWF, last had dialysis Friday. Was supposed to go to dialysis today and has not gone yet. Uncertain if she took her meds this morning. RA sat 88%.    BP 200/90. CBG 88.

## 2023-12-21 NOTE — ED Notes (Signed)
Family called to pick up patient

## 2023-12-21 NOTE — ED Notes (Signed)
 ED Provider at bedside.

## 2023-12-21 NOTE — ED Provider Notes (Signed)
 Patient seen by prior physician for missed dialysis and hypertensive, requiring oxygen with elevated potassium.  She is declining admission.  She was signed out to me and is leaving AGAINST MEDICAL ADVICE. Physical Exam  BP (!) 186/69   Pulse 73   Temp 99.4 F (37.4 C) (Axillary)   Resp 12   Ht 5' 2 (1.575 m)   Wt 46.5 kg   SpO2 94%   BMI 18.75 kg/m   Physical Exam  Procedures  Procedures  ED Course / MDM    Medical Decision Making Amount and/or Complexity of Data Reviewed Labs: ordered. Radiology: ordered.  Risk Prescription drug management.   Patient does not want to sign out AMA but wishes to be discharged.  She currently does not have an oxygen requirement.  She has received some Lokelma .  Family is here to pick her up.  I have changed her disposition to discharge from [facility].       Towana Ozell BROCKS, MD 12/22/23 [phone]

## 2023-12-21 NOTE — ED Provider Notes (Addendum)
 [place] EMERGENCY DEPARTMENT AT [facility] Provider Note   CSN: [id number] Arrival date & time: 12/21/23  1000     Patient presents with: Ashley [name]   Ashley [name] is a 73 y.o. female.   73 year old female here due to increasing weakness.  Patient seen 3 days ago after a fall.  Had pain in her right shoulder and x-rays were done which did not show any fracture.  Since that time, she has noted increasing fatigue.  She has not had any chest pain.  No weakness in her right hand.  No discomfort from the waist down.  Patient has a history of ESRD and was last dialyzed on Friday.  Was supposed to be dialyzed today but did not go due to increasing weakness.  Patient notes some increasing dyspnea.  EMS was called and patient's room air sat was found to be 88%.  Placed on oxygen and transported here.  CBG was 88.       Prior to Admission medications   Medication Sig Start Date End Date Taking? Authorizing Provider  albuterol  (VENTOLIN  HFA) 108 (90 Base) MCG/ACT inhaler Inhale 2 puffs into the lungs every 6 (six) hours as needed for wheezing or shortness of breath. 06/20/23   Jillian Buttery, MD  amLODipine  (NORVASC ) 10 MG tablet Take 1 tablet (10 mg total) by mouth daily. Patient taking differently: Take 10 mg by mouth at bedtime. 11/11/16   Phelps, Jazma Y, DO  CREON  12000-38000 units CPEP capsule Take 4 capsules by mouth 2 (two) times daily with a meal. 03/02/23   [provider]  Fluticasone-Umeclidin-Vilant (TRELEGY ELLIPTA ) 100-62.5-25 MCG/ACT AEPB Inhale 1 puff into the lungs daily. 10/20/23   Wert, Michael B, MD  Fluticasone-Umeclidin-Vilant (TRELEGY ELLIPTA ) 100-62.5-25 MCG/ACT AEPB Inhale 1 puff into the lungs daily. One click each am 10/20/23   Darlean Ozell NOVAK, MD  gabapentin  (NEURONTIN ) 100 MG capsule Take 2 capsules (200 mg total) by mouth at bedtime. 06/20/23   Jillian Buttery, MD  hydrALAZINE  (APRESOLINE ) 25 MG tablet Take 3 tablets (75 mg total) by mouth every 8 (eight)  hours. 11/14/20 03/09/24  [place], Eric J, DO  HYDROcodone -acetaminophen  (NORCO/VICODIN) 5-325 MG tablet Take 1 tablet by mouth every 6 (six) hours as needed for severe pain (pain score 7-10). 10/30/23   Barrett, Warren SAILOR, PA-C  ipratropium-albuterol  (DUONEB) 0.5-2.5 (3) MG/3ML SOLN Take 3 mLs by nebulization 3 (three) times daily. Patient taking differently: Take 3 mLs by nebulization every 6 (six) hours as needed (For shortness of breath). 06/20/23   Jillian Buttery, MD  methocarbamol  (ROBAXIN ) 500 MG tablet Take 1 tablet (500 mg total) by mouth 2 (two) times daily. 10/30/23   Barrett, Jamie N, PA-C  pantoprazole  (PROTONIX ) 40 MG tablet Take 1 tablet (40 mg total) by mouth daily. 03/05/23 10/07/23  Barrett, Jamie N, PA-C  pantoprazole  (PROTONIX ) 40 MG tablet Take 1 tablet (40 mg total) by mouth 2 (two) times daily before a meal. 08/20/23 08/19/24  Mansouraty, Aloha Raddle., MD  sevelamer  carbonate (RENVELA ) 800 MG tablet Take 800 mg by mouth 3 (three) times daily with meals. 07/23/20   [provider]    Allergies: Aspirin  and Ibuprofen    Review of Systems  All other systems reviewed and are negative.   Updated Vital Signs BP (!) 203/71 (BP Location: Right Arm)   Pulse 74   Resp 15   Ht 1.575 m (5' 2)   Wt 46.5 kg   SpO2 97%   BMI 18.75 kg/m  Physical Exam Vitals and nursing note reviewed.  Constitutional:      General: She is not in acute distress.    Appearance: Normal appearance. She is well-developed. She is not toxic-appearing.  HENT:     Head: Normocephalic and atraumatic.  Eyes:     General: Lids are normal.     Conjunctiva/sclera: Conjunctivae normal.     Pupils: Pupils are equal, round, and reactive to light.  Neck:     Thyroid : No thyroid  mass.     Trachea: No tracheal deviation.  Cardiovascular:     Rate and Rhythm: Normal rate and regular rhythm.     Heart sounds: Normal heart sounds. No murmur heard.    No gallop.  Pulmonary:     Effort: Pulmonary effort is  normal. No respiratory distress.     Breath sounds: Normal breath sounds. No stridor. No decreased breath sounds, wheezing, rhonchi or rales.  Abdominal:     General: There is no distension.     Palpations: Abdomen is soft.     Tenderness: There is no abdominal tenderness. There is no rebound.  Musculoskeletal:     Right shoulder: Tenderness present. Decreased range of motion.     Cervical back: Normal range of motion and neck supple.     Comments: Neurovascular status intact right hand  Skin:    General: Skin is warm and dry.     Findings: No abrasion or rash.  Neurological:     Mental Status: She is alert and oriented to person, place, and time. Mental status is at baseline.     GCS: GCS eye subscore is 4. GCS verbal subscore is 5. GCS motor subscore is 6.     Cranial Nerves: No cranial nerve deficit.     Sensory: No sensory deficit.     Motor: Motor function is intact.  Psychiatric:        Attention and Perception: Attention normal.        Speech: Speech normal.        Behavior: Behavior normal.     (all labs ordered are listed, but only abnormal results are displayed) Labs Reviewed  CBC WITH DIFFERENTIAL/PLATELET  COMPREHENSIVE METABOLIC PANEL WITH GFR    EKG: EKG Interpretation Date/Time:  Monday December 21 2023 10:22:56 EDT Ventricular Rate:  71 PR Interval:  95 QRS Duration:  83 QT Interval:  432 QTC Calculation: 470 R Axis:   -3  Text Interpretation: Ectopic atrial rhythm Short PR interval Repol abnrm suggests ischemia, lateral leads No significant change since last tracing Confirmed by Dasie Faden ([id number]) on 12/21/2023 10:25:51 AM  Radiology: No results found.   Procedures   Medications Ordered in the ED - No data to display                                  Medical Decision Making Amount and/or Complexity of Data Reviewed Labs: ordered. Radiology: ordered.  Risk Prescription drug management.   Patient is EKG noted and without signs of acute  ischemic changes.  Repeat right shoulder films shows no acute findings.  Labs significant for chronic renal failure with elevated potassium here.  Patient also hypertensive.  For that she needs to be dialyzed as she does have an oxygen requirement.  Her chest x-ray however did not show any evidence of fluid overload.  Plan was to get patient mid for further evaluation but she was decided to leave AMA  at this time.  Will give patient dose of Lokelma  before she goes she will take it.  Patient strongly encouraged to return if she changes her mind.  Risk of leaving AMA explained to her and she understands this and has capacity to make this decision   2:50 PM Family at bedside including her daughter.  Had discussion with them from the patient about the risk of leaving AMA.  Patient initially refused to take Lokelma  that I ordered but now she is agreeable.  Strongly encouraged to return if she changes her mind  CRITICAL CARE Performed by: Curtistine ONEIDA Dawn Total critical care time: 50 minutes Critical care time was exclusive of separately billable procedures and treating other patients. Critical care was necessary to treat or prevent imminent or life-threatening deterioration. Critical care was time spent personally by me on the following activities: development of treatment plan with patient and/or surrogate as well as nursing, discussions with consultants, evaluation of patient's response to treatment, examination of patient, obtaining history from patient or surrogate, ordering and performing treatments and interventions, ordering and review of laboratory studies, ordering and review of radiographic studies, pulse oximetry and re-evaluation of patient's condition.     Final diagnoses:  None    ED Discharge Orders     None          Dawn Curtistine, MD 12/21/23 1309    Dawn Curtistine, MD 12/21/23 1451

## 2023-12-22 ENCOUNTER — Encounter (HOSPITAL_COMMUNITY): Payer: Self-pay

## 2023-12-22 ENCOUNTER — Emergency Department (HOSPITAL_COMMUNITY)

## 2023-12-22 ENCOUNTER — Other Ambulatory Visit: Payer: Self-pay

## 2023-12-22 ENCOUNTER — Observation Stay (HOSPITAL_COMMUNITY)

## 2023-12-22 ENCOUNTER — Observation Stay (HOSPITAL_COMMUNITY)
Admission: EM | Admit: 2023-12-22 | Discharge: 2023-12-24 | Disposition: A | Discharge status: Home / Self Care | Attending: Internal Medicine | Admitting: Internal Medicine

## 2023-12-22 DIAGNOSIS — F0390 Unspecified dementia without behavioral disturbance: Secondary | ICD-10-CM | POA: Diagnosis not present

## 2023-12-22 DIAGNOSIS — I132 Hypertensive heart and chronic kidney disease with heart failure and with stage 5 chronic kidney disease, or end stage renal disease: Secondary | ICD-10-CM | POA: Diagnosis not present

## 2023-12-22 DIAGNOSIS — Z8673 Personal history of transient ischemic attack (TIA), and cerebral infarction without residual deficits: Secondary | ICD-10-CM | POA: Diagnosis not present

## 2023-12-22 DIAGNOSIS — M25511 Pain in right shoulder: Secondary | ICD-10-CM | POA: Insufficient documentation

## 2023-12-22 DIAGNOSIS — F22 Delusional disorders: Principal | ICD-10-CM | POA: Insufficient documentation

## 2023-12-22 DIAGNOSIS — K861 Other chronic pancreatitis: Secondary | ICD-10-CM | POA: Diagnosis present

## 2023-12-22 DIAGNOSIS — J9601 Acute respiratory failure with hypoxia: Secondary | ICD-10-CM | POA: Insufficient documentation

## 2023-12-22 DIAGNOSIS — E875 Hyperkalemia: Secondary | ICD-10-CM | POA: Diagnosis not present

## 2023-12-22 DIAGNOSIS — D649 Anemia, unspecified: Secondary | ICD-10-CM | POA: Diagnosis not present

## 2023-12-22 DIAGNOSIS — Z79899 Other long term (current) drug therapy: Secondary | ICD-10-CM | POA: Diagnosis not present

## 2023-12-22 DIAGNOSIS — N186 End stage renal disease: Principal | ICD-10-CM

## 2023-12-22 DIAGNOSIS — J9691 Respiratory failure, unspecified with hypoxia: Secondary | ICD-10-CM | POA: Diagnosis present

## 2023-12-22 DIAGNOSIS — R5381 Other malaise: Secondary | ICD-10-CM | POA: Diagnosis present

## 2023-12-22 DIAGNOSIS — J9620 Acute and chronic respiratory failure, unspecified whether with hypoxia or hypercapnia: Secondary | ICD-10-CM

## 2023-12-22 DIAGNOSIS — Z992 Dependence on renal dialysis: Secondary | ICD-10-CM | POA: Diagnosis not present

## 2023-12-22 DIAGNOSIS — I120 Hypertensive chronic kidney disease with stage 5 chronic kidney disease or end stage renal disease: Secondary | ICD-10-CM | POA: Insufficient documentation

## 2023-12-22 DIAGNOSIS — R0602 Shortness of breath: Secondary | ICD-10-CM | POA: Diagnosis present

## 2023-12-22 DIAGNOSIS — I10 Essential (primary) hypertension: Secondary | ICD-10-CM | POA: Diagnosis present

## 2023-12-22 DIAGNOSIS — Z7982 Long term (current) use of aspirin: Secondary | ICD-10-CM | POA: Insufficient documentation

## 2023-12-22 DIAGNOSIS — Z8711 Personal history of peptic ulcer disease: Secondary | ICD-10-CM | POA: Diagnosis not present

## 2023-12-22 DIAGNOSIS — J449 Chronic obstructive pulmonary disease, unspecified: Secondary | ICD-10-CM

## 2023-12-22 DIAGNOSIS — R410 Disorientation, unspecified: Secondary | ICD-10-CM | POA: Insufficient documentation

## 2023-12-22 DIAGNOSIS — E162 Hypoglycemia, unspecified: Secondary | ICD-10-CM | POA: Diagnosis not present

## 2023-12-22 DIAGNOSIS — F17210 Nicotine dependence, cigarettes, uncomplicated: Secondary | ICD-10-CM | POA: Diagnosis not present

## 2023-12-22 DIAGNOSIS — I5022 Chronic systolic (congestive) heart failure: Secondary | ICD-10-CM | POA: Diagnosis not present

## 2023-12-22 DIAGNOSIS — R059 Cough, unspecified: Secondary | ICD-10-CM | POA: Insufficient documentation

## 2023-12-22 DIAGNOSIS — F29 Unspecified psychosis not due to a substance or known physiological condition: Principal | ICD-10-CM | POA: Insufficient documentation

## 2023-12-22 DIAGNOSIS — F0392 Unspecified dementia, unspecified severity, with psychotic disturbance: Secondary | ICD-10-CM | POA: Insufficient documentation

## 2023-12-22 HISTORY — DX: Chronic obstructive pulmonary disease, unspecified: J44.9

## 2023-12-22 LAB — RESP PANEL BY RT-PCR (RSV, FLU A&B, COVID)  RVPGX2
Influenza A by PCR: NEGATIVE
Influenza B by PCR: NEGATIVE
Resp Syncytial Virus by PCR: NEGATIVE
SARS Coronavirus 2 by RT PCR: NEGATIVE

## 2023-12-22 LAB — CBC WITH DIFFERENTIAL/PLATELET
Abs Immature Granulocytes: 0.03 K/uL (ref 0.00–0.07)
Basophils Absolute: 0.1 K/uL (ref 0.0–0.1)
Basophils Relative: 1 %
Eosinophils Absolute: 0.2 K/uL (ref 0.0–0.5)
Eosinophils Relative: 3 %
HCT: 34 % — ABNORMAL LOW (ref 36.0–46.0)
Hemoglobin: 11.2 g/dL — ABNORMAL LOW (ref 12.0–15.0)
Immature Granulocytes: 0 %
Lymphocytes Relative: 14 %
Lymphs Abs: 1.1 K/uL (ref 0.7–4.0)
MCH: 29.9 pg (ref 26.0–34.0)
MCHC: 32.9 g/dL (ref 30.0–36.0)
MCV: 90.9 fL (ref 80.0–100.0)
Monocytes Absolute: 0.9 K/uL (ref 0.1–1.0)
Monocytes Relative: 11 %
Neutro Abs: 5.5 K/uL (ref 1.7–7.7)
Neutrophils Relative %: 71 %
Platelets: 286 K/uL (ref 150–400)
RBC: 3.74 MIL/uL — ABNORMAL LOW (ref 3.87–5.11)
RDW: 15.6 % — ABNORMAL HIGH (ref 11.5–15.5)
WBC: 7.7 K/uL (ref 4.0–10.5)
nRBC: 0 % (ref 0.0–0.2)

## 2023-12-22 LAB — GLUCOSE, CAPILLARY: Glucose-Capillary: 79 mg/dL (ref 70–99)

## 2023-12-22 LAB — VITAMIN B12: Vitamin B-12: 781 pg/mL (ref 180–914)

## 2023-12-22 LAB — COMPREHENSIVE METABOLIC PANEL WITH GFR
ALT: 8 U/L (ref 0–44)
AST: 16 U/L (ref 15–41)
Albumin: 3.7 g/dL (ref 3.5–5.0)
Alkaline Phosphatase: 66 U/L (ref 38–126)
Anion gap: 18 — ABNORMAL HIGH (ref 5–15)
BUN: 55 mg/dL — ABNORMAL HIGH (ref 8–23)
CO2: 26 mmol/L (ref 22–32)
Calcium: 10.3 mg/dL (ref 8.9–10.3)
Chloride: 91 mmol/L — ABNORMAL LOW (ref 98–111)
Creatinine, Ser: 12.27 mg/dL — ABNORMAL HIGH (ref 0.44–1.00)
GFR, Estimated: 3 mL/min — ABNORMAL LOW (ref >60)
Glucose, Bld: 69 mg/dL — ABNORMAL LOW (ref 70–99)
Potassium: 6.5 mmol/L (ref 3.5–5.1)
Sodium: 135 mmol/L (ref 135–145)
Total Bilirubin: 1 mg/dL (ref 0.0–1.2)
Total Protein: 7.5 g/dL (ref 6.5–8.1)

## 2023-12-22 LAB — BRAIN NATRIURETIC PEPTIDE: B Natriuretic Peptide: 1862.1 pg/mL — ABNORMAL HIGH (ref 0.0–100.0)

## 2023-12-22 LAB — HIV ANTIBODY (ROUTINE TESTING W REFLEX): HIV Screen 4th Generation wRfx: NONREACTIVE

## 2023-12-22 LAB — HEPATITIS B SURFACE ANTIGEN: Hepatitis B Surface Ag: NONREACTIVE

## 2023-12-22 LAB — TSH: TSH: 5.531 u[IU]/mL — ABNORMAL HIGH (ref 0.350–4.500)

## 2023-12-22 LAB — PHOSPHORUS: Phosphorus: 4.3 mg/dL (ref 2.5–4.6)

## 2023-12-22 LAB — MAGNESIUM: Magnesium: 2.1 mg/dL (ref 1.7–2.4)

## 2023-12-22 LAB — ETHANOL: Alcohol, Ethyl (B): <15 mg/dL (ref <15)

## 2023-12-22 MED ORDER — HYDRALAZINE HCL 25 MG PO TABS
75.0000 mg | ORAL_TABLET | Freq: Once | ORAL | Status: AC
  Administered 2023-12-22: 75 mg via ORAL
  Filled 2023-12-22: qty 3

## 2023-12-22 MED ORDER — OLANZAPINE 5 MG PO TABS: 5.0000 mg | ORAL_TABLET | Freq: Four times a day (QID) | ORAL | Status: DC | PRN

## 2023-12-22 MED ORDER — SEVELAMER CARBONATE 800 MG PO TABS
800.0000 mg | ORAL_TABLET | Freq: Three times a day (TID) | ORAL | Status: DC
  Administered 2023-12-23 – 2023-12-24 (×4): 800 mg via ORAL
  Filled 2023-12-22 (×5): qty 1

## 2023-12-22 MED ORDER — LIDOCAINE-PRILOCAINE 2.5-2.5 % EX CREA: 1.0000 | TOPICAL_CREAM | CUTANEOUS | Status: DC | PRN

## 2023-12-22 MED ORDER — HEPARIN SODIUM (PORCINE) 1000 UNIT/ML DIALYSIS
1000.0000 [IU] | INTRAMUSCULAR | Status: DC | PRN
Start: 2023-12-22 — End: 2023-12-24

## 2023-12-22 MED ORDER — BUDESON-GLYCOPYRROL-FORMOTEROL 160-9-4.8 MCG/ACT IN AERO
2.0000 | INHALATION_SPRAY | Freq: Two times a day (BID) | RESPIRATORY_TRACT | Status: DC
  Administered 2023-12-23 – 2023-12-24 (×2): 2 via RESPIRATORY_TRACT
  Filled 2023-12-22: qty 5.9

## 2023-12-22 MED ORDER — PANCRELIPASE (LIP-PROT-AMYL) 36000-114000 UNITS PO CPEP
48000.0000 [IU] | ORAL_CAPSULE | Freq: Two times a day (BID) | ORAL | Status: DC
  Administered 2023-12-23 – 2023-12-24 (×2): 48000 [IU] via ORAL
  Filled 2023-12-22 (×3): qty 1

## 2023-12-22 MED ORDER — LIDOCAINE HCL (PF) 1 % IJ SOLN: 5.0000 mL | INTRAMUSCULAR | Status: DC | PRN

## 2023-12-22 MED ORDER — SODIUM ZIRCONIUM CYCLOSILICATE 10 G PO PACK
10.0000 g | PACK | Freq: Once | ORAL | Status: DC
  Filled 2023-12-22: qty 1

## 2023-12-22 MED ORDER — PANTOPRAZOLE SODIUM 40 MG PO TBEC
40.0000 mg | DELAYED_RELEASE_TABLET | Freq: Every day | ORAL | Status: DC
  Administered 2023-12-23 – 2023-12-24 (×2): 40 mg via ORAL
  Filled 2023-12-22 (×2): qty 1

## 2023-12-22 MED ORDER — ANTICOAGULANT SODIUM CITRATE 4% (200MG/5ML) IV SOLN: 5.0000 mL | Status: DC | PRN

## 2023-12-22 MED ORDER — HYDRALAZINE HCL 25 MG PO TABS
75.0000 mg | ORAL_TABLET | Freq: Three times a day (TID) | ORAL | Status: DC
  Administered 2023-12-22 – 2023-12-23 (×4): 75 mg via ORAL
  Filled 2023-12-22 (×6): qty 3

## 2023-12-22 MED ORDER — OLANZAPINE 10 MG IM SOLR: 5.0000 mg | Freq: Four times a day (QID) | INTRAMUSCULAR | Status: DC | PRN

## 2023-12-22 MED ORDER — DEXTROSE 50 % IV SOLN: 12.5000 g | Freq: Once | INTRAVENOUS | Status: DC

## 2023-12-22 MED ORDER — PENTAFLUOROPROP-TETRAFLUOROETH EX AERO: 1.0000 | INHALATION_SPRAY | CUTANEOUS | Status: DC | PRN

## 2023-12-22 MED ORDER — ALTEPLASE 2 MG IJ SOLR: 2.0000 mg | Freq: Once | INTRAMUSCULAR | Status: DC | PRN

## 2023-12-22 MED ORDER — AMLODIPINE BESYLATE 5 MG PO TABS
10.0000 mg | ORAL_TABLET | Freq: Every day | ORAL | Status: DC
  Administered 2023-12-22 – 2023-12-23 (×2): 10 mg via ORAL
  Filled 2023-12-22 (×2): qty 2

## 2023-12-22 MED ORDER — HEPARIN SODIUM (PORCINE) 5000 UNIT/ML IJ SOLN
5000.0000 [IU] | Freq: Three times a day (TID) | INTRAMUSCULAR | Status: DC
  Administered 2023-12-22 – 2023-12-24 (×6): 5000 [IU] via SUBCUTANEOUS
  Filled 2023-12-22 (×6): qty 1

## 2023-12-22 MED ORDER — CHLORHEXIDINE GLUCONATE CLOTH 2 % EX PADS
6.0000 | MEDICATED_PAD | Freq: Every day | CUTANEOUS | Status: DC
  Administered 2023-12-23 – 2023-12-24 (×2): 6 via TOPICAL

## 2023-12-22 MED ORDER — LORAZEPAM 2 MG/ML IJ SOLN
1.0000 mg | Freq: Once | INTRAMUSCULAR | Status: AC
  Administered 2023-12-22: 1 mg via INTRAVENOUS
  Filled 2023-12-22: qty 1

## 2023-12-22 NOTE — Progress Notes (Signed)
 Received patient in bed to unit.  Alert and oriented.  Informed consent signed and in chart.   TX duration: 3 hours  Patient tolerated well.  Transported back to the room  Alert, without acute distress.  Hand-off given to patient's nurse.   Access used: Left Fistula Upper ARm Access issues: none  Total UF removed: 3L Medication(s) given: none   12/22/23 1652  Vitals  Temp 97.7 F (36.5 C)  BP 132/73  Pulse Rate 93  Resp 10  Oxygen Therapy  SpO2 100 %  O2 Device Nasal Cannula  Post Treatment  Dialyzer Clearance Lightly streaked  Liters Processed 72  Fluid Removed (mL) 3000 mL  Tolerated HD Treatment Yes  AVG/AVF Arterial Site Held (minutes) 5 minutes  AVG/AVF Venous Site Held (minutes) 5 minutes  Fistula / Graft Left Upper arm Arteriovenous fistula  No Placement Date or Time found.   Placed prior to admission: Yes  Orientation: Left  Access Location: Upper arm  Access Type: Arteriovenous fistula  Status Deaccessed     Camellia Brasil LPN [facility]

## 2023-12-22 NOTE — ED Notes (Signed)
 CCMD called, pt on monitor

## 2023-12-22 NOTE — Consult Note (Addendum)
 [facility] Renal Consultation Note    Indication for Consultation:  Management of ESRD/hemodialysis, anemia, hypertension/volume, and secondary hyperparathyroidism.  HPI: Ashley [name] is a 73 y.o. female with ESRD, COPD, HTN, Hx CVA, Hx GIB, HFrEF who is being admitted for AMS + hyperkalemia.  Was seen in ED yesterday with R shoulder pain s/p fall. Xray negative for fracture. She had missed HD that day - labs had showed K 5.9. She was given Lokelma  but refused admission -> left AMA. Back to ED this AM after becoming more lethargic and confused. BP is high. Labs this AM with K up to 6.5, BUN 55, BNP 1862, WBC 7.7, Hgb 11.2. CXR with pulm edema. CT head without acute findings. Was acting restless and agitated in ED requiring ativan . She is being admitted.  Our team was consulted for hyperkalemia. Seen after getting the ativan  and she is calm, although still a little confused. C/o severe R shoulder pain and mild dyspnea. No fever, chills, CP, abdominal pain.  Dialyzes on MWF schedule at [facility] - last HD there was Friday 7/11. Uses AVF without any recent issues.  Past Medical History:  Diagnosis Date   Acute pancreatitis 2000   2000, 12/2018, 08/2019   Arthritis    Cervical radiculopathy 02/28/2011   Cocaine substance abuse (HCC) 05/26/2013   positive UDS    COPD (chronic obstructive pulmonary disease) (HCC)    Duodenitis    Dyspnea    Erosive gastropathy    ESRD on hemodialysis (HCC)    TTS   GERD (gastroesophageal reflux disease)    Hepatitis C 1987   dt hx IVDA.  genotype 2B.  Epclusa  started early 04/2020.     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  Chronic microvascular ischemic changes w parenchymal volume loss.  Chronic white matter periventricular microhemorrhage, likely due to htn   Ulcer 1990   gastric ulcer. Ruptured s/p emergency repair   Past Surgical History:  Procedure Laterality Date   ABDOMINAL HYSTERECTOMY  1979   AV FISTULA PLACEMENT Left 06/16/2017   Procedure: ARTERIOVENOUS (AV) FISTULA CREATION LEFT ARM;  Surgeon: Laurence Redell CROME, MD;  Location: [facility] OR;  Service: Vascular;  Laterality: Left;   BASCILIC VEIN TRANSPOSITION Left 10/02/2017   Procedure: BASILIC VEIN TRANSPOSITION SECOND STAGE LEFT ARM;  Surgeon: Oris Krystal FALCON, MD;  Location: [facility] OR;  Service: Vascular;  Laterality: Left;   BIOPSY  09/06/2019   Procedure: BIOPSY;  Surgeon: Avram Lupita FORBES, MD;  Location: [facility] ENDOSCOPY;  Service: Endoscopy;;   BIOPSY  03/13/2023   Procedure: BIOPSY;  Surgeon: San Sandor GAILS, DO;  Location: [facility] ENDOSCOPY;  Service: Gastroenterology;;   COLONOSCOPY WITH PROPOFOL  N/A 03/13/2023   Procedure: COLONOSCOPY WITH PROPOFOL ;  Surgeon: San Sandor GAILS, DO;  Location: [facility] ENDOSCOPY;  Service: Gastroenterology;  Laterality: N/A;   ENTEROSCOPY N/A 10/22/2022   Procedure: ENTEROSCOPY;  Surgeon: Legrand Victory CROME DOUGLAS, MD;  Location: [facility] ENDOSCOPY;  Service: Gastroenterology;  Laterality: N/A;   ESOPHAGOGASTRODUODENOSCOPY N/A 05/29/2013   Procedure: ESOPHAGOGASTRODUODENOSCOPY (EGD);  Surgeon: Gordy CHRISTELLA Starch, MD;  Location: [facility] ENDOSCOPY;  Service: Endoscopy;  Laterality: N/A;   ESOPHAGOGASTRODUODENOSCOPY  05/2013   for epigastric pain.  Nonobstructing Schatzki ring at GEJ, mild  gastropathy, nonbleeding AVMs in bulb and D2. 5 mm sessile polyp in bulb.   ESOPHAGOGASTRODUODENOSCOPY (EGD) WITH PROPOFOL  N/A 09/06/2019   Procedure: ESOPHAGOGASTRODUODENOSCOPY (EGD) WITH PROPOFOL ;  Surgeon: Avram Lupita BRAVO, MD;  Location: [facility] ENDOSCOPY;  Service: Endoscopy;  Laterality: N/A;   ESOPHAGOGASTRODUODENOSCOPY (EGD) WITH PROPOFOL  N/A 02/02/2020   Procedure: ESOPHAGOGASTRODUODENOSCOPY (EGD) WITH PROPOFOL ;  Surgeon: Teressa Toribio SQUIBB, MD;   Location: [facility] ENDOSCOPY;  Service: Endoscopy;  Laterality: N/A;   ESOPHAGOGASTRODUODENOSCOPY (EGD) WITH PROPOFOL  N/A 02/07/2021   Procedure: ESOPHAGOGASTRODUODENOSCOPY (EGD) WITH PROPOFOL ;  Surgeon: Teressa Toribio SQUIBB, MD;  Location: [facility] ENDOSCOPY;  Service: Endoscopy;  Laterality: N/A;   ESOPHAGOGASTRODUODENOSCOPY (EGD) WITH PROPOFOL  N/A 03/13/2023   Procedure: ESOPHAGOGASTRODUODENOSCOPY (EGD) WITH PROPOFOL ;  Surgeon: San Sandor GAILS, DO;  Location: [facility] ENDOSCOPY;  Service: Gastroenterology;  Laterality: N/A;   EUS N/A 02/02/2020   Procedure: UPPER ENDOSCOPIC ULTRASOUND (EUS) RADIAL;  Surgeon: Teressa Toribio SQUIBB, MD;  Location: [facility] ENDOSCOPY;  Service: Endoscopy;  Laterality: N/A;   EUS N/A 08/20/2023   Procedure: UPPER ENDOSCOPIC ULTRASOUND (EUS) RADIAL;  Surgeon: Wilhelmenia Aloha Raddle., MD;  Location: [facility] ENDOSCOPY;  Service: Gastroenterology;  Laterality: N/A;   EXCHANGE OF A DIALYSIS CATHETER Left 07/31/2017   Procedure: Removal  OF A  Right GroinTUNNELED  DIALYSIS CATHETER ,  Insertion of Left Femoral Dialysis Catheter.;  Surgeon: Oris Krystal FALCON, MD;  Location: [facility] OR;  Service: Vascular;  Laterality: Left;   FISTULOGRAM Left 11/19/2023   Procedure: FISTULOGRAM WITH ANGIOPLASTY OF FISTULA;  Surgeon: Magda Debby SAILOR, MD;  Location: [facility] OR;  Service: Vascular;  Laterality: Left;   HEMOSTASIS CLIP PLACEMENT  02/07/2021   Procedure: HEMOSTASIS CLIP PLACEMENT;  Surgeon: Teressa Toribio SQUIBB, MD;  Location: [facility] ENDOSCOPY;  Service: Endoscopy;;   HEMOSTASIS CLIP PLACEMENT  10/22/2022   Procedure: HEMOSTASIS CLIP PLACEMENT;  Surgeon: Legrand Victory LITTIE DOUGLAS, MD;  Location: [facility] ENDOSCOPY;  Service: Gastroenterology;;   HEMOSTASIS CLIP PLACEMENT  03/13/2023   Procedure: HEMOSTASIS CLIP PLACEMENT;  Surgeon: San Sandor GAILS, DO;  Location: [facility] ENDOSCOPY;  Service: Gastroenterology;;   HEMOSTASIS CLIP PLACEMENT  08/20/2023   Procedure: CONTROL OF HEMORRHAGE, GI TRACT, ENDOSCOPIC, BY CLIPPING OR OVERSEWING;  Surgeon: Wilhelmenia Aloha Raddle., MD;  Location: [facility] ENDOSCOPY;  Service: Gastroenterology;;   HOT HEMOSTASIS N/A 09/06/2019   Procedure: HOT HEMOSTASIS (ARGON PLASMA COAGULATION/BICAP);  Surgeon: Avram Lupita BRAVO, MD;  Location: [facility] ENDOSCOPY;  Service: Endoscopy;  Laterality: N/A;   HOT HEMOSTASIS N/A 02/07/2021   Procedure: HOT HEMOSTASIS (ARGON PLASMA COAGULATION/BICAP);  Surgeon: Teressa Toribio SQUIBB, MD;  Location: [facility] ENDOSCOPY;  Service: Endoscopy;  Laterality: N/A;   HOT HEMOSTASIS N/A 10/22/2022   Procedure: HOT HEMOSTASIS (ARGON PLASMA COAGULATION/BICAP);  Surgeon: Legrand Victory LITTIE DOUGLAS, MD;  Location: [facility] ENDOSCOPY;  Service: Gastroenterology;  Laterality: N/A;   HOT HEMOSTASIS N/A 08/20/2023   Procedure: EGD, WITH ARGON PLASMA COAGULATION;  Surgeon: Wilhelmenia Aloha Raddle., MD;  Location: [facility] ENDOSCOPY;  Service: Gastroenterology;  Laterality: N/A;   INSERTION OF DIALYSIS CATHETER Right 06/16/2017   Procedure: INSERTION OF DIALYSIS CATHETER;  Surgeon: Laurence Redell LITTIE, MD;  Location: [facility] OR;  Service: Vascular;  Laterality: Right;   IR AV DIALY SHUNT INTRO NEEDLE/INTRACATH INITIAL W/PTA/IMG LEFT  06/21/2018   POLYPECTOMY  03/13/2023   Procedure: POLYPECTOMY;  Surgeon: San Sandor GAILS, DO;  Location: [facility] ENDOSCOPY;  Service: Gastroenterology;;   POLYPECTOMY  08/20/2023   Procedure: POLYPECTOMY;  Surgeon: Wilhelmenia Aloha Raddle., MD;  Location: [facility] ENDOSCOPY;  Service: Gastroenterology;;   REPAIR OF PERFORATED ULCER  1990   gastric ulcer   SCLEROTHERAPY  10/22/2022   Procedure: SCLEROTHERAPY;  Surgeon: Legrand Victory LITTIE DOUGLAS, MD;  Location: [facility] ENDOSCOPY;  Service: Gastroenterology;;   SUBMUCOSAL TATTOO INJECTION  10/22/2022   Procedure: SUBMUCOSAL TATTOO INJECTION;  Surgeon: Legrand Victory LITTIE DOUGLAS, MD;  Location: [facility] ENDOSCOPY;  Service: Gastroenterology;;   Family History  Problem Relation Age of Onset   Hypertension Father    Cancer Father    Hyperlipidemia Father    Seizures Sister    Early death Daughter    Kidney disease  Daughter        end stage dialysis dependent    Breast cancer Maternal Aunt    Social History:  reports that she has been smoking cigarettes. She has a 10 pack-year smoking history. She has been exposed to tobacco smoke. She has never used smokeless tobacco. She reports that she does not currently use drugs after having used the following drugs: Heroin, Marijuana, and Cocaine. She reports that she does not drink alcohol.  ROS: As per HPI otherwise negative.  Physical Exam: Vitals:   12/22/23 1045 12/22/23 1046 12/22/23 1100 12/22/23 1115  BP: (!) 190/66  (!) 196/70 (!) 187/71  Pulse: 74  75 72  Resp: 10  11 13   Temp:  98 F (36.7 C)    SpO2: 100%  100% 96%  Weight:      Height:         General: Frail woman, NAD. [place] O2 in place.  Head: Normocephalic, atraumatic Neck: Supple without lymphadenopathy/masses. Lungs: Scattered rhonchi throughout Heart: RRR; 2/6 murmur Abdomen: Soft, non-tender, non-distended with normoactive bowel sounds.  Musculoskeletal:  Strength and tone appear normal for age. Lower extremities: No edema or ischemic changes, no open wounds. Neuro: Alert/awake. Does not recognize me Psych:  Responds to questions appropriately with a normal affect. Dialysis Access: L AVF +t/b  Allergies  Allergen Reactions   Aspirin  Nausea And Vomiting    Stomach ache   Ibuprofen Nausea And Vomiting    Stomach ache   Prior to Admission medications   Medication Sig Start Date End Date Taking? Authorizing Provider  albuterol  (VENTOLIN  HFA) 108 (90 Base) MCG/ACT inhaler Inhale 2 puffs into the lungs every 6 (six) hours as needed for wheezing or shortness of breath. 06/20/23   Jillian Buttery, MD  amLODipine  (NORVASC ) 10 MG tablet Take 1 tablet (10 mg total) by mouth daily. Patient taking differently: Take 10 mg by mouth at bedtime. 11/11/16   Phelps, Jazma Y, DO  CREON  12000-38000 units CPEP capsule Take 4 capsules by mouth 2 (two) times daily with a meal. 03/02/23   [provider]  Fluticasone-Umeclidin-Vilant (TRELEGY ELLIPTA ) 100-62.5-25 MCG/ACT AEPB Inhale 1 puff into the lungs daily. 10/20/23   Wert, Michael B, MD  Fluticasone-Umeclidin-Vilant (TRELEGY ELLIPTA ) 100-62.5-25 MCG/ACT AEPB Inhale 1 puff into the lungs daily. One click each am 10/20/23   Darlean Ozell NOVAK, MD  gabapentin  (NEURONTIN ) 100 MG capsule Take 2 capsules (200 mg total) by mouth at bedtime. 06/20/23   Jillian Buttery, MD  hydrALAZINE  (APRESOLINE ) 25 MG tablet Take 3 tablets (75 mg total) by mouth every 8 (eight) hours. 11/14/20 03/09/24  [place], Eric J, DO  HYDROcodone -acetaminophen  (NORCO/VICODIN) 5-325 MG tablet Take 1 tablet by mouth every 6 (six) hours as needed for severe pain (pain score 7-10). 10/30/23   Barrett, Jamie N, PA-C  ipratropium-albuterol  (DUONEB) 0.5-2.5 (3) MG/3ML SOLN Take 3 mLs by nebulization 3 (three) times daily. Patient taking differently: Take 3 mLs  by nebulization every 6 (six) hours as needed (For shortness of breath). 06/20/23   Jillian Buttery, MD  methocarbamol  (ROBAXIN ) 500 MG tablet Take 1 tablet (500 mg total) by mouth 2 (two) times daily. 10/30/23   Barrett, Warren SAILOR, PA-C  pantoprazole  (PROTONIX ) 40 MG tablet Take 1 tablet (40 mg total) by mouth daily. 03/05/23 10/07/23  Barrett, Jamie N, PA-C  pantoprazole  (PROTONIX ) 40 MG tablet Take 1 tablet (40 mg total) by mouth 2 (two) times daily before a meal. 08/20/23 08/19/24  Mansouraty, Aloha Raddle., MD  sevelamer  carbonate (RENVELA ) 800 MG tablet Take 800 mg by mouth 3 (three) times daily with meals. 07/23/20   [provider]   Current Facility-Administered Medications  Medication Dose Route Frequency Provider Last Rate Last Admin   alteplase  (CATHFLO ACTIVASE ) injection 2 mg  2 mg Intracatheter Once PRN Arnetia Bronk C, MD       anticoagulant sodium citrate  solution 5 mL  5 mL Intracatheter PRN Jerrye Katheryn BROCKS, MD       Chlorhexidine  Gluconate Cloth 2 % PADS 6 each  6 each Topical Q0600 Jerrye Katheryn BROCKS, MD        heparin  injection 1,000 Units  1,000 Units Intracatheter PRN Jerrye Katheryn BROCKS, MD       heparin  injection 5,000 Units  5,000 Units Subcutaneous Q8H McLendon, Michael, MD       lidocaine  (PF) (XYLOCAINE ) 1 % injection 5 mL  5 mL Intradermal PRN Jerrye Katheryn BROCKS, MD       lidocaine -prilocaine  (EMLA ) cream 1 Application  1 Application Topical PRN Shaquanta Harkless C, MD       pentafluoroprop-tetrafluoroeth JUANA) aerosol 1 Application  1 Application Topical PRN Jerrye Katheryn BROCKS, MD       sodium zirconium cyclosilicate  (LOKELMA ) packet 10 g  10 g Oral Once Jerrye Katheryn BROCKS, MD       Current Outpatient Medications  Medication Sig Dispense Refill   albuterol  (VENTOLIN  HFA) 108 (90 Base) MCG/ACT inhaler Inhale 2 puffs into the lungs every 6 (six) hours as needed for wheezing or shortness of breath. 8 g 2   amLODipine  (NORVASC ) 10 MG tablet Take 1 tablet (10 mg total) by mouth daily. (Patient taking differently: Take 10 mg by mouth at bedtime.) 90 tablet 1   CREON  12000-38000 units CPEP capsule Take 4 capsules by mouth 2 (two) times daily with a meal.     Fluticasone-Umeclidin-Vilant (TRELEGY ELLIPTA ) 100-62.5-25 MCG/ACT AEPB Inhale 1 puff into the lungs daily.     Fluticasone-Umeclidin-Vilant (TRELEGY ELLIPTA ) 100-62.5-25 MCG/ACT AEPB Inhale 1 puff into the lungs daily. One click each am 1 each 11   gabapentin  (NEURONTIN ) 100 MG capsule Take 2 capsules (200 mg total) by mouth at bedtime.     hydrALAZINE  (APRESOLINE ) 25 MG tablet Take 3 tablets (75 mg total) by mouth every 8 (eight) hours. 270 tablet 2   HYDROcodone -acetaminophen  (NORCO/VICODIN) 5-325 MG tablet Take 1 tablet by mouth every 6 (six) hours as needed for severe pain (pain score 7-10). 10 tablet 0   ipratropium-albuterol  (DUONEB) 0.5-2.5 (3) MG/3ML SOLN Take 3 mLs by nebulization 3 (three) times daily. (Patient taking differently: Take 3 mLs by nebulization every 6 (six) hours as needed (For shortness of breath).) 360 mL 1   methocarbamol  (ROBAXIN ) 500  MG tablet Take 1 tablet (500 mg total) by mouth 2 (two) times daily. 20 tablet 0   pantoprazole  (PROTONIX ) 40 MG tablet Take 1 tablet (40 mg total) by mouth daily. 30 tablet 2  pantoprazole  (PROTONIX ) 40 MG tablet Take 1 tablet (40 mg total) by mouth 2 (two) times daily before a meal. 60 tablet 0   sevelamer  carbonate (RENVELA ) 800 MG tablet Take 800 mg by mouth 3 (three) times daily with meals.     Labs: Basic Metabolic Panel: Recent Labs  Lab 12/21/23 1021 12/22/23 0801  NA 133* 135  K 5.9* 6.5*  CL 93* 91*  CO2 24 26  GLUCOSE 84 69*  BUN 41* 55*  CREATININE 10.57* 12.27*  CALCIUM  10.1 10.3   Liver Function Tests: Recent Labs  Lab 12/21/23 1021 12/22/23 0801  AST 13* 16  ALT 7 8  ALKPHOS 64 66  BILITOT 0.8 1.0  PROT 7.0 7.5  ALBUMIN  3.5 3.7   CBC: Recent Labs  Lab 12/21/23 1021 12/22/23 0801  WBC 6.9 7.7  NEUTROABS 5.1 5.5  HGB 10.8* 11.2*  HCT 34.7* 34.0*  MCV 91.6 90.9  PLT 296 286   Studies/Results: CT Head Wo Contrast Result Date: 12/22/2023 CLINICAL DATA:  Provided history: Mental status change, unknown cause. Additional history provided: Fatigue. EXAM: CT HEAD WITHOUT CONTRAST TECHNIQUE: Contiguous axial images were obtained from the base of the skull through the vertex without intravenous contrast. RADIATION DOSE REDUCTION: This exam was performed according to the departmental dose-optimization program which includes automated exposure control, adjustment of the mA and/or kV according to patient size and/or use of iterative reconstruction technique. COMPARISON:  Brain MRI 06/14/2017. FINDINGS: Brain: Generalized cerebral atrophy. Patchy and ill-defined hypoattenuation within the cerebral white matter, nonspecific but compatible with moderate chronic small vessel ischemic disease. There is no acute intracranial hemorrhage. No demarcated cortical infarct. No extra-axial fluid collection. No evidence of an intracranial mass. No midline shift. Vascular: No  hyperdense vessel.  Atherosclerotic calcifications. Skull: No calvarial fracture or aggressive osseous lesion. Sinuses/Orbits: No mass or acute finding within the imaged orbits. No significant paranasal sinus disease at the imaged levels. Other: Left parotid gland is poorly delineated at the imaged levels, atrophic or surgically absent. IMPRESSION: 1. No evidence of an acute intracranial abnormality. 2. Moderate chronic small vessel ischemic changes within the cerebral white matter. 3. Generalized cerebral atrophy. Electronically Signed   By: Rockey Childs D.O.   On: 12/22/2023 08:26   DG Chest Portable 1 View Result Date: 12/22/2023 CLINICAL DATA:  73 year old female with cough and fatigue. EXAM: PORTABLE CHEST 1 VIEW COMPARISON:  Portable chests yesterday and earlier. FINDINGS: Portable AP semi upright view at 0655 hours. Stable cardiomegaly and mediastinal contours. Calcified aortic atherosclerosis. Visualized tracheal air column is within normal limits. Mildly lower lung volumes. Diffuse increased pulmonary interstitial opacity, symmetric. Patchy increased density at the lung bases more resembling atelectasis. No pneumothorax or pleural effusion. Left axillary vascular stent. No acute osseous abnormality identified. Paucity of bowel gas in the upper abdomen. IMPRESSION: Cardiomegaly with symmetric increased pulmonary interstitial opacity since yesterday suspicious for mild or developing interstitial edema. Mild bibasilar atelectasis. No pleural effusion is evident. Electronically Signed   By: VEAR Hurst M.D.   On: 12/22/2023 07:09   DG Chest 1 View Result Date: 12/21/2023 CLINICAL DATA:  Shortness of breath EXAM: CHEST  1 VIEW COMPARISON:  Chest radiograph dated 12/18/2023 FINDINGS: Partially imaged left axillary vascular stent graft. Normal lung volumes. No focal consolidations. No pleural effusion or pneumothorax. Similar mildly enlarged cardiomediastinal silhouette. No acute osseous abnormality. IMPRESSION:  1.  No focal consolidations. 2. Similar mild cardiomegaly. Electronically Signed   By: Limin  Xu M.D.   On: 12/21/2023 11:38  DG Shoulder Right Result Date: 12/21/2023 CLINICAL DATA:  Right shoulder pain. EXAM: RIGHT SHOULDER - 2+ VIEW COMPARISON:  December 18, 2023. FINDINGS: There is no evidence of fracture or dislocation. There is no evidence of arthropathy or other focal bone abnormality. Soft tissues are unremarkable. IMPRESSION: Negative. Electronically Signed   By: Lynwood Landy Raddle M.D.   On: 12/21/2023 11:32   Dialysis Orders:  MWF [facility] 3:30hr, 350/500, EDW 46kg, 2K/2.5Ca bath, UFP #4, AVF, no heparin  - no ESA, Hgb > 11 - Venofer  50mg  IV q week - Hectoral 4mcg IV q HD - Cuts every HD short to 3hr, leaves ~1kg above EDW  Assessment/Plan:  Hyperkalemia: K 6.5 today - in setting of missed HD -> for HD today to correct.  ESRD:  Usual MWF schedule - missed HD yesterday -> HD today.  AMS: D/t pain? Follow post-HD.  Hypertension/volume: BP high - resume home meds.  Anemia: Hgb > 11, no ESA for now.  Metabolic bone disease: Ca high, hold VDRA for now. Phos pending.  COPD  HFrEF R shoulder pain s/p fall: No fracture on imaging.  Izetta Boehringer, PA-C 12/22/2023, 11:51 AM   [facility]    Seen and examined independently.  Agree with note and exam as documented above by physician extender and as noted here.  Patient with ESRD, HTN, heart failure with reduced EF who initially presented to the ER the previous day for a fall.  Left AMA then came back lethargic and hyperkalemic.  Nephrology is consulted for assistance with HD.  She got ativan  in the ER and has been sleepy in HD.   Seen and examined on dialysis.  Blood pressure 161/89 and HR 106.  Tolerating goal.    General adult female in bed - sleepy and arouses to sternal rub but doesn't answer questions HEENT normocephalic atraumatic extraocular movements intact sclera anicteric Neck supple trachea midline Lungs clear to  auscultation bilaterally normal work of breathing at rest on 2liters Heart S1S2 no rub Abdomen soft nontender nondistended Extremities no pitting edema  Psych lethargic Access LUE AVF in use   Hyperkalemia - HD today  ESRD - HD today and assess needs daily. Usually MWF and can transition back to that as able  AMS - s/p sedating meds but came in altered as well - HD today.  May need HD tomorrow for AMS if mental status unchanged  HTN - optimize volume status with HD  Anemia of CKD - no ESA needs  Metabolic bone disease - holding activated vit D due to calcium  up   Heart failure with reduced EF - optimize volume with HD  Katheryn JAYSON Saba, MD 12/22/2023  3:08 PM

## 2023-12-22 NOTE — ED Notes (Signed)
 Ashley [name] has uplaoded documents to patient chart and efiled IVC envelope [id number] awaiting findings and custody order

## 2023-12-22 NOTE — Discharge Instructions (Addendum)
 Thank you for allowing us  to be part of your care. You were hospitalized for acute psychosis. We treated you with dialysis.  You may take tylenol  650mg  every six hours as needed for shoulder pain.   Please continue your dialysis as regularly scheduled.   FOLLOW UP APPOINTMENTS: We arranged for you to follow up with [facility] 12[facility]/2023 at 9:15 with Dr. Waymond Please call the psychiatry office [phone]) to set up a follow up in 7-14 days You should also be contacted to arrange Home Health physical and occupational home health.   Please call your PCP or our clinic if you have any questions or concerns, we may be able to help and keep you from a long and expensive emergency room wait. Our clinic and after hours phone number is [phone]. The best time to call is Monday through Friday 9 am to 4 pm but there is always someone available 24/7 if you have an emergency. If you need medication refills please notify your pharmacy one week in advance and they will send us  a request.   We are glad you are feeling better,  [name], DO Internal Medicine Inpatient Teaching Service at [facility]

## 2023-12-22 NOTE — ED Notes (Signed)
 New envelope due to rejection by magistrate envelope [phone] has been efiled and now awaiting findings and custody order

## 2023-12-22 NOTE — Progress Notes (Signed)
 Pt returned and received dinner tray @2010 

## 2023-12-22 NOTE — ED Notes (Shared)
 Ivc paper work complete  case #[id number] COPIES MADE ORIGINAL IN THE RED FOLDER 3 COPIES GIVEN TO THE SECRETARY

## 2023-12-22 NOTE — Progress Notes (Signed)
 Pt transported to MRI @1905 

## 2023-12-22 NOTE — ED Notes (Signed)
 Patient transported to CT

## 2023-12-22 NOTE — ED Provider Notes (Signed)
 [place] [facility]ERGENCY DEPARTMENT AT [facility] Provider Note   CSN: [id number] Arrival date & time: 12/22/23  9381     Patient presents with: Fatigue   Ashley [name] is a 73 y.o. female.  Patient with past history significant for CVA, hyperlipidemia, dyspnea on exertion, polysubstance abuse, ESRD on hemodialysis presents ED with concerns of fatigue and shortness of breath.  Patient reports that she was here yesterday and was advised that she would require dialysis but ultimately declined at that time due to concerns of delays.  Patient's daughter called EMS today after noticing that she was notably more fatigued and requiring supplemental oxygen that she does not use at baseline.  Last dialysis session was on Friday of last week, 4 and half days ago.  HPI     Prior to Admission medications   Medication Sig Start Date End Date Taking? Authorizing Provider  albuterol  (VENTOLIN  HFA) 108 (90 Base) MCG/ACT inhaler Inhale 2 puffs into the lungs every 6 (six) hours as needed for wheezing or shortness of breath. 06/20/23   Jillian Buttery, MD  amLODipine  (NORVASC ) 10 MG tablet Take 1 tablet (10 mg total) by mouth daily. Patient taking differently: Take 10 mg by mouth at bedtime. 11/11/16   Phelps, Jazma Y, DO  CREON  12000-38000 units CPEP capsule Take 4 capsules by mouth 2 (two) times daily with a meal. 03/02/23   [provider]  Fluticasone-Umeclidin-Vilant (TRELEGY ELLIPTA ) 100-62.5-25 MCG/ACT AEPB Inhale 1 puff into the lungs daily. 10/20/23   Wert, Michael B, MD  Fluticasone-Umeclidin-Vilant (TRELEGY ELLIPTA ) 100-62.5-25 MCG/ACT AEPB Inhale 1 puff into the lungs daily. One click each am 10/20/23   Darlean Ozell NOVAK, MD  gabapentin  (NEURONTIN ) 100 MG capsule Take 2 capsules (200 mg total) by mouth at bedtime. 06/20/23   Jillian Buttery, MD  hydrALAZINE  (APRESOLINE ) 25 MG tablet Take 3 tablets (75 mg total) by mouth every 8 (eight) hours. 11/14/20 03/09/24  [place], Eric J, DO   HYDROcodone -acetaminophen  (NORCO/VICODIN) 5-325 MG tablet Take 1 tablet by mouth every 6 (six) hours as needed for severe pain (pain score 7-10). 10/30/23   Barrett, Warren SAILOR, PA-C  ipratropium-albuterol  (DUONEB) 0.5-2.5 (3) MG/3ML SOLN Take 3 mLs by nebulization 3 (three) times daily. Patient taking differently: Take 3 mLs by nebulization every 6 (six) hours as needed (For shortness of breath). 06/20/23   Jillian Buttery, MD  methocarbamol  (ROBAXIN ) 500 MG tablet Take 1 tablet (500 mg total) by mouth 2 (two) times daily. 10/30/23   Barrett, Jamie N, PA-C  pantoprazole  (PROTONIX ) 40 MG tablet Take 1 tablet (40 mg total) by mouth daily. 03/05/23 10/07/23  Barrett, Warren SAILOR, PA-C  pantoprazole  (PROTONIX ) 40 MG tablet Take 1 tablet (40 mg total) by mouth 2 (two) times daily before a meal. 08/20/23 08/19/24  Mansouraty, Aloha Raddle., MD  sevelamer  carbonate (RENVELA ) 800 MG tablet Take 800 mg by mouth 3 (three) times daily with meals. 07/23/20   [provider]    Allergies: Aspirin  and Ibuprofen    Review of Systems  Constitutional:  Positive for fatigue.  Respiratory:  Positive for shortness of breath.   All other systems reviewed and are negative.   Updated Vital Signs BP (!) 213/81   Pulse 72   Temp 98.1 F (36.7 C)   Resp 14   Ht 5' 2 (1.575 m)   Wt 46.5 kg   SpO2 100%   BMI 18.75 kg/m   Physical Exam Vitals and nursing note reviewed.  Constitutional:  General: She is not in acute distress.    Appearance: She is well-developed. She is ill-appearing.  HENT:     Head: Normocephalic and atraumatic.  Eyes:     Conjunctiva/sclera: Conjunctivae normal.  Cardiovascular:     Rate and Rhythm: Normal rate and regular rhythm.     Heart sounds: Murmur heard.  Pulmonary:     Effort: Pulmonary effort is normal. No respiratory distress.     Breath sounds: Rhonchi present. No wheezing.  Abdominal:     Palpations: Abdomen is soft.     Tenderness: There is no abdominal tenderness.   Musculoskeletal:        General: No swelling.     Cervical back: Neck supple.     Right lower leg: No edema.     Left lower leg: No edema.  Skin:    General: Skin is warm and dry.     Capillary Refill: Capillary refill takes less than 2 seconds.  Neurological:     Mental Status: She is alert.  Psychiatric:        Mood and Affect: Mood normal.     (all labs ordered are listed, but only abnormal results are displayed) Labs Reviewed  CBC WITH DIFFERENTIAL/PLATELET - Abnormal; Notable for the following components:      Result Value   RBC 3.74 (*)    Hemoglobin 11.2 (*)    HCT 34.0 (*)    RDW 15.6 (*)    All other components within normal limits  COMPREHENSIVE METABOLIC PANEL WITH GFR - Abnormal; Notable for the following components:   Potassium 6.5 (*)    Chloride 91 (*)    Glucose, Bld 69 (*)    BUN 55 (*)    Creatinine, Ser 12.27 (*)    GFR, Estimated 3 (*)    Anion gap 18 (*)    All other components within normal limits  BRAIN NATRIURETIC PEPTIDE - Abnormal; Notable for the following components:   B Natriuretic Peptide 1,862.1 (*)    All other components within normal limits  RESP PANEL BY RT-PCR (RSV, FLU A&B, COVID)  RVPGX2  HEPATITIS B SURFACE ANTIGEN  HEPATITIS B SURFACE ANTIBODY, QUANTITATIVE    EKG: EKG Interpretation Date/Time:  Tuesday December 22 2023 06:35:54 EDT Ventricular Rate:  71 PR Interval:  103 QRS Duration:  81 QT Interval:  411 QTC Calculation: 447 R Axis:   -33  Text Interpretation: Ectopic atrial rhythm Short PR interval Left axis deviation Nonspecific T abnrm, anterolateral leads No significant change since last tracing Confirmed by Haze Lonni PARAS [phone]) on 12/22/2023 6:39:08 AM  Radiology: CT Head Wo Contrast Result Date: 12/22/2023 CLINICAL DATA:  Provided history: Mental status change, unknown cause. Additional history provided: Fatigue. EXAM: CT HEAD WITHOUT CONTRAST TECHNIQUE: Contiguous axial images were obtained from the base  of the skull through the vertex without intravenous contrast. RADIATION DOSE REDUCTION: This exam was performed according to the departmental dose-optimization program which includes automated exposure control, adjustment of the mA and/or kV according to patient size and/or use of iterative reconstruction technique. COMPARISON:  Brain MRI 06/14/2017. FINDINGS: Brain: Generalized cerebral atrophy. Patchy and ill-defined hypoattenuation within the cerebral white matter, nonspecific but compatible with moderate chronic small vessel ischemic disease. There is no acute intracranial hemorrhage. No demarcated cortical infarct. No extra-axial fluid collection. No evidence of an intracranial mass. No midline shift. Vascular: No hyperdense vessel.  Atherosclerotic calcifications. Skull: No calvarial fracture or aggressive osseous lesion. Sinuses/Orbits: No mass or acute finding within the  imaged orbits. No significant paranasal sinus disease at the imaged levels. Other: Left parotid gland is poorly delineated at the imaged levels, atrophic or surgically absent. IMPRESSION: 1. No evidence of an acute intracranial abnormality. 2. Moderate chronic small vessel ischemic changes within the cerebral white matter. 3. Generalized cerebral atrophy. Electronically Signed   By: Rockey Childs D.O.   On: 12/22/2023 08:26   DG Chest Portable 1 View Result Date: 12/22/2023 CLINICAL DATA:  73 year old female with cough and fatigue. EXAM: PORTABLE CHEST 1 VIEW COMPARISON:  Portable chests yesterday and earlier. FINDINGS: Portable AP semi upright view at 0655 hours. Stable cardiomegaly and mediastinal contours. Calcified aortic atherosclerosis. Visualized tracheal air column is within normal limits. Mildly lower lung volumes. Diffuse increased pulmonary interstitial opacity, symmetric. Patchy increased density at the lung bases more resembling atelectasis. No pneumothorax or pleural effusion. Left axillary vascular stent. No acute osseous  abnormality identified. Paucity of bowel gas in the upper abdomen. IMPRESSION: Cardiomegaly with symmetric increased pulmonary interstitial opacity since yesterday suspicious for mild or developing interstitial edema. Mild bibasilar atelectasis. No pleural effusion is evident. Electronically Signed   By: VEAR Hurst M.D.   On: 12/22/2023 07:09   DG Chest 1 View Result Date: 12/21/2023 CLINICAL DATA:  Shortness of breath EXAM: CHEST  1 VIEW COMPARISON:  Chest radiograph dated 12/18/2023 FINDINGS: Partially imaged left axillary vascular stent graft. Normal lung volumes. No focal consolidations. No pleural effusion or pneumothorax. Similar mildly enlarged cardiomediastinal silhouette. No acute osseous abnormality. IMPRESSION: 1.  No focal consolidations. 2. Similar mild cardiomegaly. Electronically Signed   By: Limin  Xu M.D.   On: 12/21/2023 11:38   DG Shoulder Right Result Date: 12/21/2023 CLINICAL DATA:  Right shoulder pain. EXAM: RIGHT SHOULDER - 2+ VIEW COMPARISON:  December 18, 2023. FINDINGS: There is no evidence of fracture or dislocation. There is no evidence of arthropathy or other focal bone abnormality. Soft tissues are unremarkable. IMPRESSION: Negative. Electronically Signed   By: Lynwood Landy Raddle M.D.   On: 12/21/2023 11:32     .Critical Care  Performed by: Dawit Tankard A, PA-C Authorized by: Quindarrius Joplin A, PA-C   Critical care provider statement:    Critical care time (minutes):  33   Critical care start time:  12/22/2023 10:10 AM   Critical care end time:  12/22/2023 10:43 AM   Critical care time was exclusive of:  Separately billable procedures and treating other patients   Critical care was necessary to treat or prevent imminent or life-threatening deterioration of the following conditions:  Renal failure and metabolic crisis   Critical care was time spent personally by me on the following activities:  Development of treatment plan with patient or surrogate, discussions with consultants,  examination of patient, evaluation of patient's response to treatment, ordering and review of radiographic studies, ordering and review of laboratory studies and ordering and performing treatments and interventions   I assumed direction of critical care for this patient from another provider in my specialty: no     Care discussed with: admitting provider      Medications Ordered in the ED  sodium zirconium cyclosilicate  (LOKELMA ) packet 10 g (10 g Oral Not Given 12/22/23 0941)  Chlorhexidine  Gluconate Cloth 2 % PADS 6 each (0 each Topical Hold 12/22/23 0945)  pentafluoroprop-tetrafluoroeth (GEBAUERS) aerosol 1 Application (has no administration in time range)  lidocaine  (PF) (XYLOCAINE ) 1 % injection 5 mL (has no administration in time range)  lidocaine -prilocaine  (EMLA ) cream 1 Application (has no administration in  time range)  heparin  injection 1,000 Units (has no administration in time range)  anticoagulant sodium citrate  solution 5 mL (has no administration in time range)  alteplase  (CATHFLO ACTIVASE ) injection 2 mg (has no administration in time range)  LORazepam  (ATIVAN ) injection 1 mg (has no administration in time range)  hydrALAZINE  (APRESOLINE ) tablet 75 mg (75 mg Oral Given 12/22/23 0940)                                    Medical Decision Making Amount and/or Complexity of Data Reviewed Labs: ordered. Radiology: ordered.  Risk Prescription drug management.   This patient presents to the ED for concern of fatigue and shortness of breath, this involves an extensive number of treatment options, and is a complaint that carries with it a high risk of complications and morbidity.  The differential diagnosis includes respiratory failure, CHF, hyperkalemia   Co morbidities that complicate the patient evaluation  ESRD on hemodialysis, CVA, hyperlipidemia, DOE   Lab Tests:  I Ordered, and personally interpreted labs.  The pertinent results include: CBC shows stable anemia, CMP  with worsening hyperkalemia now at 6.5, Mistry panel negative, BNP elevated at 1862   Imaging Studies ordered:  I ordered imaging studies including chest x-ray, CT head I independently visualized and interpreted imaging which showed Cardiomegaly with symmetric increased pulmonary interstitial opacity since yesterday suspicious for mild or developing interstitial edema. Mild bibasilar atelectasis. No pleural effusion is evident.Cardiomegaly with symmetric increased pulmonary interstitial opacity since yesterday suspicious for mild or developing interstitial edema. Mild bibasilar atelectasis. No pleural effusion is evident.Cardiomegaly with symmetric increased pulmonary interstitial opacity since yesterday suspicious for mild or developing interstitial edema. Mild bibasilar atelectasis. No pleural effusion is evident.  CT head unremarkable. I agree with the radiologist interpretation   Cardiac Monitoring: / EKG:  The patient was maintained on a cardiac monitor.  I personally viewed and interpreted the cardiac monitored which showed an underlying rhythm of: Ectopic atrial rhythm, nonspecific T wave changes   Consultations Obtained:  I requested consultation with the nephrologist, internal medicine team,  and discussed lab and imaging findings as well as pertinent plan - they recommend: Dr. Jerrye, nephrology, advised need for emergent dialysis.  Placed orders for dialysis.  Spoke with Dr. Norrine, IM resident, who will be admitting patient.   Problem List / ED Course / Critical interventions / Medication management  Patient with past history significant for hypertension, ESRD on hemodialysis, polysubstance abuse, CVA, hyperlipidemia, dyspnea on exertion presents today with concerns of increasing fatigue and confusion.  Patient's daughter reports that she left AMA yesterday after concerns of some level of confusion following a fall several days ago.  Had workup about 5 days ago in the emergency  department with unremarkable findings.  Reports continued confusion since that fall.  Only dialysis session this was on Monday after she was not feeling well and after leaving AMA from the hospital. On exam, patient worsening pain to the right shoulder.  Imaging performed yesterday was unremarkable. No focal area of pain in the abdomen and no abnormal bowel sounds.  Cardiac murmur heard at multiple sites.  Lung sounds unremarkable.  No appreciable lower extremity swelling or edema. Patient will require admission given extent of hyperkalemia in conjunction with increased output confusion.  Possible metabolic encephalopathy.  Consulted nephrology and internal medicine placed. Spoke with Dr. Jerrye, nephrology, who advised that patient does require dialysis given hyperkalemia.  Dose of Lokelma  ordered. Spoke with Dr. Norrine, IM resident, who will be admitting patient. I ordered medication including Lokelma , hydralazine  for hyperkalemia, hypertension Reevaluation of the patient after these medicines showed that the patient stayed the same I have reviewed the patients home medicines and have made adjustments as needed   Social Determinants of Health:  ESRD on hemodialysis   Test / Admission - Considered:  Patient requiring inpatient admission.  Final diagnoses:  ESRD needing dialysis (HCC)  Hyperkalemia  Hypertension, unspecified type  Confusion    ED Discharge Orders     None          Cecily Legrand LABOR, PA-C 12/22/23 1044    Long, Fonda MATSU, MD 12/22/23 [phone]

## 2023-12-22 NOTE — ED Notes (Signed)
 Pt climbed to foot of the bed & was trying to stand & leave, was too weak to stand & had taken one non-skid socks off & was sliding. This RN & staff attempting to calm her down & did finally get her safely back in bed, EDP came to bedside.

## 2023-12-22 NOTE — H&P (Signed)
 Date: 12/22/2023         Patient Name:  Ashley [name] MRN: [id number]  DOB: Oct 06, 1950 Age / Sex: 73 y.o., female   PCP: [facility]         Medical Service: Internal Medicine Teaching Service         Attending Physician: Dr. Rosan, Dayton BROCKS, DO    First Contact: Dr. Myrna Pager: [phone]  Second Contact: Dr. Elnora Pager: [phone]                 Chief Concern: I just want to get out of here  History of Present Illness: 73 year old presents via EMS after a fall at home. She has been in and out of the ED a few times over the last week or so. At the time of my arrival she is despondent-appearing. She repeats that she just wants to get out of the hospital and go home. Review of ED provider notes corroborate this. In their assessment she lacked capacity to leave the hospital against medical advice. She gave me permission to call her daughter who reports that for ~7 days her mother has been confused. She has been withdrawn. She has been having arguments with people who aren't there. During a recent stay in the emergency room the patient saw someone entering a storage closet across the hall and expressed concern that people were being hurt in there. As such she has been declining dialysis out of concern that she will be hurt. The patient acknowledges that she has been hearing voices that other people can't hear and that they are saying cruel and hateful things.  History notable for ESRD on HD MWF, COPD, PUD, chronic pancreatitis, history of stroke.  She lives with her daughter. She enjoys smoking cigarettes. Remainder of history was abbreviated because of her unwillingness to cooperate further  Review of Systems  Musculoskeletal:  Positive for falls.  Psychiatric/Behavioral:  Positive for hallucinations.     Allergies: Allergies  Allergen Reactions   Aspirin  Nausea And Vomiting    Stomach ache   Ibuprofen Nausea And Vomiting    Stomach ache    Past Medical  History: Reviewed, per above  Medications: No current facility-administered medications on file prior to encounter.   Current Outpatient Medications on File Prior to Encounter  Medication Sig Dispense Refill   albuterol  (VENTOLIN  HFA) 108 (90 Base) MCG/ACT inhaler Inhale 2 puffs into the lungs every 6 (six) hours as needed for wheezing or shortness of breath. 8 g 2   amLODipine  (NORVASC ) 10 MG tablet Take 1 tablet (10 mg total) by mouth daily. (Patient taking differently: Take 10 mg by mouth at bedtime.) 90 tablet 1   CREON  12000-38000 units CPEP capsule Take 4 capsules by mouth 2 (two) times daily with a meal.     Fluticasone-Umeclidin-Vilant (TRELEGY ELLIPTA ) 100-62.5-25 MCG/ACT AEPB Inhale 1 puff into the lungs daily.     gabapentin  (NEURONTIN ) 100 MG capsule Take 2 capsules (200 mg total) by mouth at bedtime.     hydrALAZINE  (APRESOLINE ) 25 MG tablet Take 3 tablets (75 mg total) by mouth every 8 (eight) hours. 270 tablet 2   HYDROcodone -acetaminophen  (NORCO/VICODIN) 5-325 MG tablet Take 1 tablet by mouth every 6 (six) hours as needed for severe pain (pain score 7-10). 10 tablet 0   ipratropium-albuterol  (DUONEB) 0.5-2.5 (3) MG/3ML SOLN Take 3 mLs by nebulization 3 (three) times daily. (Patient taking differently: Take 3 mLs by nebulization every 6 (six) hours  as needed (For shortness of breath).) 360 mL 1   methocarbamol  (ROBAXIN ) 500 MG tablet Take 1 tablet (500 mg total) by mouth 2 (two) times daily. 20 tablet 0   pantoprazole  (PROTONIX ) 40 MG tablet Take 1 tablet (40 mg total) by mouth daily. 30 tablet 2   sevelamer  carbonate (RENVELA ) 800 MG tablet Take 800 mg by mouth 3 (three) times daily with meals.       Surgical History: Reviewed, notable for fistulagram with angioplasty in June 2025.  Family History:  Reviewed, no pertinent updates.  Social History:  Reviewed, no pertinent updates.  Physical Exam: Blood pressure (!) 166/87, pulse (!) 109, temperature (!) 97 F (36.1 C),  temperature source Axillary, resp. rate 13, height 5' 2 (1.575 m), weight 46.5 kg, SpO2 100%.  No distress Heart rate and rhythm normal, decrescendo murmur throughout cardiac cycle, strong radial pulses, JVD above clavicle while upright Breathing comfortably on 2 L/min, lungs clear Skin warm and dry, AVF of LUE Alert, speech is normal, no facial asymmetry, EOM normal, gross movement normal Despondent-appearing, thought process linear, not cooperative but not necessarily inattentive, persecutory delusions and auditory hallucinations, not suicidal  EKG:  Sinus rhythm, no peaked t-waves  Labs: K 6.5 BNP 1862 Glucose 69  Images and other studies: CXR with more interstitial infiltrates than prior study CT head with chronic-appearing cerebral atrophy  Assessment & Plan:  MARIJAYNE RAUTH is a 73 y.o. with ESRD and history of stroke who presents for confusion and withdrawal after falling at home and is admitted for psychotic symptoms.  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 Auditory hallucinations and persecutory delusions. I felt IVC was appropriate because I think her decline of important medical treatment stems from her psychotic syndrome. Don't think she's delirious because she's not inattentive on my exam. Check MRI for sub-acute stroke. Check RPR, HIV, TSH. Wonder about dementia with behavior disturbance given history of CVA and cerebral atrophy on imaging. She's frail appearing and this is chronic rather than acute--wonder if her withdrawing to see in the morning. -olanzapine  5 mg prn for psychosis or agitation  Active Problems:   Hypertension Chronic. Markedly hypertensive now. Restart outpatient medications: -amlodipine  10 mg daily -hydralazine  75 mg q8 hours    ESRD on dialysis (HCC)   Hyperkalemia With hyperkalemia and volume overload. Nephrology has seen, she consents to dialysis today. She declined earlier out of fear that staff were taking her to  hurt her.    Chronic pancreatitis (HCC) Stable. On pain medication and pancreatic enzyme supplementation. -hold outpatient pain meds for now given mental status -continue creon      Hypoglycemia 69 mg/dL on admission. 12.5 g dextrose . Follow CBG.    Hypoxic respiratory failure (HCC) Stable on 2 L/min. From hypervolemia. In dialysis for volume management. -wean O2 -daily ambulatory pulse ox    COPD (chronic obstructive pulmonary disease) (HCC) Stable. No wheeze, no exacerbation. Continue inhalers. -LAMA/LABA/ICS  Level of care: med-tele Diet: reg with 1500 mL fluid restriction IVF: n/a VTE: heparin  injection 5,000 Units Start: 12/22/23 1400 Code: full Surrogate: daughter, Elgie Maxcy [phone]  Signed: Ozell Kung MD 12/22/2023, 3:41 PM Pager: [phone]

## 2023-12-22 NOTE — ED Notes (Signed)
 Pt refused the ordered Lokelma , after much attempt from this RN to convince her to take it she would not. Also stating she wants to leave & for her daughter to come get her, EDP made aware.

## 2023-12-22 NOTE — Plan of Care (Cosign Needed)
 Attempted to go see patient for psychiatric consult while she was receiving dialysis. She appeared pretty sleepy and not able/willing to participate in interview at this time. Staff member at bedside confirmed this behavior had been consistent since arriving to hemodialysis unit.  - The psychiatric team will return to see her tomorrow  Ashley [name], Medical Student

## 2023-12-22 NOTE — ED Notes (Signed)
 IVC is in process waiting for paper work to be complete by doctor

## 2023-12-22 NOTE — ED Triage Notes (Addendum)
 Pt arrived from home via [facility]. Pt was discharged from IP care here at [facility] at 7pm last night. Per pts daughter she appears to be more fatigued since arriving home. Pt is currently on 4lpm [place] to keep oxygen above 90%, pt is not on any oxygen at baseline.

## 2023-12-23 DIAGNOSIS — J9620 Acute and chronic respiratory failure, unspecified whether with hypoxia or hypercapnia: Secondary | ICD-10-CM | POA: Diagnosis not present

## 2023-12-23 DIAGNOSIS — J449 Chronic obstructive pulmonary disease, unspecified: Secondary | ICD-10-CM | POA: Diagnosis not present

## 2023-12-23 DIAGNOSIS — N186 End stage renal disease: Secondary | ICD-10-CM | POA: Diagnosis not present

## 2023-12-23 DIAGNOSIS — F0390 Unspecified dementia without behavioral disturbance: Secondary | ICD-10-CM | POA: Diagnosis not present

## 2023-12-23 LAB — CBC
HCT: 31.3 % — ABNORMAL LOW (ref 36.0–46.0)
Hemoglobin: 10.5 g/dL — ABNORMAL LOW (ref 12.0–15.0)
MCH: 30.2 pg (ref 26.0–34.0)
MCHC: 33.5 g/dL (ref 30.0–36.0)
MCV: 89.9 fL (ref 80.0–100.0)
Platelets: 303 K/uL (ref 150–400)
RBC: 3.48 MIL/uL — ABNORMAL LOW (ref 3.87–5.11)
RDW: 15.4 % (ref 11.5–15.5)
WBC: 6.7 K/uL (ref 4.0–10.5)
nRBC: 0 % (ref 0.0–0.2)

## 2023-12-23 LAB — HEPATITIS B SURFACE ANTIBODY, QUANTITATIVE: Hep B S AB Quant (Post): 39.1 m[IU]/mL

## 2023-12-23 LAB — T4, FREE: Free T4: 1.11 ng/dL (ref 0.61–1.12)

## 2023-12-23 LAB — RENAL FUNCTION PANEL
Albumin: 3.5 g/dL (ref 3.5–5.0)
Anion gap: 18 — ABNORMAL HIGH (ref 5–15)
BUN: 31 mg/dL — ABNORMAL HIGH (ref 8–23)
CO2: 26 mmol/L (ref 22–32)
Calcium: 9.9 mg/dL (ref 8.9–10.3)
Chloride: 91 mmol/L — ABNORMAL LOW (ref 98–111)
Creatinine, Ser: 7.58 mg/dL — ABNORMAL HIGH (ref 0.44–1.00)
GFR, Estimated: 5 mL/min — ABNORMAL LOW (ref >60)
Glucose, Bld: 73 mg/dL (ref 70–99)
Phosphorus: 6.7 mg/dL — ABNORMAL HIGH (ref 2.5–4.6)
Potassium: 4.5 mmol/L (ref 3.5–5.1)
Sodium: 135 mmol/L (ref 135–145)

## 2023-12-23 LAB — RPR: RPR Ser Ql: NONREACTIVE

## 2023-12-23 NOTE — Plan of Care (Signed)
   Problem: Education: Goal: Knowledge of General Education information will improve Description Including pain rating scale, medication(s)/side effects and non-pharmacologic comfort measures Outcome: Progressing

## 2023-12-23 NOTE — Progress Notes (Signed)
                 Interval history Doing better after HD.  Physical exam Blood pressure 124/67, pulse 89, temperature 98.6 F (37 C), temperature source Oral, resp. rate 15, height 5' 2 (1.575 m), weight 41.6 kg, SpO2 100%.  No distress Breathing comfortably on room air Alert, speech is normal, no facial asymmetry Broader emotional range, not despondent, flashing smiles  Weight change: -4.905 kg   Intake/Output Summary (Last 24 hours) at 12/23/2023 1636 Last data filed at 12/23/2023 1230 Gross per 24 hour  Intake 420 ml  Output 3000 ml  Net -2580 ml   Net IO Since Admission: -2,580 mL [12/23/23 1636]  Labs, images, and other studies K 4.5, improved  Assessment and plan Hospital day 0  Ashley [name] is a 73 y.o. with history of ESRD and stroke admitted for psychosis that has improved on hospital day 1.  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 Better mood today. No more reported paranoia or auditory hallucination. Workup for cause essentially negative, wonder if dialysis may have reversed an underlying metabolic encephalopathy that we didn't detect. Will lift IVC. Can continue PRN antipsychotic through discharge but plan to discontinue on discharge. Appreciate psychiatry evaluation. -olanzapine  prn for agitation -discontinue IVC  Active Problems:   Hypertension Improved, continue meds per below. -amlodipine  10 mg daily -hydralazine  75 mg q8 h    ESRD on dialysis (HCC) HD today and tomorrow (off-schedule). S/p 3 L UF. Nephrology following.    Hyperkalemia Resolved with HD.    Chronic pancreatitis (HCC) Asymptomatic. Continue creon . -creon  48000 units bid    Hypoglycemia Resolved.    Hypoxic respiratory failure (HCC) Improved after UF removal. -ambulatory pulse oximetry -PT assessment prior to discharge    COPD (chronic obstructive pulmonary disease) (HCC) Stable, continue inhalers. -Breztri  bid  Resolved Problems:   Hyperkalemia    Hypoglycemia  VTE prophylaxis: heparin  injection 5,000 Units Start: 12/22/23 1400 Diet: regular with 1500 mL restriction IVF: n/a  Code: full PT/OT recommendations: pending recommendations TOC recommendations: n/a Family Update: yes, by phone  Discharge plan: Tomorrow pending PT/OT recommendations   Ozell Kung MD 12/23/2023, 4:36 PM  Pager: [phone]

## 2023-12-23 NOTE — Progress Notes (Addendum)
  [facility] Progress Note   Subjective:  Seen in room  - feels/looks much better today. Clear mentally. No CP/dyspnea. Did fine with HD yesterday - mostly slept through it.   Objective Vitals:   12/23/23 0451 12/23/23 0600 12/23/23 0758 12/23/23 0837  BP: (!) 140/68   (!) 113/55  Pulse: 92  92 (!) 102  Resp: 11  16 18   Temp: 98.7 F (37.1 C)   99.1 F (37.3 C)  TempSrc: Oral   Oral  SpO2: 95%   100%  Weight:  41.6 kg    Height:       Physical Exam General: Frail woman, NAD. [place] O2 in place Heart: RRR; 2/6 murmur Lungs: Bibasilar rales, clear in upper lobes Abdomen: soft Extremities: no LE edema Dialysis Access: L AVF +t/b  Additional Objective Labs: Basic Metabolic Panel: Recent Labs  Lab 12/21/23 1021 12/22/23 0801 12/22/23 1803 12/23/23 0442  NA 133* 135  --  135  K 5.9* 6.5*  --  4.5  CL 93* 91*  --  91*  CO2 24 26  --  26  GLUCOSE 84 69*  --  73  BUN 41* 55*  --  31*  CREATININE 10.57* 12.27*  --  7.58*  CALCIUM  10.1 10.3  --  9.9  PHOS  --   --  4.3 6.7*   Liver Function Tests: Recent Labs  Lab 12/21/23 1021 12/22/23 0801 12/23/23 0442  AST 13* 16  --   ALT 7 8  --   ALKPHOS 64 66  --   BILITOT 0.8 1.0  --   PROT 7.0 7.5  --   ALBUMIN  3.5 3.7 3.5   CBC: Recent Labs  Lab 12/21/23 1021 12/22/23 0801 12/23/23 0442  WBC 6.9 7.7 6.7  NEUTROABS 5.1 5.5  --   HGB 10.8* 11.2* 10.5*  HCT 34.7* 34.0* 31.3*  MCV 91.6 90.9 89.9  PLT 296 286 303   Studies/Results: MR BRAIN WO CONTRAST Result Date: 12/23/2023 CLINICAL DATA:  Mental status change, unknown cause EXAM: MRI HEAD WITHOUT CONTRAST TECHNIQUE: Multiplanar, multiecho pulse sequences of the brain and surrounding structures were obtained without intravenous contrast. COMPARISON:  CT head from earlier today. FINDINGS: Brain: No acute infarction, hemorrhage, hydrocephalus, extra-axial collection or mass lesion. Moderate T2/FLAIR hyperintensities the white matter are compatible with  chronic microvascular ischemic change. Cerebral atrophy. Vascular: Major arterial flow voids are maintained at the skull base. Skull and upper cervical spine: Normal marrow signal. Sinuses/Orbits: Clear sinuses.  No acute orbital findings. Other: No mastoid effusions. IMPRESSION: 1. No evidence of acute intracranial abnormality. 2. Moderate chronic microvascular ischemic change. Electronically Signed   By: Gilmore GORMAN Molt M.D.   On: 12/23/2023 01:38   CT Head Wo Contrast Result Date: 12/22/2023 CLINICAL DATA:  Provided history: Mental status change, unknown cause. Additional history provided: Fatigue. EXAM: CT HEAD WITHOUT CONTRAST TECHNIQUE: Contiguous axial images were obtained from the base of the skull through the vertex without intravenous contrast. RADIATION DOSE REDUCTION: This exam was performed according to the departmental dose-optimization program which includes automated exposure control, adjustment of the mA and/or kV according to patient size and/or use of iterative reconstruction technique. COMPARISON:  Brain MRI 06/14/2017. FINDINGS: Brain: Generalized cerebral atrophy. Patchy and ill-defined hypoattenuation within the cerebral white matter, nonspecific but compatible with moderate chronic small vessel ischemic disease. There is no acute intracranial hemorrhage. No demarcated cortical infarct. No extra-axial fluid collection. No evidence of an intracranial mass. No midline shift. Vascular: No hyperdense vessel.  Atherosclerotic calcifications. Skull: No calvarial fracture or aggressive osseous lesion. Sinuses/Orbits: No mass or acute finding within the imaged orbits. No significant paranasal sinus disease at the imaged levels. Other: Left parotid gland is poorly delineated at the imaged levels, atrophic or surgically absent. IMPRESSION: 1. No evidence of an acute intracranial abnormality. 2. Moderate chronic small vessel ischemic changes within the cerebral white matter. 3. Generalized cerebral  atrophy. Electronically Signed   By: Rockey Childs D.O.   On: 12/22/2023 08:26   DG Chest Portable 1 View Result Date: 12/22/2023 CLINICAL DATA:  73 year old female with cough and fatigue. EXAM: PORTABLE CHEST 1 VIEW COMPARISON:  Portable chests yesterday and earlier. FINDINGS: Portable AP semi upright view at 0655 hours. Stable cardiomegaly and mediastinal contours. Calcified aortic atherosclerosis. Visualized tracheal air column is within normal limits. Mildly lower lung volumes. Diffuse increased pulmonary interstitial opacity, symmetric. Patchy increased density at the lung bases more resembling atelectasis. No pneumothorax or pleural effusion. Left axillary vascular stent. No acute osseous abnormality identified. Paucity of bowel gas in the upper abdomen. IMPRESSION: Cardiomegaly with symmetric increased pulmonary interstitial opacity since yesterday suspicious for mild or developing interstitial edema. Mild bibasilar atelectasis. No pleural effusion is evident. Electronically Signed   By: VEAR Hurst M.D.   On: 12/22/2023 07:09   DG Chest 1 View Result Date: 12/21/2023 CLINICAL DATA:  Shortness of breath EXAM: CHEST  1 VIEW COMPARISON:  Chest radiograph dated 12/18/2023 FINDINGS: Partially imaged left axillary vascular stent graft. Normal lung volumes. No focal consolidations. No pleural effusion or pneumothorax. Similar mildly enlarged cardiomediastinal silhouette. No acute osseous abnormality. IMPRESSION: 1.  No focal consolidations. 2. Similar mild cardiomegaly. Electronically Signed   By: Limin  Xu M.D.   On: 12/21/2023 11:38   DG Shoulder Right Result Date: 12/21/2023 CLINICAL DATA:  Right shoulder pain. EXAM: RIGHT SHOULDER - 2+ VIEW COMPARISON:  December 18, 2023. FINDINGS: There is no evidence of fracture or dislocation. There is no evidence of arthropathy or other focal bone abnormality. Soft tissues are unremarkable. IMPRESSION: Negative. Electronically Signed   By: Lynwood Landy Raddle M.D.   On:  12/21/2023 11:32   Medications:  anticoagulant sodium citrate       amLODipine   10 mg Oral QHS   budesonide -glycopyrrolate -formoterol   2 puff Inhalation BID   Chlorhexidine  Gluconate Cloth  6 each Topical Q0600   dextrose   12.5 g Intravenous Once   heparin   5,000 Units Subcutaneous Q8H   hydrALAZINE   75 mg Oral Q8H   lipase/protease/amylase  48,000 Units Oral BID WC   pantoprazole   40 mg Oral Daily   sevelamer  carbonate  800 mg Oral TID WC   sodium zirconium cyclosilicate   10 g Oral Once    Dialysis Orders MWF - [facility] 3:30hr, 350/500, EDW 46kg, 2K/2.5Ca bath, UFP #4, AVF, no heparin  - no ESA, Hgb > 11 - Venofer  50mg  IV q week - Hectoral 4mcg IV q HD - Cuts every HD short to 3hr, leaves ~1kg above EDW   Assessment/Plan:  Hyperkalemia: K 6.5 on admit -> better with HD. ESRD:  Usual MWF schedule - s/p HD yesterday afternoon. Next HD tomorrow, then will get back on usual schedule. If she is medically ready for discharge, we can likely arrange this as outpatient.  AMS: D/t pain? pain meds? MRI without acute finding, much better today.  Hypertension/volume: BP better - continue home meds. Still with rales in B lung bases.  Anemia: Hgb > 11, no ESA for now.  Metabolic bone disease: Ca  high, holding VDRA. Phos up slightly - continue binders.  COPD  HFrEF R shoulder pain s/p fall: No fracture on imaging.   Izetta Boehringer, PA-C 12/23/2023, 11:08 AM  [facility]   Seen and examined independently.  Agree with note and exam as documented above by physician extender and as noted here.  Much more alert today. Conversant. Has a Dance movement psychotherapist adult female in bed in no acute distress HEENT normocephalic atraumatic extraocular movements intact sclera anicteric Neck supple trachea midline Lungs clear to auscultation bilaterally normal work of breathing at rest  Heart S1S2 no rub Abdomen soft nontender nondistended Extremities no edema  Psych normal mood and affect Neuro  alert and oriented x3 provides hx and follows commands Access LUE AVF with bruit and thrill   Hyperkalemia resolved with HD  ESRD - no acute need for HD today; plan for HD tomorrow (here or outpatient)  AMS  - much improved  Disposition per primary team   Katheryn JAYSON Saba, MD 12/23/2023  12:35 PM

## 2023-12-23 NOTE — Consult Note (Signed)
 [facility] Psychiatric Consult Initial  Patient Name: .Ashley [name]  MRN: [id number]  DOB: January 20, 1951  Consult Order details:  Orders (From admission, onward)     Start     Ordered   12/22/23 1129  IP CONSULT TO PSYCHIATRY       Comments: 73 yo w/ ESRD hearing hateful, cruel voices over last week, associated with paranoia. Declining necessary medical therapy like dialysis because she fears hospital staff are going to harm her.  Ordering Provider: Norrine Sharper, MD  Provider:  (Not yet assigned)  Question Answer Comment  Location [facility]   Reason for Consult? 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 female admitted: Medically at 12/22/2023  6:18 AM for increased confusion and hyperkalemia in the setting of missing hemodialysis. She carries the psychiatric diagnoses of none and has a past medical history of ESRD on HD, COPD, PUD, chronic pancreatitis, history of stroke.   Patient presented to hospital with confusion in the setting of electrolyte disturbances as she refused to attend dialysis treatment, which she normally goes to on MWF. Patient reports she refused to go to treatment because she was hearing voices of her family members making comments that she is crazy and believed they were plotting to get her out of their house. On assessment today, patient is noted to be alert, fully oriented, and interactive. Her thought pattern is linear, logical, goal directed. Her cognitive status appears much improved from yesterday. She does endorse auditory hallucinations regarding hearing a man outside of her window, but does not appear to be endorsing paranoid delusions.  She also endorses illusions, but suspect this is likely related to patient being visually impaired and not having her corrective lenses. Patient states that she would like to continue dialysis and understands the importance of continuing this treatment regularly in regards to maintaining good health. Patient also endorses sleep dysregulation, poor appetite over the last 2 to 3 weeks, increased irritability, feelings of hopelessness, and increased anxiety.  Suspect that her current presentation of perceptual disturbances and paranoia in the setting of recent metabolic or electrolyte imbalances is most consistent with psychosis secondary to her medical condition. Given patient's age and lack of psychiatric history, low suspicion for primary psychotic disorder. Also considered a diagnosis of the depression with psychotic features as patient notes depressive symptoms and reports worsening of perceptual disturbances when feeling depressed. As patient requires MWF dialysis, she is not an appropriate candidate for inpatient psych hospitalization. Spoke with patient's daughter this morning, provided updates and discussed safety planning and need for outpatient psychiatric follow-up. Daughter was agreeable with this plan. We will sign off at this time.  Please see plan below for detailed recommendations.   Diagnoses:  Active Hospital problems: Principal Problem:   Psychosis in elderly [facility]) Active Problems:   Hypertension   ESRD on dialysis (HCC)   Hyperkalemia   Chronic pancreatitis (HCC)   Hypoglycemia   Hypoxic respiratory failure (HCC)   COPD (chronic obstructive pulmonary disease) (HCC)    Plan   ## Psychiatric Medication Recommendations:  -Continue agitation protocol per primary team  ## Medical Decision Making Capacity:  Not specifically addressed in this encounter  ## Further Work-up:  -- Per primary team -- most recent EKG on 7/16 had QtC of 447 -- Pertinent labwork reviewed earlier this  admission includes: K 5.9, sodium 133, creatinine 10.57   ## Disposition:-- There are no psychiatric contraindications to discharge at this time  ## Behavioral / Environmental: - No specific recommendations at this time.     ## Safety and Observation Level:  - Based on my clinical evaluation, I estimate the patient to be at low risk of self harm in the current setting. - At this time, we recommend  routine monitoring. This decision is based on my review of the chart including patient's history and current presentation, interview of the patient, mental status examination, and consideration of suicide risk including evaluating suicidal ideation, plan, intent, suicidal or self-harm behaviors, risk factors, and protective factors. This judgment is based on our ability to directly address suicide risk, implement suicide prevention strategies, and develop a safety plan while the patient is in the clinical setting. Please contact our team if there is a concern that risk level has changed.  CSSR Risk Category:C-SSRS RISK CATEGORY: No Risk  Suicide Risk Assessment: Patient has following modifiable risk factors for suicide: under treated depression  and pain, medical illness (ie new dx of cancer), which we are addressing by recommending safety planning and outpatient psychiatric follow-up. Patient has following non-modifiable or demographic risk factors for suicide: separation or divorce Patient has the following protective factors against suicide: Access to outpatient mental health care, Supportive family, Supportive friends, no history of suicide attempts, and no history of NSSIB  Thank you for this consult request. Recommendations have been communicated to the primary team.  We will sign off at this time.   Ashley LOISE Gravely, MD PGY-1       History of Present Illness  Relevant Aspects of [facility] Course:  Admitted on 12/22/2023 for increased confusion, lethargy, and hyperkalemia in the  setting of missing hemodialysis. Patient was noted to be restless and agitated in the ED requiring Ativan .  Patient Report:  Patient reports that she got into an argument with her daughter and granddaughter this past Friday and this is why she skipped dialysis. She reports that she heard voices of her family members saying that she is crazy and plotting to put her out of their shared home. Patient reports that that was the first time she has ever heard voices and denies past psychiatric history.  She reports she has been living with her daughter for the last 4 years.  She reports she heard a man talking outside of her hospital window last night.  She denies CAH, thought insertion, delusions of reference.  She describes seeing small, shadowy figures on the floor.  She reports that she has difficulty falling asleep, decreased appetite, increased irritability, worse hallucinations when feeling depressed.  She denies active SI, HI. She denies feeling as if hospital staff are out to get her.  Psych ROS:  Depression: She reports that she has difficulty falling asleep, decreased appetite, increased irritability, worse hallucinations when feeling depressed.  Anxiety: Reports feeling anxiety about everything Mania (lifetime and current): Denies Psychosis: (lifetime and current): Yes, first time having perceptual disturbances  Collateral information:  Contacted Elgie Maxcy at [phone] on 7/16  -Spoke with patient's daughter this morning, provided updates and discussed safety planning and need for outpatient psychiatric follow-up. Daughter was agreeable with this plan.  Psychiatric and Social History  Psychiatric History:  Information collected from chart review, patient  Prev Dx/Sx:  Patient denies past psychiatric history. Patient denies family history of psychiatric illness.  Social History:  Patient lives with her daughter and granddaughter who recently moved in with them.  Patient is  divorced. She relies on disability for income.  Substance History Patient uses daily tobacco but otherwise denies substance use.  Exam Findings   Vital Signs:  Temp:  [97 F (36.1 C)-99.1 F (37.3 C)] 99.1 F (37.3 C) (07/16 0837) Pulse Rate:  [61-115] 102 (07/16 0837) Resp:  [7-19] 18 (07/16 0837) BP: (91-193)/(55-89) 113/55 (07/16 0837) SpO2:  [95 %-100 %] 100 % (07/16 0837) FiO2 (%):  [28 %] 28 % (07/16 0758) Weight:  [41.6 kg] 41.6 kg (07/16 0600) Blood pressure (!) 113/55, pulse (!) 102, temperature 99.1 F (37.3 C), temperature source Oral, resp. rate 18, height 5' 2 (1.575 m), weight 41.6 kg, SpO2 100%. Body mass index is 16.77 kg/m.  Physical Exam Vitals and nursing note reviewed.  Constitutional:      General: She is not in acute distress.    Appearance: Normal appearance. She is normal weight. She is not ill-appearing.  HENT:     Head: Normocephalic and atraumatic.  Pulmonary:     Effort: Pulmonary effort is normal. No respiratory distress.     Breath sounds: Normal breath sounds.  Musculoskeletal:     Cervical back: Normal range of motion.  Skin:    General: Skin is warm and dry.  Neurological:     Mental Status: She is alert and oriented to person, place, and time.  Psychiatric:        Behavior: Behavior normal.     Mental Status Exam: General Appearance: Casual and Well Groomed  Orientation:  Full (Time, Place, and Person)  Memory:  Immediate;   Good Recent;   Good Remote;   Good  Concentration:  Concentration: Good and Attention Span: Good  Recall:  Good  Attention  Good  Eye Contact:  Good  Speech:  Clear and Coherent and Normal Rate  Language:  Good  Volume:  Normal  Mood: Good  Affect:  Appropriate, Congruent, and Full Range  Thought Process:  Coherent, Goal Directed, and Linear  Thought Content:  Hallucinations: Auditory; reports AH last night; not observed RTIS  Suicidal Thoughts:  No  Homicidal Thoughts:  No  Judgement:  Fair   Insight:  Fair  Psychomotor Activity:  Normal  Akathisia:  Negative  Fund of Knowledge:  Good      Assets:  Housing Resilience Social Support  Cognition:  WNL  ADL's:  Intact  AIMS (if indicated):        Other History   These have been pulled in through the EMR, reviewed, and updated if appropriate.  Family History:  The patient's family history includes Breast cancer in her maternal aunt; Cancer in her father; Early death in her daughter; Hyperlipidemia in her father; Hypertension in her father; Kidney disease in her daughter; Seizures in her sister.  Medical History: Past Medical History:  Diagnosis Date   Acute pancreatitis 2000   2000, 12/2018, 08/2019   Arthritis    Cervical radiculopathy 02/28/2011   Cocaine substance abuse (HCC) 05/26/2013   positive UDS    COPD (chronic obstructive pulmonary disease) (HCC)    COPD (chronic obstructive pulmonary disease) (HCC) 12/22/2023   Duodenitis    Dyspnea    Erosive gastropathy    ESRD on hemodialysis (HCC)    TTS   GERD (gastroesophageal  reflux disease)    Hepatitis C 1987   dt hx IVDA.  genotype 2B.  Epclusa  started early 04/2020.     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  Chronic microvascular ischemic changes w parenchymal volume loss. Chronic white matter periventricular microhemorrhage, likely due to htn   Ulcer 1990   gastric ulcer. Ruptured s/p emergency repair    Surgical History: Past Surgical History:  Procedure Laterality Date   ABDOMINAL HYSTERECTOMY  1979   AV FISTULA PLACEMENT Left 06/16/2017   Procedure: ARTERIOVENOUS (AV) FISTULA CREATION LEFT ARM;  Surgeon: Laurence Redell CROME, MD;  Location: [facility] OR;  Service: Vascular;  Laterality: Left;   BASCILIC  VEIN TRANSPOSITION Left 10/02/2017   Procedure: BASILIC VEIN TRANSPOSITION SECOND STAGE LEFT ARM;  Surgeon: Oris Krystal FALCON, MD;  Location: [facility] OR;  Service: Vascular;  Laterality: Left;   BIOPSY  09/06/2019   Procedure: BIOPSY;  Surgeon: Avram Lupita BRAVO, MD;  Location[facility] ENDOSCOPY;  Service: Endoscopy;;   BIOPSY  03/13/2023   Procedure: BIOPSY;  Surgeon: San Sandor GAILS, DO;  Location: [facility] ENDOSCOPY;  Service: Gastroenterology;;   COLONOSCOPY WITH PROPOFOL  N/A 03/13/2023   Procedure: COLONOSCOPY WITH PROPOFOL ;  Surgeon: San Sandor GAILS, DO;  Location: [facility] ENDOSCOPY;  Service: Gastroenterology;  Laterality: N/A;   ENTEROSCOPY N/A 10/22/2022   Procedure: ENTEROSCOPY;  Surgeon: Legrand Victory CROME DOUGLAS, MD;  Location: [facility] ENDOSCOPY;  Service: Gastroenterology;  Laterality: N/A;   ESOPHAGOGASTRODUODENOSCOPY N/A 05/29/2013   Procedure: ESOPHAGOGASTRODUODENOSCOPY (EGD);  Surgeon: Gordy CHRISTELLA Starch, MD;  Location: [facility] ENDOSCOPY;  Service: Endoscopy;  Laterality: N/A;   ESOPHAGOGASTRODUODENOSCOPY  05/2013   for epigastric pain.  Nonobstructing Schatzki ring at GEJ, mild gastropathy, nonbleeding AVMs in bulb and D2. 5 mm sessile polyp in bulb.   ESOPHAGOGASTRODUODENOSCOPY (EGD) WITH PROPOFOL  N/A 09/06/2019   Procedure: ESOPHAGOGASTRODUODENOSCOPY (EGD) WITH PROPOFOL ;  Surgeon: Avram Lupita BRAVO, MD;  Location: [facility] ENDOSCOPY;  Service: Endoscopy;  Laterality: N/A;   ESOPHAGOGASTRODUODENOSCOPY (EGD) WITH PROPOFOL  N/A 02/02/2020   Procedure: ESOPHAGOGASTRODUODENOSCOPY (EGD) WITH PROPOFOL ;  Surgeon: Teressa Toribio SQUIBB, MD;  Location: [facility] ENDOSCOPY;  Service: Endoscopy;  Laterality: N/A;   ESOPHAGOGASTRODUODENOSCOPY (EGD) WITH PROPOFOL  N/A 02/07/2021   Procedure: ESOPHAGOGASTRODUODENOSCOPY (EGD) WITH PROPOFOL ;  Surgeon: Teressa Toribio SQUIBB, MD;  Location: [facility] ENDOSCOPY;  Service: Endoscopy;  Laterality: N/A;   ESOPHAGOGASTRODUODENOSCOPY (EGD) WITH PROPOFOL  N/A 03/13/2023   Procedure: ESOPHAGOGASTRODUODENOSCOPY (EGD) WITH  PROPOFOL ;  Surgeon: San Sandor GAILS, DO;  Location: [facility] ENDOSCOPY;  Service: Gastroenterology;  Laterality: N/A;   EUS N/A 02/02/2020   Procedure: UPPER ENDOSCOPIC ULTRASOUND (EUS) RADIAL;  Surgeon: Teressa Toribio SQUIBB, MD;  Location: [facility] ENDOSCOPY;  Service: Endoscopy;  Laterality: N/A;   EUS N/A 08/20/2023   Procedure: UPPER ENDOSCOPIC ULTRASOUND (EUS) RADIAL;  Surgeon: Wilhelmenia Aloha Raddle., MD;  Location: [facility] ENDOSCOPY;  Service: Gastroenterology;  Laterality: N/A;   EXCHANGE OF A DIALYSIS CATHETER Left 07/31/2017   Procedure: Removal  OF A  Right GroinTUNNELED  DIALYSIS CATHETER ,  Insertion of Left Femoral Dialysis Catheter.;  Surgeon: Oris Krystal FALCON, MD;  Location: [facility] OR;  Service: Vascular;  Laterality: Left;   FISTULOGRAM Left 11/19/2023   Procedure: FISTULOGRAM WITH ANGIOPLASTY OF FISTULA;  Surgeon: Magda Debby SAILOR, MD;  Location: [facility]  OR;  Service: Vascular;  Laterality: Left;   HEMOSTASIS CLIP PLACEMENT  02/07/2021   Procedure: HEMOSTASIS CLIP PLACEMENT;  Surgeon: Teressa Toribio SQUIBB, MD;  Location: [facility] ENDOSCOPY;  Service: Endoscopy;;   HEMOSTASIS CLIP PLACEMENT  10/22/2022   Procedure: HEMOSTASIS CLIP PLACEMENT;  Surgeon: Legrand Victory LITTIE DOUGLAS, MD;  Location: [facility] ENDOSCOPY;  Service: Gastroenterology;;   HEMOSTASIS CLIP PLACEMENT  03/13/2023   Procedure: HEMOSTASIS CLIP PLACEMENT;  Surgeon: San Sandor GAILS, DO;  Location: [facility] ENDOSCOPY;  Service: Gastroenterology;;   HEMOSTASIS CLIP PLACEMENT  08/20/2023   Procedure: CONTROL OF HEMORRHAGE, GI TRACT, ENDOSCOPIC, BY CLIPPING OR OVERSEWING;  Surgeon: Wilhelmenia Aloha Raddle., MD;  Location: [facility] ENDOSCOPY;  Service: Gastroenterology;;   HOT HEMOSTASIS N/A 09/06/2019   Procedure: HOT HEMOSTASIS (ARGON PLASMA COAGULATION/BICAP);  Surgeon: Avram Lupita BRAVO, MD;  Location: [facility] ENDOSCOPY;  Service: Endoscopy;  Laterality: N/A;   HOT HEMOSTASIS N/A 02/07/2021   Procedure: HOT HEMOSTASIS (ARGON PLASMA COAGULATION/BICAP);  Surgeon: Teressa Toribio SQUIBB, MD;   Location: [facility] ENDOSCOPY;  Service: Endoscopy;  Laterality: N/A;   HOT HEMOSTASIS N/A 10/22/2022   Procedure: HOT HEMOSTASIS (ARGON PLASMA COAGULATION/BICAP);  Surgeon: Legrand Victory LITTIE DOUGLAS, MD;  Location: [facility] ENDOSCOPY;  Service: Gastroenterology;  Laterality: N/A;   HOT HEMOSTASIS N/A 08/20/2023   Procedure: EGD, WITH ARGON PLASMA COAGULATION;  Surgeon: Wilhelmenia Aloha Raddle., MD;  Location: [facility] ENDOSCOPY;  Service: Gastroenterology;  Laterality: N/A;   INSERTION OF DIALYSIS CATHETER Right 06/16/2017   Procedure: INSERTION OF DIALYSIS CATHETER;  Surgeon: Laurence Redell LITTIE, MD;  Location[facility] OR;  Service: Vascular;  Laterality: Right;   IR AV DIALY SHUNT INTRO NEEDLE/INTRACATH INITIAL W/PTA/IMG LEFT  06/21/2018   POLYPECTOMY  03/13/2023   Procedure: POLYPECTOMY;  Surgeon: San Sandor GAILS, DO;  Location: [facility] ENDOSCOPY;  Service: Gastroenterology;;   POLYPECTOMY  08/20/2023   Procedure: POLYPECTOMY;  Surgeon: Wilhelmenia Aloha Raddle., MD;  Location: [facility] ENDOSCOPY;  Service: Gastroenterology;;   REPAIR OF PERFORATED ULCER  1990   gastric ulcer   SCLEROTHERAPY  10/22/2022   Procedure: MATIAS;  Surgeon: Legrand Victory LITTIE DOUGLAS, MD;  Location: [facility] ENDOSCOPY;  Service: Gastroenterology;;   SUBMUCOSAL TATTOO INJECTION  10/22/2022   Procedure: SUBMUCOSAL TATTOO INJECTION;  Surgeon: Legrand Victory LITTIE DOUGLAS, MD;  Location: [facility] ENDOSCOPY;  Service: Gastroenterology;;     Medications:   Current Facility-Administered Medications:    amLODipine  (NORVASC ) tablet 10 mg, 10 mg, Oral, QHS, McLendon, Michael, MD, 10 mg at 12/22/23 2207   budesonide -glycopyrrolate -formoterol  (BREZTRI ) 160-9-4.8 MCG/ACT inhaler 2 puff, 2 puff, Inhalation, BID, McLendon, Michael, MD, 2 puff at 12/23/23 0758   Chlorhexidine  Gluconate Cloth 2 % PADS 6 each, 6 each, Topical, Q0600, Norrine Sharper, MD, 6 each at 12/23/23 9385   dextrose  50 % solution 12.5 g, 12.5 g, Intravenous, Once, Norrine Sharper, MD   heparin  injection 1,000 Units,  1,000 Units, Intracatheter, PRN, McLendon, Michael, MD   heparin  injection 5,000 Units, 5,000 Units, Subcutaneous, Q8H, McLendon, Michael, MD, 5,000 Units at 12/23/23 9385   hydrALAZINE  (APRESOLINE ) tablet 75 mg, 75 mg, Oral, Q8H, McLendon, Michael, MD, 75 mg at 12/23/23 9385   lipase/protease/amylase (CREON ) capsule 48,000 Units, 48,000 Units, Oral, BID WC, McLendon, Michael, MD, 48,000 Units at 12/23/23 9164   OLANZapine  (ZYPREXA ) tablet 5 mg, 5 mg, Oral, Q6H PRN **OR** OLANZapine  (ZYPREXA ) injection 5 mg, 5 mg, Intramuscular, Q6H PRN, Marry Clamp, MD   pantoprazole  (PROTONIX ) EC tablet 40 mg, 40 mg, Oral, Daily, McLendon, Michael, MD, 40 mg at 12/23/23 0835   sevelamer  carbonate (  RENVELA ) tablet 800 mg, 800 mg, Oral, TID WC, McLendon, Michael, MD, 800 mg at 12/23/23 0835   sodium zirconium cyclosilicate  (LOKELMA ) packet 10 g, 10 g, Oral, Once, Norrine Sharper, MD  Allergies: Allergies  Allergen Reactions   Aspirin  Nausea And Vomiting    Stomach ache   Ibuprofen Nausea And Vomiting    Stomach ache    Ashley LOISE Gravely, MD PGY-1

## 2023-12-23 NOTE — Care Management Obs Status (Signed)
 MEDICARE OBSERVATION STATUS NOTIFICATION   Patient Details  Name: Ashley [name] MRN: [id number] Date of Birth: 21-Sep-1950   Medicare Observation Status Notification Given:    Due to illness paient was not able to signed  but understood ex[;explanation of her obs status   Tyler Robidoux 12/23/2023, 9:58 AM

## 2023-12-23 NOTE — Evaluation (Signed)
 Physical Therapy Evaluation Patient Details Name: Ashley [name] MRN: [id number] DOB: 11/19/1950 Today's Date: 12/23/2023  History of Present Illness  Pt is a 73 y.o. F who presents 12/22/2023 after a fall and confusion. Admitted for psychotic symptoms. Psychiatry evaluated and recommended outpatient follow up. Xray R shoulder negative for fracture on 7/14. Significant PMH: ESRD on HD MWF, COPD, PUD, chronic pancreatitis, stroke.  Clinical Impression  Pt admitted with above. Presents with R triceps pain and pain with active extension, decreased R shoulder flexion AROM/PROM, impaired standing balance and decreased endurance. Pt ambulating 100 ft with a walker and CGA. Reports typically wears her tennis shoes due to history of peripheral neuropathy. PTA, was living with her daughter and has SCAT for transport to HD. Would benefit from follow up HHPT to address deficits.      If plan is discharge home, recommend the following: A little help with walking and/or transfers;A little help with bathing/dressing/bathroom;Assistance with cooking/housework;Assist for transportation;Help with stairs or ramp for entrance;Supervision due to cognitive status   Can travel by private vehicle        Equipment Recommendations None recommended by PT  Recommendations for Other Services       Functional Status Assessment Patient has had a recent decline in their functional status and demonstrates the ability to make significant improvements in function in a reasonable and predictable amount of time.     Precautions / Restrictions Precautions Precautions: Fall Restrictions Weight Bearing Restrictions Per Provider Order: No      Mobility  Bed Mobility Overal bed mobility: Modified Independent                  Transfers Overall transfer level: Needs assistance Equipment used: Rolling walker (2 wheels) Transfers: Sit to/from Stand Sit to Stand: Supervision                 Ambulation/Gait Ambulation/Gait assistance: Contact guard assist Gait Distance (Feet): 100 Feet Assistive device: Rolling walker (2 wheels) Gait Pattern/deviations: Step-through pattern, Decreased stride length, Decreased dorsiflexion - right, Decreased dorsiflexion - left Gait velocity: CGA for safety, decreased bilateral heel strike (hx of peripheral neuropathy), dynamic unsteadiness Gait velocity interpretation: <1.8 ft/sec, indicate of risk for recurrent falls      Stairs            Wheelchair Mobility     Tilt Bed    Modified Rankin (Stroke Patients Only)       Balance Overall balance assessment: Needs assistance Sitting-balance support: Feet supported Sitting balance-Leahy Scale: Good     Standing balance support: Bilateral upper extremity supported, Reliant on assistive device for balance Standing balance-Leahy Scale: Poor                               Pertinent Vitals/Pain Pain Assessment Pain Assessment: Faces Faces Pain Scale: Hurts little more Pain Location: R triceps Pain Descriptors / Indicators: Grimacing, Guarding Pain Intervention(s): Monitored during session    Home Living Family/patient expects to be discharged to:: Private residence Living Arrangements: Children Available Help at Discharge: Family;Available 24 hours/day Type of Home: House Home Access: Stairs to enter Entrance Stairs-Rails: Right Entrance Stairs-Number of Steps: flight   Home Layout: One level Home Equipment: Educational psychologist (4 wheels)      Prior Function Prior Level of Function : Needs assist             Mobility Comments: uses RW vs cane ADLs Comments: SCAT  to HD, daughter can assist with IADL's     Extremity/Trunk Assessment   Upper Extremity Assessment Upper Extremity Assessment: Defer to OT evaluation    Lower Extremity Assessment Lower Extremity Assessment: Generalized weakness;RLE deficits/detail;LLE deficits/detail RLE  Sensation: history of peripheral neuropathy LLE Sensation: history of peripheral neuropathy    Cervical / Trunk Assessment Cervical / Trunk Assessment: Kyphotic  Communication   Communication Communication: No apparent difficulties    Cognition Arousal: Alert Behavior During Therapy: WFL for tasks assessed/performed   PT - Cognitive impairments: Orientation   Orientation impairments: Time, Place                   PT - Cognition Comments: Pt oriented to year, not month, stating, it's the 6th or 7th one. Pt also not aware of which hospital she was in. Pt then stating, I guess I'm still confused aren't I? Following commands: Intact       Cueing       General Comments      Exercises     Assessment/Plan    PT Assessment Patient needs continued PT services  PT Problem List Decreased strength;Decreased activity tolerance;Decreased balance;Decreased mobility;Decreased cognition;Pain       PT Treatment Interventions DME instruction;Gait training;Stair training;Functional mobility training;Therapeutic activities;Therapeutic exercise;Balance training;Patient/family education    PT Goals (Current goals can be found in the Care Plan section)  Acute Rehab PT Goals Patient Stated Goal: go home PT Goal Formulation: With patient Time For Goal Achievement: 01/06/24 Potential to Achieve Goals: Fair    Frequency Min 2X/week     Co-evaluation               AM-PAC PT 6 Clicks Mobility  Outcome Measure Help needed turning from your back to your side while in a flat bed without using bedrails?: None Help needed moving from lying on your back to sitting on the side of a flat bed without using bedrails?: None Help needed moving to and from a bed to a chair (including a wheelchair)?: A Little Help needed standing up from a chair using your arms (e.g., wheelchair or bedside chair)?: A Little Help needed to walk in hospital room?: A Little Help needed climbing 3-5  steps with a railing? : A Lot 6 Click Score: 19    End of Session Equipment Utilized During Treatment: Gait belt Activity Tolerance: Patient tolerated treatment well Patient left: in bed;with call bell/phone within reach;with nursing/sitter in room Nurse Communication: Mobility status PT Visit Diagnosis: Unsteadiness on feet (R26.81);History of falling (Z91.81)    Time: 8444-8383 PT Time Calculation (min) (ACUTE ONLY): 21 min   Charges:   PT Evaluation $PT Eval Low Complexity: 1 Low   PT General Charges $$ ACUTE PT VISIT: 1 Visit         Aleck [name], PT, DPT Acute Rehabilitation Services Office [phone]   Ashley [name] 12/23/2023, 4:31 PM

## 2023-12-24 DIAGNOSIS — F0390 Unspecified dementia without behavioral disturbance: Secondary | ICD-10-CM | POA: Diagnosis not present

## 2023-12-24 DIAGNOSIS — J449 Chronic obstructive pulmonary disease, unspecified: Secondary | ICD-10-CM | POA: Diagnosis not present

## 2023-12-24 DIAGNOSIS — N186 End stage renal disease: Secondary | ICD-10-CM | POA: Diagnosis not present

## 2023-12-24 DIAGNOSIS — J9620 Acute and chronic respiratory failure, unspecified whether with hypoxia or hypercapnia: Secondary | ICD-10-CM | POA: Diagnosis not present

## 2023-12-24 LAB — RENAL FUNCTION PANEL
Albumin: 3.4 g/dL — ABNORMAL LOW (ref 3.5–5.0)
Anion gap: 16 — ABNORMAL HIGH (ref 5–15)
BUN: 46 mg/dL — ABNORMAL HIGH (ref 8–23)
CO2: 27 mmol/L (ref 22–32)
Calcium: 10 mg/dL (ref 8.9–10.3)
Chloride: 92 mmol/L — ABNORMAL LOW (ref 98–111)
Creatinine, Ser: 10.26 mg/dL — ABNORMAL HIGH (ref 0.44–1.00)
GFR, Estimated: 4 mL/min — ABNORMAL LOW (ref >60)
Glucose, Bld: 109 mg/dL — ABNORMAL HIGH (ref 70–99)
Phosphorus: 6.9 mg/dL — ABNORMAL HIGH (ref 2.5–4.6)
Potassium: 4.1 mmol/L (ref 3.5–5.1)
Sodium: 135 mmol/L (ref 135–145)

## 2023-12-24 MED ORDER — ACETAMINOPHEN 325 MG PO TABS
650.0000 mg | ORAL_TABLET | Freq: Four times a day (QID) | ORAL | Status: DC | PRN
  Administered 2023-12-24 (×2): 650 mg via ORAL
  Filled 2023-12-24 (×2): qty 2

## 2023-12-24 MED ORDER — LIDOCAINE 5 % EX PTCH: 1.0000 | MEDICATED_PATCH | Freq: Two times a day (BID) | CUTANEOUS | 0 refills | Status: DC | PRN

## 2023-12-24 MED ORDER — LIDOCAINE 5 % EX PTCH
1.0000 | MEDICATED_PATCH | Freq: Every day | CUTANEOUS | Status: DC
  Administered 2023-12-24: 1 via TRANSDERMAL
  Filled 2023-12-24: qty 1

## 2023-12-24 MED ORDER — ACETAMINOPHEN 325 MG PO TABS: 650.0000 mg | ORAL_TABLET | Freq: Four times a day (QID) | ORAL | Status: DC | PRN

## 2023-12-24 NOTE — Discharge Planning (Signed)
 [facility] Patient Dialysis Discharge Orders - [facility] CLINIC: [facility]  Patient's name: Ashley [name] Admit/DC Dates: 12/22/2023 - 12/24/23  DISCHARGE DIAGNOSES: Hyperkalemia - resolved with HD  AMS/hallucinations - resolved with HD, brain MRI negative R shoulder pain s/p fall - no fracture  HD ORDER CHANGES: Heparin  change: no EDW Change: YES New EDW: 45.5kg Bath Change: no  ANEMIA MANAGEMENT: Aranesp : Given: no   ESA dose for discharge: per protocol IV Iron  dose at discharge: per protocol Transfusion: Given: no  BONE/MINERAL MEDICATIONS: Hectorol /Calcitriol  change: Hold hectoral for hypercalcemia Sensipar /Parsabiv change: no  ACCESS INTERVENTION/CHANGE: no Details:  RECENT LABS: Recent Labs  Lab 12/23/23 0442 12/24/23 0507  HGB 10.5*  --   NA 135 135  K 4.5 4.1  CALCIUM  9.9 10.0  PHOS 6.7* 6.9*  ALBUMIN  3.5 3.4*    IV ANTIBIOTICS: no Details:  OTHER ANTICOAGULATION: no   OTHER/APPTS/LAB ORDERS: - Repeat Ca, K this week - decide if ok to resume VDRA - Off schedule for HD this week - dialyzed at [facility] on Tuesday -> resume home HD Fri 7/18  D/C Meds to be reconciled by nurse after every discharge.  Completed By: Izetta Boehringer, PA-C [facility] Pager [phone]   Reviewed by: MD:______ RN_______

## 2023-12-24 NOTE — Care Management Obs Status (Signed)
 MEDICARE OBSERVATION STATUS NOTIFICATION   Patient Details  Name: Ashley [name] MRN: [id number] Date of Birth: 1950-07-15   Medicare Observation Status Notification Given:  Yes    Jamien Casanova Crawford 12/24/2023, 8:24 AM

## 2023-12-24 NOTE — ED Notes (Signed)
 IVC has been recinded, case #[id number], envelope # [id number] (for recind)

## 2023-12-24 NOTE — Progress Notes (Addendum)
 Advised by team of pt's likely d/c today. Contacted [facility] to be advised of plan for likely d/c today and that pt should resume care tomorrow.   Randine Mungo Renal Navigator [phone]  Addendum at 2:21 pm: Contacted pt's clinic to be advised that pt will d/c today and should resume care tomorrow.

## 2023-12-24 NOTE — Hospital Course (Addendum)
 Acute Psychosis The pt was admitted to [facility] on 7/15 after her daughter noticed that she was acting strangely. She described paranoia, auditory and visual hallucinations. Work up with MRI, RPR, HIV, and TSH were unremarkable. She was seen by nephrology due to ESRD on HD and underwent HD that night. On 7/16 her mental status had significantly improved. She reports still having some hallucinations but they were not as severe. She was evaluated by psychiatry who did not think she needed intervention or medication adjustment at this time. OT/PT were consulted at the request of her daughter and [facility] was recommended for both. On 7/17 she was deemed safe for discharge.

## 2023-12-24 NOTE — Progress Notes (Addendum)
 [facility] Progress Note   Subjective:  Seen in room - feels ok today. No dyspnea - on room air. Mentally clear today. Original plan had been HD today as outpatient, but d/c was delayed pending PT/OT recs. Since she looks good from volume standpoint and labs are ok - will cancel HD today, can resume HD tomorrow per usual schedule. Still has some R shoulder pain - lidocaine  patch in place.  Objective Vitals:   12/23/23 2130 12/24/23 0530 12/24/23 0814 12/24/23 0913  BP: 131/62 (!) 149/72 (!) 158/71   Pulse: 95 84 85   Resp: 13 16 18    Temp: 97.9 F (36.6 C) 98.1 F (36.7 C) 98.2 F (36.8 C)   TempSrc: Oral Oral Oral   SpO2: 99% 96% 99% 96%  Weight:  44.9 kg    Height:       Physical Exam General: Frail woman, NAD. Room air Heart: RRR; 2/6 murmur Lungs: Scattered rhonchi, no rales Abdomen: soft Extremities: no LE edema Dialysis Access: L AVF +t/b  Additional Objective Labs: Basic Metabolic Panel: Recent Labs  Lab 12/22/23 0801 12/22/23 1803 12/23/23 0442 12/24/23 0507  NA 135  --  135 135  K 6.5*  --  4.5 4.1  CL 91*  --  91* 92*  CO2 26  --  26 27  GLUCOSE 69*  --  73 109*  BUN 55*  --  31* 46*  CREATININE 12.27*  --  7.58* 10.26*  CALCIUM  10.3  --  9.9 10.0  PHOS  --  4.3 6.7* 6.9*   Liver Function Tests: Recent Labs  Lab 12/21/23 1021 12/22/23 0801 12/23/23 0442 12/24/23 0507  AST 13* 16  --   --   ALT 7 8  --   --   ALKPHOS 64 66  --   --   BILITOT 0.8 1.0  --   --   PROT 7.0 7.5  --   --   ALBUMIN  3.5 3.7 3.5 3.4*   CBC: Recent Labs  Lab 12/21/23 1021 12/22/23 0801 12/23/23 0442  WBC 6.9 7.7 6.7  NEUTROABS 5.1 5.5  --   HGB 10.8* 11.2* 10.5*  HCT 34.7* 34.0* 31.3*  MCV 91.6 90.9 89.9  PLT 296 286 303   Studies/Results: MR BRAIN WO CONTRAST Result Date: 12/23/2023 CLINICAL DATA:  Mental status change, unknown cause EXAM: MRI HEAD WITHOUT CONTRAST TECHNIQUE: Multiplanar, multiecho pulse sequences of the brain and  surrounding structures were obtained without intravenous contrast. COMPARISON:  CT head from earlier today. FINDINGS: Brain: No acute infarction, hemorrhage, hydrocephalus, extra-axial collection or mass lesion. Moderate T2/FLAIR hyperintensities the white matter are compatible with chronic microvascular ischemic change. Cerebral atrophy. Vascular: Major arterial flow voids are maintained at the skull base. Skull and upper cervical spine: Normal marrow signal. Sinuses/Orbits: Clear sinuses.  No acute orbital findings. Other: No mastoid effusions. IMPRESSION: 1. No evidence of acute intracranial abnormality. 2. Moderate chronic microvascular ischemic change. Electronically Signed   By: Gilmore GORMAN Molt M.D.   On: 12/23/2023 01:38   Medications:   amLODipine   10 mg Oral QHS   budesonide -glycopyrrolate -formoterol   2 puff Inhalation BID   Chlorhexidine  Gluconate Cloth  6 each Topical Q0600   heparin   5,000 Units Subcutaneous Q8H   hydrALAZINE   75 mg Oral Q8H   lidocaine   1 patch Transdermal Q0600   lipase/protease/amylase  48,000 Units Oral BID WC   pantoprazole   40 mg Oral Daily   sevelamer  carbonate  800 mg Oral TID WC  sodium zirconium cyclosilicate   10 g Oral Once    Dialysis Orders MWF - [facility] 3:30hr, 350/500, EDW 46kg, 2K/2.5Ca bath, UFP #4, AVF, no heparin  - no ESA, Hgb > 11 - Venofer  50mg  IV q week - Hectoral 4mcg IV q HD - Cuts every HD short to 3hr, leaves ~1kg above EDW   Assessment/Plan:  Hyperkalemia: K 6.5 on admit -> better with HD.  ESRD:  Usual MWF schedule - off schedule this week, was dialyzed Tuesday. Next HD tomorrow (regular schedule) - likely as outpatient.  AMS: D/t pain? pain meds? MRI without acute finding, much better now.  Hypertension/volume: BP better - continue home meds. Volume improving. She is below prior EDW, lower on d/c.  Anemia: Hgb > 11, no ESA for now.  Metabolic bone disease: Ca high, holding VDRA. Phos up slightly - continue binders.  COPD   HFrEF R shoulder pain s/p fall: No fracture on imaging. Using lidocaine  patch.   Izetta Boehringer, PA-C 12/24/2023, 10:06 AM  [facility]   Seen and examined independently.  Agree with note and exam as documented above by physician extender and as noted here.  Much more alert today. Conversant. Has a Freight forwarder adult female in bed in no acute distress HEENT normocephalic atraumatic extraocular movements intact sclera anicteric Neck supple trachea midline Lungs clear to auscultation bilaterally normal work of breathing at rest  Heart S1S2 no rub Abdomen soft nontender nondistended Extremities no edema  Psych normal mood and affect Neuro alert and oriented x3 provides hx and follows commands Access LUE AVF with bruit and thrill    Hyperkalemia resolved with HD   ESRD - no acute need for HD today.  HD tomorrow outpatient per her regular MWF schedule   AMS  - resolved  - sedating meds in ER contributed and improved with HD  Disposition per primary team     Katheryn JAYSON Saba, MD 12/24/2023  2:17 PM

## 2023-12-24 NOTE — Evaluation (Signed)
 Occupational Therapy Evaluation Patient Details Name: Ashley [name] MRN: [id number] DOB: February 02, 1951 Today's Date: 12/24/2023   History of Present Illness   Pt is a 73 y.o. F who presents 12/22/2023 after a fall and confusion. Admitted for psychotic symptoms. Psychiatry evaluated and recommended outpatient follow up. Xray R shoulder negative for fracture on 7/14. Significant PMH: ESRD on HD MWF, COPD, PUD, chronic pancreatitis, stroke.     Clinical Impressions Pt lives with her daughter who works from home. She typically walks with a cane, but is agreeable to using her rollator when she returns home until her R shoulder pain improves. Pt is independent in self care and assisted by her daughter for IADLs. She endorses baseline memory deficits. Pt educated in home exercise program for R shoulder and compensatory strategies for ADLs. Provided written handouts to reinforce. Pt unable to tolerate icing shoulder. Recommended pt seek further evaluation of shoulder if pain persists and HHOT.      If plan is discharge home, recommend the following:   A little help with walking and/or transfers;A little help with bathing/dressing/bathroom;Assistance with cooking/housework;Assist for transportation;Help with stairs or ramp for entrance     Functional Status Assessment   Patient has had a recent decline in their functional status and demonstrates the ability to make significant improvements in function in a reasonable and predictable amount of time.     Equipment Recommendations   None recommended by OT     Recommendations for Other Services         Precautions/Restrictions   Precautions Precautions: Fall Restrictions Weight Bearing Restrictions Per Provider Order: No     Mobility Bed Mobility Overal bed mobility: Modified Independent                  Transfers Overall transfer level: Needs assistance Equipment used: Rolling walker (2 wheels) Transfers: Sit to/from  Stand Sit to Stand: Supervision           General transfer comment: cues for hand placement      Balance Overall balance assessment: Needs assistance   Sitting balance-Leahy Scale: Good     Standing balance support: Bilateral upper extremity supported, Reliant on assistive device for balance Standing balance-Leahy Scale: Poor                             ADL either performed or assessed with clinical judgement   ADL Overall ADL's : Needs assistance/impaired Eating/Feeding: Set up;Sitting;Bed level   Grooming: Oral care;Wash/dry hands;Wash/dry face;Standing;Contact guard assist   Upper Body Bathing: Minimal assistance;Sitting   Lower Body Bathing: Minimal assistance;Sit to/from stand   Upper Body Dressing : Minimal assistance;Sitting   Lower Body Dressing: Minimal assistance;Sit to/from stand   Toilet Transfer: Contact guard assist;Ambulation   Toileting- Architect and Hygiene: Contact guard assist;Sit to/from stand       Functional mobility during ADLs: Contact guard assist;Rolling walker (2 wheels) General ADL Comments: Educated in compensatory strategies for ADLs, lap slided, pendulum, AAROM R shoulder and keeping pillow under elbow in bed and chair.     Vision Baseline Vision/History: 1 Wears glasses Ability to See in Adequate Light: 0 Adequate Patient Visual Report: No change from baseline       Perception         Praxis         Pertinent Vitals/Pain Pain Assessment Pain Assessment: 0-10 Faces Pain Scale: Hurts whole lot Pain Location: R shoulder with ROM Pain Descriptors /  Indicators: Grimacing, Guarding Pain Intervention(s): Monitored during session, Repositioned     Extremity/Trunk Assessment Upper Extremity Assessment Upper Extremity Assessment: Right hand dominant;RUE deficits/detail RUE Deficits / Details: shoulder pain, full PROM, intact elbow to hand RUE Coordination: decreased gross motor   Lower Extremity  Assessment Lower Extremity Assessment: Defer to PT evaluation   Cervical / Trunk Assessment Cervical / Trunk Assessment: Kyphotic   Communication Communication Communication: No apparent difficulties   Cognition Arousal: Alert Behavior During Therapy: WFL for tasks assessed/performed Cognition: No family/caregiver present to determine baseline             OT - Cognition Comments: impaired short term memory, pt reports is baseline                 Following commands: Intact       Cueing  General Comments   Cueing Techniques: Verbal cues      Exercises     Shoulder Instructions      Home Living Family/patient expects to be discharged to:: Private residence Living Arrangements: Children Available Help at Discharge: Family;Available 24 hours/day Type of Home: House Home Access: Stairs to enter [employer] of Steps: flight Entrance Stairs-Rails: Right Home Layout: One level     Bathroom Shower/Tub: Chief Strategy Officer: Standard     Home Equipment: Educational psychologist (4 wheels);Cane - quad          Prior Functioning/Environment Prior Level of Function : Needs assist             Mobility Comments: uses RW vs cane ADLs Comments: SCAT to HD, independent in self care, assisted for IADLs, daughter works from home    OT Problem List: Impaired balance (sitting and/or standing);Decreased cognition;Decreased coordination;Impaired UE functional use;Pain   OT Treatment/Interventions:        OT Goals(Current goals can be found in the care plan section)       OT Frequency:       Co-evaluation              AM-PAC OT 6 Clicks Daily Activity     Outcome Measure Help from another person eating meals?: None Help from another person taking care of personal grooming?: A Little Help from another person toileting, which includes using toliet, bedpan, or urinal?: A Little Help from another person bathing (including  washing, rinsing, drying)?: A Little Help from another person to put on and taking off regular upper body clothing?: A Little Help from another person to put on and taking off regular lower body clothing?: A Little 6 Click Score: 19   End of Session Equipment Utilized During Treatment: Gait belt;Rolling walker (2 wheels)  Activity Tolerance: Patient tolerated treatment well Patient left: in bed;with call bell/phone within reach  OT Visit Diagnosis: Unsteadiness on feet (R26.81);Muscle weakness (generalized) (M62.81);Other symptoms and signs involving cognitive function;Pain Pain - Right/Left: Right Pain - part of body: Shoulder                Time: 8941-8855 OT Time Calculation (min): 46 min Charges:  OT General Charges $OT Visit: 1 Visit OT Evaluation $OT Eval Low Complexity: 1 Low OT Treatments $Self Care/Home Management : 8-22 mins $Therapeutic Exercise: 8-22 mins  Mliss HERO, OTR/L Acute Rehabilitation Services Office: [phone]   Kennth Mliss Helling 12/24/2023, 11:51 AM

## 2023-12-24 NOTE — Discharge Summary (Signed)
 Name: Ashley [name] MRN: [id number] DOB: 02-14-1951 73 y.o. PCP: [facility] [address]  Date of Admission: 12/22/2023  6:18 AM Date of Discharge: 12/24/2023 Attending Physician: Dr. Dayton Eastern  Discharge Diagnosis: 1. 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) Active Problems:   Hypertension   ESRD on dialysis (HCC)   Chronic pancreatitis (HCC)   Hypoxic respiratory failure (HCC)   COPD (chronic obstructive pulmonary disease) (HCC)    Discharge Medications: Allergies as of 12/24/2023       Reactions   Aspirin  Nausea And Vomiting   Stomach ache   Ibuprofen Nausea And Vomiting   Stomach ache        Medication List     STOP taking these medications    HYDROcodone -acetaminophen  5-325 MG tablet Commonly known as: NORCO/VICODIN   methocarbamol  500 MG tablet Commonly known as: ROBAXIN        TAKE these medications    acetaminophen  325 MG tablet Commonly known as: TYLENOL  Take 2 tablets (650 mg total) by mouth every 6 (six) hours as needed for moderate pain (pain score 4-6).   albuterol  108 (90 Base) MCG/ACT inhaler Commonly known as: VENTOLIN  HFA Inhale 2 puffs into the lungs every 6 (six) hours as needed for wheezing or shortness of breath.   amLODipine  10 MG tablet Commonly known as: NORVASC  Take 1 tablet (10 mg total) by mouth daily. What changed: when to take this   atorvastatin  20 MG tablet Commonly known as: LIPITOR Take 20 mg by mouth daily.   Creon  12000-38000 units Cpep capsule Generic drug: lipase/protease/amylase Take 2-4 capsules by mouth See admin instructions. Take 4 capsule by mouth three times a day with meals and take 2 capsule by mouth with snacks   folic acid -vitamin b complex-vitamin c-selenium-zinc  3 MG Tabs tablet Take 1 tablet by mouth daily.   gabapentin  100 MG capsule Commonly known as: NEURONTIN  Take 2 capsules (200 mg total) by mouth at bedtime.   hydrALAZINE  25 MG tablet Commonly known as: APRESOLINE  Take 3 tablets  (75 mg total) by mouth every 8 (eight) hours.   ipratropium-albuterol  0.5-2.5 (3) MG/3ML Soln Commonly known as: DUONEB Take 3 mLs by nebulization 3 (three) times daily. What changed:  when to take this reasons to take this   lidocaine  5 % Commonly known as: LIDODERM  Place 1 patch onto the skin every 12 (twelve) hours as needed for up to 10 days. Remove & Discard patch within 12 hours or as directed by MD   pantoprazole  40 MG tablet Commonly known as: PROTONIX  Take 1 tablet (40 mg total) by mouth daily.   sevelamer  carbonate 800 MG tablet Commonly known as: RENVELA  Take 800 mg by mouth 3 (three) times daily with meals.   Trelegy Ellipta  100-62.5-25 MCG/ACT Aepb Generic drug: Fluticasone-Umeclidin-Vilant Inhale 1 puff into the lungs daily.        Disposition and follow-up:   Ashley [name] was discharged from [name] in Stable condition.  At the hospital follow up visit please address:  Psychosis including hallucinations and paranoia ESRD on HD--ensure pt is attending dialysis Mobility and ADL's--HH should be set up.  FMLA for daughter--she requested FMLA paperwork due to her having to be caretaker.    2.  Labs / imaging needed at time of follow-up: RFP   Follow-up Appointments:  Follow-up Information     [facility] Follow up.   Why: Jamee)- HHPT/OT arranged- they will contact you to schedule likely over the weekend  Contact information: [address] [place] [address] [phone]                 Hospital Course by problem list: Ashley [name] is a 73 y.o. person living with a history of ESRD on HD, COPD, PUD, chronic pancreatitis, and hx of stroke who presented with acute psychosis now being discharged on hospital day 0 with the following pertinent hospital course:  Acute Psychosis The pt was admitted to [facility] on 7/15 after her daughter noticed that she was acting strangely. She described  paranoia, auditory and visual hallucinations. Work up with MRI, RPR, HIV, and TSH were unremarkable. She was seen by nephrology due to ESRD on HD and underwent HD that night. On 7/16 her mental status had significantly improved. She reports still having some hallucinations but they were not as severe. She was evaluated by psychiatry who did not think she needed intervention or medication adjustment at this time. OT/PT were consulted at the request of her daughter and [facility] was recommended for both. On 7/17 she was deemed safe for discharge.    Stable chronic medical conditions: COPD PUD  Subjective Pt seen and examined at the bedside this morning. She was feeling much better and denied hallucinations. She is excited to leave today.   Discharge Exam:   BP (!) 158/71 (BP Location: Right Leg)   Pulse 85   Temp 98.2 F (36.8 C) (Oral)   Resp 18   Ht 5' 2 (1.575 m)   Wt 44.9 kg   SpO2 96%   BMI 18.10 kg/m  Discharge exam:  Const: Awake, alert in NAD HENT: Normocephalic, atraumatic, mucus membranes moist Card: RRR, No MRG, No pitting edema on LE's bilaterally  Resp: LCTAB, no increased work of breathing Abd: Soft, NTND Extremities: Warm, fistula in LUE with palpable thrill Psych: Normal thought process without delusions or paranoia   Pertinent Labs, Studies, and Procedures:     Latest Ref Rng & Units 12/23/2023    4:42 AM 12/22/2023    8:01 AM 12/21/2023   10:21 AM  CBC  WBC 4.0 - 10.5 K/uL 6.7  7.7  6.9   Hemoglobin 12.0 - 15.0 g/dL 89.4  88.7  89.1   Hematocrit 36.0 - 46.0 % 31.3  34.0  34.7   Platelets 150 - 400 K/uL 303  286  296        Latest Ref Rng & Units 12/24/2023    5:07 AM 12/23/2023    4:42 AM 12/22/2023    8:01 AM  CMP  Glucose 70 - 99 mg/dL 890  73  69   BUN 8 - 23 mg/dL 46  31  55   Creatinine 0.44 - 1.00 mg/dL 89.73  2.41  87.72   Sodium 135 - 145 mmol/L 135  135  135   Potassium 3.5 - 5.1 mmol/L 4.1  4.5  6.5   Chloride 98 - 111 mmol/L 92  91  91   CO2 22 -  32 mmol/L 27  26  26    Calcium  8.9 - 10.3 mg/dL 89.9  9.9  89.6   Total Protein 6.5 - 8.1 g/dL   7.5   Total Bilirubin 0.0 - 1.2 mg/dL   1.0   Alkaline Phos 38 - 126 U/L   66   AST 15 - 41 U/L   16   ALT 0 - 44 U/L   8     MR BRAIN WO CONTRAST Result Date: 12/23/2023  CLINICAL DATA:  Mental status change, unknown cause EXAM: MRI HEAD WITHOUT CONTRAST TECHNIQUE: Multiplanar, multiecho pulse sequences of the brain and surrounding structures were obtained without intravenous contrast. COMPARISON:  CT head from earlier today. FINDINGS: Brain: No acute infarction, hemorrhage, hydrocephalus, extra-axial collection or mass lesion. Moderate T2/FLAIR hyperintensities the white matter are compatible with chronic microvascular ischemic change. Cerebral atrophy. Vascular: Major arterial flow voids are maintained at the skull base. Skull and upper cervical spine: Normal marrow signal. Sinuses/Orbits: Clear sinuses.  No acute orbital findings. Other: No mastoid effusions. IMPRESSION: 1. No evidence of acute intracranial abnormality. 2. Moderate chronic microvascular ischemic change. Electronically Signed   By: Gilmore GORMAN Molt M.D.   On: 12/23/2023 01:38   CT Head Wo Contrast Result Date: 12/22/2023 CLINICAL DATA:  Provided history: Mental status change, unknown cause. Additional history provided: Fatigue. EXAM: CT HEAD WITHOUT CONTRAST TECHNIQUE: Contiguous axial images were obtained from the base of the skull through the vertex without intravenous contrast. RADIATION DOSE REDUCTION: This exam was performed according to the departmental dose-optimization program which includes automated exposure control, adjustment of the mA and/or kV according to patient size and/or use of iterative reconstruction technique. COMPARISON:  Brain MRI 06/14/2017. FINDINGS: Brain: Generalized cerebral atrophy. Patchy and ill-defined hypoattenuation within the cerebral white matter, nonspecific but compatible with moderate chronic small  vessel ischemic disease. There is no acute intracranial hemorrhage. No demarcated cortical infarct. No extra-axial fluid collection. No evidence of an intracranial mass. No midline shift. Vascular: No hyperdense vessel.  Atherosclerotic calcifications. Skull: No calvarial fracture or aggressive osseous lesion. Sinuses/Orbits: No mass or acute finding within the imaged orbits. No significant paranasal sinus disease at the imaged levels. Other: Left parotid gland is poorly delineated at the imaged levels, atrophic or surgically absent. IMPRESSION: 1. No evidence of an acute intracranial abnormality. 2. Moderate chronic small vessel ischemic changes within the cerebral white matter. 3. Generalized cerebral atrophy. Electronically Signed   By: Rockey Childs D.O.   On: 12/22/2023 08:26   DG Chest Portable 1 View Result Date: 12/22/2023 CLINICAL DATA:  73 year old female with cough and fatigue. EXAM: PORTABLE CHEST 1 VIEW COMPARISON:  Portable chests yesterday and earlier. FINDINGS: Portable AP semi upright view at 0655 hours. Stable cardiomegaly and mediastinal contours. Calcified aortic atherosclerosis. Visualized tracheal air column is within normal limits. Mildly lower lung volumes. Diffuse increased pulmonary interstitial opacity, symmetric. Patchy increased density at the lung bases more resembling atelectasis. No pneumothorax or pleural effusion. Left axillary vascular stent. No acute osseous abnormality identified. Paucity of bowel gas in the upper abdomen. IMPRESSION: Cardiomegaly with symmetric increased pulmonary interstitial opacity since yesterday suspicious for mild or developing interstitial edema. Mild bibasilar atelectasis. No pleural effusion is evident. Electronically Signed   By: VEAR Hurst M.D.   On: 12/22/2023 07:09     Discharge Instructions: Discharge Instructions     Call MD for:   Complete by: As directed    Increasing confusion, hallucinations, delusions, or paranoia.   Call MD for:   difficulty breathing, headache or visual disturbances   Complete by: As directed    Call MD for:  persistant dizziness or light-headedness   Complete by: As directed    Diet - low sodium heart healthy   Complete by: As directed    Discharge instructions   Complete by: As directed    Thank you for allowing us  to be part of your care. You were hospitalized for acute psychosis. We treated you with dialysis.  You may  take tylenol  650mg  every six hours as needed for shoulder pain.   Please continue your dialysis as regularly scheduled.   FOLLOW UP APPOINTMENTS: We arranged for you to follow up with [facility] 12[facility]2023 at 9:15 with Dr. Waymond Please call the psychiatry office [phone]) to set up a follow up in 7-14 days You should also be contacted to arrange Home Health physical and occupational home health.   Please call your PCP or our clinic if you have any questions or concerns, we may be able to help and keep you from a long and expensive emergency room wait. Our clinic and after hours phone number is [phone]. The best time to call is Monday through Friday 9 am to 4 pm but there is always someone available 24/7 if you have an emergency. If you need medication refills please notify your pharmacy one week in advance and they will send us  a request.   We are glad you are feeling better,  [name], DO Internal Medicine Inpatient Teaching Service at [name]   Increase activity slowly   Complete by: As directed    No wound care   Complete by: As directed        Signed: Novak Schuyler, DO 12/24/2023, 2:33 PM

## 2023-12-24 NOTE — Progress Notes (Signed)
 RN attempt to call patient daughter to inform her of patient being discharged and to set up arrangements for pick up. No answer. Consulting civil engineer and management made aware.

## 2023-12-24 NOTE — Progress Notes (Signed)
 Paged for right shoulder pain.  Evaluated patient and she complained of right shoulder pain.  Patient rated pain 8 out of 10.  Thought to be musculoskeletal in nature.  Ordered lidocaine  patch for her and Tylenol  as needed.

## 2023-12-24 NOTE — Progress Notes (Signed)
 Physical Therapy Treatment Patient Details Name: Ashley [name] MRN: [id number] DOB: 1950/09/23 Today's Date: 12/24/2023   History of Present Illness Pt is a 73 y.o. F who presents 12/22/2023 after a fall and confusion. Admitted for psychotic symptoms. Psychiatry evaluated and recommended outpatient follow up. Xray R shoulder negative for fracture on 7/14. Significant PMH: ESRD on HD MWF, COPD, PUD, chronic pancreatitis, stroke.    PT Comments  Pt is progressing well towards goals. Mod I for bed mobility, supervision for sit to stand, CGA for gait and Min A to CGA for stairs. Pt requires UE support for balance and has supportive family at home. Due to pt current functional status, home set up and available assistance at home recommending skilled physical therapy services 3x/week in order to address strength, balance and functional mobility to decrease risk for falls, injury and re-hospitalization.       If plan is discharge home, recommend the following: A little help with walking and/or transfers;Assistance with cooking/housework;Assist for transportation;Help with stairs or ramp for entrance;Supervision due to cognitive status     Equipment Recommendations  None recommended by PT       Precautions / Restrictions Precautions Precautions: Fall Restrictions Weight Bearing Restrictions Per Provider Order: No     Mobility  Bed Mobility Overal bed mobility: Modified Independent      Transfers Overall transfer level: Needs assistance Equipment used: Rolling walker (2 wheels) Transfers: Sit to/from Stand Sit to Stand: Supervision           General transfer comment: cues for hand placement    Ambulation/Gait Ambulation/Gait assistance: Contact guard assist Gait Distance (Feet): 150 Feet Assistive device: Rolling walker (2 wheels) Gait Pattern/deviations: Step-through pattern, Decreased stride length, Decreased dorsiflexion - right, Decreased dorsiflexion - left Gait velocity:  CGA for safety, decreased bilateral heel strike (hx of peripheral neuropathy), dynamic unsteadiness Gait velocity interpretation: <1.8 ft/sec, indicate of risk for recurrent falls       Stairs Stairs: Yes Stairs assistance: Contact guard assist, Min assist Stair Management: Sideways, Step to pattern, One rail Right Number of Stairs: 10 General stair comments: initially pt attempted to uise both rails R shoulder pain; pt educated on step to gait pattern side ways with one rail on the R and pt improved to CGA from MIn a     Balance Overall balance assessment: Needs assistance Sitting-balance support: Feet supported Sitting balance-Leahy Scale: Good     Standing balance support: Bilateral upper extremity supported, Reliant on assistive device for balance Standing balance-Leahy Scale: Poor      Communication Communication Communication: No apparent difficulties  Cognition Arousal: Alert Behavior During Therapy: WFL for tasks assessed/performed   PT - Cognitive impairments: No apparent impairments       Following commands: Intact      Cueing Cueing Techniques: Verbal cues         Pertinent Vitals/Pain Pain Assessment Pain Assessment: Faces Faces Pain Scale: Hurts whole lot Pain Location: R shoulder with ROM Pain Descriptors / Indicators: Grimacing, Guarding Pain Intervention(s): Monitored during session, Limited activity within patient's tolerance, Repositioned    Home Living Family/patient expects to be discharged to:: Private residence Living Arrangements: Children Available Help at Discharge: Family;Available 24 hours/day Type of Home: House Home Access: Stairs to enter Entrance Stairs-Rails: Right Entrance Stairs-Number of Steps: flight   Home Layout: One level Home Equipment: Educational psychologist (4 wheels);Cane - quad          PT Goals (current goals can now be found in  the care plan section) Acute Rehab PT Goals Patient Stated Goal: go home PT Goal  Formulation: With patient Time For Goal Achievement: 01/06/24 Potential to Achieve Goals: Fair Progress towards PT goals: Progressing toward goals    Frequency    Min 2X/week      PT Plan  Continue with current POC        AM-PAC PT 6 Clicks Mobility   Outcome Measure  Help needed turning from your back to your side while in a flat bed without using bedrails?: None Help needed moving from lying on your back to sitting on the side of a flat bed without using bedrails?: None Help needed moving to and from a bed to a chair (including a wheelchair)?: A Little Help needed standing up from a chair using your arms (e.g., wheelchair or bedside chair)?: A Little Help needed to walk in hospital room?: A Little Help needed climbing 3-5 steps with a railing? : A Little 6 Click Score: 20    End of Session Equipment Utilized During Treatment: Gait belt Activity Tolerance: Patient tolerated treatment well Patient left: in bed;with call bell/phone within reach;with bed alarm set Nurse Communication: Mobility status PT Visit Diagnosis: Unsteadiness on feet (R26.81);History of falling (Z91.81)     Time: 8745-8687 PT Time Calculation (min) (ACUTE ONLY): 18 min  Charges:    $Therapeutic Activity: 8-22 mins PT General Charges $$ ACUTE PT VISIT: 1 Visit                     Dorothyann Maier, DPT, CLT  Acute Rehabilitation Services Office: [phone] (Secure chat preferred)    Dorothyann VEAR Maier 12/24/2023, 1:34 PM

## 2023-12-24 NOTE — TOC Transition Note (Signed)
 Transition of Care (TOC) - Discharge Note Rayfield Gobble RN, BSN Inpatient Care Management Unit 4E- RN Case Manager See Treatment Team for direct phone # [phone] Cross Coverage  Patient Details  Name: Ashley [name] MRN: [id number] Date of Birth: 05-04-1951  Transition of Care [facility]) CM/SW Contact:  Gobble Rayfield Hurst, RN Phone Number: 12/24/2023, 1:34 PM   Clinical Narrative:    Per attending team plan for pt to discharge today. Pt goes to HD- MWF and will resume her outpt schedule tomorrow.   Orders placed for HHPT/OT. Per chart review note that pt has used [facility] in the past.   CM spoke with pt at bedside- discussed HH needs- choice offered for [facility] provider- pt voiced she would like to use [facility] again- they were nice before.  Pt confirmed she has DME at home- no new DME needs noted.   Pt voiced her daughter will be coming to transport home   Final next level of care: Home w Home Health Services Barriers to Discharge: No Barriers Identified   Patient Goals and CMS Choice Patient states their goals for this hospitalization and ongoing recovery are:: return home   Choice offered to / list presented to : Patient      Discharge Placement               Home w/ [facility]        Discharge Plan and Services Additional resources added to the After Visit Summary for     Discharge Planning Services: CM Consult Post Acute Care Choice: Home Health          DME Arranged: N/A DME Agency: NA       HH Arranged: PT, OT HH Agency: [facility] Date [facility] Agency Contacted: 12/24/23 Time HH Agency Contacted: 1333 Representative spoke with at [facility] Agency: Amy  Social Drivers of Health (SDOH) Interventions SDOH Screenings   Food Insecurity: Patient Unable To Answer (12/22/2023)  Housing: Low Risk  (12/22/2023)  Transportation Needs: Patient Unable To Answer (12/22/2023)  Utilities: Not At Risk (06/19/2023)  Depression (PHQ2-9): Low Risk  (04/06/2019)  Recent Concern: Depression  (PHQ2-9) - Medium Risk (04/03/2019)  Social Connections: Unknown (12/22/2023)  Tobacco Use: High Risk (12/22/2023)     Readmission Risk Interventions    03/10/2023    2:46 PM  Readmission Risk Prevention Plan  Transportation Screening Complete  PCP or Specialist Appt within 3-5 Days Complete  HRI or Home Care Consult Complete  Palliative Care Screening Not Applicable  Medication Review (RN Care Manager) Referral to Pharmacy

## 2023-12-28 ENCOUNTER — Telehealth: Payer: Self-pay

## 2023-12-28 NOTE — Telephone Encounter (Signed)
 Patient called and sch an appt for the following:  Name: Ashley, [name] MRN: [id number]  Date: 12/31/2023 Status: Sch  Time: 9:15 AM Length: 30  Visit Type: OPEN ESTABLISHED [726] Copay: $0.00  Provider: Waymond Cart, MD      Copied from CRM [phone]. Topic: General - Other >> Dec 22, 2023 12:06 PM Miquel SAILOR wrote: Reason for CRM: Fe Mcloughlin in [place] Needs Dr. Rosan to go back down stairs to fill out the paperwork ASAP. Called office n/a. Need call back ASAP [phone]

## 2023-12-29 NOTE — Progress Notes (Unsigned)
 Patient name: Ashley [name] Date of birth: [birth year] Date of visit: 01/01/24  Type of visit: Established Patient Office Visit  Subjective   Chief concern:  Chief Complaint  Patient presents with   Hospitalization Follow-up    Ashley [name] is a 73 y.o. female with a PMHx of ESRD on HD, COPD, PUD, chronic pancreatitis, CVA who presents to [facility] clinic for hospital follow up.  Patient would also like to establish care.  Follow up Hospitalization  Patient was admitted to IMTS at [facility] on 12/22/2023 and discharged on 12/24/2023. She was treated for psychosis including hallucinations and paranoia, ESRD on HD. Treatment for this included dialysis, observation. During admission, Psychiatry was consulted for patient's psychosis and concluded that no additional psychiatric medications were necessary.  -----------------------------------------------------------------------------------------  Past Medical History:  Diagnosis Date   Acute pancreatitis 2000   2000, 12/2018, 08/2019   Arthritis    Cervical radiculopathy 02/28/2011   Cocaine substance abuse (HCC) 05/26/2013   positive UDS    COPD (chronic obstructive pulmonary disease) (HCC)    COPD (chronic obstructive pulmonary disease) (HCC) 12/22/2023   Duodenitis    Dyspnea    Erosive gastropathy    ESRD on hemodialysis (HCC)    TTS   GERD (gastroesophageal reflux disease)    Hepatitis C 1987   dt hx IVDA.  genotype 2B.  Epclusa  started early 04/2020.     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  Chronic microvascular ischemic changes w parenchymal volume loss. Chronic white matter periventricular microhemorrhage, likely due to htn   Ulcer 1990   gastric ulcer. Ruptured s/p  emergency repair     Past Surgical History:  Procedure Laterality Date   ABDOMINAL HYSTERECTOMY  1979   AV FISTULA PLACEMENT Left 06/16/2017   Procedure: ARTERIOVENOUS (AV) FISTULA CREATION LEFT ARM;  Surgeon: Laurence Redell CROME, MD;  Location: [facility] OR;  Service: Vascular;  Laterality: Left;   BASCILIC VEIN TRANSPOSITION Left 10/02/2017   Procedure: BASILIC VEIN TRANSPOSITION SECOND STAGE LEFT ARM;  Surgeon: Oris Krystal FALCON, MD;  Location: [facility] OR;  Service: Vascular;  Laterality: Left;   BIOPSY  09/06/2019   Procedure: BIOPSY;  Surgeon: Avram Lupita FORBES, MD;  Location: [facility] ENDOSCOPY;  Service: Endoscopy;;   BIOPSY  03/13/2023   Procedure: BIOPSY;  Surgeon: San Sandor GAILS, DO;  Location: [facility] ENDOSCOPY;  Service: Gastroenterology;;   COLONOSCOPY WITH PROPOFOL  N/A 03/13/2023   Procedure: COLONOSCOPY WITH PROPOFOL ;  Surgeon: San Sandor GAILS, DO;  Location: [facility] ENDOSCOPY;  Service: Gastroenterology;  Laterality: N/A;   ENTEROSCOPY N/A 10/22/2022   Procedure: ENTEROSCOPY;  Surgeon: Legrand Victory CROME DOUGLAS, MD;  Location: [facility] ENDOSCOPY;  Service: Gastroenterology;  Laterality: N/A;   ESOPHAGOGASTRODUODENOSCOPY N/A 05/29/2013   Procedure: ESOPHAGOGASTRODUODENOSCOPY (EGD);  Surgeon: Gordy CHRISTELLA Starch, MD;  Location: [facility] ENDOSCOPY;  Service: Endoscopy;  Laterality: N/A;   ESOPHAGOGASTRODUODENOSCOPY  05/2013   for epigastric pain.  Nonobstructing Schatzki ring at GEJ, mild gastropathy, nonbleeding AVMs in bulb and D2. 5 mm sessile polyp in bulb.   ESOPHAGOGASTRODUODENOSCOPY (EGD) WITH PROPOFOL  N/A 09/06/2019   Procedure: ESOPHAGOGASTRODUODENOSCOPY (EGD) WITH PROPOFOL ;  Surgeon: Avram Lupita FORBES, MD;  Location: [facility] ENDOSCOPY;  Service: Endoscopy;  Laterality: N/A;   ESOPHAGOGASTRODUODENOSCOPY (EGD)  WITH PROPOFOL  N/A 02/02/2020   Procedure: ESOPHAGOGASTRODUODENOSCOPY (EGD) WITH PROPOFOL ;  Surgeon: Teressa Toribio SQUIBB, MD;  Location: [facility] ENDOSCOPY;  Service: Endoscopy;  Laterality: N/A;   ESOPHAGOGASTRODUODENOSCOPY (EGD) WITH  PROPOFOL  N/A 02/07/2021   Procedure: ESOPHAGOGASTRODUODENOSCOPY (EGD) WITH PROPOFOL ;  Surgeon: Teressa Toribio SQUIBB, MD;  Location: [facility] ENDOSCOPY;  Service: Endoscopy;  Laterality: N/A;   ESOPHAGOGASTRODUODENOSCOPY (EGD) WITH PROPOFOL  N/A 03/13/2023   Procedure: ESOPHAGOGASTRODUODENOSCOPY (EGD) WITH PROPOFOL ;  Surgeon: San Sandor GAILS, DO;  Location: [facility] ENDOSCOPY;  Service: Gastroenterology;  Laterality: N/A;   EUS N/A 02/02/2020   Procedure: UPPER ENDOSCOPIC ULTRASOUND (EUS) RADIAL;  Surgeon: Teressa Toribio SQUIBB, MD;  Location: [facility] ENDOSCOPY;  Service: Endoscopy;  Laterality: N/A;   EUS N/A 08/20/2023   Procedure: UPPER ENDOSCOPIC ULTRASOUND (EUS) RADIAL;  Surgeon: Wilhelmenia Aloha Raddle., MD;  Location: [facility] ENDOSCOPY;  Service: Gastroenterology;  Laterality: N/A;   EXCHANGE OF A DIALYSIS CATHETER Left 07/31/2017   Procedure: Removal  OF A  Right GroinTUNNELED  DIALYSIS CATHETER ,  Insertion of Left Femoral Dialysis Catheter.;  Surgeon: Oris Krystal FALCON, MD;  Location: [facility] OR;  Service: Vascular;  Laterality: Left;   FISTULOGRAM Left 11/19/2023   Procedure: FISTULOGRAM WITH ANGIOPLASTY OF FISTULA;  Surgeon: Magda Debby SAILOR, MD;  Location: [facility] OR;  Service: Vascular;  Laterality: Left;   HEMOSTASIS CLIP PLACEMENT  02/07/2021   Procedure: HEMOSTASIS CLIP PLACEMENT;  Surgeon: Teressa Toribio SQUIBB, MD;  Location: [facility] ENDOSCOPY;  Service: Endoscopy;;   HEMOSTASIS CLIP PLACEMENT  10/22/2022   Procedure: HEMOSTASIS CLIP PLACEMENT;  Surgeon: Legrand Victory LITTIE DOUGLAS, MD;  Location: [facility] ENDOSCOPY;  Service: Gastroenterology;;   HEMOSTASIS CLIP PLACEMENT  03/13/2023   Procedure: HEMOSTASIS CLIP PLACEMENT;  Surgeon: San Sandor GAILS, DO;  Location: [facility] ENDOSCOPY;  Service: Gastroenterology;;   HEMOSTASIS CLIP PLACEMENT  08/20/2023   Procedure: CONTROL OF HEMORRHAGE, GI TRACT, ENDOSCOPIC, BY CLIPPING OR OVERSEWING;  Surgeon: Wilhelmenia Aloha Raddle., MD;  Location: [facility] ENDOSCOPY;  Service: Gastroenterology;;   HOT HEMOSTASIS N/A  09/06/2019   Procedure: HOT HEMOSTASIS (ARGON PLASMA COAGULATION/BICAP);  Surgeon: Avram Lupita BRAVO, MD;  Location: [facility] ENDOSCOPY;  Service: Endoscopy;  Laterality: N/A;   HOT HEMOSTASIS N/A 02/07/2021   Procedure: HOT HEMOSTASIS (ARGON PLASMA COAGULATION/BICAP);  Surgeon: Teressa Toribio SQUIBB, MD;  Location: [facility] ENDOSCOPY;  Service: Endoscopy;  Laterality: N/A;   HOT HEMOSTASIS N/A 10/22/2022   Procedure: HOT HEMOSTASIS (ARGON PLASMA COAGULATION/BICAP);  Surgeon: Legrand Victory LITTIE DOUGLAS, MD;  Location: [facility] ENDOSCOPY;  Service: Gastroenterology;  Laterality: N/A;   HOT HEMOSTASIS N/A 08/20/2023   Procedure: EGD, WITH ARGON PLASMA COAGULATION;  Surgeon: Wilhelmenia Aloha Raddle., MD;  Location: [facility] ENDOSCOPY;  Service: Gastroenterology;  Laterality: N/A;   INSERTION OF DIALYSIS CATHETER Right 06/16/2017   Procedure: INSERTION OF DIALYSIS CATHETER;  Surgeon: Laurence Redell LITTIE, MD;  Location: [facility] OR;  Service: Vascular;  Laterality: Right;   IR AV DIALY SHUNT INTRO NEEDLE/INTRACATH INITIAL W/PTA/IMG LEFT  06/21/2018   POLYPECTOMY  03/13/2023   Procedure: POLYPECTOMY;  Surgeon: San Sandor GAILS, DO;  Location: [facility] ENDOSCOPY;  Service: Gastroenterology;;   POLYPECTOMY  08/20/2023   Procedure: POLYPECTOMY;  Surgeon: Wilhelmenia Aloha Raddle., MD;  Location: [facility] ENDOSCOPY;  Service: Gastroenterology;;   REPAIR OF PERFORATED ULCER  1990   gastric ulcer   SCLEROTHERAPY  10/22/2022   Procedure: MATIAS;  Surgeon: Legrand Victory LITTIE DOUGLAS, MD;  Location: [facility] ENDOSCOPY;  Service: Gastroenterology;;   SUBMUCOSAL TATTOO INJECTION  10/22/2022   Procedure: SUBMUCOSAL TATTOO INJECTION;  Surgeon: Legrand Victory LITTIE DOUGLAS, MD;  Location: [facility] ENDOSCOPY;  Service: Gastroenterology;;    Review of Systems  Constitutional:  Negative for chills and fever.  Respiratory:  Negative for cough, shortness of breath and wheezing.   Cardiovascular:  Negative for chest pain and leg swelling.  Gastrointestinal:  Negative for abdominal pain.   Musculoskeletal:  Positive for joint pain (right shoulder pain).  Psychiatric/Behavioral:  Negative for depression, hallucinations and suicidal ideas.     Current Outpatient Medications  Medication Instructions   acetaminophen  (TYLENOL ) 650 mg, Oral, Every 6 hours PRN   albuterol  (VENTOLIN  HFA) 108 (90 Base) MCG/ACT inhaler 2 puffs, Inhalation, Every 6 hours PRN   amLODipine  (NORVASC ) 10 mg, Oral, Daily   atorvastatin  (LIPITOR) 20 mg, Oral, Daily   CREON  12000-38000 units CPEP capsule 2-4 capsules, Oral, See admin instructions, Take 4 capsule by mouth three times a day with meals and take 2 capsule by mouth with snacks    diclofenac  Sodium (VOLTAREN ) 4 g, Topical, 4 times daily   Fluticasone-Umeclidin-Vilant (TRELEGY ELLIPTA ) 100-62.5-25 MCG/ACT AEPB 1 puff, Inhalation, Daily   folic acid -vitamin b complex-vitamin c-selenium-zinc  (DIALYVITE ) 3 MG TABS tablet 1 tablet, Oral, Daily   gabapentin  (NEURONTIN ) 200 mg, Oral, Daily at bedtime   hydrALAZINE  (APRESOLINE ) 75 mg, Oral, Every 8 hours   ipratropium-albuterol  (DUONEB) 0.5-2.5 (3) MG/3ML SOLN 3 mLs, Nebulization, 3 times daily   lidocaine  (LIDODERM ) 5 % 1 patch, Transdermal, Every 12 hours PRN, Remove & Discard patch within 12 hours or as directed by MD   pantoprazole  (PROTONIX ) 40 mg, Oral, Daily   sevelamer  carbonate (RENVELA ) 800 mg, Oral, 3 times daily with meals    Social History   Tobacco Use   Smoking status: Every Day    Current packs/day: 0.25    Average packs/day: 0.3 packs/day for 40.0 years (10.0 ttl pk-yrs)    Types: Cigarettes    Passive exposure: Current   Smokeless tobacco: Never   Tobacco comments:    Smoking 4 per day, trying to quit.  Patches do not help.  She would like something prescribed to help.  11/20/2023 hfb  Vaping Use   Vaping status: Never Used  Substance Use Topics   Alcohol use: No    Alcohol/week: 0.0 standard drinks of alcohol   Drug use: Not Currently    Types: Heroin, Marijuana, Cocaine     Comment: Has not used cocaine in 15 years. Smokes marijuana occasionally      Objective  Today's Vitals   12/31/23 0923 12/31/23 1020  BP: (!) 160/69 (!) 159/74  Pulse: 81 77  Temp: 97.9 F (36.6 C)   TempSrc: Oral   SpO2: 100%   Weight: 98 lb 6.4 oz (44.6 kg)   Height: 5' 2 (1.575 m)   Body mass index is 18 kg/m.   Physical Exam: Constitutional: well-appearing, well-nourished; underweight; no acute distress HENT: normocephalic atraumatic, mucous membranes moist Eyes: conjunctiva non-erythematous Cardiovascular: regular rate and rhythm, harsh holosystolic murmur heard throughout Pulmonary/Chest: normal work of breathing on room air, lungs clear to auscultation bilaterally Abdominal: soft, non-tender, non-distended MSK: normal bulk and tone; limited active ROM of right shoulder and elbow secondary to pain.  ROM limited with internal rotation and only increased with encouragement.  External rotation limited to 90.  Extension was severely limited less than 90.  She was unable to move her elbow in a posterior fashion towards her trunk. Neurological: alert & oriented x 3, no focal deficit Skin: warm and dry Extremities: BLE without edema or erythema. LUE fistula with overlying  ecchymoses and palpable thrill. Psych: normal mood and behavior; patient is not responding to internal cues  Last CBC Lab Results  Component Value Date   WBC 6.7 12/23/2023   HGB 10.5 (L) 12/23/2023   HCT 31.3 (L) 12/23/2023   MCV 89.9 12/23/2023   MCH 30.2 12/23/2023   RDW 15.4 12/23/2023   PLT 303 12/23/2023   Last metabolic panel Lab Results  Component Value Date   GLUCOSE 74 12/31/2023   NA 139 12/31/2023   K 4.9 12/31/2023   CL 93 (L) 12/31/2023   CO2 22 12/31/2023   BUN 33 (H) 12/31/2023   CREATININE 8.55 (H) 12/31/2023   GFRNONAA 4 (L) 12/24/2023   CALCIUM  10.7 (H) 12/31/2023   PHOS 4.3 12/31/2023   PROT 7.5 12/22/2023   ALBUMIN  4.7 12/31/2023   LABGLOB 3.1 04/02/2018   AGRATIO 1.4  04/02/2018   BILITOT 1.0 12/22/2023   ALKPHOS 66 12/22/2023   AST 16 12/22/2023   ALT 8 12/22/2023   ANIONGAP 16 (H) 12/24/2023   Last lipids Lab Results  Component Value Date   CHOL 195 12/31/2023   HDL 100 12/31/2023   LDLCALC 78 12/31/2023   TRIG 99 12/31/2023   CHOLHDL 2.0 12/31/2023   Last hemoglobin A1c Lab Results  Component Value Date   HGBA1C 5.1 06/14/2017   Last thyroid  functions Lab Results  Component Value Date   TSH 5.531 (H) 12/22/2023   Last vitamin D No results found for: 25OHVITD2, 25OHVITD3, VD25OH Last vitamin B12 and Folate Lab Results  Component Value Date   VITAMINB12 781 12/22/2023        Assessment & Plan  Psychosis in elderly [facility]) Assessment & Plan: Recently admitted for paranoia and auditory + visual hallucinations. MRI and CT head negative for acute process, only showing chronic microvascular ischemic changes. RPR, TSH, HIV testing were unremarkable. Improved after hemodialysis. Psychiatry was consulted and deemed there was no active management required; observation. Today during office visit patient denies any auditory or visual hallucinations. On exam the patient is not showing any signs of responding to internal cues. Appropriate thought content. Denies SI/HI/depressed mood. - Encouraged patient to follow up with Psychiatry; they already have the phone number to schedule appointment.   ESRD on dialysis [facility]) Assessment & Plan: Patient is currently on MWF dialysis schedule. States that she goes to [facility] for dialysis and missed her Monday session. Patient prescribed Renvela  800 mg 3 times a day for hyperphosphatemia. She states she takes it with food and only 2x per day.  Patient states that she does not have a nephrologist but Renvela  prescribed by Dr. Gearline with [facility]. - Renal function panel, pending - Continue Renvela   Orders: -     Renal function panel  Chronic obstructive pulmonary disease, unspecified COPD  type (HCC) Assessment & Plan: Patient has history of COPD managed with albuterol  inhaler as needed, Trelegy maintenance inhaler with DuoNeb.  Patient states that she has only been taking inhaler/nebulizer treatments as needed instead of daily.  Patient follows with pulmonology (Dr. Darlean).  She has follow up appointment on 01/14/2024.  She will also be getting updated PFTs.  On exam, the patient is in no acute distress and does not have increased work of breathing. Lungs clear to auscultation bilaterally.  Will defer management to pulmonology. - Continue albuterol  inhaler as needed - Continue daily maintenance Trelegy and DuoNeb   Primary hypertension Assessment & Plan: Patient's blood pressure in office today is 160/69, but the patient states she  has not taken any of her medications.  Current regimen includes amlodipine  10 mg daily and hydralazine  25 mg 3 times daily.  Counseled the patient on checking blood pressure at home and bring the readings to next visit. -Continue amlodipine  and hydralazine    Peptic ulcer disease Assessment & Plan: - Refilled patient's pantoprazole   Orders: -     Pantoprazole  Sodium; Take 1 tablet (40 mg total) by mouth daily.  Dispense: 30 tablet; Refill: 2  Mixed hyperlipidemia Assessment & Plan: Patient has history of hyperlipidemia and currently takes Lipitor 20 mg daily.  Last lipid panel was several years ago and the patient is unsure if [facility] had been following up with her cholesterol. - Updated lipid panel, pending - Continue Lipitor 20 mg daily  Orders: -     Lipid panel  Acute pain of right shoulder due to trauma Assessment & Plan: Patient states that she fell on her right shoulder at home 2 weeks ago and is endorsing pain and limited mobility.  2 x-rays were completed of her shoulder which were negative for fracture or dislocation.  On exam today the patient's range of motion is limited due to pain.  Upon discharge from the hospital, she was given  home health exercises that she can complete but she has not been doing those. - Home health PT referral placed; continue home health range of motion exercises in the meantime - Voltaren  gel 4 times a day as needed - Encouraged heating pad as needed  Orders: -     Diclofenac  Sodium; Apply 4 g topically 4 (four) times daily.  Dispense: 120 g; Refill: 3  At high risk for falls Assessment & Plan: Patient feels unsteady on her feet and has fallen multiple times at home.  She has neuropathy/numbness of her lower extremities which contributes to her fall risk.  At home the patient has a rollator to assist with walking.  In office today, the patient is with her daughter who has helped her walk to the appointment.  The patient lives on the second floor and has to go down stairs which is less than ideal given the patient's fall risk.  Upon gait analysis the patient is unable to walk unassisted and is unsteady.  I think she would benefit from physical therapy to reduce risk of falls.  Patient's daughter would like to order DME electric scooter for the patient, but this can be addressed at following visits. - Home health referral for PT  Orders: -     Ambulatory referral to Home Health  Requires assistance with activities of daily living (ADL) Assessment & Plan: The patient currently lives with her daughter who is her main caregiver.  However, the patient's daughter works from home and has to step away from work to care for her mother.  Patient's daughter requesting FMLA paperwork which I explained could be filled out at the next appointment.  Patient's daughter states that the patient at baseline requires help with certain ADLs including bathing and dressing.  Patient's daughter requesting home health assistance as she has had to take time off work to help the patient.   Neuropathy involving both lower extremities Assessment & Plan: Patient has chronic history of peripheral neuropathy of unknown  etiology.  Previously prescribed gabapentin  300 mg nightly, but the patient states that was too high of a dose for her so she is currently taking 200 mg nightly.  She says the gabapentin  makes her feet feel numb. - Continue gabapentin  - Emphasized  checking feet daily for lesions or injuries    The patient was previously with [facility] as her PCP.  Daughter states that she wants to establish care with [facility] because she was unhappy with scheduling and appointments with [facility].  We have requested records to be sent to [facility].   Return in about 4 weeks (around 01/28/2024) for follow up chronic conditions/paperwork for FMLA.   Patient discussed with Dr. Jeanelle, who also saw and evaluated the patient.  Lyda Colcord, MD [facility]  PGY-1 01/01/2024, 11:15 AM

## 2023-12-30 ENCOUNTER — Other Ambulatory Visit: Payer: Self-pay | Admitting: Student

## 2023-12-30 NOTE — Assessment & Plan Note (Addendum)
 Patient is currently on MWF/TThS dialysis ____ -Renal function panel, pending

## 2023-12-30 NOTE — Assessment & Plan Note (Addendum)
 Recently admitted for paranoia and auditory + visual hallucinations. MRI and CT head negative for acute process, only showing chronic microvascular ischemic changes. RPR, TSH, HIV testing were unremarkable. Improved after hemodialysis. Psychiatry was consulted and deemed there was no active management required; observation. Today during office visit patient denies any auditory or visual hallucinations. On exam the patient is not showing any signs of responding to internal cues. Appropriate thought content. Denies SI/HI/depressed mood. - Encouraged patient to follow up with Psychiatry; they already have the phone number to schedule appointment.

## 2023-12-30 NOTE — Telephone Encounter (Unsigned)
 Copied from CRM [id number]. Topic: Clinical - Medication Refill >> Dec 30, 2023  3:34 PM Miquel SAILOR wrote: Medication: lidocaine  (LIDODERM ) 5 % [[id number]]  Has the patient contacted their pharmacy? Yes (Agent: If no, request that the patient contact the pharmacy for the refill. If patient does not wish to contact the pharmacy document the reason why and proceed with request.) (Agent: If yes, when and what did the pharmacy advise?)  This is the patient's preferred pharmacy:  [facility] [address], [place] - [address] [place] [address] Phone: [phone] Fax: [phone]  [facility]  - [place], [place] - [address] [place] [address] Phone: [phone] Fax: [phone]  Is this the correct pharmacy for this prescription? Yes If no, delete pharmacy and type the correct one.   Has the prescription been filled recently? Yes  Is the patient out of the medication? No never received due to Prior Auth needs refill   Has the patient been seen for an appointment in the last year OR does the patient have an upcoming appointment? Yes  Can we respond through MyChart? Yes  Agent: Please be advised that Rx refills may take up to 3 business days. We ask that you follow-up with your pharmacy. >> Dec 30, 2023  3:42 PM Miquel SAILOR wrote: [id number] - [place], [place] - [address]    [address]    [place] [address]    Phone: [phone] Fax: [phone]  Location for pharmacy   Also daughter Elgie is asking due to medication kind of price if there samples they can have.

## 2023-12-30 NOTE — Telephone Encounter (Signed)
 Called pt to verify correct pharmacy do CRM including two: spoke with patient's daughter - she informed the correct pharmacy as This is the patient's preferred pharmacy:  [id number] - [place], [place] - [address] [place] [address] Phone: [phone] Fax: [phone]  Patient's daughter also stated she was the person that called in earlier about the rx: stated the original rx went to pharmacy however pharmacy stated that prior authorization is needed and not another rx.    Copied from CRM [id number]. Topic: Clinical - Medication Refill >> Dec 30, 2023  3:34 PM Miquel SAILOR wrote: Medication: lidocaine  (LIDODERM ) 5 % [[id number]]   Has the patient contacted their pharmacy? Yes (Agent: If no, request that the patient contact the pharmacy for the refill. If patient does not wish to contact the pharmacy document the reason why and proceed with request.) (Agent: If yes, when and what did the pharmacy advise?)   This is the patient's preferred pharmacy:  [facility] [address] [place] - [address] [place] [address] Phone: [phone] Fax: [phone]   [facility]  - [place], [place] - [address] [place] [address] Phone: [phone] Fax: [phone]   Is this the correct pharmacy for this prescription? Yes If no, delete pharmacy and type the correct one.    Has the prescription been filled recently? Yes   Is the patient out of the medication? No never received due to Prior Auth needs refill    Has the patient been seen for an appointment in the last year OR does the patient have an upcoming appointment? Yes   Can we respond through MyChart? Yes   Agent: Please be advised that Rx refills may take up to 3 business days. We ask that you follow-up with your pharmacy.

## 2023-12-30 NOTE — Telephone Encounter (Unsigned)
 Copied from CRM [id number]. Topic: Clinical - Medication Refill >> Dec 30, 2023  3:34 PM Miquel SAILOR wrote: Medication: lidocaine  (LIDODERM ) 5 % [[id number]]  Has the patient contacted their pharmacy? Yes (Agent: If no, request that the patient contact the pharmacy for the refill. If patient does not wish to contact the pharmacy document the reason why and proceed with request.) (Agent: If yes, when and what did the pharmacy advise?)  This is the patient's preferred pharmacy:  [facility] [address], [place] - [address] [place] [address] Phone: [phone] Fax: [phone]  [facility]  - [place], [place] - [address] [place] [address] Phone: [phone] Fax: [phone]  Is this the correct pharmacy for this prescription? Yes If no, delete pharmacy and type the correct one.   Has the prescription been filled recently? Yes  Is the patient out of the medication? No never received due to Prior Auth needs refill   Has the patient been seen for an appointment in the last year OR does the patient have an upcoming appointment? Yes  Can we respond through MyChart? Yes  Agent: Please be advised that Rx refills may take up to 3 business days. We ask that you follow-up with your pharmacy.

## 2023-12-31 ENCOUNTER — Telehealth: Payer: Self-pay

## 2023-12-31 ENCOUNTER — Ambulatory Visit

## 2023-12-31 VITALS — BP 159/74 | HR 77 | Temp 97.9°F | Ht 62.0 in | Wt 98.4 lb

## 2023-12-31 DIAGNOSIS — I120 Hypertensive chronic kidney disease with stage 5 chronic kidney disease or end stage renal disease: Secondary | ICD-10-CM

## 2023-12-31 DIAGNOSIS — Z992 Dependence on renal dialysis: Secondary | ICD-10-CM

## 2023-12-31 DIAGNOSIS — M25511 Pain in right shoulder: Secondary | ICD-10-CM

## 2023-12-31 DIAGNOSIS — K279 Peptic ulcer, site unspecified, unspecified as acute or chronic, without hemorrhage or perforation: Secondary | ICD-10-CM

## 2023-12-31 DIAGNOSIS — G5793 Unspecified mononeuropathy of bilateral lower limbs: Secondary | ICD-10-CM

## 2023-12-31 DIAGNOSIS — E782 Mixed hyperlipidemia: Secondary | ICD-10-CM

## 2023-12-31 DIAGNOSIS — Z741 Need for assistance with personal care: Secondary | ICD-10-CM

## 2023-12-31 DIAGNOSIS — Z9181 History of falling: Secondary | ICD-10-CM

## 2023-12-31 DIAGNOSIS — N186 End stage renal disease: Secondary | ICD-10-CM

## 2023-12-31 DIAGNOSIS — F0390 Unspecified dementia without behavioral disturbance: Secondary | ICD-10-CM

## 2023-12-31 DIAGNOSIS — J449 Chronic obstructive pulmonary disease, unspecified: Secondary | ICD-10-CM | POA: Diagnosis not present

## 2023-12-31 DIAGNOSIS — I10 Essential (primary) hypertension: Secondary | ICD-10-CM

## 2023-12-31 DIAGNOSIS — G8911 Acute pain due to trauma: Secondary | ICD-10-CM | POA: Insufficient documentation

## 2023-12-31 DIAGNOSIS — G8929 Other chronic pain: Secondary | ICD-10-CM | POA: Insufficient documentation

## 2023-12-31 MED ORDER — LIDOCAINE 5 % EX PTCH: 1.0000 | MEDICATED_PATCH | Freq: Two times a day (BID) | CUTANEOUS | 0 refills | Status: AC | PRN

## 2023-12-31 MED ORDER — DICLOFENAC SODIUM 1 % EX GEL
4.0000 g | Freq: Four times a day (QID) | CUTANEOUS | 3 refills | Status: DC
Start: 2023-12-31 — End: 2024-01-12

## 2023-12-31 MED ORDER — PANTOPRAZOLE SODIUM 40 MG PO TBEC: 40.0000 mg | DELAYED_RELEASE_TABLET | Freq: Every day | ORAL | 2 refills | Status: DC

## 2023-12-31 NOTE — Assessment & Plan Note (Addendum)
 Patient feels unsteady on her feet and has fallen multiple times at home.  She has neuropathy/numbness of her lower extremities which contributes to her fall risk.  At home the patient has a rollator to assist with walking.  In office today, the patient is with her daughter who has helped her walk to the appointment.  The patient lives on the second floor and has to go down stairs which is less than ideal given the patient's fall risk.  Upon gait analysis the patient is unable to walk unassisted and is unsteady.  I think she would benefit from physical therapy to reduce risk of falls.  Patient's daughter would like to order DME electric scooter for the patient, but this can be addressed at following visits. - Home health referral for PT

## 2023-12-31 NOTE — Telephone Encounter (Signed)
 Prior Authorization for patient (Lidoderm  5% patches) came through on cover my meds was submitted with last office notes awaiting approval or denial.  KEY:[id number]

## 2023-12-31 NOTE — Assessment & Plan Note (Signed)
 Patient has history of hyperlipidemia and currently takes Lipitor 20 mg daily.  Last lipid panel was several years ago and the patient is unsure if [facility] had been following up with her cholesterol. - Updated lipid panel, pending - Continue Lipitor 20 mg daily

## 2023-12-31 NOTE — Assessment & Plan Note (Signed)
Refilled patient's pantoprazole.

## 2023-12-31 NOTE — Assessment & Plan Note (Addendum)
 Patient states that she fell on her right shoulder at home 2 weeks ago and is endorsing pain and limited mobility.  2 x-rays were completed of her shoulder which were negative for fracture or dislocation.  On exam today the patient's range of motion is limited due to pain.  Upon discharge from the hospital, she was given home health exercises that she can complete but she has not been doing those. - Home health PT referral placed; continue home health range of motion exercises in the meantime - Voltaren  gel 4 times a day as needed - Encouraged heating pad as needed

## 2023-12-31 NOTE — Assessment & Plan Note (Signed)
 Patient has history of COPD managed with albuterol  inhaler as needed, Trelegy maintenance inhaler with DuoNeb.  Patient states that she has only been taking inhaler/nebulizer treatments as needed instead of daily.  Patient follows with pulmonology (Dr. Darlean).  She has follow up appointment on 01/14/2024.  She will also be getting updated PFTs.  On exam, the patient is in no acute distress and does not have increased work of breathing. Lungs clear to auscultation bilaterally.  Will defer management to pulmonology. - Continue albuterol  inhaler as needed -Continue daily maintenance Trelegy and DuoNeb

## 2023-12-31 NOTE — Telephone Encounter (Signed)
 Looks like she established today with visit with Dr Waymond

## 2023-12-31 NOTE — Telephone Encounter (Signed)
 Yes I can initiate a PA, I will need a ICD code.

## 2023-12-31 NOTE — Assessment & Plan Note (Signed)
 Patient's blood pressure in office today is 160/69, but the patient states she has not taken any of her medications.  Current regimen includes amlodipine  10 mg daily and hydralazine  25 mg 3 times daily.  Counseled the patient on checking blood pressure at home and bring the readings to next visit. -Continue amlodipine  and hydralazine 

## 2023-12-31 NOTE — Assessment & Plan Note (Signed)
 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  Previously prescribed gabapentin  300 mg nightly, but the patient states that was too high of a dose for her so she is currently taking 200 mg nightly.  She says the gabapentin  makes her feet feel numb. - Continue gabapentin  - Emphasized checking feet daily for lesions or injuries

## 2023-12-31 NOTE — Patient Instructions (Addendum)
 Thank you, [name] for allowing us  to provide your care today. Today we discussed the following:  - Please call Psychiatry at [phone] to follow up for her appointment for the hallucinations she experienced before her hospital stay. - For right shoulder pain - try Voltaren  gel up to four times daily. Please continue moving your shoulder and try the exercises that you were provided with. Also you can try heating pad. We will place a referral for home health physical therapy. - We are checking a blood test for your kidney function and cholesterol. I will call you with the results. - I have sent a refill of pantoprazole  to your pharmacy. - Please bring all of your medications to your next visit. - Please check your blood pressure at home after taking your blood pressure medications. Bring the readings with you to the next visit.  I have ordered the following labs for you:  Lab Orders         Renal function panel         Lipid panel      Tests ordered today:    Referrals ordered today:   Referral Orders         Ambulatory referral to Home Health       I have ordered the following medication/changed the following medications:   Stop the following medications: Medications Discontinued During This Encounter  Medication Reason   pantoprazole  (PROTONIX ) 40 MG tablet Reorder     Start the following medications: Meds ordered this encounter  Medications   pantoprazole  (PROTONIX ) 40 MG tablet    Sig: Take 1 tablet (40 mg total) by mouth daily.    Dispense:  30 tablet    Refill:  2   diclofenac  Sodium (VOLTAREN ) 1 % GEL    Sig: Apply 4 g topically 4 (four) times daily.    Dispense:  120 g    Refill:  3     Follow up: 1 month to follow up with chronic conditions    Remember: Please call the psychiatry office at [phone] to schedule an appointment to discuss hallucinations/paranoia she experienced before her admission to the hospital.  Should you have any questions  or concerns please call the Internal Medicine Clinic at [phone].     Ashley Walrond, MD [facility]

## 2023-12-31 NOTE — Assessment & Plan Note (Addendum)
 The patient currently lives with her daughter who is her main caregiver.  However, the patient's daughter works from home and has to step away from work to care for her mother.  Patient's daughter requesting FMLA paperwork which I explained could be filled out at the next appointment.  Patient's daughter states that the patient at baseline requires help with certain ADLs including bathing and dressing.  Patient's daughter requesting home health assistance as she has had to take time off work to help the patient.

## 2024-01-01 ENCOUNTER — Ambulatory Visit: Payer: Self-pay

## 2024-01-01 LAB — RENAL FUNCTION PANEL
Albumin: 4.7 g/dL (ref 3.8–4.8)
BUN/Creatinine Ratio: 4 — ABNORMAL LOW (ref 12–28)
BUN: 33 mg/dL — ABNORMAL HIGH (ref 8–27)
CO2: 22 mmol/L (ref 20–29)
Calcium: 10.7 mg/dL — ABNORMAL HIGH (ref 8.7–10.3)
Chloride: 93 mmol/L — ABNORMAL LOW (ref 96–106)
Creatinine, Ser: 8.55 mg/dL — ABNORMAL HIGH (ref 0.57–1.00)
Glucose: 74 mg/dL (ref 70–99)
Phosphorus: 4.3 mg/dL (ref 3.0–4.3)
Potassium: 4.9 mmol/L (ref 3.5–5.2)
Sodium: 139 mmol/L (ref 134–144)
eGFR: 4 mL/min/1.73 — ABNORMAL LOW (ref >59)

## 2024-01-01 LAB — LIPID PANEL
Chol/HDL Ratio: 2 ratio (ref 0.0–4.4)
Cholesterol, Total: 195 mg/dL (ref 100–199)
HDL: 100 mg/dL (ref >39)
LDL Chol Calc (NIH): 78 mg/dL (ref 0–99)
Triglycerides: 99 mg/dL (ref 0–149)
VLDL Cholesterol Cal: 17 mg/dL (ref 5–40)

## 2024-01-01 MED ORDER — ATORVASTATIN CALCIUM 40 MG PO TABS: 40.0000 mg | ORAL_TABLET | Freq: Every day | ORAL | 11 refills | Status: AC

## 2024-01-01 NOTE — Addendum Note (Signed)
 Addended by: RENNE HOMANS on: 01/01/2024 11:29 AM   Modules accepted: Orders

## 2024-01-01 NOTE — Telephone Encounter (Signed)
 Patient Name: Ashley [name] Patient DOB: 05-Nov-1950 Patient ID: [id number] Status of Request: Deny Medication Name: Lidoderm  Dis 5% Patch GPI/NDC: [id number] Decision Notes: LIDODERM  DIS 5% PATCH is not covered. The requested medication with the submitted Generic Product Identifier (GPI)/National Drug Code [facility]) is not properly listed with the Food and Drug Administration (FDA) and does not meet regulatory requirements that would constitute a Part D-eligible drug. Therefore, the medication is not covered under your Part D prescription drug plan

## 2024-01-02 NOTE — Progress Notes (Signed)
 Internal Medicine Clinic Attending  I was physically present during the key portions of the resident provided service and participated in the medical decision making of patient's management care. I reviewed pertinent patient test results.  The assessment, diagnosis, and plan were formulated together and I agree with the documentation in the resident's note.  Carney Living, MD

## 2024-01-04 ENCOUNTER — Telehealth: Payer: Self-pay | Admitting: *Deleted

## 2024-01-04 NOTE — Telephone Encounter (Unsigned)
 RTC to [place], PT [facility].  Requesting Verbal order for PT 1 time a week for 5 weeks.  1 time a week every other week for 4 weeks.  For Strengthening of legs, Gait Training and Balance. Verbal approval given will forward to PCP for agreement or denial.  Also requesting Verbal Order for OT evaluation.  Copied from CRM [id number]. Topic: Clinical - Home Health Verbal Orders >> Jan 04, 2024  1:50 PM Mercer PEDLAR wrote: Caller/Agency: [facility]  Callback Number: [phone] Service Requested: Physical Therapy, And Occupational Therapy. Frequency: 1 week 5, 1 every other week for 4 weeks.  Any new concerns about the patient? Yes [place] is requesting evaluation for OT for right shoulder pain. Patient is having difficulty swallowing with big foods like steak. Patient refused speech therapy.

## 2024-01-05 ENCOUNTER — Other Ambulatory Visit: Payer: Self-pay

## 2024-01-05 ENCOUNTER — Encounter (HOSPITAL_COMMUNITY): Payer: Self-pay

## 2024-01-05 ENCOUNTER — Emergency Department (HOSPITAL_COMMUNITY)
Admission: EM | Admit: 2024-01-05 | Discharge: 2024-01-05 | Disposition: A | Discharge status: Home / Self Care | Attending: Student | Admitting: Student

## 2024-01-05 ENCOUNTER — Emergency Department (HOSPITAL_COMMUNITY)

## 2024-01-05 DIAGNOSIS — I120 Hypertensive chronic kidney disease with stage 5 chronic kidney disease or end stage renal disease: Secondary | ICD-10-CM | POA: Diagnosis not present

## 2024-01-05 DIAGNOSIS — F17210 Nicotine dependence, cigarettes, uncomplicated: Secondary | ICD-10-CM | POA: Insufficient documentation

## 2024-01-05 DIAGNOSIS — N186 End stage renal disease: Secondary | ICD-10-CM | POA: Diagnosis not present

## 2024-01-05 DIAGNOSIS — K529 Noninfective gastroenteritis and colitis, unspecified: Secondary | ICD-10-CM | POA: Diagnosis not present

## 2024-01-05 DIAGNOSIS — Z8673 Personal history of transient ischemic attack (TIA), and cerebral infarction without residual deficits: Secondary | ICD-10-CM | POA: Diagnosis not present

## 2024-01-05 DIAGNOSIS — J449 Chronic obstructive pulmonary disease, unspecified: Secondary | ICD-10-CM | POA: Diagnosis not present

## 2024-01-05 DIAGNOSIS — Z992 Dependence on renal dialysis: Secondary | ICD-10-CM | POA: Insufficient documentation

## 2024-01-05 DIAGNOSIS — R109 Unspecified abdominal pain: Secondary | ICD-10-CM | POA: Diagnosis present

## 2024-01-05 DIAGNOSIS — Z79899 Other long term (current) drug therapy: Secondary | ICD-10-CM | POA: Insufficient documentation

## 2024-01-05 DIAGNOSIS — R112 Nausea with vomiting, unspecified: Secondary | ICD-10-CM

## 2024-01-05 LAB — COMPREHENSIVE METABOLIC PANEL WITH GFR
ALT: 11 U/L (ref 0–44)
AST: 24 U/L (ref 15–41)
Albumin: 4.1 g/dL (ref 3.5–5.0)
Alkaline Phosphatase: 72 U/L (ref 38–126)
Anion gap: 16 — ABNORMAL HIGH (ref 5–15)
BUN: 26 mg/dL — ABNORMAL HIGH (ref 8–23)
CO2: 26 mmol/L (ref 22–32)
Calcium: 10.3 mg/dL (ref 8.9–10.3)
Chloride: 91 mmol/L — ABNORMAL LOW (ref 98–111)
Creatinine, Ser: 7.66 mg/dL — ABNORMAL HIGH (ref 0.44–1.00)
GFR, Estimated: 5 mL/min — ABNORMAL LOW (ref >60)
Glucose, Bld: 132 mg/dL — ABNORMAL HIGH (ref 70–99)
Potassium: 5.3 mmol/L — ABNORMAL HIGH (ref 3.5–5.1)
Sodium: 133 mmol/L — ABNORMAL LOW (ref 135–145)
Total Bilirubin: 0.6 mg/dL (ref 0.0–1.2)
Total Protein: 8.5 g/dL — ABNORMAL HIGH (ref 6.5–8.1)

## 2024-01-05 LAB — CBC WITH DIFFERENTIAL/PLATELET
Abs Immature Granulocytes: 0.05 K/uL (ref 0.00–0.07)
Basophils Absolute: 0.1 K/uL (ref 0.0–0.1)
Basophils Relative: 1 %
Eosinophils Absolute: 0 K/uL (ref 0.0–0.5)
Eosinophils Relative: 0 %
HCT: 35.8 % — ABNORMAL LOW (ref 36.0–46.0)
Hemoglobin: 11.5 g/dL — ABNORMAL LOW (ref 12.0–15.0)
Immature Granulocytes: 1 %
Lymphocytes Relative: 7 %
Lymphs Abs: 0.7 K/uL (ref 0.7–4.0)
MCH: 29.3 pg (ref 26.0–34.0)
MCHC: 32.1 g/dL (ref 30.0–36.0)
MCV: 91.1 fL (ref 80.0–100.0)
Monocytes Absolute: 0.3 K/uL (ref 0.1–1.0)
Monocytes Relative: 3 %
Neutro Abs: 8.9 K/uL — ABNORMAL HIGH (ref 1.7–7.7)
Neutrophils Relative %: 88 %
Platelets: 480 K/uL — ABNORMAL HIGH (ref 150–400)
RBC: 3.93 MIL/uL (ref 3.87–5.11)
RDW: 18.3 % — ABNORMAL HIGH (ref 11.5–15.5)
WBC: 10 K/uL (ref 4.0–10.5)
nRBC: 0 % (ref 0.0–0.2)

## 2024-01-05 LAB — LIPASE, BLOOD: Lipase: 45 U/L (ref 11–51)

## 2024-01-05 MED ORDER — DICYCLOMINE HCL 20 MG PO TABS: 20.0000 mg | ORAL_TABLET | Freq: Two times a day (BID) | ORAL | 0 refills | Status: AC

## 2024-01-05 MED ORDER — PROCHLORPERAZINE EDISYLATE 10 MG/2ML IJ SOLN
10.0000 mg | Freq: Once | INTRAMUSCULAR | Status: AC
  Administered 2024-01-05: 10 mg via INTRAVENOUS
  Filled 2024-01-05: qty 2

## 2024-01-05 MED ORDER — HYDROCODONE-ACETAMINOPHEN 5-325 MG PO TABS
1.0000 | ORAL_TABLET | Freq: Once | ORAL | Status: AC
  Administered 2024-01-05: 1 via ORAL
  Filled 2024-01-05: qty 1

## 2024-01-05 MED ORDER — LACTATED RINGERS IV BOLUS
1000.0000 mL | Freq: Once | INTRAVENOUS | Status: AC
  Administered 2024-01-05: 1000 mL via INTRAVENOUS

## 2024-01-05 MED ORDER — DIPHENHYDRAMINE HCL 50 MG/ML IJ SOLN
25.0000 mg | Freq: Once | INTRAMUSCULAR | Status: AC
  Administered 2024-01-05: 25 mg via INTRAVENOUS
  Filled 2024-01-05: qty 1

## 2024-01-05 MED ORDER — ONDANSETRON 4 MG PO TBDP: 4.0000 mg | ORAL_TABLET | Freq: Three times a day (TID) | ORAL | 0 refills | Status: DC | PRN

## 2024-01-05 MED ORDER — ONDANSETRON 4 MG PO TBDP
4.0000 mg | ORAL_TABLET | Freq: Once | ORAL | Status: AC
  Administered 2024-01-05: 4 mg via ORAL
  Filled 2024-01-05: qty 1

## 2024-01-05 MED ORDER — IOHEXOL 350 MG/ML SOLN
75.0000 mL | Freq: Once | INTRAVENOUS | Status: AC | PRN
  Administered 2024-01-05: 75 mL via INTRAVENOUS

## 2024-01-05 NOTE — ED Triage Notes (Signed)
 C/O n/v/d that started this morning. Last full tx of dialysis was yesterday. Denies SHOB and CP. C/O lower abd pain.

## 2024-01-05 NOTE — ED Notes (Signed)
 Provider to triage to place US  IV. Pt tolerated without issues.

## 2024-01-05 NOTE — ED Notes (Signed)
 Pt not in room.

## 2024-01-05 NOTE — ED Provider Notes (Signed)
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   gastric ulcer. Ruptured s/p emergency repair      Dispostion: I considered admission for this patient, but at this time she does not meet inpatient criteria for admission and will be discharged with outpatient follow-up     Final Clinical Impression(s) / ED Diagnoses Final diagnoses:  Gastroenteritis  Nausea vomiting and diarrhea     @PCDICTATION @    Albertina Dixon, MD 01/05/24 2333

## 2024-01-05 NOTE — ED Provider Triage Note (Signed)
 Emergency Medicine Provider Triage Evaluation Note  Ashley [name] , a 73 y.o. female  was evaluated in triage.  Pt complains of vomiting, diarrhea, and abdominal pain starting this morning.  Review of Systems  Positive: Vomiting, diarrhea, abdominal pain Negative: Chest pain  Physical Exam  BP (!) 158/70 (BP Location: Right Arm)   Pulse 76   Temp 99.3 F (37.4 C) (Oral)   Resp 16   Ht 5' 2 (1.575 m)   Wt 44.5 kg   SpO2 99%   BMI 17.92 kg/m  Gen:   Awake, alert Resp:  Normal effort Abd:  Mild generalized tenderness, soft abdomen  Medical Decision Making  Medically screening exam initiated at 2:03 PM.  Appropriate orders placed.  SENAI RAMNATH was informed that the remainder of the evaluation will be completed by another provider, this initial triage assessment does not replace that evaluation, and the importance of remaining in the ED until their evaluation is complete.  Labs, EKG, CT ordered. Will order norco and zofran .   Freddi Hamilton, MD 01/05/24 [phone]

## 2024-01-05 NOTE — ED Notes (Signed)
 CT notified that patient has an IV.

## 2024-01-07 ENCOUNTER — Ambulatory Visit: Payer: Self-pay

## 2024-01-07 ENCOUNTER — Telehealth: Payer: Self-pay

## 2024-01-07 NOTE — Telephone Encounter (Signed)
 FYI Only or Action Required?: Action required by provider: request for appointment.  Patient was last seen in primary care on 12/31/2023 by Waymond Cart, MD.  Called Nurse Triage reporting Fatigue.  Symptoms began several days ago.  Interventions attempted: Nothing.  Symptoms are: gradually worsening.Pt. Seen in ED 01/05/24. Daughter states pt. Is still weak, not eating or going to dialysis. No availability. Requests to be worked in, please advise daughter.  Triage Disposition: See Physician Within 24 Hours  Patient/caregiver understands and will follow disposition?: Yes   Copied from CRM [id number]. Topic: Clinical - Red Word Triage >> Jan 07, 2024  2:12 PM Suzette B wrote: [facility] that prompted transfer to Nurse Triage: weak, not eating, refusing dialysis, lethargic, therapist had went to visit and out of concern stated that she needed to speak with someone. Reason for Disposition  [1] MODERATE weakness (e.g., interferes with work, school, normal activities) AND [2] persists > 3 days  Answer Assessment - Initial Assessment Questions 1. DESCRIPTION: Describe how you are feeling.     weak 2. SEVERITY: How bad is it?  Can you stand and walk?     Moderate 3. ONSET: When did these symptoms begin? (e.g., hours, days, weeks, months)     This week 4. CAUSE: What do you think is causing the weakness or fatigue? (e.g., not drinking enough fluids, medical problem, trouble sleeping)     unsure 5. NEW MEDICINES:  Have you started on any new medicines recently? (e.g., opioid pain medicines, benzodiazepines, muscle relaxants, antidepressants, antihistamines, neuroleptics, beta blockers)     N/a 6. OTHER SYMPTOMS: Do you have any other symptoms? (e.g., chest pain, fever, cough, SOB, vomiting, diarrhea, bleeding, other areas of pain)     no 7. PREGNANCY: Is there any chance you are pregnant? When was your last menstrual period?     no  Protocols used: Weakness (Generalized) and  Fatigue-A-AH

## 2024-01-07 NOTE — Telephone Encounter (Signed)
 Copied from CRM [id number]. Topic: General - Other >> Jan 07, 2024  2:19 PM Suzette B wrote: Reason for CRM: Patient's daughter Ms. Elgie Maxcy [phone] has called in regards to some fmla paperwork she had dropped off on yesterday when she brought her mother to see the provider, she stated she dropped off the paperwork however it didn't have a fax number on there. Ms. Maxcy provided the fax number for it to be sent over it is [phone]

## 2024-01-07 NOTE — Telephone Encounter (Signed)
 Dear Dr  Your patient has requested the following form be completed : FMLA  for pt's daughter. Form will be in the yt box under miscellaneous.   Are you able to complete the form outside of an office appointment or do you need the pt sch for an appt?   Please respond within two business days.  [facility] Front Information systems manager

## 2024-01-08 ENCOUNTER — Encounter (HOSPITAL_COMMUNITY): Payer: Self-pay

## 2024-01-08 ENCOUNTER — Other Ambulatory Visit: Payer: Self-pay

## 2024-01-08 ENCOUNTER — Emergency Department (HOSPITAL_COMMUNITY)

## 2024-01-08 ENCOUNTER — Emergency Department (HOSPITAL_COMMUNITY): Admission: EM | Admit: 2024-01-08 | Discharge: 2024-01-09 | Disposition: A | Discharge status: Home / Self Care

## 2024-01-08 DIAGNOSIS — E875 Hyperkalemia: Secondary | ICD-10-CM | POA: Insufficient documentation

## 2024-01-08 DIAGNOSIS — R1010 Upper abdominal pain, unspecified: Secondary | ICD-10-CM | POA: Insufficient documentation

## 2024-01-08 DIAGNOSIS — Z992 Dependence on renal dialysis: Secondary | ICD-10-CM | POA: Insufficient documentation

## 2024-01-08 DIAGNOSIS — J449 Chronic obstructive pulmonary disease, unspecified: Secondary | ICD-10-CM | POA: Insufficient documentation

## 2024-01-08 DIAGNOSIS — N186 End stage renal disease: Secondary | ICD-10-CM | POA: Diagnosis not present

## 2024-01-08 LAB — COMPREHENSIVE METABOLIC PANEL WITH GFR
ALT: 8 U/L (ref 0–44)
AST: 14 U/L — ABNORMAL LOW (ref 15–41)
Albumin: 3.7 g/dL (ref 3.5–5.0)
Alkaline Phosphatase: 61 U/L (ref 38–126)
Anion gap: 18 — ABNORMAL HIGH (ref 5–15)
BUN: 69 mg/dL — ABNORMAL HIGH (ref 8–23)
CO2: 21 mmol/L — ABNORMAL LOW (ref 22–32)
Calcium: 9.7 mg/dL (ref 8.9–10.3)
Chloride: 95 mmol/L — ABNORMAL LOW (ref 98–111)
Creatinine, Ser: 13.23 mg/dL — ABNORMAL HIGH (ref 0.44–1.00)
GFR, Estimated: 3 mL/min — ABNORMAL LOW (ref >60)
Glucose, Bld: 95 mg/dL (ref 70–99)
Potassium: 6.2 mmol/L — ABNORMAL HIGH (ref 3.5–5.1)
Sodium: 134 mmol/L — ABNORMAL LOW (ref 135–145)
Total Bilirubin: 0.7 mg/dL (ref 0.0–1.2)
Total Protein: 7.2 g/dL (ref 6.5–8.1)

## 2024-01-08 LAB — CBC WITH DIFFERENTIAL/PLATELET
Abs Immature Granulocytes: 0.07 K/uL (ref 0.00–0.07)
Basophils Absolute: 0 K/uL (ref 0.0–0.1)
Basophils Relative: 0 %
Eosinophils Absolute: 0 K/uL (ref 0.0–0.5)
Eosinophils Relative: 0 %
HCT: 30.3 % — ABNORMAL LOW (ref 36.0–46.0)
Hemoglobin: 9.7 g/dL — ABNORMAL LOW (ref 12.0–15.0)
Immature Granulocytes: 1 %
Lymphocytes Relative: 9 %
Lymphs Abs: 1.2 K/uL (ref 0.7–4.0)
MCH: 29.6 pg (ref 26.0–34.0)
MCHC: 32 g/dL (ref 30.0–36.0)
MCV: 92.4 fL (ref 80.0–100.0)
Monocytes Absolute: 1 K/uL (ref 0.1–1.0)
Monocytes Relative: 8 %
Neutro Abs: 10.8 K/uL — ABNORMAL HIGH (ref 1.7–7.7)
Neutrophils Relative %: 82 %
Platelets: 399 K/uL (ref 150–400)
RBC: 3.28 MIL/uL — ABNORMAL LOW (ref 3.87–5.11)
RDW: 20.4 % — ABNORMAL HIGH (ref 11.5–15.5)
WBC: 13 K/uL — ABNORMAL HIGH (ref 4.0–10.5)
nRBC: 0.2 % (ref 0.0–0.2)

## 2024-01-08 LAB — LIPASE, BLOOD: Lipase: 32 U/L (ref 11–51)

## 2024-01-08 MED ORDER — LIDOCAINE VISCOUS HCL 2 % MT SOLN
15.0000 mL | Freq: Once | OROMUCOSAL | Status: AC
  Administered 2024-01-08: 15 mL via ORAL
  Filled 2024-01-08: qty 15

## 2024-01-08 MED ORDER — ALUM & MAG HYDROXIDE-SIMETH 200-200-20 MG/5ML PO SUSP
30.0000 mL | Freq: Once | ORAL | Status: AC
  Administered 2024-01-08: 30 mL via ORAL
  Filled 2024-01-08: qty 30

## 2024-01-08 MED ORDER — CHLORHEXIDINE GLUCONATE CLOTH 2 % EX PADS: 6.0000 | MEDICATED_PAD | Freq: Every day | CUTANEOUS | Status: DC

## 2024-01-08 MED ORDER — PANTOPRAZOLE SODIUM 40 MG IV SOLR
40.0000 mg | Freq: Once | INTRAVENOUS | Status: AC
  Administered 2024-01-08: 40 mg via INTRAVENOUS
  Filled 2024-01-08: qty 10

## 2024-01-08 NOTE — ED Notes (Signed)
 Pt daughter called for a update.

## 2024-01-08 NOTE — ED Notes (Signed)
 Assuming care of pt at this time. Report from Krista, [place]

## 2024-01-08 NOTE — ED Triage Notes (Signed)
 [facility] reports pt coming from home. Today pt c/o weakness and abdominal pain. Pt is dialysis but has not been since last Monday. Here about a week ago for same.

## 2024-01-08 NOTE — Telephone Encounter (Signed)
 I called and spoke to the patients daughter she stated the patient is having some weakness, she is not eating and she has missed her dialysis appointments. Patient was scheduled to go to dialysis this morning, she did not go to the appointment. Patient has been scheduled to come in on Monday to see Dr.Ingold.

## 2024-01-08 NOTE — Progress Notes (Signed)
 Received patient in bed to unit.  Alert and oriented.  Informed consent signed and in chart. TX duration: 2.5 hrs   Patient tolerated well.  Transported back to the room Alert, without acute distress.  Hand-off given to patient's nurse.   Access used: AVF  Access issues: none  Total UF removed:  Medication(s) given: none  Post HD VS: see table  Post HD weight: UTA     01/08/24 2321  Vitals  Temp 98.8 F (37.1 C)  Temp Source Oral  BP (!) 184/90  MAP (mmHg) 116  BP Location Right Arm  BP Method Automatic  Patient Position (if appropriate) Lying  Pulse Rate 82  Pulse Rate Source Monitor  ECG Heart Rate 82  Resp 13  Oxygen Therapy  SpO2 100 %  O2 Device Nasal Cannula  O2 Flow Rate (L/min) 3 L/min  Pulse Oximetry Type Continuous  During Treatment Monitoring  Blood Flow Rate (mL/min) 0 mL/min  Arterial Pressure (mmHg) -0.2 mmHg  Venous Pressure (mmHg) -0.8 mmHg  TMP (mmHg) -53.13 mmHg  Ultrafiltration Rate (mL/min) 948 mL/min  Dialysate Flow Rate (mL/min) 300 ml/min  Dialysate Potassium Concentration 2  Dialysate Calcium  Concentration 2.5  Duration of HD Treatment -hour(s) 2.5 hour(s)  Cumulative Fluid Removed (mL) per Treatment  2000.13  HD Safety Checks Performed Yes  Intra-Hemodialysis Comments Tx completed;Tolerated well  Post Treatment  Dialyzer Clearance Lightly streaked  Liters Processed 60  Fluid Removed (mL) 2000 mL  Tolerated HD Treatment Yes  Post-Hemodialysis Comments ALert and oriented x 4, HD completed and tolerated  AVG/AVF Arterial Site Held (minutes) 5 minutes  AVG/AVF Venous Site Held (minutes) 5 minutes  Note  Patient Observations ALert and oriented x 4, HD completed and tolerated  Fistula / Graft Left Upper arm Arteriovenous fistula  No Placement Date or Time found.   Placed prior to admission: Yes  Orientation: Left  Access Location: Upper arm  Access Type: Arteriovenous fistula  Site Condition No complications  Fistula / Graft  Assessment Present;Thrill;Bruit;Aneurysm present  Status Deaccessed  Drainage Description None     [place] CHARLENA Montgomery, BSN, RN [facility]

## 2024-01-08 NOTE — Progress Notes (Signed)
 Oncall Nephro updated on the latest SBP of 180's-190's. No further orders made.

## 2024-01-08 NOTE — ED Provider Notes (Signed)
 Physical Exam  BP (!) 179/74 (BP Location: Right Arm)   Pulse 72   Temp 97.8 F (36.6 C) (Oral)   Resp 12   SpO2 93%   Physical Exam  Procedures  Procedures  ED Course / MDM   Clinical Course as of 01/08/24 1531  Fri Jan 08, 2024  1317 Per chart review, patient with hx of upper endoscopy in March 2025 as well as twice in October 2024 followed by December 2024. Prior GI notes reference the patient experiencing chronic pain daily, per patient's daughter.   I have reviewed her CT from 3 days ago which is generally reassuring. Plan to repeat labs for trending and address pain pending lab results. [KH]    Clinical Course User Index [KH] Keith Sor, PA-C   Medical Decision Making Amount and/or Complexity of Data Reviewed Labs: ordered. Radiology: ordered.  Risk OTC drugs. Prescription drug management.   Upper abd pain x 2-3 weeks H/o chronic pancreatitis EGD's w/o bleeding, ulceration Here for pain CT 3 days ago = gastritis, vomiting Bentyl  w/o relief Pending KUB, labs Vomiting is better  Results for orders placed or performed during the hospital encounter of 01/08/24  CBC with Differential   Collection Time: 01/08/24  1:27 PM  Result Value Ref Range   WBC 13.0 (H) 4.0 - 10.5 K/uL   RBC 3.28 (L) 3.87 - 5.11 MIL/uL   Hemoglobin 9.7 (L) 12.0 - 15.0 g/dL   HCT 69.6 (L) 63.9 - 53.9 %   MCV 92.4 80.0 - 100.0 fL   MCH 29.6 26.0 - 34.0 pg   MCHC 32.0 30.0 - 36.0 g/dL   RDW 79.5 (H) 88.4 - 84.4 %   Platelets 399 150 - 400 K/uL   nRBC 0.2 0.0 - 0.2 %   Neutrophils Relative % 82 %   Neutro Abs 10.8 (H) 1.7 - 7.7 K/uL   Lymphocytes Relative 9 %   Lymphs Abs 1.2 0.7 - 4.0 K/uL   Monocytes Relative 8 %   Monocytes Absolute 1.0 0.1 - 1.0 K/uL   Eosinophils Relative 0 %   Eosinophils Absolute 0.0 0.0 - 0.5 K/uL   Basophils Relative 0 %   Basophils Absolute 0.0 0.0 - 0.1 K/uL   Immature Granulocytes 1 %   Abs Immature Granulocytes 0.07 0.00 - 0.07 K/uL   Comprehensive metabolic panel with GFR   Collection Time: 01/08/24  2:45 PM  Result Value Ref Range   Sodium 134 (L) 135 - 145 mmol/L   Potassium 6.2 (H) 3.5 - 5.1 mmol/L   Chloride 95 (L) 98 - 111 mmol/L   CO2 21 (L) 22 - 32 mmol/L   Glucose, Bld 95 70 - 99 mg/dL   BUN 69 (H) 8 - 23 mg/dL   Creatinine, Ser 86.76 (H) 0.44 - 1.00 mg/dL   Calcium  9.7 8.9 - 10.3 mg/dL   Total Protein 7.2 6.5 - 8.1 g/dL   Albumin  3.7 3.5 - 5.0 g/dL   AST 14 (L) 15 - 41 U/L   ALT 8 0 - 44 U/L   Alkaline Phosphatase 61 38 - 126 U/L   Total Bilirubin 0.7 0.0 - 1.2 mg/dL   GFR, Estimated 3 (L) >60 mL/min   Anion gap 18 (H) 5 - 15  Lipase, blood   Collection Time: 01/08/24  2:45 PM  Result Value Ref Range   Lipase 32 11 - 51 U/L   On recheck, the patient is feeling much better. Pain is resolved. No nausea. VSS.  Patient's labs reflect her having missing several dialysis sessions this week. Discussed with nephrology who will dialyze her tonight in the hospital. She will return to the ED after treatment for discharge home.         Odell Balls, PA-C 01/09/24 0007    Franklyn Sid SAILOR, MD 01/09/24 [phone]

## 2024-01-08 NOTE — ED Notes (Signed)
 Patient transported to X-ray

## 2024-01-08 NOTE — ED Provider Notes (Signed)
 [place] EMERGENCY DEPARTMENT AT [facility] Provider Note   CSN: [id number] Arrival date & time: 01/08/24  1247     Patient presents with: Weakness   Ashley [name] is a 73 y.o. female.   73 y/o female with PMH ESRD on hemodialysis M/W/F, COPD, PUD, chronic pancreatitis, previous CVA presents to the ED for upper abdominal pain. States she has not been feeling well for 2-3 weeks. These symptoms remain ongoing. Last seen 3 days ago in the ED with CT suggesting gastroenteritis. Was prescribed Bentyl  and Zofran , but these medications are not helping her pain. Her N/V has resolved. States that associated diarrhea is improving. However, the pain continues to wax and wane throughout the day and wakes her from sleep at night. Last dialysis was on Monday; didn't have enough energy to go today or this was Wednesday. Denies fever, chest pain, leg swelling.  The history is provided by the patient. No language interpreter was used.  Weakness      Prior to Admission medications   Medication Sig Start Date End Date Taking? Authorizing Provider  acetaminophen  (TYLENOL ) 325 MG tablet Take 2 tablets (650 mg total) by mouth every 6 (six) hours as needed for moderate pain (pain score 4-6). 12/24/23   Elnora Ip, MD  albuterol  (VENTOLIN  HFA) 108 (90 Base) MCG/ACT inhaler Inhale 2 puffs into the lungs every 6 (six) hours as needed for wheezing or shortness of breath. 06/20/23   Jillian Buttery, MD  amLODipine  (NORVASC ) 10 MG tablet Take 1 tablet (10 mg total) by mouth daily. Patient taking differently: Take 10 mg by mouth at bedtime. 11/11/16   Phelps, Jazma Y, DO  atorvastatin  (LIPITOR) 40 MG tablet Take 1 tablet (40 mg total) by mouth daily. 01/01/24 12/31/24  Renne Homans, MD  CREON  12000-38000 units CPEP capsule Take 2-4 capsules by mouth See admin instructions. Take 4 capsule by mouth three times a day with meals and take 2 capsule by mouth with snacks 03/02/23   [provider]  diclofenac  Sodium (VOLTAREN ) 1 % GEL Apply 4 g topically 4 (four) times daily. 12/31/23   Tawkaliyar, Roya, DO  dicyclomine  (BENTYL ) 20 MG tablet Take 1 tablet (20 mg total) by mouth 2 (two) times daily. 01/05/24   Kommor, Madison, MD  Fluticasone-Umeclidin-Vilant (TRELEGY ELLIPTA ) 100-62.5-25 MCG/ACT AEPB Inhale 1 puff into the lungs daily. 10/20/23   Wert, Michael B, MD  folic acid -vitamin b complex-vitamin c-selenium-zinc  (DIALYVITE ) 3 MG TABS tablet Take 1 tablet by mouth daily.    [provider]  gabapentin  (NEURONTIN ) 100 MG capsule Take 2 capsules (200 mg total) by mouth at bedtime. 06/20/23   Jillian Buttery, MD  hydrALAZINE  (APRESOLINE ) 25 MG tablet Take 3 tablets (75 mg total) by mouth every 8 (eight) hours. 11/14/20 03/09/24  [place], Camellia PARAS, DO  ipratropium-albuterol  (DUONEB) 0.5-2.5 (3) MG/3ML SOLN Take 3 mLs by nebulization 3 (three) times daily. Patient taking differently: Take 3 mLs by nebulization every 6 (six) hours as needed (For shortness of breath). 06/20/23   Jillian Buttery, MD  lidocaine  (LIDODERM ) 5 % Place 1 patch onto the skin every 12 (twelve) hours as needed for up to 10 days. Remove & Discard patch within 12 hours or as directed by MD 12/31/23 01/10/24  Rosan Dayton BROCKS, DO  ondansetron  (ZOFRAN -ODT) 4 MG disintegrating tablet Take 1 tablet (4 mg total) by mouth every 8 (eight) hours as needed for nausea or vomiting. 01/05/24   Kommor, Madison, MD  pantoprazole  (PROTONIX ) 40 MG tablet  Take 1 tablet (40 mg total) by mouth daily. 12/31/23 03/30/24  Tawkaliyar, Roya, DO  sevelamer  carbonate (RENVELA ) 800 MG tablet Take 800 mg by mouth 3 (three) times daily with meals. 07/23/20   [provider]    Allergies: Aspirin  and Ibuprofen    Review of Systems  Neurological:  Positive for weakness.  Ten systems reviewed and are negative for acute change, except as noted in the HPI.    Updated Vital Signs BP (!) 179/74 (BP Location: Right Arm)   Pulse 72   Temp 97.8 F  (36.6 C) (Oral)   Resp 12   SpO2 93%   Physical Exam Vitals and nursing note reviewed.  Constitutional:      General: She is not in acute distress.    Appearance: She is well-developed. She is not diaphoretic.     Comments: Nontoxic appearing and in NAD  HENT:     Head: Normocephalic and atraumatic.  Eyes:     General: No scleral icterus.    Conjunctiva/sclera: Conjunctivae normal.  Cardiovascular:     Rate and Rhythm: Normal rate and regular rhythm.     Pulses: Normal pulses.  Pulmonary:     Effort: Pulmonary effort is normal. No respiratory distress.     Breath sounds: No wheezing.     Comments: Respirations even and unlabored Abdominal:     General: There is no distension.     Palpations: Abdomen is soft. There is no mass.     Tenderness: There is abdominal tenderness.     Comments: TTP in the upper abdomen with voluntary guarding. No palpable masses or peritoneal signs.  Musculoskeletal:        General: Normal range of motion.     Cervical back: Normal range of motion.     Comments: Palpable thrill to left upper extremity fistula  Skin:    General: Skin is warm and dry.     Coloration: Skin is not pale.     Findings: No erythema or rash.  Neurological:     Mental Status: She is alert and oriented to person, place, and time.     Coordination: Coordination normal.  Psychiatric:        Behavior: Behavior normal.     (all labs ordered are listed, but only abnormal results are displayed) Labs Reviewed  CBC WITH DIFFERENTIAL/PLATELET - Abnormal; Notable for the following components:      Result Value   WBC 13.0 (*)    RBC 3.28 (*)    Hemoglobin 9.7 (*)    HCT 30.3 (*)    RDW 20.4 (*)    Neutro Abs 10.8 (*)    All other components within normal limits  COMPREHENSIVE METABOLIC PANEL WITH GFR  LIPASE, BLOOD    EKG: None  Radiology: No results found.   Procedures   Medications Ordered in the ED  pantoprazole  (PROTONIX ) injection 40 mg (has no  administration in time range)  alum & mag hydroxide-simeth (MAALOX/MYLANTA) 200-200-20 MG/5ML suspension 30 mL (30 mLs Oral Given 01/08/24 1329)    And  lidocaine  (XYLOCAINE ) 2 % viscous mouth solution 15 mL (15 mLs Oral Given 01/08/24 1329)    Clinical Course as of 01/08/24 1507  Fri Jan 08, 2024  1317 Per chart review, patient with hx of upper endoscopy in March 2025 as well as twice in October 2024 followed by December 2024. Prior GI notes reference the patient experiencing chronic pain daily, per patient's daughter.   I have reviewed her CT  from 3 days ago which is generally reassuring. Plan to repeat labs for trending and address pain pending lab results. [KH]    Clinical Course User Index [KH] Keith Sor, PA-C                                 Medical Decision Making Amount and/or Complexity of Data Reviewed Labs: ordered.  Risk OTC drugs. Prescription drug management.   This patient presents to the ED for concern of abdominal pain, this involves an extensive number of treatment options, and is a complaint that carries with it a high risk of complications and morbidity.  The differential diagnosis includes gastritis vs gastroenteritis vs pancreatitis vs biliary colic vs ruptured viscous.   Co morbidities that complicate the patient evaluation  ESRD COPD Chronic pancreatitis CVA   Additional history obtained:  External records from outside source obtained and reviewed including findings of mild antral gastritis on EGD 03/2023. EGD and EUS from March 2025 c/w erosive, nonbleeding gastropathy as well as changes c/w moderate/severe chronic pancreatitis. Normal CBD pathology.   Lab Tests:  I Ordered, and personally interpreted labs.  The pertinent results include:  WBC 13 (was 10.0 on 01/05/24; nonspecific change). Remaining labs pending.   Cardiac Monitoring:  The patient was maintained on a cardiac monitor.  I personally viewed and interpreted the cardiac monitored  which showed an underlying rhythm of: NSR   Medicines ordered and prescription drug management:  I ordered medication including Protonix  and GI cocktail for pain.  I have reviewed the patients home medicines and have made adjustments as needed   Test Considered:  CT abdomen/pelvis - felt low yield given symptom chronicity and recurrence, completion of imaging 3 days prior.   Problem List / ED Course:  As above   Reevaluation:  After the interventions noted above, I reevaluated the patient and found that they have :stayed the same   Social Determinants of Health:  ESRD patient   Dispostion:  Patient signed out to [place], PA-C at shift change pending labs and imaging.      Final diagnoses:  Upper abdominal pain    ED Discharge Orders     None          Keith Sor, PA-C 01/08/24 1515    Simon Lavonia SAILOR, MD 01/09/24 [phone]

## 2024-01-08 NOTE — Progress Notes (Signed)
 Brief nephrology note  Patient known to our group. Dialyzed at [facility] missed this week due to abdominal pain. K elevated. Will do short HD session today then plan for DC and resume outpatient HD Monday. Encourage compliance.

## 2024-01-08 NOTE — ED Notes (Signed)
 Pt provided with food and drink.

## 2024-01-08 NOTE — ED Notes (Signed)
 Returned daughter's call. All questions were answered. Daughter states she just want to get to the bottom of why mom's hasn't been feeling good lately. States she's been here for 3x this past week with the same complaints. States pt went to Dialysis on Monday and since then she's been sick.

## 2024-01-09 NOTE — ED Provider Notes (Signed)
  Physical Exam  BP (!) 158/71   Pulse 82   Temp 98.7 F (37.1 C)   Resp 16   SpO2 97%   Physical Exam  Procedures  Procedures  ED Course / MDM   Clinical Course as of 01/09/24 0100  Fri Jan 08, 2024  1317 Per chart review, patient with hx of upper endoscopy in March 2025 as well as twice in October 2024 followed by December 2024. Prior GI notes reference the patient experiencing chronic pain daily, per patient's daughter.   I have reviewed her CT from 3 days ago which is generally reassuring. Plan to repeat labs for trending and address pain pending lab results. [KH]    Clinical Course User Index [KH] Keith Sor, PA-C   Medical Decision Making Amount and/or Complexity of Data Reviewed Labs: ordered. Radiology: ordered.  Risk OTC drugs. Prescription drug management.   Patient seen in the ED initially for fatigue, found to have electrolyte abnormalities consistent with missing multiple dialysis sessions.  Was dialyzed this evening and is returned to the ER for reevaluation.  She is clinically well-appearing with reassuring vital signs and feels well, ready to be discharged home.  Medical concern for emergent underlying condition that would warrant further ED workup or patient management is exceedingly low.  Ashley [name] voiced understanding of her medical evaluation and treatment plan. Each of their questions answered to their expressed satisfaction.  Return precautions were given.  Patient is well-appearing, stable, and was discharged in good condition. This chart was dictated using voice recognition software, Dragon. Despite the best efforts of this provider to proofread and correct errors, errors may still occur which can change documentation meaning.        Ashley Catano R, PA-C 01/09/24 0102    Carita Senior, MD 01/09/24 1018

## 2024-01-09 NOTE — ED Notes (Signed)
 Pt back from MRI. Denies needs.

## 2024-01-09 NOTE — Discharge Instructions (Signed)
 Please follow up with your primary care provider if you have any further abdominal pain or new symptoms. Please go to your scheduled dialysis appointments as scheduled next Monday.

## 2024-01-10 NOTE — Progress Notes (Deleted)
 Ashley [name], female    DOB: 1950/12/21   MRN: [id number]   Brief patient profile:  25 yobf active smoker  referred to pulmonary clinic 10/20/2023 by Ashley Sharps NP  for copd eval       On HD since 2020   Pt not previously seen by PCCM service.    History of Present Illness  10/20/2023  Pulmonary/ 1st office eval/Ashley [name]  Chief Complaint  Patient presents with   Consult    COPD-sob and weak.  Dyspnea:getting worse x sev years to point where best days could still do a whole grocery aisle s stopping a month prior to OV   Cough: smoker's rattle > clear mucus  x 20 min  Sleep: bed is flat / a couple of pillows no noct resp cc  SABA use: not using  02 ldz:wnwz Wax and wane L lower post cp x first of year.positional not pleuritic or exertional  Rec My office will be contacting you by phone for referral to lung cancer screening   [phone]) > not done as of 01/14/2024  Plan A = Automatic = Always=    trelegy 100 one click each am  Plan [name] = Backup (to supplement plan A, not to replace it) Only use your albuterol  inhaler as a rescue medication  Plan C = Crisis (instead of Plan [name] but only if Plan [name] stops working) - only use your albuterol  nebulizer if you first try Plan [name]   Also  Ok to try albuterol  15 min before an activity (on alternating days the inhaler or nebulizer)  that you know would usually make you short of breath    Please schedule a follow up visit in 3 months but call sooner if needed with pfts on return    01/14/2024  f/u ov/Ashley [name] re: ***   maint on *** needs LDSCat ***  No chief complaint on file.   Dyspnea:  *** Cough: *** Sleeping: *** resp cc  SABA use: *** 02: ***  Lung cancer screening :  ***    No obvious day to day or daytime variability or assoc excess/ purulent sputum or mucus plugs or hemoptysis or cp or chest tightness, subjective wheeze or overt sinus or hb symptoms.    Also denies any obvious fluctuation of symptoms with weather or environmental  changes or other aggravating or alleviating factors except as outlined above   No unusual exposure hx or h/o childhood pna/ asthma or knowledge of premature birth.  Current Allergies, Complete Past Medical History, Past Surgical History, Family History, and Social History were reviewed in [employer] record.  ROS  The following are not active complaints unless bolded Hoarseness, sore throat, dysphagia, dental problems, itching, sneezing,  nasal congestion or discharge of excess mucus or purulent secretions, ear ache,   fever, chills, sweats, unintended wt loss or wt gain, classically pleuritic or exertional cp,  orthopnea pnd or arm/hand swelling  or leg swelling, presyncope, palpitations, abdominal pain, anorexia, nausea, vomiting, diarrhea  or change in bowel habits or change in bladder habits, change in stools or change in urine, dysuria, hematuria,  rash, arthralgias, visual complaints, headache, numbness, weakness or ataxia or problems with walking or coordination,  change in mood or  memory.        No outpatient medications have been marked as taking for the 01/14/24 encounter (Appointment) with Ashley Raspberry B, MD.            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  genotype 2B.  Epclusa  started early 04/2020.     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  Chronic microvascular ischemic changes w parenchymal  volume loss. Chronic white matter periventricular microhemorrhage, likely due to htn   Ulcer 1990   gastric ulcer. Ruptured s/p emergency repair      Objective:     Wt Readings from Last 3 Encounters:  01/05/24 98 lb (44.5 kg)  12/31/23 98 lb 6.4 oz (44.6 kg)  12/24/23 98 lb 15.8 oz (44.9 kg)      Vital signs reviewed  01/14/2024  - Note at rest 02 sats  ***% on ***   General appearance:    ***               Assessment

## 2024-01-11 ENCOUNTER — Encounter: Admitting: Student

## 2024-01-12 ENCOUNTER — Ambulatory Visit

## 2024-01-12 VITALS — BP 145/64 | HR 74 | Temp 98.0°F | Ht 62.0 in | Wt 101.6 lb

## 2024-01-12 DIAGNOSIS — M25511 Pain in right shoulder: Secondary | ICD-10-CM | POA: Diagnosis not present

## 2024-01-12 DIAGNOSIS — G8911 Acute pain due to trauma: Secondary | ICD-10-CM | POA: Diagnosis not present

## 2024-01-12 DIAGNOSIS — K921 Melena: Secondary | ICD-10-CM

## 2024-01-12 DIAGNOSIS — R413 Other amnesia: Secondary | ICD-10-CM

## 2024-01-12 DIAGNOSIS — R638 Other symptoms and signs concerning food and fluid intake: Secondary | ICD-10-CM

## 2024-01-12 DIAGNOSIS — K279 Peptic ulcer, site unspecified, unspecified as acute or chronic, without hemorrhage or perforation: Secondary | ICD-10-CM | POA: Diagnosis not present

## 2024-01-12 DIAGNOSIS — Z741 Need for assistance with personal care: Secondary | ICD-10-CM

## 2024-01-12 MED ORDER — DICLOFENAC SODIUM 1 % EX GEL: 4.0000 g | Freq: Four times a day (QID) | CUTANEOUS | 3 refills | Status: AC

## 2024-01-12 MED ORDER — PANTOPRAZOLE SODIUM 40 MG PO TBEC: 40.0000 mg | DELAYED_RELEASE_TABLET | Freq: Two times a day (BID) | ORAL | 1 refills | Status: DC

## 2024-01-12 NOTE — Progress Notes (Unsigned)
 CC: 1 month since fall on right shoulder, still hurting. Memory issues, Weak, Not Eating  HPI:  [name] is a 73 y.o. female with pertinent past medical history of ESRD, peptic ulcer disease, HTN, hx CVA, chronic pancreatitis (further medical history stated below) and presents today for CC of right shoulder pain 1 month after fall, feeling weak, and not eating. Please see problem based assessment and plan for additional details.  Last clinic appointment: 12/31/2023 with Dr. Tan  Last Hospitalization documented: 12/22/2023 for psychosis including hallucinations and paranoia 12/18/2023 -- fall in ED, Xrays obtained and no acute traumatic findings.   Previous PCP is Prentice Sharps at [facility].  Last Pertinent Labs Documented:  Lipase 32    Latest Ref Rng & Units 01/08/2024    2:45 PM 01/05/2024    4:57 PM 12/31/2023   10:55 AM  BMP  Glucose 70 - 99 mg/dL 95  867  74   BUN 8 - 23 mg/dL 69  26  33   Creatinine 0.44 - 1.00 mg/dL 86.76  2.33  1.44   BUN/Creat Ratio 12 - 28   4   Sodium 135 - 145 mmol/L 134  133  139   Potassium 3.5 - 5.1 mmol/L 6.2  5.3  4.9   Chloride 98 - 111 mmol/L 95  91  93   CO2 22 - 32 mmol/L 21  26  22    Calcium  8.9 - 10.3 mg/dL 9.7  89.6  89.2        Latest Ref Rng & Units 01/08/2024    1:27 PM 01/05/2024    4:57 PM 12/23/2023    4:42 AM  CBC  WBC 4.0 - 10.5 K/uL 13.0  10.0  6.7   Hemoglobin 12.0 - 15.0 g/dL 9.7  88.4  89.4   Hematocrit 36.0 - 46.0 % 30.3  35.8  31.3   Platelets 150 - 400 K/uL 399  480  303     Lab Results  Component Value Date   HGBA1C 5.1 06/14/2017   HGBA1C 5.8 09/19/2014   HGBA1C 6.0 (H) 02/17/2011    Past Medical History:  Diagnosis Date   Acute pancreatitis 2000   2000, 12/2018, 08/2019   Arthritis    Cervical radiculopathy 02/28/2011   Cocaine substance abuse (HCC) 05/26/2013   positive UDS    COPD (chronic obstructive pulmonary disease) (HCC)    COPD (chronic obstructive pulmonary disease) (HCC) 12/22/2023    Duodenitis    Dyspnea    Erosive gastropathy    ESRD on hemodialysis (HCC)    TTS   GERD (gastroesophageal reflux disease)    Hepatitis C 1987   dt hx IVDA.  genotype 2B.  Epclusa  started early 04/2020.     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  Chronic microvascular ischemic changes w parenchymal volume loss. Chronic white matter periventricular microhemorrhage, likely due to htn   Ulcer 1990   gastric ulcer. Ruptured s/p emergency repair    Current Outpatient Medications on File Prior to Visit  Medication Sig Dispense Refill   acetaminophen  (TYLENOL ) 325 MG tablet Take 2 tablets (650 mg total) by mouth every 6 (six) hours as needed for moderate pain (pain score  4-6).     albuterol  (VENTOLIN  HFA) 108 (90 Base) MCG/ACT inhaler Inhale 2 puffs into the lungs every 6 (six) hours as needed for wheezing or shortness of breath. 8 g 2   amLODipine  (NORVASC ) 10 MG tablet Take 1 tablet (10 mg total) by mouth daily. (Patient taking differently: Take 10 mg by mouth at bedtime.) 90 tablet 1   atorvastatin  (LIPITOR) 40 MG tablet Take 1 tablet (40 mg total) by mouth daily. 30 tablet 11   CREON  12000-38000 units CPEP capsule Take 2-4 capsules by mouth See admin instructions. Take 4 capsule by mouth three times a day with meals and take 2 capsule by mouth with snacks     dicyclomine  (BENTYL ) 20 MG tablet Take 1 tablet (20 mg total) by mouth 2 (two) times daily. 20 tablet 0   Fluticasone-Umeclidin-Vilant (TRELEGY ELLIPTA ) 100-62.5-25 MCG/ACT AEPB Inhale 1 puff into the lungs daily.     folic acid -vitamin b complex-vitamin c-selenium-zinc  (DIALYVITE ) 3 MG TABS tablet Take 1 tablet by mouth daily.     gabapentin  (NEURONTIN ) 100 MG capsule Take 2  capsules (200 mg total) by mouth at bedtime.     hydrALAZINE  (APRESOLINE ) 25 MG tablet Take 3 tablets (75 mg total) by mouth every 8 (eight) hours. 270 tablet 2   ipratropium-albuterol  (DUONEB) 0.5-2.5 (3) MG/3ML SOLN Take 3 mLs by nebulization 3 (three) times daily. (Patient taking differently: Take 3 mLs by nebulization every 6 (six) hours as needed (For shortness of breath).) 360 mL 1   ondansetron  (ZOFRAN -ODT) 4 MG disintegrating tablet Take 1 tablet (4 mg total) by mouth every 8 (eight) hours as needed for nausea or vomiting. 20 tablet 0   pantoprazole  (PROTONIX ) 40 MG tablet Take 1 tablet (40 mg total) by mouth daily. 30 tablet 2   sevelamer  carbonate (RENVELA ) 800 MG tablet Take 800 mg by mouth 3 (three) times daily with meals.     No current facility-administered medications on file prior to visit.    Family History  Problem Relation Age of Onset   Hypertension Father    Cancer Father    Hyperlipidemia Father    Seizures Sister    Early death Daughter    Kidney disease Daughter        end stage dialysis dependent    Breast cancer Maternal Aunt     Social History   Socioeconomic History   Marital status: Widowed    Spouse name: Not on file   Number of children: 3   Years of education: Not on file   Highest education level: Not on file  Occupational History   Occupation: retired  Tobacco Use   Smoking status: Every Day    Current packs/day: 0.25    Average packs/day: 0.3 packs/day for 40.0 years (10.0 ttl pk-yrs)    Types: Cigarettes    Passive exposure: Current   Smokeless tobacco: Never   Tobacco comments:    Smoking 4 per day, trying to quit.  Patches do not help.  She would like something prescribed to help.  11/20/2023 hfb  Vaping Use   Vaping status: Never Used  Substance and Sexual Activity   Alcohol use: No    Alcohol/week: 0.0 standard drinks of alcohol   Drug use: Not Currently    Types: Heroin, Marijuana, Cocaine    Comment: Has not used cocaine in 15  years. Smokes marijuana occasionally   Sexual activity: Never  Other Topics Concern   Not on file  Social History Narrative   Not  on file   Social Drivers of Health   Financial Resource Strain: Not on file  Food Insecurity: Patient Unable To Answer (12/22/2023)   Hunger Vital Sign    Worried About Running Out of Food in the Last Year: Patient unable to answer    Ran Out of Food in the Last Year: Patient unable to answer  Transportation Needs: Patient Unable To Answer (12/22/2023)   PRAPARE - Transportation    Lack of Transportation (Medical): Patient unable to answer    Lack of Transportation (Non-Medical): Patient unable to answer  Physical Activity: Not on file  Stress: Not on file  Social Connections: Unknown (12/22/2023)   Social Connection and Isolation Panel    Frequency of Communication with Friends and Family: Patient unable to answer    Frequency of Social Gatherings with Friends and Family: Patient unable to answer    Attends Religious Services: Not on file    Active Member of Clubs or Organizations: Patient unable to answer    Attends Banker Meetings: Patient unable to answer    Marital Status: Patient unable to answer  Intimate Partner Violence: Not At Risk (12/22/2023)   Humiliation, Afraid, Rape, and Kick questionnaire    Fear of Current or Ex-Partner: No    Emotionally Abused: No    Physically Abused: No    Sexually Abused: No    Review of Systems: Review of Systems  Constitutional:  Negative for chills, fever and weight loss (started losing weight (134-->101) over past year).       Will wake up in middle of night with cold sweats if didn't eat well  Respiratory:  Positive for shortness of breath (since she was in hospital last; only with exrertion).   Cardiovascular:  Negative for chest pain.  Gastrointestinal:  Positive for abdominal pain (epigastric region) and melena. Negative for blood in stool, constipation, diarrhea, heartburn, nausea and  vomiting.  Genitourinary:        Does not produce urine   Musculoskeletal:        Right shoulder pain  Psychiatric/Behavioral:  Negative for hallucinations.        No Auditory, visual, and tactile. No paranoia      Vitals:   01/12/24 0907 01/12/24 0919  BP: (!) 153/61 (!) 145/64  Pulse: 75 74  Temp: 98 F (36.7 C)   TempSrc: Oral   SpO2: 100%   Weight: 101 lb 9.6 oz (46.1 kg)   Height: 5' 2 (1.575 m)     Physical Exam: Physical Exam Cardiovascular:     Rate and Rhythm: Normal rate and regular rhythm.     Heart sounds: Normal heart sounds.     Comments: Increased palpable cardiac apex Pulmonary:     Effort: Pulmonary effort is normal.     Breath sounds: Normal breath sounds. No stridor. No wheezing, rhonchi or rales.  Musculoskeletal:        General: Tenderness (right shoulder) present. No swelling.     Right lower leg: No edema.     Left lower leg: No edema.     Comments: Empty can test positive, ROM limited on right shoulder  Skin:    General: Skin is warm and dry.  Neurological:     Mental Status: She is alert and oriented to person, place, and time.     Assessment & Plan:   Patient seen with Dr. Lovie Assessment & Plan Melena Patient and daughter describe black stool with every bowel movement since July 2025. No  reports of taking peptobismol or iron . Associated with belly pain/cramping in lower abdomen. No reports of hematemesis. Patient has been seen in ED twice within the past 2 weeks for pain in abdomen and gastroenteritis.  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  Patient seen by Dr. San in ED  (gastroenterologist at [facility]). patient currently taking pantoprazole  40 mg once daily. -instructed to call GI doctor to set up appointment as soon as possible. Provided patient with cell phone number  -instructed patient to take pantoprazole  40 mg twice daily until seen by GI Acute pain of right shoulder due to trauma Fall occurred about once month ago. Patient was seen in ED where she was provided a sling, lidocaine  patches, and instructed to continue tylenol  and motrin. Patient reports using lidocaine  patches (1 patch every 24 hours) and receives Tyelnol and Motrin at dialysis, but she said this doesn't help. Physical therapy set up to start  -instructed to work with PT  -start Voltaren  Gel  -If pain persists after working with PT, consider MRI and injection therapy Requires assistance with activities of daily living (ADL) Patient's daughter was here during appointment to help provide information. Patient's daughter also requesting FMLA forms to be filled out. She works from home but requires time to take her mom to doctors appointments and help care for her during the day  -completed FMLA form Memory deficit Changes in memory noticed about 1 month ago. Patient's daughter noticing difference and mood swings in AM. When going to bed, patient is happy and talking, but when waking up she has a new look on her face and if often mad and confused. Patient has not forgotten people; it is more paranoid thought and forgetting where she is, or thinking she's somewhere she is not, and forgetting things you just told her. During appointment today, patient was Aox3 (person, place, time). Patient has not seen a neurologist. Patient also reported feeling unbalanced when walking - uses a Rolator at home. Patient does not produce urine. Patient previously hospitalized for paranoia and hallucinations (12/22/2023); patient stated in ROS no incidences of paranoia or Tactile, visual, auditory hallucinations  since discharge. -scheduling appointment with Dr. Trudy with [facility] for cognitive/behavioral/mobility assessment Concern about food or nutrition Patient daughter reports her mom has been experiencing weakness and not eating. Patient reports wanting to eat but feels full. She states she does have an appetite if its specific meals that she likes to eat. Patient has tried nutrition shakes, but doesn't like them because she feels like they will ruin her appetite for an actual meal. -holding off on any intervention at this time until workup can be done with melena/abdominal pain   No orders of the defined types were placed in this encounter.    Sallyanne Primas, D.O. [facility], PGY-1 Date 01/13/2024 Time 7:29 AM

## 2024-01-12 NOTE — Patient Instructions (Addendum)
 Today we discussed the following medical conditions and plan:   Black Stool:  -call [phone] [[facility]] to schedule an appointment soon. Tell them you have seen Dr. San in the ED in the past.  -take pantoprazole  twice a day. (40 mg in the morning AND at night)  Shoulder Pain:  -Start Voltaren  Gel  -Work with Physical Therapy  -If these things don't work, we can consider escalation to injections.   Weakness/Not eating: -we are going to hold off on starting new medications until we work up what's going on in your stomach.  Memory:  -I am going to schedule an appointment with Dr. Trudy ([facility] on Tuesdays and Thursdays) -They will do an extended cognitive/behavioral/mobility assessment    We look forward to seeing you next time. Please call our clinic at [phone] if you have any questions or concerns. The best time to call is Monday-Friday from 9am-4pm, but there is someone available 24/7. If you need medication refills, please notify your pharmacy one week in advance and they will send us  a request.   Thank you for trusting me with your care. Wishing you the best!   Sallyanne Primas, DO  [facility]

## 2024-01-13 ENCOUNTER — Other Ambulatory Visit: Payer: Self-pay | Admitting: *Deleted

## 2024-01-13 DIAGNOSIS — R413 Other amnesia: Secondary | ICD-10-CM | POA: Insufficient documentation

## 2024-01-13 DIAGNOSIS — R638 Other symptoms and signs concerning food and fluid intake: Secondary | ICD-10-CM | POA: Insufficient documentation

## 2024-01-13 DIAGNOSIS — R0609 Other forms of dyspnea: Secondary | ICD-10-CM

## 2024-01-13 DIAGNOSIS — F17210 Nicotine dependence, cigarettes, uncomplicated: Secondary | ICD-10-CM

## 2024-01-13 NOTE — Assessment & Plan Note (Signed)
 Fall occurred about once month ago. Patient was seen in ED where she was provided a sling, lidocaine  patches, and instructed to continue tylenol  and motrin. Patient reports using lidocaine  patches (1 patch every 24 hours) and receives Tyelnol and Motrin at dialysis, but she said this doesn't help. Physical therapy set up to start  -instructed to work with PT  -start Voltaren  Gel  -If pain persists after working with PT, consider MRI and injection therapy

## 2024-01-13 NOTE — Progress Notes (Signed)
 Internal Medicine Clinic Attending  I was physically present during the key portions of the resident provided service and participated in the medical decision making of patient's management care. I reviewed pertinent patient test results.  The assessment, diagnosis, and plan were formulated together and I agree with the documentation in the resident's note.  Ashley Clarity, MD   Very complex patient here for follow up of numerous chronic medical conditions.  She is beginning [facility] PT tomorrow, I hope this will help her mobility and shoulder pain. She hasn't tried Voltaren  gel yet, but will pick this up from the [facility] pharmacy today  She reports having black stools for the past month - H/H was stable on recent hospitalization. I urged her and her daughter to please call her Gastroenterologist today, and we provided them with the phone number. In the meantime, we will increase PPI to BID and counseled them on return precautions.   Patient's daughter is also worried generally about the patient's mobility and cognition - I think she would benefit from seeing our [facility] with Dr. Trudy, so we will help arrange this

## 2024-01-13 NOTE — Assessment & Plan Note (Signed)
 Changes in memory noticed about 1 month ago. Patient's daughter noticing difference and mood swings in AM. When going to bed, patient is happy and talking, but when waking up she has a new look on her face and if often mad and confused. Patient has not forgotten people; it is more paranoid thought and forgetting where she is, or thinking she's somewhere she is not, and forgetting things you just told her. During appointment today, patient was Aox3 (person, place, time). Patient has not seen a neurologist. Patient also reported feeling unbalanced when walking - uses a Rolator at home. Patient does not produce urine. Patient previously hospitalized for paranoia and hallucinations (12/22/2023); patient stated in ROS no incidences of paranoia or Tactile, visual, auditory hallucinations since discharge. -scheduling appointment with Dr. Trudy with [facility] for cognitive/behavioral/mobility assessment

## 2024-01-13 NOTE — Assessment & Plan Note (Signed)
 Patient daughter reports her mom has been experiencing weakness and not eating. Patient reports wanting to eat but feels full. She states she does have an appetite if its specific meals that she likes to eat. Patient has tried nutrition shakes, but doesn't like them because she feels like they will ruin her appetite for an actual meal. -holding off on any intervention at this time until workup can be done with melena/abdominal pain

## 2024-01-13 NOTE — Addendum Note (Signed)
 Addended by: Tieisha Darden L on: 01/13/2024 10:43 AM   Modules accepted: Level of Service

## 2024-01-13 NOTE — Assessment & Plan Note (Signed)
 Patient's daughter was here during appointment to help provide information. Patient's daughter also requesting FMLA forms to be filled out. She works from home but requires time to take her mom to doctors appointments and help care for her during the day  -completed FMLA form

## 2024-01-14 ENCOUNTER — Ambulatory Visit: Admitting: Internal Medicine

## 2024-01-14 ENCOUNTER — Encounter: Payer: Self-pay | Admitting: Internal Medicine

## 2024-01-14 ENCOUNTER — Encounter: Admitting: Internal Medicine

## 2024-01-14 NOTE — Progress Notes (Deleted)
 No show

## 2024-01-14 NOTE — Patient Instructions (Incomplete)
 No show

## 2024-01-28 ENCOUNTER — Ambulatory Visit

## 2024-01-28 ENCOUNTER — Ambulatory Visit: Payer: Self-pay

## 2024-01-28 VITALS — BP 125/56 | HR 79 | Temp 98.3°F | Ht 62.0 in | Wt 98.8 lb

## 2024-01-28 DIAGNOSIS — R238 Other skin changes: Secondary | ICD-10-CM | POA: Diagnosis not present

## 2024-01-28 DIAGNOSIS — K921 Melena: Secondary | ICD-10-CM

## 2024-01-28 DIAGNOSIS — G8911 Acute pain due to trauma: Secondary | ICD-10-CM

## 2024-01-28 DIAGNOSIS — K279 Peptic ulcer, site unspecified, unspecified as acute or chronic, without hemorrhage or perforation: Secondary | ICD-10-CM | POA: Diagnosis not present

## 2024-01-28 DIAGNOSIS — R413 Other amnesia: Secondary | ICD-10-CM

## 2024-01-28 DIAGNOSIS — R011 Cardiac murmur, unspecified: Secondary | ICD-10-CM

## 2024-01-28 DIAGNOSIS — R638 Other symptoms and signs concerning food and fluid intake: Secondary | ICD-10-CM

## 2024-01-28 DIAGNOSIS — M25511 Pain in right shoulder: Secondary | ICD-10-CM

## 2024-01-28 MED ORDER — VALACYCLOVIR HCL 1 G PO TABS: 1000.0000 mg | ORAL_TABLET | Freq: Three times a day (TID) | ORAL | 0 refills | Status: DC

## 2024-01-28 MED ORDER — HYDROCORTISONE 1 % EX CREA: TOPICAL_CREAM | CUTANEOUS | 11 refills | Status: AC

## 2024-01-28 NOTE — Patient Instructions (Addendum)
 Today we discussed the following medical conditions and plan:   Treatment of left sided Rash -We are treating rash as shingles to be safe, you will take this pill three times a day for 7 days. Resolution will be once all the vesicles have crusted over.  -you have to drink a LOT of water while taking this medication, it can cause harm to kidneys and irritation to esophagus  -for the itching, we will prescribe a steroid cream  -even after treatment, you can have post shingles nerve pain, this can last for longer than the shingles. If they persist after treatment is finished, there are other medications you can take.   Members of the home -daughter who is pregnant should stay away from Mrs. Prell in the meantime. No contact with lesion or sharing of food.  -others, make sure still keep distance and wash hands  -you will be safe from transmission once all the vesicles have crusted over.  -if young children are present, they should get their chicken pox vaccine -older adults should get shingles vaccine.   Past appointment  -follow up with the stomach doctor (GI doctor) -make appointment for Dr. Trudy with geriatrics -f/u with echo for heart  -Keep doing PT for your right shoulder. Use the Voltaren  gel too   We look forward to seeing you next time. Please call our clinic at [phone] if you have any questions or concerns. The best time to call is Monday-Friday from 9am-4pm, but there is someone available 24/7. If you need medication refills, please notify your pharmacy one week in advance and they will send us  a request.   Thank you for trusting me with your care. Wishing you the best!   Sallyanne Primas, DO  [facility]

## 2024-01-28 NOTE — Progress Notes (Unsigned)
 CC: Rash  HPI:  [name] is a 73 y.o. female with pertinent past medical history of ESRD, peptic ulcer disease, HTN, hx CVA, chronic pancreatitis, and recent hospitalization for psychosis including hallucinations and paranoia 12/18/2023 (further medical history stated below) and presents today for Rash. Please see problem based assessment and plan for additional details.  Last clinic appointment: 01/12/2024 discussing   Last Pertinent Labs Documented:     Latest Ref Rng & Units 01/08/2024    2:45 PM 01/05/2024    4:57 PM 12/31/2023   10:55 AM  BMP  Glucose 70 - 99 mg/dL 95  867  74   BUN 8 - 23 mg/dL 69  26  33   Creatinine 0.44 - 1.00 mg/dL 86.76  2.33  1.44   BUN/Creat Ratio 12 - 28   4   Sodium 135 - 145 mmol/L 134  133  139   Potassium 3.5 - 5.1 mmol/L 6.2  5.3  4.9   Chloride 98 - 111 mmol/L 95  91  93   CO2 22 - 32 mmol/L 21  26  22    Calcium  8.9 - 10.3 mg/dL 9.7  89.6  89.2        Latest Ref Rng & Units 01/08/2024    1:27 PM 01/05/2024    4:57 PM 12/23/2023    4:42 AM  CBC  WBC 4.0 - 10.5 K/uL 13.0  10.0  6.7   Hemoglobin 12.0 - 15.0 g/dL 9.7  88.4  89.4   Hematocrit 36.0 - 46.0 % 30.3  35.8  31.3   Platelets 150 - 400 K/uL 399  480  303     Lab Results  Component Value Date   HGBA1C 5.1 06/14/2017   HGBA1C 5.8 09/19/2014   HGBA1C 6.0 (H) 02/17/2011     Past Medical History:  Diagnosis Date   Acute pancreatitis 2000   2000, 12/2018, 08/2019   Arthritis    Cervical radiculopathy 02/28/2011   Cocaine substance abuse (HCC) 05/26/2013   positive UDS    COPD (chronic obstructive pulmonary disease) (HCC)    COPD (chronic obstructive pulmonary disease) (HCC) 12/22/2023   Duodenitis    Dyspnea    Erosive gastropathy    ESRD on hemodialysis (HCC)    TTS   GERD (gastroesophageal reflux disease)    Hepatitis C 1987   dt hx IVDA.  genotype 2B.  Epclusa  started early 04/2020.     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  Chronic microvascular ischemic changes w parenchymal volume loss. Chronic white matter periventricular microhemorrhage, likely due to htn   Ulcer 1990   gastric ulcer. Ruptured s/p emergency repair    Current Outpatient Medications on File Prior to Visit  Medication Sig Dispense Refill   acetaminophen  (TYLENOL ) 325 MG tablet Take 2 tablets (650 mg total) by mouth every 6 (six) hours as needed for moderate pain (pain score 4-6).     albuterol  (VENTOLIN  HFA) 108 (90 Base) MCG/ACT inhaler Inhale 2 puffs into the lungs every 6 (six) hours as needed for wheezing or shortness of breath. 8 g 2   amLODipine  (NORVASC ) 10 MG tablet Take 1 tablet (10 mg total) by mouth daily. (Patient taking  differently: Take 10 mg by mouth at bedtime.) 90 tablet 1   atorvastatin  (LIPITOR) 40 MG tablet Take 1 tablet (40 mg total) by mouth daily. 30 tablet 11   CREON  12000-38000 units CPEP capsule Take 2-4 capsules by mouth See admin instructions. Take 4 capsule by mouth three times a day with meals and take 2 capsule by mouth with snacks     diclofenac  Sodium (VOLTAREN ) 1 % GEL Apply 4 g topically 4 (four) times daily. 120 g 3   dicyclomine  (BENTYL ) 20 MG tablet Take 1 tablet (20 mg total) by mouth 2 (two) times daily. 20 tablet 0   Fluticasone-Umeclidin-Vilant (TRELEGY ELLIPTA ) 100-62.5-25 MCG/ACT AEPB Inhale 1 puff into the lungs daily.     folic acid -vitamin b complex-vitamin c-selenium-zinc  (DIALYVITE ) 3 MG TABS tablet Take 1 tablet by mouth daily.     gabapentin  (NEURONTIN ) 100 MG capsule Take 2 capsules (200 mg total) by mouth at bedtime.     hydrALAZINE  (APRESOLINE ) 25 MG tablet Take 3 tablets (75 mg total) by mouth every 8 (eight) hours. 270 tablet 2   ipratropium-albuterol  (DUONEB) 0.5-2.5 (3)  MG/3ML SOLN Take 3 mLs by nebulization 3 (three) times daily. (Patient taking differently: Take 3 mLs by nebulization every 6 (six) hours as needed (For shortness of breath).) 360 mL 1   ondansetron  (ZOFRAN -ODT) 4 MG disintegrating tablet Take 1 tablet (4 mg total) by mouth every 8 (eight) hours as needed for nausea or vomiting. 20 tablet 0   pantoprazole  (PROTONIX ) 40 MG tablet Take 1 tablet (40 mg total) by mouth daily. 30 tablet 2   pantoprazole  (PROTONIX ) 40 MG tablet Take 1 tablet (40 mg total) by mouth 2 (two) times daily. 60 tablet 1   sevelamer  carbonate (RENVELA ) 800 MG tablet Take 800 mg by mouth 3 (three) times daily with meals.     No current facility-administered medications on file prior to visit.    Family History  Problem Relation Age of Onset   Hypertension Father    Cancer Father    Hyperlipidemia Father    Seizures Sister    Early death Daughter    Kidney disease Daughter        end stage dialysis dependent    Breast cancer Maternal Aunt     Social History   Socioeconomic History   Marital status: Widowed    Spouse name: Not on file   Number of children: 3   Years of education: Not on file   Highest education level: Not on file  Occupational History   Occupation: retired  Tobacco Use   Smoking status: Every Day    Current packs/day: 0.25    Average packs/day: 0.3 packs/day for 40.0 years (10.0 ttl pk-yrs)    Types: Cigarettes    Passive exposure: Current   Smokeless tobacco: Never   Tobacco comments:    Smoking 4 per day, trying to quit.  Patches do not help.  She would like something prescribed to help.  11/20/2023 hfb  Vaping Use   Vaping status: Never Used  Substance and Sexual Activity   Alcohol use: No    Alcohol/week: 0.0 standard drinks of alcohol   Drug use: Not Currently    Types: Heroin, Marijuana, Cocaine    Comment: Has not used cocaine in 15 years. Smokes marijuana occasionally   Sexual activity: Never  Other Topics Concern   Not on  file  Social History Narrative   Not on file   Social Drivers of Health   Financial  Resource Strain: Not on file  Food Insecurity: Patient Unable To Answer (12/22/2023)   Hunger Vital Sign    Worried About Running Out of Food in the Last Year: Patient unable to answer    Ran Out of Food in the Last Year: Patient unable to answer  Transportation Needs: Patient Unable To Answer (12/22/2023)   PRAPARE - Transportation    Lack of Transportation (Medical): Patient unable to answer    Lack of Transportation (Non-Medical): Patient unable to answer  Physical Activity: Not on file  Stress: Not on file  Social Connections: Unknown (12/22/2023)   Social Connection and Isolation Panel    Frequency of Communication with Friends and Family: Patient unable to answer    Frequency of Social Gatherings with Friends and Family: Patient unable to answer    Attends Religious Services: Not on file    Active Member of Clubs or Organizations: Patient unable to answer    Attends Banker Meetings: Patient unable to answer    Marital Status: Patient unable to answer  Intimate Partner Violence: Not At Risk (12/22/2023)   Humiliation, Afraid, Rape, and Kick questionnaire    Fear of Current or Ex-Partner: No    Emotionally Abused: No    Physically Abused: No    Sexually Abused: No    Review of Systems: Review of Systems  Constitutional:  Negative for chills and fever.  HENT:  Negative for hearing loss.   Eyes:  Negative for blurred vision.  Respiratory:  Negative for shortness of breath.   Cardiovascular:  Negative for chest pain.  Gastrointestinal:  Negative for melena.  Skin:  Positive for itching and rash.  Neurological:  Positive for headaches (new headaches since this popped up. across head like a band. only drinking 3 cups of coffee a day).     Vitals:   01/28/24 1602  BP: (!) 125/56  Pulse: 79  Temp: 98.3 F (36.8 C)  TempSrc: Oral  SpO2: 95%  Weight: 98 lb 12.8 oz (44.8 kg)   Height: 5' 2 (1.575 m)    Physical Exam: Physical Exam Constitutional:      General: She is not in acute distress.    Appearance: She is not ill-appearing or toxic-appearing.  Cardiovascular:     Rate and Rhythm: Normal rate and regular rhythm.     Heart sounds: Murmur (Grade 3/6 continuous vs midsystolic murmur best heard of left 2nd ICS) heard.  Pulmonary:     Effort: Pulmonary effort is normal.     Breath sounds: Normal breath sounds. No stridor. No wheezing, rhonchi or rales.  Skin:    General: Skin is warm and dry.     Findings: Rash present.     Comments: Located behind left ear, along left side of neck, left shoulder, and left chest wall.  Not following dermatome pattern of distribution. Clusters of 2mm vesicles with surrounding erythema. The areas, especially along chest wall, are warm to touch. The lesions do not appear to cross midline.  Neurological:     Mental Status: She is alert.      Assessment & Plan:   Patient seen with Dr. Shawn Assessment & Plan Vesicular rash Patient presented to clinic with rash on left side noted by physical therapy and encouraged PCP to take a look. It started a couple of days ago behind her left ear and has since spread - Lesions are now located behind left ear, along left side of neck, left shoulder, and left chest wall. It  is not following classical dermatome pattern of distribution of shingles. Clusters of 2mm vesicles with surrounding erythema, black dots in center, no central umbilication. The areas, especially along chest wall, are warm to touch. The lesions do not appear to cross midline. The lesions are painful, described as a throbbing pain and they are itchy. She has not put any ointments or taken anything for the pain. Over the past couple of days, she said the pain hasn't gotten worse or any better.  Denies fevers or chills. Endorses loss of appetite since lesions appeared.  Stated she got a haircut with clippers 3 days ago and  she suspects it is likely from unclean clippers.  Patient has not had shingles vaccine. Has had chicken pox as a child.  Patient lives with daughter and granddaughter who is pregnant. Ddx: shingles vs. Folliculitis vs cellulitis. Does not follow dermatome patter, however, the cluster of vesicles and pain is concerning for possible atypical pattern of shingles.   Plan -valacyclovir  1,000 mg TID for 7 days with hopeful complete crusting of vesicles.  -instructed patient to drink plenty of water on this medication with risk of nephrotoxicity  -informed patient that if itching worsens, can prescribe steroid cream.  -encouraged everyone in home who has not gotten shingles vaccine to receive the vaccine at this time. Encouraged pregnant granddaughter to stay elsewhere in meantime Murmur, cardiac Last echo in 2022 demonstrated EF 40-45% with moderate concentric left ventricular hypertrophy Grade 1 diastolic dysfunction. LA size severely dilated, RA size severely dilated, trivial pericardial effusion present. Mild mitral valve regurgitation without evidence of stenosis. Mild TV regurg without evidence of TV stenosis. AV regurg not visualized. No AS present. No evidence of PV regurg or stenosis.  On physical exam, a grade 3/6 continuous vs midsystolic murmur was heard. Patient had been complaining of dyspnea and chest pain on exertion from last clinic visit 01/12/2024. -echo ordered to be completed within 6 months Peptic ulcer disease Melena Patient stated that melena has resolved. Previous appointment discussed that she had been having black stool with every bowel movement since July 2025 associated with belly pain/cramping in lower abdomen. Encouraged patient to follow up with GI per last appointment  -follow up with GI -continue taking pantoprazole  40 mg twice daily  Concern about food or nutrition Memory deficit Patient has not made appointment for Dr. Trudy yet. States that appetite had been up  since last appointment but has fallen within last couple of days due to rash  -encouraged to make appointment with Dr. Trudy for Geriatric evaluation Acute pain of right shoulder due to trauma Patient has been following up with PT, has not been using Voltaren  gel.  -continue PT -encouraged patient to use Voltaren  gel on right shoulder   Orders Placed This Encounter  Procedures   ECHOCARDIOGRAM COMPLETE    Standing Status:   Future    Expected Date:   07/30/2024    Expiration Date:   01/27/2025    Where should this test be performed:   [facility]    Perflutren DEFINITY (image enhancing agent) should be administered unless hypersensitivity or allergy exist:   Administer Perflutren    Reason for exam-Echo:   Murmur R01.1    Other Comments:   3+/6 murmur with exertional dyspnea     Darnelle Derrick, D.O. [facility] Medicine, PGY-1 Date 01/29/2024 Time 8:45 PM

## 2024-01-28 NOTE — Telephone Encounter (Signed)
 FYI Only or Action Required?: FYI only for provider.  Patient was last seen in primary care on 01/12/2024 by Rihner, Emilie, DO.  Called Nurse Triage reporting Rash.  Symptoms began several days ago. 2 days  Interventions attempted: Nothing.  Symptoms are: gradually worsening.  Triage Disposition: See Physician Within 24 Hours  Patient/caregiver understands and will follow disposition?: Yes- Patient has appt today with PCP at 3:45p  Copied from CRM #[id number]. Topic: Clinical - Red Word Triage >> Jan 28, 2024  1:53 PM Carrielelia G wrote: [facility] that prompted transfer to Nurse Triage: blister/rash left lateral posterior neck area and  Blister upper left shoulder ..pain is 7   Caller/Agency: [place] with [facility]  Callback Number: [phone] Service Requested: Physical Therapy  Any new concerns about the patient? Yes   blister/rash left lateral posterior neck area and  Blister upper left shoulder ..pain is 7 Reason for Disposition  [1] Shingles rash (matches SYMPTOMS) AND [2] onset < 72 hours ago (3 days)  Answer Assessment - Initial Assessment Questions 1. APPEARANCE of RASH: What does the rash look like?      Blisters, not a lot of reshness  2. LOCATION: Where is the rash located?      Upper left shoulder (trapezius area) and back of neck  3. ONSET: When did the rash start?      2 days ago  4. ITCHING: Does the rash itch? If Yes, ask: How bad is the itch?  (Scale 1-10; or mild, moderate, severe)     Mild to moderate itching  5. PAIN: Does the rash hurt? If Yes, ask: How bad is the pain?  (Scale 0-10; or none, mild, moderate, severe)     7/10 pain  6. OTHER SYMPTOMS: Do you have any other symptoms? (e.g., fever)     No  7. PREGNANCY: Is there any chance you are pregnant? When was your last menstrual period?     No  Protocols used: Shingles (Zoster)-A-AH

## 2024-01-29 DIAGNOSIS — K921 Melena: Secondary | ICD-10-CM | POA: Insufficient documentation

## 2024-01-29 DIAGNOSIS — R238 Other skin changes: Secondary | ICD-10-CM | POA: Insufficient documentation

## 2024-01-29 NOTE — Assessment & Plan Note (Addendum)
 Patient presented to clinic with rash on left side noted by physical therapy and encouraged PCP to take a look. It started a couple of days ago behind her left ear and has since spread - Lesions are now located behind left ear, along left side of neck, left shoulder, and left chest wall. It is not following classical dermatome pattern of distribution of shingles. Clusters of 2mm vesicles with surrounding erythema, black dots in center, no central umbilication. The areas, especially along chest wall, are warm to touch. The lesions do not appear to cross midline. The lesions are painful, described as a throbbing pain and they are itchy. She has not put any ointments or taken anything for the pain. Over the past couple of days, she said the pain hasn't gotten worse or any better.  Denies fevers or chills. Endorses loss of appetite since lesions appeared.  Stated she got a haircut with clippers 3 days ago and she suspects it is likely from unclean clippers.  Patient has not had shingles vaccine. Has had chicken pox as a child.  Patient lives with daughter and granddaughter who is pregnant. Ddx: shingles vs. Folliculitis vs cellulitis. Does not follow dermatome patter, however, the cluster of vesicles and pain is concerning for possible atypical pattern of shingles.   Plan -valacyclovir  1,000 mg TID for 7 days with hopeful complete crusting of vesicles.  -instructed patient to drink plenty of water on this medication with risk of nephrotoxicity  -informed patient that if itching worsens, can prescribe steroid cream.  -encouraged everyone in home who has not gotten shingles vaccine to receive the vaccine at this time. Encouraged pregnant granddaughter to stay elsewhere in meantime

## 2024-01-29 NOTE — Assessment & Plan Note (Signed)
 Patient has been following up with PT, has not been using Voltaren  gel.  -continue PT -encouraged patient to use Voltaren  gel on right shoulder

## 2024-01-29 NOTE — Assessment & Plan Note (Signed)
 Patient stated that melena has resolved. Previous appointment discussed that she had been having black stool with every bowel movement since July 2025 associated with belly pain/cramping in lower abdomen. Encouraged patient to follow up with GI per last appointment  -follow up with GI -continue taking pantoprazole  40 mg twice daily

## 2024-01-29 NOTE — Assessment & Plan Note (Signed)
 Patient has not made appointment for Dr. Trudy yet. States that appetite had been up since last appointment but has fallen within last couple of days due to rash  -encouraged to make appointment with Dr. Trudy for Geriatric evaluation

## 2024-01-30 ENCOUNTER — Other Ambulatory Visit: Payer: Self-pay

## 2024-01-30 ENCOUNTER — Emergency Department (HOSPITAL_COMMUNITY)

## 2024-01-30 ENCOUNTER — Inpatient Hospital Stay (HOSPITAL_COMMUNITY)

## 2024-01-30 ENCOUNTER — Inpatient Hospital Stay (HOSPITAL_COMMUNITY)
Admission: EM | Admit: 2024-01-30 | Discharge: 2024-02-02 | DRG: 091 | Disposition: A | Discharge status: Skilled Nursing Facility | Attending: Internal Medicine | Admitting: Internal Medicine

## 2024-01-30 DIAGNOSIS — I6502 Occlusion and stenosis of left vertebral artery: Secondary | ICD-10-CM | POA: Diagnosis present

## 2024-01-30 DIAGNOSIS — I120 Hypertensive chronic kidney disease with stage 5 chronic kidney disease or end stage renal disease: Secondary | ICD-10-CM | POA: Diagnosis present

## 2024-01-30 DIAGNOSIS — E785 Hyperlipidemia, unspecified: Secondary | ICD-10-CM | POA: Diagnosis present

## 2024-01-30 DIAGNOSIS — I6521 Occlusion and stenosis of right carotid artery: Secondary | ICD-10-CM | POA: Diagnosis present

## 2024-01-30 DIAGNOSIS — E875 Hyperkalemia: Secondary | ICD-10-CM | POA: Diagnosis present

## 2024-01-30 DIAGNOSIS — R29709 NIHSS score 9: Secondary | ICD-10-CM

## 2024-01-30 DIAGNOSIS — Z7982 Long term (current) use of aspirin: Secondary | ICD-10-CM | POA: Diagnosis not present

## 2024-01-30 DIAGNOSIS — D631 Anemia in chronic kidney disease: Secondary | ICD-10-CM | POA: Diagnosis present

## 2024-01-30 DIAGNOSIS — Z79899 Other long term (current) drug therapy: Secondary | ICD-10-CM | POA: Diagnosis not present

## 2024-01-30 DIAGNOSIS — M542 Cervicalgia: Secondary | ICD-10-CM | POA: Diagnosis present

## 2024-01-30 DIAGNOSIS — R29810 Facial weakness: Secondary | ICD-10-CM | POA: Diagnosis present

## 2024-01-30 DIAGNOSIS — F17210 Nicotine dependence, cigarettes, uncomplicated: Secondary | ICD-10-CM | POA: Diagnosis present

## 2024-01-30 DIAGNOSIS — T375X5A Adverse effect of antiviral drugs, initial encounter: Secondary | ICD-10-CM | POA: Diagnosis present

## 2024-01-30 DIAGNOSIS — E871 Hypo-osmolality and hyponatremia: Secondary | ICD-10-CM | POA: Diagnosis not present

## 2024-01-30 DIAGNOSIS — G9340 Encephalopathy, unspecified: Secondary | ICD-10-CM | POA: Diagnosis not present

## 2024-01-30 DIAGNOSIS — Z681 Body mass index (BMI) 19 or less, adult: Secondary | ICD-10-CM

## 2024-01-30 DIAGNOSIS — A858 Other specified viral encephalitis: Secondary | ICD-10-CM | POA: Diagnosis not present

## 2024-01-30 DIAGNOSIS — G928 Other toxic encephalopathy: Principal | ICD-10-CM | POA: Diagnosis present

## 2024-01-30 DIAGNOSIS — R64 Cachexia: Secondary | ICD-10-CM | POA: Diagnosis present

## 2024-01-30 DIAGNOSIS — B029 Zoster without complications: Secondary | ICD-10-CM | POA: Diagnosis present

## 2024-01-30 DIAGNOSIS — J449 Chronic obstructive pulmonary disease, unspecified: Secondary | ICD-10-CM | POA: Diagnosis present

## 2024-01-30 DIAGNOSIS — Z8249 Family history of ischemic heart disease and other diseases of the circulatory system: Secondary | ICD-10-CM | POA: Diagnosis not present

## 2024-01-30 DIAGNOSIS — R4781 Slurred speech: Secondary | ICD-10-CM | POA: Diagnosis not present

## 2024-01-30 DIAGNOSIS — Z8711 Personal history of peptic ulcer disease: Secondary | ICD-10-CM

## 2024-01-30 DIAGNOSIS — Z83438 Family history of other disorder of lipoprotein metabolism and other lipidemia: Secondary | ICD-10-CM

## 2024-01-30 DIAGNOSIS — N250 Renal osteodystrophy: Secondary | ICD-10-CM | POA: Diagnosis present

## 2024-01-30 DIAGNOSIS — Z7902 Long term (current) use of antithrombotics/antiplatelets: Secondary | ICD-10-CM

## 2024-01-30 DIAGNOSIS — R451 Restlessness and agitation: Secondary | ICD-10-CM | POA: Diagnosis not present

## 2024-01-30 DIAGNOSIS — Z8673 Personal history of transient ischemic attack (TIA), and cerebral infarction without residual deficits: Secondary | ICD-10-CM

## 2024-01-30 DIAGNOSIS — T375X4A Poisoning by antiviral drugs, undetermined, initial encounter: Secondary | ICD-10-CM | POA: Diagnosis not present

## 2024-01-30 DIAGNOSIS — Z992 Dependence on renal dialysis: Secondary | ICD-10-CM

## 2024-01-30 DIAGNOSIS — R4182 Altered mental status, unspecified: Secondary | ICD-10-CM | POA: Diagnosis present

## 2024-01-30 DIAGNOSIS — I639 Cerebral infarction, unspecified: Secondary | ICD-10-CM | POA: Diagnosis not present

## 2024-01-30 DIAGNOSIS — K219 Gastro-esophageal reflux disease without esophagitis: Secondary | ICD-10-CM | POA: Diagnosis present

## 2024-01-30 DIAGNOSIS — N186 End stage renal disease: Secondary | ICD-10-CM | POA: Diagnosis present

## 2024-01-30 DIAGNOSIS — I10 Essential (primary) hypertension: Secondary | ICD-10-CM | POA: Diagnosis present

## 2024-01-30 LAB — COMPREHENSIVE METABOLIC PANEL WITH GFR
ALT: 8 U/L (ref 0–44)
AST: 21 U/L (ref 15–41)
Albumin: 3.5 g/dL (ref 3.5–5.0)
Alkaline Phosphatase: 83 U/L (ref 38–126)
Anion gap: 14 (ref 5–15)
BUN: 24 mg/dL — ABNORMAL HIGH (ref 8–23)
CO2: 24 mmol/L (ref 22–32)
Calcium: 9.5 mg/dL (ref 8.9–10.3)
Chloride: 92 mmol/L — ABNORMAL LOW (ref 98–111)
Creatinine, Ser: 7.07 mg/dL — ABNORMAL HIGH (ref 0.44–1.00)
GFR, Estimated: 6 mL/min — ABNORMAL LOW (ref >60)
Glucose, Bld: 91 mg/dL (ref 70–99)
Potassium: 6.1 mmol/L — ABNORMAL HIGH (ref 3.5–5.1)
Sodium: 130 mmol/L — ABNORMAL LOW (ref 135–145)
Total Bilirubin: 0.5 mg/dL (ref 0.0–1.2)
Total Protein: 7.2 g/dL (ref 6.5–8.1)

## 2024-01-30 LAB — MENINGITIS/ENCEPHALITIS PANEL (CSF)

## 2024-01-30 LAB — I-STAT CHEM 8, ED
BUN: 34 mg/dL — ABNORMAL HIGH (ref 8–23)
Calcium, Ion: 0.97 mmol/L — ABNORMAL LOW (ref 1.15–1.40)
Chloride: 95 mmol/L — ABNORMAL LOW (ref 98–111)
Creatinine, Ser: 7.3 mg/dL — ABNORMAL HIGH (ref 0.44–1.00)
Glucose, Bld: 91 mg/dL (ref 70–99)
HCT: 46 % (ref 36.0–46.0)
Hemoglobin: 15.6 g/dL — ABNORMAL HIGH (ref 12.0–15.0)
Potassium: 6.4 mmol/L (ref 3.5–5.1)
Sodium: 127 mmol/L — ABNORMAL LOW (ref 135–145)
TCO2: 26 mmol/L (ref 22–32)

## 2024-01-30 LAB — APTT: aPTT: 36 s (ref 24–36)

## 2024-01-30 LAB — CSF CELL COUNT WITH DIFFERENTIAL
RBC Count, CSF: 1 /mm3 — ABNORMAL HIGH
RBC Count, CSF: 18 /mm3 — ABNORMAL HIGH
Tube #: 1
Tube #: 4
WBC, CSF: 1 /mm3 (ref 0–5)
WBC, CSF: 1 /mm3 (ref 0–5)

## 2024-01-30 LAB — CBC
HCT: 41 % (ref 36.0–46.0)
Hemoglobin: 12.9 g/dL (ref 12.0–15.0)
MCH: 28.5 pg (ref 26.0–34.0)
MCHC: 31.5 g/dL (ref 30.0–36.0)
MCV: 90.5 fL (ref 80.0–100.0)
Platelets: 224 K/uL (ref 150–400)
RBC: 4.53 MIL/uL (ref 3.87–5.11)
RDW: 18.7 % — ABNORMAL HIGH (ref 11.5–15.5)
WBC: 4.7 K/uL (ref 4.0–10.5)
nRBC: 0 % (ref 0.0–0.2)

## 2024-01-30 LAB — RENAL FUNCTION PANEL
Albumin: 3.2 g/dL — ABNORMAL LOW (ref 3.5–5.0)
Anion gap: 16 — ABNORMAL HIGH (ref 5–15)
BUN: 26 mg/dL — ABNORMAL HIGH (ref 8–23)
CO2: 25 mmol/L (ref 22–32)
Calcium: 9.2 mg/dL (ref 8.9–10.3)
Chloride: 89 mmol/L — ABNORMAL LOW (ref 98–111)
Creatinine, Ser: 7.35 mg/dL — ABNORMAL HIGH (ref 0.44–1.00)
GFR, Estimated: 5 mL/min — ABNORMAL LOW (ref >60)
Glucose, Bld: 81 mg/dL (ref 70–99)
Phosphorus: 7.1 mg/dL — ABNORMAL HIGH (ref 2.5–4.6)
Potassium: 5.3 mmol/L — ABNORMAL HIGH (ref 3.5–5.1)
Sodium: 130 mmol/L — ABNORMAL LOW (ref 135–145)

## 2024-01-30 LAB — DIFFERENTIAL
Abs Immature Granulocytes: 0.01 K/uL (ref 0.00–0.07)
Basophils Absolute: 0.1 K/uL (ref 0.0–0.1)
Basophils Relative: 1 %
Eosinophils Absolute: 0.2 K/uL (ref 0.0–0.5)
Eosinophils Relative: 3 %
Immature Granulocytes: 0 %
Lymphocytes Relative: 34 %
Lymphs Abs: 1.6 K/uL (ref 0.7–4.0)
Monocytes Absolute: 0.7 K/uL (ref 0.1–1.0)
Monocytes Relative: 15 %
Neutro Abs: 2.2 K/uL (ref 1.7–7.7)
Neutrophils Relative %: 47 %

## 2024-01-30 LAB — PROTIME-INR
INR: 1 (ref 0.8–1.2)
Prothrombin Time: 13.9 s (ref 11.4–15.2)

## 2024-01-30 LAB — HEPATITIS B SURFACE ANTIGEN: Hepatitis B Surface Ag: NONREACTIVE

## 2024-01-30 LAB — PROTEIN AND GLUCOSE, CSF
Glucose, CSF: 44 mg/dL (ref 40–70)
Total  Protein, CSF: 85 mg/dL — ABNORMAL HIGH (ref 15–45)

## 2024-01-30 LAB — ETHANOL: Alcohol, Ethyl (B): <15 mg/dL (ref <15)

## 2024-01-30 LAB — CBG MONITORING, ED
Glucose-Capillary: 99 mg/dL (ref 70–99)
Glucose-Capillary: 213 mg/dL — ABNORMAL HIGH (ref 70–99)

## 2024-01-30 MED ORDER — ALBUTEROL SULFATE HFA 108 (90 BASE) MCG/ACT IN AERS: 2.0000 | INHALATION_SPRAY | Freq: Four times a day (QID) | RESPIRATORY_TRACT | Status: DC | PRN

## 2024-01-30 MED ORDER — IOHEXOL 350 MG/ML SOLN
100.0000 mL | Freq: Once | INTRAVENOUS | Status: AC | PRN
Start: 2024-01-30 — End: 2024-01-30
  Administered 2024-01-30: 100 mL via INTRAVENOUS

## 2024-01-30 MED ORDER — HEPARIN SODIUM (PORCINE) 5000 UNIT/ML IJ SOLN
5000.0000 [IU] | Freq: Three times a day (TID) | INTRAMUSCULAR | Status: DC
  Administered 2024-01-30 – 2024-02-02 (×8): 5000 [IU] via SUBCUTANEOUS
  Filled 2024-01-30 (×8): qty 1

## 2024-01-30 MED ORDER — AMLODIPINE BESYLATE 5 MG PO TABS
10.0000 mg | ORAL_TABLET | Freq: Every day | ORAL | Status: DC
  Administered 2024-01-31 – 2024-02-01 (×2): 10 mg via ORAL
  Filled 2024-01-30 (×2): qty 2

## 2024-01-30 MED ORDER — ALBUTEROL SULFATE (2.5 MG/3ML) 0.083% IN NEBU: 2.5000 mg | INHALATION_SOLUTION | Freq: Four times a day (QID) | RESPIRATORY_TRACT | Status: DC | PRN

## 2024-01-30 MED ORDER — DIALYVITE 3000 3 MG PO TABS: 1.0000 | ORAL_TABLET | Freq: Every day | ORAL | Status: DC

## 2024-01-30 MED ORDER — BUDESON-GLYCOPYRROL-FORMOTEROL 160-9-4.8 MCG/ACT IN AERO
2.0000 | INHALATION_SPRAY | Freq: Two times a day (BID) | RESPIRATORY_TRACT | Status: DC
  Administered 2024-01-31 – 2024-02-02 (×5): 2 via RESPIRATORY_TRACT
  Filled 2024-01-30 (×2): qty 5.9

## 2024-01-30 MED ORDER — HYDRALAZINE HCL 50 MG PO TABS
75.0000 mg | ORAL_TABLET | Freq: Three times a day (TID) | ORAL | Status: DC
  Administered 2024-01-31 – 2024-02-02 (×7): 75 mg via ORAL
  Filled 2024-01-30 (×7): qty 1

## 2024-01-30 MED ORDER — SODIUM BICARBONATE 8.4 % IV SOLN
50.0000 meq | Freq: Once | INTRAVENOUS | Status: AC
  Administered 2024-01-30: 50 meq via INTRAVENOUS
  Filled 2024-01-30: qty 50

## 2024-01-30 MED ORDER — RENA-VITE PO TABS
1.0000 | ORAL_TABLET | Freq: Every day | ORAL | Status: DC
  Administered 2024-01-31 – 2024-02-01 (×2): 1 via ORAL
  Filled 2024-01-30 (×2): qty 1

## 2024-01-30 MED ORDER — INSULIN ASPART 100 UNIT/ML IV SOLN
5.0000 [IU] | Freq: Once | INTRAVENOUS | Status: AC
  Administered 2024-01-30: 5 [IU] via INTRAVENOUS

## 2024-01-30 MED ORDER — PANCRELIPASE (LIP-PROT-AMYL) 36000-114000 UNITS PO CPEP
48000.0000 [IU] | ORAL_CAPSULE | Freq: Three times a day (TID) | ORAL | Status: DC
  Administered 2024-01-31 – 2024-02-02 (×8): 48000 [IU] via ORAL
  Filled 2024-01-30 (×2): qty 1
  Filled 2024-01-30: qty 4
  Filled 2024-01-30 (×4): qty 1
  Filled 2024-01-30: qty 4
  Filled 2024-01-30 (×3): qty 1

## 2024-01-30 MED ORDER — ATORVASTATIN CALCIUM 40 MG PO TABS
40.0000 mg | ORAL_TABLET | Freq: Every day | ORAL | Status: DC
  Administered 2024-01-31 – 2024-02-02 (×3): 40 mg via ORAL
  Filled 2024-01-30 (×3): qty 1

## 2024-01-30 MED ORDER — PANCRELIPASE (LIP-PROT-AMYL) 12000-38000 UNITS PO CPEP: 24000.0000 [IU] | ORAL_CAPSULE | Freq: Two times a day (BID) | ORAL | Status: DC | PRN

## 2024-01-30 MED ORDER — CHLORHEXIDINE GLUCONATE CLOTH 2 % EX PADS
6.0000 | MEDICATED_PAD | Freq: Every day | CUTANEOUS | Status: DC
  Administered 2024-01-31 – 2024-02-02 (×3): 6 via TOPICAL

## 2024-01-30 MED ORDER — CALCIUM GLUCONATE 10 % IV SOLN
1.0000 g | Freq: Once | INTRAVENOUS | Status: AC
Start: 2024-01-30 — End: 2024-01-30
  Administered 2024-01-30: 1 g via INTRAVENOUS
  Filled 2024-01-30: qty 10

## 2024-01-30 MED ORDER — PANTOPRAZOLE SODIUM 40 MG PO TBEC
40.0000 mg | DELAYED_RELEASE_TABLET | Freq: Two times a day (BID) | ORAL | Status: DC
  Administered 2024-01-31 – 2024-02-02 (×5): 40 mg via ORAL
  Filled 2024-01-30 (×5): qty 1

## 2024-01-30 MED ORDER — DEXTROSE 50 % IV SOLN
1.0000 | Freq: Once | INTRAVENOUS | Status: AC
  Administered 2024-01-30: 50 mL via INTRAVENOUS
  Filled 2024-01-30: qty 50

## 2024-01-30 MED ORDER — SEVELAMER CARBONATE 800 MG PO TABS
800.0000 mg | ORAL_TABLET | Freq: Three times a day (TID) | ORAL | Status: DC
  Administered 2024-01-31 – 2024-02-02 (×8): 800 mg via ORAL
  Filled 2024-01-30 (×7): qty 1

## 2024-01-30 MED ORDER — SODIUM ZIRCONIUM CYCLOSILICATE 10 G PO PACK
10.0000 g | PACK | Freq: Once | ORAL | Status: AC
  Administered 2024-01-31: 10 g via ORAL
  Filled 2024-01-30: qty 1

## 2024-01-30 MED ORDER — SODIUM CHLORIDE 0.9% FLUSH
3.0000 mL | Freq: Once | INTRAVENOUS | Status: AC
  Administered 2024-01-30: 3 mL via INTRAVENOUS

## 2024-01-30 MED ORDER — DICLOFENAC SODIUM 1 % EX GEL
4.0000 g | Freq: Four times a day (QID) | CUTANEOUS | Status: DC
  Administered 2024-01-31 – 2024-02-02 (×11): 4 g via TOPICAL
  Filled 2024-01-30: qty 100

## 2024-01-30 NOTE — ED Provider Notes (Signed)
 [place] [facility] EMERGENCY DE[facility]MENT AT [facility] Provider Note   CSN: [id number] Arrival date & time: 01/30/24  [id number]  An emergency department physician performed an initial assessment on this suspected stroke patient at 55.  Patient presents with: Code Stroke   Ashley [name] is a 73 y.o. female who presents to the ED today with altered mental status by ambulance.  Concern for acute CVA, also noted to have begun valacyclovir  for herpes zoster, has history of renal disease with decreased GFR, concern for possible encephalopathy from the valacyclovir .  She was only alert and oriented to person with noted slurred speech and left-sided weakness left-sided facial droop.  No history of stroke and no known deficits per family and normal blood glucose readings per EMS.  Further review of her medical history shows hyperlipidemia, hypertension, ESRD, previous CVA, acute on chronic pancreatitis.   HPI     Prior to Admission medications   Medication Sig Start Date End Date Taking? Authorizing Provider  acetaminophen  (TYLENOL ) 325 MG tablet Take 2 tablets (650 mg total) by mouth every 6 (six) hours as needed for moderate pain (pain score 4-6). 12/24/23   Elnora Ip, MD  albuterol  (VENTOLIN  HFA) 108 (90 Base) MCG/ACT inhaler Inhale 2 puffs into the lungs every 6 (six) hours as needed for wheezing or shortness of breath. 06/20/23   Jillian Buttery, MD  amLODipine  (NORVASC ) 10 MG tablet Take 1 tablet (10 mg total) by mouth daily. Patient taking differently: Take 10 mg by mouth at bedtime. 11/11/16   Phelps, Jazma Y, DO  atorvastatin  (LIPITOR) 40 MG tablet Take 1 tablet (40 mg total) by mouth daily. 01/01/24 12/31/24  Renne Homans, MD  CREON  12000-38000 units CPEP capsule Take 2-4 capsules by mouth See admin instructions. Take 4 capsule by mouth three times a day with meals and take 2 capsule by mouth with snacks 03/02/23   [provider]  diclofenac  Sodium (VOLTAREN ) 1 % GEL  Apply 4 g topically 4 (four) times daily. 01/12/24   Rihner, Emilie, DO  dicyclomine  (BENTYL ) 20 MG tablet Take 1 tablet (20 mg total) by mouth 2 (two) times daily. 01/05/24   Kommor, Madison, MD  Fluticasone-Umeclidin-Vilant (TRELEGY ELLIPTA ) 100-62.5-25 MCG/ACT AEPB Inhale 1 puff into the lungs daily. 10/20/23   Darlean Ozell NOVAK, MD  folic acid -vitamin b complex-vitamin c-selenium-zinc  (DIALYVITE ) 3 MG TABS tablet Take 1 tablet by mouth daily.    [provider]  gabapentin  (NEURONTIN ) 100 MG capsule Take 2 capsules (200 mg total) by mouth at bedtime. 06/20/23   Jillian Buttery, MD  hydrALAZINE  (APRESOLINE ) 25 MG tablet Take 3 tablets (75 mg total) by mouth every 8 (eight) hours. 11/14/20 03/09/24  [place], Eric J, DO  hydrocortisone  cream 1 % Apply to affected area 2 times daily 01/28/24 01/27/25  Rihner, Emilie, DO  ipratropium-albuterol  (DUONEB) 0.5-2.5 (3) MG/3ML SOLN Take 3 mLs by nebulization 3 (three) times daily. Patient taking differently: Take 3 mLs by nebulization every 6 (six) hours as needed (For shortness of breath). 06/20/23   Jillian Buttery, MD  ondansetron  (ZOFRAN -ODT) 4 MG disintegrating tablet Take 1 tablet (4 mg total) by mouth every 8 (eight) hours as needed for nausea or vomiting. 01/05/24   Kommor, Madison, MD  pantoprazole  (PROTONIX ) 40 MG tablet Take 1 tablet (40 mg total) by mouth daily. 12/31/23 03/30/24  Tawkaliyar, Roya, DO  pantoprazole  (PROTONIX ) 40 MG tablet Take 1 tablet (40 mg total) by mouth 2 (two) times daily. 01/12/24 01/11/25  Rihner, Emilie, DO  sevelamer   carbonate (RENVELA ) 800 MG tablet Take 800 mg by mouth 3 (three) times daily with meals. 07/23/20   [provider]  valACYclovir  (VALTREX ) 1000 MG tablet Take 1 tablet (1,000 mg total) by mouth 3 (three) times daily for 7 days. 01/28/24 02/04/24  Shawn Sick, MD    Allergies: Aspirin  and Ibuprofen    Review of Systems  Constitutional:  Positive for fatigue.  Neurological:  Positive for facial asymmetry,  speech difficulty, weakness and headaches.  All other systems reviewed and are negative.   Updated Vital Signs BP (!) 168/73 (BP Location: Right Arm)   Pulse 80   Temp 97.7 F (36.5 C) (Oral)   Resp 13   Ht 5' 2 (1.575 m)   Wt 45.8 kg   SpO2 100%   BMI 18.47 kg/m   Physical Exam Vitals and nursing note reviewed.  Constitutional:      General: She is not in acute distress.    Appearance: Normal appearance.  HENT:     Head: Normocephalic and atraumatic.     Mouth/Throat:     Mouth: Mucous membranes are moist.     Pharynx: Oropharynx is clear.     Comments: Patient is endentulous. Eyes:     General: Lids are normal. Vision grossly intact. Gaze aligned appropriately.     Extraocular Movements: Extraocular movements intact.     Conjunctiva/sclera: Conjunctivae normal.     Pupils: Pupils are equal, round, and reactive to light.  Cardiovascular:     Rate and Rhythm: Normal rate and regular rhythm.     Pulses: Normal pulses.     Heart sounds: Normal heart sounds. No murmur heard.    No friction rub. No gallop.  Pulmonary:     Effort: Pulmonary effort is normal.     Breath sounds: Normal breath sounds.  Abdominal:     General: Abdomen is flat. Bowel sounds are normal.     Palpations: Abdomen is soft.  Musculoskeletal:        General: Normal range of motion.     Cervical back: Normal range of motion and neck supple.     Right lower leg: No edema.     Left lower leg: No edema.  Skin:    General: Skin is warm and dry.     Capillary Refill: Capillary refill takes less than 2 seconds.  Neurological:     General: No focal deficit present.     Mental Status: She is lethargic.     GCS: GCS eye subscore is 4. GCS verbal subscore is 5. GCS motor subscore is 6.     Cranial Nerves: Dysarthria present. No facial asymmetry.     Sensory: Sensation is intact.     Motor: Weakness present.     Comments: Generalized weakness appreciated without focal deficits.  Psychiatric:         Mood and Affect: Mood normal.     (all labs ordered are listed, but only abnormal results are displayed) Labs Reviewed  CBC - Abnormal; Notable for the following components:      Result Value   RDW 18.7 (*)    All other components within normal limits  COMPREHENSIVE METABOLIC PANEL WITH GFR - Abnormal; Notable for the following components:   Sodium 130 (*)    Potassium 6.1 (*)    Chloride 92 (*)    BUN 24 (*)    Creatinine, Ser 7.07 (*)    GFR, Estimated 6 (*)    All other components within normal  limits  I-STAT CHEM 8, ED - Abnormal; Notable for the following components:   Sodium 127 (*)    Potassium 6.4 (*)    Chloride 95 (*)    BUN 34 (*)    Creatinine, Ser 7.30 (*)    Calcium , Ion 0.97 (*)    Hemoglobin 15.6 (*)    All other components within normal limits  CBG MONITORING, ED - Abnormal; Notable for the following components:   Glucose-Capillary 213 (*)    All other components within normal limits  CSF CULTURE W GRAM STAIN  PROTIME-INR  APTT  DIFFERENTIAL  ETHANOL  CSF CELL COUNT WITH DIFFERENTIAL  CSF CELL COUNT WITH DIFFERENTIAL  PROTEIN AND GLUCOSE, CSF  MENINGITIS/ENCEPHALITIS PANEL (CSF)  CBG MONITORING, ED    EKG: None  Radiology: CT ANGIO HEAD NECK W WO CM W PERF (CODE STROKE) Addendum Date: 01/30/2024 ADDENDUM REPORT: 01/30/2024 15:10 ADDENDUM: No emergent large vessel occlusion identified. This result and CTA neck impression #2 called by telephone at the time of interpretation on 01/30/2024 at 3:10 pm to provider MCNEILL [facility] , who verbally acknowledged these results. Electronically Signed   By: Rockey Childs D.O.   On: 01/30/2024 15:10   Result Date: 01/30/2024 CLINICAL DATA:  Provided history: Neuro deficit, acute, stroke suspected. Additional history obtained from electronic MEDICAL RECORD NUMBERSlurred speech, left-sided weakness, left-sided facial droop. EXAM: CT ANGIOGRAPHY HEAD AND NECK CT PERFUSION BRAIN TECHNIQUE: Multidetector CT imaging of the  head and neck was performed using the standard protocol during bolus administration of intravenous contrast. Multiplanar CT image reconstructions and MIPs were obtained to evaluate the vascular anatomy. Carotid stenosis measurements (when applicable) are obtained utilizing NASCET criteria, using the distal internal carotid diameter as the denominator. Multiphase CT imaging of the brain was performed following IV bolus contrast injection. Subsequent parametric perfusion maps were calculated using RAPID software. RADIATION DOSE REDUCTION: This exam was performed according to the departmental dose-optimization program which includes automated exposure control, adjustment of the mA and/or kV according to patient size and/or use of iterative reconstruction technique. CONTRAST:  OMNIPAQUE  IOHEXOL  350 MG/ML SOLN COMPARISON:  Noncontrast head CT performed earlier today 01/30/2024. FINDINGS: CTA NECK FINDINGS Aortic arch: Common origin of the innominate and left common carotid arteries. Atherosclerotic plaque within the aortic arch and proximal major branch vessels of the neck. Streak/beam hardening artifact arising from a dense contrast bolus partially obscures the right subclavian artery. Within this limitation, there is no appreciable hemodynamically significant innominate or proximal subclavian artery stenosis. Right carotid system: CCA and ICA patent within the neck. Atherosclerotic plaque within the proximal ICA resulting in 40% stenosis. Tortuosity of the distal cervical ICA Left carotid system: CCA and ICA patent within the neck without measurable stenosis. Mild atherosclerotic plaque scattered within the CCA, but the carotid bifurcation and within the proximal ICA. Tortuosity of the distal cervical ICA. Vertebral arteries: Patent within neck. Nonstenotic atherosclerotic plaque at the bilateral vertebral artery origins. There is asymmetrically diminished enhancement of the proximal and mid cervical left  vertebral artery of uncertain etiology and clinical significance. Skeleton: Edentulous maxilla. Numerous absent mandibular teeth. Spondylosis at the cervical and visualized upper thoracic levels. No acute fracture or aggressive osseous lesion. Other neck: Subcentimeter nodule within the right thyroid  lobe not meeting consensus criteria for ultrasound follow-up based on size. No follow-up imaging recommended. Reference: J Am Coll Radiol. 2015 Feb;12(2): 143-50. Upper chest: No consolidation within the imaged lung apices. Emphysema. Review of the MIP images confirms the above findings CTA  HEAD FINDINGS Anterior circulation: The intracranial internal carotid arteries are patent. Atherosclerotic plaque bilaterally with no more than mild stenosis. The M1 middle cerebral arteries are patent. No M2 proximal branch occlusion or high-grade proximal stenosis. The anterior cerebral arteries are patent. No intracranial aneurysm is identified. Posterior circulation: The intracranial vertebral arteries are patent. Calcified atherosclerotic plaque within the V4 left vertebral artery resulting in moderate stenosis. The basilar artery is patent. The posterior cerebral arteries are patent. A left posterior communicating artery is present. The right posterior communicating artery is diminutive or absent. Venous sinuses: Contrast timing limits evaluation for open sinus thrombosis. Anatomic variants: As described. Review of the MIP images confirms the above findings CT Brain Perfusion Findings: CBF (<30%) Volume: 0mL Perfusion (Tmax>6.0s) volume: 0mL Mismatch Volume: 0mL Infarction Location:None identified Attempts are being made to reach the ordering provider at this time. IMPRESSION: CTA neck: 1. The common carotid and internal carotid arteries are patent within the neck. Atherosclerotic plaque bilaterally as described. Most notably, atherosclerotic plaque within the proximal right ICA results in 40% stenosis. 2. Vertebral arteries  patent within the neck. Nonstenotic plaque at both vertebral artery origins. There is asymmetrically diminished enhancement of the proximal and mid cervical left vertebral artery of uncertain etiology and clinical significance. 3. Aortic Atherosclerosis (ICD10-I70.0) and Emphysema (ICD10-J43.9). CTA head: 1. No proximal intracranial large vessel occlusion or high-grade proximal arterial stenosis identified. 2. Atherosclerotic plaque within the intracranial ICAs with no more than mild stenosis. CT perfusion head: The perfusion software identifies no core infarct. The perfusion software identifies no hypoperfused parenchyma (utilizing the Tmax>6 seconds threshold). No mismatch volume reported. Electronically Signed: By: Rockey Childs D.O. On: 01/30/2024 14:57   CT HEAD CODE STROKE WO CONTRAST Result Date: 01/30/2024 CLINICAL DATA:  Code stroke. Provided history: Neuro deficit, acute, stroke suspected. EXAM: CT HEAD WITHOUT CONTRAST TECHNIQUE: Contiguous axial images were obtained from the base of the skull through the vertex without intravenous contrast. RADIATION DOSE REDUCTION: This exam was performed according to the departmental dose-optimization program which includes automated exposure control, adjustment of the mA and/or kV according to patient size and/or use of iterative reconstruction technique. COMPARISON:  Head CT 12/22/2023.  Brain MRI 12/22/2023. FINDINGS: Brain: Generalized cerebral atrophy. Patchy and ill-defined hypoattenuation within the cerebral white matter, nonspecific but compatible with advanced chronic small vessel ischemic disease. There is no acute intracranial hemorrhage. No demarcated cortical infarct. No extra-axial fluid collection. No evidence of an intracranial mass. No midline shift. Vascular: No hyperdense vessel.  Atherosclerotic calcifications. Skull: No calvarial fracture or aggressive osseous lesion. Sinuses/Orbits: No mass or acute finding within the imaged orbits. No  significant paranasal sinus disease at the imaged levels. ASPECTS [facility] Stroke Program Early CT Score) - Ganglionic level infarction (caudate, lentiform nuclei, internal capsule, insula, M1-M3 cortex): 7 - Supraganglionic infarction (M4-M6 cortex): 3 Total score (0-10 with 10 being normal): 10 No evidence of an acute intracranial abnormality. These results were communicated to Dr. Michaela at 1:49 pmon 8/23/2025by text page via the [facility] messaging system. IMPRESSION: 1. No evidence of an acute intracranial abnormality. 2. Parenchymal atrophy and chronic small vessel ischemic disease. Electronically Signed   By: Rockey Childs D.O.   On: 01/30/2024 13:49     .Critical Care  Performed by: Myriam Dorn BROCKS, PA Authorized by: Myriam Dorn BROCKS, PA   Critical care provider statement:    Critical care time (minutes):  30   Critical care time was exclusive of:  Separately billable procedures and treating other patients  Critical care was necessary to treat or prevent imminent or life-threatening deterioration of the following conditions:  CNS failure or compromise   Critical care was time spent personally by me on the following activities:  Development of treatment plan with patient or surrogate, discussions with consultants, evaluation of patient's response to treatment, examination of patient, obtaining history from patient or surrogate, ordering and performing treatments and interventions, ordering and review of radiographic studies, ordering and review of laboratory studies, re-evaluation of patient's condition, pulse oximetry and review of old charts Lumbar Puncture  Date/Time: 01/30/2024 4:27 PM  Performed by: Myriam Dorn BROCKS, PA Authorized by: Myriam Dorn BROCKS, PA   Consent:    Consent obtained:  Verbal and written   Consent given by:  Patient   Risks, benefits, and alternatives were discussed: yes     Risks discussed:  Bleeding, infection, pain, headache, nerve damage and  repeat procedure   Alternatives discussed:  No treatment, delayed treatment, alternative treatment, observation and referral Universal protocol:    Procedure explained and questions answered to patient or proxy's satisfaction: yes     Site/side marked: yes     Patient identity confirmed:  Verbally with patient, arm band and hospital-assigned identification number Pre-procedure details:    Procedure purpose:  Diagnostic   Preparation: Patient was prepped and draped in usual sterile fashion   Anesthesia:    Anesthesia method:  Local infiltration   Local anesthetic:  Lidocaine  1% w/o epi Procedure details:    Lumbar space:  L4-L5 interspace   Patient position:  L lateral decubitus   Needle type:  Spinal needle - Quincke tip   Ultrasound guidance: no     Number of attempts:  1   Fluid appearance:  Clear   Tubes of fluid:  4   Total volume (ml):  8 Post-procedure details:    Puncture site:  Direct pressure applied and adhesive bandage applied   Procedure completion:  Tolerated well, no immediate complications    Medications Ordered in the ED  heparin  injection 5,000 Units (5,000 Units Subcutaneous Given 01/30/24 1606)  sodium chloride  flush (NS) 0.9 % injection 3 mL (3 mLs Intravenous Given 01/30/24 1436)  calcium  gluconate inj 10% (1 g) URGENT USE ONLY! (1 g Intravenous Given 01/30/24 1435)  sodium bicarbonate  injection 50 mEq (50 mEq Intravenous Given 01/30/24 1439)  insulin  aspart (novoLOG ) injection 5 Units (5 Units Intravenous Given 01/30/24 1438)    And  dextrose  50 % solution 50 mL (50 mLs Intravenous Given 01/30/24 1439)  iohexol  (OMNIPAQUE ) 350 MG/ML injection 100 mL (100 mLs Intravenous Contrast Given 01/30/24 1431)                                    Medical Decision Making Amount and/or Complexity of Data Reviewed Labs: ordered. Radiology: ordered.  Risk OTC drugs. Prescription drug management. Decision regarding hospitalization.   Medical Decision Making:   ALLYAH HEATHER is a 73 y.o. female who presented to the ED today with decreased mental status detailed above.     Complete initial physical exam performed, notably the patient  was alert and oriented but lethargic.  Slow to respond to answers however follows commands appropriately.    Reviewed and confirmed nursing documentation for past medical history, family history, social history.    Initial Assessment:   With the patient's presentation of decreased mental status, differential does include acute neurovascular insult, electrolyte abnormality,  acute encephalopathy.  Further consider possible hypoglycemia.   Initial Plan:  Obtain blood glucose monitoring to evaluate for hypoglycemia Screening labs including CBC and Metabolic panel to evaluate for infectious or metabolic etiology of disease.  Urinalysis with reflex culture ordered to evaluate for UTI or relevant urologic/nephrologic pathology.  CXR to evaluate for structural/infectious intrathoracic pathology.  Obtain CT of the head without contrast to assess for acute neurovascular insult/intracranial bleed Obtain CT angio of the head and neck to evaluate for acute neurovascular insult and bleed. Consult with neurology regarding potential stroke EKG to evaluate for cardiac pathology Objective evaluation as below reviewed   Initial Study Results:   Laboratory  All laboratory results reviewed without evidence of clinically relevant pathology.   Exceptions include: Profound hyperkalemia at 6.1, hyponatremia at 130, elevated creatinine of 7.07.  EKG EKG was reviewed independently. Rate, rhythm, axis, intervals all examined and without medically relevant abnormality. ST segments without concerns for elevations.    Radiology:  All images reviewed independently. Agree with radiology report at this time.   CT ANGIO HEAD NECK W WO CM W PERF (CODE STROKE) Addendum Date: 01/30/2024 ADDENDUM REPORT: 01/30/2024 15:10 ADDENDUM: No emergent large vessel  occlusion identified. This result and CTA neck impression #2 called by telephone at the time of interpretation on 01/30/2024 at 3:10 pm to provider MCNEILL [facility] , who verbally acknowledged these results. Electronically Signed   By: Rockey Childs D.O.   On: 01/30/2024 15:10   Result Date: 01/30/2024 CLINICAL DATA:  Provided history: Neuro deficit, acute, stroke suspected. Additional history obtained from electronic MEDICAL RECORD NUMBERSlurred speech, left-sided weakness, left-sided facial droop. EXAM: CT ANGIOGRAPHY HEAD AND NECK CT PERFUSION BRAIN TECHNIQUE: Multidetector CT imaging of the head and neck was performed using the standard protocol during bolus administration of intravenous contrast. Multiplanar CT image reconstructions and MIPs were obtained to evaluate the vascular anatomy. Carotid stenosis measurements (when applicable) are obtained utilizing NASCET criteria, using the distal internal carotid diameter as the denominator. Multiphase CT imaging of the brain was performed following IV bolus contrast injection. Subsequent parametric perfusion maps were calculated using RAPID software. RADIATION DOSE REDUCTION: This exam was performed according to the departmental dose-optimization program which includes automated exposure control, adjustment of the mA and/or kV according to patient size and/or use of iterative reconstruction technique. CONTRAST:  OMNIPAQUE  IOHEXOL  350 MG/ML SOLN COMPARISON:  Noncontrast head CT performed earlier today 01/30/2024. FINDINGS: CTA NECK FINDINGS Aortic arch: Common origin of the innominate and left common carotid arteries. Atherosclerotic plaque within the aortic arch and proximal major branch vessels of the neck. Streak/beam hardening artifact arising from a dense contrast bolus partially obscures the right subclavian artery. Within this limitation, there is no appreciable hemodynamically significant innominate or proximal subclavian artery stenosis. Right carotid  system: CCA and ICA patent within the neck. Atherosclerotic plaque within the proximal ICA resulting in 40% stenosis. Tortuosity of the distal cervical ICA Left carotid system: CCA and ICA patent within the neck without measurable stenosis. Mild atherosclerotic plaque scattered within the CCA, but the carotid bifurcation and within the proximal ICA. Tortuosity of the distal cervical ICA. Vertebral arteries: Patent within neck. Nonstenotic atherosclerotic plaque at the bilateral vertebral artery origins. There is asymmetrically diminished enhancement of the proximal and mid cervical left vertebral artery of uncertain etiology and clinical significance. Skeleton: Edentulous maxilla. Numerous absent mandibular teeth. Spondylosis at the cervical and visualized upper thoracic levels. No acute fracture or aggressive osseous lesion. Other neck: Subcentimeter nodule within the  right thyroid  lobe not meeting consensus criteria for ultrasound follow-up based on size. No follow-up imaging recommended. Reference: J Am Coll Radiol. 2015 Feb;12(2): 143-50. Upper chest: No consolidation within the imaged lung apices. Emphysema. Review of the MIP images confirms the above findings CTA HEAD FINDINGS Anterior circulation: The intracranial internal carotid arteries are patent. Atherosclerotic plaque bilaterally with no more than mild stenosis. The M1 middle cerebral arteries are patent. No M2 proximal branch occlusion or high-grade proximal stenosis. The anterior cerebral arteries are patent. No intracranial aneurysm is identified. Posterior circulation: The intracranial vertebral arteries are patent. Calcified atherosclerotic plaque within the V4 left vertebral artery resulting in moderate stenosis. The basilar artery is patent. The posterior cerebral arteries are patent. A left posterior communicating artery is present. The right posterior communicating artery is diminutive or absent. Venous sinuses: Contrast timing limits  evaluation for open sinus thrombosis. Anatomic variants: As described. Review of the MIP images confirms the above findings CT Brain Perfusion Findings: CBF (<30%) Volume: 0mL Perfusion (Tmax>6.0s) volume: 0mL Mismatch Volume: 0mL Infarction Location:None identified Attempts are being made to reach the ordering provider at this time. IMPRESSION: CTA neck: 1. The common carotid and internal carotid arteries are patent within the neck. Atherosclerotic plaque bilaterally as described. Most notably, atherosclerotic plaque within the proximal right ICA results in 40% stenosis. 2. Vertebral arteries patent within the neck. Nonstenotic plaque at both vertebral artery origins. There is asymmetrically diminished enhancement of the proximal and mid cervical left vertebral artery of uncertain etiology and clinical significance. 3. Aortic Atherosclerosis (ICD10-I70.0) and Emphysema (ICD10-J43.9). CTA head: 1. No proximal intracranial large vessel occlusion or high-grade proximal arterial stenosis identified. 2. Atherosclerotic plaque within the intracranial ICAs with no more than mild stenosis. CT perfusion head: The perfusion software identifies no core infarct. The perfusion software identifies no hypoperfused parenchyma (utilizing the Tmax>6 seconds threshold). No mismatch volume reported. Electronically Signed: By: Rockey Childs D.O. On: 01/30/2024 14:57   CT HEAD CODE STROKE WO CONTRAST Result Date: 01/30/2024 CLINICAL DATA:  Code stroke. Provided history: Neuro deficit, acute, stroke suspected. EXAM: CT HEAD WITHOUT CONTRAST TECHNIQUE: Contiguous axial images were obtained from the base of the skull through the vertex without intravenous contrast. RADIATION DOSE REDUCTION: This exam was performed according to the departmental dose-optimization program which includes automated exposure control, adjustment of the mA and/or kV according to patient size and/or use of iterative reconstruction technique. COMPARISON:  Head CT  12/22/2023.  Brain MRI 12/22/2023. FINDINGS: Brain: Generalized cerebral atrophy. Patchy and ill-defined hypoattenuation within the cerebral white matter, nonspecific but compatible with advanced chronic small vessel ischemic disease. There is no acute intracranial hemorrhage. No demarcated cortical infarct. No extra-axial fluid collection. No evidence of an intracranial mass. No midline shift. Vascular: No hyperdense vessel.  Atherosclerotic calcifications. Skull: No calvarial fracture or aggressive osseous lesion. Sinuses/Orbits: No mass or acute finding within the imaged orbits. No significant paranasal sinus disease at the imaged levels. ASPECTS [facility] Stroke Program Early CT Score) - Ganglionic level infarction (caudate, lentiform nuclei, internal capsule, insula, M1-M3 cortex): 7 - Supraganglionic infarction (M4-M6 cortex): 3 Total score (0-10 with 10 being normal): 10 No evidence of an acute intracranial abnormality. These results were communicated to Dr. Michaela at 1:49 pmon 8/23/2025by text page via the [facility] messaging system. IMPRESSION: 1. No evidence of an acute intracranial abnormality. 2. Parenchymal atrophy and chronic small vessel ischemic disease. Electronically Signed   By: Rockey Childs D.O.   On: 01/30/2024 13:49   DG Abd 2  Views Result Date: 01/08/2024 CLINICAL DATA:  Upper abdominal pain.  Weakness. EXAM: ABDOMEN - 2 VIEW COMPARISON:  08/20/2023 FINDINGS: Cardiac enlargement. No vascular congestion or edema. Vascular graft in the left axilla. No focal airspace disease or consolidation in the lungs. No blunting of costophrenic angles. No pneumothorax. Mediastinal contours appear intact. Calcification of the aorta Scattered gas and stool as well as residual contrast material in the colon. No small or large bowel distention. No free intra-abdominal air. No abnormal air-fluid levels. No radiopaque stones. Visualized bones appear intact. IMPRESSION: 1. Cardiac enlargement.  No evidence of  active pulmonary disease. 2. Normal nonobstructive bowel gas pattern. Electronically Signed   By: Elsie Gravely M.D.   On: 01/08/2024 16:17   CT ABDOMEN PELVIS W CONTRAST Result Date: 01/05/2024 CLINICAL DATA:  Acute nonlocalized abdominal pain. Nausea, vomiting, diarrhea. Dialysis yesterday. Lower abdominal pain. EXAM: CT ABDOMEN AND PELVIS WITH CONTRAST TECHNIQUE: Multidetector CT imaging of the abdomen and pelvis was performed using the standard protocol following bolus administration of intravenous contrast. RADIATION DOSE REDUCTION: This exam was performed according to the departmental dose-optimization program which includes automated exposure control, adjustment of the mA and/or kV according to patient size and/or use of iterative reconstruction technique. CONTRAST:  75mL OMNIPAQUE  IOHEXOL  350 MG/ML SOLN COMPARISON:  06/17/2023 FINDINGS: Lower chest: Atelectasis in the lung bases.  Cardiac enlargement. Hepatobiliary: Mild diffuse fatty infiltration of the liver. No focal lesions. Gallbladder and bile ducts are normal. Pancreas: Unremarkable. No pancreatic ductal dilatation or surrounding inflammatory changes. Spleen: Normal in size without focal abnormality. Adrenals/Urinary Tract: Adrenals are enlarged without focal nodularity, likely hyperplasia. Similar appearance to prior study. Bilateral renal atrophy with symmetrical appearance. Subcentimeter cysts in both kidneys. No imaging follow-up is indicated. No hydronephrosis or hydroureter. Bladder is decompressed. Stomach/Bowel: Stomach, small bowel, and colon are not abnormally distended. Small bowel are diffusely fluid-filled with mild wall thickening. Decompression of the colon limits evaluation but there is suggestion of possible colonic wall thickening and stranding. Changes are most likely to represent enterocolitis. Appendix is normal. Vascular/Lymphatic: Prominent diffuse calcification throughout the abdominal aorta and common iliac arteries. No  aneurysm. No significant lymphadenopathy. Reproductive: Status post hysterectomy. No adnexal masses. Other: No abdominal wall hernia or abnormality. No abdominopelvic ascites. Musculoskeletal: Degenerative changes in the spine. No acute bony abnormalities. IMPRESSION: 1. Diffusely fluid-filled nondistended small bowel with mild wall thickening. Under distended colon limits evaluation but suggestion of wall thickening and pericolonic stranding. Changes likely to represent enterocolitis. 2. Atrophic kidneys bilaterally. 3. Severe aortic atherosclerosis. 4. Mild diffuse fatty infiltration of the liver. Electronically Signed   By: Elsie Gravely M.D.   On: 01/05/2024 17:42      Consults: Case discussed with Dr. Michaela with neurology as well as with hospitalist team.   Reassessment and Plan:   Given her presentation, discussed case with neurology who does not believe that based on the imaging findings that she is consistent with acute neurovascular insult at this time.  Differential still continues to be acute encephalopathy secondary to valacyclovir  as well as potential herpes zoster encephalopathy.  As a result, requested to perform lumbar puncture as noted.  Consulted with hospitalist team who agrees to accept this patient for admission given her current condition, Dr. Rosan with internal medicine agrees to accept for continued neurologic workup.       Final diagnoses:  Acute encephalopathy    ED Discharge Orders     None          Myriam Carrier  C, PA 01/30/24 1638    Charlyn Sora, MD 01/31/24 [phone]

## 2024-01-30 NOTE — Consult Note (Signed)
 NEUROLOGY CONSULT NOTE   Date of service: January 30, 2024 Patient Name: Ashley [name] MRN:  [id number] DOB:  [birth year] Chief Complaint: Slurred speech Requesting Provider: Rosan Dayton BROCKS, DO  History of Present Illness  Ashley [name] is a 73 y.o. female with hx of previous stroke who presents with slurred speech.  She states that she felt abnormal on awakening, but when her daughter talk to her around 1030 she was slumped to the side but still speaking relatively clearly.  Subsequently she was found to have more slurred speech and therefore EMS was called.  LKW: 8/22 prior to bed IV Thrombolysis: No, outside window EVT: No, no LVO NIH stroke scale: 9 Past History   Past Medical History:  Diagnosis Date   Acute pancreatitis 2000   2000, 12/2018, 08/2019   Arthritis    Cervical radiculopathy 02/28/2011   Cocaine substance abuse (HCC) 05/26/2013   positive UDS    COPD (chronic obstructive pulmonary disease) (HCC)    COPD (chronic obstructive pulmonary disease) (HCC) 12/22/2023   Duodenitis    Dyspnea    Erosive gastropathy    ESRD on hemodialysis (HCC)    TTS   GERD (gastroesophageal reflux disease)    Hepatitis C 1987   dt hx IVDA.  genotype 2B.  Epclusa  started early 04/2020.     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  Chronic microvascular ischemic changes w parenchymal volume loss. Chronic white matter periventricular microhemorrhage, likely due to htn   Ulcer 1990   gastric ulcer. Ruptured s/p emergency repair    Past Surgical History:  Procedure Laterality Date   ABDOMINAL HYSTERECTOMY  1979   AV FISTULA PLACEMENT Left 06/16/2017   Procedure: ARTERIOVENOUS (AV) FISTULA CREATION LEFT ARM;  Surgeon: Laurence Redell CROME,  MD;  Location: [facility] OR;  Service: Vascular;  Laterality: Left;   BASCILIC VEIN TRANSPOSITION Left 10/02/2017   Procedure: BASILIC VEIN TRANSPOSITION SECOND STAGE LEFT ARM;  Surgeon: Oris Krystal FALCON, MD;  Location: [facility] OR;  Service: Vascular;  Laterality: Left;   BIOPSY  09/06/2019   Procedure: BIOPSY;  Surgeon: Avram Lupita FORBES, MD;  Location[facility] END[facility]COPY;  Service: Endoscopy;;   BIOPSY  03/13/2023   Procedure: BIOPSY;  Surgeon: San Sandor GAILS, DO;  Location: [facility] ENDOSCOPY;  Service: Gastroenterology;;   COLONOSCOPY WITH PROPOFOL  N/A 03/13/2023   Procedure: COLONOSCOPY WITH PROPOFOL ;  Surgeon: San Sandor GAILS, DO;  Location: [facility] ENDOSCOPY;  Service: Gastroenterology;  Laterality: N/A;   ENTEROSCOPY N/A 10/22/2022   Procedure: ENTEROSCOPY;  Surgeon: Legrand Victory CROME DOUGLAS, MD;  Location: [facility] ENDOSCOPY;  Service: Gastroenterology;  Laterality: N/A;   ESOPHAGOGASTRODUODENOSCOPY N/A 05/29/2013   Procedure: ESOPHAGOGASTRODUODENOSCOPY (EGD);  Surgeon: Gordy CHRISTELLA Starch, MD;  Location: [facility] ENDOSCOPY;  Service: Endoscopy;  Laterality: N/A;   ESOPHAGOGASTRODUODENOSCOPY  05/2013   for epigastric pain.  Nonobstructing Schatzki ring at GEJ, mild gastropathy, nonbleeding AVMs in bulb and D2. 5 mm sessile polyp in bulb.   ESOPHAGOGASTRODUODENOSCOPY (EGD) WITH PROPOFOL  N/A 09/06/2019   Procedure: ESOPHAGOGASTRODUODENOSCOPY (EGD) WITH PROPOFOL ;  Surgeon: Avram Lupita FORBES, MD;  Location: [facility] ENDOSCOPY;  Service: Endoscopy;  Laterality: N/A;   ESOPHAGOGASTRODUODENOSCOPY (EGD) WITH PROPOFOL  N/A 02/02/2020   Procedure: ESOPHAGOGASTRODUODENOSCOPY (EGD) WITH PROPOFOL ;  Surgeon:  Teressa Toribio SQUIBB, MD;  Location: [facility] ENDOSCOPY;  Service: Endoscopy;  Laterality: N/A;   ESOPHAGOGASTRODUODENOSCOPY (EGD) WITH PROPOFOL  N/A 02/07/2021   Procedure: ESOPHAGOGASTRODUODENOSCOPY (EGD) WITH PROPOFOL ;  Surgeon: Teressa Toribio SQUIBB, MD;  Location: [facility] ENDOSCOPY;  Service: Endoscopy;  Laterality: N/A;   ESOPHAGOGASTRODUODENOSCOPY (EGD) WITH PROPOFOL   N/A 03/13/2023   Procedure: ESOPHAGOGASTRODUODENOSCOPY (EGD) WITH PROPOFOL ;  Surgeon: San Sandor GAILS, DO;  Location: [facility] ENDOSCOPY;  Service: Gastroenterology;  Laterality: N/A;   EUS N/A 02/02/2020   Procedure: UPPER ENDOSCOPIC ULTRASOUND (EUS) RADIAL;  Surgeon: Teressa Toribio SQUIBB, MD;  Location: [facility] ENDOSCOPY;  Service: Endoscopy;  Laterality: N/A;   EUS N/A 08/20/2023   Procedure: UPPER ENDOSCOPIC ULTRASOUND (EUS) RADIAL;  Surgeon: Wilhelmenia Aloha Raddle., MD;  Location: [facility] ENDOSCOPY;  Service: Gastroenterology;  Laterality: N/A;   EXCHANGE OF A DIALYSIS CATHETER Left 07/31/2017   Procedure: Removal  OF A  Right GroinTUNNELED  DIALYSIS CATHETER ,  Insertion of Left Femoral Dialysis Catheter.;  Surgeon: Oris Krystal FALCON, MD;  Location: [facility] OR;  Service: Vascular;  Laterality: Left;   FISTULOGRAM Left 11/19/2023   Procedure: FISTULOGRAM WITH ANGIOPLASTY OF FISTULA;  Surgeon: Magda Debby SAILOR, MD;  Location: [facility] OR;  Service: Vascular;  Laterality: Left;   HEMOSTASIS CLIP PLACEMENT  02/07/2021   Procedure: HEMOSTASIS CLIP PLACEMENT;  Surgeon: Teressa Toribio SQUIBB, MD;  Location: [facility] ENDOSCOPY;  Service: Endoscopy;;   HEMOSTASIS CLIP PLACEMENT  10/22/2022   Procedure: HEMOSTASIS CLIP PLACEMENT;  Surgeon: Legrand Victory LITTIE DOUGLAS, MD;  Location: [facility] ENDOSCOPY;  Service: Gastroenterology;;   HEMOSTASIS CLIP PLACEMENT  03/13/2023   Procedure: HEMOSTASIS CLIP PLACEMENT;  Surgeon: San Sandor GAILS, DO;  Location: [facility] ENDOSCOPY;  Service: Gastroenterology;;   HEMOSTASIS CLIP PLACEMENT  08/20/2023   Procedure: CONTROL OF HEMORRHAGE, GI TRACT, ENDOSCOPIC, BY CLIPPING OR OVERSEWING;  Surgeon: Wilhelmenia Aloha Raddle., MD;  Location: [facility] ENDOSCOPY;  Service: Gastroenterology;;   HOT HEMOSTASIS N/A 09/06/2019   Procedure: HOT HEMOSTASIS (ARGON PLASMA COAGULATION/BICAP);  Surgeon: Avram Lupita BRAVO, MD;  Location: [facility] ENDOSCOPY;  Service: Endoscopy;  Laterality: N/A;   HOT HEMOSTASIS N/A 02/07/2021   Procedure: HOT HEMOSTASIS  (ARGON PLASMA COAGULATION/BICAP);  Surgeon: Teressa Toribio SQUIBB, MD;  Location: [facility] ENDOSCOPY;  Service: Endoscopy;  Laterality: N/A;   HOT HEMOSTASIS N/A 10/22/2022   Procedure: HOT HEMOSTASIS (ARGON PLASMA COAGULATION/BICAP);  Surgeon: Legrand Victory LITTIE DOUGLAS, MD;  Location: [facility] ENDOSCOPY;  Service: Gastroenterology;  Laterality: N/A;   HOT HEMOSTASIS N/A 08/20/2023   Procedure: EGD, WITH ARGON PLASMA COAGULATION;  Surgeon: Wilhelmenia Aloha Raddle., MD;  Location: [facility] ENDOSCOPY;  Service: Gastroenterology;  Laterality: N/A;   INSERTION OF DIALYSIS CATHETER Right 06/16/2017   Procedure: INSERTION OF DIALYSIS CATHETER;  Surgeon: Laurence Redell LITTIE, MD;  Location: [facility] OR;  Service: Vascular;  Laterality: Right;   IR AV DIALY SHUNT INTRO NEEDLE/INTRACATH INITIAL W/PTA/IMG LEFT  06/21/2018   POLYPECTOMY  03/13/2023   Procedure: POLYPECTOMY;  Surgeon: San Sandor GAILS, DO;  Location: [facility] ENDOSCOPY;  Service: Gastroenterology;;   POLYPECTOMY  08/20/2023   Procedure: POLYPECTOMY;  Surgeon: Wilhelmenia Aloha Raddle., MD;  Location: [facility] ENDOSCOPY;  Service: Gastroenterology;;   REPAIR OF PERFORATED ULCER  1990   gastric ulcer   SCLEROTHERAPY  10/22/2022   Procedure: MATIAS;  Surgeon: Legrand Victory LITTIE DOUGLAS, MD;  Location: [facility] ENDOSCOPY;  Service: Gastroenterology;;   SUBMUCOSAL TATTOO INJECTION  10/22/2022   Procedure: SUBMUCOSAL TATTOO INJECTION;  Surgeon: Legrand Victory LITTIE DOUGLAS, MD;  Location: [facility] ENDOSCOPY;  Service: Gastroenterology;;    Family History: Family  History  Problem Relation Age of Onset   Hypertension Father    Cancer Father    Hyperlipidemia Father    Seizures Sister    Early death Daughter    Kidney disease Daughter        end stage dialysis dependent    Breast cancer Maternal Aunt     Social History  reports that she has been smoking cigarettes. She has a 10 pack-year smoking history. She has been exposed to tobacco smoke. She has never used smokeless tobacco. She reports that she does not  currently use drugs after having used the following drugs: Heroin, Marijuana, and Cocaine. She reports that she does not drink alcohol.  Allergies  Allergen Reactions   Aspirin  Nausea And Vomiting    Stomach ache   Ibuprofen Nausea And Vomiting    Stomach ache    Medications   Current Facility-Administered Medications:    albuterol  (PROVENTIL ) (2.5 MG/3ML) 0.083% nebulizer solution 2.5 mg, 2.5 mg, Nebulization, Q6H PRN, Rosan, Erik C, DO   amLODipine  (NORVASC ) tablet 10 mg, 10 mg, Oral, QHS, Amilibia, Jaden, DO   atorvastatin  (LIPITOR) tablet 40 mg, 40 mg, Oral, Daily, Amilibia, Jaden, DO   budesonide -glycopyrrolate -formoterol  (BREZTRI ) 160-9-4.8 MCG/ACT inhaler 2 puff, 2 puff, Inhalation, BID, Amilibia, Jaden, DO   [START ON 01/31/2024] Chlorhexidine  Gluconate Cloth 2 % PADS 6 each, 6 each, Topical, Q0600, Geralynn Charleston, MD   diclofenac  Sodium (VOLTAREN ) 1 % topical gel 4 g, 4 g, Topical, QID, Amilibia, Jaden, DO   heparin  injection 5,000 Units, 5,000 Units, Subcutaneous, Q8H, Jolaine Pac, DO, 5,000 Units at 01/30/24 1606   hydrALAZINE  (APRESOLINE ) tablet 75 mg, 75 mg, Oral, Q8H, Amilibia, Jaden, DO   lipase/protease/amylase (CREON ) capsule 24,000 Units, 24,000 Units, Oral, BID BM PRN, Rosan Dayton BROCKS, DO   lipase/protease/amylase (CREON ) capsule 48,000 Units, 48,000 Units, Oral, TID with meals, Amilibia, Jaden, DO   multivitamin (RENA-VIT) tablet 1 tablet, 1 tablet, Oral, QHS, Hoffman, Erik C, DO   pantoprazole  (PROTONIX ) EC tablet 40 mg, 40 mg, Oral, BID, Amilibia, Jaden, DO   [START ON 01/31/2024] sevelamer  carbonate (RENVELA ) tablet 800 mg, 800 mg, Oral, TID WC, Amilibia, Jaden, DO   sodium zirconium cyclosilicate  (LOKELMA ) packet 10 g, 10 g, Oral, Once, Jolaine Pac, DO  Vitals   Vitals:   01/30/24 1530 01/30/24 1600 01/30/24 1608 01/30/24 1700  BP: (!) 180/77 (!) 168/73  (!) 165/55  Pulse:  80  81  Resp: 16 13  15   Temp:   97.7 F (36.5 C) 98.4 F (36.9 C)   TempSrc:   Oral Oral  SpO2:  100%  98%  Weight:      Height:        Body mass index is 18.47 kg/m.   Physical Exam   Constitutional: Appears elderly  Neurologic Examination    Neuro: Mental Status: Patient is awake, alert, she speaks with a very soft voice, she is dysarthric but does not appear aphasic.  She is dysarthric Cranial Nerves: II: Visual Fields are full.  III,IV, VI: EOMI without ptosis or diploplia.  V: Facial sensation is symmetric to temperature VII: Facial movement is symmetric.  VIII: hearing is intact to voice X: Uvula elevates symmetrically XII: tongue is midline without atrophy or fasciculations.  Motor: She has drift in bilateral upper extremities, unable to lift against gravity in bilateral lower extremities Sensory: Sensation is symmetric to light touch and temperature in the arms and legs. Cerebellar: No clear ataxia       Labs/Imaging/Neurodiagnostic  studies   CBC:  Recent Labs  Lab 02/07/2024 1343 2024/02/07 1408  WBC  --  4.7  NEUTROABS  --  2.2  HGB 15.6* 12.9  HCT 46.0 41.0  MCV  --  90.5  PLT  --  224   Basic Metabolic Panel:  Lab Results  Component Value Date   NA 130 (L) 2024-02-07   K 6.1 (H) Feb 07, 2024   CO2 24 2024/02/07   GLUCOSE 91 2024-02-07   BUN 24 (H) 2024-02-07   CREATININE 7.07 (H) February 07, 2024   CALCIUM  9.5 02-07-2024   GFRNONAA 6 (L) 2024/02/07   GFRAA 8 (L) 01/11/2020   Lipid Panel:  Lab Results  Component Value Date   LDLCALC 78 12/31/2023   HgbA1c:  Lab Results  Component Value Date   HGBA1C 5.1 06/14/2017   Urine Drug Screen:     Component Value Date/Time   LABOPIA POSITIVE (A) 09/06/2019 2340   COCAINSCRNUR NONE DETECTED 09/06/2019 2340   COCAINSCRNUR NEG 11/28/2013 1704   LABBENZ NONE DETECTED 09/06/2019 2340   LABBENZ NEG 11/28/2013 1704   AMPHETMU NONE DETECTED 09/06/2019 2340   THCU NONE DETECTED 09/06/2019 2340   LABBARB NONE DETECTED 09/06/2019 2340    Alcohol Level      Component Value Date/Time   ETH <15 07-Feb-2024 1409   INR  Lab Results  Component Value Date   INR 1.0 02-07-2024   APTT  Lab Results  Component Value Date   APTT 36 2024-02-07   CT/CTA-no acute LVO or acute findings on CT, there is less opacification in the left vert, but it does fill.   ASSESSMENT   Ashley [name] is a 73 y.o. female with a history of previous stroke who presents with worsening confusion and dysarthria.  I do wonder about possible acyclovir related neurotoxicity given that she was started on valacyclovir  1 g 3 times daily in the setting of ESRD.  With acute presentations of confusion in the setting of recent shingles outbreak, I do think an LP is indicated to assess for VZV encephalitis.  If CSF PCR is negative, I would favor holding off on further acyclovir.  Repeat MRI to assess for stroke would also be prudent my suspicion is low.  RECOMMENDATIONS  MRI brain MRA neck (to investigate CTA finding) LP for cells, protein, glucose, meningoencephalitis panel Ammonia, TSH, B12 Hold acyclovir Neurology will follow ______________________________________________________________________    Signed, Aisha Seals, MD [facility]

## 2024-01-30 NOTE — Procedures (Signed)
 HD Note:  Some information was entered later than the data was gathered due to patient care needs. The stated time with the data is accurate.  Received patient in bed to unit.   Alert and oriented to self. Patient's speech is slurred and difficult to understand.  Patient is pleasant.  Informed consent signed and in chart.   Access used: Left upper arm fistula Access issues: None  Patient attempted to sip coffee and began coughing with the first sip.  No more liquids were offered.  Patient did have the behavior of reaching into the air with her access arm (L) seeming to reach for something.  She was unable to say what she was reaching for when asked. She also thought she saw her granddaughter and kept yelling out to her.  Patient became increasingly restless as the treatment progressed.  Access arm restrained for the last 1.5 hours with constant supervision from this Clinical research associate.  BFR had to be reduced to 300 for increasing venous pressure for a period of time,  see flowsheet.  This Clinical research associate held her hand with patient for last hour of treatment   TX duration: 3 hours  Alert, without acute distress.  Total UF removed: 2000 ml  Hand-off given to patient's nurse.   Transported back to the room   Cassiel Fernandez L. Lenon, RN Kidney Dialysis Unit.

## 2024-01-30 NOTE — Consult Note (Addendum)
 Renal Service Consult Note [facility] Ashley JONETTA Fret, MD  Patient: Ashley [name] Date: 01/30/2024 Requesting Physician: Dr. Rosan  Reason for Consult: ESRD pt w/ code stroke HPI: The patient is a 73 y.o. year-old w/ PMH as below who presented to ED this afternoon for code stroke.  Patient was oriented to person only and had slurred speech, left-sided weakness, left-sided facial droop and unable to hold up either leg.  History of prior stroke with known deficits per the family.  Prior history of HTN, ESRD on HD, COPD, HL, and CVA.  Neurology assessed the patient and CTA of head and neck was negative.  Patient had recently been started on valacyclovir  2 days prior for suspected shingles on the left face and neck.  In the ED the blood pressure was 170/70, heart rate 83, RR 18, temp 98.4, 100% O2 sat on 2 L nasal cannula.  Potassium 6.1 sodium 130, BUN 24, creatinine 7.0, WBC 4K, hemoglobin 12.9.  Albumin  3.5.  CT head showed no acute changes.  In ED patient received IV calcium  gluconate, insulin , sodium bicarb for hyperkalemia.  Internal medicine team was asked to admit the patient.  We are asked to see for ESRD.   Pt seen normal in hospital room.  Patient memory is very poor but she is able to answer simple questions and follow simple commands.  She denies any chest pain or shortness of breath.   ROS - denies CP, no joint pain, no HA, no blurry vision, no rash, no diarrhea, no nausea/ vomiting   Past Medical History  Past Medical History:  Diagnosis Date   Acute pancreatitis 2000   2000, 12/2018, 08/2019   Arthritis    Cervical radiculopathy 02/28/2011   Cocaine substance abuse (HCC) 05/26/2013   positive UDS    COPD (chronic obstructive pulmonary disease) (HCC)    COPD (chronic obstructive pulmonary disease) (HCC) 12/22/2023   Duodenitis    Dyspnea    Erosive gastropathy    ESRD on hemodialysis (HCC)    TTS   GERD (gastroesophageal reflux disease)    Hepatitis C  1987   dt hx IVDA.  genotype 2B.  Epclusa  started early 04/2020.     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  Chronic microvascular ischemic changes w parenchymal volume loss. Chronic white matter periventricular microhemorrhage, likely due to htn   Ulcer 1990   gastric ulcer. Ruptured s/p emergency repair   Past Surgical History  Past Surgical History:  Procedure Laterality Date   ABDOMINAL HYSTERECTOMY  1979   AV FISTULA PLACEMENT Left 06/16/2017   Procedure: ARTERIOVENOUS (AV) FISTULA CREATION LEFT ARM;  Surgeon: Laurence Redell CROME, MD;  Location: [facility] OR;  Service: Vascular;  Laterality: Left;   BASCILIC VEIN TRANSPOSITION Left 10/02/2017   Procedure: BASILIC VEIN TRANSPOSITION SECOND STAGE LEFT ARM;  Surgeon: Oris Krystal FALCON, MD;  Location: [facility] OR;  Service: Vascular;  Laterality: Left;   BIOPSY  09/06/2019   Procedure: BIOPSY;  Surgeon: Avram Lupita FORBES, MD;  Location: [facility] ENDOSCOPY;  Service: Endoscopy;;   BIOPSY  03/13/2023   Procedure: BIOPSY;  Surgeon: San Sandor GAILS, DO;  Location: [facility] ENDOSCOPY;  Service: Gastroenterology;;   COLONOSCOPY WITH PROPOFOL  N/A 03/13/2023  Procedure: COLONOSCOPY WITH PROPOFOL ;  Surgeon: San Sandor GAILS, DO;  Location: [facility] ENDOSCOPY;  Service: Gastroenterology;  Laterality: N/A;   ENTEROSCOPY N/A 10/22/2022   Procedure: ENTEROSCOPY;  Surgeon: Legrand Victory LITTIE DOUGLAS, MD;  Location: [facility] ENDOSCOPY;  Service: Gastroenterology;  Laterality: N/A;   ESOPHAGOGASTRODUODENOSCOPY N/A 05/29/2013   Procedure: ESOPHAGOGASTRODUODENOSCOPY (EGD);  Surgeon: Gordy CHRISTELLA Starch, MD;  Location: [facility] ENDOSCOPY;  Service: Endoscopy;  Laterality: N/A;   ESOPHAGOGASTRODUODENOSCOPY  05/2013   for epigastric pain.  Nonobstructing Schatzki  ring at GEJ, mild gastropathy, nonbleeding AVMs in bulb and D2. 5 mm sessile polyp in bulb.   ESOPHAGOGASTRODUODENOSCOPY (EGD) WITH PROPOFOL  N/A 09/06/2019   Procedure: ESOPHAGOGASTRODUODENOSCOPY (EGD) WITH PROPOFOL ;  Surgeon: Avram Lupita BRAVO, MD;  Location: [facility] ENDOSCOPY;  Service: Endoscopy;  Laterality: N/A;   ESOPHAGOGASTRODUODENOSCOPY (EGD) WITH PROPOFOL  N/A 02/02/2020   Procedure: ESOPHAGOGASTRODUODENOSCOPY (EGD) WITH PROPOFOL ;  Surgeon: Teressa Toribio SQUIBB, MD;  Location: [facility] ENDOSCOPY;  Service: Endoscopy;  Laterality: N/A;   ESOPHAGOGASTRODUODENOSCOPY (EGD) WITH PROPOFOL  N/A 02/07/2021   Procedure: ESOPHAGOGASTRODUODENOSCOPY (EGD) WITH PROPOFOL ;  Surgeon: Teressa Toribio SQUIBB, MD;  Location[facility] ENDOSCOPY;  Service: Endoscopy;  Laterality: N/A;   ESOPHAGOGASTRODUODENOSCOPY (EGD) WITH PROPOFOL  N/A 03/13/2023   Procedure: ESOPHAGOGASTRODUODENOSCOPY (EGD) WITH PROPOFOL ;  Surgeon: San Sandor GAILS, DO;  Location: [facility] ENDOSCOPY;  Service: Gastroenterology;  Laterality: N/A;   EUS N/A 02/02/2020   Procedure: UPPER ENDOSCOPIC ULTRASOUND (EUS) RADIAL;  Surgeon: Teressa Toribio SQUIBB, MD;  Location: [facility] ENDOSCOPY;  Service: Endoscopy;  Laterality: N/A;   EUS N/A 08/20/2023   Procedure: UPPER ENDOSCOPIC ULTRASOUND (EUS) RADIAL;  Surgeon: Wilhelmenia Aloha Raddle., MD;  Location: [facility] ENDOSCOPY;  Service: Gastroenterology;  Laterality: N/A;   EXCHANGE OF A DIALYSIS CATHETER Left 07/31/2017   Procedure: Removal  OF A  Right GroinTUNNELED  DIALYSIS CATHETER ,  Insertion of Left Femoral Dialysis Catheter.;  Surgeon: Oris Krystal FALCON, MD;  Location: [facility] OR;  Service: Vascular;  Laterality: Left;   FISTULOGRAM Left 11/19/2023   Procedure: FISTULOGRAM WITH ANGIOPLASTY OF FISTULA;  Surgeon: Magda Debby SAILOR, MD;  Location: [facility] OR;  Service: Vascular;  Laterality: Left;   HEMOSTASIS CLIP PLACEMENT  02/07/2021   Procedure: HEMOSTASIS CLIP PLACEMENT;  Surgeon: Teressa Toribio SQUIBB, MD;  Location: [facility] ENDOSCOPY;  Service: Endoscopy;;    HEMOSTASIS CLIP PLACEMENT  10/22/2022   Procedure: HEMOSTASIS CLIP PLACEMENT;  Surgeon: Legrand Victory LITTIE DOUGLAS, MD;  Location: [facility] ENDOSCOPY;  Service: Gastroenterology;;   HEMOSTASIS CLIP PLACEMENT  03/13/2023   Procedure: HEMOSTASIS CLIP PLACEMENT;  Surgeon: San Sandor GAILS, DO;  Location: [facility] ENDOSCOPY;  Service: Gastroenterology;;   HEMOSTASIS CLIP PLACEMENT  08/20/2023   Procedure: CONTROL OF HEMORRHAGE, GI TRACT, ENDOSCOPIC, BY CLIPPING OR OVERSEWING;  Surgeon: Wilhelmenia Aloha Raddle., MD;  Location: [facility] ENDOSCOPY;  Service: Gastroenterology;;   HOT HEMOSTASIS N/A 09/06/2019   Procedure: HOT HEMOSTASIS (ARGON PLASMA COAGULATION/BICAP);  Surgeon: Avram Lupita BRAVO, MD;  Location: [facility] ENDOSCOPY;  Service: Endoscopy;  Laterality: N/A;   HOT HEMOSTASIS N/A 02/07/2021   Procedure: HOT HEMOSTASIS (ARGON PLASMA COAGULATION/BICAP);  Surgeon: Teressa Toribio SQUIBB, MD;  Location: [facility] ENDOSCOPY;  Service: Endoscopy;  Laterality: N/A;   HOT HEMOSTASIS N/A 10/22/2022   Procedure: HOT HEMOSTASIS (ARGON PLASMA COAGULATION/BICAP);  Surgeon: Legrand Victory LITTIE DOUGLAS, MD;  Location: [facility] ENDOSCOPY;  Service: Gastroenterology;  Laterality: N/A;   HOT HEMOSTASIS N/A 08/20/2023   Procedure: EGD, WITH ARGON PLASMA COAGULATION;  Surgeon: Wilhelmenia Aloha Raddle., MD;  Location: [facility] ENDOSCOPY;  Service: Gastroenterology;  Laterality: N/A;   INSERTION OF DIALYSIS CATHETER  Right 06/16/2017   Procedure: INSERTION OF DIALYSIS CATHETER;  Surgeon: Laurence Redell CROME, MD;  Location: [facility] OR;  Service: Vascular;  Laterality: Right;   IR AV DIALY SHUNT INTRO NEEDLE/INTRACATH INITIAL W/PTA/IMG LEFT  06/21/2018   POLYPECTOMY  03/13/2023   Procedure: POLYPECTOMY;  Surgeon: San Sandor GAILS, DO;  Location: [facility] ENDOSCOPY;  Service: Gastroenterology;;   POLYPECTOMY  08/20/2023   Procedure: POLYPECTOMY;  Surgeon: Wilhelmenia Aloha Raddle., MD;  Location: [facility] ENDOSCOPY;  Service: Gastroenterology;;   REPAIR OF PERFORATED ULCER  1990   gastric ulcer    SCLEROTHERAPY  10/22/2022   Procedure: MATIAS;  Surgeon: Legrand Victory CROME DOUGLAS, MD;  Location: [facility] ENDOSCOPY;  Service: Gastroenterology;;   SUBMUCOSAL TATTOO INJECTION  10/22/2022   Procedure: SUBMUCOSAL TATTOO INJECTION;  Surgeon: Legrand Victory CROME DOUGLAS, MD;  Location: [facility] ENDOSCOPY;  Service: Gastroenterology;;   Family History  Family History  Problem Relation Age of Onset   Hypertension Father    Cancer Father    Hyperlipidemia Father    Seizures Sister    Early death Daughter    Kidney disease Daughter        end stage dialysis dependent    Breast cancer Maternal Aunt    Social History  reports that she has been smoking cigarettes. She has a 10 pack-year smoking history. She has been exposed to tobacco smoke. She has never used smokeless tobacco. She reports that she does not currently use drugs after having used the following drugs: Heroin, Marijuana, and Cocaine. She reports that she does not drink alcohol. Allergies  Allergies  Allergen Reactions   Aspirin  Nausea And Vomiting    Stomach ache   Ibuprofen Nausea And Vomiting    Stomach ache   Home medications Prior to Admission medications   Medication Sig Start Date End Date Taking? Authorizing Provider  acetaminophen  (TYLENOL ) 325 MG tablet Take 2 tablets (650 mg total) by mouth every 6 (six) hours as needed for moderate pain (pain score 4-6). 12/24/23   Elnora Ip, MD  albuterol  (VENTOLIN  HFA) 108 (90 Base) MCG/ACT inhaler Inhale 2 puffs into the lungs every 6 (six) hours as needed for wheezing or shortness of breath. 06/20/23   Jillian Buttery, MD  amLODipine  (NORVASC ) 10 MG tablet Take 1 tablet (10 mg total) by mouth daily. Patient taking differently: Take 10 mg by mouth at bedtime. 11/11/16   Phelps, Jazma Y, DO  atorvastatin  (LIPITOR) 40 MG tablet Take 1 tablet (40 mg total) by mouth daily. 01/01/24 12/31/24  Renne Homans, MD  CREON  12000-38000 units CPEP capsule Take 2-4 capsules by mouth See admin instructions.  Take 4 capsule by mouth three times a day with meals and take 2 capsule by mouth with snacks 03/02/23   [provider]  diclofenac  Sodium (VOLTAREN ) 1 % GEL Apply 4 g topically 4 (four) times daily. 01/12/24   Rihner, Emilie, DO  dicyclomine  (BENTYL ) 20 MG tablet Take 1 tablet (20 mg total) by mouth 2 (two) times daily. 01/05/24   Kommor, Madison, MD  Fluticasone-Umeclidin-Vilant (TRELEGY ELLIPTA ) 100-62.5-25 MCG/ACT AEPB Inhale 1 puff into the lungs daily. 10/20/23   Darlean Ozell NOVAK, MD  folic acid -vitamin b complex-vitamin c-selenium-zinc  (DIALYVITE ) 3 MG TABS tablet Take 1 tablet by mouth daily.    [provider]  gabapentin  (NEURONTIN ) 100 MG capsule Take 2 capsules (200 mg total) by mouth at bedtime. 06/20/23   Jillian Buttery, MD  hydrALAZINE  (APRESOLINE ) 25 MG tablet Take 3 tablets (75 mg total) by mouth every 8 (eight)  hours. 11/14/20 03/09/24  [place], Eric J, DO  hydrocortisone  cream 1 % Apply to affected area 2 times daily 01/28/24 01/27/25  Rihner, Emilie, DO  ipratropium-albuterol  (DUONEB) 0.5-2.5 (3) MG/3ML SOLN Take 3 mLs by nebulization 3 (three) times daily. Patient taking differently: Take 3 mLs by nebulization every 6 (six) hours as needed (For shortness of breath). 06/20/23   Jillian Buttery, MD  ondansetron  (ZOFRAN -ODT) 4 MG disintegrating tablet Take 1 tablet (4 mg total) by mouth every 8 (eight) hours as needed for nausea or vomiting. 01/05/24   Kommor, Madison, MD  pantoprazole  (PROTONIX ) 40 MG tablet Take 1 tablet (40 mg total) by mouth daily. 12/31/23 03/30/24  Tawkaliyar, Roya, DO  pantoprazole  (PROTONIX ) 40 MG tablet Take 1 tablet (40 mg total) by mouth 2 (two) times daily. 01/12/24 01/11/25  Rihner, Emilie, DO  sevelamer  carbonate (RENVELA ) 800 MG tablet Take 800 mg by mouth 3 (three) times daily with meals. 07/23/20   [provider]  valACYclovir  (VALTREX ) 1000 MG tablet Take 1 tablet (1,000 mg total) by mouth 3 (three) times daily for 7 days. 01/28/24 02/04/24   Shawn Sick, MD     Vitals:   01/30/24 1530 01/30/24 1600 01/30/24 1608 01/30/24 1700  BP: (!) 180/77 (!) 168/73  (!) 165/55  Pulse:  80  81  Resp: 16 13  15   Temp:   97.7 F (36.5 C) 98.4 F (36.9 C)  TempSrc:   Oral Oral  SpO2:  100%  98%  Weight:      Height:       Exam Gen alert, no distress, elderly female No rash, cyanosis or gangrene Sclera anicteric, throat clear  No jvd or bruits Chest clear bilat to bases, no rales/ wheezing RRR no MRG Abd soft ntnd no mass or ascites +bs GU deferred MS no joint effusions or deformity Ext no LE or UE edema, no other edema Neuro is alert, Ox 3 , nf    LUA AVF+bruit   Home bp meds: Norvasc  10 every day Hydralazine  75 mg tid   OP HD: [facility] MWF 3.5h   B350   43.9kg   2K bath AVF  Heparin  none Last OP HD 8/22, post wt 44.2 Good compliance, gets to dry wt IDWG 1-3 kg    Assessment/ Plan: Altered mental status: Felt to be secondary to Valacyclovir  toxicity and/or HSV infection.  Lumbar puncture results are pending.  Hyperkalemia: K+ 6.4, down to 6.1 after temporizing measures. Will give lokelma  also if she can swallow. Plan HD tonight as well w/ low K+ bath.  ESRD: on HD MWF. Last HD yesterday, has not missed HD. Due to high K+ and also possible valtrex  toxicity, will plan extra HD this evening as this can help with valtrex  toxicity.  HTN: on 2 bp meds at home, BP's a bit high here. Per pmd.  Volume: gets to dry wt, low wt gains at OP unit. No vol excess today on exam.  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   COPD H/o CVA       Myer Fret  MD [facility] 01/30/2024, 5:34 PM  Recent Labs  Lab 01/30/24 1343 01/30/24 1408  HGB 15.6* 12.9  ALBUMIN   --  3.5  CALCIUM   --  9.5  CREATININE 7.30* 7.07*  K 6.4* 6.1*   Inpatient medications:  heparin   5,000 Units Subcutaneous Q8H

## 2024-01-30 NOTE — Code Documentation (Signed)
 Stroke Response Nurse Documentation Code Documentation  Ashley [name] is a 73 y.o. female arriving to [facility]  via [facility] on 01-30-24 with past medical hx of CVA, ESRD. On No antithrombotic. Code stroke was activated by EMS.   Patient from home where she was LKW last night before going to bed and now complaining of slurred speech and left side weaknes  Stroke team at the bedside on patient arrival. Labs drawn and patient cleared for CT by EDP. Patient to CT with team. NIHSS 10, see documentation for details and code stroke times. Patient with decreased LOC, bilateral arm weakness, bilateral leg weakness, and dysarthria  on exam. The following imaging was completed:  CT Head, CTA, and CTP. Patient is not a candidate for IV Thrombolytic due to outside TNK windo. Patient is not a candidate for IR due to no LVO on CTA.   Care Plan: VS and NIHSS q 2 hours x 12 hours.   Process Delays Noted: Difficult IV start   Bedside handoff with ED RN Chiquita Pay.    Elvin Portland  Stroke Response RN

## 2024-01-30 NOTE — H&P (Cosign Needed Addendum)
 Date: 01/30/2024               Patient Name:  MILIA WARTH MRN: [id number]  DOB: [birth year] Age / Sex: 73 y.o., female   PCP: [name], DO         Medical Service: Internal Medicine Teaching Service         Attending Physician: Dr. Dayton Eastern      First Contact: Lamonte Penning, DO    Second Contact: Dr. Fairy Pool, DO          Pager Information: First Contact Pager: [phone]   Second Contact Pager: [phone]   SUBJECTIVE   Chief Complaint: stroke  History of Present Illness: KORY PANJWANI is a 73 y.o. female with PMH of ESRD on HD TTS, HTN, COPD, HLD, hx CVA with no known deficits who presented to the ED via EMS for a suspected stroke.  Last known witness was last night according to EDP.  She had initially presented with slurred speech, left-sided weakness and facial droop, and unable to elevate her bilateral legs.  Code stroke was called, neurology assessed the patient and got a CT angio head and neck that was negative for any acute intracranial abnormality.  Per chart review, patient recently started valacyclovir  2 days ago for suspected shingles on left face, neck, and upper chest.  EDP concerned patient is now encephalopathic due to zoster encephalopathy, potentially worsened by valacyclovir  with decreased GFR, and ordered a lumbar puncture to rule out.  On our evaluation, patient is a poor historian, and not clearly answering questions when prompted.  She became a little bit more responsive to questioning over the course of the evaluation, but was sluggish to respond.  Patient does not remember what brought her into the emergency department, nor can she really state what is she is feeling.  Patient was A&O to name and year, but not place or situation.  Last dialysis session was yesterday according to patient.  Spoke with the patient's daughter on the phone to confirm more information regarding dialysis.  She confirms that patient received dialysis yesterday and has a  schedule of MWF.  The patient's daughter states that this altered mental status has intermittently been going on for at least the last 2 months. She states that the patient becomes very agitated, confused, and nonadherent to what she is supposed to be doing for her health, but the majority the time is her normal self.  Relays that the patient has been complaining of headaches the last few days.  She states the patient needs to properly be assessed for dementia, and has been receiving pills from a friend that is supposedly for pain but when further assessed is an antidepressant, potentially Cymbalta.   ED Course: Labs significant for hyperkalemia of 6.4 on admission, since downtrended to 6.1 after calcium  gluconate, insulin , and sodium bicarb injection.  Hyponatremia of 127 improved to 130 on repeat CMP.  BUN elevated to 34, creatinine 7.3 with known history of ESRD.  CBC unremarkable, with no noted leukocytosis.  EtOH negative.  Pending lumbar puncture results.  Imaging: CT angio head and neck:   No proximal intracranial large vessel occlusion or high-grade proximal arterial stenosis identified.  Atherosclerotic plaque within intracranial ICAs with no more than mild stenosis. Notable atherosclerotic plaque within the proximal right ICA, resulting in 40% stenosis.  Patent vertebral arteries with the neck, with nonstenotic plaque at both vertebral artery origins.  There is asymmetrically diminished enhancement of proximal and mid cervical  left bruit vertebral artery with uncertain etiology or clinical significance.  CT Head: No evidence of acute intracranial abnormality, parenchymal atrophy and chronic small vessel ischemic disease.  Received calcium  gluconate, insulin , and sodium bicarb injection Consulted neurology, IMTS  Meds:  Per medication dispense history: Amlodipine  10 mg Atorvastatin  40 mg, nightly B complex vitamins Cinacalcet  60 mg tablet, Dicyclomine  20 mg Gabapentin  100 mg tablet,  twice daily Hydralazine  25 mg-hydrocodone -acetaminophen  Ipratropium-albuterol  Lidocaine  5% patch, once daily Methocarbamol  500 mg tablets, Ondansetron  4 mg tablet  Oxycodone  5mg  tablet Pancrelipase  (Creon ) 12,000u capsule Pantoprazole  40 mg tablet Sevelamer  carbonate 8000mg  tablet Valacyclovir  1gm tablet, TID   No outpatient medications have been marked as taking for the 01/30/24 encounter [facility] Encounter).    Past Medical History ESRD HD MWF Herpes zoster virus affecting left face, neck, upper chest Hypertension Hyperlipidemia COPD History of CVA with no known deficits Suspected dementia, per family  Past Surgical History Past Surgical History:  Procedure Laterality Date   ABDOMINAL HYSTERECTOMY  1979   AV FISTULA PLACEMENT Left 06/16/2017   Procedure: ARTERIOVENOUS (AV) FISTULA CREATION LEFT ARM;  Surgeon: Laurence Redell CROME, MD;  Location: [facility] OR;  Service: Vascular;  Laterality: Left;   BASCILIC VEIN TRANSPOSITION Left 10/02/2017   Procedure: BASILIC VEIN TRANSPOSITION SECOND STAGE LEFT ARM;  Surgeon: Oris Krystal FALCON, MD;  Location: [facility] OR;  Service: Vascular;  Laterality: Left;   BIOPSY  09/06/2019   Procedure: BIOPSY;  Surgeon: Avram Lupita BRAVO, MD;  Location: [facility] ENDOSCOPY;  Service: Endoscopy;;   BIOPSY  03/13/2023   Procedure: BIOPSY;  Surgeon: San Sandor GAILS, DO;  Location: [facility] ENDOSCOPY;  Service: Gastroenterology;;   COLONOSCOPY WITH PROPOFOL  N/A 03/13/2023   Procedure: COLONOSCOPY WITH PROPOFOL ;  Surgeon: San Sandor GAILS, DO;  Location: [facility] ENDOSCOPY;  Service: Gastroenterology;  Laterality: N/A;   ENTEROSCOPY N/A 10/22/2022   Procedure: ENTEROSCOPY;  Surgeon: Legrand Victory CROME DOUGLAS, MD;  Location: [facility] ENDOSCOPY;  Service: Gastroenterology;  Laterality: N/A;   ESOPHAGOGASTRODUODENOSCOPY N/A 05/29/2013   Procedure: ESOPHAGOGASTRODUODENOSCOPY (EGD);  Surgeon: Gordy CHRISTELLA Starch, MD;  Location: [facility] ENDOSCOPY;  Service: Endoscopy;  Laterality: N/A;   ESOPHAGOGASTRODUODENOSCOPY   05/2013   for epigastric pain.  Nonobstructing Schatzki ring at GEJ, mild gastropathy, nonbleeding AVMs in bulb and D2. 5 mm sessile polyp in bulb.   ESOPHAGOGASTRODUODENOSCOPY (EGD) WITH PROPOFOL  N/A 09/06/2019   Procedure: ESOPHAGOGASTRODUODENOSCOPY (EGD) WITH PROPOFOL ;  Surgeon: Avram Lupita BRAVO, MD;  Location: [facility] ENDOSCOPY;  Service: Endoscopy;  Laterality: N/A;   ESOPHAGOGASTRODUODENOSCOPY (EGD) WITH PROPOFOL  N/A 02/02/2020   Procedure: ESOPHAGOGASTRODUODENOSCOPY (EGD) WITH PROPOFOL ;  Surgeon: Teressa Toribio SQUIBB, MD;  Location: [facility] ENDOSCOPY;  Service: Endoscopy;  Laterality: N/A;   ESOPHAGOGASTRODUODENOSCOPY (EGD) WITH PROPOFOL  N/A 02/07/2021   Procedure: ESOPHAGOGASTRODUODENOSCOPY (EGD) WITH PROPOFOL ;  Surgeon: Teressa Toribio SQUIBB, MD;  Location: [facility] ENDOSCOPY;  Service: Endoscopy;  Laterality: N/A;   ESOPHAGOGASTRODUODENOSCOPY (EGD) WITH PROPOFOL  N/A 03/13/2023   Procedure: ESOPHAGOGASTRODUODENOSCOPY (EGD) WITH PROPOFOL ;  Surgeon: San Sandor GAILS, DO;  Location: [facility] ENDOSCOPY;  Service: Gastroenterology;  Laterality: N/A;   EUS N/A 02/02/2020   Procedure: UPPER ENDOSCOPIC ULTRASOUND (EUS) RADIAL;  Surgeon: Teressa Toribio SQUIBB, MD;  Location: [facility] ENDOSCOPY;  Service: Endoscopy;  Laterality: N/A;   EUS N/A 08/20/2023   Procedure: UPPER ENDOSCOPIC ULTRASOUND (EUS) RADIAL;  Surgeon: Wilhelmenia Aloha Raddle., MD;  Location: [facility] ENDOSCOPY;  Service: Gastroenterology;  Laterality: N/A;   EXCHANGE OF A DIALYSIS CATHETER Left 07/31/2017   Procedure: Removal  OF A  Right GroinTUNNELED  DIALYSIS CATHETER ,  Insertion  of Left Femoral Dialysis Catheter.;  Surgeon: Oris Krystal FALCON, MD;  Location: [facility] OR;  Service: Vascular;  Laterality: Left;   FISTULOGRAM Left 11/19/2023   Procedure: FISTULOGRAM WITH ANGIOPLASTY OF FISTULA;  Surgeon: Magda Debby SAILOR, MD;  Location: [facility] OR;  Service: Vascular;  Laterality: Left;   HEMOSTASIS CLIP PLACEMENT  02/07/2021   Procedure: HEMOSTASIS CLIP PLACEMENT;  Surgeon: Teressa Toribio SQUIBB,  MD;  Location: [facility] ENDOSCOPY;  Service: Endoscopy;;   HEMOSTASIS CLIP PLACEMENT  10/22/2022   Procedure: HEMOSTASIS CLIP PLACEMENT;  Surgeon: Legrand Victory LITTIE DOUGLAS, MD;  Location: [facility] ENDOSCOPY;  Service: Gastroenterology;;   HEMOSTASIS CLIP PLACEMENT  03/13/2023   Procedure: HEMOSTASIS CLIP PLACEMENT;  Surgeon: San Sandor GAILS, DO;  Location: [facility] ENDOSCOPY;  Service: Gastroenterology;;   HEMOSTASIS CLIP PLACEMENT  08/20/2023   Procedure: CONTROL OF HEMORRHAGE, GI TRACT, ENDOSCOPIC, BY CLIPPING OR OVERSEWING;  Surgeon: Wilhelmenia Aloha Raddle., MD;  Location: [facility] ENDOSCOPY;  Service: Gastroenterology;;   HOT HEMOSTASIS N/A 09/06/2019   Procedure: HOT HEMOSTASIS (ARGON PLASMA COAGULATION/BICAP);  Surgeon: Avram Lupita BRAVO, MD;  Location: [facility] ENDOSCOPY;  Service: Endoscopy;  Laterality: N/A;   HOT HEMOSTASIS N/A 02/07/2021   Procedure: HOT HEMOSTASIS (ARGON PLASMA COAGULATION/BICAP);  Surgeon: Teressa Toribio SQUIBB, MD;  Location: [facility] ENDOSCOPY;  Service: Endoscopy;  Laterality: N/A;   HOT HEMOSTASIS N/A 10/22/2022   Procedure: HOT HEMOSTASIS (ARGON PLASMA COAGULATION/BICAP);  Surgeon: Legrand Victory LITTIE DOUGLAS, MD;  Location: [facility] ENDOSCOPY;  Service: Gastroenterology;  Laterality: N/A;   HOT HEMOSTASIS N/A 08/20/2023   Procedure: EGD, WITH ARGON PLASMA COAGULATION;  Surgeon: Wilhelmenia Aloha Raddle., MD;  Location: [facility] ENDOSCOPY;  Service: Gastroenterology;  Laterality: N/A;   INSERTION OF DIALYSIS CATHETER Right 06/16/2017   Procedure: INSERTION OF DIALYSIS CATHETER;  Surgeon: Laurence Redell LITTIE, MD;  Location: [facility] OR;  Service: Vascular;  Laterality: Right;   IR AV DIALY SHUNT INTRO NEEDLE/INTRACATH INITIAL W/PTA/IMG LEFT  06/21/2018   POLYPECTOMY  03/13/2023   Procedure: POLYPECTOMY;  Surgeon: San Sandor GAILS, DO;  Location: [facility] ENDOSCOPY;  Service: Gastroenterology;;   POLYPECTOMY  08/20/2023   Procedure: POLYPECTOMY;  Surgeon: Wilhelmenia Aloha Raddle., MD;  Location: [facility] ENDOSCOPY;  Service: Gastroenterology;;   REPAIR  OF PERFORATED ULCER  1990   gastric ulcer   SCLEROTHERAPY  10/22/2022   Procedure: MATIAS;  Surgeon: Legrand Victory LITTIE DOUGLAS, MD;  Location: [facility] ENDOSCOPY;  Service: Gastroenterology;;   SUBMUCOSAL TATTOO INJECTION  10/22/2022   Procedure: SUBMUCOSAL TATTOO INJECTION;  Surgeon: Legrand Victory LITTIE DOUGLAS, MD;  Location: [facility] ENDOSCOPY;  Service: Gastroenterology;;     Social:  Lives With: Daughter and granddaughter Occupation: Retired Support: Family Level of Function: Previously dependent with home health PT PCP:  [name], DO  Substances: -Tobacco: Did not ask -Alcohol: Did not ask -Recreational Drug: Previous user, cocaine and marijuana  Family History:  Family History  Problem Relation Age of Onset   Hypertension Father    Cancer Father    Hyperlipidemia Father    Seizures Sister    Early death Daughter    Kidney disease Daughter        end stage dialysis dependent    Breast cancer Maternal Aunt      Allergies: Allergies as of 01/30/2024 - Reviewed 01/30/2024  Allergen Reaction Noted   Aspirin  Nausea And Vomiting 07/18/2009   Ibuprofen Nausea And Vomiting 12/24/2009    Review of Systems: A complete ROS was negative except as per HPI.   OBJECTIVE:   Physical Exam: Blood pressure (!) 170/70, pulse  75, resp. rate 17, height 5' 2 (1.575 m), weight 45.8 kg, SpO2 97%.  Constitutional: Chronically ill-appearing female laying in bed, in no acute distress HENT: normocephalic atraumatic, mucous membranes moist Eyes: conjunctiva non-erythematous Neck: supple Cardiovascular: regular rate and rhythm, no m/r/g Pulmonary/Chest: normal work of breathing on room air, lungs clear to auscultation bilaterally Abdominal: soft, non-tender, non-distended Neurological: alert & oriented x 2 to self and year, 4/5 grip strength of right hand, absence of myoclonic jerks, nonparticipatory with lower extremity motor testing.  Intact appropriate sensation to bilateral feet, moves left arm  freely against gravity, shoulder flexion 3 out of 5 in strength.  Nonparticipatory with extraocular movements for cranial nerve assessment.  Other cranial nerves within normal limits. Skin: warm and dry, bilateral DP pulses 2+, no edema Psych: Sluggish appearing, slow to answer probing questions  Labs: CBC    Component Value Date/Time   WBC 4.7 01/30/2024 1408   RBC 4.53 01/30/2024 1408   HGB 12.9 01/30/2024 1408   HGB 13.1 04/02/2018 1058   HCT 41.0 01/30/2024 1408   HCT 38.0 04/02/2018 1058   PLT 224 01/30/2024 1408   PLT 312 04/02/2018 1058   MCV 90.5 01/30/2024 1408   MCV 91 04/02/2018 1058   MCH 28.5 01/30/2024 1408   MCHC 31.5 01/30/2024 1408   RDW 18.7 (H) 01/30/2024 1408   RDW 14.0 04/02/2018 1058   LYMPHSABS 1.6 01/30/2024 1408   LYMPHSABS 3.1 11/13/2016 1543   MONOABS 0.7 01/30/2024 1408   EOSABS 0.2 01/30/2024 1408   EOSABS 0.2 11/13/2016 1543   BASOSABS 0.1 01/30/2024 1408   BASOSABS 0.0 11/13/2016 1543     CMP     Component Value Date/Time   NA 130 (L) 01/30/2024 1408   NA 139 12/31/2023 1055   K 6.1 (H) 01/30/2024 1408   CL 92 (L) 01/30/2024 1408   CO2 24 01/30/2024 1408   GLUCOSE 91 01/30/2024 1408   BUN 24 (H) 01/30/2024 1408   BUN 33 (H) 12/31/2023 1055   CREATININE 7.07 (H) 01/30/2024 1408   CREATININE 1.52 (H) 09/19/2014 1411   CALCIUM  9.5 01/30/2024 1408   PROT 7.2 01/30/2024 1408   PROT 7.4 04/02/2018 1058   ALBUMIN  3.5 01/30/2024 1408   ALBUMIN  4.7 12/31/2023 1055   AST 21 01/30/2024 1408   ALT 8 01/30/2024 1408   ALT 18 04/06/2019 1424   ALKPHOS 83 01/30/2024 1408   BILITOT 0.5 01/30/2024 1408   BILITOT 0.3 04/02/2018 1058   GFRNONAA 6 (L) 01/30/2024 1408   GFRAA 8 (L) 01/11/2020 0231    Imaging:  CT ANGIO HEAD NECK W WO CM W PERF (CODE STROKE) Addendum Date: 01/30/2024 ADDENDUM REPORT: 01/30/2024 15:10 ADDENDUM: No emergent large vessel occlusion identified. This result and CTA neck impression #2 called by telephone at the time of  interpretation on 01/30/2024 at 3:10 pm to provider MCNE[facility] , who verbally acknowledged these results. Electronically Signed   By: Rockey Childs D.O.   On: 01/30/2024 15:10   Result Date: 01/30/2024 CLINICAL DATA:  Provided history: Neuro deficit, acute, stroke suspected. Additional history obtained from electronic MEDICAL RECORD NUMBERSlurred speech, left-sided weakness, left-sided facial droop. EXAM: CT ANGIOGRAPHY HEAD AND NECK CT PERFUSION BRAIN TECHNIQUE: Multidetector CT imaging of the head and neck was performed using the standard protocol during bolus administration of intravenous contrast. Multiplanar CT image reconstructions and MIPs were obtained to evaluate the vascular anatomy. Carotid stenosis measurements (when applicable) are obtained utilizing NASCET criteria, using the distal internal carotid  diameter as the denominator. Multiphase CT imaging of the brain was performed following IV bolus contrast injection. Subsequent parametric perfusion maps were calculated using RAPID software. RADIATION DOSE REDUCTION: This exam was performed according to the departmental dose-optimization program which includes automated exposure control, adjustment of the mA and/or kV according to patient size and/or use of iterative reconstruction technique. CONTRAST:  OMNIPAQUE  IOHEXOL  350 MG/ML SOLN COMPARISON:  Noncontrast head CT performed earlier today 01/30/2024. FINDINGS: CTA NECK FINDINGS Aortic arch: Common origin of the innominate and left common carotid arteries. Atherosclerotic plaque within the aortic arch and proximal major branch vessels of the neck. Streak/beam hardening artifact arising from a dense contrast bolus partially obscures the right subclavian artery. Within this limitation, there is no appreciable hemodynamically significant innominate or proximal subclavian artery stenosis. Right carotid system: CCA and ICA patent within the neck. Atherosclerotic plaque within the proximal ICA  resulting in 40% stenosis. Tortuosity of the distal cervical ICA Left carotid system: CCA and ICA patent within the neck without measurable stenosis. Mild atherosclerotic plaque scattered within the CCA, but the carotid bifurcation and within the proximal ICA. Tortuosity of the distal cervical ICA. Vertebral arteries: Patent within neck. Nonstenotic atherosclerotic plaque at the bilateral vertebral artery origins. There is asymmetrically diminished enhancement of the proximal and mid cervical left vertebral artery of uncertain etiology and clinical significance. Skeleton: Edentulous maxilla. Numerous absent mandibular teeth. Spondylosis at the cervical and visualized upper thoracic levels. No acute fracture or aggressive osseous lesion. Other neck: Subcentimeter nodule within the right thyroid  lobe not meeting consensus criteria for ultrasound follow-up based on size. No follow-up imaging recommended. Reference: J Am Coll Radiol. 2015 Feb;12(2): 143-50. Upper chest: No consolidation within the imaged lung apices. Emphysema. Review of the MIP images confirms the above findings CTA HEAD FINDINGS Anterior circulation: The intracranial internal carotid arteries are patent. Atherosclerotic plaque bilaterally with no more than mild stenosis. The M1 middle cerebral arteries are patent. No M2 proximal branch occlusion or high-grade proximal stenosis. The anterior cerebral arteries are patent. No intracranial aneurysm is identified. Posterior circulation: The intracranial vertebral arteries are patent. Calcified atherosclerotic plaque within the V4 left vertebral artery resulting in moderate stenosis. The basilar artery is patent. The posterior cerebral arteries are patent. A left posterior communicating artery is present. The right posterior communicating artery is diminutive or absent. Venous sinuses: Contrast timing limits evaluation for open sinus thrombosis. Anatomic variants: As described. Review of the MIP images  confirms the above findings CT Brain Perfusion Findings: CBF (<30%) Volume: 0mL Perfusion (Tmax>6.0s) volume: 0mL Mismatch Volume: 0mL Infarction Location:None identified Attempts are being made to reach the ordering provider at this time. IMPRESSION: CTA neck: 1. The common carotid and internal carotid arteries are patent within the neck. Atherosclerotic plaque bilaterally as described. Most notably, atherosclerotic plaque within the proximal right ICA results in 40% stenosis. 2. Vertebral arteries patent within the neck. Nonstenotic plaque at both vertebral artery origins. There is asymmetrically diminished enhancement of the proximal and mid cervical left vertebral artery of uncertain etiology and clinical significance. 3. Aortic Atherosclerosis (ICD10-I70.0) and Emphysema (ICD10-J43.9). CTA head: 1. No proximal intracranial large vessel occlusion or high-grade proximal arterial stenosis identified. 2. Atherosclerotic plaque within the intracranial ICAs with no more than mild stenosis. CT perfusion head: The perfusion software identifies no core infarct. The perfusion software identifies no hypoperfused parenchyma (utilizing the Tmax>6 seconds threshold). No mismatch volume reported. Electronically Signed: By: Rockey Childs D.O. On: 01/30/2024 14:57   CT HEAD CODE  STROKE WO CONTRAST Result Date: 01/30/2024 CLINICAL DATA:  Code stroke. Provided history: Neuro deficit, acute, stroke suspected. EXAM: CT HEAD WITHOUT CONTRAST TECHNIQUE: Contiguous axial images were obtained from the base of the skull through the vertex without intravenous contrast. RADIATION DOSE REDUCTION: This exam was performed according to the departmental dose-optimization program which includes automated exposure control, adjustment of the mA and/or kV according to patient size and/or use of iterative reconstruction technique. COMPARISON:  Head CT 12/22/2023.  Brain MRI 12/22/2023. FINDINGS: Brain: Generalized cerebral atrophy. Patchy and  ill-defined hypoattenuation within the cerebral white matter, nonspecific but compatible with advanced chronic small vessel ischemic disease. There is no acute intracranial hemorrhage. No demarcated cortical infarct. No extra-axial fluid collection. No evidence of an intracranial mass. No midline shift. Vascular: No hyperdense vessel.  Atherosclerotic calcifications. Skull: No calvarial fracture or aggressive osseous lesion. Sinuses/Orbits: No mass or acute finding within the imaged orbits. No significant paranasal sinus disease at the imaged levels. ASPECTS [facility] Stroke Program Early CT Score) - Ganglionic level infarction (caudate, lentiform nuclei, internal capsule, insula, M1-M3 cortex): 7 - Supraganglionic infarction (M4-M6 cortex): 3 Total score (0-10 with 10 being normal): 10 No evidence of an acute intracranial abnormality. These results were communicated to Dr. Michaela at 1:49 pmon 8/23/2025by text page via the [facility] messaging system. IMPRESSION: 1. No evidence of an acute intracranial abnormality. 2. Parenchymal atrophy and chronic small vessel ischemic disease. Electronically Signed   By: Rockey Childs D.O.   On: 01/30/2024 13:49      ASSESSMENT & PLAN:   Assessment & Plan by Problem: Principal Problem:   Acute encephalopathy   ZOIE SARIN is a 73 y.o. person living with a history of HD TTS, HTN, COPD, HLD, hx CVA with no known deficits who presented with a suspected stroke and admitted for acute encephalopathy on hospital day 0.  Acute encephalopathy Agitation with suspected delirium  Initially ANO x 1 in ED, sluggish to respond to providers' questions.  Thought to have stroke, code stroke was called.  CT head showed no acute intracranial abnormalities, neurology suspicious for herpes zoster encephalitis given patient's active shingles infection.  Patient on valacyclovir  started 2 days ago, likely only taking it yesterday after dialysis for 1 full session.  EDP ordered LP to rule  out zoster encephalitis, pending results.  Consulted nephrology regarding HD, and noted that patient is taking gabapentin , which can also cause encephalopathy in ESRD patients.  Will get MRI brain and angio neck.  Patient's daughter concerned for dementia/delirium given patient's change in baseline mental status over the last 2 months.  Reports more agitation with confusion, which can be contributing to patient's acute encephalopathy.  Will monitor and place on delirium precautions. Patient may benefit from prescribed antidepressant after resolution of acute illness. - Pending MRI brain and neck - Pending lumbar puncture CSF w Gram stain results to rule out zoster encephalitis - CSF cell count with differential, meningitis/encephalitis panel, CSF protein and glucose pending-delirium precautions - Trend CBCs - Consider starting antidepressant, whether inpatient or outpatient setting -- PT OT evaluation   Hyperkalemia Seemingly has a history of slightly elevated potassium during prior hospitalizations.  Potassium on admission 6.4.  Was given calcium  gluconate, sodium bicarb, and insulin  in ED, which improved potassium to 6.1.  Consider Lokelma  if patient's potassium does not improve after encephalopathic episode.  Will get EKG - Hold giving Lokelma  for now - Pend EKG - Trend BMPs  Hyponatremia Sodium on admission 127, baseline normally within normal  limits.  Improved to 130 on repeat lab, will continue to monitor. - Trend BMP  ESRD HD MWF Patient has longstanding history of ESRD with dialysis.  Last dialysis session was yesterday, confirmed by nephrology and patient's daughter.  Nephrology consulted to assess patient to put her on dialysis schedule while inpatient. - Strict I's and O's - Daily weights -- Hold all medications until source for acute encephalopathy is located.  Will restart Cinacalcet , Sevelamer , and Creon  after mental status improves  COPD Chronic, stable. On home inhalers.  Currently on 2L [place], saturating at 100%.  Will attempt to wean down as tolerated when patient is in more stable state. - Resume home inhalers  HTN Remains hypertensive during admission to 170 systolic.  Medications last taken this morning per patient, usuallly takes amlodipine  10mg  nightly and hydralazine  25mg  TID.  Previously held in the setting of of suspected stroke, now for acute encephalopathy.  Will restart medications once mental status improves.  HLD Recently increased atorvastatin  dose to 40mg  for longstanding hyperlipidemia and history of CVA.  Last lipid panel 12/31/2023 shows LDL 78, total cholesterol 804, triglycerides 99, HDL 100. -- Restart statin once encephalopathy improves  HX CVA No known residual deficits. Last stroke January 2019.  Not currently on aspirin  treatment.  Best practice: Diet: NPO VTE: Heparin  IVF: None,None Code: Full  Disposition planning: Prior to Admission Living Arrangement: Home, living with daughter and granddaughter Anticipated Discharge Location: pending given clinical improvement  Dispo: Admit patient to Inpatient with expected length of stay greater than 2 midnights.  Signed: Chennel Olivos, DO Internal Medicine Resident, PGY-1 Please contact the on call pager at [phone] for any urgent or emergent needs. 5:11 PM 01/30/2024

## 2024-01-30 NOTE — Progress Notes (Signed)
 Hep B immunity status was obtained from the documentation found in Care Everywhere [facility] Episode of Care Note dated 01/30/24  01/30/24 1839  Hepatitis B Pre Treatment Patient Checks  Hepatitis B Surface Antigen Results Nonreactive  Date Hepatitis B Surface Antigen Drawn 01/18/24  Hep B Antibody Quant/Post 65  Date Hep B Antibody Quant/Post Drawn 03/23/23  Patient's Immunity Status Immune  Isolation Initiated No

## 2024-01-30 NOTE — ED Notes (Signed)
 Admitting MD at bedside.

## 2024-01-30 NOTE — ED Notes (Signed)
 EDP at bedside to perform LP. Consent not obtained by nurse due to AMS and family not present at bedside. EDP notified and aware.

## 2024-01-30 NOTE — Progress Notes (Signed)
 Received patient on unit. Bed locked at the lowest position call bell within reach.

## 2024-01-30 NOTE — ED Triage Notes (Signed)
 Pt bib EMS for code stroke. LKW determined to be last night per neuro at bedside. Patient is oriented to person only. Slurred speech, L sided weakness, L sided facial droop, unable to hold up either legs. Hx of stroke with no known deficits per family. CBG 99 on arrival to ED.

## 2024-01-30 NOTE — ED Notes (Signed)
 Primary RN at bedside to take over.

## 2024-01-31 ENCOUNTER — Inpatient Hospital Stay (HOSPITAL_COMMUNITY)

## 2024-01-31 DIAGNOSIS — R4781 Slurred speech: Secondary | ICD-10-CM | POA: Diagnosis not present

## 2024-01-31 DIAGNOSIS — B029 Zoster without complications: Secondary | ICD-10-CM | POA: Diagnosis not present

## 2024-01-31 DIAGNOSIS — E785 Hyperlipidemia, unspecified: Secondary | ICD-10-CM

## 2024-01-31 DIAGNOSIS — G9340 Encephalopathy, unspecified: Secondary | ICD-10-CM

## 2024-01-31 DIAGNOSIS — J449 Chronic obstructive pulmonary disease, unspecified: Secondary | ICD-10-CM

## 2024-01-31 DIAGNOSIS — Z79899 Other long term (current) drug therapy: Secondary | ICD-10-CM

## 2024-01-31 DIAGNOSIS — E875 Hyperkalemia: Secondary | ICD-10-CM

## 2024-01-31 DIAGNOSIS — Z8673 Personal history of transient ischemic attack (TIA), and cerebral infarction without residual deficits: Secondary | ICD-10-CM

## 2024-01-31 DIAGNOSIS — I120 Hypertensive chronic kidney disease with stage 5 chronic kidney disease or end stage renal disease: Secondary | ICD-10-CM

## 2024-01-31 DIAGNOSIS — E871 Hypo-osmolality and hyponatremia: Secondary | ICD-10-CM

## 2024-01-31 DIAGNOSIS — Z992 Dependence on renal dialysis: Secondary | ICD-10-CM

## 2024-01-31 DIAGNOSIS — T375X4A Poisoning by antiviral drugs, undetermined, initial encounter: Secondary | ICD-10-CM | POA: Diagnosis not present

## 2024-01-31 DIAGNOSIS — I6502 Occlusion and stenosis of left vertebral artery: Secondary | ICD-10-CM

## 2024-01-31 DIAGNOSIS — R451 Restlessness and agitation: Secondary | ICD-10-CM

## 2024-01-31 LAB — CBC WITH DIFFERENTIAL/PLATELET
Abs Immature Granulocytes: 0.01 K/uL (ref 0.00–0.07)
Basophils Absolute: 0 K/uL (ref 0.0–0.1)
Basophils Relative: 1 %
Eosinophils Absolute: 0.1 K/uL (ref 0.0–0.5)
Eosinophils Relative: 2 %
HCT: 37.1 % (ref 36.0–46.0)
Hemoglobin: 12.2 g/dL (ref 12.0–15.0)
Immature Granulocytes: 0 %
Lymphocytes Relative: 25 %
Lymphs Abs: 1.2 K/uL (ref 0.7–4.0)
MCH: 28.8 pg (ref 26.0–34.0)
MCHC: 32.9 g/dL (ref 30.0–36.0)
MCV: 87.7 fL (ref 80.0–100.0)
Monocytes Absolute: 0.6 K/uL (ref 0.1–1.0)
Monocytes Relative: 13 %
Neutro Abs: 3 K/uL (ref 1.7–7.7)
Neutrophils Relative %: 59 %
Platelets: 213 K/uL (ref 150–400)
RBC: 4.23 MIL/uL (ref 3.87–5.11)
RDW: 18.4 % — ABNORMAL HIGH (ref 11.5–15.5)
WBC: 4.9 K/uL (ref 4.0–10.5)
nRBC: 0 % (ref 0.0–0.2)

## 2024-01-31 LAB — VITAMIN B12: Vitamin B-12: 477 pg/mL (ref 180–914)

## 2024-01-31 LAB — RENAL FUNCTION PANEL
Albumin: 3.2 g/dL — ABNORMAL LOW (ref 3.5–5.0)
Anion gap: 12 (ref 5–15)
BUN: 7 mg/dL — ABNORMAL LOW (ref 8–23)
CO2: 28 mmol/L (ref 22–32)
Calcium: 9.4 mg/dL (ref 8.9–10.3)
Chloride: 94 mmol/L — ABNORMAL LOW (ref 98–111)
Creatinine, Ser: 4.09 mg/dL — ABNORMAL HIGH (ref 0.44–1.00)
GFR, Estimated: 11 mL/min — ABNORMAL LOW (ref >60)
Glucose, Bld: 76 mg/dL (ref 70–99)
Phosphorus: 4.5 mg/dL (ref 2.5–4.6)
Potassium: 3.7 mmol/L (ref 3.5–5.1)
Sodium: 134 mmol/L — ABNORMAL LOW (ref 135–145)

## 2024-01-31 LAB — TSH: TSH: 3.312 u[IU]/mL (ref 0.350–4.500)

## 2024-01-31 LAB — AMMONIA: Ammonia: 34 umol/L (ref 9–35)

## 2024-01-31 NOTE — Progress Notes (Addendum)
 Received pt from Dialysis, alert and oriented to self. Check in completed, vital signs taken.

## 2024-01-31 NOTE — Progress Notes (Signed)
 Ashley [name] SUBJECTIVE:  Patient Summary: Ashley [name] is a 73 y.o. female with a history of HD MWF, HTN, COPD, HLD, hx CVA with no known deficits who presented with a suspected stroke and admitted for acute encephalopathy.  Overnight Events: none  Interim History: Patient evaluated at bedside this morning looking remarkably better than she had on admission. She is alert and oriented, and mostly able to answer questions appropriately. She denies any headaches, pain, or shortness of breath. She feels like she is better and is able to recall having had dialysis last night.  OBJECTIVE:  Vital Signs: Vitals:   01/31/24 0036 01/31/24 0418 01/31/24 0500 01/31/24 0750  BP: (!) 149/72 (!) 158/68  (!) 150/67  Pulse: 94 96  90  Resp: (!) 25 18  16   Temp: 98.7 F (37.1 C) 99.1 F (37.3 C)  98.5 F (36.9 C)  TempSrc: Oral Oral  Oral  SpO2: 100% 98%  93%  Weight:   45.2 kg   Height:       Supplemental O2: Nasal Cannula SpO2: 93 % O2 Flow Rate (L/min): 2 L/min  Filed Weights   01/30/24 1355 01/30/24 2000 01/31/24 0500  Weight: 45.8 kg 46.9 kg 45.2 kg     Intake/Output Summary (Last 24 hours) at 01/31/2024 0958 Last data filed at 01/31/2024 0200 Gross per 24 hour  Intake 120 ml  Output 2000 ml  Net -1880 ml   Net IO Since Admission: -1,880 mL [01/31/24 0958]  Physical Exam: Physical Exam Constitutional:      Appearance: Normal appearance.  Cardiovascular:     Rate and Rhythm: Normal rate and regular rhythm.     Pulses: Normal pulses.  Pulmonary:     Effort: Pulmonary effort is normal.     Breath sounds: Normal breath sounds.  Abdominal:     General: Abdomen is flat. Bowel sounds are normal.     Palpations: Abdomen is soft.  Skin:    General: Skin is warm and dry.  Neurological:     General: No focal deficit present.     Mental Status: She is alert and oriented to person, place, and time.  Psychiatric:        Mood and Affect: Mood normal.        Behavior: Behavior normal.      Patient Lines/Drains/Airways Status     Active Line/Drains/Airways     Name Placement date Placement time Site Days   Peripheral IV 01/30/24 18 G Right Antecubital 01/30/24  1400  Antecubital  1   Fistula / Graft Left Upper arm Arteriovenous fistula --  --  Upper arm  --   Wound 11/19/23 1249 Surgical Closed Surgical Incision Arm Right 11/19/23  1249  Arm  73            Pertinent labs and imaging:     Latest Ref Rng & Units 01/31/2024    4:02 AM 01/30/2024    2:08 PM 01/30/2024    1:43 PM  CBC  WBC 4.0 - 10.5 K/uL 4.9  4.7    Hemoglobin 12.0 - 15.0 g/dL 87.7  87.0  84.3   Hematocrit 36.0 - 46.0 % 37.1  41.0  46.0   Platelets 150 - 400 K/uL 213  224         Latest Ref Rng & Units 01/31/2024    4:02 AM 01/30/2024    7:08 PM 01/30/2024    2:08 PM  CMP  Glucose 70 - 99 mg/dL 76  81  91   BUN 8 - 23 mg/dL 7  26  24    Creatinine 0.44 - 1.00 mg/dL 5.90  2.64  2.92   Sodium 135 - 145 mmol/L 134  130  130   Potassium 3.5 - 5.1 mmol/L 3.7  5.3  6.1   Chloride 98 - 111 mmol/L 94  89  92   CO2 22 - 32 mmol/L 28  25  24    Calcium  8.9 - 10.3 mg/dL 9.4  9.2  9.5   Total Protein 6.5 - 8.1 g/dL   7.2   Total Bilirubin 0.0 - 1.2 mg/dL   0.5   Alkaline Phos 38 - 126 U/L   83   AST 15 - 41 U/L   21   ALT 0 - 44 U/L   8     CT ANGIO HEAD NECK W WO CM W PERF (CODE STROKE) Addendum Date: 01/30/2024 ADDENDUM REPORT: 01/30/2024 15:10 ADDENDUM: No emergent large vessel occlusion identified. This result and CTA neck impression #2 called by telephone at the time of interpretation on 01/30/2024 at 3:10 pm to provider [facility] , who verbally acknowledged these results. Electronically Signed   By: Rockey Childs D.O.   On: 01/30/2024 15:10   Result Date: 01/30/2024 CLINICAL DATA:  Provided history: Neuro deficit, acute, stroke suspected. Additional history obtained from electronic MEDICAL RECORD NUMBERSlurred speech, left-sided weakness, left-sided facial droop. EXAM: CT ANGIOGRAPHY HEAD  AND NECK CT PERFUSION BRAIN TECHNIQUE: Multidetector CT imaging of the head and neck was performed using the standard protocol during bolus administration of intravenous contrast. Multiplanar CT image reconstructions and MIPs were obtained to evaluate the vascular anatomy. Carotid stenosis measurements (when applicable) are obtained utilizing NASCET criteria, using the distal internal carotid diameter as the denominator. Multiphase CT imaging of the brain was performed following IV bolus contrast injection. Subsequent parametric perfusion maps were calculated using RAPID software. RADIATION DOSE REDUCTION: This exam was performed according to the departmental dose-optimization program which includes automated exposure control, adjustment of the mA and/or kV according to patient size and/or use of iterative reconstruction technique. CONTRAST:  OMNIPAQUE  IOHEXOL  350 MG/ML SOLN COMPARISON:  Noncontrast head CT performed earlier today 01/30/2024. FINDINGS: CTA NECK FINDINGS Aortic arch: Common origin of the innominate and left common carotid arteries. Atherosclerotic plaque within the aortic arch and proximal major branch vessels of the neck. Streak/beam hardening artifact arising from a dense contrast bolus partially obscures the right subclavian artery. Within this limitation, there is no appreciable hemodynamically significant innominate or proximal subclavian artery stenosis. Right carotid system: CCA and ICA patent within the neck. Atherosclerotic plaque within the proximal ICA resulting in 40% stenosis. Tortuosity of the distal cervical ICA Left carotid system: CCA and ICA patent within the neck without measurable stenosis. Mild atherosclerotic plaque scattered within the CCA, but the carotid bifurcation and within the proximal ICA. Tortuosity of the distal cervical ICA. Vertebral arteries: Patent within neck. Nonstenotic atherosclerotic plaque at the bilateral vertebral artery origins. There is  asymmetrically diminished enhancement of the proximal and mid cervical left vertebral artery of uncertain etiology and clinical significance. Skeleton: Edentulous maxilla. Numerous absent mandibular teeth. Spondylosis at the cervical and visualized upper thoracic levels. No acute fracture or aggressive osseous lesion. Other neck: Subcentimeter nodule within the right thyroid  lobe not meeting consensus criteria for ultrasound follow-up based on size. No follow-up imaging recommended. Reference: J Am Coll Radiol. 2015 Feb;12(2): 143-50. Upper chest: No consolidation within the imaged lung apices. Emphysema. Review of the MIP  images confirms the above findings CTA HEAD FINDINGS Anterior circulation: The intracranial internal carotid arteries are patent. Atherosclerotic plaque bilaterally with no more than mild stenosis. The M1 middle cerebral arteries are patent. No M2 proximal branch occlusion or high-grade proximal stenosis. The anterior cerebral arteries are patent. No intracranial aneurysm is identified. Posterior circulation: The intracranial vertebral arteries are patent. Calcified atherosclerotic plaque within the V4 left vertebral artery resulting in moderate stenosis. The basilar artery is patent. The posterior cerebral arteries are patent. A left posterior communicating artery is present. The right posterior communicating artery is diminutive or absent. Venous sinuses: Contrast timing limits evaluation for open sinus thrombosis. Anatomic variants: As described. Review of the MIP images confirms the above findings CT Brain Perfusion Findings: CBF (<30%) Volume: 0mL Perfusion (Tmax>6.0s) volume: 0mL Mismatch Volume: 0mL Infarction Location:None identified Attempts are being made to reach the ordering provider at this time. IMPRESSION: CTA neck: 1. The common carotid and internal carotid arteries are patent within the neck. Atherosclerotic plaque bilaterally as described. Most notably, atherosclerotic plaque  within the proximal right ICA results in 40% stenosis. 2. Vertebral arteries patent within the neck. Nonstenotic plaque at both vertebral artery origins. There is asymmetrically diminished enhancement of the proximal and mid cervical left vertebral artery of uncertain etiology and clinical significance. 3. Aortic Atherosclerosis (ICD10-I70.0) and Emphysema (ICD10-J43.9). CTA head: 1. No proximal intracranial large vessel occlusion or high-grade proximal arterial stenosis identified. 2. Atherosclerotic plaque within the intracranial ICAs with no more than mild stenosis. CT perfusion head: The perfusion software identifies no core infarct. The perfusion software identifies no hypoperfused parenchyma (utilizing the Tmax>6 seconds threshold). No mismatch volume reported. Electronically Signed: By: Rockey Childs D.O. On: 01/30/2024 14:57   CT HEAD CODE STROKE WO CONTRAST Result Date: 01/30/2024 CLINICAL DATA:  Code stroke. Provided history: Neuro deficit, acute, stroke suspected. EXAM: CT HEAD WITHOUT CONTRAST TECHNIQUE: Contiguous axial images were obtained from the base of the skull through the vertex without intravenous contrast. RADIATION DOSE REDUCTION: This exam was performed according to the departmental dose-optimization program which includes automated exposure control, adjustment of the mA and/or kV according to patient size and/or use of iterative reconstruction technique. COMPARISON:  Head CT 12/22/2023.  Brain MRI 12/22/2023. FINDINGS: Brain: Generalized cerebral atrophy. Patchy and ill-defined hypoattenuation within the cerebral white matter, nonspecific but compatible with advanced chronic small vessel ischemic disease. There is no acute intracranial hemorrhage. No demarcated cortical infarct. No extra-axial fluid collection. No evidence of an intracranial mass. No midline shift. Vascular: No hyperdense vessel.  Atherosclerotic calcifications. Skull: No calvarial fracture or aggressive osseous lesion.  Sinuses/Orbits: No mass or acute finding within the imaged orbits. No significant paranasal sinus disease at the imaged levels. ASPECTS [facility] Stroke Program Early CT Score) - Ganglionic level infarction (caudate, lentiform nuclei, internal capsule, insula, M1-M3 cortex): 7 - Supraganglionic infarction (M4-M6 cortex): 3 Total score (0-10 with 10 being normal): 10 No evidence of an acute intracranial abnormality. These results were communicated to Dr. Michaela at 1:49 pmon 8/23/2025by text page via the [facility] messaging system. IMPRESSION: 1. No evidence of an acute intracranial abnormality. 2. Parenchymal atrophy and chronic small vessel ischemic disease. Electronically Signed   By: Rockey Childs D.O.   On: 01/30/2024 13:49    ASSESSMENT/PLAN:  Assessment: Principal Problem:   Acute encephalopathy   Plan: Ashley [name] is a 73 y.o. person living with a history of HD MWF, HTN, COPD, HLD, hx CVA with no known deficits who presented with a suspected  stroke and admitted for acute encephalopathy.   Acute encephalopathy Agitation with suspected delirium  Initially ANO x 1 in ED, sluggish to respond to providers' questions.  Code stroke was called.  CT head showed no acute intracranial abnormalities, neurology suspicious for herpes zoster encephalitis given patient's active shingles infection.  Patient on valacyclovir  1g TID started 2 days ago, likely only taking it yesterday after dialysis for 1 full session.  EDP ordered LP to rule out zoster encephalitis, which was negative. Consulted nephrology regarding HD, and noted that patient is taking gabapentin , which can also cause encephalopathy in ESRD patients. Nephrology also considering encephalopathy is secondary to valacyclovir  toxicity given ESRD and dosing. Pending MRI brain and angio neck.  Patient's daughter concerned for dementia/delirium given patient's change in baseline mental status over the last 2 months. Reports more agitation with confusion,  which can be contributing to patient's acute encephalopathy.  Will monitor and place on delirium precautions. Patient may benefit from prescribed antidepressant after resolution of acute illness. - Pending MRI brain and neck - Trend CBCs - Consider starting antidepressant, whether inpatient or outpatient setting -- PT OT evaluation    Hyperkalemia Seemingly has a history of slightly elevated potassium during prior hospitalizations.  Potassium on admission 6.4.  Was given calcium  gluconate, sodium bicarb, and insulin  in ED, which improved potassium to 6.1. EKG sinus rhythm. Consider Lokelma  if patient's potassium does not improve after encephalopathic episode.  - Hold giving Lokelma  for now - Trend BMPs   Hyponatremia Sodium on admission 127, baseline normally within normal limits.  Improved to 130 on repeat lab, will continue to monitor. - Trend BMP   ESRD HD MWF Patient has longstanding history of ESRD with dialysis.  Last dialysis session was Friday, confirmed by nephrology and patient's daughter.  Nephrology consulted, put patient on HD last night. 2L removed. -- HD per nephrology - Strict I's and O's - Daily weights -- Hold all medications until source for acute encephalopathy is located.    COPD Chronic, stable. On home inhalers. Saturating at 93-98% on RA.  - Resume home inhalers   HTN Improving to 150s systolic.  Home medications include amlodipine  10mg  nightly and hydralazine  25mg  TID.  Previously held in the setting of of suspected stroke and acute encephalopathy. Restarted medications after dialysis last night.  -- Continue home medications  HLD Recently increased atorvastatin  dose to 40mg  for longstanding hyperlipidemia and history of CVA.  Last lipid panel 12/31/2023 shows LDL 78, total cholesterol 804, triglycerides 99, HDL 100. -- Resume statin     HX CVA No known residual deficits. Last stroke January 2019.  Not currently on aspirin  treatment.  Best Practice: Diet:  Renal diet IVF: Fluids: none VTE: heparin  injection 5,000 Units Start: 01/30/24 1530 Code: Full  Disposition planning: Therapy Recs: Pending Family Contact: daughter DISPO: Anticipated discharge in 1-2 days to Home pending clinical improvement.  Signature:  Haydee Jabbour [facility]  9:58 AM, 01/31/2024  On Call pager [phone]

## 2024-01-31 NOTE — Hospital Course (Addendum)
 Ashley [name] is a 73 y.o. person living with a history of HD MWF, HTN, COPD, HLD, hx CVA with no known deficits who presented with a suspected stroke and admitted for acute toxic metabolic encephalopathy most likely due to valacyclovir  toxicity.   Acute encephalopathy Agitation with suspected delirium  Initially ANO x 1 in ED, sluggish to respond to questions.  Code stroke was called.  CT head showed no acute intracranial abnormalities and CTA of the head and neck showed no LVO, at that time neurology more concern for medication toxicity versus meningitis/encephalitis.  Patient was started on valacyclovir  1g TID started 2 days before admission for herpes zoster infection.  This was a high dose as she has ESRD. LP done in the ED showed mildly elevated protein and rare lymphocytes/neutrophils but otherwise was normal and meningitis/encephalitis panel was negative.  She received an additional HD session on the night of admission.  The following morning she had improved significantly but was still not at baseline.  Neurology continued stroke workup with MRI and MRA of the brain.  MRI and MRA of the brain were negative for stroke. Her LP, MRA, MRI was negative for stroke so it was concluded that her symptoms were likely due to valacyclovir  toxicity.   BL Neck Pain Not shooting pain with deficits. Likely due to sustained muscle contractions in the neck due to positioning of the bed. Asked to lower head of the bed. Tylenol  and voltaren  gel PRN for pain. Pt's pain was better with voltaren  gel.  --Tylenol  650 mg PRN for symptom relief  --Voltaren  gel in the area of pain   Hyperkalemia--resolved  6.4 on arrival to the ED and received temporizing measures including calcium  gluconate, sodium bicarbonate , and insulin  with dextrose .  Once able to take p.o. meds she was given Lokelma  and before going for additional dialysis session her hyperkalemia had resolved. Current potassium levels were 4.4.    HX CVA There  was concern for stroke on admission however her deficits of initially left-sided facial droop and left-sided weakness that then changed to primary lower extremity weakness or contributed to her encephalopathy.  MRI of the head confirmed slow flow within the cervical left vertebral artery consistent with abnormality seen on the CTA on admission.  MRA also showed high-grade stenosis of the left vertebral artery origin.  MRI of the brain did not show any new strokes.  Last stroke was on January 2019.  Pt was allergic to asprin, so neurology recommended plavix . Started pt on plavix  75 mg daily.  --started Plavix  75 mg    Hyponatremia Sodium on admission 127, baseline normally within normal limits.  Likely not contributory to her encephalopathy and improved during admission. Na levels were back to baseline in the low 130s. Current Na was 133.    ESRD HD MWF No recent missed dialysis sessions and last HD before admission was on 8/22.  Dialyzed on the night of 8/23 and then returned to regular dialysis schedule. Last dialysis session yesterday 02/01/24 --restarted cinacalcet , sevelamer , creon    COPD Stable and home inhalers were continued.   HTN Slightly hypertensive on admission but improved after resuming home medications including amlodipine  10 mg daily and hydralazine  25 mg 3 times daily. BP today was 125/89. Will continue home BP meds.    HLD Recently increased atorvastatin  dose to 40mg  for longstanding hyperlipidemia and history of CVA.  Last lipid panel 12/31/2023 shows LDL 78, total cholesterol 804, triglycerides 99, HDL 100.

## 2024-01-31 NOTE — Evaluation (Signed)
 Physical Therapy Evaluation Patient Details Name: Ashley [name] MRN: [id number] DOB: 1950-08-28 Today's Date: 01/31/2024  History of Present Illness  73 y.o. female presents to [facility] 01/30/24 with L sided weakness, facial droop, and slurred speech, code stroke. CT head negative for acute abnormalities. Pending MRI brain and lumbar puncture. Admitted with acute encephalopathy. PMH including hypertension, pancreatitis, HLD, COPD, ESRD on hemodialysis, CVA, polysubstance abuse.   Clinical Impression  Pt in bed upon arrival and agreeable to PT eval. PTA, pt was ModI with SP cane or rollator for mobility for short distances. In today's session, pt required MaxAx2 for bed mobility and ModAx2 for step-pivot with RW. Pt with decreased initiation throughout stating I can't do it. Noted slight left lateral lean in sitting and standing. Pt intermittently following commands throughout session with encouragement needed for participation. Recommending post-acute rehab <3hrs to work towards independence with mobility. Pt would benefit from acute skilled PT with current functional limitations listed below (see PT Problem List). Acute PT to follow.         If plan is discharge home, recommend the following: A lot of help with walking and/or transfers;A lot of help with bathing/dressing/bathroom;Assistance with cooking/housework;Assist for transportation;Help with stairs or ramp for entrance;Direct supervision/assist for medications management;Direct supervision/assist for financial management   Can travel by private vehicle   No    Equipment Recommendations None recommended by PT     Functional Status Assessment Patient has had a recent decline in their functional status and demonstrates the ability to make significant improvements in function in a reasonable and predictable amount of time.     Precautions / Restrictions Precautions Precautions: Fall Precaution/Restrictions Comments: SBP  90-160 Restrictions Weight Bearing Restrictions Per Provider Order: No      Mobility  Bed Mobility Overal bed mobility: Needs Assistance Bed Mobility: Supine to Sit    Supine to sit: Max assist, +2 for physical assistance    General bed mobility comments: pt not initating    Transfers Overall transfer level: Needs assistance Equipment used: Rolling walker (2 wheels) Transfers: Sit to/from Stand, Bed to chair/wheelchair/BSC Sit to Stand: Mod assist, +2 physical assistance   Step pivot transfers: Mod assist, +2 physical assistance    General transfer comment: Assist to boost up with pt not initiating movement. Took two steps towards recliner before pt stopped moving and responding to commands. ModAx2 to shift hips into recliner due to decreased initiation      Modified Rankin (Stroke Patients Only) Modified Rankin (Stroke Patients Only) Pre-Morbid Rankin Score: Moderate disability Modified Rankin: Moderately severe disability     Balance Overall balance assessment: Needs assistance Sitting-balance support: Feet supported Sitting balance-Leahy Scale: Fair   Postural control: Left lateral lean Standing balance support: During functional activity, Reliant on assistive device for balance Standing balance-Leahy Scale: Poor Standing balance comment: reliant on RW & external support        Pertinent Vitals/Pain Pain Assessment Pain Assessment: No/denies pain    Home Living Family/patient expects to be discharged to:: Private residence Living Arrangements: Children (daughter) Available Help at Discharge: Family;Available 24 hours/day Type of Home: House Home Access: Stairs to enter Entrance Stairs-Rails: Right Entrance Stairs-Number of Steps: flight   Home Layout: One level Home Equipment: Educational psychologist (4 wheels);Cane - Programmer, applications (2 wheels)      Prior Function Prior Level of Function : Needs assist    Mobility Comments: uses RW vs cane ADLs  Comments: SCAT to HD, independent in self care, assisted for IADLs,  daughter works from home     Extremity/Trunk Assessment   Upper Extremity Assessment Upper Extremity Assessment: Defer to OT evaluation    Lower Extremity Assessment Lower Extremity Assessment: Generalized weakness;Difficult to assess due to impaired cognition (not following commands for MMT)    Cervical / Trunk Assessment Cervical / Trunk Assessment: Kyphotic  Communication   Communication Communication: Impaired Factors Affecting Communication: Reduced clarity of speech    Cognition Arousal: Alert Behavior During Therapy: Flat affect   PT - Cognitive impairments: No family/caregiver present to determine baseline, Awareness, Memory, Attention, Sequencing, Problem solving, Safety/Judgement    PT - Cognition Comments: Limited initiation for movement with pt stating I can't do this. Intermittently following commands with pt externally distracted Following commands: Intact       Cueing Cueing Techniques: Verbal cues, Gestural cues     General Comments General comments (skin integrity, edema, etc.): VSS        PT Assessment Patient needs continued PT services  PT Problem List Decreased strength;Decreased activity tolerance;Decreased balance;Decreased mobility;Decreased cognition;Decreased knowledge of use of DME;Decreased safety awareness       PT Treatment Interventions DME instruction;Gait training;Functional mobility training;Therapeutic activities;Therapeutic exercise;Balance training;Neuromuscular re-education;Patient/family education    PT Goals (Current goals can be found in the Care Plan section)  Acute Rehab PT Goals Patient Stated Goal: to get stronger PT Goal Formulation: With patient Time For Goal Achievement: 02/14/24 Potential to Achieve Goals: Good    Frequency Min 2X/week     Co-evaluation PT/OT/SLP Co-Evaluation/Treatment: Yes Reason for Co-Treatment: For patient/therapist  safety;To address functional/ADL transfers;Necessary to address cognition/behavior during functional activity PT goals addressed during session: Mobility/safety with mobility;Balance;Proper use of DME OT goals addressed during session: ADL's and self-care;Strengthening/ROM       AM-PAC PT 6 Clicks Mobility  Outcome Measure Help needed turning from your back to your side while in a flat bed without using bedrails?: Total Help needed moving from lying on your back to sitting on the side of a flat bed without using bedrails?: Total Help needed moving to and from a bed to a chair (including a wheelchair)?: Total Help needed standing up from a chair using your arms (e.g., wheelchair or bedside chair)?: Total Help needed to walk in hospital room?: Total Help needed climbing 3-5 steps with a railing? : Total 6 Click Score: 6    End of Session Equipment Utilized During Treatment: Gait belt Activity Tolerance: Patient tolerated treatment well Patient left: in chair;with call bell/phone within reach;with chair alarm set Nurse Communication: Mobility status PT Visit Diagnosis: Unsteadiness on feet (R26.81);Other abnormalities of gait and mobility (R26.89);Muscle weakness (generalized) (M62.81)    Time: 9169-9151 PT Time Calculation (min) (ACUTE ONLY): 18 min   Charges:   PT Evaluation $PT Eval Low Complexity: 1 Low   PT General Charges $$ ACUTE PT VISIT: 1 Visit       Kate ORN, PT, DPT Secure Chat Preferred  Rehab Office [phone]   Kate BRAVO Wendolyn 01/31/2024, 10:11 AM

## 2024-01-31 NOTE — Progress Notes (Signed)
 NEUROLOGY CONSULT FOLLOW UP NOTE   Date of service: January 31, 2024 Patient Name: Ashley [name] MRN:  [id number] DOB:  05-24-1951  Interval Hx/subjective   She still feels like her speech is more slurred than normal, but she is better than yesterday Vitals   Vitals:   01/31/24 0750 01/31/24 1146 01/31/24 1200 01/31/24 1519  BP: (!) 150/67  (!) 151/66 137/65  Pulse: 90  87 92  Resp: 16  19 19   Temp: 98.5 F (36.9 C)  98.4 F (36.9 C) 98.8 F (37.1 C)  TempSrc: Oral  Oral Oral  SpO2: 93% 98% 98% 94%  Weight:      Height:         Body mass index is 18.23 kg/m.  Physical Exam   Constitutional: Appears well-developed and well-nourished.  Neurologic Examination    MS: Awake, alert, oriented CN: Visual fields full, EOMI Motor: Moves all extremities well Sensory: Intact to light touch  Medications  Current Facility-Administered Medications:    albuterol  (PROVENTIL ) (2.5 MG/3ML) 0.083% nebulizer solution 2.5 mg, 2.5 mg, Nebulization, Q6H PRN, Rosan, Erik C, DO   amLODipine  (NORVASC ) tablet 10 mg, 10 mg, Oral, QHS, Amilibia, Jaden, DO   atorvastatin  (LIPITOR) tablet 40 mg, 40 mg, Oral, Daily, Amilibia, Jaden, DO, 40 mg at 01/31/24 9070   budesonide -glycopyrrolate -formoterol  (BREZTRI ) 160-9-4.8 MCG/ACT inhaler 2 puff, 2 puff, Inhalation, BID, Amilibia, Jaden, DO, 2 puff at 01/31/24 1141   Chlorhexidine  Gluconate Cloth 2 % PADS 6 each, 6 each, Topical, Q0600, Geralynn Charleston, MD, 6 each at 01/31/24 0551   diclofenac  Sodium (VOLTAREN ) 1 % topical gel 4 g, 4 g, Topical, QID, Amilibia, Jaden, DO, 4 g at 01/31/24 1419   heparin  injection 5,000 Units, 5,000 Units, Subcutaneous, Q8H, Jolaine Pac, DO, 5,000 Units at 01/31/24 1419   hydrALAZINE  (APRESOLINE ) tablet 75 mg, 75 mg, Oral, Q8H, Amilibia, Jaden, DO, 75 mg at 01/31/24 1419   lipase/protease/amylase (CREON ) capsule 24,000 Units, 24,000 Units, Oral, BID BM PRN, Rosan Dayton BROCKS, DO   lipase/protease/amylase (CREON ) capsule  48,000 Units, 48,000 Units, Oral, TID with meals, Amilibia, Jaden, DO, 48,000 Units at 01/31/24 1233   multivitamin (RENA-VIT) tablet 1 tablet, 1 tablet, Oral, QHS, Hoffman, Erik C, DO   pantoprazole  (PROTONIX ) EC tablet 40 mg, 40 mg, Oral, BID, Amilibia, Jaden, DO, 40 mg at 01/31/24 0930   sevelamer  carbonate (RENVELA ) tablet 800 mg, 800 mg, Oral, TID WC, Amilibia, Jaden, DO, 800 mg at 01/31/24 1234  Labs and Diagnostic Imaging   Results for orders placed or performed during the hospital encounter of 01/30/24 (from the past 24 hours)  Renal function panel     Status: Abnormal   Collection Time: 01/30/24  7:08 PM  Result Value Ref Range   Sodium 130 (L) 135 - 145 mmol/L   Potassium 5.3 (H) 3.5 - 5.1 mmol/L   Chloride 89 (L) 98 - 111 mmol/L   CO2 25 22 - 32 mmol/L   Glucose, Bld 81 70 - 99 mg/dL   BUN 26 (H) 8 - 23 mg/dL   Creatinine, Ser 2.64 (H) 0.44 - 1.00 mg/dL   Calcium  9.2 8.9 - 10.3 mg/dL   Phosphorus 7.1 (H) 2.5 - 4.6 mg/dL   Albumin  3.2 (L) 3.5 - 5.0 g/dL   GFR, Estimated 5 (L) >60 mL/min   Anion gap 16 (H) 5 - 15  Hepatitis B surface antigen     Status: None   Collection Time: 01/30/24  7:08 PM  Result Value Ref Range  Hepatitis B Surface Ag NON REACTIVE NON REACTIVE  CBC with Differential/Platelet     Status: Abnormal   Collection Time: 01/31/24  4:02 AM  Result Value Ref Range   WBC 4.9 4.0 - 10.5 K/uL   RBC 4.23 3.87 - 5.11 MIL/uL   Hemoglobin 12.2 12.0 - 15.0 g/dL   HCT 62.8 63.9 - 53.9 %   MCV 87.7 80.0 - 100.0 fL   MCH 28.8 26.0 - 34.0 pg   MCHC 32.9 30.0 - 36.0 g/dL   RDW 81.5 (H) 88.4 - 84.4 %   Platelets 213 150 - 400 K/uL   nRBC 0.0 0.0 - 0.2 %   Neutrophils Relative % 59 %   Neutro Abs 3.0 1.7 - 7.7 K/uL   Lymphocytes Relative 25 %   Lymphs Abs 1.2 0.7 - 4.0 K/uL   Monocytes Relative 13 %   Monocytes Absolute 0.6 0.1 - 1.0 K/uL   Eosinophils Relative 2 %   Eosinophils Absolute 0.1 0.0 - 0.5 K/uL   Basophils Relative 1 %   Basophils Absolute 0.0  0.0 - 0.1 K/uL   Immature Granulocytes 0 %   Abs Immature Granulocytes 0.01 0.00 - 0.07 K/uL  Renal function panel     Status: Abnormal   Collection Time: 01/31/24  4:02 AM  Result Value Ref Range   Sodium 134 (L) 135 - 145 mmol/L   Potassium 3.7 3.5 - 5.1 mmol/L   Chloride 94 (L) 98 - 111 mmol/L   CO2 28 22 - 32 mmol/L   Glucose, Bld 76 70 - 99 mg/dL   BUN 7 (L) 8 - 23 mg/dL   Creatinine, Ser 5.90 (H) 0.44 - 1.00 mg/dL   Calcium  9.4 8.9 - 10.3 mg/dL   Phosphorus 4.5 2.5 - 4.6 mg/dL   Albumin  3.2 (L) 3.5 - 5.0 g/dL   GFR, Estimated 11 (L) >60 mL/min   Anion gap 12 5 - 15  Ammonia     Status: None   Collection Time: 01/31/24  4:02 AM  Result Value Ref Range   Ammonia 34 9 - 35 umol/L  TSH     Status: None   Collection Time: 01/31/24  4:02 AM  Result Value Ref Range   TSH 3.312 0.350 - 4.500 uIU/mL  Vitamin B12     Status: None   Collection Time: 01/31/24  4:02 AM  Result Value Ref Range   Vitamin B-12 477 180 - 914 pg/mL     Imaging(Personally reviewed): MRI brain-negative, MRA suggestive of 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 female presenting with acute encephalopathy and slurred speech in the setting of Valtrex  for shingles flare.  With negative LP, and negative MRI, I think this most likely does represent Valtrex  toxicity.  Shingles can cause delirium and predisposed individuals as well, so this could be a consideration but my suspicion is fairly strong that this is more likely related to her Valtrex .  I do not think that her vertebral stenosis is symptomatic, but I would favor antiplatelet therapy with aspirin  as well as lipid management.  Recommendations  Aspirin  81 mg daily Continue atorvastatin  40 mg daily Hold acyclovir Neurology will be available as needed. ______________________________________________________________________   Bonney Aisha Seals, MD [facility]

## 2024-01-31 NOTE — Evaluation (Signed)
 Occupational Therapy Evaluation Patient Details Name: Ashley [name] MRN: [id number] DOB: June 05, 1951 Today's Date: 01/31/2024   History of Present Illness   Pt is a 73 y/o F presenting to ED on 8/23 with AMS, concern for CVA, slurred speech, L weakness, and L facial droop. Recently started on Valacylcovir in the setting of ESRD. CT head negative for CVA. MRI pending. PMH includes CVA, ESRD on HD MWF, HTN, COPD, HLD     Clinical Impressions Pt reports living with her daughter, uses a RW v cane for mobility and is ind with ADLs. Pt utilizes SCAT to get to/from HD and daughter assists with IADLs. Pt currently needing up to mod A for ADLs, max +2 for bed mobility and mod +2 for transfers with RW. Pt with decr initiation for mobility tasks, noted to have L lateral lean in sitting/standing, heavy reliance on RW and external assist. Pt presenting with impairments listed below, will follow acutely. Patient will benefit from continued inpatient follow up therapy, <3 hours/day to maximize safety/ind with ADL/functional mobility.      If plan is discharge home, recommend the following:   Two people to help with walking and/or transfers;A lot of help with bathing/dressing/bathroom;Assistance with cooking/housework;Direct supervision/assist for medications management;Direct supervision/assist for financial management;Assist for transportation;Help with stairs or ramp for entrance;Supervision due to cognitive status     Functional Status Assessment   Patient has had a recent decline in their functional status and demonstrates the ability to make significant improvements in function in a reasonable and predictable amount of time.     Equipment Recommendations   Other (comment) (defer)     Recommendations for Other Services   PT consult     Precautions/Restrictions   Precautions Precautions: Fall Precaution/Restrictions Comments: SBP 90-160 Restrictions Weight Bearing Restrictions Per  Provider Order: No     Mobility Bed Mobility Overal bed mobility: Needs Assistance Bed Mobility: Supine to Sit     Supine to sit: Max assist, +2 for physical assistance     General bed mobility comments: pt not initating    Transfers Overall transfer level: Needs assistance Equipment used: Rolling walker (2 wheels) Transfers: Sit to/from Stand, Bed to chair/wheelchair/BSC Sit to Stand: Mod assist, +2 physical assistance     Step pivot transfers: Mod assist, +2 physical assistance            Balance Overall balance assessment: Needs assistance Sitting-balance support: Feet supported Sitting balance-Leahy Scale: Fair   Postural control: Left lateral lean Standing balance support: During functional activity, Reliant on assistive device for balance Standing balance-Leahy Scale: Poor Standing balance comment: reliant on RW & external support                           ADL either performed or assessed with clinical judgement   ADL Overall ADL's : Needs assistance/impaired Eating/Feeding: Minimal assistance;Sitting   Grooming: Minimal assistance;Sitting   Upper Body Bathing: Moderate assistance;Sitting   Lower Body Bathing: Moderate assistance;Bed level;Sitting/lateral leans   Upper Body Dressing : Moderate assistance;Sitting   Lower Body Dressing: Moderate assistance;Sitting/lateral leans   Toilet Transfer: Moderate assistance;+2 for physical assistance   Toileting- Clothing Manipulation and Hygiene: Moderate assistance       Functional mobility during ADLs: Moderate assistance;+2 for physical assistance;Rolling walker (2 wheels)       Vision   Additional Comments: needs further assessment     Perception Perception: Not tested       Praxis Praxis:  Not tested       Pertinent Vitals/Pain Pain Assessment Pain Assessment: No/denies pain     Extremity/Trunk Assessment Upper Extremity Assessment Upper Extremity Assessment: Generalized  weakness (pt with limited attempt at shoulder flexion)   Lower Extremity Assessment Lower Extremity Assessment: Defer to PT evaluation   Cervical / Trunk Assessment Cervical / Trunk Assessment: Kyphotic   Communication Communication Communication: Impaired Factors Affecting Communication: Reduced clarity of speech   Cognition Arousal: Alert Behavior During Therapy: Flat affect Cognition: Cognition impaired   Orientation impairments: Situation, Place Awareness: Online awareness impaired     Executive functioning impairment (select all impairments): Initiation OT - Cognition Comments: hesitant to participate                 Following commands: Intact       Cueing  General Comments   Cueing Techniques: Verbal cues;Gestural cues  VSS   Exercises     Shoulder Instructions      Home Living Family/patient expects to be discharged to:: Private residence Living Arrangements: Children (daughter) Available Help at Discharge: Family;Available 24 hours/day Type of Home: House Home Access: Stairs to enter [employer] of Steps: flight Entrance Stairs-Rails: Right Home Layout: One level     Bathroom Shower/Tub: Chief Strategy Officer: Standard     Home Equipment: Educational psychologist (4 wheels);Cane - quad          Prior Functioning/Environment Prior Level of Function : Needs assist             Mobility Comments: uses RW vs cane ADLs Comments: SCAT to HD, independent in self care, assisted for IADLs, daughter works from home    OT Problem List: Decreased strength;Decreased range of motion;Decreased activity tolerance;Impaired balance (sitting and/or standing);Decreased cognition;Decreased safety awareness;Decreased coordination   OT Treatment/Interventions: Self-care/ADL training;Therapeutic exercise;Energy conservation;DME and/or AE instruction;Neuromuscular education;Therapeutic activities;Patient/family education;Balance  training;Visual/perceptual remediation/compensation;Cognitive remediation/compensation      OT Goals(Current goals can be found in the care plan section)   Acute Rehab OT Goals Patient Stated Goal: none stated OT Goal Formulation: With patient Time For Goal Achievement: 02/14/24 Potential to Achieve Goals: Good ADL Goals Pt Will Perform Grooming: with contact guard assist;standing Pt Will Perform Upper Body Dressing: with contact guard assist;sitting Pt Will Perform Lower Body Dressing: with min assist;sit to/from stand;sitting/lateral leans Pt Will Transfer to Toilet: with min assist;stand pivot transfer;bedside commode Additional ADL Goal #1: pt will perform bed mobility CGA in prep for seated ADLs   OT Frequency:  Min 2X/week    Co-evaluation PT/OT/SLP Co-Evaluation/Treatment: Yes Reason for Co-Treatment: For patient/therapist safety;To address functional/ADL transfers;Necessary to address cognition/behavior during functional activity PT goals addressed during session: Mobility/safety with mobility;Balance;Proper use of DME OT goals addressed during session: ADL's and self-care;Strengthening/ROM      AM-PAC OT 6 Clicks Daily Activity     Outcome Measure Help from another person eating meals?: A Little Help from another person taking care of personal grooming?: A Little Help from another person toileting, which includes using toliet, bedpan, or urinal?: A Lot Help from another person bathing (including washing, rinsing, drying)?: A Lot Help from another person to put on and taking off regular upper body clothing?: A Lot Help from another person to put on and taking off regular lower body clothing?: A Lot 6 Click Score: 14   End of Session Equipment Utilized During Treatment: Gait belt;Rolling walker (2 wheels) Nurse Communication: Mobility status  Activity Tolerance: Patient tolerated treatment well Patient left: in chair;with call  bell/phone within reach;with chair  alarm set  OT Visit Diagnosis: Unsteadiness on feet (R26.81);Other abnormalities of gait and mobility (R26.89);Muscle weakness (generalized) (M62.81)                Time: 9169-9151 OT Time Calculation (min): 18 min Charges:  OT General Charges $OT Visit: 1 Visit OT Evaluation $OT Eval Moderate Complexity: 1 Mod  Matthe Sloane K, OTD, OTR/L SecureChat Preferred Acute Rehab [phone]   Ressie Slevin K Koonce 01/31/2024, 10:00 AM

## 2024-01-31 NOTE — Progress Notes (Signed)
  [facility] Progress Note   Subjective: Seen and examined in room. Completed dialysis yesterday - no issues. She's alert, oriented x 3 this morning.   Objective Vitals:   01/31/24 0036 01/31/24 0418 01/31/24 0500 01/31/24 0750  BP: (!) 149/72 (!) 158/68  (!) 150/67  Pulse: 94 96  90  Resp: (!) 25 18  16   Temp: 98.7 F (37.1 C) 99.1 F (37.3 C)  98.5 F (36.9 C)  TempSrc: Oral Oral  Oral  SpO2: 100% 98%  93%  Weight:   45.2 kg   Height:        Additional Objective Labs: Basic Metabolic Panel: Recent Labs  Lab 01/30/24 1408 01/30/24 1908 01/31/24 0402  NA 130* 130* 134*  K 6.1* 5.3* 3.7  CL 92* 89* 94*  CO2 24 25 28   GLUCOSE 91 81 76  BUN 24* 26* 7*  CREATININE 7.07* 7.35* 4.09*  CALCIUM  9.5 9.2 9.4  PHOS  --  7.1* 4.5   CBC: Recent Labs  Lab 01/30/24 1343 01/30/24 1408 01/31/24 0402  WBC  --  4.7 4.9  NEUTROABS  --  2.2 3.0  HGB 15.6* 12.9 12.2  HCT 46.0 41.0 37.1  MCV  --  90.5 87.7  PLT  --  224 213   Blood Culture    Component Value Date/Time   SDES CSF 01/30/2024 1608   SPECREQUEST NONE 01/30/2024 1608   CULT  01/30/2024 1608    NO GROWTH < 24 HOURS Performed at [facility], [address], [place], [place] [address]    REPTSTATUS PENDING 01/30/2024 1608     Physical Exam General: Alert, sitting up in recliner, nad Heart: RRR Lungs: Normal wob Abdomen: non tender  Extremities: no LE edema  Dialysis Access: AVF +bruit   Medications:   amLODipine   10 mg Oral QHS   atorvastatin   40 mg Oral Daily   budesonide -glycopyrrolate -formoterol   2 puff Inhalation BID   Chlorhexidine  Gluconate Cloth  6 each Topical Q0600   diclofenac  Sodium  4 g Topical QID   heparin   5,000 Units Subcutaneous Q8H   hydrALAZINE   75 mg Oral Q8H   lipase/protease/amylase  48,000 Units Oral TID with meals   multivitamin  1 tablet Oral QHS   pantoprazole   40 mg Oral BID   sevelamer  carbonate  800 mg Oral TID WC    Dialysis Orders:   [facility]  MWF 3.5h   B350   43.9kg   2K bath AVF  Heparin  none Last OP HD 8/22, post wt 44.2 Gets to dry wt IDWG 1-3 kg  Assessment/Plan: Altered mental status: Felt to be secondary to Valacyclovir  toxicity and/or HSV infection. S/p lumbar puncture --CSF fld ngtd. Improved.  Hyperkalemia: Resolved with HD. K 3.7.  ESRD: on HD MWF. Had urgent HD Saturday for above. Next HD Monday.  HTN: BP elevated. Continue home meds.  Volume: gets to dry wt, low wt gains at OP unit. No vol excess today on exam.  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 No esa needs.   COPD H/o CVA  Balthazar Dooly Ronnald Acosta PA-C [facility] 01/31/2024,11:27 AM

## 2024-01-31 NOTE — Plan of Care (Signed)
  Problem: Health Behavior/Discharge Planning: Goal: Ability to manage health-related needs will improve Outcome: Progressing   Problem: Clinical Measurements: Goal: Will remain free from infection Outcome: Progressing   Problem: Clinical Measurements: Goal: Respiratory complications will improve Outcome: Progressing   Problem: Nutrition: Goal: Adequate nutrition will be maintained Outcome: Progressing   Problem: Coping: Goal: Level of anxiety will decrease Outcome: Progressing   Problem: Elimination: Goal: Will not experience complications related to urinary retention Outcome: Progressing   Problem: Safety: Goal: Ability to remain free from injury will improve Outcome: Progressing   Problem: Skin Integrity: Goal: Risk for impaired skin integrity will decrease Outcome: Progressing

## 2024-01-31 NOTE — Plan of Care (Signed)
 Sat up in the chair for a couple hours today.  Did attempt to get up out of the chair and said was going home.  Was reminded that she is in the hospital and is unable to get up unassisted.  Requires frequent redirecting.    Problem: Activity: Goal: Risk for activity intolerance will decrease Outcome: Progressing   Problem: Nutrition: Goal: Adequate nutrition will be maintained Outcome: Progressing   Problem: Coping: Goal: Level of anxiety will decrease Outcome: Progressing   Problem: Education: Goal: Knowledge of disease or condition will improve Outcome: Progressing   Problem: Education: Goal: Knowledge of secondary prevention will improve (MUST DOCUMENT ALL) Outcome: Progressing   Problem: Education: Goal: Knowledge of patient specific risk factors will improve (DELETE if not current risk factor) Outcome: Progressing

## 2024-01-31 NOTE — Progress Notes (Signed)
 Pt is off from unit for Dialysis, transported via bed.

## 2024-02-01 ENCOUNTER — Telehealth: Payer: Self-pay | Admitting: *Deleted

## 2024-02-01 DIAGNOSIS — Z7902 Long term (current) use of antithrombotics/antiplatelets: Secondary | ICD-10-CM

## 2024-02-01 DIAGNOSIS — Z886 Allergy status to analgesic agent status: Secondary | ICD-10-CM

## 2024-02-01 DIAGNOSIS — M542 Cervicalgia: Secondary | ICD-10-CM

## 2024-02-01 LAB — RENAL FUNCTION PANEL
Albumin: 3 g/dL — ABNORMAL LOW (ref 3.5–5.0)
Anion gap: 14 (ref 5–15)
BUN: 20 mg/dL (ref 8–23)
CO2: 26 mmol/L (ref 22–32)
Calcium: 9.3 mg/dL (ref 8.9–10.3)
Chloride: 93 mmol/L — ABNORMAL LOW (ref 98–111)
Creatinine, Ser: 7.13 mg/dL — ABNORMAL HIGH (ref 0.44–1.00)
GFR, Estimated: 6 mL/min — ABNORMAL LOW (ref >60)
Glucose, Bld: 89 mg/dL (ref 70–99)
Phosphorus: 7.4 mg/dL — ABNORMAL HIGH (ref 2.5–4.6)
Potassium: 4.4 mmol/L (ref 3.5–5.1)
Sodium: 133 mmol/L — ABNORMAL LOW (ref 135–145)

## 2024-02-01 LAB — CBC
HCT: 35.1 % — ABNORMAL LOW (ref 36.0–46.0)
Hemoglobin: 11.6 g/dL — ABNORMAL LOW (ref 12.0–15.0)
MCH: 28.6 pg (ref 26.0–34.0)
MCHC: 33 g/dL (ref 30.0–36.0)
MCV: 86.5 fL (ref 80.0–100.0)
Platelets: 255 K/uL (ref 150–400)
RBC: 4.06 MIL/uL (ref 3.87–5.11)
RDW: 17.8 % — ABNORMAL HIGH (ref 11.5–15.5)
WBC: 4.6 K/uL (ref 4.0–10.5)
nRBC: 0 % (ref 0.0–0.2)

## 2024-02-01 MED ORDER — ACETAMINOPHEN 325 MG PO TABS
650.0000 mg | ORAL_TABLET | Freq: Four times a day (QID) | ORAL | Status: DC | PRN
  Administered 2024-02-01 – 2024-02-02 (×3): 650 mg via ORAL
  Filled 2024-02-01 (×3): qty 2

## 2024-02-01 MED ORDER — CLOPIDOGREL BISULFATE 75 MG PO TABS
75.0000 mg | ORAL_TABLET | Freq: Every day | ORAL | Status: DC
  Administered 2024-02-01 – 2024-02-02 (×2): 75 mg via ORAL
  Filled 2024-02-01 (×2): qty 1

## 2024-02-01 NOTE — Progress Notes (Signed)
  [facility] Progress Note   Dialysis Orders:   [facility] MWF 3.5h   B350   43.9kg   2K bath AVF  Heparin  none Last OP HD 8/22, post wt 44.2 Gets to dry wt IDWG 1-3 kg  Assessment/Plan: Altered mental status: Felt to be secondary to Valacyclovir  toxicity and/or HSV infection. S/p lumbar puncture --CSF fld ngtd. Improved.  Complaining of neck pain that kept her up overnight Hyperkalemia: Resolved with HD. K 3.7.  ESRD: on HD MWF. Had urgent HD Saturday for above. Next HD today for 3hrs; very small, emaciated with limited muscle mass.  HTN: BP elevated. Continue home meds.  Volume: gets to dry wt, low wt gains at OP unit. No vol excess today on exam.  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 No esa needs.   COPD H/o CVA   Subjective: Seen and examined in room.  Last dialysis Saturday- no issues. She's alert, oriented x 3 this morning but complaining of neck pain.  Denies any fever, chills, nausea, vomiting  Objective Vitals:   02/01/24 0420 02/01/24 0500 02/01/24 0757 02/01/24 0828  BP: (!) 149/68  (!) 140/66   Pulse: 88  83 83  Resp: 16  15 15   Temp: 99.2 F (37.3 C)  98.7 F (37.1 C)   TempSrc: Oral  Oral   SpO2: 100%  96% 96%  Weight:  45.2 kg    Height:        Additional Objective Labs: Basic Metabolic Panel: Recent Labs  Lab 01/30/24 1908 01/31/24 0402 02/01/24 0449  NA 130* 134* 133*  K 5.3* 3.7 4.4  CL 89* 94* 93*  CO2 25 28 26   GLUCOSE 81 76 89  BUN 26* 7* 20  CREATININE 7.35* 4.09* 7.13*  CALCIUM  9.2 9.4 9.3  PHOS 7.1* 4.5 7.4*   CBC: Recent Labs  Lab 01/30/24 1408 01/31/24 0402 02/01/24 0449  WBC 4.7 4.9 4.6  NEUTROABS 2.2 3.0  --   HGB 12.9 12.2 11.6*  HCT 41.0 37.1 35.1*  MCV 90.5 87.7 86.5  PLT 224 213 255   Blood Culture    Component Value Date/Time   SDES CSF 01/30/2024 1608   SPECREQUEST NONE 01/30/2024 1608   CULT  01/30/2024 1608    NO GROWTH < 24 HOURS Performed at [facility], [address] [place], [place] [address]     REPTSTATUS PENDING 01/30/2024 1608     Physical Exam General: Alert, sitting up in recliner, nad Heart: RRR Lungs: Normal wob Abdomen: non tender  Extremities: no LE edema  Dialysis Access: lt BBT w/ outflow stent +bruit   Medications:   amLODipine   10 mg Oral QHS   atorvastatin   40 mg Oral Daily   budesonide -glycopyrrolate -formoterol   2 puff Inhalation BID   Chlorhexidine  Gluconate Cloth  6 each Topical Q0600   diclofenac  Sodium  4 g Topical QID   heparin   5,000 Units Subcutaneous Q8H   hydrALAZINE   75 mg Oral Q8H   lipase/protease/amylase  48,000 Units Oral TID with meals   multivitamin  1 tablet Oral QHS   pantoprazole   40 mg Oral BID   sevelamer  carbonate  800 mg Oral TID WC

## 2024-02-01 NOTE — Progress Notes (Addendum)
 HD#2 SUBJECTIVE:  Patient Summary: Ashley [name] is a 73 y.o. with a pertinent PMH of ESRD on HD MWF, HTN, COPD, HLD, hx CVA 2019 with no known deficits who presented with suspected stroke and admitted for acute encephalopathy.   Overnight Events: none  Interim History: Patient evaluated at bedside this morning. Her neck muscles were feeling tight on either side starting this morning. She was alert and oriented. She denied any other complaints     OBJECTIVE:  Vital Signs: Vitals:   01/31/24 1200 01/31/24 1519 01/31/24 2005 02/01/24 0420  BP: (!) 151/66 137/65 137/64 (!) 149/68  Pulse: 87 92 91 88  Resp: 19 19 20 16   Temp: 98.4 F (36.9 C) 98.8 F (37.1 C) 98.7 F (37.1 C) 99.2 F (37.3 C)  TempSrc: Oral Oral Oral Oral  SpO2: 98% 94% 97% 100%  Weight:      Height:       Supplemental O2: Room Air   Filed Weights   01/30/24 1355 01/30/24 2000 01/31/24 0500  Weight: 45.8 kg 46.9 kg 45.2 kg     Intake/Output Summary (Last 24 hours) at 02/01/2024 0612 Last data filed at 01/31/2024 1400 Gross per 24 hour  Intake 480 ml  Output --  Net 480 ml   Net IO Since Admission: -1,400 mL [02/01/24 0612]  Physical Exam: Physical Exam HENT:     Head: Normocephalic.  Pulmonary:     Effort: Pulmonary effort is normal.  Skin:    Comments: Rash noted on the left side of the neck and left upper chest.   Neurological:     Mental Status: She is alert.     Patient Lines/Drains/Airways Status     Active Line/Drains/Airways     Name Placement date Placement time Site Days   Peripheral IV 01/30/24 18 G Right Antecubital 01/30/24  1400  Antecubital  2   Fistula / Graft Left Upper arm Arteriovenous fistula --  --  Upper arm  --   Wound 11/19/23 1249 Surgical Closed Surgical Incision Arm Right 11/19/23  1249  Arm  74            Pertinent labs and imaging:      Latest Ref Rng & Units 02/01/2024    4:49 AM 01/31/2024    4:02 AM 01/30/2024    2:08 PM  CBC  WBC 4.0 - 10.5 K/uL  4.6  4.9  4.7   Hemoglobin 12.0 - 15.0 g/dL 88.3  87.7  87.0   Hematocrit 36.0 - 46.0 % 35.1  37.1  41.0   Platelets 150 - 400 K/uL 255  213  224        Latest Ref Rng & Units 02/01/2024    4:49 AM 01/31/2024    4:02 AM 01/30/2024    7:08 PM  CMP  Glucose 70 - 99 mg/dL 89  76  81   BUN 8 - 23 mg/dL 20  7  26    Creatinine 0.44 - 1.00 mg/dL 2.86  5.90  2.64   Sodium 135 - 145 mmol/L 133  134  130   Potassium 3.5 - 5.1 mmol/L 4.4  3.7  5.3   Chloride 98 - 111 mmol/L 93  94  89   CO2 22 - 32 mmol/L 26  28  25    Calcium  8.9 - 10.3 mg/dL 9.3  9.4  9.2     MR ANGIO NECK WO CONTRAST Result Date: 01/31/2024 CLINICAL DATA:  Neuro deficit, acute, stroke suspected. EXAM: MRA  NECK WITHOUT CONTRAST TECHNIQUE: Angiographic images of the neck were acquired using MRA technique without intravenous contrast. Carotid stenosis measurements (when applicable) are obtained utilizing NASCET criteria, using the distal internal carotid diameter as the denominator. COMPARISON:  CT angiogram head/neck 01/30/2024. FINDINGS: The aortic arch and proximal major branch vessels of the neck are excluded from the field of view. The proximal common carotid and vertebral arteries are also excluded from field of view. Within these limitations, findings are as follows. Aortic arch: Excluded from the field of view. Right carotid system: CCA and ICA patent within neck at the imaged levels. 40% atherosclerotic narrowing of the proximal ICA, better appreciated on yesterday's CTA. Left carotid system: CCA and ICA patent within the neck at the imaged levels without stenosis. Scattered atherosclerotic plaque within the CCA, about the carotid bifurcation and within the proximal ICA, better appreciated on yesterday's CTA. Vertebral arteries: The right vertebral artery is patent within the neck at the imaged levels without stenosis. There is no appreciable flow related signal within the cervical left vertebral artery at the imaged levels.  IMPRESSION: 1. The aortic arch and proximal major branch vessels of the neck are excluded from the field of view. The proximal common carotid and vertebral arteries are also excluded from field of view. Within these limitations, findings are as follows. 2. Absent flow related signal within the cervical left vertebral artery at the imaged levels. Correlating with yesterday's CTA, findings suggest very slow flow within the cervical left vertebral artery. These findings also suggest that there is a high-grade stenosis at the left vertebral artery origin which was not appreciable on yesterday's CTA. 3. The common carotid and internal carotids are patent within the neck at the imaged levels. 40% atherosclerotic stenosis of the proximal right ICA, better appreciated on yesterday's CTA. Non-stenotic atherosclerotic plaque within the left CCA and cervical ICA, also better appreciated on yesterday's CTA. Electronically Signed   By: Rockey Childs D.O.   On: 01/31/2024 11:30   MR BRAIN WO CONTRAST Result Date: 01/31/2024 CLINICAL DATA:  Provided history: Neuro deficit, acute, stroke suspected. Delirium. EXAM: MRI HEAD WITHOUT CONTRAST TECHNIQUE: Multiplanar, multiecho pulse sequences of the brain and surrounding structures were obtained without intravenous contrast. COMPARISON:  Non-contrast head CT and CT angiogram head/neck 01/30/2024. Brain MRI 12/22/2023. FINDINGS: Brain: Generalized cerebral atrophy. Multifocal T2 FLAIR hyperintense signal abnormality within the cerebral white matter, nonspecific but compatible with advanced chronic small vessel ischemic disease. Mild chronic small vessel ischemic changes within the pons. There is no acute infarct. No evidence of an intracranial mass. No chronic intracranial blood products. No extra-axial fluid collection. No midline shift. Vascular: Maintained flow voids within the proximal large arterial vessels. Skull and upper cervical spine: No focal worrisome marrow lesion.  Incompletely assessed cervical spondylosis. Sinuses/Orbits: No mass or acute finding within the imaged orbits. No significant paranasal sinus disease. IMPRESSION: 1.  No evidence of an acute intracranial abnormality. 2. Chronic small vessel ischemic changes which are advanced in the cerebral white matter and mild in the pons. Findings are similar to the prior MRI of 12/22/2023. 3. Generalized cerebral atrophy. Electronically Signed   By: Rockey Childs D.O.   On: 01/31/2024 11:08    ASSESSMENT/PLAN:  Assessment: Principal Problem:   Acute encephalopathy    Plan: Encephalopathy:  Pt was initially evaluated for stroke in the ED due to left sided-deficits. Her stroke work-up was negative in the ED. She was recently started on a high dose valacyclovir  1g TID before her symptoms  began. Her symptoms were likely due to valacyclovir  toxicity as she is on ESRD.  LP results were negative. MR head and neck was negative for acute intracranial abnormality.  -  MRI brain and neck negative for stroke -LP and CSF gram stain negative  - Trend CBCs - Consider starting antidepressant, whether inpatient or outpatient setting -- PT OT evaluation  BL Neck Pain Not shooting pain with deficits. Likely due to sustained muscle contractions in the neck due to positioning of the bed. Asked to lower head of the bed. Tylenol  and voltaren  gel PRN for pain. --Tylenol  650 mg PRN for symptom relief  --Voltaren  gel in the area of pain  Hyerkalemia: resolved  K was 4.4 today. K was 6.4 and treated with calcium  gluconate, sodium bicarb, and insulin  in ED  --trend: bmp  Hyponatremia Sodium on admission 127, baseline normally within normal limits.  Levels improved and ar 133 today, will continue to monitor. - Trend BMP  ESRD: Last on Saturday per nephro: 2 L removed. Dialysis due today.  - Strict I's and O's - Daily weights -- restarted Cinacalcet  --restarted Sevelamer  --restarted Creon   COPD Chronic, stable. On  home inhalers. Currently on RA  - Resumed home inhalers: albuterol , breztri   HTN BP today:140/66. Started home BP meds amlodipine  and hydralazine : --start Amlodipine  10mg  nightly  --start hydralazine  25mg  TID  HLD Recently increased atorvastatin  dose to 40mg  for longstanding hyperlipidemia and history of CVA.  Last lipid panel 12/31/2023 shows LDL 78, total cholesterol 804, triglycerides 99, HDL 100. -- Restarted atorvastatin  40 mg    HX CVA No known residual deficits. Last stroke January 2019.  Not currently on aspirin  treatment. MR of head showed no acute intracranial abnormality. MR of neck showed  40% atherosclerotic stenosis of the proximal right ICA and high-grade stenosis at the left vertebral artery origin. Neuro recommended Asprin, however pt has nausea while taking it. Will start her on plavix .  --Pt started on Plavix  75 mg daily     Best Practice: Diet: Renal diet IVF: Fluids: none, Rate: None VTE: heparin  injection 5,000 Units Start: 01/30/24 1530 Code: Full  Disposition planning: Therapy Recs: SNF,  Family Contact: Daughter DISPO: Anticipated discharge tomorrow to Skilled nursing facility pending clinical improvement.  Signature:  Rebecka Edgardo [facility]  6:12 AM, 02/01/2024  On Call pager [phone]
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
## 2024-02-01 NOTE — Plan of Care (Signed)
  Problem: Education: Goal: Knowledge of General Education information will improve Description: Including pain rating scale, medication(s)/side effects and non-pharmacologic comfort measures Outcome: Progressing   Problem: Health Behavior/Discharge Planning: Goal: Ability to manage health-related needs will improve Outcome: Progressing   Problem: Clinical Measurements: Goal: Ability to maintain clinical measurements within normal limits will improve Outcome: Progressing Goal: Will remain free from infection Outcome: Progressing Goal: Diagnostic test results will improve Outcome: Progressing Goal: Respiratory complications will improve Outcome: Progressing Goal: Cardiovascular complication will be avoided Outcome: Progressing   Problem: Activity: Goal: Risk for activity intolerance will decrease Outcome: Progressing   Problem: Nutrition: Goal: Adequate nutrition will be maintained Outcome: Progressing   Problem: Coping: Goal: Level of anxiety will decrease Outcome: Progressing   Problem: Elimination: Goal: Will not experience complications related to bowel motility Outcome: Progressing Goal: Will not experience complications related to urinary retention Outcome: Progressing   Problem: Pain Managment: Goal: General experience of comfort will improve and/or be controlled Outcome: Progressing   Problem: Safety: Goal: Ability to remain free from injury will improve Outcome: Progressing   Problem: Skin Integrity: Goal: Risk for impaired skin integrity will decrease Outcome: Progressing   Problem: Education: Goal: Knowledge of disease or condition will improve Outcome: Progressing Goal: Knowledge of secondary prevention will improve (MUST DOCUMENT ALL) Outcome: Progressing Goal: Knowledge of patient specific risk factors will improve (DELETE if not current risk factor) Outcome: Progressing   Problem: Coping: Goal: Will verbalize positive feelings about  self Outcome: Progressing

## 2024-02-01 NOTE — Progress Notes (Signed)
Received back from dialysis.

## 2024-02-01 NOTE — Telephone Encounter (Signed)
 RTC to Will at [facility].  Wanted to inform doctor that the discharge evaluation for her OT will be done this week.  Will froward to PCP. Copied from CRM #[id number]. Topic: General - Other >> Jan 29, 2024 11:42 AM Mercer PEDLAR wrote: Reason for CRM: Will calling from [facility] stating that authorization is needed to move patient's discharge evaluation to next week 02/01/24 due to patient needing to reschedule.   Callback: [phone]

## 2024-02-01 NOTE — Plan of Care (Signed)
 Had an uneventful day.  Went to dialysis and tolerated well.  Back in room in bed resting.     Problem: Activity: Goal: Risk for activity intolerance will decrease Outcome: Progressing   Problem: Coping: Goal: Level of anxiety will decrease Outcome: Progressing   Problem: Clinical Measurements: Goal: Will remain free from infection Outcome: Progressing   Problem: Clinical Measurements: Goal: Diagnostic test results will improve Outcome: Progressing   Problem: Safety: Goal: Ability to remain free from injury will improve Outcome: Progressing   Problem: Education: Goal: Knowledge of secondary prevention will improve (MUST DOCUMENT ALL) Outcome: Progressing   Problem: Education: Goal: Knowledge of patient specific risk factors will improve (DELETE if not current risk factor) Outcome: Progressing

## 2024-02-01 NOTE — Progress Notes (Signed)
 Internal Medicine Clinic Attending  I was physically present during the key portions of the resident provided service and participated in the medical decision making of patient's management care. I reviewed pertinent patient test results.  The assessment, diagnosis, and plan were formulated together and I agree with the documentation in the resident's note.  Shawn Sick, MD

## 2024-02-01 NOTE — Progress Notes (Signed)
 Pt receives out[facility]pt HD at [facility] on MWF 6:10 am chair time. Info provided to CSW for d/c planning purposes. Will assist as needed.   Randine Mungo Dialysis Navigator [phone]

## 2024-02-01 NOTE — Progress Notes (Signed)
   02/01/24 1640  Vitals  Temp 97.9 F (36.6 C)  Pulse Rate 97  Resp 13  BP (!) 142/75  SpO2 99 %  Weight 43.7 kg  Type of Weight Post-Dialysis  Post Treatment  Dialyzer Clearance Lightly streaked  Hemodialysis Intake (mL) 0 mL  Liters Processed 72  Fluid Removed (mL) 2000 mL  Tolerated HD Treatment Yes  AVG/AVF Arterial Site Held (minutes) 10 minutes  AVG/AVF Venous Site Held (minutes) 10 minutes   Received patient in bed to unit.  Alert and oriented.  Informed consent signed and in chart.   TX duration:3HRS  Patient tolerated well.  Transported back to the room  Alert, without acute distress.  Hand-off given to patient's nurse.   Access used: LAVF Access issues: NONE  Total UF removed: 2L Medication(s) given: NONE    [facility]y T [facility]

## 2024-02-01 NOTE — Progress Notes (Signed)
 Transported to dialysis.

## 2024-02-02 ENCOUNTER — Encounter (HOSPITAL_COMMUNITY): Payer: Self-pay | Admitting: Internal Medicine

## 2024-02-02 DIAGNOSIS — B029 Zoster without complications: Secondary | ICD-10-CM | POA: Diagnosis present

## 2024-02-02 LAB — CSF CULTURE W GRAM STAIN
Culture: NO GROWTH
Gram Stain: NONE SEEN

## 2024-02-02 LAB — HEPATITIS B SURFACE ANTIBODY, QUANTITATIVE: Hep B S AB Quant (Post): 44.7 m[IU]/mL

## 2024-02-02 MED ORDER — NEPRO/CARBSTEADY PO LIQD
237.0000 mL | Freq: Two times a day (BID) | ORAL | Status: DC
  Administered 2024-02-02: 237 mL via ORAL

## 2024-02-02 MED ORDER — CLOPIDOGREL BISULFATE 75 MG PO TABS: 75.0000 mg | ORAL_TABLET | Freq: Every day | ORAL | Status: DC

## 2024-02-02 MED ORDER — ORAL CARE MOUTH RINSE
15.0000 mL | OROMUCOSAL | Status: DC | PRN
  Administered 2024-02-01 – 2024-02-02 (×2): 15 mL via OROMUCOSAL

## 2024-02-02 MED ORDER — SEVELAMER CARBONATE 800 MG PO TABS: 800.0000 mg | ORAL_TABLET | Freq: Three times a day (TID) | ORAL | Status: AC

## 2024-02-02 NOTE — Plan of Care (Signed)
 Pt is pleasant, alert and fully oriented x 4, afebrile, stable hemodynamically, no acute distress noted. She is able to sleep well tonight with no major complaints. We will continue to monitor.  Plan of care reviewed Pt has been progressing.   Problem: Clinical Measurements: Goal: Ability to maintain clinical measurements within normal limits will improve Outcome: Progressing Goal: Will remain free from infection Outcome: Progressing Goal: Diagnostic test results will improve Outcome: Progressing Goal: Respiratory complications will improve Outcome: Progressing Goal: Cardiovascular complication will be avoided Outcome: Progressing   Problem: Elimination: Goal: Will not experience complications related to bowel motility Outcome: Progressing Goal: Will not experience complications related to urinary retention Outcome: Progressing   Problem: Pain Managment: Goal: General experience of comfort will improve and/or be controlled Outcome: Progressing   Problem: Skin Integrity: Goal: Risk for impaired skin integrity will decrease Outcome: Progressing   Problem: Safety: Goal: Ability to remain free from injury will improve Outcome: Progressing   Wendi Dash, RN

## 2024-02-02 NOTE — Progress Notes (Addendum)
 Physical Therapy Treatment Patient Details Name: Ashley [name] MRN: [id number] DOB: [birth year] Today's Date: 02/02/2024   History of Present Illness 73 y.o. female [facility] to [facility]30[facility] with L sided weakness, facial droop, and slurred speech, code stroke. CT head negative for acute abnormalities. MRI brain negative and lumbar puncture negative. Admitted with acute encephalopathy. PMH including HTN, pancreatitis, HLD, COPD, ESRD on HD, CVA, polysubstance abuse.    PT Comments  Pt required max assist bed mobility and demo fair sitting balance EOB. Pt declining OOB to recliner due to fatigue/lethargy. She reports being woken up several times during the night. Pt supine in bed at end of session. Current POC remains appropriate.     If plan is discharge home, recommend the following: A lot of help with walking and/or transfers;A lot of help with bathing/dressing/bathroom;Assistance with cooking/housework;Assist for transportation;Help with stairs or ramp for entrance;Direct supervision/assist for medications management;Direct supervision/assist for financial management   Can travel by private vehicle     No  Equipment Recommendations  None recommended by PT    Recommendations for Other Services       Precautions / Restrictions Precautions Precautions: Fall Recall of Precautions/Restrictions: Impaired     Mobility  Bed Mobility Overal bed mobility: Needs Assistance Bed Mobility: Supine to Sit, Sit to Supine     Supine to sit: Max assist Sit to supine: Max assist   General bed mobility comments: decreased initiation, assist for all aspects of mobility. Upon sitting EOB, pt immediately returning to supine    Transfers                   General transfer comment: Pt declining due to lethargy/fatigue.    Ambulation/Gait                   Stairs             Wheelchair Mobility     Tilt Bed    Modified Rankin (Stroke Patients Only) Modified Rankin  (Stroke Patients Only) Pre-Morbid Rankin Score: Moderate disability Modified Rankin: Moderately severe disability     Balance Overall balance assessment: Needs assistance Sitting-balance support: Feet supported, Bilateral upper extremity supported Sitting balance-Leahy Scale: Fair   Postural control: Left lateral lean                                  Communication Communication Communication: No apparent difficulties  Cognition Arousal: Alert Behavior During Therapy: Agitated, Flat affect   PT - Cognitive impairments: No family/caregiver present to determine baseline, Awareness, Memory, Attention, Sequencing, Problem solving, Safety/Judgement                         Following commands: Impaired Following commands impaired: Follows one step commands inconsistently, Follows one step commands with increased time    Cueing Cueing Techniques: Verbal cues, Gestural cues  Exercises      General Comments        Pertinent Vitals/Pain Pain Assessment Pain Assessment: No/denies pain    Home Living                          Prior Function            PT Goals (current goals can now be found in the care plan section) Acute Rehab PT Goals Patient Stated Goal: not stated Progress towards PT goals: Progressing  toward goals    Frequency    Min 2X/week      PT Plan      Co-evaluation              AM-PAC PT 6 Clicks Mobility   Outcome Measure  Help needed turning from your back to your side while in a flat bed without using bedrails?: A Lot Help needed moving from lying on your back to sitting on the side of a flat bed without using bedrails?: A Lot Help needed moving to and from a bed to a chair (including a wheelchair)?: Total Help needed standing up from a chair using your arms (e.g., wheelchair or bedside chair)?: Total Help needed to walk in hospital room?: Total Help needed climbing 3-5 steps with a railing? : Total 6  Click Score: 8    End of Session Equipment Utilized During Treatment: Gait belt Activity Tolerance: Patient limited by fatigue;Patient limited by lethargy Patient left: in bed;with call bell/phone within reach;with bed alarm set Nurse Communication: Mobility status PT Visit Diagnosis: Unsteadiness on feet (R26.81);Other abnormalities of gait and mobility (R26.89);Muscle weakness (generalized) (M62.81)     Time: 9178-9163 PT Time Calculation (min) (ACUTE ONLY): 15 min  Charges:    $Therapeutic Activity: 8-22 mins PT General Charges $$ ACUTE PT VISIT: 1 Visit                     Sari MATSU., PT  Office # [phone]    Erven Sari Shaker 02/02/2024, 11:43 AM

## 2024-02-02 NOTE — Progress Notes (Signed)
 Attempted to call report.  No answer at facility. Will attempt again.

## 2024-02-02 NOTE — Discharge Instructions (Addendum)
 [name],  You were recently admitted to [facility] for some left sided weakness and confusion. After extensive work up we believed your symptoms were likely due to you taking valacyclovir . Please follow discharge instructions below:   Continue with your hemodialysis sessions every MWF We started you on Plavix  75 mg for your previous history of stroke and as per neurology recommendations. Please continue taking it.  For neck pain, you can take tylenol  as needed. Based on your physical needs, fall risk, and after discussion with your daughter, we believe it was best for you to be discharged to a skilled nursing facility.  5. Continue taking your home medications with the following changes Pause taking these medications until you follow-up with your primary care doctor about restarting them: valACYclovir  (VALTREX ) 1000 MG tablet  Continue taking:  acetaminophen  (TYLENOL ) 325 MG tablet   gabapentin  (NEURONTIN ) 100 MG capsule   hydrocortisone  cream 1 %   ipratropium-albuterol  (DUONEB) 0.5-2.5 (3) MG/3ML SOLN lidocaine  (LIDODERM ) 5 % ondansetron  (ZOFRAN -ODT) 4 MG disintegrating tablet pantoprazole  (PROTONIX ) 40 MG tablet   albuterol  (VENTOLIN  HFA) 108 (90 Base) MCG/ACT inhaler amLODipine  (NORVASC ) 10 MG tablet atorvastatin  (LIPITOR) 40 MG tablet Voltaren  gel    Fluticasone-Umeclidin-Vilant (TRELEGY ELLIPTA )  Folic Acid  3mg  tablets Hydralazine  25 mg  Creon  12000-38000  You should seek further medical care if you become acutely confused or have any new or worsening symptoms.  We recommend that you see your primary care doctor in about a week to make sure that you continue to improve.   We are so glad that you are feeling better.

## 2024-02-02 NOTE — Progress Notes (Signed)
 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 S/p lumbar puncture --CSF fld ngtd. Improved.  Complaining of neck and back pain  Hyperkalemia: Resolved with HD. K 3.7.  ESRD: on HD MWF. Had urgent HD Saturday for above.  Tolerated dialysis on Monday with 2 L net UF with no cramping   Next HD Wed; very small, emaciated with limited muscle mass.  HTN: BP elevated. Continue home meds.  Volume: gets to dry wt, low wt gains at OP unit. No vol excess today on exam.  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 No esa needs.   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  Patient is not really eating and only drinking the Nepro COPD H/o CVA   Subjective: Seen and examined in room.  Last dialysis Saturday- no issues. She's alert, oriented x 3 this morning but complaining of neck pain.  Denies any fever, chills, nausea, vomiting  Objective Vitals:   02/01/24 2141 02/02/24 0033 02/02/24 0417 02/02/24 0500  BP: 137/74 137/74 127/64   Pulse: 86 85 90   Resp: 20 18 20    Temp: 97.7 F (36.5 C) 98.8 F (37.1 C) 99.3 F (37.4 C)   TempSrc: Oral Oral Oral   SpO2: 98% 98% 100%   Weight:    41.8 kg  Height:        Additional Objective Labs: Basic Metabolic Panel: Recent Labs  Lab 01/30/24 1908 01/31/24 0402 02/01/24 0449  NA 130* 134* 133*  K 5.3* 3.7 4.4  CL 89* 94* 93*  CO2 25 28 26   GLUCOSE 81 76 89  BUN 26* 7* 20  CREATININE 7.35* 4.09* 7.13*  CALCIUM  9.2 9.4 9.3  PHOS 7.1* 4.5 7.4*   CBC: Recent Labs  Lab 01/30/24 1408 01/31/24 0402 02/01/24 0449  WBC 4.7 4.9 4.6  NEUTROABS 2.2 3.0  --   HGB 12.9 12.2 11.6*  HCT 41.0 37.1 35.1*  MCV 90.5 87.7 86.5  PLT 224 213 255   Blood Culture     Component Value Date/Time   SDES CSF 01/30/2024 1608   SPECREQUEST NONE 01/30/2024 1608   CULT  01/30/2024 1608    NO GROWTH 2 DAYS Performed at [facility], [address] [place], [place] [address]    REPTSTATUS PENDING 01/30/2024 1608     Physical Exam General: Alert, sitting up in recliner, nad Heart: RRR Lungs: Normal wob Abdomen: non tender  Extremities: no LE edema  Dialysis Access: lt BBT w/ outflow stent +bruit   Medications:   amLODipine   10 mg Oral QHS   atorvastatin   40 mg Oral Daily   budesonide -glycopyrrolate -formoterol   2 puff Inhalation BID   Chlorhexidine  Gluconate Cloth  6 each Topical Q0600   clopidogrel   75 mg Oral Daily   diclofenac  Sodium  4 g Topical QID   feeding supplement (NEPRO CARB STEADY)  237 mL Oral BID BM   heparin   5,000 Units Subcutaneous Q8H   hydrALAZINE   75 mg Oral Q8H   lipase/protease/amylase  48,000 Units Oral TID with meals   multivitamin  1 tablet Oral QHS   pantoprazole   40 mg Oral BID   sevelamer  carbonate  800 mg Oral  TID WC

## 2024-02-02 NOTE — Progress Notes (Signed)
 Attempted to call report to [facility] again.  Call was transferred to unit but no answer.  Pt has left via [facility] at this time.

## 2024-02-02 NOTE — Discharge Planning (Cosign Needed)
 [facility] Patient Discharge Orders - [facility] CLINIC: [facility]  Patient's name: Ashley [name] Admit/DC Dates: 01/30/2024 - 02/02/24  DISCHARGE DIAGNOSES: AMS: probably second to Valacyclovir  toxicity and or HSV infection. Improved  ESRD on HD   HD ORDER CHANGES: Heparin  change: no EDW Change: no   Bath Change: yes - 2K/2 Ca bath  ANEMIA MANAGEMENT: Aranesp : Given: no    ESA dose for discharge: mircera 50 mcg IV q 2 weeks, to start on at next HD IV Iron  dose at discharge: per protocol Transfusion: Given: no  BONE/MINERAL MEDICATIONS: Hectorol /Calcitriol  change: no change Sensipar /Parsabiv change: not on sensipar   ACCESS INTERVENTION/CHANGE: no change Details:   RECENT LABS: Recent Labs  Lab 02/01/24 0449  HGB 11.6*  NA 133*  K 4.4  CALCIUM  9.3  PHOS 7.4*  ALBUMIN  3.0*    IV ANTIBIOTICS: no Details:   OTHER/APPTS/LAB ORDERS: Please draw updated labs at next HD  D/C Meds to be reconciled by nurse after every discharge.  Completed By: Belvie Och, NP   Reviewed by: MD:______ RN_______

## 2024-02-02 NOTE — Care Management Important Message (Signed)
 Important Message  Patient Details  Name: Ashley [name] MRN: [id number] Date of Birth: January 02, 1951   Important Message Given:  Yes - Medicare IM     Claretta Deed 02/02/2024, 3:56 PM

## 2024-02-02 NOTE — Discharge Summary (Addendum)
 Name: Ashley [name] MRN: [id number] DOB: 12-06-1950 73 y.o. PCP: [name], DO  Date of Admission: 01/30/2024  1:35 PM Date of Discharge: 02/02/24 Attending Physician: Dr. Dayton [facility]  Discharge Diagnosis: 1. Principal Problem:   Acute encephalopathy Active Problems:   Hypertension   ESRD on dialysis (HCC)   Shingles Hyperkalemia   Discharge Medications: Allergies as of 02/02/2024       Reactions   Aspirin  Nausea And Vomiting   Stomach ache   Ibuprofen Nausea And Vomiting   Stomach ache        Medication List     STOP taking these medications    valACYclovir  1000 MG tablet Commonly known as: Valtrex        TAKE these medications    acetaminophen  325 MG tablet Commonly known as: TYLENOL  Take 2 tablets (650 mg total) by mouth every 6 (six) hours as needed for moderate pain (pain score 4-6).   albuterol  108 (90 Base) MCG/ACT inhaler Commonly known as: VENTOLIN  HFA Inhale 2 puffs into the lungs every 6 (six) hours as needed for wheezing or shortness of breath.   amLODipine  10 MG tablet Commonly known as: NORVASC  Take 1 tablet (10 mg total) by mouth daily. What changed: when to take this   atorvastatin  40 MG tablet Commonly known as: Lipitor Take 1 tablet (40 mg total) by mouth daily.   clopidogrel  75 MG tablet Commonly known as: PLAVIX  Take 1 tablet (75 mg total) by mouth daily. Start taking on: February 03, 2024   Creon  12000-38000 units Cpep capsule Generic drug: lipase/protease/amylase Take 2-4 capsules by mouth See admin instructions. Take 4 capsule by mouth three times a day with meals and take 2 capsule by mouth with snacks   diclofenac  Sodium 1 % Gel Commonly known as: VOLTAREN  Apply 4 g topically 4 (four) times daily. What changed:  when to take this reasons to take this   dicyclomine  20 MG tablet Commonly known as: BENTYL  Take 1 tablet (20 mg total) by mouth 2 (two) times daily.   folic acid -vitamin b complex-vitamin  c-selenium-zinc  3 MG Tabs tablet Take 1 tablet by mouth daily.   gabapentin  100 MG capsule Commonly known as: NEURONTIN  Take 2 capsules (200 mg total) by mouth at bedtime.   hydrALAZINE  25 MG tablet Commonly known as: APRESOLINE  Take 3 tablets (75 mg total) by mouth every 8 (eight) hours.   hydrocortisone  cream 1 % Apply to affected area 2 times daily   ipratropium-albuterol  0.5-2.5 (3) MG/3ML Soln Commonly known as: DUONEB Take 3 mLs by nebulization 3 (three) times daily. What changed:  when to take this reasons to take this   lidocaine  5 % Commonly known as: LIDODERM  Place 1 patch onto the skin daily as needed (for pain).   ondansetron  4 MG disintegrating tablet Commonly known as: ZOFRAN -ODT Take 1 tablet (4 mg total) by mouth every 8 (eight) hours as needed for nausea or vomiting.   pantoprazole  40 MG tablet Commonly known as: PROTONIX  Take 1 tablet (40 mg total) by mouth daily.   sevelamer  carbonate 800 MG tablet Commonly known as: RENVELA  Take 1 tablet (800 mg total) by mouth 3 (three) times daily with meals.   Trelegy Ellipta  100-62.5-25 MCG/ACT Aepb Generic drug: Fluticasone-Umeclidin-Vilant Inhale 1 puff into the lungs daily.        Disposition and follow-up:   Ashley [name] was discharged from [facility] in Good condition.  At the hospital follow up visit please address:  1.  Any signs of confusion or left sided deficits. Her encephalopathy was likely due to valacyclovir  toxicity as she has ESRD on HD and was on high dose valacyclovir . Make sure she is not taking her high dose valacyclovir  1000mg  3 times a day.   2. Make sure she is going to her HD sessions MWF. Her last session was yesterday 02/01/24.  3. She was discharged to SNF due to her physical needs.   2.  Labs / imaging needed at time of follow-up:  BMP: pt was hyperkalemic when she was admitted. Her hyperkalemia has resolved. Her Na levels were initially low and then were  back to baseline in the low 130s  CBC: last Hgb checked was 11.6. No signs of anemia or bleeding at present.    Follow-up Appointments:  Contact information for follow-up providers     Rihner, Emilie, DO .   Specialty: Internal Medicine Contact information: [address] [place] [address] [phone]         Harrie Bruckner, DO Follow up on 02/09/2024.   Specialty: Internal Medicine Why: Please follow-up with Dr. Harrie on 02/09/24 at 10:15 Contact information: [address] [place] [address] [phone]              Contact information for after-discharge care     Destination     [facility] .   Service: Skilled Nursing Contact information: [address] [place]  [address] [phone]                      Hospital Course by problem list:  Ashley [name] is a 73 y.o. person living with a history of HD MWF, HTN, COPD, HLD, hx CVA with no known deficits who presented with a suspected stroke and admitted for acute toxic metabolic encephalopathy most likely due to valacyclovir  toxicity.   Acute encephalopathy Agitation with suspected delirium  Initially ANO x 1 in ED, sluggish to respond to questions.  Code stroke was called.  CT head showed no acute intracranial abnormalities and CTA of the head and neck showed no LVO, at that time neurology more concern for medication toxicity versus meningitis/encephalitis.  Patient was started on valacyclovir  1g TID started 2 days before admission for herpes zoster infection.  This was a high dose as she has ESRD. LP done in the ED showed mildly elevated protein and rare lymphocytes/neutrophils but otherwise was normal and meningitis/encephalitis panel was negative.  She received an additional HD session on the night of admission.  The following morning she had improved significantly but was still not at baseline.  Neurology continued stroke workup with MRI and MRA  of the brain.  MRI and MRA of the brain were negative for stroke. Her LP, MRA, MRI was negative for stroke so it was concluded that her symptoms were likely due to valacyclovir  toxicity.   BL Neck Pain Not shooting pain with deficits. Likely due to sustained muscle contractions in the neck due to positioning of the bed. Asked to lower head of the bed. Tylenol  and voltaren  gel PRN for pain. Pt's pain was better with voltaren  gel.  --Tylenol  650 mg PRN for symptom relief  --Voltaren  gel in the area of pain   Hyperkalemia--resolved  6.4 on arrival to the ED and received temporizing measures including calcium  gluconate, sodium bicarbonate , and insulin  with dextrose .  Once able to take p.o. meds she was given Lokelma  and before going for additional dialysis  session her hyperkalemia had resolved. Current potassium levels were 4.4.    HX CVA There was concern for stroke on admission however her deficits of initially left-sided facial droop and left-sided weakness that then changed to primary lower extremity weakness or contributed to her encephalopathy.  MRI of the head confirmed slow flow within the cervical left vertebral artery consistent with abnormality seen on the CTA on admission.  MRA also showed high-grade stenosis of the left vertebral artery origin.  MRI of the brain did not show any new strokes.  Last stroke was on January 2019.  Pt was allergic to asprin, so neurology recommended plavix . Started pt on plavix  75 mg daily.  --started Plavix  75 mg    Hyponatremia Sodium on admission 127, baseline normally within normal limits.  Likely not contributory to her encephalopathy and improved during admission. Na levels were back to baseline in the low 130s. Current Na was 133.    ESRD HD MWF No recent missed dialysis sessions and last HD before admission was on 8/22.  Dialyzed on the night of 8/23 and then returned to regular dialysis schedule. Last dialysis session yesterday 02/01/24 --restarted  cinacalcet , sevelamer , creon    COPD Stable and home inhalers were continued.   HTN Slightly hypertensive on admission but improved after resuming home medications including amlodipine  10 mg daily and hydralazine  25 mg 3 times daily. BP today was 125/89. Will continue home BP meds.    HLD Recently increased atorvastatin  dose to 40mg  for longstanding hyperlipidemia and history of CVA.  Last lipid panel 12/31/2023 shows LDL 78, total cholesterol 804, triglycerides 99, HDL 100.       Subjective Pt was evaluated at bedside today. She said she was feeling better and was ready to be discharged with plans for SNF. She complained about her neck pain but said it was better when she used the voltaren  gel.   Discharge Exam:   BP 128/76   Pulse 95   Temp 98.9 F (37.2 C) (Oral)   Resp 16   Ht 5' 2 (1.575 m)   Wt 41.8 kg   SpO2 100%   BMI 16.86 kg/m  Discharge exam:   GEN: pt resting comfortably in bed HENT:     Head: Normocephalic.  Pulmonary:     Effort: Pulmonary effort is normal.  Skin:    Comments: Rash noted on the left side of the neck and left upper chest.  Neurological:     Mental Status: A&Ox4. Answered all our questions.   Pertinent Labs, Studies, and Procedures:     Latest Ref Rng & Units 02/01/2024    4:49 AM 01/31/2024    4:02 AM 01/30/2024    2:08 PM  CBC  WBC 4.0 - 10.5 K/uL 4.6  4.9  4.7   Hemoglobin 12.0 - 15.0 g/dL 88.3  87.7  87.0   Hematocrit 36.0 - 46.0 % 35.1  37.1  41.0   Platelets 150 - 400 K/uL 255  213  224        Latest Ref Rng & Units 02/01/2024    4:49 AM 01/31/2024    4:02 AM 01/30/2024    7:08 PM  CMP  Glucose 70 - 99 mg/dL 89  76  81   BUN 8 - 23 mg/dL 20  7  26    Creatinine 0.44 - 1.00 mg/dL 2.86  5.90  2.64   Sodium 135 - 145 mmol/L 133  134  130   Potassium 3.5 - 5.1 mmol/L 4.4  3.7  5.3   Chloride 98 - 111 mmol/L 93  94  89   CO2 22 - 32 mmol/L 26  28  25    Calcium  8.9 - 10.3 mg/dL 9.3  9.4  9.2     MR ANGIO NECK WO  CONTRAST Result Date: 01/31/2024 CLINICAL DATA:  Neuro deficit, acute, stroke suspected. EXAM: MRA NECK WITHOUT CONTRAST TECHNIQUE: Angiographic images of the neck were acquired using MRA technique without intravenous contrast. Carotid stenosis measurements (when applicable) are obtained utilizing NASCET criteria, using the distal internal carotid diameter as the denominator. COMPARISON:  CT angiogram head/neck 01/30/2024. FINDINGS: The aortic arch and proximal major branch vessels of the neck are excluded from the field of view. The proximal common carotid and vertebral arteries are also excluded from field of view. Within these limitations, findings are as follows. Aortic arch: Excluded from the field of view. Right carotid system: CCA and ICA patent within neck at the imaged levels. 40% atherosclerotic narrowing of the proximal ICA, better appreciated on yesterday's CTA. Left carotid system: CCA and ICA patent within the neck at the imaged levels without stenosis. Scattered atherosclerotic plaque within the CCA, about the carotid bifurcation and within the proximal ICA, better appreciated on yesterday's CTA. Vertebral arteries: The right vertebral artery is patent within the neck at the imaged levels without stenosis. There is no appreciable flow related signal within the cervical left vertebral artery at the imaged levels. IMPRESSION: 1. The aortic arch and proximal major branch vessels of the neck are excluded from the field of view. The proximal common carotid and vertebral arteries are also excluded from field of view. Within these limitations, findings are as follows. 2. Absent flow related signal within the cervical left vertebral artery at the imaged levels. Correlating with yesterday's CTA, findings suggest very slow flow within the cervical left vertebral artery. These findings also suggest that there is a high-grade stenosis at the left vertebral artery origin which was not appreciable on yesterday's  CTA. 3. The common carotid and internal carotids are patent within the neck at the imaged levels. 40% atherosclerotic stenosis of the proximal right ICA, better appreciated on yesterday's CTA. Non-stenotic atherosclerotic plaque within the left CCA and cervical ICA, also better appreciated on yesterday's CTA. Electronically Signed   By: Rockey Childs D.O.   On: 01/31/2024 11:30   MR BRAIN WO CONTRAST Result Date: 01/31/2024 CLINICAL DATA:  Provided history: Neuro deficit, acute, stroke suspected. Delirium. EXAM: MRI HEAD WITHOUT CONTRAST TECHNIQUE: Multiplanar, multiecho pulse sequences of the brain and surrounding structures were obtained without intravenous contrast. COMPARISON:  Non-contrast head CT and CT angiogram head/neck 01/30/2024. Brain MRI 12/22/2023. FINDINGS: Brain: Generalized cerebral atrophy. Multifocal T2 FLAIR hyperintense signal abnormality within the cerebral white matter, nonspecific but compatible with advanced chronic small vessel ischemic disease. Mild chronic small vessel ischemic changes within the pons. There is no acute infarct. No evidence of an intracranial mass. No chronic intracranial blood products. No extra-axial fluid collection. No midline shift. Vascular: Maintained flow voids within the proximal large arterial vessels. Skull and upper cervical spine: No focal worrisome marrow lesion. Incompletely assessed cervical spondylosis. Sinuses/Orbits: No mass or acute finding within the imaged orbits. No significant paranasal sinus disease. IMPRESSION: 1.  No evidence of an acute intracranial abnormality. 2. Chronic small vessel ischemic changes which are advanced in the cerebral white matter and mild in the pons. Findings are similar to the prior MRI of 12/22/2023. 3. Generalized cerebral atrophy. Electronically Signed   By: Rockey  Richelle D.O.   On: 01/31/2024 11:08   CT ANGIO HEAD NECK W WO CM W PERF (CODE STROKE) Addendum Date: 01/30/2024 ADDENDUM REPORT: 01/30/2024 15:10  ADDENDUM: No emergent large vessel occlusion identified. This result and CTA neck impression #2 called by telephone at the time of interpretation on 01/30/2024 at 3:10 pm to provider [facility] , who verbally acknowledged these results. Electronically Signed   By: Rockey Richelle D.O.   On: 01/30/2024 15:10   Result Date: 01/30/2024 CLINICAL DATA:  Provided history: Neuro deficit, acute, stroke suspected. Additional history obtained from electronic MEDICAL RECORD NUMBERSlurred speech, left-sided weakness, left-sided facial droop. EXAM: CT ANGIOGRAPHY HEAD AND NECK CT PERFUSION BRAIN TECHNIQUE: Multidetector CT imaging of the head and neck was performed using the standard protocol during bolus administration of intravenous contrast. Multiplanar CT image reconstructions and MIPs were obtained to evaluate the vascular anatomy. Carotid stenosis measurements (when applicable) are obtained utilizing NASCET criteria, using the distal internal carotid diameter as the denominator. Multiphase CT imaging of the brain was performed following IV bolus contrast injection. Subsequent parametric perfusion maps were calculated using RAPID software. RADIATION DOSE REDUCTION: This exam was performed according to the departmental dose-optimization program which includes automated exposure control, adjustment of the mA and/or kV according to patient size and/or use of iterative reconstruction technique. CONTRAST:  OMNIPAQUE  IOHEXOL  350 MG/ML SOLN COMPARISON:  Noncontrast head CT performed earlier today 01/30/2024. FINDINGS: CTA NECK FINDINGS Aortic arch: Common origin of the innominate and left common carotid arteries. Atherosclerotic plaque within the aortic arch and proximal major branch vessels of the neck. Streak/beam hardening artifact arising from a dense contrast bolus partially obscures the right subclavian artery. Within this limitation, there is no appreciable hemodynamically significant innominate or proximal  subclavian artery stenosis. Right carotid system: CCA and ICA patent within the neck. Atherosclerotic plaque within the proximal ICA resulting in 40% stenosis. Tortuosity of the distal cervical ICA Left carotid system: CCA and ICA patent within the neck without measurable stenosis. Mild atherosclerotic plaque scattered within the CCA, but the carotid bifurcation and within the proximal ICA. Tortuosity of the distal cervical ICA. Vertebral arteries: Patent within neck. Nonstenotic atherosclerotic plaque at the bilateral vertebral artery origins. There is asymmetrically diminished enhancement of the proximal and mid cervical left vertebral artery of uncertain etiology and clinical significance. Skeleton: Edentulous maxilla. Numerous absent mandibular teeth. Spondylosis at the cervical and visualized upper thoracic levels. No acute fracture or aggressive osseous lesion. Other neck: Subcentimeter nodule within the right thyroid  lobe not meeting consensus criteria for ultrasound follow-up based on size. No follow-up imaging recommended. Reference: J Am Coll Radiol. 2015 Feb;12(2): 143-50. Upper chest: No consolidation within the imaged lung apices. Emphysema. Review of the MIP images confirms the above findings CTA HEAD FINDINGS Anterior circulation: The intracranial internal carotid arteries are patent. Atherosclerotic plaque bilaterally with no more than mild stenosis. The M1 middle cerebral arteries are patent. No M2 proximal branch occlusion or high-grade proximal stenosis. The anterior cerebral arteries are patent. No intracranial aneurysm is identified. Posterior circulation: The intracranial vertebral arteries are patent. Calcified atherosclerotic plaque within the V4 left vertebral artery resulting in moderate stenosis. The basilar artery is patent. The posterior cerebral arteries are patent. A left posterior communicating artery is present. The right posterior communicating artery is diminutive or absent. Venous  sinuses: Contrast timing limits evaluation for open sinus thrombosis. Anatomic variants: As described. Review of the MIP images confirms the above findings CT Brain Perfusion Findings: CBF (<30%) Volume: 0mL  Perfusion (Tmax>6.0s) volume: 0mL Mismatch Volume: 0mL Infarction Location:None identified Attempts are being made to reach the ordering provider at this time. IMPRESSION: CTA neck: 1. The common carotid and internal carotid arteries are patent within the neck. Atherosclerotic plaque bilaterally as described. Most notably, atherosclerotic plaque within the proximal right ICA results in 40% stenosis. 2. Vertebral arteries patent within the neck. Nonstenotic plaque at both vertebral artery origins. There is asymmetrically diminished enhancement of the proximal and mid cervical left vertebral artery of uncertain etiology and clinical significance. 3. Aortic Atherosclerosis (ICD10-I70.0) and Emphysema (ICD10-J43.9). CTA head: 1. No proximal intracranial large vessel occlusion or high-grade proximal arterial stenosis identified. 2. Atherosclerotic plaque within the intracranial ICAs with no more than mild stenosis. CT perfusion head: The perfusion software identifies no core infarct. The perfusion software identifies no hypoperfused parenchyma (utilizing the Tmax>6 seconds threshold). No mismatch volume reported. Electronically Signed: By: Rockey Childs D.O. On: 01/30/2024 14:57   CT HEAD CODE STROKE WO CONTRAST Result Date: 01/30/2024 CLINICAL DATA:  Code stroke. Provided history: Neuro deficit, acute, stroke suspected. EXAM: CT HEAD WITHOUT CONTRAST TECHNIQUE: Contiguous axial images were obtained from the base of the skull through the vertex without intravenous contrast. RADIATION DOSE REDUCTION: This exam was performed according to the departmental dose-optimization program which includes automated exposure control, adjustment of the mA and/or kV according to patient size and/or use of iterative reconstruction  technique. COMPARISON:  Head CT 12/22/2023.  Brain MRI 12/22/2023. FINDINGS: Brain: Generalized cerebral atrophy. Patchy and ill-defined hypoattenuation within the cerebral white matter, nonspecific but compatible with advanced chronic small vessel ischemic disease. There is no acute intracranial hemorrhage. No demarcated cortical infarct. No extra-axial fluid collection. No evidence of an intracranial mass. No midline shift. Vascular: No hyperdense vessel.  Atherosclerotic calcifications. Skull: No calvarial fracture or aggressive osseous lesion. Sinuses/Orbits: No mass or acute finding within the imaged orbits. No significant paranasal sinus disease at the imaged levels. ASPECTS [facility] Stroke Program Early CT Score) - Ganglionic level infarction (caudate, lentiform nuclei, internal capsule, insula, M1-M3 cortex): 7 - Supraganglionic infarction (M4-M6 cortex): 3 Total score (0-10 with 10 being normal): 10 No evidence of an acute intracranial abnormality. These results were communicated to Dr. Michaela at 1:49 pmon 8/23/2025by text page via the [facility] messaging system. IMPRESSION: 1. No evidence of an acute intracranial abnormality. 2. Parenchymal atrophy and chronic small vessel ischemic disease. Electronically Signed   By: Rockey Childs D.O.   On: 01/30/2024 13:49     Discharge Instructions: Discharge Instructions     Diet - low sodium heart healthy   Complete by: As directed    Increase activity slowly   Complete by: As directed        Signed: Edgardo Pontiff, DO 02/02/2024, 2:00 PM

## 2024-02-02 NOTE — Progress Notes (Signed)
 D/C order noted. Contacted [facility] to be advised of pt's d/c today to snf and that pt should resume care tomorrow. Clinic provided snf name. HD info placed on AVS.   Randine Mungo Dialysis Navigator [phone]

## 2024-02-09 ENCOUNTER — Encounter: Payer: Self-pay | Admitting: Student

## 2024-02-09 ENCOUNTER — Ambulatory Visit: Admitting: Student

## 2024-02-09 ENCOUNTER — Other Ambulatory Visit: Payer: Self-pay

## 2024-02-09 VITALS — BP 101/58 | HR 90 | Temp 98.2°F | Ht 62.0 in | Wt 99.4 lb

## 2024-02-09 DIAGNOSIS — K31819 Angiodysplasia of stomach and duodenum without bleeding: Secondary | ICD-10-CM

## 2024-02-09 DIAGNOSIS — Z992 Dependence on renal dialysis: Secondary | ICD-10-CM | POA: Diagnosis not present

## 2024-02-09 DIAGNOSIS — G9340 Encephalopathy, unspecified: Secondary | ICD-10-CM

## 2024-02-09 DIAGNOSIS — N186 End stage renal disease: Secondary | ICD-10-CM

## 2024-02-09 DIAGNOSIS — Z8673 Personal history of transient ischemic attack (TIA), and cerebral infarction without residual deficits: Secondary | ICD-10-CM

## 2024-02-09 NOTE — Assessment & Plan Note (Signed)
 Was discharged on Plavix  75 daily.  Reasonable to continue.  Earlier this summer had noted some black stools but those have resolved.  Do not suspect any ongoing bleeding.

## 2024-02-09 NOTE — Assessment & Plan Note (Signed)
 Noted black stools earlier this summer.  This have since resolved.  She will let us  know if they return.  She is on Plavix .

## 2024-02-09 NOTE — Assessment & Plan Note (Signed)
 Remains on Monday Wednesday Friday schedule.  Has not missed any recent visits.

## 2024-02-09 NOTE — Assessment & Plan Note (Signed)
 Hospital follow-up for this issue.  In hospital 8/23 through 8/26 for acute altered mental state, alert and oriented x 1.  Culprit suspected to be valacyclovir  toxicity.  She is ESRD and 2 days prior was started on a course of the medicine for her bothersome vesicular rash on her left neck and chest.  Hospital stroke workup and meningitis workup including LP were unrevealing.  Altered mental status resolved upon cessation of valacyclovir .  Since discharged to SNF and she has about 2 weeks remaining in her stay there.  Today she is with her daughter.  They have no concerns.  Ashley [name] is fully oriented, she has no deficits on her neuroexam.  She is not taking the valacyclovir .  She is attending dialysis.  She is proceeding with her exercises at SNF.  I feel that she is doing well.  Furthermore, her rash is better also.  Recommend no medicine changes or referrals today.

## 2024-02-09 NOTE — Progress Notes (Signed)
 CC: HFU encephalopathy  HPI:  Ashley [name] is a 73 y.o. female with a PMH stated below who presents today for hospital follow-up.  She was hospitalized last month for acute encephalopathy presumed due to valacyclovir  toxicity.  In part this is because she is ESRD.  She has no concerns today.  She is here with her daughter.  Who also has no concerns.  She comes from SNF where she is getting strength exercises daily..  Please see problem based assessment and plan for additional details.  Past Medical History:  Diagnosis Date   Acute pancreatitis 2000   2000, 12/2018, 08/2019   Arthritis    Cervical radiculopathy 02/28/2011   Cocaine substance abuse (HCC) 05/26/2013   positive UDS    COPD (chronic obstructive pulmonary disease) (HCC)    COPD (chronic obstructive pulmonary disease) (HCC) 12/22/2023   Duodenitis    Dyspnea    Erosive gastropathy    ESRD on hemodialysis (HCC)    TTS   GERD (gastroesophageal reflux disease)    Hepatitis C 1987   dt hx IVDA.  genotype 2B.  Epclusa  started early 04/2020.     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  Chronic microvascular ischemic changes w parenchymal volume loss. Chronic white matter periventricular microhemorrhage, likely due to htn   Ulcer 1990   gastric ulcer. Ruptured s/p emergency repair   Review of Systems: ROS negative except for what is noted on the assessment and plan.  Vitals:   02/09/24 1038  BP: (!) 101/58  Pulse: 90  Temp: 98.2 F (36.8 C)  TempSrc: Oral  SpO2: 95%  Weight: 99 lb 6.4 oz (45.1 kg)  Height: 5' 2 (1.575 m)   Physical Exam: Constitutional: frail but well-appearing woman in no acute distress. She uses a walker. HENT: normocephalic atraumatic,  mucous membranes moist Cardiovascular: regular rate and rhythm, no m/r/g Pulmonary/Chest: normal work of breathing on room air, lungs clear to auscultation bilaterally Abdominal: soft, non-tender, non-distended MSK: Frail. Somewhat reduced strength globally, 4/5. Neurological: alert & oriented x 3, no focal deficit Skin: warm and dry Psych: normal mood and behavior  Assessment & Plan:   Patient discussed with Dr. Lovie  Acute encephalopathy, Resolved Hospital follow-up for this issue.  In hospital 8/23 through 8/26 for acute altered mental state, alert and oriented x 1.  Culprit suspected to be valacyclovir  toxicity.  She is ESRD and 2 days prior was started on a course of the medicine for her bothersome vesicular rash on her left neck and chest.  Hospital stroke workup and meningitis workup including LP were unrevealing.  Altered mental status resolved upon cessation of valacyclovir .  Since discharged to SNF and she has about 2 weeks remaining in her stay there.  Today she is with her daughter.  They have no concerns.  [name] is fully oriented, she has no deficits on her neuroexam.  She is not taking the valacyclovir .  She is attending dialysis.  She is proceeding with her exercises at SNF.  I feel that she is doing well.  Furthermore, her rash is better also.  Recommend no medicine changes or referrals today. Will defer labs as she is assessed at dialysis.  History of  CVA (cerebrovascular accident) Was discharged on Plavix  75 daily.  Reasonable to continue.  Earlier this summer had noted some black stools but those have resolved.  Do not suspect any ongoing bleeding.  ESRD on dialysis [facility]) Remains on Monday Wednesday Friday schedule.  Has not missed any recent visits.  Rectal bleeding Noted black stools earlier this summer.  This have since resolved.  She will let us  know if they return.  She is on Plavix .  Angiodysplasia of duodenum Noted black stools earlier this summer. This  have since resolved. She will let us  know if they return. She is on Plavix .   RTC in 1 month for geriatrics assessment with Dr Trudy.  I have also offered an appointment with our pharmacist Dr Brinda for medicine education. Referral placed.  [name], D.O. [facility] Internal Medicine, PGY-2 Phone: [phone] Date 02/09/2024 Time 11:51 AM

## 2024-02-10 NOTE — Progress Notes (Signed)
 Internal Medicine Clinic Attending  Case discussed with the resident at the time of the visit.  We reviewed the resident's history and exam and pertinent patient test results.  I agree with the assessment, diagnosis, and plan of care documented in the resident's note.    Patient is doing well at SNF, her encephalopathy has fully resolved.   I think she'd benefit from seeing Dr. Brinda after discharge from skilled nursing facility to review medications

## 2024-02-17 ENCOUNTER — Inpatient Hospital Stay: Payer: Self-pay

## 2024-02-17 ENCOUNTER — Ambulatory Visit

## 2024-02-18 ENCOUNTER — Telehealth: Payer: Self-pay | Admitting: *Deleted

## 2024-02-18 NOTE — Progress Notes (Signed)
 Care Guide Pharmacy Note  02/18/2024 Name: Ashley [name] MRN: [id number] DOB: 11/16/1950  Referred By: [name], DO Reason for referral: Complex Care Management (Initial outreach to schedule referral with PharmD)   Ashley [name] is a 73 y.o. year old female who is a primary care patient of Rihner, Emilie, DO.  Ashley [name] was referred to the pharmacist for assistance related to: CVA. Educate and review medications  Successful contact was made with the patient to discuss pharmacy services including being ready for the pharmacist to call at least 5 minutes before the scheduled appointment time and to have medication bottles and any blood pressure readings ready for review. The patient agreed to meet with the pharmacist via in office  on (date/time). 02/25/24 230 PM   Ashley [name]  [facility], [facility] Guide  Direct Dial : [phone]  Fax [phone]

## 2024-02-23 ENCOUNTER — Ambulatory Visit: Admitting: Student
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
## 2024-02-25 ENCOUNTER — Ambulatory Visit

## 2024-02-25 NOTE — Progress Notes (Deleted)
 02/25/2024 Name: Ashley [name] MRN: [id number] DOB: Aug 30, 1950  No chief complaint on file.   Ashley [name] is a 73 y.o. year old female who was referred for medication management by their primary care provider, Rihner, Emilie, DO. They presented for a face to face visit today.   They were referred to the pharmacist by their PCP for assistance in managing complex medication management . PMH includes HTN, hx of GI bleed (angiodysplasia, AVM, hemorrhoids), COPD, HepC s/p tx, recurrent pancreatitis, ESRD on dialysis (M/W/F), CVA (2019), tobacco use, HLD, hx of substance use.   Subjective: Patient was last seen by PCP, [name], DO, on 92/2/25 following recent admission at [facility] from 01/30/24 to 02/02/24 for acute encephalopathy likely due to valcylovir toxicity. She was discharged to SNF. At last visit, encephalopathy had resolved and she was recovering well at the SNF. She was referred to pharmacy for a medication review upon discharge from SNF.  Today, patient presents in *** good spirits and presents without *** any assistance. ***Patient is accompanied by ***.   Schedule in 1 mo with Dr. Trudy for Geriatrics assessment   Care Team: Primary Care Provider: [name], DO ; Next Scheduled Visit: *** {careteamprovider:27366}  Medication Access/Adherence  Current Pharmacy:  [id number] [place] - [address] [place] [address] Phone: [phone] Fax: [phone]  [facility]  - [place] - [address] [place] [address] Phone: [phone] Fax: [phone]   Patient reports affordability concerns with their medications: {YES/NO:21197} Patient reports access/transportation concerns to their pharmacy: {YES/NO:21197} Patient reports adherence concerns with their medications:  {YES/NO:21197} ***  *** Patient denies adherence with medications,  reports missing *** medications *** times per week, on average.   Medication Management:  Current adherence strategy: ***  Patient reports {Good Fair [phone]} adherence to medications  Patient reports the following barriers to adherence: ***  Recent fill dates:   HTN: amlodipine  10 mg daily (filled 10/26/23 for 90ds), hydralazine  75 mg (3 tablets) Q8H  HLD/ASCVD Secondary Prevention: atorvastatin  40 mg daily, clopidogrel  75 mg daily (02/03/24 for 2ds)  Chronic pancreatitis: Creon  12000-38000 units - 2-4 capsules with meals and snacks (filled 12/08/23 for 28ds, #400)  COPD: Trelegy Ellipta  (fluticasone-umeclidinium-vilanterol) 100-62.5-25 mcg/act 1 puff daily, albuterol  2 puffs Q6H PRN, Duoneb 3 mL TID PRN ShOB  Pain: gabapentin  200 mg at bedtime, lidocaine  5% patch once daily PRN  ESRD: Sevelamer  800 mg TID with meals (filled 09/16/23 for 30ds)  GERD: pantoprazole  40 mg daily  Objective:  BP Readings from Last 3 Encounters:  02/09/24 (!) 101/58  02/02/24 (!) 143/66  01/28/24 (!) 125/56    Lab Results  Component Value Date   HGBA1C 5.1 06/14/2017   HGBA1C 5.8 09/19/2014   HGBA1C 6.0 (H) 02/17/2011       Latest Ref Rng & Units 02/01/2024    4:49 AM 01/31/2024    4:02 AM 01/30/2024    7:08 PM  BMP  Glucose 70 - 99 mg/dL 89  76  81   BUN 8 - 23 mg/dL 20  7  26    Creatinine 0.44 - 1.00 mg/dL 2.86  5.90  2.64   Sodium 135 - 145 mmol/L 133  134  130   Potassium 3.5 - 5.1 mmol/L 4.4  3.7  5.3   Chloride 98 - 111 mmol/L 93  94  89   CO2 22 - 32 mmol/L 26  28  25   Calcium  8.9 - 10.3 mg/dL 9.3  9.4  9.2     Lab Results  Component Value Date   CHOL 195 12/31/2023   HDL 100 12/31/2023   LDLCALC 78 12/31/2023   TRIG 99 12/31/2023   CHOLHDL 2.0 12/31/2023    Medications Reviewed Today   Medications were not reviewed in this encounter       Assessment/Plan:   Medication Management: - Currently strategy {sufficient/insufficient:26424} to maintain  appropriate adherence to prescribed medication regimen - Suggested use of weekly pill box to organize medications - Created list of medication, indication, and administration time. Provided to patient - Discussed collaboration with local pharmacies for adherence packaging. Reviewed local pharmacies with adherence packaging options. Patient elects to ***   Written patient instructions provided. Patient verbalized understanding of treatment plan.   Follow Up Plan:  Pharmacist *** PCP clinic visit in *** Patient seen with ***  ***

## 2024-02-29 ENCOUNTER — Ambulatory Visit

## 2024-02-29 ENCOUNTER — Telehealth: Payer: Self-pay

## 2024-02-29 NOTE — Progress Notes (Deleted)
 02/29/2024 Name: Ashley [name] MRN: [id number] DOB: [birth year]  No chief complaint on file.   Ashley [name] is a 73 y.o. year old female who was referred for medication management by their primary care provider, Rihner, Emilie, DO. They presented for a face to face visit today.   They were referred to the pharmacist by their PCP for assistance in managing complex medication management . PMH includes HTN, hx of GI bleed (angiodysplasia, AVM, hemorrhoids), COPD, HepC s/p tx, recurrent pancreatitis, ESRD on dialysis (M/W/F), CVA (2019), tobacco use, HLD, hx of substance use.   Subjective: Patient was last seen by PCP, [name], DO, on 92/2/25 following recent admission at [facility] from 01/30/24 to 02/02/24 for acute encephalopathy likely due to valcylovir toxicity. She was discharged to SNF. At last visit, encephalopathy had resolved and she was recovering well at the SNF. She was referred to pharmacy for a medication review upon discharge from SNF.  Today, patient presents in *** good spirits and presents without *** any assistance. ***Patient is accompanied by ***.   Schedule in 1 mo with Dr. Trudy for Geriatrics assessment   Care Team: Primary Care Provider: [name], DO ; Next Scheduled Visit: *** {careteamprovider:27366}  Medication Access/Adherence  Current Pharmacy:  [facility] #[id number] [facility] [place] - [address] [place] [address] Phone: [phone] Fax: [phone]  [facility] [place]  - [place], [place] - [address] [place] [address] Phone: [phone] Fax: [phone]   Patient reports affordability concerns with their medications: {YES/NO:21197} Patient reports access/transportation concerns to their pharmacy: {YES/NO:21197} Patient reports adherence concerns with their medications:  {YES/NO:21197} ***  *** Patient denies adherence with medications,  reports missing *** medications *** times per week, on average.   Medication Management:  Current adherence strategy: ***  Patient reports {Good Fair Poor:[phone]} adherence to medications  Patient reports the following barriers to adherence: ***  Recent fill dates:   HTN: amlodipine  10 mg daily (filled 10/26/23 for 90ds), hydralazine  75 mg (3 tablets) Q8H  HLD/ASCVD Secondary Prevention: atorvastatin  40 mg daily, clopidogrel  75 mg daily (02/03/24 for 2ds)  Chronic pancreatitis: Creon  12000-38000 units - 2-4 capsules with meals and snacks (filled 12/08/23 for 28ds, #400)  COPD: Trelegy Ellipta  (fluticasone-umeclidinium-vilanterol) 100-62.5-25 mcg/act 1 puff daily, albuterol  2 puffs Q6H PRN, Duoneb 3 mL TID PRN ShOB  Pain: gabapentin  200 mg at bedtime, lidocaine  5% patch once daily PRN  ESRD: Sevelamer  800 mg TID with meals (filled 09/16/23 for 30ds)  GERD: pantoprazole  40 mg daily  Objective:  BP Readings from Last 3 Encounters:  02/09/24 (!) 101/58  02/02/24 (!) 143/66  01/28/24 (!) 125/56    Lab Results  Component Value Date   HGBA1C 5.1 06/14/2017   HGBA1C 5.8 09/19/2014   HGBA1C 6.0 (H) 02/17/2011       Latest Ref Rng & Units 02/01/2024    4:49 AM 01/31/2024    4:02 AM 01/30/2024    7:08 PM  BMP  Glucose 70 - 99 mg/dL 89  76  81   BUN 8 - 23 mg/dL 20  7  26    Creatinine 0.44 - 1.00 mg/dL 2.86  5.90  2.64   Sodium 135 - 145 mmol/L 133  134  130   Potassium 3.5 - 5.1 mmol/L 4.4  3.7  5.3   Chloride 98 - 111 mmol/L 93  94  89   CO2 22 - 32 mmol/L 26  28  25   Calcium  8.9 - 10.3 mg/dL 9.3  9.4  9.2     Lab Results  Component Value Date   CHOL 195 12/31/2023   HDL 100 12/31/2023   LDLCALC 78 12/31/2023   TRIG 99 12/31/2023   CHOLHDL 2.0 12/31/2023    Medications Reviewed Today   Medications were not reviewed in this encounter       Assessment/Plan:   Medication Management: - Currently strategy {sufficient/insufficient:26424} to maintain  appropriate adherence to prescribed medication regimen - Suggested use of weekly pill box to organize medications - Created list of medication, indication, and administration time. Provided to patient - Discussed collaboration with local pharmacies for adherence packaging. Reviewed local pharmacies with adherence packaging options. Patient elects to ***   Written patient instructions provided. Patient verbalized understanding of treatment plan.   Follow Up Plan:  Pharmacist *** PCP clinic visit in *** Patient seen with ***  ***

## 2024-02-29 NOTE — Telephone Encounter (Signed)
 Attempted to contact patient for scheduled appointment for medication management. Initially patient's daughter answered and requested call back around 3:40PM. Upon returning call, had to LVM. Will outreach via MyChart to reschedule.   Lorain Baseman, PharmD [facility] [phone]
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
## 2024-03-01 ENCOUNTER — Ambulatory Visit: Attending: Vascular Surgery | Admitting: Vascular Surgery

## 2024-03-21 ENCOUNTER — Ambulatory Visit

## 2024-03-21 NOTE — Progress Notes (Deleted)
 03/21/2024 Name: Ashley [name] MRN: [id number] DOB: Mar 14, 1951  No chief complaint on file.   Ashley [name] is a 73 y.o. year old female who was referred for medication management by their primary care provider, Rihner, Emilie, DO. They presented for a face to face visit today.   They were referred to the pharmacist by their PCP for assistance in managing complex medication management . PMH includes HTN, hx of GI bleed (angiodysplasia, AVM, hemorrhoids), COPD, HepC s/p tx, recurrent pancreatitis, ESRD on dialysis (M/W/F), CVA (2019), tobacco use, HLD, hx of substance use.   Subjective: Patient was last seen by PCP, Ashley Africa, DO, on 92/2/25 following recent admission at [facility] from 01/30/24 to 02/02/24 for acute encephalopathy likely due to valcylovir toxicity. She was discharged to SNF. At last visit, encephalopathy had resolved and she was recovering well at the SNF. She was referred to pharmacy for a medication review upon discharge from SNF.  Today, patient presents in *** good spirits and presents without *** any assistance. ***Patient is accompanied by ***.   Schedule in 1 mo with Dr. Trudy for Geriatrics assessment   Care Team: Primary Care Provider: [name], DO ; Next Scheduled Visit: *** {careteamprovider:27366}  Medication Access/Adherence  Current Pharmacy:  [id number], [place] - [address] AT [address] [place] [address] Phone: [phone] Fax: [phone]  [facility]  - [place] - [address] [place] [address] Phone: [phone] Fax: [phone]   Patient reports affordability concerns with their medications: {YES/NO:21197} Patient reports access/transportation concerns to their pharmacy: {YES/NO:21197} Patient reports adherence concerns with their medications:  {YES/NO:21197} ***  *** Patient denies adherence with medications,  reports missing *** medications *** times per week, on average.   Medication Management:  Current adherence strategy: ***  Patient reports {Good Fair [phone]} adherence to medications  Patient reports the following barriers to adherence: ***  Recent fill dates:   HTN: amlodipine  10 mg daily (filled 10/26/23 for 90ds), hydralazine  75 mg (3 tablets) Q8H  HLD/ASCVD Secondary Prevention: atorvastatin  40 mg daily, clopidogrel  75 mg daily (02/03/24 for 2ds)  Chronic pancreatitis: Creon  12000-38000 units - 2-4 capsules with meals and snacks (filled 12/08/23 for 28ds, #400)  COPD: Trelegy Ellipta  (fluticasone-umeclidinium-vilanterol) 100-62.5-25 mcg/act 1 puff daily, albuterol  2 puffs Q6H PRN, Duoneb 3 mL TID PRN ShOB  Pain: gabapentin  200 mg at bedtime, lidocaine  5% patch once daily PRN  ESRD: Sevelamer  800 mg TID with meals (filled 09/16/23 for 30ds)  GERD: pantoprazole  40 mg daily  Objective:  BP Readings from Last 3 Encounters:  02/09/24 (!) 101/58  02/02/24 (!) 143/66  01/28/24 (!) 125/56    Lab Results  Component Value Date   HGBA1C 5.1 06/14/2017   HGBA1C 5.8 09/19/2014   HGBA1C 6.0 (H) 02/17/2011       Latest Ref Rng & Units 02/01/2024    4:49 AM 01/31/2024    4:02 AM 01/30/2024    7:08 PM  BMP  Glucose 70 - 99 mg/dL 89  76  81   BUN 8 - 23 mg/dL 20  7  26    Creatinine 0.44 - 1.00 mg/dL 2.86  5.90  2.64   Sodium 135 - 145 mmol/L 133  134  130   Potassium 3.5 - 5.1 mmol/L 4.4  3.7  5.3   Chloride 98 - 111 mmol/L 93  94  89   CO2 22 - 32 mmol/L 26  28  25   Calcium  8.9 - 10.3 mg/dL 9.3  9.4  9.2     Lab Results  Component Value Date   CHOL 195 12/31/2023   HDL 100 12/31/2023   LDLCALC 78 12/31/2023   TRIG 99 12/31/2023   CHOLHDL 2.0 12/31/2023    Medications Reviewed Today   Medications were not reviewed in this encounter       Assessment/Plan:   Medication Management: - Currently strategy {sufficient/insufficient:26424} to maintain  appropriate adherence to prescribed medication regimen - Suggested use of weekly pill box to organize medications - Created list of medication, indication, and administration time. Provided to patient - Discussed collaboration with local pharmacies for adherence packaging. Reviewed local pharmacies with adherence packaging options. Patient elects to ***   Written patient instructions provided. Patient verbalized understanding of treatment plan.   Follow Up Plan:  Pharmacist *** PCP clinic visit in *** Patient seen with ***  Lorain Baseman, PharmD [facility] [phone]

## 2024-04-04 ENCOUNTER — Ambulatory Visit

## 2024-04-19 ENCOUNTER — Ambulatory Visit: Attending: Vascular Surgery | Admitting: Vascular Surgery

## 2024-06-20 ENCOUNTER — Encounter: Payer: Self-pay | Admitting: Surgery

## 2024-07-05 ENCOUNTER — Ambulatory Visit: Admitting: Student

## 2024-07-05 VITALS — BP 121/47 | HR 70 | Temp 98.4°F | Ht 62.0 in | Wt 106.4 lb

## 2024-07-05 DIAGNOSIS — K921 Melena: Secondary | ICD-10-CM

## 2024-07-05 DIAGNOSIS — G8929 Other chronic pain: Secondary | ICD-10-CM

## 2024-07-05 DIAGNOSIS — I120 Hypertensive chronic kidney disease with stage 5 chronic kidney disease or end stage renal disease: Secondary | ICD-10-CM

## 2024-07-05 DIAGNOSIS — Z992 Dependence on renal dialysis: Secondary | ICD-10-CM | POA: Diagnosis not present

## 2024-07-05 DIAGNOSIS — K279 Peptic ulcer, site unspecified, unspecified as acute or chronic, without hemorrhage or perforation: Secondary | ICD-10-CM

## 2024-07-05 DIAGNOSIS — N186 End stage renal disease: Secondary | ICD-10-CM | POA: Diagnosis not present

## 2024-07-05 DIAGNOSIS — K5520 Angiodysplasia of colon without hemorrhage: Secondary | ICD-10-CM

## 2024-07-05 DIAGNOSIS — Z8673 Personal history of transient ischemic attack (TIA), and cerebral infarction without residual deficits: Secondary | ICD-10-CM

## 2024-07-05 DIAGNOSIS — M25511 Pain in right shoulder: Secondary | ICD-10-CM | POA: Diagnosis not present

## 2024-07-05 DIAGNOSIS — I10 Essential (primary) hypertension: Secondary | ICD-10-CM

## 2024-07-05 LAB — POC HEMOCCULT BLD/STL (OFFICE/1-CARD/DIAGNOSTIC): Fecal Occult Blood, POC: POSITIVE — AB

## 2024-07-05 MED ORDER — PANTOPRAZOLE SODIUM 40 MG PO TBEC: 40.0000 mg | DELAYED_RELEASE_TABLET | Freq: Every day | ORAL | 2 refills | Status: AC

## 2024-07-05 MED ORDER — ASPIRIN 81 MG PO TBEC: 81.0000 mg | DELAYED_RELEASE_TABLET | Freq: Every day | ORAL | 3 refills | Status: AC

## 2024-07-05 NOTE — Assessment & Plan Note (Signed)
 Patient has not taken Plavix  75 mg daily since August 2025. She is not on ASA currently. With melenic stools, will d/c plavix . Plan: -D/c Plavix  -Start ASA--> Patient is refusing to take aspirin  81 mg due to side effects. I sent a prescription into the pharmacy if she changes her mind.

## 2024-07-05 NOTE — Assessment & Plan Note (Addendum)
 Patient presented with a history of chronic melenic stools along with a AVM in 2024. She reported that the melena started again after she was discharged from the hospital and continued. She denies BRBPR as well as sx of anemia today. She reports not taking Plavix  at home. She denies alcohol use as well as ibuprofen use. Plan: -GI referral sent -CBC
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
## 2024-07-05 NOTE — Assessment & Plan Note (Signed)
 Patient presents with a history of hypertension with a blood pressure today of 121/47. Their hypertension is controlled on a regimen of amlodipine  10 mg & hydralazine  75 mg TID.  Prior BMP in August 2025, Scr was7.13 (ESRD on HD).  Patient denies symptoms of hypotension at home, she does have sx of hypotension at HD.  They do not check their blood pressure at home. Plan: -Continue current regimen of:amlodipine  10 mg & hydralazine  75 mg TID -Patient asked to speak with nephrology in regards to HTN regimen on HD days -Patient instructed to check her blood pressure daily and not take HTN meds if BP is low  -BMP today

## 2024-07-05 NOTE — Assessment & Plan Note (Addendum)
 Patient reported chronic right shoulder pain after she fell one year prior. She has tried heat and lidocaine  patches without relief. On exam, she has decreased IR and abduction.  Plan -PT referral -Tylenol , ice/heat at home -Patient may schedule appointment for shoulder injection

## 2024-07-05 NOTE — Patient Instructions (Addendum)
 Thank you, Ashley [name] for allowing us  to provide your care today. Today we discussed blacks stools, blood pressure, and shoulder pain.  For the shoulder pain: take tylenol , we sent a referral  for PT, and you can schedule an appointment for a shoulder injection  For the black stool, please see a stomach doctor. Avoid ibuprofen and do NOT take Plavix . We will use aspirin  instead for the stroke.   For the blood pressure: If you feel dizzy or lightheaded, do not take your medicines that day. Please talk to your kidney doctor about this.   I have ordered the following labs for you:   Lab Orders         CBC no Diff         Basic metabolic panel with GFR         POC Hemoccult Bld/Stl (1-Cd Office Dx)      I have ordered the following medication/changed the following medications:   Stop the following medications: Medications Discontinued During This Encounter  Medication Reason   acetaminophen  (TYLENOL ) 325 MG tablet    ondansetron  (ZOFRAN -ODT) 4 MG disintegrating tablet    pantoprazole  (PROTONIX ) 40 MG tablet Reorder   clopidogrel  (PLAVIX ) 75 MG tablet      Start the following medications: Meds ordered this encounter  Medications   pantoprazole  (PROTONIX ) 40 MG tablet    Sig: Take 1 tablet (40 mg total) by mouth daily.    Dispense:  30 tablet    Refill:  2   aspirin  EC 81 MG tablet    Sig: Take 1 tablet (81 mg total) by mouth daily. Swallow whole.    Dispense:  90 tablet    Refill:  3     Follow up: 2-4 weeks for shoulder injection    Remember:   Should you have any questions or concerns please call the internal medicine clinic at [phone].     Please note that our late policy has changed.  If you are more than 15 minutes late to your appointment, you may be asked to reschedule your appointment.  Dr. Kandis, D.O. [facility]

## 2024-07-06 ENCOUNTER — Ambulatory Visit: Payer: Self-pay | Admitting: Student

## 2024-07-06 DIAGNOSIS — K921 Melena: Secondary | ICD-10-CM

## 2024-07-06 LAB — BASIC METABOLIC PANEL WITH GFR
BUN/Creatinine Ratio: 4 — ABNORMAL LOW (ref 12–28)
BUN: 53 mg/dL — ABNORMAL HIGH (ref 8–27)
CO2: 20 mmol/L (ref 20–29)
Calcium: 9.7 mg/dL (ref 8.7–10.3)
Chloride: 94 mmol/L — ABNORMAL LOW (ref 96–106)
Creatinine, Ser: 13.47 mg/dL — ABNORMAL HIGH (ref 0.57–1.00)
Glucose: 83 mg/dL (ref 70–99)
Potassium: 5.9 mmol/L — ABNORMAL HIGH (ref 3.5–5.2)
Sodium: 138 mmol/L (ref 134–144)
eGFR: 3 mL/min/{1.73_m2} — ABNORMAL LOW

## 2024-07-06 LAB — CBC
Hematocrit: 24.7 % — ABNORMAL LOW (ref 34.0–46.6)
Hemoglobin: 8.3 g/dL — ABNORMAL LOW (ref 11.1–15.9)
MCH: 32.4 pg (ref 26.6–33.0)
MCHC: 33.6 g/dL (ref 31.5–35.7)
MCV: 97 fL (ref 79–97)
Platelets: 363 10*3/uL (ref 150–450)
RBC: 2.56 x10E6/uL — CL (ref 3.77–5.28)
RDW: 15.3 % (ref 11.7–15.4)
WBC: 7.2 10*3/uL (ref 3.4–10.8)

## 2024-07-06 NOTE — Progress Notes (Signed)
 Abnormal labs drawn on patient on yesterday were given to Dr. Trudy.  Who will make Dr. Kandis aware of the results this afternoon.

## 2024-07-07 ENCOUNTER — Other Ambulatory Visit: Payer: Self-pay

## 2024-07-07 ENCOUNTER — Emergency Department (HOSPITAL_COMMUNITY)

## 2024-07-07 ENCOUNTER — Emergency Department (HOSPITAL_COMMUNITY)
Admission: EM | Admit: 2024-07-07 | Discharge: 2024-07-08 | Disposition: A | Discharge status: Home / Self Care | Attending: Emergency Medicine | Admitting: Emergency Medicine

## 2024-07-07 ENCOUNTER — Encounter (HOSPITAL_COMMUNITY): Payer: Self-pay

## 2024-07-07 ENCOUNTER — Telehealth: Payer: Self-pay | Admitting: *Deleted

## 2024-07-07 DIAGNOSIS — Z79899 Other long term (current) drug therapy: Secondary | ICD-10-CM | POA: Insufficient documentation

## 2024-07-07 DIAGNOSIS — Z7951 Long term (current) use of inhaled steroids: Secondary | ICD-10-CM | POA: Insufficient documentation

## 2024-07-07 DIAGNOSIS — N186 End stage renal disease: Secondary | ICD-10-CM | POA: Insufficient documentation

## 2024-07-07 DIAGNOSIS — Z7982 Long term (current) use of aspirin: Secondary | ICD-10-CM | POA: Diagnosis not present

## 2024-07-07 DIAGNOSIS — M25552 Pain in left hip: Secondary | ICD-10-CM | POA: Diagnosis not present

## 2024-07-07 DIAGNOSIS — Z992 Dependence on renal dialysis: Secondary | ICD-10-CM

## 2024-07-07 DIAGNOSIS — Y92009 Unspecified place in unspecified non-institutional (private) residence as the place of occurrence of the external cause: Secondary | ICD-10-CM | POA: Insufficient documentation

## 2024-07-07 DIAGNOSIS — J449 Chronic obstructive pulmonary disease, unspecified: Secondary | ICD-10-CM | POA: Insufficient documentation

## 2024-07-07 DIAGNOSIS — M25551 Pain in right hip: Secondary | ICD-10-CM | POA: Insufficient documentation

## 2024-07-07 DIAGNOSIS — W19XXXA Unspecified fall, initial encounter: Secondary | ICD-10-CM | POA: Insufficient documentation

## 2024-07-07 DIAGNOSIS — I120 Hypertensive chronic kidney disease with stage 5 chronic kidney disease or end stage renal disease: Secondary | ICD-10-CM | POA: Insufficient documentation

## 2024-07-07 LAB — CBC WITH DIFFERENTIAL/PLATELET
Abs Immature Granulocytes: 0.05 10*3/uL (ref 0.00–0.07)
Basophils Absolute: 0.1 10*3/uL (ref 0.0–0.1)
Basophils Relative: 1 %
Eosinophils Absolute: 0.2 10*3/uL (ref 0.0–0.5)
Eosinophils Relative: 2 %
HCT: 31.2 % — ABNORMAL LOW (ref 36.0–46.0)
Hemoglobin: 9.9 g/dL — ABNORMAL LOW (ref 12.0–15.0)
Immature Granulocytes: 1 %
Lymphocytes Relative: 18 %
Lymphs Abs: 1.8 10*3/uL (ref 0.7–4.0)
MCH: 31.9 pg (ref 26.0–34.0)
MCHC: 31.7 g/dL (ref 30.0–36.0)
MCV: 100.6 fL — ABNORMAL HIGH (ref 80.0–100.0)
Monocytes Absolute: 1.1 10*3/uL — ABNORMAL HIGH (ref 0.1–1.0)
Monocytes Relative: 12 %
Neutro Abs: 6.5 10*3/uL (ref 1.7–7.7)
Neutrophils Relative %: 66 %
Platelets: 401 10*3/uL — ABNORMAL HIGH (ref 150–400)
RBC: 3.1 MIL/uL — ABNORMAL LOW (ref 3.87–5.11)
RDW: 20.7 % — ABNORMAL HIGH (ref 11.5–15.5)
WBC: 9.7 10*3/uL (ref 4.0–10.5)
nRBC: 0.2 % (ref 0.0–0.2)

## 2024-07-07 LAB — COMPREHENSIVE METABOLIC PANEL WITH GFR
ALT: 9 U/L (ref 0–44)
AST: 22 U/L (ref 15–41)
Albumin: 4.8 g/dL (ref 3.5–5.0)
Alkaline Phosphatase: 54 U/L (ref 38–126)
Anion gap: 18 — ABNORMAL HIGH (ref 5–15)
BUN: 30 mg/dL — ABNORMAL HIGH (ref 8–23)
CO2: 28 mmol/L (ref 22–32)
Calcium: 10.3 mg/dL (ref 8.9–10.3)
Chloride: 97 mmol/L — ABNORMAL LOW (ref 98–111)
Creatinine, Ser: 9.24 mg/dL — ABNORMAL HIGH (ref 0.44–1.00)
GFR, Estimated: 4 mL/min — ABNORMAL LOW
Glucose, Bld: 76 mg/dL (ref 70–99)
Potassium: 4.9 mmol/L (ref 3.5–5.1)
Sodium: 142 mmol/L (ref 135–145)
Total Bilirubin: 0.3 mg/dL (ref 0.0–1.2)
Total Protein: 8.1 g/dL (ref 6.5–8.1)

## 2024-07-07 MED ORDER — ACETAMINOPHEN 325 MG PO TABS
650.0000 mg | ORAL_TABLET | Freq: Once | ORAL | Status: AC
  Administered 2024-07-07: 650 mg via ORAL
  Filled 2024-07-07: qty 2

## 2024-07-07 MED ORDER — OXYCODONE-ACETAMINOPHEN 5-325 MG PO TABS
1.0000 | ORAL_TABLET | Freq: Once | ORAL | Status: AC
  Administered 2024-07-07: 1 via ORAL
  Filled 2024-07-07: qty 1

## 2024-07-07 NOTE — Telephone Encounter (Signed)
 Copied from CRM #[id number]. Topic: Clinical - Order For Equipment >> Jul 07, 2024  1:21 PM Graeme ORN wrote: Reason for CRM: Patient wants to speak with provider about getting a power chair/electric walker. States it is hard for her to ger around. She previously had a walker but wants electric one. Thank You

## 2024-07-07 NOTE — ED Provider Triage Note (Signed)
 Emergency Medicine Provider Triage Evaluation Note  Ashley [name] , a 74 y.o. female  was evaluated in triage.  Pt complains of pain after fall.  Patient states that she was in her laundry room when she had a fall and landed back on her bottom.  Patient states that she did not hit her head, denies loss of consciousness, denies blood thinners.  States that she has pain in her hips bilaterally.  Patient is in no acute distress.  Review of Systems  Positive: pain Negative:   Physical Exam  BP 133/66 (BP Location: Right Arm)   Pulse 74   Temp 98.4 F (36.9 C) (Oral)   Resp 17   Ht 5' 2 (1.575 m)   Wt 48.1 kg   SpO2 94%   BMI 19.39 kg/m  Gen:   Awake, no distress   Resp:  Normal effort  MSK:   Moves extremities without difficulty  Other:    Medical Decision Making  Medically screening exam initiated at 9:08 PM.  Appropriate orders placed.  Ashley [name] was informed that the remainder of the evaluation will be completed by another provider, this initial triage assessment does not replace that evaluation, and the importance of remaining in the ED until their evaluation is complete.    Ashley [name], Ashley [name] 07/07/24 2109

## 2024-07-07 NOTE — Telephone Encounter (Signed)
 Pt called / informed needs an appt to discuss power w/c; stated she does not want to wait until 07/21/24. Appt scheduled 2/3 with Dr Heddy.

## 2024-07-07 NOTE — ED Notes (Signed)
 Patient transported to X-ray

## 2024-07-07 NOTE — Telephone Encounter (Signed)
 Called pt to schedule an appt for w/c if it was not discussed at LOV on 1/27. No answer; mailbox has not been set up, unable to leave a message.

## 2024-07-07 NOTE — ED Triage Notes (Signed)
 PT BIB [facility] due to a fall at home where pt landed on her tailbone. Pt states her walker rolled away and then she lost her balance. Pt denies LOC, hitting her head, and no blood thinners.   Dialysis pt: M,W,F.  Missed Monday, but did go Wednesday   EMS vitals  130/60  76 HR  16 rr  98% spo02 room air   CBG 107

## 2024-07-07 NOTE — Telephone Encounter (Signed)
 I see follow-up with Dr. Benuel on 07/21/24 at which point they can discuss power wheelchair.

## 2024-07-08 ENCOUNTER — Emergency Department (HOSPITAL_COMMUNITY)

## 2024-07-08 NOTE — ED Notes (Signed)
 PT is lying comfortbaly

## 2024-07-08 NOTE — ED Notes (Signed)
Taxi voucher given 

## 2024-07-08 NOTE — Progress Notes (Addendum)
 Contacted by nephrologist and ED CSW with request for out-pt HD appt today. Contacted [facility] to inquire if clinic can treat pt later today. Clinic can treat pt today if pt arrives by 11:00 am. This info was provided to nephrologist and ED CSW. Clinic aware to expect pt by 11:00 am unless navigator hears something different from staff.   Randine Mungo Dialysis Navigator [phone]  Addendum at 9:03 am: HD appt for today added to pt's AVS.   Addendum at 9:57 am: Clinic advised that pt plans to go home first then come to HD. Clinic agreeable to treat pt today if pt arrives by 12:00 pm today. ED staff made aware of this info.

## 2024-07-08 NOTE — ED Notes (Signed)
 Patient transported to CT

## 2024-07-08 NOTE — ED Provider Notes (Signed)
 Signout from [employer], PA-C at shift change. Briefly, patient presents for  a fall. Complaining of tailbone pain.    Plan: Patient is scheduled nonemergently for inpatient dialysis today.  Case management  to see if dialysis outpatient can be arranged today as she missed appointment this morning.    7:04 AM Reassessment performed. Patient appears resting comfortably in no acute distress  Labs and imaging personally reviewed and interpreted including: Workup reassuring including labs stable compared to baseline and no acute abnormalities on imaging      Most current vital signs reviewed and are as follows: BP 139/64   Pulse 81   Temp 98 F (36.7 C)   Resp 18   Ht 5' 2 (1.575 m)   Wt 48.1 kg   SpO2 91%   BMI 19.39 kg/m     Plan: Case management evaluated patient who arranged an 11 AM chair time for hemodialysis outpatient.  Patient provided a taxi voucher and discharged in stable condition.         Neysa Thersia RAMAN, PA-C 07/08/24 1028    Towana Ozell BROCKS, MD 07/08/24 [phone]

## 2024-07-08 NOTE — ED Provider Notes (Signed)
 " [place] EMERGENCY DEPARTMENT AT [facility] Provider Note   CSN: [id number] Arrival date & time: 07/07/24  2033     Patient presents with: Ashley [name]   Ashley [name] is a 74 y.o. female.   74 year old female with a history of hypertension, hyperlipidemia, CVA, COPD, ESRD on HD (TThS), and illicit drug use presents to the emergency department after a fall at home.  She states that she was in the basement tending to her laundry.  She uses a walker at baseline and went to sit on the seat of her walker when it moved from under her and she fell on her bottom.  She denies any head trauma or LOC.  Complains of pain to her bilateral hips.  She is not on chronic anticoagulation.  No medications taken prior to arrival for pain.  The history is provided by the patient. No language interpreter was used.  Fall       Prior to Admission medications  Medication Sig Start Date End Date Taking? Authorizing Provider  albuterol  (VENTOLIN  HFA) 108 (90 Base) MCG/ACT inhaler Inhale 2 puffs into the lungs every 6 (six) hours as needed for wheezing or shortness of breath. 06/20/23   Jillian Buttery, MD  amLODipine  (NORVASC ) 10 MG tablet Take 1 tablet (10 mg total) by mouth daily. Patient taking differently: Take 10 mg by mouth at bedtime. 11/11/16   Phelps, Jazma Y, DO  aspirin  EC 81 MG tablet Take 1 tablet (81 mg total) by mouth daily. Swallow whole. 07/05/24 07/05/25  Kandis Perkins, DO  atorvastatin  (LIPITOR) 40 MG tablet Take 1 tablet (40 mg total) by mouth daily. 01/01/24 12/31/24  Renne Homans, MD  CREON  12000-38000 units CPEP capsule Take 2-4 capsules by mouth See admin instructions. Take 4 capsule by mouth three times a day with meals and take 2 capsule by mouth with snacks 03/02/23   [provider]  diclofenac  Sodium (VOLTAREN ) 1 % GEL Apply 4 g topically 4 (four) times daily. Patient taking differently: Apply 4 g topically daily as needed (for pain). 01/12/24   Rihner, Emilie, DO   dicyclomine  (BENTYL ) 20 MG tablet Take 1 tablet (20 mg total) by mouth 2 (two) times daily. 01/05/24   Kommor, Madison, MD  Fluticasone-Umeclidin-Vilant (TRELEGY ELLIPTA ) 100-62.5-25 MCG/ACT AEPB Inhale 1 puff into the lungs daily. 10/20/23   Darlean Ozell NOVAK, MD  folic acid -vitamin b complex-vitamin c-selenium-zinc  (DIALYVITE ) 3 MG TABS tablet Take 1 tablet by mouth daily.    [provider]  gabapentin  (NEURONTIN ) 100 MG capsule Take 2 capsules (200 mg total) by mouth at bedtime. 06/20/23   Jillian Buttery, MD  hydrALAZINE  (APRESOLINE ) 25 MG tablet Take 3 tablets (75 mg total) by mouth every 8 (eight) hours. 11/14/20 03/09/24  [place], Eric J, DO  hydrocortisone  cream 1 % Apply to affected area 2 times daily 01/28/24 01/27/25  Rihner, Emilie, DO  ipratropium-albuterol  (DUONEB) 0.5-2.5 (3) MG/3ML SOLN Take 3 mLs by nebulization 3 (three) times daily. Patient taking differently: Take 3 mLs by nebulization every 6 (six) hours as needed (For shortness of breath). 06/20/23   Jillian Buttery, MD  lidocaine  (LIDODERM ) 5 % Place 1 patch onto the skin daily as needed (for pain). 01/11/24   [provider]  pantoprazole  (PROTONIX ) 40 MG tablet Take 1 tablet (40 mg total) by mouth daily. 07/05/24 10/03/24  Kandis Perkins, DO  sevelamer  carbonate (RENVELA ) 800 MG tablet Take 1 tablet (800 mg total) by mouth 3 (three) times daily with meals. 02/02/24  Edgardo Pontiff, DO    Allergies: Aspirin  and Ibuprofen    Review of Systems Ten systems reviewed and are negative for acute change, except as noted in the HPI.    Updated Vital Signs BP 139/64   Pulse 81   Temp 98 F (36.7 C)   Resp 18   Ht 5' 2 (1.575 m)   Wt 48.1 kg   SpO2 91%   BMI 19.39 kg/m   Physical Exam Vitals and nursing note reviewed.  Constitutional:      General: She is not in acute distress.    Appearance: She is well-developed. She is not diaphoretic.     Comments: Elderly female, in NAD  HENT:     Head: Normocephalic and  atraumatic.     Comments: No skull instability or palpable hematoma.  No Battle sign or raccoon's eyes. Eyes:     General: No scleral icterus.    Conjunctiva/sclera: Conjunctivae normal.  Cardiovascular:     Rate and Rhythm: Normal rate and regular rhythm.     Pulses: Normal pulses.  Pulmonary:     Effort: Pulmonary effort is normal. No respiratory distress.     Comments: Respirations even and unlabored Musculoskeletal:        General: Normal range of motion.     Cervical back: Normal range of motion.     Comments: TTP to the lower lumbosacral midline/superior gluteal cleft. No crepitus. BLE symmetric; no shortening or malrotation.  Skin:    General: Skin is warm and dry.     Coloration: Skin is not pale.     Findings: No erythema or rash.     Comments: No ecchymosis or abrasion noted to trunk, back, or extremities  Neurological:     Mental Status: She is alert and oriented to person, place, and time.     Coordination: Coordination normal.     Comments: GCS 15. Speech is goal oriented. Patient has equal grip strength bilaterally with 5/5 strength against resistance in all major muscle groups bilaterally. Sensation to light touch intact. Patient moves extremities without ataxia.   Psychiatric:        Behavior: Behavior normal.     (all labs ordered are listed, but only abnormal results are displayed) Labs Reviewed  CBC WITH DIFFERENTIAL/PLATELET - Abnormal; Notable for the following components:      Result Value   RBC 3.10 (*)    Hemoglobin 9.9 (*)    HCT 31.2 (*)    MCV 100.6 (*)    RDW 20.7 (*)    Platelets 401 (*)    Monocytes Absolute 1.1 (*)    All other components within normal limits  COMPREHENSIVE METABOLIC PANEL WITH GFR - Abnormal; Notable for the following components:   Chloride 97 (*)    BUN 30 (*)    Creatinine, Ser 9.24 (*)    GFR, Estimated 4 (*)    Anion gap 18 (*)    All other components within normal limits    EKG: EKG  Interpretation Date/Time:  Thursday July 07 2024 21:22:59 EST Ventricular Rate:  87 PR Interval:  132 QRS Duration:  86 QT Interval:  350 QTC Calculation: 421 R Axis:   38  Text Interpretation: Normal sinus rhythm Nonspecific ST abnormality Abnormal ECG Interpretation limited secondary to artifact Confirmed by Midge Golas ([id number]) on 07/08/2024 3:17:54 AM  Radiology: CT PELVIS WO CONTRAST Result Date: 07/08/2024 EXAM: CT Pelvis, Without IV Contrast 07/08/2024 04:37:42 AM TECHNIQUE: Axial images were acquired through the pelvis  without IV contrast. Reformatted images were reviewed. Automated exposure control, iterative reconstruction, and/or weight based adjustment of the mA/kV was utilized to reduce the radiation dose to as low as reasonably achievable. COMPARISON: Sacrococcygeal series from yesterday, CT abdomen and pelvis 01/05/2024. CLINICAL HISTORY: Low back pain; ?sacral fx. Recent fall. Questionable lucency through the lower sacral region on the lateral view of yesterday's sacrococcygeal series. FINDINGS: BONES: Osteopenia. No acute fracture or focal osseous lesion. The pelvic bones , L5, sacrum, coccyx and proximal femurs otherwise unremarkable. No focal pathologic bone lesion is seen. JOINTS: Mild degenerative features of the hip joints, SI joints, and symphysis pubis. L5-S1 discogenic degenerative arthrosis and spondylosis, with vacuum phenomenon in the disc space. No dislocation. The joint spaces are normal. SOFT TISSUES: Stranding in the superficial soft tissues consistent with body wall anasarca. Extensive aortoiliac calcific plaque. INTRAPELVIC CONTENTS: The uterus is surgically absent. No adnexal mass is seen. Multiple pelvic phleboliths are present. The bladder is contracted and not well seen. There are no inflammatory changes around it. There is no pelvic free fluid, free hemorrhage, or mass. IMPRESSION: 1. No acute fracture or other acute osseous abnormality in the bony pelvis,  L5, sacrum, coccyx, or proximal femurs. 2. Body wall anasarca. 3. Heavy aortoiliac calcific plaques. 4. Osteopenia and degenerative change. Electronically signed by: Francis Quam MD 07/08/2024 05:24 AM EST RP Workstation: [id number]   DG Pelvis 1-2 Views Result Date: 07/07/2024 EXAM: 1-2 VIEW(S) XRAY OF THE PELVIS 07/07/2024 09:54:00 PM COMPARISON: 12/18/2023 CLINICAL HISTORY: Pain. FINDINGS: BONES AND JOINTS: No acute fracture. No malalignment. Pubic symphysis degenerative changes. Mild bilateral hip degenerative changes. SOFT TISSUES: Vascular calcifications. IMPRESSION: 1. No acute abnormality. Electronically signed by: Morgane Naveau MD 07/07/2024 10:01 PM EST RP Workstation: [id number]   DG Sacrum/Coccyx Result Date: 07/07/2024 EXAM: 2 VIEW(S) XRAY OF THE SACRUM AND COCCYX 07/07/2024 09:53:00 PM COMPARISON: X-ray pelvis 07/07/2024 and CT abdomen and pelvis 01/05/2024. CLINICAL HISTORY: pain Pain. FINDINGS: BONES AND JOINTS: Vague lucency through the lower sacral region spine on lateral view with markedly limited evaluation due to overlapping osseous structures and overlying soft tissues. Degenerative changes of the pubic symphysis. No pelvic diastasis. No acute fracture. No malalignment. SOFT TISSUES: Unremarkable. IMPRESSION: 1. Vague lucency through the lower sacral region on lateral view, with markedly limited evaluation due to overlapping osseous structures and overlying soft tissues. Electronically signed by: Morgane Naveau MD 07/07/2024 10:01 PM EST RP Workstation: [id number]     Procedures   Medications Ordered in the ED  acetaminophen  (TYLENOL ) tablet 650 mg (650 mg Oral Given 07/07/24 2204)  oxyCODONE -acetaminophen  (PERCOCET/ROXICET) 5-325 MG per tablet 1 tablet (1 tablet Oral Given 07/07/24 2225)    Clinical Course as of 07/08/24 0629  Fri Jul 08, 2024  0535 CT imaging negative for acute fracture.  Specifically, no sacral fracture noted. Will trial ambulation with plan to discharge  if successful. [KH]  0556 Ambulated with nursing staff without issue. [KH]  [phone] Nursing called daughter to pick up patient who reported that patient was scheduled to start dialysis at 5 AM and they would not except her to her center if she was late.  Daughter requesting that patient remain in the department to receive dialysis prior to discharge.  Will consult with nephrology to try and coordinate this. [KH]  9377 Spoke with Dr. Larrazola of nephrology who will coordinate for patient to not emergently dialyze later on today.  Does request that care management/social work be engaged to try and set up a time for patient  to be dialyzed at her normal center later today rather than wait for inpatient dialysis-to-discharge. [KH]    Clinical Course User Index [KH] Keith Sor, PA-C                                 Medical Decision Making Amount and/or Complexity of Data Reviewed Radiology: ordered.   This patient presents to the ED for concern of fall, this involves an extensive number of treatment options, and is a complaint that carries with it a high risk of complications and morbidity.  The differential diagnosis includes bony fracture vs contusion vs joint dislocation vs central cord compression   Co morbidities that complicate the patient evaluation  HTN HLD CVA ESRD   Additional history obtained:  Additional history obtained from EMS personnel External records from outside source obtained and reviewed including prior discharge summaries   Lab Tests:  I Ordered, and personally interpreted labs.  The pertinent results include:  Hgb 9.9 (improved from 8.3 on 07/05/24), BUN 30/Creatinine 9.24 c/w known ESRD   Imaging Studies ordered:  I ordered imaging studies including Xray pelvis and sacrum/coccyx as well as CT pelvis  I independently visualized and interpreted imaging which showed no evidence of fracture, dislocation, or other bony deformity I agree with the radiologist  interpretation   Cardiac Monitoring:  The patient was maintained on a cardiac monitor.  I personally viewed and interpreted the cardiac monitored which showed an underlying rhythm of: NSR   Medicines ordered and prescription drug management:  I ordered medication including Oxycodone  for pain  Reevaluation of the patient after these medicines showed that the patient improved I have reviewed the patients home medicines and have made adjustments as needed   Test Considered:  UDS   Consultations Obtained:  I requested consultation with Dr. Maybell of nephrology and discussed barriers to outpatient dialysis this AM - they will coordinate dialysis for patient later today   Problem List / ED Course:  As above   Reevaluation:  After the interventions noted above, I reevaluated the patient and found that they have :stayed the same   Social Determinants of Health:  Lives independently    Dispostion:  Care signed out to [place], PA-C at shift change.      Final diagnoses:  Fall, initial encounter  ESRD (end stage renal disease) on dialysis [facility])    ED Discharge Orders     None          Keith Sor, PA-C 07/08/24 9366    Midge Golas, MD 07/08/24 [phone]  "

## 2024-07-08 NOTE — Discharge Instructions (Signed)
 Your workup was reassuring and did not show evidence of any broken bone from your fall.  Take Tylenol  or ibuprofen as needed for pain.  Follow-up with your primary care doctor for reassessment.

## 2024-07-08 NOTE — ED Notes (Signed)
 Pt ambulated with walker and standby assist, made it approximately 34ft.

## 2024-07-09 NOTE — Progress Notes (Signed)
 Internal Medicine Clinic Attending  Case discussed with the resident at the time of the visit.  We reviewed the resident's history and exam and pertinent patient test results.  I agree with the assessment, diagnosis, and plan of care documented in the resident's note.

## 2024-07-12 ENCOUNTER — Ambulatory Visit: Payer: Self-pay | Admitting: Student

## 2024-07-12 VITALS — BP 169/57 | HR 61 | Ht 62.0 in | Wt 104.8 lb

## 2024-07-12 DIAGNOSIS — R52 Pain, unspecified: Secondary | ICD-10-CM

## 2024-07-12 DIAGNOSIS — Z992 Dependence on renal dialysis: Secondary | ICD-10-CM

## 2024-07-12 DIAGNOSIS — Z9181 History of falling: Secondary | ICD-10-CM

## 2024-07-12 DIAGNOSIS — W1839XA Other fall on same level, initial encounter: Secondary | ICD-10-CM | POA: Diagnosis not present

## 2024-07-12 DIAGNOSIS — I120 Hypertensive chronic kidney disease with stage 5 chronic kidney disease or end stage renal disease: Secondary | ICD-10-CM | POA: Diagnosis not present

## 2024-07-12 DIAGNOSIS — N186 End stage renal disease: Secondary | ICD-10-CM | POA: Diagnosis not present

## 2024-07-12 DIAGNOSIS — W19XXXA Unspecified fall, initial encounter: Secondary | ICD-10-CM

## 2024-07-12 DIAGNOSIS — K921 Melena: Secondary | ICD-10-CM

## 2024-07-12 DIAGNOSIS — I10 Essential (primary) hypertension: Secondary | ICD-10-CM

## 2024-07-12 DIAGNOSIS — G5793 Unspecified mononeuropathy of bilateral lower limbs: Secondary | ICD-10-CM | POA: Diagnosis not present

## 2024-07-12 MED ORDER — LIDOCAINE 5 % EX PTCH: 1.0000 | MEDICATED_PATCH | Freq: Every day | CUTANEOUS | 0 refills | Status: AC | PRN

## 2024-07-12 MED ORDER — OXYCODONE-ACETAMINOPHEN 5-325 MG PO TABS: 1.0000 | ORAL_TABLET | Freq: Three times a day (TID) | ORAL | 0 refills | Status: DC | PRN

## 2024-07-12 MED ORDER — OXYCODONE-ACETAMINOPHEN 5-325 MG PO TABS: 1.0000 | ORAL_TABLET | Freq: Three times a day (TID) | ORAL | 0 refills | Status: AC | PRN

## 2024-07-12 NOTE — Progress Notes (Signed)
 Internal Medicine Clinic Attending  Case discussed with the resident at the time of the visit.  We reviewed the resident's history and exam and pertinent patient test results.  I agree with the assessment, diagnosis, and plan of care documented in the resident's note.

## 2024-07-12 NOTE — Assessment & Plan Note (Addendum)
 Dressing: tough time with dressing herself   Eating: Can feed herself, but hard to fix food, goes for easy meals like can foods  Ambulation: Rolator, reports that she is unable to balance herself, has had about two falls in the past month Toileting: has hard time Hygiene: Wash up at the sink; afraid that she will fall in the shower  Shopping, housekeeping/house maintenance, accounting, food preparation, telephone/transportation (iADLS): - Daughter takes her to shopping where she uses a scooter  - She can barely maintain and do housekeeping  - Handles her own finance  - [place] transportation   Currently using: Rolator with a bench; reports several falls. Reports she needs something that she can sit and ride.She currently lives on second floor and has to go downstairs which is less than ideal given the patient's fall risk. Based on patient's evaluation and fall risk, she would benefit from an electric wheelchair. - DME electric wheelchair is placed - Referral for Neuro Rehab is placed for PWC evaluation

## 2024-07-12 NOTE — Assessment & Plan Note (Addendum)
 Elevated; likely related to pain.

## 2024-07-13 ENCOUNTER — Telehealth: Payer: Self-pay

## 2024-07-13 NOTE — Telephone Encounter (Signed)
 Patient Name: Ashley [name] Patient DOB: 1950-10-13 Patient ID: [id number] Status of Request: Deny Medication Name: Lidocaine  Dis 5% Patch GPI/NDC: [id number] Decision Notes: Medicare allows us  to cover a drug only when it is a Part D drug. A Part D drug is one that is used for a medically accepted indication. A medically accepted indication means the use is approved by the Food and Drug Administration (FDA) OR the use is supported by one of the following accepted references: (1) [facility] Formulary Service Drug Information. (2) [employer] DRUGDEX Information System. Lidocaine  Dis 5% Patch is not FDA approved for your medical condition(s): History of falling. These condition(s) are not supported by one of the accepted references. Therefore, your drug is denied because it is not being used for a medically accepted indication. Reviewed by: Rph   Patient can get lidocaine  patches OTC. I called the patient to let her know the status of the pa. I was unable to reach the patient. Voicemail is not set up.

## 2024-07-13 NOTE — Telephone Encounter (Signed)
 Prior Authorization for patient (Lidocaine  5% patches) came through on cover my meds was submitted with last office notes awaiting approval or denial.  XZB:[id number]

## 2024-07-14 ENCOUNTER — Ambulatory Visit

## 2024-07-14 ENCOUNTER — Telehealth: Payer: Self-pay

## 2024-07-14 NOTE — Telephone Encounter (Signed)
 Patient was contacted via phone. She stated that she is still too sore from her recent fall to come to evaluation today. PT re-stated no policy and encouraged patient to reschedule when she is feeling better. Patient agreed.

## 2024-07-21 ENCOUNTER — Ambulatory Visit: Payer: Self-pay

## 2024-07-26 ENCOUNTER — Ambulatory Visit: Admitting: Physical Therapy

## 2024-07-29 ENCOUNTER — Ambulatory Visit (HOSPITAL_COMMUNITY)
